# Patient Record
Sex: Female | Born: 1965 | Race: White | Hispanic: No | Smoking: Current every day smoker
Health system: Southern US, Community
[De-identification: ages and names within clinical notes are randomized; demographics above are authoritative.]

## PROBLEM LIST (undated history)

## (undated) DIAGNOSIS — F0280 Dementia in other diseases classified elsewhere without behavioral disturbance: Secondary | ICD-10-CM

## (undated) DIAGNOSIS — J302 Other seasonal allergic rhinitis: Secondary | ICD-10-CM

## (undated) DIAGNOSIS — I10 Essential (primary) hypertension: Secondary | ICD-10-CM

## (undated) DIAGNOSIS — N189 Chronic kidney disease, unspecified: Secondary | ICD-10-CM

## (undated) DIAGNOSIS — F419 Anxiety disorder, unspecified: Secondary | ICD-10-CM

## (undated) DIAGNOSIS — R0600 Dyspnea, unspecified: Secondary | ICD-10-CM

## (undated) DIAGNOSIS — E785 Hyperlipidemia, unspecified: Secondary | ICD-10-CM

## (undated) DIAGNOSIS — F319 Bipolar disorder, unspecified: Secondary | ICD-10-CM

## (undated) DIAGNOSIS — F329 Major depressive disorder, single episode, unspecified: Secondary | ICD-10-CM

## (undated) DIAGNOSIS — E119 Type 2 diabetes mellitus without complications: Secondary | ICD-10-CM

## (undated) DIAGNOSIS — C801 Malignant (primary) neoplasm, unspecified: Secondary | ICD-10-CM

## (undated) DIAGNOSIS — J45909 Unspecified asthma, uncomplicated: Secondary | ICD-10-CM

## (undated) DIAGNOSIS — F32A Depression, unspecified: Secondary | ICD-10-CM

## (undated) DIAGNOSIS — U071 COVID-19: Secondary | ICD-10-CM

## (undated) DIAGNOSIS — G912 (Idiopathic) normal pressure hydrocephalus: Secondary | ICD-10-CM

## (undated) HISTORY — PX: BREAST EXCISIONAL BIOPSY: SUR124

## (undated) HISTORY — PX: BREAST SURGERY: SHX581

---

## 1998-05-16 ENCOUNTER — Other Ambulatory Visit: Admission: RE | Admit: 1998-05-16 | Discharge: 1998-05-16 | Payer: Self-pay | Admitting: Obstetrics & Gynecology

## 1999-05-01 ENCOUNTER — Other Ambulatory Visit: Admission: RE | Admit: 1999-05-01 | Discharge: 1999-05-01 | Payer: Self-pay | Admitting: Obstetrics & Gynecology

## 2001-08-16 ENCOUNTER — Emergency Department (HOSPITAL_COMMUNITY): Admission: EM | Admit: 2001-08-16 | Discharge: 2001-08-16 | Payer: Self-pay | Admitting: *Deleted

## 2002-01-06 ENCOUNTER — Encounter: Payer: Self-pay | Admitting: Emergency Medicine

## 2002-01-06 ENCOUNTER — Emergency Department (HOSPITAL_COMMUNITY): Admission: EM | Admit: 2002-01-06 | Discharge: 2002-01-06 | Payer: Self-pay | Admitting: Emergency Medicine

## 2002-02-16 ENCOUNTER — Emergency Department (HOSPITAL_COMMUNITY): Admission: EM | Admit: 2002-02-16 | Discharge: 2002-02-16 | Payer: Self-pay | Admitting: Emergency Medicine

## 2002-02-16 ENCOUNTER — Encounter: Payer: Self-pay | Admitting: Emergency Medicine

## 2002-02-24 ENCOUNTER — Encounter: Payer: Self-pay | Admitting: Emergency Medicine

## 2002-02-24 ENCOUNTER — Emergency Department (HOSPITAL_COMMUNITY): Admission: EM | Admit: 2002-02-24 | Discharge: 2002-02-24 | Payer: Self-pay | Admitting: Emergency Medicine

## 2002-12-18 ENCOUNTER — Emergency Department (HOSPITAL_COMMUNITY): Admission: EM | Admit: 2002-12-18 | Discharge: 2002-12-18 | Payer: Self-pay | Admitting: Emergency Medicine

## 2003-07-12 ENCOUNTER — Emergency Department (HOSPITAL_COMMUNITY): Admission: EM | Admit: 2003-07-12 | Discharge: 2003-07-12 | Payer: Self-pay | Admitting: Emergency Medicine

## 2004-05-14 ENCOUNTER — Emergency Department (HOSPITAL_COMMUNITY): Admission: EM | Admit: 2004-05-14 | Discharge: 2004-05-14 | Payer: Self-pay | Admitting: Emergency Medicine

## 2004-08-27 ENCOUNTER — Emergency Department (HOSPITAL_COMMUNITY): Admission: EM | Admit: 2004-08-27 | Discharge: 2004-08-27 | Payer: Self-pay | Admitting: Emergency Medicine

## 2004-10-23 ENCOUNTER — Emergency Department (HOSPITAL_COMMUNITY): Admission: EM | Admit: 2004-10-23 | Discharge: 2004-10-23 | Payer: Self-pay | Admitting: Family Medicine

## 2004-10-31 ENCOUNTER — Other Ambulatory Visit: Admission: RE | Admit: 2004-10-31 | Discharge: 2004-10-31 | Payer: Self-pay | Admitting: Obstetrics & Gynecology

## 2004-11-06 ENCOUNTER — Encounter: Admission: RE | Admit: 2004-11-06 | Discharge: 2004-11-06 | Payer: Self-pay | Admitting: Obstetrics & Gynecology

## 2004-11-12 ENCOUNTER — Encounter: Admission: RE | Admit: 2004-11-12 | Discharge: 2004-11-12 | Payer: Self-pay | Admitting: Obstetrics & Gynecology

## 2005-08-12 ENCOUNTER — Other Ambulatory Visit: Admission: RE | Admit: 2005-08-12 | Discharge: 2005-08-12 | Payer: Self-pay | Admitting: Obstetrics & Gynecology

## 2005-08-13 ENCOUNTER — Encounter: Admission: RE | Admit: 2005-08-13 | Discharge: 2005-08-13 | Payer: Self-pay | Admitting: Obstetrics & Gynecology

## 2005-08-13 ENCOUNTER — Encounter: Admission: RE | Admit: 2005-08-13 | Discharge: 2005-08-13 | Payer: Self-pay | Admitting: Allergy and Immunology

## 2006-01-05 ENCOUNTER — Encounter: Admission: RE | Admit: 2006-01-05 | Discharge: 2006-01-05 | Payer: Self-pay | Admitting: Obstetrics & Gynecology

## 2006-01-14 ENCOUNTER — Emergency Department (HOSPITAL_COMMUNITY): Admission: EM | Admit: 2006-01-14 | Discharge: 2006-01-15 | Payer: Self-pay | Admitting: Emergency Medicine

## 2006-04-28 ENCOUNTER — Encounter: Admission: RE | Admit: 2006-04-28 | Discharge: 2006-04-28 | Payer: Self-pay | Admitting: Orthopedic Surgery

## 2006-05-20 ENCOUNTER — Encounter: Admission: RE | Admit: 2006-05-20 | Discharge: 2006-05-20 | Payer: Self-pay | Admitting: Orthopedic Surgery

## 2006-08-19 ENCOUNTER — Encounter: Admission: RE | Admit: 2006-08-19 | Discharge: 2006-08-19 | Payer: Self-pay | Admitting: *Deleted

## 2007-02-10 ENCOUNTER — Encounter: Admission: RE | Admit: 2007-02-10 | Discharge: 2007-02-10 | Payer: Self-pay | Admitting: Obstetrics & Gynecology

## 2007-07-27 ENCOUNTER — Emergency Department (HOSPITAL_COMMUNITY): Admission: EM | Admit: 2007-07-27 | Discharge: 2007-07-27 | Payer: Self-pay | Admitting: Emergency Medicine

## 2008-02-13 ENCOUNTER — Encounter: Admission: RE | Admit: 2008-02-13 | Discharge: 2008-02-13 | Payer: Self-pay | Admitting: Obstetrics & Gynecology

## 2008-04-18 ENCOUNTER — Encounter: Admission: RE | Admit: 2008-04-18 | Discharge: 2008-05-29 | Payer: Self-pay | Admitting: Endocrinology

## 2009-04-05 ENCOUNTER — Ambulatory Visit (HOSPITAL_COMMUNITY): Admission: RE | Admit: 2009-04-05 | Discharge: 2009-04-05 | Payer: Self-pay | Admitting: Obstetrics & Gynecology

## 2010-09-28 ENCOUNTER — Encounter: Payer: Self-pay | Admitting: Obstetrics & Gynecology

## 2010-12-18 ENCOUNTER — Other Ambulatory Visit: Payer: Self-pay | Admitting: *Deleted

## 2010-12-18 NOTE — Telephone Encounter (Signed)
Not a [facility] pt to return request to the [facility].

## 2012-03-09 ENCOUNTER — Other Ambulatory Visit: Payer: Self-pay | Admitting: *Deleted

## 2012-03-09 NOTE — Telephone Encounter (Signed)
Not an [facility] pt

## 2012-10-03 ENCOUNTER — Other Ambulatory Visit: Payer: Self-pay | Admitting: Obstetrics & Gynecology

## 2012-10-03 DIAGNOSIS — R928 Other abnormal and inconclusive findings on diagnostic imaging of breast: Secondary | ICD-10-CM

## 2012-10-13 ENCOUNTER — Ambulatory Visit
Admission: RE | Admit: 2012-10-13 | Discharge: 2012-10-13 | Disposition: A | Discharge status: Home / Self Care | Payer: BC Managed Care – PPO | Source: Ambulatory Visit | Attending: Obstetrics & Gynecology | Admitting: Obstetrics & Gynecology

## 2012-10-13 DIAGNOSIS — R928 Other abnormal and inconclusive findings on diagnostic imaging of breast: Secondary | ICD-10-CM

## 2013-03-29 ENCOUNTER — Emergency Department (HOSPITAL_COMMUNITY): Payer: BC Managed Care – PPO

## 2013-03-29 ENCOUNTER — Emergency Department (HOSPITAL_COMMUNITY)
Admission: EM | Admit: 2013-03-29 | Discharge: 2013-03-29 | Disposition: A | Discharge status: Home / Self Care | Payer: BC Managed Care – PPO | Attending: Emergency Medicine | Admitting: Emergency Medicine

## 2013-03-29 ENCOUNTER — Encounter (HOSPITAL_COMMUNITY): Payer: Self-pay | Admitting: Emergency Medicine

## 2013-03-29 DIAGNOSIS — F411 Generalized anxiety disorder: Secondary | ICD-10-CM | POA: Insufficient documentation

## 2013-03-29 DIAGNOSIS — F17200 Nicotine dependence, unspecified, uncomplicated: Secondary | ICD-10-CM | POA: Insufficient documentation

## 2013-03-29 DIAGNOSIS — R0602 Shortness of breath: Secondary | ICD-10-CM | POA: Insufficient documentation

## 2013-03-29 DIAGNOSIS — Z794 Long term (current) use of insulin: Secondary | ICD-10-CM | POA: Insufficient documentation

## 2013-03-29 DIAGNOSIS — E119 Type 2 diabetes mellitus without complications: Secondary | ICD-10-CM | POA: Insufficient documentation

## 2013-03-29 DIAGNOSIS — Z79899 Other long term (current) drug therapy: Secondary | ICD-10-CM | POA: Insufficient documentation

## 2013-03-29 DIAGNOSIS — R05 Cough: Secondary | ICD-10-CM | POA: Insufficient documentation

## 2013-03-29 DIAGNOSIS — R059 Cough, unspecified: Secondary | ICD-10-CM | POA: Insufficient documentation

## 2013-03-29 DIAGNOSIS — J4521 Mild intermittent asthma with (acute) exacerbation: Secondary | ICD-10-CM

## 2013-03-29 DIAGNOSIS — J45901 Unspecified asthma with (acute) exacerbation: Secondary | ICD-10-CM | POA: Insufficient documentation

## 2013-03-29 HISTORY — DX: Type 2 diabetes mellitus without complications: E11.9

## 2013-03-29 HISTORY — DX: Unspecified asthma, uncomplicated: J45.909

## 2013-03-29 HISTORY — DX: Other seasonal allergic rhinitis: J30.2

## 2013-03-29 MED ORDER — ALBUTEROL SULFATE HFA 108 (90 BASE) MCG/ACT IN AERS: 2.0000 | INHALATION_SPRAY | RESPIRATORY_TRACT | Status: DC | PRN

## 2013-03-29 MED ORDER — ALBUTEROL SULFATE (5 MG/ML) 0.5% IN NEBU
5.0000 mg | INHALATION_SOLUTION | Freq: Once | RESPIRATORY_TRACT | Status: AC
  Administered 2013-03-29: 5 mg via RESPIRATORY_TRACT
  Filled 2013-03-29: qty 1

## 2013-03-29 MED ORDER — ALBUTEROL SULFATE HFA 108 (90 BASE) MCG/ACT IN AERS
2.0000 | INHALATION_SPRAY | Freq: Once | RESPIRATORY_TRACT | Status: AC
  Administered 2013-03-29: 2 via RESPIRATORY_TRACT
  Filled 2013-03-29: qty 6.7

## 2013-03-29 NOTE — Discharge Instructions (Signed)
1. Take your inhaler as prescribed. 2. Follow up with Dr. Ronne Binning 3. SEEK IMMEDIATE MEDICAL CARE IF:  You are getting worse.  You have trouble breathing. If severe, call your local emergency services 911 in [place]  You develop chest pain or discomfort.  You are throwing up or not drinking fluids.  You are coughing up yellow, green, brown, or bloody sputum.  You have a fever or persistent symptoms for more than 2 3 days.  You have a fever and your symptoms suddenly get worse.  You have trouble swallowing.

## 2013-03-29 NOTE — ED Notes (Signed)
PT. REPORTS ASTHMA ATTACK ONSET 2 DAYS AGO AFTER RUNNING OUT OF ALBUTEROL MDI / OCCASIONAL PRODUCTIIVE COUGH.

## 2013-03-30 NOTE — ED Provider Notes (Signed)
CSN: [id number]     Arrival date & time 03/29/13  1934 History     First MD Initiated Contact with Patient 03/29/13 2113     Chief Complaint  Patient presents with  . Asthma   (Consider location/radiation/quality/duration/timing/severity/associated sxs/prior Treatment) HPI Comments: 47 y.o. Female with PMHx of asthma and DM2 presents today complaining about exacerbation of her asthma. This is a chronic problem for the pt.  Acute onset 2 days ago. Intermittent. Worse today after running out of her inhaler. Pt does have an appt with PCP on Friday for refills, but could not make it till then. Pt states this asthma attack as moderate. Was given albuterol tx prior to PE and states she is feeling much better.   Patient is a 47 y.o. female presenting with asthma.  Asthma Associated symptoms include coughing. Pertinent negatives include no arthralgias, change in bowel habit, chest pain, chills, diaphoresis, fever, headaches, myalgias, nausea, sore throat, urinary symptoms, visual change or vomiting. Exacerbated by: anxiety over running out of her inhaler. Treatments tried: inhaler but ran out. The treatment provided mild relief.    Past Medical History  Diagnosis Date  . Asthma   . Diabetes mellitus without complication   . Seasonal allergies    History reviewed. No pertinent past surgical history. No family history on file. History  Substance Use Topics  . Smoking status: Current Every Day Smoker  . Smokeless tobacco: Not on file  . Alcohol Use: No   OB History   Grav Para Term Preterm Abortions TAB SAB Ect Mult Living                 Review of Systems  Constitutional: Negative for fever, chills and diaphoresis.  HENT: Negative for sore throat.   Respiratory: Positive for cough, shortness of breath and wheezing.        Dry and wet  Cardiovascular: Negative for chest pain and palpitations.  Gastrointestinal: Negative for nausea, vomiting and change in bowel habit.   Musculoskeletal: Negative for myalgias and arthralgias.  Neurological: Negative for headaches.    Allergies  Erythromycin  Home Medications   Current Outpatient Rx  Name  Route  Sig  Dispense  Refill  . atorvastatin (LIPITOR) 20 MG tablet   Oral   Take 20 mg by mouth at bedtime.         . fexofenadine (ALLEGRA) 180 MG tablet   Oral   Take 180 mg by mouth daily.         . insulin glargine (LANTUS) 100 UNIT/ML injection   Subcutaneous   Inject 45 Units into the skin at bedtime.         . insulin lispro (HUMALOG) 100 UNIT/ML injection   Subcutaneous   Inject 4-35 Units into the skin 3 (three) times daily before meals. On a sliding scale         . montelukast (SINGULAIR) 10 MG tablet   Oral   Take 10 mg by mouth at bedtime.         . pantoprazole (PROTONIX) 40 MG tablet   Oral   Take 40 mg by mouth daily.         Marland Kitchen venlafaxine (EFFEXOR) 75 MG tablet   Oral   Take 75 mg by mouth 2 (two) times daily.         Marland Kitchen albuterol (PROVENTIL HFA;VENTOLIN HFA) 108 (90 BASE) MCG/ACT inhaler   Inhalation   Inhale 2 puffs into the lungs every 4 (four) hours as needed  for wheezing.   1 Inhaler   0    BP 133/67  Pulse 96  Temp(Src) 97.7 F (36.5 C) (Oral)  Resp 16  SpO2 98%  LMP 03/07/2013 Physical Exam  Nursing note and vitals reviewed. Constitutional: She is oriented to person, place, and time. She appears well-developed and well-nourished. No distress.  HENT:  Head: Normocephalic and atraumatic.  Eyes: Conjunctivae and EOM are normal.  Neck: Normal range of motion. Neck supple.  No meningeal signs  Cardiovascular: Normal rate, regular rhythm and normal heart sounds.  Exam reveals no gallop and no friction rub.   No murmur heard. Pulmonary/Chest: Effort normal and breath sounds normal. No respiratory distress. She has no wheezes. She has no rales. She exhibits no tenderness.  PE conducted after albuterol tx. Lungs CTA. Equal expansion.   Abdominal: Soft.  Bowel sounds are normal. She exhibits no distension. There is no tenderness. There is no rebound and no guarding.  Musculoskeletal: Normal range of motion. She exhibits no edema and no tenderness.  FROM to upper and lower extremities  Neurological: She is alert and oriented to person, place, and time. No cranial nerve deficit.  Speech is clear and goal oriented, follows commands Sensation normal to light touch and two point discrimination Moves extremities without ataxia, coordination intact Normal gait and balance Normal strength in upper and lower extremities bilaterally including dorsiflexion and plantar flexion, strong and equal grip strength   Skin: Skin is warm and dry. She is not diaphoretic. No erythema.  Psychiatric: She has a normal mood and affect.    ED Course   Procedures (including critical care time)  Labs Reviewed - No data to display Dg Chest 2 View  03/29/2013   *RADIOLOGY REPORT*  Clinical Data: Shortness of breath for 2 days.  History of asthma.  CHEST - 2 VIEW  Comparison: 01/24/2010  Findings: Normal heart size and pulmonary vascularity. Peribronchial thickening interstitial changes suggesting chronic bronchitis or reactive airways disease.  No focal consolidation or airspace disease in the lungs.  No blunting of costophrenic angles. No pneumothorax.  Mediastinal contours appear intact.  Old right rib fracture.  Mild degenerative changes in the spine.  Stable appearance since previous study.  IMPRESSION: Peribronchial changes and mild hyperinflation may represent asthma or chronic bronchitis.  No focal consolidation.  No significant change since prior study.   Original Report Authenticated By: Burman Nieves, M.D.   1. Asthma exacerbation, mild intermittent     MDM  Patient ambulated in ED with O2 saturations maintained >90, no current signs of respiratory distress. Lung exam improved after nebulizer treatment. Pt states they are breathing at baseline. Pt has been  instructed to continue using prescribed medications and to speak with PCP about today's exacerbation. Provided pt with script for inhaler. Discussed reasons to seek immediate care. Patient expresses understanding and agrees with plan.   Glade Nurse, PA-C 03/30/13 2240

## 2013-04-04 NOTE — ED Provider Notes (Signed)
Medical screening examination/treatment/procedure(s) were performed by non-physician practitioner and as supervising physician I was immediately available for consultation/collaboration.   Euel Castile III, MD 04/04/13 0922 

## 2013-05-04 ENCOUNTER — Ambulatory Visit: Payer: BC Managed Care – PPO | Admitting: *Deleted

## 2013-05-22 ENCOUNTER — Ambulatory Visit: Payer: BC Managed Care – PPO | Admitting: *Deleted

## 2013-06-19 ENCOUNTER — Ambulatory Visit: Payer: BC Managed Care – PPO | Admitting: *Deleted

## 2013-07-25 ENCOUNTER — Ambulatory Visit: Payer: BC Managed Care – PPO | Admitting: *Deleted

## 2014-03-01 ENCOUNTER — Encounter (HOSPITAL_COMMUNITY): Payer: Self-pay | Admitting: Emergency Medicine

## 2014-03-01 ENCOUNTER — Emergency Department (HOSPITAL_COMMUNITY)
Admission: EM | Admit: 2014-03-01 | Discharge: 2014-03-01 | Disposition: A | Discharge status: Home / Self Care | Payer: BC Managed Care – PPO | Attending: Emergency Medicine | Admitting: Emergency Medicine

## 2014-03-01 DIAGNOSIS — E119 Type 2 diabetes mellitus without complications: Secondary | ICD-10-CM | POA: Insufficient documentation

## 2014-03-01 DIAGNOSIS — Z79899 Other long term (current) drug therapy: Secondary | ICD-10-CM | POA: Insufficient documentation

## 2014-03-01 DIAGNOSIS — F17200 Nicotine dependence, unspecified, uncomplicated: Secondary | ICD-10-CM | POA: Insufficient documentation

## 2014-03-01 DIAGNOSIS — J45909 Unspecified asthma, uncomplicated: Secondary | ICD-10-CM | POA: Insufficient documentation

## 2014-03-01 DIAGNOSIS — J020 Streptococcal pharyngitis: Secondary | ICD-10-CM

## 2014-03-01 LAB — RAPID STREP SCREEN (MED CTR MEBANE ONLY): STREPTOCOCCUS, GROUP A SCREEN (DIRECT): POSITIVE — AB

## 2014-03-01 MED ORDER — HYDROCODONE-ACETAMINOPHEN 5-325 MG PO TABS
2.0000 | ORAL_TABLET | ORAL | Status: DC | PRN
Start: 2014-03-01 — End: 2014-07-21

## 2014-03-01 MED ORDER — PENICILLIN G BENZATHINE 1200000 UNIT/2ML IM SUSP
1.2000 10*6.[IU] | Freq: Once | INTRAMUSCULAR | Status: AC
  Administered 2014-03-01: 1.2 10*6.[IU] via INTRAMUSCULAR
  Filled 2014-03-01: qty 2

## 2014-03-01 MED ORDER — HYDROCODONE-ACETAMINOPHEN 5-325 MG PO TABS
2.0000 | ORAL_TABLET | Freq: Once | ORAL | Status: AC
  Administered 2014-03-01: 2 via ORAL
  Filled 2014-03-01: qty 2

## 2014-03-01 NOTE — Discharge Instructions (Signed)
You have been treated for strep throat here. Take Vicodin as needed for pain. Refer to attached documents for more information.

## 2014-03-01 NOTE — ED Notes (Signed)
Pt states that close friend of hers recently had strep throat 4 days ago. Pt states that her throat began hurting yesterday. Pt has been using tylenol, ibuprophen goodies powders, cough drops, and salt water gargling with minimal and brief relief.

## 2014-03-01 NOTE — ED Provider Notes (Signed)
CSN: [id number]     Arrival date & time 03/01/14  1438 History   First MD Initiated Contact with Patient 03/01/14 1520     Chief Complaint  Patient presents with  . Sore Throat     (Consider location/radiation/quality/duration/timing/severity/associated sxs/prior Treatment) HPI Comments: Patient is a 48 year old female who presents with a 4 day history of sore throat. Patient reports gradual onset and progressively worsening sharp, severe throat pain. The pain is constant and made worse with swallowing. The pain is localized to the patient's throat and equal on both sides. Nothing alleviates the pain. The patient has tried OTC medications without relief. Patient reports associated subjective fever and cervical adenopathy. Patient denies headache, visual changes, sinus congestion, difficulty breathing, chest pain, SOB, abdominal pain, NVD. Patient reports a close friend recently being diagnosed with strep throat.      Past Medical History  Diagnosis Date  . Asthma   . Diabetes mellitus without complication   . Seasonal allergies    History reviewed. No pertinent past surgical history. History reviewed. No pertinent family history. History  Substance Use Topics  . Smoking status: Current Every Day Smoker  . Smokeless tobacco: Not on file  . Alcohol Use: No   OB History   Grav Para Term Preterm Abortions TAB SAB Ect Mult Living                 Review of Systems  Constitutional: Positive for fever. Negative for chills and fatigue.  HENT: Positive for sore throat. Negative for trouble swallowing.   Eyes: Negative for visual disturbance.  Respiratory: Negative for shortness of breath.   Cardiovascular: Negative for chest pain and palpitations.  Gastrointestinal: Negative for nausea, vomiting, abdominal pain and diarrhea.  Genitourinary: Negative for dysuria and difficulty urinating.  Musculoskeletal: Negative for arthralgias and neck pain.  Skin: Negative for color change.   Neurological: Negative for dizziness and weakness.  Psychiatric/Behavioral: Negative for dysphoric mood.      Allergies  Erythromycin  Home Medications   Prior to Admission medications   Medication Sig Start Date End Date Taking? Authorizing Provider  albuterol (PROVENTIL HFA;VENTOLIN HFA) 108 (90 BASE) MCG/ACT inhaler Inhale 2 puffs into the lungs every 4 (four) hours as needed for wheezing. 03/29/13  Yes Glade NurseBarbara Beck, PA-C  atorvastatin (LIPITOR) 20 MG tablet Take 20 mg by mouth at bedtime.   Yes Historical Provider, MD  insulin glargine (LANTUS) 100 UNIT/ML injection Inject 50 Units into the skin at bedtime.   Yes Historical Provider, MD  insulin lispro (HUMALOG) 100 UNIT/ML injection Inject 2-40 Units into the skin 2 (two) times daily. On a sliding scale   Yes Historical Provider, MD  montelukast (SINGULAIR) 10 MG tablet Take 10 mg by mouth at bedtime.   Yes Historical Provider, MD  pantoprazole (PROTONIX) 40 MG tablet Take 40 mg by mouth at bedtime.    Yes Historical Provider, MD   BP 117/82  Pulse 103  Temp(Src) 97.9 F (36.6 C) (Oral)  SpO2 96% Physical Exam  Nursing note and vitals reviewed. Constitutional: She appears well-developed and well-nourished. No distress.  HENT:  Head: Normocephalic and atraumatic.  Mouth/Throat: Oropharyngeal exudate present.  Bilateral tonsillar edema and erythema with associated exudate.   Eyes: Conjunctivae and EOM are normal.  Neck: Normal range of motion.  Cardiovascular: Normal rate and regular rhythm.  Exam reveals no gallop and no friction rub.   No murmur heard. Pulmonary/Chest: Effort normal and breath sounds normal. She has no wheezes. She  has no rales. She exhibits no tenderness.  Musculoskeletal: Normal range of motion.  Lymphadenopathy:    She has cervical adenopathy.  Neurological: She is alert.  Speech is goal-oriented. Moves limbs without ataxia.   Skin: Skin is warm and dry.  Psychiatric: She has a normal mood and  affect. Her behavior is normal.    ED Course  Procedures (including critical care time) Labs Review Labs Reviewed  RAPID STREP SCREEN - Abnormal; Notable for the following:    Streptococcus, Group A Screen (Direct) POSITIVE (*)    All other components within normal limits    Imaging Review No results found.   EKG Interpretation None      MDM   Final diagnoses:  Strep throat    3:45 PM Rapid strep pending. Patient is mildly tachycardic likely due to infection. Patient will have vicodin for pain.   4:06 PM Patient has positive strep test and will be treated with Pen G. Patient will have vicodin for pain. No further evaluation needed at this time.     Emilia BeckKaitlyn Szekalski, PA-C 03/01/14 1606

## 2014-03-01 NOTE — ED Notes (Signed)
Refused WC. Escorted by RN to front entrance

## 2014-03-07 NOTE — ED Provider Notes (Signed)
Medical screening examination/treatment/procedure(s) were performed by non-physician practitioner and as supervising physician I was immediately available for consultation/collaboration.   EKG Interpretation None        Mark James, MD 03/07/14 1602 

## 2014-07-20 ENCOUNTER — Emergency Department (HOSPITAL_COMMUNITY)
Admission: EM | Admit: 2014-07-20 | Discharge: 2014-07-21 | Disposition: A | Discharge status: Home / Self Care | Payer: Self-pay | Attending: Emergency Medicine | Admitting: Emergency Medicine

## 2014-07-20 ENCOUNTER — Encounter (HOSPITAL_COMMUNITY): Payer: Self-pay | Admitting: *Deleted

## 2014-07-20 DIAGNOSIS — R079 Chest pain, unspecified: Secondary | ICD-10-CM

## 2014-07-20 DIAGNOSIS — E119 Type 2 diabetes mellitus without complications: Secondary | ICD-10-CM | POA: Insufficient documentation

## 2014-07-20 DIAGNOSIS — R0789 Other chest pain: Secondary | ICD-10-CM | POA: Insufficient documentation

## 2014-07-20 DIAGNOSIS — Z794 Long term (current) use of insulin: Secondary | ICD-10-CM | POA: Insufficient documentation

## 2014-07-20 DIAGNOSIS — Z79899 Other long term (current) drug therapy: Secondary | ICD-10-CM | POA: Insufficient documentation

## 2014-07-20 DIAGNOSIS — J40 Bronchitis, not specified as acute or chronic: Secondary | ICD-10-CM

## 2014-07-20 DIAGNOSIS — Z720 Tobacco use: Secondary | ICD-10-CM | POA: Insufficient documentation

## 2014-07-20 DIAGNOSIS — J45901 Unspecified asthma with (acute) exacerbation: Secondary | ICD-10-CM | POA: Insufficient documentation

## 2014-07-20 LAB — URINALYSIS, ROUTINE W REFLEX MICROSCOPIC
Bilirubin Urine: NEGATIVE
HGB URINE DIPSTICK: NEGATIVE
Ketones, ur: NEGATIVE mg/dL
LEUKOCYTES UA: NEGATIVE
Nitrite: NEGATIVE
Protein, ur: NEGATIVE mg/dL
SPECIFIC GRAVITY, URINE: 1.019 (ref 1.005–1.030)
Urobilinogen, UA: 0.2 mg/dL (ref 0.0–1.0)
pH: 6 (ref 5.0–8.0)

## 2014-07-20 LAB — COMPREHENSIVE METABOLIC PANEL
ALBUMIN: 3.4 g/dL — AB (ref 3.5–5.2)
ALK PHOS: 113 U/L (ref 39–117)
ALT: 9 U/L (ref 0–35)
AST: 9 U/L (ref 0–37)
Anion gap: 14 (ref 5–15)
BUN: 6 mg/dL (ref 6–23)
CO2: 25 mEq/L (ref 19–32)
Calcium: 9.4 mg/dL (ref 8.4–10.5)
Chloride: 97 mEq/L (ref 96–112)
Creatinine, Ser: 0.93 mg/dL (ref 0.50–1.10)
GFR calc Af Amer: 84 mL/min — ABNORMAL LOW (ref >90)
GFR calc non Af Amer: 73 mL/min — ABNORMAL LOW (ref >90)
Glucose, Bld: 291 mg/dL — ABNORMAL HIGH (ref 70–99)
POTASSIUM: 4.5 meq/L (ref 3.7–5.3)
Sodium: 136 mEq/L — ABNORMAL LOW (ref 137–147)
Total Bilirubin: <0.2 mg/dL — ABNORMAL LOW (ref 0.3–1.2)
Total Protein: 7.3 g/dL (ref 6.0–8.3)

## 2014-07-20 LAB — CBC WITH DIFFERENTIAL/PLATELET
BASOS ABS: 0 10*3/uL (ref 0.0–0.1)
BASOS PCT: 0 % (ref 0–1)
Eosinophils Absolute: 0.3 10*3/uL (ref 0.0–0.7)
Eosinophils Relative: 2 % (ref 0–5)
HCT: 40.2 % (ref 36.0–46.0)
HEMOGLOBIN: 12.8 g/dL (ref 12.0–15.0)
Lymphocytes Relative: 23 % (ref 12–46)
Lymphs Abs: 2.9 10*3/uL (ref 0.7–4.0)
MCH: 25 pg — ABNORMAL LOW (ref 26.0–34.0)
MCHC: 31.8 g/dL (ref 30.0–36.0)
MCV: 78.4 fL (ref 78.0–100.0)
Monocytes Absolute: 0.9 10*3/uL (ref 0.1–1.0)
Monocytes Relative: 7 % (ref 3–12)
NEUTROS ABS: 8.4 10*3/uL — AB (ref 1.7–7.7)
NEUTROS PCT: 68 % (ref 43–77)
Platelets: 408 10*3/uL — ABNORMAL HIGH (ref 150–400)
RBC: 5.13 MIL/uL — ABNORMAL HIGH (ref 3.87–5.11)
RDW: 14.9 % (ref 11.5–15.5)
WBC: 12.5 10*3/uL — ABNORMAL HIGH (ref 4.0–10.5)

## 2014-07-20 LAB — URINE MICROSCOPIC-ADD ON

## 2014-07-20 LAB — LIPASE, BLOOD: LIPASE: 14 U/L (ref 11–59)

## 2014-07-20 LAB — PREGNANCY, URINE: Preg Test, Ur: NEGATIVE

## 2014-07-20 MED ORDER — SODIUM CHLORIDE 0.9 % IV BOLUS (SEPSIS)
1000.0000 mL | Freq: Once | INTRAVENOUS | Status: AC
Start: 2014-07-20 — End: 2014-07-21
  Administered 2014-07-20: 1000 mL via INTRAVENOUS

## 2014-07-20 MED ORDER — MORPHINE SULFATE 4 MG/ML IJ SOLN
4.0000 mg | Freq: Once | INTRAMUSCULAR | Status: AC
  Administered 2014-07-20: 4 mg via INTRAVENOUS
  Filled 2014-07-20: qty 1

## 2014-07-20 MED ORDER — IPRATROPIUM-ALBUTEROL 0.5-2.5 (3) MG/3ML IN SOLN
3.0000 mL | Freq: Once | RESPIRATORY_TRACT | Status: AC
  Administered 2014-07-20: 3 mL via RESPIRATORY_TRACT
  Filled 2014-07-20: qty 3

## 2014-07-20 NOTE — ED Notes (Signed)
Pt c/o RUQ abdominal pain for the past couple of days. Pt denies any recent falls or injury to that side. Pt is a 1/2 PPD smoker. Pt reports pain increases with movement and deep breaths. Pt states she is using her inhaler and motrin for the pain. Pt denies ETOH use.

## 2014-07-20 NOTE — ED Provider Notes (Signed)
CSN: [id number]     Arrival date & time 07/20/14  2006 History   First MD Initiated Contact with Patient 07/20/14 2247     Chief Complaint  Patient presents with  . Abdominal Pain     (Consider location/radiation/quality/duration/timing/severity/associated sxs/prior Treatment) HPI  48 year old female presents with 3 days of right-sided chest pain. She feels like she suffered a muscle strain as a started the day after picking up her nephew. She states her nephew is large. She's not remember any specific pain when she picked him up. Has been having a cough with intermittent sputum for the past week. Patient is a smoker. Has had some shortness of breath. Denies any pain with eating. No abdominal pain. She feels like it is on her right rib. No pleuritic symptoms. Hurts worse with moving and coughing. Patient has tried Midol, Vicks VapoRub, and BenGay with no relief.  Past Medical History  Diagnosis Date  . Asthma   . Diabetes mellitus without complication   . Seasonal allergies    History reviewed. No pertinent past surgical history. History reviewed. No pertinent family history. History  Substance Use Topics  . Smoking status: Current Every Day Smoker -- 0.50 packs/day    Types: Cigarettes  . Smokeless tobacco: Not on file  . Alcohol Use: No   OB History    No data available     Review of Systems  Constitutional: Negative for fever.  Respiratory: Positive for cough and shortness of breath.   Cardiovascular: Positive for chest pain. Negative for leg swelling.  Gastrointestinal: Negative for nausea, vomiting and abdominal pain.  Musculoskeletal: Negative for back pain.  All other systems reviewed and are negative.     Allergies  Erythromycin  Home Medications   Prior to Admission medications   Medication Sig Start Date End Date Taking? Authorizing Provider  albuterol (PROVENTIL HFA;VENTOLIN HFA) 108 (90 BASE) MCG/ACT inhaler Inhale 2 puffs into the lungs every 4 (four)  hours as needed for wheezing. 03/29/13  Yes Glade NurseBarbara Beck, PA-C  esomeprazole (NEXIUM) 40 MG capsule Take 40 mg by mouth daily at 12 noon.   Yes Historical Provider, MD  insulin glargine (LANTUS) 100 UNIT/ML injection Inject 50 Units into the skin at bedtime.   Yes Historical Provider, MD  insulin lispro (HUMALOG) 100 UNIT/ML injection Inject 2-40 Units into the skin 2 (two) times daily. On a sliding scale   Yes Historical Provider, MD  lisinopril (PRINIVIL,ZESTRIL) 10 MG tablet Take 10 mg by mouth daily.   Yes Historical Provider, MD  montelukast (SINGULAIR) 10 MG tablet Take 10 mg by mouth at bedtime.   Yes Historical Provider, MD  rosuvastatin (CRESTOR) 10 MG tablet Take 10 mg by mouth at bedtime.   Yes Historical Provider, MD  venlafaxine (EFFEXOR) 75 MG tablet Take 75 mg by mouth 3 (three) times daily with meals.   Yes Historical Provider, MD  HYDROcodone-acetaminophen (NORCO/VICODIN) 5-325 MG per tablet Take 2 tablets by mouth every 4 (four) hours as needed for moderate pain or severe pain. Patient not taking: Reported on 07/20/2014 03/01/14   Emilia BeckKaitlyn Szekalski, PA-C   BP 179/82 mmHg  Pulse 105  Temp(Src) 98.2 F (36.8 C)  Resp 20  SpO2 97%  LMP 07/08/2014 (Approximate) Physical Exam  Constitutional: She is oriented to person, place, and time. She appears well-developed and well-nourished. No distress.  HENT:  Head: Normocephalic and atraumatic.  Right Ear: External ear normal.  Left Ear: External ear normal.  Nose: Nose normal.  Eyes: Right eye  exhibits no discharge. Left eye exhibits no discharge.  Cardiovascular: Normal rate, regular rhythm and normal heart sounds.   Pulmonary/Chest: Effort normal. She has wheezes. She exhibits tenderness (right sided chest wall tenderness under right breast).  Abdominal: Soft. She exhibits no distension. There is no tenderness.  Neurological: She is alert and oriented to person, place, and time.  Skin: Skin is warm and dry. She is not  diaphoretic.  Nursing note and vitals reviewed.   ED Course  Procedures (including critical care time) Labs Review Labs Reviewed  CBC WITH DIFFERENTIAL - Abnormal; Notable for the following:    WBC 12.5 (*)    RBC 5.13 (*)    MCH 25.0 (*)    Platelets 408 (*)    Neutro Abs 8.4 (*)    All other components within normal limits  COMPREHENSIVE METABOLIC PANEL - Abnormal; Notable for the following:    Sodium 136 (*)    Glucose, Bld 291 (*)    Albumin 3.4 (*)    Total Bilirubin <0.2 (*)    GFR calc non Af Amer 73 (*)    GFR calc Af Amer 84 (*)    All other components within normal limits  URINALYSIS, ROUTINE W REFLEX MICROSCOPIC - Abnormal; Notable for the following:    Glucose, UA >1000 (*)    All other components within normal limits  URINE MICROSCOPIC-ADD ON - Abnormal; Notable for the following:    Squamous Epithelial / LPF FEW (*)    All other components within normal limits  LIPASE, BLOOD  PREGNANCY, URINE    Imaging Review Dg Chest 2 View  07/21/2014   CLINICAL DATA:  Pain under right breast.  Shortness of breath.  EXAM: CHEST  2 VIEW  COMPARISON:  03/29/2013  FINDINGS: The heart size and mediastinal contours are within normal limits. Both lungs are clear. Old right posterior eighth rib fracture.  IMPRESSION: No active cardiopulmonary disease.   Electronically Signed   By: Elige KoHetal  Patel   On: 07/21/2014 00:45     EKG Interpretation None      MDM   Final diagnoses:  Chest pain  Acute chest wall pain  Bronchitis    Patient's pain appears most c/w chest wall pain/contusion. Could be strain from lifting her nephew earlier. Doubt gallbladder given location of pain. Given her cough/bronchitis I have low suspicion for PE. Will treat pain symptomatically and d/c with albuterol.    Audree CamelScott T Shynia Daleo, MD 07/21/14 [phone]

## 2014-07-21 ENCOUNTER — Emergency Department (HOSPITAL_COMMUNITY): Payer: BC Managed Care – PPO

## 2014-07-21 MED ORDER — ALBUTEROL SULFATE HFA 108 (90 BASE) MCG/ACT IN AERS
2.0000 | INHALATION_SPRAY | Freq: Once | RESPIRATORY_TRACT | Status: AC
  Administered 2014-07-21: 2 via RESPIRATORY_TRACT
  Filled 2014-07-21: qty 6.7

## 2014-07-21 MED ORDER — HYDROCODONE-ACETAMINOPHEN 5-325 MG PO TABS: 1.0000 | ORAL_TABLET | Freq: Four times a day (QID) | ORAL | Status: DC | PRN

## 2014-07-21 NOTE — Discharge Instructions (Signed)

## 2014-07-21 NOTE — ED Notes (Signed)
Pt to xray

## 2014-07-21 NOTE — ED Notes (Signed)
Patient transported to X-ray 

## 2014-08-19 ENCOUNTER — Emergency Department (HOSPITAL_COMMUNITY): Payer: BC Managed Care – PPO

## 2014-08-19 ENCOUNTER — Emergency Department (HOSPITAL_COMMUNITY)
Admission: EM | Admit: 2014-08-19 | Discharge: 2014-08-19 | Disposition: A | Discharge status: Home / Self Care | Payer: BC Managed Care – PPO | Attending: Emergency Medicine | Admitting: Emergency Medicine

## 2014-08-19 ENCOUNTER — Encounter (HOSPITAL_COMMUNITY): Payer: Self-pay | Admitting: *Deleted

## 2014-08-19 DIAGNOSIS — Z79899 Other long term (current) drug therapy: Secondary | ICD-10-CM | POA: Insufficient documentation

## 2014-08-19 DIAGNOSIS — J159 Unspecified bacterial pneumonia: Secondary | ICD-10-CM | POA: Insufficient documentation

## 2014-08-19 DIAGNOSIS — Z794 Long term (current) use of insulin: Secondary | ICD-10-CM | POA: Insufficient documentation

## 2014-08-19 DIAGNOSIS — Z720 Tobacco use: Secondary | ICD-10-CM | POA: Insufficient documentation

## 2014-08-19 DIAGNOSIS — J45901 Unspecified asthma with (acute) exacerbation: Secondary | ICD-10-CM | POA: Insufficient documentation

## 2014-08-19 DIAGNOSIS — E119 Type 2 diabetes mellitus without complications: Secondary | ICD-10-CM | POA: Insufficient documentation

## 2014-08-19 DIAGNOSIS — R079 Chest pain, unspecified: Secondary | ICD-10-CM

## 2014-08-19 DIAGNOSIS — J189 Pneumonia, unspecified organism: Secondary | ICD-10-CM

## 2014-08-19 LAB — CBC WITH DIFFERENTIAL/PLATELET
BASOS ABS: 0 10*3/uL (ref 0.0–0.1)
BASOS PCT: 0 % (ref 0–1)
EOS ABS: 0.2 10*3/uL (ref 0.0–0.7)
Eosinophils Relative: 1 % (ref 0–5)
HCT: 40.5 % (ref 36.0–46.0)
Hemoglobin: 13.1 g/dL (ref 12.0–15.0)
LYMPHS ABS: 3.2 10*3/uL (ref 0.7–4.0)
Lymphocytes Relative: 17 % (ref 12–46)
MCH: 25.8 pg — AB (ref 26.0–34.0)
MCHC: 32.3 g/dL (ref 30.0–36.0)
MCV: 79.7 fL (ref 78.0–100.0)
Monocytes Absolute: 1.6 10*3/uL — ABNORMAL HIGH (ref 0.1–1.0)
Monocytes Relative: 9 % (ref 3–12)
NEUTROS PCT: 73 % (ref 43–77)
Neutro Abs: 14.1 10*3/uL — ABNORMAL HIGH (ref 1.7–7.7)
Platelets: 387 10*3/uL (ref 150–400)
RBC: 5.08 MIL/uL (ref 3.87–5.11)
RDW: 15.5 % (ref 11.5–15.5)
WBC: 19.1 10*3/uL — ABNORMAL HIGH (ref 4.0–10.5)

## 2014-08-19 LAB — COMPREHENSIVE METABOLIC PANEL
ALBUMIN: 3.3 g/dL — AB (ref 3.5–5.2)
ALT: 12 U/L (ref 0–35)
AST: 14 U/L (ref 0–37)
Alkaline Phosphatase: 101 U/L (ref 39–117)
Anion gap: 13 (ref 5–15)
BUN: 10 mg/dL (ref 6–23)
CALCIUM: 9.3 mg/dL (ref 8.4–10.5)
CO2: 27 mEq/L (ref 19–32)
Chloride: 93 mEq/L — ABNORMAL LOW (ref 96–112)
Creatinine, Ser: 1.03 mg/dL (ref 0.50–1.10)
GFR calc Af Amer: 74 mL/min — ABNORMAL LOW (ref >90)
GFR calc non Af Amer: 64 mL/min — ABNORMAL LOW (ref >90)
GLUCOSE: 246 mg/dL — AB (ref 70–99)
Potassium: 4.8 mEq/L (ref 3.7–5.3)
SODIUM: 133 meq/L — AB (ref 137–147)
TOTAL PROTEIN: 7.1 g/dL (ref 6.0–8.3)
Total Bilirubin: <0.2 mg/dL — ABNORMAL LOW (ref 0.3–1.2)

## 2014-08-19 MED ORDER — KETOROLAC TROMETHAMINE 30 MG/ML IJ SOLN
15.0000 mg | Freq: Once | INTRAMUSCULAR | Status: AC
  Administered 2014-08-19: 15 mg via INTRAVENOUS
  Filled 2014-08-19: qty 1

## 2014-08-19 MED ORDER — HYDROCODONE-ACETAMINOPHEN 5-325 MG PO TABS: 2.0000 | ORAL_TABLET | ORAL | Status: DC | PRN

## 2014-08-19 MED ORDER — DEXTROSE 5 % IV SOLN
1.0000 g | Freq: Once | INTRAVENOUS | Status: AC
  Administered 2014-08-19: 1 g via INTRAVENOUS
  Filled 2014-08-19: qty 10

## 2014-08-19 MED ORDER — ALBUTEROL SULFATE (2.5 MG/3ML) 0.083% IN NEBU
5.0000 mg | INHALATION_SOLUTION | Freq: Once | RESPIRATORY_TRACT | Status: AC
  Administered 2014-08-19: 5 mg via RESPIRATORY_TRACT
  Filled 2014-08-19: qty 6

## 2014-08-19 MED ORDER — FENTANYL CITRATE 0.05 MG/ML IJ SOLN
50.0000 ug | Freq: Once | INTRAMUSCULAR | Status: AC
  Administered 2014-08-19: 50 ug via INTRAVENOUS
  Filled 2014-08-19: qty 2

## 2014-08-19 MED ORDER — DOXYCYCLINE HYCLATE 100 MG PO CAPS: 100.0000 mg | ORAL_CAPSULE | Freq: Two times a day (BID) | ORAL | Status: DC

## 2014-08-19 NOTE — ED Notes (Signed)
Pt reports having productive cough with green sputum which is causing right rib pain when she coughs and sob. Airway intact at triage, ekg done.

## 2014-08-27 NOTE — ED Provider Notes (Signed)
CSN: [id number]     Arrival date & time 08/19/14  1630 History   First MD Initiated Contact with Patient 08/19/14 1754     Chief Complaint  Patient presents with  . Shortness of Breath  . Cough      HPI Pt reports having productive cough with green sputum which is causing right rib pain when she coughs and sob.. Patient has history of seasonal allergies and diabetes.  No history of fever or chills. Past Medical History  Diagnosis Date  . Asthma   . Diabetes mellitus without complication   . Seasonal allergies    History reviewed. No pertinent past surgical history. History reviewed. No pertinent family history. History  Substance Use Topics  . Smoking status: Current Every Day Smoker -- 0.50 packs/day    Types: Cigarettes  . Smokeless tobacco: Not on file  . Alcohol Use: No   OB History    No data available     Review of Systems All other systems reviewed and are negative   Allergies  Erythromycin  Home Medications   Prior to Admission medications   Medication Sig Start Date End Date Taking? Authorizing Provider  albuterol (PROVENTIL HFA;VENTOLIN HFA) 108 (90 BASE) MCG/ACT inhaler Inhale 2 puffs into the lungs every 4 (four) hours as needed for wheezing. 03/29/13  Yes Glade NurseBarbara Beck, PA-C  esomeprazole (NEXIUM) 40 MG capsule Take 40 mg by mouth daily at 12 noon.   Yes Historical Provider, MD  insulin glargine (LANTUS) 100 UNIT/ML injection Inject 50 Units into the skin at bedtime.   Yes Historical Provider, MD  insulin lispro (HUMALOG) 100 UNIT/ML injection Inject 4-20 Units into the skin 3 (three) times daily with meals. On a sliding scale   Yes Historical Provider, MD  lisinopril (PRINIVIL,ZESTRIL) 10 MG tablet Take 10 mg by mouth daily.   Yes Historical Provider, MD  montelukast (SINGULAIR) 10 MG tablet Take 10 mg by mouth at bedtime.   Yes Historical Provider, MD  rosuvastatin (CRESTOR) 10 MG tablet Take 10 mg by mouth at bedtime.   Yes Historical Provider, MD   venlafaxine (EFFEXOR) 75 MG tablet Take 75 mg by mouth 3 (three) times daily with meals.   Yes Historical Provider, MD  doxycycline (VIBRAMYCIN) 100 MG capsule Take 1 capsule (100 mg total) by mouth 2 (two) times daily. 08/19/14   Nelia Shiobert L River Mckercher, MD  HYDROcodone-acetaminophen (NORCO/VICODIN) 5-325 MG per tablet Take 2 tablets by mouth every 4 (four) hours as needed. 08/19/14   Nelia Shiobert L Bryor Rami, MD   BP 105/54 mmHg  Pulse 95  Temp(Src) 98.6 F (37 C) (Oral)  Resp 21  Ht 5\' 3"  (1.6 m)  SpO2 95%  LMP 07/21/2014 Physical Exam  Constitutional: She is oriented to person, place, and time. She appears well-developed and well-nourished. She does not appear ill. No distress.  HENT:  Head: Normocephalic and atraumatic.  Eyes: Pupils are equal, round, and reactive to light.  Neck: Normal range of motion.  Cardiovascular: Normal rate and intact distal pulses.   Pulmonary/Chest: No respiratory distress. She has no wheezes.  Abdominal: Normal appearance. She exhibits no distension.  Musculoskeletal: Normal range of motion.  Neurological: She is alert and oriented to person, place, and time. No cranial nerve deficit.  Skin: Skin is warm and dry. No rash noted.  Psychiatric: She has a normal mood and affect. Her behavior is normal.  Nursing note and vitals reviewed.   ED Course  Procedures (including critical care time) Labs Review Labs Reviewed  CBC WITH DIFFERENTIAL - Abnormal; Notable for the following:    WBC 19.1 (*)    MCH 25.8 (*)    Neutro Abs 14.1 (*)    Monocytes Absolute 1.6 (*)    All other components within normal limits  COMPREHENSIVE METABOLIC PANEL - Abnormal; Notable for the following:    Sodium 133 (*)    Chloride 93 (*)    Glucose, Bld 246 (*)    Albumin 3.3 (*)    Total Bilirubin <0.2 (*)    GFR calc non Af Amer 64 (*)    GFR calc Af Amer 74 (*)    All other components within normal limits    Imaging Review Results for orders placed or performed during the  hospital encounter of 08/19/14  CBC with Differential  Result Value Ref Range   WBC 19.1 (H) 4.0 - 10.5 K/uL   RBC 5.08 3.87 - 5.11 MIL/uL   Hemoglobin 13.1 12.0 - 15.0 g/dL   HCT 45.440.5 09.836.0 - 11.946.0 %   MCV 79.7 78.0 - 100.0 fL   MCH 25.8 (L) 26.0 - 34.0 pg   MCHC 32.3 30.0 - 36.0 g/dL   RDW 14.715.5 82.911.5 - 56.215.5 %   Platelets 387 150 - 400 K/uL   Neutrophils Relative % 73 43 - 77 %   Neutro Abs 14.1 (H) 1.7 - 7.7 K/uL   Lymphocytes Relative 17 12 - 46 %   Lymphs Abs 3.2 0.7 - 4.0 K/uL   Monocytes Relative 9 3 - 12 %   Monocytes Absolute 1.6 (H) 0.1 - 1.0 K/uL   Eosinophils Relative 1 0 - 5 %   Eosinophils Absolute 0.2 0.0 - 0.7 K/uL   Basophils Relative 0 0 - 1 %   Basophils Absolute 0.0 0.0 - 0.1 K/uL  Comprehensive metabolic panel  Result Value Ref Range   Sodium 133 (L) 137 - 147 mEq/L   Potassium 4.8 3.7 - 5.3 mEq/L   Chloride 93 (L) 96 - 112 mEq/L   CO2 27 19 - 32 mEq/L   Glucose, Bld 246 (H) 70 - 99 mg/dL   BUN 10 6 - 23 mg/dL   Creatinine, Ser 1.301.03 0.50 - 1.10 mg/dL   Calcium 9.3 8.4 - 86.510.5 mg/dL   Total Protein 7.1 6.0 - 8.3 g/dL   Albumin 3.3 (L) 3.5 - 5.2 g/dL   AST 14 0 - 37 U/L   ALT 12 0 - 35 U/L   Alkaline Phosphatase 101 39 - 117 U/L   Total Bilirubin <0.2 (L) 0.3 - 1.2 mg/dL   GFR calc non Af Amer 64 (L) >90 mL/min   GFR calc Af Amer 74 (L) >90 mL/min   Anion gap 13 5 - 15   Dg Chest 2 View  08/19/2014   CLINICAL DATA:  Chest pain. Productive cough with right rib pain when coughing and shortness of breath.  EXAM: CHEST  2 VIEW  COMPARISON:  07/21/2014  FINDINGS: Cardiomediastinal silhouette is within normal limits. Mild anterior eventration of the right hemidiaphragm is unchanged. Again seen is mild peribronchial thickening chronic coarsening of the interstitial markings, with more focal interstitial densities present in the right lung base, minimally more prominent than on the prior study. No segmental airspace consolidation, overt pulmonary edema, pleural  effusion, or pneumothorax is identified. No acute osseous abnormality is identified.  IMPRESSION: Chronic interstitial coarsening with minimally increased densities in the right middle lobe, which may reflect acute infection superimposed on chronic bronchitis.   Electronically Signed  By: Sebastian Ache   On: 08/19/2014 18:28      EKG Interpretation   Date/Time:  Sunday August 19 2014 16:41:18 EST Ventricular Rate:  103 PR Interval:  140 QRS Duration: 80 QT Interval:  334 QTC Calculation: 437 R Axis:   104 Text Interpretation:  Sinus tachycardia Rightward axis Cannot rule out  Anteroseptal infarct , age undetermined Abnormal ECG ED PHYSICIAN  INTERPRETATION AVAILABLE IN [facility] Confirmed by TEST, Record  ([id number]) on 08/21/2014 7:16:50 AM     Patient started on doxycycline given Norco for pain.  Appears stable for discharge.  Told to return if shortness of breath or pain worsens. MDM   Final diagnoses:  CAP (community acquired pneumonia)        Nelia Shi, MD 08/27/14 [phone]

## 2014-10-12 ENCOUNTER — Ambulatory Visit: Payer: Self-pay | Admitting: Family Medicine

## 2014-10-23 ENCOUNTER — Emergency Department (HOSPITAL_COMMUNITY): Payer: Self-pay

## 2014-10-23 ENCOUNTER — Emergency Department (HOSPITAL_COMMUNITY)
Admission: EM | Admit: 2014-10-23 | Discharge: 2014-10-23 | Discharge status: Against Medical Advice | Payer: Self-pay | Attending: Emergency Medicine | Admitting: Emergency Medicine

## 2014-10-23 DIAGNOSIS — E119 Type 2 diabetes mellitus without complications: Secondary | ICD-10-CM | POA: Insufficient documentation

## 2014-10-23 DIAGNOSIS — Z720 Tobacco use: Secondary | ICD-10-CM | POA: Insufficient documentation

## 2014-10-23 DIAGNOSIS — Z794 Long term (current) use of insulin: Secondary | ICD-10-CM | POA: Insufficient documentation

## 2014-10-23 DIAGNOSIS — J45901 Unspecified asthma with (acute) exacerbation: Secondary | ICD-10-CM | POA: Insufficient documentation

## 2014-10-23 DIAGNOSIS — R0600 Dyspnea, unspecified: Secondary | ICD-10-CM

## 2014-10-23 DIAGNOSIS — Z79899 Other long term (current) drug therapy: Secondary | ICD-10-CM | POA: Insufficient documentation

## 2014-10-23 LAB — BASIC METABOLIC PANEL
Anion gap: 9 (ref 5–15)
BUN: 6 mg/dL (ref 6–23)
CO2: 26 mmol/L (ref 19–32)
Calcium: 8.6 mg/dL (ref 8.4–10.5)
Chloride: 100 mmol/L (ref 96–112)
Creatinine, Ser: 0.88 mg/dL (ref 0.50–1.10)
GFR calc Af Amer: 89 mL/min — ABNORMAL LOW (ref >90)
GFR, EST NON AFRICAN AMERICAN: 77 mL/min — AB (ref >90)
Glucose, Bld: 257 mg/dL — ABNORMAL HIGH (ref 70–99)
Potassium: 4.2 mmol/L (ref 3.5–5.1)
Sodium: 135 mmol/L (ref 135–145)

## 2014-10-23 LAB — CBC WITH DIFFERENTIAL/PLATELET
Basophils Absolute: 0 10*3/uL (ref 0.0–0.1)
Basophils Relative: 0 % (ref 0–1)
Eosinophils Absolute: 0 10*3/uL (ref 0.0–0.7)
Eosinophils Relative: 0 % (ref 0–5)
HCT: 39.6 % (ref 36.0–46.0)
HEMOGLOBIN: 12.6 g/dL (ref 12.0–15.0)
LYMPHS ABS: 1.6 10*3/uL (ref 0.7–4.0)
Lymphocytes Relative: 12 % (ref 12–46)
MCH: 25.4 pg — AB (ref 26.0–34.0)
MCHC: 31.8 g/dL (ref 30.0–36.0)
MCV: 79.7 fL (ref 78.0–100.0)
MONOS PCT: 6 % (ref 3–12)
Monocytes Absolute: 0.8 10*3/uL (ref 0.1–1.0)
NEUTROS PCT: 82 % — AB (ref 43–77)
Neutro Abs: 10.9 10*3/uL — ABNORMAL HIGH (ref 1.7–7.7)
Platelets: 409 10*3/uL — ABNORMAL HIGH (ref 150–400)
RBC: 4.97 MIL/uL (ref 3.87–5.11)
RDW: 15.8 % — ABNORMAL HIGH (ref 11.5–15.5)
WBC: 13.5 10*3/uL — ABNORMAL HIGH (ref 4.0–10.5)

## 2014-10-23 LAB — I-STAT TROPONIN, ED: Troponin i, poc: 0 ng/mL (ref 0.00–0.08)

## 2014-10-23 MED ORDER — ALBUTEROL SULFATE HFA 108 (90 BASE) MCG/ACT IN AERS: 2.0000 | INHALATION_SPRAY | RESPIRATORY_TRACT | Status: DC | PRN

## 2014-10-23 MED ORDER — IPRATROPIUM-ALBUTEROL 0.5-2.5 (3) MG/3ML IN SOLN
3.0000 mL | RESPIRATORY_TRACT | Status: DC
  Administered 2014-10-23: 3 mL via RESPIRATORY_TRACT
  Filled 2014-10-23: qty 3

## 2014-10-23 MED ORDER — MONTELUKAST SODIUM 10 MG PO TABS: 10.0000 mg | ORAL_TABLET | Freq: Every day | ORAL | Status: DC

## 2014-10-23 NOTE — ED Notes (Signed)
Pt walked out stating, "I'm going to see if my mom is out there. "

## 2014-10-23 NOTE — Discharge Instructions (Signed)

## 2014-10-23 NOTE — ED Provider Notes (Signed)
CSN: [id number]     Arrival date & time 10/23/14  1014 History   First MD Initiated Contact with Patient 10/23/14 1020     Chief Complaint  Patient presents with  . Shortness of Breath     (Consider location/radiation/quality/duration/timing/severity/associated sxs/prior Treatment) Patient is a 49 y.o. female presenting with shortness of breath.  Shortness of Breath Severity:  Moderate Onset quality:  Gradual Duration:  2 days Timing:  Constant Progression:  Worsening Chronicity:  Recurrent Context comment:  Out of home asthma medications Relieved by:  Nothing Worsened by:  Nothing tried Associated symptoms: chest pain (chest pressure) and cough   Associated symptoms: no abdominal pain, no fever and no sore throat     Past Medical History  Diagnosis Date  . Asthma   . Diabetes mellitus without complication   . Seasonal allergies    No past surgical history on file. No family history on file. History  Substance Use Topics  . Smoking status: Current Every Day Smoker -- 0.50 packs/day    Types: Cigarettes  . Smokeless tobacco: Not on file  . Alcohol Use: No   OB History    No data available     Review of Systems  Constitutional: Negative for fever.  HENT: Negative for sore throat.   Respiratory: Positive for cough and shortness of breath.   Cardiovascular: Positive for chest pain (chest pressure).  Gastrointestinal: Negative for abdominal pain.  All other systems reviewed and are negative.     Allergies  Erythromycin  Home Medications   Prior to Admission medications   Medication Sig Start Date End Date Taking? Authorizing Provider  esomeprazole (NEXIUM) 40 MG capsule Take 40 mg by mouth daily at 12 noon.   Yes Historical Provider, MD  insulin glargine (LANTUS) 100 UNIT/ML injection Inject 50 Units into the skin at bedtime.   Yes Historical Provider, MD  insulin lispro (HUMALOG) 100 UNIT/ML injection Inject 4-20 Units into the skin 3 (three) times daily  with meals. On a sliding scale   Yes Historical Provider, MD  lisinopril (PRINIVIL,ZESTRIL) 10 MG tablet Take 10 mg by mouth daily.   Yes Historical Provider, MD  rosuvastatin (CRESTOR) 10 MG tablet Take 10 mg by mouth at bedtime.   Yes Historical Provider, MD  venlafaxine (EFFEXOR) 75 MG tablet Take 75 mg by mouth 3 (three) times daily with meals.   Yes Historical Provider, MD  albuterol (PROVENTIL HFA;VENTOLIN HFA) 108 (90 BASE) MCG/ACT inhaler Inhale 2 puffs into the lungs every 4 (four) hours as needed for wheezing. 10/23/14   Mirian Mo, MD  doxycycline (VIBRAMYCIN) 100 MG capsule Take 1 capsule (100 mg total) by mouth 2 (two) times daily. Patient not taking: Reported on 10/23/2014 08/19/14   Nelia Shi, MD  HYDROcodone-acetaminophen (NORCO/VICODIN) 5-325 MG per tablet Take 2 tablets by mouth every 4 (four) hours as needed. Patient not taking: Reported on 10/23/2014 08/19/14   Nelia Shi, MD  montelukast (SINGULAIR) 10 MG tablet Take 1 tablet (10 mg total) by mouth at bedtime. 10/23/14   Mirian Mo, MD   BP 147/75 mmHg  Pulse 105  Temp(Src) 97.8 F (36.6 C) (Oral)  Resp 25  SpO2 88% Physical Exam  Constitutional: She is oriented to person, place, and time. She appears well-developed and well-nourished.  HENT:  Head: Normocephalic and atraumatic.  Right Ear: External ear normal.  Left Ear: External ear normal.  Eyes: Conjunctivae and EOM are normal. Pupils are equal, round, and reactive to light.  Neck:  Normal range of motion. Neck supple.  Cardiovascular: Normal rate, regular rhythm, normal heart sounds and intact distal pulses.   Pulmonary/Chest: Effort normal. She has wheezes (diffusely).  Abdominal: Soft. Bowel sounds are normal. There is no tenderness.  Musculoskeletal: Normal range of motion.  Neurological: She is alert and oriented to person, place, and time.  Skin: Skin is warm and dry.  Vitals reviewed.   ED Course  Procedures (including critical care  time) Labs Review Labs Reviewed  CBC WITH DIFFERENTIAL/PLATELET - Abnormal; Notable for the following:    WBC 13.5 (*)    MCH 25.4 (*)    RDW 15.8 (*)    Platelets 409 (*)    Neutrophils Relative % 82 (*)    Neutro Abs 10.9 (*)    All other components within normal limits  BASIC METABOLIC PANEL - Abnormal; Notable for the following:    Glucose, Bld 257 (*)    GFR calc non Af Amer 77 (*)    GFR calc Af Amer 89 (*)    All other components within normal limits  Rosezena SensorI-STAT TROPOININ, ED    Imaging Review Dg Chest 2 View  10/23/2014   CLINICAL DATA:  Shortness of breath with cough and wheezing.  EXAM: CHEST  2 VIEW  COMPARISON:  08/19/2014  FINDINGS: The heart size and pulmonary vascularity are normal. There is slight peribronchial thickening on the lateral view. No consolidative infiltrates or effusions. No acute osseous abnormality. Old fracture of the right seventh rib posterior laterally.  IMPRESSION: Bronchitic changes.   Electronically Signed   By: Francene BoyersJames  Maxwell M.D.   On: 10/23/2014 11:31     EKG Interpretation   Date/Time:  Tuesday October 23 2014 10:23:41 EST Ventricular Rate:  106 PR Interval:  151 QRS Duration: 94 QT Interval:  332 QTC Calculation: 441 R Axis:   47 Text Interpretation:  Sinus tachycardia Biatrial enlargement Anterolateral  infarct, age indeterminate Abnormal T, consider ischemia, lateral leads t  wave inversions new in inferolateral leads Confirmed by Mirian MoGentry, Matthew  [phone]([id number]) on 10/23/2014 10:26:23 AM      MDM   Final diagnoses:  Dyspnea    49 y.o. female with pertinent PMH of ashtma presents with recurrent shortness of breath in setting of being out of her home medications over the last week. Patient states symptoms worsen last night. No infectious symptoms. No GI symptoms. On arrival patient has vital signs physical exam as above. She was given some Medrol and a DuoNeb by EMS and had vast improvement of symptoms. On my examination the patient is  not In respiratory distress.  Ordered CXR and wu, but pt requested to leave.  Discussed risks, pt dc ama.  I have reviewed all laboratory and imaging studies if ordered as above  1. Dyspnea         Mirian MoMatthew Gentry, MD 10/24/14 [phone]

## 2014-10-23 NOTE — ED Notes (Signed)
Pt c/o shortness of breath and wheezing that started last night. EMS reports being called out last night by patient twice with patient refusing treatment the first time and only agreeing to a breathing treatment the second time and then being called out again this morning by patient agreeing to be transported to ED. Pt received an additional breathing treatment (5mg  of albuterol and 0.5mg  of atrovent) and 125mg  of solu-medrol IV.  Pt then received another breathing treatment (5mg  of albuterol) en route to ED.

## 2014-10-23 NOTE — ED Notes (Signed)
Littie DeedsGentry, MD at bedside talking with pt.

## 2014-10-23 NOTE — ED Notes (Signed)
Pt left AMA and left without discharge paperwork/prescriptions.

## 2014-10-23 NOTE — ED Notes (Signed)
Pt discontinued montior and states she wants her IV taken out and she is leaving. Littie DeedsGentry, MD at bedside.

## 2014-10-23 NOTE — ED Notes (Signed)
Bed: WA15 Expected date:  Expected time:  Means of arrival:  Comments: EMS-[facility] 

## 2014-11-05 ENCOUNTER — Emergency Department (HOSPITAL_COMMUNITY)
Admission: EM | Admit: 2014-11-05 | Discharge: 2014-11-05 | Disposition: A | Discharge status: Home / Self Care | Payer: Self-pay | Attending: Emergency Medicine | Admitting: Emergency Medicine

## 2014-11-05 ENCOUNTER — Encounter (HOSPITAL_COMMUNITY): Payer: Self-pay | Admitting: Emergency Medicine

## 2014-11-05 DIAGNOSIS — E119 Type 2 diabetes mellitus without complications: Secondary | ICD-10-CM | POA: Insufficient documentation

## 2014-11-05 DIAGNOSIS — Z720 Tobacco use: Secondary | ICD-10-CM | POA: Insufficient documentation

## 2014-11-05 DIAGNOSIS — Z79899 Other long term (current) drug therapy: Secondary | ICD-10-CM | POA: Insufficient documentation

## 2014-11-05 DIAGNOSIS — Z794 Long term (current) use of insulin: Secondary | ICD-10-CM | POA: Insufficient documentation

## 2014-11-05 DIAGNOSIS — J45901 Unspecified asthma with (acute) exacerbation: Secondary | ICD-10-CM | POA: Insufficient documentation

## 2014-11-05 DIAGNOSIS — Z792 Long term (current) use of antibiotics: Secondary | ICD-10-CM | POA: Insufficient documentation

## 2014-11-05 MED ORDER — IPRATROPIUM-ALBUTEROL 0.5-2.5 (3) MG/3ML IN SOLN
3.0000 mL | Freq: Once | RESPIRATORY_TRACT | Status: AC
  Administered 2014-11-05: 3 mL via RESPIRATORY_TRACT

## 2014-11-05 MED ORDER — PREDNISONE 20 MG PO TABS: 40.0000 mg | ORAL_TABLET | Freq: Every day | ORAL | Status: DC

## 2014-11-05 MED ORDER — IPRATROPIUM-ALBUTEROL 0.5-2.5 (3) MG/3ML IN SOLN
RESPIRATORY_TRACT | Status: AC
  Filled 2014-11-05: qty 3

## 2014-11-05 MED ORDER — PREDNISONE 20 MG PO TABS
60.0000 mg | ORAL_TABLET | Freq: Once | ORAL | Status: AC
  Administered 2014-11-05: 60 mg via ORAL
  Filled 2014-11-05: qty 3

## 2014-11-05 MED ORDER — ALBUTEROL SULFATE HFA 108 (90 BASE) MCG/ACT IN AERS
1.0000 | INHALATION_SPRAY | Freq: Four times a day (QID) | RESPIRATORY_TRACT | Status: DC | PRN
  Administered 2014-11-05: 2 via RESPIRATORY_TRACT
  Filled 2014-11-05: qty 6.7

## 2014-11-05 MED ORDER — IPRATROPIUM-ALBUTEROL 0.5-2.5 (3) MG/3ML IN SOLN
3.0000 mL | Freq: Once | RESPIRATORY_TRACT | Status: AC
  Administered 2014-11-05: 3 mL via RESPIRATORY_TRACT
  Filled 2014-11-05: qty 3

## 2014-11-05 NOTE — ED Notes (Signed)
Pt reports wheezing x 1 week. Out of inhaler x 3 days. Wheezes noted in all lung fields.

## 2014-11-05 NOTE — ED Provider Notes (Signed)
CSN: [id number]     Arrival date & time 11/05/14  1842 History   First MD Initiated Contact with Patient 11/05/14 2136     Chief Complaint  Patient presents with  . Asthma     (Consider location/radiation/quality/duration/timing/severity/associated sxs/prior Treatment) Patient is a 49 y.o. female presenting with shortness of breath. The history is provided by the patient. No language interpreter was used.  Shortness of Breath Severity:  Moderate Onset quality:  Gradual Duration:  2 days Timing:  Constant Progression:  Worsening Chronicity:  Recurrent Context: not activity, not animal exposure, not known allergens, not occupational exposure, not strong odors and not weather changes   Context comment:  Out of asthma medication Relieved by:  Nothing Worsened by:  Nothing tried Ineffective treatments:  None tried Associated symptoms: wheezing   Associated symptoms: no abdominal pain, no chest pain, no fever, no neck pain and no vomiting   Wheezing:    Severity:  Moderate   Onset quality:  Gradual   Duration:  2 days   Timing:  Constant   Progression:  Worsening   Chronicity:  Recurrent Risk factors: no recent alcohol use, no family hx of DVT, no hx of cancer, no hx of PE/DVT, no obesity, no oral contraceptive use, no prolonged immobilization, no recent surgery and no tobacco use     Past Medical History  Diagnosis Date  . Asthma   . Diabetes mellitus without complication   . Seasonal allergies    History reviewed. No pertinent past surgical history. No family history on file. History  Substance Use Topics  . Smoking status: Current Every Day Smoker -- 0.50 packs/day    Types: Cigarettes  . Smokeless tobacco: Not on file  . Alcohol Use: No   OB History    No data available     Review of Systems  Constitutional: Negative for fever, chills and fatigue.  HENT: Negative for trouble swallowing.   Eyes: Negative for visual disturbance.  Respiratory: Positive for  wheezing. Negative for shortness of breath.   Cardiovascular: Negative for chest pain and palpitations.  Gastrointestinal: Negative for nausea, vomiting, abdominal pain and diarrhea.  Genitourinary: Negative for dysuria and difficulty urinating.  Musculoskeletal: Negative for arthralgias and neck pain.  Skin: Negative for color change.  Neurological: Negative for dizziness and weakness.  Psychiatric/Behavioral: Negative for dysphoric mood.      Allergies  Erythromycin  Home Medications   Prior to Admission medications   Medication Sig Start Date End Date Taking? Authorizing Provider  albuterol (PROVENTIL HFA;VENTOLIN HFA) 108 (90 BASE) MCG/ACT inhaler Inhale 2 puffs into the lungs every 4 (four) hours as needed for wheezing. 10/23/14   Mirian Mo, MD  doxycycline (VIBRAMYCIN) 100 MG capsule Take 1 capsule (100 mg total) by mouth 2 (two) times daily. Patient not taking: Reported on 10/23/2014 08/19/14   Nelia Shi, MD  esomeprazole (NEXIUM) 40 MG capsule Take 40 mg by mouth daily at 12 noon.    Historical Provider, MD  HYDROcodone-acetaminophen (NORCO/VICODIN) 5-325 MG per tablet Take 2 tablets by mouth every 4 (four) hours as needed. Patient not taking: Reported on 10/23/2014 08/19/14   Nelia Shi, MD  insulin glargine (LANTUS) 100 UNIT/ML injection Inject 50 Units into the skin at bedtime.    Historical Provider, MD  insulin lispro (HUMALOG) 100 UNIT/ML injection Inject 4-20 Units into the skin 3 (three) times daily with meals. On a sliding scale    Historical Provider, MD  lisinopril (PRINIVIL,ZESTRIL) 10 MG tablet Take  10 mg by mouth daily.    Historical Provider, MD  montelukast (SINGULAIR) 10 MG tablet Take 1 tablet (10 mg total) by mouth at bedtime. 10/23/14   Mirian MoMatthew Gentry, MD  rosuvastatin (CRESTOR) 10 MG tablet Take 10 mg by mouth at bedtime.    Historical Provider, MD  venlafaxine (EFFEXOR) 75 MG tablet Take 75 mg by mouth 3 (three) times daily with meals.     Historical Provider, MD   BP 154/79 mmHg  Pulse 96  Temp(Src) 97.8 F (36.6 C)  Resp 23  Ht 5\' 6"  (1.676 m)  Wt 214 lb 2 oz (97.126 kg)  BMI 34.58 kg/m2  SpO2 98%  LMP 08/22/2014 (Approximate) Physical Exam  Constitutional: She is oriented to person, place, and time. She appears well-developed and well-nourished. No distress.  HENT:  Head: Normocephalic and atraumatic.  Eyes: Conjunctivae and EOM are normal.  Neck: Normal range of motion.  Cardiovascular: Normal rate and regular rhythm.  Exam reveals no gallop and no friction rub.   No murmur heard. Pulmonary/Chest: Effort normal and breath sounds normal. She has no wheezes. She has no rales. She exhibits no tenderness.  Abdominal: Soft. She exhibits no distension. There is no tenderness. There is no rebound.  Musculoskeletal: Normal range of motion.  No lower extremity swelling or calf tenderness to palpation.   Neurological: She is alert and oriented to person, place, and time. Coordination normal.  Speech is goal-oriented. Moves limbs without ataxia.   Skin: Skin is warm and dry.  Psychiatric: She has a normal mood and affect. Her behavior is normal.  Nursing note and vitals reviewed.   ED Course  Procedures (including critical care time) Labs Review Labs Reviewed - No data to display  Imaging Review No results found.   EKG Interpretation None      MDM   Final diagnoses:  Asthma exacerbation   10:27 PM Patient given 1 nebulizer here. Patient will have a duoneb treatment. Patient will have prednisone for asthma exacerbation. Vitals stable and patient afebrile.    Emilia BeckKaitlyn Shaquile Lutze, PA-C 11/05/14 2252  Gerhard Munchobert Lockwood, MD 11/06/14 [phone]

## 2014-11-05 NOTE — Discharge Instructions (Signed)
Take prednisone as directed until gone. Use inhaler as needed. Refer to attached documents for more information.

## 2015-03-20 ENCOUNTER — Emergency Department (HOSPITAL_COMMUNITY)
Admission: EM | Admit: 2015-03-20 | Discharge: 2015-03-20 | Disposition: A | Discharge status: Home / Self Care | Payer: Self-pay

## 2015-03-20 LAB — CBG MONITORING, ED: Glucose-Capillary: 124 mg/dL — ABNORMAL HIGH (ref 65–99)

## 2015-03-20 NOTE — ED Notes (Signed)
NO ANSWER X1

## 2015-03-20 NOTE — ED Notes (Signed)
Called pts name multiple times with no answer in lobby

## 2015-05-07 ENCOUNTER — Emergency Department (HOSPITAL_COMMUNITY)
Admission: EM | Admit: 2015-05-07 | Discharge: 2015-05-07 | Discharge status: Absent without leave | Payer: Self-pay | Source: Home / Self Care

## 2015-05-07 ENCOUNTER — Emergency Department (HOSPITAL_COMMUNITY)
Admission: EM | Admit: 2015-05-07 | Discharge: 2015-05-07 | Disposition: A | Discharge status: Home / Self Care | Payer: Self-pay | Source: Home / Self Care

## 2015-08-01 ENCOUNTER — Emergency Department (HOSPITAL_COMMUNITY): Payer: Self-pay

## 2015-08-01 ENCOUNTER — Encounter (HOSPITAL_COMMUNITY): Payer: Self-pay | Admitting: Cardiology

## 2015-08-01 ENCOUNTER — Emergency Department (HOSPITAL_COMMUNITY)
Admission: EM | Admit: 2015-08-01 | Discharge: 2015-08-01 | Disposition: A | Discharge status: Home / Self Care | Payer: Self-pay | Attending: Emergency Medicine | Admitting: Emergency Medicine

## 2015-08-01 DIAGNOSIS — Z79899 Other long term (current) drug therapy: Secondary | ICD-10-CM | POA: Insufficient documentation

## 2015-08-01 DIAGNOSIS — J45901 Unspecified asthma with (acute) exacerbation: Secondary | ICD-10-CM | POA: Insufficient documentation

## 2015-08-01 DIAGNOSIS — J209 Acute bronchitis, unspecified: Secondary | ICD-10-CM

## 2015-08-01 DIAGNOSIS — R110 Nausea: Secondary | ICD-10-CM | POA: Insufficient documentation

## 2015-08-01 DIAGNOSIS — Z794 Long term (current) use of insulin: Secondary | ICD-10-CM | POA: Insufficient documentation

## 2015-08-01 DIAGNOSIS — E119 Type 2 diabetes mellitus without complications: Secondary | ICD-10-CM | POA: Insufficient documentation

## 2015-08-01 DIAGNOSIS — F17210 Nicotine dependence, cigarettes, uncomplicated: Secondary | ICD-10-CM | POA: Insufficient documentation

## 2015-08-01 LAB — BASIC METABOLIC PANEL
Anion gap: 8 (ref 5–15)
BUN: 6 mg/dL (ref 6–20)
CO2: 29 mmol/L (ref 22–32)
CREATININE: 1.04 mg/dL — AB (ref 0.44–1.00)
Calcium: 9 mg/dL (ref 8.9–10.3)
Chloride: 101 mmol/L (ref 101–111)
GFR calc Af Amer: >60 mL/min (ref >60)
GLUCOSE: 55 mg/dL — AB (ref 65–99)
POTASSIUM: 3.9 mmol/L (ref 3.5–5.1)
SODIUM: 138 mmol/L (ref 135–145)

## 2015-08-01 LAB — CBC
HCT: 38.6 % (ref 36.0–46.0)
Hemoglobin: 12.1 g/dL (ref 12.0–15.0)
MCH: 24.7 pg — ABNORMAL LOW (ref 26.0–34.0)
MCHC: 31.3 g/dL (ref 30.0–36.0)
MCV: 78.9 fL (ref 78.0–100.0)
PLATELETS: 476 10*3/uL — AB (ref 150–400)
RBC: 4.89 MIL/uL (ref 3.87–5.11)
RDW: 15.5 % (ref 11.5–15.5)
WBC: 16.4 10*3/uL — AB (ref 4.0–10.5)

## 2015-08-01 LAB — I-STAT TROPONIN, ED: TROPONIN I, POC: 0 ng/mL (ref 0.00–0.08)

## 2015-08-01 MED ORDER — PREDNISONE 10 MG PO TABS: 20.0000 mg | ORAL_TABLET | Freq: Two times a day (BID) | ORAL | Status: DC

## 2015-08-01 MED ORDER — DOXYCYCLINE HYCLATE 100 MG PO CAPS: 100.0000 mg | ORAL_CAPSULE | Freq: Two times a day (BID) | ORAL | Status: DC

## 2015-08-01 MED ORDER — ALBUTEROL SULFATE HFA 108 (90 BASE) MCG/ACT IN AERS
2.0000 | INHALATION_SPRAY | RESPIRATORY_TRACT | Status: DC | PRN
  Administered 2015-08-01: 2 via RESPIRATORY_TRACT
  Filled 2015-08-01: qty 6.7

## 2015-08-01 NOTE — ED Notes (Signed)
Reports chest pain, sore throat and nausea for about 3 weeks. Reports she has tried OTC medications without relief. States she chest hurts all the time.

## 2015-08-01 NOTE — Discharge Instructions (Signed)
Zithromax and prednisone as prescribed.  Albuterol inhaler: 2 puffs every 4 hours as needed for wheezing or difficulty breathing.  Return to the emergency department if you develop chest pain, worsening breathing, or other new and concerning symptoms.   Acute Bronchitis Bronchitis is inflammation of the airways that extend from the windpipe into the lungs (bronchi). The inflammation often causes mucus to develop. This leads to a cough, which is the most common symptom of bronchitis.  In acute bronchitis, the condition usually develops suddenly and goes away over time, usually in a couple weeks. Smoking, allergies, and asthma can make bronchitis worse. Repeated episodes of bronchitis may cause further lung problems.  CAUSES Acute bronchitis is most often caused by the same virus that causes a cold. The virus can spread from person to person (contagious) through coughing, sneezing, and touching contaminated objects. SIGNS AND SYMPTOMS   Cough.   Fever.   Coughing up mucus.   Body aches.   Chest congestion.   Chills.   Shortness of breath.   Sore throat.  DIAGNOSIS  Acute bronchitis is usually diagnosed through a physical exam. Your health care provider will also ask you questions about your medical history. Tests, such as chest X-rays, are sometimes done to rule out other conditions.  TREATMENT  Acute bronchitis usually goes away in a couple weeks. Oftentimes, no medical treatment is necessary. Medicines are sometimes given for relief of fever or cough. Antibiotic medicines are usually not needed but may be prescribed in certain situations. In some cases, an inhaler may be recommended to help reduce shortness of breath and control the cough. A cool mist vaporizer may also be used to help thin bronchial secretions and make it easier to clear the chest.  HOME CARE INSTRUCTIONS  Get plenty of rest.   Drink enough fluids to keep your urine clear or pale yellow (unless you have  a medical condition that requires fluid restriction). Increasing fluids may help thin your respiratory secretions (sputum) and reduce chest congestion, and it will prevent dehydration.   Take medicines only as directed by your health care provider.  If you were prescribed an antibiotic medicine, finish it all even if you start to feel better.  Avoid smoking and secondhand smoke. Exposure to cigarette smoke or irritating chemicals will make bronchitis worse. If you are a smoker, consider using nicotine gum or skin patches to help control withdrawal symptoms. Quitting smoking will help your lungs heal faster.   Reduce the chances of another bout of acute bronchitis by washing your hands frequently, avoiding people with cold symptoms, and trying not to touch your hands to your mouth, nose, or eyes.   Keep all follow-up visits as directed by your health care provider.  SEEK MEDICAL CARE IF: Your symptoms do not improve after 1 week of treatment.  SEEK IMMEDIATE MEDICAL CARE IF:  You develop an increased fever or chills.   You have chest pain.   You have severe shortness of breath.  You have bloody sputum.   You develop dehydration.  You faint or repeatedly feel like you are going to pass out.  You develop repeated vomiting.  You develop a severe headache. MAKE SURE YOU:   Understand these instructions.  Will watch your condition.  Will get help right away if you are not doing well or get worse.   This information is not intended to replace advice given to you by your health care provider. Make sure you discuss any questions you have with  your health care provider.   Document Released: 10/01/2004 Document Revised: 09/14/2014 Document Reviewed: 02/14/2013 Elsevier Interactive Patient Education [employer].

## 2015-08-01 NOTE — ED Provider Notes (Signed)
CSN: [id number]     Arrival date & time 08/01/15  1340 History   First MD Initiated Contact with Patient 08/01/15 1614     Chief Complaint  Patient presents with  . Chest Pain  . Sore Throat  . Nausea     (Consider location/radiation/quality/duration/timing/severity/associated sxs/prior Treatment) HPI Comments: Patient is a 49 year old female with history of diabetes, high cholesterol, and hypertension. She is a pack a day smoker. She presents today with complaints of sore throat, chest congestion, cough, and not feeling well for the past several weeks. She has tried over-the-counter medications and Tamiflu from an old prescription which have not helped. She denies any ill contacts. She denies any fevers or chills.  The history is provided by the patient.    Past Medical History  Diagnosis Date  . Asthma   . Diabetes mellitus without complication (HCC)   . Seasonal allergies    History reviewed. No pertinent past surgical history. History reviewed. No pertinent family history. Social History  Substance Use Topics  . Smoking status: Current Every Day Smoker -- 0.50 packs/day    Types: Cigarettes  . Smokeless tobacco: None  . Alcohol Use: No   OB History    No data available     Review of Systems  All other systems reviewed and are negative.     Allergies  Erythromycin  Home Medications   Prior to Admission medications   Medication Sig Start Date End Date Taking? Authorizing Provider  albuterol (PROVENTIL HFA;VENTOLIN HFA) 108 (90 BASE) MCG/ACT inhaler Inhale 2 puffs into the lungs every 4 (four) hours as needed for wheezing. 10/23/14  Yes Mirian Mo, MD  esomeprazole (NEXIUM) 40 MG capsule Take 40 mg by mouth daily at 12 noon.   Yes Historical Provider, MD  ibuprofen (ADVIL,MOTRIN) 200 MG tablet Take 600 mg by mouth every 6 (six) hours as needed (pain).   Yes Historical Provider, MD  insulin glargine (LANTUS) 100 UNIT/ML injection Inject 50 Units into the skin  at bedtime.   Yes Historical Provider, MD  insulin lispro (HUMALOG) 100 UNIT/ML injection Inject 4-20 Units into the skin 3 (three) times daily with meals. On a sliding scale   Yes Historical Provider, MD  lisinopril (PRINIVIL,ZESTRIL) 10 MG tablet Take 10 mg by mouth daily.   Yes Historical Provider, MD  montelukast (SINGULAIR) 10 MG tablet Take 1 tablet (10 mg total) by mouth at bedtime. 10/23/14  Yes Mirian Mo, MD  oseltamivir (TAMIFLU) 75 MG capsule Take 75 mg by mouth 2 (two) times daily.   Yes Historical Provider, MD  Phenylephrine-Pheniramine-DM [facility] COLD & COUGH PO) Take 1 each by mouth 2 (two) times daily as needed (could symptoms).   Yes Historical Provider, MD  rosuvastatin (CRESTOR) 10 MG tablet Take 10 mg by mouth at bedtime.   Yes Historical Provider, MD  venlafaxine (EFFEXOR) 75 MG tablet Take 75 mg by mouth 3 (three) times daily with meals.   Yes Historical Provider, MD   BP 105/72 mmHg  Pulse 109  Temp(Src)   Resp 18  Ht  (1.651 m)  Wt 214 lb (97.07 kg)  BMI 35.61 kg/m2  SpO2 99%  LMP 07/17/2015 Physical Exam  Constitutional: She is oriented to person, place, and time. She appears well-developed and well-nourished. No distress.  HENT:  Head: Normocephalic.  Mouth/Throat: Oropharynx is clear and moist.  Neck: Normal range of motion. Neck supple.  Cardiovascular: Normal rate, [id number] and normal heart sounds.   No murmur heard. Pulmonary/Chest:  Effort normal. No respiratory distress. She has wheezes.  Abdominal: Soft. Bowel sounds are normal. She exhibits no distension. There is no tenderness.  Musculoskeletal: Normal range of motion. She exhibits no edema.  Neurological: She is alert and oriented to person, place, and time.  Skin: Skin is warm and dry. She is not diaphoretic.  Nursing note and vitals reviewed.   ED Course  Procedures (including critical care time) Labs Review Labs Reviewed  BASIC METABOLIC PANEL - Abnormal; Notable for the  following:    Glucose, Bld 55 (*)    Creatinine, Ser 1.04 (*)    All other components within normal limits  CBC - Abnormal; Notable for the following:    WBC 16.4 (*)    MCH 24.7 (*)    Platelets 476 (*)    All other components within normal limits  Rosezena SensorI-STAT TROPOININ, ED    Imaging Review Dg Chest 2 View  08/01/2015  CLINICAL DATA:  Shortness of breath.  Nausea. EXAM: CHEST  2 VIEW COMPARISON:  10/23/2014 and 07/21/2014 FINDINGS: The heart size and mediastinal contours are within normal limits. No infiltrates or effusions. Minimal scarring at the right lung base. Old right posterior lateral seventh rib fracture. IMPRESSION: No active cardiopulmonary disease. Electronically Signed   By: Francene BoyersJames  Maxwell M.D.   On: 08/01/2015 14:02   I have personally reviewed and evaluated these images and lab results as part of my medical decision-making.   EKG Interpretation   Date/Time:  Thursday August 01 2015 13:45:35 EST Ventricular Rate:  109 PR Interval:  138 QRS Duration: 78 QT Interval:  330 QTC Calculation: 444 R Axis:   29 Text Interpretation:  Sinus tachycardia Anterior infarct , age  undetermined Abnormal ECG Confirmed by Kaymon Denomme  MD, Yaret Hush ([id number]) on  08/01/2015 4:20:57 PM      MDM   Final diagnoses:  None    Patient presents with a several week history of URI-like symptoms. Her chest x-ray is clear and cardiac workup is negative. As she has had no relief with over-the-counter medications, I will prescribe Zithromax, prednisone, and refill her inhaler. She is to return as needed if symptoms worsen or change.    Geoffery Lyonsouglas Milani Lowenstein, MD 08/01/15 [phone]

## 2015-09-30 ENCOUNTER — Encounter (HOSPITAL_COMMUNITY): Payer: Self-pay

## 2015-09-30 ENCOUNTER — Emergency Department (HOSPITAL_COMMUNITY)
Admission: EM | Admit: 2015-09-30 | Discharge: 2015-09-30 | Disposition: A | Discharge status: Home / Self Care | Payer: Self-pay | Attending: Emergency Medicine | Admitting: Emergency Medicine

## 2015-09-30 DIAGNOSIS — Y9389 Activity, other specified: Secondary | ICD-10-CM | POA: Insufficient documentation

## 2015-09-30 DIAGNOSIS — Z79899 Other long term (current) drug therapy: Secondary | ICD-10-CM | POA: Insufficient documentation

## 2015-09-30 DIAGNOSIS — T7421XA Adult sexual abuse, confirmed, initial encounter: Secondary | ICD-10-CM | POA: Insufficient documentation

## 2015-09-30 DIAGNOSIS — E119 Type 2 diabetes mellitus without complications: Secondary | ICD-10-CM | POA: Insufficient documentation

## 2015-09-30 DIAGNOSIS — M25562 Pain in left knee: Secondary | ICD-10-CM

## 2015-09-30 DIAGNOSIS — J45909 Unspecified asthma, uncomplicated: Secondary | ICD-10-CM | POA: Insufficient documentation

## 2015-09-30 DIAGNOSIS — Z7952 Long term (current) use of systemic steroids: Secondary | ICD-10-CM | POA: Insufficient documentation

## 2015-09-30 DIAGNOSIS — F17210 Nicotine dependence, cigarettes, uncomplicated: Secondary | ICD-10-CM | POA: Insufficient documentation

## 2015-09-30 DIAGNOSIS — Z792 Long term (current) use of antibiotics: Secondary | ICD-10-CM | POA: Insufficient documentation

## 2015-09-30 DIAGNOSIS — M25561 Pain in right knee: Secondary | ICD-10-CM

## 2015-09-30 DIAGNOSIS — S8992XA Unspecified injury of left lower leg, initial encounter: Secondary | ICD-10-CM | POA: Insufficient documentation

## 2015-09-30 DIAGNOSIS — Z794 Long term (current) use of insulin: Secondary | ICD-10-CM | POA: Insufficient documentation

## 2015-09-30 DIAGNOSIS — Y999 Unspecified external cause status: Secondary | ICD-10-CM | POA: Insufficient documentation

## 2015-09-30 DIAGNOSIS — Y9289 Other specified places as the place of occurrence of the external cause: Secondary | ICD-10-CM | POA: Insufficient documentation

## 2015-09-30 DIAGNOSIS — S8991XA Unspecified injury of right lower leg, initial encounter: Secondary | ICD-10-CM | POA: Insufficient documentation

## 2015-09-30 MED ORDER — IBUPROFEN 400 MG PO TABS
800.0000 mg | ORAL_TABLET | Freq: Once | ORAL | Status: AC
  Administered 2015-09-30: 800 mg via ORAL
  Filled 2015-09-30: qty 2
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
## 2015-09-30 NOTE — ED Notes (Signed)
Patient states that her ride is here and that she will come back at another time. Encouraged patient to stay.

## 2015-09-30 NOTE — Discharge Instructions (Signed)
Knee Pain Knee pain is a very common symptom and can have many causes. Knee pain often goes away when you follow your health care provider's instructions for relieving pain and discomfort at home. However, knee pain can develop into a condition that needs treatment. Some conditions may include:  Arthritis caused by wear and tear (osteoarthritis).  Arthritis caused by swelling and irritation (rheumatoid arthritis or gout).  A cyst or growth in your knee.  An infection in your knee joint.  An injury that will not heal.  Damage, swelling, or irritation of the tissues that support your knee (torn ligaments or tendinitis). If your knee pain continues, additional tests may be ordered to diagnose your condition. Tests may include X-rays or other imaging studies of your knee. You may also need to have fluid removed from your knee. Treatment for ongoing knee pain depends on the cause, but treatment may include:  Medicines to relieve pain or swelling.  Steroid injections in your knee.  Physical therapy.  Surgery. HOME CARE INSTRUCTIONS  Take medicines only as directed by your health care provider.  Rest your knee and keep it raised (elevated) while you are resting.  Do not do things that cause or worsen pain.  Avoid high-impact activities or exercises, such as running, jumping rope, or doing jumping jacks.  Apply ice to the knee area:  Put ice in a plastic bag.  Place a towel between your skin and the bag.  Leave the ice on for 20 minutes, 2-3 times a day.  Ask your health care provider if you should wear an elastic knee support.  Keep a pillow under your knee when you sleep.  Lose weight if you are overweight. Extra weight can put pressure on your knee.  Do not use any tobacco products, including cigarettes, chewing tobacco, or electronic cigarettes. If you need help quitting, ask your health care provider. Smoking may slow the healing of any bone and joint problems that you may  have. SEEK MEDICAL CARE IF:  Your knee pain continues, changes, or gets worse.  You have a fever along with knee pain.  Your knee buckles or locks up.  Your knee becomes more swollen. SEEK IMMEDIATE MEDICAL CARE IF:   Your knee joint feels hot to the touch.  You have chest pain or trouble breathing.   This information is not intended to replace advice given to you by your health care provider. Make sure you discuss any questions you have with your health care provider.   Document Released: 06/21/2007 Document Revised: 09/14/2014 Document Reviewed: 04/09/2014 Elsevier Interactive Patient Education [employer]  Sexual Assault or Rape Sexual assault is any sexual activity that a person is forced, threatened, or coerced into participating in. It may or may not involve physical contact. You are being sexually abused if you are forced to have sexual contact of any kind. Sexual assault is called rape if penetration has occurred (vaginal, oral, or anal). Many times, sexual assaults are committed by a friend, relative, or associate. Sexual assault and rape are never the victim's fault.  Sexual assault can result in various health problems for the person who was assaulted. Some of these problems include:  Physical injuries in the genital area or other areas of the body.  Risk of unwanted pregnancy.  Risk of sexually transmitted infections (STIs).  Psychological problems such as anxiety, depression, or posttraumatic stress disorder. WHAT STEPS SHOULD BE TAKEN AFTER A SEXUAL ASSAULT? If you have been sexually assaulted, you should  take the following steps as soon as possible:  Go to a safe area as quickly as possible and call your local emergency services (911 in [place] Get away from the area where you have been attacked.   Do not wash, shower, comb your hair, or clean any part of your body.   Do not change your clothes.   Do not remove or touch anything in the area where you  were assaulted.   Go to an emergency room for a complete physical exam. Get the necessary tests to protect yourself from STIs or pregnancy. You may be treated for an STI even if no signs of one are present. Emergency contraceptive medicines are also available to help prevent pregnancy, if this is desired. You may need to be examined by a specially trained health care provider.  Have the health care provider collect evidence during the exam, even if you are not sure if you will file a report with the police.  Find out how to file the correct papers with the authorities. This is important for all assaults, even if they were committed by a family member or friend.  Find out where you can get additional help and support, such as a local rape crisis center.  Follow up with your health care provider as directed.  HOW CAN YOU REDUCE THE CHANCES OF SEXUAL ASSAULT? Take the following steps to help reduce your chances of being sexually assaulted:  Consider carrying mace or pepper spray for protection against an attacker.   Consider taking a self-defense course.  Do not try to fight off an attacker if he or she has a gun or knife.   Be aware of your surroundings, what is happening around you, and who might be there.   Be assertive, trust your instincts, and walk with confidence and direction.  Be careful not to drink too much alcohol or use other intoxicants. These can reduce your ability to fight off an assault.  Always lock your doors and windows. Be sure to have high-quality locks for your home.   Do not let people enter your house if you do not know them.   Get a home security system that has a siren if you are able.   Protect the keys to your house and car. Do not lend them out. Do not put your name and address on them. If you lose them, get your locks changed.   Always lock your car and have your key ready to open the door before approaching the car.   Park in a well-lit and  busy area.  Plan your driving routes so that you travel on well-lit and frequently used streets.  Keep your car serviced. Always have at least half a tank of gas in it.   Do not go into isolated areas alone. This includes open garages, empty buildings or offices, or [employer].   Do not walk or jog alone, especially when it is dark.   Never hitchhike.   If your car breaks down, call the police for help on your cell phone and stay inside the car with your doors locked and windows up.   If you are being followed, go to a busy area and call for help.   If you are stopped by a police officer, especially one in an unmarked police car, keep your door locked. Do not put your window down all the way. Ask the officer to show you identification first.   Be aware of "  date rape drugs" that can be placed in a drink when you are not looking. These drugs can make you unable to fight off an assault. FOR MORE INFORMATION  Office on [employer], [place] Department of Health and Human Services: [employer]  National Sexual Assault Hotline: 1-800-656-HOPE 307-140-0491)  National Domestic Violence Hotline: 1-800-799-SAFE 248-228-7092) or www.thehotline.org   This information is not intended to replace advice given to you by your health care provider. Make sure you discuss any questions you have with your health care provider.   Document Released: 08/21/2000 Document Revised: 04/26/2013 Document Reviewed: 03/29/2015 Elsevier Interactive Patient Education [employer]

## 2015-09-30 NOTE — ED Notes (Signed)
Patient is back, states she thought about leaving but knows that she needs to be checked out and has returned.

## 2015-09-30 NOTE — ED Notes (Signed)
Sane nurse notified will see pt.
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
## 2016-01-11 ENCOUNTER — Encounter (HOSPITAL_COMMUNITY): Payer: Self-pay | Admitting: *Deleted

## 2016-01-11 ENCOUNTER — Emergency Department (HOSPITAL_COMMUNITY)
Admission: EM | Admit: 2016-01-11 | Discharge: 2016-01-12 | Disposition: A | Discharge status: Home / Self Care | Payer: Self-pay | Attending: Emergency Medicine | Admitting: Emergency Medicine

## 2016-01-11 DIAGNOSIS — L0390 Cellulitis, unspecified: Secondary | ICD-10-CM

## 2016-01-11 DIAGNOSIS — J45901 Unspecified asthma with (acute) exacerbation: Secondary | ICD-10-CM | POA: Insufficient documentation

## 2016-01-11 DIAGNOSIS — L03311 Cellulitis of abdominal wall: Secondary | ICD-10-CM | POA: Insufficient documentation

## 2016-01-11 DIAGNOSIS — E119 Type 2 diabetes mellitus without complications: Secondary | ICD-10-CM | POA: Insufficient documentation

## 2016-01-11 DIAGNOSIS — L02211 Cutaneous abscess of abdominal wall: Secondary | ICD-10-CM | POA: Insufficient documentation

## 2016-01-11 DIAGNOSIS — F17210 Nicotine dependence, cigarettes, uncomplicated: Secondary | ICD-10-CM | POA: Insufficient documentation

## 2016-01-11 DIAGNOSIS — L0291 Cutaneous abscess, unspecified: Secondary | ICD-10-CM

## 2016-01-11 DIAGNOSIS — Z79899 Other long term (current) drug therapy: Secondary | ICD-10-CM | POA: Insufficient documentation

## 2016-01-11 DIAGNOSIS — Z794 Long term (current) use of insulin: Secondary | ICD-10-CM | POA: Insufficient documentation

## 2016-01-11 NOTE — ED Provider Notes (Signed)
CSN: [id number]     Arrival date & time 01/11/16  2300 History   First MD Initiated Contact with Patient 01/11/16 2349     Chief Complaint  Patient presents with  . abscess      (Consider location/radiation/quality/duration/timing/severity/associated sxs/prior Treatment) HPI  Pt has a PMH of diabetes, asthma and allergies. She comes to the ER complaining of abscess to her abdomen for 1 week that has been gradually worsening. She is a diabetic and reports her glucose results have been in the 300's, she has not had any N/V/D, fevers, chills or weakness. She has had some SOB and wheezing and has run out of her albuterol inhaler and requests a refill. She reports that this is her first abscess.  PCP: Carolyn CashingMCKENZIE, WAYLAND, MD Carolyn Norton is a 50 y.o.  female  ROS: The patient denies diaphoresis, fever, headache, weakness (general or focal), confusion, change of vision, dysphagia, aphagia, shortness of breath,  abdominal pains, nausea, vomiting, diarrhea, lower extremity swelling, neck pain, chest pain  Past Medical History  Diagnosis Date  . Asthma   . Diabetes mellitus without complication (HCC)   . Seasonal allergies    History reviewed. No pertinent past surgical history. No family history on file. Social History  Substance Use Topics  . Smoking status: Current Every Day Smoker -- 0.50 packs/day    Types: Cigarettes  . Smokeless tobacco: Never Used  . Alcohol Use: No   OB History    No data available     Review of Systems  Review of Systems All other systems negative except as documented in the HPI. All pertinent positives and negatives as reviewed in the HPI.   Allergies  Erythromycin  Home Medications   Prior to Admission medications   Medication Sig Start Date End Date Taking? Authorizing Provider  albuterol (PROVENTIL HFA;VENTOLIN HFA) 108 (90 BASE) MCG/ACT inhaler Inhale 2 puffs into the lungs every 4 (four) hours as needed for wheezing. 10/23/14  Yes Mirian MoMatthew  Gentry, MD  insulin glargine (LANTUS) 100 UNIT/ML injection Inject 50 Units into the skin at bedtime.   Yes Historical Provider, MD  insulin lispro (HUMALOG) 100 UNIT/ML injection Inject 4-20 Units into the skin 3 (three) times daily with meals. On a sliding scale   Yes Historical Provider, MD  LISINOPRIL PO Take 1 tablet by mouth daily.   Yes Historical Provider, MD  montelukast (SINGULAIR) 10 MG tablet Take 1 tablet (10 mg total) by mouth at bedtime. 10/23/14  Yes Mirian MoMatthew Gentry, MD  rosuvastatin (CRESTOR) 10 MG tablet Take 10 mg by mouth at bedtime.   Yes Historical Provider, MD  venlafaxine (EFFEXOR) 75 MG tablet Take 75 mg by mouth 3 (three) times daily with meals.   Yes Historical Provider, MD  acetaminophen (TYLENOL) 500 MG tablet Take 1 tablet (500 mg total) by mouth every 6 (six) hours as needed. 01/12/16   Kingsley Farace Neva SeatGreene, PA-C  clindamycin (CLEOCIN) 300 MG capsule Take 1 capsule (300 mg total) by mouth 3 (three) times daily. 01/12/16   Junior Kenedy Neva SeatGreene, PA-C  doxycycline (VIBRAMYCIN) 100 MG capsule Take 1 capsule (100 mg total) by mouth 2 (two) times daily. One po bid x 7 days Patient not taking: Reported on 01/12/2016 08/01/15   Geoffery Lyonsouglas Delo, MD  predniSONE (DELTASONE) 10 MG tablet Take 2 tablets (20 mg total) by mouth 2 (two) times daily. Patient not taking: Reported on 01/12/2016 08/01/15   Geoffery Lyonsouglas Delo, MD   BP 138/71 mmHg  Pulse 97  Temp(Src) 98.1 F (36.7  C) (Oral)  Resp 16  Ht 5\' 4"  (1.626 m)  Wt 93.94 kg  BMI 35.53 kg/m2  SpO2 98%  LMP 01/06/2016 Physical Exam  Constitutional: She appears well-developed and well-nourished. No distress.  HENT:  Head: Normocephalic and atraumatic.  Eyes: Pupils are equal, round, and reactive to light.  Neck: Normal range of motion. Neck supple.  Cardiovascular: Normal rate and regular rhythm.   Pulmonary/Chest: Effort normal. No accessory muscle usage. No respiratory distress. She has no decreased breath sounds. She has wheezes (diffuse expiratory  wheezing). She has no rhonchi. She has no rales.  Abdominal: Soft. Bowel sounds are normal. There is no tenderness. There is no rigidity, no rebound, no guarding and no CVA tenderness.    Neurological: She is alert.  Skin: Skin is warm and dry.  Nursing note and vitals reviewed.   ED Course  Procedures (including critical care time) Labs Review Labs Reviewed  CBC WITH DIFFERENTIAL/PLATELET - Abnormal; Notable for the following:    WBC 14.1 (*)    MCH 25.6 (*)    RDW 16.1 (*)    Neutro Abs 9.5 (*)    Monocytes Absolute 1.2 (*)    All other components within normal limits  BASIC METABOLIC PANEL - Abnormal; Notable for the following:    Sodium 133 (*)    Chloride 100 (*)    Glucose, Bld 357 (*)    All other components within normal limits  CBG MONITORING, ED - Abnormal; Notable for the following:    Glucose-Capillary 331 (*)    All other components within normal limits  CBG MONITORING, ED - Abnormal; Notable for the following:    Glucose-Capillary 264 (*)    All other components within normal limits    Imaging Review No results found. I have personally reviewed and evaluated these images and lab results as part of my medical decision-making.   EKG Interpretation None      MDM   Final diagnoses:  Abscess and cellulitis  Asthma exacerbation    Non acidotic. Fluids given and glucose has improved to 264.  INCISION AND DRAINAGE Performed by: Dorthula Matas Consent: Verbal consent obtained. Risks and benefits: risks, benefits and alternatives were discussed Type: abscess  Body area: abdominal wall  Anesthesia: local infiltration  Incision was made with a scalpel.  Local anesthetic: lidocaine 2% with epinephrine  Anesthetic total: 3 ml  Complexity: complex Blunt dissection to break up loculations  Drainage: purulent  Drainage amount: scant  Packing material: none  Patient tolerance: Patient tolerated the procedure well with no immediate  complications.   Medications  sodium chloride 0.9 % bolus 1,000 mL (1,000 mLs Intravenous New Bag/Given 01/12/16 0210)  albuterol (PROVENTIL HFA;VENTOLIN HFA) 108 (90 Base) MCG/ACT inhaler 2 puff (not administered)  sodium chloride 0.9 % bolus 1,000 mL (0 mLs Intravenous Stopped 01/12/16 0149)  lidocaine-EPINEPHrine (XYLOCAINE W/EPI) 2 %-1:200000 (PF) injection 20 mL (20 mLs Other Given 01/12/16 0049)  albuterol (PROVENTIL) (2.5 MG/3ML) 0.083% nebulizer solution 5 mg (5 mg Nebulization Given 01/12/16 0049)  ipratropium (ATROVENT) nebulizer solution 0.5 mg (0.5 mg Nebulization Given 01/12/16 0049)  oxyCODONE-acetaminophen (PERCOCET/ROXICET) 5-325 MG per tablet 1 tablet (1 tablet Oral Given 01/12/16 0206)  clindamycin (CLEOCIN) capsule 300 mg (300 mg Oral Given 01/12/16 0206)   Drainage was performed on 2 different sites, very minimal pus was expressed. There was significant improvement of the surrounding redness and improvement of patient pain. She's given a dose of clindamycin in the ER today. She is high risk  because she does not have a primary care doctor and she is diabetic. Therefore she has been advised to return in 24-48 hours for abscess wound recheck. She seems reliable and agrees to come back to have it looked at again and sooner if it worsens or she develops new symptoms;   Marlon Pel, PA-C 01/12/16 0300  Azalia Bilis, MD 01/12/16 [phone]

## 2016-01-11 NOTE — ED Notes (Signed)
Patient presents with abscess to her abd for about 1 week and has been trying all different remedies without success.  Also needs her inhaler filled

## 2016-01-12 LAB — CBC WITH DIFFERENTIAL/PLATELET
BASOS ABS: 0 10*3/uL (ref 0.0–0.1)
BASOS PCT: 0 %
EOS ABS: 0.4 10*3/uL (ref 0.0–0.7)
EOS PCT: 3 %
HEMATOCRIT: 39.9 % (ref 36.0–46.0)
Hemoglobin: 12.8 g/dL (ref 12.0–15.0)
Lymphocytes Relative: 21 %
Lymphs Abs: 2.9 10*3/uL (ref 0.7–4.0)
MCH: 25.6 pg — ABNORMAL LOW (ref 26.0–34.0)
MCHC: 32.1 g/dL (ref 30.0–36.0)
MCV: 79.8 fL (ref 78.0–100.0)
MONO ABS: 1.2 10*3/uL — AB (ref 0.1–1.0)
MONOS PCT: 9 %
Neutro Abs: 9.5 10*3/uL — ABNORMAL HIGH (ref 1.7–7.7)
Neutrophils Relative %: 67 %
PLATELETS: 394 10*3/uL (ref 150–400)
RBC: 5 MIL/uL (ref 3.87–5.11)
RDW: 16.1 % — AB (ref 11.5–15.5)
WBC: 14.1 10*3/uL — ABNORMAL HIGH (ref 4.0–10.5)

## 2016-01-12 LAB — BASIC METABOLIC PANEL
Anion gap: 8 (ref 5–15)
BUN: 8 mg/dL (ref 6–20)
CALCIUM: 8.9 mg/dL (ref 8.9–10.3)
CO2: 25 mmol/L (ref 22–32)
CREATININE: 0.98 mg/dL (ref 0.44–1.00)
Chloride: 100 mmol/L — ABNORMAL LOW (ref 101–111)
GFR calc non Af Amer: >60 mL/min (ref >60)
GLUCOSE: 357 mg/dL — AB (ref 65–99)
Potassium: 4.3 mmol/L (ref 3.5–5.1)
Sodium: 133 mmol/L — ABNORMAL LOW (ref 135–145)

## 2016-01-12 LAB — CBG MONITORING, ED
GLUCOSE-CAPILLARY: 331 mg/dL — AB (ref 65–99)
Glucose-Capillary: 264 mg/dL — ABNORMAL HIGH (ref 65–99)

## 2016-01-12 MED ORDER — OXYCODONE-ACETAMINOPHEN 5-325 MG PO TABS
1.0000 | ORAL_TABLET | Freq: Once | ORAL | Status: AC
  Administered 2016-01-12: 1 via ORAL
  Filled 2016-01-12: qty 1

## 2016-01-12 MED ORDER — SODIUM CHLORIDE 0.9 % IV BOLUS (SEPSIS)
1000.0000 mL | Freq: Once | INTRAVENOUS | Status: AC
  Administered 2016-01-12: 1000 mL via INTRAVENOUS

## 2016-01-12 MED ORDER — LIDOCAINE-EPINEPHRINE (PF) 2 %-1:200000 IJ SOLN
20.0000 mL | Freq: Once | INTRAMUSCULAR | Status: AC
  Administered 2016-01-12: 20 mL
  Filled 2016-01-12: qty 20

## 2016-01-12 MED ORDER — CLINDAMYCIN HCL 300 MG PO CAPS: 300.0000 mg | ORAL_CAPSULE | Freq: Three times a day (TID) | ORAL | Status: DC

## 2016-01-12 MED ORDER — ALBUTEROL SULFATE (2.5 MG/3ML) 0.083% IN NEBU
5.0000 mg | INHALATION_SOLUTION | Freq: Once | RESPIRATORY_TRACT | Status: AC
  Administered 2016-01-12: 5 mg via RESPIRATORY_TRACT
  Filled 2016-01-12: qty 6

## 2016-01-12 MED ORDER — ACETAMINOPHEN 500 MG PO TABS: 500.0000 mg | ORAL_TABLET | Freq: Four times a day (QID) | ORAL | Status: DC | PRN

## 2016-01-12 MED ORDER — CLINDAMYCIN HCL 150 MG PO CAPS
300.0000 mg | ORAL_CAPSULE | Freq: Once | ORAL | Status: AC
  Administered 2016-01-12: 300 mg via ORAL
  Filled 2016-01-12: qty 2

## 2016-01-12 MED ORDER — IPRATROPIUM BROMIDE 0.02 % IN SOLN
0.5000 mg | Freq: Once | RESPIRATORY_TRACT | Status: AC
  Administered 2016-01-12: 0.5 mg via RESPIRATORY_TRACT
  Filled 2016-01-12: qty 2.5

## 2016-01-12 MED ORDER — ALBUTEROL SULFATE HFA 108 (90 BASE) MCG/ACT IN AERS
2.0000 | INHALATION_SPRAY | RESPIRATORY_TRACT | Status: DC | PRN
  Administered 2016-01-12: 2 via RESPIRATORY_TRACT
  Filled 2016-01-12: qty 6.7

## 2016-01-12 NOTE — Discharge Instructions (Signed)
Incision and Drainage °Incision and drainage is a procedure in which a sac-like structure (cystic structure) is opened and drained. The area to be drained usually contains material such as pus, fluid, or blood.  °LET YOUR CAREGIVER KNOW ABOUT:  °· Allergies to medicine. °· Medicines taken, including vitamins, herbs, eyedrops, over-the-counter medicines, and creams. °· Use of steroids (by mouth or creams). °· Previous problems with anesthetics or numbing medicines. °· History of bleeding problems or blood clots. °· Previous surgery. °· Other health problems, including diabetes and kidney problems. °· Possibility of pregnancy, if this applies. °RISKS AND COMPLICATIONS °· Pain. °· Bleeding. °· Scarring. °· Infection. °BEFORE THE PROCEDURE  °You may need to have an ultrasound or other imaging tests to see how large or deep your cystic structure is. Blood tests may also be used to determine if you have an infection or how severe the infection is. You may need to have a tetanus shot. °PROCEDURE  °The affected area is cleaned with a cleaning fluid. The cyst area will then be numbed with a medicine (local anesthetic). A small incision will be made in the cystic structure. A syringe or catheter may be used to drain the contents of the cystic structure, or the contents may be squeezed out. The area will then be flushed with a cleansing solution. After cleansing the area, it is often gently packed with a gauze or another wound dressing. Once it is packed, it will be covered with gauze and tape or some other type of wound dressing.  °AFTER THE PROCEDURE  °· Often, you will be allowed to go home right after the procedure. °· You may be given antibiotic medicine to prevent or heal an infection. °· If the area was packed with gauze or some other wound dressing, you will likely need to come back in 1 to 2 days to get it removed. °· The area should heal in about 14 days. °  °This information is not intended to replace advice given  to you by your health care provider. Make sure you discuss any questions you have with your health care provider. °  °Document Released: 02/17/2001 Document Revised: 02/23/2012 Document Reviewed: 10/19/2011 °Elsevier Interactive Patient Education ©2016 [employer] ° °Abscess °An abscess is an infected area that contains a collection of pus and debris. It can occur in almost any part of the body. An abscess is also known as a furuncle or boil. °CAUSES  °An abscess occurs when tissue gets infected. This can occur from blockage of oil or sweat glands, infection of hair follicles, or a minor injury to the skin. As the body tries to fight the infection, pus collects in the area and creates pressure under the skin. This pressure causes pain. People with weakened immune systems have difficulty fighting infections and get certain abscesses more often.  °SYMPTOMS °Usually an abscess develops on the skin and becomes a painful mass that is red, warm, and tender. If the abscess forms under the skin, you may feel a moveable soft area under the skin. Some abscesses break open (rupture) on their own, but most will continue to get worse without care. The infection can spread deeper into the body and eventually into the bloodstream, causing you to feel ill.  °DIAGNOSIS  °Your caregiver will take your medical history and perform a physical exam. A sample of fluid may also be taken from the abscess to determine what is causing your infection. °TREATMENT  °Your caregiver may prescribe antibiotic medicines to fight the infection.   However, taking antibiotics alone usually does not cure an abscess. Your caregiver may need to make a small cut (incision) in the abscess to drain the pus. In some cases, gauze is packed into the abscess to reduce pain and to continue draining the area. °HOME CARE INSTRUCTIONS  °· Only take over-the-counter or prescription medicines for pain, discomfort, or fever as directed by your caregiver. °· If you were  prescribed antibiotics, take them as directed. Finish them even if you start to feel better. °· If gauze is used, follow your caregiver's directions for changing the gauze. °· To avoid spreading the infection: °· Keep your draining abscess covered with a bandage. °· Wash your hands well. °· Do not share personal care items, towels, or whirlpools with others. °· Avoid skin contact with others. °· Keep your skin and clothes clean around the abscess. °· Keep all follow-up appointments as directed by your caregiver. °SEEK MEDICAL CARE IF:  °· You have increased pain, swelling, redness, fluid drainage, or bleeding. °· You have muscle aches, chills, or a general ill feeling. °· You have a fever. °MAKE SURE YOU:  °· Understand these instructions. °· Will watch your condition. °· Will get help right away if you are not doing well or get worse. °  °This information is not intended to replace advice given to you by your health care provider. Make sure you discuss any questions you have with your health care provider. °  °Document Released: 06/03/2005 Document Revised: 02/23/2012 Document Reviewed: 11/06/2011 °Elsevier Interactive Patient Education ©2016 [employer] ° °Cellulitis °Cellulitis is an infection of the skin and the tissue beneath it. The infected area is usually red and tender. Cellulitis occurs most often in the arms and lower legs.  °CAUSES  °Cellulitis is caused by bacteria that enter the skin through cracks or cuts in the skin. The most common types of bacteria that cause cellulitis are staphylococci and streptococci. °SIGNS AND SYMPTOMS  °· Redness and warmth. °· Swelling. °· Tenderness or pain. °· Fever. °DIAGNOSIS  °Your health care provider can usually determine what is wrong based on a physical exam. Blood tests may also be done. °TREATMENT  °Treatment usually involves taking an antibiotic medicine. °HOME CARE INSTRUCTIONS  °· Take your antibiotic medicine as directed by your health care provider. Finish  the antibiotic even if you start to feel better. °· Keep the infected arm or leg elevated to reduce swelling. °· Apply a warm cloth to the affected area up to 4 times per day to relieve pain. °· Take medicines only as directed by your health care provider. °· Keep all follow-up visits as directed by your health care provider. °SEEK MEDICAL CARE IF:  °· You notice red streaks coming from the infected area. °· Your red area gets larger or turns dark in color. °· Your bone or joint underneath the infected area becomes painful after the skin has healed. °· Your infection returns in the same area or another area. °· You notice a swollen bump in the infected area. °· You develop new symptoms. °· You have a fever. °SEEK IMMEDIATE MEDICAL CARE IF:  °· You feel very sleepy. °· You develop vomiting or diarrhea. °· You have a general ill feeling (malaise) with muscle aches and pains. °  °This information is not intended to replace advice given to you by your health care provider. Make sure you discuss any questions you have with your health care provider. °  °Document Released: 06/03/2005 Document Revised: 05/15/2015 Document   Reviewed: 11/09/2011 °Elsevier Interactive Patient Education ©2016 [employer] ° °

## 2016-01-12 NOTE — ED Notes (Signed)
CHECKED CBG 264, RN JANEE INFORMED

## 2016-01-12 NOTE — ED Notes (Signed)
Patient states she need a inhaler.

## 2016-10-20 ENCOUNTER — Emergency Department (HOSPITAL_COMMUNITY)
Admission: EM | Admit: 2016-10-20 | Discharge: 2016-10-20 | Disposition: A | Discharge status: Home / Self Care | Payer: No Typology Code available for payment source | Attending: Emergency Medicine | Admitting: Emergency Medicine

## 2016-10-20 ENCOUNTER — Emergency Department (HOSPITAL_COMMUNITY): Payer: No Typology Code available for payment source

## 2016-10-20 ENCOUNTER — Encounter (HOSPITAL_COMMUNITY): Payer: Self-pay | Admitting: Emergency Medicine

## 2016-10-20 DIAGNOSIS — S8991XA Unspecified injury of right lower leg, initial encounter: Secondary | ICD-10-CM | POA: Insufficient documentation

## 2016-10-20 DIAGNOSIS — Z79899 Other long term (current) drug therapy: Secondary | ICD-10-CM | POA: Insufficient documentation

## 2016-10-20 DIAGNOSIS — E119 Type 2 diabetes mellitus without complications: Secondary | ICD-10-CM | POA: Insufficient documentation

## 2016-10-20 DIAGNOSIS — F17210 Nicotine dependence, cigarettes, uncomplicated: Secondary | ICD-10-CM | POA: Insufficient documentation

## 2016-10-20 DIAGNOSIS — Y939 Activity, unspecified: Secondary | ICD-10-CM | POA: Diagnosis not present

## 2016-10-20 DIAGNOSIS — Z5321 Procedure and treatment not carried out due to patient leaving prior to being seen by health care provider: Secondary | ICD-10-CM | POA: Insufficient documentation

## 2016-10-20 DIAGNOSIS — J45909 Unspecified asthma, uncomplicated: Secondary | ICD-10-CM | POA: Insufficient documentation

## 2016-10-20 DIAGNOSIS — Y92410 Unspecified street and highway as the place of occurrence of the external cause: Secondary | ICD-10-CM | POA: Diagnosis not present

## 2016-10-20 DIAGNOSIS — Y999 Unspecified external cause status: Secondary | ICD-10-CM | POA: Insufficient documentation

## 2016-10-20 DIAGNOSIS — R05 Cough: Secondary | ICD-10-CM | POA: Diagnosis not present

## 2016-10-20 DIAGNOSIS — Z794 Long term (current) use of insulin: Secondary | ICD-10-CM | POA: Insufficient documentation

## 2016-10-20 MED ORDER — ALBUTEROL SULFATE (2.5 MG/3ML) 0.083% IN NEBU
INHALATION_SOLUTION | RESPIRATORY_TRACT | Status: AC
  Filled 2016-10-20: qty 6

## 2016-10-20 MED ORDER — ALBUTEROL SULFATE (2.5 MG/3ML) 0.083% IN NEBU
5.0000 mg | INHALATION_SOLUTION | Freq: Once | RESPIRATORY_TRACT | Status: AC
  Administered 2016-10-20: 5 mg via RESPIRATORY_TRACT
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
## 2016-10-21 ENCOUNTER — Emergency Department (HOSPITAL_COMMUNITY)
Admission: EM | Admit: 2016-10-21 | Discharge: 2016-10-21 | Disposition: A | Discharge status: Home / Self Care | Payer: No Typology Code available for payment source | Attending: Emergency Medicine | Admitting: Emergency Medicine

## 2016-10-21 ENCOUNTER — Encounter (HOSPITAL_COMMUNITY): Payer: Self-pay | Admitting: Emergency Medicine

## 2016-10-21 ENCOUNTER — Emergency Department (HOSPITAL_COMMUNITY): Payer: No Typology Code available for payment source

## 2016-10-21 DIAGNOSIS — E119 Type 2 diabetes mellitus without complications: Secondary | ICD-10-CM | POA: Insufficient documentation

## 2016-10-21 DIAGNOSIS — Z794 Long term (current) use of insulin: Secondary | ICD-10-CM | POA: Insufficient documentation

## 2016-10-21 DIAGNOSIS — Y999 Unspecified external cause status: Secondary | ICD-10-CM | POA: Insufficient documentation

## 2016-10-21 DIAGNOSIS — M25561 Pain in right knee: Secondary | ICD-10-CM

## 2016-10-21 DIAGNOSIS — J45901 Unspecified asthma with (acute) exacerbation: Secondary | ICD-10-CM | POA: Diagnosis not present

## 2016-10-21 DIAGNOSIS — Y939 Activity, unspecified: Secondary | ICD-10-CM | POA: Diagnosis not present

## 2016-10-21 DIAGNOSIS — M545 Low back pain, unspecified: Secondary | ICD-10-CM

## 2016-10-21 DIAGNOSIS — R05 Cough: Secondary | ICD-10-CM | POA: Insufficient documentation

## 2016-10-21 DIAGNOSIS — Z79899 Other long term (current) drug therapy: Secondary | ICD-10-CM | POA: Insufficient documentation

## 2016-10-21 DIAGNOSIS — R109 Unspecified abdominal pain: Secondary | ICD-10-CM | POA: Diagnosis not present

## 2016-10-21 DIAGNOSIS — Y92410 Unspecified street and highway as the place of occurrence of the external cause: Secondary | ICD-10-CM | POA: Insufficient documentation

## 2016-10-21 DIAGNOSIS — F17210 Nicotine dependence, cigarettes, uncomplicated: Secondary | ICD-10-CM | POA: Diagnosis not present

## 2016-10-21 DIAGNOSIS — S3992XA Unspecified injury of lower back, initial encounter: Secondary | ICD-10-CM | POA: Diagnosis not present

## 2016-10-21 DIAGNOSIS — S8991XA Unspecified injury of right lower leg, initial encounter: Secondary | ICD-10-CM | POA: Diagnosis not present

## 2016-10-21 LAB — I-STAT CHEM 8, ED
BUN: 10 mg/dL (ref 6–20)
CALCIUM ION: 1.06 mmol/L — AB (ref 1.15–1.40)
CREATININE: 1 mg/dL (ref 0.44–1.00)
Chloride: 101 mmol/L (ref 101–111)
Glucose, Bld: 125 mg/dL — ABNORMAL HIGH (ref 65–99)
HEMATOCRIT: 36 % (ref 36.0–46.0)
HEMOGLOBIN: 12.2 g/dL (ref 12.0–15.0)
Potassium: 3.6 mmol/L (ref 3.5–5.1)
SODIUM: 140 mmol/L (ref 135–145)
TCO2: 28 mmol/L (ref 0–100)

## 2016-10-21 LAB — CBG MONITORING, ED
GLUCOSE-CAPILLARY: 37 mg/dL — AB (ref 65–99)
Glucose-Capillary: 91 mg/dL (ref 65–99)

## 2016-10-21 LAB — I-STAT BETA HCG BLOOD, ED (MC, WL, AP ONLY): I-stat hCG, quantitative: <5 m[IU]/mL (ref <5)

## 2016-10-21 MED ORDER — PREDNISONE 20 MG PO TABS
60.0000 mg | ORAL_TABLET | Freq: Once | ORAL | Status: AC
Start: 2016-10-21 — End: 2016-10-21
  Administered 2016-10-21: 60 mg via ORAL
  Filled 2016-10-21: qty 3

## 2016-10-21 MED ORDER — SODIUM CHLORIDE 0.9 % IV BOLUS (SEPSIS)
500.0000 mL | Freq: Once | INTRAVENOUS | Status: AC
  Administered 2016-10-21: 500 mL via INTRAVENOUS

## 2016-10-21 MED ORDER — ALBUTEROL SULFATE HFA 108 (90 BASE) MCG/ACT IN AERS
2.0000 | INHALATION_SPRAY | RESPIRATORY_TRACT | Status: DC | PRN
  Administered 2016-10-21: 2 via RESPIRATORY_TRACT
  Filled 2016-10-21: qty 6.7

## 2016-10-21 MED ORDER — METHOCARBAMOL 500 MG PO TABS
500.0000 mg | ORAL_TABLET | Freq: Once | ORAL | Status: AC
  Administered 2016-10-21: 500 mg via ORAL
  Filled 2016-10-21: qty 1

## 2016-10-21 MED ORDER — PREDNISONE 20 MG PO TABS: 40.0000 mg | ORAL_TABLET | Freq: Every day | ORAL | 0 refills | Status: DC

## 2016-10-21 MED ORDER — METHOCARBAMOL 500 MG PO TABS: 500.0000 mg | ORAL_TABLET | Freq: Two times a day (BID) | ORAL | 0 refills | Status: DC

## 2016-10-21 MED ORDER — ALBUTEROL SULFATE (2.5 MG/3ML) 0.083% IN NEBU: 2.5000 mg | INHALATION_SOLUTION | Freq: Four times a day (QID) | RESPIRATORY_TRACT | 12 refills | Status: DC | PRN

## 2016-10-21 MED ORDER — IOPAMIDOL (ISOVUE-300) INJECTION 61%
INTRAVENOUS | Status: AC
  Administered 2016-10-21: 100 mL
  Filled 2016-10-21: qty 100

## 2016-10-21 MED ORDER — ALBUTEROL SULFATE HFA 108 (90 BASE) MCG/ACT IN AERS: 2.0000 | INHALATION_SPRAY | RESPIRATORY_TRACT | 3 refills | Status: DC | PRN

## 2016-10-21 MED ORDER — ALBUTEROL SULFATE (2.5 MG/3ML) 0.083% IN NEBU
5.0000 mg | INHALATION_SOLUTION | Freq: Once | RESPIRATORY_TRACT | Status: AC
  Administered 2016-10-21: 5 mg via RESPIRATORY_TRACT
  Filled 2016-10-21: qty 6

## 2016-10-21 MED ORDER — AEROCHAMBER PLUS FLO-VU LARGE MISC
1.0000 | Freq: Once | Status: AC
  Administered 2016-10-21: 1
  Filled 2016-10-21: qty 1

## 2016-10-21 MED ORDER — IPRATROPIUM BROMIDE 0.02 % IN SOLN
0.5000 mg | Freq: Once | RESPIRATORY_TRACT | Status: AC
Start: 2016-10-21 — End: 2016-10-21
  Administered 2016-10-21: 0.5 mg via RESPIRATORY_TRACT
  Filled 2016-10-21: qty 2.5
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
## 2016-10-21 NOTE — ED Notes (Signed)
Patient transported to CT 
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
## 2016-10-21 NOTE — ED Notes (Signed)
While discharging pt she reports "sugar feels low". CBG 37. Pt A&Ox4. Warm and dry NAD. Pt given juice and peanut butter crackers. Tolerating well.  Will recheck CBG soon.  Pt states she used insulin last night and forgot to eat.

## 2016-10-21 NOTE — Discharge Instructions (Signed)
1. Medications: albuterol, prednisone, Robaxin for muscle spasms and back pain; usual home medications 2. Treatment: rest, drink plenty of fluids, begin OTC antihistamine (Zyrtec or Claritin), ibuprofen and Tylenol for back pain. Gentle stretching.  3. Follow Up: Please followup with your primary doctor in 2-3 days for discussion of your diagnoses and further evaluation after today's visit; if you do not have a primary care doctor use the resource guide provided to find one; Please return to the ER for difficulty breathing, high fevers or worsening symptoms.

## 2017-01-05 ENCOUNTER — Other Ambulatory Visit: Payer: Self-pay | Admitting: Obstetrics and Gynecology

## 2017-01-05 DIAGNOSIS — Z1231 Encounter for screening mammogram for malignant neoplasm of breast: Secondary | ICD-10-CM

## 2017-01-26 ENCOUNTER — Ambulatory Visit
Admission: RE | Admit: 2017-01-26 | Discharge: 2017-01-26 | Disposition: A | Discharge status: Home / Self Care | Payer: No Typology Code available for payment source | Source: Ambulatory Visit | Attending: Obstetrics and Gynecology | Admitting: Obstetrics and Gynecology

## 2017-01-26 ENCOUNTER — Encounter (HOSPITAL_COMMUNITY): Payer: Self-pay

## 2017-01-26 ENCOUNTER — Ambulatory Visit (HOSPITAL_COMMUNITY)
Admission: RE | Admit: 2017-01-26 | Discharge: 2017-01-26 | Disposition: A | Discharge status: Home / Self Care | Payer: Self-pay | Source: Ambulatory Visit | Attending: Obstetrics and Gynecology | Admitting: Obstetrics and Gynecology

## 2017-01-26 VITALS — BP 150/82 | Temp 97.8°F | Ht 65.0 in | Wt 210.8 lb

## 2017-01-26 DIAGNOSIS — Z1231 Encounter for screening mammogram for malignant neoplasm of breast: Secondary | ICD-10-CM

## 2017-01-26 DIAGNOSIS — Z01419 Encounter for gynecological examination (general) (routine) without abnormal findings: Secondary | ICD-10-CM

## 2017-01-26 HISTORY — DX: Essential (primary) hypertension: I10

## 2017-01-26 NOTE — Patient Instructions (Addendum)
Explained breast self awareness with Carolyn FavorMichelle Norton. Unable to complete patients Pap smear due to patient is currently on menstrual cycle. [facility] at 1530 for a Pap smear. [facility]. Appointment scheduled for Tuesday, Jan 26, 2017 at 1310. Let patient know the Breast Center will follow up with her within the next couple weeks with results of mammogram by letter or phone. Discussed smoking cessation with patient. Referred to the [facility] and gave resources to free smoking cessation classes at [facility]. Carolyn FavorMichelle Norton verbalized understanding.  Carolyn Norton, Carolyn Maserhristine Poll, RN 12:10 PM

## 2017-01-26 NOTE — Progress Notes (Signed)
No complaints today.   Pap Smear: Pap smear not completed today. Last Pap smear was 03/29/2009 and normal. Per patient has a history of an abnormal Pap smear 10 years ago that a colposcopy was completed for follow up. Patient's most recent Pap smear is the only Pap smear she has had since colposcopy. Patient is currently on her menstrual period therefore unable to complete Pap smear today. [facility] at 1530 for a Pap smear. Last Pap smear result is in EPIC.  Physical exam: Breasts Breasts symmetrical. No skin abnormalities bilateral breasts. No nipple retraction bilateral breasts. No nipple discharge bilateral breasts. No lymphadenopathy. No lumps palpated bilateral breasts. No complaints of pain or tenderness on exam. [facility] for a screening mamm[facility]. Appointment scheduled for Tuesday, Jan 26, 2017 at 1310.        Pelvic/Bimanual No Pap smear completed today since patient is currently on menstrual period.  Smoking History: Patient is a current smoker. Discussed smoking cessation with patient. Referred to the [facility] and gave resources to free smoking cessation classes at [facility].  Patient Navigation: Patient education provided. Access to services provided for [facility] through [facility].

## 2017-01-26 NOTE — Addendum Note (Signed)
Encounter addended by: Priscille HeidelbergBrannock, Graiden Henes P, RN on: 01/26/2017  2:42 PM<BR>    Actions taken: Sign clinical note

## 2017-01-26 NOTE — Addendum Note (Signed)
Encounter addended by: Priscille HeidelbergBrannock, Christine P, RN on: 01/26/2017  1:15 PM<BR>    Actions taken: Visit diagnoses modified

## 2017-02-02 ENCOUNTER — Encounter (HOSPITAL_COMMUNITY): Payer: Self-pay | Admitting: *Deleted

## 2017-02-23 ENCOUNTER — Ambulatory Visit (HOSPITAL_COMMUNITY)
Admission: RE | Admit: 2017-02-23 | Discharge: 2017-02-23 | Disposition: A | Discharge status: Home / Self Care | Payer: No Typology Code available for payment source | Source: Ambulatory Visit | Attending: Obstetrics and Gynecology | Admitting: Obstetrics and Gynecology

## 2017-02-23 ENCOUNTER — Encounter (HOSPITAL_COMMUNITY): Payer: Self-pay

## 2017-02-23 VITALS — BP 150/94 | Ht 65.0 in | Wt 210.8 lb

## 2017-02-23 DIAGNOSIS — Z01419 Encounter for gynecological examination (general) (routine) without abnormal findings: Secondary | ICD-10-CM

## 2017-02-23 NOTE — Patient Instructions (Signed)
Explained to Carolyn EnergyMichelle Norton that based on the result of today's Pap smear will determine when her next Pap smear will be due. Let patient know will follow up with her within the next couple weeks with results of Pap smear by letter or phone. Harl FavorMichelle Villeda verbalized understanding.  Brannock, Kathaleen Maserhristine Poll, RN 3:42 PM

## 2017-02-23 NOTE — Progress Notes (Signed)
No complaints today.   Pap Smear: Pap smear completed today. Last Pap smear was 03/29/2009 and normal. Per patient has a history of an abnormal Pap smear 10 years ago that a colposcopy was completed for follow up. Patient's most recent Pap smear is the only Pap smear she has had since colposcopy. Last Pap smear result is in EPIC.  Pelvic/Bimanual   Ext Genitalia No lesions, no swelling and no discharge observed on external genitalia.         Vagina Vagina pink and normal texture. No lesions or discharge observed in vagina.          Cervix Cervix is present. Cervix pink and of normal texture. No discharge observed.     Uterus Uterus is present and palpable. Uterus in normal position and normal size.        Adnexae Bilateral ovaries present and palpable. No tenderness on palpation.          Rectovaginal No rectal exam completed today since patient had no rectal complaints. No skin abnormalities observed on exam.    Smoking History: Patient is a current smoker. Discussed smoking cessation with patient. Referred to the [facility] Q[facility] and gave resources to free smoking cessation classes at [facility].  Patient Navigation: Patient education provided. [facility] services provided [facility].

## 2017-02-25 LAB — CYTOLOGY - PAP
Diagnosis: NEGATIVE
HPV: NOT DETECTED

## 2017-03-01 ENCOUNTER — Encounter (HOSPITAL_COMMUNITY): Payer: Self-pay | Admitting: *Deleted

## 2017-03-01 NOTE — Progress Notes (Signed)
Letter mailed to patient with negative pap smear results. Next pap smear due in one year.  

## 2017-08-09 ENCOUNTER — Ambulatory Visit: Payer: Self-pay | Attending: Internal Medicine | Admitting: Podiatry

## 2017-08-09 DIAGNOSIS — E118 Type 2 diabetes mellitus with unspecified complications: Secondary | ICD-10-CM

## 2017-08-09 DIAGNOSIS — L602 Onychogryphosis: Secondary | ICD-10-CM

## 2017-08-16 NOTE — Progress Notes (Signed)
Subjective:   Patient ID: Carolyn FavorMichelle Patricelli, female   DOB: 51 y.o.   MRN: [id number]   HPI Ms. Carolyn Norton presents the office today for diabetic foot evaluation.  She denies any history of ulceration.  She denies any claudication symptoms.  Today the office she does state that she feels nauseous.  She has not checked her blood sugar.  See note below.  She denies any numbness or tingling to her feet.  She has no other concerns today.  Review of Systems  All other systems reviewed and are negative.       Objective:  Physical Exam  General: AAO x3, NAD  Dermatological: Nails are , brittle, discolored with yellow to brown discoloration, elongated 10. No surrounding redness or drainage. Tenderness nails 1-5 bilaterally. No open lesions or pre-ulcerative lesions are identified today.  Vascular: Dorsalis Pedis artery and Posterior Tibial artery pedal pulses are 2/4 bilateral with immedate capillary fill time. There is no pain with calf compression, swelling, warmth, erythema.   Neruologic: Grossly intact via light touch bilateral.  Protective threshold with Semmes Wienstein monofilament intact to all pedal sites bilateral.   Musculoskeletal: No gross boney pedal deformities bilateral. No pain, crepitus, or limitation noted with foot and ankle range of motion bilateral. Muscular strength 5/5 in all groups tested bilateral.  Gait: Unassisted, Nonantalgic.       Assessment:   51 year old female presents for diabetic foot evaluation, elongated toenails     Plan:  -Treatment options discussed including all alternatives, risks, and complications -Etiology of symptoms were discussed -Nails were debrided x10 without any complications or bleeding. -We discussed daily foot inspection. -She was feeling nauseous.  Took her blood sugar which was 46.  She did have some applesauce in the office as well as an individual small piece of candy.  I was going to recheck her blood sugar make sure she felt  better however she left the office without any able to do this and she declined me checking her blood sugar again.  I encouraged her to make sure she takes her blood sugar before she takes her medication tonight to make sure it still not low before taking further insulin.  She has not eaten today.  Carolyn Norton

## 2017-12-12 ENCOUNTER — Encounter (HOSPITAL_COMMUNITY): Payer: Self-pay | Admitting: Emergency Medicine

## 2017-12-12 ENCOUNTER — Emergency Department (HOSPITAL_COMMUNITY): Payer: Self-pay

## 2017-12-12 ENCOUNTER — Other Ambulatory Visit: Payer: Self-pay

## 2017-12-12 ENCOUNTER — Emergency Department (HOSPITAL_COMMUNITY)
Admission: EM | Admit: 2017-12-12 | Discharge: 2017-12-12 | Disposition: A | Discharge status: Home / Self Care | Payer: Self-pay | Attending: Emergency Medicine | Admitting: Emergency Medicine

## 2017-12-12 DIAGNOSIS — Z79899 Other long term (current) drug therapy: Secondary | ICD-10-CM | POA: Insufficient documentation

## 2017-12-12 DIAGNOSIS — R05 Cough: Secondary | ICD-10-CM | POA: Insufficient documentation

## 2017-12-12 DIAGNOSIS — E119 Type 2 diabetes mellitus without complications: Secondary | ICD-10-CM | POA: Insufficient documentation

## 2017-12-12 DIAGNOSIS — R059 Cough, unspecified: Secondary | ICD-10-CM

## 2017-12-12 DIAGNOSIS — F17210 Nicotine dependence, cigarettes, uncomplicated: Secondary | ICD-10-CM | POA: Insufficient documentation

## 2017-12-12 DIAGNOSIS — I10 Essential (primary) hypertension: Secondary | ICD-10-CM | POA: Insufficient documentation

## 2017-12-12 DIAGNOSIS — Z794 Long term (current) use of insulin: Secondary | ICD-10-CM | POA: Insufficient documentation

## 2017-12-12 DIAGNOSIS — J45909 Unspecified asthma, uncomplicated: Secondary | ICD-10-CM | POA: Insufficient documentation

## 2017-12-12 LAB — I-STAT TROPONIN, ED: Troponin i, poc: 0 ng/mL (ref 0.00–0.08)

## 2017-12-12 LAB — I-STAT BETA HCG BLOOD, ED (MC, WL, AP ONLY): I-stat hCG, quantitative: <5 m[IU]/mL

## 2017-12-12 LAB — BASIC METABOLIC PANEL
ANION GAP: 11 (ref 5–15)
BUN: <5 mg/dL — ABNORMAL LOW (ref 6–20)
CALCIUM: 8.7 mg/dL — AB (ref 8.9–10.3)
CO2: 25 mmol/L (ref 22–32)
Chloride: 99 mmol/L — ABNORMAL LOW (ref 101–111)
Creatinine, Ser: 1.02 mg/dL — ABNORMAL HIGH (ref 0.44–1.00)
GFR calc Af Amer: >60 mL/min (ref >60)
GLUCOSE: 266 mg/dL — AB (ref 65–99)
POTASSIUM: 3.6 mmol/L (ref 3.5–5.1)
Sodium: 135 mmol/L (ref 135–145)

## 2017-12-12 LAB — CBC
HEMATOCRIT: 37.4 % (ref 36.0–46.0)
HEMOGLOBIN: 11.8 g/dL — AB (ref 12.0–15.0)
MCH: 24.4 pg — AB (ref 26.0–34.0)
MCHC: 31.6 g/dL (ref 30.0–36.0)
MCV: 77.4 fL — ABNORMAL LOW (ref 78.0–100.0)
Platelets: 411 10*3/uL — ABNORMAL HIGH (ref 150–400)
RBC: 4.83 MIL/uL (ref 3.87–5.11)
RDW: 16.9 % — AB (ref 11.5–15.5)
WBC: 9 10*3/uL (ref 4.0–10.5)

## 2017-12-12 MED ORDER — BENZONATATE 100 MG PO CAPS: 100.0000 mg | ORAL_CAPSULE | Freq: Three times a day (TID) | ORAL | 0 refills | Status: DC

## 2017-12-12 MED ORDER — PREDNISONE 20 MG PO TABS: 40.0000 mg | ORAL_TABLET | Freq: Every day | ORAL | 0 refills | Status: DC

## 2017-12-12 MED ORDER — PREDNISONE 20 MG PO TABS
40.0000 mg | ORAL_TABLET | Freq: Once | ORAL | Status: AC
  Administered 2017-12-12: 40 mg via ORAL
  Filled 2017-12-12: qty 2

## 2017-12-12 MED ORDER — ALBUTEROL SULFATE (2.5 MG/3ML) 0.083% IN NEBU
5.0000 mg | INHALATION_SOLUTION | Freq: Once | RESPIRATORY_TRACT | Status: AC
  Administered 2017-12-12: 5 mg via RESPIRATORY_TRACT
  Filled 2017-12-12: qty 6

## 2017-12-12 MED ORDER — IPRATROPIUM BROMIDE 0.02 % IN SOLN
0.5000 mg | Freq: Once | RESPIRATORY_TRACT | Status: AC
  Administered 2017-12-12: 0.5 mg via RESPIRATORY_TRACT
  Filled 2017-12-12: qty 2.5

## 2017-12-12 NOTE — Discharge Instructions (Signed)
Please read and follow all provided instructions.  Your diagnoses today include:  1. Cough    Tests performed today include:  Chest x-ray - does not show any pneumonia  Blood counts and electrolytes - elevated blood sugar  Blood test for heart muscle damage - normal  EKG  Vital signs. See below for your results today.   Medications prescribed:   Prednisone - steroid medicine   It is best to take this medication in the morning to prevent sleeping problems. If you are diabetic, monitor your blood sugar closely and stop taking Prednisone if blood sugar is over 300. Take with food to prevent stomach upset.    Tessalon Perles - cough suppressant medication  Take any prescribed medications only as directed.  Home care instructions:  Follow any educational materials contained in this packet.  Follow-up instructions: Please follow-up with your primary care provider in the next 3 days for further evaluation of your symptoms and a recheck if you are not feeling better.   Return instructions:   Please return to the Emergency Department if you experience worsening symptoms.  Please return with worsening wheezing, shortness of breath, or difficulty breathing.  Return with persistent fever above 101F.   Please return if you have any other emergent concerns.  Additional Information:  Your vital signs today were: BP (!) 130/57    Pulse 76    Temp 97.6 F (36.4 C) (Oral)    Resp 19    Ht 5\' 5"  (1.651 m)    Wt 95.3 kg (210 lb)    SpO2 95%    BMI 34.95 kg/m  If your blood pressure (BP) was elevated above 135/85 this visit, please have this repeated by your doctor within one month. --------------

## 2017-12-12 NOTE — ED Triage Notes (Signed)
Reports having a cough for a couple of days.  Now c./o chest pressure and reports that "it feels like my throat is closing in".  Using inhalers, tussin, and delsym with no relief from cough.

## 2017-12-12 NOTE — ED Provider Notes (Signed)
[facility] EMERGENCY DEPARTMENT Provider Note   CSN: [id number] Arrival date & time: 12/12/17  0340     History   Chief Complaint Chief Complaint  Patient presents with  . Chest Pain  . Cough    HPI Carolyn Norton is a 52 y.o. female.  Patient with history of asthma, seasonal allergies, diabetes on insulin --presents with 2 days of cough and nasal congestion, chest tightness which she attributes to asthma, occasional wheezing.  She denies any fevers, ear pain, sore throat but states she feels like her throat is dry and is constantly drinking water.  Chest pain described as a constant tightness, similar to asthma.  No nausea, vomiting, or diarrhea.  No urinary symptoms or skin rashes.  She has been using Testim at home as well as her albuterol inhaler.  This morning it felt like her throat was closing despite using her breathing medicine, prompting emergency department visit.  She denies any lower extremity swelling, diaphoresis. The onset of this condition was acute. Aggravating factors: none. Alleviating factors: none.       Past Medical History:  Diagnosis Date  . Asthma   . Diabetes mellitus without complication (HCC)   . Hypertension   . Seasonal allergies     There are no active problems to display for this patient.   Past Surgical History:  Procedure Laterality Date  . BREAST SURGERY     right breast     OB History    Gravida  0   Para  0   Term  0   Preterm  0   AB  0   Living  0     SAB  0   TAB  0   Ectopic  0   Multiple  0   Live Births  0            Home Medications    Prior to Admission medications   Medication Sig Start Date End Date Taking? Authorizing Provider  acetaminophen (TYLENOL) 500 MG tablet Take 1 tablet (500 mg total) by mouth every 6 (six) hours as needed. Patient not taking: Reported on 01/26/2017 01/12/16   Marlon Pel, PA-C  albuterol (PROVENTIL HFA;VENTOLIN HFA) 108 (90 Base) MCG/ACT  inhaler Inhale 2 puffs into the lungs every 4 (four) hours as needed for wheezing or shortness of breath. 10/21/16   Muthersbaugh, Dahlia Client, PA-C  albuterol (PROVENTIL) (2.5 MG/3ML) 0.083% nebulizer solution Take 3 mLs (2.5 mg total) by nebulization every 6 (six) hours as needed for wheezing or shortness of breath. 10/21/16   Muthersbaugh, Dahlia Client, PA-C  insulin glargine (LANTUS) 100 UNIT/ML injection Inject 50 Units into the skin at bedtime.    [provider]  insulin lispro (HUMALOG) 100 UNIT/ML injection Inject 4-20 Units into the skin 3 (three) times daily with meals. On a sliding scale    [provider]  LISINOPRIL PO Take 1 tablet by mouth daily.    [provider]  methocarbamol (ROBAXIN) 500 MG tablet Take 1 tablet (500 mg total) by mouth 2 (two) times daily. 10/21/16   Muthersbaugh, Dahlia Client, PA-C  montelukast (SINGULAIR) 10 MG tablet Take 1 tablet (10 mg total) by mouth at bedtime. 10/23/14   Mirian Mo, MD  PRAVASTATIN SODIUM PO Take 1 tablet by mouth daily.    [provider]  predniSONE (DELTASONE) 20 MG tablet Take 2 tablets (40 mg total) by mouth daily. Patient not taking: Reported on 01/26/2017 10/21/16   Muthersbaugh, Dahlia Client, PA-C  venlafaxine (  EFFEXOR) 75 MG tablet Take 75 mg by mouth 3 (three) times daily with meals.    [provider]    Family History Family History  Problem Relation Age of Onset  . Hypertension Mother   . Cancer Father        prostate  . Breast cancer Maternal Grandmother     Social History Social History   Tobacco Use  . Smoking status: Current Every Day Smoker    Packs/day: 0.50    Types: Cigarettes  . Smokeless tobacco: Never Used  Substance Use Topics  . Alcohol use: No  . Drug use: No     Allergies   Erythromycin   Review of Systems Review of Systems  Constitutional: Negative for chills, diaphoresis, fatigue and fever.  HENT: Positive for congestion and sore throat (dry). Negative for ear  pain, rhinorrhea and sinus pressure.   Eyes: Negative for redness.  Respiratory: Positive for cough, chest tightness and wheezing. Negative for shortness of breath.   Cardiovascular: Negative for chest pain, palpitations and leg swelling.  Gastrointestinal: Negative for abdominal pain, diarrhea, nausea and vomiting.  Genitourinary: Negative for dysuria.  Musculoskeletal: Negative for back pain, myalgias, neck pain and neck stiffness.  Skin: Negative for rash.  Neurological: Negative for syncope, light-headedness and headaches.  Hematological: Negative for adenopathy.  Psychiatric/Behavioral: The patient is not nervous/anxious.      Physical Exam Updated Vital Signs BP 134/72 (BP Location: Left Arm)   Pulse 82   Temp 97.6 F (36.4 C) (Oral)   Resp 20   Ht 5\' 5"  (1.651 m)   Wt 95.3 kg (210 lb)   SpO2 100%   BMI 34.95 kg/m   Physical Exam  Constitutional: She appears well-developed and well-nourished.  HENT:  Head: Normocephalic and atraumatic.  Right Ear: Tympanic membrane, external ear and ear canal normal.  Left Ear: Tympanic membrane, external ear and ear canal normal.  Nose: Mucosal edema present. No rhinorrhea.  Mouth/Throat: Uvula is midline. Mucous membranes are dry. Posterior oropharyngeal erythema present. No oropharyngeal exudate, posterior oropharyngeal edema or tonsillar abscesses.  Eyes: Conjunctivae are normal. Right eye exhibits no discharge. Left eye exhibits no discharge.  Neck: Normal range of motion. Neck supple.  Cardiovascular: Normal rate, regular rhythm, normal heart sounds and normal pulses.  Pulmonary/Chest: Effort normal and breath sounds normal. She has no wheezes. She has no rhonchi. She has no rales.  Abdominal: Soft. There is no tenderness.  Neurological: She is alert.  Skin: Skin is warm and dry.  Psychiatric: She has a normal mood and affect.  Nursing note and vitals reviewed.    ED Treatments / Results  Labs (all labs ordered are listed,  but only abnormal results are displayed) Labs Reviewed  BASIC METABOLIC PANEL - Abnormal; Notable for the following components:      Result Value   Chloride 99 (*)    Glucose, Bld 266 (*)    BUN <5 (*)    Creatinine, Ser 1.02 (*)    Calcium 8.7 (*)    All other components within normal limits  CBC - Abnormal; Notable for the following components:   Hemoglobin 11.8 (*)    MCV 77.4 (*)    MCH 24.4 (*)    RDW 16.9 (*)    Platelets 411 (*)    All other components within normal limits  I-STAT TROPONIN, ED  I-STAT BETA HCG BLOOD, ED ([facility], [facility], [facility] ONLY)    ED ECG REPORT   Date: 12/12/2017  Rate:  80  Rhythm: normal sinus rhythm  QRS Axis: normal  Intervals: normal  ST/T Wave abnormalities: nonspecific T wave changes  Conduction Disutrbances:none  Narrative Interpretation:   Old EKG Reviewed: yes, 07/2015 -- now slower, improvement in lateral t-wave changes today  I have personally reviewed the EKG tracing and agree with the computerized printout as noted.  Radiology Dg Chest 2 View  Result Date: 12/12/2017 CLINICAL DATA:  Cough for a couple of days. Now with chest pressure. EXAM: CHEST - 2 VIEW COMPARISON:  10/20/2016 FINDINGS: Normal heart size and pulmonary vascularity. No focal airspace disease or consolidation in the lungs. No blunting of costophrenic angles. No pneumothorax. Mediastinal contours appear intact. Old right rib fractures. No change since prior study. IMPRESSION: No active cardiopulmonary disease. Electronically Signed   By: Burman Nieves M.D.   On: 12/12/2017 05:07    Procedures Procedures (including critical care time)  Medications Ordered in ED Medications  predniSONE (DELTASONE) tablet 40 mg (40 mg Oral Given 12/12/17 0614)  albuterol (PROVENTIL) (2.5 MG/3ML) 0.083% nebulizer solution 5 mg (5 mg Nebulization Given 12/12/17 0613)  ipratropium (ATROVENT) nebulizer solution 0.5 mg (0.5 mg Nebulization Given 12/12/17 4098)     Initial Impression / Assessment and  Plan / ED Course  I have reviewed the triage vital signs and the nursing notes.  Pertinent labs & imaging results that were available during my care of the patient were reviewed by me and considered in my medical decision making (see chart for details).     Patient seen and examined.  EKG reviewed.  Lab work is reassuring.  Chest tightness has been constant over the past 2 days.  Low concern for ACS at this time.  Initial impression is bronchitis.  Patient may benefit from prednisone.  She does not check her sugars regularly but has the ability to do so at home and I discussed the importance of doing this to her on prednisone.  We will give a breathing treatment as she continues to have some chest tightness.  Vital signs reviewed and are as follows: BP 134/72 (BP Location: Left Arm)   Pulse 82   Temp 97.6 F (36.4 C) (Oral)   Resp 20   Ht 5\' 5"  (1.651 m)   Wt 95.3 kg (210 lb)   SpO2 100%   BMI 34.95 kg/m    7:27 AM patient reexamined.  She states "I am better".  Lungs remain clear.  She has no respiratory distress.  Normal vital signs.  Denies any chest pain at current time.  She is ready for discharged home.  Will discharge with prednisone and Tessalon.  Again reiterated need to closely monitor blood sugars.  Encourage PCP follow-up in the next 3 days for recheck.  Encouraged return to emergency department with worsening or persistent chest pain, worsening difficulty breathing or shortness of breath, fevers, new symptoms or other concerns.  Patient verbalizes understanding and agrees with plan.  Final Clinical Impressions(s) / ED Diagnoses   Final diagnoses:  Cough   Patient with cough, possible bronchitis/asthma exacerbation. No concern for PNA given normal lung exam/x-ray. Antibiotics not indicated. Feels better after breathing treatment here. Vitals reassuring. Tightness suggestive of asthma, not ACS. Low concern for PE given this presentation. Troponin neg, EKG non-ischemic.  Conservative therapy indicated.     ED Discharge Orders        Ordered    predniSONE (DELTASONE) 20 MG tablet  Daily     12/12/17 0724    benzonatate (TESSALON) 100  MG capsule  Every 8 hours     12/12/17 0724       Renne CriglerGeiple, Jette Lewan, PA-C 12/12/17 0730    Ward, Layla MawKristen N, DO 12/12/17 [phone]

## 2018-07-07 ENCOUNTER — Telehealth (HOSPITAL_COMMUNITY): Payer: Self-pay | Admitting: *Deleted

## 2018-07-07 NOTE — Telephone Encounter (Signed)
Telephoned patient, left a message to return a call to [facility]. 

## 2018-07-29 ENCOUNTER — Other Ambulatory Visit (HOSPITAL_COMMUNITY): Payer: Self-pay | Admitting: *Deleted

## 2018-07-29 DIAGNOSIS — Z1231 Encounter for screening mammogram for malignant neoplasm of breast: Secondary | ICD-10-CM

## 2018-10-21 ENCOUNTER — Telehealth: Payer: Self-pay | Admitting: *Deleted

## 2018-10-21 NOTE — Telephone Encounter (Signed)
Telephoned patient, left a message to return a call to [facility] with concerns about her [facility] appointment scheduled 10-27-2018 at 1pm.

## 2018-10-27 ENCOUNTER — Ambulatory Visit
Admission: RE | Admit: 2018-10-27 | Discharge: 2018-10-27 | Disposition: A | Discharge status: Home / Self Care | Payer: No Typology Code available for payment source | Source: Ambulatory Visit | Attending: Obstetrics and Gynecology | Admitting: Obstetrics and Gynecology

## 2018-10-27 ENCOUNTER — Encounter (HOSPITAL_COMMUNITY): Payer: Self-pay

## 2018-10-27 ENCOUNTER — Ambulatory Visit (HOSPITAL_COMMUNITY)
Admission: RE | Admit: 2018-10-27 | Discharge: 2018-10-27 | Disposition: A | Discharge status: Home / Self Care | Payer: Self-pay | Source: Ambulatory Visit | Attending: Obstetrics and Gynecology | Admitting: Obstetrics and Gynecology

## 2018-10-27 VITALS — BP 152/84 | Wt 191.0 lb

## 2018-10-27 DIAGNOSIS — Z1231 Encounter for screening mammogram for malignant neoplasm of breast: Secondary | ICD-10-CM

## 2018-10-27 DIAGNOSIS — Z01419 Encounter for gynecological examination (general) (routine) without abnormal findings: Secondary | ICD-10-CM

## 2018-10-27 HISTORY — DX: Anxiety disorder, unspecified: F41.9

## 2018-10-27 HISTORY — DX: Hyperlipidemia, unspecified: E78.5

## 2018-10-27 HISTORY — DX: Major depressive disorder, single episode, unspecified: F32.9

## 2018-10-27 HISTORY — DX: Depression, unspecified: F32.A

## 2018-10-27 NOTE — Progress Notes (Signed)
No complaints today.   Pap Smear: Pap smear completed today. Last Pap smear was 02/23/2017 at [facility] and normal with negative HPV. Per patient has a history of an abnormal Pap smear about 12 years ago that a colposcopy was completed for follow up. Patient has only had two normal Pap smears since colposcopy. Last two Pap smear results are in EPIC.  Physical exam: Breasts Breasts symmetrical. No skin abnormalities bilateral breasts. No nipple retraction bilateral breasts. No nipple discharge bilateral breasts. No lymphadenopathy. No lumps palpated bilateral breasts. No complaints of pain or tenderness on exam. Referred patient to the [facility] for a screening mammogram. Appointment scheduled for Thursday, October 27, 2018 at 1440.        Pelvic/Bimanual   Ext Genitalia No lesions, no swelling and no discharge observed on external genitalia.         Vagina Vagina pink and normal texture. No lesions or discharge observed in vagina.          Cervix Cervix is present. Cervix pink and of normal texture. No discharge observed.     Uterus Uterus is present and palpable. Uterus in normal position and normal size.        Adnexae Bilateral ovaries present and palpable. No tenderness on palpation.         Rectovaginal No rectal exam completed today since patient had no rectal complaints. No skin abnormalities observed on exam.    Smoking History: Patient is a current smoker. Discussed smoking cessation with patient. Referred to the [facility] Quitline and gave resources to free smoking cessation classes at [facility].  Patient Navigation: Patient education provided. Access to services provided for patient through [facility] program.   Colorectal Cancer Screening: Per patient has never had a colonoscopy completed. No complaints today. FIT Test given to patient to complete and return to [facility].  Breast and Cervical Cancer Risk Assessment: Patient has a family history of her paternal  grandmother having breast cancer. Patient has no known genetic mutations or history of radiation treatment to the chest before age 55. Per patient has a history of cervical dysplasia. Patient has no history of being immunocompromised or DES exposure in-utero.  Risk Assessment    Risk Scores      10/27/2018   Last edited by: Lynnell Dike, LPN   5-year risk: 1 %   Lifetime risk: 8.9 %

## 2018-10-27 NOTE — Patient Instructions (Signed)
Explained breast self awareness with Lucilla Lame. Let her know [facility] will cover Pap smears and HPV typing every 5 years unless has a history of abnormal Pap smears. Referred patient to the [facility] of [facility] for a screening mammogram. Appointment scheduled for Thursday, October 27, 2018 at 1440. Patient aware of appointment and will be there. Let patient know will follow up with her within the next couple weeks with results of Pap smear by letter or phone. Informed patient that the Breast Center will follow-up with her within the next couple of weeks with results of mammogram by letter or phone. Lucilla Lame verbalized understanding.  Ronald Vinsant, Kathaleen Maser, RN 2:22 PM

## 2018-10-28 ENCOUNTER — Encounter (HOSPITAL_COMMUNITY): Payer: Self-pay | Admitting: *Deleted

## 2018-10-31 ENCOUNTER — Other Ambulatory Visit: Payer: Self-pay

## 2018-11-01 LAB — CYTOLOGY - PAP
DIAGNOSIS: NEGATIVE
HPV: NOT DETECTED

## 2018-12-01 LAB — FECAL OCCULT BLOOD, IMMUNOCHEMICAL

## 2019-01-16 ENCOUNTER — Encounter (HOSPITAL_COMMUNITY): Payer: Self-pay | Admitting: *Deleted

## 2019-01-16 NOTE — Progress Notes (Signed)
Letter mailed to patient 11/01/2018 with negative pap smear results. HPV was negative. Next pap smear due in five years.  

## 2019-05-22 ENCOUNTER — Ambulatory Visit (HOSPITAL_COMMUNITY)
Admission: EM | Admit: 2019-05-22 | Discharge: 2019-05-22 | Disposition: A | Discharge status: Home / Self Care | Payer: Self-pay | Attending: Family Medicine | Admitting: Family Medicine

## 2019-05-22 ENCOUNTER — Other Ambulatory Visit: Payer: Self-pay

## 2019-05-22 ENCOUNTER — Encounter (HOSPITAL_COMMUNITY): Payer: Self-pay

## 2019-05-22 DIAGNOSIS — L02419 Cutaneous abscess of limb, unspecified: Secondary | ICD-10-CM

## 2019-05-22 DIAGNOSIS — L03119 Cellulitis of unspecified part of limb: Secondary | ICD-10-CM

## 2019-05-22 MED ORDER — CEFTRIAXONE SODIUM 1 G IJ SOLR
INTRAMUSCULAR | Status: AC
  Filled 2019-05-22: qty 10

## 2019-05-22 MED ORDER — CEFTRIAXONE SODIUM 1 G IJ SOLR
1.0000 g | Freq: Once | INTRAMUSCULAR | Status: AC
  Administered 2019-05-22: 1 g via INTRAMUSCULAR

## 2019-05-22 MED ORDER — KETOROLAC TROMETHAMINE 60 MG/2ML IM SOLN
60.0000 mg | Freq: Once | INTRAMUSCULAR | Status: AC
  Administered 2019-05-22: 15:00:00 60 mg via INTRAMUSCULAR

## 2019-05-22 MED ORDER — KETOROLAC TROMETHAMINE 60 MG/2ML IM SOLN
INTRAMUSCULAR | Status: AC
  Filled 2019-05-22: qty 2

## 2019-05-22 MED ORDER — DOXYCYCLINE HYCLATE 100 MG PO CAPS: 100.0000 mg | ORAL_CAPSULE | Freq: Two times a day (BID) | ORAL | 0 refills | Status: DC

## 2019-05-22 NOTE — ED Triage Notes (Signed)
Patient presents to Urgent Care with complaints of abscess on the top of her right leg since 5 days ago. Patient reports the area is not open or draining.

## 2019-05-22 NOTE — ED Provider Notes (Signed)
[place]    CSN: [id number] Arrival date & time: 05/22/19  1331      History   Chief Complaint Chief Complaint  Patient presents with  . Abscess    HPI Carolyn Norton is a 53 y.o. female.   Carolyn Norton presents with complaints of wound with redness to right proximal lateral thigh which she noted approximately 5 days ago. No obvious drainage. Has been applying epsom salt to the area. It is painful. Denies any previous similar. She is diabetic. No known MRSA history. History of anxiety, asthma, depression, DM, htn.     ROS per HPI, negative if not otherwise mentioned.      Past Medical History:  Diagnosis Date  . Anxiety   . Asthma   . Depression   . Diabetes mellitus without complication ([place])   . Hyperlipidemia   . Hypertension   . Seasonal allergies     There are no active problems to display for this patient.   Past Surgical History:  Procedure Laterality Date  . BREAST EXCISIONAL BIOPSY Right   . BREAST SURGERY     right breast    OB History    Gravida  0   Para  0   Term  0   Preterm  0   AB  0   Living  0     SAB  0   TAB  0   Ectopic  0   Multiple  0   Live Births  0            Home Medications    Prior to Admission medications   Medication Sig Start Date End Date Taking? Authorizing Provider  LISINOPRIL PO Take 1 tablet by mouth daily.   Yes [provider]  albuterol (PROVENTIL HFA;VENTOLIN HFA) 108 (90 Base) MCG/ACT inhaler Inhale 2 puffs into the lungs every 4 (four) hours as needed for wheezing or shortness of breath. 10/21/16   Muthersbaugh, Jarrett Soho, PA-C  albuterol (PROVENTIL) (2.5 MG/3ML) 0.083% nebulizer solution Take 3 mLs (2.5 mg total) by nebulization every 6 (six) hours as needed for wheezing or shortness of breath. 10/21/16   Muthersbaugh, Jarrett Soho, PA-C  benzonatate (TESSALON) 100 MG capsule Take 1 capsule (100 mg total) by mouth every 8 (eight) hours. Patient not taking:  Reported on 10/27/2018 12/12/17   Carlisle Cater, PA-C  doxycycline (VIBRAMYCIN) 100 MG capsule Take 1 capsule (100 mg total) by mouth 2 (two) times daily. 05/22/19   Augusto Gamble B, NP  insulin glargine (LANTUS) 100 UNIT/ML injection Inject 50 Units into the skin at bedtime.    [provider]  insulin lispro (HUMALOG) 100 UNIT/ML injection Inject 4-20 Units into the skin 3 (three) times daily with meals. On a sliding scale    [provider]  methocarbamol (ROBAXIN) 500 MG tablet Take 1 tablet (500 mg total) by mouth 2 (two) times daily. 10/21/16   Muthersbaugh, Jarrett Soho, PA-C  montelukast (SINGULAIR) 10 MG tablet Take 1 tablet (10 mg total) by mouth at bedtime. 10/23/14   Debby Freiberg, MD  PRAVASTATIN SODIUM PO Take 1 tablet by mouth daily.    [provider]  predniSONE (DELTASONE) 20 MG tablet Take 2 tablets (40 mg total) by mouth daily. Patient not taking: Reported on 10/27/2018 12/12/17   Carlisle Cater, PA-C  venlafaxine (EFFEXOR) 75 MG tablet Take 75 mg by mouth 3 (three) times daily with meals.    [provider]    Family History Family History  Problem Relation Age of Onset  . Hypertension Mother   . Cancer Mother   . Cancer Father        prostate  . Diabetes Father   . Breast cancer Paternal Grandmother     Social History Social History   Tobacco Use  . Smoking status: Current Every Day Smoker    Packs/day: 0.50    Types: Cigarettes  . Smokeless tobacco: Never Used  Substance Use Topics  . Alcohol use: No  . Drug use: No     Allergies   Erythromycin   Review of Systems Review of Systems   Physical Exam Triage Vital Signs ED Triage Vitals  Enc Vitals Group     BP 05/22/19 1408 (!) 142/87     Pulse Rate 05/22/19 1408 86     Resp 05/22/19 1408 16     Temp 05/22/19 1408 99.6 F (37.6 C)     Temp Source 05/22/19 1408 Temporal     SpO2 05/22/19 1408 100 %     Weight --      Height --      Head Circumference --      Peak  Flow --      Pain Score 05/22/19 1405 10     Pain Loc --      Pain Edu? --      Excl. in GC? --    No data found.  Updated Vital Signs BP (!) 142/87 (BP Location: Right Arm)   Pulse 86   Temp 99.6 F (37.6 C) (Temporal)   Resp 16   LMP 01/26/2017 (Approximate)   SpO2 100%    Physical Exam Constitutional:      General: She is not in acute distress.    Appearance: She is well-developed.  Cardiovascular:     Rate and Rhythm: Normal rate.  Pulmonary:     Effort: Pulmonary effort is normal.  Skin:    General: Skin is warm and dry.          Comments: Large cellulitic region of proximal anterior thigh with centralized circular wound which is open with clear fluid, no underlying fluctuance- it is firm to touch, tender; see photo   Neurological:     Mental Status: She is alert and oriented to person, place, and time.        UC Treatments / Results  Labs (all labs ordered are listed, but only abnormal results are displayed) Labs Reviewed - No data to display  EKG   Radiology No results found.  Procedures Procedures (including critical care time)  Medications Ordered in UC Medications  ketorolac (TORADOL) injection 60 mg (60 mg Intramuscular Given 05/22/19 1458)  cefTRIAXone (ROCEPHIN) injection 1 g (1 g Intramuscular Given 05/22/19 1513)  ketorolac (TORADOL) 60 MG/2ML injection (has no administration in time range)  cefTRIAXone (ROCEPHIN) 1 g injection (has no administration in time range)    Initial Impression / Assessment and Plan / UC Course  I have reviewed the triage vital signs and the nursing notes.  Pertinent labs & imaging results that were available during my care of the patient were reviewed by me and considered in my medical decision making (see chart for details).     Patient voices that she will not be able to pick up her prescriptions until tomorrow due to cost, rocephin given here in clinic. No indication of I&D at this time. Strict return  precautions discussed. Encouraged to make follow up appointment with PCP next week for recheck. Patient  verbalized understanding and agreeable to plan.   Final Clinical Impressions(s) / UC Diagnoses   Final diagnoses:  Cellulitis and abscess of leg, except foot     Discharge Instructions     Warm compresses 3-4 times a day to promote drainage.  Tylenol and/or ibuprofen as needed for pain or fevers.   Complete course of antibiotics.  Please follow up with your PCP for recheck next week.  If any worsening of symptoms- increased pain, fevers or worsening wound- please return to be seen.  Continue to monitor blood sugar as elevation in blood sugar can make healing difficult.    ED Prescriptions    Medication Sig Dispense Auth. Provider   doxycycline (VIBRAMYCIN) 100 MG capsule Take 1 capsule (100 mg total) by mouth 2 (two) times daily. 20 capsule Georgetta HaberBurky, Natalie B, NP     Controlled Substance Prescriptions [place] Controlled Substance Registry consulted? Not Applicable   Georgetta HaberBurky, Natalie B, NP 05/22/19 1531

## 2019-05-22 NOTE — Discharge Instructions (Signed)
Warm compresses 3-4 times a day to promote drainage.  Tylenol and/or ibuprofen as needed for pain or fevers.   Complete course of antibiotics.  Please follow up with your PCP for recheck next week.  If any worsening of symptoms- increased pain, fevers or worsening wound- please return to be seen.  Continue to monitor blood sugar as elevation in blood sugar can make healing difficult.

## 2019-07-26 ENCOUNTER — Encounter (HOSPITAL_BASED_OUTPATIENT_CLINIC_OR_DEPARTMENT_OTHER): Payer: No Typology Code available for payment source | Admitting: Physician Assistant

## 2019-07-26 ENCOUNTER — Other Ambulatory Visit: Payer: Self-pay

## 2019-07-26 DIAGNOSIS — I10 Essential (primary) hypertension: Secondary | ICD-10-CM | POA: Insufficient documentation

## 2019-07-26 DIAGNOSIS — L03115 Cellulitis of right lower limb: Secondary | ICD-10-CM | POA: Insufficient documentation

## 2019-07-26 DIAGNOSIS — X088XXA Exposure to other specified smoke, fire and flames, initial encounter: Secondary | ICD-10-CM | POA: Insufficient documentation

## 2019-07-26 DIAGNOSIS — T2121XA Burn of second degree of chest wall, initial encounter: Secondary | ICD-10-CM | POA: Insufficient documentation

## 2019-07-26 DIAGNOSIS — E10622 Type 1 diabetes mellitus with other skin ulcer: Secondary | ICD-10-CM | POA: Insufficient documentation

## 2019-07-26 DIAGNOSIS — L97812 Non-pressure chronic ulcer of other part of right lower leg with fat layer exposed: Secondary | ICD-10-CM | POA: Insufficient documentation

## 2019-08-02 ENCOUNTER — Encounter (HOSPITAL_BASED_OUTPATIENT_CLINIC_OR_DEPARTMENT_OTHER): Payer: No Typology Code available for payment source | Attending: Physician Assistant | Admitting: Physician Assistant

## 2019-08-02 ENCOUNTER — Other Ambulatory Visit: Payer: Self-pay
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
## 2019-08-09 ENCOUNTER — Encounter (HOSPITAL_BASED_OUTPATIENT_CLINIC_OR_DEPARTMENT_OTHER): Payer: No Typology Code available for payment source | Attending: Physician Assistant | Admitting: Physician Assistant

## 2019-08-09 ENCOUNTER — Other Ambulatory Visit: Payer: Self-pay

## 2019-08-09 DIAGNOSIS — F17200 Nicotine dependence, unspecified, uncomplicated: Secondary | ICD-10-CM | POA: Insufficient documentation

## 2019-08-09 DIAGNOSIS — L97112 Non-pressure chronic ulcer of right thigh with fat layer exposed: Secondary | ICD-10-CM | POA: Insufficient documentation

## 2019-08-09 DIAGNOSIS — E10622 Type 1 diabetes mellitus with other skin ulcer: Secondary | ICD-10-CM | POA: Insufficient documentation

## 2019-08-09 DIAGNOSIS — I10 Essential (primary) hypertension: Secondary | ICD-10-CM | POA: Insufficient documentation

## 2019-08-09 DIAGNOSIS — L98492 Non-pressure chronic ulcer of skin of other sites with fat layer exposed: Secondary | ICD-10-CM | POA: Insufficient documentation

## 2019-08-09 DIAGNOSIS — Z794 Long term (current) use of insulin: Secondary | ICD-10-CM | POA: Insufficient documentation
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
## 2019-08-15 NOTE — Progress Notes (Signed)
VICIE, CECH ([id number]) Visit Report for 07/26/2019 Abuse/Suicide Risk Screen Details Patient Name: Date of Service: Carolyn Norton, Carolyn Norton 07/26/2019 9:00 AM Medical Record [id number] Patient Account Number: [id number] Date of Birth/Sex: Treating RN: September 07, 1966 (53 y.o. [name] Primary Care Kymia Simi: Elbert Ewings Other Clinician: Referring Tarell Schollmeyer: Treating Camauri Craton/Extender:Stone III, Tressia Danas, ANTHONY Weeks in Treatment: 0 Abuse/Suicide Risk Screen Items Answer ABUSE RISK SCREEN: Has anyone close to you tried to hurt or harm you recentlyo No Do you feel uncomfortable with anyone in your familyo No Has anyone forced you do things that you didnt want to doo No Electronic Signature(s) Signed: 08/15/2019 2:50:47 PM By: Carlene Coria RN Entered By: Carlene Coria on 07/26/2019 10:02:14 -------------------------------------------------------------------------------- Activities of Daily Living Details Patient Name: Date of Service: Carolyn Norton, Carolyn Norton 07/26/2019 9:00 AM Medical Record [id number] Patient Account Number: [id number] Date of Birth/Sex: Treating RN: [birth year] (53 y.o. [name] Primary Care Tex Conroy: Elbert Ewings Other Clinician: Referring Anup Brigham: Treating Pelham Hennick/Extender:Stone III, Tressia Danas, ANTHONY Weeks in Treatment: 0 Activities of Daily Living Items Answer Activities of Daily Living (Please select one for each item) Drive Automobile Completely Able Take Medications Completely Able Use Telephone Completely Able Care for Appearance Completely Able Use Toilet Completely Able Bath / Shower Completely Able Dress Self Completely Able Feed Self Completely Able Walk Completely Able Get In / Out Bed Completely Able Housework Completely Able Prepare Meals Completely Able Handle Money Completely Able Shop for Self Completely Able Electronic Signature(s) Signed: 08/15/2019 2:50:47 PM By: Carlene Coria RN Entered By:  Carlene Coria on 07/26/2019 10:02:38 -------------------------------------------------------------------------------- Education Screening Details Patient Name: Date of Service: Carolyn Norton 07/26/2019 9:00 AM Medical Record [id number] Patient Account Number: [id number] Date of Birth/Sex: Treating RN: 19-Oct-1965 (53 y.o. [name] Primary Care Samayra Hebel: Elbert Ewings Other Clinician: Referring Herberta Pickron: Treating Baron Parmelee/Extender:Stone III, Tressia Danas, ANTHONY Weeks in Treatment: 0 Primary Learner Assessed: Patient Learning Preferences/Education Level/Primary Language Learning Preference: Explanation Highest Education Level: High School Preferred Language: English Cognitive Barrier Language Barrier: No Translator Needed: No Memory Deficit: No Emotional Barrier: No Cultural/Religious Beliefs Affecting Medical Care: No Physical Barrier Impaired Vision: No Impaired Hearing: No Decreased Hand dexterity: No Knowledge/Comprehension Knowledge Level: Medium Comprehension Level: High Ability to understand written High instructions: Ability to understand verbal High instructions: Motivation Anxiety Level: Calm Cooperation: Cooperative Education Importance: Acknowledges Need Interest in Health Problems: Asks Questions Perception: Coherent Willingness to Engage in Self- High Management Activities: Readiness to Engage in Self- High Management Activities: Electronic Signature(s) Signed: 08/15/2019 2:50:47 PM By: Carlene Coria RN Entered By: Carlene Coria on 07/26/2019 10:03:13 -------------------------------------------------------------------------------- Fall Risk Assessment Details Patient Name: Date of Service: Carolyn Norton 07/26/2019 9:00 AM Medical Record [id number] Patient Account Number: [id number] Date of Birth/Sex: Treating RN: [birth year] (53 y.o. [name] Primary Care Bereket Gernert: Elbert Ewings Other  Clinician: Referring Zyair Rhein: Treating Dorianne Perret/Extender:Stone III, Tressia Danas, ANTHONY Weeks in Treatment: 0 Fall Risk Assessment Items Have you had 2 or more falls in the last 12 monthso 0 No Have you had any fall that resulted in injury in the last 12 monthso 0 No FALLS RISK SCREEN History of falling - immediate or within 3 months 0 No Secondary diagnosis (Do you have 2 or more medical diagnoseso) 0 No Ambulatory aid None/bed rest/wheelchair/nurse 0 No Crutches/cane/walker 0 No Furniture 0 No Intravenous therapy Access/Saline/Heparin Lock 0 No Weak (short steps with or without shuffle, stooped but able to lift head 0 No while walking, may seek support from furniture) Impaired (short steps with  shuffle, may have difficulty arising from chair, 0 No head down, impaired balance) Mental Status Oriented to own ability 0 No Overestimates or forgets limitations 0 No Risk Level: Low Risk Score: 0 Electronic Signature(s) Signed: 08/15/2019 2:50:47 PM By: Yevonne Pax RN Entered By: Yevonne Pax on 07/26/2019 10:03:21 -------------------------------------------------------------------------------- Nutrition Risk Screening Details Patient Name: Date of Service: Carolyn Norton, Carolyn Norton 07/26/2019 9:00 AM Medical Record [id number] Patient Account Number: [id number] Date of Birth/Sex: Treating RN: March 31, 1966 (53 y.o. Freddy Finner Primary Care Prabhnoor Ellenberger: [name] Other Clinician: Referring Addylynn Balin: Treating Odean Mcelwain/Extender:Stone III, Chyrl Civatte, ANTHONY Weeks in Treatment: 0 Height (in): 66 Weight (lbs): 210 Body Mass Index (BMI): 33.9 Nutrition Risk Screening Items Score Screening NUTRITION RISK SCREEN: I have an illness or condition that made me change the kind and/or 0 No amount of food I eat I eat fewer than two meals per day 0 No I eat few fruits and vegetables, or milk products 0 No I have three or more drinks of beer, liquor or wine almost every day 0 No I  have tooth or mouth problems that make it hard for me to eat 0 No I don't always have enough money to buy the food I need 0 No I eat alone most of the time 0 No I take three or more different prescribed or over-the-counter drugs a day 1 Yes 0 No Without wanting to, I have lost or gained 10 pounds in the last six months I am not always physically able to shop, cook and/or feed myself 0 No Nutrition Protocols Good Risk Protocol 0 No interventions needed Moderate Risk Protocol High Risk Proctocol Risk Level: Good Risk Score: 1 Electronic Signature(s) Signed: 08/15/2019 2:50:47 PM By: Yevonne Pax RN Entered By: Yevonne Pax on 07/26/2019 10:03:34
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
## 2019-08-16 ENCOUNTER — Encounter (HOSPITAL_BASED_OUTPATIENT_CLINIC_OR_DEPARTMENT_OTHER): Payer: No Typology Code available for payment source | Admitting: Physician Assistant

## 2019-08-23 ENCOUNTER — Other Ambulatory Visit: Payer: Self-pay

## 2019-08-23 ENCOUNTER — Encounter (HOSPITAL_BASED_OUTPATIENT_CLINIC_OR_DEPARTMENT_OTHER): Payer: No Typology Code available for payment source | Admitting: Physician Assistant
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
## 2019-11-13 ENCOUNTER — Other Ambulatory Visit: Payer: Self-pay

## 2019-11-13 DIAGNOSIS — Z1231 Encounter for screening mammogram for malignant neoplasm of breast: Secondary | ICD-10-CM

## 2019-12-19 ENCOUNTER — Ambulatory Visit: Payer: Self-pay | Admitting: Medical

## 2019-12-19 ENCOUNTER — Other Ambulatory Visit: Payer: Self-pay

## 2019-12-19 ENCOUNTER — Ambulatory Visit
Admission: RE | Admit: 2019-12-19 | Discharge: 2019-12-19 | Disposition: A | Discharge status: Home / Self Care | Payer: No Typology Code available for payment source | Source: Ambulatory Visit | Attending: Obstetrics and Gynecology | Admitting: Obstetrics and Gynecology

## 2019-12-19 VITALS — BP 126/68 | Wt 202.0 lb

## 2019-12-19 DIAGNOSIS — Z1231 Encounter for screening mammogram for malignant neoplasm of breast: Secondary | ICD-10-CM

## 2019-12-19 NOTE — Patient Instructions (Signed)
Mammogram °A mammogram is an X-ray of the breasts that is done to check for changes that are not normal. This test can screen for and find any changes that may suggest breast cancer. Mammograms are regularly done on women. A man may have a mammogram if he has a lump or swelling in his breast. This test can also help to find other changes and variations in the breast. °Tell a doctor: °· About any allergies you have. °· If you have breast implants. °· If you have had previous breast disease, biopsy, or surgery. °· If you are breastfeeding. °· If you are younger than age 25. °· If you have a family history of breast cancer. °· Whether you are pregnant or may be pregnant. °What are the risks? °Generally, this is a safe procedure. However, problems may occur, including: °· Exposure to radiation. Radiation levels are very low with this test. °· The results being misinterpreted. °· The need for further tests. °· The inability of the mammogram to detect certain cancers. °What happens before the procedure? °· Have this test done about 1-2 weeks after your period. This is usually when your breasts are the least tender. °· If you are visiting a new doctor or clinic, send any past mammogram images to your new doctor's office. °· Wash your breasts and under your arms the day of the test. °· Do not use deodorants, perfumes, lotions, or powders on the day of the test. °· Take off any jewelry from your neck. °· Wear clothes that you can change into and out of easily. °What happens during the procedure? ° °· You will undress from the waist up. You will put on a gown. °· You will stand in front of the X-ray machine. °· Each breast will be placed between two plastic or glass plates. The plates will press down on your breast for a few seconds. Try to stay as relaxed as possible. This does not cause any harm to your breasts. Any discomfort you feel will be very brief. °· X-rays will be taken from different angles of each breast. °The  procedure may vary among doctors and hospitals. °What happens after the procedure? °· The mammogram will be read by a specialist (radiologist). °· You may need to do certain parts of the test again. This depends on the quality of the images. °· Ask when your test results will be ready. Make sure you get your test results. °· You may go back to your normal activities. °Summary °· A mammogram is a low energy X-ray of the breasts that is done to check for abnormal changes. A man may have this test if he has a lump or swelling in his breast. °· Before the procedure, tell your doctor about any breast problems that you have had in the past. °· Have this test done about 1-2 weeks after your period. °· For the test, each breast will be placed between two plastic or glass plates. The plates will press down on your breast for a few seconds. °· The mammogram will be read by a specialist (radiologist). Ask when your test results will be ready. Make sure you get your test results. °This information is not intended to replace advice given to you by your health care provider. Make sure you discuss any questions you have with your health care provider. °Document Revised: 04/14/2018 Document Reviewed: 04/14/2018 °Elsevier Patient Education © 2020 [employer] ° °

## 2019-12-19 NOTE — Progress Notes (Signed)
Ms. Carolyn Norton is a 54 y.o. female who presents to [facility] clinic today with no complaints.    Pap Smear: Pap not smear completed today. Last Pap smear was 10/27/2018 at [facility] clinic and was normal. Per patient has no history of an abnormal Pap smear. Last Pap smear result is available in Epic.   Physical exam: Breasts Breasts symmetrical. No skin abnormalities bilateral breasts. No nipple retraction bilateral breasts. No nipple discharge bilateral breasts. No lymphadenopathy. No lumps palpated bilateral breasts.       Pelvic/Bimanual Pap is not indicated today    Smoking History: Patient has is a current smoker at 1/2 packs per day Referred to quit line.    Patient Navigation: Patient education provided. Access to services provided for patient through [facility]CP program.    Colorectal Cancer Screening: Per patient has never had colonoscopy completed No complaints today.    Breast and Cervical Cancer Risk Assessment: Patient has family history of breast cancer (paternal grandmother), known genetic mutations, or radiation treatment to the chest before age 7. Patient does not have history of cervical dysplasia, immunocompromised, or DES exposure in-utero.  Risk Assessment    Risk Scores      12/19/2019 10/27/2018   Last edited by: Narda Rutherford, LPN Stoney Bang H, LPN   5-year risk: 1.1 % 1 %   Lifetime risk: 8.8 % 8.9 %          A: B[facility]CP exam without pap smear  P: Referred patient to the [facility] for a screening mammogram. Appointment scheduled 12/19/19 @ 12:50pm.  Marny Lowenstein, PA-C 12/19/2019 11:10 AM

## 2019-12-20 ENCOUNTER — Other Ambulatory Visit: Payer: Self-pay | Admitting: Obstetrics and Gynecology

## 2019-12-20 DIAGNOSIS — R928 Other abnormal and inconclusive findings on diagnostic imaging of breast: Secondary | ICD-10-CM

## 2019-12-26 ENCOUNTER — Ambulatory Visit: Payer: No Typology Code available for payment source

## 2019-12-26 ENCOUNTER — Ambulatory Visit
Admission: RE | Admit: 2019-12-26 | Discharge: 2019-12-26 | Disposition: A | Discharge status: Home / Self Care | Payer: No Typology Code available for payment source | Source: Ambulatory Visit | Attending: Obstetrics and Gynecology | Admitting: Obstetrics and Gynecology

## 2019-12-26 ENCOUNTER — Other Ambulatory Visit: Payer: Self-pay

## 2019-12-26 DIAGNOSIS — R928 Other abnormal and inconclusive findings on diagnostic imaging of breast: Secondary | ICD-10-CM

## 2020-07-22 ENCOUNTER — Ambulatory Visit: Payer: No Typology Code available for payment source | Admitting: Family Medicine

## 2020-07-24 ENCOUNTER — Ambulatory Visit: Payer: Self-pay | Attending: Nurse Practitioner | Admitting: Nurse Practitioner

## 2020-07-24 ENCOUNTER — Other Ambulatory Visit: Payer: Self-pay

## 2020-07-24 ENCOUNTER — Encounter: Payer: Self-pay | Admitting: Nurse Practitioner

## 2020-07-24 VITALS — Ht 64.0 in | Wt 200.0 lb

## 2020-07-24 DIAGNOSIS — N393 Stress incontinence (female) (male): Secondary | ICD-10-CM

## 2020-07-24 DIAGNOSIS — K219 Gastro-esophageal reflux disease without esophagitis: Secondary | ICD-10-CM

## 2020-07-24 DIAGNOSIS — I10 Essential (primary) hypertension: Secondary | ICD-10-CM

## 2020-07-24 DIAGNOSIS — E1065 Type 1 diabetes mellitus with hyperglycemia: Secondary | ICD-10-CM

## 2020-07-24 DIAGNOSIS — F32A Depression, unspecified: Secondary | ICD-10-CM

## 2020-07-24 DIAGNOSIS — J3089 Other allergic rhinitis: Secondary | ICD-10-CM

## 2020-07-24 DIAGNOSIS — Z7689 Persons encountering health services in other specified circumstances: Secondary | ICD-10-CM

## 2020-07-24 DIAGNOSIS — F419 Anxiety disorder, unspecified: Secondary | ICD-10-CM

## 2020-07-24 DIAGNOSIS — E785 Hyperlipidemia, unspecified: Secondary | ICD-10-CM

## 2020-07-24 NOTE — Progress Notes (Signed)
Virtual Visit via Telephone Note Due to national recommendations of social distancing due to [place] 19, telehealth visit is felt to be most appropriate for this patient at this time.  I discussed the limitations, risks, security and privacy concerns of performing an evaluation and management service by telephone and the availability of in person appointments. I also discussed with the patient that there may be a patient responsible charge related to this service. The patient expressed understanding and agreed to proceed.    I connected with Carolyn Norton on 07/24/20  at   8:50 AM EST  EDT by telephone and verified that I am speaking with the correct person using two identifiers.   Consent I discussed the limitations, risks, security and privacy concerns of performing an evaluation and management service by telephone and the availability of in person appointments. I also discussed with the patient that there may be a patient responsible charge related to this service. The patient expressed understanding and agreed to proceed.   Location of Patient: Private Residence   Location of Provider: [place] and [place] participating in Telemedicine visit: [name] FNP-BC [place]    History of Present Illness: Telemedicine visit for: Establish Care  DM1 Diagnosed as a pre teen. She checks her blood glucose levels 2-3 times per day. Average readings 74-180s. Very labile. She is currently taking 45 units of lantus nightly. She is not taking any other insulin. She is unable to recall her last A1c. She is taking atorvastatin 20 mg daily. Also on ACE.  Essential Hypertension Taking lisinopril 10 mg twice a day. States this is how it was prescribed before in the past and how she has always taken it. Denies chest pain, shortness of breath, palpitations, lightheadedness, dizziness, headaches or BLE edema.  BP Readings from Last 3  Encounters:  12/19/19 126/68  05/22/19 (!) 142/87  10/27/18 (!) 152/84    Anxiety and Depression Taking hydroxyzine 25 mg TID prn for anxiety.   Migraines She takes depakote 500 mg BID for migraines which are well controlled.   Stress Incontinence She takes ditropan 5 mg TID which is effective in controlling her symptoms.   GERD She was taking dexilant in the past. States she can't recall if she has ever taken omeprazole or pantoprazole.    Past Medical History:  Diagnosis Date   Anxiety    Asthma    Depression    Diabetes mellitus without complication ([place])    Hyperlipidemia    Hypertension    Seasonal allergies     Past Surgical History:  Procedure Laterality Date   BREAST EXCISIONAL BIOPSY Right    BREAST SURGERY     right breast    Family History  Problem Relation Age of Onset   Hypertension Mother    Cancer Mother    Kidney cancer Mother    Cancer Father        prostate   Breast cancer Paternal Grandmother    Leukemia Brother     Social History   Socioeconomic History   Marital status: Single    Spouse name: Not on file   Number of children: 0   Years of education: Not on file   Highest education level: High school graduate  Occupational History   Not on file  Tobacco Use   Smoking status: Current Every Day Smoker    Packs/day: 0.50    Types: Cigarettes   Smokeless tobacco: Never Used  Vaping Use   Vaping Use: Never used  Substance and Sexual Activity   Alcohol use: No   Drug use: No   Sexual activity: Not Currently    Birth control/protection: None  Other Topics Concern   Not on file  Social History Narrative   Not on file   Social Determinants of Health   Financial Resource Strain:    Difficulty of Paying Living Expenses: Not on file  Food Insecurity:    Worried About [place] in the Last Year: Not on file   Ran Out of Food in the Last Year: Not on file  Transportation Needs: No  Transportation Needs   Lack of Transportation (Medical): No   Lack of Transportation (Non-Medical): No  Physical Activity:    Days of Exercise per Week: Not on file   Minutes of Exercise per Session: Not on file  Stress:    Feeling of Stress : Not on file  Social Connections:    Frequency of Communication with Friends and Family: Not on file   Frequency of Social Gatherings with Friends and Family: Not on file   Attends Religious Services: Not on file   Active Member of Clubs or Organizations: Not on file   Attends Archivist Meetings: Not on file   Marital Status: Not on file     Observations/Objective: Awake, alert and oriented x 3   Review of Systems  Constitutional: Negative for fever, malaise/fatigue and weight loss.  HENT: Negative.  Negative for nosebleeds.   Eyes: Negative.  Negative for blurred vision, double vision and photophobia.  Respiratory: Negative.  Negative for cough and shortness of breath.   Cardiovascular: Negative.  Negative for chest pain, palpitations and leg swelling.  Gastrointestinal: Positive for heartburn. Negative for nausea and vomiting.  Genitourinary:       Stress Incontinence  Musculoskeletal: Negative.  Negative for myalgias.  Neurological: Positive for headaches. Negative for dizziness, focal weakness and seizures.  Endo/Heme/Allergies: Positive for environmental allergies.  Psychiatric/Behavioral: Positive for depression. Negative for suicidal ideas. The patient is nervous/anxious.     Assessment and Plan: Carolyn Norton was seen today for new patient (initial visit).  Diagnoses and all orders for this visit:  Encounter to establish care  Type 1 diabetes mellitus with hyperglycemia (HCC) -     glucose blood (TRUE METRIX BLOOD GLUCOSE TEST) test strip; Use as instructed. Check blood glucose level by fingerstick twice per day. -     TRUEplus Lancets 28G MISC; Use as instructed. Check blood glucose level by fingerstick twice  per day. -     Blood Glucose Monitoring Suppl (TRUE METRIX METER) w/Device KIT; Use as instructed. Check blood glucose level by fingerstick twice per day.  GERD without esophagitis -     pantoprazole (PROTONIX) 40 MG tablet; Take 1 tablet (40 mg total) by mouth daily.  Anxiety and depression -     hydrOXYzine (ATARAX/VISTARIL) 25 MG tablet; Take 1 tablet (25 mg total) by mouth every 8 (eight) hours as needed for anxiety. -     escitalopram (LEXAPRO) 20 MG tablet; Take 1 tablet (20 mg total) by mouth daily.  Stress incontinence -     oxybutynin (DITROPAN) 5 MG tablet; Take 1 tablet (5 mg total) by mouth 3 (three) times daily.  Dyslipidemia, goal LDL below 70 -     atorvastatin (LIPITOR) 20 MG tablet; Take 1 tablet (20 mg total) by mouth daily.  Essential hypertension -     lisinopril (ZESTRIL) 10  MG tablet; Take 1 tablet (10 mg total) by mouth in the morning and at bedtime. Continue all antihypertensives as prescribed.  Remember to bring in your blood pressure log with you for your follow up appointment.  DASH/Mediterranean Diets are healthier choices for HTN.    Environmental and seasonal allergies -     montelukast (SINGULAIR) 10 MG tablet; Take 1 tablet (10 mg total) by mouth at bedtime.      Follow Up Instructions Return in about 2 months (around 09/23/2020).     I discussed the assessment and treatment plan with the patient. The patient was provided an opportunity to ask questions and all were answered. The patient agreed with the plan and demonstrated an understanding of the instructions.   The patient was advised to call back or seek an in-person evaluation if the symptoms worsen or if the condition fails to improve as anticipated.  I provided 21 minutes of non-face-to-face time during this encounter including median intraservice time, reviewing previous notes, labs, imaging, medications and explaining diagnosis and management.  [name], FNP-BC

## 2020-07-25 ENCOUNTER — Encounter: Payer: Self-pay | Admitting: Nurse Practitioner

## 2020-07-25 ENCOUNTER — Other Ambulatory Visit: Payer: Self-pay | Admitting: Pharmacist

## 2020-07-25 MED ORDER — OXYBUTYNIN CHLORIDE 5 MG PO TABS: 5.0000 mg | ORAL_TABLET | Freq: Three times a day (TID) | ORAL | 2 refills | Status: AC

## 2020-07-25 MED ORDER — INSULIN GLARGINE 100 UNIT/ML SOLOSTAR PEN: 45.0000 [IU] | PEN_INJECTOR | Freq: Every day | SUBCUTANEOUS | 2 refills | Status: DC

## 2020-07-25 MED ORDER — TRUE METRIX BLOOD GLUCOSE TEST VI STRP: ORAL_STRIP | 12 refills | Status: DC

## 2020-07-25 MED ORDER — TRUE METRIX METER W/DEVICE KIT: PACK | 0 refills | Status: DC

## 2020-07-25 MED ORDER — ESCITALOPRAM OXALATE 20 MG PO TABS: 20.0000 mg | ORAL_TABLET | Freq: Every day | ORAL | 1 refills | Status: DC

## 2020-07-25 MED ORDER — INSULIN GLARGINE 100 UNIT/ML SOLOSTAR PEN: 20.0000 [IU] | PEN_INJECTOR | Freq: Every day | SUBCUTANEOUS | 11 refills | Status: DC

## 2020-07-25 MED ORDER — TRUEPLUS LANCETS 28G MISC: 3 refills | Status: DC

## 2020-07-25 MED ORDER — HYDROXYZINE HCL 25 MG PO TABS: 25.0000 mg | ORAL_TABLET | Freq: Three times a day (TID) | ORAL | 2 refills | Status: DC | PRN

## 2020-07-25 MED ORDER — MONTELUKAST SODIUM 10 MG PO TABS: 10.0000 mg | ORAL_TABLET | Freq: Every day | ORAL | 1 refills | Status: DC

## 2020-07-25 MED ORDER — PANTOPRAZOLE SODIUM 40 MG PO TBEC: 40.0000 mg | DELAYED_RELEASE_TABLET | Freq: Every day | ORAL | 3 refills | Status: DC

## 2020-07-25 MED ORDER — LISINOPRIL 10 MG PO TABS: 10.0000 mg | ORAL_TABLET | Freq: Two times a day (BID) | ORAL | 1 refills | Status: DC

## 2020-07-25 MED ORDER — ATORVASTATIN CALCIUM 20 MG PO TABS: 20.0000 mg | ORAL_TABLET | Freq: Every day | ORAL | 1 refills | Status: DC

## 2020-07-25 MED FILL — !LANTUS SOLOSTAR 100UNITS/M: 100 | 26 days supply | Qty: 12 | Fill #0

## 2020-07-30 ENCOUNTER — Other Ambulatory Visit: Payer: No Typology Code available for payment source

## 2020-08-05 ENCOUNTER — Other Ambulatory Visit: Payer: No Typology Code available for payment source

## 2020-09-06 ENCOUNTER — Emergency Department (HOSPITAL_COMMUNITY): Payer: HRSA Program

## 2020-09-06 ENCOUNTER — Inpatient Hospital Stay (HOSPITAL_COMMUNITY)
Admission: EM | Admit: 2020-09-06 | Discharge: 2020-09-09 | DRG: 177 | Disposition: A | Discharge status: Home / Self Care | Payer: HRSA Program | Attending: Internal Medicine | Admitting: Internal Medicine

## 2020-09-06 ENCOUNTER — Encounter (HOSPITAL_COMMUNITY): Payer: Self-pay | Admitting: Emergency Medicine

## 2020-09-06 DIAGNOSIS — K219 Gastro-esophageal reflux disease without esophagitis: Secondary | ICD-10-CM | POA: Diagnosis present

## 2020-09-06 DIAGNOSIS — E1165 Type 2 diabetes mellitus with hyperglycemia: Secondary | ICD-10-CM

## 2020-09-06 DIAGNOSIS — J9601 Acute respiratory failure with hypoxia: Secondary | ICD-10-CM | POA: Diagnosis present

## 2020-09-06 DIAGNOSIS — F319 Bipolar disorder, unspecified: Secondary | ICD-10-CM

## 2020-09-06 DIAGNOSIS — N179 Acute kidney failure, unspecified: Secondary | ICD-10-CM | POA: Diagnosis present

## 2020-09-06 DIAGNOSIS — I10 Essential (primary) hypertension: Secondary | ICD-10-CM | POA: Diagnosis present

## 2020-09-06 DIAGNOSIS — U071 COVID-19: Principal | ICD-10-CM

## 2020-09-06 DIAGNOSIS — IMO0002 Reserved for concepts with insufficient information to code with codable children: Secondary | ICD-10-CM

## 2020-09-06 DIAGNOSIS — R4182 Altered mental status, unspecified: Secondary | ICD-10-CM

## 2020-09-06 DIAGNOSIS — F17200 Nicotine dependence, unspecified, uncomplicated: Secondary | ICD-10-CM

## 2020-09-06 DIAGNOSIS — J1282 Pneumonia due to coronavirus disease 2019: Secondary | ICD-10-CM | POA: Diagnosis present

## 2020-09-06 DIAGNOSIS — E872 Acidosis: Secondary | ICD-10-CM

## 2020-09-06 DIAGNOSIS — G9341 Metabolic encephalopathy: Secondary | ICD-10-CM | POA: Diagnosis present

## 2020-09-06 DIAGNOSIS — F17210 Nicotine dependence, cigarettes, uncomplicated: Secondary | ICD-10-CM | POA: Diagnosis present

## 2020-09-06 DIAGNOSIS — N63 Unspecified lump in unspecified breast: Secondary | ICD-10-CM

## 2020-09-06 DIAGNOSIS — E1100 Type 2 diabetes mellitus with hyperosmolarity without nonketotic hyperglycemic-hyperosmolar coma (NKHHC): Secondary | ICD-10-CM

## 2020-09-06 DIAGNOSIS — Z794 Long term (current) use of insulin: Secondary | ICD-10-CM | POA: Diagnosis not present

## 2020-09-06 DIAGNOSIS — E1111 Type 2 diabetes mellitus with ketoacidosis with coma: Secondary | ICD-10-CM | POA: Diagnosis not present

## 2020-09-06 DIAGNOSIS — E1110 Type 2 diabetes mellitus with ketoacidosis without coma: Secondary | ICD-10-CM | POA: Diagnosis present

## 2020-09-06 DIAGNOSIS — E669 Obesity, unspecified: Secondary | ICD-10-CM | POA: Diagnosis present

## 2020-09-06 DIAGNOSIS — Z79899 Other long term (current) drug therapy: Secondary | ICD-10-CM

## 2020-09-06 DIAGNOSIS — R739 Hyperglycemia, unspecified: Secondary | ICD-10-CM

## 2020-09-06 DIAGNOSIS — E875 Hyperkalemia: Secondary | ICD-10-CM

## 2020-09-06 DIAGNOSIS — Z9111 Patient's noncompliance with dietary regimen: Secondary | ICD-10-CM

## 2020-09-06 DIAGNOSIS — E1140 Type 2 diabetes mellitus with diabetic neuropathy, unspecified: Secondary | ICD-10-CM | POA: Diagnosis present

## 2020-09-06 DIAGNOSIS — E785 Hyperlipidemia, unspecified: Secondary | ICD-10-CM | POA: Diagnosis present

## 2020-09-06 DIAGNOSIS — F32A Depression, unspecified: Secondary | ICD-10-CM | POA: Diagnosis present

## 2020-09-06 LAB — BASIC METABOLIC PANEL
Anion gap: 23 — ABNORMAL HIGH (ref 5–15)
Anion gap: 24 — ABNORMAL HIGH (ref 5–15)
Anion gap: 17 — ABNORMAL HIGH (ref 5–15)
Anion gap: 15 (ref 5–15)
BUN: 58 mg/dL — ABNORMAL HIGH (ref 6–20)
BUN: 57 mg/dL — ABNORMAL HIGH (ref 6–20)
BUN: 49 mg/dL — ABNORMAL HIGH (ref 6–20)
BUN: 47 mg/dL — ABNORMAL HIGH (ref 6–20)
CO2: 23 mmol/L (ref 22–32)
CO2: 23 mmol/L (ref 22–32)
CO2: 26 mmol/L (ref 22–32)
CO2: 28 mmol/L (ref 22–32)
Calcium: 9.8 mg/dL (ref 8.9–10.3)
Calcium: 9.9 mg/dL (ref 8.9–10.3)
Calcium: 9.6 mg/dL (ref 8.9–10.3)
Calcium: 9.3 mg/dL (ref 8.9–10.3)
Chloride: 89 mmol/L — ABNORMAL LOW (ref 98–111)
Chloride: 88 mmol/L — ABNORMAL LOW (ref 98–111)
Chloride: 107 mmol/L (ref 98–111)
Chloride: 109 mmol/L (ref 98–111)
Creatinine, Ser: 2.94 mg/dL — ABNORMAL HIGH (ref 0.44–1.00)
Creatinine, Ser: 2.98 mg/dL — ABNORMAL HIGH (ref 0.44–1.00)
Creatinine, Ser: 2.13 mg/dL — ABNORMAL HIGH (ref 0.44–1.00)
Creatinine, Ser: 2 mg/dL — ABNORMAL HIGH (ref 0.44–1.00)
GFR, Estimated: 18 mL/min — ABNORMAL LOW (ref >60)
GFR, Estimated: 18 mL/min — ABNORMAL LOW (ref >60)
GFR, Estimated: 27 mL/min — ABNORMAL LOW (ref >60)
GFR, Estimated: 29 mL/min — ABNORMAL LOW (ref >60)
Glucose, Bld: 1292 mg/dL (ref 70–99)
Glucose, Bld: 1191 mg/dL (ref 70–99)
Glucose, Bld: 530 mg/dL (ref 70–99)
Glucose, Bld: 409 mg/dL — ABNORMAL HIGH (ref 70–99)
Potassium: 5.9 mmol/L — ABNORMAL HIGH (ref 3.5–5.1)
Potassium: 6.3 mmol/L (ref 3.5–5.1)
Potassium: 4 mmol/L (ref 3.5–5.1)
Potassium: 4.4 mmol/L (ref 3.5–5.1)
Sodium: 135 mmol/L (ref 135–145)
Sodium: 135 mmol/L (ref 135–145)
Sodium: 150 mmol/L — ABNORMAL HIGH (ref 135–145)
Sodium: 152 mmol/L — ABNORMAL HIGH (ref 135–145)

## 2020-09-06 LAB — CBC
HCT: 51.5 % — ABNORMAL HIGH (ref 36.0–46.0)
Hemoglobin: 15.9 g/dL — ABNORMAL HIGH (ref 12.0–15.0)
MCH: 28.6 pg (ref 26.0–34.0)
MCHC: 30.9 g/dL (ref 30.0–36.0)
MCV: 92.6 fL (ref 80.0–100.0)
Platelets: 256 10*3/uL (ref 150–400)
RBC: 5.56 MIL/uL — ABNORMAL HIGH (ref 3.87–5.11)
RDW: 14.4 % (ref 11.5–15.5)
WBC: 8.5 10*3/uL (ref 4.0–10.5)
nRBC: 0 % (ref 0.0–0.2)

## 2020-09-06 LAB — CBG MONITORING, ED
Glucose-Capillary: >600 mg/dL (ref 70–99)
Glucose-Capillary: >600 mg/dL (ref 70–99)
Glucose-Capillary: >600 mg/dL (ref 70–99)
Glucose-Capillary: >600 mg/dL (ref 70–99)
Glucose-Capillary: >600 mg/dL (ref 70–99)
Glucose-Capillary: >600 mg/dL (ref 70–99)
Glucose-Capillary: 442 mg/dL — ABNORMAL HIGH (ref 70–99)
Glucose-Capillary: 564 mg/dL (ref 70–99)
Glucose-Capillary: 413 mg/dL — ABNORMAL HIGH (ref 70–99)
Glucose-Capillary: 322 mg/dL — ABNORMAL HIGH (ref 70–99)
Glucose-Capillary: 342 mg/dL — ABNORMAL HIGH (ref 70–99)
Glucose-Capillary: 277 mg/dL — ABNORMAL HIGH (ref 70–99)
Glucose-Capillary: 219 mg/dL — ABNORMAL HIGH (ref 70–99)

## 2020-09-06 LAB — BLOOD GAS, ARTERIAL
Acid-Base Excess: 4.8 mmol/L — ABNORMAL HIGH (ref 0.0–2.0)
Bicarbonate: 29.9 mmol/L — ABNORMAL HIGH (ref 20.0–28.0)
FIO2: 28
O2 Saturation: 97.1 %
Patient temperature: 98.6
pCO2 arterial: 48 mmHg (ref 32.0–48.0)
pH, Arterial: 7.411 (ref 7.350–7.450)
pO2, Arterial: 89.1 mmHg (ref 83.0–108.0)

## 2020-09-06 LAB — URINALYSIS, ROUTINE W REFLEX MICROSCOPIC
Bacteria, UA: NONE SEEN
Bilirubin Urine: NEGATIVE
Glucose, UA: >=500 mg/dL — AB
Ketones, ur: 20 mg/dL — AB
Leukocytes,Ua: NEGATIVE
Nitrite: NEGATIVE
Protein, ur: NEGATIVE mg/dL
Specific Gravity, Urine: 1.026 (ref 1.005–1.030)
pH: 5 (ref 5.0–8.0)

## 2020-09-06 LAB — LACTIC ACID, PLASMA
Lactic Acid, Venous: 7.8 mmol/L (ref 0.5–1.9)
Lactic Acid, Venous: 2.8 mmol/L (ref 0.5–1.9)

## 2020-09-06 LAB — D-DIMER, QUANTITATIVE: D-Dimer, Quant: <0.27 ug/mL-FEU (ref 0.00–0.50)

## 2020-09-06 LAB — LACTATE DEHYDROGENASE: LDH: 145 U/L (ref 98–192)

## 2020-09-06 LAB — RESP PANEL BY RT-PCR (FLU A&B, COVID) ARPGX2
Influenza A by PCR: NEGATIVE
Influenza B by PCR: NEGATIVE
SARS Coronavirus 2 by RT PCR: POSITIVE — AB

## 2020-09-06 LAB — OSMOLALITY: Osmolality: 397 mOsm/kg (ref 275–295)

## 2020-09-06 LAB — PROCALCITONIN: Procalcitonin: 0.48 ng/mL

## 2020-09-06 LAB — C-REACTIVE PROTEIN: CRP: 1.3 mg/dL — ABNORMAL HIGH (ref <1.0)

## 2020-09-06 LAB — I-STAT BETA HCG BLOOD, ED (MC, WL, AP ONLY): I-stat hCG, quantitative: 5.9 m[IU]/mL — ABNORMAL HIGH (ref <5)

## 2020-09-06 LAB — CK: Total CK: 69 U/L (ref 38–234)

## 2020-09-06 LAB — TROPONIN I (HIGH SENSITIVITY): Troponin I (High Sensitivity): 9 ng/L (ref <18)

## 2020-09-06 MED ORDER — SODIUM CHLORIDE 0.9 % IV SOLN: 200.0000 mg | Freq: Once | INTRAVENOUS | Status: DC

## 2020-09-06 MED ORDER — SODIUM CHLORIDE 0.9 % IV SOLN: 100.0000 mg | Freq: Every day | INTRAVENOUS | Status: DC

## 2020-09-06 MED ORDER — LACTATED RINGERS IV SOLN: INTRAVENOUS | Status: DC

## 2020-09-06 MED ORDER — DEXTROSE 50 % IV SOLN: 0.0000 mL | INTRAVENOUS | Status: DC | PRN

## 2020-09-06 MED ORDER — SODIUM CHLORIDE 0.9 % IV SOLN
200.0000 mg | Freq: Once | INTRAVENOUS | Status: AC
  Administered 2020-09-06: 200 mg via INTRAVENOUS
  Filled 2020-09-06: qty 200

## 2020-09-06 MED ORDER — LACTATED RINGERS IV BOLUS
2000.0000 mL | Freq: Once | INTRAVENOUS | Status: AC
  Administered 2020-09-06: 2000 mL via INTRAVENOUS

## 2020-09-06 MED ORDER — HEPARIN SODIUM (PORCINE) 5000 UNIT/ML IJ SOLN
5000.0000 [IU] | Freq: Three times a day (TID) | INTRAMUSCULAR | Status: DC
  Administered 2020-09-06 – 2020-09-09 (×9): 5000 [IU] via SUBCUTANEOUS
  Filled 2020-09-06 (×10): qty 1

## 2020-09-06 MED ORDER — INSULIN REGULAR(HUMAN) IN NACL 100-0.9 UT/100ML-% IV SOLN
INTRAVENOUS | Status: DC
  Administered 2020-09-06 (×2): 11.5 [IU]/h via INTRAVENOUS
  Administered 2020-09-07: 3.6 [IU]/h via INTRAVENOUS
  Filled 2020-09-06 (×3): qty 100

## 2020-09-06 MED ORDER — SODIUM CHLORIDE 0.9 % IV SOLN
100.0000 mg | Freq: Every day | INTRAVENOUS | Status: DC
  Administered 2020-09-07 – 2020-09-09 (×3): 100 mg via INTRAVENOUS
  Filled 2020-09-06 (×3): qty 20

## 2020-09-06 MED ORDER — LACTATED RINGERS IV BOLUS: 20.0000 mL/kg | Freq: Once | INTRAVENOUS | Status: DC

## 2020-09-06 MED ORDER — DEXTROSE IN LACTATED RINGERS 5 % IV SOLN: INTRAVENOUS | Status: DC

## 2020-09-06 MED ORDER — DEXAMETHASONE SODIUM PHOSPHATE 10 MG/ML IJ SOLN
6.0000 mg | Freq: Every day | INTRAMUSCULAR | Status: DC
  Administered 2020-09-06 – 2020-09-09 (×4): 6 mg via INTRAVENOUS
  Filled 2020-09-06 (×4): qty 1

## 2020-09-06 NOTE — ED Notes (Signed)
Date and time results received: 09/06/20 4:22 PM   Test: Covid Critical Value: positive  Name of Provider Notified: Stevphen Meuse  Orders Received? Or Actions Taken?: PA made aware

## 2020-09-06 NOTE — H&P (Addendum)
HPI  Edlin Ford JIR:[id number] DOB: May 28, 1966 DOA: 09/06/2020  PCP: [name], NP   Chief Complaint: DKA  HPI:  54 year old white female prior breast abscess diabetic foot ulcer treated in the past 07/26/2019 wound care Reactive airway disease depression on medications type 2 diabetes mellitus reflux and diabetic neuropathy Presented from home altered state Cannot obtain ROS--h/o taken from record, discussion with ED-Pa and talked to mother [phone] Mother relates hadn't eaten all week-had some milk shakes, no solids--sicker Monday and wasn't getting better--wasn't coherent At baseline fully functional--works at service station--has been living with parents for a long time She has Covid--some visitors came over [place] and they apparently had Covid Patients had both Covid vccines  Patient is Covid positive  Review of Systems:  Cannot obtain  Pertinent +'s: Pertinent -"s:  ED Course: Patient given 2 L fluids, placed on Glucomander drip, EKG performed,   Past Medical History:  Diagnosis Date  . Anxiety   . Asthma   . Depression   . Diabetes mellitus without complication ([place])   . Hyperlipidemia   . Hypertension   . Seasonal allergies    Past Surgical History:  Procedure Laterality Date  . BREAST EXCISIONAL BIOPSY Right   . BREAST SURGERY     right breast    reports that she has been smoking cigarettes. She has been smoking about 0.50 packs per day. She has never used smokeless tobacco. She reports that she does not drink alcohol and does not use drugs.  Mobility: Independent at baseline  Allergies  Allergen Reactions  . Erythromycin Other (See Comments)    Childhood reaction   Family History  Problem Relation Age of Onset  . Hypertension Mother   . Cancer Mother   . Kidney cancer Mother   . Cancer Father        prostate  . Breast cancer Paternal Grandmother   . Leukemia Brother    Prior to Admission medications   Medication Sig Start  Date End Date Taking? Authorizing Provider  albuterol (PROVENTIL HFA;VENTOLIN HFA) 108 (90 Base) MCG/ACT inhaler Inhale 2 puffs into the lungs every 4 (four) hours as needed for wheezing or shortness of breath. 10/21/16  Yes Muthersbaugh, Jarrett Soho, PA-C  albuterol (PROVENTIL) (2.5 MG/3ML) 0.083% nebulizer solution Take 3 mLs (2.5 mg total) by nebulization every 6 (six) hours as needed for wheezing or shortness of breath. 10/21/16  Yes Muthersbaugh, Jarrett Soho, PA-C  atorvastatin (LIPITOR) 20 MG tablet Take 1 tablet (20 mg total) by mouth daily. 07/25/20  Yes [name], NP  Dexlansoprazole (DEXILANT) 30 MG capsule Take 30 mg by mouth daily.   Yes [provider]  divalproex (DEPAKOTE) 500 MG DR tablet Take 500 mg by mouth 2 (two) times daily.   Yes [provider]  escitalopram (LEXAPRO) 20 MG tablet Take 1 tablet (20 mg total) by mouth daily. 07/25/20 10/23/20 Yes [name], NP  gabapentin (NEURONTIN) 300 MG capsule Take 300 mg by mouth 2 (two) times daily.   Yes [provider]  hydrOXYzine (ATARAX/VISTARIL) 25 MG tablet Take 1 tablet (25 mg total) by mouth every 8 (eight) hours as needed for anxiety. 07/25/20  Yes [name], NP  insulin glargine (LANTUS) 100 UNIT/ML Solostar Pen Inject 45 Units into the skin daily at 10 pm. 07/25/20 11/02/20 Yes Charlott Rakes, MD  lisinopril (ZESTRIL) 10 MG tablet Take 1 tablet (10 mg total) by mouth in the morning and at bedtime. 07/25/20 10/23/20 Yes [name],  NP  loratadine (CLARITIN) 10 MG tablet Take 10 mg by mouth daily.   Yes [provider]  montelukast (SINGULAIR) 10 MG tablet Take 1 tablet (10 mg total) by mouth at bedtime. 07/25/20 10/23/20 Yes [name], NP  TRUEplus Lancets 28G MISC Use as instructed. Check blood glucose level by fingerstick twice per day. 07/25/20  Yes [name], NP  Blood Glucose Monitoring Suppl (TRUE METRIX METER) w/Device KIT Use as instructed. Check blood glucose  level by fingerstick twice per day. 07/25/20   [name], NP  glucose blood (TRUE METRIX BLOOD GLUCOSE TEST) test strip Use as instructed. Check blood glucose level by fingerstick twice per day. 07/25/20   [name], NP  pantoprazole (PROTONIX) 40 MG tablet Take 1 tablet (40 mg total) by mouth daily. Patient not taking: Reported on 09/06/2020 07/25/20   [name], NP    Physical Exam:  Vitals:   09/06/20 1545 09/06/20 1615  BP: (!) 151/86 136/74  Pulse: 88 89  Resp: (!) 27 (!) 26  Temp:    SpO2: 98% 96%     Lethargic answers monosyllabic clearly pupils reactive flailing arms though and unable to really have a meaningful discussion  Estimated GCS about 10  Thick neck hirsute  S1-S2 mild tachycardia  Abdomen soft no rebound no guarding  No lower extremity edema  Integumentary intact to my exam  I have personally reviewed following labs and imaging studies  Labs:   Sodium 135 potassium 6.3 glucose 1292  BUNs/creatinine 5/1.02-->58/2.9  Lactic acid 7.8   Imaging studies:   CT head no acute findings  Medical tests:   EKG independently reviewed: Sinus rhythm  Test discussed with performing physician:  Yes  Decision to obtain old records:   Yes  Review and summation of old records:   Yes  Active Problems:   DKA (diabetic ketoacidosis) ([place])   COVID-19 virus infection-probably omicron without respiratory symptoms   Bipolar 1 disorder (HCC)   Breast mass status post excision   Smoker   Lactic acidosis on admission probably noninfectious   HONK Glucomander, n.p.o., blood gas requested by ED We will curbside pulmonology to be aware given her lethargy and GCS Received 2 L of fluids continuing LR 125 cc/h Admit to stepdown Keep on clear liquids if awakens until gap closes and more coherent Transition orders to be placed once gap is closed-nursing to contact attending physician for this order Hyperkalemia AKI on admission Holding  prior to admission lisinopril 10 Fluids as above Likely omicron Covid-main symptoms not pulmonary but diarrhea and GI Patient vaccinated-start remdesivir likely 3-day duration Would hold steroids at this juncture given DKA but may need to consider if CRP is elevated No labs performed in ED therefore obtain all labs now Lactic acidosis 2/2 combination DKA as well as diarrhea Expectantly will resolve Bipolar Holding hydroxyzine 25 every 8, Depakote 500 twice daily, gabapentin 300 twice daily Hopeful for improvement but if not we will get Depakote level/ammonia to rule out confusion from this state Prior smoker Obesity Prior breast mass status post excision   Severity of Illness: The appropriate patient status for this patient is INPATIENT. Inpatient status is judged to be reasonable and necessary in order to provide the required intensity of service to ensure the patient's safety. The patient's presenting symptoms, physical exam findings, and initial radiographic and laboratory data in the context of their chronic comorbidities is felt to place them at high risk for further clinical deterioration. Furthermore,  it is not anticipated that the patient will be medically stable for discharge from the hospital within 2 midnights of admission. The following factors support the patient status of inpatient.   " The patient's presenting symptoms include confusion metabolic encephalopathy. " The worrisome physical exam findings include markedly confused. " The initial radiographic and laboratory data are worrisome because of electrolyte abnormalities. " The chronic co-morbidities include diabetes.   * I certify that at the point of admission it is my clinical judgment that the patient will require inpatient hospital care spanning beyond 2 midnights from the point of admission due to high intensity of service, high risk for further deterioration and high frequency of surveillance required.*    DVT  prophylaxis:heparin Code Status: Full Family Communication: discussed in detail with mother on ophone [number above[ Consults called: CCM asked to keep an eye on patient if decompensates overnight-no formal consult placed  Time spent: 57 minutes  Verlon Au, MD Jerl Mina my NP partners at night for Care related issues] [facility] --[facility]ia [employer] [facility] , [email or web address]; password [facility]  09/06/2020, 5:10 PM

## 2020-09-06 NOTE — ED Notes (Addendum)
Date and time results received: 09/06/20 3:02 PM   Test: Glucose Critical Value: 1191  Test: Potassium Critical Value: 6.3  Name of Provider Notified: Stevphen Meuse PA  Orders Received? Or Actions Taken?: Actions Taken: PA made aware

## 2020-09-06 NOTE — ED Notes (Signed)
Endo tool recommends rate remain the same. 11.5units/ hr.

## 2020-09-06 NOTE — ED Provider Notes (Signed)
[facility] DEPT Provider Note   CSN: [id number] Arrival date & time: 09/06/20  6440     History Chief Complaint  Patient presents with  . Hyperglycemia    Carolyn Norton is a 54 y.o. female.  HPI Patient is a 54 year old female past medical history of asthma, depression, HLD, HTN, and diabetes type 2 on 40 units of the long-acting insulin daily.  Level 5 caveat due to altered mental status  Patient unable to provide history due to altered mental status.  Patient's mother [name] was able to be reached over the phone.  She states that the patient went to work on Sunday seemed to be more fatigued after work.  She states on Monday patient had some diarrhea and was peeing more frequently.  She also seem to have some nausea but no emesis.  Mother states the patient stays with her/lives with her.  She states that she did not keep close tabs on patient on Tuesday but is fairly certain that patient stopped taking her insulin on Monday has not taken any since.  Patient is prescribed 40 units of long-acting insulin per day.  Mother states that on Wednesday patient seem to be more confused and lethargic.  This is worsened until she was brought in by EMS with a CBG of 44.  She was oriented to self.  She has stopped eating or drinking anything.  She had 200 cc of normal saline given by EMS.      Past Medical History:  Diagnosis Date  . Anxiety   . Asthma   . Depression   . Diabetes mellitus without complication ([place])   . Hyperlipidemia   . Hypertension   . Seasonal allergies     There are no problems to display for this patient.   Past Surgical History:  Procedure Laterality Date  . BREAST EXCISIONAL BIOPSY Right   . BREAST SURGERY     right breast     OB History    Gravida  0   Para  0   Term  0   Preterm  0   AB  0   Living  0     SAB  0   IAB  0   Ectopic  0   Multiple  0   Live Births  0           Family History   Problem Relation Age of Onset  . Hypertension Mother   . Cancer Mother   . Kidney cancer Mother   . Cancer Father        prostate  . Breast cancer Paternal Grandmother   . Leukemia Brother     Social History   Tobacco Use  . Smoking status: Current Every Day Smoker    Packs/day: 0.50    Types: Cigarettes  . Smokeless tobacco: Never Used  Vaping Use  . Vaping Use: Never used  Substance Use Topics  . Alcohol use: No  . Drug use: No    Home Medications Prior to Admission medications   Medication Sig Start Date End Date Taking? Authorizing Provider  albuterol (PROVENTIL HFA;VENTOLIN HFA) 108 (90 Base) MCG/ACT inhaler Inhale 2 puffs into the lungs every 4 (four) hours as needed for wheezing or shortness of breath. 10/21/16  Yes Muthersbaugh, Jarrett Soho, PA-C  albuterol (PROVENTIL) (2.5 MG/3ML) 0.083% nebulizer solution Take 3 mLs (2.5 mg total) by nebulization every 6 (six) hours as needed for wheezing or shortness of breath. 10/21/16  Yes [place],  Hannah, PA-C  atorvastatin (LIPITOR) 20 MG tablet Take 1 tablet (20 mg total) by mouth daily. 07/25/20  Yes [name], NP  Dexlansoprazole (DEXILANT) 30 MG capsule Take 30 mg by mouth daily.   Yes [provider]  divalproex (DEPAKOTE) 500 MG DR tablet Take 500 mg by mouth 2 (two) times daily.   Yes [provider]  escitalopram (LEXAPRO) 20 MG tablet Take 1 tablet (20 mg total) by mouth daily. 07/25/20 10/23/20 Yes [name], NP  gabapentin (NEURONTIN) 300 MG capsule Take 300 mg by mouth 2 (two) times daily.   Yes [provider]  hydrOXYzine (ATARAX/VISTARIL) 25 MG tablet Take 1 tablet (25 mg total) by mouth every 8 (eight) hours as needed for anxiety. 07/25/20  Yes [name], NP  insulin glargine (LANTUS) 100 UNIT/ML Solostar Pen Inject 45 Units into the skin daily at 10 pm. 07/25/20 11/02/20 Yes Charlott Rakes, MD  lisinopril (ZESTRIL) 10 MG tablet Take 1 tablet (10 mg total) by mouth in  the morning and at bedtime. 07/25/20 10/23/20 Yes [name], NP  loratadine (CLARITIN) 10 MG tablet Take 10 mg by mouth daily.   Yes [provider]  montelukast (SINGULAIR) 10 MG tablet Take 1 tablet (10 mg total) by mouth at bedtime. 07/25/20 10/23/20 Yes [name], NP  TRUEplus Lancets 28G MISC Use as instructed. Check blood glucose level by fingerstick twice per day. 07/25/20  Yes [name], NP  Blood Glucose Monitoring Suppl (TRUE METRIX METER) w/Device KIT Use as instructed. Check blood glucose level by fingerstick twice per day. 07/25/20   [name], NP  glucose blood (TRUE METRIX BLOOD GLUCOSE TEST) test strip Use as instructed. Check blood glucose level by fingerstick twice per day. 07/25/20   [name], NP  pantoprazole (PROTONIX) 40 MG tablet Take 1 tablet (40 mg total) by mouth daily. Patient not taking: Reported on 09/06/2020 07/25/20   [name], NP    Allergies    Erythromycin  Review of Systems   Review of Systems  Unable to perform ROS: Mental status change    Physical Exam Updated Vital Signs BP 136/74   Pulse 89   Temp 98.9 F (37.2 C) (Rectal)   Resp (!) 26   Ht 5' 4"  (1.626 m)   Wt 90.7 kg   LMP 01/26/2017 (Approximate)   SpO2 96%   BMI 34.33 kg/m   Physical Exam Vitals and nursing note reviewed.  Constitutional:      General: She is not in acute distress.    Appearance: She is ill-appearing.     Comments: Lethargic altered 54 year old female appears older than stated age, appears acutely ill  HENT:     Head: Normocephalic and atraumatic.     Nose: Nose normal.     Mouth/Throat:     Mouth: Mucous membranes are dry.     Comments: Extremely dry oral mucosa Eyes:     General: No scleral icterus. Cardiovascular:     Rate and Rhythm: Normal rate and regular rhythm.     Pulses: Normal pulses.     Heart sounds: Normal heart sounds.  Pulmonary:     Effort: No respiratory distress.     Breath sounds: No  wheezing.     Comments: Coarse lung sounds throughout.  Mild tachypnea with a rate of twenty Abdominal:     Palpations: Abdomen is soft.     Tenderness: There is no abdominal tenderness. There is no  guarding or rebound.  Musculoskeletal:     Cervical back: Normal range of motion.     Right lower leg: No edema.     Left lower leg: No edema.  Skin:    General: Skin is warm and dry.     Capillary Refill: Capillary refill takes less than 2 seconds.     Comments: Skin with poor turgor  Neurological:     Mental Status: She is alert.     Comments: AO to self Is able to follow some basic commands.  Localizes this immediately the pain and will respond with "yes "to verbal stimulus.  Moving all 4 extremities with symmetric strength.  Psychiatric:        Mood and Affect: Mood normal.        Behavior: Behavior normal.     ED Results / Procedures / Treatments   Labs (all labs ordered are listed, but only abnormal results are displayed) Labs Reviewed  RESP PANEL BY RT-PCR (FLU A&B, COVID) ARPGX2 - Abnormal; Notable for the following components:      Result Value   SARS Coronavirus 2 by RT PCR POSITIVE (*)    All other components within normal limits  BASIC METABOLIC PANEL - Abnormal; Notable for the following components:   Potassium 5.9 (*)    Chloride 89 (*)    Glucose, Bld 1,292 (*)    BUN 58 (*)    Creatinine, Ser 2.94 (*)    GFR, Estimated 18 (*)    Anion gap 23 (*)    All other components within normal limits  CBC - Abnormal; Notable for the following components:   RBC 5.56 (*)    Hemoglobin 15.9 (*)    HCT 51.5 (*)    All other components within normal limits  URINALYSIS, ROUTINE W REFLEX MICROSCOPIC - Abnormal; Notable for the following components:   Color, Urine STRAW (*)    Glucose, UA >=500 (*)    Hgb urine dipstick SMALL (*)    Ketones, ur 20 (*)    All other components within normal limits  LACTIC ACID, PLASMA - Abnormal; Notable for the following components:    Lactic Acid, Venous 7.8 (*)    All other components within normal limits  BASIC METABOLIC PANEL - Abnormal; Notable for the following components:   Potassium 6.3 (*)    Chloride 88 (*)    Glucose, Bld 1,191 (*)    BUN 57 (*)    Creatinine, Ser 2.98 (*)    GFR, Estimated 18 (*)    Anion gap 24 (*)    All other components within normal limits  CBG MONITORING, ED - Abnormal; Notable for the following components:   Glucose-Capillary >600 (*)    All other components within normal limits  CBG MONITORING, ED - Abnormal; Notable for the following components:   Glucose-Capillary >600 (*)    All other components within normal limits  I-STAT BETA HCG BLOOD, ED ([facility], [facility], [facility] ONLY) - Abnormal; Notable for the following components:   I-stat hCG, quantitative 5.9 (*)    All other components within normal limits  CBG MONITORING, ED - Abnormal; Notable for the following components:   Glucose-Capillary >600 (*)    All other components within normal limits  CBG MONITORING, ED - Abnormal; Notable for the following components:   Glucose-Capillary >600 (*)    All other components within normal limits  CBG MONITORING, ED - Abnormal; Notable for the following components:   Glucose-Capillary >600 (*)  All other components within normal limits  CBG MONITORING, ED - Abnormal; Notable for the following components:   Glucose-Capillary >600 (*)    All other components within normal limits  CK  LACTIC ACID, PLASMA  BASIC METABOLIC PANEL  BASIC METABOLIC PANEL  BASIC METABOLIC PANEL  OSMOLALITY  HEMOGLOBIN M7J  BASIC METABOLIC PANEL  BLOOD GAS, ARTERIAL  TROPONIN I (HIGH SENSITIVITY)    EKG EKG Interpretation  Date/Time:  Friday September 06 2020 14:03:20 EST Ventricular Rate:  96 PR Interval:    QRS Duration: 96 QT Interval:  364 QTC Calculation: 460 R Axis:   23 Text Interpretation: Sinus rhythm Probable left atrial enlargement Anterolateral infarct, age indeterminate TWI new since previous  Confirmed by Wandra Arthurs ([id number]) on 09/06/2020 3:14:37 PM   Radiology CT Head Wo Contrast  Result Date: 09/06/2020 CLINICAL DATA:  Altered mental status. EXAM: CT HEAD WITHOUT CONTRAST TECHNIQUE: Contiguous axial images were obtained from the base of the skull through the vertex without intravenous contrast. COMPARISON:  Head CT dated 05/14/2004 FINDINGS: Brain: Generalized parenchymal volume loss with commensurate dilatation of the ventricles and sulci, moderate to severe for age. Mild chronic small vessel ischemic changes of the bilateral deep periventricular white matter regions. No mass, hemorrhage, edema or other evidence of acute parenchymal abnormality. No extra-axial hemorrhage. Vascular: Chronic calcified atherosclerotic changes of the large vessels at the skull base. No unexpected hyperdense vessel. Skull: Normal. Negative for fracture or focal lesion. Sinuses/Orbits: No acute finding. Other: None. IMPRESSION: 1. No acute findings. No intracranial mass, hemorrhage or edema. 2. Atrophy and chronic small vessel ischemic changes in the white matter. Electronically Signed   By: Franki [name] M.D.   On: 09/06/2020 14:24   DG Chest Portable 1 View  Result Date: 09/06/2020 CLINICAL DATA:  Shortness of breath. EXAM: PORTABLE CHEST 1 VIEW COMPARISON:  December 12, 2017. FINDINGS: The heart size and mediastinal contours are within normal limits. Both lungs are clear. No pneumothorax or pleural effusion is noted. The visualized skeletal structures are unremarkable. IMPRESSION: No active disease. Electronically Signed   By: Marijo Conception M.D.   On: 09/06/2020 12:43    Procedures .Critical Care Performed by: Tedd Sias, PA Authorized by: Tedd Sias, PA   Critical care provider statement:    Critical care time (minutes):  35   Critical care time was exclusive of:  Separately billable procedures and treating other patients and teaching time   Critical care was necessary to treat or  prevent imminent or life-threatening deterioration of the following conditions:  Endocrine crisis and metabolic crisis   Critical care was time spent personally by me on the following activities:  Discussions with consultants, evaluation of patient's response to treatment, examination of patient, review of old charts, re-evaluation of patient's condition, pulse oximetry, ordering and review of radiographic studies, ordering and review of laboratory studies and ordering and performing treatments and interventions   I assumed direction of critical care for this patient from another provider in my specialty: no     (including critical care time)  Medications Ordered in ED Medications  insulin regular, human (MYXREDLIN) 100 units/ 100 mL infusion (11.5 Units/hr Intravenous New Bag/Given 09/06/20 1311)  lactated ringers infusion ( Intravenous New Bag/Given 09/06/20 1535)  dextrose 5 % in lactated ringers infusion (0 mLs Intravenous Hold 09/06/20 1320)  dextrose 50 % solution 0-50 mL (has no administration in time range)  lactated ringers bolus 2,000 mL (0 mLs Intravenous Stopped 09/06/20 1532)  ED Course  I have reviewed the triage vital signs and the nursing notes.  Pertinent labs & imaging results that were available during my care of the patient were reviewed by me and considered in my medical decision making (see chart for details).  Patient is a 54 year old female with past medical history of DM 2.  Presented today with altered mental status and hypoglycemia found to be in HHS.  Precipitating factors likely COVID-19 which she is positive for.  She has exposure that was about by her mother who was able to discuss patient's symptoms and presentation with.  She has significantly/severely elevated blood sugar roughly 1300 initially.  Mildly elevated potassium recheck to be 6.3.  This is to be corrected with insulin.  She is severely dehydrated.  Clinical Course as of 09/06/20 1656  Fri Sep 06, 2020  1641 SARS Coronavirus 2 by RT PCR(!): POSITIVE [WF]  1641 Glucose(!!): 1,191 [WF]  1641 Potassium(!!): 6.3 Discussed with attending physician.  Patient is already on insulin.  No medication for calcium gluconate temporization at this time.  EKG relatively unremarkable. [WF]  1641 Lactic Acid, Venous(!!): 7.8 Lactic acidosis.  Likely secondary to severe dehydration. [WF]  1642 Urinalysis, Routine w reflex microscopic Urine, Clean Catch(!) No evidence of infection on UA [WF]  1642 CBC(!) Evidence of severe dehydration erythrocytosis [WF]  1642 CK within normal limits.  BMP notable for AKI likely prerenal from severe dehydration. [WF]  1655 Discussed with Dr. Alinda Dooms who will see patient and discuss with critical care admit to ICU vs hospitalist service.  [WF]    Clinical Course User Index [WF] Tedd Sias, [place]   MDM Rules/Calculators/A&P                          Patient has myriad of reasons to be admitted to the hospital today.  She will need admission for AKI, hyperkalemia, COVID-19 diagnosis, HHS.  I discussed this case with my attending physician who cosigned this note including patient's presenting symptoms, physical exam, and planned diagnostics and interventions. Attending physician stated agreement with plan or made changes to plan which were implemented.   Attending physician assessed patient at bedside.  Final Clinical Impression(s) / ED Diagnoses Final diagnoses:  Hyperosmolar hyperglycemic state (HHS) ([place])  Altered mental status, unspecified altered mental status type  COVID-19  Hyperglycemia  Hyperkalemia  AKI (acute kidney injury) [facility])    Rx / DC Orders ED Discharge Orders    None       Tedd Sias, [place] 09/06/20 1724    Drenda Freeze, MD 09/06/20 2322

## 2020-09-06 NOTE — ED Triage Notes (Signed)
Per EMS-coming from home, family states bed ridden for 5 days-CBG 484-oriented x2-poor PO intake-200 cc of NS given in route-covid exposure 5 days ago-coming of body aches-has not been taking insulin

## 2020-09-06 NOTE — ED Notes (Addendum)
Date and time results received: 09/06/20 2:34 PM   Test: Lactic  Critical Value: 7.8  Name of Provider Notified: Stevphen Meuse PA  Orders Received? Or Actions Taken?: PA made aware

## 2020-09-07 DIAGNOSIS — E872 Acidosis: Secondary | ICD-10-CM

## 2020-09-07 DIAGNOSIS — U071 COVID-19: Principal | ICD-10-CM

## 2020-09-07 DIAGNOSIS — E1111 Type 2 diabetes mellitus with ketoacidosis with coma: Secondary | ICD-10-CM

## 2020-09-07 DIAGNOSIS — F319 Bipolar disorder, unspecified: Secondary | ICD-10-CM

## 2020-09-07 LAB — CBC WITH DIFFERENTIAL/PLATELET
Abs Immature Granulocytes: 0.09 10*3/uL — ABNORMAL HIGH (ref 0.00–0.07)
Basophils Absolute: 0 10*3/uL (ref 0.0–0.1)
Basophils Relative: 0 %
Eosinophils Absolute: 0 10*3/uL (ref 0.0–0.5)
Eosinophils Relative: 0 %
HCT: 46.3 % — ABNORMAL HIGH (ref 36.0–46.0)
Hemoglobin: 15.2 g/dL — ABNORMAL HIGH (ref 12.0–15.0)
Immature Granulocytes: 1 %
Lymphocytes Relative: 14 %
Lymphs Abs: 1.9 10*3/uL (ref 0.7–4.0)
MCH: 28.1 pg (ref 26.0–34.0)
MCHC: 32.8 g/dL (ref 30.0–36.0)
MCV: 85.6 fL (ref 80.0–100.0)
Monocytes Absolute: 1 10*3/uL (ref 0.1–1.0)
Monocytes Relative: 7 %
Neutro Abs: 10.9 10*3/uL — ABNORMAL HIGH (ref 1.7–7.7)
Neutrophils Relative %: 78 %
Platelets: 201 10*3/uL (ref 150–400)
RBC: 5.41 MIL/uL — ABNORMAL HIGH (ref 3.87–5.11)
RDW: 14.2 % (ref 11.5–15.5)
WBC: 13.9 10*3/uL — ABNORMAL HIGH (ref 4.0–10.5)
nRBC: 0 % (ref 0.0–0.2)

## 2020-09-07 LAB — BASIC METABOLIC PANEL
Anion gap: 10 (ref 5–15)
BUN: 43 mg/dL — ABNORMAL HIGH (ref 6–20)
CO2: 31 mmol/L (ref 22–32)
Calcium: 9.5 mg/dL (ref 8.9–10.3)
Chloride: 112 mmol/L — ABNORMAL HIGH (ref 98–111)
Creatinine, Ser: 1.65 mg/dL — ABNORMAL HIGH (ref 0.44–1.00)
GFR, Estimated: 37 mL/min — ABNORMAL LOW (ref >60)
Glucose, Bld: 226 mg/dL — ABNORMAL HIGH (ref 70–99)
Potassium: 4.3 mmol/L (ref 3.5–5.1)
Sodium: 153 mmol/L — ABNORMAL HIGH (ref 135–145)

## 2020-09-07 LAB — RAPID URINE DRUG SCREEN, HOSP PERFORMED
Amphetamines: NOT DETECTED
Barbiturates: NOT DETECTED
Benzodiazepines: NOT DETECTED
Cocaine: NOT DETECTED
Opiates: NOT DETECTED
Tetrahydrocannabinol: NOT DETECTED

## 2020-09-07 LAB — CBG MONITORING, ED
Glucose-Capillary: 112 mg/dL — ABNORMAL HIGH (ref 70–99)
Glucose-Capillary: 144 mg/dL — ABNORMAL HIGH (ref 70–99)
Glucose-Capillary: 149 mg/dL — ABNORMAL HIGH (ref 70–99)
Glucose-Capillary: 168 mg/dL — ABNORMAL HIGH (ref 70–99)
Glucose-Capillary: 170 mg/dL — ABNORMAL HIGH (ref 70–99)
Glucose-Capillary: 170 mg/dL — ABNORMAL HIGH (ref 70–99)
Glucose-Capillary: 186 mg/dL — ABNORMAL HIGH (ref 70–99)
Glucose-Capillary: 195 mg/dL — ABNORMAL HIGH (ref 70–99)
Glucose-Capillary: 205 mg/dL — ABNORMAL HIGH (ref 70–99)
Glucose-Capillary: 205 mg/dL — ABNORMAL HIGH (ref 70–99)
Glucose-Capillary: 227 mg/dL — ABNORMAL HIGH (ref 70–99)
Glucose-Capillary: 244 mg/dL — ABNORMAL HIGH (ref 70–99)
Glucose-Capillary: 254 mg/dL — ABNORMAL HIGH (ref 70–99)
Glucose-Capillary: 265 mg/dL — ABNORMAL HIGH (ref 70–99)
Glucose-Capillary: 360 mg/dL — ABNORMAL HIGH (ref 70–99)

## 2020-09-07 LAB — GLUCOSE, CAPILLARY
Glucose-Capillary: 121 mg/dL — ABNORMAL HIGH (ref 70–99)
Glucose-Capillary: 156 mg/dL — ABNORMAL HIGH (ref 70–99)
Glucose-Capillary: 161 mg/dL — ABNORMAL HIGH (ref 70–99)
Glucose-Capillary: 188 mg/dL — ABNORMAL HIGH (ref 70–99)
Glucose-Capillary: 189 mg/dL — ABNORMAL HIGH (ref 70–99)

## 2020-09-07 LAB — COMPREHENSIVE METABOLIC PANEL
ALT: 14 U/L (ref 0–44)
AST: 21 U/L (ref 15–41)
Albumin: 3.6 g/dL (ref 3.5–5.0)
Alkaline Phosphatase: 67 U/L (ref 38–126)
Anion gap: 11 (ref 5–15)
BUN: 41 mg/dL — ABNORMAL HIGH (ref 6–20)
CO2: 33 mmol/L — ABNORMAL HIGH (ref 22–32)
Calcium: 9.5 mg/dL (ref 8.9–10.3)
Chloride: 111 mmol/L (ref 98–111)
Creatinine, Ser: 1.44 mg/dL — ABNORMAL HIGH (ref 0.44–1.00)
GFR, Estimated: 43 mL/min — ABNORMAL LOW (ref >60)
Glucose, Bld: 173 mg/dL — ABNORMAL HIGH (ref 70–99)
Potassium: 4 mmol/L (ref 3.5–5.1)
Sodium: 155 mmol/L — ABNORMAL HIGH (ref 135–145)
Total Bilirubin: 0.3 mg/dL (ref 0.3–1.2)
Total Protein: 7.2 g/dL (ref 6.5–8.1)

## 2020-09-07 LAB — D-DIMER, QUANTITATIVE: D-Dimer, Quant: <0.27 ug/mL-FEU (ref 0.00–0.50)

## 2020-09-07 LAB — C-REACTIVE PROTEIN: CRP: 1 mg/dL — ABNORMAL HIGH (ref <1.0)

## 2020-09-07 LAB — HEMOGLOBIN A1C
Hgb A1c MFr Bld: 11.8 % — ABNORMAL HIGH (ref 4.8–5.6)
Mean Plasma Glucose: 292 mg/dL

## 2020-09-07 LAB — MRSA PCR SCREENING: MRSA by PCR: NEGATIVE

## 2020-09-07 MED ORDER — THIAMINE HCL 100 MG/ML IJ SOLN
100.0000 mg | Freq: Every day | INTRAMUSCULAR | Status: DC
  Administered 2020-09-08: 100 mg via INTRAVENOUS
  Filled 2020-09-07: qty 2

## 2020-09-07 MED ORDER — LORAZEPAM 2 MG/ML IJ SOLN: 1.0000 mg | INTRAMUSCULAR | Status: DC | PRN

## 2020-09-07 MED ORDER — CHLORHEXIDINE GLUCONATE 0.12 % MT SOLN
15.0000 mL | Freq: Two times a day (BID) | OROMUCOSAL | Status: DC
  Administered 2020-09-07 – 2020-09-09 (×4): 15 mL via OROMUCOSAL
  Filled 2020-09-07 (×3): qty 15

## 2020-09-07 MED ORDER — INSULIN GLARGINE 100 UNIT/ML ~~LOC~~ SOLN
45.0000 [IU] | Freq: Every day | SUBCUTANEOUS | Status: DC
  Filled 2020-09-07: qty 0.45

## 2020-09-07 MED ORDER — INSULIN GLARGINE 100 UNIT/ML SOLOSTAR PEN: 45.0000 [IU] | PEN_INJECTOR | Freq: Every day | SUBCUTANEOUS | Status: DC

## 2020-09-07 MED ORDER — LORAZEPAM 1 MG PO TABS
1.0000 mg | ORAL_TABLET | ORAL | Status: DC | PRN
  Administered 2020-09-08: 1 mg via ORAL
  Filled 2020-09-07: qty 1

## 2020-09-07 MED ORDER — METOPROLOL TARTRATE 5 MG/5ML IV SOLN
5.0000 mg | INTRAVENOUS | Status: AC | PRN
Start: 2020-09-07 — End: 2020-09-08
  Administered 2020-09-07 – 2020-09-08 (×2): 5 mg via INTRAVENOUS
  Filled 2020-09-07: qty 5

## 2020-09-07 MED ORDER — FOLIC ACID 1 MG PO TABS
1.0000 mg | ORAL_TABLET | Freq: Every day | ORAL | Status: DC
  Administered 2020-09-08 – 2020-09-09 (×2): 1 mg via ORAL
  Filled 2020-09-07 (×2): qty 1

## 2020-09-07 MED ORDER — THIAMINE HCL 100 MG PO TABS
100.0000 mg | ORAL_TABLET | Freq: Every day | ORAL | Status: DC
  Administered 2020-09-09: 100 mg via ORAL
  Filled 2020-09-07 (×2): qty 1

## 2020-09-07 MED ORDER — ORAL CARE MOUTH RINSE
15.0000 mL | Freq: Two times a day (BID) | OROMUCOSAL | Status: DC
  Administered 2020-09-07 – 2020-09-08 (×2): 15 mL via OROMUCOSAL

## 2020-09-07 MED ORDER — ADULT MULTIVITAMIN W/MINERALS CH
1.0000 | ORAL_TABLET | Freq: Every day | ORAL | Status: DC
  Administered 2020-09-08 – 2020-09-09 (×2): 1 via ORAL
  Filled 2020-09-07 (×2): qty 1

## 2020-09-07 MED ORDER — ORAL CARE MOUTH RINSE
15.0000 mL | Freq: Two times a day (BID) | OROMUCOSAL | Status: DC
  Administered 2020-09-08 – 2020-09-09 (×2): 15 mL via OROMUCOSAL

## 2020-09-07 MED ORDER — CHLORHEXIDINE GLUCONATE CLOTH 2 % EX PADS
6.0000 | MEDICATED_PAD | Freq: Every day | CUTANEOUS | Status: DC
  Administered 2020-09-07 – 2020-09-08 (×2): 6 via TOPICAL

## 2020-09-07 NOTE — ED Notes (Signed)
Pt. CBG 254, RN, Lyda Jester made aware.

## 2020-09-07 NOTE — ED Notes (Signed)
650 ml urine emptied from folly catheter Repositioned patient

## 2020-09-07 NOTE — ED Notes (Signed)
Fall Bundle: Fall wristband Yellow socks Bed alarm on Fall risk sign on door

## 2020-09-07 NOTE — ED Notes (Signed)
Patient received lunch tray 

## 2020-09-07 NOTE — ED Notes (Signed)
Patient received breakfast tray 

## 2020-09-07 NOTE — Progress Notes (Signed)
Rapid Response Event Note   Rounded on pt d/t holding in ED w/ SD level of care. Pt covid (+), viewed from window, seemingly sleeping. O2 sat 96% on 2L [place]. RR RN will continue to monitor remotely.  Elliot Cousin, RN

## 2020-09-07 NOTE — Progress Notes (Signed)
PROGRESS NOTE    Carolyn Norton  SRP:[id number] DOB: 07/08/1966 DOA: 09/06/2020 PCP: Claiborne Rigg, NP   Brief Narrative:  55 year old white female prior breast abscess diabetic foot ulcer treated in the past 07/26/2019 wound care. Reactive airway disease depression on medications type 2 diabetes mellitus reflux and diabetic neuropathy. Presented from home altered state. Cannot obtain ROS--h/o taken from record, discussion with ED-Pa and talked to mother. Mother relates hadn't eaten all week-had some milk shakes, no solids--sicker Monday and wasn't getting better--wasn't coherent. 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 She has Covid--some visitors came over [place] and they apparently had Covid. Patients had both Covid vaccines  Assessment & Plan:   Active Problems:   DKA (diabetic ketoacidosis) (HCC)   COVID-19 virus infection-probably omicron without respiratory symptoms   Bipolar 1 disorder (HCC)   Breast mass status post excision   Smoker   Lactic acidosis on admission probably noninfectious   Acute metabolic encephalopathy, likely multifactorial - Initiate CIWA protocol, ensure UDS has been taken first to limit cross contamination - Lengthy discussion with mother over the phone, patient does have known history of drug use, cocaine and questionable other pills and smokes medications but denies any known history of IV drug abuse - Patient also known to drink alcohol at home -but unknown quantity.  Of note patient declined at length this morning that she had ever consumed alcohol or taking any drugs - Will hold patient's home medications given high possibility of polypharmacy - Certainly patient's hyperglycemia, COVID-19 pneumonia and hypoxia could be playing a role in her mental status as well  Hyperglycemia and anion gap secondary to uncontrolled DM in nonketotic state, POA - Gap closed - transition to PO and long  acting insulin if able to tolerate PO well - Concern for noncompliance with regimen/diet at home  Keep on clear liquids if awakens until gap closes and more coherent - Transition orders to be placed once gap is closed-nursing to contact attending physician for this order  Hyperkalemia AKI on admission - Holding prior to admission lisinopril 10 - Fluids as above  Acute hypoxic respiratory failure secondary to COVID 19, resolved - Resolved - on RA at rest - Patient vaccinated - continue remdesivir/steroids - No labs performed in ED therefore obtain all labs now  Lactic acidosis 2/2 combination DKA as well as diarrhea Expectantly will resolve  Bipolar, disorder Holding all home meds given AME hydroxyzine 25 every 8, Depakote 500 twice daily, gabapentin 300 twice daily Hopeful for improvement but if not we will get Depakote level/ammonia to rule out confusion from this state  Prior smoker Obesity Prior breast mass status post excision  DVT prophylaxis: Heparin Code Status: Full Family Communication: Spoke to mother at length  Status is: Inpt  Dispo: The patient is from: Home              Anticipated d/c is to: TBD              Anticipated d/c date is: 72+hr              Patient currently NOT medically stable for discharge  Consultants:   None  Procedures:   None  Antimicrobials:  Remdesivir   Subjective: Still altered - ROS limited - no acute issues/events overnight  Objective: Vitals:   09/07/20 0245 09/07/20 0345 09/07/20 0515 09/07/20 0700  BP: (!) 156/96 (!) 167/96 (!) 168/96 (!) 156/86  Pulse: 79 74 72  73  Resp: 17 20  20   Temp:      TempSrc:      SpO2: 95% 96% 96% 97%  Weight:      Height:        Intake/Output Summary (Last 24 hours) at 09/07/2020 0824 Last data filed at 09/07/2020 0130 Gross per 24 hour  Intake 2250 ml  Output 1000 ml  Net 1250 ml   Filed Weights   09/06/20 0935  Weight: 90.7 kg    Examination:  General exam: Appears calm  and comfortable  Respiratory system: Clear to auscultation. Respiratory effort normal. Cardiovascular system: S1 & S2 heard, RRR. No JVD, murmurs, rubs, gallops or clicks. No pedal edema. Gastrointestinal system: Abdomen is nondistended, soft and nontender. No organomegaly or masses felt. Normal bowel sounds heard. Central nervous system: Alert and oriented. No focal neurological deficits. Extremities: Symmetric 5 x 5 power. Skin: No rashes, lesions or ulcers Psychiatry: Judgement and insight appear normal. Mood & affect appropriate.     Data Reviewed: I have personally reviewed following labs and imaging studies  CBC: Recent Labs  Lab 09/06/20 1031 09/07/20 0616  WBC 8.5 13.9*  NEUTROABS  --  10.9*  HGB 15.9* 15.2*  HCT 51.5* 46.3*  MCV 92.6 85.6  PLT 256 201   Basic Metabolic Panel: Recent Labs  Lab 09/06/20 1229 09/06/20 1900 09/06/20 1948 09/07/20 0029 09/07/20 0616  NA 135 150* 152* 153* 155*  K 6.3* 4.0 4.4 4.3 4.0  CL 88* 107 109 112* 111  CO2 23 26 28 31  33*  GLUCOSE 1,191* 530* 409* 226* 173*  BUN 57* 49* 47* 43* 41*  CREATININE 2.98* 2.13* 2.00* 1.65* 1.44*  CALCIUM 9.9 9.6 9.3 9.5 9.5   GFR: Estimated Creatinine Clearance: 49.3 mL/min (A) (by C-G formula based on SCr of 1.44 mg/dL (H)). Liver Function Tests: Recent Labs  Lab 09/07/20 0616  AST 21  ALT 14  ALKPHOS 67  BILITOT 0.3  PROT 7.2  ALBUMIN 3.6   No results for input(s): LIPASE, AMYLASE in the last 168 hours. No results for input(s): AMMONIA in the last 168 hours. Coagulation Profile: No results for input(s): INR, PROTIME in the last 168 hours. Cardiac Enzymes: Recent Labs  Lab 09/06/20 1229  CKTOTAL 69   BNP (last 3 results) No results for input(s): PROBNP in the last 8760 hours. HbA1C: Recent Labs    09/06/20 1234  HGBA1C 11.8*   CBG: Recent Labs  Lab 09/07/20 0247 09/07/20 0352 09/07/20 0453 09/07/20 0607 09/07/20 0718  GLUCAP 168* 149* 254* 170* 170*   Lipid  Profile: No results for input(s): CHOL, HDL, LDLCALC, TRIG, CHOLHDL, LDLDIRECT in the last 72 hours. Thyroid Function Tests: No results for input(s): TSH, T4TOTAL, FREET4, T3FREE, THYROIDAB in the last 72 hours. Anemia Panel: No results for input(s): VITAMINB12, FOLATE, FERRITIN, TIBC, IRON, RETICCTPCT in the last 72 hours. Sepsis Labs: Recent Labs  Lab 09/06/20 1228 09/06/20 1428 09/06/20 1900  PROCALCITON  --   --  0.48  LATICACIDVEN 7.8* 2.8*  --     Recent Results (from the past 240 hour(s))  Resp Panel by RT-PCR (Flu A&B, Covid) Nasopharyngeal Swab     Status: Abnormal   Collection Time: 09/06/20 12:27 PM   Specimen: Nasopharyngeal Swab; Nasopharyngeal(NP) swabs in vial transport medium  Result Value Ref Range Status   SARS Coronavirus 2 by RT PCR POSITIVE (A) NEGATIVE Final    Comment: RESULT CALLED TO, READ BACK BY AND VERIFIED WITH: S.LEONARD AT 1620 ON  09/06/20 BY N.THOMPSON (NOTE) SARS-CoV-2 target nucleic acids are DETECTED.  The SARS-CoV-2 RNA is generally detectable in upper respiratory specimens during the acute phase of infection. Positive results are indicative of the presence of the identified virus, but do not rule out bacterial infection or co-infection with other pathogens not detected by the test. Clinical correlation with patient history and other diagnostic information is necessary to determine patient infection status. The expected result is Negative.  Fact Sheet for Patients: EntrepreneurPulse.com.au  Fact Sheet for Healthcare Providers: IncredibleEmployment.be  This test is not yet approved or cleared by the [place] FDA and  has been authorized for detection and/or diagnosis of SARS-CoV-2 by FDA under an Emergency Use Authorization (EUA).  This EUA will remain in effect (meaning this t est can be used) for the duration of  the COVID-19 declaration under Section 564(b)(1) of the Act, 21 U.S.C. section  360bbb-3(b)(1), unless the authorization is terminated or revoked sooner.     Influenza A by PCR NEGATIVE NEGATIVE Final   Influenza B by PCR NEGATIVE NEGATIVE Final    Comment: (NOTE) The Xpert Xpress SARS-CoV-2/FLU/RSV plus assay is intended as an aid in the diagnosis of influenza from Nasopharyngeal swab specimens and should not be used as a sole basis for treatment. Nasal washings and aspirates are unacceptable for Xpert Xpress SARS-CoV-2/FLU/RSV testing.  Fact Sheet for Patients: EntrepreneurPulse.com.au  Fact Sheet for Healthcare Providers: IncredibleEmployment.be  This test is not yet approved or cleared by the [place] FDA and has been authorized for detection and/or diagnosis of SARS-CoV-2 by FDA under an Emergency Use Authorization (EUA). This EUA will remain in effect (meaning this test can be used) for the duration of the COVID-19 declaration under Section 564(b)(1) of the Act, 21 U.S.C. section 360bbb-3(b)(1), unless the authorization is terminated or revoked.  Performed at [facility], [place] [address], [place], [place] [address]          Radiology Studies: CT Head Wo Contrast  Result Date: 09/06/2020 CLINICAL DATA:  Altered mental status. EXAM: CT HEAD WITHOUT CONTRAST TECHNIQUE: Contiguous axial images were obtained from the base of the skull through the vertex without intravenous contrast. COMPARISON:  Head CT dated 05/14/2004 FINDINGS: Brain: Generalized parenchymal volume loss with commensurate dilatation of the ventricles and sulci, moderate to severe for age. Mild chronic small vessel ischemic changes of the bilateral deep periventricular white matter regions. No mass, hemorrhage, edema or other evidence of acute parenchymal abnormality. No extra-axial hemorrhage. Vascular: Chronic calcified atherosclerotic changes of the large vessels at the skull base. No unexpected hyperdense vessel. Skull:  Normal. Negative for fracture or focal lesion. Sinuses/Orbits: No acute finding. Other: None. IMPRESSION: 1. No acute findings. No intracranial mass, hemorrhage or edema. 2. Atrophy and chronic small vessel ischemic changes in the white matter. Electronically Signed   By: Franki [name] M.D.   On: 09/06/2020 14:24   DG Chest Portable 1 View  Result Date: 09/06/2020 CLINICAL DATA:  Shortness of breath. EXAM: PORTABLE CHEST 1 VIEW COMPARISON:  December 12, 2017. FINDINGS: The heart size and mediastinal contours are within normal limits. Both lungs are clear. No pneumothorax or pleural effusion is noted. The visualized skeletal structures are unremarkable. IMPRESSION: No active disease. Electronically Signed   By: Marijo Conception M.D.   On: 09/06/2020 12:43        Scheduled Meds: . dexamethasone (DECADRON) injection  6 mg Intravenous Daily  . heparin  5,000 Units Subcutaneous Q8H   Continuous Infusions: .  dextrose 5% lactated ringers 125 mL/hr at 09/06/20 2316  . insulin 2.8 Units/hr (09/07/20 0722)  . lactated ringers Stopped (09/06/20 2315)  . remdesivir 100 mg in NS 100 mL       LOS: 1 day   Time spent:  Azucena Fallen, DO [facility]  If 7PM-7AM, please contact night-coverage [email or web address]  09/07/2020, 8:24 AM

## 2020-09-07 NOTE — ED Notes (Signed)
Pt. CBG 149, RN, Lyda Jester made aware.

## 2020-09-08 DIAGNOSIS — F319 Bipolar disorder, unspecified: Secondary | ICD-10-CM

## 2020-09-08 DIAGNOSIS — E872 Acidosis: Secondary | ICD-10-CM

## 2020-09-08 LAB — GLUCOSE, CAPILLARY
Glucose-Capillary: 161 mg/dL — ABNORMAL HIGH (ref 70–99)
Glucose-Capillary: 141 mg/dL — ABNORMAL HIGH (ref 70–99)
Glucose-Capillary: 124 mg/dL — ABNORMAL HIGH (ref 70–99)
Glucose-Capillary: 147 mg/dL — ABNORMAL HIGH (ref 70–99)
Glucose-Capillary: 154 mg/dL — ABNORMAL HIGH (ref 70–99)
Glucose-Capillary: 174 mg/dL — ABNORMAL HIGH (ref 70–99)
Glucose-Capillary: 164 mg/dL — ABNORMAL HIGH (ref 70–99)
Glucose-Capillary: 204 mg/dL — ABNORMAL HIGH (ref 70–99)
Glucose-Capillary: 254 mg/dL — ABNORMAL HIGH (ref 70–99)

## 2020-09-08 LAB — CBC WITH DIFFERENTIAL/PLATELET
Abs Immature Granulocytes: 0.29 10*3/uL — ABNORMAL HIGH (ref 0.00–0.07)
Basophils Absolute: 0 10*3/uL (ref 0.0–0.1)
Basophils Relative: 0 %
Eosinophils Absolute: 0 10*3/uL (ref 0.0–0.5)
Eosinophils Relative: 0 %
HCT: 47.5 % — ABNORMAL HIGH (ref 36.0–46.0)
Hemoglobin: 15 g/dL (ref 12.0–15.0)
Immature Granulocytes: 1 %
Lymphocytes Relative: 13 %
Lymphs Abs: 3 10*3/uL (ref 0.7–4.0)
MCH: 27.9 pg (ref 26.0–34.0)
MCHC: 31.6 g/dL (ref 30.0–36.0)
MCV: 88.5 fL (ref 80.0–100.0)
Monocytes Absolute: 1.9 10*3/uL — ABNORMAL HIGH (ref 0.1–1.0)
Monocytes Relative: 8 %
Neutro Abs: 17.8 10*3/uL — ABNORMAL HIGH (ref 1.7–7.7)
Neutrophils Relative %: 78 %
Platelets: 212 10*3/uL (ref 150–400)
RBC: 5.37 MIL/uL — ABNORMAL HIGH (ref 3.87–5.11)
RDW: 14.5 % (ref 11.5–15.5)
WBC: 23 10*3/uL — ABNORMAL HIGH (ref 4.0–10.5)
nRBC: 0 % (ref 0.0–0.2)

## 2020-09-08 LAB — COMPREHENSIVE METABOLIC PANEL
ALT: 23 U/L (ref 0–44)
AST: 46 U/L — ABNORMAL HIGH (ref 15–41)
Albumin: 3.1 g/dL — ABNORMAL LOW (ref 3.5–5.0)
Alkaline Phosphatase: 57 U/L (ref 38–126)
Anion gap: 11 (ref 5–15)
BUN: 32 mg/dL — ABNORMAL HIGH (ref 6–20)
CO2: 31 mmol/L (ref 22–32)
Calcium: 9 mg/dL (ref 8.9–10.3)
Chloride: 113 mmol/L — ABNORMAL HIGH (ref 98–111)
Creatinine, Ser: 1.14 mg/dL — ABNORMAL HIGH (ref 0.44–1.00)
GFR, Estimated: 58 mL/min — ABNORMAL LOW (ref >60)
Glucose, Bld: 149 mg/dL — ABNORMAL HIGH (ref 70–99)
Potassium: 3.6 mmol/L (ref 3.5–5.1)
Sodium: 155 mmol/L — ABNORMAL HIGH (ref 135–145)
Total Bilirubin: 0.5 mg/dL (ref 0.3–1.2)
Total Protein: 6.5 g/dL (ref 6.5–8.1)

## 2020-09-08 LAB — D-DIMER, QUANTITATIVE: D-Dimer, Quant: <0.27 ug/mL-FEU (ref 0.00–0.50)

## 2020-09-08 LAB — C-REACTIVE PROTEIN: CRP: 0.6 mg/dL (ref <1.0)

## 2020-09-08 MED ORDER — INSULIN GLARGINE 100 UNIT/ML ~~LOC~~ SOLN
22.0000 [IU] | Freq: Every day | SUBCUTANEOUS | Status: DC
  Filled 2020-09-08: qty 0.22

## 2020-09-08 MED ORDER — LISINOPRIL 10 MG PO TABS
10.0000 mg | ORAL_TABLET | Freq: Every day | ORAL | Status: DC
Start: 2020-09-08 — End: 2020-09-09
  Administered 2020-09-08 – 2020-09-09 (×2): 10 mg via ORAL
  Filled 2020-09-08 (×2): qty 1

## 2020-09-08 MED ORDER — HYDRALAZINE HCL 20 MG/ML IJ SOLN
5.0000 mg | Freq: Three times a day (TID) | INTRAMUSCULAR | Status: DC | PRN
  Administered 2020-09-08: 5 mg via INTRAVENOUS
  Filled 2020-09-08: qty 1

## 2020-09-08 MED ORDER — INSULIN GLARGINE 100 UNIT/ML ~~LOC~~ SOLN
22.0000 [IU] | Freq: Every day | SUBCUTANEOUS | Status: DC
  Administered 2020-09-08: 22 [IU] via SUBCUTANEOUS
  Filled 2020-09-08: qty 0.22

## 2020-09-08 MED ORDER — DIVALPROEX SODIUM 500 MG PO DR TAB
500.0000 mg | DELAYED_RELEASE_TABLET | Freq: Two times a day (BID) | ORAL | Status: DC
  Administered 2020-09-09: 500 mg via ORAL
  Filled 2020-09-08: qty 1

## 2020-09-08 MED ORDER — INSULIN ASPART 100 UNIT/ML ~~LOC~~ SOLN
0.0000 [IU] | Freq: Three times a day (TID) | SUBCUTANEOUS | Status: DC
  Administered 2020-09-08: 7 [IU] via SUBCUTANEOUS
  Administered 2020-09-08: 11 [IU] via SUBCUTANEOUS
  Administered 2020-09-09 (×2): 7 [IU] via SUBCUTANEOUS

## 2020-09-08 MED ORDER — INSULIN ASPART 100 UNIT/ML ~~LOC~~ SOLN
0.0000 [IU] | Freq: Every day | SUBCUTANEOUS | Status: DC
  Administered 2020-09-08: 3 [IU] via SUBCUTANEOUS

## 2020-09-08 MED ORDER — ESCITALOPRAM OXALATE 20 MG PO TABS
20.0000 mg | ORAL_TABLET | Freq: Every day | ORAL | Status: DC
  Administered 2020-09-09: 20 mg via ORAL
  Filled 2020-09-08: qty 1

## 2020-09-08 MED ORDER — INSULIN GLARGINE 100 UNIT/ML ~~LOC~~ SOLN
45.0000 [IU] | Freq: Every day | SUBCUTANEOUS | Status: DC
  Administered 2020-09-08: 45 [IU] via SUBCUTANEOUS
  Filled 2020-09-08: qty 0.45

## 2020-09-08 NOTE — Progress Notes (Signed)
PROGRESS NOTE    Carolyn Norton  VEL:[id number] DOB: May 03, 1966 DOA: 09/06/2020 PCP: [name], NP   Brief Narrative:  55 year old white female prior breast abscess diabetic foot ulcer treated in the past 07/26/2019 wound care. Reactive airway disease depression on medications type 2 diabetes mellitus reflux and diabetic neuropathy. Presented from home altered state. Cannot obtain ROS--h/o taken from record, discussion with ED-Pa and talked to mother. Mother relates hadn't eaten all week-had some milk shakes, no solids--sicker Monday and wasn't getting better--wasn't coherent. 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 She has Covid--some visitors came over [place] and they apparently had Covid. Patients had both Covid vaccines  Assessment & Plan:   Active Problems:   DKA (diabetic ketoacidosis) ([place])   COVID-19 virus infection-probably omicron without respiratory symptoms   Bipolar 1 disorder (HCC)   Breast mass status post excision   Smoker   Lactic acidosis on admission probably noninfectious   Acute metabolic encephalopathy, likely multifactorial -Mental status resolving, still unclear etiology concern for polypharmacy, possible withdrawal but at this point resolving, continue clinical support only. - UDS negative, patient did not require CIWA scoring very low over the past 24 hours. - Lengthy discussion with mother over the phone, patient does have known history of drug use, cocaine and questionable other pills and smokes medications, but denies any known history of IV drug abuse -Wait to resume home psych medications until tomorrow, if patient has worsening mental status will continue to hold his medications - Certainly patient's hyperglycemia, COVID-19 pneumonia and hypoxia could be playing a role in her mental status as well, but less likely given the above  Hyperglycemia and anion gap secondary to uncontrolled  DM in nonketotic state, POA, resolving - Gap closed -advance diet, resume home long-acting insulin, sliding scale and hypoglycemic protocol - Patient admits to marked noncompliance with regimen/diet at home  Hyperkalemia AKI on admission -Resume lisinopril, tolerating p.o., IV fluids discontinued  Acute hypoxic respiratory failure secondary to COVID 19, resolved - Resolved - on RA at rest - Patient vaccinated - continue remdesivir/steroids - No labs performed in ED therefore obtain all labs now  Lactic acidosis In setting of above dehydration, hypoxemia, hyperglycemia Downtrending appropriately, no longer following  Bipolar, disorder Initially holding all home meds given AME hydroxyzine 25 every 8, Depakote 500 twice daily, gabapentin 300 twice daily Restart patient's Depakote and Lexapro on 09/09/2020 if mental status continues to improve -continue to hold hydroxyzine and gabapentin given concern for polypharmacy above  History of drug abuse, alcohol abuse and tobacco abuse -UDS negative as above, continue to follow closely  DVT prophylaxis: Heparin Code Status: Full Family Communication: Spoke to mother at length  Status is: Inpt  Dispo: The patient is from: Home              Anticipated d/c is to: TBD              Anticipated d/c date is: 72+hr              Patient currently NOT medically stable for discharge  Consultants:   None  Procedures:   None  Antimicrobials:  Remdesivir   Subjective: No acute issues or events overnight, much more awake alert, only oriented to person and place but otherwise denies nausea, vomiting, diarrhea, constipation, headache or chills.  Objective: Vitals:   09/08/20 0500 09/08/20 0510 09/08/20 0551 09/08/20 0600  BP:  (!) 177/91 (!) 195/87 (!) 183/82  Pulse: 66 63  67  Resp:    17  Temp:      TempSrc:      SpO2: 99% 96%  98%  Weight:      Height:        Intake/Output Summary (Last 24 hours) at 09/08/2020 0733 Last data filed  at 09/08/2020 0400 Gross per 24 hour  Intake 2251.53 ml  Output 800 ml  Net 1451.53 ml   Filed Weights   09/06/20 0935 09/07/20 1800  Weight: 90.7 kg 87.9 kg    Examination:  General:  Pleasantly resting in bed, No acute distress. HEENT:  Normocephalic atraumatic.  Sclerae nonicteric, noninjected.  Extraocular movements intact bilaterally. Neck:  Without mass or deformity.  Trachea is midline. Lungs:  Clear to auscultate bilaterally without rhonchi, wheeze, or rales. Heart:  Regular rate and rhythm.  Without murmurs, rubs, or gallops. Abdomen:  Soft, nontender, nondistended.  Without guarding or rebound. Extremities: Without cyanosis, clubbing, edema, or obvious deformity. Vascular:  Dorsalis pedis and posterior tibial pulses palpable bilaterally. Skin:  Warm and dry, no erythema, no ulcerations.   Data Reviewed: I have personally reviewed following labs and imaging studies  CBC: Recent Labs  Lab 09/06/20 1031 09/07/20 0616 09/08/20 0236  WBC 8.5 13.9* 23.0*  NEUTROABS  --  10.9* 17.8*  HGB 15.9* 15.2* 15.0  HCT 51.5* 46.3* 47.5*  MCV 92.6 85.6 88.5  PLT 256 201 212   Basic Metabolic Panel: Recent Labs  Lab 09/06/20 1900 09/06/20 1948 09/07/20 0029 09/07/20 0616 09/08/20 0236  NA 150* 152* 153* 155* 155*  K 4.0 4.4 4.3 4.0 3.6  CL 107 109 112* 111 113*  CO2 26 28 31  33* 31  GLUCOSE 530* 409* 226* 173* 149*  BUN 49* 47* 43* 41* 32*  CREATININE 2.13* 2.00* 1.65* 1.44* 1.14*  CALCIUM 9.6 9.3 9.5 9.5 9.0   GFR: Estimated Creatinine Clearance: 62.5 mL/min (A) (by C-G formula based on SCr of 1.14 mg/dL (H)). Liver Function Tests: Recent Labs  Lab 09/07/20 0616 09/08/20 0236  AST 21 46*  ALT 14 23  ALKPHOS 67 57  BILITOT 0.3 0.5  PROT 7.2 6.5  ALBUMIN 3.6 3.1*   No results for input(s): LIPASE, AMYLASE in the last 168 hours. No results for input(s): AMMONIA in the last 168 hours. Coagulation Profile: No results for input(s): INR, PROTIME in the last 168  hours. Cardiac Enzymes: Recent Labs  Lab 09/06/20 1229  CKTOTAL 69   BNP (last 3 results) No results for input(s): PROBNP in the last 8760 hours. HbA1C: Recent Labs    09/06/20 1234  HGBA1C 11.8*   CBG: Recent Labs  Lab 09/08/20 0207 09/08/20 0413 09/08/20 0512 09/08/20 0559 09/08/20 0652  GLUCAP 141* 124* 147* 154* 174*   Lipid Profile: No results for input(s): CHOL, HDL, LDLCALC, TRIG, CHOLHDL, LDLDIRECT in the last 72 hours. Thyroid Function Tests: No results for input(s): TSH, T4TOTAL, FREET4, T3FREE, THYROIDAB in the last 72 hours. Anemia Panel: No results for input(s): VITAMINB12, FOLATE, FERRITIN, TIBC, IRON, RETICCTPCT in the last 72 hours. Sepsis Labs: Recent Labs  Lab 09/06/20 1228 09/06/20 1428 09/06/20 1900  PROCALCITON  --   --  0.48  LATICACIDVEN 7.8* 2.8*  --     Recent Results (from the past 240 hour(s))  Resp Panel by RT-PCR (Flu A&B, Covid) Nasopharyngeal Swab     Status: Abnormal   Collection Time: 09/06/20 12:27 PM   Specimen: Nasopharyngeal Swab; Nasopharyngeal(NP) swabs in vial transport medium  Result  Value Ref Range Status   SARS Coronavirus 2 by RT PCR POSITIVE (A) NEGATIVE Final    Comment: RESULT CALLED TO, READ BACK BY AND VERIFIED WITH: S.LEONARD AT 1620 ON 09/06/20 BY N.THOMPSON (NOTE) SARS-CoV-2 target nucleic acids are DETECTED.  The SARS-CoV-2 RNA is generally detectable in upper respiratory specimens during the acute phase of infection. Positive results are indicative of the presence of the identified virus, but do not rule out bacterial infection or co-infection with other pathogens not detected by the test. Clinical correlation with patient history and other diagnostic information is necessary to determine patient infection status. The expected result is Negative.  Fact Sheet for Patients: BloggerCourse.com  Fact Sheet for Healthcare Providers: SeriousBroker.it  This  test is not yet approved or cleared by the [place] FDA and  has been authorized for detection and/or diagnosis of SARS-CoV-2 by FDA under an Emergency Use Authorization (EUA).  This EUA will remain in effect (meaning this t est can be used) for the duration of  the COVID-19 declaration under Section 564(b)(1) of the Act, 21 U.S.C. section 360bbb-3(b)(1), unless the authorization is terminated or revoked sooner.     Influenza A by PCR NEGATIVE NEGATIVE Final   Influenza B by PCR NEGATIVE NEGATIVE Final    Comment: (NOTE) The Xpert Xpress SARS-CoV-2/FLU/RSV plus assay is intended as an aid in the diagnosis of influenza from Nasopharyngeal swab specimens and should not be used as a sole basis for treatment. Nasal washings and aspirates are unacceptable for Xpert Xpress SARS-CoV-2/FLU/RSV testing.  Fact Sheet for Patients: BloggerCourse.com  Fact Sheet for Healthcare Providers: SeriousBroker.it  This test is not yet approved or cleared by the [place] FDA and has been authorized for detection and/or diagnosis of SARS-CoV-2 by FDA under an Emergency Use Authorization (EUA). This EUA will remain in effect (meaning this test can be used) for the duration of the COVID-19 declaration under Section 564(b)(1) of the Act, 21 U.S.C. section 360bbb-3(b)(1), unless the authorization is terminated or revoked.  Performed at [facility], [address] [place], [place] [address]   MRSA PCR Screening     Status: None   Collection Time: 09/07/20  6:04 PM   Specimen: Nasal Mucosa; Nasopharyngeal  Result Value Ref Range Status   MRSA by PCR NEGATIVE NEGATIVE Final    Comment:        The GeneXpert MRSA Assay (FDA approved for NASAL specimens only), is one component of a comprehensive MRSA colonization surveillance program. It is not intended to diagnose MRSA infection nor to guide or monitor treatment for MRSA  infections. Performed at [facility], [address], [place], [place] [address]       Radiology Studies: CT Head Wo Contrast  Result Date: 09/06/2020 CLINICAL DATA:  Altered mental status. EXAM: CT HEAD WITHOUT CONTRAST TECHNIQUE: Contiguous axial images were obtained from the base of the skull through the vertex without intravenous contrast. COMPARISON:  Head CT dated 05/14/2004 FINDINGS: Brain: Generalized parenchymal volume loss with commensurate dilatation of the ventricles and sulci, moderate to severe for age. Mild chronic small vessel ischemic changes of the bilateral deep periventricular white matter regions. No mass, hemorrhage, edema or other evidence of acute parenchymal abnormality. No extra-axial hemorrhage. Vascular: Chronic calcified atherosclerotic changes of the large vessels at the skull base. No unexpected hyperdense vessel. Skull: Normal. Negative for fracture or focal lesion. Sinuses/Orbits: No acute finding. Other: None. IMPRESSION: 1. No acute findings. No intracranial mass, hemorrhage or edema. 2.  Atrophy and chronic small vessel ischemic changes in the white matter. Electronically Signed   By: Bary Richard M.D.   On: 09/06/2020 14:24   DG Chest Portable 1 View  Result Date: 09/06/2020 CLINICAL DATA:  Shortness of breath. EXAM: PORTABLE CHEST 1 VIEW COMPARISON:  December 12, 2017. FINDINGS: The heart size and mediastinal contours are within normal limits. Both lungs are clear. No pneumothorax or pleural effusion is noted. The visualized skeletal structures are unremarkable. IMPRESSION: No active disease. Electronically Signed   By: Lupita Raider M.D.   On: 09/06/2020 12:43    Scheduled Meds: . chlorhexidine  15 mL Mouth Rinse BID  . Chlorhexidine Gluconate Cloth  6 each Topical Daily  . dexamethasone (DECADRON) injection  6 mg Intravenous Daily  . folic acid  1 mg Oral Daily  . heparin  5,000 Units Subcutaneous Q8H  . insulin glargine  22 Units  Subcutaneous Daily  . mouth rinse  15 mL Mouth Rinse BID  . mouth rinse  15 mL Mouth Rinse q12n4p  . multivitamin with minerals  1 tablet Oral Daily  . thiamine  100 mg Oral Daily   Or  . thiamine  100 mg Intravenous Daily   Continuous Infusions: . remdesivir 100 mg in NS 100 mL Stopped (09/07/20 1108)     LOS: 2 days   Time spent:  Azucena Fallen, DO [facility]  If 7PM-7AM, please contact night-coverage [email or web address]  09/08/2020, 7:33 AM

## 2020-09-08 NOTE — Care Plan (Addendum)
Notified [place] Flemings NP on call for [facility] that pt remains hypertensive and potassium 3.6.  Additionally notified of endotool's prompt to transition pt from insulin gtt. Awaiting response from provider at this time for new orders.

## 2020-09-08 NOTE — Care Plan (Signed)
Pt's order for q4h BMP's has expired. Notified [place] Flemings, NP for [facility]. Orders received. RN also notified NP that endotool has alerted 3 times to transition patient to SSI. No new orders received for insulin management at this time.

## 2020-09-08 NOTE — Care Plan (Signed)
Pt remains hypertensive at 195/74. Attempted to give previously ordered metoprolol and NP X. [place] had d/c'd. NP contacted for additional PRN medication. No new orders received at this time.

## 2020-09-08 NOTE — Care Plan (Signed)
Pt hypertensive for the past 2  hours. Paged [place] Flemings, NP on call for [facility] at 2230. NP requested manual pressure. Manual pressure performed and was found to be accurate with automatic ICU monitoring equipment. [place] Flemings notified of result. PRN order for metoprolol received.

## 2020-09-09 ENCOUNTER — Other Ambulatory Visit (HOSPITAL_COMMUNITY): Payer: Self-pay | Admitting: Internal Medicine

## 2020-09-09 DIAGNOSIS — E1111 Type 2 diabetes mellitus with ketoacidosis with coma: Secondary | ICD-10-CM

## 2020-09-09 DIAGNOSIS — E1165 Type 2 diabetes mellitus with hyperglycemia: Principal | ICD-10-CM

## 2020-09-09 DIAGNOSIS — I10 Essential (primary) hypertension: Secondary | ICD-10-CM

## 2020-09-09 DIAGNOSIS — R739 Hyperglycemia, unspecified: Secondary | ICD-10-CM

## 2020-09-09 DIAGNOSIS — R4182 Altered mental status, unspecified: Secondary | ICD-10-CM

## 2020-09-09 DIAGNOSIS — E1100 Type 2 diabetes mellitus with hyperosmolarity without nonketotic hyperglycemic-hyperosmolar coma (NKHHC): Secondary | ICD-10-CM

## 2020-09-09 DIAGNOSIS — U071 COVID-19: Principal | ICD-10-CM

## 2020-09-09 DIAGNOSIS — N179 Acute kidney failure, unspecified: Secondary | ICD-10-CM

## 2020-09-09 DIAGNOSIS — E875 Hyperkalemia: Secondary | ICD-10-CM

## 2020-09-09 LAB — GLUCOSE, CAPILLARY
Glucose-Capillary: 189 mg/dL — ABNORMAL HIGH (ref 70–99)
Glucose-Capillary: 260 mg/dL — ABNORMAL HIGH (ref 70–99)
Glucose-Capillary: 220 mg/dL — ABNORMAL HIGH (ref 70–99)
Glucose-Capillary: 244 mg/dL — ABNORMAL HIGH (ref 70–99)

## 2020-09-09 MED ORDER — LISINOPRIL 20 MG PO TABS: 20.0000 mg | ORAL_TABLET | Freq: Every day | ORAL | 0 refills | Status: DC

## 2020-09-09 NOTE — Discharge Instructions (Signed)
10 Things You Can Do to Manage Your COVID-19 Symptoms at Home If you have possible or confirmed COVID-19: 1. Stay home from work and school. And stay away from other public places. If you must go out, avoid using any kind of public transportation, ridesharing, or taxis. 2. Monitor your symptoms carefully. If your symptoms get worse, call your healthcare provider immediately. 3. Get rest and stay hydrated. 4. If you have a medical appointment, call the healthcare provider ahead of time and tell them that you have or may have COVID-19. 5. For medical emergencies, call 911 and notify the dispatch personnel that you have or may have COVID-19. 6. Cover your cough and sneezes with a tissue or use the inside of your elbow. 7. Wash your hands often with soap and water for at least 20 seconds or clean your hands with an alcohol-based hand sanitizer that contains at least 60% alcohol. 8. As much as possible, stay in a specific room and away from other people in your home. Also, you should use a separate bathroom, if available. If you need to be around other people in or outside of the home, wear a mask. 9. Avoid sharing personal items with other people in your household, like dishes, towels, and bedding. 10. Clean all surfaces that are touched often, like counters, tabletops, and doorknobs. Use household cleaning sprays or wipes according to the label instructions. cdc.gov/coronavirus 03/08/2019 This information is not intended to replace advice given to you by your health care provider. Make sure you discuss any questions you have with your health care provider. Document Revised: 08/10/2019 Document Reviewed: 08/10/2019 Elsevier Patient Education  2020 Elsevier Inc.  

## 2020-09-09 NOTE — Discharge Summary (Signed)
Physician Discharge Summary  Carolyn Norton EHU:[id number] DOB: 07/11/1966 DOA: 09/06/2020  PCP: [name], NP  Admit date: 09/06/2020 Discharge date: 09/09/2020  Admitted From: Home Disposition: Home  Recommendations for Outpatient Follow-up:  1. Follow up with PCP in 1-2 weeks 2. Please obtain BMP/CBC in one week 3. Please follow up on the following pending results:  Home Health: None Equipment/Devices: None  Discharge Condition: Stable CODE STATUS: Full Diet recommendation: Diabetic diet as tolerated  Brief/Interim Summary: 55 year old white female prior breast abscess diabetic foot ulcer treated in the past 07/26/2019 wound care. Reactive airway disease depression on medications type 2 diabetes mellitus reflux and diabetic neuropathy. Presented from home altered state. Cannot obtain ROS--h/o taken from record, discussion with ED-Pa and talked to mother. Mother relates hadn't eaten all week-had some milk shakes, no solids--sicker Monday and wasn't getting better--wasn't coherent. 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 She has Covid--some visitors came over [place] and they apparently had Covid. Patients had both Covid vaccines.  Patient admitted as above with acute metabolic encephalopathy, acute hypoxia in the setting of COVID-19 pneumonia, concern for polypharmacy or substance abuse and withdrawal was also noted per discussion with family at intake given no history of psychiatric illness and illicit substance use and abuse.  UDS unremarkable, unclear if patient was initially having withdrawals or polypharmacy from home medications, over the past 48 hours with medication holiday and supportive care patient's mental status improved drastically.  Given patient's vaccination status she resolved quite quickly from her acute hypoxia in the setting of COVID-19 pneumonia as well.  Given rapid improvement no longer requiring  oxygen at rest or with exertion patient will be discontinued off any further medications.  Close follow-up with PCP in the next 1 to 2 weeks as discussed, will need to continue quarantining for at least an additional 2 weeks after discharge.  Patient otherwise stable and agreeable for discharge.  Discharge Diagnoses:  Active Problems:   DKA (diabetic ketoacidosis) ([place])   COVID-19 virus infection-probably omicron without respiratory symptoms   Bipolar 1 disorder ([facility])   Breast mass status post excision   Smoker   Lactic acidosis on admission probably noninfectious    Discharge Instructions  Discharge Instructions    Call MD for:  difficulty breathing, headache or visual disturbances   Complete by: As directed    Call MD for:  temperature >100.4   Complete by: As directed    Diet Carb Modified   Complete by: As directed    Increase activity slowly   Complete by: As directed    No wound care   Complete by: As directed      Allergies as of 09/09/2020      Reactions   Erythromycin Other (See Comments)   Childhood reaction      Medication List    STOP taking these medications   pantoprazole 40 MG tablet Commonly known as: PROTONIX     TAKE these medications   albuterol 108 (90 Base) MCG/ACT inhaler Commonly known as: VENTOLIN HFA Inhale 2 puffs into the lungs every 4 (four) hours as needed for wheezing or shortness of breath.   albuterol (2.5 MG/3ML) 0.083% nebulizer solution Commonly known as: PROVENTIL Take 3 mLs (2.5 mg total) by nebulization every 6 (six) hours as needed for wheezing or shortness of breath.   atorvastatin 20 MG tablet Commonly known as: LIPITOR Take 1 tablet (20 mg total) by mouth daily.   Dexilant  30 MG capsule Generic drug: Dexlansoprazole Take 30 mg by mouth daily.   divalproex 500 MG DR tablet Commonly known as: DEPAKOTE Take 500 mg by mouth 2 (two) times daily.   escitalopram 20 MG tablet Commonly known as: LEXAPRO Take 1 tablet (20  mg total) by mouth daily.   gabapentin 300 MG capsule Commonly known as: NEURONTIN Take 300 mg by mouth 2 (two) times daily.   hydrOXYzine 25 MG tablet Commonly known as: ATARAX/VISTARIL Take 1 tablet (25 mg total) by mouth every 8 (eight) hours as needed for anxiety.   insulin glargine 100 UNIT/ML Solostar Pen Commonly known as: LANTUS Inject 45 Units into the skin daily at 10 pm.   lisinopril 20 MG tablet Commonly known as: ZESTRIL Take 1 tablet (20 mg total) by mouth daily. What changed:   medication strength  how much to take  when to take this   loratadine 10 MG tablet Commonly known as: CLARITIN Take 10 mg by mouth daily.   montelukast 10 MG tablet Commonly known as: SINGULAIR Take 1 tablet (10 mg total) by mouth at bedtime.   True Metrix Blood Glucose Test test strip Generic drug: glucose blood Use as instructed. Check blood glucose level by fingerstick twice per day.   True Metrix Meter w/Device Kit Use as instructed. Check blood glucose level by fingerstick twice per day.   TRUEplus Lancets 28G Misc Use as instructed. Check blood glucose level by fingerstick twice per day.       Allergies  Allergen Reactions  . Erythromycin Other (See Comments)    Childhood reaction    Consultations:  None   Procedures/Studies: CT Head Wo Contrast  Result Date: 09/06/2020 CLINICAL DATA:  Altered mental status. EXAM: CT HEAD WITHOUT CONTRAST TECHNIQUE: Contiguous axial images were obtained from the base of the skull through the vertex without intravenous contrast. COMPARISON:  Head CT dated 05/14/2004 FINDINGS: Brain: Generalized parenchymal volume loss with commensurate dilatation of the ventricles and sulci, moderate to severe for age. Mild chronic small vessel ischemic changes of the bilateral deep periventricular white matter regions. No mass, hemorrhage, edema or other evidence of acute parenchymal abnormality. No extra-axial hemorrhage. Vascular: Chronic  calcified atherosclerotic changes of the large vessels at the skull base. No unexpected hyperdense vessel. Skull: Normal. Negative for fracture or focal lesion. Sinuses/Orbits: No acute finding. Other: None. IMPRESSION: 1. No acute findings. No intracranial mass, hemorrhage or edema. 2. Atrophy and chronic small vessel ischemic changes in the white matter. Electronically Signed   By: Franki [name] M.D.   On: 09/06/2020 14:24   DG Chest Portable 1 View  Result Date: 09/06/2020 CLINICAL DATA:  Shortness of breath. EXAM: PORTABLE CHEST 1 VIEW COMPARISON:  December 12, 2017. FINDINGS: The heart size and mediastinal contours are within normal limits. Both lungs are clear. No pneumothorax or pleural effusion is noted. The visualized skeletal structures are unremarkable. IMPRESSION: No active disease. Electronically Signed   By: Marijo Conception M.D.   On: 09/06/2020 12:43      Subjective: No acute issues or events overnight, denies nausea, vomiting, diarrhea, constipation, headache, fevers, chills   Discharge Exam: Vitals:   09/09/20 0600 09/09/20 0700  BP:    Pulse: (!) 59   Resp: 20   Temp:  98.1 F (36.7 C)  SpO2: 96%    Vitals:   09/09/20 0400 09/09/20 0500 09/09/20 0600 09/09/20 0700  BP: (!) 147/89     Pulse: 64 69 (!) 59   Resp: Marland Kitchen)  21 14 20    Temp: (!) 97.5 F (36.4 C)   98.1 F (36.7 C)  TempSrc: Oral     SpO2: 99% 92% 96%   Weight:      Height:        General: Pt is alert, awake, not in acute distress Cardiovascular: RRR, S1/S2 +, no rubs, no gallops Respiratory: CTA bilaterally, no wheezing, no rhonchi Abdominal: Soft, NT, ND, bowel sounds + Extremities: no edema, no cyanosis    The results of significant diagnostics from this hospitalization (including imaging, microbiology, ancillary and laboratory) are listed below for reference.     Microbiology: Recent Results (from the past 240 hour(s))  Resp Panel by RT-PCR (Flu A&B, Covid) Nasopharyngeal Swab     Status:  Abnormal   Collection Time: 09/06/20 12:27 PM   Specimen: Nasopharyngeal Swab; Nasopharyngeal(NP) swabs in vial transport medium  Result Value Ref Range Status   SARS Coronavirus 2 by RT PCR POSITIVE (A) NEGATIVE Final    Comment: RESULT CALLED TO, READ BACK BY AND VERIFIED WITH: S.LEONARD AT 1620 ON 09/06/20 BY N.THOMPSON (NOTE) SARS-CoV-2 target nucleic acids are DETECTED.  The SARS-CoV-2 RNA is generally detectable in upper respiratory specimens during the acute phase of infection. Positive results are indicative of the presence of the identified virus, but do not rule out bacterial infection or co-infection with other pathogens not detected by the test. Clinical correlation with patient history and other diagnostic information is necessary to determine patient infection status. The expected result is Negative.  Fact Sheet for Patients: EntrepreneurPulse.com.au  Fact Sheet for Healthcare Providers: IncredibleEmployment.be  This test is not yet approved or cleared by the [place] FDA and  has been authorized for detection and/or diagnosis of SARS-CoV-2 by FDA under an Emergency Use Authorization (EUA).  This EUA will remain in effect (meaning this t est can be used) for the duration of  the COVID-19 declaration under Section 564(b)(1) of the Act, 21 U.S.C. section 360bbb-3(b)(1), unless the authorization is terminated or revoked sooner.     Influenza A by PCR NEGATIVE NEGATIVE Final   Influenza B by PCR NEGATIVE NEGATIVE Final    Comment: (NOTE) The Xpert Xpress SARS-CoV-2/FLU/RSV plus assay is intended as an aid in the diagnosis of influenza from Nasopharyngeal swab specimens and should not be used as a sole basis for treatment. Nasal washings and aspirates are unacceptable for Xpert Xpress SARS-CoV-2/FLU/RSV testing.  Fact Sheet for Patients: EntrepreneurPulse.com.au  Fact Sheet for Healthcare  Providers: IncredibleEmployment.be  This test is not yet approved or cleared by the [place] FDA and has been authorized for detection and/or diagnosis of SARS-CoV-2 by FDA under an Emergency Use Authorization (EUA). This EUA will remain in effect (meaning this test can be used) for the duration of the COVID-19 declaration under Section 564(b)(1) of the Act, 21 U.S.C. section 360bbb-3(b)(1), unless the authorization is terminated or revoked.  Performed at [facility], [place] [address] [place], [place] [address]   MRSA PCR Screening     Status: None   Collection Time: 09/07/20  6:04 PM   Specimen: Nasal Mucosa; Nasopharyngeal  Result Value Ref Range Status   MRSA by PCR NEGATIVE NEGATIVE Final    Comment:        The GeneXpert MRSA Assay (FDA approved for NASAL specimens only), is one component of a comprehensive MRSA colonization surveillance program. It is not intended to diagnose MRSA infection nor to guide or monitor treatment for MRSA infections. Performed at [facility], [place] [address] [place], [place] [address]      Labs: BNP (last 3 results) No results for input(s): BNP in the last 8760 hours. Basic Metabolic Panel: Recent Labs  Lab 09/06/20 1900 09/06/20 1948 09/07/20 0029 09/07/20 0616 09/08/20 0236  NA 150* 152* 153* 155* 155*  K 4.0 4.4 4.3 4.0 3.6  CL 107 109 112* 111 113*  CO2 26 28 31  33* 31  GLUCOSE 530* 409* 226* 173* 149*  BUN 49* 47* 43* 41* 32*  CREATININE 2.13* 2.00* 1.65* 1.44* 1.14*  CALCIUM 9.6 9.3 9.5 9.5 9.0   Liver Function Tests: Recent Labs  Lab 09/07/20 0616 09/08/20 0236  AST 21 46*  ALT 14 23  ALKPHOS 67 57  BILITOT 0.3 0.5  PROT 7.2 6.5  ALBUMIN 3.6 3.1*   No results for input(s): LIPASE, AMYLASE in the last 168 hours. No results for input(s): AMMONIA in the last 168 hours. CBC: Recent Labs  Lab 09/06/20 1031 09/07/20 0616 09/08/20 0236  WBC 8.5 13.9*  23.0*  NEUTROABS  --  10.9* 17.8*  HGB 15.9* 15.2* 15.0  HCT 51.5* 46.3* 47.5*  MCV 92.6 85.6 88.5  PLT 256 201 212   Cardiac Enzymes: Recent Labs  Lab 09/06/20 1229  CKTOTAL 69   BNP: Invalid input(s): POCBNP CBG: Recent Labs  Lab 09/08/20 0819 09/08/20 1214 09/08/20 1635 09/08/20 2109 09/09/20 0824  GLUCAP 164* 204* 260* 254* 220*   D-Dimer Recent Labs    09/07/20 0616 09/08/20 0236  DDIMER <0.27 <0.27   Hgb A1c Recent Labs    09/06/20 1234  HGBA1C 11.8*   Lipid Profile No results for input(s): CHOL, HDL, LDLCALC, TRIG, CHOLHDL, LDLDIRECT in the last 72 hours. Thyroid function studies No results for input(s): TSH, T4TOTAL, T3FREE, THYROIDAB in the last 72 hours.  Invalid input(s): FREET3 Anemia work up No results for input(s): VITAMINB12, FOLATE, FERRITIN, TIBC, IRON, RETICCTPCT in the last 72 hours. Urinalysis    Component Value Date/Time   COLORURINE STRAW (A) 09/06/2020 1031   APPEARANCEUR CLEAR 09/06/2020 1031   LABSPEC 1.026 09/06/2020 1031   PHURINE 5.0 09/06/2020 1031   GLUCOSEU >=500 (A) 09/06/2020 1031   HGBUR SMALL (A) 09/06/2020 1031   BILIRUBINUR NEGATIVE 09/06/2020 1031   KETONESUR 20 (A) 09/06/2020 1031   PROTEINUR NEGATIVE 09/06/2020 1031   UROBILINOGEN 0.2 07/20/2014 2022   NITRITE NEGATIVE 09/06/2020 1031   LEUKOCYTESUR NEGATIVE 09/06/2020 1031   Sepsis Labs Invalid input(s): PROCALCITONIN,  WBC,  LACTICIDVEN Microbiology Recent Results (from the past 240 hour(s))  Resp Panel by RT-PCR (Flu A&B, Covid) Nasopharyngeal Swab     Status: Abnormal   Collection Time: 09/06/20 12:27 PM   Specimen: Nasopharyngeal Swab; Nasopharyngeal(NP) swabs in vial transport medium  Result Value Ref Range Status   SARS Coronavirus 2 by RT PCR POSITIVE (A) NEGATIVE Final    Comment: RESULT CALLED TO, READ BACK BY AND VERIFIED WITH: S.LEONARD AT 1620 ON 09/06/20 BY N.THOMPSON (NOTE) SARS-CoV-2 target nucleic acids are DETECTED.  The SARS-CoV-2  RNA is generally detectable in upper respiratory specimens during the acute phase of infection. Positive results are indicative of the presence of the identified virus, but do not rule out bacterial infection or co-infection with other pathogens not detected by the test. Clinical correlation with patient history and other diagnostic information is necessary to determine patient infection status. The expected result is Negative.  Fact Sheet for Patients: EntrepreneurPulse.com.au  Fact Sheet for Healthcare Providers: IncredibleEmployment.be  This test is not  yet approved or cleared by the [place] and  has been authorized for detection and/or diagnosis of SARS-CoV-2 by FDA under an Emergency Use Authorization (EUA).  This EUA will remain in effect (meaning this t est can be used) for the duration of  the COVID-19 declaration under Section 564(b)(1) of the Act, 21 U.S.C. section 360bbb-3(b)(1), unless the authorization is terminated or revoked sooner.     Influenza A by PCR NEGATIVE NEGATIVE Final   Influenza B by PCR NEGATIVE NEGATIVE Final    Comment: (NOTE) The Xpert Xpress SARS-CoV-2/FLU/RSV plus assay is intended as an aid in the diagnosis of influenza from Nasopharyngeal swab specimens and should not be used as a sole basis for treatment. Nasal washings and aspirates are unacceptable for Xpert Xpress SARS-CoV-2/FLU/RSV testing.  Fact Sheet for Patients: EntrepreneurPulse.com.au  Fact Sheet for Healthcare Providers: IncredibleEmployment.be  This test is not yet approved or cleared by the [place] FDA and has been authorized for detection and/or diagnosis of SARS-CoV-2 by FDA under an Emergency Use Authorization (EUA). This EUA will remain in effect (meaning this test can be used) for the duration of the COVID-19 declaration under Section 564(b)(1) of the Act, 21 U.S.C. section  360bbb-3(b)(1), unless the authorization is terminated or revoked.  Performed at [facility], [place] [address], [place], [place] [address]   MRSA PCR Screening     Status: None   Collection Time: 09/07/20  6:04 PM   Specimen: Nasal Mucosa; Nasopharyngeal  Result Value Ref Range Status   MRSA by PCR NEGATIVE NEGATIVE Final    Comment:        The GeneXpert MRSA Assay (FDA approved for NASAL specimens only), is one component of a comprehensive MRSA colonization surveillance program. It is not intended to diagnose MRSA infection nor to guide or monitor treatment for MRSA infections. Performed at [facility], [place] [address], [place], [place] [address]      Time coordinating discharge: Over 30 minutes  SIGNED:   Little Ishikawa, DO [facility] 09/09/2020, 12:03 PM Pager   If 7PM-7AM, please contact night-coverage [email or web address]

## 2020-09-09 NOTE — Progress Notes (Signed)
Pt is being noncompliant and impulsive while awaiting discharge. Patient has removed an IV and removed all monitoring devices. Discussed about patience while getting discharge paperwork complete.

## 2020-09-09 NOTE — Progress Notes (Signed)
Inpatient Diabetes Program Recommendations  AACE/ADA: New Consensus Statement on Inpatient Glycemic Control (2015)  Target Ranges:  Prepandial:   less than 140 mg/dL      Peak postprandial:   less than 180 mg/dL (1-2 hours)      Critically ill patients:  140 - 180 mg/dL   Results for Carolyn Norton, Carolyn Norton (MRN [id number]) as of 09/09/2020 09:00  Ref. Range 09/08/2020 01:07 09/08/2020 02:07 09/08/2020 04:13 09/08/2020 05:12 09/08/2020 05:59 09/08/2020 06:52 09/08/2020 08:19 09/08/2020 12:14 09/08/2020 21:09  Glucose-Capillary Latest Ref Range: 70 - 99 mg/dL 250 (H)  IV Insulin Drip 141 (H)  IV Insulin Drip 124 (H)  IV Insulin Drip 147 (H) 154 (H)  IV Insulin Drip  22 units LANTUS 174 (H)  IV Insulin Drip 164 (H)  IV Insulin Drip Stopped at 8:30am 204 (H)  7 units NOVOLOG  254 (H)  3 units NOVOLOG  45 units LANTUS   Results for Carolyn Norton, Carolyn Norton (MRN [id number]) as of 09/09/2020 09:00  Ref. Range 09/09/2020 08:24  Glucose-Capillary Latest Ref Range: 70 - 99 mg/dL 937 (H)    Home DM Meds: Lantus 45 units QHS  Current Orders: Lantus 45 units QHS      Novolog Resistant Correction Scale/ SSI (0-20 units) TID AC + HS    Decadron 6 mg Daily  Note patient transitioned to SQ Insulin early yesterday AM   MD- Please consider the following in-hospital insulin adjustments:  1. Increase Lantus to 50 units QHS (CBG was 220 this AM)  2. Start Novolog Meal Coverage: Novolog 4 units TID with meals  (Please add the following Hold Parameters: Hold if pt eats <50% of meal, Hold if pt NPO)     --Will follow patient during hospitalization--  Ambrose [place] RN, MSN, CDE Diabetes Coordinator Inpatient Glycemic Control Team Team Pager: [phone] (8a-5p)

## 2020-09-10 ENCOUNTER — Ambulatory Visit: Payer: Self-pay | Admitting: Nurse Practitioner

## 2020-09-10 ENCOUNTER — Other Ambulatory Visit: Payer: Self-pay

## 2020-09-10 ENCOUNTER — Telehealth: Payer: Self-pay

## 2020-09-10 NOTE — Telephone Encounter (Signed)
Transition Care Management Unsuccessful Follow-up Telephone Call  Date of discharge and from where:  09/09/2020, [facility]   Attempts:  1st Attempt  Reason for unsuccessful TCM follow-up call:  Left voice message, call placed to # [phone].  Patient has appointment scheduled with Ms Meredeth Ide, NP 10/07/2020.

## 2020-09-11 ENCOUNTER — Telehealth: Payer: Self-pay

## 2020-09-11 NOTE — Telephone Encounter (Signed)
Patient called back for Carolyn Norton to give her a call back at Ph# [phone]
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
## 2020-09-11 NOTE — Telephone Encounter (Signed)
Transition Care Management Unsuccessful Follow-up Telephone Call  Date of discharge and from where:  09/09/2020, [facility]   Attempts:  2nd Attempt  Reason for unsuccessful TCM follow-up call:  Left voice message, call placed to # [phone]  Patient has appointment with Bertram Denver, NP  10/07/2020.

## 2020-09-13 ENCOUNTER — Other Ambulatory Visit: Payer: Self-pay | Admitting: Nurse Practitioner

## 2020-09-13 DIAGNOSIS — E119 Type 2 diabetes mellitus without complications: Secondary | ICD-10-CM

## 2020-09-13 MED ORDER — LORATADINE 10 MG PO TABS: 10.0000 mg | ORAL_TABLET | Freq: Every day | ORAL | 1 refills | Status: DC

## 2020-09-13 MED ORDER — ALBUTEROL SULFATE (2.5 MG/3ML) 0.083% IN NEBU
2.5000 mg | INHALATION_SOLUTION | Freq: Four times a day (QID) | RESPIRATORY_TRACT | 12 refills | Status: DC | PRN
Start: 2020-09-13 — End: 2020-09-13

## 2020-09-13 MED FILL — ALBUTEROL 0.083% INHAL SOLN: (2.5 MG/3ML | 6 days supply | Qty: 75 | Fill #0

## 2020-09-18 ENCOUNTER — Ambulatory Visit: Payer: Self-pay | Admitting: Nurse Practitioner

## 2020-09-18 ENCOUNTER — Other Ambulatory Visit: Payer: Self-pay

## 2020-09-24 ENCOUNTER — Other Ambulatory Visit: Payer: Self-pay | Admitting: Urology

## 2020-09-24 MED FILL — ALBUTEROL 0.083% INHAL SOLN: (2.5 MG/3ML | 6 days supply | Qty: 75 | Fill #0

## 2020-09-24 MED FILL — !LANTUS SOLOSTAR 100UNITS/M: 100 | 26 days supply | Qty: 12 | Fill #0

## 2020-09-24 MED FILL — LISINOPRIL 20 MG TABLET: 20 | 30 days supply | Qty: 30 | Fill #0

## 2020-10-07 ENCOUNTER — Ambulatory Visit: Payer: No Typology Code available for payment source | Admitting: Nurse Practitioner

## 2020-10-22 ENCOUNTER — Emergency Department (HOSPITAL_COMMUNITY): Payer: Self-pay

## 2020-10-22 ENCOUNTER — Encounter (HOSPITAL_COMMUNITY): Payer: Self-pay | Admitting: Emergency Medicine

## 2020-10-22 ENCOUNTER — Inpatient Hospital Stay (HOSPITAL_COMMUNITY)
Admission: EM | Admit: 2020-10-22 | Discharge: 2020-10-27 | DRG: 917 | Disposition: A | Discharge status: Home Health | Payer: Self-pay | Attending: Student | Admitting: Student

## 2020-10-22 DIAGNOSIS — F319 Bipolar disorder, unspecified: Secondary | ICD-10-CM | POA: Diagnosis present

## 2020-10-22 DIAGNOSIS — I952 Hypotension due to drugs: Secondary | ICD-10-CM

## 2020-10-22 DIAGNOSIS — E1165 Type 2 diabetes mellitus with hyperglycemia: Secondary | ICD-10-CM

## 2020-10-22 DIAGNOSIS — I959 Hypotension, unspecified: Secondary | ICD-10-CM

## 2020-10-22 DIAGNOSIS — R410 Disorientation, unspecified: Secondary | ICD-10-CM

## 2020-10-22 DIAGNOSIS — N179 Acute kidney failure, unspecified: Secondary | ICD-10-CM

## 2020-10-22 DIAGNOSIS — Z9114 Patient's other noncompliance with medication regimen: Secondary | ICD-10-CM

## 2020-10-22 DIAGNOSIS — E11649 Type 2 diabetes mellitus with hypoglycemia without coma: Secondary | ICD-10-CM | POA: Diagnosis not present

## 2020-10-22 DIAGNOSIS — E669 Obesity, unspecified: Secondary | ICD-10-CM | POA: Diagnosis present

## 2020-10-22 DIAGNOSIS — G928 Other toxic encephalopathy: Secondary | ICD-10-CM | POA: Diagnosis present

## 2020-10-22 DIAGNOSIS — E1122 Type 2 diabetes mellitus with diabetic chronic kidney disease: Secondary | ICD-10-CM | POA: Diagnosis present

## 2020-10-22 DIAGNOSIS — G9341 Metabolic encephalopathy: Secondary | ICD-10-CM | POA: Diagnosis present

## 2020-10-22 DIAGNOSIS — I10 Essential (primary) hypertension: Secondary | ICD-10-CM

## 2020-10-22 DIAGNOSIS — E1169 Type 2 diabetes mellitus with other specified complication: Secondary | ICD-10-CM

## 2020-10-22 DIAGNOSIS — IMO0002 Reserved for concepts with insufficient information to code with codable children: Secondary | ICD-10-CM

## 2020-10-22 DIAGNOSIS — E785 Hyperlipidemia, unspecified: Secondary | ICD-10-CM | POA: Diagnosis present

## 2020-10-22 DIAGNOSIS — N1831 Chronic kidney disease, stage 3a: Secondary | ICD-10-CM | POA: Diagnosis present

## 2020-10-22 DIAGNOSIS — Z794 Long term (current) use of insulin: Secondary | ICD-10-CM

## 2020-10-22 DIAGNOSIS — I129 Hypertensive chronic kidney disease with stage 1 through stage 4 chronic kidney disease, or unspecified chronic kidney disease: Secondary | ICD-10-CM | POA: Diagnosis present

## 2020-10-22 DIAGNOSIS — E860 Dehydration: Secondary | ICD-10-CM | POA: Diagnosis present

## 2020-10-22 DIAGNOSIS — Z8616 Personal history of COVID-19: Secondary | ICD-10-CM

## 2020-10-22 DIAGNOSIS — U071 COVID-19: Secondary | ICD-10-CM | POA: Diagnosis present

## 2020-10-22 DIAGNOSIS — E119 Type 2 diabetes mellitus without complications: Secondary | ICD-10-CM

## 2020-10-22 DIAGNOSIS — R5381 Other malaise: Secondary | ICD-10-CM | POA: Diagnosis present

## 2020-10-22 DIAGNOSIS — D72829 Elevated white blood cell count, unspecified: Secondary | ICD-10-CM

## 2020-10-22 DIAGNOSIS — E875 Hyperkalemia: Secondary | ICD-10-CM | POA: Diagnosis present

## 2020-10-22 DIAGNOSIS — E872 Acidosis: Secondary | ICD-10-CM

## 2020-10-22 DIAGNOSIS — Z6830 Body mass index (BMI) 30.0-30.9, adult: Secondary | ICD-10-CM

## 2020-10-22 DIAGNOSIS — F17210 Nicotine dependence, cigarettes, uncomplicated: Secondary | ICD-10-CM | POA: Diagnosis present

## 2020-10-22 DIAGNOSIS — T405X1A Poisoning by cocaine, accidental (unintentional), initial encounter: Principal | ICD-10-CM | POA: Diagnosis present

## 2020-10-22 DIAGNOSIS — F1410 Cocaine abuse, uncomplicated: Secondary | ICD-10-CM | POA: Diagnosis present

## 2020-10-22 DIAGNOSIS — Z79899 Other long term (current) drug therapy: Secondary | ICD-10-CM

## 2020-10-22 DIAGNOSIS — F419 Anxiety disorder, unspecified: Secondary | ICD-10-CM

## 2020-10-22 DIAGNOSIS — N170 Acute kidney failure with tubular necrosis: Secondary | ICD-10-CM | POA: Diagnosis present

## 2020-10-22 LAB — COMPREHENSIVE METABOLIC PANEL
ALT: 12 U/L (ref 0–44)
AST: 16 U/L (ref 15–41)
Albumin: 4.2 g/dL (ref 3.5–5.0)
Alkaline Phosphatase: 69 U/L (ref 38–126)
Anion gap: 16 — ABNORMAL HIGH (ref 5–15)
BUN: 46 mg/dL — ABNORMAL HIGH (ref 6–20)
CO2: 27 mmol/L (ref 22–32)
Calcium: 10.3 mg/dL (ref 8.9–10.3)
Chloride: 96 mmol/L — ABNORMAL LOW (ref 98–111)
Creatinine, Ser: 3.42 mg/dL — ABNORMAL HIGH (ref 0.44–1.00)
GFR, Estimated: 15 mL/min — ABNORMAL LOW (ref >60)
Glucose, Bld: 123 mg/dL — ABNORMAL HIGH (ref 70–99)
Potassium: 4 mmol/L (ref 3.5–5.1)
Sodium: 139 mmol/L (ref 135–145)
Total Bilirubin: 0.6 mg/dL (ref 0.3–1.2)
Total Protein: 8 g/dL (ref 6.5–8.1)

## 2020-10-22 LAB — BLOOD GAS, VENOUS
Acid-Base Excess: 3.7 mmol/L — ABNORMAL HIGH (ref 0.0–2.0)
Bicarbonate: 28.6 mmol/L — ABNORMAL HIGH (ref 20.0–28.0)
O2 Saturation: 79.9 %
Patient temperature: 98.6
pCO2, Ven: 46.4 mmHg (ref 44.0–60.0)
pH, Ven: 7.407 (ref 7.250–7.430)
pO2, Ven: 45.7 mmHg — ABNORMAL HIGH (ref 32.0–45.0)

## 2020-10-22 LAB — CBC WITH DIFFERENTIAL/PLATELET
Abs Immature Granulocytes: 0 10*3/uL (ref 0.00–0.07)
Band Neutrophils: 0 %
Basophils Absolute: 0.2 10*3/uL — ABNORMAL HIGH (ref 0.0–0.1)
Basophils Relative: 1 %
Blasts: 0 %
Eosinophils Absolute: 0 10*3/uL (ref 0.0–0.5)
Eosinophils Relative: 0 %
HCT: 38.2 % (ref 36.0–46.0)
Hemoglobin: 13 g/dL (ref 12.0–15.0)
Lymphocytes Relative: 18 %
Lymphs Abs: 3.4 10*3/uL (ref 0.7–4.0)
MCH: 28.3 pg (ref 26.0–34.0)
MCHC: 34 g/dL (ref 30.0–36.0)
MCV: 83.2 fL (ref 80.0–100.0)
Metamyelocytes Relative: 0 %
Monocytes Absolute: 3 10*3/uL — ABNORMAL HIGH (ref 0.1–1.0)
Monocytes Relative: 16 %
Myelocytes: 0 %
Neutro Abs: 12.1 10*3/uL — ABNORMAL HIGH (ref 1.7–7.7)
Neutrophils Relative %: 65 %
Other: 0 %
Platelets: 229 10*3/uL (ref 150–400)
Promyelocytes Relative: 0 %
RBC: 4.59 MIL/uL (ref 3.87–5.11)
RDW: 13.5 % (ref 11.5–15.5)
WBC: 18.7 10*3/uL — ABNORMAL HIGH (ref 4.0–10.5)
nRBC: 0 % (ref 0.0–0.2)
nRBC: 0 /100 WBC

## 2020-10-22 LAB — CBG MONITORING, ED
Glucose-Capillary: 210 mg/dL — ABNORMAL HIGH (ref 70–99)
Glucose-Capillary: 93 mg/dL (ref 70–99)

## 2020-10-22 LAB — URINALYSIS, ROUTINE W REFLEX MICROSCOPIC
Bilirubin Urine: NEGATIVE
Glucose, UA: 150 mg/dL — AB
Ketones, ur: 5 mg/dL — AB
Leukocytes,Ua: NEGATIVE
Nitrite: NEGATIVE
Protein, ur: 30 mg/dL — AB
Specific Gravity, Urine: 1.025 (ref 1.005–1.030)
pH: 5 (ref 5.0–8.0)

## 2020-10-22 LAB — BLOOD GAS, ARTERIAL
Acid-Base Excess: 2 mmol/L (ref 0.0–2.0)
Bicarbonate: 28.7 mmol/L — ABNORMAL HIGH (ref 20.0–28.0)
Drawn by: 422461
O2 Content: 2 L/min
O2 Saturation: 93.1 %
Patient temperature: 97.4
pCO2 arterial: 56.8 mmHg — ABNORMAL HIGH (ref 32.0–48.0)
pH, Arterial: 7.324 — ABNORMAL LOW (ref 7.350–7.450)
pO2, Arterial: 74.5 mmHg — ABNORMAL LOW (ref 83.0–108.0)

## 2020-10-22 LAB — RESP PANEL BY RT-PCR (FLU A&B, COVID) ARPGX2
Influenza A by PCR: NEGATIVE
Influenza B by PCR: NEGATIVE
SARS Coronavirus 2 by RT PCR: NEGATIVE

## 2020-10-22 LAB — ETHANOL: Alcohol, Ethyl (B): <10 mg/dL (ref <10)

## 2020-10-22 LAB — ACETAMINOPHEN LEVEL: Acetaminophen (Tylenol), Serum: <10 ug/mL — ABNORMAL LOW (ref 10–30)

## 2020-10-22 LAB — LACTIC ACID, PLASMA
Lactic Acid, Venous: 2.6 mmol/L (ref 0.5–1.9)
Lactic Acid, Venous: 1.6 mmol/L (ref 0.5–1.9)

## 2020-10-22 LAB — RAPID URINE DRUG SCREEN, HOSP PERFORMED
Amphetamines: NOT DETECTED
Barbiturates: NOT DETECTED
Benzodiazepines: POSITIVE — AB
Cocaine: POSITIVE — AB
Opiates: NOT DETECTED
Tetrahydrocannabinol: NOT DETECTED

## 2020-10-22 LAB — HIV ANTIBODY (ROUTINE TESTING W REFLEX): HIV Screen 4th Generation wRfx: NONREACTIVE

## 2020-10-22 LAB — GLUCOSE, CAPILLARY
Glucose-Capillary: 31 mg/dL — CL (ref 70–99)
Glucose-Capillary: 123 mg/dL — ABNORMAL HIGH (ref 70–99)

## 2020-10-22 LAB — CK: Total CK: 303 U/L — ABNORMAL HIGH (ref 38–234)

## 2020-10-22 LAB — PROCALCITONIN: Procalcitonin: 4.95 ng/mL

## 2020-10-22 MED ORDER — SODIUM CHLORIDE 0.9 % IV BOLUS
1000.0000 mL | Freq: Once | INTRAVENOUS | Status: AC
  Administered 2020-10-22: 1000 mL via INTRAVENOUS

## 2020-10-22 MED ORDER — DEXTROSE 50 % IV SOLN
INTRAVENOUS | Status: AC
  Administered 2020-10-22: 50 mL
  Filled 2020-10-22: qty 100

## 2020-10-22 MED ORDER — VANCOMYCIN HCL IN DEXTROSE 1-5 GM/200ML-% IV SOLN: 1000.0000 mg | Freq: Once | INTRAVENOUS | Status: DC

## 2020-10-22 MED ORDER — LORAZEPAM 2 MG/ML IJ SOLN
2.0000 mg | Freq: Once | INTRAMUSCULAR | Status: AC
  Administered 2020-10-22: 2 mg via INTRAVENOUS
  Filled 2020-10-22: qty 1

## 2020-10-22 MED ORDER — DEXAMETHASONE SODIUM PHOSPHATE 10 MG/ML IJ SOLN
10.0000 mg | Freq: Once | INTRAMUSCULAR | Status: AC
  Administered 2020-10-22: 10 mg via INTRAVENOUS
  Filled 2020-10-22: qty 1

## 2020-10-22 MED ORDER — ENOXAPARIN SODIUM 40 MG/0.4ML ~~LOC~~ SOLN
40.0000 mg | SUBCUTANEOUS | Status: DC
  Administered 2020-10-22: 40 mg via SUBCUTANEOUS
  Filled 2020-10-22: qty 0.4

## 2020-10-22 MED ORDER — ENOXAPARIN SODIUM 30 MG/0.3ML ~~LOC~~ SOLN
30.0000 mg | SUBCUTANEOUS | Status: DC
  Administered 2020-10-23: 30 mg via SUBCUTANEOUS
  Filled 2020-10-22: qty 0.3

## 2020-10-22 MED ORDER — SODIUM CHLORIDE 0.9 % IV SOLN
2.0000 g | Freq: Once | INTRAVENOUS | Status: DC
  Filled 2020-10-22: qty 2000

## 2020-10-22 MED ORDER — INSULIN ASPART 100 UNIT/ML ~~LOC~~ SOLN: 0.0000 [IU] | Freq: Every day | SUBCUTANEOUS | Status: DC

## 2020-10-22 MED ORDER — LORAZEPAM 2 MG/ML IJ SOLN
1.0000 mg | Freq: Once | INTRAMUSCULAR | Status: AC
  Administered 2020-10-22: 1 mg via INTRAVENOUS
  Filled 2020-10-22: qty 1

## 2020-10-22 MED ORDER — LACTATED RINGERS IV SOLN: INTRAVENOUS | Status: DC

## 2020-10-22 MED ORDER — SODIUM CHLORIDE 0.9 % IV SOLN
2.0000 g | Freq: Once | INTRAVENOUS | Status: AC
  Administered 2020-10-22: 2 g via INTRAVENOUS
  Filled 2020-10-22: qty 20

## 2020-10-22 MED ORDER — LACTATED RINGERS IV BOLUS
1000.0000 mL | Freq: Once | INTRAVENOUS | Status: AC
  Administered 2020-10-22: 1000 mL via INTRAVENOUS

## 2020-10-22 MED ORDER — INSULIN ASPART 100 UNIT/ML ~~LOC~~ SOLN: 0.0000 [IU] | Freq: Three times a day (TID) | SUBCUTANEOUS | Status: DC

## 2020-10-22 NOTE — H&P (Addendum)
History and Physical    Carolyn Norton EVO:[id number] DOB: 14-Feb-1966 DOA: 10/22/2020  PCP: [name], NP  Patient coming from: Home  I have personally briefly reviewed patient's old medical records in [place]  Chief Complaint: Seizure like activity  HPI: Carolyn Norton is a 55 y.o. female with medical history significant for type 2 diabetes, bipolar type I, hypertension, recent Covid infection, and polysubstance abuse who presents due to concerns of seizure-like activity.  Patient lethargic and thrashing in bed during evaluation unable to provide history.  However she was able to tell me her name and that she was in [place].  History is obtained from ED physician and ED documentation.  Reportedly, patient went out last night and came home and was found to have seizure-like activity. Her mother is unclear whether she used cocaine but has history of use.  Family also unsure whether it could have been due to her sugar levels and whether she took insulin for the day.  Patient was given IM 5 mg of Versed by EMS.  In the ED she was slightly hypotensive at times down to 90s over 50s and was placed on 2 L via nasal cannula without documented hypoxia.  At bedside noted to have some brief apneic-like episodes which appears to be undiagnosed sleep apnea. Otherwise protecting her airway.   CBC shows leukocytosis of 18.7.  Initial lactate of 2.6 down to 1.6.  AKI with creatinine of 3.42 from a prior 1.14.  Anion gap of 16.  pH of 7.4 on VBG with bicarb of 28.  CK of 303.  Patient was started on IV antibiotics for presumed meningitis given her altered mental state prior to UDS result.  No CSF was unable to obtain due to patient's agitation  Review of Systems:  Unable to obtain given patient's lethargy  Past Medical History:  Diagnosis Date  . Anxiety   . Asthma   . Depression   . Diabetes mellitus without complication ([place])   . Hyperlipidemia   . Hypertension   .  Seasonal allergies     Past Surgical History:  Procedure Laterality Date  . BREAST EXCISIONAL BIOPSY Right   . BREAST SURGERY     right breast     reports that she has been smoking cigarettes. She has been smoking about 0.50 packs per day. She has never used smokeless tobacco. She reports current drug use. Drug: Cocaine. She reports that she does not drink alcohol. Social History  Allergies  Allergen Reactions  . Erythromycin Other (See Comments)    Childhood reaction    Family History  Problem Relation Age of Onset  . Hypertension Mother   . Cancer Mother   . Kidney cancer Mother   . Cancer Father        prostate  . Breast cancer Paternal Grandmother   . Leukemia Brother      Prior to Admission medications   Medication Sig Start Date End Date Taking? Authorizing Provider  albuterol (PROVENTIL HFA;VENTOLIN HFA) 108 (90 Base) MCG/ACT inhaler Inhale 2 puffs into the lungs every 4 (four) hours as needed for wheezing or shortness of breath. 10/21/16   Muthersbaugh, Jarrett Soho, PA-C  albuterol (PROVENTIL) (2.5 MG/3ML) 0.083% nebulizer solution Take 3 mLs (2.5 mg total) by nebulization every 6 (six) hours as needed for wheezing or shortness of breath. 09/13/20   [name], NP  atorvastatin (LIPITOR) 20 MG tablet Take 1 tablet (20 mg total) by mouth daily. 07/25/20  [name], NP  Blood Glucose Monitoring Suppl (TRUE METRIX METER) w/Device KIT Use as instructed. Check blood glucose level by fingerstick twice per day. 07/25/20   [name], NP  Dexlansoprazole (DEXILANT) 30 MG capsule Take 30 mg by mouth daily.    [provider]  divalproex (DEPAKOTE) 500 MG DR tablet Take 500 mg by mouth 2 (two) times daily.    [provider]  escitalopram (LEXAPRO) 20 MG tablet Take 1 tablet (20 mg total) by mouth daily. 07/25/20 10/23/20  [name], NP  gabapentin (NEURONTIN) 300 MG capsule Take 300 mg by mouth 2 (two) times daily.    [provider]   glucose blood (TRUE METRIX BLOOD GLUCOSE TEST) test strip Use as instructed. Check blood glucose level by fingerstick twice per day. 07/25/20   [name], NP  hydrOXYzine (ATARAX/VISTARIL) 25 MG tablet Take 1 tablet (25 mg total) by mouth every 8 (eight) hours as needed for anxiety. 07/25/20   [name], NP  insulin glargine (LANTUS) 100 UNIT/ML Solostar Pen Inject 45 Units into the skin daily at 10 pm. 07/25/20 11/02/20  Charlott Rakes, MD  lisinopril (ZESTRIL) 20 MG tablet Take 1 tablet (20 mg total) by mouth daily. 09/09/20 10/09/20  Little Ishikawa, MD  loratadine (CLARITIN) 10 MG tablet Take 1 tablet (10 mg total) by mouth daily. 09/13/20   [name], NP  montelukast (SINGULAIR) 10 MG tablet Take 1 tablet (10 mg total) by mouth at bedtime. 07/25/20 10/23/20  [name], NP  oxybutynin (DITROPAN) 5 MG tablet TAKE ONE TABLET BY MOUTH EVERY 6 TO 8 HOURS FOR URINARY FREQUENCY / URGENCY 09/25/20   McKenzie, Candee Furbish, MD  TRUEplus Lancets 28G MISC Use as instructed. Check blood glucose level by fingerstick twice per day. 07/25/20   [name], NP    Physical Exam: Vitals:   10/22/20 1719 10/22/20 1800 10/22/20 1845 10/22/20 1900  BP: (!) 114/55 117/71 116/77 96/77  Pulse: 86 75 83 84  Resp: (!) 21 19 20  (!) 24  Temp:      SpO2: 93% 94% 99% 99%    Constitutional: Lethargic ill-appearing female thrashing and restless in bed Vitals:   10/22/20 1719 10/22/20 1800 10/22/20 1845 10/22/20 1900  BP: (!) 114/55 117/71 116/77 96/77  Pulse: 86 75 83 84  Resp: (!) 21 19 20  (!) 24  Temp:      SpO2: 93% 94% 99% 99%   Eyes: PERRL, lids and conjunctivae normal ENMT: Dry mucous membrane.  Neck: normal, supple,  Respiratory: clear to auscultation bilaterally, no wheezing, no crackles.  Deep respiration with snoring. cardiovascular: Regular rate and rhythm, no murmurs / rubs / gallops. No extremity edema.  Abdomen: No grimacing with abdominal palpation.  Bowel sounds  positive.  Musculoskeletal: no clubbing / cyanosis. No joint deformity upper and lower extremities.Normal muscle tone.  Skin: no rashes, lesions, ulcers. No induration Neurologic: Patient lethargic and restless thrashing and moving all extremities in bed.  She was able to turn her head at the [facility] voice and able to tell me her name and answer correctly where she was at.  Otherwise would immediately fall back asleep. Psychiatric: Patient lethargic and obtunded.    Labs on Admission: I have personally reviewed following labs and imaging studies  CBC: Recent Labs  Lab 10/22/20 1522  WBC 18.7*  NEUTROABS 12.1*  HGB 13.0  HCT 38.2  MCV 83.2  PLT 962   Basic Metabolic Panel: Recent  Labs  Lab 10/22/20 1522  NA 139  K 4.0  CL 96*  CO2 27  GLUCOSE 123*  BUN 46*  CREATININE 3.42*  CALCIUM 10.3   GFR: CrCl cannot be calculated (Unknown ideal weight.). Liver Function Tests: Recent Labs  Lab 10/22/20 1522  AST 16  ALT 12  ALKPHOS 69  BILITOT 0.6  PROT 8.0  ALBUMIN 4.2   No results for input(s): LIPASE, AMYLASE in the last 168 hours. No results for input(s): AMMONIA in the last 168 hours. Coagulation Profile: No results for input(s): INR, PROTIME in the last 168 hours. Cardiac Enzymes: Recent Labs  Lab 10/22/20 1630  CKTOTAL 303*   BNP (last 3 results) No results for input(s): PROBNP in the last 8760 hours. HbA1C: No results for input(s): HGBA1C in the last 72 hours. CBG: Recent Labs  Lab 10/22/20 1338 10/22/20 1652  GLUCAP 210* 93   Lipid Profile: No results for input(s): CHOL, HDL, LDLCALC, TRIG, CHOLHDL, LDLDIRECT in the last 72 hours. Thyroid Function Tests: No results for input(s): TSH, T4TOTAL, FREET4, T3FREE, THYROIDAB in the last 72 hours. Anemia Panel: No results for input(s): VITAMINB12, FOLATE, FERRITIN, TIBC, IRON, RETICCTPCT in the last 72 hours. Urine analysis:    Component Value Date/Time   COLORURINE YELLOW 10/22/2020 1817    APPEARANCEUR HAZY (A) 10/22/2020 1817   LABSPEC 1.025 10/22/2020 1817   PHURINE 5.0 10/22/2020 1817   GLUCOSEU 150 (A) 10/22/2020 1817   HGBUR MODERATE (A) 10/22/2020 1817   BILIRUBINUR NEGATIVE 10/22/2020 1817   KETONESUR 5 (A) 10/22/2020 1817   PROTEINUR 30 (A) 10/22/2020 1817   UROBILINOGEN 0.2 07/20/2014 2022   NITRITE NEGATIVE 10/22/2020 1817   LEUKOCYTESUR NEGATIVE 10/22/2020 1817    Radiological Exams on Admission: CT Head Wo Contrast  Result Date: 10/22/2020 CLINICAL DATA:  Delirium.  Possible seizure. EXAM: CT HEAD WITHOUT CONTRAST TECHNIQUE: Contiguous axial images were obtained from the base of the skull through the vertex without intravenous contrast. COMPARISON:  09/06/2020 FINDINGS: Brain: There is no evidence of an acute infarct, intracranial hemorrhage, mass, midline shift, or extra-axial fluid collection. Lateral ventriculomegaly is unchanged and favored to reflect central predominant cerebral atrophy although normal pressure hydrocephalus is not excluded in the appropriate clinical setting. Hypodensities in the cerebral white matter bilaterally are unchanged and nonspecific but compatible with mild chronic small vessel ischemic disease. Vascular: Calcified atherosclerosis at the skull base. No hyperdense vessel. Skull: No fracture or suspicious osseous lesion. Sinuses/Orbits: Paranasal sinuses and mastoid air cells are clear. Unremarkable orbits. Other: None. IMPRESSION: 1. No evidence of acute intracranial abnormality. 2. Stable chronic findings as above. Electronically Signed   By: Logan Bores M.D.   On: 10/22/2020 17:40   DG Chest Portable 1 View  Result Date: 10/22/2020 CLINICAL DATA:  Altered mental status, possible seizure. EXAM: PORTABLE CHEST 1 VIEW COMPARISON:  09/06/2020 FINDINGS: The left hemidiaphragm is omitted. The remainder of the lungs appears clear. The patient is rotated to the right on today's radiograph, reducing diagnostic sensitivity and specificity. Upper  normal heart size. IMPRESSION: 1. No significant abnormality identified. Please note that the left lung base is omitted. Electronically Signed   By: Van Clines M.D.   On: 10/22/2020 15:06      Assessment/Plan  Acute metabolic encephalopathy secondary to polysubstance abuse UDS positive for benzo (received benzo in ED) and cocaine Patient brought in for seizure-like activity.  No witnessed seizure in the ED.  Placed on seizure protocol. Patient is very restless in bed and pulling  off lines.  Have 1:1 sitter at bedside. admit to stepdown on telemetry  Currently protecting own airway  Later obtained ABG with slight acidosis and minimal CO2 retention- I also reassessed the patient later at bedside on the floor.  Suspect she has chronic CO2 retention and undiagnosed OSA.  Leukocytosis Elevated to 18.7.  Patient initially started on broad-spectrum antibiotics for meningitis coverage by ED physician prior to the results of her UDS.  No CSF was obtained given patient agitation. Based on family's report, positive UDS results, no track marks found on exam and that patient is afebrile, I do not feel this is meningitis and will not continue antibiotics  Hypotension Hold antihypertensive Continuous IV 100 cc/h fluid  Acute anion gap metabolic acidosis Anion gap of 16 on admission likely secondary to drug use Continuous IV 100 cc/h fluid  AKI Creatinine of 3.42 from a prior of 1.14 Monitor with continuous fluids.  Avoid nephrotoxic agent.  Type 2 diabetes  Hemoglobin A1c of 11.8 in Dec  place on moderate SSI for now while she is NPO   Recent COVID infection  positive on 12/31 -okay to d/c airborne and contact precaution  DVT prophylaxis:.Lovenox Code Status: Full Family Communication: No family at bedside  disposition Plan: Home with at least 2 midnight stays  Consults called:  Admission status: inpatient   Level of care: Stepdown  Status is: Inpatient  Remains inpatient  appropriate because:Inpatient level of care appropriate due to severity of illness   Dispo: The patient is from: Home              Anticipated d/c is to: Home              Anticipated d/c date is: > 3 days              Patient currently is not medically stable to d/c.   Difficult to place patient No         Orene Desanctis DO [facility]   If 7PM-7AM, please contact night-coverage [email or web address]   10/22/2020, 7:40 PM

## 2020-10-22 NOTE — ED Notes (Signed)
Pt removed all VS equipment

## 2020-10-22 NOTE — ED Notes (Signed)
ED TO INPATIENT HANDOFF REPORT  Name/Age/Gender Carolyn Norton 55 y.o. female  Code Status    Code Status Orders  (From admission, onward)         Start     Ordered   10/22/20 1854  Full code  Continuous        10/22/20 1853        Code Status History    Date Active Date Inactive Code Status Order ID Comments User Context   09/06/2020 1710 09/09/2020 2034 Full Code [id number]  Rhetta Mura, MD ED   Advance Care Planning Activity      Home/SNF/Other Home  Chief Complaint Acute metabolic encephalopathy [G93.41]  Level of Care/Admitting Diagnosis ED Disposition    ED Disposition Condition Comment   Admit  Hospital Area: [facility] [[id number]]  Level of Care: Stepdown [14]  Admit to SDU based on following criteria: Respiratory Distress:  Frequent assessment and/or intervention to maintain adequate ventilation/respiration, pulmonary toilet, and respiratory treatment.  May admit patient to [facility] or [facility] if equivalent level of care is available:: No  Covid Evaluation: Asymptomatic Screening Protocol (No Symptoms)  Diagnosis: Acute metabolic encephalopathy [[id number]]  Admitting Physician: Anselm Jungling [[id number]]  Attending Physician: Anselm Jungling [[id number]]  Estimated length of stay: past midnight tomorrow  Certification:: I certify this patient will need inpatient services for at least 2 midnights       Medical History Past Medical History:  Diagnosis Date  . Anxiety   . Asthma   . Depression   . Diabetes mellitus without complication ([facility])   . Hyperlipidemia   . Hypertension   . Seasonal allergies     Allergies Allergies  Allergen Reactions  . Erythromycin Other (See Comments)    Childhood reaction    IV Location/Drains/Wounds Patient Lines/Drains/Airways Status    Active Line/Drains/Airways    Name Placement date Placement time Site Days   Peripheral IV 10/22/20 Anterior;Right Forearm 10/22/20  1515  Forearm  less  than 1   Peripheral IV 10/22/20 Right Antecubital 10/22/20  1821  Antecubital  less than 1   Wound / Incision (Open or Dehisced) 09/07/20 Skin tear Back Right;Lower dime size, superficial skin tear 09/07/20  1800  Back  45          Labs/Imaging Results for orders placed or performed during the hospital encounter of 10/22/20 (from the past 48 hour(s))  CBG monitoring, ED     Status: Abnormal   Collection Time: 10/22/20  1:38 PM  Result Value Ref Range   Glucose-Capillary 210 (H) 70 - 99 mg/dL    Comment: Glucose reference range applies only to samples taken after fasting for at least 8 hours.  Comprehensive metabolic panel     Status: Abnormal   Collection Time: 10/22/20  3:22 PM  Result Value Ref Range   Sodium 139 135 - 145 mmol/L   Potassium 4.0 3.5 - 5.1 mmol/L   Chloride 96 (L) 98 - 111 mmol/L   CO2 27 22 - 32 mmol/L   Glucose, Bld 123 (H) 70 - 99 mg/dL    Comment: Glucose reference range applies only to samples taken after fasting for at least 8 hours.   BUN 46 (H) 6 - 20 mg/dL   Creatinine, Ser 2.33 (H) 0.44 - 1.00 mg/dL   Calcium 00.7 8.9 - 62.2 mg/dL   Total Protein 8.0 6.5 - 8.1 g/dL   Albumin 4.2 3.5 - 5.0 g/dL   AST 16 15 -  41 U/L   ALT 12 0 - 44 U/L   Alkaline Phosphatase 69 38 - 126 U/L   Total Bilirubin 0.6 0.3 - 1.2 mg/dL   GFR, Estimated 15 (L) >60 mL/min    Comment: (NOTE) Calculated using the CKD-EPI Creatinine Equation (2021)    Anion gap 16 (H) 5 - 15    Comment: Performed at [facility], [address] [place], [place] [address]  Acetaminophen level     Status: Abnormal   Collection Time: 10/22/20  3:22 PM  Result Value Ref Range   Acetaminophen (Tylenol), Serum <10 (L) 10 - 30 ug/mL    Comment: (NOTE) Therapeutic concentrations vary significantly. A range of 10-30 ug/mL  may be an effective concentration for many patients. However, some  are best treated at concentrations outside of this range. Acetaminophen concentrations >150  ug/mL at 4 hours after ingestion  and >50 ug/mL at 12 hours after ingestion are often associated with  toxic reactions.  Performed at [facility], [address] [place], [place] [address]   Ethanol     Status: None   Collection Time: 10/22/20  3:22 PM  Result Value Ref Range   Alcohol, Ethyl (B) <10 <10 mg/dL    Comment: (NOTE) Lowest detectable limit for serum alcohol is 10 mg/dL.  For medical purposes only. Performed at [facility], [address] [place], [place] [address]   Lactic acid, plasma     Status: Abnormal   Collection Time: 10/22/20  3:22 PM  Result Value Ref Range   Lactic Acid, Venous 2.6 (HH) 0.5 - 1.9 mmol/L    Comment: CRITICAL VALUE NOTED.  VALUE IS CONSISTENT WITH PREVIOUSLY REPORTED AND CALLED VALUE. Performed at [facility], [address] [place], [place] [address]   CBC with Differential     Status: Abnormal   Collection Time: 10/22/20  3:22 PM  Result Value Ref Range   WBC 18.7 (H) 4.0 - 10.5 K/uL   RBC 4.59 3.87 - 5.11 MIL/uL   Hemoglobin 13.0 12.0 - 15.0 g/dL   HCT 29.538.2 62.136.0 - 30.846.0 %   MCV 83.2 80.0 - 100.0 fL   MCH 28.3 26.0 - 34.0 pg   MCHC 34.0 30.0 - 36.0 g/dL   RDW 65.713.5 84.611.5 - 96.215.5 %   Platelets 229 150 - 400 K/uL   nRBC 0.0 0.0 - 0.2 %   Neutrophils Relative % 65 %   Lymphocytes Relative 18 %   Monocytes Relative 16 %   Eosinophils Relative 0 %   Basophils Relative 1 %   Band Neutrophils 0 %   Metamyelocytes Relative 0 %   Myelocytes 0 %   Promyelocytes Relative 0 %   Blasts 0 %   nRBC 0 0 /100 WBC   Other 0 %   Neutro Abs 12.1 (H) 1.7 - 7.7 K/uL   Lymphs Abs 3.4 0.7 - 4.0 K/uL   Monocytes Absolute 3.0 (H) 0.1 - 1.0 K/uL   Eosinophils Absolute 0.0 0.0 - 0.5 K/uL   Basophils Absolute 0.2 (H) 0.0 - 0.1 K/uL   Abs Immature Granulocytes 0.00 0.00 - 0.07 K/uL    Comment: Performed at [facility], [address] [place], [place] [address]  Blood gas,  venous     Status: Abnormal   Collection Time: 10/22/20  3:22 PM  Result Value Ref Range   pH, Ven 7.407 7.250 - 7.430   pCO2, Ven 46.4 44.0 -  60.0 mmHg   pO2, Ven 45.7 (H) 32.0 - 45.0 mmHg   Bicarbonate 28.6 (H) 20.0 - 28.0 mmol/L   Acid-Base Excess 3.7 (H) 0.0 - 2.0 mmol/L   O2 Saturation 79.9 %   Patient temperature 98.6     Comment: Performed at [facility], [address], [place], [place] [address]  CK     Status: Abnormal   Collection Time: 10/22/20  4:30 PM  Result Value Ref Range   Total CK 303 (H) 38 - 234 U/L    Comment: Performed at [facility], [address], [place], [place] [address]  CBG monitoring, ED     Status: None   Collection Time: 10/22/20  4:52 PM  Result Value Ref Range   Glucose-Capillary 93 70 - 99 mg/dL    Comment: Glucose reference range applies only to samples taken after fasting for at least 8 hours.  Lactic acid, plasma     Status: None   Collection Time: 10/22/20  4:54 PM  Result Value Ref Range   Lactic Acid, Venous 1.6 0.5 - 1.9 mmol/L    Comment: Performed at [facility], [address], [place], [place] [address]  Urinalysis, Routine w reflex microscopic     Status: Abnormal   Collection Time: 10/22/20  6:17 PM  Result Value Ref Range   Color, Urine YELLOW YELLOW   APPearance HAZY (A) CLEAR   Specific Gravity, Urine 1.025 1.005 - 1.030   pH 5.0 5.0 - 8.0   Glucose, UA 150 (A) NEGATIVE mg/dL   Hgb urine dipstick MODERATE (A) NEGATIVE   Bilirubin Urine NEGATIVE NEGATIVE   Ketones, ur 5 (A) NEGATIVE mg/dL   Protein, ur 30 (A) NEGATIVE mg/dL   Nitrite NEGATIVE NEGATIVE   Leukocytes,Ua NEGATIVE NEGATIVE   RBC / HPF 6-10 0 - 5 RBC/hpf   WBC, UA 0-5 0 - 5 WBC/hpf   Bacteria, UA RARE (A) NONE SEEN   Squamous Epithelial / LPF 11-20 0 - 5   Mucus PRESENT    Hyaline Casts, UA PRESENT     Comment: Performed at [facility], [address] [place], [place] [address]    CT Head Wo Contrast  Result Date: 10/22/2020 CLINICAL DATA:  Delirium.  Possible seizure. EXAM: CT HEAD WITHOUT CONTRAST TECHNIQUE: Contiguous axial images were obtained from the base of the skull through the vertex without intravenous contrast. COMPARISON:  09/06/2020 FINDINGS: Brain: There is no evidence of an acute infarct, intracranial hemorrhage, mass, midline shift, or extra-axial fluid collection. Lateral ventriculomegaly is unchanged and favored to reflect central predominant cerebral atrophy although normal pressure hydrocephalus is not excluded in the appropriate clinical setting. Hypodensities in the cerebral white matter bilaterally are unchanged and nonspecific but compatible with mild chronic small vessel ischemic disease. Vascular: Calcified atherosclerosis at the skull base. No hyperdense vessel. Skull: No fracture or suspicious osseous lesion. Sinuses/Orbits: Paranasal sinuses and mastoid air cells are clear. Unremarkable orbits. Other: None. IMPRESSION: 1. No evidence of acute intracranial abnormality. 2. Stable chronic findings as above. Electronically Signed   By: Sebastian Ache M.D.   On: 10/22/2020 17:40   DG Chest Portable 1 View  Result Date: 10/22/2020 CLINICAL DATA:  Altered mental status, possible seizure. EXAM: PORTABLE CHEST 1 VIEW COMPARISON:  09/06/2020 FINDINGS: The left hemidiaphragm is omitted. The remainder of the lungs appears clear. The patient is rotated to the right on today's radiograph, reducing diagnostic sensitivity and specificity. Upper normal heart size. IMPRESSION:  1. No significant abnormality identified. Please note that the left lung base is omitted. Electronically Signed   By: Gaylyn Rong M.D.   On: 10/22/2020 15:06    Pending Labs Unresulted Labs (From admission, onward)          Start     Ordered   10/23/20 0500  Comprehensive metabolic panel  Tomorrow morning,   R        10/22/20 1853   10/23/20 0500  CBC  Tomorrow morning,   R         10/22/20 1853   10/23/20 0500  Procalcitonin  Daily,   R      10/22/20 1854   10/22/20 1855  Procalcitonin - Baseline  ONCE - STAT,   STAT        10/22/20 1854   10/22/20 1853  HIV Antibody (routine testing w rflx)  (HIV Antibody (Routine testing w reflex) panel)  Once,   STAT        10/22/20 1853   10/22/20 1705  Culture, blood (routine x 2)  BLOOD CULTURE X 2,   STAT      10/22/20 1704   10/22/20 1619  Urine culture  ONCE - STAT,   STAT        10/22/20 1618   10/22/20 1351  Urine rapid drug screen (hosp performed)  ONCE - STAT,   STAT        10/22/20 1351          Vitals/Pain Today's Vitals   10/22/20 1719 10/22/20 1800 10/22/20 1845 10/22/20 1900  BP: (!) 114/55 117/71 116/77 96/77  Pulse: 86 75 83 84  Resp: (!) 21 19 20  (!) 24  Temp:      SpO2: 93% 94% 99% 99%  PainSc:        Isolation Precautions No active isolations  Medications Medications  ampicillin (OMNIPEN) 2 g in sodium chloride 0.9 % 100 mL IVPB (has no administration in time range)  vancomycin (VANCOCIN) IVPB 1000 mg/200 mL premix (has no administration in time range)  enoxaparin (LOVENOX) injection 40 mg (has no administration in time range)  lactated ringers infusion (has no administration in time range)  sodium chloride 0.9 % bolus 1,000 mL (0 mLs Intravenous Stopped 10/22/20 1656)  LORazepam (ATIVAN) injection 2 mg (2 mg Intravenous Given 10/22/20 1520)  lactated ringers bolus 1,000 mL (1,000 mLs Intravenous New Bag/Given 10/22/20 1656)  LORazepam (ATIVAN) injection 1 mg (1 mg Intravenous Given 10/22/20 1637)  cefTRIAXone (ROCEPHIN) 2 g in sodium chloride 0.9 % 100 mL IVPB (2 g Intravenous New Bag/Given 10/22/20 1839)  dexamethasone (DECADRON) injection 10 mg (10 mg Intravenous Given 10/22/20 1822)    Mobility walks with person assist

## 2020-10-22 NOTE — ED Provider Notes (Signed)
Patient care assumed at 1600.  Pt here with AMS.  Labs and CT head pending.    At time of handoff patient lying in stretcher, somnolent but arouses to verbal stimuli. She does not follow commands. She moves all extremities. She has very dry mucous membranes, tachypnea.  CBC with leukocytosis. In setting of altered mental status and unclear source, broad-spectrum antibiotics were started for possible meningitis. CT head without acute abnormality.  Additional hx obtained from patient's mother after her initial ED presentation. She lives with the mother. Per the mother she was at her baseline state of health yesterday and did go out last night. Mother states that she has a history of crack and marijuana use. This morning she heard her in the bedroom and it sounded like she was possibly having a seizure. She heard this after 10 AM and it lasted for about two hours. When she went into the room she found the patient on the floor. She tried to assist her up but couldn't because she was a dead weight. She did have chronic pain around Christmas. She has been complaining of headaches recently. Mother also states that she has been staggering a lot lately. Mother is unsure how often she does drugs.  With additional history from mother feel that meningitis is less likely at this time. Patient will need admitted for further evaluation for altered mental status with acute kidney injury. Hospitalist consulted for admission for ongoing workup and treatment.     Tilden Fossa, MD 10/22/20 [phone]

## 2020-10-22 NOTE — ED Triage Notes (Signed)
Per EMS-family called 911 for possible "seizure" due to her DM-CBG 215-patient is compliant with her insulin-states patient has a history of crack use-patient is restless, can not lay still-5 mg of versed IM given in route-patient has not admitted to any substance use

## 2020-10-22 NOTE — ED Provider Notes (Signed)
[facility] DEPT Provider Note   CSN: [id number] Arrival date & time: 10/22/20  1317  LEVEL 5 CAVEAT - ALTERED MENTAL STATUS  History Chief Complaint  Patient presents with  . Addiction Problem    Carolyn Norton is a 55 y.o. female.  HPI 54 year old female presents in for possible "seizure".  History is quite limited as the patient seems confused.  Sometimes she will answer questions and other times not.  EMS reported that patient had some sort of issue with her glucose causing a possible seizure.  The patient tells me today that her glucose got low but then more talk to me further.  She is constantly moving around and will sit still.  She denies illicit drug use.  EMS reported a history of crack use.  They gave her 5 mg IM Versed.   Past Medical History:  Diagnosis Date  . Anxiety   . Asthma   . Depression   . Diabetes mellitus without complication ([place])   . Hyperlipidemia   . Hypertension   . Seasonal allergies     Patient Active Problem List   Diagnosis Date Noted  . Altered mental status   . Hyperosmolar hyperglycemic state (HHS) ([place])   . Hyperglycemia   . Hyperkalemia   . AKI (acute kidney injury) ([place])   . Essential hypertension   . DKA (diabetic ketoacidosis) ([place]) 09/06/2020  . COVID-19 09/06/2020  . Bipolar 1 disorder ([place]) 09/06/2020  . Breast mass status post excision 09/06/2020  . Smoker 09/06/2020  . Lactic acidosis on admission probably noninfectious 09/06/2020    Past Surgical History:  Procedure Laterality Date  . BREAST EXCISIONAL BIOPSY Right   . BREAST SURGERY     right breast     OB History    Gravida  0   Para  0   Term  0   Preterm  0   AB  0   Living  0     SAB  0   IAB  0   Ectopic  0   Multiple  0   Live Births  0           Family History  Problem Relation Age of Onset  . Hypertension Mother   . Cancer Mother   . Kidney cancer Mother   . Cancer Father        prostate   . Breast cancer Paternal Grandmother   . Leukemia Brother     Social History   Tobacco Use  . Smoking status: Current Every Day Smoker    Packs/day: 0.50    Types: Cigarettes  . Smokeless tobacco: Never Used  Vaping Use  . Vaping Use: Never used  Substance Use Topics  . Alcohol use: No  . Drug use: Yes    Types: Cocaine    Home Medications Prior to Admission medications   Medication Sig Start Date End Date Taking? Authorizing Provider  albuterol (PROVENTIL HFA;VENTOLIN HFA) 108 (90 Base) MCG/ACT inhaler Inhale 2 puffs into the lungs every 4 (four) hours as needed for wheezing or shortness of breath. 10/21/16   Muthersbaugh, Jarrett Soho, PA-C  albuterol (PROVENTIL) (2.5 MG/3ML) 0.083% nebulizer solution Take 3 mLs (2.5 mg total) by nebulization every 6 (six) hours as needed for wheezing or shortness of breath. 09/13/20   [name], NP  atorvastatin (LIPITOR) 20 MG tablet Take 1 tablet (20 mg total) by mouth daily. 07/25/20   [name], NP  Blood Glucose Monitoring Suppl (  TRUE METRIX METER) w/Device KIT Use as instructed. Check blood glucose level by fingerstick twice per day. 07/25/20   [name], NP  Dexlansoprazole (DEXILANT) 30 MG capsule Take 30 mg by mouth daily.    [provider]  divalproex (DEPAKOTE) 500 MG DR tablet Take 500 mg by mouth 2 (two) times daily.    [provider]  escitalopram (LEXAPRO) 20 MG tablet Take 1 tablet (20 mg total) by mouth daily. 07/25/20 10/23/20  [name], NP  gabapentin (NEURONTIN) 300 MG capsule Take 300 mg by mouth 2 (two) times daily.    [provider]  glucose blood (TRUE METRIX BLOOD GLUCOSE TEST) test strip Use as instructed. Check blood glucose level by fingerstick twice per day. 07/25/20   [name], NP  hydrOXYzine (ATARAX/VISTARIL) 25 MG tablet Take 1 tablet (25 mg total) by mouth every 8 (eight) hours as needed for anxiety. 07/25/20   [name], NP  insulin glargine  (LANTUS) 100 UNIT/ML Solostar Pen Inject 45 Units into the skin daily at 10 pm. 07/25/20 11/02/20  Charlott Rakes, MD  lisinopril (ZESTRIL) 20 MG tablet Take 1 tablet (20 mg total) by mouth daily. 09/09/20 10/09/20  Little Ishikawa, MD  loratadine (CLARITIN) 10 MG tablet Take 1 tablet (10 mg total) by mouth daily. 09/13/20   [name], NP  montelukast (SINGULAIR) 10 MG tablet Take 1 tablet (10 mg total) by mouth at bedtime. 07/25/20 10/23/20  [name], NP  oxybutynin (DITROPAN) 5 MG tablet TAKE ONE TABLET BY MOUTH EVERY 6 TO 8 HOURS FOR URINARY FREQUENCY / URGENCY 09/25/20   McKenzie, Candee Furbish, MD  TRUEplus Lancets 28G MISC Use as instructed. Check blood glucose level by fingerstick twice per day. 07/25/20   [name], NP    Allergies    Erythromycin  Review of Systems   Review of Systems  Unable to perform ROS: Mental status change    Physical Exam Updated Vital Signs BP (!) 152/115 (BP Location: Right Arm)   Pulse (!) 104   Temp 98.5 F (36.9 C)   Resp 20   LMP 01/26/2017 (Approximate)   SpO2 95%   Physical Exam Vitals and nursing note reviewed.  Constitutional:      Appearance: She is well-developed and well-nourished. She is not diaphoretic.  HENT:     Head: Normocephalic and atraumatic.     Right Ear: External ear normal.     Left Ear: External ear normal.     Nose: Nose normal.  Eyes:     General:        Right eye: No discharge.        Left eye: No discharge.     Extraocular Movements: Extraocular movements intact.  Cardiovascular:     Rate and Rhythm: Regular rhythm. Tachycardia present.     Heart sounds: Normal heart sounds.  Pulmonary:     Effort: Pulmonary effort is normal.     Breath sounds: Normal breath sounds.  Abdominal:     Palpations: Abdomen is soft.     Tenderness: There is no abdominal tenderness.  Musculoskeletal:     Cervical back: Neck supple. No rigidity.  Skin:    General: Skin is warm and dry.  Neurological:      Mental Status: She is alert.     Comments: Awake, alert, oriented to place and self. Disoriented to time. Does not follow commands well. Constantly moving in stretcher, and halfway hanging off at one  point. Moves all 4 extremities equally  Psychiatric:        Mood and Affect: Mood is not anxious.     ED Results / Procedures / Treatments   Labs (all labs ordered are listed, but only abnormal results are displayed) Labs Reviewed  CBG MONITORING, ED - Abnormal; Notable for the following components:      Result Value   Glucose-Capillary 210 (*)    All other components within normal limits  COMPREHENSIVE METABOLIC PANEL  ACETAMINOPHEN LEVEL  ETHANOL  LACTIC ACID, PLASMA  LACTIC ACID, PLASMA  CBC WITH DIFFERENTIAL/PLATELET  BLOOD GAS, VENOUS  URINALYSIS, ROUTINE W REFLEX MICROSCOPIC  RAPID URINE DRUG SCREEN, HOSP PERFORMED    EKG EKG Interpretation  Date/Time:  Tuesday October 22 2020 14:07:30 EST Ventricular Rate:  96 PR Interval:    QRS Duration: 94 QT Interval:  389 QTC Calculation: 492 R Axis:   44 Text Interpretation: Sinus rhythm Nonspecific repol abnormality, diffuse leads similar to Dec 2021 Confirmed by Sherwood Gambler [phone]) on 10/22/2020 4:03:27 PM   Radiology DG Chest Portable 1 View  Result Date: 10/22/2020 CLINICAL DATA:  Altered mental status, possible seizure. EXAM: PORTABLE CHEST 1 VIEW COMPARISON:  09/06/2020 FINDINGS: The left hemidiaphragm is omitted. The remainder of the lungs appears clear. The patient is rotated to the right on today's radiograph, reducing diagnostic sensitivity and specificity. Upper normal heart size. IMPRESSION: 1. No significant abnormality identified. Please note that the left lung base is omitted. Electronically Signed   By: Van Clines M.D.   On: 10/22/2020 15:06    Procedures Procedures   Medications Ordered in ED Medications  sodium chloride 0.9 % bolus 1,000 mL (has no administration in time range)    ED Course   I have reviewed the triage vital signs and the nursing notes.  Pertinent labs & imaging results that were available during my care of the patient were reviewed by me and considered in my medical decision making (see chart for details).    MDM Rules/Calculators/A&P                          Patient presents with altered mental status.  History is quite limited.  Patient is not following commands very well though is protecting her airway.  She is moving around so much that she will be given IV Ativan for medical sedation to help with work-up.  She will need head CT, labs, and probable admission.  Care to Dr. Ralene Bathe. Final Clinical Impression(s) / ED Diagnoses Final diagnoses:  None    Rx / DC Orders ED Discharge Orders    None       Sherwood Gambler, MD 10/22/20 1622

## 2020-10-23 DIAGNOSIS — E875 Hyperkalemia: Secondary | ICD-10-CM

## 2020-10-23 DIAGNOSIS — G929 Unspecified toxic encephalopathy: Secondary | ICD-10-CM

## 2020-10-23 DIAGNOSIS — F319 Bipolar disorder, unspecified: Secondary | ICD-10-CM

## 2020-10-23 LAB — BLOOD GAS, ARTERIAL
Acid-Base Excess: 3.1 mmol/L — ABNORMAL HIGH (ref 0.0–2.0)
Bicarbonate: 26.9 mmol/L (ref 20.0–28.0)
O2 Saturation: 98.1 %
Patient temperature: 98.6
pCO2 arterial: 39.9 mmHg (ref 32.0–48.0)
pH, Arterial: 7.444 (ref 7.350–7.450)
pO2, Arterial: 114 mmHg — ABNORMAL HIGH (ref 83.0–108.0)

## 2020-10-23 LAB — CBC
HCT: 43.4 % (ref 36.0–46.0)
Hemoglobin: 14 g/dL (ref 12.0–15.0)
MCH: 28.5 pg (ref 26.0–34.0)
MCHC: 32.3 g/dL (ref 30.0–36.0)
MCV: 88.4 fL (ref 80.0–100.0)
Platelets: 175 10*3/uL (ref 150–400)
RBC: 4.91 MIL/uL (ref 3.87–5.11)
RDW: 14.2 % (ref 11.5–15.5)
WBC: 10.9 10*3/uL — ABNORMAL HIGH (ref 4.0–10.5)
nRBC: 0 % (ref 0.0–0.2)

## 2020-10-23 LAB — GLUCOSE, CAPILLARY
Glucose-Capillary: 107 mg/dL — ABNORMAL HIGH (ref 70–99)
Glucose-Capillary: 117 mg/dL — ABNORMAL HIGH (ref 70–99)
Glucose-Capillary: 118 mg/dL — ABNORMAL HIGH (ref 70–99)
Glucose-Capillary: 120 mg/dL — ABNORMAL HIGH (ref 70–99)
Glucose-Capillary: 122 mg/dL — ABNORMAL HIGH (ref 70–99)
Glucose-Capillary: 125 mg/dL — ABNORMAL HIGH (ref 70–99)
Glucose-Capillary: 132 mg/dL — ABNORMAL HIGH (ref 70–99)
Glucose-Capillary: 28 mg/dL — CL (ref 70–99)
Glucose-Capillary: 93 mg/dL (ref 70–99)
Glucose-Capillary: 98 mg/dL (ref 70–99)

## 2020-10-23 LAB — COMPREHENSIVE METABOLIC PANEL
ALT: 13 U/L (ref 0–44)
AST: 25 U/L (ref 15–41)
Albumin: 3.6 g/dL (ref 3.5–5.0)
Alkaline Phosphatase: 57 U/L (ref 38–126)
Anion gap: 13 (ref 5–15)
BUN: 40 mg/dL — ABNORMAL HIGH (ref 6–20)
CO2: 27 mmol/L (ref 22–32)
Calcium: 9.3 mg/dL (ref 8.9–10.3)
Chloride: 99 mmol/L (ref 98–111)
Creatinine, Ser: 2.59 mg/dL — ABNORMAL HIGH (ref 0.44–1.00)
GFR, Estimated: 22 mL/min — ABNORMAL LOW (ref >60)
Glucose, Bld: 121 mg/dL — ABNORMAL HIGH (ref 70–99)
Potassium: 5.4 mmol/L — ABNORMAL HIGH (ref 3.5–5.1)
Sodium: 139 mmol/L (ref 135–145)
Total Bilirubin: 0.5 mg/dL (ref 0.3–1.2)
Total Protein: 7.7 g/dL (ref 6.5–8.1)

## 2020-10-23 LAB — PROCALCITONIN: Procalcitonin: 2.77 ng/mL

## 2020-10-23 MED ORDER — CHLORHEXIDINE GLUCONATE CLOTH 2 % EX PADS
6.0000 | MEDICATED_PAD | Freq: Every day | CUTANEOUS | Status: DC
  Administered 2020-10-24 – 2020-10-26 (×4): 6 via TOPICAL

## 2020-10-23 NOTE — Progress Notes (Signed)
Pt has not voided since brought to unit. Bladder scan = 150. Provider informed

## 2020-10-23 NOTE — Progress Notes (Signed)
PROGRESS NOTE  Carolyn Norton ZOX:[id number] DOB: May 08, 1966   PCP: Claiborne RiggFleming, Zelda W, NP  Patient is from: Home  DOA: 10/22/2020 LOS: 1  Chief complaints: Seizure-like activity  Brief Narrative / Interim history: 55 year old F with PMH of DM-2, BPD type I, HTN, recent COVID-19 infection and polysubstance use presenting with "seizure-like activity" and admitted for acute metabolic encephalopathy and AKI likely due to cocaine use.  UDS was positive for cocaine.  Subjective: Seen and examined earlier this morning.  Patient was restless in bed and pulling of lines.  Has had one-to-one safety sitter at bedside.  Became restless when she woke up.  She is oriented to self.  She knows she is in the hospital but could not tell me the name.  Not oriented to time or situation.  Objective: Vitals:   10/23/20 1000 10/23/20 1100 10/23/20 1200 10/23/20 1202  BP: (!) 148/77 129/78 (!) 143/76   Pulse: 66 81    Resp: 18 19 17    Temp:    (!) 97.3 F (36.3 C)  TempSrc:    Axillary  SpO2: 97% 91% 94%   Weight:      Height:        Intake/Output Summary (Last 24 hours) at 10/23/2020 1324 Last data filed at 10/23/2020 0800 Gross per 24 hour  Intake 994.44 ml  Output -  Net 994.44 ml   Filed Weights   10/22/20 2100  Weight: 85.5 kg    Examination:  GENERAL: No apparent distress.  Nontoxic. HEENT: MMM.  Vision and hearing grossly intact.  NECK: Supple.  No nuchal rigidity. RESP:  No IWOB.  Fair aeration bilaterally. CVS:  RRR. Heart sounds normal.  ABD/GI/GU: BS+. Abd soft, NTND.  MSK/EXT:  Moves extremities. No apparent deformity. No edema.  SKIN: no apparent skin lesion or wound NEURO: Sleepy but wakes to voice.  Oriented to self and "hospital".  PERRL.  No facial asymmetry.  Restless but no apparent focal neuro deficit. PSYCH: Restless in bed.  Procedures:  None  Microbiology summarized: COVID-19 and influenza PCR nonreactive.  Assessment & Plan: Acute toxic  encephalopathy likely due to cocaine abuse.  Some concern about seizure-like activity but no witnessed seizure since arrival.  No focal neuro symptoms but limited exam.  Doubt meningitis.  Overall, encephalopathy seems to be improving.  Oriented to self and "hospital". -Continue one-to-one Rec[employer] consultantsafety sitter and seizure precautions -Fall and aspiration precautions -SLP eval  Cocaine abuse -Will counsel once encephalopathy resolves. -TOC consult for resources and counseling  Hypotension: Likely dehydration.  Hypotension resolved. -Continue holding home antihypertensive meds -Continue IV fluids  AKI/azotemia on CKD-3A: Likely a mix of prerenal from dehydration and ATN from cocaine use Recent Labs    09/06/20 1031 09/06/20 1229 09/06/20 1900 09/06/20 1948 09/07/20 0029 09/07/20 0616 09/08/20 0236 10/22/20 1522 10/23/20 0313  BUN 58* 57* 49* 47* 43* 41* 32* 46* 40*  CREATININE 2.94* 2.98* 2.13* 2.00* 1.65* 1.44* 1.14* 3.42* 2.59*  -Continue IV fluid. -Hold home lisinopril  Anion gap metabolic acidosis: Likely due to azotemia and AKI.  Resolved.  Hyperkalemia: 5.4. -Continue monitoring -Continue IV fluid  Uncontrolled IDDM-2 Recent Labs  Lab 10/23/20 0213 10/23/20 0448 10/23/20 0615 10/23/20 0805 10/23/20 1200  GLUCAP 117* 132* 125* 98 93  -Monitor CBG every 4 hours. -Continue home statin.  Leukocytosis/bandemia: Likely demargination and hemoconcentration in the setting of cocaine use.  Unlikely meningitis. -No antibiotics.  Bipolar disorder type I -Continue home Depakote, Lexapro when able to take p.o. Body mass  index is 30.42 kg/m.         DVT prophylaxis:  enoxaparin (LOVENOX) injection 30 mg Start: 10/23/20 2200  Code Status: Full code Family Communication: Patient and/or RN. Available if any question.  Level of care: Stepdown Status is: Inpatient  Remains inpatient appropriate because:Persistent severe electrolyte disturbances, Altered mental status,  Unsafe d/c plan, IV treatments appropriate due to intensity of illness or inability to take PO and Inpatient level of care appropriate due to severity of illness   Dispo: The patient is from: Home              Anticipated d/c is to: Home              Anticipated d/c date is: 2 days              Patient currently is not medically stable to d/c.   Difficult to place patient No       Consultants:  None   Sch Meds:  Scheduled Meds: . Chlorhexidine Gluconate Cloth  6 each Topical Daily  . enoxaparin (LOVENOX) injection  30 mg Subcutaneous Q24H   Continuous Infusions: . lactated ringers 100 mL/hr at 10/23/20 0800   PRN Meds:.  Antimicrobials: Anti-infectives (From admission, onward)   Start     Dose/Rate Route Frequency Ordered Stop   10/22/20 1721  ampicillin (OMNIPEN) 2 g in sodium chloride 0.9 % 100 mL IVPB  Status:  Discontinued        2 g 300 mL/hr over 20 Minutes Intravenous  Once 10/22/20 1705 10/22/20 2106   10/22/20 1715  cefTRIAXone (ROCEPHIN) 2 g in sodium chloride 0.9 % 100 mL IVPB        2 g 200 mL/hr over 30 Minutes Intravenous  Once 10/22/20 1705 10/22/20 1909   10/22/20 1715  vancomycin (VANCOCIN) IVPB 1000 mg/200 mL premix  Status:  Discontinued        1,000 mg 200 mL/hr over 60 Minutes Intravenous  Once 10/22/20 1705 10/22/20 2106       I have personally reviewed the following labs and images: CBC: Recent Labs  Lab 10/22/20 1522 10/23/20 0313  WBC 18.7* 10.9*  NEUTROABS 12.1*  --   HGB 13.0 14.0  HCT 38.2 43.4  MCV 83.2 88.4  PLT 229 175   BMP &GFR Recent Labs  Lab 10/22/20 1522 10/23/20 0313  NA 139 139  K 4.0 5.4*  CL 96* 99  CO2 27 27  GLUCOSE 123* 121*  BUN 46* 40*  CREATININE 3.42* 2.59*  CALCIUM 10.3 9.3   Estimated Creatinine Clearance: 27.7 mL/min (A) (by C-G formula based on SCr of 2.59 mg/dL (H)). Liver & Pancreas: Recent Labs  Lab 10/22/20 1522 10/23/20 0313  AST 16 25  ALT 12 13  ALKPHOS 69 57  BILITOT 0.6 0.5   PROT 8.0 7.7  ALBUMIN 4.2 3.6   No results for input(s): LIPASE, AMYLASE in the last 168 hours. No results for input(s): AMMONIA in the last 168 hours. Diabetic: No results for input(s): HGBA1C in the last 72 hours. Recent Labs  Lab 10/23/20 0213 10/23/20 0448 10/23/20 0615 10/23/20 0805 10/23/20 1200  GLUCAP 117* 132* 125* 98 93   Cardiac Enzymes: Recent Labs  Lab 10/22/20 1630  CKTOTAL 303*   No results for input(s): PROBNP in the last 8760 hours. Coagulation Profile: No results for input(s): INR, PROTIME in the last 168 hours. Thyroid Function Tests: No results for input(s): TSH, T4TOTAL, FREET4, T3FREE, THYROIDAB in the last 72  hours. Lipid Profile: No results for input(s): CHOL, HDL, LDLCALC, TRIG, CHOLHDL, LDLDIRECT in the last 72 hours. Anemia Panel: No results for input(s): VITAMINB12, FOLATE, FERRITIN, TIBC, IRON, RETICCTPCT in the last 72 hours. Urine analysis:    Component Value Date/Time   COLORURINE YELLOW 10/22/2020 1817   APPEARANCEUR HAZY (A) 10/22/2020 1817   LABSPEC 1.025 10/22/2020 1817   PHURINE 5.0 10/22/2020 1817   GLUCOSEU 150 (A) 10/22/2020 1817   HGBUR MODERATE (A) 10/22/2020 1817   BILIRUBINUR NEGATIVE 10/22/2020 1817   KETONESUR 5 (A) 10/22/2020 1817   PROTEINUR 30 (A) 10/22/2020 1817   UROBILINOGEN 0.2 07/20/2014 2022   NITRITE NEGATIVE 10/22/2020 1817   LEUKOCYTESUR NEGATIVE 10/22/2020 1817   Sepsis Labs: Invalid input(s): PROCALCITONIN, LACTICIDVEN  Microbiology: Recent Results (from the past 240 hour(s))  Culture, blood (routine x 2)     Status: None (Preliminary result)   Collection Time: 10/22/20  6:17 PM   Specimen: BLOOD  Result Value Ref Range Status   Specimen Description   Final    BLOOD RIGHT ANTECUBITAL Performed at [facility], [address], [place], [place] [address]    Special Requests   Final    BOTTLES DRAWN AEROBIC AND ANAEROBIC Blood Culture adequate volume Performed at [facility], [address], [place], [place] [address]    Culture   Final    NO GROWTH < 12 HOURS Performed at [facility], [address], [place], [place] [address]    Report Status PENDING  Incomplete  Resp Panel by RT-PCR (Flu A&B, Covid) Nasopharyngeal Swab     Status: None   Collection Time: 10/22/20  8:00 PM   Specimen: Nasopharyngeal Swab; Nasopharyngeal(NP) swabs in vial transport medium  Result Value Ref Range Status   SARS Coronavirus 2 by RT PCR NEGATIVE NEGATIVE Final    Comment: (NOTE) SARS-CoV-2 target nucleic acids are NOT DETECTED.  The SARS-CoV-2 RNA is generally detectable in upper respiratory specimens during the acute phase of infection. The lowest concentration of SARS-CoV-2 viral copies this assay can detect is 138 copies/mL. A negative result does not preclude SARS-Cov-2 infection and should not be used as the sole basis for treatment or other patient management decisions. A negative result may occur with  improper specimen collection/handling, submission of specimen other than nasopharyngeal swab, presence of viral mutation(s) within the areas targeted by this assay, and inadequate number of viral copies(<138 copies/mL). A negative result must be combined with clinical observations, patient history, and epidemiological information. The expected result is Negative.  Fact Sheet for Patients:  BloggerCourse.com  Fact Sheet for Healthcare Providers:  SeriousBroker.it  This test is no t yet approved or cleared by the [place] FDA and  has been authorized for detection and/or diagnosis of SARS-CoV-2 by FDA under an Emergency Use Authorization (EUA). This EUA will remain  in effect (meaning this test can be used) for the duration of the COVID-19 declaration under Section 564(b)(1) of the Act, 21 U.S.C.section 360bbb-3(b)(1), unless the authorization is terminated  or revoked sooner.        Influenza A by PCR NEGATIVE NEGATIVE Final   Influenza B by PCR NEGATIVE NEGATIVE Final    Comment: (NOTE) The Xpert Xpress SARS-CoV-2/FLU/RSV plus assay is intended as an aid in the diagnosis of influenza from Nasopharyngeal swab specimens and should not be used as a sole basis for treatment. Nasal washings and aspirates are unacceptable for Xpert Xpress SARS-CoV-2/FLU/RSV testing.  Fact Sheet for Patients:  BloggerCourse.com  Fact Sheet for Healthcare Providers: SeriousBroker.it  This test is not yet approved or cleared by the [place] FDA and has been authorized for detection and/or diagnosis of SARS-CoV-2 by FDA under an Emergency Use Authorization (EUA). This EUA will remain in effect (meaning this test can be used) for the duration of the COVID-19 declaration under Section 564(b)(1) of the Act, 21 U.S.C. section 360bbb-3(b)(1), unless the authorization is terminated or revoked.  Performed at [facility], [address] [place], [place] [address]     Radiology Studies: CT Head Wo Contrast  Result Date: 10/22/2020 CLINICAL DATA:  Delirium.  Possible seizure. EXAM: CT HEAD WITHOUT CONTRAST TECHNIQUE: Contiguous axial images were obtained from the base of the skull through the vertex without intravenous contrast. COMPARISON:  09/06/2020 FINDINGS: Brain: There is no evidence of an acute infarct, intracranial hemorrhage, mass, midline shift, or extra-axial fluid collection. Lateral ventriculomegaly is unchanged and favored to reflect central predominant cerebral atrophy although normal pressure hydrocephalus is not excluded in the appropriate clinical setting. Hypodensities in the cerebral white matter bilaterally are unchanged and nonspecific but compatible with mild chronic small vessel ischemic disease. Vascular: Calcified atherosclerosis at the skull base. No hyperdense vessel. Skull: No fracture or  suspicious osseous lesion. Sinuses/Orbits: Paranasal sinuses and mastoid air cells are clear. Unremarkable orbits. Other: None. IMPRESSION: 1. No evidence of acute intracranial abnormality. 2. Stable chronic findings as above. Electronically Signed   By: Sebastian Ache M.D.   On: 10/22/2020 17:40   DG Chest Portable 1 View  Result Date: 10/22/2020 CLINICAL DATA:  Altered mental status, possible seizure. EXAM: PORTABLE CHEST 1 VIEW COMPARISON:  09/06/2020 FINDINGS: The left hemidiaphragm is omitted. The remainder of the lungs appears clear. The patient is rotated to the right on today's radiograph, reducing diagnostic sensitivity and specificity. Upper normal heart size. IMPRESSION: 1. No significant abnormality identified. Please note that the left lung base is omitted. Electronically Signed   By: Gaylyn Rong M.D.   On: 10/22/2020 15:06     Kathleene Bergemann T. Greycen Felter [facility]  If 7PM-7AM, please contact night-coverage [email or web address] 10/23/2020, 1:24 PM

## 2020-10-23 NOTE — Progress Notes (Signed)
Paged on call MD RE: pt minimally responsive, apneic episodes with desaturation.  MD ordered ABG.

## 2020-10-23 NOTE — TOC Initial Note (Signed)
Transition of Care [facility]) - Initial/Assessment Note    Patient Details  Name: Carolyn Norton MRN: [id number] Date of Birth: 1966/07/15  Transition of [facility]) CM/SW Contact:    Golda Acre, RN Phone Number: 10/23/2020, 8:41 AM  Clinical Narrative:                 55 y.o. female with medical history significant for type 2 diabetes, bipolar type I, hypertension, recent Covid infection, and polysubstance abuse who presents due to concerns of seizure-like activity.  Patient lethargic and thrashing in bed during evaluation unable to provide history.  However she was able to tell me her name and that she was in [place].  History is obtained from ED physician and ED documentation.  Reportedly, patient went out last night and came home and was found to have seizure-like activity. Her mother is unclear whether she used cocaine but has history of use.  Family also unsure whether it could have been due to her sugar levels and whether she took insulin for the day.  Patient was given IM 5 mg of Versed by EMS.  In the ED she was slightly hypotensive at times down to 90s over 50s and was placed on 2 L via nasal cannula without documented hypoxia.  At bedside noted to have some brief apneic-like episodes which appears to be undiagnosed sleep apnea. Otherwise protecting her airway.   CBC shows leukocytosis of 18.7.  Initial lactate of 2.6 down to 1.6.  AKI with creatinine of 3.42 from a prior 1.14.  Anion gap of 16.  pH of 7.4 on VBG with bicarb of 28.  CK of 303.  Patient was started on IV antibiotics for presumed meningitis given her altered mental state prior to UDS result.  No CSF was unable to obtain due to patient's agitation PLAN: unable to determine at this time, lives alone from home. Expected Discharge Plan: Home/Self Care Barriers to Discharge: Continued Medical Work up   Patient Goals and CMS Choice Patient states their goals for this hospitalization and ongoing recovery  are:: unable to state CMS Medicare.gov Compare Post Acute Care list provided to:: Patient    Expected Discharge Plan and Services Expected Discharge Plan: Home/Self Care   Discharge Planning Services: CM Consult   Living arrangements for the past 2 months: Single Family Home                                      Prior Living Arrangements/Services Living arrangements for the past 2 months: Single Family Home Lives with:: Self Patient language and need for interpreter reviewed:: Yes        Need for Family Participation in Patient Care: Yes (Comment) (mother) Care giver support system in place?: Yes (comment)   Criminal Activity/Legal Involvement Pertinent to Current Situation/Hospitalization: No - Comment as needed  Activities of Daily Living Home Assistive Devices/Equipment: Other (Comment) (pt unable to verbalize what medical equipment she uses) ADL Screening (condition at time of admission) Patient's cognitive ability adequate to safely complete daily activities?: No Is the patient deaf or have difficulty hearing?: No Does the patient have difficulty seeing, even when wearing glasses/contacts?: No Does the patient have difficulty concentrating, remembering, or making decisions?: Yes Patient able to express need for assistance with ADLs?: No Does the patient have difficulty dressing or bathing?: Yes Independently performs ADLs?: No Communication: Independent Dressing (OT): Needs assistance Is this a  change from baseline?: Change from baseline, expected to last >3 days Grooming: Needs assistance Is this a change from baseline?: Change from baseline, expected to last >3 days Feeding: Needs assistance Is this a change from baseline?: Change from baseline, expected to last >3 days Bathing: Needs assistance Is this a change from baseline?: Change from baseline, expected to last >3 days Toileting: Needs assistance Is this a change from baseline?: Change from baseline,  expected to last >3days In/Out Bed: Needs assistance Is this a change from baseline?: Change from baseline, expected to last >3 days Walks in Home: Needs assistance Is this a change from baseline?: Change from baseline, expected to last >3 days Does the patient have difficulty walking or climbing stairs?: Yes Weakness of Legs: Both Weakness of Arms/Hands: Both  Permission Sought/Granted                  Emotional Assessment Appearance:: Appears stated age Attitude/Demeanor/Rapport: Unable to Assess Affect (typically observed): Unable to Assess Orientation: : Fluctuating Orientation (Suspected and/or reported Sundowners) Alcohol / Substance Use: Illicit Drugs Psych Involvement: No (comment)  Admission diagnosis:  Delirium [R41.0] AKI (acute kidney injury) (HCC) [N17.9] Acute metabolic encephalopathy [G93.41] Patient Active Problem List   Diagnosis Date Noted  . Acute metabolic encephalopathy 10/22/2020  . Hypotension 10/22/2020  . Leukocytosis 10/22/2020  . Type 2 diabetes mellitus (HCC) 10/22/2020  . Altered mental status   . Hyperosmolar hyperglycemic state (HHS) (HCC)   . Hyperglycemia   . Hyperkalemia   . AKI (acute kidney injury) (HCC)   . Essential hypertension   . DKA (diabetic ketoacidosis) (HCC) 09/06/2020  . COVID-19 09/06/2020  . Bipolar 1 disorder (HCC) 09/06/2020  . Breast mass status post excision 09/06/2020  . Smoker 09/06/2020  . Metabolic acidosis due to ingestion of drugs or chemicals 09/06/2020   PCP:  Claiborne Rigg, NP Pharmacy:   [facility] [address] [place] [address] Phone: [phone] Fax: [phone]  [facility] - [place], [place] - [address] [place] [address] Phone: [phone] Fax: [phone]  [facility] [address], [place] - [address] [place] [address] Phone: [phone]  Fax: [phone]     Social Determinants of Health (SDOH) Interventions    Readmission Risk Interventions No flowsheet data found.

## 2020-10-23 NOTE — Evaluation (Signed)
SLP Cancellation Note  Patient Details Name: Carolyn Norton MRN: [id number] DOB: 1966/08/13   Cancelled treatment:       Reason Eval/Treat Not Completed: Other (comment) (pt lethargic today, will see in am next date)  Rolena Infante, MS [facility] SLP Acute Rehab Services Office [phone] Pager [phone]    Chales Abrahams 10/23/2020, 3:54 PM

## 2020-10-24 LAB — CK: Total CK: 179 U/L (ref 38–234)

## 2020-10-24 LAB — CBC
HCT: 40.7 % (ref 36.0–46.0)
Hemoglobin: 12.9 g/dL (ref 12.0–15.0)
MCH: 28.1 pg (ref 26.0–34.0)
MCHC: 31.7 g/dL (ref 30.0–36.0)
MCV: 88.7 fL (ref 80.0–100.0)
Platelets: 156 10*3/uL (ref 150–400)
RBC: 4.59 MIL/uL (ref 3.87–5.11)
RDW: 14.4 % (ref 11.5–15.5)
WBC: 10.5 10*3/uL (ref 4.0–10.5)
nRBC: 0 % (ref 0.0–0.2)

## 2020-10-24 LAB — RENAL FUNCTION PANEL
Albumin: 3 g/dL — ABNORMAL LOW (ref 3.5–5.0)
Anion gap: 12 (ref 5–15)
BUN: 32 mg/dL — ABNORMAL HIGH (ref 6–20)
CO2: 28 mmol/L (ref 22–32)
Calcium: 8.9 mg/dL (ref 8.9–10.3)
Chloride: 101 mmol/L (ref 98–111)
Creatinine, Ser: 1.8 mg/dL — ABNORMAL HIGH (ref 0.44–1.00)
GFR, Estimated: 33 mL/min — ABNORMAL LOW (ref >60)
Glucose, Bld: 139 mg/dL — ABNORMAL HIGH (ref 70–99)
Phosphorus: 2.5 mg/dL (ref 2.5–4.6)
Potassium: 4.7 mmol/L (ref 3.5–5.1)
Sodium: 141 mmol/L (ref 135–145)

## 2020-10-24 LAB — GLUCOSE, CAPILLARY
Glucose-Capillary: 124 mg/dL — ABNORMAL HIGH (ref 70–99)
Glucose-Capillary: 169 mg/dL — ABNORMAL HIGH (ref 70–99)
Glucose-Capillary: 288 mg/dL — ABNORMAL HIGH (ref 70–99)
Glucose-Capillary: 181 mg/dL — ABNORMAL HIGH (ref 70–99)
Glucose-Capillary: 126 mg/dL — ABNORMAL HIGH (ref 70–99)

## 2020-10-24 LAB — URINE CULTURE: Culture: <10000 — AB

## 2020-10-24 LAB — HEMOGLOBIN A1C
Hgb A1c MFr Bld: 11.6 % — ABNORMAL HIGH (ref 4.8–5.6)
Mean Plasma Glucose: 286 mg/dL

## 2020-10-24 LAB — MAGNESIUM: Magnesium: 1.7 mg/dL (ref 1.7–2.4)

## 2020-10-24 LAB — PROCALCITONIN: Procalcitonin: 1.01 ng/mL

## 2020-10-24 MED ORDER — MOMETASONE FURO-FORMOTEROL FUM 200-5 MCG/ACT IN AERO
2.0000 | INHALATION_SPRAY | Freq: Two times a day (BID) | RESPIRATORY_TRACT | Status: DC
  Administered 2020-10-26 – 2020-10-27 (×3): 2 via RESPIRATORY_TRACT
  Filled 2020-10-24 (×2): qty 8.8

## 2020-10-24 MED ORDER — OXYBUTYNIN CHLORIDE 5 MG PO TABS
5.0000 mg | ORAL_TABLET | Freq: Two times a day (BID) | ORAL | Status: DC
  Administered 2020-10-24 – 2020-10-27 (×6): 5 mg via ORAL
  Filled 2020-10-24 (×6): qty 1

## 2020-10-24 MED ORDER — OXYCODONE HCL 5 MG PO TABS
5.0000 mg | ORAL_TABLET | Freq: Once | ORAL | Status: AC
  Administered 2020-10-24: 5 mg via ORAL
  Filled 2020-10-24: qty 1

## 2020-10-24 MED ORDER — ORAL CARE MOUTH RINSE
15.0000 mL | Freq: Two times a day (BID) | OROMUCOSAL | Status: DC
  Administered 2020-10-24 – 2020-10-26 (×6): 15 mL via OROMUCOSAL

## 2020-10-24 MED ORDER — OXYBUTYNIN CHLORIDE 5 MG PO TABS: 5.0000 mg | ORAL_TABLET | ORAL | Status: DC

## 2020-10-24 MED ORDER — ESCITALOPRAM OXALATE 20 MG PO TABS
20.0000 mg | ORAL_TABLET | Freq: Every day | ORAL | Status: DC
  Administered 2020-10-24 – 2020-10-27 (×4): 20 mg via ORAL
  Filled 2020-10-24 (×3): qty 1

## 2020-10-24 MED ORDER — ENOXAPARIN SODIUM 40 MG/0.4ML ~~LOC~~ SOLN
40.0000 mg | SUBCUTANEOUS | Status: DC
  Administered 2020-10-24 – 2020-10-26 (×3): 40 mg via SUBCUTANEOUS
  Filled 2020-10-24 (×3): qty 0.4

## 2020-10-24 MED ORDER — GABAPENTIN 300 MG PO CAPS: 300.0000 mg | ORAL_CAPSULE | Freq: Two times a day (BID) | ORAL | Status: DC

## 2020-10-24 MED ORDER — INSULIN ASPART 100 UNIT/ML ~~LOC~~ SOLN
0.0000 [IU] | Freq: Every day | SUBCUTANEOUS | Status: DC
  Administered 2020-10-25 – 2020-10-26 (×2): 2 [IU] via SUBCUTANEOUS

## 2020-10-24 MED ORDER — DIVALPROEX SODIUM 250 MG PO DR TAB
500.0000 mg | DELAYED_RELEASE_TABLET | Freq: Two times a day (BID) | ORAL | Status: DC
  Administered 2020-10-24 – 2020-10-27 (×6): 500 mg via ORAL
  Filled 2020-10-24 (×5): qty 2
  Filled 2020-10-24: qty 1
  Filled 2020-10-24: qty 2

## 2020-10-24 MED ORDER — GABAPENTIN 100 MG PO CAPS
200.0000 mg | ORAL_CAPSULE | Freq: Two times a day (BID) | ORAL | Status: DC
  Administered 2020-10-24 – 2020-10-27 (×6): 200 mg via ORAL
  Filled 2020-10-24 (×7): qty 2

## 2020-10-24 MED ORDER — INSULIN ASPART 100 UNIT/ML ~~LOC~~ SOLN
3.0000 [IU] | Freq: Three times a day (TID) | SUBCUTANEOUS | Status: DC
  Administered 2020-10-24: 3 [IU] via SUBCUTANEOUS

## 2020-10-24 MED ORDER — LORAZEPAM 2 MG/ML IJ SOLN
0.5000 mg | Freq: Four times a day (QID) | INTRAMUSCULAR | Status: DC | PRN
  Administered 2020-10-24 – 2020-10-25 (×3): 0.5 mg via INTRAVENOUS
  Filled 2020-10-24 (×3): qty 1

## 2020-10-24 MED ORDER — OXYBUTYNIN CHLORIDE 5 MG PO TABS: 5.0000 mg | ORAL_TABLET | Freq: Four times a day (QID) | ORAL | Status: DC | PRN

## 2020-10-24 MED ORDER — INSULIN ASPART 100 UNIT/ML ~~LOC~~ SOLN
4.0000 [IU] | Freq: Three times a day (TID) | SUBCUTANEOUS | Status: DC
  Administered 2020-10-24 – 2020-10-27 (×8): 4 [IU] via SUBCUTANEOUS

## 2020-10-24 MED ORDER — INSULIN GLARGINE 100 UNIT/ML ~~LOC~~ SOLN
15.0000 [IU] | Freq: Two times a day (BID) | SUBCUTANEOUS | Status: DC
  Administered 2020-10-24 – 2020-10-27 (×6): 15 [IU] via SUBCUTANEOUS
  Filled 2020-10-24 (×9): qty 0.15

## 2020-10-24 MED ORDER — INSULIN ASPART 100 UNIT/ML ~~LOC~~ SOLN
0.0000 [IU] | Freq: Three times a day (TID) | SUBCUTANEOUS | Status: DC
  Administered 2020-10-24: 2 [IU] via SUBCUTANEOUS
  Administered 2020-10-24: 5 [IU] via SUBCUTANEOUS
  Administered 2020-10-25: 3 [IU] via SUBCUTANEOUS
  Administered 2020-10-25: 1 [IU] via SUBCUTANEOUS
  Administered 2020-10-26: 2 [IU] via SUBCUTANEOUS
  Administered 2020-10-26: 5 [IU] via SUBCUTANEOUS
  Administered 2020-10-26: 3 [IU] via SUBCUTANEOUS
  Administered 2020-10-27: 5 [IU] via SUBCUTANEOUS
  Administered 2020-10-27: 2 [IU] via SUBCUTANEOUS

## 2020-10-24 MED ORDER — MONTELUKAST SODIUM 10 MG PO TABS
10.0000 mg | ORAL_TABLET | Freq: Every day | ORAL | Status: DC
  Administered 2020-10-24 – 2020-10-26 (×3): 10 mg via ORAL
  Filled 2020-10-24 (×3): qty 1

## 2020-10-24 MED ORDER — MAGNESIUM SULFATE 2 GM/50ML IV SOLN
2.0000 g | Freq: Once | INTRAVENOUS | Status: AC
  Administered 2020-10-24: 2 g via INTRAVENOUS
  Filled 2020-10-24: qty 50

## 2020-10-24 MED ORDER — ALBUTEROL SULFATE HFA 108 (90 BASE) MCG/ACT IN AERS
2.0000 | INHALATION_SPRAY | RESPIRATORY_TRACT | Status: DC | PRN
  Administered 2020-10-26: 2 via RESPIRATORY_TRACT
  Filled 2020-10-24: qty 6.7

## 2020-10-24 NOTE — Progress Notes (Signed)
PROGRESS NOTE  Carolyn Norton WSF:[id number] DOB: 1966/03/23   PCP: Claiborne Rigg, NP  Patient is from: Home  DOA: 10/22/2020 LOS: 2  Chief complaints: Seizure-like activity  Brief Narrative / Interim history: 55 year old F with PMH of DM-2, BPD type I, HTN, recent COVID-19 infection and polysubstance use presenting with "seizure-like activity" and admitted for acute metabolic encephalopathy and AKI likely due to cocaine use.  UDS was positive for cocaine.  Encephalopathy thought to be due to cocaine abuse, and improving.  Subjective: Seen and examined earlier this morning.  No major events overnight of this morning.  Still restless but more awake and alert.  She is oriented to self, place and month.  No complaints other than RUE pain from a wrap to protect her PIV.  Denies headache, vision change, chest pain, dyspnea, GI or UTI symptoms.  Objective: Vitals:   10/24/20 0400 10/24/20 0530 10/24/20 0800 10/24/20 1149  BP: (!) 166/86 (!) 161/75 (!) 156/94   Pulse: 71 68 63   Resp: 16 18 18    Temp: 97.8 F (36.6 C)  98 F (36.7 C) 97.9 F (36.6 C)  TempSrc: Oral  Oral Oral  SpO2: 99% 99% 99%   Weight:      Height:        Intake/Output Summary (Last 24 hours) at 10/24/2020 1341 Last data filed at 10/24/2020 1300 Gross per 24 hour  Intake 2870.12 ml  Output 250 ml  Net 2620.12 ml   Filed Weights   10/22/20 2100  Weight: 85.5 kg    Examination:  GENERAL: No apparent distress.  Restless in bed. HEENT: MMM.  Vision and hearing grossly intact.  NECK: Supple.  No apparent JVD.  RESP: On RA.  No IWOB.  Fair aeration bilaterally. CVS:  RRR. Heart sounds normal.  ABD/GI/GU: BS+. Abd soft, NTND.  MSK/EXT:  Moves extremities. No apparent deformity. No edema.  SKIN: no apparent skin lesion or wound NEURO: Awake, alert and oriented to self, place and months.  Follows commands.  No apparent focal neuro deficit. PSYCH: Somewhat restless in bed.  Procedures:   None  Microbiology summarized: COVID-19 and influenza PCR nonreactive.  Assessment & Plan: Acute toxic encephalopathy likely due to cocaine abuse.  Some concern about seizure-like activity but no witnessed seizure since arrival.  No focal neuro symptoms.  Low suspicion for meningitis clinically.  Encephalopathy improving but restless.  Oriented to self, place and month.  Follows commands appropriately. -Continue one-to-one 2101 and seizure precautions -Fall and aspiration precautions -PT/OT eval -Cleared by SLP-start regular diet.  Cocaine abuse -Will counsel once able to comprehend well -TOC consult for resources and counseling  Hypotension: Likely dehydration.  Hypotension resolved. -Continue holding home antihypertensive meds -Continue IV fluids  AKI/azotemia on CKD-3A: Likely a mix of prerenal from dehydration and ATN from cocaine use Recent Labs    09/06/20 1031 09/06/20 1229 09/06/20 1900 09/06/20 1948 09/07/20 0029 09/07/20 0616 09/08/20 0236 10/22/20 1522 10/23/20 0313 10/24/20 0303  BUN 58* 57* 49* 47* 43* 41* 32* 46* 40* 32*  CREATININE 2.94* 2.98* 2.13* 2.00* 1.65* 1.44* 1.14* 3.42* 2.59* 1.80*  -Continue IV fluid. -Hold home lisinopril  Anion gap metabolic acidosis: Likely due to azotemia and AKI.  Resolved.  Hyperkalemia: Resolved.  Could be due to acidosis.  Uncontrolled IDDM-2: A1c 11.6%.  Likely noncompliant with meds. Recent Labs  Lab 10/23/20 1955 10/23/20 2322 10/24/20 0334 10/24/20 0743 10/24/20 1144  GLUCAP 120* 118* 124* 169* 288*  -Start SSI with mealtime  coverage -NovoLog AC 4 units -Lantus 15 units twice daily  Leukocytosis/bandemia: Likely demargination.  Resolved quickly.   Bipolar disorder type I -Continue home Depakote and Lexapro Body mass index is 30.42 kg/m.         DVT prophylaxis:  enoxaparin (LOVENOX) injection 40 mg Start: 10/24/20 2200  Code Status: Full code Family Communication: Patient and/or RN.  Available if any question.  Level of care: Med-Surg Status is: Inpatient  Remains inpatient appropriate because:Altered mental status, Unsafe d/c plan, IV treatments appropriate due to intensity of illness or inability to take PO and Inpatient level of care appropriate due to severity of illness   Dispo: The patient is from: Home              Anticipated d/c is to: Home              Anticipated d/c date is: 2 days              Patient currently is not medically stable to d/c.   Difficult to place patient No       Consultants:  None   Sch Meds:  Scheduled Meds: . Chlorhexidine Gluconate Cloth  6 each Topical Daily  . enoxaparin (LOVENOX) injection  40 mg Subcutaneous Q24H  . insulin aspart  0-5 Units Subcutaneous QHS  . insulin aspart  0-9 Units Subcutaneous TID WC  . insulin aspart  3 Units Subcutaneous TID WC   Continuous Infusions: . lactated ringers 100 mL/hr at 10/24/20 1300   PRN Meds:.  Antimicrobials: Anti-infectives (From admission, onward)   Start     Dose/Rate Route Frequency Ordered Stop   10/22/20 1721  ampicillin (OMNIPEN) 2 g in sodium chloride 0.9 % 100 mL IVPB  Status:  Discontinued        2 g 300 mL/hr over 20 Minutes Intravenous  Once 10/22/20 1705 10/22/20 2106   10/22/20 1715  cefTRIAXone (ROCEPHIN) 2 g in sodium chloride 0.9 % 100 mL IVPB        2 g 200 mL/hr over 30 Minutes Intravenous  Once 10/22/20 1705 10/22/20 1909   10/22/20 1715  vancomycin (VANCOCIN) IVPB 1000 mg/200 mL premix  Status:  Discontinued        1,000 mg 200 mL/hr over 60 Minutes Intravenous  Once 10/22/20 1705 10/22/20 2106       I have personally reviewed the following labs and images: CBC: Recent Labs  Lab 10/22/20 1522 10/23/20 0313 10/24/20 0303  WBC 18.7* 10.9* 10.5  NEUTROABS 12.1*  --   --   HGB 13.0 14.0 12.9  HCT 38.2 43.4 40.7  MCV 83.2 88.4 88.7  PLT 229 175 156   BMP &GFR Recent Labs  Lab 10/22/20 1522 10/23/20 0313 10/24/20 0303  NA 139 139  141  K 4.0 5.4* 4.7  CL 96* 99 101  CO2 27 27 28   GLUCOSE 123* 121* 139*  BUN 46* 40* 32*  CREATININE 3.42* 2.59* 1.80*  CALCIUM 10.3 9.3 8.9  MG  --   --  1.7  PHOS  --   --  2.5   Estimated Creatinine Clearance: 39.8 mL/min (A) (by C-G formula based on SCr of 1.8 mg/dL (H)). Liver & Pancreas: Recent Labs  Lab 10/22/20 1522 10/23/20 0313 10/24/20 0303  AST 16 25  --   ALT 12 13  --   ALKPHOS 69 57  --   BILITOT 0.6 0.5  --   PROT 8.0 7.7  --   ALBUMIN 4.2  3.6 3.0*   No results for input(s): LIPASE, AMYLASE in the last 168 hours. No results for input(s): AMMONIA in the last 168 hours. Diabetic: Recent Labs    10/22/20 2100  HGBA1C 11.6*   Recent Labs  Lab 10/23/20 1955 10/23/20 2322 10/24/20 0334 10/24/20 0743 10/24/20 1144  GLUCAP 120* 118* 124* 169* 288*   Cardiac Enzymes: Recent Labs  Lab 10/22/20 1630 10/24/20 0303  CKTOTAL 303* 179   No results for input(s): PROBNP in the last 8760 hours. Coagulation Profile: No results for input(s): INR, PROTIME in the last 168 hours. Thyroid Function Tests: No results for input(s): TSH, T4TOTAL, FREET4, T3FREE, THYROIDAB in the last 72 hours. Lipid Profile: No results for input(s): CHOL, HDL, LDLCALC, TRIG, CHOLHDL, LDLDIRECT in the last 72 hours. Anemia Panel: No results for input(s): VITAMINB12, FOLATE, FERRITIN, TIBC, IRON, RETICCTPCT in the last 72 hours. Urine analysis:    Component Value Date/Time   COLORURINE YELLOW 10/22/2020 1817   APPEARANCEUR HAZY (A) 10/22/2020 1817   LABSPEC 1.025 10/22/2020 1817   PHURINE 5.0 10/22/2020 1817   GLUCOSEU 150 (A) 10/22/2020 1817   HGBUR MODERATE (A) 10/22/2020 1817   BILIRUBINUR NEGATIVE 10/22/2020 1817   KETONESUR 5 (A) 10/22/2020 1817   PROTEINUR 30 (A) 10/22/2020 1817   UROBILINOGEN 0.2 07/20/2014 2022   NITRITE NEGATIVE 10/22/2020 1817   LEUKOCYTESUR NEGATIVE 10/22/2020 1817   Sepsis Labs: Invalid input(s): PROCALCITONIN,  LACTICIDVEN  Microbiology: Recent Results (from the past 240 hour(s))  Urine culture     Status: Abnormal   Collection Time: 10/22/20  6:17 PM   Specimen: Urine, Random  Result Value Ref Range Status   Specimen Description   Final    URINE, RANDOM Performed at [facility], [address] [place], [place] [address]    Special Requests   Final    NONE Performed at [facility], [address] [place], [place] [address]    Culture (A)  Final    <10,000 COLONIES/mL INSIGNIFICANT GROWTH Performed at [facility], [address] [place], [place] [address]    Report Status 10/24/2020 FINAL  Final  Culture, blood (routine x 2)     Status: None (Preliminary result)   Collection Time: 10/22/20  6:17 PM   Specimen: BLOOD  Result Value Ref Range Status   Specimen Description   Final    BLOOD RIGHT ANTECUBITAL Performed at [facility], [address] [place], [place] [address]    Special Requests   Final    BOTTLES DRAWN AEROBIC AND ANAEROBIC Blood Culture adequate volume Performed at [facility], [address] [place], [place] [address]    Culture   Final    NO GROWTH 2 DAYS Performed at [facility], [address], [place], [place] [address]    Report Status PENDING  Incomplete  Resp Panel by RT-PCR (Flu A&B, Covid) Nasopharyngeal Swab     Status: None   Collection Time: 10/22/20  8:00 PM   Specimen: Nasopharyngeal Swab; Nasopharyngeal(NP) swabs in vial transport medium  Result Value Ref Range Status   SARS Coronavirus 2 by RT PCR NEGATIVE NEGATIVE Final    Comment: (NOTE) SARS-CoV-2 target nucleic acids are NOT DETECTED.  The SARS-CoV-2 RNA is generally detectable in upper respiratory specimens during the acute phase of infection. The lowest concentration of SARS-CoV-2 viral copies this assay can detect is 138 copies/mL. A negative result does not preclude SARS-Cov-2 infection and  should  not be used as the sole basis for treatment or other patient management decisions. A negative result may occur with  improper specimen collection/handling, submission of specimen other than nasopharyngeal swab, presence of viral mutation(s) within the areas targeted by this assay, and inadequate number of viral copies(<138 copies/mL). A negative result must be combined with clinical observations, patient history, and epidemiological information. The expected result is Negative.  Fact Sheet for Patients:  BloggerCourse.com  Fact Sheet for Healthcare Providers:  SeriousBroker.it  This test is no t yet approved or cleared by the [place] FDA and  has been authorized for detection and/or diagnosis of SARS-CoV-2 by FDA under an Emergency Use Authorization (EUA). This EUA will remain  in effect (meaning this test can be used) for the duration of the COVID-19 declaration under Section 564(b)(1) of the Act, 21 U.S.C.section 360bbb-3(b)(1), unless the authorization is terminated  or revoked sooner.       Influenza A by PCR NEGATIVE NEGATIVE Final   Influenza B by PCR NEGATIVE NEGATIVE Final    Comment: (NOTE) The Xpert Xpress SARS-CoV-2/FLU/RSV plus assay is intended as an aid in the diagnosis of influenza from Nasopharyngeal swab specimens and should not be used as a sole basis for treatment. Nasal washings and aspirates are unacceptable for Xpert Xpress SARS-CoV-2/FLU/RSV testing.  Fact Sheet for Patients: BloggerCourse.com  Fact Sheet for Healthcare Providers: SeriousBroker.it  This test is not yet approved or cleared by the [place] FDA and has been authorized for detection and/or diagnosis of SARS-CoV-2 by FDA under an Emergency Use Authorization (EUA). This EUA will remain in effect (meaning this test can be used) for the duration of the COVID-19 declaration  under Section 564(b)(1) of the Act, 21 U.S.C. section 360bbb-3(b)(1), unless the authorization is terminated or revoked.  Performed at [facility], [address], [place], [place] [address]   Culture, blood (routine x 2)     Status: None (Preliminary result)   Collection Time: 10/23/20  8:27 AM   Specimen: BLOOD  Result Value Ref Range Status   Specimen Description   Final    BLOOD BLOOD LEFT FOREARM Performed at [facility], [address], [place], [place] [address]    Special Requests   Final    BOTTLES DRAWN AEROBIC AND ANAEROBIC Blood Culture adequate volume Performed at [facility], [address], [place], [place] [address]    Culture   Final    NO GROWTH < 24 HOURS Performed at [facility], [address] [place], [place] [address]    Report Status PENDING  Incomplete    Radiology Studies: No results found.   Jaila Schellhorn T. Jaymin Waln [facility]  If 7PM-7AM, please contact night-coverage [email or web address] 10/24/2020, 1:41 PM

## 2020-10-24 NOTE — Evaluation (Signed)
Clinical/Bedside Swallow Evaluation Patient Details  Name: Carolyn Norton MRN: [id number] Date of Birth: Jul 10, 1966  Today's Date: 10/24/2020 Time: SLP Start Time (ACUTE ONLY): 1008 SLP Stop Time (ACUTE ONLY): 1030 SLP Time Calculation (min) (ACUTE ONLY): 22 min  Past Medical History:  Past Medical History:  Diagnosis Date  . Anxiety   . Asthma   . Depression   . Diabetes mellitus without complication (HCC)   . Hyperlipidemia   . Hypertension   . Seasonal allergies    Past Surgical History:  Past Surgical History:  Procedure Laterality Date  . BREAST EXCISIONAL BIOPSY Right   . BREAST SURGERY     right breast   HPI:  Carolyn Norton is a 55 y.o. female with medical history significant for type 2 diabetes, bipolar type I, hypertension, recent Covid infection, and polysubstance abuse who presents due to concerns of seizure-like activity. 10/22/20 CXR indicated No significant abnormality identified. Please note that the left lung base is omitted; CT head on 10/22/20 negative for acute processes.  BSE generated as pt placed NPO d/t lethargy.  Attempted on 10/23/20, but unable to complete d/t decreased mentation/delirium.    Assessment / Plan / Recommendation Clinical Impression  Pt seen for BSE with a normal oropharyngeal swallow noted with all consistencies including thin via cup, puree and solids without apparent difficulty noted as swallow was timely, oral prep/transition appeared adequate and swallow transitioned into pharynx and cleared without s/s of aspiration noted throughout evaluation.  Pt's mentation has been waxing/waning during this hospitalization which could place her at a mild risk for aspiration d/t impulsivity, so ST will f/u x1 during acute stay for aspiration precautions/general swallowing education.  Recommend initiating a regular/thin liquid diet with general swallowing precautions in place.  Thank you for this consult. SLP Visit Diagnosis: Dysphagia,  unspecified (R13.10)    Aspiration Risk  Mild aspiration risk    Diet Recommendation   Regular/thin liquids  Medication Administration: Whole meds with liquid    Other  Recommendations Oral Care Recommendations: Oral care BID   Follow up Recommendations None      Frequency and Duration min 1 x/week  1 week       Prognosis Prognosis for Safe Diet Advancement: Good Barriers to Reach Goals: Behavior      Swallow Study   General Date of Onset: 10/22/20 HPI: Carolyn Norton is a 55 y.o. female with medical history significant for type 2 diabetes, bipolar type I, hypertension, recent Covid infection, and polysubstance abuse who presents due to concerns of seizure-like activity. Type of Study: Bedside Swallow Evaluation Previous Swallow Assessment: n/a Diet Prior to this Study: NPO Temperature Spikes Noted: No Respiratory Status: Room air History of Recent Intubation: No Behavior/Cognition: Alert;Cooperative;Requires cueing Oral Cavity Assessment: Within Functional Limits Oral Care Completed by SLP: Recent completion by staff Oral Cavity - Dentition: Adequate natural dentition Vision: Functional for self-feeding Self-Feeding Abilities: Needs assist Patient Positioning: Upright in bed Baseline Vocal Quality: Low vocal intensity Volitional Cough: Strong Volitional Swallow: Able to elicit    Oral/Motor/Sensory Function Overall Oral Motor/Sensory Function: Within functional limits   Ice Chips Ice chips: Within functional limits Presentation: Spoon   Thin Liquid Thin Liquid: Within functional limits Presentation: Cup    Nectar Thick Nectar Thick Liquid: Not tested   Honey Thick Honey Thick Liquid: Not tested   Puree Puree: Within functional limits Presentation: Spoon   Solid     Solid: Within functional limits Presentation: Self Fed  Tressie Stalker, M.S., CCC-SLP 10/24/2020,11:36 AM

## 2020-10-24 NOTE — Evaluation (Signed)
Physical Therapy Evaluation Patient Details Name: Carolyn Norton MRN: [id number] DOB: 01-04-1966 Today's Date: 10/24/2020   History of Present Illness  55 year old F with PMH of DM-2, BPD type I, HTN, recent COVID-19 infection and polysubstance use presenting with "seizure-like activity" and admitted for acute metabolic encephalopathy and AKI likely due to cocaine use.  UDS was positive for cocaine.  Encephalopathy thought to be due to cocaine abuse, and improving.  Clinical Impression  Patient presents with decreased mobility due to poor safety awareness, lethargy, decreased balance, decreased awareness and attention, and generalized weakness.  She is currently high risk for falls even today despite assist and RW.  Feel she may progress during acute stay with improved mentation and when alert.  PT to follow during acute stay.  If not progressing closer to d/c may need post acute inpatient rehab.     Follow Up Recommendations Supervision/Assistance - 24 hour;Home health PT    Equipment Recommendations  Rolling walker with 5" wheels    Recommendations for Other Services       Precautions / Restrictions Precautions Precautions: Fall Precaution Comments: seizure      Mobility  Bed Mobility Overal bed mobility: Needs Assistance Bed Mobility: Supine to Sit;Sit to Supine     Supine to sit: Mod assist;HOB elevated;Max assist Sit to supine: Mod assist   General bed mobility comments: Patient laying crooked in the bed and legs off edge, but when trying to sit up pulled legs in the bed so had to assist legs off bed and help to lift trunk, to supine assist for legs only    Transfers Overall transfer level: Needs assistance Equipment used: 1 person hand held assist;Rolling walker (2 wheeled) Transfers: Sit to/from [employer] Sit to Stand: Mod assist Stand pivot transfers: Mod assist       General transfer comment: lifting help to stand, heavy mod; to [facility]  assist for balance and safety  Ambulation/Gait Ambulation/Gait assistance: Mod assist;+2 safety/equipment Gait Distance (Feet): 40 Feet Assistive device: Rolling walker (2 wheeled) Gait Pattern/deviations: Step-to pattern;Wide base of support;Trunk flexed;Shuffle;Decreased stride length     General Gait Details: Initially with 1 HHA, but not enough support so obtained RW and pt needed A due to pushing walker too far out and taking small steps, sitter assisted to push IV pole.  Stairs            Wheelchair Mobility    Modified Rankin (Stroke Patients Only)       Balance Overall balance assessment: Needs assistance   Sitting balance-Leahy Scale: Fair     Standing balance support: Single extremity supported;Bilateral upper extremity supported Standing balance-Leahy Scale: Poor Standing balance comment: grabbing onto anything to have UE support                             Pertinent Vitals/Pain Pain Assessment: Faces Faces Pain Scale: Hurts little more Pain Location: R arm, IV site Pain Descriptors / Indicators: Guarding;Tender Pain Intervention(s): Monitored during session    Home Living Family/patient expects to be discharged to:: Private residence Living Arrangements: Parent Available Help at Discharge: Family Type of Home: Apartment Home Access: Level entry     Home Layout: One level   Additional Comments: unknown, pt poor historian    Prior Function                 Hand Dominance        Extremity/Trunk Assessment  Upper Extremity Assessment Upper Extremity Assessment: Generalized weakness    Lower Extremity Assessment Lower Extremity Assessment: Generalized weakness       Communication   Communication: No difficulties  Cognition Arousal/Alertness: Lethargic Behavior During Therapy: Impulsive Overall Cognitive Status: Impaired/Different from baseline Area of Impairment: Orientation;Following commands;Attention;Problem  solving                 Orientation Level: Disoriented to;Situation;Time Current Attention Level: Focused   Following Commands: Follows one step commands inconsistently;Follows one step commands with increased time     Problem Solving: Slow processing        General Comments General comments (skin integrity, edema, etc.): Requested to toilet once back in bed, obtained BSC and assisted pt up and to pivot, but was incontinent of urine.    Exercises     Assessment/Plan    PT Assessment Patient needs continued PT services  PT Problem List Decreased safety awareness;Decreased mobility;Decreased strength;Decreased coordination;Decreased cognition;Decreased activity tolerance;Decreased balance;Decreased knowledge of use of DME       PT Treatment Interventions DME instruction;Therapeutic activities;Gait training;Therapeutic exercise;Patient/family education;Balance training;Functional mobility training    PT Goals (Current goals can be found in the Care Plan section)  Acute Rehab PT Goals Patient Stated Goal: none stated PT Goal Formulation: Patient unable to participate in goal setting Time For Goal Achievement: 11/07/20 Potential to Achieve Goals: Good    Frequency Min 3X/week   Barriers to discharge        Co-evaluation               AM-PAC PT "6 Clicks" Mobility  Outcome Measure Help needed turning from your back to your side while in a flat bed without using bedrails?: A Lot Help needed moving from lying on your back to sitting on the side of a flat bed without using bedrails?: A Lot Help needed moving to and from a bed to a chair (including a wheelchair)?: A Lot Help needed standing up from a chair using your arms (e.g., wheelchair or bedside chair)?: A Lot Help needed to walk in hospital room?: A Lot Help needed climbing 3-5 steps with a railing? : Total 6 Click Score: 11    End of Session   Activity Tolerance: Patient limited by lethargy Patient  left: in bed;with call bell/phone within reach;with nursing/sitter in room   PT Visit Diagnosis: Other abnormalities of gait and mobility (R26.89);Other symptoms and signs involving the nervous system (R29.898)    Time: 3662-9476 PT Time Calculation (min) (ACUTE ONLY): 27 min   Charges:   PT Evaluation $PT Eval Moderate Complexity: 1 Mod PT Treatments $Gait Training: 8-22 mins        Sheran Lawless, PT Acute Rehabilitation Services Pager:[phone] Office:[phone] 10/24/2020   Carolyn Norton 10/24/2020, 4:28 PM

## 2020-10-25 LAB — GLUCOSE, CAPILLARY
Glucose-Capillary: 207 mg/dL — ABNORMAL HIGH (ref 70–99)
Glucose-Capillary: 135 mg/dL — ABNORMAL HIGH (ref 70–99)
Glucose-Capillary: 53 mg/dL — ABNORMAL LOW (ref 70–99)

## 2020-10-25 LAB — RENAL FUNCTION PANEL
Albumin: 3.2 g/dL — ABNORMAL LOW (ref 3.5–5.0)
Anion gap: 10 (ref 5–15)
BUN: 21 mg/dL — ABNORMAL HIGH (ref 6–20)
CO2: 28 mmol/L (ref 22–32)
Calcium: 8.8 mg/dL — ABNORMAL LOW (ref 8.9–10.3)
Chloride: 99 mmol/L (ref 98–111)
Creatinine, Ser: 1.37 mg/dL — ABNORMAL HIGH (ref 0.44–1.00)
GFR, Estimated: 46 mL/min — ABNORMAL LOW (ref >60)
Glucose, Bld: 223 mg/dL — ABNORMAL HIGH (ref 70–99)
Phosphorus: 2.3 mg/dL — ABNORMAL LOW (ref 2.5–4.6)
Potassium: 4.8 mmol/L (ref 3.5–5.1)
Sodium: 137 mmol/L (ref 135–145)

## 2020-10-25 LAB — MAGNESIUM: Magnesium: 1.8 mg/dL (ref 1.7–2.4)

## 2020-10-25 MED ORDER — HYDROXYZINE HCL 25 MG PO TABS: 25.0000 mg | ORAL_TABLET | Freq: Three times a day (TID) | ORAL | Status: DC | PRN

## 2020-10-25 MED ORDER — ATORVASTATIN CALCIUM 20 MG PO TABS
20.0000 mg | ORAL_TABLET | Freq: Every day | ORAL | Status: DC
  Administered 2020-10-25 – 2020-10-27 (×3): 20 mg via ORAL
  Filled 2020-10-25 (×3): qty 1

## 2020-10-25 NOTE — Progress Notes (Signed)
PROGRESS NOTE  Carolyn Norton MPN:[id number] DOB: October 05, 1965   PCP: Claiborne Rigg, NP  Patient is from: Home  DOA: 10/22/2020 LOS: 3  Chief complaints: Seizure-like activity  Brief Narrative / Interim history: 55 year old F with PMH of DM-2, BPD type I, HTN, recent COVID-19 infection and polysubstance use presenting with "seizure-like activity" and admitted for acute metabolic encephalopathy and AKI likely due to cocaine use.  UDS was positive for cocaine.  Encephalopathy thought to be due to cocaine abuse, and improving.  Therapy recommended HH with 24-hour 7 supervision or SNF.  Subjective: Seen and examined earlier this morning.  No major events overnight of this morning.  Reports pain all over.  She also reports yes to all review of system.  She is oriented to self and place but not time.  Somewhat restless.   Objective: Vitals:   10/24/20 1811 10/24/20 2157 10/25/20 0212 10/25/20 0604  BP: 140/82 (!) 167/78 139/85 (!) 146/61  Pulse: 77 68 (!) 55 (!) 56  Resp: 18 18 14 14   Temp: 98.1 F (36.7 C) 98.2 F (36.8 C) (!) 97.5 F (36.4 C) (!) 97.4 F (36.3 C)  TempSrc: Oral Oral Oral Oral  SpO2: 97% 94% 96% 100%  Weight:      Height:        Intake/Output Summary (Last 24 hours) at 10/25/2020 1326 Last data filed at 10/25/2020 0930 Gross per 24 hour  Intake 600 ml  Output 50 ml  Net 550 ml   Filed Weights   10/22/20 2100  Weight: 85.5 kg    Examination:  GENERAL: No apparent distress.  Nontoxic. HEENT: MMM.  Vision and hearing grossly intact.  NECK: Supple.  No apparent JVD.  RESP: On RA.  No IWOB.  Fair aeration bilaterally. CVS:  RRR. Heart sounds normal.  ABD/GI/GU: BS+. Abd soft, NTND.  MSK/EXT:  Moves extremities. No apparent deformity. No edema.  SKIN: no apparent skin lesion or wound NEURO: Awake and alert.  Oriented to self and place.  Somewhat restless.  No apparent focal neuro deficit. PSYCH: Somewhat restless but no distress or  agitation.  Procedures:  None  Microbiology summarized: COVID-19 and influenza PCR nonreactive.  Assessment & Plan: Acute toxic encephalopathy likely due to cocaine abuse.  Some concern about seizure-like activity but no witnessed seizure since arrival.  No focal neuro symptoms. Encephalopathy improving but restless.  Oriented to self and place.  Somewhat restless.  No great insight. -Continue fall and seizure precautions -Cleared by SLP-start regular diet. -Appreciate PT/OT  Cocaine abuse: Advised to stop using cocaine but had no recollection of our discussion yesterday. -TOC consult for resources and counseling  Hypotension: Likely dehydration.  Resolved. -Continue holding home antihypertensive meds -Discontinue IV fluids  AKI/azotemia on CKD-3A: Likely a mix of prerenal from dehydration and ATN from cocaine use Recent Labs    09/06/20 1229 09/06/20 1900 09/06/20 1948 09/07/20 0029 09/07/20 0616 09/08/20 0236 10/22/20 1522 10/23/20 0313 10/24/20 0303 10/25/20 0527  BUN 57* 49* 47* 43* 41* 32* 46* 40* 32* 21*  CREATININE 2.98* 2.13* 2.00* 1.65* 1.44* 1.14* 3.42* 2.59* 1.80* 1.37*  -Discontinue IV fluids -Continue holding home lisinopril  Anion gap metabolic acidosis: Likely due to azotemia and AKI.  Resolved.  Hyperkalemia: Resolved.  Could be due to acidosis.  Uncontrolled IDDM-2: A1c 11.6%.  Likely noncompliant with meds. Recent Labs  Lab 10/24/20 1144 10/24/20 1551 10/24/20 2152 10/25/20 0732 10/25/20 1219  GLUCAP 288* 181* 126* 207* 135*  -Continue SSI-sensitive -Continue NovoLog AC  4 units -Lantus 15 units twice daily  Leukocytosis/bandemia: Likely demargination.  Resolved.  Bipolar disorder type I -Continue home Depakote and Lexapro-but not sure the last time she took those medications Body mass index is 30.42 kg/m.         DVT prophylaxis:  enoxaparin (LOVENOX) injection 40 mg Start: 10/24/20 2200  Code Status: Full code Family  Communication: Updated patient's mother over the phone with patient's permission Level of care: Med-Surg Status is: Inpatient  Remains inpatient appropriate because:Altered mental status, Unsafe d/c plan, IV treatments appropriate due to intensity of illness or inability to take PO and Inpatient level of care appropriate due to severity of illness   Dispo: The patient is from: Home              Anticipated d/c is to: Home if family able to provide 24/7 support              Anticipated d/c date is: 1 day              Patient currently is not medically stable to d/c.   Difficult to place patient No       Consultants:  None   Sch Meds:  Scheduled Meds: . atorvastatin  20 mg Oral Daily  . Chlorhexidine Gluconate Cloth  6 each Topical Daily  . divalproex  500 mg Oral BID  . enoxaparin (LOVENOX) injection  40 mg Subcutaneous Q24H  . escitalopram  20 mg Oral Daily  . gabapentin  200 mg Oral BID  . insulin aspart  0-5 Units Subcutaneous QHS  . insulin aspart  0-9 Units Subcutaneous TID WC  . insulin aspart  4 Units Subcutaneous TID WC  . insulin glargine  15 Units Subcutaneous BID  . mouth rinse  15 mL Mouth Rinse BID  . mometasone-formoterol  2 puff Inhalation BID  . montelukast  10 mg Oral QHS  . oxybutynin  5 mg Oral BID   Continuous Infusions: . lactated ringers 100 mL/hr at 10/24/20 1509   PRN Meds:.  Antimicrobials: Anti-infectives (From admission, onward)   Start     Dose/Rate Route Frequency Ordered Stop   10/22/20 1721  ampicillin (OMNIPEN) 2 g in sodium chloride 0.9 % 100 mL IVPB  Status:  Discontinued        2 g 300 mL/hr over 20 Minutes Intravenous  Once 10/22/20 1705 10/22/20 2106   10/22/20 1715  cefTRIAXone (ROCEPHIN) 2 g in sodium chloride 0.9 % 100 mL IVPB        2 g 200 mL/hr over 30 Minutes Intravenous  Once 10/22/20 1705 10/22/20 1909   10/22/20 1715  vancomycin (VANCOCIN) IVPB 1000 mg/200 mL premix  Status:  Discontinued        1,000 mg 200 mL/hr  over 60 Minutes Intravenous  Once 10/22/20 1705 10/22/20 2106       I have personally reviewed the following labs and images: CBC: Recent Labs  Lab 10/22/20 1522 10/23/20 0313 10/24/20 0303  WBC 18.7* 10.9* 10.5  NEUTROABS 12.1*  --   --   HGB 13.0 14.0 12.9  HCT 38.2 43.4 40.7  MCV 83.2 88.4 88.7  PLT 229 175 156   BMP &GFR Recent Labs  Lab 10/22/20 1522 10/23/20 0313 10/24/20 0303 10/25/20 0527  NA 139 139 141 137  K 4.0 5.4* 4.7 4.8  CL 96* 99 101 99  CO2 27 27 28 28   GLUCOSE 123* 121* 139* 223*  BUN 46* 40* 32* 21*  CREATININE  3.42* 2.59* 1.80* 1.37*  CALCIUM 10.3 9.3 8.9 8.8*  MG  --   --  1.7 1.8  PHOS  --   --  2.5 2.3*   Estimated Creatinine Clearance: 52.3 mL/min (A) (by C-G formula based on SCr of 1.37 mg/dL (H)). Liver & Pancreas: Recent Labs  Lab 10/22/20 1522 10/23/20 0313 10/24/20 0303 10/25/20 0527  AST 16 25  --   --   ALT 12 13  --   --   ALKPHOS 69 57  --   --   BILITOT 0.6 0.5  --   --   PROT 8.0 7.7  --   --   ALBUMIN 4.2 3.6 3.0* 3.2*   No results for input(s): LIPASE, AMYLASE in the last 168 hours. No results for input(s): AMMONIA in the last 168 hours. Diabetic: Recent Labs    10/22/20 2100  HGBA1C 11.6*   Recent Labs  Lab 10/24/20 1144 10/24/20 1551 10/24/20 2152 10/25/20 0732 10/25/20 1219  GLUCAP 288* 181* 126* 207* 135*   Cardiac Enzymes: Recent Labs  Lab 10/22/20 1630 10/24/20 0303  CKTOTAL 303* 179   No results for input(s): PROBNP in the last 8760 hours. Coagulation Profile: No results for input(s): INR, PROTIME in the last 168 hours. Thyroid Function Tests: No results for input(s): TSH, T4TOTAL, FREET4, T3FREE, THYROIDAB in the last 72 hours. Lipid Profile: No results for input(s): CHOL, HDL, LDLCALC, TRIG, CHOLHDL, LDLDIRECT in the last 72 hours. Anemia Panel: No results for input(s): VITAMINB12, FOLATE, FERRITIN, TIBC, IRON, RETICCTPCT in the last 72 hours. Urine analysis:    Component Value  Date/Time   COLORURINE YELLOW 10/22/2020 1817   APPEARANCEUR HAZY (A) 10/22/2020 1817   LABSPEC 1.025 10/22/2020 1817   PHURINE 5.0 10/22/2020 1817   GLUCOSEU 150 (A) 10/22/2020 1817   HGBUR MODERATE (A) 10/22/2020 1817   BILIRUBINUR NEGATIVE 10/22/2020 1817   KETONESUR 5 (A) 10/22/2020 1817   PROTEINUR 30 (A) 10/22/2020 1817   UROBILINOGEN 0.2 07/20/2014 2022   NITRITE NEGATIVE 10/22/2020 1817   LEUKOCYTESUR NEGATIVE 10/22/2020 1817   Sepsis Labs: Invalid input(s): PROCALCITONIN, LACTICIDVEN  Microbiology: Recent Results (from the past 240 hour(s))  Urine culture     Status: Abnormal   Collection Time: 10/22/20  6:17 PM   Specimen: Urine, Random  Result Value Ref Range Status   Specimen Description   Final    URINE, RANDOM Performed at [facility], [address] [place], [place] [address]    Special Requests   Final    NONE Performed at [facility], [address], [place], [place] [address]    Culture (A)  Final    <10,000 COLONIES/mL INSIGNIFICANT GROWTH Performed at [facility], [address] [place], [place] [address]    Report Status 10/24/2020 FINAL  Final  Culture, blood (routine x 2)     Status: None (Preliminary result)   Collection Time: 10/22/20  6:17 PM   Specimen: BLOOD  Result Value Ref Range Status   Specimen Description   Final    BLOOD RIGHT ANTECUBITAL Performed at [facility], [address] [place], [place] [address]    Special Requests   Final    BOTTLES DRAWN AEROBIC AND ANAEROBIC Blood Culture adequate volume Performed at [facility], [address] [place], [place] [address]    Culture   Final    NO GROWTH 2 DAYS Performed at [facility], [address]  [place], [place] [address]    Report Status PENDING  Incomplete  Resp Panel by RT-PCR (Flu A&B, Covid) Nasopharyngeal Swab     Status: None   Collection Time: 10/22/20  8:00 PM   Specimen:  Nasopharyngeal Swab; Nasopharyngeal(NP) swabs in vial transport medium  Result Value Ref Range Status   SARS Coronavirus 2 by RT PCR NEGATIVE NEGATIVE Final    Comment: (NOTE) SARS-CoV-2 target nucleic acids are NOT DETECTED.  The SARS-CoV-2 RNA is generally detectable in upper respiratory specimens during the acute phase of infection. The lowest concentration of SARS-CoV-2 viral copies this assay can detect is 138 copies/mL. A negative result does not preclude SARS-Cov-2 infection and should not be used as the sole basis for treatment or other patient management decisions. A negative result may occur with  improper specimen collection/handling, submission of specimen other than nasopharyngeal swab, presence of viral mutation(s) within the areas targeted by this assay, and inadequate number of viral copies(<138 copies/mL). A negative result must be combined with clinical observations, patient history, and epidemiological information. The expected result is Negative.  Fact Sheet for Patients:  BloggerCourse.com  Fact Sheet for Healthcare Providers:  SeriousBroker.it  This test is no t yet approved or cleared by the [place] FDA and  has been authorized for detection and/or diagnosis of SARS-CoV-2 by FDA under an Emergency Use Authorization (EUA). This EUA will remain  in effect (meaning this test can be used) for the duration of the COVID-19 declaration under Section 564(b)(1) of the Act, 21 U.S.C.section 360bbb-3(b)(1), unless the authorization is terminated  or revoked sooner.       Influenza A by PCR NEGATIVE NEGATIVE Final   Influenza B by PCR NEGATIVE NEGATIVE Final    Comment: (NOTE) The Xpert Xpress SARS-CoV-2/FLU/RSV plus assay is intended as an aid in the diagnosis of influenza from Nasopharyngeal swab specimens and should not be used as a sole basis for treatment. Nasal washings and aspirates are unacceptable for  Xpert Xpress SARS-CoV-2/FLU/RSV testing.  Fact Sheet for Patients: BloggerCourse.com  Fact Sheet for Healthcare Providers: SeriousBroker.it  This test is not yet approved or cleared by the [place] FDA and has been authorized for detection and/or diagnosis of SARS-CoV-2 by FDA under an Emergency Use Authorization (EUA). This EUA will remain in effect (meaning this test can be used) for the duration of the COVID-19 declaration under Section 564(b)(1) of the Act, 21 U.S.C. section 360bbb-3(b)(1), unless the authorization is terminated or revoked.  Performed at [facility], [address] [place], [place] [address]   Culture, blood (routine x 2)     Status: None (Preliminary result)   Collection Time: 10/23/20  8:27 AM   Specimen: BLOOD  Result Value Ref Range Status   Specimen Description   Final    BLOOD BLOOD LEFT FOREARM Performed at [facility], [address] [place], [place] [address]    Special Requests   Final    BOTTLES DRAWN AEROBIC AND ANAEROBIC Blood Culture adequate volume Performed at [facility], [address], [place], [place] [address]    Culture   Final    NO GROWTH < 24 HOURS Performed at [facility], [address], [place], [place] [address]    Report Status PENDING  Incomplete    Radiology Studies: No results found.   Marithza Malachi T. Jaylanie Boschee [facility]  If 7PM-7AM, please contact night-coverage [email or web address] 10/25/2020, 1:26 PM

## 2020-10-25 NOTE — Progress Notes (Signed)
  Speech Language Pathology Treatment: Dysphagia  Patient Details Name: Carolyn Norton MRN: [id number] DOB: 27-Mar-1966 Today's Date: 10/25/2020 Time: 5573-2202 SLP Time Calculation (min) (ACUTE ONLY): 12 min  Assessment / Plan / Recommendation Clinical Impression  F/u after yesterday's initial swallow assessment. Remains confused but pleasant.  Assisted with positioning because pt unable to pull herself into seated position in bed. She fed herself crackers and water from a straw with mildly prolonged but functional mastication due to missing teeth; brisk swallow response; no s/s of aspiration nor a true dysphagia.  Primary barriers to PO intake will be inattention, confusion, and lack of interest.  Recommend continuing regular diet to allow for most options; continue thin liquids and meds with water. She will need assist with tray set-up, but no further SLP f/u is needed.  Our service will sign off.    HPI HPI: Carolyn Norton is a 55 y.o. female with medical history significant for type 2 diabetes, bipolar type I, hypertension, recent Covid infection, and polysubstance abuse who presents due to concerns of seizure-like activity.      SLP Plan  All goals met       Recommendations  Diet recommendations: Regular;Thin liquid Liquids provided via: Cup;Straw Medication Administration: Whole meds with liquid Supervision: Patient able to self feed Compensations: Minimize environmental distractions                Oral Care Recommendations: Oral care BID Follow up Recommendations: None SLP Visit Diagnosis: Dysphagia, unspecified (R13.10) Plan: All goals met       GO                L. Tivis Ringer, [place] CCC/SLP Acute Rehabilitation Services Office number [phone] Pager [phone]  Juan Quam Laurice 10/25/2020, 3:11 PM

## 2020-10-25 NOTE — Evaluation (Signed)
Occupational Therapy Evaluation Patient Details Name: Carolyn Norton MRN: [id number] DOB: [birth year] Today's Date: 10/25/2020    History of Present Illness 55 year old F with PMH of DM-2, BPD type I, HTN, recent COVID-19 infection and polysubstance use presenting with "seizure-like activity" and admitted for acute metabolic encephalopathy and AKI likely due to cocaine use.  UDS was positive for cocaine.  Encephalopathy thought to be due to cocaine abuse, and improving.   Clinical Impression   Carolyn Norton presents with generalized weakness, decreased activity tolerance, impaired balance and confusion resulting in needing assistance with ambulation and ADLs. Patient found supine in bed stating "I need to get out of here. I'm leaving." Patient's gown wet from incontinence. Patient min assist to transfer to side of bed and min assist to stand and min-mod assist to maintain balance to ambulate to bathroom with RW. Patient impulsive and restless and very unsafe. Performed bathing and toileting task seated on toilet. Patient's balance poor in sitting and standing and needing upper extremity support. Patient able to follow patient's commands but patient has poor insights into her deficits and continues to state "I'm going home." Patient sat in recliner briefly to eat a couple of bites of breakfast - then stated "I'm done." Returned to bed for safety - patient crawling into bed. Patient will benefit from skilled OT services while in hospital to improve deficits and learn compensatory strategies as needed in order to return to PLOF. If patient's cognition improves suspect she will be able to go home with [facility]. If cognition does not improve will need short term rehab or 24/7 assistance at home.     Follow Up Recommendations  Home health OT;SNF    Equipment Recommendations   (TBD)    Recommendations for Other Services       Precautions / Restrictions Precautions Precautions: Fall Precaution  Comments: seizure Restrictions Weight Bearing Restrictions: No      Mobility Bed Mobility Overal bed mobility: Needs Assistance Bed Mobility: Supine to Sit;Sit to Supine     Supine to sit: Min assist Sit to supine: Min guard   General bed mobility comments: Min assist to initiate transfer into sitting. On return to bed - patient unsafely crawled in to bed and rolled onto her side.    Transfers Overall transfer level: Needs assistance Equipment used: 1 person hand held assist;Rolling walker (2 wheeled) Transfers: Sit to/from [employer] Sit to Stand: Min assist Stand pivot transfers: Min assist;Mod assist       General transfer comment: Attempted to use walker but patient using it unaffectively. Patient min-mod assist to maintain balance.    Balance Overall balance assessment: Needs assistance Sitting-balance support: No upper extremity supported;Feet supported Sitting balance-Leahy Scale: Fair     Standing balance support: Single extremity supported;Bilateral upper extremity supported Standing balance-Leahy Scale: Poor Standing balance comment: grabbing onto anything to have UE support                           ADL either performed or assessed with clinical judgement   ADL Overall ADL's : Needs assistance/impaired Eating/Feeding: Set up   Grooming: Set up;Sitting   Upper Body Bathing: Set up;Sitting   Lower Body Bathing: Moderate assistance;Sit to/from stand;Set up   Upper Body Dressing : Set up;Sitting   Lower Body Dressing: Moderate assistance;Sit to/from stand   Toilet Transfer: Minimal assistance;Grab bars;Regular Toilet;RW   Toileting- Architect and Hygiene: Min guard;Sit to/from stand;Cueing for sequencing  Vision   Vision Assessment?: No apparent visual deficits     Perception     Praxis      Pertinent Vitals/Pain Pain Assessment: No/denies pain     Hand Dominance      Extremity/Trunk Assessment Upper Extremity Assessment Upper Extremity Assessment: Generalized weakness   Lower Extremity Assessment Lower Extremity Assessment: Generalized weakness   Cervical / Trunk Assessment Cervical / Trunk Assessment: Normal   Communication Communication Communication: No difficulties   Cognition Arousal/Alertness: Awake/alert Behavior During Therapy: Impulsive;Restless Overall Cognitive Status: Impaired/Different from baseline Area of Impairment: Orientation;Following commands;Attention;Problem solving                 Orientation Level: Disoriented to;Situation;Time Current Attention Level: Sustained   Following Commands: Follows one step commands with increased time     Problem Solving: Slow processing;Requires verbal cues     General Comments       Exercises     Shoulder Instructions      Home Living Family/patient expects to be discharged to:: Private residence Living Arrangements: Parent Available Help at Discharge: Family Type of Home: Apartment Home Access: Level entry     Home Layout: One level                   Additional Comments: unknown, pt poor historian      Prior Functioning/Environment Level of Independence: Independent        Comments: Reports she works at [employer].        OT Problem List: Decreased strength;Decreased activity tolerance;Impaired balance (sitting and/or standing);Decreased cognition;Decreased safety awareness;Decreased knowledge of use of DME or AE      OT Treatment/Interventions: Self-care/ADL training;Therapeutic exercise;DME and/or AE instruction;Patient/family education;Balance training;Therapeutic activities    OT Goals(Current goals can be found in the care plan section) Acute Rehab OT Goals OT Goal Formulation: Patient unable to participate in goal setting Time For Goal Achievement: 11/08/20 Potential to Achieve Goals: Fair  OT Frequency: Min 2X/week   Barriers to D/C:             Co-evaluation              AM-PAC OT "6 Clicks" Daily Activity     Outcome Measure Help from another person eating meals?: A Little Help from another person taking care of personal grooming?: A Little Help from another person toileting, which includes using toliet, bedpan, or urinal?: A Lot Help from another person bathing (including washing, rinsing, drying)?: A Lot Help from another person to put on and taking off regular upper body clothing?: A Little Help from another person to put on and taking off regular lower body clothing?: A Lot 6 Click Score: 15   End of Session Equipment Utilized During Treatment: Rolling walker Nurse Communication: Mobility status  Activity Tolerance: Patient tolerated treatment well Patient left: in bed;with call bell/phone within reach;with bed alarm set  OT Visit Diagnosis: Unsteadiness on feet (R26.81);Muscle weakness (generalized) (M62.81);Other symptoms and signs involving cognitive function                Time: 3810-1751 OT Time Calculation (min): 23 min Charges:  OT General Charges $OT Visit: 1 Visit OT Evaluation $OT Eval Low Complexity: 1 Low OT Treatments $Self Care/Home Management : 8-22 mins  Yaffa Seckman, OTR/L Acute Care Rehab Services  Office [phone] Pager: [phone]   Kelli Churn 10/25/2020, 1:21 PM

## 2020-10-26 ENCOUNTER — Other Ambulatory Visit: Payer: Self-pay

## 2020-10-26 DIAGNOSIS — F17200 Nicotine dependence, unspecified, uncomplicated: Secondary | ICD-10-CM

## 2020-10-26 LAB — TSH: TSH: 0.549 u[IU]/mL (ref 0.350–4.500)

## 2020-10-26 LAB — RENAL FUNCTION PANEL
Albumin: 2.8 g/dL — ABNORMAL LOW (ref 3.5–5.0)
Anion gap: 10 (ref 5–15)
BUN: 15 mg/dL (ref 6–20)
CO2: 28 mmol/L (ref 22–32)
Calcium: 8.7 mg/dL — ABNORMAL LOW (ref 8.9–10.3)
Chloride: 101 mmol/L (ref 98–111)
Creatinine, Ser: 1.18 mg/dL — ABNORMAL HIGH (ref 0.44–1.00)
GFR, Estimated: 55 mL/min — ABNORMAL LOW (ref >60)
Glucose, Bld: 191 mg/dL — ABNORMAL HIGH (ref 70–99)
Phosphorus: 3.6 mg/dL (ref 2.5–4.6)
Potassium: 4 mmol/L (ref 3.5–5.1)
Sodium: 139 mmol/L (ref 135–145)

## 2020-10-26 LAB — GLUCOSE, CAPILLARY
Glucose-Capillary: 129 mg/dL — ABNORMAL HIGH (ref 70–99)
Glucose-Capillary: 251 mg/dL — ABNORMAL HIGH (ref 70–99)
Glucose-Capillary: 184 mg/dL — ABNORMAL HIGH (ref 70–99)
Glucose-Capillary: 263 mg/dL — ABNORMAL HIGH (ref 70–99)
Glucose-Capillary: 208 mg/dL — ABNORMAL HIGH (ref 70–99)

## 2020-10-26 LAB — MAGNESIUM: Magnesium: 1.8 mg/dL (ref 1.7–2.4)

## 2020-10-26 LAB — VITAMIN B12: Vitamin B-12: 714 pg/mL (ref 180–914)

## 2020-10-26 LAB — VITAMIN D 25 HYDROXY (VIT D DEFICIENCY, FRACTURES): Vit D, 25-Hydroxy: 22.18 ng/mL — ABNORMAL LOW (ref 30–100)

## 2020-10-26 MED ORDER — THIAMINE HCL 100 MG/ML IJ SOLN
500.0000 mg | Freq: Three times a day (TID) | INTRAVENOUS | Status: DC
  Filled 2020-10-26: qty 5

## 2020-10-26 MED ORDER — NICOTINE 21 MG/24HR TD PT24
21.0000 mg | MEDICATED_PATCH | Freq: Every day | TRANSDERMAL | Status: DC
  Administered 2020-10-26 – 2020-10-27 (×2): 21 mg via TRANSDERMAL
  Filled 2020-10-26 (×2): qty 1

## 2020-10-26 MED ORDER — THIAMINE HCL 100 MG/ML IJ SOLN: 100.0000 mg | Freq: Every day | INTRAMUSCULAR | Status: DC

## 2020-10-26 MED ORDER — CLONAZEPAM 0.5 MG PO TABS
0.5000 mg | ORAL_TABLET | Freq: Two times a day (BID) | ORAL | Status: DC
  Administered 2020-10-26 – 2020-10-27 (×3): 0.5 mg via ORAL
  Filled 2020-10-26 (×3): qty 1

## 2020-10-26 MED ORDER — THIAMINE HCL 100 MG PO TABS
250.0000 mg | ORAL_TABLET | Freq: Every day | ORAL | Status: DC
Start: 2020-10-26 — End: 2020-10-27
  Administered 2020-10-26 – 2020-10-27 (×2): 250 mg via ORAL
  Filled 2020-10-26 (×2): qty 3

## 2020-10-26 MED ORDER — THIAMINE HCL 100 MG/ML IJ SOLN: 250.0000 mg | Freq: Every day | INTRAVENOUS | Status: DC

## 2020-10-26 NOTE — Progress Notes (Signed)
PROGRESS NOTE  Lucilla LameMichelle Lee Haros ZOX:[id number] DOB: 10-21-1965   PCP: Claiborne RiggFleming, Zelda W, NP  Patient is from: Home  DOA: 10/22/2020 LOS: 4  Chief complaints: Seizure-like activity  Brief Narrative / Interim history: 55 year old F with PMH of DM-2, BPD type I, HTN, recent COVID-19 infection and polysubstance use presenting with "seizure-like activity" and admitted for acute metabolic encephalopathy and AKI likely due to cocaine use.  UDS was positive for cocaine.  Encephalopathy thought to be due to cocaine abuse, and improving.  Therapy recommended HH with 24-hour 7 supervision or SNF.  Patient lives with her mother and father.  Family concerned about providing 24/7 supervision assistance but she is difficult for SNF placement due to cocaine use.  Subjective: Seen and examined earlier this morning.  Reportedly, she was restless, got out of the bed and urinated on the floor. She also had an episode of hypoglycemia to 53 yesterday evening.  She is a sleepy this morning but awakes to voice although she falls back to sleep.  She is oriented to self, place, month, year, president and to some extent situation but not to date.  She is asking when she could go home.  No complaints.  Objective: Vitals:   10/25/20 2111 10/26/20 0718 10/26/20 0729 10/26/20 1351  BP: 130/80 134/72  117/68  Pulse: (!) 59 (!) 52  75  Resp: 18 18  18   Temp: 97.9 F (36.6 C) 97.6 F (36.4 C)  (!) 97.4 F (36.3 C)  TempSrc: Oral Oral  Oral  SpO2: 94% 98% 99% 98%  Weight:      Height:        Intake/Output Summary (Last 24 hours) at 10/26/2020 1514 Last data filed at 10/26/2020 1430 Gross per 24 hour  Intake 360 ml  Output 1050 ml  Net -690 ml   Filed Weights   10/22/20 2100  Weight: 85.5 kg    Examination:  GENERAL: No apparent distress.  Nontoxic. HEENT: MMM.  Vision and hearing grossly intact.  NECK: Supple.  No apparent JVD.  RESP: On RA.  No IWOB.  Fair aeration bilaterally. CVS:  RRR. Heart  sounds normal.  ABD/GI/GU: BS+. Abd soft, NTND.  MSK/EXT:  Moves extremities. No apparent deformity. No edema.  SKIN: no apparent skin lesion or wound NEURO: Sleepy but wakes to voice.  Not quite alert.  Oriented x5 except date.  No apparent focal neuro deficit. PSYCH: Calm and sleepy.   Procedures:  None  Microbiology summarized: COVID-19 and influenza PCR nonreactive.  Assessment & Plan: Acute toxic encephalopathy likely due to cocaine abuse.  Some concern about seizure-like activity but no witnessed seizure since arrival.  No focal neuro symptoms. Encephalopathy improving but restless at times.  Oriented x5 except to date.  -Start p.o. thiamine -Continue fall and seizure precautions -Cleared by SLP-start regular diet. -Appreciate PT/OT  Cocaine abuse: Advised to stop using cocaine but had no recollection of our discussion yesterday. -TOC consulted for resources and counseling  Hypotension: Likely dehydration.  Resolved. -Continue holding home antihypertensive meds  AKI/azotemia on CKD-3A: Likely a mix of prerenal from dehydration and ATN from cocaine use.  Resolved. Recent Labs    09/06/20 1900 09/06/20 1948 09/07/20 0029 09/07/20 [id number] 09/08/20 0236 10/22/20 1522 10/23/20 0313 10/24/20 0303 10/25/20 0527 10/26/20 0602  BUN 49* 47* 43* 41* 32* 46* 40* 32* 21* 15  CREATININE 2.13* 2.00* 1.65* 1.44* 1.14* 3.42* 2.59* 1.80* 1.37* 1.18*  -Continue holding home lisinopril  Anion gap metabolic acidosis: Likely due to  azotemia and AKI.  Resolved.  Hyperkalemia: Resolved.  Could be due to acidosis.  Uncontrolled IDDM-2: A1c 11.6%.  Likely noncompliant with meds. Recent Labs  Lab 10/25/20 1815 10/25/20 1911 10/25/20 2201 10/26/20 0724 10/26/20 1146  GLUCAP 53* 129* 251* 184* 263*  -Continue SSI-sensitive -Continue NovoLog AC 4 units -Continue Lantus 15 units twice daily  Leukocytosis/bandemia: Likely demargination.  Resolved.  Debility/physical  deconditioning -Therapy recommended HH and DME with 24/7 supervision and assistance or SNF but family won't be able to provide this.  Difficult for SNF placement due to cocaine use.  Tobacco use disorder -Started nicotine patch.  Bipolar disorder type I -Continue home Depakote and Lexapro-but not sure the last time she took those medications Body mass index is 30.42 kg/m.         DVT prophylaxis:  enoxaparin (LOVENOX) injection 40 mg Start: 10/24/20 2200  Code Status: Full code Family Communication: Updated patient's mother over the phone with patient's permission Level of care: Med-Surg Status is: Inpatient  Remains inpatient appropriate because:Unsafe d/c plan and Inpatient level of care appropriate due to severity of illness   Dispo: The patient is from: Home              Anticipated d/c is to: Undetermined.               Anticipated d/c date is: 1 day              Patient currently is not medically stable to d/c.   Difficult to place patient Yes       Consultants:  None   Sch Meds:  Scheduled Meds: . atorvastatin  20 mg Oral Daily  . Chlorhexidine Gluconate Cloth  6 each Topical Daily  . clonazePAM  0.5 mg Oral BID  . divalproex  500 mg Oral BID  . enoxaparin (LOVENOX) injection  40 mg Subcutaneous Q24H  . escitalopram  20 mg Oral Daily  . gabapentin  200 mg Oral BID  . insulin aspart  0-5 Units Subcutaneous QHS  . insulin aspart  0-9 Units Subcutaneous TID WC  . insulin aspart  4 Units Subcutaneous TID WC  . insulin glargine  15 Units Subcutaneous BID  . mouth rinse  15 mL Mouth Rinse BID  . mometasone-formoterol  2 puff Inhalation BID  . montelukast  10 mg Oral QHS  . nicotine  21 mg Transdermal Daily  . oxybutynin  5 mg Oral BID  . thiamine  250 mg Oral Daily   Continuous Infusions:  PRN Meds:.  Antimicrobials: Anti-infectives (From admission, onward)   Start     Dose/Rate Route Frequency Ordered Stop   10/22/20 1721  ampicillin (OMNIPEN) 2 g  in sodium chloride 0.9 % 100 mL IVPB  Status:  Discontinued        2 g 300 mL/hr over 20 Minutes Intravenous  Once 10/22/20 1705 10/22/20 2106   10/22/20 1715  cefTRIAXone (ROCEPHIN) 2 g in sodium chloride 0.9 % 100 mL IVPB        2 g 200 mL/hr over 30 Minutes Intravenous  Once 10/22/20 1705 10/22/20 1909   10/22/20 1715  vancomycin (VANCOCIN) IVPB 1000 mg/200 mL premix  Status:  Discontinued        1,000 mg 200 mL/hr over 60 Minutes Intravenous  Once 10/22/20 1705 10/22/20 2106       I have personally reviewed the following labs and images: CBC: Recent Labs  Lab 10/22/20 1522 10/23/20 0313 10/24/20 0303  WBC 18.7* 10.9*  10.5  NEUTROABS 12.1*  --   --   HGB 13.0 14.0 12.9  HCT 38.2 43.4 40.7  MCV 83.2 88.4 88.7  PLT 229 175 156   BMP &GFR Recent Labs  Lab 10/22/20 1522 10/23/20 0313 10/24/20 0303 10/25/20 0527 10/26/20 0602  NA 139 139 141 137 139  K 4.0 5.4* 4.7 4.8 4.0  CL 96* 99 101 99 101  CO2 GLUCOSE 123* 121* 139* 223* 191*  BUN 46* 40* 32* 21* 15  CREATININE 3.42* 2.59* 1.80* 1.37* 1.18*  CALCIUM 10.3 9.3 8.9 8.8* 8.7*  MG  --   --  1.7 1.8 1.8  PHOS  --   --  2.5 2.3* 3.6   Estimated Creatinine Clearance: 60.8 mL/min (A) (by C-G formula based on SCr of 1.18 mg/dL (H)). Liver & Pancreas: Recent Labs  Lab 10/22/20 1522 10/23/20 0313 10/24/20 0303 10/25/20 0527 10/26/20 0602  AST 16 25  --   --   --   ALT 12 13  --   --   --   ALKPHOS 69 57  --   --   --   BILITOT 0.6 0.5  --   --   --   PROT 8.0 7.7  --   --   --   ALBUMIN 4.2 3.6 3.0* 3.2* 2.8*   No results for input(s): LIPASE, AMYLASE in the last 168 hours. No results for input(s): AMMONIA in the last 168 hours. Diabetic: No results for input(s): HGBA1C in the last 72 hours. Recent Labs  Lab 10/25/20 1815 10/25/20 1911 10/25/20 2201 10/26/20 0724 10/26/20 1146  GLUCAP 53* 129* 251* 184* 263*   Cardiac Enzymes: Recent Labs  Lab 10/22/20 1630 10/24/20 0303  CKTOTAL  303* 179   No results for input(s): PROBNP in the last 8760 hours. Coagulation Profile: No results for input(s): INR, PROTIME in the last 168 hours. Thyroid Function Tests: Recent Labs    10/26/20 0602  TSH 0.549   Lipid Profile: No results for input(s): CHOL, HDL, LDLCALC, TRIG, CHOLHDL, LDLDIRECT in the last 72 hours. Anemia Panel: Recent Labs    10/26/20 0602  VITAMINB12 714   Urine analysis:    Component Value Date/Time   COLORURINE YELLOW 10/22/2020 1817   APPEARANCEUR HAZY (A) 10/22/2020 1817   LABSPEC 1.025 10/22/2020 1817   PHURINE 5.0 10/22/2020 1817   GLUCOSEU 150 (A) 10/22/2020 1817   HGBUR MODERATE (A) 10/22/2020 1817   BILIRUBINUR NEGATIVE 10/22/2020 1817   KETONESUR 5 (A) 10/22/2020 1817   PROTEINUR 30 (A) 10/22/2020 1817   UROBILINOGEN 0.2 07/20/2014 2022   NITRITE NEGATIVE 10/22/2020 1817   LEUKOCYTESUR NEGATIVE 10/22/2020 1817   Sepsis Labs: Invalid input(s): PROCALCITONIN, LACTICIDVEN  Microbiology: Recent Results (from the past 240 hour(s))  Urine culture     Status: Abnormal   Collection Time: 10/22/20  6:17 PM   Specimen: Urine, Random  Result Value Ref Range Status   Specimen Description   Final    URINE, RANDOM Performed at [facility], [address], [place], [place] [address]    Special Requests   Final    NONE Performed at [facility], [address] [place], [place] [address]    Culture (A)  Final    <10,000 COLONIES/mL INSIGNIFICANT GROWTH Performed at [facility], [address] [place], [place] [address]    Report Status 10/24/2020 FINAL  Final  Culture, blood (routine x 2)  Status: None (Preliminary result)   Collection Time: 10/22/20  6:17 PM   Specimen: BLOOD  Result Value Ref Range Status   Specimen Description   Final    BLOOD RIGHT ANTECUBITAL Performed at [facility], [address] [place], [place] [address]    Special Requests   Final     BOTTLES DRAWN AEROBIC AND ANAEROBIC Blood Culture adequate volume Performed at [facility], [address], [place], [place] [address]    Culture   Final    NO GROWTH 4 DAYS Performed at [facility], [address], [place], [place] [address]    Report Status PENDING  Incomplete  Resp Panel by RT-PCR (Flu A&B, Covid) Nasopharyngeal Swab     Status: None   Collection Time: 10/22/20  8:00 PM   Specimen: Nasopharyngeal Swab; Nasopharyngeal(NP) swabs in vial transport medium  Result Value Ref Range Status   SARS Coronavirus 2 by RT PCR NEGATIVE NEGATIVE Final    Comment: (NOTE) SARS-CoV-2 target nucleic acids are NOT DETECTED.  The SARS-CoV-2 RNA is generally detectable in upper respiratory specimens during the acute phase of infection. The lowest concentration of SARS-CoV-2 viral copies this assay can detect is 138 copies/mL. A negative result does not preclude SARS-Cov-2 infection and should not be used as the sole basis for treatment or other patient management decisions. A negative result may occur with  improper specimen collection/handling, submission of specimen other than nasopharyngeal swab, presence of viral mutation(s) within the areas targeted by this assay, and inadequate number of viral copies(<138 copies/mL). A negative result must be combined with clinical observations, patient history, and epidemiological information. The expected result is Negative.  Fact Sheet for Patients:  BloggerCourse.com  Fact Sheet for Healthcare Providers:  SeriousBroker.it  This test is no t yet approved or cleared by the [place] FDA and  has been authorized for detection and/or diagnosis of SARS-CoV-2 by FDA under an Emergency Use Authorization (EUA). This EUA will remain  in effect (meaning this test can be used) for the duration of the COVID-19 declaration under Section 564(b)(1) of the Act,  21 U.S.C.section 360bbb-3(b)(1), unless the authorization is terminated  or revoked sooner.       Influenza A by PCR NEGATIVE NEGATIVE Final   Influenza B by PCR NEGATIVE NEGATIVE Final    Comment: (NOTE) The Xpert Xpress SARS-CoV-2/FLU/RSV plus assay is intended as an aid in the diagnosis of influenza from Nasopharyngeal swab specimens and should not be used as a sole basis for treatment. Nasal washings and aspirates are unacceptable for Xpert Xpress SARS-CoV-2/FLU/RSV testing.  Fact Sheet for Patients: BloggerCourse.com  Fact Sheet for Healthcare Providers: SeriousBroker.it  This test is not yet approved or cleared by the [place] FDA and has been authorized for detection and/or diagnosis of SARS-CoV-2 by FDA under an Emergency Use Authorization (EUA). This EUA will remain in effect (meaning this test can be used) for the duration of the COVID-19 declaration under Section 564(b)(1) of the Act, 21 U.S.C. section 360bbb-3(b)(1), unless the authorization is terminated or revoked.  Performed at [facility] [address], [place], [place] [address]   Culture, blood (routine x 2)     Status: None (Preliminary result)   Collection Time: 10/23/20  8:27 AM   Specimen: BLOOD  Result Value Ref Range Status   Specimen Description   Final    BLOOD BLOOD LEFT FOREARM Performed at [facility], [address] [place],  [place] [address]    Special Requests   Final    BOTTLES DRAWN AEROBIC AND ANAEROBIC Blood Culture adequate volume Performed at [facility], [address] [place], [place] [address]    Culture   Final    NO GROWTH 3 DAYS Performed at [facility], [address], [place], [place] [address]    Report Status PENDING  Incomplete    Radiology Studies: No results found.   Evone Arseneau T. Anastacia Reinecke [facility]  If 7PM-7AM, please contact  night-coverage [email or web address] 10/26/2020, 3:14 PM

## 2020-10-27 DIAGNOSIS — R5381 Other malaise: Secondary | ICD-10-CM

## 2020-10-27 DIAGNOSIS — F1490 Cocaine use, unspecified, uncomplicated: Secondary | ICD-10-CM

## 2020-10-27 LAB — RENAL FUNCTION PANEL
Albumin: 2.9 g/dL — ABNORMAL LOW (ref 3.5–5.0)
Anion gap: 8 (ref 5–15)
BUN: 15 mg/dL (ref 6–20)
CO2: 29 mmol/L (ref 22–32)
Calcium: 8.6 mg/dL — ABNORMAL LOW (ref 8.9–10.3)
Chloride: 100 mmol/L (ref 98–111)
Creatinine, Ser: 1.23 mg/dL — ABNORMAL HIGH (ref 0.44–1.00)
GFR, Estimated: 53 mL/min — ABNORMAL LOW (ref >60)
Glucose, Bld: 174 mg/dL — ABNORMAL HIGH (ref 70–99)
Phosphorus: 3.5 mg/dL (ref 2.5–4.6)
Potassium: 3.7 mmol/L (ref 3.5–5.1)
Sodium: 137 mmol/L (ref 135–145)

## 2020-10-27 LAB — CBC
HCT: 38.7 % (ref 36.0–46.0)
Hemoglobin: 12.5 g/dL (ref 12.0–15.0)
MCH: 27.8 pg (ref 26.0–34.0)
MCHC: 32.3 g/dL (ref 30.0–36.0)
MCV: 86 fL (ref 80.0–100.0)
Platelets: 162 10*3/uL (ref 150–400)
RBC: 4.5 MIL/uL (ref 3.87–5.11)
RDW: 14.1 % (ref 11.5–15.5)
WBC: 5 10*3/uL (ref 4.0–10.5)
nRBC: 0 % (ref 0.0–0.2)

## 2020-10-27 LAB — GLUCOSE, CAPILLARY
Glucose-Capillary: 199 mg/dL — ABNORMAL HIGH (ref 70–99)
Glucose-Capillary: 163 mg/dL — ABNORMAL HIGH (ref 70–99)
Glucose-Capillary: 269 mg/dL — ABNORMAL HIGH (ref 70–99)

## 2020-10-27 LAB — CULTURE, BLOOD (ROUTINE X 2)
Culture: NO GROWTH
Special Requests: ADEQUATE

## 2020-10-27 LAB — MAGNESIUM: Magnesium: 1.9 mg/dL (ref 1.7–2.4)

## 2020-10-27 MED ORDER — THIAMINE HCL 100 MG PO TABS: 100.0000 mg | ORAL_TABLET | Freq: Every day | ORAL | 1 refills | Status: DC

## 2020-10-27 MED ORDER — DIVALPROEX SODIUM 500 MG PO DR TAB: 500.0000 mg | DELAYED_RELEASE_TABLET | Freq: Two times a day (BID) | ORAL | 1 refills | Status: DC

## 2020-10-27 MED ORDER — ESCITALOPRAM OXALATE 20 MG PO TABS: 20.0000 mg | ORAL_TABLET | Freq: Every day | ORAL | 1 refills | Status: DC

## 2020-10-27 MED ORDER — LISINOPRIL 20 MG PO TABS: 20.0000 mg | ORAL_TABLET | Freq: Every day | ORAL | 0 refills | Status: DC

## 2020-10-27 NOTE — Discharge Instructions (Signed)
Acute Kidney Injury, Adult  Acute kidney injury is a sudden worsening of kidney function. The kidneys are organs that have several jobs. They filter the blood to remove waste products and extra fluid. They also maintain a healthy balance of minerals and hormones in the body, which helps control blood pressure and keep bones strong. With this condition, your kidneys do not do their jobs as well as they should. This condition ranges from mild to severe. Over time, it may develop into long-lasting (chronic) kidney disease. Early detection and treatment may prevent acute kidney injury from developing into a chronic condition. What are the causes? Common causes of this condition include:  A problem with blood flow to the kidneys. This may be caused by: ? Low blood pressure (hypotension) or shock. ? Blood loss. ? Heart and blood vessel (cardiovascular) disease. ? Severe burns. ? Liver disease.  Direct damage to the kidneys. This may be caused by: ? Certain medicines. ? A kidney infection. ? Poisoning. ? Being around or in contact with toxic substances. ? A surgical wound. ? A hard, direct hit to the kidney area.  A sudden blockage of urine flow. This may be caused by: ? Cancer. ? Kidney stones. ? An enlarged prostate in males. What increases the risk? You are more likely to develop this condition if you:  Are older than age 30.  Are female.  Are hospitalized, especially if you are in critical condition.  Have certain conditions, such as: ? Chronic kidney disease. ? Diabetes. ? Coronary artery disease and heart failure. ? Pulmonary disease. ? Chronic liver disease. What are the signs or symptoms? Symptoms of this condition may not be obvious until the condition becomes severe. Symptoms of this condition can include:  Tiredness (lethargy) or difficulty staying awake.  Nausea or vomiting.  Swelling (edema) of the face, legs, ankles, or feet.  Problems with urination, such  as: ? Pain in the abdomen, or pain along the side of your stomach (flank). ? Producing little or no urine. ? Passing urine with a weak flow.  Muscle twitches and cramps, especially in the legs.  Confusion or trouble concentrating.  Loss of appetite.  Fever. How is this diagnosed? Your health care provider can diagnose this condition based on your symptoms, medical history, and a physical exam.  You may also have other tests, such as:  Blood tests.  Urine tests.  Imaging tests.  A test in which a sample of tissue is removed from the kidneys to be examined under a microscope (kidney biopsy). How is this treated? Treatment for this condition depends on the cause and how severe the condition is. In mild cases, treatment may not be needed. The kidneys may heal on their own. In more severe cases, treatment will involve:  Treating the cause of the kidney injury. This may involve changing any medicines you are taking or adjusting your dosage.  Fluids. You may need specialized IV fluids to balance your body's needs.  Having a catheter placed to drain urine and prevent blockages.  Preventing problems from occurring. This may mean avoiding certain medicines or procedures that can cause further injury to the kidneys. In some cases, treatment may also require:  A procedure to remove toxic wastes from the body (dialysis or continuous renal replacement therapy, CRRT).  Surgery. This may be done to repair a torn kidney or to remove the blockage from the urinary system. Follow these instructions at home: Medicines  Take over-the-counter and prescription medicines only as  told by your health care provider.  Do not take any new medicines without your health care provider's approval. Many medicines can worsen your kidney damage.  Do not take any vitamin and mineral supplements without your health care provider's approval. Many nutritional supplements can worsen your kidney  damage. Lifestyle  If your health care provider prescribed changes to your diet, follow them. You may need to decrease the amount of protein you eat.  Achieve and maintain a healthy weight. If you need help with this, ask your health care provider.  Start or continue an exercise plan. Try to exercise at least 30 minutes a day, 5 days a week.  Do not use any products that contain nicotine or tobacco, such as cigarettes, e-cigarettes, and chewing tobacco. If you need help quitting, ask your health care provider.   General instructions  Keep track of your blood pressure. Report changes in your blood pressure as told by your health care provider.  Stay up to date with your vaccines. Ask your health care provider which vaccines you need.  Keep all follow-up visits as told by your health care provider. This is important.   Where to find more information  American Association of Kidney Patients: BombTimer.gl  National Kidney Foundation: www.kidney.[place]: https://mathis.com/  [facility]: ? www.lifeoptions.org ? www.kidneyschool.org Contact a health care provider if:  Your symptoms get worse.  You develop new symptoms. Get help right away if:  You develop symptoms of worsening kidney disease, which include: ? Headaches. ? Abnormally dark or light skin. ? Easy bruising. ? Frequent hiccups. ? Chest pain. ? Shortness of breath. ? End of menstruation in women. ? Seizures. ? Confusion or altered mental status. ? Abdominal or back pain. ? Itchiness.  You have a fever.  Your body is producing less urine.  You have pain or bleeding when you urinate. Summary  Acute kidney injury is a sudden worsening of kidney function.  Acute kidney injury can be caused by problems with blood flow to the kidneys, direct damage to the kidneys, and sudden blockage of urine flow.  Symptoms of this condition may not be obvious until it becomes severe.  Symptoms may include edema, lethargy, confusion, nausea or vomiting, and problems passing urine.  This condition can be diagnosed with blood tests, urine tests, and imaging tests. Sometimes a kidney biopsy is done to diagnose this condition.  Treatment for this condition often involves treating the underlying cause. It is treated with fluids, medicines, diet changes, dialysis, or surgery. This information is not intended to replace advice given to you by your health care provider. Make sure you discuss any questions you have with your health care provider. Document Revised: 07/04/2019 Document Reviewed: 07/04/2019 Elsevier Patient Education  2021 [place] [employer] Taking your medicines correctly is an important part of managing or preventing medical problems. Make sure you know what disease or condition your medicine is treating, and how and when to take it. If you do not take your medicine correctly, it may not work well and may cause unpleasant side effects, including serious health problems. What should I do when I am taking medicines?  Read all the labels and inserts that come with your medicines. Review the information often.  Talk with your pharmacist if you get a refill and notice a change in the size, color, or shape of your medicines.  Know the potential side effects for each medicine that you take.  Try to get all your medicines from the same pharmacy. The pharmacist will have all your information and will understand how your medicines will affect each other (interact).  Tell your health care provider about all your medicines, including over-the-counter medicines, vitamins, and herbal or dietary supplements. He or she will make sure that nothing will interact with any of your prescribed medicines.   How can I take my medicines safely?  Take medicines only as told by your health care provider. ? Do not take more of your medicine than instructed. ? Do  not take anyone else's medicines. ? Do not share your medicines with others. ? Do not stop taking your medicines unless your health care provider tells you to do so. ? You may need to avoid alcohol or certain foods or liquids when taking certain medicines. Follow your health care provider's instructions.  Do not split, mash, or chew your medicines unless your health care provider tells you to do so. Tell your health care provider if you have trouble swallowing your medicines.  For liquid medicine, use the dosing container that was provided. How should I organize my medicines? Know your medicines  Know what each of your medicines looks like. This includes size, color, and shape. Tell your health care provider if you are having trouble recognizing all the medicines that you are taking.  If you cannot tell your medicines apart because they look similar, keep them in original bottles.  If you cannot read the labels on the bottles, tell your pharmacist to put your medicines in containers with large print.  Review your medicines and your schedule with family members, a friend, or a caregiver. Use a pill organizer  Use a tool to organize your medicine schedule. Tools include a weekly pillbox, a written chart, a notebook, or a calendar.  Your tool should help you remember the following things about each medicine: ? The name of the medicine. ? The amount (dose) to take. ? The schedule. This is the day and time the medicine should be taken. ? The appearance. This includes color, shape, size, and stamp. ? How to take your medicines. This includes instructions to take them with food, without food, with fluids, or with other medicines.  Create reminders for taking your medicines. Use sticky notes, or alarms on your watch, mobile device, or phone calendar.  You may choose to use a more advanced management system. These systems have storage, alarms, and visual and audio prompts.  Some medicines can  be taken on an "as-needed" basis. These include medicines for nausea or pain. If you take an as-needed medicine, write down the name and dose, as well as the date and time that you took it.   How should I plan for travel?  Take your pillbox, medicines, and organization system with you when traveling.  Have your medicines refilled before you travel. This will ensure that you do not run out of your medicines while you are away from home.  Always carry an updated list of your medicines with you. If there is an emergency, a first responder can quickly see what medicines you are taking.  Do not pack your medicines in checked luggage in case your luggage is lost or delayed.  If any of your medicines is considered a controlled substance, make sure you bring a letter from your health care provider with you. How should I store and discard my medicines? For safe storage:  Store medicines in a cool, dry area away from  light, or as directed by your health care provider. Do not store medicines in the bathroom. Heat and humidity will affect them.  Do not store your medicines with other chemicals, or with medicines for pets or other household members.  Keep medicines away from children and pets. Do not leave them on counters or bedside tables. Store them in high cabinets or on high shelves. For safe disposal:  Check expiration dates regularly. Do not take expired medicines. Discard medicines that are older than the expiration date.  Learn a safe way to dispose of your medicines. You may: ? Use a local government, hospital, or pharmacy medicine-take-back program. ? Mix the medicines with inedible substances, put them in a sealed bag or empty container, and throw them in the trash. What should I remember?  Tell your health care provider if you: ? Experience side effects. ? Have new symptoms. ? Have other concerns about taking your medicines.  Review your medicines regularly with your health care  provider. Other medicines, diet, medical conditions, weight changes, and daily habits can all affect how medicines work. Ask if you need to continue taking each medicine, and discuss how well each one is working.  Refill your medicines early to avoid running out of them.  In case of an accidental overdose, call your local Poison Control Center at [phone] or visit your local emergency department immediately. This is important. Summary  Taking your medicines correctly is an important part of managing or preventing medical problems.  You need to make sure that you understand what you are taking a medicine for, as well as how and when you need to take it.  Know your medicines and use a pill organizer to help you take your medicines correctly.  In case of an accidental overdose, call your local Poison Control Center at [phone])420-3038 or visit your local emergency department immediately. This is important. This information is not intended to replace advice given to you by your health care provider. Make sure you discuss any questions you have with your health care provider. Document Revised: 08/19/2017 Document Reviewed: 08/19/2017 Elsevier Patient Education  2021 Elsevier Inc.   Contrast-Induced Nephropathy Contrast-induced nephropathy (CIN) is a rare type of kidney damage caused by the dye that may be used in certain imaging tests. This dye is called a contrast medium. For these tests, the dye may be injected into a person's body to make tissue or blood vessels show up more clearly. A contrast medium may be used in imaging tests such as:  CT scans.  MRIs.  Angiograms. With CIN, the contrast medium may damage kidney cells directly. It may also cause a reaction in the kidneys that decreases blood supply and oxygen to the kidneys. Lack of blood and oxygen causes temporary kidney damage that usually gets better in about 2 weeks. Very rarely, severe damage may require the kidneys to be  filtered by a machine (dialysis). What are the causes? The exact cause of CIN is not known. In some cases, there may be more than one cause. What increases the risk? The following factors may make you more likely to develop this condition:  Having kidney disease before you are exposed to contrast media. This is the main risk factor.  Having other conditions, such as: ? Dehydration. ? Diabetes. ? High blood pressure. ? Low blood pressure. ? Heart disease. ? Anemia. ? A type of blood cancer that affects the kidneys (multiple myeloma).  Older age.  Needing an emergency exam that requires  the use of contrast media.  Receiving a high dose of contrast media or receiving contrast media within 72 hours of the previous time you got it.  Having contrast media injected into a blood vessel that carries blood away from the heart (artery).  Being on a medicine that affects kidney function. These include NSAIDs, some antibiotics, and some cancer drugs. What are the signs or symptoms? Symptoms of this condition often develop 2-3 days after getting contrast media. Symptoms may include:  Feeling tired (fatigue).  Appetite loss.  Swelling of the feet or ankles.  Puffy, swollen eyes.  Dry, itchy skin. In some cases, there are no symptoms. How is this diagnosed? This condition may be diagnosed based on:  Your symptoms and medical history. Your health care provider may suspect CIN if you recently had an imaging test or procedure that included the use of contrast media.  A physical exam.  Blood and urine tests to check for kidney damage. How is this treated? There is no specific treatment for CIN. If you develop CIN, you may be given fluids through an IV that is inserted into one of your veins. This will help keep your kidneys active. Electrolytes may be added to the fluid if you have an electrolyte imbalance. In most cases, CIN will get better in about 2 weeks. In rare cases, you may need  kidney dialysis if kidney failure occurs and urine flow decreases. Even if you need dialysis, the condition usually improves over time. Follow these instructions at home:  Take over-the-counter and prescription medicines only as told by your health care provider. Over-the-counter NSAIDs, such as aspirin or ibuprofen, can increase your risk of CIN.  Return to your normal activities as told by your health care provider. Ask your health care provider what activities are safe for you.  Drink enough fluid to keep your urine pale yellow.  Always let a health care provider know that you have a history of CIN before having any test or procedure that may include contrast material.  Keep all follow-up visits as told by your health care provider. This is important.   How is this prevented? Take the following steps to help reduce your risk for developing CIN:  Tell your health care provider about any risk factors you have for CIN.  Ask if a contrast medium is necessary for imaging tests or procedures you are scheduled to have. Ask if there is an alternative to the test.  Make sure you drink plenty of fluids prior to any imaging tests or procedures that require contrast medium.  If possible, do not undergo multiple imaging tests or procedures that require contrast medium unless enough time has passed between tests.   Contact a health care provider if:  You have any signs or symptoms of CIN.  You are urinating less than usual. Summary  Contrast-induced nephropathy (CIN) is a rare type of kidney damage caused by contrast medium.  The exact cause of CIN is not known. In some cases, there may be more than one cause.  Sometimes CIN does not cause any symptoms. If you do have symptoms, they may start 2-3 days after getting contrast medium.  To confirm the diagnosis, you may have blood tests and urine tests to check for kidney damage.  There is no specific treatment for CIN. In most cases, this  condition will usually get better in about 2 weeks. You may be given IV fluids with electrolytes. In rare cases, dialysis is needed. This information  is not intended to replace advice given to you by your health care provider. Make sure you discuss any questions you have with your health care provider. Document Revised: 12/06/2017 Document Reviewed: 12/06/2017 Elsevier Patient Education  2021 [place].   Aspirin and Your Heart Aspirin is a medicine that prevents the platelets in your blood from sticking together. Platelets are the cells that your blood uses for clotting. Aspirin can be used to help reduce the risk of blood clots, heart attacks, and other heart-related problems. What are the risks? Daily use of aspirin can cause side effects. Some of these include:  Bleeding. Bleeding can be minor or serious. An example of minor bleeding is bleeding from a cut, and the bleeding does not stop. An example of more serious bleeding is stomach bleeding or, rarely, bleeding into the brain. Your risk of bleeding increases if you are also taking NSAIDs, such as ibuprofen.  Increased bruising.  Upset stomach.  An allergic reaction. People who have growths inside the nose (nasal polyps) have an increased risk of developing an aspirin allergy. How to use aspirin to care for your heart  Take aspirin only as told by your health care provider. Make sure that you understand how much to take and what form to take. The two forms of aspirin are: ? Non-enteric-coated.This type of aspirin does not have a coating and is absorbed quickly. This type of aspirin also comes in a chewable form. ? Enteric-coated. This type of aspirin has a coating that releases the medicine very slowly. Enteric-coated aspirin might cause less stomach upset than non-enteric-coated aspirin. This type of aspirin should not be chewed or crushed.  Work with your health care provider to find out whether it is safe and beneficial for you  to take aspirin daily. Taking aspirin daily may be helpful if: ? You have had a heart attack or chest pain, or you are at risk for a heart attack. ? You have a condition in which certain heart vessels are blocked (coronary artery disease), and you have had a procedure to treat it. Examples are:  Open-heart surgery, such as coronary artery bypass surgery (CABG).  Coronary angioplasty,which is done to widen a blood vessel of your heart.  Having a small mesh tube, or stent, placed in your coronary artery. ? You have had certain types of stroke or a mini-stroke known as a transient ischemic attack (TIA). ? You have a narrowing of the arteries that supply the limbs (peripheral artery disease, or PAD). ? You have long-term (chronic) heart rhythm problems, such as atrial fibrillation, and your health care provider thinks aspirin may help. ? You have valve disease or have had surgery on a valve. ? You are considered at increased risk of developing coronary artery disease or PAD.   Follow these instructions at home Medicines  Take over-the-counter and prescription medicines only as told by your health care provider.  If you are taking blood thinners: ? Talk with your health care provider before you take any medicines that contain aspirin or NSAIDs, such as ibuprofen. These medicines increase your risk for dangerous bleeding. ? Take your medicine exactly as told, at the same time every day. ? Avoid activities that could cause injury or bruising, and follow instructions about how to prevent falls. ? Wear a medical alert bracelet or carry a card that lists what medicines you take. General instructions  Do not drink alcohol if: ? Your health care provider tells you not to drink. ? You  are pregnant, may be pregnant, or are planning to become pregnant.  If you drink alcohol: ? Limit how much you use to:  0-1 drink a day for women.  0-2 drinks a day for men. ? Be aware of how much alcohol is in  your drink. In the [place] one drink equals one 12 oz bottle of beer (355 mL), one 5 oz glass of wine (148 mL), or one 1 oz glass of hard liquor (44 mL).  Keep all follow-up visits as told by your health care provider. This is important. Where to find more information  The American Heart Association: www.heart.org Contact a health care provider if you have:  Unusual bleeding or bruising.  Stomach pain or nausea.  Ringing in your ears.  An allergic reaction that causes hives, itchy skin, or swelling of the lips, tongue, or face. Get help right away if:  You notice that your bowel movements are bloody, or dark red or black in color.  You vomit or cough up blood.  You have blood in your urine.  You cough, breathe loudly (wheeze), or feel short of breath.  You have chest pain, especially if the pain spreads to your arms, back, neck, or jaw.  You have a headache with confusion. You have any symptoms of a stroke. "BE FAST" is an easy way to remember the main warning signs of a stroke:  B - Balance. Signs are dizziness, sudden trouble walking, or loss of balance.  E - Eyes. Signs are trouble seeing or a sudden change in vision.  F - Face. Signs are sudden weakness or numbness of the face, or the face or eyelid drooping on one side.  A - Arms. Signs are weakness or numbness in an arm. This happens suddenly and usually on one side of the body.  S - Speech. Signs are sudden trouble speaking, slurred speech, or trouble understanding what people say.  T - Time. Time to call emergency services. Write down what time symptoms started. You have other signs of a stroke, such as:  A sudden, severe headache with no known cause.  Nausea or vomiting.  Seizure. These symptoms may represent a serious problem that is an emergency. Do not wait to see if the symptoms will go away. Get medical help right away. Call your local emergency services (911 in the [place] Do not drive yourself to the  hospital. Summary  Aspirin use can help reduce the risk of blood clots, heart attacks, and other heart-related problems.  Daily use of aspirin can cause side effects.  Take aspirin only as told by your health care provider. Make sure that you understand how much to take and what form to take.  Your health care provider will help you determine whether it is safe and beneficial for you to take aspirin daily. This information is not intended to replace advice given to you by your health care provider. Make sure you discuss any questions you have with your health care provider. Document Revised: 05/29/2019 Document Reviewed: 05/29/2019 Elsevier Patient Education  2021 [place].   Confusion Confusion is the inability to think with your usual speed or clarity. Confusion can be caused by many things. People who are confused often describe their thinking as cloudy or unclear. Confusion can also include feeling disoriented. This means you are unaware of where you are or who you are. You may also not know the date or time. When confused, you may have trouble remembering, paying attention, or making  decisions. Some people also act aggressively when they are confused. In some cases, confusion may come on quickly. In other cases, it may develop slowly over time. Confusion may be caused by medical conditions such as:  Infections, such as a urinary tract infection (UTI).  Low levels of oxygen, which can develop from conditions such as long-term lung disorders.  Decrease in brain function due to dementia and other conditions that affect the brain, such as seizures, strokes, brain tumors, or head injuries.  Mental health conditions, like panic attacks, anxiety, depression, and hallucinations. Confusion may also be caused by physical factors such as:  Loss of fluid (dehydration) or an imbalance of salts and minerals in the body (electrolytes).  Lack of certain nutrients like niacin, thiamine, or  other B vitamins.  Fever or hypothermia, which is a sudden drop in body temperature.  Low or high blood sugar.  Low or high blood pressure. Other causes include:  Lack of sleep or changes in routine or surroundings, such as when traveling or staying in a hospital.  Using too much alcohol, drugs, or medicine.  Side effects of medicines, or taking medicines that affect other medicines (drug interactions). Follow these instructions at home: Pay attention to your symptoms. Tell your health care provider about any changes or if you develop new symptoms. Follow these instructions to control or treat symptoms. Ask a family member or friend for help if needed. Medicines  Take over-the-counter and prescription medicines only as told by your health care provider.  Ask your health care provider about changing or stopping any medicines that may be causing your confusion.  Avoid pain medicines or sleep medicines until you have fully recovered.  Use a pillbox or an alarm to help you take the right medicines at the right time.   Lifestyle  Eat a balanced diet that includes fruits and vegetables.  Get enough sleep. For most adults, this is 7-9 hours each night.  Do not drink alcohol.  Do not become isolated. Spend time with other people and make plans for your days.  Do not drive until your health care provider says that it is safe to do so.  Do not use any products that contain nicotine or tobacco, such as cigarettes, e-cigarettes, and chewing tobacco. If you need help quitting, ask your health care provider.  Stop other activities that may increase your chances of getting hurt. These may include some work duties, sports activities, swimming, or bike riding. Ask your health care provider what activities are safe for you.   Tips for caregivers  Find out if the person is confused. Ask the person to state his or her name, age, and the date. If the person is unsure or answers incorrectly, he  or she may be confused and need assistance.  Always introduce yourself, no matter how well the person knows you. Remind the person of his or her location.  Place a calendar and clock near the person who is confused. Keep a regular schedule. Make sure the person has plenty of light during the day and sleep at night.  Talk about current events and plans for the day.  Keep the environment calm, quiet, and peaceful.  Help the person do the things that he or she is unable to do. These include: ? Taking medicines. ? Keeping medical appointments. ? Helping with household duties, including meal preparation. ? Running errands.  Get help if you need it. There are several support groups for caregivers. If the person you  are helping needs more support, consider day care, extended-care programs, or a skilled nursing facility. The person's health care provider may be able to help evaluate these options. General instructions  Monitor yourself for any conditions you may have. These can include: ? Checking your blood glucose levels if you have diabetes. ? Maintaining a healthy weight. ? Monitoring your blood pressure if you have hypertension. ? Monitoring your body temperature if you have a fever.  Keep all follow-up visits. This is important. Contact a health care provider if:  You have new symptoms or your symptoms get worse. Get help right away if you:  Feel that you are not able to care for yourself.  Develop severe headaches, repeated vomiting, seizures, blackouts, or slurred speech.  Have increasing confusion, weakness, numbness, restlessness, or personality changes.  Develop a loss of balance, have marked dizziness, feel uncoordinated, or fall.  Develop severe anxiety, or you have delusions or hallucinations. These symptoms may represent a serious problem that is an emergency. Do not wait to see if the symptoms will go away. Get medical help right away. Call your local emergency  services (911 in the [place] Do not drive yourself to the hospital. Summary  Confusion is the inability to think with your usual speed or clarity. People who are confused often describe their thinking as cloudy or unclear.  Confusion can also include having trouble remembering, paying attention, or making decisions.  Confusion may come on quickly or develop slowly over time, depending on the cause. There are many different causes of confusion.  Ask for help from family members or friends if you are unable to take care of yourself. This information is not intended to replace advice given to you by your health care provider. Make sure you discuss any questions you have with your health care provider. Document Revised: 12/19/2019 Document Reviewed: 12/19/2019 Elsevier Patient Education  [place].   Correction Insulin Correction insulin, also called a supplemental dose, is a small amount of insulin that can be used to lower your blood sugar (glucose) if it is too high. This dose brings your glucose level back to the target range. You will be instructed to check your glucose at certain times of the day and to use correction insulin as needed to lower your blood glucose. Correction insulin is primarily used as part of diabetes management. It may also be prescribed for people who do not have diabetes. What is a correction scale? A correction scale, also called a sliding scale, is prescribed by your health care provider to help you determine when you need correction insulin. Your correction scale is based on your individual treatment goals, and it has two parts:  Ranges of blood glucose levels.  How much correction insulin to give yourself if your blood sugar falls within a certain range. If your blood glucose is in your desired range, you will not need correction insulin. What type of insulin do I need? You may be prescribed rapid-acting or short-acting insulin as correction insulin. Talk  with your health care provider or pharmacist about which type of correction insulin to take and when to take it. Rapid-acting insulin This insulin:  Starts working in the body (onset) in as little as 15 minutes.  Is at its highest strength (peak) in 1.5-2 hours.  Lasts (duration) for 4-5 hours. Short-acting insulin This insulin:  Starts working in the body (onset) in about 30 minutes.  Is at its highest strength (peak) in 2-3 hours.  Lasts (duration)  for 3-6 hours. How do I manage my blood glucose with correction insulin? Giving a correction dose  Check your blood glucose as directed by your health care provider.  Use your correction scale to find the range that your blood glucose is in.  Identify the units of insulin that match your blood glucose range.  Give yourself the dose of correction insulin that your health care provider has prescribed in your correction scale. Always make sure you are using the right type of insulin.   Keeping a blood glucose log  Write down your blood glucose test results and the amount of insulin that you give yourself. Do this every time you check blood glucose or take insulin. Bring this log with you to your medical visits. This information will help your health care provider manage your medicines.  Note anything that may affect your blood glucose, such as: ? Changes in normal exercise or activity. ? Changes in your normal schedule, such as changes in your sleep routine, going on vacation, changing your diet, or holidays. ? New over-the-counter or prescription medicines. ? Illness, stress, or anxiety. ? Changes in the time that you took your medicine or insulin. ? Changes in your meals, such as skipping a meal, having a late meal, or dining out. ? Eating things that may affect blood glucose, such as snacks, meal portions that are larger than normal, drinks that contain sugar, or eating less than usual.   Why do I need correction insulin if I do  not have diabetes? If you do not have diabetes, your health care provider may prescribe insulin because:  Keeping your blood glucose in the target range is important for your overall health.  You are taking medicines that cause your blood glucose to be higher than normal. Contact a health care provider if:  You have high blood glucose that you are not able to correct with correction insulin.  You develop a low blood glucose that you are not able to treat yourself.  Your blood glucose is often too low. Get help right away if:  You become unresponsive. If this happens, someone else should call emergency services (911 in the [place] right away.  Your blood glucose is lower than 54 mg/dL (3.0 mmol/L).  You become confused or you have trouble thinking clearly.  You have difficulty breathing. Summary  Correction insulin is primarily used in diabetes management. It can also be prescribed for people who do not have diabetes.  Correction insulin is a small amount of insulin that can be used to lower your blood glucose if it is too high. It brings your glucose level to the target range.  You will be instructed to check your blood glucose at certain times of the day and to use correction insulin as needed. Always keep a log of your blood glucose values and the amount of insulin you take.  Talk with your health care provider or pharmacist about the type of correction insulin to take and when to take it. This information is not intended to replace advice given to you by your health care provider. Make sure you discuss any questions you have with your health care provider. Document Revised: 06/26/2019 Document Reviewed: 06/26/2019 Elsevier Patient Education  [place].

## 2020-10-27 NOTE — TOC Progression Note (Signed)
Transition of Care [facility]) - Progression Note    Patient Details  Name: Carolyn Norton MRN: [id number] Date of Birth: Mar 25, 1966  [facility] CM/SW Contact  Armanda Heritage, RN Phone Number: 10/27/2020, 1:47 PM  Clinical Narrative:    Frances Furbish to provide home health PT/OT/RN.   Expected Discharge Plan: Home w Home Health Services Barriers to Discharge: No Barriers Identified  Expected Discharge Plan and Services Expected Discharge Plan: Home w Home Health Services   Discharge Planning Services: CM Consult   Living arrangements for the past 2 months: Single Family Home Expected Discharge Date: 10/27/20                         [facility] Arranged: PT,OT,RN HH Agency: [facility] Date [facility] Agency Contacted: 10/27/20 Time HH Agency Contacted: [phone] Representative spoke with at [facility] Agency: Kandee Keen   Social Determinants of Health (SDOH) Interventions    Readmission Risk Interventions No flowsheet data found.

## 2020-10-27 NOTE — Discharge Summary (Signed)
Physician Discharge Summary  Carolyn Norton JSH:[id number] DOB: 1966-04-02 DOA: 10/22/2020  PCP: Carolyn Pounds, NP  Admit date: 10/22/2020 Discharge date: 10/27/2020  Admitted From: Home Disposition: Home  Recommendations for Outpatient Follow-up:  1. Follow ups as below. 2. Please obtain CBC/BMP/Mag at follow up 3. Please follow up on the following pending results: Vitamin B1 level  Home Health: PT/OT/RN Equipment/Devices: Rolling walker and 3 in 1 commode  Discharge Condition: Stable CODE STATUS: Full code   Follow-up Information    Carolyn Pounds, NP. Schedule an appointment as soon as possible for a visit in 1 week(s).   Specialty: Nurse Practitioner Contact information: [place] [address] [phone]               Hospital Course: 55 year old F with PMH of DM-2, BPD type I, HTN, recent COVID-19 infection and polysubstance use presenting with "seizure-like activity" and admitted for acute metabolic encephalopathy and AKI likely due to cocaine use.  UDS was positive for cocaine.  Encephalopathy thought to be due to cocaine abuse, and resolved.  AKI resolved as well.  Therapy recommended home health with 24/7 supervision.  Home health PT/OT/RN and appropriate DME is ordered.  She was counseled on compliance with the medication and cessation of substance use.  Resources provided.  Discharge plan discussed with patient's mother who will take patient home.   Discharge Diagnoses:  Acute toxic encephalopathy likely due to cocaine abuse.  Some concern about seizure-like activity but no witnessed seizure since arrival.  No focal neuro symptoms. Encephalopathy resolved. -Counseled on substance use cessation.  Cocaine abuse: UDS positive for cocaine. -Advised to stop using cocaine.  Resources provided.  Hypotension: Likely dehydration.  Resolved. -Continue holding home antihypertensive meds  AKI/azotemia on CKD-3A: Likely a mix of  prerenal from dehydration and ATN from cocaine use.    AKI resolved. Recent Labs    09/06/20 1948 09/07/20 0029 09/07/20 7209 09/08/20 0236 10/22/20 1522 10/23/20 0313 10/24/20 0303 10/25/20 0527 10/26/20 0602 10/27/20 0609  BUN 47* 43* 41* 32* 46* 40* 32* 21* 15 15  CREATININE 2.00* 1.65* 1.44* 1.14* 3.42* 2.59* 1.80* 1.37* 1.18* 1.23*  -Advised to hold lisinopril for 4 days. -Recheck renal function at follow-up  Anion gap metabolic acidosis: Likely due to azotemia and AKI.  Resolved.  Hyperkalemia: Resolved.  Could be due to acidosis.  Uncontrolled IDDM-2: A1c 11.6%.  Likely noncompliant with meds. Recent Labs  Lab 10/26/20 1146 10/26/20 1628 10/26/20 2135 10/27/20 0722 10/27/20 1125  GLUCAP 263* 208* 199* 163* 269*  -Advised to use 30 units of Lantus tonight, and resume 45 units tomorrow  Leukocytosis/bandemia: Likely demargination.  Resolved.  Debility/physical deconditioning: Improved. -Home health PT/OT/RN, walker and commode ordered.  Tobacco use disorder -Encourage tobacco cessation.  Resources provided.  Bipolar disorder type I -Continue home medication -Could benefit from outpatient psych follow-up.  Obesity Body mass index is 30.42 kg/m.  -Encourage lifestyle change.          Discharge Exam: Vitals:   10/27/20 0905 10/27/20 0906  BP:    Pulse:    Resp:    Temp:    SpO2: 98% 98%    GENERAL: No apparent distress.  Nontoxic. HEENT: MMM.  Vision and hearing grossly intact.  NECK: Supple.  No apparent JVD.  RESP: On RA.  No IWOB.  Fair aeration bilaterally. CVS:  RRR. Heart sounds normal.  ABD/GI/GU: Bowel sounds present. Soft. Non tender.  MSK/EXT:  Moves extremities. No apparent deformity.  No edema.  SKIN: no apparent skin lesion or wound NEURO: Awake, alert and oriented x4.  Fair insight.  No apparent focal neuro deficit. PSYCH: Calm. Normal affect.  Discharge Instructions  Discharge Instructions    Diet - low sodium heart  healthy   Complete by: As directed    Diet Carb Modified   Complete by: As directed    Discharge instructions   Complete by: As directed    It has been a pleasure taking care of you!  You were hospitalized due to altered mental status/confusion and acute kidney injury likely from cocaine use.  Your mental status and kidney function improved to the point we think it is safe to let you go home and follow-up with your doctors.  We strongly recommend you stop using cocaine, drinking alcohol and smoking cigarettes.  We also recommend you take your medications as prescribed.  Please review your new medication list and the directions before you take them.   You may use nicotine patch to help you quit smoking.  Nicotine patch is available over-the-counter.  You may also discuss other options to help you quit smoking with your primary care doctor. You can also talk to professional counselors at 1-800-QUIT-NOW 225-291-9719) for free smoking cessation counseling.  Please follow-up with your primary care doctor in 1 to 2 weeks.     Take care,   Increase activity slowly   Complete by: As directed      Allergies as of 10/27/2020      Reactions   Erythromycin Other (See Comments)   Childhood reaction      Medication List    TAKE these medications   Advair HFA 230-21 MCG/ACT inhaler Generic drug: fluticasone-salmeterol Inhale 2 puffs into the lungs 2 (two) times daily. puff   albuterol 108 (90 Base) MCG/ACT inhaler Commonly known as: VENTOLIN HFA Inhale 2 puffs into the lungs every 4 (four) hours as needed for wheezing or shortness of breath.   albuterol (2.5 MG/3ML) 0.083% nebulizer solution Commonly known as: PROVENTIL Take 3 mLs (2.5 mg total) by nebulization every 6 (six) hours as needed for wheezing or shortness of breath.   atorvastatin 20 MG tablet Commonly known as: LIPITOR Take 1 tablet (20 mg total) by mouth daily.   Dexilant 30 MG capsule Generic drug:  Dexlansoprazole Take 30 mg by mouth 2 (two) times daily.   divalproex 500 MG DR tablet Commonly known as: DEPAKOTE Take 1 tablet (500 mg total) by mouth 2 (two) times daily.   escitalopram 20 MG tablet Commonly known as: LEXAPRO Take 1 tablet (20 mg total) by mouth daily.   gabapentin 300 MG capsule Commonly known as: NEURONTIN Take 300 mg by mouth 2 (two) times daily.   hydrOXYzine 25 MG tablet Commonly known as: ATARAX/VISTARIL Take 1 tablet (25 mg total) by mouth every 8 (eight) hours as needed for anxiety.   insulin glargine 100 UNIT/ML Solostar Pen Commonly known as: LANTUS Inject 45 Units into the skin daily at 10 pm.   lisinopril 20 MG tablet Commonly known as: ZESTRIL Take 1 tablet (20 mg total) by mouth daily. Start taking on: October 31, 2020 What changed:   These instructions start on October 31, 2020. If you are unsure what to do until then, ask your doctor or other care provider.  Another medication with the same name was removed. Continue taking this medication, and follow the directions you see here.   loratadine 10 MG tablet Commonly known as: CLARITIN Take 1 tablet (  10 mg total) by mouth daily.   montelukast 10 MG tablet Commonly known as: SINGULAIR Take 1 tablet (10 mg total) by mouth at bedtime.   oxybutynin 5 MG tablet Commonly known as: DITROPAN TAKE ONE TABLET BY MOUTH EVERY 6 TO 8 HOURS FOR URINARY FREQUENCY / URGENCY What changed: See the new instructions.   Simethicone 180 MG Caps Take 180 mg by mouth 3 (three) times daily.   thiamine 100 MG tablet Take 1 tablet (100 mg total) by mouth daily.   True Metrix Blood Glucose Test test strip Generic drug: glucose blood Use as instructed. Check blood glucose level by fingerstick twice per day.   True Metrix Meter w/Device Kit Use as instructed. Check blood glucose level by fingerstick twice per day.   TRUEplus Lancets 28G Misc Use as instructed. Check blood glucose level by fingerstick  twice per day.            Durable Medical Equipment  (From admission, onward)         Start     Ordered   10/25/20 1100  For home use only DME Bedside commode  Once       Question:  Patient needs a bedside commode to treat with the following condition  Answer:  Unsteady gait   10/25/20 1059   10/25/20 1059  For home use only DME Walker rolling  Once       Question Answer Comment  Walker: With 5 Inch Wheels   Patient needs a walker to treat with the following condition Unsteady gait      10/25/20 1058          Consultations:  None  Procedures/Studies:   CT Head Wo Contrast  Result Date: 10/22/2020 CLINICAL DATA:  Delirium.  Possible seizure. EXAM: CT HEAD WITHOUT CONTRAST TECHNIQUE: Contiguous axial images were obtained from the base of the skull through the vertex without intravenous contrast. COMPARISON:  09/06/2020 FINDINGS: Brain: There is no evidence of an acute infarct, intracranial hemorrhage, mass, midline shift, or extra-axial fluid collection. Lateral ventriculomegaly is unchanged and favored to reflect central predominant cerebral atrophy although normal pressure hydrocephalus is not excluded in the appropriate clinical setting. Hypodensities in the cerebral white matter bilaterally are unchanged and nonspecific but compatible with mild chronic small vessel ischemic disease. Vascular: Calcified atherosclerosis at the skull base. No hyperdense vessel. Skull: No fracture or suspicious osseous lesion. Sinuses/Orbits: Paranasal sinuses and mastoid air cells are clear. Unremarkable orbits. Other: None. IMPRESSION: 1. No evidence of acute intracranial abnormality. 2. Stable chronic findings as above. Electronically Signed   By: Allen  Grady M.D.   On: 10/22/2020 17:40   DG Chest Portable 1 View  Result Date: 10/22/2020 CLINICAL DATA:  Altered mental status, possible seizure. EXAM: PORTABLE CHEST 1 VIEW COMPARISON:  09/06/2020 FINDINGS: The left hemidiaphragm is omitted.  The remainder of the lungs appears clear. The patient is rotated to the right on today's radiograph, reducing diagnostic sensitivity and specificity. Upper normal heart size. IMPRESSION: 1. No significant abnormality identified. Please note that the left lung base is omitted. Electronically Signed   By: Walter  Liebkemann M.D.   On: 10/22/2020 15:06       The results of significant diagnostics from this hospitalization (including imaging, microbiology, ancillary and laboratory) are listed below for reference.     Microbiology: Recent Results (from the past 240 hour(s))  Urine culture     Status: Abnormal   Collection Time: 10/22/20  6:17 PM   Specimen: Urine, Random    Result Value Ref Range Status   Specimen Description   Final    URINE, RANDOM Performed at [facility], [address] [place], [place] [address]    Special Requests   Final    NONE Performed at [facility], [address] [place], [place] [address]    Culture (A)  Final    <10,000 COLONIES/mL INSIGNIFICANT GROWTH Performed at [facility], [address] [place], [place] [address]    Report Status 10/24/2020 FINAL  Final  Culture, blood (routine x 2)     Status: None   Collection Time: 10/22/20  6:17 PM   Specimen: BLOOD  Result Value Ref Range Status   Specimen Description   Final    BLOOD RIGHT ANTECUBITAL Performed at [facility], [address] [place], [place] [address]    Special Requests   Final    BOTTLES DRAWN AEROBIC AND ANAEROBIC Blood Culture adequate volume [facility], [address] [place], [place] [address]    Culture   Final    NO GROWTH 5 DAYS Performed at [facility], [address] [place], [place] [address]    Report Status 10/27/2020 FINAL  Final  Resp Panel by RT-PCR (Flu A&B, Covid) Nasopharyngeal Swab     Status: None   Collection Time: 10/22/20  8:00 PM   Specimen:  Nasopharyngeal Swab; Nasopharyngeal(NP) swabs in vial transport medium  Result Value Ref Range Status   SARS Coronavirus 2 by RT PCR NEGATIVE NEGATIVE Final    Comment: (NOTE) SARS-CoV-2 target nucleic acids are NOT DETECTED.  The SARS-CoV-2 RNA is generally detectable in upper respiratory specimens during the acute phase of infection. The lowest concentration of SARS-CoV-2 viral copies this assay can detect is 138 copies/mL. A negative result does not preclude SARS-Cov-2 infection and should not be used as the sole basis for treatment or other patient management decisions. A negative result may occur with  improper specimen collection/handling, submission of specimen other than nasopharyngeal swab, presence of viral mutation(s) within the areas targeted by this assay, and inadequate number of viral copies(<138 copies/mL). A negative result must be combined with clinical observations, patient history, and epidemiological information. The expected result is Negative.  Fact Sheet for Patients:  https://www.fda.gov/media/152166/download  Fact Sheet for Healthcare Providers:  https://www.fda.gov/media/152162/download  This test is no t yet approved or cleared by the [place] FDA and  has been authorized for detection and/or diagnosis of SARS-CoV-2 by FDA under an Emergency Use Authorization (EUA). This EUA will remain  in effect (meaning this test can be used) for the duration of the COVID-19 declaration under Section 564(b)(1) of the Act, 21 U.S.C.section 360bbb-3(b)(1), unless the authorization is terminated  or revoked sooner.       Influenza A by PCR NEGATIVE NEGATIVE Final   Influenza B by PCR NEGATIVE NEGATIVE Final    Comment: (NOTE) The Xpert Xpress SARS-CoV-2/FLU/RSV plus assay is intended as an aid in the diagnosis of influenza from Nasopharyngeal swab specimens and should not be used as a sole basis for treatment. Nasal washings and aspirates are unacceptable for  Xpert Xpress SARS-CoV-2/FLU/RSV testing.  Fact Sheet for Patients: https://www.fda.gov/media/152166/download  Fact Sheet for Healthcare Providers: https://www.fda.gov/media/152162/download  This test is not yet approved or cleared by the [place] FDA and has been authorized for detection and/or diagnosis of SARS-CoV-2 by FDA under an Emergency Use Authorization (EUA). This EUA will remain in effect (meaning this   test can be used) for the duration of the COVID-19 declaration under Section 564(b)(1) of the Act, 21 U.S.C. section 360bbb-3(b)(1), unless the authorization is terminated or revoked.  Performed at [facility], [address] [place], [place] [address]   Culture, blood (routine x 2)     Status: None (Preliminary result)   Collection Time: 10/23/20  8:27 AM   Specimen: BLOOD  Result Value Ref Range Status   Specimen Description   Final    BLOOD BLOOD LEFT FOREARM Performed at [facility], [address] [place], [place] [address]    Special Requests   Final    BOTTLES DRAWN AEROBIC AND ANAEROBIC Blood Culture adequate volume Performed at [facility], [address] [place], [place] [address]    Culture   Final    NO GROWTH 4 DAYS Performed at [facility], [address] , [place] [address]    Report Status PENDING  Incomplete     Labs:  CBC: Recent Labs  Lab 10/22/20 1522 10/23/20 0313 10/24/20 0303 10/27/20 0609  WBC 18.7* 10.9* 10.5 5.0  NEUTROABS 12.1*  --   --   --   HGB 13.0 14.0 12.9 12.5  HCT 38.2 43.4 40.7 38.7  MCV 83.2 88.4 88.7 86.0  PLT 229 175 156 162   BMP &GFR Recent Labs  Lab 10/23/20 0313 10/24/20 0303 10/25/20 0527 10/26/20 0602 10/27/20 0609  NA 139 141 137 139 137  K 5.4* 4.7 4.8 4.0 3.7  CL 99 101 99 101 100  CO2 27 28 28 28 29  GLUCOSE 121* 139* 223* 191* 174*  BUN 40* 32* 21* 15 15  CREATININE 2.59* 1.80* 1.37* 1.18* 1.23*  CALCIUM 9.3  8.9 8.8* 8.7* 8.6*  MG  --  1.7 1.8 1.8 1.9  PHOS  --  2.5 2.3* 3.6 3.5   Estimated Creatinine Clearance: 58.3 mL/min (A) (by C-G formula based on SCr of 1.23 mg/dL (H)). Liver & Pancreas: Recent Labs  Lab 10/22/20 1522 10/23/20 0313 10/24/20 0303 10/25/20 0527 10/26/20 0602 10/27/20 0609  AST 16 25  --   --   --   --   ALT 12 13  --   --   --   --   ALKPHOS 69 57  --   --   --   --   BILITOT 0.6 0.5  --   --   --   --   PROT 8.0 7.7  --   --   --   --   ALBUMIN 4.2 3.6 3.0* 3.2* 2.8* 2.9*   No results for input(s): LIPASE, AMYLASE in the last 168 hours. No results for input(s): AMMONIA in the last 168 hours. Diabetic: No results for input(s): HGBA1C in the last 72 hours. Recent Labs  Lab 10/26/20 1146 10/26/20 1628 10/26/20 2135 10/27/20 0722 10/27/20 1125  GLUCAP 263* 208* 199* 163* 269*   Cardiac Enzymes: Recent Labs  Lab 10/22/20 1630 10/24/20 0303  CKTOTAL 303* 179   No results for input(s): PROBNP in the last 8760 hours. Coagulation Profile: No results for input(s): INR, PROTIME in the last 168 hours. Thyroid Function Tests: Recent Labs    10/26/20 0602  TSH 0.549   Lipid Profile: No results for input(s): CHOL, HDL, LDLCALC, TRIG, CHOLHDL, LDLDIRECT in the last 72 hours. Anemia Panel: Recent Labs    10/26/20 0602  VITAMINB12 714   Urine analysis:    Component Value Date/Time     COLORURINE YELLOW 10/22/2020 1817   APPEARANCEUR HAZY (A) 10/22/2020 1817   LABSPEC 1.025 10/22/2020 1817   PHURINE 5.0 10/22/2020 1817   GLUCOSEU 150 (A) 10/22/2020 1817   HGBUR MODERATE (A) 10/22/2020 1817   BILIRUBINUR NEGATIVE 10/22/2020 1817   KETONESUR 5 (A) 10/22/2020 1817   PROTEINUR 30 (A) 10/22/2020 1817   UROBILINOGEN 0.2 07/20/2014 2022   NITRITE NEGATIVE 10/22/2020 1817   LEUKOCYTESUR NEGATIVE 10/22/2020 1817   Sepsis Labs: Invalid input(s): PROCALCITONIN, LACTICIDVEN   Time coordinating discharge: 45 minutes  SIGNED:  Taye T Gonfa, MD  [facility] 10/27/2020, 5:43 PM  If 7PM-7AM, please contact night-coverage [email or web address] 

## 2020-10-27 NOTE — Progress Notes (Signed)
Patient was restless this AM, walking in the hall without a mask and stating she was going to leave. She was instructed to go back to her room and that the MD was going to call her mother and advised her the patient was going to be discharged. Discharge instructions were explained to the patient and she states understanding them. I spoke to the patient's mother and she stated her daughter takes her own medications. Prescriptions to be picked up at her pharmacy.Patient discharged via wheelchair to parents.

## 2020-10-28 ENCOUNTER — Telehealth: Payer: Self-pay

## 2020-10-28 LAB — CULTURE, BLOOD (ROUTINE X 2)
Culture: NO GROWTH
Special Requests: ADEQUATE

## 2020-10-28 NOTE — Telephone Encounter (Signed)
Transition Care Management Follow-up Telephone Call  Date of discharge and from where: 10/27/2020, [facility].   How have you been since you were released from the hospital? The patient said she follows up with Dr Christin Fudge at [facility] [place]. When asked what type of provider she sees at [facility] she said she did not know if it was behavioral health or primary care. She stated that she did not want to follow up at [facility] and appointment with Ms Meredeth Ide, NP cancelled for 11/06/2020 and Ms Meredeth Ide, NP was removed as her PCP.    Informed the patient that she can call [facility] to schedule an appointment again if she changes her mind.

## 2020-11-04 LAB — VITAMIN B1: Vitamin B1 (Thiamine): 117 nmol/L (ref 66.5–200.0)

## 2020-11-06 ENCOUNTER — Telehealth: Payer: Self-pay | Admitting: Nurse Practitioner

## 2020-11-21 ENCOUNTER — Other Ambulatory Visit: Payer: Self-pay

## 2020-11-21 ENCOUNTER — Emergency Department (HOSPITAL_COMMUNITY)
Admission: EM | Admit: 2020-11-21 | Discharge: 2020-11-22 | Disposition: A | Discharge status: Home / Self Care | Payer: Self-pay | Attending: Emergency Medicine | Admitting: Emergency Medicine

## 2020-11-21 DIAGNOSIS — E1165 Type 2 diabetes mellitus with hyperglycemia: Secondary | ICD-10-CM | POA: Insufficient documentation

## 2020-11-21 DIAGNOSIS — R739 Hyperglycemia, unspecified: Secondary | ICD-10-CM

## 2020-11-21 DIAGNOSIS — E785 Hyperlipidemia, unspecified: Secondary | ICD-10-CM | POA: Insufficient documentation

## 2020-11-21 DIAGNOSIS — Z7951 Long term (current) use of inhaled steroids: Secondary | ICD-10-CM | POA: Insufficient documentation

## 2020-11-21 DIAGNOSIS — Z79899 Other long term (current) drug therapy: Secondary | ICD-10-CM | POA: Insufficient documentation

## 2020-11-21 DIAGNOSIS — F17210 Nicotine dependence, cigarettes, uncomplicated: Secondary | ICD-10-CM | POA: Insufficient documentation

## 2020-11-21 DIAGNOSIS — Z794 Long term (current) use of insulin: Secondary | ICD-10-CM | POA: Insufficient documentation

## 2020-11-21 DIAGNOSIS — Z8616 Personal history of COVID-19: Secondary | ICD-10-CM | POA: Insufficient documentation

## 2020-11-21 DIAGNOSIS — E1100 Type 2 diabetes mellitus with hyperosmolarity without nonketotic hyperglycemic-hyperosmolar coma (NKHHC): Secondary | ICD-10-CM | POA: Insufficient documentation

## 2020-11-21 DIAGNOSIS — E1169 Type 2 diabetes mellitus with other specified complication: Secondary | ICD-10-CM | POA: Insufficient documentation

## 2020-11-21 DIAGNOSIS — J45909 Unspecified asthma, uncomplicated: Secondary | ICD-10-CM | POA: Insufficient documentation

## 2020-11-21 DIAGNOSIS — I10 Essential (primary) hypertension: Secondary | ICD-10-CM | POA: Insufficient documentation

## 2020-11-21 LAB — CBG MONITORING, ED: Glucose-Capillary: 517 mg/dL (ref 70–99)

## 2020-11-21 MED ORDER — ONDANSETRON HCL 4 MG PO TABS
4.0000 mg | ORAL_TABLET | Freq: Once | ORAL | Status: AC
  Administered 2020-11-22: 4 mg via ORAL
  Filled 2020-11-21: qty 1

## 2020-11-21 NOTE — ED Triage Notes (Signed)
Brought in by [facility] from home, elevated blood sugar - with EMS BGL 563mg /dl. Received 500cc saline. Pt also reports headache and nausea, increased urination. [id number] at this time.

## 2020-11-22 LAB — CBC
HCT: 41.3 % (ref 36.0–46.0)
Hemoglobin: 14.5 g/dL (ref 12.0–15.0)
MCH: 28.7 pg (ref 26.0–34.0)
MCHC: 35.1 g/dL (ref 30.0–36.0)
MCV: 81.6 fL (ref 80.0–100.0)
Platelets: 277 10*3/uL (ref 150–400)
RBC: 5.06 MIL/uL (ref 3.87–5.11)
RDW: 14.6 % (ref 11.5–15.5)
WBC: 9.2 10*3/uL (ref 4.0–10.5)
nRBC: 0 % (ref 0.0–0.2)

## 2020-11-22 LAB — BASIC METABOLIC PANEL
Anion gap: 12 (ref 5–15)
BUN: 16 mg/dL (ref 6–20)
CO2: 24 mmol/L (ref 22–32)
Calcium: 9.5 mg/dL (ref 8.9–10.3)
Chloride: 90 mmol/L — ABNORMAL LOW (ref 98–111)
Creatinine, Ser: 2.18 mg/dL — ABNORMAL HIGH (ref 0.44–1.00)
GFR, Estimated: 26 mL/min — ABNORMAL LOW (ref >60)
Glucose, Bld: 509 mg/dL (ref 70–99)
Potassium: 3.9 mmol/L (ref 3.5–5.1)
Sodium: 126 mmol/L — ABNORMAL LOW (ref 135–145)

## 2020-11-22 LAB — I-STAT BETA HCG BLOOD, ED (MC, WL, AP ONLY): I-stat hCG, quantitative: <5 m[IU]/mL (ref <5)

## 2020-11-22 LAB — CBG MONITORING, ED
Glucose-Capillary: 481 mg/dL — ABNORMAL HIGH (ref 70–99)
Glucose-Capillary: 414 mg/dL — ABNORMAL HIGH (ref 70–99)
Glucose-Capillary: 217 mg/dL — ABNORMAL HIGH (ref 70–99)

## 2020-11-22 MED ORDER — INSULIN ASPART 100 UNIT/ML ~~LOC~~ SOLN
10.0000 [IU] | Freq: Once | SUBCUTANEOUS | Status: AC
  Administered 2020-11-22: 10 [IU] via INTRAVENOUS

## 2020-11-22 MED ORDER — LACTATED RINGERS IV BOLUS
2000.0000 mL | Freq: Once | INTRAVENOUS | Status: AC
  Administered 2020-11-22: 2000 mL via INTRAVENOUS

## 2020-11-22 MED ORDER — LACTATED RINGERS IV BOLUS
1000.0000 mL | Freq: Once | INTRAVENOUS | Status: AC
  Administered 2020-11-22: 1000 mL via INTRAVENOUS

## 2020-11-23 NOTE — ED Provider Notes (Signed)
[facility] EMERGENCY DEPARTMENT Provider Note   CSN: [id number] Arrival date & time: 11/21/20  2332     History Chief Complaint  Patient presents with  . Hyperglycemia    Carolyn Norton is a 55 y.o. female.   Hyperglycemia Blood sugar level PTA:  >500 Severity:  Moderate Onset quality:  Gradual Timing:  Constant Progression:  Worsening Chronicity:  New Diabetes status:  Controlled with insulin Context: change in medication   Relieved by:  None tried Ineffective treatments:  None tried Associated symptoms: no abdominal pain, no blurred vision and no fever        Past Medical History:  Diagnosis Date  . Anxiety   . Asthma   . Depression   . Diabetes mellitus without complication ([place])   . Hyperlipidemia   . Hypertension   . Seasonal allergies     Patient Active Problem List   Diagnosis Date Noted  . Acute metabolic encephalopathy 46/56/8127  . Hypotension 10/22/2020  . Leukocytosis 10/22/2020  . Type 2 diabetes mellitus ([place]) 10/22/2020  . Altered mental status   . Hyperosmolar hyperglycemic state (HHS) ([place])   . Hyperglycemia   . Hyperkalemia   . AKI (acute kidney injury) ([place])   . Essential hypertension   . DKA (diabetic ketoacidosis) ([place]) 09/06/2020  . COVID-19 09/06/2020  . Bipolar 1 disorder ([place]) 09/06/2020  . Breast mass status post excision 09/06/2020  . Smoker 09/06/2020  . Metabolic acidosis due to ingestion of drugs or chemicals 09/06/2020    Past Surgical History:  Procedure Laterality Date  . BREAST EXCISIONAL BIOPSY Right   . BREAST SURGERY     right breast     OB History    Gravida  0   Para  0   Term  0   Preterm  0   AB  0   Living  0     SAB  0   IAB  0   Ectopic  0   Multiple  0   Live Births  0           Family History  Problem Relation Age of Onset  . Hypertension Mother   . Cancer Mother   . Kidney cancer Mother   . Cancer Father        prostate  . Breast cancer  Paternal Grandmother   . Leukemia Brother     Social History   Tobacco Use  . Smoking status: Current Every Day Smoker    Packs/day: 0.50    Types: Cigarettes  . Smokeless tobacco: Never Used  Vaping Use  . Vaping Use: Never used  Substance Use Topics  . Alcohol use: No  . Drug use: Yes    Types: Cocaine    Home Medications Prior to Admission medications   Medication Sig Start Date End Date Taking? Authorizing Provider  ADVAIR HFA 230-21 MCG/ACT inhaler Inhale 2 puffs into the lungs 2 (two) times daily. puff 09/11/20  Yes [provider]  albuterol (PROVENTIL HFA;VENTOLIN HFA) 108 (90 Base) MCG/ACT inhaler Inhale 2 puffs into the lungs every 4 (four) hours as needed for wheezing or shortness of breath. 10/21/16  Yes Muthersbaugh, Jarrett Soho, PA-C  albuterol (PROVENTIL) (2.5 MG/3ML) 0.083% nebulizer solution Take 3 mLs (2.5 mg total) by nebulization every 6 (six) hours as needed for wheezing or shortness of breath. 09/13/20  Yes [name], NP  atorvastatin (LIPITOR) 20 MG tablet Take 1 tablet (20 mg total) by mouth daily. 07/25/20  Yes [name], NP  Blood Glucose Monitoring Suppl (TRUE METRIX METER) w/Device KIT Use as instructed. Check blood glucose level by fingerstick twice per day. 07/25/20  Yes [name], NP  Dexlansoprazole (DEXILANT) 30 MG capsule Take 30 mg by mouth 2 (two) times daily.   Yes [provider]  escitalopram (LEXAPRO) 20 MG tablet Take 1 tablet (20 mg total) by mouth daily. 10/27/20 04/25/21 Yes Mercy Riding, MD  gabapentin (NEURONTIN) 300 MG capsule Take 300 mg by mouth 2 (two) times daily.   Yes [provider]  glucose blood (TRUE METRIX BLOOD GLUCOSE TEST) test strip Use as instructed. Check blood glucose level by fingerstick twice per day. 07/25/20  Yes [name], NP  hydrOXYzine (ATARAX/VISTARIL) 25 MG tablet Take 1 tablet (25 mg total) by mouth every 8 (eight) hours as needed for anxiety. 07/25/20  Yes [name], NP  insulin glargine (LANTUS) 100 UNIT/ML Solostar Pen Inject 45 Units into the skin daily at 10 pm. Patient taking differently: Inject 50 Units into the skin daily at 10 pm. 07/25/20 11/02/20 Yes Charlott Rakes, MD  lisinopril (ZESTRIL) 20 MG tablet Take 1 tablet (20 mg total) by mouth daily. 10/31/20 11/30/20 Yes Mercy Riding, MD  montelukast (SINGULAIR) 10 MG tablet Take 1 tablet (10 mg total) by mouth at bedtime. 07/25/20 10/23/20 Yes [name], NP  oxybutynin (DITROPAN) 5 MG tablet TAKE ONE TABLET BY MOUTH EVERY 6 TO 8 HOURS FOR URINARY FREQUENCY / URGENCY Patient taking differently: Take 5 mg by mouth See admin instructions. 37m every 12 hours And 513mevery 6 hours as needed for urinary frequency/urgency 09/25/20  Yes McKenzie, PaCandee FurbishMD  TRUEplus Lancets 28G MISC Use as instructed. Check blood glucose level by fingerstick twice per day. 07/25/20  Yes FlGildardo PoundsNP  loratadine (CLARITIN) 10 MG tablet Take 1 tablet (10 mg total) by mouth daily. Patient not taking: No sig reported 09/13/20   FlGildardo PoundsNP    Allergies    Erythromycin  Review of Systems   Review of Systems  Constitutional: Negative for fever.  Eyes: Negative for blurred vision.  Gastrointestinal: Negative for abdominal pain.  All other systems reviewed and are negative.   Physical Exam Updated Vital Signs BP 124/64   Pulse 66   Temp 98.3 F (36.8 C) (Oral)   Resp 10   Ht 5' 6"  (1.676 m)   Wt 85.5 kg   LMP 01/26/2017 (Approximate)   SpO2 99%   BMI 30.42 kg/m   Physical Exam Vitals and nursing note reviewed.  Constitutional:      Appearance: She is well-developed.  HENT:     Head: Normocephalic and atraumatic.     Nose: No congestion or rhinorrhea.     Mouth/Throat:     Mouth: Mucous membranes are moist.     Pharynx: Oropharynx is clear.  Eyes:     Pupils: Pupils are equal, round, and reactive to light.  Cardiovascular:     Rate and Rhythm: Normal rate and regular  rhythm.  Pulmonary:     Effort: No respiratory distress.     Breath sounds: No stridor.  Abdominal:     General: Abdomen is flat. There is no distension.  Musculoskeletal:        General: No swelling or tenderness. Normal range of motion.     Cervical back: Normal range of motion.  Skin:    General: Skin is warm and dry.  Coloration: Skin is not jaundiced or pale.  Neurological:     General: No focal deficit present.     Mental Status: She is alert.     ED Results / Procedures / Treatments   Labs (all labs ordered are listed, but only abnormal results are displayed) Labs Reviewed  BASIC METABOLIC PANEL - Abnormal; Notable for the following components:      Result Value   Sodium 126 (*)    Chloride 90 (*)    Glucose, Bld 509 (*)    Creatinine, Ser 2.18 (*)    GFR, Estimated 26 (*)    All other components within normal limits  CBG MONITORING, ED - Abnormal; Notable for the following components:   Glucose-Capillary 517 (*)    All other components within normal limits  CBG MONITORING, ED - Abnormal; Notable for the following components:   Glucose-Capillary 481 (*)    All other components within normal limits  CBG MONITORING, ED - Abnormal; Notable for the following components:   Glucose-Capillary 414 (*)    All other components within normal limits  CBG MONITORING, ED - Abnormal; Notable for the following components:   Glucose-Capillary 217 (*)    All other components within normal limits  CBC  I-STAT BETA HCG BLOOD, ED ([facility], [facility], [facility] ONLY)    EKG None  Radiology No results found.  Procedures Procedures   Medications Ordered in ED Medications  ondansetron (ZOFRAN) tablet 4 mg (4 mg Oral Given 11/22/20 0005)  lactated ringers bolus 2,000 mL (0 mLs Intravenous Stopped 11/22/20 0320)  insulin aspart (novoLOG) injection 10 Units (10 Units Intravenous Given 11/22/20 0347)  lactated ringers bolus 1,000 mL (0 mLs Intravenous Stopped 11/22/20 0647)    ED Course  I have  reviewed the triage vital signs and the nursing notes.  Pertinent labs & imaging results that were available during my care of the patient were reviewed by me and considered in my medical decision making (see chart for details).    MDM Rules/Calculators/A&P                          Patient with hyperglycemia.  Has been going on for few days but today was over 500 so she presents here for further evaluation.  After multiple liters of fluid and insulin her blood sugar significantly improved.  Blood pressure was initially low but low likely related dehydration because of polyuria and that improved as well.  Patient was tolerating p.o.  Patient was not in DKA.  She appeared well.  Stable for discharge with PCP follow-up for further medication management.  Final Clinical Impression(s) / ED Diagnoses Final diagnoses:  Hyperglycemia    Rx / DC Orders ED Discharge Orders    None       Mesner, Corene Cornea, MD 11/23/20 [phone]

## 2020-12-13 ENCOUNTER — Other Ambulatory Visit: Payer: Self-pay

## 2021-01-11 ENCOUNTER — Observation Stay (HOSPITAL_COMMUNITY): Payer: Medicaid Other

## 2021-01-11 ENCOUNTER — Inpatient Hospital Stay (HOSPITAL_COMMUNITY)
Admission: EM | Admit: 2021-01-11 | Discharge: 2021-04-30 | DRG: 070 | Disposition: A | Discharge status: Skilled Nursing Facility | Payer: Medicaid Other | Attending: Internal Medicine | Admitting: Internal Medicine

## 2021-01-11 ENCOUNTER — Encounter (HOSPITAL_COMMUNITY): Payer: Self-pay

## 2021-01-11 ENCOUNTER — Other Ambulatory Visit: Payer: Self-pay

## 2021-01-11 DIAGNOSIS — E875 Hyperkalemia: Secondary | ICD-10-CM | POA: Diagnosis present

## 2021-01-11 DIAGNOSIS — K5900 Constipation, unspecified: Secondary | ICD-10-CM | POA: Diagnosis present

## 2021-01-11 DIAGNOSIS — R4587 Impulsiveness: Secondary | ICD-10-CM | POA: Diagnosis not present

## 2021-01-11 DIAGNOSIS — E782 Mixed hyperlipidemia: Secondary | ICD-10-CM

## 2021-01-11 DIAGNOSIS — R1312 Dysphagia, oropharyngeal phase: Secondary | ICD-10-CM | POA: Diagnosis present

## 2021-01-11 DIAGNOSIS — R414 Neurologic neglect syndrome: Secondary | ICD-10-CM | POA: Diagnosis present

## 2021-01-11 DIAGNOSIS — E785 Hyperlipidemia, unspecified: Secondary | ICD-10-CM | POA: Diagnosis present

## 2021-01-11 DIAGNOSIS — I10 Essential (primary) hypertension: Secondary | ICD-10-CM | POA: Diagnosis present

## 2021-01-11 DIAGNOSIS — E876 Hypokalemia: Secondary | ICD-10-CM | POA: Diagnosis present

## 2021-01-11 DIAGNOSIS — Z9114 Patient's other noncompliance with medication regimen: Secondary | ICD-10-CM

## 2021-01-11 DIAGNOSIS — N390 Urinary tract infection, site not specified: Secondary | ICD-10-CM | POA: Diagnosis not present

## 2021-01-11 DIAGNOSIS — F1423 Cocaine dependence with withdrawal: Secondary | ICD-10-CM | POA: Diagnosis not present

## 2021-01-11 DIAGNOSIS — R296 Repeated falls: Secondary | ICD-10-CM | POA: Diagnosis present

## 2021-01-11 DIAGNOSIS — Z4659 Encounter for fitting and adjustment of other gastrointestinal appliance and device: Secondary | ICD-10-CM

## 2021-01-11 DIAGNOSIS — K0889 Other specified disorders of teeth and supporting structures: Secondary | ICD-10-CM | POA: Diagnosis present

## 2021-01-11 DIAGNOSIS — R32 Unspecified urinary incontinence: Secondary | ICD-10-CM | POA: Diagnosis present

## 2021-01-11 DIAGNOSIS — Z781 Physical restraint status: Secondary | ICD-10-CM

## 2021-01-11 DIAGNOSIS — I129 Hypertensive chronic kidney disease with stage 1 through stage 4 chronic kidney disease, or unspecified chronic kidney disease: Secondary | ICD-10-CM | POA: Diagnosis present

## 2021-01-11 DIAGNOSIS — E860 Dehydration: Secondary | ICD-10-CM | POA: Diagnosis present

## 2021-01-11 DIAGNOSIS — G3189 Other specified degenerative diseases of nervous system: Secondary | ICD-10-CM | POA: Diagnosis present

## 2021-01-11 DIAGNOSIS — Z803 Family history of malignant neoplasm of breast: Secondary | ICD-10-CM

## 2021-01-11 DIAGNOSIS — Z6824 Body mass index (BMI) 24.0-24.9, adult: Secondary | ICD-10-CM

## 2021-01-11 DIAGNOSIS — G7281 Critical illness myopathy: Secondary | ICD-10-CM | POA: Diagnosis present

## 2021-01-11 DIAGNOSIS — K219 Gastro-esophageal reflux disease without esophagitis: Secondary | ICD-10-CM | POA: Diagnosis present

## 2021-01-11 DIAGNOSIS — Z79899 Other long term (current) drug therapy: Secondary | ICD-10-CM

## 2021-01-11 DIAGNOSIS — F09 Unspecified mental disorder due to known physiological condition: Secondary | ICD-10-CM | POA: Diagnosis present

## 2021-01-11 DIAGNOSIS — U099 Post covid-19 condition, unspecified: Secondary | ICD-10-CM | POA: Diagnosis present

## 2021-01-11 DIAGNOSIS — R0602 Shortness of breath: Secondary | ICD-10-CM

## 2021-01-11 DIAGNOSIS — G319 Degenerative disease of nervous system, unspecified: Secondary | ICD-10-CM | POA: Diagnosis present

## 2021-01-11 DIAGNOSIS — Z66 Do not resuscitate: Secondary | ICD-10-CM | POA: Diagnosis present

## 2021-01-11 DIAGNOSIS — Z0189 Encounter for other specified special examinations: Secondary | ICD-10-CM

## 2021-01-11 DIAGNOSIS — R531 Weakness: Secondary | ICD-10-CM

## 2021-01-11 DIAGNOSIS — G912 (Idiopathic) normal pressure hydrocephalus: Secondary | ICD-10-CM | POA: Diagnosis present

## 2021-01-11 DIAGNOSIS — Z9111 Patient's noncompliance with dietary regimen: Secondary | ICD-10-CM

## 2021-01-11 DIAGNOSIS — R0902 Hypoxemia: Secondary | ICD-10-CM

## 2021-01-11 DIAGNOSIS — E1122 Type 2 diabetes mellitus with diabetic chronic kidney disease: Secondary | ICD-10-CM | POA: Diagnosis present

## 2021-01-11 DIAGNOSIS — F3176 Bipolar disorder, in full remission, most recent episode depressed: Secondary | ICD-10-CM | POA: Diagnosis present

## 2021-01-11 DIAGNOSIS — G928 Other toxic encephalopathy: Secondary | ICD-10-CM | POA: Diagnosis present

## 2021-01-11 DIAGNOSIS — J441 Chronic obstructive pulmonary disease with (acute) exacerbation: Secondary | ICD-10-CM | POA: Diagnosis present

## 2021-01-11 DIAGNOSIS — T380X5A Adverse effect of glucocorticoids and synthetic analogues, initial encounter: Secondary | ICD-10-CM | POA: Diagnosis not present

## 2021-01-11 DIAGNOSIS — D696 Thrombocytopenia, unspecified: Secondary | ICD-10-CM | POA: Diagnosis present

## 2021-01-11 DIAGNOSIS — F1010 Alcohol abuse, uncomplicated: Secondary | ICD-10-CM | POA: Diagnosis present

## 2021-01-11 DIAGNOSIS — G9341 Metabolic encephalopathy: Principal | ICD-10-CM | POA: Diagnosis present

## 2021-01-11 DIAGNOSIS — Z794 Long term (current) use of insulin: Secondary | ICD-10-CM

## 2021-01-11 DIAGNOSIS — F419 Anxiety disorder, unspecified: Secondary | ICD-10-CM | POA: Diagnosis present

## 2021-01-11 DIAGNOSIS — N179 Acute kidney failure, unspecified: Secondary | ICD-10-CM | POA: Diagnosis present

## 2021-01-11 DIAGNOSIS — F0390 Unspecified dementia without behavioral disturbance: Secondary | ICD-10-CM | POA: Diagnosis present

## 2021-01-11 DIAGNOSIS — E669 Obesity, unspecified: Secondary | ICD-10-CM | POA: Diagnosis present

## 2021-01-11 DIAGNOSIS — J302 Other seasonal allergic rhinitis: Secondary | ICD-10-CM | POA: Diagnosis present

## 2021-01-11 DIAGNOSIS — R633 Feeding difficulties, unspecified: Secondary | ICD-10-CM | POA: Diagnosis present

## 2021-01-11 DIAGNOSIS — E11649 Type 2 diabetes mellitus with hypoglycemia without coma: Secondary | ICD-10-CM | POA: Diagnosis not present

## 2021-01-11 DIAGNOSIS — J9601 Acute respiratory failure with hypoxia: Secondary | ICD-10-CM | POA: Diagnosis not present

## 2021-01-11 DIAGNOSIS — Z8249 Family history of ischemic heart disease and other diseases of the circulatory system: Secondary | ICD-10-CM

## 2021-01-11 DIAGNOSIS — Z978 Presence of other specified devices: Secondary | ICD-10-CM

## 2021-01-11 DIAGNOSIS — B9620 Unspecified Escherichia coli [E. coli] as the cause of diseases classified elsewhere: Secondary | ICD-10-CM | POA: Diagnosis present

## 2021-01-11 DIAGNOSIS — N1832 Chronic kidney disease, stage 3b: Secondary | ICD-10-CM | POA: Diagnosis present

## 2021-01-11 DIAGNOSIS — R627 Adult failure to thrive: Secondary | ICD-10-CM | POA: Diagnosis present

## 2021-01-11 DIAGNOSIS — F319 Bipolar disorder, unspecified: Secondary | ICD-10-CM | POA: Diagnosis present

## 2021-01-11 DIAGNOSIS — B961 Klebsiella pneumoniae [K. pneumoniae] as the cause of diseases classified elsewhere: Secondary | ICD-10-CM | POA: Diagnosis not present

## 2021-01-11 DIAGNOSIS — Z20822 Contact with and (suspected) exposure to covid-19: Secondary | ICD-10-CM | POA: Diagnosis present

## 2021-01-11 DIAGNOSIS — R4189 Other symptoms and signs involving cognitive functions and awareness: Secondary | ICD-10-CM | POA: Diagnosis present

## 2021-01-11 DIAGNOSIS — D72829 Elevated white blood cell count, unspecified: Secondary | ICD-10-CM

## 2021-01-11 DIAGNOSIS — R159 Full incontinence of feces: Secondary | ICD-10-CM | POA: Diagnosis present

## 2021-01-11 DIAGNOSIS — F17210 Nicotine dependence, cigarettes, uncomplicated: Secondary | ICD-10-CM | POA: Diagnosis present

## 2021-01-11 DIAGNOSIS — E1165 Type 2 diabetes mellitus with hyperglycemia: Secondary | ICD-10-CM

## 2021-01-11 DIAGNOSIS — R0789 Other chest pain: Secondary | ICD-10-CM | POA: Diagnosis not present

## 2021-01-11 DIAGNOSIS — R2990 Unspecified symptoms and signs involving the nervous system: Secondary | ICD-10-CM

## 2021-01-11 DIAGNOSIS — R400 Somnolence: Secondary | ICD-10-CM

## 2021-01-11 DIAGNOSIS — J690 Pneumonitis due to inhalation of food and vomit: Secondary | ICD-10-CM | POA: Diagnosis not present

## 2021-01-11 DIAGNOSIS — Z7401 Bed confinement status: Secondary | ICD-10-CM

## 2021-01-11 DIAGNOSIS — Z5329 Procedure and treatment not carried out because of patient's decision for other reasons: Secondary | ICD-10-CM | POA: Diagnosis not present

## 2021-01-11 DIAGNOSIS — Z7151 Drug abuse counseling and surveillance of drug abuser: Secondary | ICD-10-CM

## 2021-01-11 DIAGNOSIS — J3089 Other allergic rhinitis: Secondary | ICD-10-CM

## 2021-01-11 DIAGNOSIS — Z881 Allergy status to other antibiotic agents status: Secondary | ICD-10-CM

## 2021-01-11 DIAGNOSIS — R739 Hyperglycemia, unspecified: Secondary | ICD-10-CM

## 2021-01-11 DIAGNOSIS — R451 Restlessness and agitation: Secondary | ICD-10-CM | POA: Diagnosis not present

## 2021-01-11 DIAGNOSIS — E43 Unspecified severe protein-calorie malnutrition: Secondary | ICD-10-CM | POA: Insufficient documentation

## 2021-01-11 HISTORY — DX: COVID-19: U07.1

## 2021-01-11 LAB — COMPREHENSIVE METABOLIC PANEL
ALT: 8 U/L (ref 0–44)
AST: 11 U/L — ABNORMAL LOW (ref 15–41)
Albumin: 3 g/dL — ABNORMAL LOW (ref 3.5–5.0)
Alkaline Phosphatase: 85 U/L (ref 38–126)
Anion gap: 12 (ref 5–15)
BUN: 21 mg/dL — ABNORMAL HIGH (ref 6–20)
CO2: 27 mmol/L (ref 22–32)
Calcium: 8.7 mg/dL — ABNORMAL LOW (ref 8.9–10.3)
Chloride: 88 mmol/L — ABNORMAL LOW (ref 98–111)
Creatinine, Ser: 1.59 mg/dL — ABNORMAL HIGH (ref 0.44–1.00)
GFR, Estimated: 39 mL/min — ABNORMAL LOW (ref >60)
Glucose, Bld: 729 mg/dL (ref 70–99)
Potassium: 4.7 mmol/L (ref 3.5–5.1)
Sodium: 127 mmol/L — ABNORMAL LOW (ref 135–145)
Total Bilirubin: 0.9 mg/dL (ref 0.3–1.2)
Total Protein: 6.6 g/dL (ref 6.5–8.1)

## 2021-01-11 LAB — I-STAT VENOUS BLOOD GAS, ED
Acid-Base Excess: 3 mmol/L — ABNORMAL HIGH (ref 0.0–2.0)
Bicarbonate: 30.5 mmol/L — ABNORMAL HIGH (ref 20.0–28.0)
Calcium, Ion: 1.13 mmol/L — ABNORMAL LOW (ref 1.15–1.40)
HCT: 43 % (ref 36.0–46.0)
Hemoglobin: 14.6 g/dL (ref 12.0–15.0)
O2 Saturation: 54 %
Potassium: 4.7 mmol/L (ref 3.5–5.1)
Sodium: 128 mmol/L — ABNORMAL LOW (ref 135–145)
TCO2: 32 mmol/L (ref 22–32)
pCO2, Ven: 55.5 mmHg (ref 44.0–60.0)
pH, Ven: 7.349 (ref 7.250–7.430)
pO2, Ven: 30 mmHg — CL (ref 32.0–45.0)

## 2021-01-11 LAB — CBG MONITORING, ED
Glucose-Capillary: >600 mg/dL (ref 70–99)
Glucose-Capillary: 552 mg/dL (ref 70–99)
Glucose-Capillary: 443 mg/dL — ABNORMAL HIGH (ref 70–99)
Glucose-Capillary: 441 mg/dL — ABNORMAL HIGH (ref 70–99)
Glucose-Capillary: 377 mg/dL — ABNORMAL HIGH (ref 70–99)
Glucose-Capillary: 239 mg/dL — ABNORMAL HIGH (ref 70–99)

## 2021-01-11 LAB — CBC WITH DIFFERENTIAL/PLATELET
Abs Immature Granulocytes: 0.05 10*3/uL (ref 0.00–0.07)
Basophils Absolute: 0 10*3/uL (ref 0.0–0.1)
Basophils Relative: 0 %
Eosinophils Absolute: 0 10*3/uL (ref 0.0–0.5)
Eosinophils Relative: 0 %
HCT: 41.6 % (ref 36.0–46.0)
Hemoglobin: 13.5 g/dL (ref 12.0–15.0)
Immature Granulocytes: 0 %
Lymphocytes Relative: 12 %
Lymphs Abs: 1.6 10*3/uL (ref 0.7–4.0)
MCH: 28.5 pg (ref 26.0–34.0)
MCHC: 32.5 g/dL (ref 30.0–36.0)
MCV: 87.9 fL (ref 80.0–100.0)
Monocytes Absolute: 1 10*3/uL (ref 0.1–1.0)
Monocytes Relative: 7 %
Neutro Abs: 11.3 10*3/uL — ABNORMAL HIGH (ref 1.7–7.7)
Neutrophils Relative %: 81 %
Platelets: 175 10*3/uL (ref 150–400)
RBC: 4.73 MIL/uL (ref 3.87–5.11)
RDW: 13.5 % (ref 11.5–15.5)
WBC: 14.1 10*3/uL — ABNORMAL HIGH (ref 4.0–10.5)
nRBC: 0 % (ref 0.0–0.2)

## 2021-01-11 LAB — RAPID URINE DRUG SCREEN, HOSP PERFORMED
Amphetamines: NOT DETECTED
Barbiturates: NOT DETECTED
Benzodiazepines: NOT DETECTED
Cocaine: NOT DETECTED
Opiates: NOT DETECTED
Tetrahydrocannabinol: NOT DETECTED

## 2021-01-11 LAB — URINALYSIS, ROUTINE W REFLEX MICROSCOPIC
Bilirubin Urine: NEGATIVE
Glucose, UA: >=500 mg/dL — AB
Hgb urine dipstick: NEGATIVE
Ketones, ur: 20 mg/dL — AB
Leukocytes,Ua: NEGATIVE
Nitrite: NEGATIVE
Protein, ur: NEGATIVE mg/dL
Specific Gravity, Urine: 1.022 (ref 1.005–1.030)
pH: 5 (ref 5.0–8.0)

## 2021-01-11 LAB — RESP PANEL BY RT-PCR (FLU A&B, COVID) ARPGX2
Influenza A by PCR: NEGATIVE
Influenza B by PCR: NEGATIVE
SARS Coronavirus 2 by RT PCR: NEGATIVE

## 2021-01-11 LAB — CK: Total CK: 74 U/L (ref 38–234)

## 2021-01-11 MED ORDER — SODIUM CHLORIDE 0.9 % IV BOLUS
3000.0000 mL | Freq: Once | INTRAVENOUS | Status: AC
  Administered 2021-01-11: 3000 mL via INTRAVENOUS

## 2021-01-11 MED ORDER — DEXTROSE IN LACTATED RINGERS 5 % IV SOLN: INTRAVENOUS | Status: DC

## 2021-01-11 MED ORDER — LACTATED RINGERS IV SOLN: INTRAVENOUS | Status: DC

## 2021-01-11 MED ORDER — INSULIN REGULAR(HUMAN) IN NACL 100-0.9 UT/100ML-% IV SOLN
INTRAVENOUS | Status: DC
  Administered 2021-01-11: 13 [IU]/h via INTRAVENOUS
  Filled 2021-01-11: qty 100

## 2021-01-11 MED ORDER — DEXTROSE 50 % IV SOLN
0.0000 mL | INTRAVENOUS | Status: DC | PRN
  Administered 2021-01-20 – 2021-04-28 (×6): 50 mL via INTRAVENOUS
  Filled 2021-01-11 (×5): qty 50

## 2021-01-11 MED ORDER — INSULIN ASPART 100 UNIT/ML IJ SOLN
0.0000 [IU] | Freq: Three times a day (TID) | INTRAMUSCULAR | Status: DC
  Administered 2021-01-12: 2 [IU] via SUBCUTANEOUS
  Administered 2021-01-12: 3 [IU] via SUBCUTANEOUS
  Administered 2021-01-12: 2 [IU] via SUBCUTANEOUS

## 2021-01-11 MED ORDER — INSULIN ASPART 100 UNIT/ML IJ SOLN
0.0000 [IU] | Freq: Every day | INTRAMUSCULAR | Status: DC
Start: 2021-01-11 — End: 2021-01-12
  Administered 2021-01-12: 2 [IU] via SUBCUTANEOUS

## 2021-01-11 NOTE — ED Notes (Signed)
Provided pt w/ water and crackers  

## 2021-01-11 NOTE — ED Notes (Signed)
PA attempted to ambulate pt and was unsuccessful. Pt unable to ambulate.

## 2021-01-11 NOTE — ED Provider Notes (Signed)
[facility] EMERGENCY DEPARTMENT Provider Note   CSN: [id number] Arrival date & time: 01/11/21  1132     History Chief Complaint  Patient presents with  . Generalized Weakness  . Multiple Falls  . Medication Noncompliance  . Altered Mental Status  . Hyperglycemia    Carolyn Norton is a 55 y.o. female.  HPI Patient presents for evaluation of confusion, not taking medicine and altered mental status.  She is unable to give any history.  She apparently lives at home with her parents.  Level 5 caveat-altered mental status    Past Medical History:  Diagnosis Date  . Anxiety   . Asthma   . COVID    History of  . Depression   . Diabetes mellitus without complication ([place])   . Hyperlipidemia   . Hypertension   . Seasonal allergies     Patient Active Problem List   Diagnosis Date Noted  . Acute metabolic encephalopathy 26/33/3545  . Hypotension 10/22/2020  . Leukocytosis 10/22/2020  . Type 2 diabetes mellitus ([place]) 10/22/2020  . Altered mental status   . Hyperosmolar hyperglycemic state (HHS) ([place])   . Hyperglycemia   . Hyperkalemia   . AKI (acute kidney injury) ([place])   . Essential hypertension   . DKA (diabetic ketoacidosis) ([place]) 09/06/2020  . COVID-19 09/06/2020  . Bipolar 1 disorder ([place]) 09/06/2020  . Breast mass status post excision 09/06/2020  . Smoker 09/06/2020  . Metabolic acidosis due to ingestion of drugs or chemicals 09/06/2020    Past Surgical History:  Procedure Laterality Date  . BREAST EXCISIONAL BIOPSY Right   . BREAST SURGERY     right breast     OB History    Gravida  0   Para  0   Term  0   Preterm  0   AB  0   Living  0     SAB  0   IAB  0   Ectopic  0   Multiple  0   Live Births  0           Family History  Problem Relation Age of Onset  . Hypertension Mother   . Cancer Mother   . Kidney cancer Mother   . Cancer Father        prostate  . Breast cancer Paternal Grandmother   .  Leukemia Brother     Social History   Tobacco Use  . Smoking status: Current Every Day Smoker    Packs/day: 0.50    Types: Cigarettes  . Smokeless tobacco: Never Used  Vaping Use  . Vaping Use: Never used  Substance Use Topics  . Alcohol use: No  . Drug use: Yes    Types: Cocaine    Home Medications Prior to Admission medications   Medication Sig Start Date End Date Taking? Authorizing Provider  ADVAIR HFA 230-21 MCG/ACT inhaler Inhale 2 puffs into the lungs 2 (two) times daily. puff 09/11/20   [provider]  albuterol (PROVENTIL HFA;VENTOLIN HFA) 108 (90 Base) MCG/ACT inhaler Inhale 2 puffs into the lungs every 4 (four) hours as needed for wheezing or shortness of breath. 10/21/16   Muthersbaugh, Jarrett Soho, PA-C  albuterol (PROVENTIL) (2.5 MG/3ML) 0.083% nebulizer solution TAKE 3 MLS (2.5 MG TOTAL) BY NEBULIZATION EVERY 6 (SIX) HOURS AS NEEDED FOR WHEEZING OR SHORTNESS OF BREATH. 09/13/20 09/13/21  [name], NP  atorvastatin (LIPITOR) 20 MG tablet Take 1 tablet (20 mg total) by mouth daily. 07/25/20  [name], NP  Blood Glucose Monitoring Suppl (TRUE METRIX METER) w/Device KIT Use as instructed. Check blood glucose level by fingerstick twice per day. 07/25/20   [name], NP  Dexlansoprazole (DEXILANT) 30 MG capsule Take 30 mg by mouth 2 (two) times daily.    [provider]  escitalopram (LEXAPRO) 20 MG tablet Take 1 tablet (20 mg total) by mouth daily. 10/27/20 04/25/21  Mercy Riding, MD  gabapentin (NEURONTIN) 300 MG capsule Take 300 mg by mouth 2 (two) times daily.    [provider]  glucose blood (TRUE METRIX BLOOD GLUCOSE TEST) test strip Use as instructed. Check blood glucose level by fingerstick twice per day. 07/25/20   [name], NP  hydrOXYzine (ATARAX/VISTARIL) 25 MG tablet Take 1 tablet (25 mg total) by mouth every 8 (eight) hours as needed for anxiety. 07/25/20   [name], NP  insulin glargine (LANTUS) 100 UNIT/ML  Solostar Pen INJECT 45 UNITS INTO THE SKIN DAILY AT 10 PM. Patient taking differently: Inject 50 Units into the skin daily at 10 pm. 07/25/20 07/25/21  Charlott Rakes, MD  lisinopril (ZESTRIL) 20 MG tablet Take 1 tablet (20 mg total) by mouth daily. 10/31/20 11/30/20  Mercy Riding, MD  loratadine (CLARITIN) 10 MG tablet TAKE 1 TABLET (10 MG TOTAL) BY MOUTH DAILY. Patient not taking: No sig reported 09/13/20 09/13/21  [name], NP  montelukast (SINGULAIR) 10 MG tablet Take 1 tablet (10 mg total) by mouth at bedtime. 07/25/20 10/23/20  [name], NP  oxybutynin (DITROPAN) 5 MG tablet TAKE ONE TABLET BY MOUTH EVERY 6 TO 8 HOURS FOR URINARY FREQUENCY / URGENCY Patient taking differently: Take 5 mg by mouth See admin instructions. 90m every 12 hours And 521mevery 6 hours as needed for urinary frequency/urgency 09/25/20   McKenzie, PaCandee FurbishMD  TRUEplus Lancets 28G MISC Use as instructed. Check blood glucose level by fingerstick twice per day. 07/25/20   FlGildardo PoundsNP    Allergies    Erythromycin  Review of Systems   Review of Systems  Unable to perform ROS: Mental status change    Physical Exam Updated Vital Signs BP 109/71   Pulse 67   Temp 98.2 F (36.8 C) (Oral)   Resp 17   Ht 5' 6"  (1.676 m)   Wt 99.8 kg   LMP 01/26/2017 (Approximate)   SpO2 92%   BMI 35.51 kg/m   Physical Exam Vitals and nursing note reviewed.  Constitutional:      General: She is in acute distress.     Appearance: She is well-developed. She is ill-appearing. She is not toxic-appearing or diaphoretic.  HENT:     Head: Normocephalic and atraumatic.     Right Ear: External ear normal.     Left Ear: External ear normal.     Mouth/Throat:     Mouth: Mucous membranes are dry.     Pharynx: No oropharyngeal exudate or posterior oropharyngeal erythema.  Eyes:     Conjunctiva/sclera: Conjunctivae normal.     Pupils: Pupils are equal, round, and reactive to light.  Neck:     Trachea:  Phonation normal.  Cardiovascular:     Rate and Rhythm: Normal rate and regular rhythm.     Heart sounds: Normal heart sounds.  Pulmonary:     Effort: Pulmonary effort is normal.     Breath sounds: Normal breath sounds.  Abdominal:     Palpations: Abdomen is soft.  Tenderness: There is no abdominal tenderness.  Musculoskeletal:        General: Normal range of motion.     Cervical back: Normal range of motion and neck supple.  Skin:    General: Skin is warm and dry.  Neurological:     Mental Status: She is alert and oriented to person, place, and time.     Cranial Nerves: No cranial nerve deficit.     Sensory: No sensory deficit.     Motor: No abnormal muscle tone.     Coordination: Coordination normal.  Psychiatric:        Mood and Affect: Mood normal.        Behavior: Behavior normal.     ED Results / Procedures / Treatments   Labs (all labs ordered are listed, but only abnormal results are displayed) Labs Reviewed  COMPREHENSIVE METABOLIC PANEL - Abnormal; Notable for the following components:      Result Value   Sodium 127 (*)    Chloride 88 (*)    Glucose, Bld 729 (*)    BUN 21 (*)    Creatinine, Ser 1.59 (*)    Calcium 8.7 (*)    Albumin 3.0 (*)    AST 11 (*)    GFR, Estimated 39 (*)    All other components within normal limits  CBC WITH DIFFERENTIAL/PLATELET - Abnormal; Notable for the following components:   WBC 14.1 (*)    Neutro Abs 11.3 (*)    All other components within normal limits  URINALYSIS, ROUTINE W REFLEX MICROSCOPIC - Abnormal; Notable for the following components:   Color, Urine STRAW (*)    Glucose, UA >=500 (*)    Ketones, ur 20 (*)    Bacteria, UA RARE (*)    All other components within normal limits  CBG MONITORING, ED - Abnormal; Notable for the following components:   Glucose-Capillary >600 (*)    All other components within normal limits  I-STAT VENOUS BLOOD GAS, ED - Abnormal; Notable for the following components:   pO2, Ven  30.0 (*)    Bicarbonate 30.5 (*)    Acid-Base Excess 3.0 (*)    Sodium 128 (*)    Calcium, Ion 1.13 (*)    All other components within normal limits  CBG MONITORING, ED - Abnormal; Notable for the following components:   Glucose-Capillary 552 (*)    All other components within normal limits  CBG MONITORING, ED - Abnormal; Notable for the following components:   Glucose-Capillary 443 (*)    All other components within normal limits  CBG MONITORING, ED - Abnormal; Notable for the following components:   Glucose-Capillary 441 (*)    All other components within normal limits  CBG MONITORING, ED - Abnormal; Notable for the following components:   Glucose-Capillary 377 (*)    All other components within normal limits  CBG MONITORING, ED - Abnormal; Notable for the following components:   Glucose-Capillary 239 (*)    All other components within normal limits  RESP PANEL BY RT-PCR (FLU A&B, COVID) ARPGX2  BLOOD GAS, VENOUS  RAPID URINE DRUG SCREEN, HOSP PERFORMED    EKG None  Radiology No results found.  Procedures .Critical Care Performed by: Daleen Bo, MD Authorized by: Daleen Bo, MD   Critical care provider statement:    Critical care time (minutes):  55   Critical care start time:  01/11/2021 11:44 AM   Critical care end time:  01/11/2021 9:00 PM   Critical care time was  exclusive of:  Separately billable procedures and treating other patients   Critical care was time spent personally by me on the following activities:  Blood draw for specimens, development of treatment plan with patient or surrogate, discussions with consultants, evaluation of patient's response to treatment, examination of patient, obtaining history from patient or surrogate, ordering and performing treatments and interventions, ordering and review of laboratory studies, pulse oximetry, re-evaluation of patient's condition, review of old charts and ordering and review of radiographic studies      Medications Ordered in ED Medications  lactated ringers infusion ( Intravenous Stopped 01/11/21 1850)  dextrose 5 % in lactated ringers infusion ( Intravenous Stopped 01/11/21 1952)  dextrose 50 % solution 0-50 mL (has no administration in time range)  insulin regular, human (MYXREDLIN) 100 units/ 100 mL infusion ( Intravenous Stopped 01/11/21 1952)  sodium chloride 0.9 % bolus 3,000 mL (0 mLs Intravenous Stopped 01/11/21 1726)    ED Course  I have reviewed the triage vital signs and the nursing notes.  Pertinent labs & imaging results that were available during my care of the patient were reviewed by me and considered in my medical decision making (see chart for details).  Clinical Course as of 01/11/21 2043  Sat Jan 11, 2021  1900 CBG improved with IV insulin. [EW]  1952 She is resting comfortably and is alert and cooperative.  Vital signs are normal. [EW]  2008 To ready patient for discharge, I talked to the patient's father with whom she lives.  He states that he helps her with her medications.  He is concerned that she is not taking her medications.  She sometimes puts all of her medicines in a bag and reaches and takes some pills, and not others.  He has put pills in a pill organizer and she sometimes does not take them for days and then will take them all at once.  He thinks that she might be taking too little or too many medications.  He states that she has used cocaine in the past but is not using it now.  He is concerned that she has been confused for 4 months since she had COVID.  He states that yesterday she fell 4 times, last time, she slept on the floor all night.  This morning EMS came and picked her up and put her in her bed.  Later they called to bring her here for evaluation. [EW]  2013 Urine rapid drug screen (hosp performed) [EW]    Clinical Course User Index [EW] Daleen Bo, MD   MDM Rules/Calculators/A&P                           Patient Vitals for the past 24 hrs:   BP Temp Temp src Pulse Resp SpO2 Height Weight  01/11/21 1945 -- -- -- 67 17 92 % -- --  01/11/21 1930 109/71 -- -- 76 16 94 % -- --  01/11/21 1915 (!) 103/55 -- -- 70 15 93 % -- --  01/11/21 1900 119/62 -- -- 72 16 94 % -- --  01/11/21 1845 108/62 -- -- 75 17 93 % -- --  01/11/21 1830 105/62 -- -- 73 18 94 % -- --  01/11/21 1745 109/77 -- -- 78 15 96 % -- --  01/11/21 1730 127/69 -- -- 74 11 97 % -- --  01/11/21 1700 126/61 -- -- 78 16 97 % -- --  01/11/21 1645 120/65 -- --  80 17 95 % -- --  01/11/21 1630 118/69 -- -- 80 14 99 % -- --  01/11/21 1615 124/76 -- -- 86 18 95 % -- --  01/11/21 1610 129/69 -- -- 87 18 97 % -- --  01/11/21 1515 134/76 -- -- 77 15 96 % -- --  01/11/21 1445 (!) 156/86 -- -- 73 13 98 % -- --  01/11/21 1430 (!) 142/78 -- -- 74 15 94 % -- --  01/11/21 1415 137/81 -- -- 72 13 94 % -- --  01/11/21 1400 (!) 142/75 -- -- 72 14 96 % -- --  01/11/21 1345 138/76 -- -- 73 13 94 % -- --  01/11/21 1330 (!) 143/77 -- -- 75 10 94 % -- --  01/11/21 1315 (!) 147/95 -- -- 74 16 100 % -- --  01/11/21 1300 (!) 146/97 -- -- 78 14 95 % -- --  01/11/21 1245 137/72 -- -- 76 16 95 % -- --  01/11/21 1230 136/83 -- -- 76 14 93 % -- --  01/11/21 1215 (!) 146/77 -- -- 74 14 92 % -- --  01/11/21 1200 126/67 -- -- 79 (!) 26 92 % -- --  01/11/21 1152 -- -- -- -- -- -- 5' 6"  (1.676 m) 99.8 kg  01/11/21 1151 131/66 98.2 F (36.8 C) Oral 77 13 96 % -- --  01/11/21 1144 -- -- -- -- -- 99 % -- --    8:43 PM Reevaluation with update and discussion. After initial assessment and treatment, an updated evaluation reveals she is alert, still mildly confused, CBG improved. Daleen Bo   Medical Decision Making:  This patient is presenting for evaluation of confusion, medication, and hyperglycemia, which does require a range of treatment options, and is a complaint that involves a high risk of morbidity and mortality. The differential diagnoses include hyperglycemia, DKA, metabolic disorder, CNS  disorder. I decided to review old records, and in summary middle-aged female, living with parents, presenting for hyperglycemia without taking usual medications.  I received additional historical information from father by telephone.  Clinical Laboratory Tests Ordered, included CBC, Metabolic panel, Urinalysis and ABG, viral panel. Review indicates normal except sodium low, glucose high, chloride low, BUN high, creatinine high, calcium low, albumin low, white count high.  Critical Interventions-clinical evaluation, laboratory testing, IV fluids, IV insulin, observation and reevaluation  After These Interventions, the Patient was reevaluated and was found with hyperglycemia which improved after treatment with IV fluids and IV insulin.  Patient remained somewhat confused and unable to give complete history as given by father with whom she lives.  She is apparently noncompliant with medications and presumably diet.  She has been known to use cocaine.  She has had confusion, worsening over the last 4 months since she had a COVID infection according to her father.  He has had frequent falls in the last several days without obvious signs of serious injury.  Patient is having difficulty controlling her care and treatments at home.  CRITICAL CARE- yes Performed by: Daleen Bo  Nursing Notes Reviewed/ Care Coordinated Applicable Imaging Reviewed Interpretation of Laboratory Data incorporated into ED treatment  Disposition as per PA following completion of treatment and consideration for discharge versus admission.   Final Clinical Impression(s) / ED Diagnoses Final diagnoses:  Hyperglycemia  Noncompliance with medication regimen  Frequent falls    Rx / DC Orders ED Discharge Orders    None       Daleen Bo, MD 01/14/21 1246

## 2021-01-11 NOTE — ED Provider Notes (Addendum)
Care assumed from Dr. Effie ShyWentz at shift change pending UDS and ambulation trial.  See his note for full H&P. Briefly this is a 55 yo female with pmh of polysubstance abuse, type 2 diabetes, bipolar 1 disorder, hypertension, covid presenting with confusion and reportedly not taking her home medications. No family present during ED stay although father called at time of discharge with additional information about multiple falls yesterday and concern for noncompliance with home medications.   Work up by previous provider shows hyperglycemia without dka. Glucose corrected  Endo tool. Patient received insulin and 3 L NS. UA without infection.  Plan is to eat and have her ambulate. If she is ambulate can be discharged home. If not will need admission. She has admission for delirium in the past.   Physical Exam  BP 109/71   Pulse 67   Temp 98.2 F (36.8 C) (Oral)   Resp 17   Ht 5\' 6"  (1.676 m)   Wt 99.8 kg   LMP 01/26/2017 (Approximate)   SpO2 92%   BMI 35.51 kg/m   Physical Exam  PE: Constitutional: Disheveled, no apparent distress HENT: normocephalic, atraumatic. No palpable skull deformities Cardiovascular: normal rate and rhythm, distal pulses intact Pulmonary/Chest: effort normal; breath sounds clear and equal bilaterally; no wheezes or rales Abdominal: soft and nontender Musculoskeletal: full ROM, no edema. Compartments are soft in all extremities. Neurological: alert to self only. Skin: warm and dry, no rash, no diaphoresis Psychiatric: flat affect.   ED Course/Procedures   Clinical Course as of 01/11/21 2350  Sat Jan 11, 2021  1900 CBG improved with IV insulin. [EW]  1952 She is resting comfortably and is alert and cooperative.  Vital signs are normal. [EW]  2008 To ready patient for discharge, I talked to the patient's father with whom she lives.  He states that he helps her with her medications.  He is concerned that she is not taking her medications.  She sometimes puts all of  her medicines in a bag and reaches and takes some pills, and not others.  He has put pills in a pill organizer and she sometimes does not take them for days and then will take them all at once.  He thinks that she might be taking too little or too many medications.  He states that she has used cocaine in the past but is not using it now.  He is concerned that she has been confused for 4 months since she had COVID.  He states that yesterday she fell 4 times, last time, she slept on the floor all night.  This morning EMS came and picked her up and put her in her bed.  Later they called to bring her here for evaluation. [EW]  2013 Urine rapid drug screen (hosp performed) [EW]    Clinical Course User Index [EW] Mancel BaleWentz, Ediberto Sens, MD    Results for orders placed or performed during the hospital encounter of 01/11/21  Resp Panel by RT-PCR (Flu A&B, Covid) Nasopharyngeal Swab   Specimen: Nasopharyngeal Swab; Nasopharyngeal(NP) swabs in vial transport medium  Result Value Ref Range   SARS Coronavirus 2 by RT PCR NEGATIVE NEGATIVE   Influenza A by PCR NEGATIVE NEGATIVE   Influenza B by PCR NEGATIVE NEGATIVE  Comprehensive metabolic panel  Result Value Ref Range   Sodium 127 (L) 135 - 145 mmol/L   Potassium 4.7 3.5 - 5.1 mmol/L   Chloride 88 (L) 98 - 111 mmol/L   CO2 27 22 - 32 mmol/L  Glucose, Bld 729 (HH) 70 - 99 mg/dL   BUN 21 (H) 6 - 20 mg/dL   Creatinine, Ser 7.67 (H) 0.44 - 1.00 mg/dL   Calcium 8.7 (L) 8.9 - 10.3 mg/dL   Total Protein 6.6 6.5 - 8.1 g/dL   Albumin 3.0 (L) 3.5 - 5.0 g/dL   AST 11 (L) 15 - 41 U/L   ALT 8 0 - 44 U/L   Alkaline Phosphatase 85 38 - 126 U/L   Total Bilirubin 0.9 0.3 - 1.2 mg/dL   GFR, Estimated 39 (L) >60 mL/min   Anion gap 12 5 - 15  CBC with Differential  Result Value Ref Range   WBC 14.1 (H) 4.0 - 10.5 K/uL   RBC 4.73 3.87 - 5.11 MIL/uL   Hemoglobin 13.5 12.0 - 15.0 g/dL   HCT 20.9 47.0 - 96.2 %   MCV 87.9 80.0 - 100.0 fL   MCH 28.5 26.0 - 34.0 pg    MCHC 32.5 30.0 - 36.0 g/dL   RDW 83.6 62.9 - 47.6 %   Platelets 175 150 - 400 K/uL   nRBC 0.0 0.0 - 0.2 %   Neutrophils Relative % 81 %   Neutro Abs 11.3 (H) 1.7 - 7.7 K/uL   Lymphocytes Relative 12 %   Lymphs Abs 1.6 0.7 - 4.0 K/uL   Monocytes Relative 7 %   Monocytes Absolute 1.0 0.1 - 1.0 K/uL   Eosinophils Relative 0 %   Eosinophils Absolute 0.0 0.0 - 0.5 K/uL   Basophils Relative 0 %   Basophils Absolute 0.0 0.0 - 0.1 K/uL   Immature Granulocytes 0 %   Abs Immature Granulocytes 0.05 0.00 - 0.07 K/uL  Urinalysis, Routine w reflex microscopic Urine, Clean Catch  Result Value Ref Range   Color, Urine STRAW (A) YELLOW   APPearance CLEAR CLEAR   Specific Gravity, Urine 1.022 1.005 - 1.030   pH 5.0 5.0 - 8.0   Glucose, UA >=500 (A) NEGATIVE mg/dL   Hgb urine dipstick NEGATIVE NEGATIVE   Bilirubin Urine NEGATIVE NEGATIVE   Ketones, ur 20 (A) NEGATIVE mg/dL   Protein, ur NEGATIVE NEGATIVE mg/dL   Nitrite NEGATIVE NEGATIVE   Leukocytes,Ua NEGATIVE NEGATIVE   RBC / HPF 0-5 0 - 5 RBC/hpf   WBC, UA 0-5 0 - 5 WBC/hpf   Bacteria, UA RARE (A) NONE SEEN   Squamous Epithelial / LPF 0-5 0 - 5   Mucus PRESENT   Urine rapid drug screen (hosp performed)  Result Value Ref Range   Opiates NONE DETECTED NONE DETECTED   Cocaine NONE DETECTED NONE DETECTED   Benzodiazepines NONE DETECTED NONE DETECTED   Amphetamines NONE DETECTED NONE DETECTED   Tetrahydrocannabinol NONE DETECTED NONE DETECTED   Barbiturates NONE DETECTED NONE DETECTED  CK  Result Value Ref Range   Total CK 74 38 - 234 U/L  CBG monitoring, ED  Result Value Ref Range   Glucose-Capillary >600 (HH) 70 - 99 mg/dL  I-Stat venous blood gas, ED  Result Value Ref Range   pH, Ven 7.349 7.250 - 7.430   pCO2, Ven 55.5 44.0 - 60.0 mmHg   pO2, Ven 30.0 (LL) 32.0 - 45.0 mmHg   Bicarbonate 30.5 (H) 20.0 - 28.0 mmol/L   TCO2 32 22 - 32 mmol/L   O2 Saturation 54.0 %   Acid-Base Excess 3.0 (H) 0.0 - 2.0 mmol/L   Sodium 128 (L) 135  - 145 mmol/L   Potassium 4.7 3.5 - 5.1 mmol/L   Calcium, Ion  1.13 (L) 1.15 - 1.40 mmol/L   HCT 43.0 36.0 - 46.0 %   Hemoglobin 14.6 12.0 - 15.0 g/dL   Sample type VENOUS    Comment NOTIFIED PHYSICIAN   CBG monitoring, ED  Result Value Ref Range   Glucose-Capillary 552 (HH) 70 - 99 mg/dL   Comment 1 Notify RN   CBG monitoring, ED  Result Value Ref Range   Glucose-Capillary 443 (H) 70 - 99 mg/dL  CBG monitoring, ED  Result Value Ref Range   Glucose-Capillary 441 (H) 70 - 99 mg/dL  CBG monitoring, ED  Result Value Ref Range   Glucose-Capillary 377 (H) 70 - 99 mg/dL  CBG monitoring, ED  Result Value Ref Range   Glucose-Capillary 239 (H) 70 - 99 mg/dL   CT Head Wo Contrast  Result Date: 01/11/2021 CLINICAL DATA:  Mental status change.  Cause unknown. EXAM: CT HEAD WITHOUT CONTRAST TECHNIQUE: Contiguous axial images were obtained from the base of the skull through the vertex without intravenous contrast. COMPARISON:  CT head 10/22/2020 FINDINGS: Brain: Similar-appearing prominence of the lateral ventricles may be related to central predominant atrophy, although a component of normal pressure/communicating hydrocephalus cannot be excluded. No evidence of large-territorial acute infarction. No parenchymal hemorrhage. No mass lesion. No extra-axial collection. Patchy and confluent areas of decreased attenuation are noted throughout the deep and periventricular white matter of the cerebral hemispheres bilaterally, compatible with chronic microvascular ischemic disease. Hypodensity along the right pulse cerebral unchanged. No mass effect or midline shift. No hydrocephalus. Basilar cisterns are patent. Vascular: No hyperdense vessel. Atherosclerotic calcifications are present within the cavernous internal carotid arteries. Skull: No acute fracture or focal lesion. Sinuses/Orbits: Paranasal sinuses and mastoid air cells are clear. The orbits are unremarkable. Other: None. IMPRESSION: 1. No acute  intracranial abnormality. 2. Similar-appearing prominence of the lateral ventricles may be related to central predominant atrophy, although a component of normal pressure/communicating hydrocephalus cannot be excluded. Electronically Signed   By: Tish Frederickson M.D.   On: 01/11/2021 23:46   DG CHEST PORT 1 VIEW  Result Date: 01/11/2021 CLINICAL DATA:  Weakness EXAM: PORTABLE CHEST 1 VIEW COMPARISON:  10/22/2020 FINDINGS: Cardiac shadow is enlarged but accentuated by the portable technique. Aortic calcifications are noted. The lungs are clear. No bony abnormality is noted. IMPRESSION: No acute abnormality noted. Electronically Signed   By: Alcide Clever M.D.   On: 01/11/2021 23:42     MDM  Patient received in sign out. Please see previous provider note to include MDM up to this point.   UDS unremarkable. Attempted to ambulate patient and she is unable to walk. She required maximum assistance to even stand up. Given she has had multiple falls and cannot ambulate patient is unsafe for discharge. Patient thinks the year is 2001. Confusion is not new per father. CK added to work up since report of her sleeping on the floor overnight yesterday, although she is currently receiving fluids which would be treatment for rhado if CK is elevated. Head CT obtained and is negative for acute findings. Chest xray also negative. I viewed images and agree with radiologist impression. Unassigned admission. Spoke with Dr. Margo Aye with hospitalist service who agrees to assume care of patient and bring into the hospital for further evaluation and management.      Portions of this note were generated with Scientist, clinical (histocompatibility and immunogenetics). Dictation errors may occur despite best attempts at proofreading.     Shanon Ace, PA-C 01/11/21 2351    Mancel Bale, MD  01/12/21 1907    Mancel Bale, MD 01/14/21 1246

## 2021-01-11 NOTE — ED Notes (Signed)
Multiple attempts to start 2nd line. IV team order being placed

## 2021-01-11 NOTE — ED Notes (Signed)
Called lab to have CK added onto previous collection

## 2021-01-11 NOTE — ED Triage Notes (Signed)
Pt arrived to ED via EMS from home w/ multiple complaints: Generalized weakness x 3 weeks w/ multiple falls, hyperglycemia, AMS, medication noncompliance x 3 days. CBG w/ EMS was 502. EMS placed 20g R AC and gave NS. EMS reports pt is A&Ox1 (self). EMS reports pt fell this morning falling onto her knees and then falling prone per family. No noted injuries per EMS and no tenderness per EMS. VSS w/ EMS.

## 2021-01-11 NOTE — ED Notes (Signed)
DC insulin, fluids and endotool per Effie Shy MD

## 2021-01-11 NOTE — H&P (Addendum)
History and Physical  Carolyn Norton [facility]:[id number] DOB: 1966-05-06 DOA: 01/11/2021  Referring physician: Asencion Noble  PCP: Elbert Ewings, [place]  Outpatient Specialists: Psychiatry Patient coming from: Home  Chief Complaint: Generalized weakness and confusion.  HPI: Carolyn Norton is a 55 y.o. female with medical history significant for DM2, bipolar disorder type 1, previous covid 19 infection, polysubstance abuse including cocaine, who presented from home where she lives with her parents due to generalized weakness, multiple falls and confusion.  Has not been compliant with her home medications.  Hx is obtained from EDP and from review of medical records.  The patient is alert and oriented x2.  She is not able to provide a reliable history due to confusion.  Called her mother x3 no answer.  Left 3 voicemail messages.  Work up in the ED revealed uncontrolled type 2 diabetes with no DKA and generalized weakness.  CBGs improved.  Due to generalized weakness and unsuccessful attempt to ambulate EDP requested admission.    ED Course:  Afebrile, BP 118/65, HR 73, RR 20, O2 sat 96% RA.  Na+ 127, glucose 729, Cr 1.59, GFR 29, serum bicarb 27, anion gap 12, WBC 14.1, neutrophil count 11.3.  Review of Systems: Review of systems as noted in the HPI. All other systems reviewed and are negative.   Past Medical History:  Diagnosis Date  . Anxiety   . Asthma   . COVID    History of  . Depression   . Diabetes mellitus without complication ([place])   . Hyperlipidemia   . Hypertension   . Seasonal allergies    Past Surgical History:  Procedure Laterality Date  . BREAST EXCISIONAL BIOPSY Right   . BREAST SURGERY     right breast    Social History:  reports that she has been smoking cigarettes. She has been smoking about 0.50 packs per day. She has never used smokeless tobacco. She reports current drug use. Drug: Cocaine. She reports that she does not drink alcohol.   Allergies   Allergen Reactions  . Erythromycin Other (See Comments)    Childhood reaction    Family History  Problem Relation Age of Onset  . Hypertension Mother   . Cancer Mother   . Kidney cancer Mother   . Cancer Father        prostate  . Breast cancer Paternal Grandmother   . Leukemia Brother       Prior to Admission medications   Medication Sig Start Date End Date Taking? Authorizing Provider  ADVAIR HFA 230-21 MCG/ACT inhaler Inhale 2 puffs into the lungs 2 (two) times daily. puff 09/11/20   [provider]  albuterol (PROVENTIL HFA;VENTOLIN HFA) 108 (90 Base) MCG/ACT inhaler Inhale 2 puffs into the lungs every 4 (four) hours as needed for wheezing or shortness of breath. 10/21/16   Muthersbaugh, Jarrett Soho, PA-C  albuterol (PROVENTIL) (2.5 MG/3ML) 0.083% nebulizer solution TAKE 3 MLS (2.5 MG TOTAL) BY NEBULIZATION EVERY 6 (SIX) HOURS AS NEEDED FOR WHEEZING OR SHORTNESS OF BREATH. 09/13/20 09/13/21  [name], NP  atorvastatin (LIPITOR) 20 MG tablet Take 1 tablet (20 mg total) by mouth daily. 07/25/20   [name], NP  Blood Glucose Monitoring Suppl (TRUE METRIX METER) w/Device KIT Use as instructed. Check blood glucose level by fingerstick twice per day. 07/25/20   [name], NP  Dexlansoprazole (DEXILANT) 30 MG capsule Take 30 mg by mouth 2 (two) times daily.    [provider]  escitalopram (LEXAPRO) 20  MG tablet Take 1 tablet (20 mg total) by mouth daily. 10/27/20 04/25/21  Mercy Riding, MD  gabapentin (NEURONTIN) 300 MG capsule Take 300 mg by mouth 2 (two) times daily.    [provider]  glucose blood (TRUE METRIX BLOOD GLUCOSE TEST) test strip Use as instructed. Check blood glucose level by fingerstick twice per day. 07/25/20   [name], NP  hydrOXYzine (ATARAX/VISTARIL) 25 MG tablet Take 1 tablet (25 mg total) by mouth every 8 (eight) hours as needed for anxiety. 07/25/20   [name], NP  insulin glargine (LANTUS) 100 UNIT/ML Solostar  Pen INJECT 45 UNITS INTO THE SKIN DAILY AT 10 PM. Patient taking differently: Inject 50 Units into the skin daily at 10 pm. 07/25/20 07/25/21  Charlott Rakes, MD  lisinopril (ZESTRIL) 20 MG tablet Take 1 tablet (20 mg total) by mouth daily. 10/31/20 11/30/20  Mercy Riding, MD  loratadine (CLARITIN) 10 MG tablet TAKE 1 TABLET (10 MG TOTAL) BY MOUTH DAILY. Patient not taking: No sig reported 09/13/20 09/13/21  [name], NP  montelukast (SINGULAIR) 10 MG tablet Take 1 tablet (10 mg total) by mouth at bedtime. 07/25/20 10/23/20  [name], NP  oxybutynin (DITROPAN) 5 MG tablet TAKE ONE TABLET BY MOUTH EVERY 6 TO 8 HOURS FOR URINARY FREQUENCY / URGENCY Patient taking differently: Take 5 mg by mouth See admin instructions. 56m every 12 hours And 52mevery 6 hours as needed for urinary frequency/urgency 09/25/20   McKenzie, PaCandee FurbishMD  TRUEplus Lancets 28G MISC Use as instructed. Check blood glucose level by fingerstick twice per day. 07/25/20   FlGildardo PoundsNP    Physical Exam: BP 118/65   Pulse 70   Temp 98.2 F (36.8 C) (Oral)   Resp 15   Ht 5' 6" (1.676 m)   Wt 99.8 kg   LMP 01/26/2017 (Approximate)   SpO2 98%   BMI 35.51 kg/m   . General: [age].o. year-old female well developed well nourished in no acute distress.  Alert and oriented x2. . Cardiovascular: Regular rate and rhythm with no rubs or gallops.  No thyromegaly or JVD noted.  No lower extremity edema. 2/4 pulses in all 4 extremities. . Marland Kitchenespiratory: Clear to auscultation with no wheezes or rales. Good inspiratory effort. . Abdomen: Soft nontender nondistended with normal bowel sounds x4 quadrants. . Muskuloskeletal: No cyanosis, clubbing or edema noted bilaterally . Neuro: CN II-XII intact, equal strength, sensation, reflexes . Skin: No ulcerative lesions noted or rashes . Psychiatry: Judgement and insight appear altered. Mood is appropriate for condition and setting          Labs on Admission:  Basic  Metabolic Panel: Recent Labs  Lab 01/11/21 1227 01/11/21 1236  NA 127* 128*  K 4.7 4.7  CL 88*  --   CO2 27  --   GLUCOSE 729*  --   BUN 21*  --   CREATININE 1.59*  --   CALCIUM 8.7*  --    Liver Function Tests: Recent Labs  Lab 01/11/21 1227  AST 11*  ALT 8  ALKPHOS 85  BILITOT 0.9  PROT 6.6  ALBUMIN 3.0*   No results for input(s): LIPASE, AMYLASE in the last 168 hours. No results for input(s): AMMONIA in the last 168 hours. CBC: Recent Labs  Lab 01/11/21 1227 01/11/21 1236  WBC 14.1*  --   NEUTROABS 11.3*  --   HGB 13.5 14.6  HCT 41.6 43.0  MCV 87.9  --  PLT 175  --    Cardiac Enzymes: No results for input(s): CKTOTAL, CKMB, CKMBINDEX, TROPONINI in the last 168 hours.  BNP (last 3 results) No results for input(s): BNP in the last 8760 hours.  ProBNP (last 3 results) No results for input(s): PROBNP in the last 8760 hours.  CBG: Recent Labs  Lab 01/11/21 1529 01/11/21 1612 01/11/21 1704 01/11/21 1737 01/11/21 1847  GLUCAP 552* 443* 441* 377* 239*    Radiological Exams on Admission: No results found.  EKG: I independently viewed the EKG done and my findings are as followed: Sinus rhythm rate of 62, nonspecific ST-T changes, QTC 424.  Assessment/Plan Present on Admission: **None**  Active Problems:   Generalized weakness  Generalized weakness, unclear etiology Last TSH normal 0.549 on 10/26/20 UA, rare bacteria. Obtain magnesium level PT OT to assess Fall precautions  Acute metabolic encephalopathy unclear etiology Unclear what her baseline is She lives at home with her parents Called her mother x3, no answer. CT head pending Fall precautions/aspiration precautions/delirium precautions. Reorient as needed  Type 2 diabetes with hyperglycemia Serum glucose 729 on presentation Improved with Endo tool History of noncompliance with her medications Last hemoglobin A1c 11.6 on 10/22/2020 Obtain hemoglobin A1c Start Lantus 10 units  nightly and insulin sliding scale  COPD, not on oxygen supplementation at baseline Resume home regimen O2 saturation 96% on room air.  Pseudohyponatremia Corrected sodium for hyperglycemia 137  Leukocytosis WBC 14 K Rule out active infective process Afebrile UA with rare bacteria Obtain chest x-ray Repeat CBC  CKD 3B She appears to be at her baseline creatinine 1.5 with GFR of 39. Continue gentle IV fluid hydration Monitor urine output Avoid nephrotoxic agents Repeat renal panel in the morning  Bipolar disorder type I Noncompliant with her medications Resume home regimen  Hyperlipidemia Resume home Lipitor  GERD Resume home PPI  History of polysubstance abuse UDS negative on 01/11/2021.     DVT prophylaxis: Subcu Lovenox daily  Code Status: Full code  Family Communication: None at bedside.  Called her mother x3 no answer.  Disposition Plan: Admit to MedSurg unit observation status.  Consults called: None.  Admission status: Observation status.   Status is: Observation    Dispo:  Patient From: Home  Planned Disposition: Home, possibly 01/13/2021 after evaluation by PT OT.  Medically stable for discharge: No      Kayleen Memos MD [facility] Pager [phone]  If 7PM-7AM, please contact night-coverage [email or web address] Password [facility]  01/11/2021, 11:33 PM

## 2021-01-12 DIAGNOSIS — D696 Thrombocytopenia, unspecified: Secondary | ICD-10-CM | POA: Diagnosis present

## 2021-01-12 DIAGNOSIS — F1423 Cocaine dependence with withdrawal: Secondary | ICD-10-CM | POA: Diagnosis not present

## 2021-01-12 DIAGNOSIS — J690 Pneumonitis due to inhalation of food and vomit: Secondary | ICD-10-CM | POA: Diagnosis not present

## 2021-01-12 DIAGNOSIS — E1122 Type 2 diabetes mellitus with diabetic chronic kidney disease: Secondary | ICD-10-CM | POA: Diagnosis present

## 2021-01-12 DIAGNOSIS — I129 Hypertensive chronic kidney disease with stage 1 through stage 4 chronic kidney disease, or unspecified chronic kidney disease: Secondary | ICD-10-CM | POA: Diagnosis present

## 2021-01-12 DIAGNOSIS — F0390 Unspecified dementia without behavioral disturbance: Secondary | ICD-10-CM | POA: Diagnosis present

## 2021-01-12 DIAGNOSIS — U099 Post covid-19 condition, unspecified: Secondary | ICD-10-CM | POA: Diagnosis present

## 2021-01-12 DIAGNOSIS — N390 Urinary tract infection, site not specified: Secondary | ICD-10-CM | POA: Diagnosis not present

## 2021-01-12 DIAGNOSIS — E43 Unspecified severe protein-calorie malnutrition: Secondary | ICD-10-CM | POA: Diagnosis present

## 2021-01-12 DIAGNOSIS — N1832 Chronic kidney disease, stage 3b: Secondary | ICD-10-CM | POA: Diagnosis present

## 2021-01-12 DIAGNOSIS — E11649 Type 2 diabetes mellitus with hypoglycemia without coma: Secondary | ICD-10-CM | POA: Diagnosis not present

## 2021-01-12 DIAGNOSIS — E1165 Type 2 diabetes mellitus with hyperglycemia: Secondary | ICD-10-CM | POA: Diagnosis present

## 2021-01-12 DIAGNOSIS — J441 Chronic obstructive pulmonary disease with (acute) exacerbation: Secondary | ICD-10-CM | POA: Diagnosis present

## 2021-01-12 DIAGNOSIS — E669 Obesity, unspecified: Secondary | ICD-10-CM | POA: Diagnosis present

## 2021-01-12 DIAGNOSIS — G3189 Other specified degenerative diseases of nervous system: Secondary | ICD-10-CM | POA: Diagnosis present

## 2021-01-12 DIAGNOSIS — G9341 Metabolic encephalopathy: Secondary | ICD-10-CM | POA: Diagnosis present

## 2021-01-12 DIAGNOSIS — R739 Hyperglycemia, unspecified: Secondary | ICD-10-CM

## 2021-01-12 DIAGNOSIS — G7281 Critical illness myopathy: Secondary | ICD-10-CM | POA: Diagnosis present

## 2021-01-12 DIAGNOSIS — N179 Acute kidney failure, unspecified: Secondary | ICD-10-CM | POA: Diagnosis present

## 2021-01-12 DIAGNOSIS — D72829 Elevated white blood cell count, unspecified: Secondary | ICD-10-CM | POA: Diagnosis present

## 2021-01-12 DIAGNOSIS — G319 Degenerative disease of nervous system, unspecified: Secondary | ICD-10-CM | POA: Diagnosis present

## 2021-01-12 DIAGNOSIS — J9601 Acute respiratory failure with hypoxia: Secondary | ICD-10-CM | POA: Diagnosis not present

## 2021-01-12 DIAGNOSIS — Z66 Do not resuscitate: Secondary | ICD-10-CM | POA: Diagnosis present

## 2021-01-12 DIAGNOSIS — Z20822 Contact with and (suspected) exposure to covid-19: Secondary | ICD-10-CM | POA: Diagnosis present

## 2021-01-12 DIAGNOSIS — F1010 Alcohol abuse, uncomplicated: Secondary | ICD-10-CM | POA: Diagnosis present

## 2021-01-12 DIAGNOSIS — F3176 Bipolar disorder, in full remission, most recent episode depressed: Secondary | ICD-10-CM | POA: Diagnosis present

## 2021-01-12 LAB — BASIC METABOLIC PANEL
Anion gap: 12 (ref 5–15)
BUN: 20 mg/dL (ref 6–20)
CO2: 22 mmol/L (ref 22–32)
Calcium: 8.1 mg/dL — ABNORMAL LOW (ref 8.9–10.3)
Chloride: 102 mmol/L (ref 98–111)
Creatinine, Ser: 1.21 mg/dL — ABNORMAL HIGH (ref 0.44–1.00)
GFR, Estimated: 54 mL/min — ABNORMAL LOW (ref >60)
Glucose, Bld: 249 mg/dL — ABNORMAL HIGH (ref 70–99)
Potassium: 5.3 mmol/L — ABNORMAL HIGH (ref 3.5–5.1)
Sodium: 136 mmol/L (ref 135–145)

## 2021-01-12 LAB — CK: Total CK: 85 U/L (ref 38–234)

## 2021-01-12 LAB — CBC
HCT: 39.1 % (ref 36.0–46.0)
Hemoglobin: 12.6 g/dL (ref 12.0–15.0)
MCH: 28.6 pg (ref 26.0–34.0)
MCHC: 32.2 g/dL (ref 30.0–36.0)
MCV: 88.9 fL (ref 80.0–100.0)
Platelets: 137 10*3/uL — ABNORMAL LOW (ref 150–400)
RBC: 4.4 MIL/uL (ref 3.87–5.11)
RDW: 13.9 % (ref 11.5–15.5)
WBC: 9.2 10*3/uL (ref 4.0–10.5)
nRBC: 0 % (ref 0.0–0.2)

## 2021-01-12 LAB — FERRITIN: Ferritin: 108 ng/mL (ref 11–307)

## 2021-01-12 LAB — GLUCOSE, CAPILLARY
Glucose-Capillary: 243 mg/dL — ABNORMAL HIGH (ref 70–99)
Glucose-Capillary: 189 mg/dL — ABNORMAL HIGH (ref 70–99)
Glucose-Capillary: 215 mg/dL — ABNORMAL HIGH (ref 70–99)
Glucose-Capillary: 191 mg/dL — ABNORMAL HIGH (ref 70–99)
Glucose-Capillary: 76 mg/dL (ref 70–99)
Glucose-Capillary: 121 mg/dL — ABNORMAL HIGH (ref 70–99)

## 2021-01-12 LAB — RETICULOCYTES
Immature Retic Fract: 9.3 % (ref 2.3–15.9)
RBC.: 4.14 MIL/uL (ref 3.87–5.11)
Retic Count, Absolute: 57.1 10*3/uL (ref 19.0–186.0)
Retic Ct Pct: 1.4 % (ref 0.4–3.1)

## 2021-01-12 LAB — MAGNESIUM: Magnesium: 1.9 mg/dL (ref 1.7–2.4)

## 2021-01-12 LAB — FOLATE: Folate: 13.7 ng/mL (ref >5.9)

## 2021-01-12 LAB — IRON AND TIBC
Iron: 57 ug/dL (ref 28–170)
Saturation Ratios: 26 % (ref 10.4–31.8)
TIBC: 220 ug/dL — ABNORMAL LOW (ref 250–450)
UIBC: 163 ug/dL

## 2021-01-12 LAB — PHOSPHORUS: Phosphorus: 3.1 mg/dL (ref 2.5–4.6)

## 2021-01-12 LAB — HEMOGLOBIN A1C
Hgb A1c MFr Bld: 11 % — ABNORMAL HIGH (ref 4.8–5.6)
Mean Plasma Glucose: 269 mg/dL

## 2021-01-12 LAB — VITAMIN B12: Vitamin B-12: 597 pg/mL (ref 180–914)

## 2021-01-12 LAB — PROCALCITONIN: Procalcitonin: 0.15 ng/mL

## 2021-01-12 MED ORDER — SODIUM POLYSTYRENE SULFONATE 15 GM/60ML PO SUSP
30.0000 g | Freq: Once | ORAL | Status: AC
  Administered 2021-01-12: 30 g via ORAL
  Filled 2021-01-12: qty 120

## 2021-01-12 MED ORDER — ACETAMINOPHEN 325 MG PO TABS
650.0000 mg | ORAL_TABLET | Freq: Four times a day (QID) | ORAL | Status: DC | PRN
  Administered 2021-01-31 – 2021-04-26 (×13): 650 mg via ORAL
  Filled 2021-01-12 (×16): qty 2

## 2021-01-12 MED ORDER — ATORVASTATIN CALCIUM 10 MG PO TABS
20.0000 mg | ORAL_TABLET | Freq: Every day | ORAL | Status: DC
  Administered 2021-01-12 – 2021-03-04 (×52): 20 mg via ORAL
  Filled 2021-01-12 (×56): qty 2

## 2021-01-12 MED ORDER — GABAPENTIN 300 MG PO CAPS
300.0000 mg | ORAL_CAPSULE | Freq: Two times a day (BID) | ORAL | Status: DC
  Administered 2021-01-12 (×2): 300 mg via ORAL
  Filled 2021-01-12 (×2): qty 1

## 2021-01-12 MED ORDER — INSULIN GLARGINE 100 UNIT/ML SOLOSTAR PEN: 25.0000 [IU] | PEN_INJECTOR | Freq: Two times a day (BID) | SUBCUTANEOUS | Status: DC

## 2021-01-12 MED ORDER — MONTELUKAST SODIUM 10 MG PO TABS
10.0000 mg | ORAL_TABLET | Freq: Every day | ORAL | Status: DC
  Administered 2021-01-12 – 2021-03-04 (×53): 10 mg via ORAL
  Filled 2021-01-12 (×54): qty 1

## 2021-01-12 MED ORDER — MELATONIN 3 MG PO TABS
3.0000 mg | ORAL_TABLET | Freq: Every evening | ORAL | Status: DC | PRN
  Administered 2021-01-14 – 2021-02-27 (×19): 3 mg via ORAL
  Filled 2021-01-12 (×22): qty 1

## 2021-01-12 MED ORDER — ENOXAPARIN SODIUM 40 MG/0.4ML IJ SOSY
40.0000 mg | PREFILLED_SYRINGE | Freq: Every day | INTRAMUSCULAR | Status: DC
  Administered 2021-01-12 – 2021-02-13 (×30): 40 mg via SUBCUTANEOUS
  Filled 2021-01-12 (×32): qty 0.4

## 2021-01-12 MED ORDER — ONDANSETRON HCL 4 MG/2ML IJ SOLN
4.0000 mg | Freq: Four times a day (QID) | INTRAMUSCULAR | Status: DC | PRN
  Administered 2021-02-03: 4 mg via INTRAVENOUS
  Filled 2021-01-12 (×2): qty 2

## 2021-01-12 MED ORDER — INSULIN GLARGINE 100 UNIT/ML ~~LOC~~ SOLN
20.0000 [IU] | Freq: Two times a day (BID) | SUBCUTANEOUS | Status: DC
  Administered 2021-01-12: 20 [IU] via SUBCUTANEOUS
  Filled 2021-01-12 (×4): qty 0.2

## 2021-01-12 MED ORDER — ASPIRIN 81 MG PO CHEW
81.0000 mg | CHEWABLE_TABLET | Freq: Every day | ORAL | Status: DC
  Administered 2021-01-12 – 2021-02-10 (×30): 81 mg via ORAL
  Filled 2021-01-12 (×33): qty 1

## 2021-01-12 MED ORDER — ALBUTEROL SULFATE (2.5 MG/3ML) 0.083% IN NEBU
3.0000 mL | INHALATION_SOLUTION | Freq: Four times a day (QID) | RESPIRATORY_TRACT | Status: DC | PRN
  Administered 2021-02-24 – 2021-02-27 (×2): 3 mL via RESPIRATORY_TRACT
  Filled 2021-01-12 (×2): qty 3

## 2021-01-12 MED ORDER — SODIUM ZIRCONIUM CYCLOSILICATE 10 G PO PACK
10.0000 g | PACK | Freq: Two times a day (BID) | ORAL | Status: AC
  Administered 2021-01-12 (×2): 10 g via ORAL
  Filled 2021-01-12 (×2): qty 1

## 2021-01-12 MED ORDER — INSULIN GLARGINE 100 UNIT/ML ~~LOC~~ SOLN
10.0000 [IU] | Freq: Every day | SUBCUTANEOUS | Status: DC
  Administered 2021-01-12: 10 [IU] via SUBCUTANEOUS
  Filled 2021-01-12 (×4): qty 0.1

## 2021-01-12 MED ORDER — PANTOPRAZOLE SODIUM 40 MG PO TBEC
40.0000 mg | DELAYED_RELEASE_TABLET | Freq: Every day | ORAL | Status: DC
  Administered 2021-01-12 – 2021-03-04 (×52): 40 mg via ORAL
  Filled 2021-01-12 (×55): qty 1

## 2021-01-12 MED ORDER — ESCITALOPRAM OXALATE 20 MG PO TABS
20.0000 mg | ORAL_TABLET | Freq: Every day | ORAL | Status: DC
  Administered 2021-01-12 – 2021-03-04 (×52): 20 mg via ORAL
  Filled 2021-01-12 (×54): qty 1

## 2021-01-12 NOTE — Progress Notes (Signed)
PROGRESS NOTE                                                                                                                                                                                                             Patient Demographics:    Carolyn Norton, is a 55 y.o. female, DOB - September 08, 1965, NOB:[id number]  Outpatient Primary MD for the patient is [name], FNP    LOS - 0  Admit date - 01/11/2021    Chief Complaint  Patient presents with  . Generalized Weakness  . Multiple Falls  . Medication Noncompliance  . Altered Mental Status  . Hyperglycemia       Brief Narrative (HPI from H&P)  -  Carolyn Norton is a 55 y.o. female with medical history significant for DM2, bipolar disorder type 1, previous covid 19 infection, polysubstance abuse including cocaine, who presented from home where she lives with her parents due to generalized weakness, multiple falls and confusion, I called her mother and was informed that patient has been in very poor health with inability to walk without falling over for at least a month, she did her last dose of crack cocaine about a month ago and ever since then she has been not doing well, poor balance multiple falls, not taking her medications, sugars running high.  In the ER she was diagnosed with severe dehydration, elevated sugars, metabolic encephalopathy and she was admitted to the hospital.   Subjective:    Carolyn Norton today in bed, she is confused, moving all 4 extremities, she denies any headache chest or abdominal pain   Assessment  & Plan :     1. Acute metabolic encephalopathy due to combination of severe dehydration, poorly controlled type 2 diabetes mellitus with hyperglycemia - she will be treated supportively with Lantus and sliding scale, hydrate with IV fluids, CT head is nonacute, she does not have any focal deficits however she has been having multiple  falls with poor balance for the last month.  Once her mentation is better will do further neurological work-up if required, PT OT.  Monitor closely.  She certainly could have a stroke and this will be placed on low-dose aspirin, continue home statin.  2.  History of polysubstance abuse including crack cocaine abuse.  Counseled to quit.  3.  History of bipolar disorder.  Currently mentation is quite often due to metabolic encephalopathy holding all possible sedating medications, and if needed will consult psych to readjust her medications  4.  COPD.  Supportive care no acute issues.  5.  AKI & Hyperkalemia on CKD 3A.  Baseline creatinine around 1.3.  AKI due to dehydration hydrate, improving will monitor.  Hyperkalemia treated.  6. GERD - PPI  7.  Dyslipidemia.  On home dose statin blood   8. DM type II with hyperglycemia.  A1c of 11.6 suggesting poor glycemic control outpatient due to hyperglycemia.  For now Lantus twice daily along with sliding scale and monitor   Lab Results  Component Value Date   HGBA1C 11.0 (H) 01/12/2021   CBG (last 3)  Recent Labs    01/12/21 0122 01/12/21 0754 01/12/21 1206  GLUCAP 243* 189* 215*        Condition -  Guarded  Family Communication  :  Mother [name] [phone] - 01/12/21  Code Status : DNR  Consults  :    PUD Prophylaxis : PPI   Procedures  :     CT Head - Non Acute      Disposition Plan  :     Dispo:  Patient From: Home  Planned Disposition: Home  Medically stable for discharge: No     DVT Prophylaxis  :    enoxaparin (LOVENOX) injection 40 mg Start: 01/12/21 1000     Lab Results  Component Value Date   PLT 137 (L) 01/12/2021    Diet :  Diet Order            Diet heart healthy/carb modified Room service appropriate? Yes; Fluid consistency: Thin  Diet effective now                  Inpatient Medications  Scheduled Meds: . atorvastatin  20 mg Oral Daily  . enoxaparin (LOVENOX) injection  40 mg  Subcutaneous Daily  . escitalopram  20 mg Oral Daily  . insulin aspart  0-9 Units Subcutaneous TID WC  . insulin glargine  20 Units Subcutaneous BID  . montelukast  10 mg Oral QHS  . pantoprazole  40 mg Oral Daily  . sodium zirconium cyclosilicate  10 g Oral BID   Continuous Infusions: . lactated ringers 75 mL/hr at 01/12/21 0127   PRN Meds:.acetaminophen, albuterol, dextrose, melatonin, ondansetron (ZOFRAN) IV  Antibiotics  :    Anti-infectives (From admission, onward)   None       Time Spent in minutes  30   Carolyn Norton M.D on 01/12/2021 at 1:44 PM  To page go to [email or web address]   [facility] -  Office  [phone]    See all Orders from today for further details    Objective:   Vitals:   01/11/21 2200 01/12/21 0012 01/12/21 0520 01/12/21 1329  BP: 118/65 135/73 131/74 (!) 143/93  Pulse: 70 67 64 71  Resp: 15 18 16 16   Temp:  97.7 F (36.5 C) 97.6 F (36.4 C) 98 F (36.7 C)  TempSrc:   Oral Oral  SpO2: 98% 96% 95% 97%  Weight:      Height:        Wt Readings from Last 3 Encounters:  01/11/21 99.8 kg  11/21/20 85.5 kg  10/22/20 85.5 kg     Intake/Output Summary (Last 24 hours) at 01/12/2021 1344 Last data filed at 01/12/2021 0400 Gross per 24 hour  Intake 3508.8 ml  Output --  Net 3508.8 ml  Physical Exam  Awake - mildly confused, No new F.N deficits  [place].AT,[name] Supple Neck,No JVD, No cervical lymphadenopathy appriciated.  Symmetrical Chest wall movement, Good air movement bilaterally, CTAB RRR,No Gallops,Rubs or new Murmurs, No Parasternal Heave +ve B.Sounds, Abd Soft, No tenderness, No organomegaly appriciated, No rebound - guarding or rigidity. No Cyanosis, Clubbing or edema, No new Rash or bruise      Data Review:    CBC Recent Labs  Lab 01/11/21 1227 01/11/21 1236 01/12/21 0232  WBC 14.1*  --  9.2  HGB 13.5 14.6 12.6  HCT 41.6 43.0 39.1  PLT 175  --  137*  MCV 87.9  --  88.9  MCH 28.5  --  28.6  MCHC 32.5  --   32.2  RDW 13.5  --  13.9  LYMPHSABS 1.6  --   --   MONOABS 1.0  --   --   EOSABS 0.0  --   --   BASOSABS 0.0  --   --     Recent Labs  Lab 01/11/21 1227 01/11/21 1236 01/12/21 0232  NA 127* 128* 136  K 4.7 4.7 5.3*  CL 88*  --  102  CO2 27  --  22  GLUCOSE 729*  --  249*  BUN 21*  --  20  CREATININE 1.59*  --  1.21*  CALCIUM 8.7*  --  8.1*  AST 11*  --   --   ALT 8  --   --   ALKPHOS 85  --   --   BILITOT 0.9  --   --   ALBUMIN 3.0*  --   --   MG  --   --  1.9  HGBA1C  --   --  11.0*    ------------------------------------------------------------------------------------------------------------------ No results for input(s): CHOL, HDL, LDLCALC, TRIG, CHOLHDL, LDLDIRECT in the last 72 hours.  Lab Results  Component Value Date   HGBA1C 11.0 (H) 01/12/2021   ------------------------------------------------------------------------------------------------------------------ No results for input(s): TSH, T4TOTAL, T3FREE, THYROIDAB in the last 72 hours.  Invalid input(s): FREET3  Cardiac Enzymes No results for input(s): CKMB, TROPONINI, MYOGLOBIN in the last 168 hours.  Invalid input(s): CK ------------------------------------------------------------------------------------------------------------------ No results found for: BNP  Micro Results Recent Results (from the past 240 hour(s))  Resp Panel by RT-PCR (Flu A&B, Covid) Nasopharyngeal Swab     Status: None   Collection Time: 01/11/21 12:35 PM   Specimen: Nasopharyngeal Swab; Nasopharyngeal(NP) swabs in vial transport medium  Result Value Ref Range Status   SARS Coronavirus 2 by RT PCR NEGATIVE NEGATIVE Final    Comment: (NOTE) SARS-CoV-2 target nucleic acids are NOT DETECTED.  The SARS-CoV-2 RNA is generally detectable in upper respiratory specimens during the acute phase of infection. The lowest concentration of SARS-CoV-2 viral copies this assay can detect is 138 copies/mL. A negative result does not  preclude SARS-Cov-2 infection and should not be used as the sole basis for treatment or other patient management decisions. A negative result may occur with  improper specimen collection/handling, submission of specimen other than nasopharyngeal swab, presence of viral mutation(s) within the areas targeted by this assay, and inadequate number of viral copies(<138 copies/mL). A negative result must be combined with clinical observations, patient history, and epidemiological information. The expected result is Negative.  Fact Sheet for Patients:  BloggerCourse.com  Fact Sheet for Healthcare Providers:  SeriousBroker.it  This test is no t yet approved or cleared by the [place] FDA and  has been authorized for detection and/or diagnosis  of SARS-CoV-2 by FDA under an Emergency Use Authorization (EUA). This EUA will remain  in effect (meaning this test can be used) for the duration of the COVID-19 declaration under Section 564(b)(1) of the Act, 21 U.S.C.section 360bbb-3(b)(1), unless the authorization is terminated  or revoked sooner.       Influenza A by PCR NEGATIVE NEGATIVE Final   Influenza B by PCR NEGATIVE NEGATIVE Final    Comment: (NOTE) The Xpert Xpress SARS-CoV-2/FLU/RSV plus assay is intended as an aid in the diagnosis of influenza from Nasopharyngeal swab specimens and should not be used as a sole basis for treatment. Nasal washings and aspirates are unacceptable for Xpert Xpress SARS-CoV-2/FLU/RSV testing.  Fact Sheet for Patients: BloggerCourse.comhttps://www.fda.gov/media/152166/download  Fact Sheet for Healthcare Providers: SeriousBroker.ithttps://www.fda.gov/media/152162/download  This test is not yet approved or cleared by the [place] FDA and has been authorized for detection and/or diagnosis of SARS-CoV-2 by FDA under an Emergency Use Authorization (EUA). This EUA will remain in effect (meaning this test can be used) for the  duration of the COVID-19 declaration under Section 564(b)(1) of the Act, 21 U.S.C. section 360bbb-3(b)(1), unless the authorization is terminated or revoked.  Performed at [facility], [address] [place], [place] [address]     Radiology Reports CT Head Wo Contrast  Result Date: 01/11/2021 CLINICAL DATA:  Mental status change.  Cause unknown. EXAM: CT HEAD WITHOUT CONTRAST TECHNIQUE: Contiguous axial images were obtained from the base of the skull through the vertex without intravenous contrast. COMPARISON:  CT head 10/22/2020 FINDINGS: Brain: Similar-appearing prominence of the lateral ventricles may be related to central predominant atrophy, although a component of normal pressure/communicating hydrocephalus cannot be excluded. No evidence of large-territorial acute infarction. No parenchymal hemorrhage. No mass lesion. No extra-axial collection. Patchy and confluent areas of decreased attenuation are noted throughout the deep and periventricular white matter of the cerebral hemispheres bilaterally, compatible with chronic microvascular ischemic disease. Hypodensity along the right pulse cerebral unchanged. No mass effect or midline shift. No hydrocephalus. Basilar cisterns are patent. Vascular: No hyperdense vessel. Atherosclerotic calcifications are present within the cavernous internal carotid arteries. Skull: No acute fracture or focal lesion. Sinuses/Orbits: Paranasal sinuses and mastoid air cells are clear. The orbits are unremarkable. Other: None. IMPRESSION: 1. No acute intracranial abnormality. 2. Similar-appearing prominence of the lateral ventricles may be related to central predominant atrophy, although a component of normal pressure/communicating hydrocephalus cannot be excluded. Electronically Signed   By: Tish FredericksonMorgane  Naveau M.D.   On: 01/11/2021 23:46   DG CHEST PORT 1 VIEW  Result Date: 01/11/2021 CLINICAL DATA:  Weakness EXAM: PORTABLE CHEST 1 VIEW COMPARISON:  10/22/2020  FINDINGS: Cardiac shadow is enlarged but accentuated by the portable technique. Aortic calcifications are noted. The lungs are clear. No bony abnormality is noted. IMPRESSION: No acute abnormality noted. Electronically Signed   By: Alcide CleverMark  Lukens M.D.   On: 01/11/2021 23:42

## 2021-01-12 NOTE — Plan of Care (Signed)
  Problem: Education: Goal: Knowledge of General Education information will improve Description Including pain rating scale, medication(s)/side effects and non-pharmacologic comfort measures Outcome: Progressing   

## 2021-01-12 NOTE — Evaluation (Signed)
Physical Therapy Evaluation Patient Details Name: Carolyn Norton MRN: [id number] DOB: [birth year] Today's Date: 01/12/2021   History of Present Illness  Pt presents from home with multiple falls, confusion, and weakness. In ED blood glucose was 729 and pt was unable to ambulate.  PMH: DM2, COPD, CKD3, GERD, bipolar disorder type 1, previous covid 19 infection, polysubstance abuse including cocaine.  Clinical Impression  Pt admitted with above diagnosis. Pt presents with weakness and poor tolerance for activity. Min A needed to come to EOB. Pt stood with mod  A for fwd wt shift. She had difficulty stepping L foot and needed facilitation of this from therapist. Could not ambulate away from bed but did side step alongside bed with mod A and RW. At current level, recommend SNF for rehab but family may want her to come home, in which case would recommend HHPT.  Pt currently with functional limitations due to the deficits listed below (see PT Problem List). Pt will benefit from skilled PT to increase their independence and safety with mobility to allow discharge to the venue listed below.   BP sitting 115/81 HR 75 bpm   Standing 130/106  HR 86 bpm     Follow Up Recommendations SNF;Supervision/Assistance - 24 hour    Equipment Recommendations  None recommended by PT    Recommendations for Other Services OT consult     Precautions / Restrictions Precautions Precautions: Fall Restrictions Weight Bearing Restrictions: No      Mobility  Bed Mobility Overal bed mobility: Needs Assistance Bed Mobility: Rolling;Sidelying to Sit;Sit to Supine Rolling: Min assist Sidelying to sit: Min assist   Sit to supine: Min assist   General bed mobility comments: pt attempted to sit straight up in bed with use of rail and was unable. vc's for rolling to L, min A to complete task. Min HHA for elevation of trunk into sitting. With return to supine, pt able to initiate LE's back into bed but needed min A  to get them fully onto mattress    Transfers Overall transfer level: Needs assistance Equipment used: Rolling walker (2 wheeled) Transfers: Sit to/from Stand Sit to Stand: Mod assist         General transfer comment: mod A for power up and fwd wt shift away from bed  Ambulation/Gait Ambulation/Gait assistance: Mod assist Gait Distance (Feet): 3 Feet Assistive device: Rolling walker (2 wheeled) Gait Pattern/deviations: Step-to pattern Gait velocity: decreased Gait velocity interpretation: <1.31 ft/sec, indicative of household ambulator General Gait Details: pt sidestepped 3' along EOB to L, unable to lift LLE, needed facilitation from therapist. Able to slide RLE. Improved minimally with practice  Stairs            Wheelchair Mobility    Modified Rankin (Stroke Patients Only)       Balance Overall balance assessment: Needs assistance;History of Falls Sitting-balance support: No upper extremity supported;Feet supported Sitting balance-Leahy Scale: Fair Sitting balance - Comments: supervision but no physical assist Postural control: Posterior lean Standing balance support: Bilateral upper extremity supported;During functional activity Standing balance-Leahy Scale: Poor Standing balance comment: needed UE support as well as min A due to posterior lean                             Pertinent Vitals/Pain Pain Assessment: No/denies pain    Home Living Family/patient expects to be discharged to:: Private residence Living Arrangements: Parent Available Help at Discharge: Family;Available 24 hours/day Type of  Home: House Home Access: Stairs to enter Entrance Stairs-Rails: Right Entrance Stairs-Number of Steps: 4 Home Layout: One level Home Equipment: Emergency planning/management officer - 2 wheels;Bedside commode Additional Comments: unknown, pt poor historian    Prior Function Level of Independence: Independent with assistive device(s)         Comments: mom has  been helping with dressing, pt has been walking with RW. Decline since covid in december and february. Was working at [employer] prior to 2/22 infection     Hand Dominance   Dominant Hand: Right    Extremity/Trunk Assessment   Upper Extremity Assessment Upper Extremity Assessment: Generalized weakness    Lower Extremity Assessment Lower Extremity Assessment: Generalized weakness;LLE deficits/detail;RLE deficits/detail RLE Deficits / Details: hip flex 3/5, knee ext 3/5 RLE Sensation: WNL RLE Coordination: decreased gross motor LLE Deficits / Details: hip flex 3-/5, knee ext 3-/5 LLE Sensation: WNL LLE Coordination: decreased gross motor    Cervical / Trunk Assessment Cervical / Trunk Assessment: Kyphotic  Communication   Communication: No difficulties  Cognition Arousal/Alertness: Awake/alert Behavior During Therapy: Flat affect Overall Cognitive Status: No family/caregiver present to determine baseline cognitive functioning                                 General Comments: pt has difficulty answering open ended questions, does better with yes/no. STM deficits noted. Can explain home set up with some prompting.      General Comments General comments (skin integrity, edema, etc.): BP sitting 115/81 HR 75 bpm, standing 130/106 HR 86 bpm. No dizziness with transition    Exercises     Assessment/Plan    PT Assessment Patient needs continued PT services  PT Problem List Decreased strength;Decreased activity tolerance;Decreased balance;Decreased mobility;Decreased coordination;Decreased cognition       PT Treatment Interventions DME instruction;Gait training;Stair training;Functional mobility training;Therapeutic activities;Therapeutic exercise;Balance training;Neuromuscular re-education;Cognitive remediation;Patient/family education    PT Goals (Current goals can be found in the Care Plan section)  Acute Rehab PT Goals Patient Stated Goal: return home PT  Goal Formulation: With patient Time For Goal Achievement: 01/26/21 Potential to Achieve Goals: Good    Frequency Min 3X/week   Barriers to discharge        Co-evaluation               AM-PAC PT "6 Clicks" Mobility  Outcome Measure Help needed turning from your back to your side while in a flat bed without using bedrails?: None Help needed moving from lying on your back to sitting on the side of a flat bed without using bedrails?: A Little Help needed moving to and from a bed to a chair (including a wheelchair)?: A Lot Help needed standing up from a chair using your arms (e.g., wheelchair or bedside chair)?: A Lot Help needed to walk in hospital room?: Total Help needed climbing 3-5 steps with a railing? : Total 6 Click Score: 13    End of Session Equipment Utilized During Treatment: Gait belt Activity Tolerance: Patient limited by fatigue Patient left: in bed;with call bell/phone within reach;with bed alarm set Nurse Communication: Mobility status PT Visit Diagnosis: Muscle weakness (generalized) (M62.81);Repeated falls (R29.6);Difficulty in walking, not elsewhere classified (R26.2);Unsteadiness on feet (R26.81)    Time: 4742-5956 PT Time Calculation (min) (ACUTE ONLY): 34 min   Charges:   PT Evaluation $PT Eval Moderate Complexity: 1 Mod PT Treatments $Gait Training: 8-22 mins  Lyanne Co, PT  Acute Rehab Services  Pager [phone] Office [phone]   Lawana Chambers Cypress Hinkson 01/12/2021, 12:01 PM

## 2021-01-12 NOTE — Evaluation (Addendum)
Clinical/Bedside Swallow Evaluation Patient Details  Name: Carolyn Norton MRN: [id number] Date of Birth: [birth year]  Today's Date: 01/12/2021 Time: SLP Start Time (ACUTE ONLY): 1616 SLP Stop Time (ACUTE ONLY): 1633 SLP Time Calculation (min) (ACUTE ONLY): 17.57 min  Past Medical History:  Past Medical History:  Diagnosis Date  . Anxiety   . Asthma   . COVID    History of  . Depression   . Diabetes mellitus without complication (HCC)   . Hyperlipidemia   . Hypertension   . Seasonal allergies    Past Surgical History:  Past Surgical History:  Procedure Laterality Date  . BREAST EXCISIONAL BIOPSY Right   . BREAST SURGERY     right breast   HPI:  Pt is a 55 y.o. female with medical history significant for DM2, GERD, COPD, bipolar disorder type 1, previous COVID-19 infection, polysubstance abuse including cocaine. pt presented from home due to generalized weakness, multiple falls and confusion. CXR 5/7 was negative for active disease. CT head 5/7 negative for acute changes. Dx acute metabolic encephalopathy. BSE on 10/24/20 with f/u on 10/15/20 and recommendation for regular texture solids and thin liquids.   Assessment / Plan / Recommendation Clinical Impression  Pt was seen for bedside swallow evaluation and she denied a history of dysphagia. Oral mechanism exam was [facility] and dentition was adequate with some absent molars. Pt stated that she consumes some soft solids but is able to eat regular textures as well. Pt's nurse, Mo, denied any symptoms of dysphagia with p.o. intake. Pt tolerated all solids and liquids without signs or symptoms of oropharyngeal dysphagia. Mastication was functionally increased due to dentition, but no significant oral residue was noted. A regular texture diet with thin liquids is recommended at this time and further skilled SLP services are not clinically indicated for swallowing. SLP Visit Diagnosis: Dysphagia, unspecified (R13.10)    Aspiration Risk  No  limitations    Diet Recommendation Regular;Thin liquid   Liquid Administration via: Cup;Straw Medication Administration: Whole meds with liquid Supervision: Staff to assist with self feeding Compensations: Minimize environmental distractions Postural Changes: Seated upright at 90 degrees    Other  Recommendations Oral Care Recommendations: Oral care BID   Follow up Recommendations None      Frequency and Duration            Prognosis Prognosis for Safe Diet Advancement: Good Barriers to Reach Goals: Cognitive deficits      Swallow Study   General Date of Onset: 01/11/21 HPI: Pt is a 55 y.o. female with medical history significant for DM2, GERD, COPD, bipolar disorder type 1, previous COVID-19 infection, polysubstance abuse including cocaine. pt presented from home due to generalized weakness, multiple falls and confusion. CXR 5/7 was negative for active disease. CT head 5/7 negative for acute changes. Dx acute metabolic encephalopathy. BSE on 10/24/20 with f/u on 10/15/20 and recommendation for regular texture solids and thin liquids. Type of Study: Bedside Swallow Evaluation Previous Swallow Assessment: See HPI Diet Prior to this Study: Regular;Thin liquids Temperature Spikes Noted: No Respiratory Status: Nasal cannula History of Recent Intubation: No Behavior/Cognition: Alert;Cooperative Oral Cavity Assessment: Within Functional Limits Oral Care Completed by SLP: No Oral Cavity - Dentition: Adequate natural dentition Vision: Functional for self-feeding Self-Feeding Abilities: Able to feed self Patient Positioning: Upright in bed;Postural control adequate for testing Baseline Vocal Quality: Normal Volitional Cough: Strong Volitional Swallow: Able to elicit    Oral/Motor/Sensory Function Overall Oral Motor/Sensory Function: Within functional limits   Ice Chips  Ice chips: Not tested   Thin Liquid Thin Liquid: Within functional limits Presentation: Straw    Nectar Thick  Nectar Thick Liquid: Not tested   Honey Thick Honey Thick Liquid: Not tested   Puree Puree: Within functional limits Presentation: Spoon   Solid     Solid: Within functional limits Presentation: Self Fed     Carolyn Norton I. Vear Clock, MS, CCC-SLP Acute Rehabilitation Services Office number [phone] Pager [phone]   Carolyn Norton 01/12/2021,4:33 PM

## 2021-01-13 LAB — COMPREHENSIVE METABOLIC PANEL
ALT: 9 U/L (ref 0–44)
AST: 14 U/L — ABNORMAL LOW (ref 15–41)
Albumin: 2.2 g/dL — ABNORMAL LOW (ref 3.5–5.0)
Alkaline Phosphatase: 35 U/L — ABNORMAL LOW (ref 38–126)
Anion gap: 6 (ref 5–15)
BUN: 12 mg/dL (ref 6–20)
CO2: 32 mmol/L (ref 22–32)
Calcium: 8.2 mg/dL — ABNORMAL LOW (ref 8.9–10.3)
Chloride: 98 mmol/L (ref 98–111)
Creatinine, Ser: 1.06 mg/dL — ABNORMAL HIGH (ref 0.44–1.00)
GFR, Estimated: >60 mL/min (ref >60)
Glucose, Bld: 162 mg/dL — ABNORMAL HIGH (ref 70–99)
Potassium: 2.7 mmol/L — CL (ref 3.5–5.1)
Sodium: 136 mmol/L (ref 135–145)
Total Bilirubin: 0.2 mg/dL — ABNORMAL LOW (ref 0.3–1.2)
Total Protein: 4.8 g/dL — ABNORMAL LOW (ref 6.5–8.1)

## 2021-01-13 LAB — GLUCOSE, CAPILLARY
Glucose-Capillary: 86 mg/dL (ref 70–99)
Glucose-Capillary: 218 mg/dL — ABNORMAL HIGH (ref 70–99)
Glucose-Capillary: 250 mg/dL — ABNORMAL HIGH (ref 70–99)
Glucose-Capillary: 271 mg/dL — ABNORMAL HIGH (ref 70–99)
Glucose-Capillary: 194 mg/dL — ABNORMAL HIGH (ref 70–99)
Glucose-Capillary: 171 mg/dL — ABNORMAL HIGH (ref 70–99)

## 2021-01-13 LAB — CBC WITH DIFFERENTIAL/PLATELET
Abs Immature Granulocytes: 0.02 10*3/uL (ref 0.00–0.07)
Basophils Absolute: 0 10*3/uL (ref 0.0–0.1)
Basophils Relative: 1 %
Eosinophils Absolute: 0.1 10*3/uL (ref 0.0–0.5)
Eosinophils Relative: 2 %
HCT: 36.2 % (ref 36.0–46.0)
Hemoglobin: 12.2 g/dL (ref 12.0–15.0)
Immature Granulocytes: 0 %
Lymphocytes Relative: 43 %
Lymphs Abs: 2.5 10*3/uL (ref 0.7–4.0)
MCH: 29 pg (ref 26.0–34.0)
MCHC: 33.7 g/dL (ref 30.0–36.0)
MCV: 86 fL (ref 80.0–100.0)
Monocytes Absolute: 0.7 10*3/uL (ref 0.1–1.0)
Monocytes Relative: 11 %
Neutro Abs: 2.5 10*3/uL (ref 1.7–7.7)
Neutrophils Relative %: 43 %
Platelets: 129 10*3/uL — ABNORMAL LOW (ref 150–400)
RBC: 4.21 MIL/uL (ref 3.87–5.11)
RDW: 13.9 % (ref 11.5–15.5)
WBC: 5.8 10*3/uL (ref 4.0–10.5)
nRBC: 0 % (ref 0.0–0.2)

## 2021-01-13 LAB — MAGNESIUM: Magnesium: 1.5 mg/dL — ABNORMAL LOW (ref 1.7–2.4)

## 2021-01-13 LAB — BRAIN NATRIURETIC PEPTIDE: B Natriuretic Peptide: 219.6 pg/mL — ABNORMAL HIGH (ref 0.0–100.0)

## 2021-01-13 LAB — PROCALCITONIN: Procalcitonin: 0.11 ng/mL

## 2021-01-13 MED ORDER — INSULIN ASPART 100 UNIT/ML IJ SOLN: 0.0000 [IU] | Freq: Every day | INTRAMUSCULAR | Status: DC

## 2021-01-13 MED ORDER — POTASSIUM CHLORIDE CRYS ER 20 MEQ PO TBCR
40.0000 meq | EXTENDED_RELEASE_TABLET | Freq: Once | ORAL | Status: AC
  Administered 2021-01-13: 40 meq via ORAL
  Filled 2021-01-13: qty 2

## 2021-01-13 MED ORDER — POTASSIUM CHLORIDE CRYS ER 20 MEQ PO TBCR
20.0000 meq | EXTENDED_RELEASE_TABLET | Freq: Once | ORAL | Status: AC
  Administered 2021-01-13: 20 meq via ORAL
  Filled 2021-01-13: qty 1

## 2021-01-13 MED ORDER — INSULIN ASPART 100 UNIT/ML IJ SOLN
0.0000 [IU] | Freq: Three times a day (TID) | INTRAMUSCULAR | Status: DC
  Administered 2021-01-13: 8 [IU] via SUBCUTANEOUS
  Administered 2021-01-13: 3 [IU] via SUBCUTANEOUS
  Administered 2021-01-14 (×2): 5 [IU] via SUBCUTANEOUS
  Administered 2021-01-15: 3 [IU] via SUBCUTANEOUS

## 2021-01-13 MED ORDER — GABAPENTIN 300 MG PO CAPS
300.0000 mg | ORAL_CAPSULE | Freq: Two times a day (BID) | ORAL | Status: DC
  Administered 2021-01-13 – 2021-03-04 (×102): 300 mg via ORAL
  Filled 2021-01-13 (×104): qty 1

## 2021-01-13 MED ORDER — LACTATED RINGERS IV SOLN: INTRAVENOUS | Status: AC

## 2021-01-13 MED ORDER — POTASSIUM CHLORIDE 10 MEQ/100ML IV SOLN
10.0000 meq | INTRAVENOUS | Status: AC
  Administered 2021-01-13 (×3): 10 meq via INTRAVENOUS
  Filled 2021-01-13 (×3): qty 100

## 2021-01-13 MED ORDER — INSULIN GLARGINE 100 UNIT/ML ~~LOC~~ SOLN
25.0000 [IU] | Freq: Two times a day (BID) | SUBCUTANEOUS | Status: DC
  Administered 2021-01-13 (×2): 25 [IU] via SUBCUTANEOUS
  Filled 2021-01-13 (×5): qty 0.25

## 2021-01-13 MED ORDER — MAGNESIUM SULFATE 2 GM/50ML IV SOLN
2.0000 g | Freq: Once | INTRAVENOUS | Status: AC
  Administered 2021-01-13: 2 g via INTRAVENOUS
  Filled 2021-01-13: qty 50

## 2021-01-13 MED ORDER — MAGNESIUM SULFATE IN D5W 1-5 GM/100ML-% IV SOLN
1.0000 g | Freq: Once | INTRAVENOUS | Status: AC
  Administered 2021-01-13: 1 g via INTRAVENOUS
  Filled 2021-01-13: qty 100

## 2021-01-13 NOTE — Progress Notes (Signed)
Date and time results received: 01/13/21 0600  Test: Potassium  Critical Value: 2.7  Name of Provider Notified: Shalhoub

## 2021-01-13 NOTE — Plan of Care (Signed)
  Problem: Clinical Measurements: Goal: Respiratory complications will improve Outcome: Progressing   Problem: Clinical Measurements: Goal: Cardiovascular complication will be avoided Outcome: Progressing   Problem: Coping: Goal: Level of anxiety will decrease Outcome: Progressing   Problem: Pain Managment: Goal: General experience of comfort will improve Outcome: Progressing   

## 2021-01-13 NOTE — Progress Notes (Signed)
PROGRESS NOTE                                                                                                                                                                                                             Patient Demographics:    Carolyn Norton, is a 55 y.o. female, DOB - 11-07-1965, KXF:[id number]  Outpatient Primary MD for the patient is [name], FNP    LOS - 1  Admit date - 01/11/2021    Chief Complaint  Patient presents with  . Generalized Weakness  . Multiple Falls  . Medication Noncompliance  . Altered Mental Status  . Hyperglycemia       Brief Narrative (HPI from H&P)  -  Carolyn Norton is a 55 y.o. female with medical history significant for DM2, bipolar disorder type 1, previous covid 19 infection, polysubstance abuse including cocaine, who presented from home where she lives with her parents due to generalized weakness, multiple falls and confusion, I called her mother and was informed that patient has been in very poor health with inability to walk without falling over for at least a month, she did her last dose of crack cocaine about a month ago and ever since then she has been not doing well, poor balance multiple falls, not taking her medications, sugars running high.  In the ER she was diagnosed with severe dehydration, elevated sugars, metabolic encephalopathy and she was admitted to the hospital.   Subjective:   Patient in bed, appears comfortable, denies any headache, no fever, no chest pain or pressure, no shortness of breath , no abdominal pain. No new focal weakness.   Assessment  & Plan :     1. Acute metabolic encephalopathy due to combination of severe dehydration, poorly controlled type 2 diabetes mellitus with hyperglycemia - she will be treated supportively with Lantus and sliding scale, hydrate with IV fluids, CT head is nonacute, she does not have any focal deficits,  much improved after supportive care continue to monitor, advance activity, PT OT to evaluate.  2.  History of polysubstance abuse including crack cocaine abuse.  Counseled to quit.  3.  History of bipolar disorder.  Resume home dose Neurontin will monitor.  No acute issues currently  4.  COPD.  Supportive care no acute issues.  5.  AKI on CKD 3A.  Baseline creatinine around 1.3.  AKI resolved after hydration with IV fluid.  6. GERD - PPI  7.  Hypokalemia and hypomagnesemia.  Replaced.  Will monitor.  8.  Dyslipidemia.  On home dose statin.  9. DM type II with hyperglycemia.  A1c of 11.6 suggesting poor glycemic control outpatient due to hyperglycemia.  For now Lantus twice daily along with sliding scale, dose adjusted on 01/13/2021 we will closely monitor.  We will also involve diabetic educator.   Lab Results  Component Value Date   HGBA1C 11.0 (H) 01/12/2021   CBG (last 3)  Recent Labs    01/13/21 0151 01/13/21 0613 01/13/21 0744  GLUCAP 86 218* 250*        Condition -  Guarded  Family Communication  :  Mother Carolyn BeechamCynthia [phone] - 01/12/21  Code Status : DNR  Consults  :    PUD Prophylaxis : PPI   Procedures  :     CT Head - Non Acute      Disposition Plan  :     Dispo:  Patient From: Home  Planned Disposition: Home  Medically stable for discharge: No     DVT Prophylaxis  :    enoxaparin (LOVENOX) injection 40 mg Start: 01/12/21 1000     Lab Results  Component Value Date   PLT 129 (L) 01/13/2021    Diet :  Diet Order            Diet heart healthy/carb modified Room service appropriate? Yes; Fluid consistency: Thin  Diet effective now                  Inpatient Medications  Scheduled Meds: . aspirin  81 mg Oral Daily  . atorvastatin  20 mg Oral Daily  . enoxaparin (LOVENOX) injection  40 mg Subcutaneous Daily  . escitalopram  20 mg Oral Daily  . insulin aspart  0-15 Units Subcutaneous TID WC  . insulin aspart  0-5 Units Subcutaneous  QHS  . insulin glargine  25 Units Subcutaneous BID  . montelukast  10 mg Oral QHS  . pantoprazole  40 mg Oral Daily  . potassium chloride  20 mEq Oral Once   Continuous Infusions: . lactated ringers 50 mL/hr at 01/13/21 0706  . magnesium sulfate bolus IVPB    . magnesium sulfate bolus IVPB    . potassium chloride 10 mEq (01/13/21 0708)   PRN Meds:.acetaminophen, albuterol, dextrose, melatonin, ondansetron (ZOFRAN) IV  Antibiotics  :    Anti-infectives (From admission, onward)   None       Time Spent in minutes  30   Susa RaringPrashant Delando Satter M.D on 01/13/2021 at 8:30 AM  To page go to [email or web address]   [facility] -  Office  [phone]    See all Orders from today for further details    Objective:   Vitals:   01/12/21 0012 01/12/21 0520 01/12/21 1329 01/12/21 1925  BP: 135/73 131/74 (!) 143/93 131/67  Pulse: 67 64 71 62  Resp: 18 16 16 18   Temp: 97.7 F (36.5 C) 97.6 F (36.4 C) 98 F (36.7 C) 97.7 F (36.5 C)  TempSrc:  Oral Oral Oral  SpO2: 96% 95% 97% 97%  Weight:      Height:        Wt Readings from Last 3 Encounters:  01/11/21 99.8 kg  11/21/20 85.5 kg  10/22/20 85.5 kg     Intake/Output Summary (Last 24 hours) at 01/13/2021 0830 Last data filed at 01/13/2021 [phone]  Gross per 24 hour  Intake 240 ml  Output 1350 ml  Net -1110 ml     Physical Exam  Awake Alert, No new F.N deficits, Normal affect [name],[name] Supple Neck,No JVD, No cervical lymphadenopathy appriciated.  Symmetrical Chest wall movement, Good air movement bilaterally, CTAB RRR,No Gallops, Rubs or new Murmurs, No Parasternal Heave +ve B.Sounds, Abd Soft, No tenderness, No organomegaly appriciated, No rebound - guarding or rigidity. No Cyanosis, Clubbing or edema, No new Rash or bruise   Data Review:    CBC Recent Labs  Lab 01/11/21 1227 01/11/21 1236 01/12/21 0232 01/13/21 0349  WBC 14.1*  --  9.2 5.8  HGB 13.5 14.6 12.6 12.2  HCT 41.6 43.0 39.1 36.2  PLT 175  --  137*  129*  MCV 87.9  --  88.9 86.0  MCH 28.5  --  28.6 29.0  MCHC 32.5  --  32.2 33.7  RDW 13.5  --  13.9 13.9  LYMPHSABS 1.6  --   --  2.5  MONOABS 1.0  --   --  0.7  EOSABS 0.0  --   --  0.1  BASOSABS 0.0  --   --  0.0    Recent Labs  Lab 01/11/21 1227 01/11/21 1236 01/12/21 0232 01/12/21 1403 01/13/21 0349  NA 127* 128* 136  --  136  K 4.7 4.7 5.3*  --  2.7*  CL 88*  --  102  --  98  CO2 27  --  22  --  32  GLUCOSE 729*  --  249*  --  162*  BUN 21*  --  20  --  12  CREATININE 1.59*  --  1.21*  --  1.06*  CALCIUM 8.7*  --  8.1*  --  8.2*  AST 11*  --   --   --  14*  ALT 8  --   --   --  9  ALKPHOS 85  --   --   --  35*  BILITOT 0.9  --   --   --  0.2*  ALBUMIN 3.0*  --   --   --  2.2*  MG  --   --  1.9  --  1.5*  PROCALCITON  --   --   --  0.15  --   HGBA1C  --   --  11.0*  --   --   BNP  --   --   --   --  219.6*    ------------------------------------------------------------------------------------------------------------------ No results for input(s): CHOL, HDL, LDLCALC, TRIG, CHOLHDL, LDLDIRECT in the last 72 hours.  Lab Results  Component Value Date   HGBA1C 11.0 (H) 01/12/2021   ------------------------------------------------------------------------------------------------------------------ No results for input(s): TSH, T4TOTAL, T3FREE, THYROIDAB in the last 72 hours.  Invalid input(s): FREET3  Cardiac Enzymes No results for input(s): CKMB, TROPONINI, MYOGLOBIN in the last 168 hours.  Invalid input(s): CK ------------------------------------------------------------------------------------------------------------------    Component Value Date/Time   BNP 219.6 (H) 01/13/2021 0349    Micro Results Recent Results (from the past 240 hour(s))  Resp Panel by RT-PCR (Flu A&B, Covid) Nasopharyngeal Swab     Status: None   Collection Time: 01/11/21 12:35 PM   Specimen: Nasopharyngeal Swab; Nasopharyngeal(NP) swabs in vial transport medium  Result Value Ref  Range Status   SARS Coronavirus 2 by RT PCR NEGATIVE NEGATIVE Final    Comment: (NOTE) SARS-CoV-2 target nucleic acids are NOT DETECTED.  The SARS-CoV-2 RNA is generally detectable in upper respiratory specimens  during the acute phase of infection. The lowest concentration of SARS-CoV-2 viral copies this assay can detect is 138 copies/mL. A negative result does not preclude SARS-Cov-2 infection and should not be used as the sole basis for treatment or other patient management decisions. A negative result may occur with  improper specimen collection/handling, submission of specimen other than nasopharyngeal swab, presence of viral mutation(s) within the areas targeted by this assay, and inadequate number of viral copies(<138 copies/mL). A negative result must be combined with clinical observations, patient history, and epidemiological information. The expected result is Negative.  Fact Sheet for Patients:  BloggerCourse.com  Fact Sheet for Healthcare Providers:  SeriousBroker.it  This test is no t yet approved or cleared by the [place] FDA and  has been authorized for detection and/or diagnosis of SARS-CoV-2 by FDA under an Emergency Use Authorization (EUA). This EUA will remain  in effect (meaning this test can be used) for the duration of the COVID-19 declaration under Section 564(b)(1) of the Act, 21 U.S.C.section 360bbb-3(b)(1), unless the authorization is terminated  or revoked sooner.       Influenza A by PCR NEGATIVE NEGATIVE Final   Influenza B by PCR NEGATIVE NEGATIVE Final    Comment: (NOTE) The Xpert Xpress SARS-CoV-2/FLU/RSV plus assay is intended as an aid in the diagnosis of influenza from Nasopharyngeal swab specimens and should not be used as a sole basis for treatment. Nasal washings and aspirates are unacceptable for Xpert Xpress SARS-CoV-2/FLU/RSV testing.  Fact Sheet for  Patients: BloggerCourse.com  Fact Sheet for Healthcare Providers: SeriousBroker.it  This test is not yet approved or cleared by the [place] FDA and has been authorized for detection and/or diagnosis of SARS-CoV-2 by FDA under an Emergency Use Authorization (EUA). This EUA will remain in effect (meaning this test can be used) for the duration of the COVID-19 declaration under Section 564(b)(1) of the Act, 21 U.S.C. section 360bbb-3(b)(1), unless the authorization is terminated or revoked.  Performed at [facility], [address], [place], [place] [address]     Radiology Reports CT Head Wo Contrast  Result Date: 01/11/2021 CLINICAL DATA:  Mental status change.  Cause unknown. EXAM: CT HEAD WITHOUT CONTRAST TECHNIQUE: Contiguous axial images were obtained from the base of the skull through the vertex without intravenous contrast. COMPARISON:  CT head 10/22/2020 FINDINGS: Brain: Similar-appearing prominence of the lateral ventricles may be related to central predominant atrophy, although a component of normal pressure/communicating hydrocephalus cannot be excluded. No evidence of large-territorial acute infarction. No parenchymal hemorrhage. No mass lesion. No extra-axial collection. Patchy and confluent areas of decreased attenuation are noted throughout the deep and periventricular white matter of the cerebral hemispheres bilaterally, compatible with chronic microvascular ischemic disease. Hypodensity along the right pulse cerebral unchanged. No mass effect or midline shift. No hydrocephalus. Basilar cisterns are patent. Vascular: No hyperdense vessel. Atherosclerotic calcifications are present within the cavernous internal carotid arteries. Skull: No acute fracture or focal lesion. Sinuses/Orbits: Paranasal sinuses and mastoid air cells are clear. The orbits are unremarkable. Other: None. IMPRESSION: 1. No acute intracranial  abnormality. 2. Similar-appearing prominence of the lateral ventricles may be related to central predominant atrophy, although a component of normal pressure/communicating hydrocephalus cannot be excluded. Electronically Signed   By: Tish Frederickson M.D.   On: 01/11/2021 23:46   DG CHEST PORT 1 VIEW  Result Date: 01/11/2021 CLINICAL DATA:  Weakness EXAM: PORTABLE CHEST 1 VIEW COMPARISON:  10/22/2020 FINDINGS: Cardiac shadow is enlarged but accentuated by the  portable technique. Aortic calcifications are noted. The lungs are clear. No bony abnormality is noted. IMPRESSION: No acute abnormality noted. Electronically Signed   By: Alcide Clever M.D.   On: 01/11/2021 23:42

## 2021-01-13 NOTE — Evaluation (Signed)
Occupational Therapy Evaluation Patient Details Name: Carolyn Norton MRN: [id number] DOB: [birth year] Today's Date: 01/13/2021    History of Present Illness Pt presents from home with multiple falls, confusion, and weakness. In ED blood glucose was 729 and pt was unable to ambulate.  Patient admitted with severe dehydration and metabolic encephalopathy. PMH: DM2, COPD, CKD3, GERD, bipolar disorder type 1, previous covid 19 infection, polysubstance abuse including cocaine.   Clinical Impression   PTA patient was living with her mother and father in a private residence and was grossly I with ADLs/IADLs without AD prior to 10/2020. Per chart review, patient's mother reports recent decline in function with patient requiring assist with ADLs/IADLs and use of RW.  Patient currently functioning below baseline demonstrating observed ADLs including LB dressing and simulated ADL transfers with Min A and use of RW. Patient also limited by deficits listed below including generalized weakness and decreased cognition and would benefit from continued acute OT services in prep for safe d/c to next level of care with recommendation for SNF rehab. OT will continue to follow acutely.       Follow Up Recommendations  SNF;Supervision/Assistance - 24 hour    Equipment Recommendations  Other (comment) (Defer to next level of care.)    Recommendations for Other Services       Precautions / Restrictions Precautions Precautions: Fall Restrictions Weight Bearing Restrictions: No      Mobility Bed Mobility Overal bed mobility: Needs Assistance Bed Mobility: Supine to Sit   Sidelying to sit: Min assist       General bed mobility comments: Patient able to advance BLE from bed surface to EOB. Min A to elevate trunk + use of bed rail.    Transfers Overall transfer level: Needs assistance Equipment used: Rolling walker (2 wheeled) Transfers: Sit to/from Stand Sit to Stand: Mod assist          General transfer comment: Mod A with cues for hand placement and sequencing.    Balance Overall balance assessment: Needs assistance;History of Falls Sitting-balance support: No upper extremity supported;Feet supported Sitting balance-Leahy Scale: Fair Sitting balance - Comments: Supervision A for safety. Postural control: Posterior lean Standing balance support: Bilateral upper extremity supported;During functional activity Standing balance-Leahy Scale: Poor Standing balance comment: Requires external assist to maintain static standing balance.                           ADL either performed or assessed with clinical judgement   ADL Overall ADL's : Needs assistance/impaired Eating/Feeding: Set up                   Lower Body Dressing: Moderate assistance   Toilet Transfer: Moderate assistance;RW Toilet Transfer Details (indicate cue type and reason): Simulated with stand-pivot to recliner with use of RW. Mod A with cues for sequencing and hand placement.           General ADL Comments: Patient limited by decreased cognition, generalized weakness, and need for Mod A to Max A grossly for ADLs with use of RW.     Vision         Perception     Praxis      Pertinent Vitals/Pain Pain Assessment: No/denies pain     Hand Dominance Right   Extremity/Trunk Assessment Upper Extremity Assessment Upper Extremity Assessment: Generalized weakness       Cervical / Trunk Assessment Cervical / Trunk Assessment: Kyphotic   Communication Communication Communication:  No difficulties   Cognition Arousal/Alertness: Awake/alert Behavior During Therapy: Flat affect Overall Cognitive Status: No family/caregiver present to determine baseline cognitive functioning                                 General Comments: Patient with difficulty answering open ended questions. Can answer yes/no questions. Difficulty confirming home set-up this date 2/2  lethargy and encephalopathy.   General Comments  VSS on RA.    Exercises     Shoulder Instructions      Home Living Family/patient expects to be discharged to:: Private residence Living Arrangements: Parent Available Help at Discharge: Family;Available 24 hours/day Type of Home: House Home Access: Stairs to enter [employer] of Steps: 4 Entrance Stairs-Rails: Right Home Layout: One level     Bathroom Shower/Tub: Chief Strategy Officer: Standard     Home Equipment: Emergency planning/management officer - 2 wheels;Bedside commode   Additional Comments: unknown, pt poor historian      Prior Functioning/Environment Level of Independence: Independent with assistive device(s)        Comments: Patient independent and working prior to 10/2020. Per PT evaluation, patient with recent decline in function requiring assist from mother with ADLs. Patient has also been using RW.        OT Problem List: Decreased strength;Decreased activity tolerance;Impaired balance (sitting and/or standing);Decreased cognition;Decreased safety awareness;Decreased knowledge of use of DME or AE      OT Treatment/Interventions: Self-care/ADL training;Therapeutic exercise;Energy conservation;DME and/or AE instruction;Therapeutic activities;Patient/family education;Balance training    OT Goals(Current goals can be found in the care plan section) Acute Rehab OT Goals Patient Stated Goal: No goals stated. OT Goal Formulation: Patient unable to participate in goal setting Time For Goal Achievement: 01/27/21 Potential to Achieve Goals: Good ADL Goals Pt Will Perform Grooming: with supervision;standing Pt Will Perform Upper Body Dressing: sitting;with set-up Pt Will Perform Lower Body Dressing: with supervision;sit to/from stand Pt Will Transfer to Toilet: with supervision;ambulating Pt Will Perform Toileting - Clothing Manipulation and hygiene: with supervision;sit to/from stand Pt/caregiver will  Perform Home Exercise Program: Increased strength;Both right and left upper extremity;With theraband;With written HEP provided Additional ADL Goal #1: Patient will recall 3 fall risk reducing strategies in prep for safe d/c to next level of care.  OT Frequency: Min 2X/week   Barriers to D/C: Inaccessible home environment;Decreased caregiver support  Mother unable to provide current level of assist.       Co-evaluation              AM-PAC OT "6 Clicks" Daily Activity     Outcome Measure Help from another person eating meals?: A Little Help from another person taking care of personal grooming?: A Little Help from another person toileting, which includes using toliet, bedpan, or urinal?: A Lot Help from another person bathing (including washing, rinsing, drying)?: A Lot Help from another person to put on and taking off regular upper body clothing?: A Little Help from another person to put on and taking off regular lower body clothing?: A Lot 6 Click Score: 15   End of Session Equipment Utilized During Treatment: Gait belt;Rolling walker Nurse Communication: Mobility status;Other (comment) (RN notified of patient position. No green boxes on unit. Nurse secretary notified.)  Activity Tolerance: Patient limited by lethargy Patient left: in chair;with call bell/phone within reach  OT Visit Diagnosis: Unsteadiness on feet (R26.81);Other abnormalities of gait and mobility (R26.89);Repeated falls (R29.6);Muscle weakness (generalized) (  M62.81)                Time: 6712-4580 OT Time Calculation (min): 19 min Charges:  OT General Charges $OT Visit: 1 Visit OT Evaluation $OT Eval Moderate Complexity: 1 Mod  Ramzi Brathwaite H. OTR/L Supplemental OT, Department of rehab services [phone]  Kla Bily R H. 01/13/2021, 1:35 PM

## 2021-01-13 NOTE — TOC CAGE-AID Note (Signed)
Transition of Care [facility]) - CAGE-AID Screening   Patient Details  Name: Carolyn Norton MRN: [id number] Date of Birth: September 26, 1965  [facility]) CM/SW Contact:    Beckie Busing, RN Phone Number: 01/13/2021, 9:35 AM   Clinical Narrative: TOC at bedside to discuss drug abuse. Patient states that she never really felt like she was using too much. All questions answered and resources provided. TOC will continue to follow.   CAGE-AID Screening:    Have You Ever Felt You Ought to Cut Down on Your Drinking or Drug Use?: No Have People Annoyed You By Critizing Your Drinking Or Drug Use?: Yes Have You Felt Bad Or Guilty About Your Drinking Or Drug Use?: No Have You Ever Had a Drink or Used Drugs First Thing In The Morning to Steady Your Nerves or to Get Rid of a Hangover?: No CAGE-AID Score: 1  Substance Abuse Education Offered: Yes  Substance abuse interventions: Transport planner

## 2021-01-13 NOTE — Progress Notes (Signed)
Inpatient Diabetes Program Recommendations  AACE/ADA: New Consensus Statement on Inpatient Glycemic Control (2015)  Target Ranges:  Prepandial:   less than 140 mg/dL      Peak postprandial:   less than 180 mg/dL (1-2 hours)      Critically ill patients:  140 - 180 mg/dL   Lab Results  Component Value Date   GLUCAP 250 (H) 01/13/2021   HGBA1C 11.0 (H) 01/12/2021    Review of Glycemic Control  Diabetes history: DM2 Outpatient Diabetes medications: Lantus 45 units QHS Current orders for Inpatient glycemic control: Lantus 25 units BID, Novolog 0-15 units TID with meals and 0-5 HS  HgbA1C - 11%. Hypoglycemia this am.  Inpatient Diabetes Program Recommendations:     Decrease Lantus to 15 units BID  Spoke with pt on 5/9 and spoke with Mother on 5/10 about pt's diabetes control at home. Mother said pt was not taking her Lantus because she had a lot of lows and just stopped taking it. Mother does not give insulin to pt. Said she and husband cannot take care of pt and want her go to SNF and get stronger. Mom thinks pt's been unable to take care of herself since having Covid back in December. [place] Spaniel pt is incontinent and throws herself on the floor all the time.   For discharge, pt will need both basal and bolus insulin with CBG checks at least 4x/day.   Will continue following while inpatient.  Thank you. Ailene Ards, RD, LDN, CDE Inpatient Diabetes Coordinator [phone]

## 2021-01-14 LAB — CBC WITH DIFFERENTIAL/PLATELET
Abs Immature Granulocytes: 0.02 10*3/uL (ref 0.00–0.07)
Basophils Absolute: 0 10*3/uL (ref 0.0–0.1)
Basophils Relative: 1 %
Eosinophils Absolute: 0.1 10*3/uL (ref 0.0–0.5)
Eosinophils Relative: 3 %
HCT: 36.2 % (ref 36.0–46.0)
Hemoglobin: 11.9 g/dL — ABNORMAL LOW (ref 12.0–15.0)
Immature Granulocytes: 0 %
Lymphocytes Relative: 43 %
Lymphs Abs: 2.3 10*3/uL (ref 0.7–4.0)
MCH: 28.5 pg (ref 26.0–34.0)
MCHC: 32.9 g/dL (ref 30.0–36.0)
MCV: 86.8 fL (ref 80.0–100.0)
Monocytes Absolute: 0.8 10*3/uL (ref 0.1–1.0)
Monocytes Relative: 16 %
Neutro Abs: 1.9 10*3/uL (ref 1.7–7.7)
Neutrophils Relative %: 37 %
Platelets: UNDETERMINED 10*3/uL (ref 150–400)
RBC: 4.17 MIL/uL (ref 3.87–5.11)
RDW: 14.2 % (ref 11.5–15.5)
WBC: 5.2 10*3/uL (ref 4.0–10.5)
nRBC: 0 % (ref 0.0–0.2)

## 2021-01-14 LAB — COMPREHENSIVE METABOLIC PANEL
ALT: 10 U/L (ref 0–44)
AST: 13 U/L — ABNORMAL LOW (ref 15–41)
Albumin: 2.3 g/dL — ABNORMAL LOW (ref 3.5–5.0)
Alkaline Phosphatase: 42 U/L (ref 38–126)
Anion gap: 4 — ABNORMAL LOW (ref 5–15)
BUN: 8 mg/dL (ref 6–20)
CO2: 32 mmol/L (ref 22–32)
Calcium: 8.2 mg/dL — ABNORMAL LOW (ref 8.9–10.3)
Chloride: 102 mmol/L (ref 98–111)
Creatinine, Ser: 1.02 mg/dL — ABNORMAL HIGH (ref 0.44–1.00)
GFR, Estimated: >60 mL/min (ref >60)
Glucose, Bld: 137 mg/dL — ABNORMAL HIGH (ref 70–99)
Potassium: 3.4 mmol/L — ABNORMAL LOW (ref 3.5–5.1)
Sodium: 138 mmol/L (ref 135–145)
Total Bilirubin: 0.2 mg/dL — ABNORMAL LOW (ref 0.3–1.2)
Total Protein: 5.1 g/dL — ABNORMAL LOW (ref 6.5–8.1)

## 2021-01-14 LAB — BRAIN NATRIURETIC PEPTIDE: B Natriuretic Peptide: 177.8 pg/mL — ABNORMAL HIGH (ref 0.0–100.0)

## 2021-01-14 LAB — GLUCOSE, CAPILLARY
Glucose-Capillary: 41 mg/dL — CL (ref 70–99)
Glucose-Capillary: 86 mg/dL (ref 70–99)
Glucose-Capillary: 238 mg/dL — ABNORMAL HIGH (ref 70–99)
Glucose-Capillary: 245 mg/dL — ABNORMAL HIGH (ref 70–99)
Glucose-Capillary: 128 mg/dL — ABNORMAL HIGH (ref 70–99)

## 2021-01-14 LAB — MAGNESIUM: Magnesium: 1.9 mg/dL (ref 1.7–2.4)

## 2021-01-14 LAB — PROCALCITONIN: Procalcitonin: <0.1 ng/mL

## 2021-01-14 MED ORDER — INSULIN GLARGINE 100 UNIT/ML ~~LOC~~ SOLN
25.0000 [IU] | Freq: Every day | SUBCUTANEOUS | Status: DC
  Administered 2021-01-14: 25 [IU] via SUBCUTANEOUS
  Filled 2021-01-14 (×2): qty 0.25

## 2021-01-14 MED ORDER — POTASSIUM CHLORIDE CRYS ER 20 MEQ PO TBCR
40.0000 meq | EXTENDED_RELEASE_TABLET | Freq: Once | ORAL | Status: AC
  Administered 2021-01-14: 40 meq via ORAL
  Filled 2021-01-14: qty 2

## 2021-01-14 NOTE — TOC Initial Note (Addendum)
Transition of Care [facility]) - Initial/Assessment Note    Patient Details  Name: Carolyn Norton MRN: [id number] Date of Birth: 1966-08-27  Transition of Care [facility]) CM/SW Contact:    Beckie Busing, RN Phone Number: [phone]  01/14/2021, 10:03 AM  Clinical Narrative:                 [facility] consulted for patient with SNF referral. Patient is from home with parents. Patient states that she is going home with parents at discharge. Patient has no DME at home. CAGE aid completed and resources given to patient. Patient gives CM verbal consent to discuss her care with her Aunt [name].   0930 CM received call from patients Aunt [name]. Dewayne Hatch states that the patient needs to be placed in a facility because she can not walk or go to the bathroom on her own. Per Aunt patients parents can not take care of her and will not be coming to the hospital to pick her up. Aunt states that it is impossible for the parents to care for patient. CM has explained to aunt that there is a process and the initial step will be to make sure financial counselors have seen the patient and will assist patient completing medicaid application if patient is deemed eligible to apply. CM has also explained that there is a process for SNF placement for uninsured patients and that patient would need to meet that criteria. Aunt continues to ramble on about how the patient has been in the hospital several times and each time she was discharged with no help. Aunt could not answer if the patient has applied for disability or medicaid prior to hospitalization but states that patient has been totally disable for at least a year now. CM has ended conversation with the aunt and explained that TOC will follow protocol for attempting to place patient but there is no guarantee that this will happen. Patient has been updated.   1000 Message has been sent to Fabio Neighbors for financial counselor to see patient for medicaid screening Per Enrique Sack a  financial counselor will be assigned to follow up with the patient.   Expected Discharge Plan: (P) Home/Self Care Barriers to Discharge: Continued Medical Work up   Patient Goals and CMS Choice Patient states their goals for this hospitalization and ongoing recovery are:: (P) Patient wants to get better to go home      Expected Discharge Plan and Services Expected Discharge Plan: (P) Home/Self Care   Discharge Planning Services: (P) CM Consult   Living arrangements for the past 2 months: (P) Single Family Home                                      Prior Living Arrangements/Services Living arrangements for the past 2 months: (P) Single Family Home Lives with:: (P) Parents Patient language and need for interpreter reviewed:: (P) Yes Do you feel safe going back to the place where you live?: (P) Yes      Need for Family Participation in Patient Care: (P) Yes (Comment) Care giver support system in place?: (P) Yes (comment)   Criminal Activity/Legal Involvement Pertinent to Current Situation/Hospitalization: (P) No - Comment as needed  Activities of Daily Living      Permission Sought/Granted Permission sought to share information with : (P) Family Supports Permission granted to share information with : (P) Yes, Verbal Permission Granted  Share Information with NAME: (P) [name]     Permission granted to share info w Relationship: (P) aunt     Emotional Assessment Appearance:: (P) Appears stated age            Admission diagnosis:  Leukocytosis [D72.829] Hyperglycemia [R73.9] Noncompliance with medication regimen [Z91.14] Generalized weakness [R53.1] Frequent falls [R29.6] Patient Active Problem List   Diagnosis Date Noted  . Generalized weakness 01/11/2021  . Acute metabolic encephalopathy 10/22/2020  . Hypotension 10/22/2020  . Leukocytosis 10/22/2020  . Type 2 diabetes mellitus (HCC) 10/22/2020  . Altered mental status   . Hyperosmolar  hyperglycemic state (HHS) (HCC)   . Hyperglycemia   . Hyperkalemia   . AKI (acute kidney injury) (HCC)   . Essential hypertension   . DKA (diabetic ketoacidosis) (HCC) 09/06/2020  . COVID-19 09/06/2020  . Bipolar 1 disorder (HCC) 09/06/2020  . Breast mass status post excision 09/06/2020  . Smoker 09/06/2020  . Metabolic acidosis due to ingestion of drugs or chemicals 09/06/2020   PCP:  [name], FNP Pharmacy:   [facility] [address] [place] [address] Phone: [phone] Fax: [phone]  [facility] [address] [place] - [address] [place] [address] Phone: [phone] Fax: [phone]     Social Determinants of Health (SDOH) Interventions    Readmission Risk Interventions Readmission Risk Prevention Plan 01/14/2021  Transportation Screening Complete  PCP or Specialist Appt within 3-5 Days (No Data)  Social Work Consult for Recovery Care Planning/Counseling Complete  Palliative Care Screening Not Applicable  Medication Review (RN Care Manager) Referral to Pharmacy  Some recent data might be hidden

## 2021-01-14 NOTE — Progress Notes (Signed)
PROGRESS NOTE                                                                                                                                                                                                             Patient Demographics:    Carolyn Norton, is a 55 y.o. female, DOB - 1966-06-30, ZOX:[id number]  Outpatient Primary MD for the patient is Dartha LodgeSteele, Anthony, FNP    LOS - 2  Admit date - 01/11/2021    Chief Complaint  Patient presents with  . Generalized Weakness  . Multiple Falls  . Medication Noncompliance  . Altered Mental Status  . Hyperglycemia       Brief Narrative (HPI from H&P)  -  Carolyn Norton is a 55 y.o. female with medical history significant for DM2, bipolar disorder type 1, previous covid 19 infection, polysubstance abuse including cocaine, who presented from home where she lives with her parents due to generalized weakness, multiple falls and confusion, I called her mother and was informed that patient has been in very poor health with inability to walk without falling over for at least a month, she did her last dose of crack cocaine about a month ago and ever since then she has been not doing well, poor balance multiple falls, not taking her medications, sugars running high.  In the ER she was diagnosed with severe dehydration, elevated sugars, metabolic encephalopathy and she was admitted to the hospital.   Subjective:   Patient in bed, appears comfortable, denies any headache, no fever, no chest pain or pressure, no shortness of breath , no abdominal pain. No focal weakness.   Assessment  & Plan :     1. Acute metabolic encephalopathy due to combination of severe dehydration, poorly controlled type 2 diabetes mellitus with hyperglycemia - she will be treated supportively with Lantus and sliding scale, hydrate with IV fluids, CT head is nonacute, she does not have any focal deficits, much  improved after supportive care continue to monitor, being seen by PT, chronically deconditioned and may require SNF placement, medically now stable will look for a bed.  2.  History of polysubstance abuse including crack cocaine abuse.  Counseled to quit.  3.  History of bipolar disorder.  Resume home dose Neurontin will monitor.  No acute issues currently  4.  COPD.  Supportive  care no acute issues.  5.  AKI on CKD 3A.  Baseline creatinine around 1.3.  AKI resolved after hydration with IV fluid.  6. GERD - PPI  7.  Hypokalemia and hypomagnesemia.  Replaced again we will continue to monitor.  8.  Dyslipidemia.  On home dose statin.  9. DM type II with hyperglycemia.  A1c of 11.6 suggesting poor glycemic control outpatient due to hyperglycemia.  I think compliance with medication is the main issue, on Lantus and sliding scale dose adjusted on 01/14/2021 .  We will also involve diabetic educator.   Lab Results  Component Value Date   HGBA1C 11.0 (H) 01/12/2021   CBG (last 3)  Recent Labs    01/13/21 2046 01/14/21 0810 01/14/21 0901  GLUCAP 171* 41* 86        Condition -  Guarded  Family Communication  :  Mother [name] [phone] - 01/12/21  Code Status : DNR  Consults  :    PUD Prophylaxis : PPI   Procedures  :     CT Head - Non Acute      Disposition Plan  :     Dispo:  Patient From: Home  Planned Disposition: Home  Medically stable for discharge: No     DVT Prophylaxis  :    enoxaparin (LOVENOX) injection 40 mg Start: 01/12/21 1000     Lab Results  Component Value Date   PLT PLATELET CLUMPS NOTED ON SMEAR, UNABLE TO ESTIMATE 01/14/2021    Diet :  Diet Order            Diet heart healthy/carb modified Room service appropriate? Yes; Fluid consistency: Thin  Diet effective now                  Inpatient Medications  Scheduled Meds: . aspirin  81 mg Oral Daily  . atorvastatin  20 mg Oral Daily  . enoxaparin (LOVENOX) injection  40 mg  Subcutaneous Daily  . escitalopram  20 mg Oral Daily  . gabapentin  300 mg Oral BID  . insulin aspart  0-15 Units Subcutaneous TID WC  . insulin aspart  0-5 Units Subcutaneous QHS  . insulin glargine  25 Units Subcutaneous Daily  . montelukast  10 mg Oral QHS  . pantoprazole  40 mg Oral Daily   Continuous Infusions:  PRN Meds:.acetaminophen, albuterol, dextrose, melatonin, ondansetron (ZOFRAN) IV  Antibiotics  :    Anti-infectives (From admission, onward)   None       Time Spent in minutes  30   Susa Raring M.D on 01/14/2021 at 9:49 AM  To page go to [email or web address]   [facility] -  Office  [phone]    See all Orders from today for further details    Objective:   Vitals:   01/12/21 1329 01/12/21 1925 01/13/21 1201 01/13/21 2047  BP: (!) 143/93 131/67 (!) 152/87 133/87  Pulse: 71 62 69 (!) 56  Resp: 16 18 15 18   Temp: 98 F (36.7 C) 97.7 F (36.5 C) 98.6 F (37 C) 98.2 F (36.8 C)  TempSrc: Oral Oral    SpO2: 97% 97% 97% 97%  Weight:      Height:        Wt Readings from Last 3 Encounters:  01/11/21 99.8 kg  11/21/20 85.5 kg  10/22/20 85.5 kg     Intake/Output Summary (Last 24 hours) at 01/14/2021 0949 Last data filed at 01/13/2021 1723 Gross per 24 hour  Intake 76.81 ml  Output --  Net 76.81 ml     Physical Exam  Awake Alert, No new F.N deficits, Normal affect Y[place]ell.AT,[name] Supple Neck,No JVD, No cervical lymphadenopathy appriciated.  Symmetrical Chest wall movement, Good air movement bilaterally, CTAB RRR,No Gallops, Rubs or new Murmurs, No Parasternal Heave +ve B.Sounds, Abd Soft, No tenderness, No organomegaly appriciated, No rebound - guarding or rigidity. No Cyanosis, Clubbing or edema, No new Rash or bruise     Data Review:    CBC Recent Labs  Lab 01/11/21 1227 01/11/21 1236 01/12/21 0232 01/13/21 0349 01/14/21 0238  WBC 14.1*  --  9.2 5.8 5.2  HGB 13.5 14.6 12.6 12.2 11.9*  HCT 41.6 43.0 39.1 36.2 36.2  PLT 175   --  137* 129* PLATELET CLUMPS NOTED ON SMEAR, UNABLE TO ESTIMATE  MCV 87.9  --  88.9 86.0 86.8  MCH 28.5  --  28.6 29.0 28.5  MCHC 32.5  --  32.2 33.7 32.9  RDW 13.5  --  13.9 13.9 14.2  LYMPHSABS 1.6  --   --  2.5 2.3  MONOABS 1.0  --   --  0.7 0.8  EOSABS 0.0  --   --  0.1 0.1  BASOSABS 0.0  --   --  0.0 0.0    Recent Labs  Lab 01/11/21 1227 01/11/21 1236 01/12/21 0232 01/12/21 1403 01/13/21 0349 01/14/21 0238  NA 127* 128* 136  --  136 138  K 4.7 4.7 5.3*  --  2.7* 3.4*  CL 88*  --  102  --  98 102  CO2 27  --  22  --  32 32  GLUCOSE 729*  --  249*  --  162* 137*  BUN 21*  --  20  --  12 8  CREATININE 1.59*  --  1.21*  --  1.06* 1.02*  CALCIUM 8.7*  --  8.1*  --  8.2* 8.2*  AST 11*  --   --   --  14* 13*  ALT 8  --   --   --  9 10  ALKPHOS 85  --   --   --  35* 42  BILITOT 0.9  --   --   --  0.2* 0.2*  ALBUMIN 3.0*  --   --   --  2.2* 2.3*  MG  --   --  1.9  --  1.5* 1.9  PROCALCITON  --   --   --  0.15 0.11 <0.10  HGBA1C  --   --  11.0*  --   --   --   BNP  --   --   --   --  219.6* 177.8*    ------------------------------------------------------------------------------------------------------------------ No results for input(s): CHOL, HDL, LDLCALC, TRIG, CHOLHDL, LDLDIRECT in the last 72 hours.  Lab Results  Component Value Date   HGBA1C 11.0 (H) 01/12/2021   ------------------------------------------------------------------------------------------------------------------ No results for input(s): TSH, T4TOTAL, T3FREE, THYROIDAB in the last 72 hours.  Invalid input(s): FREET3  Cardiac Enzymes No results for input(s): CKMB, TROPONINI, MYOGLOBIN in the last 168 hours.  Invalid input(s): CK ------------------------------------------------------------------------------------------------------------------    Component Value Date/Time   BNP 177.8 (H) 01/14/2021 0238    Micro Results Recent Results (from the past 240 hour(s))  Resp Panel by RT-PCR (Flu A&B,  Covid) Nasopharyngeal Swab     Status: None   Collection Time: 01/11/21 12:35 PM   Specimen: Nasopharyngeal Swab; Nasopharyngeal(NP) swabs in vial transport medium  Result Value Ref Range Status  SARS Coronavirus 2 by RT PCR NEGATIVE NEGATIVE Final    Comment: (NOTE) SARS-CoV-2 target nucleic acids are NOT DETECTED.  The SARS-CoV-2 RNA is generally detectable in upper respiratory specimens during the acute phase of infection. The lowest concentration of SARS-CoV-2 viral copies this assay can detect is 138 copies/mL. A negative result does not preclude SARS-Cov-2 infection and should not be used as the sole basis for treatment or other patient management decisions. A negative result may occur with  improper specimen collection/handling, submission of specimen other than nasopharyngeal swab, presence of viral mutation(s) within the areas targeted by this assay, and inadequate number of viral copies(<138 copies/mL). A negative result must be combined with clinical observations, patient history, and epidemiological information. The expected result is Negative.  Fact Sheet for Patients:  BloggerCourse.com  Fact Sheet for Healthcare Providers:  SeriousBroker.it  This test is no t yet approved or cleared by the [place] FDA and  has been authorized for detection and/or diagnosis of SARS-CoV-2 by FDA under an Emergency Use Authorization (EUA). This EUA will remain  in effect (meaning this test can be used) for the duration of the COVID-19 declaration under Section 564(b)(1) of the Act, 21 U.S.C.section 360bbb-3(b)(1), unless the authorization is terminated  or revoked sooner.       Influenza A by PCR NEGATIVE NEGATIVE Final   Influenza B by PCR NEGATIVE NEGATIVE Final    Comment: (NOTE) The Xpert Xpress SARS-CoV-2/FLU/RSV plus assay is intended as an aid in the diagnosis of influenza from Nasopharyngeal swab specimens and should  not be used as a sole basis for treatment. Nasal washings and aspirates are unacceptable for Xpert Xpress SARS-CoV-2/FLU/RSV testing.  Fact Sheet for Patients: BloggerCourse.com  Fact Sheet for Healthcare Providers: SeriousBroker.it  This test is not yet approved or cleared by the [place] FDA and has been authorized for detection and/or diagnosis of SARS-CoV-2 by FDA under an Emergency Use Authorization (EUA). This EUA will remain in effect (meaning this test can be used) for the duration of the COVID-19 declaration under Section 564(b)(1) of the Act, 21 U.S.C. section 360bbb-3(b)(1), unless the authorization is terminated or revoked.  Performed at [facility], [address], [place], [place] [address]     Radiology Reports CT Head Wo Contrast  Result Date: 01/11/2021 CLINICAL DATA:  Mental status change.  Cause unknown. EXAM: CT HEAD WITHOUT CONTRAST TECHNIQUE: Contiguous axial images were obtained from the base of the skull through the vertex without intravenous contrast. COMPARISON:  CT head 10/22/2020 FINDINGS: Brain: Similar-appearing prominence of the lateral ventricles may be related to central predominant atrophy, although a component of normal pressure/communicating hydrocephalus cannot be excluded. No evidence of large-territorial acute infarction. No parenchymal hemorrhage. No mass lesion. No extra-axial collection. Patchy and confluent areas of decreased attenuation are noted throughout the deep and periventricular white matter of the cerebral hemispheres bilaterally, compatible with chronic microvascular ischemic disease. Hypodensity along the right pulse cerebral unchanged. No mass effect or midline shift. No hydrocephalus. Basilar cisterns are patent. Vascular: No hyperdense vessel. Atherosclerotic calcifications are present within the cavernous internal carotid arteries. Skull: No acute fracture or focal lesion.  Sinuses/Orbits: Paranasal sinuses and mastoid air cells are clear. The orbits are unremarkable. Other: None. IMPRESSION: 1. No acute intracranial abnormality. 2. Similar-appearing prominence of the lateral ventricles may be related to central predominant atrophy, although a component of normal pressure/communicating hydrocephalus cannot be excluded. Electronically Signed   By: Tish Frederickson M.D.   On: 01/11/2021 23:46  DG CHEST PORT 1 VIEW  Result Date: 01/11/2021 CLINICAL DATA:  Weakness EXAM: PORTABLE CHEST 1 VIEW COMPARISON:  10/22/2020 FINDINGS: Cardiac shadow is enlarged but accentuated by the portable technique. Aortic calcifications are noted. The lungs are clear. No bony abnormality is noted. IMPRESSION: No acute abnormality noted. Electronically Signed   By: Alcide Clever M.D.   On: 01/11/2021 23:42

## 2021-01-14 NOTE — Progress Notes (Signed)
Physical Therapy Treatment Patient Details Name: Carolyn Norton MRN: [id number] DOB: April 28, 1966 Today's Date: 01/14/2021    History of Present Illness Pt presents from home with multiple falls, confusion, and weakness. In ED blood glucose was 729 and pt was unable to ambulate.  Patient admitted with severe dehydration and metabolic encephalopathy. PMH: DM2, COPD, CKD3, GERD, bipolar disorder type 1, previous covid 19 infection, polysubstance abuse including cocaine.    PT Comments    Pt admitted with above diagnosis. Pt with large BM on arrival.  Assisted NT with cleaning pt and then stood pt at [facility] hwoever pt could not progress to ambulatioon as she needed mod assist to stand with RW with heavy posterior lean.  Unable to take steps safely. Agree with SNF for therapy and updated pts frequency to 2 x week as pt is for SNF.   Pt currently with functional limitations due to balance and endurance deficits. Pt will benefit from skilled PT to increase their independence and safety with mobility to allow discharge to the venue listed below.     Follow Up Recommendations  SNF;Supervision/Assistance - 24 hour     Equipment Recommendations  Rolling walker with 5" wheels;3in1 (PT)    Recommendations for Other Services OT consult     Precautions / Restrictions Precautions Precautions: Fall Restrictions Weight Bearing Restrictions: No    Mobility  Bed Mobility Overal bed mobility: Needs Assistance Bed Mobility: Supine to Sit Rolling: Min guard Sidelying to sit: Min guard       General bed mobility comments: On arrival, noted pt with large BM.  Called NT who came in and assist PT to clean pt.  Pt able to come to eOB without assist with cues only.    Transfers Overall transfer level: Needs assistance Equipment used: Rolling walker (2 wheeled) Transfers: Sit to/from [employer] Sit to Stand: Min assist Stand pivot transfers: Mod assist;+2 safety/equipment        General transfer comment: Pt needed minassist to power up.  Needed cues for hand placement and sequencing. Once up, continued making sure pt was clean from BM. Needed mod assist to maintain standing and for pivot to recliner as pt with heavy posterior lean.  Needed assist to move RW and cues to stay with RW.  Ambulation/Gait             General Gait Details: unable due to heavy posterior lean when standing   Stairs             Wheelchair Mobility    Modified Rankin (Stroke Patients Only)       Balance Overall balance assessment: Needs assistance;History of Falls Sitting-balance support: No upper extremity supported;Feet supported Sitting balance-Leahy Scale: Fair Sitting balance - Comments: Supervision A for safety. Postural control: Posterior lean Standing balance support: Bilateral upper extremity supported;During functional activity Standing balance-Leahy Scale: Poor Standing balance comment: Requires external assist to maintain static standing balance up to mod assist due to posterior lean                            Cognition Arousal/Alertness: Awake/alert Behavior During Therapy: Flat affect Overall Cognitive Status: No family/caregiver present to determine baseline cognitive functioning                                 General Comments: Patient with difficulty answering open ended questions. Can answer yes/no  questions. Was oriented to place, time and situation.      Exercises General Exercises - Lower Extremity Ankle Circles/Pumps: AROM;Both;10 reps;Seated Long Arc Quad: AROM;Both;10 reps;Seated    General Comments General comments (skin integrity, edema, etc.): VSS on RA      Pertinent Vitals/Pain Pain Assessment: No/denies pain    Home Living                      Prior Function            PT Goals (current goals can now be found in the care plan section) Acute Rehab PT Goals Patient Stated Goal: No goals  stated. Progress towards PT goals: Progressing toward goals    Frequency    Min 2X/week      PT Plan Current plan remains appropriate;Frequency needs to be updated    Co-evaluation              AM-PAC PT "6 Clicks" Mobility   Outcome Measure  Help needed turning from your back to your side while in a flat bed without using bedrails?: None Help needed moving from lying on your back to sitting on the side of a flat bed without using bedrails?: A Little Help needed moving to and from a bed to a chair (including a wheelchair)?: A Lot Help needed standing up from a chair using your arms (e.g., wheelchair or bedside chair)?: A Lot Help needed to walk in hospital room?: Total Help needed climbing 3-5 steps with a railing? : Total 6 Click Score: 13    End of Session Equipment Utilized During Treatment: Gait belt Activity Tolerance: Patient limited by fatigue Patient left: with call bell/phone within reach;in chair;with chair alarm set Nurse Communication: Mobility status PT Visit Diagnosis: Muscle weakness (generalized) (M62.81);Repeated falls (R29.6);Difficulty in walking, not elsewhere classified (R26.2);Unsteadiness on feet (R26.81)     Time: 1020-1041 PT Time Calculation (min) (ACUTE ONLY): 21 min  Charges:  $Therapeutic Activity: 8-22 mins                     Chiron Campione M,PT Acute Rehab Services [phone] (pager)   Bevelyn Buckles 01/14/2021, 12:23 PM

## 2021-01-15 LAB — CBC WITH DIFFERENTIAL/PLATELET
Abs Immature Granulocytes: 0.01 10*3/uL (ref 0.00–0.07)
Basophils Absolute: 0 10*3/uL (ref 0.0–0.1)
Basophils Relative: 0 %
Eosinophils Absolute: 0.1 10*3/uL (ref 0.0–0.5)
Eosinophils Relative: 2 %
HCT: 40.3 % (ref 36.0–46.0)
Hemoglobin: 13.2 g/dL (ref 12.0–15.0)
Immature Granulocytes: 0 %
Lymphocytes Relative: 46 %
Lymphs Abs: 2.8 10*3/uL (ref 0.7–4.0)
MCH: 28.6 pg (ref 26.0–34.0)
MCHC: 32.8 g/dL (ref 30.0–36.0)
MCV: 87.4 fL (ref 80.0–100.0)
Monocytes Absolute: 1 10*3/uL (ref 0.1–1.0)
Monocytes Relative: 16 %
Neutro Abs: 2.2 10*3/uL (ref 1.7–7.7)
Neutrophils Relative %: 36 %
Platelets: 113 10*3/uL — ABNORMAL LOW (ref 150–400)
RBC: 4.61 MIL/uL (ref 3.87–5.11)
RDW: 14.4 % (ref 11.5–15.5)
WBC: 6.1 10*3/uL (ref 4.0–10.5)
nRBC: 0 % (ref 0.0–0.2)

## 2021-01-15 LAB — GLUCOSE, CAPILLARY
Glucose-Capillary: 23 mg/dL — CL (ref 70–99)
Glucose-Capillary: 51 mg/dL — ABNORMAL LOW (ref 70–99)
Glucose-Capillary: 96 mg/dL (ref 70–99)
Glucose-Capillary: 196 mg/dL — ABNORMAL HIGH (ref 70–99)
Glucose-Capillary: 291 mg/dL — ABNORMAL HIGH (ref 70–99)
Glucose-Capillary: 156 mg/dL — ABNORMAL HIGH (ref 70–99)

## 2021-01-15 LAB — COMPREHENSIVE METABOLIC PANEL
ALT: 10 U/L (ref 0–44)
AST: 14 U/L — ABNORMAL LOW (ref 15–41)
Albumin: 2.6 g/dL — ABNORMAL LOW (ref 3.5–5.0)
Alkaline Phosphatase: 44 U/L (ref 38–126)
Anion gap: 6 (ref 5–15)
BUN: 7 mg/dL (ref 6–20)
CO2: 32 mmol/L (ref 22–32)
Calcium: 9.1 mg/dL (ref 8.9–10.3)
Chloride: 104 mmol/L (ref 98–111)
Creatinine, Ser: 1.06 mg/dL — ABNORMAL HIGH (ref 0.44–1.00)
GFR, Estimated: >60 mL/min (ref >60)
Glucose, Bld: 32 mg/dL — CL (ref 70–99)
Potassium: 4 mmol/L (ref 3.5–5.1)
Sodium: 142 mmol/L (ref 135–145)
Total Bilirubin: 0.4 mg/dL (ref 0.3–1.2)
Total Protein: 5.8 g/dL — ABNORMAL LOW (ref 6.5–8.1)

## 2021-01-15 LAB — BRAIN NATRIURETIC PEPTIDE: B Natriuretic Peptide: 115.9 pg/mL — ABNORMAL HIGH (ref 0.0–100.0)

## 2021-01-15 LAB — MAGNESIUM: Magnesium: 1.8 mg/dL (ref 1.7–2.4)

## 2021-01-15 MED ORDER — INSULIN GLARGINE 100 UNIT/ML ~~LOC~~ SOLN
20.0000 [IU] | Freq: Every day | SUBCUTANEOUS | Status: DC
  Administered 2021-01-15 – 2021-01-17 (×3): 20 [IU] via SUBCUTANEOUS
  Filled 2021-01-15 (×4): qty 0.2

## 2021-01-15 MED ORDER — DIVALPROEX SODIUM 500 MG PO DR TAB
500.0000 mg | DELAYED_RELEASE_TABLET | Freq: Two times a day (BID) | ORAL | Status: DC
  Administered 2021-01-15 – 2021-03-04 (×98): 500 mg via ORAL
  Filled 2021-01-15 (×100): qty 1

## 2021-01-15 MED ORDER — INSULIN ASPART 100 UNIT/ML IJ SOLN
0.0000 [IU] | Freq: Three times a day (TID) | INTRAMUSCULAR | Status: DC
  Administered 2021-01-15 (×2): 5 [IU] via SUBCUTANEOUS
  Administered 2021-01-16 (×2): 1 [IU] via SUBCUTANEOUS
  Administered 2021-01-16: 3 [IU] via SUBCUTANEOUS
  Administered 2021-01-17: 2 [IU] via SUBCUTANEOUS

## 2021-01-15 NOTE — Progress Notes (Signed)
PROGRESS NOTE                                                                                                                                                                                                             Patient Demographics:    Carolyn Norton, is a 55 y.o. female, DOB - [birth year], MWN:[id number]  Outpatient Primary MD for the patient is [name], FNP    LOS - 3  Admit date - 01/11/2021    Chief Complaint  Patient presents with  . Generalized Weakness  . Multiple Falls  . Medication Noncompliance  . Altered Mental Status  . Hyperglycemia       Brief Narrative (HPI from H&P)  -  Carolyn Norton is a 55 y.o. female with medical history significant for DM2, bipolar disorder type 1, previous covid 19 infection, polysubstance abuse including cocaine, who presented from home where she lives with her parents due to generalized weakness, multiple falls and confusion, I called her mother and was informed that patient has been in very poor health with inability to walk without falling over for at least a month, she did her last dose of crack cocaine about a month ago and ever since then she has been not doing well, poor balance multiple falls, not taking her medications, sugars running high.  In the ER she was diagnosed with severe dehydration, elevated sugars, metabolic encephalopathy and she was admitted to the hospital.   Subjective:   Patient in bed, appears comfortable, denies any headache, no fever, no chest pain or pressure, no shortness of breath , no abdominal pain. No new focal weakness.   Assessment  & Plan :     1. Acute metabolic encephalopathy due to combination of severe dehydration, poorly controlled type 2 diabetes mellitus with hyperglycemia - she will be treated supportively with Lantus and sliding scale, hydrate with IV fluids, CT head is nonacute, she does not have any focal deficits,  much improved after supportive care continue to monitor, being seen by PT, chronically deconditioned and may require SNF placement vs HHPT, medically much improved, likely DC 01/16/21.  2.  History of polysubstance abuse including crack cocaine abuse.  Counseled to quit.  3.  History of bipolar disorder.  Resume home dose Neurontin will monitor.  No acute issues currently  4.  COPD.  Supportive care no acute issues.  5.  AKI on CKD 3A.  Baseline creatinine around 1.3.  AKI resolved after hydration with IV fluid.  6. GERD - PPI  7.  Hypokalemia and hypomagnesemia.  Replaced again we will continue to monitor.  8.  Dyslipidemia.  On home dose statin.  9. DM type II with hyperglycemia.  A1c of 11.6 suggesting poor glycemic control outpatient due to hyperglycemia.  I think compliance with medications is the main issue, on Lantus and sliding scale dose adjusted on 01/15/2021 .  We will also involve diabetic educator.   Lab Results  Component Value Date   HGBA1C 11.0 (H) 01/12/2021   CBG (last 3)  Recent Labs    01/15/21 0432 01/15/21 0501 01/15/21 0736  GLUCAP 51* 96 196*        Condition -  Guarded  Family Communication  :  Mother [name] [phone] - 01/12/21  Code Status : DNR  Consults  :    PUD Prophylaxis : PPI   Procedures  :     CT Head - Non Acute      Disposition Plan  :     Dispo:  Patient From: Home  Planned Disposition: Home  Medically stable for discharge: No     DVT Prophylaxis  :    enoxaparin (LOVENOX) injection 40 mg Start: 01/12/21 1000     Lab Results  Component Value Date   PLT 113 (L) 01/15/2021    Diet :  Diet Order            Diet heart healthy/carb modified Room service appropriate? Yes; Fluid consistency: Thin  Diet effective now                  Inpatient Medications  Scheduled Meds: . aspirin  81 mg Oral Daily  . atorvastatin  20 mg Oral Daily  . enoxaparin (LOVENOX) injection  40 mg Subcutaneous Daily  .  escitalopram  20 mg Oral Daily  . gabapentin  300 mg Oral BID  . insulin aspart  0-9 Units Subcutaneous TID WC  . insulin glargine  20 Units Subcutaneous Daily  . montelukast  10 mg Oral QHS  . pantoprazole  40 mg Oral Daily   Continuous Infusions:  PRN Meds:.acetaminophen, albuterol, dextrose, melatonin, ondansetron (ZOFRAN) IV  Antibiotics  :    Anti-infectives (From admission, onward)   None       Time Spent in minutes  30   Susa Raring M.D on 01/15/2021 at 10:40 AM  To page go to [email or web address]   [facility] -  Office  [phone]    See all Orders from today for further details    Objective:   Vitals:   01/13/21 2047 01/14/21 1227 01/14/21 2021 01/15/21 0509  BP: 133/87 (!) 127/116 127/63 113/75  Pulse: (!) 56 67 64 68  Resp: 18 18 20 18   Temp: 98.2 F (36.8 C) 98 F (36.7 C) 97.7 F (36.5 C) 97.9 F (36.6 C)  TempSrc:   Oral Oral  SpO2: 97% 96% 96% 95%  Weight:      Height:        Wt Readings from Last 3 Encounters:  01/11/21 99.8 kg  11/21/20 85.5 kg  10/22/20 85.5 kg     Intake/Output Summary (Last 24 hours) at 01/15/2021 1040 Last data filed at 01/15/2021 0321 Gross per 24 hour  Intake 240 ml  Output 1800 ml  Net -1560 ml     Physical Exam  Awake  Alert, No new F.N deficits, Normal affect .AT,[name] Supple Neck,No JVD, No cervical lymphadenopathy appriciated.  Symmetrical Chest wall movement, Good air movement bilaterally, CTAB RRR,No Gallops, Rubs or new Murmurs, No Parasternal Heave +ve B.Sounds, Abd Soft, No tenderness, No organomegaly appriciated, No rebound - guarding or rigidity. No Cyanosis, Clubbing or edema, No new Rash or bruise    Data Review:    CBC Recent Labs  Lab 01/11/21 1227 01/11/21 1236 01/12/21 0232 01/13/21 0349 01/14/21 0238 01/15/21 0230  WBC 14.1*  --  9.2 5.8 5.2 6.1  HGB 13.5 14.6 12.6 12.2 11.9* 13.2  HCT 41.6 43.0 39.1 36.2 36.2 40.3  PLT 175  --  137* 129* PLATELET CLUMPS NOTED ON  SMEAR, UNABLE TO ESTIMATE 113*  MCV 87.9  --  88.9 86.0 86.8 87.4  MCH 28.5  --  28.6 29.0 28.5 28.6  MCHC 32.5  --  32.2 33.7 32.9 32.8  RDW 13.5  --  13.9 13.9 14.2 14.4  LYMPHSABS 1.6  --   --  2.5 2.3 2.8  MONOABS 1.0  --   --  0.7 0.8 1.0  EOSABS 0.0  --   --  0.1 0.1 0.1  BASOSABS 0.0  --   --  0.0 0.0 0.0    Recent Labs  Lab 01/11/21 1227 01/11/21 1236 01/12/21 0232 01/12/21 1403 01/13/21 0349 01/14/21 0238 01/15/21 0230  NA 127* 128* 136  --  136 138 142  K 4.7 4.7 5.3*  --  2.7* 3.4* 4.0  CL 88*  --  102  --  98 102 104  CO2 27  --  22  --  32 32 32  GLUCOSE 729*  --  249*  --  162* 137* 32*  BUN 21*  --  20  --  CREATININE 1.59*  --  1.21*  --  1.06* 1.02* 1.06*  CALCIUM 8.7*  --  8.1*  --  8.2* 8.2* 9.1  AST 11*  --   --   --  14* 13* 14*  ALT 8  --   --   --  ALKPHOS 85  --   --   --  35* 42 44  BILITOT 0.9  --   --   --  0.2* 0.2* 0.4  ALBUMIN 3.0*  --   --   --  2.2* 2.3* 2.6*  MG  --   --  1.9  --  1.5* 1.9 1.8  PROCALCITON  --   --   --  0.15 0.11 <0.10  --   HGBA1C  --   --  11.0*  --   --   --   --   BNP  --   --   --   --  219.6* 177.8* 115.9*    ------------------------------------------------------------------------------------------------------------------ No results for input(s): CHOL, HDL, LDLCALC, TRIG, CHOLHDL, LDLDIRECT in the last 72 hours.  Lab Results  Component Value Date   HGBA1C 11.0 (H) 01/12/2021   ------------------------------------------------------------------------------------------------------------------ No results for input(s): TSH, T4TOTAL, T3FREE, THYROIDAB in the last 72 hours.  Invalid input(s): FREET3  Cardiac Enzymes No results for input(s): CKMB, TROPONINI, MYOGLOBIN in the last 168 hours.  Invalid input(s): CK ------------------------------------------------------------------------------------------------------------------    Component Value Date/Time   BNP 115.9 (H) 01/15/2021 0230     Micro Results Recent Results (from the past 240 hour(s))  Resp Panel by RT-PCR (Flu A&B, Covid) Nasopharyngeal Swab     Status: None   Collection Time:  01/11/21 12:35 PM   Specimen: Nasopharyngeal Swab; Nasopharyngeal(NP) swabs in vial transport medium  Result Value Ref Range Status   SARS Coronavirus 2 by RT PCR NEGATIVE NEGATIVE Final    Comment: (NOTE) SARS-CoV-2 target nucleic acids are NOT DETECTED.  The SARS-CoV-2 RNA is generally detectable in upper respiratory specimens during the acute phase of infection. The lowest concentration of SARS-CoV-2 viral copies this assay can detect is 138 copies/mL. A negative result does not preclude SARS-Cov-2 infection and should not be used as the sole basis for treatment or other patient management decisions. A negative result may occur with  improper specimen collection/handling, submission of specimen other than nasopharyngeal swab, presence of viral mutation(s) within the areas targeted by this assay, and inadequate number of viral copies(<138 copies/mL). A negative result must be combined with clinical observations, patient history, and epidemiological information. The expected result is Negative.  Fact Sheet for Patients:  BloggerCourse.com  Fact Sheet for Healthcare Providers:  SeriousBroker.it  This test is no t yet approved or cleared by the [place] FDA and  has been authorized for detection and/or diagnosis of SARS-CoV-2 by FDA under an Emergency Use Authorization (EUA). This EUA will remain  in effect (meaning this test can be used) for the duration of the COVID-19 declaration under Section 564(b)(1) of the Act, 21 U.S.C.section 360bbb-3(b)(1), unless the authorization is terminated  or revoked sooner.       Influenza A by PCR NEGATIVE NEGATIVE Final   Influenza B by PCR NEGATIVE NEGATIVE Final    Comment: (NOTE) The Xpert Xpress SARS-CoV-2/FLU/RSV plus assay is  intended as an aid in the diagnosis of influenza from Nasopharyngeal swab specimens and should not be used as a sole basis for treatment. Nasal washings and aspirates are unacceptable for Xpert Xpress SARS-CoV-2/FLU/RSV testing.  Fact Sheet for Patients: BloggerCourse.com  Fact Sheet for Healthcare Providers: SeriousBroker.it  This test is not yet approved or cleared by the [place] FDA and has been authorized for detection and/or diagnosis of SARS-CoV-2 by FDA under an Emergency Use Authorization (EUA). This EUA will remain in effect (meaning this test can be used) for the duration of the COVID-19 declaration under Section 564(b)(1) of the Act, 21 U.S.C. section 360bbb-3(b)(1), unless the authorization is terminated or revoked.  Performed at [facility], [address] [place], [place] [address]     Radiology Reports CT Head Wo Contrast  Result Date: 01/11/2021 CLINICAL DATA:  Mental status change.  Cause unknown. EXAM: CT HEAD WITHOUT CONTRAST TECHNIQUE: Contiguous axial images were obtained from the base of the skull through the vertex without intravenous contrast. COMPARISON:  CT head 10/22/2020 FINDINGS: Brain: Similar-appearing prominence of the lateral ventricles may be related to central predominant atrophy, although a component of normal pressure/communicating hydrocephalus cannot be excluded. No evidence of large-territorial acute infarction. No parenchymal hemorrhage. No mass lesion. No extra-axial collection. Patchy and confluent areas of decreased attenuation are noted throughout the deep and periventricular white matter of the cerebral hemispheres bilaterally, compatible with chronic microvascular ischemic disease. Hypodensity along the right pulse cerebral unchanged. No mass effect or midline shift. No hydrocephalus. Basilar cisterns are patent. Vascular: No hyperdense vessel. Atherosclerotic calcifications are  present within the cavernous internal carotid arteries. Skull: No acute fracture or focal lesion. Sinuses/Orbits: Paranasal sinuses and mastoid air cells are clear. The orbits are unremarkable. Other: None. IMPRESSION: 1. No acute intracranial abnormality. 2. Similar-appearing prominence of the lateral ventricles may be related to central predominant atrophy, although a  component of normal pressure/communicating hydrocephalus cannot be excluded. Electronically Signed   By: Tish FredericksonMorgane  Naveau M.D.   On: 01/11/2021 23:46   DG CHEST PORT 1 VIEW  Result Date: 01/11/2021 CLINICAL DATA:  Weakness EXAM: PORTABLE CHEST 1 VIEW COMPARISON:  10/22/2020 FINDINGS: Cardiac shadow is enlarged but accentuated by the portable technique. Aortic calcifications are noted. The lungs are clear. No bony abnormality is noted. IMPRESSION: No acute abnormality noted. Electronically Signed   By: Alcide CleverMark  Lukens M.D.   On: 01/11/2021 23:42

## 2021-01-16 LAB — CBC WITH DIFFERENTIAL/PLATELET
Abs Immature Granulocytes: 0.02 10*3/uL (ref 0.00–0.07)
Basophils Absolute: 0.1 10*3/uL (ref 0.0–0.1)
Basophils Relative: 1 %
Eosinophils Absolute: 0.2 10*3/uL (ref 0.0–0.5)
Eosinophils Relative: 2 %
HCT: 37.3 % (ref 36.0–46.0)
Hemoglobin: 12.2 g/dL (ref 12.0–15.0)
Immature Granulocytes: 0 %
Lymphocytes Relative: 42 %
Lymphs Abs: 3.8 10*3/uL (ref 0.7–4.0)
MCH: 28.4 pg (ref 26.0–34.0)
MCHC: 32.7 g/dL (ref 30.0–36.0)
MCV: 86.9 fL (ref 80.0–100.0)
Monocytes Absolute: 1.1 10*3/uL — ABNORMAL HIGH (ref 0.1–1.0)
Monocytes Relative: 12 %
Neutro Abs: 3.9 10*3/uL (ref 1.7–7.7)
Neutrophils Relative %: 43 %
Platelets: 114 10*3/uL — ABNORMAL LOW (ref 150–400)
RBC: 4.29 MIL/uL (ref 3.87–5.11)
RDW: 14.7 % (ref 11.5–15.5)
WBC: 9.1 10*3/uL (ref 4.0–10.5)
nRBC: 0 % (ref 0.0–0.2)

## 2021-01-16 LAB — COMPREHENSIVE METABOLIC PANEL
ALT: 11 U/L (ref 0–44)
AST: 20 U/L (ref 15–41)
Albumin: 2.5 g/dL — ABNORMAL LOW (ref 3.5–5.0)
Alkaline Phosphatase: 40 U/L (ref 38–126)
Anion gap: 7 (ref 5–15)
BUN: 12 mg/dL (ref 6–20)
CO2: 29 mmol/L (ref 22–32)
Calcium: 9.1 mg/dL (ref 8.9–10.3)
Chloride: 100 mmol/L (ref 98–111)
Creatinine, Ser: 1.19 mg/dL — ABNORMAL HIGH (ref 0.44–1.00)
GFR, Estimated: 55 mL/min — ABNORMAL LOW (ref >60)
Glucose, Bld: 135 mg/dL — ABNORMAL HIGH (ref 70–99)
Potassium: 4.6 mmol/L (ref 3.5–5.1)
Sodium: 136 mmol/L (ref 135–145)
Total Bilirubin: 0.4 mg/dL (ref 0.3–1.2)
Total Protein: 5.7 g/dL — ABNORMAL LOW (ref 6.5–8.1)

## 2021-01-16 LAB — GLUCOSE, CAPILLARY
Glucose-Capillary: 125 mg/dL — ABNORMAL HIGH (ref 70–99)
Glucose-Capillary: 133 mg/dL — ABNORMAL HIGH (ref 70–99)
Glucose-Capillary: 201 mg/dL — ABNORMAL HIGH (ref 70–99)
Glucose-Capillary: 145 mg/dL — ABNORMAL HIGH (ref 70–99)
Glucose-Capillary: 204 mg/dL — ABNORMAL HIGH (ref 70–99)

## 2021-01-16 LAB — BRAIN NATRIURETIC PEPTIDE: B Natriuretic Peptide: 40.9 pg/mL (ref 0.0–100.0)

## 2021-01-16 LAB — MAGNESIUM: Magnesium: 1.7 mg/dL (ref 1.7–2.4)

## 2021-01-16 MED ORDER — INSULIN GLARGINE 100 UNIT/ML SOLOSTAR PEN: 20.0000 [IU] | PEN_INJECTOR | Freq: Every day | SUBCUTANEOUS | 2 refills | Status: DC

## 2021-01-16 MED ORDER — INSULIN ASPART 100 UNIT/ML FLEXPEN: PEN_INJECTOR | SUBCUTANEOUS | 0 refills | Status: DC

## 2021-01-16 NOTE — Discharge Summary (Addendum)
Carolyn Norton IHK:[id number] DOB: 1965-12-28 DOA: 01/11/2021  PCP: Elbert Ewings, FNP  Admit date: 01/11/2021  Discharge date: 01/16/2021  Admitted From: Home     Disposition:  Home - refused SNF -   Of note patient has capacity to decide for herself, she is awake alert oriented, she told me that her PCP is Dr. Elbert Ewings, she understands that she has to be more compliant with her insulin and glycemic regimen, she further says that she buys her crack cocaine from her next-door neighbor named Marden Noble and that she should quit doing crack cocaine, she also understands that she has the option of going to an SNF but she says she does not think it is necessary that if she gets worse at home she will look into going to SNF in the future.  Explained to her Aunt in detail, patient has the capacity to make her own decision, despite our recommendations of SNF she chooses to go home.  Aunt also had questions about Medicaid qualification she was requested to discuss these with social work.  Informed social worker that she is medically cleared for discharge disposition will be up to case management and social work, per SW DC to [employer].   I called mother Carolyn Norton [phone] and she clearly informs [place] that she will not let the patient enter her house (mom's house), patient she does not have a place to go, she is willing to be placed if needed and will stay back until a safe disposition is found.  Will cancel discharge as we do not have a safe disposition at this time.  Informed SW as well.    Recommendations for Outpatient Follow-up:   Follow up with PCP in 1-2 weeks  PCP Please obtain BMP/CBC, 2 view CXR in 1week,  (see Discharge instructions)   PCP Please follow up on the following pending results: monitor CBGs  closely   Home Health: PT,RN   Equipment/Devices: as below  Consultations: DM educator Discharge Condition: Stable    CODE STATUS: Full    Diet Recommendation: Heart Healthy Low Carb  Diet Order            Diet heart healthy/carb modified Room service appropriate? Yes; Fluid consistency: Thin  Diet effective now                  Chief Complaint  Patient presents with  . Generalized Weakness  . Multiple Falls  . Medication Noncompliance  . Altered Mental Status  . Hyperglycemia     Brief history of present illness from the day of admission and additional interim summary    Carolyn Norton a 55 y.o.femalewith medical history significant forDM2, bipolar disorder type 1, previous covid 19 infection, polysubstance abuse including cocaine, who presented from home where she lives with her parents due to generalized weakness, multiple falls and confusion, I called her mother and was informed that patient has been in very poor health with inability to walk without falling over for at least a month, she  did her last dose of crack cocaine about a month ago and ever since then she has been not doing well, poor balance multiple falls, not taking her medications, sugars running high.  In the ER she was diagnosed with severe dehydration, elevated sugars, metabolic encephalopathy and she was admitted to the hospital.                                                                 Hospital Course    1. Acute metabolic encephalopathy due to combination of severe dehydration, poorly controlled type 2 diabetes mellitus with hyperglycemia - she will be treated supportively with Lantus and sliding scale, hydrated with IV fluids, CT head was nonacute, she was seen by PT OT, although she qualified for SNF she has refused placement.  Plans for home DC with HHPT and Equipment as she qualifies.  2.  History of polysubstance abuse including crack cocaine abuse.  Counseled to quit.  3.   History of bipolar disorder.  Resume home dose Neurontin will monitor.  No acute issues currently  4.  COPD.  Supportive care no acute issues.  5.  AKI on CKD 3A.  Baseline creatinine around 1.3.  AKI resolved after hydration with IV fluid.  6. GERD - PPI  7.  Hypokalemia and hypomagnesemia.  Replaced & stable.  8.  Dyslipidemia.  On home dose statin.  9. DM type II with hyperglycemia.  A1c of 11.6 suggesting poor glycemic control outpatient due to hyperglycemia.  I think compliance with medications is the main issue, was controlled with less than half her home dose Lantus along with low-dose sliding scale, diabetic education was provided, she was counseled on compliance.  Will be discharged on below mentioned Lantus and sliding scale with instructions to do Accu-Cheks q. [facility] S and show the results to PCP in a week.   Discharge diagnosis     Active Problems:   Generalized weakness    Discharge instructions    Discharge Instructions    Discharge instructions   Complete by: As directed    Follow with Primary MD Elbert Ewings, FNP in 7 days   Get CBC, CMP, 2 view Chest X ray -  checked next visit within 1 week by Primary MD   Activity: As tolerated with Full fall precautions use walker/cane & assistance as needed  Disposition Home    Diet: Heart Healthy Low Carb  Accuchecks 4 times/day, Once in AM empty stomach and then before each meal. Log in all results and show them to your Prim.MD in 3 days. If any glucose reading is under 80 or above 300 call your Prim MD immidiately. Follow Low glucose instructions for glucose under 80 as instructed.  Special Instructions: If you have smoked or chewed Tobacco  in the last 2 yrs please stop smoking, stop any regular Alcohol  and or any Recreational drug use.  On your next visit with your primary care physician please Get Medicines reviewed and adjusted.  Please request your Prim.MD to go over all Hospital Tests and  Procedure/Radiological results at the follow up, please get all Hospital records sent to your Prim MD by signing hospital release before you go home.  If you experience worsening of your admission symptoms, develop shortness of breath, life  threatening emergency, suicidal or homicidal thoughts you must seek medical attention immediately by calling 911 or calling your MD immediately  if symptoms less severe.  You Must read complete instructions/literature along with all the possible adverse reactions/side effects for all the Medicines you take and that have been prescribed to you. Take any new Medicines after you have completely understood and accpet all the possible adverse reactions/side effects.   Increase activity slowly   Complete by: As directed       Discharge Medications   Allergies as of 01/16/2021      Reactions   Erythromycin Other (See Comments)   Childhood reaction      Medication List    STOP taking these medications   lisinopril 20 MG tablet Commonly known as: ZESTRIL     TAKE these medications   Advair HFA 230-21 MCG/ACT inhaler Generic drug: fluticasone-salmeterol Inhale 2 puffs into the lungs 2 (two) times daily. puff   albuterol 108 (90 Base) MCG/ACT inhaler Commonly known as: VENTOLIN HFA Inhale 2 puffs into the lungs every 4 (four) hours as needed for wheezing or shortness of breath.   albuterol (2.5 MG/3ML) 0.083% nebulizer solution Commonly known as: PROVENTIL TAKE 3 MLS (2.5 MG TOTAL) BY NEBULIZATION EVERY 6 (SIX) HOURS AS NEEDED FOR WHEEZING OR SHORTNESS OF BREATH.   atorvastatin 20 MG tablet Commonly known as: LIPITOR Take 1 tablet (20 mg total) by mouth daily.   Depakote 500 MG DR tablet Generic drug: divalproex Take 500 mg by mouth 2 (two) times daily.   Detrol LA 4 MG 24 hr capsule Generic drug: tolterodine Take 4 mg by mouth daily.   Dexilant 30 MG capsule Generic drug: Dexlansoprazole Take 30 mg by mouth 2 (two) times daily.   Dulera  200-5 MCG/ACT Aero Generic drug: mometasone-formoterol Inhale 2 puffs into the lungs 2 (two) times daily.   escitalopram 20 MG tablet Commonly known as: LEXAPRO Take 1 tablet (20 mg total) by mouth daily.   gabapentin 300 MG capsule Commonly known as: NEURONTIN Take 300 mg by mouth 2 (two) times daily.   insulin aspart 100 UNIT/ML FlexPen Commonly known as: NOVOLOG Before each meal 3 times a day, 140-199 - 2 units, 200-250 - 4 units, 251-299 - 6 units,  300-349 - 8 units,  350 or above 10 units. Insulin PEN if approved, provide syringes and needles if needed.   insulin glargine 100 UNIT/ML Solostar Pen Commonly known as: LANTUS Inject 20 Units into the skin daily. What changed:   how much to take  how to take this  when to take this   loratadine 10 MG tablet Commonly known as: CLARITIN TAKE 1 TABLET (10 MG TOTAL) BY MOUTH DAILY. What changed: how much to take   montelukast 10 MG tablet Commonly known as: SINGULAIR Take 1 tablet (10 mg total) by mouth at bedtime.   oxybutynin 10 MG 24 hr tablet Commonly known as: DITROPAN-XL Take 10 mg by mouth every 12 (twelve) hours.   True Metrix Blood Glucose Test test strip Generic drug: glucose blood Use as instructed. Check blood glucose level by fingerstick twice per day.   True Metrix Meter w/Device Kit Use as instructed. Check blood glucose level by fingerstick twice per day.   TRUEplus Lancets 28G Misc Use as instructed. Check blood glucose level by fingerstick twice per day.            Durable Medical Equipment  (From admission, onward)         Start  Ordered   01/15/21 1049  For home use only DME 3 n 1  Once        01/15/21 1048   01/15/21 1048  For home use only DME Walker rolling  Once       Comments: 5 wheel  Question Answer Comment  Walker: With [place] Wheels   Patient needs a walker to treat with the following condition Weakness      01/15/21 1047           Follow-up Information     Elbert Ewings, FNP. Schedule an appointment as soon as possible for a visit in 1 week(s).   Specialty: Nurse Practitioner Contact information: [address] [place] [address] [phone]               Major procedures and Radiology Reports - PLEASE review detailed and final reports thoroughly  -       CT Head Wo Contrast  Result Date: 01/11/2021 CLINICAL DATA:  Mental status change.  Cause unknown. EXAM: CT HEAD WITHOUT CONTRAST TECHNIQUE: Contiguous axial images were obtained from the base of the skull through the vertex without intravenous contrast. COMPARISON:  CT head 10/22/2020 FINDINGS: Brain: Similar-appearing prominence of the lateral ventricles may be related to central predominant atrophy, although a component of normal pressure/communicating hydrocephalus cannot be excluded. No evidence of large-territorial acute infarction. No parenchymal hemorrhage. No mass lesion. No extra-axial collection. Patchy and confluent areas of decreased attenuation are noted throughout the deep and periventricular white matter of the cerebral hemispheres bilaterally, compatible with chronic microvascular ischemic disease. Hypodensity along the right pulse cerebral unchanged. No mass effect or midline shift. No hydrocephalus. Basilar cisterns are patent. Vascular: No hyperdense vessel. Atherosclerotic calcifications are present within the cavernous internal carotid arteries. Skull: No acute fracture or focal lesion. Sinuses/Orbits: Paranasal sinuses and mastoid air cells are clear. The orbits are unremarkable. Other: None. IMPRESSION: 1. No acute intracranial abnormality. 2. Similar-appearing prominence of the lateral ventricles may be related to central predominant atrophy, although a component of normal pressure/communicating hydrocephalus cannot be excluded. Electronically Signed   By: Iven Finn M.D.   On: 01/11/2021 23:46   DG CHEST PORT 1 VIEW  Result Date:  01/11/2021 CLINICAL DATA:  Weakness EXAM: PORTABLE CHEST 1 VIEW COMPARISON:  10/22/2020 FINDINGS: Cardiac shadow is enlarged but accentuated by the portable technique. Aortic calcifications are noted. The lungs are clear. No bony abnormality is noted. IMPRESSION: No acute abnormality noted. Electronically Signed   By: Inez Catalina M.D.   On: 01/11/2021 23:42    Micro Results     Recent Results (from the past 240 hour(s))  Resp Panel by RT-PCR (Flu A&B, Covid) Nasopharyngeal Swab     Status: None   Collection Time: 01/11/21 12:35 PM   Specimen: Nasopharyngeal Swab; Nasopharyngeal(NP) swabs in vial transport medium  Result Value Ref Range Status   SARS Coronavirus 2 by RT PCR NEGATIVE NEGATIVE Final    Comment: (NOTE) SARS-CoV-2 target nucleic acids are NOT DETECTED.  The SARS-CoV-2 RNA is generally detectable in upper respiratory specimens during the acute phase of infection. The lowest concentration of SARS-CoV-2 viral copies this assay can detect is 138 copies/mL. A negative result does not preclude SARS-Cov-2 infection and should not be used as the sole basis for treatment or other patient management decisions. A negative result may occur with  improper specimen collection/handling, submission of specimen other than nasopharyngeal swab, presence of viral mutation(s) within the areas targeted by  this assay, and inadequate number of viral copies(<138 copies/mL). A negative result must be combined with clinical observations, patient history, and epidemiological information. The expected result is Negative.  Fact Sheet for Patients:  EntrepreneurPulse.com.au  Fact Sheet for Healthcare Providers:  IncredibleEmployment.be  This test is no t yet approved or cleared by the [place] FDA and  has been authorized for detection and/or diagnosis of SARS-CoV-2 by FDA under an Emergency Use Authorization (EUA). This EUA will remain  in effect (meaning  this test can be used) for the duration of the COVID-19 declaration under Section 564(b)(1) of the Act, 21 U.S.C.section 360bbb-3(b)(1), unless the authorization is terminated  or revoked sooner.       Influenza A by PCR NEGATIVE NEGATIVE Final   Influenza B by PCR NEGATIVE NEGATIVE Final    Comment: (NOTE) The Xpert Xpress SARS-CoV-2/FLU/RSV plus assay is intended as an aid in the diagnosis of influenza from Nasopharyngeal swab specimens and should not be used as a sole basis for treatment. Nasal washings and aspirates are unacceptable for Xpert Xpress SARS-CoV-2/FLU/RSV testing.  Fact Sheet for Patients: EntrepreneurPulse.com.au  Fact Sheet for Healthcare Providers: IncredibleEmployment.be  This test is not yet approved or cleared by the [place] FDA and has been authorized for detection and/or diagnosis of SARS-CoV-2 by FDA under an Emergency Use Authorization (EUA). This EUA will remain in effect (meaning this test can be used) for the duration of the COVID-19 declaration under Section 564(b)(1) of the Act, 21 U.S.C. section 360bbb-3(b)(1), unless the authorization is terminated or revoked.  Performed at [facility], [place] [address], [place], [place] [address]     Today   Subjective    Carolyn Norton today has no headache,no chest abdominal pain,no new weakness tingling or numbness, feels much better wants to go home today.     Objective   Blood pressure 104/78, pulse 78, temperature 97.7 F (36.5 C), temperature source Oral, resp. rate 17, height _0  (1.676 m), weight 99.8 kg, last menstrual period 01/26/2017, SpO2 95 %.   Intake/Output Summary (Last 24 hours) at 01/16/2021 0913 Last data filed at 01/16/2021 0000 Gross per 24 hour  Intake 640 ml  Output 1400 ml  Net -760 ml    Exam  Awake Alert, No new F.N deficits, Normal affect [place].AT,[name] Supple Neck,No JVD, No cervical lymphadenopathy appriciated.   Symmetrical Chest wall movement, Good air movement bilaterally, CTAB RRR,No Gallops,Rubs or new Murmurs, No Parasternal Heave +ve B.Sounds, Abd Soft, Non tender, No organomegaly appriciated, No rebound -guarding or rigidity. No Cyanosis, Clubbing or edema, No new Rash or bruise   Data Review   CBC w Diff:  Lab Results  Component Value Date   WBC 9.1 01/16/2021   HGB 12.2 01/16/2021   HCT 37.3 01/16/2021   PLT 114 (L) 01/16/2021   LYMPHOPCT 42 01/16/2021   BANDSPCT 0 10/22/2020   MONOPCT 12 01/16/2021   EOSPCT 2 01/16/2021   BASOPCT 1 01/16/2021    CMP:  Lab Results  Component Value Date   NA 136 01/16/2021   K 4.6 01/16/2021   CL 100 01/16/2021   CO2 29 01/16/2021   BUN 12 01/16/2021   CREATININE 1.19 (H) 01/16/2021   PROT 5.7 (L) 01/16/2021   ALBUMIN 2.5 (L) 01/16/2021   BILITOT 0.4 01/16/2021   ALKPHOS 40 01/16/2021   AST 20 01/16/2021   ALT 11 01/16/2021  . Lab Results  Component Value Date   HGBA1C 11.0 (H) 01/12/2021  Total Time in preparing paper work, data evaluation and todays exam - 73 minutes  Lala Lund M.D on 01/16/2021 at 9:13 AM  [facility]

## 2021-01-16 NOTE — Progress Notes (Signed)
Occupational Therapy Treatment Patient Details Name: Carolyn Norton MRN: [id number] DOB: 12/29/1965 Today's Date: 01/16/2021    History of present illness Pt presents from home with multiple falls, confusion, and weakness. In ED blood glucose was 729 and pt was unable to ambulate.  Patient admitted with severe dehydration and metabolic encephalopathy. PMH: DM2, COPD, CKD3, GERD, bipolar disorder type 1, previous covid 19 infection, polysubstance abuse including cocaine.   OT comments  Pt is making steady gains toward OT goals. Demonstrated ability to ambulate with RW and min guard assist. Performed toileting, standing grooming with min guard assist. Changed clothing with set up in sitting. Pt continues to demonstrate impaired awareness of deficits/safety. Updated d/c recommendation to [facility] with 24 hour supervision. Will continue to follow.   Follow Up Recommendations  Supervision/Assistance - 24 hour;Home health OT    Equipment Recommendations  3 in 1 bedside commode (Rolling walker)    Recommendations for Other Services      Precautions / Restrictions Precautions Precautions: Fall Restrictions Weight Bearing Restrictions: No       Mobility Bed Mobility               General bed mobility comments: pt in chair    Transfers Overall transfer level: Needs assistance Equipment used: Rolling walker (2 wheeled) Transfers: Sit to/from Stand Sit to Stand: Min guard         General transfer comment: cues for hand placement, no physical assist    Balance Overall balance assessment: Needs assistance;History of Falls   Sitting balance-Leahy Scale: Good Sitting balance - Comments: no LOB donning socks   Standing balance support: Bilateral upper extremity supported;During functional activity Standing balance-Leahy Scale: Poor Standing balance comment: reliant on RW for ambulation, able to stand without support or LOB at sink                           ADL  either performed or assessed with clinical judgement   ADL Overall ADL's : Needs assistance/impaired Eating/Feeding: Independent;Sitting   Grooming: Brushing hair;Oral care;Standing;Min guard           Upper Body Dressing : Set up;Sitting   Lower Body Dressing: Set up;Sitting/lateral leans Lower Body Dressing Details (indicate cue type and reason): changed socks Toilet Transfer: Min guard;Ambulation;RW   Toileting- Architect and Hygiene: Min guard;Sit to/from stand       Functional mobility during ADLs: Health and safety inspector     Praxis      Cognition Arousal/Alertness: Awake/alert Behavior During Therapy: WFL for tasks assessed/performed Overall Cognitive Status: Impaired/Different from baseline Area of Impairment: Safety/judgement;Memory                     Memory: Decreased short-term memory   Safety/Judgement: Decreased awareness of deficits;Decreased awareness of safety     General Comments: pt oriented, recall her hair has been washed since hospitalization        Exercises     Shoulder Instructions       General Comments      Pertinent Vitals/ Pain       Pain Assessment: No/denies pain  Home Living  Prior Functioning/Environment              Frequency  Min 2X/week        Progress Toward Goals  OT Goals(current goals can now be found in the care plan section)  Progress towards OT goals: Progressing toward goals  Acute Rehab OT Goals Patient Stated Goal: to return home OT Goal Formulation: With patient Time For Goal Achievement: 01/27/21 Potential to Achieve Goals: Good  Plan Discharge plan needs to be updated    Co-evaluation                 AM-PAC OT "6 Clicks" Daily Activity     Outcome Measure   Help from another person eating meals?: None Help from another person taking care of personal grooming?: A  Little Help from another person toileting, which includes using toliet, bedpan, or urinal?: A Little Help from another person bathing (including washing, rinsing, drying)?: A Little Help from another person to put on and taking off regular upper body clothing?: None Help from another person to put on and taking off regular lower body clothing?: A Little 6 Click Score: 20    End of Session Equipment Utilized During Treatment: Gait belt;Rolling walker  OT Visit Diagnosis: Unsteadiness on feet (R26.81);Other abnormalities of gait and mobility (R26.89);Muscle weakness (generalized) (M62.81)   Activity Tolerance Patient tolerated treatment well   Patient Left in chair;with call bell/phone within reach   Nurse Communication  (aware pt needs chair alarm)        Time: 4742-5956 OT Time Calculation (min): 17 min  Charges: OT General Charges $OT Visit: 1 Visit OT Treatments $Self Care/Home Management : 8-22 mins  Martie Round, OTR/L Acute Rehabilitation Services Pager: [phone] Office: [phone]   Evern Bio 01/16/2021, 10:52 AM

## 2021-01-16 NOTE — Progress Notes (Signed)
Physical Therapy Treatment Patient Details Name: Carolyn Norton MRN: [id number] DOB: May 16, 1966 Today's Date: 01/16/2021    History of Present Illness Pt presents from home with multiple falls, confusion, and weakness. In ED blood glucose was 729 and pt was unable to ambulate.  Patient admitted with severe dehydration and metabolic encephalopathy. PMH: DM2, COPD, CKD3, GERD, bipolar disorder type 1, previous covid 19 infection, polysubstance abuse including cocaine.    PT Comments    Pt is medically ready to discharge however mother and aunt report they will not accept pt home because they can not care for her given her frequent falling. Pt seems okay with the news and is agreeable to working on balance and ambulation so that she can go home with more independence. Pt is limited in safe mobility by her decreased balance and decrease cognition especially with command follow and short term memory for correction of RW management. Pt is able to progress from min A to supervision with sit to stands. Pt able to ambulate with min A but maximal multimodal cuing for safety. D/c plans remain appropriate at this time. PT acknowledges pt is difficult to place. PT will continue to follow acutely.    Follow Up Recommendations  SNF;Supervision/Assistance - 24 hour     Equipment Recommendations  Rolling walker with 5" wheels;3in1 (PT)    Recommendations for Other Services OT consult     Precautions / Restrictions Precautions Precautions: Fall Restrictions Weight Bearing Restrictions: No    Mobility  Bed Mobility  Pt is up in chair  Transfers Overall transfer level: Needs assistance Equipment used: Rolling walker (2 wheeled) Transfers: Sit to/from Stand Sit to Stand: Min guard;Min assist;Supervision        General transfer comment: cues for hand placement, ultimately had to remove RW from in front of her for her not to reach up to it. after which she was able to stand with min  guard  Ambulation/Gait Ambulation/Gait assistance: Min assist Gait Distance (Feet): 25 Feet Assistive device: Rolling walker (2 wheeled) Gait Pattern/deviations: Step-to pattern;Decreased step length - right;Decreased step length - left;Trunk flexed;Shuffle Gait velocity: decreased   General Gait Details: min A and maximal multimodal cuing for proximity to RW and upright posture, at end of ambulation therapist stood right behind pt       Balance Overall balance assessment: Needs assistance;History of Falls Sitting-balance support: No upper extremity supported;Feet supported Sitting balance-Leahy Scale: Fair Sitting balance - Comments: Supervision A for safety. Postural control: Posterior lean Standing balance support: Bilateral upper extremity supported;During functional activity Standing balance-Leahy Scale: Poor Standing balance comment: Requires external assist to maintain static standing balance up to mod assist due to posterior lean                            Cognition Arousal/Alertness: Awake/alert Behavior During Therapy: WFL for tasks assessed/performed Overall Cognitive Status: Impaired/Different from baseline Area of Impairment: Safety/judgement;Memory                     Memory: Decreased short-term memory   Safety/Judgement: Decreased awareness of deficits;Decreased awareness of safety            Exercises Other Exercises Other Exercises: 5x sit<>stand    General Comments General comments (skin integrity, edema, etc.): VSS ob RA      Pertinent Vitals/Pain Pain Assessment: No/denies pain           PT Goals (current goals can now  be found in the care plan section) Acute Rehab PT Goals Patient Stated Goal: No goals stated. PT Goal Formulation: With patient Time For Goal Achievement: 01/26/21 Potential to Achieve Goals: Good Progress towards PT goals: Progressing toward goals    Frequency    Min 2X/week      PT Plan  Current plan remains appropriate;Frequency needs to be updated       AM-PAC PT "6 Clicks" Mobility   Outcome Measure  Help needed turning from your back to your side while in a flat bed without using bedrails?: None Help needed moving from lying on your back to sitting on the side of a flat bed without using bedrails?: A Little Help needed moving to and from a bed to a chair (including a wheelchair)?: A Lot Help needed standing up from a chair using your arms (e.g., wheelchair or bedside chair)?: A Lot Help needed to walk in hospital room?: Total Help needed climbing 3-5 steps with a railing? : Total 6 Click Score: 13    End of Session Equipment Utilized During Treatment: Gait belt Activity Tolerance: Patient limited by fatigue Patient left: with call bell/phone within reach;in chair;with chair alarm set Nurse Communication: Mobility status PT Visit Diagnosis: Muscle weakness (generalized) (M62.81);Repeated falls (R29.6);Difficulty in walking, not elsewhere classified (R26.2);Unsteadiness on feet (R26.81)     Time: 2706-2376 PT Time Calculation (min) (ACUTE ONLY): 21 min  Charges:  $Gait Training: 8-22 mins                     Bilaal Leib B. Beverely Risen PT, DPT Acute Rehabilitation Services Pager [phone] Office [phone]    [name] Fleet 01/16/2021, 4:00 PM

## 2021-01-16 NOTE — TOC Progression Note (Addendum)
Transition of Care [facility]) - Progression Note    Patient Details  Name: Carolyn Norton MRN: [id number] Date of Birth: [birth year]  Transition of Care [facility]) CM/SW Contact  Beckie Busing, RN Phone Number: [phone] 01/16/2021, 10:21 AM  Clinical Narrative:    CM received call from patients aunt [name]. Aunt is inquiring about patients medicaid/disability and where patient will go upon discharge. CM has reviewed financial counselors notes and it has been determined that patient has been screened and is not eligible for medicaid. The patient was also screened on last admission in Feb and was determined to not be eligible for medicaid. The aunt states that no one will be coming to pick the patient up because they absolutely can not take care of the patient at home. CM has explained that this patient can not be placed at a facility with no source of payment. The patient is not eligible to attempt to obtain a LOG from hospital because the patient is not eligible for medicaid. The aunt states that she wants to speak with the physician about the patient. Message has been sent to MD.  1120 CM informed that family has questions about medicaid. CM called Aunt [name]) to answer questions. Currently there are no questions but aunt has been informed that the patient has been medically discharged. Aunt has been advised that from a hospital standpoint the patient does not meet  Criteria for medicaid and that the family should follow up with medicaid and disability application process outside of the hospital and with the PCP. CM informed aunt that MD has requested transport. Per aunt the family will not allow patient to enter home if she is transported. Per MD patient does have capacity.     1130 CM spoke with patient via phone. Patient verbalized understanding of being discharged and transport home. Patient has been made aware that her aunt [name] is stating that her parents will not be able to  take her in if she is transported home. Patient states that she is not doing crack anymore and that she is able to walk around her hospital room. Patient states that she does wish to be transported home.   1145 CM spoke with mother [name] via phone. Mother has been made aware that patient is medically discharged and that patient wishes to return home and transport will be set up. Mother states that she is trying to get in touch with patients primary physician for medical records. CM has advised mother to follow up with disability/ medicaid application outside of hospital. Mother verbalized understanding that daughter would return home and states that she would just have to call again if the patient falls because she cant get her up.  1200 CM called bedside nurse to request time for transport set up. Bedside nurse states that she will need to speak with patients mother per MD request. Nurse to call CM after to determine if transport is to be set up.    Expected Discharge Plan: (P) Home/Self Care Barriers to Discharge: Continued Medical Work up  Expected Discharge Plan and Services Expected Discharge Plan: (P) Home/Self Care   Discharge Planning Services: (P) CM Consult   Living arrangements for the past 2 months: (P) Single Family Home Expected Discharge Date: 01/16/21  Social Determinants of Health (SDOH) Interventions    Readmission Risk Interventions Readmission Risk Prevention Plan 01/14/2021  Transportation Screening Complete  PCP or Specialist Appt within 3-5 Days (No Data)  Social Work Consult for Recovery Care Planning/Counseling Complete  Palliative Care Screening Not Applicable  Medication Review Oceanographer) Referral to Pharmacy  Some recent data might be hidden

## 2021-01-16 NOTE — Progress Notes (Signed)
01/16/2021 Rn spoke to patient mom via speaker phone while patient was in room. The patient's mom report both she and her husband is unable to care for the patient at home. Dr Thedore Mins  was made aware via chat. [facility] RN.

## 2021-01-16 NOTE — Discharge Instructions (Signed)
Follow with Primary MD [name], FNP in 7 days   Get CBC, CMP, 2 view Chest X ray -  checked next visit within 1 week by Primary MD   Activity: As tolerated with Full fall precautions use walker/cane & assistance as needed  Disposition Home    Diet: Heart Healthy Low Carb  Accuchecks 4 times/day, Once in AM empty stomach and then before each meal. Log in all results and show them to your Prim.MD in 3 days. If any glucose reading is under 80 or above 300 call your Prim MD immidiately. Follow Low glucose instructions for glucose under 80 as instructed.  Special Instructions: If you have smoked or chewed Tobacco  in the last 2 yrs please stop smoking, stop any regular Alcohol  and or any Recreational drug use.  On your next visit with your primary care physician please Get Medicines reviewed and adjusted.  Please request your Prim.MD to go over all Hospital Tests and Procedure/Radiological results at the follow up, please get all Hospital records sent to your Prim MD by signing hospital release before you go home.  If you experience worsening of your admission symptoms, develop shortness of breath, life threatening emergency, suicidal or homicidal thoughts you must seek medical attention immediately by calling 911 or calling your MD immediately  if symptoms less severe.  You Must read complete instructions/literature along with all the possible adverse reactions/side effects for all the Medicines you take and that have been prescribed to you. Take any new Medicines after you have completely understood and accpet all the possible adverse reactions/side effects.

## 2021-01-17 DIAGNOSIS — Z9114 Patient's other noncompliance with medication regimen: Secondary | ICD-10-CM

## 2021-01-17 LAB — GLUCOSE, CAPILLARY
Glucose-Capillary: 173 mg/dL — ABNORMAL HIGH (ref 70–99)
Glucose-Capillary: 106 mg/dL — ABNORMAL HIGH (ref 70–99)
Glucose-Capillary: 142 mg/dL — ABNORMAL HIGH (ref 70–99)
Glucose-Capillary: 141 mg/dL — ABNORMAL HIGH (ref 70–99)

## 2021-01-17 MED ORDER — INSULIN ASPART 100 UNIT/ML IJ SOLN
0.0000 [IU] | Freq: Three times a day (TID) | INTRAMUSCULAR | Status: DC
  Administered 2021-01-17 – 2021-01-18 (×2): 1 [IU] via SUBCUTANEOUS
  Administered 2021-01-18: 7 [IU] via SUBCUTANEOUS
  Administered 2021-01-19: 1 [IU] via SUBCUTANEOUS
  Administered 2021-01-19: 3 [IU] via SUBCUTANEOUS
  Administered 2021-01-19: 2 [IU] via SUBCUTANEOUS
  Administered 2021-01-20: 3 [IU] via SUBCUTANEOUS
  Administered 2021-01-20: 9 [IU] via SUBCUTANEOUS
  Administered 2021-01-21 (×2): 3 [IU] via SUBCUTANEOUS
  Administered 2021-01-22 – 2021-01-23 (×3): 5 [IU] via SUBCUTANEOUS
  Administered 2021-01-24 (×3): 3 [IU] via SUBCUTANEOUS
  Administered 2021-01-25: 1 [IU] via SUBCUTANEOUS
  Administered 2021-01-25: 5 [IU] via SUBCUTANEOUS
  Administered 2021-01-25: 9 [IU] via SUBCUTANEOUS
  Administered 2021-01-26 (×2): 3 [IU] via SUBCUTANEOUS
  Administered 2021-01-27: 7 [IU] via SUBCUTANEOUS
  Administered 2021-01-27: 3 [IU] via SUBCUTANEOUS
  Administered 2021-01-27: 5 [IU] via SUBCUTANEOUS
  Administered 2021-01-28: 1 [IU] via SUBCUTANEOUS
  Administered 2021-01-28: 5 [IU] via SUBCUTANEOUS
  Administered 2021-01-28: 3 [IU] via SUBCUTANEOUS
  Administered 2021-01-29: 7 [IU] via SUBCUTANEOUS
  Administered 2021-01-29: 9 [IU] via SUBCUTANEOUS
  Administered 2021-01-29: 3 [IU] via SUBCUTANEOUS
  Administered 2021-01-30: 9 [IU] via SUBCUTANEOUS
  Administered 2021-01-30: 3 [IU] via SUBCUTANEOUS
  Administered 2021-01-31: 9 [IU] via SUBCUTANEOUS
  Administered 2021-01-31: 5 [IU] via SUBCUTANEOUS

## 2021-01-17 MED ORDER — INSULIN ASPART 100 UNIT/ML IJ SOLN
2.0000 [IU] | Freq: Three times a day (TID) | INTRAMUSCULAR | Status: DC
  Administered 2021-01-17 (×2): 2 [IU] via SUBCUTANEOUS

## 2021-01-17 MED ORDER — INSULIN ASPART 100 UNIT/ML IJ SOLN: 0.0000 [IU] | Freq: Every day | INTRAMUSCULAR | Status: DC

## 2021-01-17 NOTE — Progress Notes (Addendum)
PROGRESS NOTE                                                                                                                                                                                                             Patient Demographics:    Carolyn Norton, is a 55 y.o. female, DOB - 09-16-1965, YSA:[id number]  Outpatient Primary MD for the patient is [name], FNP    LOS - 5  Admit date - 01/11/2021    Chief Complaint  Patient presents with  . Generalized Weakness  . Multiple Falls  . Medication Noncompliance  . Altered Mental Status  . Hyperglycemia       Brief Narrative (HPI from H&P)  -  Carolyn Norton is a 55 y.o. female with medical history significant for DM2, bipolar disorder type 1, previous covid 19 infection, polysubstance abuse including cocaine, who presented from home where she lives with her parents due to generalized weakness, multiple falls and confusion, I called her mother and was informed that patient has been in very poor health with inability to walk without falling over for at least a month, she did her last dose of crack cocaine about a month ago and ever since then she has been not doing well, poor balance multiple falls, not taking her medications, sugars running high.  In the ER she was diagnosed with severe dehydration, elevated sugars, metabolic encephalopathy and she was admitted to the hospital.   Subjective:   Patient in bed, appears comfortable, denies any headache, no fever, no chest pain or pressure, no shortness of breath , no abdominal pain. No new focal weakness.    Assessment  & Plan :     1. Acute metabolic encephalopathy due to combination of severe dehydration, poorly controlled type 2 diabetes mellitus with hyperglycemia - she will be treated supportively with Lantus and sliding scale, hydrate with IV fluids, CT head is nonacute, she does not have any focal  deficits, much improved after supportive care continue to monitor.  Note patient was discharged on 01/16/2021 however she does not have a safe disposition as her mother refused to take her back home.  Patient is alert awake oriented and has the capacity to decide for herself, she is agreeable to placement at a rehab.  Social work requested to provide safe disposition.   2.  History of polysubstance abuse including crack cocaine abuse.  Counseled to quit.  3.  History of bipolar disorder.  Resumed home dose Escitalopram, Neurontin & Depakote will monitor.  No acute issues currently.  SNF requested psych input which we will obtain however initially appears to be stable from the standpoint.  Clearly has intact capacity to make her decisions.  4.  COPD.  Supportive care no acute issues.  5.  AKI on CKD 3A.  Baseline creatinine around 1.3.  AKI resolved after hydration with IV fluid.  6. GERD - PPI  7.  Hypokalemia and hypomagnesemia.  Replaced again we will continue to monitor.  8.  Dyslipidemia.  On home dose statin.  9. DM type II with hyperglycemia.  A1c of 11.6 suggesting poor glycemic control outpatient due to hyperglycemia, glucose control much improved less than half of home dose insulin sliding scale, questionable compliance, consult, she will also educate him further diabetic educator in the hospital .   Lab Results  Component Value Date   HGBA1C 11.0 (H) 01/12/2021   CBG (last 3)  Recent Labs    01/16/21 1557 01/16/21 1913 01/17/21 0803  GLUCAP 145* 204* 173*        Condition -  Fair  Family Communication  :  Mother [name] [phone] - 01/12/21, 01/16/21, Aunt 01/16/21  Code Status : Full  Consults  : Psych  PUD Prophylaxis : PPI   Procedures  :     CT Head - Non Acute      Disposition Plan  :     Dispo:  Patient From: Home  Planned Disposition: Home  Medically stable for discharge: No     DVT Prophylaxis  :    enoxaparin (LOVENOX) injection 40 mg  Start: 01/12/21 1000     Lab Results  Component Value Date   PLT 114 (L) 01/16/2021    Diet :  Diet Order            Diet heart healthy/carb modified Room service appropriate? Yes; Fluid consistency: Thin  Diet effective now                  Inpatient Medications  Scheduled Meds: . aspirin  81 mg Oral Daily  . atorvastatin  20 mg Oral Daily  . divalproex  500 mg Oral BID  . enoxaparin (LOVENOX) injection  40 mg Subcutaneous Daily  . escitalopram  20 mg Oral Daily  . gabapentin  300 mg Oral BID  . insulin aspart  0-5 Units Subcutaneous QHS  . insulin aspart  0-9 Units Subcutaneous TID WC  . insulin aspart  2 Units Subcutaneous TID WC  . insulin glargine  20 Units Subcutaneous Daily  . montelukast  10 mg Oral QHS  . pantoprazole  40 mg Oral Daily   Continuous Infusions:  PRN Meds:.acetaminophen, albuterol, dextrose, melatonin, ondansetron (ZOFRAN) IV  Antibiotics  :    Anti-infectives (From admission, onward)   None       Time Spent in minutes  30   Susa Raring M.D on 01/17/2021 at 9:57 AM  To page go to [email or web address]   [facility] -  Office  [phone]    See all Orders from today for further details    Objective:   Vitals:   01/14/21 1227 01/14/21 2021 01/15/21 0509 01/15/21 1956  BP: (!) 127/116 127/63 113/75 104/78  Pulse: 67 64 68 78  Resp: Temp: 98 F (36.7 C) 97.7 F (36.5 C)  97.9 F (36.6 C) 97.7 F (36.5 C)  TempSrc:  Oral Oral Oral  SpO2: 96% 96% 95% 95%  Weight:      Height:        Wt Readings from Last 3 Encounters:  01/11/21 99.8 kg  11/21/20 85.5 kg  10/22/20 85.5 kg     Intake/Output Summary (Last 24 hours) at 01/17/2021 0957 Last data filed at 01/16/2021 1111 Gross per 24 hour  Intake 240 ml  Output 750 ml  Net -510 ml     Physical Exam  Awake Alert, No new F.N deficits, Normal affect [place].AT,[name] Supple Neck,No JVD, No cervical lymphadenopathy appriciated.  Symmetrical Chest wall  movement, Good air movement bilaterally, CTAB RRR,No Gallops, Rubs or new Murmurs, No Parasternal Heave +ve B.Sounds, Abd Soft, No tenderness, No organomegaly appriciated, No rebound - guarding or rigidity. No Cyanosis, Clubbing or edema, No new Rash or bruise    Data Review:    CBC Recent Labs  Lab 01/11/21 1227 01/11/21 1236 01/12/21 0232 01/13/21 0349 01/14/21 0238 01/15/21 0230 01/16/21 0201  WBC 14.1*  --  9.2 5.8 5.2 6.1 9.1  HGB 13.5   < > 12.6 12.2 11.9* 13.2 12.2  HCT 41.6   < > 39.1 36.2 36.2 40.3 37.3  PLT 175  --  137* 129* PLATELET CLUMPS NOTED ON SMEAR, UNABLE TO ESTIMATE 113* 114*  MCV 87.9  --  88.9 86.0 86.8 87.4 86.9  MCH 28.5  --  28.6 29.0 28.5 28.6 28.4  MCHC 32.5  --  32.2 33.7 32.9 32.8 32.7  RDW 13.5  --  13.9 13.9 14.2 14.4 14.7  LYMPHSABS 1.6  --   --  2.5 2.3 2.8 3.8  MONOABS 1.0  --   --  0.7 0.8 1.0 1.1*  EOSABS 0.0  --   --  0.1 0.1 0.1 0.2  BASOSABS 0.0  --   --  0.0 0.0 0.0 0.1   < > = values in this interval not displayed.    Recent Labs  Lab 01/11/21 1227 01/11/21 1236 01/12/21 0232 01/12/21 1403 01/13/21 0349 01/14/21 0238 01/15/21 0230 01/16/21 0201  NA 127*   < > 136  --  136 138 142 136  K 4.7   < > 5.3*  --  2.7* 3.4* 4.0 4.6  CL 88*  --  102  --  98 102 104 100  CO2 27  --  22  --  32 32 32 29  GLUCOSE 729*  --  249*  --  162* 137* 32* 135*  BUN 21*  --  20  --  12 8 7 12   CREATININE 1.59*  --  1.21*  --  1.06* 1.02* 1.06* 1.19*  CALCIUM 8.7*  --  8.1*  --  8.2* 8.2* 9.1 9.1  AST 11*  --   --   --  14* 13* 14* 20  ALT 8  --   --   --  9 10 10 11   ALKPHOS 85  --   --   --  35* 42 44 40  BILITOT 0.9  --   --   --  0.2* 0.2* 0.4 0.4  ALBUMIN 3.0*  --   --   --  2.2* 2.3* 2.6* 2.5*  MG  --   --  1.9  --  1.5* 1.9 1.8 1.7  PROCALCITON  --   --   --  0.15 0.11 <0.10  --   --   HGBA1C  --   --  11.0*  --   --   --   --   --   BNP  --   --   --   --  219.6* 177.8* 115.9* 40.9   < > = values in this interval not displayed.     ------------------------------------------------------------------------------------------------------------------ No results for input(s): CHOL, HDL, LDLCALC, TRIG, CHOLHDL, LDLDIRECT in the last 72 hours.  Lab Results  Component Value Date   HGBA1C 11.0 (H) 01/12/2021   ------------------------------------------------------------------------------------------------------------------ No results for input(s): TSH, T4TOTAL, T3FREE, THYROIDAB in the last 72 hours.  Invalid input(s): FREET3  Cardiac Enzymes No results for input(s): CKMB, TROPONINI, MYOGLOBIN in the last 168 hours.  Invalid input(s): CK ------------------------------------------------------------------------------------------------------------------    Component Value Date/Time   BNP 40.9 01/16/2021 0201    Micro Results Recent Results (from the past 240 hour(s))  Resp Panel by RT-PCR (Flu A&B, Covid) Nasopharyngeal Swab     Status: None   Collection Time: 01/11/21 12:35 PM   Specimen: Nasopharyngeal Swab; Nasopharyngeal(NP) swabs in vial transport medium  Result Value Ref Range Status   SARS Coronavirus 2 by RT PCR NEGATIVE NEGATIVE Final    Comment: (NOTE) SARS-CoV-2 target nucleic acids are NOT DETECTED.  The SARS-CoV-2 RNA is generally detectable in upper respiratory specimens during the acute phase of infection. The lowest concentration of SARS-CoV-2 viral copies this assay can detect is 138 copies/mL. A negative result does not preclude SARS-Cov-2 infection and should not be used as the sole basis for treatment or other patient management decisions. A negative result may occur with  improper specimen collection/handling, submission of specimen other than nasopharyngeal swab, presence of viral mutation(s) within the areas targeted by this assay, and inadequate number of viral copies(<138 copies/mL). A negative result must be combined with clinical observations, patient history, and  epidemiological information. The expected result is Negative.  Fact Sheet for Patients:  BloggerCourse.comhttps://www.fda.gov/media/152166/download  Fact Sheet for Healthcare Providers:  SeriousBroker.ithttps://www.fda.gov/media/152162/download  This test is no t yet approved or cleared by the [place] FDA and  has been authorized for detection and/or diagnosis of SARS-CoV-2 by FDA under an Emergency Use Authorization (EUA). This EUA will remain  in effect (meaning this test can be used) for the duration of the COVID-19 declaration under Section 564(b)(1) of the Act, 21 U.S.C.section 360bbb-3(b)(1), unless the authorization is terminated  or revoked sooner.       Influenza A by PCR NEGATIVE NEGATIVE Final   Influenza B by PCR NEGATIVE NEGATIVE Final    Comment: (NOTE) The Xpert Xpress SARS-CoV-2/FLU/RSV plus assay is intended as an aid in the diagnosis of influenza from Nasopharyngeal swab specimens and should not be used as a sole basis for treatment. Nasal washings and aspirates are unacceptable for Xpert Xpress SARS-CoV-2/FLU/RSV testing.  Fact Sheet for Patients: BloggerCourse.comhttps://www.fda.gov/media/152166/download  Fact Sheet for Healthcare Providers: SeriousBroker.ithttps://www.fda.gov/media/152162/download  This test is not yet approved or cleared by the [place] FDA and has been authorized for detection and/or diagnosis of SARS-CoV-2 by FDA under an Emergency Use Authorization (EUA). This EUA will remain in effect (meaning this test can be used) for the duration of the COVID-19 declaration under Section 564(b)(1) of the Act, 21 U.S.C. section 360bbb-3(b)(1), unless the authorization is terminated or revoked.  Performed at [facility], [address] [place], [place] [address]     Radiology Reports CT Head Wo Contrast  Result Date: 01/11/2021 CLINICAL DATA:  Mental status change.  Cause unknown. EXAM: CT HEAD WITHOUT CONTRAST TECHNIQUE: Contiguous axial images were obtained  from the base of the skull  through the vertex without intravenous contrast. COMPARISON:  CT head 10/22/2020 FINDINGS: Brain: Similar-appearing prominence of the lateral ventricles may be related to central predominant atrophy, although a component of normal pressure/communicating hydrocephalus cannot be excluded. No evidence of large-territorial acute infarction. No parenchymal hemorrhage. No mass lesion. No extra-axial collection. Patchy and confluent areas of decreased attenuation are noted throughout the deep and periventricular white matter of the cerebral hemispheres bilaterally, compatible with chronic microvascular ischemic disease. Hypodensity along the right pulse cerebral unchanged. No mass effect or midline shift. No hydrocephalus. Basilar cisterns are patent. Vascular: No hyperdense vessel. Atherosclerotic calcifications are present within the cavernous internal carotid arteries. Skull: No acute fracture or focal lesion. Sinuses/Orbits: Paranasal sinuses and mastoid air cells are clear. The orbits are unremarkable. Other: None. IMPRESSION: 1. No acute intracranial abnormality. 2. Similar-appearing prominence of the lateral ventricles may be related to central predominant atrophy, although a component of normal pressure/communicating hydrocephalus cannot be excluded. Electronically Signed   By: Tish Frederickson M.D.   On: 01/11/2021 23:46   DG CHEST PORT 1 VIEW  Result Date: 01/11/2021 CLINICAL DATA:  Weakness EXAM: PORTABLE CHEST 1 VIEW COMPARISON:  10/22/2020 FINDINGS: Cardiac shadow is enlarged but accentuated by the portable technique. Aortic calcifications are noted. The lungs are clear. No bony abnormality is noted. IMPRESSION: No acute abnormality noted. Electronically Signed   By: Alcide Clever M.D.   On: 01/11/2021 23:42

## 2021-01-17 NOTE — Progress Notes (Signed)
Occupational Therapy Treatment Patient Details Name: Carolyn Norton MRN: [id number] DOB: 1966/05/30 Today's Date: 01/17/2021    History of present illness Pt presents from home on 01/13/21 with multiple falls, confusion, and weakness. In ED blood glucose was 729 and pt was unable to ambulate.  Patient admitted with severe dehydration and metabolic encephalopathy. PMH: DM2, COPD, CKD3, GERD, bipolar disorder type 1, previous covid 19 infection, polysubstance abuse including cocaine.   OT comments  Assisted pt to bathroom and sink for grooming with min guard assist and RW. Continues to demonstrate decreased awareness of fall risk and deficits and requires multimodal cues for walker use. Pt is aware that her family is unable to care for her. Pt grateful to be able to use bathroom and not purewick.   Follow Up Recommendations  Supervision/Assistance - 24 hour;Home health OT    Equipment Recommendations  3 in 1 bedside commode (RW)    Recommendations for Other Services      Precautions / Restrictions Precautions Precautions: Fall       Mobility Bed Mobility Overal bed mobility: Needs Assistance Bed Mobility: Supine to Sit       Sit to supine: Supervision   General bed mobility comments: supervision for safety    Transfers Overall transfer level: Needs assistance Equipment used: Rolling walker (2 wheeled) Transfers: Sit to/from Stand Sit to Stand: Min guard         General transfer comment: multimodal cues for hand placement, stood from bed and 3 in 1    Balance Overall balance assessment: Needs assistance;History of Falls   Sitting balance-Leahy Scale: Good       Standing balance-Leahy Scale: Poor Standing balance comment: no LOB statically at sink and with pericare, poor dynamic standing balance                           ADL either performed or assessed with clinical judgement   ADL Overall ADL's : Needs assistance/impaired Eating/Feeding: Set  up;Bed level Eating/Feeding Details (indicate cue type and reason): assisted to open containers Grooming: Wash/dry hands;Standing;Min guard Grooming Details (indicate cue type and reason): cues to step into walker, closer to sink                 Toilet Transfer: Min guard;Ambulation;RW   Toileting- Architect and Hygiene: Min guard;Sit to/from stand       Functional mobility during ADLs: Min guard;Rolling walker (in room/bathroom)       Vision       Perception     Praxis      Cognition Arousal/Alertness: Awake/alert Behavior During Therapy: WFL for tasks assessed/performed Overall Cognitive Status: Impaired/Different from baseline Area of Impairment: Safety/judgement;Memory                     Memory: Decreased short-term memory   Safety/Judgement: Decreased awareness of safety;Decreased awareness of deficits              Exercises     Shoulder Instructions       General Comments      Pertinent Vitals/ Pain       Pain Assessment: No/denies pain  Home Living                                          Prior Functioning/Environment  Frequency  Min 2X/week        Progress Toward Goals  OT Goals(current goals can now be found in the care plan section)  Progress towards OT goals: Progressing toward goals  Acute Rehab OT Goals Patient Stated Goal: pt wants to go back home OT Goal Formulation: With patient Time For Goal Achievement: 01/27/21 Potential to Achieve Goals: Good  Plan Discharge plan remains appropriate    Co-evaluation                 AM-PAC OT "6 Clicks" Daily Activity     Outcome Measure   Help from another person eating meals?: A Little Help from another person taking care of personal grooming?: A Little Help from another person toileting, which includes using toliet, bedpan, or urinal?: A Little Help from another person bathing (including washing, rinsing,  drying)?: A Little Help from another person to put on and taking off regular upper body clothing?: None Help from another person to put on and taking off regular lower body clothing?: A Little 6 Click Score: 19    End of Session Equipment Utilized During Treatment: Rolling walker;Gait belt  OT Visit Diagnosis: Unsteadiness on feet (R26.81);Other abnormalities of gait and mobility (R26.89);Muscle weakness (generalized) (M62.81)   Activity Tolerance Patient tolerated treatment well   Patient Left in bed;with call bell/phone within reach;with bed alarm set   Nurse Communication          Time: 8921-1941 OT Time Calculation (min): 18 min  Charges: OT General Charges $OT Visit: 1 Visit OT Treatments $Self Care/Home Management : 8-22 mins   Evern Bio 01/17/2021, 2:28 PM  Martie Round, OTR/L Acute Rehabilitation Services Pager: [phone] Office: [phone]

## 2021-01-17 NOTE — [place] FL2 (Signed)
[place] MEDICAID FL2 LEVEL OF CARE SCREENING TOOL     IDENTIFICATION  Patient Name: Carolyn Norton Birthdate: 01-Apr-1966 Sex: female Admission Date (Current Location): 01/11/2021  [facility] and [place] Number:  Producer, television/film/video and Address:  [facility], [address] [place], [place] [address]      Provider Number: [id number]  Attending Physician Name and Address:  Leroy Sea, MD  Relative Name and Phone Number:  Carolyn Norton [phone]    Current Level of Care: Hospital Recommended Level of Care: Skilled Nursing Facility Prior Approval Number:    Date Approved/Denied:   PASRR Number:    Discharge Plan: SNF    Current Diagnoses: Patient Active Problem List   Diagnosis Date Noted  . Generalized weakness 01/11/2021  . Acute metabolic encephalopathy 10/22/2020  . Hypotension 10/22/2020  . Leukocytosis 10/22/2020  . Type 2 diabetes mellitus (HCC) 10/22/2020  . Altered mental status   . Hyperosmolar hyperglycemic state (HHS) (HCC)   . Hyperglycemia   . Hyperkalemia   . AKI (acute kidney injury) (HCC)   . Essential hypertension   . DKA (diabetic ketoacidosis) (HCC) 09/06/2020  . COVID-19 09/06/2020  . Bipolar 1 disorder (HCC) 09/06/2020  . Breast mass status post excision 09/06/2020  . Smoker 09/06/2020  . Metabolic acidosis due to ingestion of drugs or chemicals 09/06/2020    Orientation RESPIRATION BLADDER Height & Weight     Self,Time,Situation,Place  Normal Continent Weight: 99.8 kg Height:  5\' 6"  (167.6 cm)  BEHAVIORAL SYMPTOMS/MOOD NEUROLOGICAL BOWEL NUTRITION STATUS      Continent Diet (Heart Healthy Carb modified)  AMBULATORY STATUS COMMUNICATION OF NEEDS Skin   Limited Assist Verbally Normal                       Personal Care Assistance Level of Assistance  Bathing,Feeding,Dressing Bathing Assistance: Limited assistance Feeding assistance: Independent Dressing Assistance: Limited assistance      Functional Limitations Info  Sight,Hearing,Speech Sight Info: Adequate Hearing Info: Adequate Speech Info: Adequate    SPECIAL CARE FACTORS FREQUENCY  PT (By licensed PT),OT (By licensed OT)     PT Frequency: 5X OT Frequency: 5X            Contractures Contractures Info: Not present    Additional Factors Info  Code Status,Allergies,Psychotropic,Insulin Sliding Scale,Isolation Precautions,Suctioning Needs Code Status Info: DNR Allergies Info: Erythromycin Psychotropic Info: see discharge summary for psychotropic info Insulin Sliding Scale Info: see discharge summary for sliding scale information Isolation Precautions Info: none Suctioning Needs: n/a   Current Medications (01/17/2021):  This is the current hospital active medication list Current Facility-Administered Medications  Medication Dose Route Frequency Provider Last Rate Last Admin  . acetaminophen (TYLENOL) tablet 650 mg  650 mg Oral Q6H PRN 01/19/2021 N, DO      . albuterol (PROVENTIL) (2.5 MG/3ML) 0.083% nebulizer solution 3 mL  3 mL Nebulization Q6H PRN Dow Adolph N, DO      . aspirin chewable tablet 81 mg  81 mg Oral Daily Dow Adolph, MD   81 mg at 01/17/21 0841  . atorvastatin (LIPITOR) tablet 20 mg  20 mg Oral Daily 01/19/21 N, DO   20 mg at 01/17/21 0841  . dextrose 50 % solution 0-50 mL  0-50 mL Intravenous PRN 08-19-1974, MD      . divalproex (DEPAKOTE) DR tablet 500 mg  500 mg Oral BID Mancel Bale, MD   500 mg at  01/17/21 0841  . enoxaparin (LOVENOX) injection 40 mg  40 mg Subcutaneous Daily Dow Adolph N, DO   40 mg at 01/17/21 0841  . escitalopram (LEXAPRO) tablet 20 mg  20 mg Oral Daily Dow Adolph N, DO   20 mg at 01/17/21 0841  . gabapentin (NEURONTIN) capsule 300 mg  300 mg Oral BID Leroy Sea, MD   300 mg at 01/17/21 0841  . insulin aspart (novoLOG) injection 0-5 Units  0-5 Units Subcutaneous QHS Singh, Prashant K, MD      . insulin aspart (novoLOG) injection 0-9  Units  0-9 Units Subcutaneous TID WC Singh, Prashant K, MD      . insulin aspart (novoLOG) injection 2 Units  2 Units Subcutaneous TID WC Susa Raring K, MD      . insulin glargine (LANTUS) injection 20 Units  20 Units Subcutaneous Daily Leroy Sea, MD   20 Units at 01/17/21 0841  . melatonin tablet 3 mg  3 mg Oral QHS PRN Dow Adolph N, DO   3 mg at 01/15/21 2218  . montelukast (SINGULAIR) tablet 10 mg  10 mg Oral QHS Hall, Carole N, DO   10 mg at 01/16/21 2312  . ondansetron (ZOFRAN) injection 4 mg  4 mg Intravenous Q6H PRN Dow Adolph N, DO      . pantoprazole (PROTONIX) EC tablet 40 mg  40 mg Oral Daily Dow Adolph N, DO   40 mg at 01/17/21 4081     Discharge Medications: Please see discharge summary for a list of discharge medications.  Relevant Imaging Results:  Relevant Lab Results:   Additional Information SS# [id number]      patient has no vaccination record      patient has no payor source, is not eligible for medicaid per hospital screening and is not eligible for LOG.  Beckie Busing, RN

## 2021-01-17 NOTE — TOC Progression Note (Addendum)
Transition of Care [facility]) - Progression Note    Patient Details  Name: Carolyn Norton MRN: [id number] Date of Birth: 02/14/1966  Transition of [facility]) CM/SW Contact  Beckie Busing, RN Phone Number: [phone]  01/17/2021, 9:34 AM  Clinical Narrative:    TOC continues to follow patient that discharge has been cancelled due to families refusal to accept patinet back into the home where she has been living. Per MD Thedore Mins ( on 01/16/21) patient has capacity. CM at bedside with patient and patient is alert and oriented times 4. Patient verbalizes understanding of the situation. CM has explained to patient that staying at the hospital after medical clearance in not an option. Patient has been encouraged to speak with her parents to help determine a discharge plan. Patient is agreeable to rehab but CM has explained to patient that inpatient rehab is really not an option due to no payor source and being screened as ineligible for medicaid. FL2 has been completed and faxed out in an effort to seek placement but patient has been made aware that she will not received bed offer and should not depend on this as a discharge plan.TOC will continue to follow     1145 TOC supervisor Carolyn Band LCSW has sent message that she will follow up with [employer] to explore financial options. Also, recommendations have been suggested for psych input regarding bipolar diagnosis and capacity as extra support to potentially access funding for placement. TOC will continue to follow.     Expected Discharge Plan: (P) Home/Self Care Barriers to Discharge: Continued Medical Work up  Expected Discharge Plan and Services Expected Discharge Plan: (P) Home/Self Care   Discharge Planning Services: (P) CM Consult   Living arrangements for the past 2 months: (P) Single Family Home Expected Discharge Date: 01/16/21                                     Social Determinants of Health (SDOH) Interventions     Readmission Risk Interventions Readmission Risk Prevention Plan 01/14/2021  Transportation Screening Complete  PCP or Specialist Appt within 3-5 Days (No Data)  Social Work Consult for Recovery Care Planning/Counseling Complete  Palliative Care Screening Not Applicable  Medication Review Oceanographer) Referral to Pharmacy  Some recent data might be hidden

## 2021-01-18 DIAGNOSIS — F3176 Bipolar disorder, in full remission, most recent episode depressed: Secondary | ICD-10-CM

## 2021-01-18 LAB — GLUCOSE, CAPILLARY
Glucose-Capillary: 46 mg/dL — ABNORMAL LOW (ref 70–99)
Glucose-Capillary: 65 mg/dL — ABNORMAL LOW (ref 70–99)
Glucose-Capillary: 120 mg/dL — ABNORMAL HIGH (ref 70–99)
Glucose-Capillary: 319 mg/dL — ABNORMAL HIGH (ref 70–99)
Glucose-Capillary: 138 mg/dL — ABNORMAL HIGH (ref 70–99)
Glucose-Capillary: 138 mg/dL — ABNORMAL HIGH (ref 70–99)

## 2021-01-18 MED ORDER — INSULIN GLARGINE 100 UNIT/ML ~~LOC~~ SOLN
15.0000 [IU] | Freq: Every day | SUBCUTANEOUS | Status: DC
  Administered 2021-01-18 – 2021-01-19 (×2): 15 [IU] via SUBCUTANEOUS
  Filled 2021-01-18 (×2): qty 0.15

## 2021-01-18 NOTE — Progress Notes (Signed)
Mother and father came to visit patient. Stated she was working at [employer], and was completely competent before she had covid. She has been a drug user (crack) for years, but was able to keep a job and drive.   States she started shuffling her feet, falling, and became incontinent of bowel and bladder after covid.

## 2021-01-18 NOTE — Progress Notes (Signed)
Notified by tech of low blood sugar. Implemented hypoglycemia protocol and gave 8 oz of juice.  Will recheck FSBS in 15 mins

## 2021-01-18 NOTE — Progress Notes (Signed)
PROGRESS NOTE                                                                                                                                                                                                             Patient Demographics:    Carolyn Norton, is a 55 y.o. female, DOB - Jul 31, 1966, UEK:[id number]  Outpatient Primary MD for the patient is Carolyn Lodge, FNP    LOS - 6  Admit date - 01/11/2021    Chief Complaint  Patient presents with  . Generalized Weakness  . Multiple Falls  . Medication Noncompliance  . Altered Mental Status  . Hyperglycemia       Brief Narrative (HPI from H&P)  -  Carolyn Norton is a 55 y.o. female with medical history significant for DM2, bipolar disorder type 1, previous covid 19 infection, polysubstance abuse including cocaine, who presented from home where she lives with her parents due to generalized weakness, multiple falls and confusion, I called her mother and was informed that patient has been in very poor health with inability to walk without falling over for at least a month, she did her last dose of crack cocaine about a month ago and ever since then she has been not doing well, poor balance multiple falls, not taking her medications, sugars running high.  In the ER she was diagnosed with severe dehydration, elevated sugars, metabolic encephalopathy and she was admitted to the hospital.   Subjective:   Patient in bed, appears comfortable, denies any headache, no fever, no chest pain or pressure, no shortness of breath , no abdominal pain. No new focal weakness.   Assessment  & Plan :     1. Acute metabolic encephalopathy due to combination of severe dehydration, poorly controlled type 2 diabetes mellitus with hyperglycemia - she will be treated supportively with Lantus and sliding scale, hydrate with IV fluids, CT head is nonacute, she does not have any focal deficits,  much improved after supportive care continue to monitor.  Note patient was discharged on 01/16/2021 however she does not have a safe disposition as her mother refused to take her back home.  Patient is alert awake oriented and has the capacity to decide for herself, she is agreeable to placement at a rehab.  Social work requested to provide safe disposition.   2.  History of polysubstance abuse including crack cocaine abuse.  Counseled to quit.  3.  History of bipolar disorder.  Resumed home dose Escitalopram, Neurontin & Depakote will monitor.  No acute issues currently.  SNF requested psych input which we will obtain however initially appears to be stable from the standpoint.  Clearly has intact capacity to make her decisions.  4.  COPD.  Supportive care no acute issues.  5.  AKI on CKD 3A.  Baseline creatinine around 1.3.  AKI resolved after hydration with IV fluid.  6. GERD - PPI  7.  Hypokalemia and hypomagnesemia.  Replaced again we will continue to monitor.  8.  Dyslipidemia.  On home dose statin.  9. DM type II with hyperglycemia.  A1c of 11.6 suggesting poor glycemic control outpatient due to hyperglycemia, glucose control much improved less than half of home dose insulin sliding scale, questionable compliance, consulted DM educator, doses adjusted further 01/18/21.   Lab Results  Component Value Date   HGBA1C 11.0 (H) 01/12/2021   CBG (last 3)  Recent Labs    01/17/21 2101 01/18/21 0757 01/18/21 0822  GLUCAP 141* 46* 65*        Condition -  Fair  Family Communication  :  Mother Aram BeechamCynthia [phone] - 01/12/21, 01/16/21, Aunt 01/16/21  Code Status : Full  Consults  : Psych  PUD Prophylaxis : PPI   Procedures  :     CT Head - Non Acute      Disposition Plan  :     Dispo:  Patient From: Home  Planned Disposition: Home  Medically stable for discharge: No     DVT Prophylaxis  :    enoxaparin (LOVENOX) injection 40 mg Start: 01/12/21 1000     Lab Results   Component Value Date   PLT 114 (L) 01/16/2021    Diet :  Diet Order            Diet heart healthy/carb modified Room service appropriate? Yes; Fluid consistency: Thin  Diet effective now                  Inpatient Medications  Scheduled Meds: . aspirin  81 mg Oral Daily  . atorvastatin  20 mg Oral Daily  . divalproex  500 mg Oral BID  . enoxaparin (LOVENOX) injection  40 mg Subcutaneous Daily  . escitalopram  20 mg Oral Daily  . gabapentin  300 mg Oral BID  . insulin aspart  0-5 Units Subcutaneous QHS  . insulin aspart  0-9 Units Subcutaneous TID WC  . insulin glargine  15 Units Subcutaneous Daily  . montelukast  10 mg Oral QHS  . pantoprazole  40 mg Oral Daily   Continuous Infusions:  PRN Meds:.acetaminophen, albuterol, dextrose, melatonin, ondansetron (ZOFRAN) IV  Antibiotics  :    Anti-infectives (From admission, onward)   None       Time Spent in minutes  30   Susa RaringPrashant Joshaua Epple M.D on 01/18/2021 at 8:38 AM  To page go to [email or web address]   [facility] -  Office  [phone]    See all Orders from today for further details    Objective:   Vitals:   01/15/21 1956 01/17/21 1251 01/17/21 2124 01/18/21 0626  BP: 104/78 123/80 120/71 121/65  Pulse: 78 68 67 66  Resp: 17 18 18 16   Temp: 97.7 F (36.5 C) 98.2 F (36.8 C) 97.7 F (36.5 C) 97.7 F (36.5 C)  TempSrc: Oral Oral Oral Oral  SpO2: 95%  97% 97% 95%  Weight:      Height:        Wt Readings from Last 3 Encounters:  01/11/21 99.8 kg  11/21/20 85.5 kg  10/22/20 85.5 kg     Intake/Output Summary (Last 24 hours) at 01/18/2021 0838 Last data filed at 01/18/2021 0600 Gross per 24 hour  Intake --  Output 1650 ml  Net -1650 ml     Physical Exam  Awake Alert, No new F.N deficits, Normal affect [place].AT,[name] Supple Neck,No JVD, No cervical lymphadenopathy appriciated.  Symmetrical Chest wall movement, Good air movement bilaterally, CTAB RRR,No Gallops, Rubs or new Murmurs, No  Parasternal Heave +ve B.Sounds, Abd Soft, No tenderness, No organomegaly appriciated, No rebound - guarding or rigidity. No Cyanosis, Clubbing or edema, No new Rash or bruise     Data Review:    CBC Recent Labs  Lab 01/11/21 1227 01/11/21 1236 01/12/21 0232 01/13/21 0349 01/14/21 0238 01/15/21 0230 01/16/21 0201  WBC 14.1*  --  9.2 5.8 5.2 6.1 9.1  HGB 13.5   < > 12.6 12.2 11.9* 13.2 12.2  HCT 41.6   < > 39.1 36.2 36.2 40.3 37.3  PLT 175  --  137* 129* PLATELET CLUMPS NOTED ON SMEAR, UNABLE TO ESTIMATE 113* 114*  MCV 87.9  --  88.9 86.0 86.8 87.4 86.9  MCH 28.5  --  28.6 29.0 28.5 28.6 28.4  MCHC 32.5  --  32.2 33.7 32.9 32.8 32.7  RDW 13.5  --  13.9 13.9 14.2 14.4 14.7  LYMPHSABS 1.6  --   --  2.5 2.3 2.8 3.8  MONOABS 1.0  --   --  0.7 0.8 1.0 1.1*  EOSABS 0.0  --   --  0.1 0.1 0.1 0.2  BASOSABS 0.0  --   --  0.0 0.0 0.0 0.1   < > = values in this interval not displayed.    Recent Labs  Lab 01/11/21 1227 01/11/21 1236 01/12/21 0232 01/12/21 1403 01/13/21 0349 01/14/21 0238 01/15/21 0230 01/16/21 0201  NA 127*   < > 136  --  136 138 142 136  K 4.7   < > 5.3*  --  2.7* 3.4* 4.0 4.6  CL 88*  --  102  --  98 102 104 100  CO2 27  --  22  --  32 32 32 29  GLUCOSE 729*  --  249*  --  162* 137* 32* 135*  BUN 21*  --  20  --  CREATININE 1.59*  --  1.21*  --  1.06* 1.02* 1.06* 1.19*  CALCIUM 8.7*  --  8.1*  --  8.2* 8.2* 9.1 9.1  AST 11*  --   --   --  14* 13* 14* 20  ALT 8  --   --   --  ALKPHOS 85  --   --   --  35* 42 44 40  BILITOT 0.9  --   --   --  0.2* 0.2* 0.4 0.4  ALBUMIN 3.0*  --   --   --  2.2* 2.3* 2.6* 2.5*  MG  --   --  1.9  --  1.5* 1.9 1.8 1.7  PROCALCITON  --   --   --  0.15 0.11 <0.10  --   --   HGBA1C  --   --  11.0*  --   --   --   --   --  BNP  --   --   --   --  219.6* 177.8* 115.9* 40.9   < > = values in this interval not displayed.     ------------------------------------------------------------------------------------------------------------------ No results for input(s): CHOL, HDL, LDLCALC, TRIG, CHOLHDL, LDLDIRECT in the last 72 hours.  Lab Results  Component Value Date   HGBA1C 11.0 (H) 01/12/2021   ------------------------------------------------------------------------------------------------------------------ No results for input(s): TSH, T4TOTAL, T3FREE, THYROIDAB in the last 72 hours.  Invalid input(s): FREET3  Cardiac Enzymes No results for input(s): CKMB, TROPONINI, MYOGLOBIN in the last 168 hours.  Invalid input(s): CK ------------------------------------------------------------------------------------------------------------------    Component Value Date/Time   BNP 40.9 01/16/2021 0201    Micro Results Recent Results (from the past 240 hour(s))  Resp Panel by RT-PCR (Flu A&B, Covid) Nasopharyngeal Swab     Status: None   Collection Time: 01/11/21 12:35 PM   Specimen: Nasopharyngeal Swab; Nasopharyngeal(NP) swabs in vial transport medium  Result Value Ref Range Status   SARS Coronavirus 2 by RT PCR NEGATIVE NEGATIVE Final    Comment: (NOTE) SARS-CoV-2 target nucleic acids are NOT DETECTED.  The SARS-CoV-2 RNA is generally detectable in upper respiratory specimens during the acute phase of infection. The lowest concentration of SARS-CoV-2 viral copies this assay can detect is 138 copies/mL. A negative result does not preclude SARS-Cov-2 infection and should not be used as the sole basis for treatment or other patient management decisions. A negative result may occur with  improper specimen collection/handling, submission of specimen other than nasopharyngeal swab, presence of viral mutation(s) within the areas targeted by this assay, and inadequate number of viral copies(<138 copies/mL). A negative result must be combined with clinical observations, patient history, and  epidemiological information. The expected result is Negative.  Fact Sheet for Patients:  BloggerCourse.com  Fact Sheet for Healthcare Providers:  SeriousBroker.it  This test is no t yet approved or cleared by the [place] FDA and  has been authorized for detection and/or diagnosis of SARS-CoV-2 by FDA under an Emergency Use Authorization (EUA). This EUA will remain  in effect (meaning this test can be used) for the duration of the COVID-19 declaration under Section 564(b)(1) of the Act, 21 U.S.C.section 360bbb-3(b)(1), unless the authorization is terminated  or revoked sooner.       Influenza A by PCR NEGATIVE NEGATIVE Final   Influenza B by PCR NEGATIVE NEGATIVE Final    Comment: (NOTE) The Xpert Xpress SARS-CoV-2/FLU/RSV plus assay is intended as an aid in the diagnosis of influenza from Nasopharyngeal swab specimens and should not be used as a sole basis for treatment. Nasal washings and aspirates are unacceptable for Xpert Xpress SARS-CoV-2/FLU/RSV testing.  Fact Sheet for Patients: BloggerCourse.com  Fact Sheet for Healthcare Providers: SeriousBroker.it  This test is not yet approved or cleared by the [place] FDA and has been authorized for detection and/or diagnosis of SARS-CoV-2 by FDA under an Emergency Use Authorization (EUA). This EUA will remain in effect (meaning this test can be used) for the duration of the COVID-19 declaration under Section 564(b)(1) of the Act, 21 U.S.C. section 360bbb-3(b)(1), unless the authorization is terminated or revoked.  Performed at [facility], [address], [place], [place] [address]     Radiology Reports CT Head Wo Contrast  Result Date: 01/11/2021 CLINICAL DATA:  Mental status change.  Cause unknown. EXAM: CT HEAD WITHOUT CONTRAST TECHNIQUE: Contiguous axial images were obtained from the base of the skull  through the vertex without intravenous contrast. COMPARISON:  CT head  10/22/2020 FINDINGS: Brain: Similar-appearing prominence of the lateral ventricles may be related to central predominant atrophy, although a component of normal pressure/communicating hydrocephalus cannot be excluded. No evidence of large-territorial acute infarction. No parenchymal hemorrhage. No mass lesion. No extra-axial collection. Patchy and confluent areas of decreased attenuation are noted throughout the deep and periventricular white matter of the cerebral hemispheres bilaterally, compatible with chronic microvascular ischemic disease. Hypodensity along the right pulse cerebral unchanged. No mass effect or midline shift. No hydrocephalus. Basilar cisterns are patent. Vascular: No hyperdense vessel. Atherosclerotic calcifications are present within the cavernous internal carotid arteries. Skull: No acute fracture or focal lesion. Sinuses/Orbits: Paranasal sinuses and mastoid air cells are clear. The orbits are unremarkable. Other: None. IMPRESSION: 1. No acute intracranial abnormality. 2. Similar-appearing prominence of the lateral ventricles may be related to central predominant atrophy, although a component of normal pressure/communicating hydrocephalus cannot be excluded. Electronically Signed   By: Tish Frederickson M.D.   On: 01/11/2021 23:46   DG CHEST PORT 1 VIEW  Result Date: 01/11/2021 CLINICAL DATA:  Weakness EXAM: PORTABLE CHEST 1 VIEW COMPARISON:  10/22/2020 FINDINGS: Cardiac shadow is enlarged but accentuated by the portable technique. Aortic calcifications are noted. The lungs are clear. No bony abnormality is noted. IMPRESSION: No acute abnormality noted. Electronically Signed   By: Alcide Clever M.D.   On: 01/11/2021 23:42

## 2021-01-18 NOTE — Consult Note (Addendum)
[facility] Face-to-Face Psychiatry Consult   Reason for Consult: ''Bipolar disorder, appears stable, psych consult requested by SNF before accepting her.''  Referring Physician:  Susa Raring, MD Patient Identification: Carolyn Norton MRN:  [id number] Principal Diagnosis: Bipolar 1 disorder, depressed, full remission (HCC) Diagnosis:  Principal Problem:   Bipolar 1 disorder, depressed, full remission (HCC) Active Problems:   Bipolar 1 disorder (HCC)   Generalized weakness   Total Time spent with patient: 45 minutes  Subjective:   Carolyn Norton is a 55 y.o. female patient admitted with generalized weakness, multiple falls and confusion.  HPI:  55 y.o.female who reports  medical history significant forDM2, Bipolar disorder type 1, previous covid 19 infection and polysubstance abuse including cocaine. Patient was admitted due to genralized weakness, multiple falls and confusion. She reports that she was living at home with parents and has been having difficulty walking without falling for over a month. Patient reports that she abstained from using illicit substance for a long time but did a dose of crack cocaine in February hoping that it will help with her poor health. However, it made her health worse and she says she is determined never to use drug again. She is alert, awake, oriented to place, time , person and situation. Patient reports being stable mentally on her current medications. She denies depression, psychosis, delusions, mood swings, irritability and self harming thoughts. She is aware of her health needs and willing to accept any treatment recommendations that helps. Patient scored 29/30 on mini-mental status examination.  Past Psychiatric History: Bipolar disorder-stable  Risk to Self:  denies Risk to Others:  denies Prior Inpatient Therapy:  none Prior Outpatient Therapy:  a psychiatrist in [place]  Past Medical History:  Past Medical History:  Diagnosis Date   . Anxiety   . Asthma   . COVID    History of  . Depression   . Diabetes mellitus without complication (HCC)   . Hyperlipidemia   . Hypertension   . Seasonal allergies     Past Surgical History:  Procedure Laterality Date  . BREAST EXCISIONAL BIOPSY Right   . BREAST SURGERY     right breast   Family History:  Family History  Problem Relation Age of Onset  . Hypertension Mother   . Cancer Mother   . Kidney cancer Mother   . Cancer Father        prostate  . Breast cancer Paternal Grandmother   . Leukemia Brother    Family Psychiatric  History:  Social History:  Social History   Substance and Sexual Activity  Alcohol Use No     Social History   Substance and Sexual Activity  Drug Use Yes  . Types: Cocaine    Social History   Socioeconomic History  . Marital status: Single    Spouse name: Not on file  . Number of children: 0  . Years of education: Not on file  . Highest education level: High school graduate  Occupational History  . Not on file  Tobacco Use  . Smoking status: Current Every Day Smoker    Packs/day: 0.50    Types: Cigarettes  . Smokeless tobacco: Never Used  Vaping Use  . Vaping Use: Never used  Substance and Sexual Activity  . Alcohol use: No  . Drug use: Yes    Types: Cocaine  . Sexual activity: Not Currently    Birth control/protection: None  Other Topics Concern  . Not on file  Social History Narrative  .  Not on file   Social Determinants of Health   Financial Resource Strain: Not on file  Food Insecurity: Not on file  Transportation Needs: Not on file  Physical Activity: Not on file  Stress: Not on file  Social Connections: Not on file   Additional Social History:    Allergies:   Allergies  Allergen Reactions  . Erythromycin Other (See Comments)    Childhood reaction    Labs:  Results for orders placed or performed during the hospital encounter of 01/11/21 (from the past 48 hour(s))  Glucose, capillary      Status: Abnormal   Collection Time: 01/16/21  3:57 PM  Result Value Ref Range   Glucose-Capillary 145 (H) 70 - 99 mg/dL    Comment: Glucose reference range applies only to samples taken after fasting for at least 8 hours.  Glucose, capillary     Status: Abnormal   Collection Time: 01/16/21  7:13 PM  Result Value Ref Range   Glucose-Capillary 204 (H) 70 - 99 mg/dL    Comment: Glucose reference range applies only to samples taken after fasting for at least 8 hours.  Glucose, capillary     Status: Abnormal   Collection Time: 01/17/21  8:03 AM  Result Value Ref Range   Glucose-Capillary 173 (H) 70 - 99 mg/dL    Comment: Glucose reference range applies only to samples taken after fasting for at least 8 hours.  Glucose, capillary     Status: Abnormal   Collection Time: 01/17/21 12:06 PM  Result Value Ref Range   Glucose-Capillary 106 (H) 70 - 99 mg/dL    Comment: Glucose reference range applies only to samples taken after fasting for at least 8 hours.  Glucose, capillary     Status: Abnormal   Collection Time: 01/17/21  5:21 PM  Result Value Ref Range   Glucose-Capillary 142 (H) 70 - 99 mg/dL    Comment: Glucose reference range applies only to samples taken after fasting for at least 8 hours.  Glucose, capillary     Status: Abnormal   Collection Time: 01/17/21  9:01 PM  Result Value Ref Range   Glucose-Capillary 141 (H) 70 - 99 mg/dL    Comment: Glucose reference range applies only to samples taken after fasting for at least 8 hours.  Glucose, capillary     Status: Abnormal   Collection Time: 01/18/21  7:57 AM  Result Value Ref Range   Glucose-Capillary 46 (L) 70 - 99 mg/dL    Comment: Glucose reference range applies only to samples taken after fasting for at least 8 hours.  Glucose, capillary     Status: Abnormal   Collection Time: 01/18/21  8:22 AM  Result Value Ref Range   Glucose-Capillary 65 (L) 70 - 99 mg/dL    Comment: Glucose reference range applies only to samples taken  after fasting for at least 8 hours.  Glucose, capillary     Status: Abnormal   Collection Time: 01/18/21  8:40 AM  Result Value Ref Range   Glucose-Capillary 120 (H) 70 - 99 mg/dL    Comment: Glucose reference range applies only to samples taken after fasting for at least 8 hours.  Glucose, capillary     Status: Abnormal   Collection Time: 01/18/21 11:45 AM  Result Value Ref Range   Glucose-Capillary 319 (H) 70 - 99 mg/dL    Comment: Glucose reference range applies only to samples taken after fasting for at least 8 hours.    Current Facility-Administered Medications  Medication Dose Route Frequency Provider Last Rate Last Admin  . acetaminophen (TYLENOL) tablet 650 mg  650 mg Oral Q6H PRN Dow Adolph N, DO      . albuterol (PROVENTIL) (2.5 MG/3ML) 0.083% nebulizer solution 3 mL  3 mL Nebulization Q6H PRN Dow Adolph N, DO      . aspirin chewable tablet 81 mg  81 mg Oral Daily Leroy Sea, MD   81 mg at 01/18/21 0841  . atorvastatin (LIPITOR) tablet 20 mg  20 mg Oral Daily Dow Adolph N, DO   20 mg at 01/18/21 0841  . dextrose 50 % solution 0-50 mL  0-50 mL Intravenous PRN Mancel Bale, MD      . divalproex (DEPAKOTE) DR tablet 500 mg  500 mg Oral BID Leroy Sea, MD   500 mg at 01/18/21 0842  . enoxaparin (LOVENOX) injection 40 mg  40 mg Subcutaneous Daily Dow Adolph N, DO   40 mg at 01/18/21 0841  . escitalopram (LEXAPRO) tablet 20 mg  20 mg Oral Daily Dow Adolph N, DO   20 mg at 01/18/21 4098  . gabapentin (NEURONTIN) capsule 300 mg  300 mg Oral BID Leroy Sea, MD   300 mg at 01/18/21 0841  . insulin aspart (novoLOG) injection 0-5 Units  0-5 Units Subcutaneous QHS Susa Raring K, MD      . insulin aspart (novoLOG) injection 0-9 Units  0-9 Units Subcutaneous TID WC Leroy Sea, MD   7 Units at 01/18/21 1147  . insulin glargine (LANTUS) injection 15 Units  15 Units Subcutaneous Daily Leroy Sea, MD   15 Units at 01/18/21 0843  . melatonin tablet  3 mg  3 mg Oral QHS PRN Dow Adolph N, DO   3 mg at 01/15/21 2218  . montelukast (SINGULAIR) tablet 10 mg  10 mg Oral QHS Dow Adolph N, DO   10 mg at 01/17/21 2133  . ondansetron (ZOFRAN) injection 4 mg  4 mg Intravenous Q6H PRN Hall, Carole N, DO      . pantoprazole (PROTONIX) EC tablet 40 mg  40 mg Oral Daily Dow Adolph N, DO   40 mg at 01/18/21 1191    Musculoskeletal: Strength & Muscle Tone: not tested Gait & Station: not tested Patient leans: N/A  Psychiatric Specialty Exam:  Presentation  General Appearance: Appropriate for Environment; Well Groomed  Eye Contact:Good  Speech:Clear and Coherent; Normal Rate  Speech Volume:Normal  Handedness:Right   Mood and Affect  Mood:Euthymic  Affect:Appropriate; Congruent   Thought Process  Thought Processes:Coherent; Linear  Descriptions of Associations:No data recorded Orientation:Full (Time, Place and Person)  Thought Content:Logical  History of Schizophrenia/Schizoaffective disorder:No data recorded Duration of Psychotic Symptoms:No data recorded Hallucinations:Hallucinations: None  Ideas of Reference:None  Suicidal Thoughts:Suicidal Thoughts: No  Homicidal Thoughts:Homicidal Thoughts: No   Sensorium  Memory:Immediate Good; Recent Good; Remote Good  Judgment:Intact  Insight:Fair   Executive Functions  Concentration:Good  Attention Span:Good  Recall:Good  Fund of Knowledge:Good  Language:Good   Psychomotor Activity  Psychomotor Activity:Psychomotor Activity: Normal   Assets  Assets:Communication Skills; Desire for Improvement   Sleep  Sleep:Sleep: Good   Physical Exam: Physical Exam Psychiatric:        Attention and Perception: Attention and perception normal.        Mood and Affect: Mood and affect normal.        Speech: Speech normal.        Behavior: Behavior normal. Behavior is cooperative.  Thought Content: Thought content normal.        Cognition and Memory:  Cognition and memory normal.        Judgment: Judgment normal.    Review of Systems  Psychiatric/Behavioral: Negative.    Blood pressure 121/65, pulse 66, temperature 97.7 F (36.5 C), temperature source Oral, resp. rate 16, height 5\' 6"  (1.676 m), weight 99.8 kg, last menstrual period 01/26/2017, SpO2 95 %. Body mass index is 35.51 kg/m.  Treatment Plan Summary: 55 year old female with history of Bipolar disorder who is stable on her current medications. Today, she is alert, awake, oriented to place, time, person and situation. She is aware of her health needs and willing to accept any treatment recommendations that helps. Also, patient scored 29/30 on mini-mental status examination. Based on my evaluation today, patient is mentally stable on her medications and has capacity to make informed medical decision.  Recommendations: Continues Lexapro 20 mg daily, Depakote 500 mg bid and Gabapentin 300 mg twice daily for mood.   Disposition: No evidence of imminent risk to self or others at present.   Supportive therapy provided about ongoing stressors. Psychiatric service signing out. Re-consult as needed  Thedore MinsMojeed Kately Graffam, MD 01/18/2021 12:17 PM

## 2021-01-19 LAB — GLUCOSE, CAPILLARY
Glucose-Capillary: 111 mg/dL — ABNORMAL HIGH (ref 70–99)
Glucose-Capillary: 124 mg/dL — ABNORMAL HIGH (ref 70–99)
Glucose-Capillary: 162 mg/dL — ABNORMAL HIGH (ref 70–99)
Glucose-Capillary: 248 mg/dL — ABNORMAL HIGH (ref 70–99)
Glucose-Capillary: 131 mg/dL — ABNORMAL HIGH (ref 70–99)

## 2021-01-19 NOTE — Progress Notes (Signed)
PROGRESS NOTE                                                                                                                                                                                                             Patient Demographics:    Carolyn Norton, is a 55 y.o. female, DOB - 23-Oct-1965, WCB:[id number]  Outpatient Primary MD for the patient is [name], FNP    LOS - 7  Admit date - 01/11/2021    Chief Complaint  Patient presents with  . Generalized Weakness  . Multiple Falls  . Medication Noncompliance  . Altered Mental Status  . Hyperglycemia       Brief Narrative (HPI from H&P)  -  Carolyn Norton is a 55 y.o. female with medical history significant for DM2, bipolar disorder type 1, previous covid 19 infection, polysubstance abuse including cocaine, who presented from home where she lives with her parents due to generalized weakness, multiple falls and confusion, I called her mother and was informed that patient has been in very poor health with inability to walk without falling over for at least a month, she did her last dose of crack cocaine about a month ago and ever since then she has been not doing well, poor balance multiple falls, not taking her medications, sugars running high.  In the ER she was diagnosed with severe dehydration, elevated sugars, metabolic encephalopathy and she was admitted to the hospital.   Subjective:   Patient in bed, appears comfortable, denies any headache, no fever, no chest pain or pressure, no shortness of breath , no abdominal pain. No new focal weakness.    Assessment  & Plan :     1. Acute metabolic encephalopathy due to combination of severe dehydration, poorly controlled type 2 diabetes mellitus with hyperglycemia - she will be treated supportively with Lantus and sliding scale, hydrate with IV fluids, CT head is nonacute, she does not have any focal  deficits, much improved after supportive care continue to monitor.  Note patient was discharged on 01/16/2021 however she does not have a safe disposition as her mother refused to take her back home.  Patient is alert awake oriented and has the capacity to decide for herself, she is agreeable to placement at a rehab.  Social work requested to provide safe disposition.   2.  History of polysubstance abuse including crack cocaine abuse.  Counseled to quit.  3.  History of bipolar disorder.  Resumed home dose Escitalopram, Neurontin & Depakote will monitor.  No acute issues currently.  SNF requested psych input which we will obtain however initially appears to be stable from the standpoint.  Clearly has intact capacity to make her decisions. Cleared by Psych.  4.  COPD.  Supportive care no acute issues.  5.  AKI on CKD 3A.  Baseline creatinine around 1.3.  AKI resolved after hydration with IV fluid.  6. GERD - PPI  7.  Hypokalemia and hypomagnesemia.  Replaced again we will continue to monitor.  8.  Dyslipidemia.  On home dose statin.  9. DM type II with hyperglycemia.  A1c of 11.6 suggesting poor glycemic control outpatient due to hyperglycemia, glucose control much improved less than half of home dose insulin sliding scale, questionable compliance, consulted DM educator, doses adjusted further 01/18/21.   Lab Results  Component Value Date   HGBA1C 11.0 (H) 01/12/2021   CBG (last 3)  Recent Labs    01/18/21 2130 01/19/21 0203 01/19/21 0803  GLUCAP 138* 111* 124*        Condition -  Fair  Family Communication  :  Mother Aram BeechamCynthia [phone] - 01/12/21, 01/16/21, Aunt 01/16/21  Code Status : Full  Consults  : Psych  PUD Prophylaxis : PPI   Procedures  :     CT Head - Non Acute      Disposition Plan  :     Dispo:  Patient From: Home  Planned Disposition: Home  Medically stable for discharge: No     DVT Prophylaxis  :    enoxaparin (LOVENOX) injection 40 mg Start:  01/12/21 1000     Lab Results  Component Value Date   PLT 114 (L) 01/16/2021    Diet :  Diet Order            Diet heart healthy/carb modified Room service appropriate? Yes; Fluid consistency: Thin  Diet effective now                  Inpatient Medications  Scheduled Meds: . aspirin  81 mg Oral Daily  . atorvastatin  20 mg Oral Daily  . divalproex  500 mg Oral BID  . enoxaparin (LOVENOX) injection  40 mg Subcutaneous Daily  . escitalopram  20 mg Oral Daily  . gabapentin  300 mg Oral BID  . insulin aspart  0-5 Units Subcutaneous QHS  . insulin aspart  0-9 Units Subcutaneous TID WC  . insulin glargine  15 Units Subcutaneous Daily  . montelukast  10 mg Oral QHS  . pantoprazole  40 mg Oral Daily   Continuous Infusions:  PRN Meds:.acetaminophen, albuterol, dextrose, melatonin, ondansetron (ZOFRAN) IV  Antibiotics  :    Anti-infectives (From admission, onward)   None       Time Spent in minutes  30   Susa RaringPrashant Kaelin Holford M.D on 01/19/2021 at 9:51 AM  To page go to [email or web address]   [facility] -  Office  [phone]    See all Orders from today for further details    Objective:   Vitals:   01/17/21 2124 01/18/21 0626 01/18/21 2156 01/19/21 0602  BP: 120/71 121/65 107/74 127/79  Pulse: 67 66 73 61  Resp: 18 16 18 14   Temp: 97.7 F (36.5 C) 97.7 F (36.5 C) 98.2 F (36.8 C) 97.6 F (36.4 C)  TempSrc: Oral Oral Oral Oral  SpO2: 97% 95% 99% 98%  Weight:      Height:        Wt Readings from Last 3 Encounters:  01/11/21 99.8 kg  11/21/20 85.5 kg  10/22/20 85.5 kg    No intake or output data in the 24 hours ending 01/19/21 0951   Physical Exam  Awake Alert, No new F.N deficits, Normal affect [place].AT,[name] Supple Neck,No JVD, No cervical lymphadenopathy appriciated.  Symmetrical Chest wall movement, Good air movement bilaterally, CTAB RRR,No Gallops, Rubs or new Murmurs, No Parasternal Heave +ve B.Sounds, Abd Soft, No tenderness, No  organomegaly appriciated, No rebound - guarding or rigidity. No Cyanosis, Clubbing or edema, No new Rash or bruise    Data Review:    CBC Recent Labs  Lab 01/13/21 0349 01/14/21 0238 01/15/21 0230 01/16/21 0201  WBC 5.8 5.2 6.1 9.1  HGB 12.2 11.9* 13.2 12.2  HCT 36.2 36.2 40.3 37.3  PLT 129* PLATELET CLUMPS NOTED ON SMEAR, UNABLE TO ESTIMATE 113* 114*  MCV 86.0 86.8 87.4 86.9  MCH 29.0 28.5 28.6 28.4  MCHC 33.7 32.9 32.8 32.7  RDW 13.9 14.2 14.4 14.7  LYMPHSABS 2.5 2.3 2.8 3.8  MONOABS 0.7 0.8 1.0 1.1*  EOSABS 0.1 0.1 0.1 0.2  BASOSABS 0.0 0.0 0.0 0.1    Recent Labs  Lab 01/12/21 1403 01/13/21 0349 01/14/21 0238 01/15/21 0230 01/16/21 0201  NA  --  136 138 142 136  K  --  2.7* 3.4* 4.0 4.6  CL  --  98 102 104 100  CO2  --  32 32 32 29  GLUCOSE  --  162* 137* 32* 135*  BUN  --  12 8 7 12   CREATININE  --  1.06* 1.02* 1.06* 1.19*  CALCIUM  --  8.2* 8.2* 9.1 9.1  AST  --  14* 13* 14* 20  ALT  --  9 10 10 11   ALKPHOS  --  35* 42 44 40  BILITOT  --  0.2* 0.2* 0.4 0.4  ALBUMIN  --  2.2* 2.3* 2.6* 2.5*  MG  --  1.5* 1.9 1.8 1.7  PROCALCITON 0.15 0.11 <0.10  --   --   BNP  --  219.6* 177.8* 115.9* 40.9    ------------------------------------------------------------------------------------------------------------------ No results for input(s): CHOL, HDL, LDLCALC, TRIG, CHOLHDL, LDLDIRECT in the last 72 hours.  Lab Results  Component Value Date   HGBA1C 11.0 (H) 01/12/2021   ------------------------------------------------------------------------------------------------------------------ No results for input(s): TSH, T4TOTAL, T3FREE, THYROIDAB in the last 72 hours.  Invalid input(s): FREET3  Cardiac Enzymes No results for input(s): CKMB, TROPONINI, MYOGLOBIN in the last 168 hours.  Invalid input(s): CK ------------------------------------------------------------------------------------------------------------------    Component Value Date/Time   BNP 40.9  01/16/2021 0201    Micro Results Recent Results (from the past 240 hour(s))  Resp Panel by RT-PCR (Flu A&B, Covid) Nasopharyngeal Swab     Status: None   Collection Time: 01/11/21 12:35 PM   Specimen: Nasopharyngeal Swab; Nasopharyngeal(NP) swabs in vial transport medium  Result Value Ref Range Status   SARS Coronavirus 2 by RT PCR NEGATIVE NEGATIVE Final    Comment: (NOTE) SARS-CoV-2 target nucleic acids are NOT DETECTED.  The SARS-CoV-2 RNA is generally detectable in upper respiratory specimens during the acute phase of infection. The lowest concentration of SARS-CoV-2 viral copies this assay can detect is 138 copies/mL. A negative result does not preclude SARS-Cov-2 infection and should not be used as the sole basis for treatment or other patient management decisions. A negative result  may occur with  improper specimen collection/handling, submission of specimen other than nasopharyngeal swab, presence of viral mutation(s) within the areas targeted by this assay, and inadequate number of viral copies(<138 copies/mL). A negative result must be combined with clinical observations, patient history, and epidemiological information. The expected result is Negative.  Fact Sheet for Patients:  BloggerCourse.com  Fact Sheet for Healthcare Providers:  SeriousBroker.it  This test is no t yet approved or cleared by the [place] FDA and  has been authorized for detection and/or diagnosis of SARS-CoV-2 by FDA under an Emergency Use Authorization (EUA). This EUA will remain  in effect (meaning this test can be used) for the duration of the COVID-19 declaration under Section 564(b)(1) of the Act, 21 U.S.C.section 360bbb-3(b)(1), unless the authorization is terminated  or revoked sooner.       Influenza A by PCR NEGATIVE NEGATIVE Final   Influenza B by PCR NEGATIVE NEGATIVE Final    Comment: (NOTE) The Xpert Xpress  SARS-CoV-2/FLU/RSV plus assay is intended as an aid in the diagnosis of influenza from Nasopharyngeal swab specimens and should not be used as a sole basis for treatment. Nasal washings and aspirates are unacceptable for Xpert Xpress SARS-CoV-2/FLU/RSV testing.  Fact Sheet for Patients: BloggerCourse.com  Fact Sheet for Healthcare Providers: SeriousBroker.it  This test is not yet approved or cleared by the [place] FDA and has been authorized for detection and/or diagnosis of SARS-CoV-2 by FDA under an Emergency Use Authorization (EUA). This EUA will remain in effect (meaning this test can be used) for the duration of the COVID-19 declaration under Section 564(b)(1) of the Act, 21 U.S.C. section 360bbb-3(b)(1), unless the authorization is terminated or revoked.  Performed at [facility], [address] [place], [place] [address]     Radiology Reports  CT Head Wo Contrast  Result Date: 01/11/2021 CLINICAL DATA:  Mental status change.  Cause unknown. EXAM: CT HEAD WITHOUT CONTRAST TECHNIQUE: Contiguous axial images were obtained from the base of the skull through the vertex without intravenous contrast. COMPARISON:  CT head 10/22/2020 FINDINGS: Brain: Similar-appearing prominence of the lateral ventricles may be related to central predominant atrophy, although a component of normal pressure/communicating hydrocephalus cannot be excluded. No evidence of large-territorial acute infarction. No parenchymal hemorrhage. No mass lesion. No extra-axial collection. Patchy and confluent areas of decreased attenuation are noted throughout the deep and periventricular white matter of the cerebral hemispheres bilaterally, compatible with chronic microvascular ischemic disease. Hypodensity along the right pulse cerebral unchanged. No mass effect or midline shift. No hydrocephalus. Basilar cisterns are patent. Vascular: No hyperdense vessel.  Atherosclerotic calcifications are present within the cavernous internal carotid arteries. Skull: No acute fracture or focal lesion. Sinuses/Orbits: Paranasal sinuses and mastoid air cells are clear. The orbits are unremarkable. Other: None. IMPRESSION: 1. No acute intracranial abnormality. 2. Similar-appearing prominence of the lateral ventricles may be related to central predominant atrophy, although a component of normal pressure/communicating hydrocephalus cannot be excluded. Electronically Signed   By: Tish Frederickson M.D.   On: 01/11/2021 23:46   DG CHEST PORT 1 VIEW  Result Date: 01/11/2021 CLINICAL DATA:  Weakness EXAM: PORTABLE CHEST 1 VIEW COMPARISON:  10/22/2020 FINDINGS: Cardiac shadow is enlarged but accentuated by the portable technique. Aortic calcifications are noted. The lungs are clear. No bony abnormality is noted. IMPRESSION: No acute abnormality noted. Electronically Signed   By: Alcide Clever M.D.   On: 01/11/2021 23:42

## 2021-01-20 LAB — GLUCOSE, CAPILLARY
Glucose-Capillary: 47 mg/dL — ABNORMAL LOW (ref 70–99)
Glucose-Capillary: 299 mg/dL — ABNORMAL HIGH (ref 70–99)
Glucose-Capillary: 388 mg/dL — ABNORMAL HIGH (ref 70–99)
Glucose-Capillary: 223 mg/dL — ABNORMAL HIGH (ref 70–99)
Glucose-Capillary: 161 mg/dL — ABNORMAL HIGH (ref 70–99)
Glucose-Capillary: 31 mg/dL — CL (ref 70–99)

## 2021-01-20 MED ORDER — INSULIN GLARGINE 100 UNIT/ML ~~LOC~~ SOLN
14.0000 [IU] | Freq: Every day | SUBCUTANEOUS | Status: DC
  Filled 2021-01-20: qty 0.14

## 2021-01-20 MED ORDER — INSULIN ASPART 100 UNIT/ML IJ SOLN: 3.0000 [IU] | Freq: Three times a day (TID) | INTRAMUSCULAR | Status: DC

## 2021-01-20 NOTE — Progress Notes (Signed)
Pt blood sugar 31. Pt given iv dextrose. Recheck cbg 161. Will continue to monitor pt.

## 2021-01-20 NOTE — TOC Progression Note (Signed)
Transition of Care [facility]) - Progression Note    Patient Details  Name: Carolyn Norton MRN: [id number] Date of Birth: 01-02-1966  Transition of Care [facility]) CM/SW Contact  Beckie Busing, RN Phone Number: [phone]  01/20/2021, 2:35 PM  Clinical Narrative:    DME rolling walker and 3 in 1 ordered through charity with [employer]. DME to be delivered to the patients room. Currently no bed offers. TOC will continue to follow.   Expected Discharge Plan: (P) Home/Self Care Barriers to Discharge: Continued Medical Work up  Expected Discharge Plan and Services Expected Discharge Plan: (P) Home/Self Care   Discharge Planning Services: (P) CM Consult   Living arrangements for the past 2 months: (P) Single Family Home Expected Discharge Date: 01/16/21               DME Arranged: 3-N-1,Walker rolling DME Agency: [employer] Date DME Agency Contacted: 01/20/21 Time DME Agency Contacted: [phone] Representative spoke with at DME Agency: [name]             Social Determinants of Health (SDOH) Interventions    Readmission Risk Interventions Readmission Risk Prevention Plan 01/14/2021  Transportation Screening Complete  PCP or Specialist Appt within 3-5 Days (No Data)  Social Work Consult for Recovery Care Planning/Counseling Complete  Palliative Care Screening Not Applicable  Medication Review [employer]) Referral to Pharmacy  Some recent data might be hidden

## 2021-01-20 NOTE — Progress Notes (Signed)
PROGRESS NOTE                                                                                                                                                                                                             Patient Demographics:    Carolyn Norton, is a 55 y.o. female, DOB - 1965-09-14, [id number]:[id number]  Outpatient Primary MD for the patient is [name], FNP    LOS - 8  Admit date - 01/11/2021    Chief Complaint  Patient presents with  . Generalized Weakness  . Multiple Falls  . Medication Noncompliance  . Altered Mental Status  . Hyperglycemia       Brief Narrative (HPI from H&P)  -  Carolyn Norton is a 55 y.o. female with medical history significant for DM2, bipolar disorder type 1, previous covid 19 infection, polysubstance abuse including cocaine, who presented from home where she lives with her parents due to generalized weakness, multiple falls and confusion, I called her mother and was informed that patient has been in very poor health with inability to walk without falling over for at least a month, she did her last dose of crack cocaine about a month ago and ever since then she has been not doing well, poor balance multiple falls, not taking her medications, sugars running high.  In the ER she was diagnosed with severe dehydration, elevated sugars, metabolic encephalopathy and she was admitted to the hospital.   Subjective:   Patient in bed, appears comfortable, denies any headache, no fever, no chest pain or pressure, no shortness of breath , no abdominal pain. No new focal weakness.   Assessment  & Plan :     1. Acute metabolic encephalopathy due to combination of severe dehydration, poorly controlled type 2 diabetes mellitus with hyperglycemia - she will be treated supportively with Lantus and sliding scale, hydrate with IV fluids, CT head is nonacute, she does not have any focal deficits,  much improved after supportive care continue to monitor.  Note patient was discharged on 01/16/2021 however she does not have a safe disposition as her mother refused to take her back home.  Patient is alert awake oriented and has the capacity to decide for herself, she is agreeable to placement at a rehab.  Social work requested to provide safe disposition.   2.  History of polysubstance abuse including crack cocaine abuse.  Counseled to quit.  3.  History of bipolar disorder.  Resumed home dose Escitalopram, Neurontin & Depakote will monitor.  No acute issues currently.  SNF requested psych input which we will obtain however initially appears to be stable from the standpoint.  Clearly has intact capacity to make her decisions. Cleared by Psych.  4.  COPD.  Supportive care no acute issues.  5.  AKI on CKD 3A.  Baseline creatinine around 1.3.  AKI resolved after hydration with IV fluid.  6. GERD - PPI  7.  Hypokalemia and hypomagnesemia.  Replaced again we will continue to monitor.  8.  Dyslipidemia.  On home dose statin.  9. DM type II with hyperglycemia.  A1c of 11.6 suggesting poor glycemic control outpatient due to hyperglycemia, glucose control much improved less than half of home dose insulin sliding scale, questionable compliance, consulted DM educator, doses adjusted further 01/18/21.   Lab Results  Component Value Date   HGBA1C 11.0 (H) 01/12/2021   CBG (last 3)  Recent Labs    01/19/21 1631 01/19/21 2105 01/20/21 0751  GLUCAP 248* 131* 47*        Condition -  Fair  Family Communication  :  Mother [name] [phone] - 01/12/21, 01/16/21, Aunt 01/16/21  Code Status : Full  Consults  : Psych  PUD Prophylaxis : PPI   Procedures  :     CT Head - Non Acute      Disposition Plan  :     Dispo:  Patient From: Home  Planned Disposition: Home  Medically stable for discharge: No     DVT Prophylaxis  :    enoxaparin (LOVENOX) injection 40 mg Start: 01/12/21  1000     Lab Results  Component Value Date   PLT 114 (L) 01/16/2021    Diet :  Diet Order            Diet heart healthy/carb modified Room service appropriate? Yes; Fluid consistency: Thin  Diet effective now                  Inpatient Medications  Scheduled Meds: . aspirin  81 mg Oral Daily  . atorvastatin  20 mg Oral Daily  . divalproex  500 mg Oral BID  . enoxaparin (LOVENOX) injection  40 mg Subcutaneous Daily  . escitalopram  20 mg Oral Daily  . gabapentin  300 mg Oral BID  . insulin aspart  0-5 Units Subcutaneous QHS  . insulin aspart  0-9 Units Subcutaneous TID WC  . insulin glargine  15 Units Subcutaneous Daily  . montelukast  10 mg Oral QHS  . pantoprazole  40 mg Oral Daily   Continuous Infusions:  PRN Meds:.acetaminophen, albuterol, dextrose, melatonin, ondansetron (ZOFRAN) IV  Antibiotics  :    Anti-infectives (From admission, onward)   None       Time Spent in minutes  30   Susa Raring M.D on 01/20/2021 at 9:45 AM  To page go to [email or web address]   [facility] -  Office  [phone]    See all Orders from today for further details    Objective:   Vitals:   01/19/21 0602 01/19/21 1437 01/19/21 2200 01/20/21 0654  BP: 127/79 130/89 136/76 135/71  Pulse: 61 78 65 68  Resp: 14 16 16 16   Temp: 97.6 F (36.4 C) 97.6 F (36.4 C) 98 F (36.7 C) 97.6 F (36.4 C)  TempSrc: Oral  Oral Oral  SpO2: 98% 100% 99% 92%  Weight:      Height:        Wt Readings from Last 3 Encounters:  01/11/21 99.8 kg  11/21/20 85.5 kg  10/22/20 85.5 kg     Intake/Output Summary (Last 24 hours) at 01/20/2021 0945 Last data filed at 01/20/2021 0600 Gross per 24 hour  Intake --  Output 1200 ml  Net -1200 ml     Physical Exam  Awake Alert, No new F.N deficits, Normal affect [place].AT,[name] Supple Neck,No JVD, No cervical lymphadenopathy appriciated.  Symmetrical Chest wall movement, Good air movement bilaterally, CTAB RRR,No Gallops, Rubs or new  Murmurs, No Parasternal Heave +ve B.Sounds, Abd Soft, No tenderness, No organomegaly appriciated, No rebound - guarding or rigidity. No Cyanosis, Clubbing or edema, No new Rash or bruise    Data Review:    CBC Recent Labs  Lab 01/14/21 0238 01/15/21 0230 01/16/21 0201  WBC 5.2 6.1 9.1  HGB 11.9* 13.2 12.2  HCT 36.2 40.3 37.3  PLT PLATELET CLUMPS NOTED ON SMEAR, UNABLE TO ESTIMATE 113* 114*  MCV 86.8 87.4 86.9  MCH 28.5 28.6 28.4  MCHC 32.9 32.8 32.7  RDW 14.2 14.4 14.7  LYMPHSABS 2.3 2.8 3.8  MONOABS 0.8 1.0 1.1*  EOSABS 0.1 0.1 0.2  BASOSABS 0.0 0.0 0.1    Recent Labs  Lab 01/14/21 0238 01/15/21 0230 01/16/21 0201  NA 138 142 136  K 3.4* 4.0 4.6  CL 102 104 100  CO2 32 32 29  GLUCOSE 137* 32* 135*  BUN 8 7 12   CREATININE 1.02* 1.06* 1.19*  CALCIUM 8.2* 9.1 9.1  AST 13* 14* 20  ALT 10 10 11   ALKPHOS 42 44 40  BILITOT 0.2* 0.4 0.4  ALBUMIN 2.3* 2.6* 2.5*  MG 1.9 1.8 1.7  PROCALCITON <0.10  --   --   BNP 177.8* 115.9* 40.9    ------------------------------------------------------------------------------------------------------------------ No results for input(s): CHOL, HDL, LDLCALC, TRIG, CHOLHDL, LDLDIRECT in the last 72 hours.  Lab Results  Component Value Date   HGBA1C 11.0 (H) 01/12/2021   ------------------------------------------------------------------------------------------------------------------ No results for input(s): TSH, T4TOTAL, T3FREE, THYROIDAB in the last 72 hours.  Invalid input(s): FREET3  Cardiac Enzymes No results for input(s): CKMB, TROPONINI, MYOGLOBIN in the last 168 hours.  Invalid input(s): CK ------------------------------------------------------------------------------------------------------------------    Component Value Date/Time   BNP 40.9 01/16/2021 0201    Micro Results Recent Results (from the past 240 hour(s))  Resp Panel by RT-PCR (Flu A&B, Covid) Nasopharyngeal Swab     Status: None   Collection Time:  01/11/21 12:35 PM   Specimen: Nasopharyngeal Swab; Nasopharyngeal(NP) swabs in vial transport medium  Result Value Ref Range Status   SARS Coronavirus 2 by RT PCR NEGATIVE NEGATIVE Final    Comment: (NOTE) SARS-CoV-2 target nucleic acids are NOT DETECTED.  The SARS-CoV-2 RNA is generally detectable in upper respiratory specimens during the acute phase of infection. The lowest concentration of SARS-CoV-2 viral copies this assay can detect is 138 copies/mL. A negative result does not preclude SARS-Cov-2 infection and should not be used as the sole basis for treatment or other patient management decisions. A negative result may occur with  improper specimen collection/handling, submission of specimen other than nasopharyngeal swab, presence of viral mutation(s) within the areas targeted by this assay, and inadequate number of viral copies(<138 copies/mL). A negative result must be combined with clinical observations, patient history, and epidemiological information. The expected result is Negative.  Fact Sheet for Patients:  BloggerCourse.comhttps://www.fda.gov/media/152166/download  Fact  Sheet for Healthcare Providers:  SeriousBroker.it  This test is no t yet approved or cleared by the [place] FDA and  has been authorized for detection and/or diagnosis of SARS-CoV-2 by FDA under an Emergency Use Authorization (EUA). This EUA will remain  in effect (meaning this test can be used) for the duration of the COVID-19 declaration under Section 564(b)(1) of the Act, 21 U.S.C.section 360bbb-3(b)(1), unless the authorization is terminated  or revoked sooner.       Influenza A by PCR NEGATIVE NEGATIVE Final   Influenza B by PCR NEGATIVE NEGATIVE Final    Comment: (NOTE) The Xpert Xpress SARS-CoV-2/FLU/RSV plus assay is intended as an aid in the diagnosis of influenza from Nasopharyngeal swab specimens and should not be used as a sole basis for treatment. Nasal washings  and aspirates are unacceptable for Xpert Xpress SARS-CoV-2/FLU/RSV testing.  Fact Sheet for Patients: BloggerCourse.com  Fact Sheet for Healthcare Providers: SeriousBroker.it  This test is not yet approved or cleared by the [place] FDA and has been authorized for detection and/or diagnosis of SARS-CoV-2 by FDA under an Emergency Use Authorization (EUA). This EUA will remain in effect (meaning this test can be used) for the duration of the COVID-19 declaration under Section 564(b)(1) of the Act, 21 U.S.C. section 360bbb-3(b)(1), unless the authorization is terminated or revoked.  Performed at [facility], [address] [place], [place] [address]     Radiology Reports  CT Head Wo Contrast  Result Date: 01/11/2021 CLINICAL DATA:  Mental status change.  Cause unknown. EXAM: CT HEAD WITHOUT CONTRAST TECHNIQUE: Contiguous axial images were obtained from the base of the skull through the vertex without intravenous contrast. COMPARISON:  CT head 10/22/2020 FINDINGS: Brain: Similar-appearing prominence of the lateral ventricles may be related to central predominant atrophy, although a component of normal pressure/communicating hydrocephalus cannot be excluded. No evidence of large-territorial acute infarction. No parenchymal hemorrhage. No mass lesion. No extra-axial collection. Patchy and confluent areas of decreased attenuation are noted throughout the deep and periventricular white matter of the cerebral hemispheres bilaterally, compatible with chronic microvascular ischemic disease. Hypodensity along the right pulse cerebral unchanged. No mass effect or midline shift. No hydrocephalus. Basilar cisterns are patent. Vascular: No hyperdense vessel. Atherosclerotic calcifications are present within the cavernous internal carotid arteries. Skull: No acute fracture or focal lesion. Sinuses/Orbits: Paranasal sinuses and mastoid air cells  are clear. The orbits are unremarkable. Other: None. IMPRESSION: 1. No acute intracranial abnormality. 2. Similar-appearing prominence of the lateral ventricles may be related to central predominant atrophy, although a component of normal pressure/communicating hydrocephalus cannot be excluded. Electronically Signed   By: Tish Frederickson M.D.   On: 01/11/2021 23:46   DG CHEST PORT 1 VIEW  Result Date: 01/11/2021 CLINICAL DATA:  Weakness EXAM: PORTABLE CHEST 1 VIEW COMPARISON:  10/22/2020 FINDINGS: Cardiac shadow is enlarged but accentuated by the portable technique. Aortic calcifications are noted. The lungs are clear. No bony abnormality is noted. IMPRESSION: No acute abnormality noted. Electronically Signed   By: Alcide Clever M.D.   On: 01/11/2021 23:42

## 2021-01-20 NOTE — Progress Notes (Signed)
Occupational Therapy Treatment Patient Details Name: Carolyn Norton MRN: [id number] DOB: September 19, 1965 Today's Date: 01/20/2021    History of present illness Pt presents from home on 01/13/21 with multiple falls, confusion, and weakness. In ED blood glucose was 729 and pt was unable to ambulate.  Patient admitted with severe dehydration and metabolic encephalopathy. PMH: DM2, COPD, CKD3, GERD, bipolar disorder type 1, previous covid 19 infection, polysubstance abuse including cocaine.   OT comments  Pt making steady progress towards OT goals this session. Pt continues to present with impaired safety awareness,  and impaired cognition ( STM deficits). Pt completed full wash up at sink needing most assist for safety awareness as pt noted to need assist sequencing through LB ADLS and to problem solve through ADLS. Pt noted to have some STM deficits asking repeated questions during session. Plan to increase pts therapy frequency in an effort to get pt back home to a level pts parents would be comfortable with them ( plan to have phone with pts mother this afternoon) pt agreeable to increasing therapy frequency, will follow acutely per POC.    Follow Up Recommendations  Supervision/Assistance - 24 hour;Home health OT    Equipment Recommendations  3 in 1 bedside commode;Other (comment) (RW)    Recommendations for Other Services      Precautions / Restrictions Precautions Precautions: Fall Restrictions Weight Bearing Restrictions: No       Mobility Bed Mobility Overal bed mobility: Needs Assistance Bed Mobility: Supine to Sit     Supine to sit: Supervision;HOB elevated     General bed mobility comments: supervision for safety    Transfers Overall transfer level: Needs assistance Equipment used: Rolling walker (2 wheeled) Transfers: Sit to/from Stand Sit to Stand: Min guard         General transfer comment: min guard for multiple stands during session, cues for hand  placement each trial    Balance Overall balance assessment: Needs assistance;History of Falls Sitting-balance support: No upper extremity supported;Feet supported Sitting balance-Leahy Scale: Poor Sitting balance - Comments: LOB when reaching out of BOS for LB ADLs   Standing balance support: No upper extremity supported;During functional activity Standing balance-Leahy Scale: Fair Standing balance comment: pt able to stand at sink to complete ADLs with no UE support with close supervision                           ADL either performed or assessed with clinical judgement   ADL Overall ADL's : Needs assistance/impaired     Grooming: Wash/dry face;Oral care;Standing;Supervision/safety;Set up Grooming Details (indicate cue type and reason): cues to sequence to next task as pt perseverates on one task Upper Body Bathing: Sitting;Moderate assistance   Lower Body Bathing: Sitting/lateral leans;Min guard Lower Body Bathing Details (indicate cue type and reason): washing only top of legs from sitting in chair Upper Body Dressing : Minimal assistance;Sitting   Lower Body Dressing: Moderate assistance;Sitting/lateral leans Lower Body Dressing Details (indicate cue type and reason): to don LLE into underwear, cues needed to problem solve safe strategy to don LLE into underwear as pt nearly falling out of chair when reaching out of BOS to don underwear, cues for figure four technique.  pt with no awareness to only having one sock on needing cues to don sock on L foot, cues needed to sequence task as pt attempting to don sock starting at pts heel Toilet Transfer: Min guard;Ambulation;RW Toilet Transfer Details (indicate cue type and  reason): simulated via functional mobility from EOB>sink>recliner         Functional mobility during ADLs: Min guard;Rolling walker General ADL Comments: pt continues to present with impaired safety awareness,  and impaired cognition ( STM deficits). pt  able to complete full wash up at sink.     Vision       Perception     Praxis      Cognition Arousal/Alertness: Awake/alert Behavior During Therapy: WFL for tasks assessed/performed Overall Cognitive Status: Impaired/Different from baseline Area of Impairment: Attention;Memory;Following commands;Safety/judgement;Awareness;Problem solving                   Current Attention Level: Selective Memory: Decreased short-term memory (asking what time it is numerous times) Following Commands: Follows one step commands with increased time;Follows multi-step commands inconsistently Safety/Judgement: Decreased awareness of safety;Decreased awareness of deficits Awareness: Intellectual Problem Solving: Slow processing;Difficulty sequencing;Requires verbal cues;Decreased initiation General Comments: pt needed cues to sequence donning sock on LLE, no awareness to only wearing one sock when attempting to sit<>stand, pt asking to call her grandpa but then later states that he died.        Exercises     Shoulder Instructions       General Comments no skin integrity issues noted    Pertinent Vitals/ Pain       Pain Assessment: No/denies pain  Home Living                                          Prior Functioning/Environment              Frequency  Min 2X/week        Progress Toward Goals  OT Goals(current goals can now be found in the care plan section)  Progress towards OT goals: Progressing toward goals  Acute Rehab OT Goals Patient Stated Goal: to get a job and her niece and nephew OT Goal Formulation: With patient Time For Goal Achievement: 01/27/21 Potential to Achieve Goals: Good  Plan Discharge plan remains appropriate;Frequency remains appropriate    Co-evaluation                 AM-PAC OT "6 Clicks" Daily Activity     Outcome Measure   Help from another person eating meals?: None Help from another person taking care of  personal grooming?: A Little Help from another person toileting, which includes using toliet, bedpan, or urinal?: A Little Help from another person bathing (including washing, rinsing, drying)?: A Little Help from another person to put on and taking off regular upper body clothing?: A Little Help from another person to put on and taking off regular lower body clothing?: A Little 6 Click Score: 19    End of Session Equipment Utilized During Treatment: Rolling walker  OT Visit Diagnosis: Unsteadiness on feet (R26.81);Other abnormalities of gait and mobility (R26.89);Muscle weakness (generalized) (M62.81)   Activity Tolerance Patient tolerated treatment well   Patient Left in chair;with call bell/phone within reach;with chair alarm set   Nurse Communication Mobility status        Time: 6568-1275 OT Time Calculation (min): 64 min  Charges: OT General Charges $OT Visit: 1 Visit OT Treatments $Self Care/Home Management : 53-67 mins  Lenor Derrick., COTA/L Acute Rehabilitation Services [phone]    Barron Schmid 01/20/2021, 10:19 AM

## 2021-01-20 NOTE — Progress Notes (Signed)
Physical Therapy Treatment Patient Details Name: Carolyn Norton MRN: [id number] DOB: 07-16-1966 Today's Date: 01/20/2021    History of Present Illness Pt presents from home on 01/13/21 with multiple falls, confusion, and weakness. In ED blood glucose was 729 and pt was unable to ambulate.  Patient admitted with severe dehydration and metabolic encephalopathy. PMH: DM2, COPD, CKD3, GERD, bipolar disorder type 1, previous covid 19 infection, polysubstance abuse including cocaine.    PT Comments    Pt making slow, steady progress. Continues to have significant balance and cognitive deficits. The busier the environment the poorer the quality of her mobility. At this time she continues to require assist for mobility.    Follow Up Recommendations  SNF;Supervision/Assistance - 24 hour     Equipment Recommendations  Rolling walker with 5" wheels;3in1 (PT)    Recommendations for Other Services       Precautions / Restrictions Precautions Precautions: Fall    Mobility  Bed Mobility               General bed mobility comments: Pt up in recliner    Transfers Overall transfer level: Needs assistance Equipment used: Rolling walker (2 wheeled) Transfers: Sit to/from Stand Sit to Stand: Min guard;Min assist         General transfer comment: Stood multiple times from recliner and one time from toilet. Verbal cues for hand placement each time. On some trials min assist due to posterior bias.  Ambulation/Gait Ambulation/Gait assistance: Min assist Gait Distance (Feet): 125 Feet Assistive device: Rolling walker (2 wheeled) Gait Pattern/deviations: Step-to pattern;Step-through pattern;Decreased step length - right;Decreased step length - left;Shuffle;Trunk flexed Gait velocity: decr Gait velocity interpretation: <1.31 ft/sec, indicative of household ambulator General Gait Details: Assist for balance and support and maximum multimodal cuing to stay closer to the walker, stand  more erect and incr step length. Pt very distracted in hallway and needed almost constant cues to attend to task   Stairs             Wheelchair Mobility    Modified Rankin (Stroke Patients Only)       Balance Overall balance assessment: Needs assistance;History of Falls Sitting-balance support: No upper extremity supported;Feet supported Sitting balance-Leahy Scale: Poor Sitting balance - Comments: Pt leaning posterioly and to the left sitting edge of chair   Standing balance support: No upper extremity supported;During functional activity;Single extremity supported Standing balance-Leahy Scale: Poor Standing balance comment: attempted to stand without UE support and pt only able to perform 1-2 seconds before needing UE support or min assist for balance. Verbal cues and visual cues using mirror to correct lt lean                            Cognition Arousal/Alertness: Awake/alert Behavior During Therapy: WFL for tasks assessed/performed Overall Cognitive Status: Impaired/Different from baseline Area of Impairment: Attention;Memory;Following commands;Safety/judgement;Awareness;Problem solving                   Current Attention Level: Sustained Memory: Decreased short-term memory Following Commands: Follows one step commands with increased time;Follows multi-step commands inconsistently Safety/Judgement: Decreased awareness of safety;Decreased awareness of deficits Awareness: Intellectual Problem Solving: Slow processing;Difficulty sequencing;Requires verbal cues;Decreased initiation General Comments: In hallway pt needed almost constant cuing to stay on task      Exercises Other Exercises Other Exercises: performed muliple attempts at unsupported standing with and without the use of the mirror    General Comments  Pertinent Vitals/Pain Pain Assessment: No/denies pain    Home Living                      Prior Function             PT Goals (current goals can now be found in the care plan section) Acute Rehab PT Goals Patient Stated Goal: to get a job and see her niece and nephew Progress towards PT goals: Progressing toward goals    Frequency    Min 5X/week      PT Plan Current plan remains appropriate;Frequency needs to be updated    Co-evaluation              AM-PAC PT "6 Clicks" Mobility   Outcome Measure  Help needed turning from your back to your side while in a flat bed without using bedrails?: None Help needed moving from lying on your back to sitting on the side of a flat bed without using bedrails?: A Little Help needed moving to and from a bed to a chair (including a wheelchair)?: A Little Help needed standing up from a chair using your arms (e.g., wheelchair or bedside chair)?: A Little Help needed to walk in hospital room?: A Little Help needed climbing 3-5 steps with a railing? : Total 6 Click Score: 17    End of Session Equipment Utilized During Treatment: Gait belt Activity Tolerance: Patient tolerated treatment well Patient left: in chair;with call bell/phone within reach;with chair alarm set   PT Visit Diagnosis: Muscle weakness (generalized) (M62.81);Repeated falls (R29.6);Difficulty in walking, not elsewhere classified (R26.2);Unsteadiness on feet (R26.81)     Time: 5188-4166 PT Time Calculation (min) (ACUTE ONLY): 25 min  Charges:  $Gait Training: 8-22 mins $Therapeutic Activity: 8-22 mins                     [facility] PT Acute Rehabilitation Services Pager [phone] Office [phone]    Angelina Ok [facility] 01/20/2021, 3:01 PM

## 2021-01-21 LAB — CBC
HCT: 39.8 % (ref 36.0–46.0)
Hemoglobin: 13.3 g/dL (ref 12.0–15.0)
MCH: 29.1 pg (ref 26.0–34.0)
MCHC: 33.4 g/dL (ref 30.0–36.0)
MCV: 87.1 fL (ref 80.0–100.0)
Platelets: 185 10*3/uL (ref 150–400)
RBC: 4.57 MIL/uL (ref 3.87–5.11)
RDW: 14.6 % (ref 11.5–15.5)
WBC: 5.9 10*3/uL (ref 4.0–10.5)
nRBC: 0 % (ref 0.0–0.2)

## 2021-01-21 LAB — BASIC METABOLIC PANEL
Anion gap: 9 (ref 5–15)
BUN: 22 mg/dL — ABNORMAL HIGH (ref 6–20)
CO2: 30 mmol/L (ref 22–32)
Calcium: 9.3 mg/dL (ref 8.9–10.3)
Chloride: 96 mmol/L — ABNORMAL LOW (ref 98–111)
Creatinine, Ser: 1.12 mg/dL — ABNORMAL HIGH (ref 0.44–1.00)
GFR, Estimated: 59 mL/min — ABNORMAL LOW (ref >60)
Glucose, Bld: 96 mg/dL (ref 70–99)
Potassium: 4 mmol/L (ref 3.5–5.1)
Sodium: 135 mmol/L (ref 135–145)

## 2021-01-21 LAB — GLUCOSE, CAPILLARY
Glucose-Capillary: 107 mg/dL — ABNORMAL HIGH (ref 70–99)
Glucose-Capillary: 125 mg/dL — ABNORMAL HIGH (ref 70–99)
Glucose-Capillary: 219 mg/dL — ABNORMAL HIGH (ref 70–99)
Glucose-Capillary: 240 mg/dL — ABNORMAL HIGH (ref 70–99)
Glucose-Capillary: 221 mg/dL — ABNORMAL HIGH (ref 70–99)
Glucose-Capillary: 141 mg/dL — ABNORMAL HIGH (ref 70–99)

## 2021-01-21 LAB — MAGNESIUM: Magnesium: 1.9 mg/dL (ref 1.7–2.4)

## 2021-01-21 MED ORDER — INSULIN ASPART 100 UNIT/ML IJ SOLN
3.0000 [IU] | Freq: Two times a day (BID) | INTRAMUSCULAR | Status: DC
  Administered 2021-01-21 – 2021-01-22 (×2): 3 [IU] via SUBCUTANEOUS

## 2021-01-21 MED ORDER — INSULIN GLARGINE 100 UNIT/ML ~~LOC~~ SOLN
12.0000 [IU] | Freq: Every day | SUBCUTANEOUS | Status: DC
  Administered 2021-01-21 – 2021-01-22 (×2): 12 [IU] via SUBCUTANEOUS
  Filled 2021-01-21 (×2): qty 0.12

## 2021-01-21 NOTE — Progress Notes (Signed)
PROGRESS NOTE                                                                                                                                                                                                             Patient Demographics:    Carolyn Norton, is a 55 y.o. female, DOB - Nov 11, 1965, ZOX:[id number]  Outpatient Primary MD for the patient is Dartha LodgeSteele, Anthony, FNP    LOS - 9  Admit date - 01/11/2021    Chief Complaint  Patient presents with  . Generalized Weakness  . Multiple Falls  . Medication Noncompliance  . Altered Mental Status  . Hyperglycemia       Brief Narrative (HPI from H&P)  -  Carolyn Norton is a 55 y.o. female with medical history significant for DM2, bipolar disorder type 1, previous covid 19 infection, polysubstance abuse including cocaine, who presented from home where she lives with her parents due to generalized weakness, multiple falls and confusion, I called her mother and was informed that patient has been in very poor health with inability to walk without falling over for at least a month, she did her last dose of crack cocaine about a month ago and ever since then she has been not doing well, poor balance multiple falls, not taking her medications, sugars running high.  In the ER she was diagnosed with severe dehydration, elevated sugars, metabolic encephalopathy and she was admitted to the hospital.   Subjective:   Patient in bed, appears comfortable, denies any headache, no fever, no chest pain or pressure, no shortness of breath , no abdominal pain. No new focal weakness.   Assessment  & Plan :     1. Acute metabolic encephalopathy due to combination of severe dehydration, poorly controlled type 2 diabetes mellitus with hyperglycemia - she will be treated supportively with Lantus and sliding scale, hydrate with IV fluids, CT head is nonacute, she does not have any focal deficits,  much improved after supportive care continue to monitor.  Note patient was discharged on 01/16/2021 however she does not have a safe disposition as her mother refused to take her back home.  Patient is alert awake oriented and has the capacity to decide for herself, she is agreeable to placement at a rehab.  Social work requested to provide safe disposition.   2.  History of polysubstance abuse including crack cocaine abuse.  Counseled to quit.  3.  History of bipolar disorder.  Resumed home dose Escitalopram, Neurontin & Depakote will monitor.  No acute issues currently.  SNF requested psych input which we will obtain however initially appears to be stable from the standpoint.  Clearly has intact capacity to make her decisions. Cleared by Psych.  4.  COPD.  Supportive care no acute issues.  5.  AKI on CKD 3A.  Baseline creatinine around 1.3.  AKI resolved after hydration with IV fluid.  6. GERD - PPI  7.  Hypokalemia and hypomagnesemia.  Replaced again we will continue to monitor.  8.  Dyslipidemia.  On home dose statin.  9. DM type II with hyperglycemia.  A1c of 11.6 suggesting poor glycemic control outpatient due to hyperglycemia, she is on less than half home dose insulin, still having early morning hypoglycemic events, nighttime snack ordered, no insulin after 5 PM.  Lantus dropped further, will monitor.   Lab Results  Component Value Date   HGBA1C 11.0 (H) 01/12/2021   CBG (last 3)  Recent Labs    01/20/21 2049 01/21/21 0616 01/21/21 0738  GLUCAP 161* 107* 125*        Condition -  Fair  Family Communication  :  Mother [name] [phone] - 01/12/21, 01/16/21, Aunt 01/16/21  Code Status : Full  Consults  : Psych  PUD Prophylaxis : PPI   Procedures  :     CT Head - Non Acute      Disposition Plan  :     Dispo:  Patient From: Home  Planned Disposition: Home  Medically stable for discharge: No     DVT Prophylaxis  :    enoxaparin (LOVENOX) injection 40 mg  Start: 01/12/21 1000     Lab Results  Component Value Date   PLT 185 01/21/2021    Diet :  Diet Order            Diet Carb Modified Fluid consistency: Thin; Room service appropriate? Yes  Diet effective now                  Inpatient Medications  Scheduled Meds: . aspirin  81 mg Oral Daily  . atorvastatin  20 mg Oral Daily  . divalproex  500 mg Oral BID  . enoxaparin (LOVENOX) injection  40 mg Subcutaneous Daily  . escitalopram  20 mg Oral Daily  . gabapentin  300 mg Oral BID  . insulin aspart  0-9 Units Subcutaneous TID WC  . insulin aspart  3 Units Subcutaneous BID WC  . insulin glargine  12 Units Subcutaneous Daily  . montelukast  10 mg Oral QHS  . pantoprazole  40 mg Oral Daily   Continuous Infusions:  PRN Meds:.acetaminophen, albuterol, dextrose, melatonin, ondansetron (ZOFRAN) IV  Antibiotics  :    Anti-infectives (From admission, onward)   None       Time Spent in minutes  30   Susa Raring M.D on 01/21/2021 at 8:30 AM  To page go to [email or web address]   [facility] -  Office  [phone]    See all Orders from today for further details    Objective:   Vitals:   01/19/21 2200 01/20/21 0654 01/20/21 1410 01/20/21 2032  BP: 136/76 135/71 93/65 116/61  Pulse: 65 68  72  Resp: 16 16 18 18   Temp: 98 F (36.7 C) 97.6 F (36.4 C) 97.8 F (36.6 C) 97.6 F (36.4 C)  TempSrc: Oral Oral Oral Oral  SpO2: 99% 92% 98% 93%  Weight:      Height:        Wt Readings from Last 3 Encounters:  01/11/21 99.8 kg  11/21/20 85.5 kg  10/22/20 85.5 kg     Intake/Output Summary (Last 24 hours) at 01/21/2021 0830 Last data filed at 01/21/2021 0745 Gross per 24 hour  Intake --  Output 1000 ml  Net -1000 ml     Physical Exam  Awake Alert, No new F.N deficits, Normal affect [place].AT,[name] Supple Neck,No JVD, No cervical lymphadenopathy appriciated.  Symmetrical Chest wall movement, Good air movement bilaterally, CTAB RRR,No Gallops, Rubs or new  Murmurs, No Parasternal Heave +ve B.Sounds, Abd Soft, No tenderness, No organomegaly appriciated, No rebound - guarding or rigidity. No Cyanosis, Clubbing or edema, No new Rash or bruise     Data Review:    CBC Recent Labs  Lab 01/15/21 0230 01/16/21 0201 01/21/21 0335  WBC 6.1 9.1 5.9  HGB 13.2 12.2 13.3  HCT 40.3 37.3 39.8  PLT 113* 114* 185  MCV 87.4 86.9 87.1  MCH 28.6 28.4 29.1  MCHC 32.8 32.7 33.4  RDW 14.4 14.7 14.6  LYMPHSABS 2.8 3.8  --   MONOABS 1.0 1.1*  --   EOSABS 0.1 0.2  --   BASOSABS 0.0 0.1  --     Recent Labs  Lab 01/15/21 0230 01/16/21 0201 01/21/21 0335  NA 142 136 135  K 4.0 4.6 4.0  CL 104 100 96*  CO2 32 29 30  GLUCOSE 32* 135* 96  BUN 7 12 22*  CREATININE 1.06* 1.19* 1.12*  CALCIUM 9.1 9.1 9.3  AST 14* 20  --   ALT 10 11  --   ALKPHOS 44 40  --   BILITOT 0.4 0.4  --   ALBUMIN 2.6* 2.5*  --   MG 1.8 1.7 1.9  BNP 115.9* 40.9  --     ------------------------------------------------------------------------------------------------------------------ No results for input(s): CHOL, HDL, LDLCALC, TRIG, CHOLHDL, LDLDIRECT in the last 72 hours.  Lab Results  Component Value Date   HGBA1C 11.0 (H) 01/12/2021   ------------------------------------------------------------------------------------------------------------------ No results for input(s): TSH, T4TOTAL, T3FREE, THYROIDAB in the last 72 hours.  Invalid input(s): FREET3  Cardiac Enzymes No results for input(s): CKMB, TROPONINI, MYOGLOBIN in the last 168 hours.  Invalid input(s): CK ------------------------------------------------------------------------------------------------------------------    Component Value Date/Time   BNP 40.9 01/16/2021 0201    Micro Results Recent Results (from the past 240 hour(s))  Resp Panel by RT-PCR (Flu A&B, Covid) Nasopharyngeal Swab     Status: None   Collection Time: 01/11/21 12:35 PM   Specimen: Nasopharyngeal Swab; Nasopharyngeal(NP)  swabs in vial transport medium  Result Value Ref Range Status   SARS Coronavirus 2 by RT PCR NEGATIVE NEGATIVE Final    Comment: (NOTE) SARS-CoV-2 target nucleic acids are NOT DETECTED.  The SARS-CoV-2 RNA is generally detectable in upper respiratory specimens during the acute phase of infection. The lowest concentration of SARS-CoV-2 viral copies this assay can detect is 138 copies/mL. A negative result does not preclude SARS-Cov-2 infection and should not be used as the sole basis for treatment or other patient management decisions. A negative result may occur with  improper specimen collection/handling, submission of specimen other than nasopharyngeal swab, presence of viral mutation(s) within the areas targeted by this assay, and inadequate number of viral copies(<138 copies/mL). A negative result must be combined with clinical observations, patient history, and epidemiological information. The expected result  is Negative.  Fact Sheet for Patients:  BloggerCourse.com  Fact Sheet for Healthcare Providers:  SeriousBroker.it  This test is no t yet approved or cleared by the [place] FDA and  has been authorized for detection and/or diagnosis of SARS-CoV-2 by FDA under an Emergency Use Authorization (EUA). This EUA will remain  in effect (meaning this test can be used) for the duration of the COVID-19 declaration under Section 564(b)(1) of the Act, 21 U.S.C.section 360bbb-3(b)(1), unless the authorization is terminated  or revoked sooner.       Influenza A by PCR NEGATIVE NEGATIVE Final   Influenza B by PCR NEGATIVE NEGATIVE Final    Comment: (NOTE) The Xpert Xpress SARS-CoV-2/FLU/RSV plus assay is intended as an aid in the diagnosis of influenza from Nasopharyngeal swab specimens and should not be used as a sole basis for treatment. Nasal washings and aspirates are unacceptable for Xpert Xpress  SARS-CoV-2/FLU/RSV testing.  Fact Sheet for Patients: BloggerCourse.com  Fact Sheet for Healthcare Providers: SeriousBroker.it  This test is not yet approved or cleared by the [place] FDA and has been authorized for detection and/or diagnosis of SARS-CoV-2 by FDA under an Emergency Use Authorization (EUA). This EUA will remain in effect (meaning this test can be used) for the duration of the COVID-19 declaration under Section 564(b)(1) of the Act, 21 U.S.C. section 360bbb-3(b)(1), unless the authorization is terminated or revoked.  Performed at [facility], [address] [place], [place] [address]     Radiology Reports  CT Head Wo Contrast  Result Date: 01/11/2021 CLINICAL DATA:  Mental status change.  Cause unknown. EXAM: CT HEAD WITHOUT CONTRAST TECHNIQUE: Contiguous axial images were obtained from the base of the skull through the vertex without intravenous contrast. COMPARISON:  CT head 10/22/2020 FINDINGS: Brain: Similar-appearing prominence of the lateral ventricles may be related to central predominant atrophy, although a component of normal pressure/communicating hydrocephalus cannot be excluded. No evidence of large-territorial acute infarction. No parenchymal hemorrhage. No mass lesion. No extra-axial collection. Patchy and confluent areas of decreased attenuation are noted throughout the deep and periventricular white matter of the cerebral hemispheres bilaterally, compatible with chronic microvascular ischemic disease. Hypodensity along the right pulse cerebral unchanged. No mass effect or midline shift. No hydrocephalus. Basilar cisterns are patent. Vascular: No hyperdense vessel. Atherosclerotic calcifications are present within the cavernous internal carotid arteries. Skull: No acute fracture or focal lesion. Sinuses/Orbits: Paranasal sinuses and mastoid air cells are clear. The orbits are unremarkable. Other:  None. IMPRESSION: 1. No acute intracranial abnormality. 2. Similar-appearing prominence of the lateral ventricles may be related to central predominant atrophy, although a component of normal pressure/communicating hydrocephalus cannot be excluded. Electronically Signed   By: Tish Frederickson M.D.   On: 01/11/2021 23:46   DG CHEST PORT 1 VIEW  Result Date: 01/11/2021 CLINICAL DATA:  Weakness EXAM: PORTABLE CHEST 1 VIEW COMPARISON:  10/22/2020 FINDINGS: Cardiac shadow is enlarged but accentuated by the portable technique. Aortic calcifications are noted. The lungs are clear. No bony abnormality is noted. IMPRESSION: No acute abnormality noted. Electronically Signed   By: Alcide Clever M.D.   On: 01/11/2021 23:42

## 2021-01-21 NOTE — Progress Notes (Signed)
Occupational Therapy Treatment Patient Details Name: Carolyn Norton MRN: [id number] DOB: Sep 03, 1966 Today's Date: 01/21/2021    History of present illness Pt presents from home on 01/13/21 with multiple falls, confusion, and weakness. In ED blood glucose was 729 and pt was unable to ambulate.  Patient admitted with severe dehydration and metabolic encephalopathy. PMH: DM2, COPD, CKD3, GERD, bipolar disorder type 1, previous covid 19 infection, polysubstance abuse including cocaine.   OT comments  Pt making steady progress towards OT goals this session. Session focus on safety awareness during full shower. Pt requires most assist for shower transfer to walk in shower with ~ 4 inch threshold. Pt attempted to step directly on threshold instead of stepping over it needing MOD A +1 for safety and balance, step by step cues needed to sequence safety awareness during transfer and entire shower. Pt with no recall of our session yesterday. Will continue effort to get pt back home at a level her parents are agreeable to handle.    Follow Up Recommendations  Supervision/Assistance - 24 hour;Home health OT    Equipment Recommendations  3 in 1 bedside commode;Other (comment) (RW)    Recommendations for Other Services      Precautions / Restrictions Precautions Precautions: Fall Restrictions Weight Bearing Restrictions: No       Mobility Bed Mobility Overal bed mobility: Needs Assistance Bed Mobility: Supine to Sit     Supine to sit: Supervision;HOB elevated     General bed mobility comments: supervision for safety    Transfers Overall transfer level: Needs assistance Equipment used: Rolling walker (2 wheeled) Transfers: Sit to/from Stand Sit to Stand: Min guard;Min assist         General transfer comment: pt required multiple attempts to power into standing from EOB needing cues for hand placement ( strong posterior lean0, MIN guard to rise from shower seat with cues for hand  placement    Balance Overall balance assessment: Needs assistance;History of Falls Sitting-balance support: No upper extremity supported;Feet supported Sitting balance-Leahy Scale: Fair Sitting balance - Comments: sitting EOB with supervision   Standing balance support: Bilateral upper extremity supported Standing balance-Leahy Scale: Poor Standing balance comment: reliant on BUE support                           ADL either performed or assessed with clinical judgement   ADL Overall ADL's : Needs assistance/impaired     Grooming: Wash/dry face;Supervision/safety;Sitting Grooming Details (indicate cue type and reason): sitting in shower Upper Body Bathing: Supervision/ safety;Sitting Upper Body Bathing Details (indicate cue type and reason): sitting in showe Lower Body Bathing: Supervison/ safety;Sitting/lateral leans Lower Body Bathing Details (indicate cue type and reason): sitting in shower Upper Body Dressing : Minimal assistance;Sitting   Lower Body Dressing: Moderate assistance;Sitting/lateral leans;Cueing for safety;Cueing for sequencing Lower Body Dressing Details (indicate cue type and reason): pt requried cues to sequence how to don socks as pt trying to don socks over heels, cues for safety awareness throughout Toilet Transfer: Minimal assistance;RW;Ambulation Toilet Transfer Details (indicate cue type and reason): cues for RW mgmt and keeping BLEs in RW     Tub/ Shower Transfer: Moderate assistance;Walk-in shower;Ambulation;Cueing for safety;Cueing for sequencing Tub/Shower Transfer Details (indicate cue type and reason): cues for safety awareness and support when stepping over lip of shower, pt trying to step directly on lip of shower vs stepping over threshold Functional mobility during ADLs: Minimal assistance;Rolling walker;Cueing for safety;Cueing for sequencing General ADL  Comments: pt continues to present with impaired safety awareness,  and impaired  cognition ( STM deficits). pt able to complete full shower this session     Vision       Perception     Praxis      Cognition Arousal/Alertness: Awake/alert Behavior During Therapy: WFL for tasks assessed/performed;Flat affect Overall Cognitive Status: Impaired/Different from baseline Area of Impairment: Attention;Memory;Following commands;Safety/judgement;Awareness;Problem solving                   Current Attention Level: Sustained Memory: Decreased short-term memory;Decreased recall of precautions (repeated cues for safet, unable to recall session from yesterday) Following Commands: Follows one step commands with increased time;Follows multi-step commands inconsistently;Follows one step commands inconsistently Safety/Judgement: Decreased awareness of safety;Decreased awareness of deficits Awareness: Intellectual Problem Solving: Slow processing;Difficulty sequencing;Requires verbal cues;Decreased initiation General Comments: repeated cues for safety awareness and to problem solve through potential unsafe scenarios such as getting out of shower with towels all over the bottom of the shower and drying feet prior to exiting shower        Exercises     Shoulder Instructions       General Comments no skin integrity issues noted during ADLs    Pertinent Vitals/ Pain       Pain Assessment: No/denies pain  Home Living                                          Prior Functioning/Environment              Frequency  Min 2X/week        Progress Toward Goals  OT Goals(current goals can now be found in the care plan section)  Progress towards OT goals: Progressing toward goals  Acute Rehab OT Goals Patient Stated Goal: to go home with assist from her parents OT Goal Formulation: With patient Time For Goal Achievement: 01/27/21 Potential to Achieve Goals: Good  Plan Discharge plan remains appropriate;Frequency remains appropriate     Co-evaluation                 AM-PAC OT "6 Clicks" Daily Activity     Outcome Measure   Help from another person eating meals?: None Help from another person taking care of personal grooming?: A Little Help from another person toileting, which includes using toliet, bedpan, or urinal?: A Little Help from another person bathing (including washing, rinsing, drying)?: A Little Help from another person to put on and taking off regular upper body clothing?: A Little Help from another person to put on and taking off regular lower body clothing?: A Little 6 Click Score: 19    End of Session Equipment Utilized During Treatment: Gait belt;Rolling walker  OT Visit Diagnosis: Unsteadiness on feet (R26.81);Other abnormalities of gait and mobility (R26.89);Muscle weakness (generalized) (M62.81)   Activity Tolerance Patient tolerated treatment well   Patient Left in chair;with call bell/phone within reach;with chair alarm set;with nursing/sitter in room   Nurse Communication Mobility status        Time: 1308-6578 OT Time Calculation (min): 46 min  Charges: OT General Charges $OT Visit: 1 Visit OT Treatments $Self Care/Home Management : 38-52 mins  Lenor Derrick., COTA/L Acute Rehabilitation Services [phone]    Barron Schmid 01/21/2021, 12:06 PM

## 2021-01-21 NOTE — Progress Notes (Signed)
Physical Therapy Treatment Patient Details Name: Carolyn Norton MRN: [id number] DOB: 04-17-1966 Today's Date: 01/21/2021    History of Present Illness Pt presents from home on 01/13/21 with multiple falls, confusion, and weakness. In ED blood glucose was 729 and pt was unable to ambulate.  Patient admitted with severe dehydration and metabolic encephalopathy. PMH: DM2, COPD, CKD3, GERD, bipolar disorder type 1, previous covid 19 infection, polysubstance abuse including cocaine.    PT Comments    Pt sitting up in chair on entry agreeable to therapy, no memory of working with OT this morning. Attempted walking with RW however pt with so much posterior lean and decreased knowledge of RW use that unsafe to attempt. Able to face to face transfer from recliner to EoB to work on seated balance and anterior lean to assist in standing balance. Pt then able to ambulate from bed to chair and back to recliner with HHA and face to face with increased tactile cuing to improve balance. Pt with increased mental fatigue so ended session with return to recliner. Pt will need constant supervision for all mobility. Pt will continue to work on balance and strength for increased safety with mobility.     Follow Up Recommendations  SNF;Supervision/Assistance - 24 hour     Equipment Recommendations  Rolling walker with 5" wheels;3in1 (PT)       Precautions / Restrictions Precautions Precautions: Fall Restrictions Weight Bearing Restrictions: No    Mobility  Bed Mobility Overal bed mobility: Needs Assistance Bed Mobility: Supine to Sit     Supine to sit: Supervision;HOB elevated     General bed mobility comments: Pt up in recliner    Transfers Overall transfer level: Needs assistance Equipment used: Rolling walker (2 wheeled);1 person hand held assist Transfers: Sit to/from Stand Sit to Stand: Min guard;Min assist Stand pivot transfers: Min assist       General transfer comment: stood  multiple times from recliner to RW, but unable to maintain upright due to increased posterior lean, improved with face to face hand hold assist transfer, able to pivot from recliner to bed surface with min A  Ambulation/Gait Ambulation/Gait assistance: Min assist Gait Distance (Feet): 24 Feet Assistive device: Rolling walker (2 wheeled);1 person hand held assist Gait Pattern/deviations: Step-to pattern;Step-through pattern;Decreased step length - right;Decreased step length - left;Shuffle;Trunk flexed Gait velocity: decr Gait velocity interpretation: <1.31 ft/sec, indicative of household ambulator General Gait Details: pt unable to use RW today due to increased posterior lean that could not safely be corrected, much better ambulation with face to face hand hold assist. Decreased ability for command follow for sequencing, providing increased tactile cuing to progress       Balance Overall balance assessment: Needs assistance;History of Falls Sitting-balance support: No upper extremity supported;Feet supported Sitting balance-Leahy Scale: Poor Sitting balance - Comments: Pt leaning posterioly and to the left sitting edge of chair   Standing balance support: No upper extremity supported;During functional activity;Single extremity supported Standing balance-Leahy Scale: Poor Standing balance comment: unable to maintain unsupported and unassisted balance                            Cognition Arousal/Alertness: Awake/alert Behavior During Therapy: WFL for tasks assessed/performed Overall Cognitive Status: Impaired/Different from baseline Area of Impairment: Attention;Memory;Following commands;Safety/judgement;Awareness;Problem solving                   Current Attention Level: Sustained Memory: Decreased short-term memory Following Commands: Follows one  step commands with increased time;Follows multi-step commands inconsistently Safety/Judgement: Decreased awareness of  safety;Decreased awareness of deficits Awareness: Intellectual Problem Solving: Slow processing;Difficulty sequencing;Requires verbal cues;Decreased initiation General Comments: pt with very poor command follow and carry over from one task to another. internally and externally distracted today      Exercises Other Exercises Other Exercises: worked on seated balance for 15 minutes with reaching outside of BoS and activities requiring increased flexed forward posture    General Comments General comments (skin integrity, edema, etc.): VSS on RW      Pertinent Vitals/Pain Pain Assessment: No/denies pain           PT Goals (current goals can now be found in the care plan section) Acute Rehab PT Goals Patient Stated Goal: to get a job and see her niece and nephew PT Goal Formulation: With patient Time For Goal Achievement: 01/26/21 Potential to Achieve Goals: Fair Progress towards PT goals: Progressing toward goals    Frequency    Min 5X/week      PT Plan Current plan remains appropriate;Frequency needs to be updated       AM-PAC PT "6 Clicks" Mobility   Outcome Measure  Help needed turning from your back to your side while in a flat bed without using bedrails?: None Help needed moving from lying on your back to sitting on the side of a flat bed without using bedrails?: A Little Help needed moving to and from a bed to a chair (including a wheelchair)?: A Little Help needed standing up from a chair using your arms (e.g., wheelchair or bedside chair)?: A Little Help needed to walk in hospital room?: A Little Help needed climbing 3-5 steps with a railing? : Total 6 Click Score: 17    End of Session Equipment Utilized During Treatment: Gait belt Activity Tolerance: Patient tolerated treatment well Patient left: in chair;with call bell/phone within reach;with chair alarm set Nurse Communication: Mobility status PT Visit Diagnosis: Muscle weakness (generalized)  (M62.81);Repeated falls (R29.6);Difficulty in walking, not elsewhere classified (R26.2);Unsteadiness on feet (R26.81)     Time: 6967-8938 PT Time Calculation (min) (ACUTE ONLY): 34 min  Charges:  $Gait Training: 8-22 mins $Therapeutic Activity: 8-22 mins                     Emeri Estill B. Beverely Risen PT, DPT Acute Rehabilitation Services Pager [phone] Office [phone]    [name] Fleet 01/21/2021, 2:29 PM

## 2021-01-22 LAB — GLUCOSE, CAPILLARY
Glucose-Capillary: 25 mg/dL — CL (ref 70–99)
Glucose-Capillary: 69 mg/dL — ABNORMAL LOW (ref 70–99)
Glucose-Capillary: 74 mg/dL (ref 70–99)
Glucose-Capillary: 204 mg/dL — ABNORMAL HIGH (ref 70–99)
Glucose-Capillary: 295 mg/dL — ABNORMAL HIGH (ref 70–99)
Glucose-Capillary: 295 mg/dL — ABNORMAL HIGH (ref 70–99)
Glucose-Capillary: 89 mg/dL (ref 70–99)
Glucose-Capillary: 108 mg/dL — ABNORMAL HIGH (ref 70–99)

## 2021-01-22 MED ORDER — INSULIN GLARGINE 100 UNIT/ML ~~LOC~~ SOLN
6.0000 [IU] | Freq: Every day | SUBCUTANEOUS | Status: DC
  Administered 2021-01-23: 6 [IU] via SUBCUTANEOUS
  Filled 2021-01-22: qty 0.06

## 2021-01-22 MED ORDER — INSULIN ASPART 100 UNIT/ML IJ SOLN
5.0000 [IU] | Freq: Three times a day (TID) | INTRAMUSCULAR | Status: DC
  Administered 2021-01-22: 5 [IU] via SUBCUTANEOUS

## 2021-01-22 NOTE — Progress Notes (Signed)
Pt blood sugar 25 this am. Pt given iv dextrose. Recheck CBG 74. Pt given juice and crackers until breakfast arrives. Will continue to monitor pt.

## 2021-01-22 NOTE — TOC Progression Note (Addendum)
Transition of Care [facility]) - Progression Note    Patient Details  Name: Carolyn Norton MRN: [id number] Date of Birth: 1966/03/13  [facility] CM/SW Contact  Beckie Busing, RN Phone Number: [phone]  01/22/2021, 9:01 AM  Clinical Narrative:    Currently no bed offers patient continues to have no payor source and does not meet criteria per medicaid screening for medicaid application. DME has been set up through [employer] and has been delivered to the room. TOC continues to follow but does not have immediate housing or rehab assistance for this patient.    Expected Discharge Plan: (P) Home/Self Care Barriers to Discharge: Continued Medical Work up  Expected Discharge Plan and Services Expected Discharge Plan: (P) Home/Self Care   Discharge Planning Services: (P) CM Consult   Living arrangements for the past 2 months: (P) Single Family Home Expected Discharge Date: 01/16/21               DME Arranged: 3-N-1,Walker rolling DME Agency: [employer] Date DME Agency Contacted: 01/20/21 Time DME Agency Contacted: [id number] Representative spoke with at DME Agency: [name]             Social Determinants of Health (SDOH) Interventions    Readmission Risk Interventions Readmission Risk Prevention Plan 01/14/2021  Transportation Screening Complete  PCP or Specialist Appt within 3-5 Days (No Data)  Social Work Consult for Recovery Care Planning/Counseling Complete  Palliative Care Screening Not Applicable  Medication Review Oceanographer) Referral to Pharmacy  Some recent data might be hidden

## 2021-01-22 NOTE — Progress Notes (Addendum)
Inpatient Diabetes Program Recommendations  AACE/ADA: New Consensus Statement on Inpatient Glycemic Control (2015)  Target Ranges:  Prepandial:   less than 140 mg/dL      Peak postprandial:   less than 180 mg/dL (1-2 hours)      Critically ill patients:  140 - 180 mg/dL   Lab Results  Component Value Date   GLUCAP 204 (H) 01/22/2021   HGBA1C 11.0 (H) 01/12/2021    Review of Glycemic Control Results for Carolyn Norton, Carolyn Norton (MRN [id number]) as of 01/22/2021 11:08  Ref. Range 01/20/2021 16:07 01/20/2021 20:40 01/20/2021 20:49 01/21/2021 06:16 01/21/2021 07:38 01/21/2021 12:07 01/21/2021 15:28 01/21/2021 17:09 01/21/2021 20:32 01/22/2021 06:12 01/22/2021 06:22 01/22/2021 06:25 01/22/2021 07:19  Glucose-Capillary Latest Ref Range: 70 - 99 mg/dL 291 (H) 31 (LL) 916 (H) 107 (H) 125 (H) 219 (H) 240 (H) 221 (H) 141 (H) 25 (LL) 69 (L) 74 204 (H)   Diabetes history: DM 2 Outpatient Diabetes medications:  Current orders for Inpatient glycemic control:  Lantus 12 units qhs Novolog 0-9 units tid Novolog 3 units tid meal coverage  Inpatient Diabetes Program Recommendations:    Severe Hypoglycemia slight adjustment in basal insulin.  -  Reduce Lantus to 5 units   Thanks,  Christena Deem RN, MSN, BC-ADM Inpatient Diabetes Coordinator Team Pager [phone] (8a-5p)

## 2021-01-22 NOTE — Progress Notes (Signed)
Physical Therapy Treatment Patient Details Name: Carolyn Norton MRN: [id number] DOB: [birth year] Today's Date: 01/22/2021    History of Present Illness Pt presents from home on 01/13/21 with multiple falls, confusion, and weakness. In ED blood glucose was 729 and pt was unable to ambulate.  Patient admitted with severe dehydration and metabolic encephalopathy. PMH: DM2, COPD, CKD3, GERD, bipolar disorder type 1, previous covid 19 infection, polysubstance abuse including cocaine.    PT Comments    Pt with decreasing ability to maintain upright posture in seated or standing due to increased posterior lean. Pt requires increased cuing for forward lean for power up into standing. Able to come to standing with modA but requires maxA for maintaining upright due to increased posterior lean, once able to bring CoG over BoS pt with decreased LE weakness and unable to lift feet from floor for marching in place returned to seated. Worked on seated balance at edge of recliner reaching forward and outside BoS with some success. On additional attempts requires min A for power up but again unable to maintain due to increase posterior lean, then becomes incontinent of urine and reports having no awareness. Assisted pt with pericare and gown change. D/c plans remain appropriate. PT will continue to follow acutely.    Follow Up Recommendations  SNF;Supervision/Assistance - 24 hour     Equipment Recommendations  Rolling walker with 5" wheels;3in1 (PT)       Precautions / Restrictions Precautions Precautions: Fall Restrictions Weight Bearing Restrictions: No    Mobility  Bed Mobility               General bed mobility comments: Pt up in recliner, although slumped down to point she requires assist to scoot back in chair so footrest could be elevated.    Transfers Overall transfer level: Needs assistance Equipment used: Rolling walker (2 wheeled);1 person hand held assist Transfers: Sit  to/from Stand Sit to Stand: Mod assist;Min assist         General transfer comment: modA for initital stand due to increased posterior lean, sat back down with decreased control able to stand 2x more with increased cuing and assist for rotation of hips forward and chest to upright, urinated without warning while standing  Ambulation/Gait             General Gait Details: unable to take safe steps away from bed         Balance Overall balance assessment: Needs assistance;History of Falls Sitting-balance support: No upper extremity supported;Feet supported Sitting balance-Leahy Scale: Poor Sitting balance - Comments: Pt leaning posterioly and to the left sitting edge of chair   Standing balance support: No upper extremity supported;During functional activity;Single extremity supported Standing balance-Leahy Scale: Poor Standing balance comment: unable to maintain unsupported and unassisted balance                            Cognition Arousal/Alertness: Awake/alert Behavior During Therapy: WFL for tasks assessed/performed Overall Cognitive Status: Impaired/Different from baseline Area of Impairment: Attention;Memory;Following commands;Safety/judgement;Awareness;Problem solving                   Current Attention Level: Sustained Memory: Decreased short-term memory Following Commands: Follows one step commands with increased time;Follows multi-step commands inconsistently Safety/Judgement: Decreased awareness of safety;Decreased awareness of deficits Awareness: Intellectual Problem Solving: Slow processing;Difficulty sequencing;Requires verbal cues;Decreased initiation General Comments: pt with very poor command follow and carry over from one task to another. internally  and externally distracted today      Exercises Other Exercises Other Exercises: worked on seated balance for 10 minutes with reaching outside of BoS and activities requiring increased  flexed forward posture    General Comments General comments (skin integrity, edema, etc.): pt unable to remember session with OT this morning      Pertinent Vitals/Pain Pain Assessment: No/denies pain           PT Goals (current goals can now be found in the care plan section) Acute Rehab PT Goals Patient Stated Goal: to get a job and see her niece and nephew PT Goal Formulation: With patient Time For Goal Achievement: 01/26/21 Potential to Achieve Goals: Fair Progress towards PT goals: Progressing toward goals    Frequency    Min 5X/week      PT Plan Current plan remains appropriate;Frequency needs to be updated       AM-PAC PT "6 Clicks" Mobility   Outcome Measure  Help needed turning from your back to your side while in a flat bed without using bedrails?: None Help needed moving from lying on your back to sitting on the side of a flat bed without using bedrails?: A Little Help needed moving to and from a bed to a chair (including a wheelchair)?: A Little Help needed standing up from a chair using your arms (e.g., wheelchair or bedside chair)?: A Little Help needed to walk in hospital room?: A Little Help needed climbing 3-5 steps with a railing? : Total 6 Click Score: 17    End of Session Equipment Utilized During Treatment: Gait belt Activity Tolerance: Patient tolerated treatment well Patient left: in chair;with call bell/phone within reach;with chair alarm set Nurse Communication: Mobility status PT Visit Diagnosis: Muscle weakness (generalized) (M62.81);Repeated falls (R29.6);Difficulty in walking, not elsewhere classified (R26.2);Unsteadiness on feet (R26.81)     Time: 8527-7824 PT Time Calculation (min) (ACUTE ONLY): 33 min  Charges:  $Therapeutic Exercise: 8-22 mins $Therapeutic Activity: 8-22 mins                     Taryn Shellhammer B. Beverely Risen PT, DPT Acute Rehabilitation Services Pager [phone] Office [phone]    Carolyn Alas  Norton 01/22/2021, 5:07 PM

## 2021-01-22 NOTE — Progress Notes (Signed)
Occupational Therapy Treatment Patient Details Name: Carolyn Norton MRN: [id number] DOB: 04/28/1966 Today's Date: 01/22/2021    History of present illness Pt presents from home on 01/13/21 with multiple falls, confusion, and weakness. In ED blood glucose was 729 and pt was unable to ambulate.  Patient admitted with severe dehydration and metabolic encephalopathy. PMH: DM2, COPD, CKD3, GERD, bipolar disorder type 1, previous covid 19 infection, polysubstance abuse including cocaine.   OT comments  Noted pts sitting balance and bed mobility significantly worse than prior sessions therefore, focus of todays session on sitting balance EOB. Pt presents with LOB posteriorly, to right and L side with no righting reaction noted. Pt needed up to MOD A at times to maintain upright posture EOB and min guard assist when pts BUEs positioned on pts knees. Worked on problem solving how to return pts trunk to upright positon if pt were to loose her balance posteriorly when sitting EOB, pt needed MAX step by step cues to sequence how to return to upright position and at least MIN A for physical assistance. No carryover of education noted at end of session as pt unable to state one activity pt completed during session. In an effort to compensate for pts STM deficits started memory book for pt and wrote down content of session. Will continue to work towards Dc home pending increased therapy frequency program can get pt to an appropriate level where her parents are agreeable to take her home.   Follow Up Recommendations  Supervision/Assistance - 24 hour;Home health OT    Equipment Recommendations  3 in 1 bedside commode;Other (comment) (RW)     Recommendations for Other Services      Precautions / Restrictions Precautions Precautions: Fall Restrictions Weight Bearing Restrictions: No       Mobility Bed Mobility Overal bed mobility: Needs Assistance Bed Mobility: Supine to Sit     Supine to sit: Min  assist;HOB elevated     General bed mobility comments: MIN to elevate trunk into sitting, pt needed step by step cues to problem solve bed mobility such as using both UE to reach for rail to assist with scooting hips forward and elevating trunk into sitting    Transfers Overall transfer level: Needs assistance Equipment used: Rolling walker (2 wheeled);1 person hand held assist Transfers: Sit to/from [employer] Sit to Stand: Min assist Stand pivot transfers: Mod assist       General transfer comment: pt unable to stand with RW without at least MIN A, insistent on pulling on RW despite max mulitmodal cues. once pt in standing with RW pt unable to sequence stepping to chair with RW, pt needed MOD A to pivot to recliner from EOB with face to face assist    Balance Overall balance assessment: Needs assistance;History of Falls Sitting-balance support: No upper extremity supported;Feet supported;Bilateral upper extremity supported Sitting balance-Leahy Scale: Poor Sitting balance - Comments: majority of session spent working on sitting balance EOB, pt needed up to MOD A to maintain static sitting balance but did progress to min guard at times with BUEs positioned on pt/ OTAs knees. worked on dynamic reaching with BUEs and returning to midline with pt needing MIN A to maintain upright posture and return to midline, pt with no awareness to postural imbalance even when visual cues provided on "line up your shoulders with mine" Postural control: Posterior lean;Right lateral lean;Left lateral lean Standing balance support: Bilateral upper extremity supported Standing balance-Leahy Scale: Poor Standing balance comment: unable to  maintain unsupported and unassisted balance                           ADL either performed or assessed with clinical judgement   ADL Overall ADL's : Needs assistance/impaired                 Upper Body Dressing : Maximal  assistance;Sitting Upper Body Dressing Details (indicate cue type and reason): to don new gown     Toilet Transfer: Moderate assistance;Stand-pivot Toilet Transfer Details (indicate cue type and reason): simulated via functional mobility, stand pivot from EOB >recliner with face to face assist         Functional mobility during ADLs: Moderate assistance General ADL Comments: session focus on dynamic/ static sitting balance EOB, additionally started low tech compensatory method for facilitating improved STM deficits of having pt write down what we did during session to increase carryover however pt with no carryover of what we did during session therefore OTA has to write down content of session     Vision Baseline Vision/History: Wears glasses Wears Glasses: At all times;Reading only (get inconsistent answers of whether or not pt wears glasses all the time or just for reading) Additional Comments: noted to lean to pts L side when attempting to read? will continue to further assess vision   Perception     Praxis      Cognition Arousal/Alertness: Awake/alert Behavior During Therapy: WFL for tasks assessed/performed Overall Cognitive Status: Impaired/Different from baseline Area of Impairment: Orientation;Attention;Memory;Following commands;Safety/judgement;Awareness;Problem solving                 Orientation Level: Time (when asked what month it is pt stated "8") Current Attention Level: Sustained Memory: Decreased recall of precautions;Decreased short-term memory (unable to recall what we did at end of session but did recall that we took a shower yesterday, unable to recall OTAs name) Following Commands: Follows one step commands inconsistently;Follows multi-step commands inconsistently;Follows one step commands with increased time Safety/Judgement: Decreased awareness of safety;Decreased awareness of deficits Awareness: Intellectual Problem Solving: Slow  processing;Difficulty sequencing;Requires verbal cues;Decreased initiation General Comments: very distracted this session, poor awareness to deficits. started memory book with pt to have pt work on improved recall of information however pt unable to state what we did at beginning of session by end of session, no awareness to month, day, date but did recall correct year. pt states she has never had bladder incontinence at home however her mother states differently yesterday on the phone.        Exercises     Shoulder Instructions       General Comments issued pt memory book to work on recalling therapy session and important imformation from the day.    Pertinent Vitals/ Pain       Pain Assessment: Faces Faces Pain Scale: Hurts little more Pain Location: L hand from getting new IV Pain Descriptors / Indicators: Sore Pain Intervention(s): Monitored during session  Home Living                                          Prior Functioning/Environment              Frequency  Min 2X/week        Progress Toward Goals  OT Goals(current goals can now be found in the care plan section)  Progress towards OT goals: Progressing toward goals  Acute Rehab OT Goals Patient Stated Goal: none stated today Time For Goal Achievement: 01/27/21 Potential to Achieve Goals: Good  Plan Discharge plan remains appropriate;Frequency remains appropriate    Co-evaluation                 AM-PAC OT "6 Clicks" Daily Activity     Outcome Measure   Help from another person eating meals?: None Help from another person taking care of personal grooming?: A Little Help from another person toileting, which includes using toliet, bedpan, or urinal?: A Little Help from another person bathing (including washing, rinsing, drying)?: A Little Help from another person to put on and taking off regular upper body clothing?: A Little Help from another person to put on and taking off  regular lower body clothing?: A Little 6 Click Score: 19    End of Session Equipment Utilized During Treatment: Gait belt;Rolling walker  OT Visit Diagnosis: Unsteadiness on feet (R26.81);Other abnormalities of gait and mobility (R26.89);Muscle weakness (generalized) (M62.81)   Activity Tolerance Patient tolerated treatment well   Patient Left in chair;with call bell/phone within reach;with chair alarm set   Nurse Communication Mobility status;Other (comment) (needs new purewick)        Time: 8657-8469 OT Time Calculation (min): 59 min  Charges: OT General Charges $OT Visit: 1 Visit OT Treatments $Therapeutic Activity: 53-67 mins  Lenor Derrick., COTA/L Acute Rehabilitation Services [phone]    Barron Schmid 01/22/2021, 10:29 AM

## 2021-01-22 NOTE — Progress Notes (Signed)
PROGRESS NOTE                                                                                                                                                                                                             Patient Demographics:    Carolyn Norton, is a 55 y.o. female, DOB - July 12, 1966, [id number]  Outpatient Primary MD for the patient is [name], FNP    LOS - 10  Admit date - 01/11/2021    Chief Complaint  Patient presents with  . Generalized Weakness  . Multiple Falls  . Medication Noncompliance  . Altered Mental Status  . Hyperglycemia       Brief Narrative (HPI from H&P)  -  Carolyn Norton is a 55 y.o. female with medical history significant for DM2, bipolar disorder type 1, previous covid 19 infection, polysubstance abuse including cocaine, who presented from home where she lives with her parents due to generalized weakness, multiple falls and confusion, I called her mother and was informed that patient has been in very poor health with inability to walk without falling over for at least a month, she did her last dose of crack cocaine about a month ago and ever since then she has been not doing well, poor balance multiple falls, not taking her medications, sugars running high.  In the ER she was diagnosed with severe dehydration, elevated sugars, metabolic encephalopathy and she was admitted to the hospital.   Subjective:  No overnight events-- likes to drink coffee   Assessment  & Plan :     Acute metabolic encephalopathy due to combination of severe dehydration, poorly controlled type 2 diabetes mellitus with hyperglycemia - -CT head is nonacute, she does not have any focal deficits, much improved after supportive care continue to monitor. -Per Dr. Thedore Mins:  patient was discharged on 01/16/2021 however she does not have a safe disposition as her mother/aunt refused to take her back home.   Patient is alert awake oriented and has the capacity to decide for herself, she is agreeable to placement at a rehab.  Social work requested to provide safe disposition.   History of polysubstance abuse including crack cocaine abuse -most recent UDS negative  History of bipolar disorder.  Resumed home dose Escitalopram, Neurontin & Depakote will monitor.  No acute issues currently.   - has  intact capacity to make her decisions. Cleared by Psych.    COPD.   -Supportive care no acute issues.  AKI on CKD 3A. -  Baseline creatinine around 1.3.  AKI resolved after hydration with IV fluid.  GERD  - PPI   Hypokalemia and hypomagnesemia.   -repleted  Dyslipidemia.   -On home dose statin.   DM type II with hyperglycemia.  A1c of 11.6 suggesting poor glycemic control outpatient due to hyperglycemia, she is on less than half home dose insulin, still having early morning hypoglycemic events, nighttime snack orderedLantus dropped further, will monitor.         Family Communication  : none  Code Status : Full  Consults  : Psych       Disposition Plan  :     Dispo:  Patient From: Home  Planned Disposition: unclear  Medically stable for discharge: No     DVT Prophylaxis  :    enoxaparin (LOVENOX) injection 40 mg Start: 01/12/21 1000     Lab Results  Component Value Date   PLT 185 01/21/2021    Diet :  Diet Order            Diet Carb Modified Fluid consistency: Thin; Room service appropriate? Yes  Diet effective now                  Inpatient Medications  Scheduled Meds: . aspirin  81 mg Oral Daily  . atorvastatin  20 mg Oral Daily  . divalproex  500 mg Oral BID  . enoxaparin (LOVENOX) injection  40 mg Subcutaneous Daily  . escitalopram  20 mg Oral Daily  . gabapentin  300 mg Oral BID  . insulin aspart  0-9 Units Subcutaneous TID WC  . insulin aspart  3 Units Subcutaneous BID WC  . [START ON 01/23/2021] insulin glargine  6 Units Subcutaneous Daily  .  montelukast  10 mg Oral QHS  . pantoprazole  40 mg Oral Daily   Continuous Infusions:  PRN Meds:.acetaminophen, albuterol, dextrose, melatonin, ondansetron (ZOFRAN) IV  Antibiotics  :    Anti-infectives (From admission, onward)   None       Time Spent in minutes  30   [place] U Jaion Lagrange DO on 01/22/2021 at 1:16 PM  To page go to [email or web address]           Objective:   Vitals:   01/21/21 1225 01/21/21 1952 01/22/21 0506 01/22/21 1229  BP: 132/64 110/71 111/62 120/82  Pulse:  74 65 77  Resp: 18 20 18 16   Temp: 97.8 F (36.6 C) 97.7 F (36.5 C) 97.7 F (36.5 C) 98.2 F (36.8 C)  TempSrc: Oral Oral Oral   SpO2: 92% 97% 97% 94%  Weight:      Height:        Wt Readings from Last 3 Encounters:  01/11/21 99.8 kg  11/21/20 85.5 kg  10/22/20 85.5 kg     Intake/Output Summary (Last 24 hours) at 01/22/2021 1316 Last data filed at 01/22/2021 0313 Gross per 24 hour  Intake --  Output 600 ml  Net -600 ml     Physical Exam  In chair, NAD Pleasant and cooperative No increased work of breathing     Data Review:    CBC Recent Labs  Lab 01/16/21 0201 01/21/21 0335  WBC 9.1 5.9  HGB 12.2 13.3  HCT 37.3 39.8  PLT 114* 185  MCV 86.9 87.1  MCH 28.4 29.1  MCHC 32.7 33.4  RDW  14.7 14.6  LYMPHSABS 3.8  --   MONOABS 1.1*  --   EOSABS 0.2  --   BASOSABS 0.1  --     Recent Labs  Lab 01/16/21 0201 01/21/21 0335  NA 136 135  K 4.6 4.0  CL 100 96*  CO2 29 30  GLUCOSE 135* 96  BUN 12 22*  CREATININE 1.19* 1.12*  CALCIUM 9.1 9.3  AST 20  --   ALT 11  --   ALKPHOS 40  --   BILITOT 0.4  --   ALBUMIN 2.5*  --   MG 1.7 1.9  BNP 40.9  --     ------------------------------------------------------------------------------------------------------------------ No results for input(s): CHOL, HDL, LDLCALC, TRIG, CHOLHDL, LDLDIRECT in the last 72 hours.  Lab Results  Component Value Date   HGBA1C 11.0 (H) 01/12/2021    ------------------------------------------------------------------------------------------------------------------ No results for input(s): TSH, T4TOTAL, T3FREE, THYROIDAB in the last 72 hours.  Invalid input(s): FREET3  Cardiac Enzymes No results for input(s): CKMB, TROPONINI, MYOGLOBIN in the last 168 hours.  Invalid input(s): CK ------------------------------------------------------------------------------------------------------------------    Component Value Date/Time   BNP 40.9 01/16/2021 0201    Micro Results No results found for this or any previous visit (from the past 240 hour(s)).  Radiology Reports  CT Head Wo Contrast  Result Date: 01/11/2021 CLINICAL DATA:  Mental status change.  Cause unknown. EXAM: CT HEAD WITHOUT CONTRAST TECHNIQUE: Contiguous axial images were obtained from the base of the skull through the vertex without intravenous contrast. COMPARISON:  CT head 10/22/2020 FINDINGS: Brain: Similar-appearing prominence of the lateral ventricles may be related to central predominant atrophy, although a component of normal pressure/communicating hydrocephalus cannot be excluded. No evidence of large-territorial acute infarction. No parenchymal hemorrhage. No mass lesion. No extra-axial collection. Patchy and confluent areas of decreased attenuation are noted throughout the deep and periventricular white matter of the cerebral hemispheres bilaterally, compatible with chronic microvascular ischemic disease. Hypodensity along the right pulse cerebral unchanged. No mass effect or midline shift. No hydrocephalus. Basilar cisterns are patent. Vascular: No hyperdense vessel. Atherosclerotic calcifications are present within the cavernous internal carotid arteries. Skull: No acute fracture or focal lesion. Sinuses/Orbits: Paranasal sinuses and mastoid air cells are clear. The orbits are unremarkable. Other: None. IMPRESSION: 1. No acute intracranial abnormality. 2. Similar-appearing  prominence of the lateral ventricles may be related to central predominant atrophy, although a component of normal pressure/communicating hydrocephalus cannot be excluded. Electronically Signed   By: Tish Frederickson M.D.   On: 01/11/2021 23:46   DG CHEST PORT 1 VIEW  Result Date: 01/11/2021 CLINICAL DATA:  Weakness EXAM: PORTABLE CHEST 1 VIEW COMPARISON:  10/22/2020 FINDINGS: Cardiac shadow is enlarged but accentuated by the portable technique. Aortic calcifications are noted. The lungs are clear. No bony abnormality is noted. IMPRESSION: No acute abnormality noted. Electronically Signed   By: Alcide Clever M.D.   On: 01/11/2021 23:42

## 2021-01-23 LAB — GLUCOSE, CAPILLARY
Glucose-Capillary: 98 mg/dL (ref 70–99)
Glucose-Capillary: 468 mg/dL — ABNORMAL HIGH (ref 70–99)
Glucose-Capillary: 274 mg/dL — ABNORMAL HIGH (ref 70–99)
Glucose-Capillary: 300 mg/dL — ABNORMAL HIGH (ref 70–99)
Glucose-Capillary: 162 mg/dL — ABNORMAL HIGH (ref 70–99)
Glucose-Capillary: 137 mg/dL — ABNORMAL HIGH (ref 70–99)
Glucose-Capillary: 23 mg/dL — CL (ref 70–99)

## 2021-01-23 LAB — GLUCOSE, RANDOM: Glucose, Bld: 534 mg/dL (ref 70–99)

## 2021-01-23 MED ORDER — INSULIN ASPART 100 UNIT/ML IJ SOLN
3.0000 [IU] | Freq: Three times a day (TID) | INTRAMUSCULAR | Status: DC
  Administered 2021-01-23 – 2021-01-24 (×2): 3 [IU] via SUBCUTANEOUS

## 2021-01-23 MED ORDER — INSULIN ASPART 100 UNIT/ML IJ SOLN
12.0000 [IU] | Freq: Once | INTRAMUSCULAR | Status: AC
  Administered 2021-01-23: 12 [IU] via SUBCUTANEOUS

## 2021-01-23 MED ORDER — INSULIN GLARGINE 100 UNIT/ML ~~LOC~~ SOLN
4.0000 [IU] | Freq: Every day | SUBCUTANEOUS | Status: DC
  Filled 2021-01-23: qty 0.04

## 2021-01-23 NOTE — TOC Progression Note (Signed)
Transition of Care [facility]) - Progression Note    Patient Details  Name: Carolyn Norton MRN: [id number] Date of Birth: [birth year]  Transition of Care [facility]) CM/SW [place], RN Phone Number: 01/23/2021, 12:46 PM  Clinical Narrative:    Case management met with the patient at the bedside in regards to transitions of care.  The patient, at the present, was sitting in the recliner and needed assistance to reposition self and pillow in the chair.  The patient states that she currently lives with her mother and father, who are both elderly, in the home.  The patient understands that discharge plans include discharging to home with follow up with [facility] once patient is strong enough to mobilize in the home with assistance from present family members.  DME is present in the room including RW and 3:1 at the bedside for home.  CM and MSW with DTP Team will continue to follow for discharge needs for home with [facility] follow up.   Expected Discharge Plan: Home/Self Care Barriers to Discharge: Family Issues,Continued Medical Work up  Expected Discharge Plan and Services Expected Discharge Plan: Home/Self Care In-house Referral: Clinical Social Work Discharge Planning Services: CM Consult   Living arrangements for the past 2 months: Single Family Home Expected Discharge Date: 01/16/21               DME Arranged: 3-N-1,Walker rolling DME Agency: [employer] Date DME Agency Contacted: 01/20/21 Time DME Agency Contacted: [id number] Representative spoke with at DME Agency: Adela Lank             Social Determinants of Health ([place]) Interventions    Readmission Risk Interventions Readmission Risk Prevention Plan 01/14/2021  Transportation Screening Complete  PCP or Specialist Appt within 3-5 Days (No Data)  Social Work Consult for [place] Planning/Counseling [place] Not Applicable  Medication Review [employer])  Referral to Pharmacy  Some recent data might be hidden

## 2021-01-23 NOTE — Progress Notes (Signed)
Physical Therapy Treatment Patient Details Name: Carolyn Norton MRN: [id number] DOB: October 11, 1965 Today's Date: 01/23/2021    History of Present Illness Pt presents from home on 01/13/21 with multiple falls, confusion, and weakness. In ED blood glucose was 729 and pt was unable to ambulate.  Patient admitted with severe dehydration and metabolic encephalopathy. PMH: DM2, COPD, CKD3, GERD, bipolar disorder type 1, previous covid 19 infection, polysubstance abuse including cocaine.    PT Comments    At direction of OT worked with pt in front of mirror to increase forward lean with activities in front of her. Pt supine in bed with LE hanging out on entry requiring min A for bringing trunk to upright. Assisted pt with face to face HHA "dancing" to chair. Pt able to take larger steps with cuing for dancing than walking. Once in chair wheeled to in front of mirror where pt able to stand with min A and participate in reaching for numbers written on mirror. Pt doing well but when fatigued "flops" posteriorly onto recliner. While sitting at sink pt notices toothbrush and requests to brush her teeth. Agree however only if pt stands to do it. With functional activity able to stand at sink in tripod position and brush teeth. This is far more anterior leaning activity than pt has done in past. Making slight progress towards goals, however continues to need SNF level rehab. PT will continue to follow acutely.    Follow Up Recommendations  SNF;Supervision/Assistance - 24 hour     Equipment Recommendations  Rolling walker with 5" wheels;3in1 (PT)       Precautions / Restrictions Precautions Precautions: Fall    Mobility  Bed Mobility Overal bed mobility: Needs Assistance Bed Mobility: Supine to Sit     Supine to sit: Min assist;HOB elevated     General bed mobility comments: pt laying in bed with LE hanging out, requiring min A for bringing trunk to upright with HoB elevated     Transfers Overall transfer level: Needs assistance Equipment used: Rolling walker (2 wheeled);1 person hand held assist Transfers: Sit to/from Stand Sit to Stand: Min assist Stand pivot transfers: Mod assist       General transfer comment: min A for sit to stand from bed and modA for "dancing" to chair with maximal multimodal cuing for large steps, minA for pushing up from recliner in front of mirror x2        Balance Overall balance assessment: Needs assistance;History of Falls Sitting-balance support: No upper extremity supported;Feet supported Sitting balance-Leahy Scale: Poor Sitting balance - Comments: leaning to L Postural control: Left lateral lean Standing balance support: No upper extremity supported;During functional activity;Single extremity supported   Standing balance comment: unable to maintain unsupported and unassisted balance, posterior lean in standing initially                            Cognition Arousal/Alertness: Awake/alert Behavior During Therapy: WFL for tasks assessed/performed Overall Cognitive Status: Impaired/Different from baseline Area of Impairment: Orientation;Attention;Memory;Following commands;Safety/judgement;Awareness;Problem solving                 Orientation Level: Disoriented to;Time (when asked what day it is pt stated "22" even after using calendar to problem solve) Current Attention Level: Sustained Memory: Decreased short-term memory Following Commands: Follows one step commands with increased time;Follows multi-step commands inconsistently Safety/Judgement: Decreased awareness of safety;Decreased awareness of deficits Awareness: Intellectual Problem Solving: Slow processing;Difficulty sequencing;Requires verbal cues;Decreased initiation;Requires tactile  cues (tactile cues needed for completeding HEP correctly) General Comments: unable to recall activities completed even after just doing them, for example after  working on standing from chair OTA asked pt to write down what we just worked on, even when options provided such as "did we lay down, lay on the floor, or stand" pt unable to recall. pt did initiate looking into planner to problem solve the date but just states "22" when asked what day it is. used mirror during sit<>stand and balance training in an effort to facilitate improved body awareness with some improvements noted. pt very distracted by noises, items on floor and when pt doesn't know the answer to OTA question pt noted to defer with facial gestures such as "kissy face" or laughing sporadically, feel pt is nearing a level of poor rehab potentially most d/t poor cognition and decreased ability to carryover education.      Exercises Other Exercises Other Exercises: standing at sink reaching forward to numbers written on mirror to increase forward lean Other Exercises: standing while brushing teeth    General Comments General comments (skin integrity, edema, etc.): continues to not have memory of previous session with PT or OT      Pertinent Vitals/Pain Faces Pain Scale: No hurt           PT Goals (current goals can now be found in the care plan section) Acute Rehab PT Goals Patient Stated Goal: to go home PT Goal Formulation: With patient Time For Goal Achievement: 01/26/21 Potential to Achieve Goals: Fair Progress towards PT goals: Progressing toward goals    Frequency    Min 5X/week      PT Plan Current plan remains appropriate;Frequency needs to be updated    Co-evaluation              AM-PAC PT "6 Clicks" Mobility   Outcome Measure  Help needed turning from your back to your side while in a flat bed without using bedrails?: None Help needed moving from lying on your back to sitting on the side of a flat bed without using bedrails?: A Lot Help needed moving to and from a bed to a chair (including a wheelchair)?: A Lot Help needed standing up from a chair  using your arms (e.g., wheelchair or bedside chair)?: A Lot Help needed to walk in hospital room?: Total Help needed climbing 3-5 steps with a railing? : Total 6 Click Score: 12    End of Session Equipment Utilized During Treatment: Gait belt Activity Tolerance: Patient tolerated treatment well Patient left: with call bell/phone within reach;in bed;with bed alarm set Nurse Communication: Mobility status PT Visit Diagnosis: Muscle weakness (generalized) (M62.81);Repeated falls (R29.6);Difficulty in walking, not elsewhere classified (R26.2);Unsteadiness on feet (R26.81)     Time: 1540-1620 PT Time Calculation (min) (ACUTE ONLY): 40 min  Charges:  $Therapeutic Activity: 8-22 mins $Neuromuscular Re-education: 8-22 mins $Self Care/Home Management: 8-22                     Shiana Rappleye B. Beverely Risen PT, DPT Acute Rehabilitation Services Pager [phone] Office [phone]    [name] Fleet 01/23/2021, 4:30 PM

## 2021-01-23 NOTE — Progress Notes (Signed)
Occupational Therapy Treatment Patient Details Name: Carolyn Norton MRN: [id number] DOB: 1966-06-03 Today's Date: 01/23/2021    History of present illness Pt presents from home on 01/13/21 with multiple falls, confusion, and weakness. In ED blood glucose was 729 and pt was unable to ambulate.  Patient admitted with severe dehydration and metabolic encephalopathy. PMH: DM2, COPD, CKD3, GERD, bipolar disorder type 1, previous covid 19 infection, polysubstance abuse including cocaine.   OT comments  Pt making steady progress towards OT goals this session. Session focus on BUE therex and standing balance in prep for higher level functional mobility. Pt continues to present with poor awareness and STM deficits unable to recall date or even recall activities after just completing them ( see cog section). Utilized mirror this session in an effort to increase overall body awareness during mobility, pt needing MIN A - min guard assist to stand from chair x3 with max multimodal cues for hand placement and positioning. Pt able to take steps forward<>backward using mirror to determine appropriate stride length as pt present with short guarded gait. Pt additionally completed seated UB therex as indicated below with no reports of increased pain but needed max multimodal cues for set- up of exercises and overall body mechanics. Continuing to works towards DC home ( as pt with no other DC options at this time) but beginning to fear pt is nearing a poor rehab potential mostly d/t poor cognition and decreased ability to carryover education   Follow Up Recommendations  Supervision/Assistance - 24 hour;Home health OT    Equipment Recommendations  3 in 1 bedside commode;Other (comment) (RW)    Recommendations for Other Services      Precautions / Restrictions Precautions Precautions: Fall Restrictions Weight Bearing Restrictions: No       Mobility Bed Mobility               General bed mobility  comments: pt up in chair upon arrival and left up in recliner at end of session    Transfers Overall transfer level: Needs assistance Equipment used: Rolling walker (2 wheeled);1 person hand held assist Transfers: Sit to/from Stand Sit to Stand: Min assist;Min guard         General transfer comment: used mirror for visual feedback to increase body awareness, pt requried max mulitmodal cues for hand placement but was able to stand from chair x2 with min guard assist, MIN A initially as pt initially with strong posterior lean in standing. pt even able to take a few steps forward<>backward using mirror to allow pt to visual appropriate length of step needed    Balance Overall balance assessment: Needs assistance;History of Falls Sitting-balance support: No upper extremity supported;Feet supported Sitting balance-Leahy Scale: Poor Sitting balance - Comments: leaning to L from chair even when back fully supported Postural control: Left lateral lean Standing balance support: No upper extremity supported;During functional activity;Single extremity supported   Standing balance comment: unable to maintain unsupported and unassisted balance, posterior lean in standing initially                           ADL either performed or assessed with clinical judgement   ADL Overall ADL's : Needs assistance/impaired                                     Functional mobility during ADLs: Minimal assistance;Rolling walker General ADL  Comments: session focus on BUE HEP with level 1 theraband and standing balance from chair     Vision       Perception     Praxis      Cognition Arousal/Alertness: Awake/alert Behavior During Therapy: WFL for tasks assessed/performed Overall Cognitive Status: Impaired/Different from baseline Area of Impairment: Orientation;Attention;Memory;Following commands;Safety/judgement;Awareness;Problem solving                 Orientation  Level: Disoriented to;Time (when asked what day it is pt stated "22" even after using calendar to problem solve) Current Attention Level: Sustained Memory: Decreased short-term memory Following Commands: Follows one step commands with increased time;Follows multi-step commands inconsistently Safety/Judgement: Decreased awareness of safety;Decreased awareness of deficits Awareness: Intellectual Problem Solving: Slow processing;Difficulty sequencing;Requires verbal cues;Decreased initiation;Requires tactile cues (tactile cues needed for completeding HEP correctly) General Comments: unable to recall activities completed even after just doing them, for example after working on standing from chair OTA asked pt to write down what we just worked on, even when options provided such as "did we lay down, lay on the floor, or stand" pt unable to recall. pt did initiate looking into planner to problem solve the date but just states "22" when asked what day it is. used mirror during sit<>stand and balance training in an effort to facilitate improved body awareness with some improvements noted. pt very distracted by noises, items on floor and when pt doesn't know the answer to OTA question pt noted to defer with facial gestures such as "kissy face" or laughing sporadically, feel pt is nearing a level of poor rehab potential mostly d/t poor cognition and decreased ability to carryover education.        Exercises General Exercises - Upper Extremity Shoulder Flexion: Strengthening;Both;10 reps;Seated;Theraband Theraband Level (Shoulder Flexion): Level 1 (Yellow) Shoulder Extension: Strengthening;Both;10 reps;Seated Shoulder Horizontal ABduction: Strengthening;10 reps;Seated;Theraband Theraband Level (Shoulder Horizontal Abduction): Level 1 (Yellow) Shoulder Horizontal ADduction: Strengthening;10 reps;Seated;Theraband Theraband Level (Shoulder Horizontal Adduction): Level 1 (Yellow) Elbow Flexion:  Strengthening;Both;10 reps;Seated;Theraband Theraband Level (Elbow Flexion): Level 1 (Yellow) Elbow Extension: Strengthening;Both;10 reps;Seated;Theraband Theraband Level (Elbow Extension): Level 1 (Yellow) Other Exercises Other Exercises: bilateral punches with BUEs seated with level 1 theraband x10 reps   Shoulder Instructions       General Comments  continuing to use memory book to assist pt with recalling day to day information but pt requires MAX A to recall activities worked on during session- pt did initiate looking to it today to recall what day it is.     Pertinent Vitals/ Pain       Pain Assessment: Faces Faces Pain Scale: No hurt  Home Living                                          Prior Functioning/Environment              Frequency  Min 2X/week        Progress Toward Goals  OT Goals(current goals can now be found in the care plan section)  Progress towards OT goals: Progressing toward goals  Acute Rehab OT Goals Patient Stated Goal: to go home OT Goal Formulation: With patient Time For Goal Achievement: 01/27/21 Potential to Achieve Goals: Good  Plan Discharge plan remains appropriate;Frequency remains appropriate    Co-evaluation                 AM-PAC  OT "6 Clicks" Daily Activity     Outcome Measure   Help from another person eating meals?: None Help from another person taking care of personal grooming?: A Little Help from another person toileting, which includes using toliet, bedpan, or urinal?: A Little Help from another person bathing (including washing, rinsing, drying)?: A Little Help from another person to put on and taking off regular upper body clothing?: A Little Help from another person to put on and taking off regular lower body clothing?: A Little 6 Click Score: 19    End of Session Equipment Utilized During Treatment: Gait belt;Rolling walker  OT Visit Diagnosis: Unsteadiness on feet (R26.81);Other  abnormalities of gait and mobility (R26.89);Muscle weakness (generalized) (M62.81)   Activity Tolerance Patient tolerated treatment well   Patient Left in chair;with call bell/phone within reach;with chair alarm set   Nurse Communication Mobility status;Other (comment) (purewick came out but needs to be working on b/b program for home)        Time: [phone] OT Time Calculation (min): 42 min  Charges: OT General Charges $OT Visit: 1 Visit OT Treatments $Therapeutic Activity: 23-37 mins $Therapeutic Exercise: 8-22 mins  Lenor Derrick., COTA/L Acute Rehabilitation Services [phone]    Barron Schmid 01/23/2021, 9:04 AM

## 2021-01-23 NOTE — Progress Notes (Signed)
[facility] PROGRESS NOTE  Carolyn Norton HMC:[id number] DOB: July 03, 1966 DOA: 01/11/2021 PCP: [name], FNP  Status: Remains inpatient appropriate because:Unsafe d/c plan  Dispo:  Patient From: Home  Planned Disposition: Home-patient agreeable to transition immediately from hospital to drug rehabilitation facility if can obtain a bed once she is physically medically stable for discharge  Medically stable for discharge: No  Barriers to DC: Continues to require significant assistance with mobility.             Difficult to place: Yes  Level of care: Med-Surg  Code Status: Full Family Communication:  DVT prophylaxis: Lovenox COVID vaccination status: Unknown   HPI: 55 year old female past medical history of diabetes mellitus type 2, bipolar disorder type I, previous COVID infection, polysubstance abuse that includes cocaine.  She presented from home where she lives with her parents.  Her presenting symptoms included generalized weakness, multiple falls and confusion.  Family confirmed that she had been having the symptoms for at least a month with last use of crack cocaine 1 month prior to presentation.  Ever since that time she had not been doing well.  She would also be having issues with hyperglycemia.  In the ER she was diagnosed with severe dehydration, hyperglycemia and metabolic encephalopathy.  Since admission patient has been evaluated by the psychiatric team who has deemed her alert and oriented and having capacity to make decisions.  Patient is agreeable to placement at a rehab.  Currently she has severe physical deconditioning which was POA and PT is recommending SNF.  Because of her history of polysubstance abuse and lack of funding SNF is not an option and current plan is to provide aggressive physical rehabilitation in hopes patient will be physically ready to discharge to a drug rehabilitation center.  Of note patient's family has refused to accept her back  into the home.  Subjective: Alert and pleasant.  Reiterates she is willing to go to drug rehab and also states that she feels her family will be more likely to take her back into the private home environment if she attends rehab first.  No complaints otherwise.  Objective: Vitals:   01/22/21 1917 01/23/21 0506  BP: 119/67 120/80  Pulse: 67 68  Resp: 18 18  Temp: 98.4 F (36.9 C) 98.4 F (36.9 C)  SpO2: 92% 94%   No intake or output data in the 24 hours ending 01/23/21 0828 Filed Weights   01/11/21 1152  Weight: 99.8 kg    Exam:  Constitutional: NAD, calm, comfortable Respiratory: clear to auscultation bilaterally, no wheezing, no crackles. Normal respiratory effort. No accessory muscle use.  Cardiovascular: Regular rate and rhythm, no murmurs / rubs / gallops. No extremity edema. 2+ pedal pulses. Abdomen: no tenderness, no masses palpated. Bowel sounds positive. LBM/15 Neurologic: CN 2-12 grossly intact. Sensation intact, DTR normal. Strength 5/5 x all 4 extremities.  Continues to have significant ambulatory dysfunction and gait disturbance secondary to severe physical deconditioning Psychiatric: Normal judgment and insight. Alert and oriented x 3. Normal mood.    Assessment/Plan: Acute problems: Acute metabolic encephalopathy (multifactorial): Severe dehydration in context of poorly controlled diabetes mellitus with hyperglycemia Likely cocaine withdrawal and poor nutrition POA contributed Acute encephalopathy now resolved  Diabetes mellitus 2 with hyperglycemia on insulin Hemoglobin A1c 11.0 CBGs well controlled here Continues with lower than expected CBG readings so we will decrease Lantus to 4 units and meal coverage to 3 units  Dyslipidemia Continue statin  History of bipolar 1 disorder/polysubstance abuse Continue  escitalopram, Neurontin and Depakote LFTs in hospitalization Cleared by psych regarding orientation and capacity Patient is willing to be evaluated  and treated at drug rehabilitation facility after discharge    Other problems: Acute kidney injury on CKD 3a Acute kidney injury resolved after IV fluids Baseline creatinine 1.3  COPD Continue Singulair, nebs as needed, consider adding LABA  GERD Continue PPI  Hypokalemia/hypomagnesemia Resolved  Data Reviewed: Basic Metabolic Panel: Recent Labs  Lab 01/21/21 0335  NA 135  K 4.0  CL 96*  CO2 30  GLUCOSE 96  BUN 22*  CREATININE 1.12*  CALCIUM 9.3  MG 1.9   Liver Function Tests: No results for input(s): AST, ALT, ALKPHOS, BILITOT, PROT, ALBUMIN in the last 168 hours. No results for input(s): LIPASE, AMYLASE in the last 168 hours. No results for input(s): AMMONIA in the last 168 hours. CBC: Recent Labs  Lab 01/21/21 0335  WBC 5.9  HGB 13.3  HCT 39.8  MCV 87.1  PLT 185   Cardiac Enzymes: No results for input(s): CKTOTAL, CKMB, CKMBINDEX, TROPONINI in the last 168 hours. BNP (last 3 results) Recent Labs    01/14/21 0238 01/15/21 0230 01/16/21 0201  BNP 177.8* 115.9* 40.9    ProBNP (last 3 results) No results for input(s): PROBNP in the last 8760 hours.  CBG: Recent Labs  Lab 01/22/21 1217 01/22/21 1551 01/22/21 1955 01/22/21 2158 01/23/21 0731  GLUCAP 295* 295* 89 108* 98    No results found for this or any previous visit (from the past 240 hour(s)).   Studies: No results found.  Scheduled Meds: . aspirin  81 mg Oral Daily  . atorvastatin  20 mg Oral Daily  . divalproex  500 mg Oral BID  . enoxaparin (LOVENOX) injection  40 mg Subcutaneous Daily  . escitalopram  20 mg Oral Daily  . gabapentin  300 mg Oral BID  . insulin aspart  0-9 Units Subcutaneous TID WC  . insulin aspart  5 Units Subcutaneous TID WC  . insulin glargine  6 Units Subcutaneous Daily  . montelukast  10 mg Oral QHS  . pantoprazole  40 mg Oral Daily   Continuous Infusions:  Principal Problem:   Bipolar 1 disorder, depressed, full remission (HCC) Active Problems:    Bipolar 1 disorder (HCC)   Generalized weakness   Consultants:  Psychiatry  Procedures:  None  Antibiotics: Anti-infectives (From admission, onward)   None        Time spent: 35 minutes    [name] ANP  [facility] 7 am - 330 pm/M-F for direct patient care and secure chat Please refer to Amion for contact info 11  days

## 2021-01-24 LAB — GLUCOSE, CAPILLARY
Glucose-Capillary: 201 mg/dL — ABNORMAL HIGH (ref 70–99)
Glucose-Capillary: 238 mg/dL — ABNORMAL HIGH (ref 70–99)
Glucose-Capillary: 204 mg/dL — ABNORMAL HIGH (ref 70–99)
Glucose-Capillary: 218 mg/dL — ABNORMAL HIGH (ref 70–99)
Glucose-Capillary: 94 mg/dL (ref 70–99)

## 2021-01-24 LAB — CBC
HCT: 37.7 % (ref 36.0–46.0)
Hemoglobin: 12.5 g/dL (ref 12.0–15.0)
MCH: 29 pg (ref 26.0–34.0)
MCHC: 33.2 g/dL (ref 30.0–36.0)
MCV: 87.5 fL (ref 80.0–100.0)
Platelets: 199 10*3/uL (ref 150–400)
RBC: 4.31 MIL/uL (ref 3.87–5.11)
RDW: 14.7 % (ref 11.5–15.5)
WBC: 6.4 10*3/uL (ref 4.0–10.5)
nRBC: 0 % (ref 0.0–0.2)

## 2021-01-24 LAB — AMMONIA: Ammonia: 28 umol/L (ref 9–35)

## 2021-01-24 LAB — BASIC METABOLIC PANEL
Anion gap: 6 (ref 5–15)
BUN: 22 mg/dL — ABNORMAL HIGH (ref 6–20)
CO2: 30 mmol/L (ref 22–32)
Calcium: 8.9 mg/dL (ref 8.9–10.3)
Chloride: 97 mmol/L — ABNORMAL LOW (ref 98–111)
Creatinine, Ser: 1.26 mg/dL — ABNORMAL HIGH (ref 0.44–1.00)
GFR, Estimated: 51 mL/min — ABNORMAL LOW (ref >60)
Glucose, Bld: 203 mg/dL — ABNORMAL HIGH (ref 70–99)
Potassium: 4 mmol/L (ref 3.5–5.1)
Sodium: 133 mmol/L — ABNORMAL LOW (ref 135–145)

## 2021-01-24 MED ORDER — METFORMIN HCL 500 MG PO TABS
500.0000 mg | ORAL_TABLET | Freq: Two times a day (BID) | ORAL | Status: DC
  Administered 2021-01-24 – 2021-01-29 (×12): 500 mg via ORAL
  Filled 2021-01-24 (×13): qty 1

## 2021-01-24 NOTE — Progress Notes (Signed)
Occupational Therapy Treatment Patient Details Name: Carolyn Norton MRN: [id number] DOB: 17-May-1966 Today's Date: 01/24/2021    History of present illness Pt presents from home on 01/13/21 with multiple falls, confusion, and weakness. In ED blood glucose was 729 and pt was unable to ambulate.  Patient admitted with severe dehydration and metabolic encephalopathy. PMH: DM2, COPD, CKD3, GERD, bipolar disorder type 1, previous covid 19 infection, polysubstance abuse including cocaine.   OT comments  OT treatment session with focus on assessment of progress toward goals, ADLs, ADL transfers, and cognition. Patient making slow progress with continued deficits in cognition, strength, balance, and safety awareness although improving. Patient would benefit from continued acute OT services to maximize safety and independence with self-care tasks in prep for safe d/c to next level of care.    Follow Up Recommendations  Supervision/Assistance - 24 hour;Home health OT    Equipment Recommendations  3 in 1 bedside commode;Other (comment)    Recommendations for Other Services      Precautions / Restrictions Precautions Precautions: Fall Restrictions Weight Bearing Restrictions: No       Mobility Bed Mobility Overal bed mobility: Needs Assistance Bed Mobility: Rolling;Sidelying to Sit Rolling: Min assist Sidelying to sit: Min assist Supine to sit: Min assist;HOB elevated     General bed mobility comments: Cues for hand placement and use of bed rail. Patient easily distracted requiring reminders. Able to advance BLE toward EOB with increased time. Min A to bring trunk upright.    Transfers Overall transfer level: Needs assistance Equipment used: Rolling walker (2 wheeled) Transfers: Sit to/from [employer] Sit to Stand: Mod assist Stand pivot transfers: Mod assist       General transfer comment: heavy post. lean, max cues for bringing trunk forward    Balance  Overall balance assessment: Needs assistance;History of Falls Sitting-balance support: No upper extremity supported;Feet supported Sitting balance-Leahy Scale: Poor   Postural control: Posterior lean Standing balance support: During functional activity;Bilateral upper extremity supported Standing balance-Leahy Scale: Poor Standing balance comment: Reliant on BUE on RW and external assist. Difficulty with anterior weight shift requiring max multimodal cues.                           ADL either performed or assessed with clinical judgement   ADL Overall ADL's : Needs assistance/impaired Eating/Feeding: Set up;Sitting Eating/Feeding Details (indicate cue type and reason): intermittent cues for sequencing Grooming: Wash/dry face;Supervision/safety;Sitting       Lower Body Bathing: Maximal assistance;Sit to/from stand Lower Body Bathing Details (indicate cue type and reason): heavy post. lean in standing with legs braced on chair, max cues for postional changes to reduce lean but pt. unable. was able to wash periarea in standing with step by step instructions Upper Body Dressing : Maximal assistance;Sitting Upper Body Dressing Details (indicate cue type and reason): to don new gown Lower Body Dressing: Minimal assistance;Sitting/lateral leans Lower Body Dressing Details (indicate cue type and reason): Patient able to doff/don footwear seated EOB with more than increased time. Cues for safety. Toilet Transfer: Moderate assistance;Stand-pivot Toilet Transfer Details (indicate cue type and reason): Simulated with transfer to recliner with use of RW and cues for foot placement, wider BOS, and proximity to chair. Patient requires more than increased time.         Functional mobility during ADLs: Minimal assistance;Rolling walker General ADL Comments: Patient making slow progress toward goals. Continues to be limited by decreased cognition including decreased anticipatory  awareness,  decreased balance and generalized weakness requiring Min to Max A grossly for completion of ADLs with cueing.     Vision       Perception     Praxis      Cognition Arousal/Alertness: Awake/alert Behavior During Therapy: WFL for tasks assessed/performed Overall Cognitive Status: Impaired/Different from baseline Area of Impairment: Orientation;Attention;Memory;Following commands;Safety/judgement;Awareness;Problem solving                 Orientation Level: Disoriented to;Time Current Attention Level: Sustained Memory: Decreased short-term memory Following Commands: Follows one step commands with increased time;Follows multi-step commands inconsistently Safety/Judgement: Decreased awareness of safety;Decreased awareness of deficits Awareness: Intellectual Problem Solving: Slow processing;Difficulty sequencing;Requires verbal cues;Decreased initiation;Requires tactile cues General Comments: Unable to recall month even with cues. Requires increased time to process verbal information. Tactile and verbal cues required to sequence familiar tasks.        Exercises     Shoulder Instructions       General Comments      Pertinent Vitals/ Pain       Pain Assessment: Faces Faces Pain Scale: Hurts a little bit Pain Location: chest/upper abdomen Pain Descriptors / Indicators: Sore Pain Intervention(s): Monitored during session  Home Living                                          Prior Functioning/Environment              Frequency  Min 2X/week        Progress Toward Goals  OT Goals(current goals can now be found in the care plan section)  Progress towards OT goals: Progressing toward goals (slowly)  Acute Rehab OT Goals Patient Stated Goal: to go home OT Goal Formulation: With patient Time For Goal Achievement: 02/07/21 Potential to Achieve Goals: Good ADL Goals Pt Will Perform Grooming: with supervision;standing Pt Will Perform Upper  Body Dressing: sitting;with set-up Pt Will Perform Lower Body Dressing: with supervision;sit to/from stand Pt Will Transfer to Toilet: with supervision;ambulating Pt Will Perform Toileting - Clothing Manipulation and hygiene: with supervision;sit to/from stand Pt/caregiver will Perform Home Exercise Program: Increased strength;Both right and left upper extremity;With theraband;With written HEP provided Additional ADL Goal #1: Patient will recall 3 fall risk reducing strategies in prep for safe d/c to next level of care.  Plan Discharge plan remains appropriate;Frequency remains appropriate    Co-evaluation                 AM-PAC OT "6 Clicks" Daily Activity     Outcome Measure   Help from another person eating meals?: None Help from another person taking care of personal grooming?: A Little Help from another person toileting, which includes using toliet, bedpan, or urinal?: A Little Help from another person bathing (including washing, rinsing, drying)?: A Little Help from another person to put on and taking off regular upper body clothing?: A Little Help from another person to put on and taking off regular lower body clothing?: A Little 6 Click Score: 19    End of Session Equipment Utilized During Treatment: Gait belt;Rolling walker  OT Visit Diagnosis: Unsteadiness on feet (R26.81);Other abnormalities of gait and mobility (R26.89);Muscle weakness (generalized) (M62.81)   Activity Tolerance Patient tolerated treatment well   Patient Left in chair;with call bell/phone within reach;Other (comment) (OTA in room)   Nurse Communication Other (comment) (rn states ok to work with  pt. today)        Time: 7614-7092 OT Time Calculation (min): 13 min  Charges: OT General Charges $OT Visit: 1 Visit OT Treatments $Self Care/Home Management : 8-22 mins  Salinda Snedeker H. OTR/L Supplemental OT, Department of rehab services [phone]   Kenyetta Wimbish R H. 01/24/2021, 10:58 AM

## 2021-01-24 NOTE — Progress Notes (Signed)
Occupational Therapy Treatment Patient Details Name: Sawyer Kahan MRN: [id number] DOB: [birth year] Today's Date: 01/24/2021    History of present illness Pt presents from home on 01/13/21 with multiple falls, confusion, and weakness. In ED blood glucose was 729 and pt was unable to ambulate.  Patient admitted with severe dehydration and metabolic encephalopathy. PMH: DM2, COPD, CKD3, GERD, bipolar disorder type 1, previous covid 19 infection, polysubstance abuse including cocaine.   OT comments  Pt. Seen for skilled OT treatment session following goal update from OTR.  Focus of todays session was multiple adls.  Pt. Completed grooming set up seated.  ub dressing min a.  Lb dressing min a.  Lb bathing max a in standing following incontinence of urine.  Pt. Pleasant and eager to participate.  Continues to require cues for sequencing and completion of tasks.  Continue with adls and HEP next session with focus on engaging core to reduce posterior lean when in standing.    Follow Up Recommendations  Supervision/Assistance - 24 hour;Home health OT    Equipment Recommendations  3 in 1 bedside commode;Other (comment)    Recommendations for Other Services      Precautions / Restrictions Precautions Precautions: Fall Restrictions Weight Bearing Restrictions: No       Mobility Bed Mobility Overal bed mobility: Needs Assistance Bed Mobility: Rolling;Sidelying to Sit Rolling: Min assist Sidelying to sit: Min assist            Transfers Overall transfer level: Needs assistance Equipment used: Rolling walker (2 wheeled) Transfers: Sit to/from [employer] Sit to Stand: Mod assist Stand pivot transfers: Mod assist       General transfer comment: heavy post. lean, max cues for bringing trunk forward    Balance                                           ADL either performed or assessed with clinical judgement   ADL Overall ADL's : Needs  assistance/impaired Eating/Feeding: Set up;Sitting Eating/Feeding Details (indicate cue type and reason): intermittent cues for sequencing Grooming: Wash/dry face;Supervision/safety;Sitting       Lower Body Bathing: Maximal assistance;Sit to/from stand Lower Body Bathing Details (indicate cue type and reason): heavy post. lean in standing with legs braced on chair, max cues for postional changes to reduce lean but pt. unable. was able to wash periarea in standing with step by step instructions Upper Body Dressing : Maximal assistance;Sitting Upper Body Dressing Details (indicate cue type and reason): to don new gown Lower Body Dressing: Minimal assistance;Sitting/lateral leans Lower Body Dressing Details (indicate cue type and reason): pt requried cues to sequence how to adjust, cues for safety awareness throughout-able to cross each leg over knee but bends forward. cues to cross legs as safer option for lb dressing             Functional mobility during ADLs: Minimal assistance;Rolling walker General ADL Comments: focus on adls seated and standing. pt. stated she did not have to use the b.room, while eating b.fast i noticed uring coming onto the floor. pt. was still eating without recognition to what was occuring. then she said "am i peeing". i answered yes.  i explained she had told us she did not need to use the b.room she replied "oh i did" and chuckled laughter and continued eating.     Vision  Perception     Praxis      Cognition Arousal/Alertness: Awake/alert Behavior During Therapy: WFL for tasks assessed/performed Overall Cognitive Status: Impaired/Different from baseline Area of Impairment: Orientation;Attention;Memory;Following commands;Safety/judgement;Awareness;Problem solving                 Orientation Level: Disoriented to;Time Current Attention Level: Sustained Memory: Decreased short-term memory Following Commands: Follows one step commands with  increased time;Follows multi-step commands inconsistently Safety/Judgement: Decreased awareness of safety;Decreased awareness of deficits Awareness: Intellectual Problem Solving: Slow processing;Difficulty sequencing;Requires verbal cues;Decreased initiation;Requires tactile cues General Comments: unable to recall month with cues provided of holidays that fall in may.  showing visible signs she was full but would not verbalize that she was finished or did not want anymore food until suggested.  began to urinate without knowing until therapist asst. mentioned it to her.  unable to sequence brushing teeth without step by step cues for how to  and also to move to next step of task        Exercises     Shoulder Instructions       General Comments      Pertinent Vitals/ Pain       Pain Assessment: Faces Faces Pain Scale: Hurts a little bit Pain Location: chest area Pain Descriptors / Indicators: Sore  Home Living                                          Prior Functioning/Environment              Frequency  Min 2X/week        Progress Toward Goals  OT Goals(current goals can now be found in the care plan section)  Progress towards OT goals: Progressing toward goals     Plan Discharge plan remains appropriate;Frequency remains appropriate    Co-evaluation                 AM-PAC OT "6 Clicks" Daily Activity     Outcome Measure   Help from another person eating meals?: None Help from another person taking care of personal grooming?: A Little Help from another person toileting, which includes using toliet, bedpan, or urinal?: A Little Help from another person bathing (including washing, rinsing, drying)?: A Little Help from another person to put on and taking off regular upper body clothing?: A Little Help from another person to put on and taking off regular lower body clothing?: A Little 6 Click Score: 19    End of Session Equipment  Utilized During Treatment: Gait belt;Rolling walker  OT Visit Diagnosis: Unsteadiness on feet (R26.81);Other abnormalities of gait and mobility (R26.89);Muscle weakness (generalized) (M62.81)   Activity Tolerance Patient tolerated treatment well   Patient Left in chair;with call bell/phone within reach   Nurse Communication Other (comment) (rn states ok to work with pt. today)        Time: 4166-0630 OT Time Calculation (min): 31 min  Charges: OT General Charges $OT Visit: 1 Visit OT Treatments $Self Care/Home Management : 23-37 mins  Boneta Lucks, COTA/L Acute Rehabilitation [phone]    01/24/2021, 8:53 AM

## 2021-01-24 NOTE — Progress Notes (Signed)
[facility] PROGRESS NOTE  Carolyn Norton XLK:[id number] DOB: [birth year] DOA: 01/11/2021 PCP: [name], FNP  Status: Remains inpatient appropriate because:Unsafe d/c plan  Dispo:  Patient From: Home  Planned Disposition: Home-patient agreeable to transition immediately from hospital to drug rehabilitation facility if can obtain a bed once she is physically medically stable for discharge  Medically stable for discharge: No  Barriers to DC: Continues to require significant assistance with mobility.             Difficult to place: Yes  Level of care: Med-Surg  Code Status: Full Family Communication:  DVT prophylaxis: Lovenox COVID vaccination status: Unknown   HPI: 55 year old female past medical history of diabetes mellitus type 2, bipolar disorder type I, previous COVID infection, polysubstance abuse that includes cocaine.  She presented from home where she lives with her parents.  Her presenting symptoms included generalized weakness, multiple falls and confusion.  Family confirmed that she had been having the symptoms for at least a month with last use of crack cocaine 1 month prior to presentation.  Ever since that time she had not been doing well.  She would also be having issues with hyperglycemia.  In the ER she was diagnosed with severe dehydration, hyperglycemia and metabolic encephalopathy.  Since admission patient has been evaluated by the psychiatric team who has deemed her alert and oriented and having capacity to make decisions.  Patient is agreeable to placement at a rehab.  Currently she has severe physical deconditioning which was POA and PT is recommending SNF.  Because of her history of polysubstance abuse and lack of funding SNF is not an option and current plan is to provide aggressive physical rehabilitation in hopes patient will be physically ready to discharge to a drug rehabilitation center.  Of note patient's family has refused to accept her back  into the home.  Subjective: Alert.  No complaints.  For we will be transitioning her from insulin to oral hypoglycemic agents.  Objective: Vitals:   01/23/21 2047 01/24/21 0445  BP: 121/66 128/65  Pulse: 65 78  Resp: 19 20  Temp: 98.5 F (36.9 C) 98.6 F (37 C)  SpO2: 91% 93%    Intake/Output Summary (Last 24 hours) at 01/24/2021 0750 Last data filed at 01/24/2021 0500 Gross per 24 hour  Intake --  Output 1100 ml  Net -1100 ml   Filed Weights   01/11/21 1152  Weight: 99.8 kg    Exam:  Constitutional: Awake, sitting up in chair, calm Respiratory: Lungs are clear, room air Cardiovascular: Heart sounds, no peripheral edema, regular pulse Abdomen: LBM 5/15, nontender nondistended with normal active bowel sounds.  Eating well Neurologic: CN 2-12 grossly intact. Sensation intact, DTR normal. Strength 5/5 x all 4 extremities.  Continues to have significant ambulatory dysfunction and gait disturbance secondary to severe physical deconditioning Psychiatric: Alert, pleasant, oriented x3   Assessment/Plan: Acute problems: Acute metabolic encephalopathy (multifactorial): Severe dehydration in context of poorly controlled diabetes mellitus with hyperglycemia Likely cocaine withdrawal and poor nutrition POA contributed Acute encephalopathy now resolved  Diabetes mellitus 2 with hyperglycemia on insulin Hemoglobin A1c 11.0 Episode of profound hypoglycemia in the past 24 hours-likely secondary to overcorrection from short acting insulin Patient was not on insulin prior to admission and given her psych history it is probably safer to use oral hypoglycemics therefore Lantus and meal coverage have been discontinued in favor of metformin Continue SSI  Dyslipidemia Continue statin  History of bipolar 1 disorder/polysubstance abuse Continue escitalopram, Neurontin  and Depakote LFTs normal Cleared by psych regarding orientation and capacity Patient is willing to be evaluated and  treated at drug rehabilitation facility after discharge    Other problems: Acute kidney injury on CKD 3a Acute kidney injury resolved after IV fluids Baseline creatinine 1.3  COPD Continue Singulair, nebs as needed, consider adding LABA  GERD Continue PPI  Hypokalemia/hypomagnesemia Resolved  Data Reviewed: Basic Metabolic Panel: Recent Labs  Lab 01/21/21 0335 01/23/21 1302  NA 135  --   K 4.0  --   CL 96*  --   CO2 30  --   GLUCOSE 96 534*  BUN 22*  --   CREATININE 1.12*  --   CALCIUM 9.3  --   MG 1.9  --    Liver Function Tests: No results for input(s): AST, ALT, ALKPHOS, BILITOT, PROT, ALBUMIN in the last 168 hours. No results for input(s): LIPASE, AMYLASE in the last 168 hours. No results for input(s): AMMONIA in the last 168 hours. CBC: Recent Labs  Lab 01/21/21 0335  WBC 5.9  HGB 13.3  HCT 39.8  MCV 87.1  PLT 185   Cardiac Enzymes: No results for input(s): CKTOTAL, CKMB, CKMBINDEX, TROPONINI in the last 168 hours. BNP (last 3 results) Recent Labs    01/14/21 0238 01/15/21 0230 01/16/21 0201  BNP 177.8* 115.9* 40.9    ProBNP (last 3 results) No results for input(s): PROBNP in the last 8760 hours.  CBG: Recent Labs  Lab 01/23/21 1626 01/23/21 1850 01/23/21 2151 01/23/21 2225 01/24/21 0720  GLUCAP 274* 162* 23* 137* 201*    No results found for this or any previous visit (from the past 240 hour(s)).   Studies: No results found.  Scheduled Meds: . aspirin  81 mg Oral Daily  . atorvastatin  20 mg Oral Daily  . divalproex  500 mg Oral BID  . enoxaparin (LOVENOX) injection  40 mg Subcutaneous Daily  . escitalopram  20 mg Oral Daily  . gabapentin  300 mg Oral BID  . insulin aspart  0-9 Units Subcutaneous TID WC  . metFORMIN  500 mg Oral BID WC  . montelukast  10 mg Oral QHS  . pantoprazole  40 mg Oral Daily   Continuous Infusions:  Principal Problem:   Bipolar 1 disorder, depressed, full remission (HCC) Active Problems:    Bipolar 1 disorder (HCC)   Generalized weakness   Consultants:  Psychiatry  Procedures:  None  Antibiotics: Anti-infectives (From admission, onward)   None       Time spent: 35 minutes    [name] ANP  [facility] 7 am - 330 pm/M-F for direct patient care and secure chat Please refer to Amion for contact info 12  days

## 2021-01-24 NOTE — Progress Notes (Signed)
Came back to see patient at nurse's request.  Patient awake, pleasant and cooperative.  She did say she was sleepy when asked.  Did not eat lunch.  Also said she "loved" everyone here.  Will get ammonia and basic labs for now including TSH. Vitals stable. Marlin Canary DO

## 2021-01-24 NOTE — Progress Notes (Signed)
Physical Therapy Treatment Patient Details Name: Carolyn Norton MRN: [id number] DOB: 1966/02/22 Today's Date: 01/24/2021    History of Present Illness Pt presents from home on 01/13/21 with multiple falls, confusion, and weakness. In ED blood glucose was 729 and pt was unable to ambulate.  Patient admitted with severe dehydration and metabolic encephalopathy. PMH: DM2, COPD, CKD3, GERD, bipolar disorder type 1, previous covid 19 infection, polysubstance abuse including cocaine.    PT Comments    Pt supine in bed agreeable to working with therapy. Pt can not recall therapist name, or the fact that she worked with OT this morning. Pt able to get to the EoB with min A bed continues to require maximal cuing for anterior lean. Focus of session use of Stedy to encourage forward lean with standing. Pt requires modA to min guard for power up to [facility] of which she did 5-7 times. While in [place] worked on weightshifting, reaching, lifting LE. Throughout session requires constant cuing for awareness to positioning of body. Pt able to correct but not maintain CoG over BoS. Pt has received 5 days of intensive PT and OT with little evidence of progression and no evidence of carryover between or even in session. PT continues to recommend SNF level rehab at discharge and will return her therapy frequency to prior level.     Follow Up Recommendations  SNF     Equipment Recommendations  Rolling walker with 5" wheels;3in1 (PT)       Precautions / Restrictions Precautions Precautions: Fall    Mobility  Bed Mobility Overal bed mobility: Needs Assistance       Supine to sit: Min assist;HOB elevated Sit to supine: Min assist   General bed mobility comments: pt with no problem getting LE off bed however then slides torso around to square with EoB and requires min A, 2 attempts and maximal cuing to bring trunk to upright    Transfers Overall transfer level: Needs assistance   Transfers: Sit to/from  Stand;Stand Pivot Transfers Sit to Stand: Mod assist;Min assist;Min guard         General transfer comment: utilized Stedy to encourage forward lean, stood at least 7 times and pt able to progress from modA to min guard but requires increased cuing every time        Balance Overall balance assessment: Needs assistance;History of Falls Sitting-balance support: No upper extremity supported;Feet supported Sitting balance-Leahy Scale: Poor   Postural control: Posterior lean Standing balance support: During functional activity;Bilateral upper extremity supported Standing balance-Leahy Scale: Poor Standing balance comment: Reliant on BUE on RW and external assist. Difficulty with anterior weight shift requiring max multimodal cues.                            Cognition Arousal/Alertness: Awake/alert Behavior During Therapy: WFL for tasks assessed/performed Overall Cognitive Status: Impaired/Different from baseline Area of Impairment: Orientation;Attention;Memory;Following commands;Safety/judgement;Awareness;Problem solving                 Orientation Level: Disoriented to;Time Current Attention Level: Sustained Memory: Decreased short-term memory Following Commands: Follows one step commands with increased time;Follows multi-step commands inconsistently Safety/Judgement: Decreased awareness of safety;Decreased awareness of deficits Awareness: Intellectual Problem Solving: Slow processing;Difficulty sequencing;Requires verbal cues;Decreased initiation;Requires tactile cues General Comments: Unable to recall month even with cues. Requires increased time to process verbal information. Tactile and verbal cues required to sequence familiar tasks.      Exercises Other Exercises Other Exercises: utilized  standing in [place] to encourage anterior lean, worked on weight shifts, stepping, reaching forward and overhead worked, required seated rest breaks due to fatigue     General Comments General comments (skin integrity, edema, etc.): VSS on RA, pt not as bubbly and reports feeling a little down      Pertinent Vitals/Pain Pain Assessment: Faces Faces Pain Scale: Hurts a little bit Pain Location: generalized Pain Descriptors / Indicators: Sore Pain Intervention(s): Limited activity within patient's tolerance;Monitored during session;Repositioned           PT Goals (current goals can now be found in the care plan section) Acute Rehab PT Goals Patient Stated Goal: to go home PT Goal Formulation: With patient Time For Goal Achievement: 01/26/21 Potential to Achieve Goals: Fair Progress towards PT goals: Not progressing toward goals - comment (limited by decreased carryover)    Frequency    Min 2X/week      PT Plan Frequency needs to be updated       AM-PAC PT "6 Clicks" Mobility   Outcome Measure  Help needed turning from your back to your side while in a flat bed without using bedrails?: None Help needed moving from lying on your back to sitting on the side of a flat bed without using bedrails?: A Little Help needed moving to and from a bed to a chair (including a wheelchair)?: A Lot Help needed standing up from a chair using your arms (e.g., wheelchair or bedside chair)?: A Lot Help needed to walk in hospital room?: Total Help needed climbing 3-5 steps with a railing? : Total 6 Click Score: 13    End of Session Equipment Utilized During Treatment: Gait belt Activity Tolerance: Patient tolerated treatment well Patient left: in bed;with call bell/phone within reach;with bed alarm set Nurse Communication: Mobility status PT Visit Diagnosis: Muscle weakness (generalized) (M62.81);Repeated falls (R29.6);Difficulty in walking, not elsewhere classified (R26.2);Unsteadiness on feet (R26.81)     Time: 7017-7939 PT Time Calculation (min) (ACUTE ONLY): 41 min  Charges:  $Therapeutic Exercise: 38-52 mins                     Kasia Trego B.  Beverely Risen PT, DPT Acute Rehabilitation Services Pager [phone] Office [phone]    [name] Fleet 01/24/2021, 4:31 PM

## 2021-01-25 LAB — TSH: TSH: 1.75 u[IU]/mL (ref 0.350–4.500)

## 2021-01-25 LAB — GLUCOSE, CAPILLARY
Glucose-Capillary: 136 mg/dL — ABNORMAL HIGH (ref 70–99)
Glucose-Capillary: 363 mg/dL — ABNORMAL HIGH (ref 70–99)
Glucose-Capillary: 282 mg/dL — ABNORMAL HIGH (ref 70–99)
Glucose-Capillary: 61 mg/dL — ABNORMAL LOW (ref 70–99)
Glucose-Capillary: 75 mg/dL (ref 70–99)

## 2021-01-25 NOTE — Plan of Care (Signed)
Diagnostic tests will improve- Medication adjusted to manage DM

## 2021-01-25 NOTE — Progress Notes (Signed)
Hypoglycemic Event  CBG: 61  Treatment: 1 cup of orange juice   Symptoms: Asymptomatic   Follow-up CBG: Time: 2200   CBG Result:75   Possible Reasons for Event: None   Comments/MD notified: Provided bed time snacks.  No    Milon Score, RN

## 2021-01-25 NOTE — Progress Notes (Signed)
Patient on the LLS team.  Here as she needs a safe DC plan.  Blood sugars stable.  Labs from yesterday showed a mild increase in Cr.  Encouraged PO intake. Marlin Canary DO

## 2021-01-25 NOTE — Plan of Care (Signed)
  Problem: Health Behavior/Discharge Planning: Goal: Ability to manage health-related needs will improve Outcome: Progressing   Problem: Elimination: Goal: Will not experience complications related to bowel motility Outcome: Progressing Goal: Will not experience complications related to urinary retention Outcome: Progressing   Problem: Pain Managment: Goal: General experience of comfort will improve Outcome: Progressing   Problem: Safety: Goal: Ability to remain free from injury will improve Outcome: Progressing   Problem: Skin Integrity: Goal: Risk for impaired skin integrity will decrease Outcome: Progressing   

## 2021-01-26 LAB — GLUCOSE, CAPILLARY
Glucose-Capillary: 235 mg/dL — ABNORMAL HIGH (ref 70–99)
Glucose-Capillary: 205 mg/dL — ABNORMAL HIGH (ref 70–99)
Glucose-Capillary: 69 mg/dL — ABNORMAL LOW (ref 70–99)
Glucose-Capillary: 69 mg/dL — ABNORMAL LOW (ref 70–99)
Glucose-Capillary: 175 mg/dL — ABNORMAL HIGH (ref 70–99)
Glucose-Capillary: 298 mg/dL — ABNORMAL HIGH (ref 70–99)

## 2021-01-26 MED ORDER — DEXTROSE 50 % IV SOLN
INTRAVENOUS | Status: AC
  Filled 2021-01-26: qty 50

## 2021-01-26 MED ORDER — SODIUM CHLORIDE 0.9 % IV SOLN: INTRAVENOUS | Status: AC

## 2021-01-26 NOTE — Plan of Care (Signed)
  Problem: Elimination: Goal: Will not experience complications related to urinary retention Outcome: Progressing   Problem: Pain Managment: Goal: General experience of comfort will improve Outcome: Progressing   Problem: Safety: Goal: Ability to remain free from injury will improve Outcome: Progressing   

## 2021-01-26 NOTE — Progress Notes (Signed)
Patient on the LLS team.  Still here as she needs a safe DC plan.  1 episode of low blood sugars.  Seems to have poor PO intake.  + BM documented.  Labs showed a mild increase in Cr.  Will do gentle IVF overnight.  Recheck labs in AM [employer] DO

## 2021-01-27 LAB — BASIC METABOLIC PANEL
Anion gap: 7 (ref 5–15)
BUN: 16 mg/dL (ref 6–20)
CO2: 29 mmol/L (ref 22–32)
Calcium: 8.9 mg/dL (ref 8.9–10.3)
Chloride: 98 mmol/L (ref 98–111)
Creatinine, Ser: 1.07 mg/dL — ABNORMAL HIGH (ref 0.44–1.00)
GFR, Estimated: >60 mL/min (ref >60)
Glucose, Bld: 330 mg/dL — ABNORMAL HIGH (ref 70–99)
Potassium: 4.2 mmol/L (ref 3.5–5.1)
Sodium: 134 mmol/L — ABNORMAL LOW (ref 135–145)

## 2021-01-27 LAB — GLUCOSE, CAPILLARY
Glucose-Capillary: 297 mg/dL — ABNORMAL HIGH (ref 70–99)
Glucose-Capillary: 334 mg/dL — ABNORMAL HIGH (ref 70–99)
Glucose-Capillary: 211 mg/dL — ABNORMAL HIGH (ref 70–99)
Glucose-Capillary: 164 mg/dL — ABNORMAL HIGH (ref 70–99)

## 2021-01-27 LAB — VITAMIN B12: Vitamin B-12: 707 pg/mL (ref 180–914)

## 2021-01-27 NOTE — Progress Notes (Signed)
Set up pt.'s breakfast and encouraged pt. To eat breakfast, came back around 30 minutes later to give medications and pt. Had not eaten nor drank anything. I asked pt. If she was hungry, pt. Said yes. Pt. Verbalized that at home, mom and dad feed her. Pt. Was fed breakfast. MD notified.

## 2021-01-27 NOTE — Progress Notes (Signed)
Physical Therapy Treatment Patient Details Name: Carolyn Norton MRN: [id number] DOB: [birth year] Today's Date: 01/27/2021    History of Present Illness Pt presents from home on 01/13/21 with multiple falls, confusion, and weakness. In ED blood glucose was 729 and pt was unable to ambulate.  Patient admitted with severe dehydration and metabolic encephalopathy. PMH: DM2, COPD, CKD3, GERD, bipolar disorder type 1, previous covid 19 infection, polysubstance abuse including cocaine.    PT Comments    Pt continues to require assist for all mobility. Even though pt doesn't require heavy assist once she is on her feet for gait, he gait is of very poor quality and functionality (took her 10 minutes to amb 12').    Follow Up Recommendations  SNF     Equipment Recommendations  Rolling walker with 5" wheels;3in1 (PT)    Recommendations for Other Services       Precautions / Restrictions Precautions Precautions: Fall    Mobility  Bed Mobility Overal bed mobility: Needs Assistance       Supine to sit: Min assist;HOB elevated     General bed mobility comments: Assist to elevate trunk into sitting. Pt with no initiation to move trunk upright. Verbal/tactile cues for all steps of mobility.    Transfers Overall transfer level: Needs assistance Equipment used: Rolling walker (2 wheeled) Transfers: Sit to/from [employer] Sit to Stand: Mod assist;Min assist         General transfer comment: Assist to bring hips up and to correct posterior lean. First attempt from bed required mod assist. Second attempt with min assist. Had pt place hands on walker to encourage forward weight shift.  Ambulation/Gait Ambulation/Gait assistance: Min assist Gait Distance (Feet): 12 Feet Assistive device: Rolling walker (2 wheeled) Gait Pattern/deviations: Step-to pattern;Step-through pattern;Shuffle;Trunk flexed Gait velocity: extremely slow - At least 10 minutes to amb to the  door Gait velocity interpretation: <1.31 ft/sec, indicative of household ambulator General Gait Details: Assist for balance and support. Verbal/tactile/visual cues to incr step length and stay closer to walker without any improvement.   Stairs             Wheelchair Mobility    Modified Rankin (Stroke Patients Only)       Balance Overall balance assessment: Needs assistance;History of Falls Sitting-balance support: Feet supported;Bilateral upper extremity supported Sitting balance-Leahy Scale: Poor Sitting balance - Comments: UE support and min guard Postural control: Posterior lean Standing balance support: During functional activity;Bilateral upper extremity supported Standing balance-Leahy Scale: Poor Standing balance comment: walker and mod assist initially due to posterior bias and min assist once wt shifted anteriorly                            Cognition Arousal/Alertness: Awake/alert Behavior During Therapy: WFL for tasks assessed/performed Overall Cognitive Status: Impaired/Different from baseline Area of Impairment: Orientation;Attention;Memory;Following commands;Safety/judgement;Awareness;Problem solving                 Orientation Level: Disoriented to;Time Current Attention Level: Sustained Memory: Decreased short-term memory Following Commands: Follows one step commands with increased time;Follows multi-step commands inconsistently Safety/Judgement: Decreased awareness of safety;Decreased awareness of deficits Awareness: Intellectual Problem Solving: Slow processing;Difficulty sequencing;Requires verbal cues;Decreased initiation;Requires tactile cues        Exercises      General Comments        Pertinent Vitals/Pain Pain Assessment: No/denies pain    Home Living  Prior Function            PT Goals (current goals can now be found in the care plan section) Acute Rehab PT Goals Patient Stated  Goal: to go home PT Goal Formulation: Patient unable to participate in goal setting Time For Goal Achievement: 02/10/21 Potential to Achieve Goals: Fair Progress towards PT goals: Goals downgraded-see care plan    Frequency    Min 2X/week      PT Plan Current plan remains appropriate    Co-evaluation              AM-PAC PT "6 Clicks" Mobility   Outcome Measure  Help needed turning from your back to your side while in a flat bed without using bedrails?: None Help needed moving from lying on your back to sitting on the side of a flat bed without using bedrails?: A Little Help needed moving to and from a bed to a chair (including a wheelchair)?: A Lot Help needed standing up from a chair using your arms (e.g., wheelchair or bedside chair)?: A Lot Help needed to walk in hospital room?: A Little Help needed climbing 3-5 steps with a railing? : Total 6 Click Score: 15    End of Session Equipment Utilized During Treatment: Gait belt Activity Tolerance: Patient tolerated treatment well Patient left: with call bell/phone within reach;in chair;with chair alarm set Nurse Communication: Mobility status;Need for lift equipment (Stedy likely more efficient) PT Visit Diagnosis: Muscle weakness (generalized) (M62.81);Repeated falls (R29.6);Difficulty in walking, not elsewhere classified (R26.2);Unsteadiness on feet (R26.81)     Time: 0175-1025 PT Time Calculation (min) (ACUTE ONLY): 34 min  Charges:  $Gait Training: 23-37 mins                     [facility] PT Acute Rehabilitation Services Pager [phone] Office [phone]    Angelina Ok [facility] 01/27/2021, 5:05 PM

## 2021-01-27 NOTE — Plan of Care (Signed)
  Problem: Safety: Goal: Ability to remain free from injury will improve Outcome: Progressing   Problem: Skin Integrity: Goal: Risk for impaired skin integrity will decrease Outcome: Progressing   

## 2021-01-27 NOTE — Progress Notes (Addendum)
CSW spoke with patient's mother [name] who states the patient can return to the home once she is able to ambulate and toilet independently. [name] states the only DME the patient has at home is a cane and that the house has walker accessibility.  Edwin Dada, MSW, LCSW Transitions of Care  Clinical Social Worker II [phone]

## 2021-01-27 NOTE — Plan of Care (Signed)

## 2021-01-27 NOTE — Progress Notes (Signed)
[facility] PROGRESS NOTE  Carolyn Norton EHM:[id number] DOB: [birth year] DOA: 01/11/2021 PCP: Carolyn Norton  Status: Remains inpatient appropriate because:Unsafe d/c plan  Dispo:  Patient From:  Home  Planned Disposition:  Home-family has agreed to take patient back when she is medically stable and able to mobilize independently with minimal assistance.  Medically stable for discharge:    Barriers to DC: Continues to require significant assistance with mobility.             Difficult to place: Yes  Level of care: Med-Surg  Code Status: Full Family Communication:  DVT prophylaxis: Lovenox COVID vaccination status: Unknown   HPI: 55 year old female past medical history of diabetes mellitus type 2, bipolar disorder type I, previous COVID infection, polysubstance abuse that includes cocaine.  She presented from home where she lives with her parents.  Her presenting symptoms included generalized weakness, multiple falls and confusion.  Family confirmed that she had been having the symptoms for at least a month with last use of crack cocaine 1 month prior to presentation.  Ever since that time she had not been doing well.  She would also be having issues with hyperglycemia.  In the ER she was diagnosed with severe dehydration, hyperglycemia and metabolic encephalopathy.  Since admission patient has been evaluated by the psychiatric team who has deemed her alert and oriented and having capacity to make decisions.  Patient is agreeable to placement at a rehab.  Currently she has severe physical deconditioning which was POA and PT is recommending SNF.  Because of her history of polysubstance abuse and lack of funding SNF is not an option and current plan is to provide aggressive physical rehabilitation in hopes patient will be physically ready to discharge to a drug rehabilitation center.  Of note patient's family has refused to accept her back into the home.  Subjective: Alert,  attempting to eat breakfast.  Having difficulty eating breakfast independently.  Discussed with patient need to clarify neurocognitive state regarding functional abilities before discharge.  Objective: Vitals:   01/26/21 1215 01/26/21 2152  BP: 116/68 117/68  Pulse: (!) 58 68  Resp: 17 18  Temp:  98 F (36.7 C)  SpO2: 98% 94%    Intake/Output Summary (Last 24 hours) at 01/27/2021 0800 Last data filed at 01/26/2021 2211 Gross per 24 hour  Intake 625 ml  Output 1400 ml  Net -775 ml   Filed Weights   01/11/21 1152  Weight: 99.8 kg    Exam:  Constitutional: Alert, calm in no acute distress Respiratory: Lung sounds are clear, stable on room air. Cardiovascular: Normal heart sounds, regular pulse, no peripheral edema. Abdomen: LBM 5/22, soft nontender nondistended.  Poor oral intake.  Combination of lack of appetite and difficulty feeding self Neurologic: CN 2-12 grossly intact. Sensation intact, DTR normal. Strength 4/5 x all 4 extremities.  Continues to have significant ambulatory dysfunction and gait disturbance secondary to severe physical deconditioning Psychiatric: Alert and oriented x3 but clearly has short-term memory deficits outlined and recent nursing notes and therapy notes.   Assessment/Plan: Acute problems: Acute metabolic encephalopathy (multifactorial): Severe dehydration in context of poorly controlled diabetes mellitus with hyperglycemia Likely cocaine withdrawal and poor nutrition POA contributed Acute encephalopathy now resolved  Diabetes mellitus 2 with hyperglycemia on insulin Hemoglobin A1c 11.0 Episode of profound hypoglycemia in the past 24 hours-likely secondary to overcorrection from short acting insulin Patient was not on insulin prior to admission and given her psych history it is probably safer to  use oral hypoglycemics therefore Lantus and meal coverage have been discontinued in favor of metformin Continue SSI  Dyslipidemia Continue  statin  History of bipolar 1 disorder/polysubstance abuse/cognitive impairment Continue escitalopram, Neurontin and Depakote LFTs normal Cleared by psych regarding orientation and capacity Patient clearly has short-term memory deficits that appear to be affecting her functional abilities therefore have asked SLP to perform SLUMS evaluation and OT to perform a short Blessed test [name] can go home with family when she is stable from a mobility standpoint.  Unfortunately her progress with PT has been slow when she has been discharged from the aggressive PT protocol and will receive standard PT for the duration of her therapy    Other problems: Acute kidney injury on CKD 3a Acute kidney injury resolved after IV fluids Baseline creatinine 1.3  COPD Continue Singulair, nebs as needed, consider adding LABA  GERD Continue PPI  Hypokalemia/hypomagnesemia Resolved  Data Reviewed: Basic Metabolic Panel: Recent Labs  Lab 01/21/21 0335 01/23/21 1302 01/24/21 1701 01/27/21 0351  NA 135  --  133* 134*  K 4.0  --  4.0 4.2  CL 96*  --  97* 98  CO2 30  --  30 29  GLUCOSE 96 534* 203* 330*  BUN 22*  --  22* 16  CREATININE 1.12*  --  1.26* 1.07*  CALCIUM 9.3  --  8.9 8.9  MG 1.9  --   --   --    Liver Function Tests: No results for input(s): AST, ALT, ALKPHOS, BILITOT, PROT, ALBUMIN in the last 168 hours. No results for input(s): LIPASE, AMYLASE in the last 168 hours. Recent Labs  Lab 01/24/21 1701  AMMONIA 28   CBC: Recent Labs  Lab 01/21/21 0335 01/24/21 1701  WBC 5.9 6.4  HGB 13.3 12.5  HCT 39.8 37.7  MCV 87.1 87.5  PLT 185 199   Cardiac Enzymes: No results for input(s): CKTOTAL, CKMB, CKMBINDEX, TROPONINI in the last 168 hours. BNP (last 3 results) Recent Labs    01/14/21 0238 01/15/21 0230 01/16/21 0201  BNP 177.8* 115.9* 40.9    ProBNP (last 3 results) No results for input(s): PROBNP in the last 8760 hours.  CBG: Recent Labs  Lab 01/26/21 1646  01/26/21 1714 01/26/21 1747 01/26/21 2206 01/27/21 0751  GLUCAP 69* 69* 175* 298* 297*    No results found for this or any previous visit (from the past 240 hour(s)).   Studies: No results found.  Scheduled Meds: . aspirin  81 mg Oral Daily  . atorvastatin  20 mg Oral Daily  . divalproex  500 mg Oral BID  . enoxaparin (LOVENOX) injection  40 mg Subcutaneous Daily  . escitalopram  20 mg Oral Daily  . gabapentin  300 mg Oral BID  . insulin aspart  0-9 Units Subcutaneous TID WC  . metFORMIN  500 mg Oral BID WC  . montelukast  10 mg Oral QHS  . pantoprazole  40 mg Oral Daily   Continuous Infusions:  Principal Problem:   Bipolar 1 disorder, depressed, full remission (HCC) Active Problems:   Bipolar 1 disorder (HCC)   Generalized weakness   Consultants:  Psychiatry  Procedures:  None  Antibiotics: Anti-infectives (From admission, onward)   None       Time spent: 35 minutes    [name] ANP  [facility] 7 am - 330 pm/M-F for direct patient care and secure chat Please refer to Amion for contact info 15  days

## 2021-01-28 LAB — GLUCOSE, CAPILLARY
Glucose-Capillary: 263 mg/dL — ABNORMAL HIGH (ref 70–99)
Glucose-Capillary: 218 mg/dL — ABNORMAL HIGH (ref 70–99)
Glucose-Capillary: 131 mg/dL — ABNORMAL HIGH (ref 70–99)
Glucose-Capillary: 228 mg/dL — ABNORMAL HIGH (ref 70–99)

## 2021-01-28 NOTE — Plan of Care (Signed)

## 2021-01-28 NOTE — Plan of Care (Signed)
  Problem: Elimination: Goal: Will not experience complications related to bowel motility Outcome: Progressing   Problem: Elimination: Goal: Will not experience complications related to urinary retention Outcome: Progressing   Problem: Pain Managment: Goal: General experience of comfort will improve Outcome: Progressing   

## 2021-01-28 NOTE — Progress Notes (Signed)
Occupational Therapy Treatment Patient Details Name: Carolyn Norton MRN: [id number] DOB: 13-Jul-1966 Today's Date: 01/28/2021    History of present illness Pt presents from home on 01/13/21 with multiple falls, confusion, and weakness. In ED blood glucose was 729 and pt was unable to ambulate.  Patient admitted with severe dehydration and metabolic encephalopathy. PMH: DM2, COPD, CKD3, GERD, bipolar disorder type 1, previous covid 19 infection, polysubstance abuse including cocaine.   OT comments  On entry to room, pt was reclined in chair with hips almost on leg rest. Pt unaware of almost falling out of chair. Attempted to reposition in the chair by bringing pt into seated position, however pt with significant posterior lean, requiring total A to attempt to correct posture to attempt transfer. Pt with no awareness of needing to reposition. Chair reclined and total A+2 needed to scoot pt up and safely reposition in chair. Pt assessed with the Short Blessed Test (SBT) of cognition. Normal range is 0-4; 5 - 9 Questionable Impairment; 10-28 - Impaired cognition. Pt received a score of 28. Discussed plan of care with COTA, who had a conversation with the Mom who indicated that Carolyn Norton would need to be completely independent with toileting in order to return home. Pt currently unaware of being incontinent and has significant cognitive impairment impacting her ability to carry over learned tasks from sessions. At this time recommend SNF for rehab however will continue to work toward goals of becoming more independent with self-care.   Follow Up Recommendations  SNF;Supervision/Assistance - 24 hour    Equipment Recommendations  3 in 1 bedside commode    Recommendations for Other Services      Precautions / Restrictions Precautions Precautions: Fall Restrictions Weight Bearing Restrictions: No       Mobility Transfer - pt sliding forward in chair. Unable to reposition in chair due to safety  concerns of pt sliding sliding out of chair. Required Total A +2 to reposition in chair as pt was not following commands to help reposition       General transfer comment: pt unable to stand without assist, MIN A for rise from EOB with cues for hand placement    Balance Overall balance assessment: Needs assistance;History of Falls Sitting-balance support: Feet supported;Bilateral upper extremity supported Sitting balance-Leahy Scale: Zero                            ADL either performed or assessed with clinical judgement   ADL     Vision       Perception     Praxis      Cognition Arousal/Alertness: Awake/alert Behavior During Therapy: Flat affect Overall Cognitive Status: Impaired/Different from baseline Area of Impairment: Orientation;Attention;Memory;Following commands;Safety/judgement;Awareness;Problem solving                 Orientation Level: Disoriented to;Situation;Place;Time Current Attention Level: Sustained Memory: Decreased short-term memory Following Commands: Follows one step commands with increased time Safety/Judgement: Decreased awareness of safety;Decreased awareness of deficits Awareness: Intellectual Problem Solving: Slow processing;Decreased initiation;Difficulty sequencing;Requires verbal cues;Requires tactile cues General Comments: scored a 28/28 on the Short Blessed Test        Exercises     Shoulder Instructions       General Comments Required +2 total A to reposition in chair due to hips being at footrest adn unable to scoot back into chair due to significatn posterior lean    Pertinent Vitals/ Pain  Pain Assessment: Faces Faces Pain Scale: No hurt Pain Location: teeth Pain Descriptors / Indicators: Sore Pain Intervention(s): Monitored during session  Home Living                                          Prior Functioning/Environment              Frequency  Min 2X/week         Progress Toward Goals  OT Goals(current goals can now be found in the care plan section)  Progress towards OT goals: Progressing toward goals  Acute Rehab OT Goals Patient Stated Goal: none stated OT Goal Formulation: Patient unable to participate in goal setting Time For Goal Achievement: 02/07/21 Potential to Achieve Goals: Fair ADL Goals Pt Will Perform Grooming: with supervision;standing Pt Will Perform Upper Body Dressing: sitting;with set-up Pt Will Perform Lower Body Dressing: with supervision;sit to/from stand Pt Will Transfer to Toilet: with supervision;ambulating Pt Will Perform Toileting - Clothing Manipulation and hygiene: with supervision;sit to/from stand Pt/caregiver will Perform Home Exercise Program: Increased strength;Both right and left upper extremity;With theraband;With written HEP provided Additional ADL Goal #1: Patient will recall 3 fall risk reducing strategies in prep for safe d/c to next level of care.  Plan Frequency remains appropriate;Discharge plan needs to be updated    Co-evaluation                 AM-PAC OT "6 Clicks" Daily Activity     Outcome Measure   Help from another person eating meals?: None Help from another person taking care of personal grooming?: A Little Help from another person toileting, which includes using toliet, bedpan, or urinal?: A Lot Help from another person bathing (including washing, rinsing, drying)?: A Lot Help from another person to put on and taking off regular upper body clothing?: A Little Help from another person to put on and taking off regular lower body clothing?: A Little 6 Click Score: 17 - score from earlier OT session with COTA; not assessed this session    End of Session Equipment Utilized During Treatment: Gait belt;Rolling walker  OT Visit Diagnosis: Unsteadiness on feet (R26.81);Other abnormalities of gait and mobility (R26.89);Muscle weakness (generalized) (M62.81);Other symptoms and signs  involving cognitive function;Other symptoms and signs involving the nervous system (R29.898)   Activity Tolerance Patient tolerated treatment well   Patient Left in chair;with call bell/phone within reach;with chair alarm set   Nurse Communication Mobility status;Other (comment) (cognition)        Time: 2426-8341 OT Time Calculation (min): 20 min  Charges: OT General Charges $OT Visit: 1 Visit OT Treatments $Self Care/Home Management : 23-37 mins $Therapeutic Activity: 8-22 mins  Luisa Dago, OT/L   Acute OT Clinical Specialist Acute Rehabilitation Services Pager [phone] Office [phone]    [facility] 01/28/2021, 4:42 PM

## 2021-01-28 NOTE — Progress Notes (Signed)
Patient will not eat on her on. Patient held the fork in her hand but she would just hold it in her hand and doesn't really eat. I go in and encourage her several times to eat but she doesn't eat anything from her tray. She also leans over to the right side and doesn't even attempt to center herself back. I went in to sit her straight and she stays sitting up for a few minutes then gradually starts leaning back to the right or left side again. Mom called very concerned stating that her daughter isn't answering the phone and this isn't like her and the times she does talk to her she seems as though she is confused. Mom also stated that at home, patient would feed herself and do everything for herself and here patient does not do those things anymore. Mom states she is worried that her daughter is declining. I addressed all these concerns with MD J. Benjamine Mola, whom told me to address this with NP A. Rennis Harding. She was informed as well.

## 2021-01-28 NOTE — Progress Notes (Signed)
Occupational Therapy Treatment Patient Details Name: Carolyn Norton MRN: [id number] DOB: 21-Dec-1965 Today's Date: 01/28/2021    History of present illness Pt presents from home on 01/13/21 with multiple falls, confusion, and weakness. In ED blood glucose was 729 and pt was unable to ambulate.  Patient admitted with severe dehydration and metabolic encephalopathy. PMH: DM2, COPD, CKD3, GERD, bipolar disorder type 1, previous covid 19 infection, polysubstance abuse including cocaine.   OT comments  Pt making gradual progress towards OT goals this session. Pt received finishing breakfast in bed with pt agreeable to OT intervention. Pt AxOx1 but pt was able  to recall correct year at end of session after reorienting pt at beginning of session. Pt continue to present with impaired awareness as pt with near LOB x2 with no awareness. Pt requires MIN A for functional mobility with RW needing step by step cues for appropriate stride length and BOS. Pt initially standing at sink to wash face but deferred standing ADLs to sitting d/t near LOB. Pt would continue to benefit from skilled occupational therapy while admitted and after d/c to address the below listed limitations in order to improve overall functional mobility and facilitate independence with BADL participation. Noted pt likely not candidate for SNF, but feel pt would needed MAX continued OT to maximize pts functional independence in order to get pt a level that her family is comfortable with. DC plan remains appropriate, will follow acutely per POC.     Follow Up Recommendations  Supervision/Assistance - 24 hour;Home health OT    Equipment Recommendations  3 in 1 bedside commode;Other (comment) (RW)    Recommendations for Other Services      Precautions / Restrictions Precautions Precautions: Fall Restrictions Weight Bearing Restrictions: No       Mobility Bed Mobility Overal bed mobility: Needs Assistance Bed Mobility: Supine to  Sit     Supine to sit: Mod assist     General bed mobility comments: pt required step by step cues to sequence steps of bed mobility, most assist needed to elevate trunk into sitting    Transfers Overall transfer level: Needs assistance Equipment used: Rolling walker (2 wheeled) Transfers: Sit to/from Stand Sit to Stand: Min assist         General transfer comment: pt unable to stand without assist, MIN A for rise from EOB with cues for hand placement    Balance Overall balance assessment: Needs assistance;History of Falls Sitting-balance support: Feet supported;Bilateral upper extremity supported Sitting balance-Leahy Scale: Poor Sitting balance - Comments: UE support and min guard from EOB   Standing balance support: During functional activity;Bilateral upper extremity supported Standing balance-Leahy Scale: Poor Standing balance comment: heavy lean on sink during ADLs                           ADL either performed or assessed with clinical judgement   ADL Overall ADL's : Needs assistance/impaired     Grooming: Wash/dry face;Standing;Sitting Grooming Details (indicate cue type and reason): initially standign at sink but needed to sit d/t near LOB         Upper Body Dressing : Minimal assistance;Sitting Upper Body Dressing Details (indicate cue type and reason): to don posterior gown     Toilet Transfer: Minimal assistance;RW;Ambulation Toilet Transfer Details (indicate cue type and reason): simulated via functional mobility, MIN A for balance and safety and step by step multimodal cues to sequence functional steps  Functional mobility during ADLs: Minimal assistance;Rolling walker;Cueing for safety;Cueing for sequencing General ADL Comments: Continues to be limited by decreased cognition including decreased anticipatory awareness, decreased balance and generalized weakness     Vision       Perception     Praxis      Cognition  Arousal/Alertness: Awake/alert Behavior During Therapy: WFL for tasks assessed/performed Overall Cognitive Status: No family/caregiver present to determine baseline cognitive functioning Area of Impairment: Orientation;Attention;Following commands;Safety/judgement;Awareness;Problem solving                 Orientation Level: Disoriented to;Time (unable to state date, year, or day of week) Current Attention Level: Sustained   Following Commands: Follows multi-step commands inconsistently;Follows one step commands inconsistently;Follows one step commands with increased time Safety/Judgement: Decreased awareness of safety;Decreased awareness of deficits Awareness: Intellectual Problem Solving: Slow processing;Difficulty sequencing;Requires verbal cues;Decreased initiation;Requires tactile cues General Comments: pt was able to recall correct year at end of session after education at beginning of session, biggest deficit continues to be lack of awareness as pt with x2 near LOB with no awareness        Exercises     Shoulder Instructions       General Comments      Pertinent Vitals/ Pain       Pain Assessment: Faces Faces Pain Scale: Hurts a little bit Pain Location: teeth Pain Descriptors / Indicators: Sore Pain Intervention(s): Monitored during session  Home Living                                          Prior Functioning/Environment              Frequency  Min 2X/week        Progress Toward Goals  OT Goals(current goals can now be found in the care plan section)  Progress towards OT goals: Progressing toward goals  Acute Rehab OT Goals Patient Stated Goal: to get to chair OT Goal Formulation: With patient Time For Goal Achievement: 02/07/21 Potential to Achieve Goals: Good  Plan Discharge plan remains appropriate;Frequency remains appropriate    Co-evaluation                 AM-PAC OT "6 Clicks" Daily Activity     Outcome  Measure   Help from another person eating meals?: None Help from another person taking care of personal grooming?: A Little Help from another person toileting, which includes using toliet, bedpan, or urinal?: A Lot Help from another person bathing (including washing, rinsing, drying)?: A Lot Help from another person to put on and taking off regular upper body clothing?: A Little Help from another person to put on and taking off regular lower body clothing?: A Little 6 Click Score: 17    End of Session Equipment Utilized During Treatment: Gait belt;Rolling walker  OT Visit Diagnosis: Unsteadiness on feet (R26.81);Other abnormalities of gait and mobility (R26.89);Muscle weakness (generalized) (M62.81)   Activity Tolerance Patient tolerated treatment well   Patient Left in chair;with call bell/phone within reach;with chair alarm set   Nurse Communication Mobility status        Time: 4259-5638 OT Time Calculation (min): 24 min  Charges: OT General Charges $OT Visit: 1 Visit OT Treatments $Self Care/Home Management : 23-37 mins  Lenor Derrick., COTA/L Acute Rehabilitation Services [phone]    Barron Schmid 01/28/2021, 1:06 PM

## 2021-01-28 NOTE — TOC Progression Note (Signed)
Transition of Care [facility]) - Progression Note    Patient Details  Name: Carolyn Norton MRN: [id number] Date of Birth: 16-Nov-1965  Transition of Care [facility]) CM/SW Contact  Janae Bridgeman, RN Phone Number: 01/28/2021, 12:53 PM  Clinical Narrative:    Case management and MSW will continue to follow for transitions of care needed and discharge planning.  The patient will be discharging home with family once she is able to ambulate with less assistance at this time.  Edwin Dada, MSW spoke with the patient's mother yesterday and the patient's mother was agreeable to take her home, once patient is able to safely mobilize to the bathroom and from bed to chair safely.  The patient has history of polysubstance abuse and would most likely not receive bed offers for SNF placement.  PT/OT will continue to work with the patient for strengthening exercises for home.    Expected Discharge Plan: Home/Self Care Barriers to Discharge: Family Issues,Continued Medical Work up  Expected Discharge Plan and Services Expected Discharge Plan: Home/Self Care In-house Referral: Clinical Social Work Discharge Planning Services: CM Consult   Living arrangements for the past 2 months: Single Family Home Expected Discharge Date: 01/16/21               DME Arranged: 3-N-1,Walker rolling DME Agency: [employer] Date DME Agency Contacted: 01/20/21 Time DME Agency Contacted: 1435 Representative spoke with at DME Agency: [name]             Social Determinants of Health (SDOH) Interventions    Readmission Risk Interventions Readmission Risk Prevention Plan 01/14/2021  Transportation Screening Complete  PCP or Specialist Appt within 3-5 Days (No Data)  Social Work Consult for Recovery Care Planning/Counseling Complete  Palliative Care Screening Not Applicable  Medication Review [employer]) Referral to Pharmacy  Some recent data might be hidden

## 2021-01-28 NOTE — Progress Notes (Signed)
[facility] PROGRESS NOTE  Datha Kissinger BTD:[id number] DOB: [birth year] DOA: 01/11/2021 PCP: [name], FNP  Status: Remains inpatient appropriate because:Unsafe d/c plan  Dispo:  Patient From:  Home  Planned Disposition:  Home-family has agreed to take patient back when she is medically stable and able to mobilize independently with minimal assistance.  Medically stable for discharge:    Barriers to DC: Continues to require significant assistance with mobility.             Difficult to place: Yes  Level of care: Med-Surg  Code Status: Full Family Communication:  DVT prophylaxis: Lovenox COVID vaccination status: Unknown   HPI: 55 year old female past medical history of diabetes mellitus type 2, bipolar disorder type I, previous COVID infection, polysubstance abuse that includes cocaine.  She presented from home where she lives with her parents.  Her presenting symptoms included generalized weakness, multiple falls and confusion.  Family confirmed that she had been having the symptoms for at least a month with last use of crack cocaine 1 month prior to presentation.  Ever since that time she had not been doing well.  She would also be having issues with hyperglycemia.  In the ER she was diagnosed with severe dehydration, hyperglycemia and metabolic encephalopathy.  Since admission patient has been evaluated by the psychiatric team who has deemed her alert and oriented and having capacity to make decisions.  Patient is agreeable to placement at a rehab.  Currently she has severe physical deconditioning which was POA and PT is recommending SNF.  Because of her history of polysubstance abuse and lack of funding SNF is not an option and current plan is to provide aggressive physical rehabilitation in hopes patient will be physically ready to discharge to a drug rehabilitation center.  Of note patient's family has refused to accept her back into the home.  Subjective: Awake.   Sitting up in chair.  Pleasant.  States she was able to feed herself today.  Admits to having significant difficulty with gait and balance  Objective: Vitals:   01/28/21 0509 01/28/21 0700  BP: 127/84 (!) 135/94  Pulse: 61 (!) 109  Resp: 18   Temp: 98.2 F (36.8 C) 97.6 F (36.4 C)  SpO2: 98% 96%    Intake/Output Summary (Last 24 hours) at 01/28/2021 0743 Last data filed at 01/27/2021 1062 Gross per 24 hour  Intake 240 ml  Output --  Net 240 ml   Filed Weights   01/11/21 1152  Weight: 99.8 kg    Exam:  Constitutional: Alert, calm, comfortable, no acute distress Respiratory:'s are clear to auscultation, she remained stable on room air Cardiovascular: Normal heart sounds, regular pulse, no peripheral edema. Abdomen: LBM 5/23, soft nontender nondistended with normoactive bowel-eating 75% of meals Neurologic: CN 2-12 grossly intact. Sensation intact, DTR normal. Strength 4/5 x all 4 extremities.  Continues to have significant ambulatory dysfunction and gait disturbance secondary to severe physical deconditioning Psychiatric: Alert and able to answer orientation questions appropriately but continues to demonstrate issues regarding short-term memory, remains pleasant.   Assessment/Plan: Acute problems: Acute metabolic encephalopathy (multifactorial): Severe dehydration in context of poorly controlled diabetes mellitus with hyperglycemia Likely cocaine withdrawal and poor nutrition POA contributed Acute encephalopathy now resolved  Diabetes mellitus 2 with hyperglycemia on insulin Hemoglobin A1c 11.0 Initiated metformin this admission-CBGs have been between 164 and 334-can likely increase dose in a few more days-and this slowly will decrease the chance of GI side effects Continue SSI  Dyslipidemia Continue statin  History of bipolar 1 disorder/polysubstance abuse/cognitive impairment Continue escitalopram, Neurontin and Depakote-LFTs normal Cleared by psych regarding  orientation and capacity Functional cognitive evaluation pending per SLP and Occupational Therapy   Other problems: Acute kidney injury on CKD 3a Acute kidney injury resolved after IV fluids Baseline creatinine 1.3  COPD Continue Singulair, nebs as needed, consider adding LABA  GERD Continue PPI  Hypokalemia/hypomagnesemia Resolved  Data Reviewed: Basic Metabolic Panel: Recent Labs  Lab 01/23/21 1302 01/24/21 1701 01/27/21 0351  NA  --  133* 134*  K  --  4.0 4.2  CL  --  97* 98  CO2  --  30 29  GLUCOSE 534* 203* 330*  BUN  --  22* 16  CREATININE  --  1.26* 1.07*  CALCIUM  --  8.9 8.9   Liver Function Tests: No results for input(s): AST, ALT, ALKPHOS, BILITOT, PROT, ALBUMIN in the last 168 hours. No results for input(s): LIPASE, AMYLASE in the last 168 hours. Recent Labs  Lab 01/24/21 1701  AMMONIA 28   CBC: Recent Labs  Lab 01/24/21 1701  WBC 6.4  HGB 12.5  HCT 37.7  MCV 87.5  PLT 199   Cardiac Enzymes: No results for input(s): CKTOTAL, CKMB, CKMBINDEX, TROPONINI in the last 168 hours. BNP (last 3 results) Recent Labs    01/14/21 0238 01/15/21 0230 01/16/21 0201  BNP 177.8* 115.9* 40.9    ProBNP (last 3 results) No results for input(s): PROBNP in the last 8760 hours.  CBG: Recent Labs  Lab 01/26/21 2206 01/27/21 0751 01/27/21 1204 01/27/21 1551 01/27/21 1947  GLUCAP 298* 297* 334* 211* 164*    No results found for this or any previous visit (from the past 240 hour(s)).   Studies: No results found.  Scheduled Meds: . aspirin  81 mg Oral Daily  . atorvastatin  20 mg Oral Daily  . divalproex  500 mg Oral BID  . enoxaparin (LOVENOX) injection  40 mg Subcutaneous Daily  . escitalopram  20 mg Oral Daily  . gabapentin  300 mg Oral BID  . insulin aspart  0-9 Units Subcutaneous TID WC  . metFORMIN  500 mg Oral BID WC  . montelukast  10 mg Oral QHS  . pantoprazole  40 mg Oral Daily   Continuous Infusions:  Principal Problem:    Bipolar 1 disorder, depressed, full remission (HCC) Active Problems:   Bipolar 1 disorder (HCC)   Generalized weakness   Consultants:  Psychiatry  Procedures:  None  Antibiotics: Anti-infectives (From admission, onward)   None       Time spent: 35 minutes    [name] ANP  [facility] 7 am - 330 pm/M-F for direct patient care and secure chat Please refer to Amion for contact info 16  days

## 2021-01-29 ENCOUNTER — Inpatient Hospital Stay (HOSPITAL_COMMUNITY): Payer: Medicaid Other

## 2021-01-29 LAB — GLUCOSE, CAPILLARY
Glucose-Capillary: 359 mg/dL — ABNORMAL HIGH (ref 70–99)
Glucose-Capillary: 311 mg/dL — ABNORMAL HIGH (ref 70–99)
Glucose-Capillary: 227 mg/dL — ABNORMAL HIGH (ref 70–99)
Glucose-Capillary: 241 mg/dL — ABNORMAL HIGH (ref 70–99)

## 2021-01-29 NOTE — Plan of Care (Signed)

## 2021-01-29 NOTE — Evaluation (Signed)
Speech Language Pathology Evaluation Patient Details Name: Carolyn Norton MRN: [id number] DOB: [birth year] Today's Date: 01/29/2021 Time:  -     Problem List:  Patient Active Problem List   Diagnosis Date Noted  . Bipolar 1 disorder, depressed, full remission (HCC) 01/18/2021  . Generalized weakness 01/11/2021  . Acute metabolic encephalopathy 10/22/2020  . Hypotension 10/22/2020  . Leukocytosis 10/22/2020  . Type 2 diabetes mellitus (HCC) 10/22/2020  . Altered mental status   . Hyperosmolar hyperglycemic state (HHS) (HCC)   . Hyperglycemia   . Hyperkalemia   . AKI (acute kidney injury) (HCC)   . Essential hypertension   . DKA (diabetic ketoacidosis) (HCC) 09/06/2020  . COVID-19 09/06/2020  . Bipolar 1 disorder (HCC) 09/06/2020  . Breast mass status post excision 09/06/2020  . Smoker 09/06/2020  . Metabolic acidosis due to ingestion of drugs or chemicals 09/06/2020   Past Medical History:  Past Medical History:  Diagnosis Date  . Anxiety   . Asthma   . COVID    History of  . Depression   . Diabetes mellitus without complication (HCC)   . Hyperlipidemia   . Hypertension   . Seasonal allergies    Past Surgical History:  Past Surgical History:  Procedure Laterality Date  . BREAST EXCISIONAL BIOPSY Right   . BREAST SURGERY     right breast   HPI:  Pt is a 55 y.o. female with medical history significant for DM2, GERD, COPD, bipolar disorder type 1, previous COVID-19 infection, polysubstance abuse including cocaine. pt presented from home due to generalized weakness, multiple falls and confusion. CXR 5/7 was negative for active disease. CT head 5/7 and 5/25 were negative for acute changes. Dx severe dehydration, hyperglycemia, and acute metabolic encephalopathy. BSE on 10/24/20 with f/u on 10/15/20 and recommendation for regular texture solids and thin liquids. BSE 5/8 WNL and further skilled SLP services were not clinically indicated for swallowing. SLP consulted with  request for completion of the SLUMS due to impairements in short term memory and concern for permanent cognitive impairement. Pt's RN has reported changes in pt's attention and possible changes in strength; CT ordered, but not yet completed at the time of the evaluation.   Assessment / Plan / Recommendation Clinical Impression  Pt participated in speech/language/cognition evaluation. Pt reported that, at baseline, she lives with her parents who manage her medications and finances. Pt reported chronic deficits in memory. These reports were corroborated by her mother who was contacted via phone, but her mother reported that the pt's cognition has progressively worsened during the admission. The [facility] Mental Status Examination was completed to evaluate the pt's cognitive-linguistic skills. She achieved a score of 2/30 which is below the normal limits of 27 or more out of 30. Formal and informal assessment revealed impairments in memory, attention, awareness, memory, orientation, problem solving, and executive function. Pt required additional processing and multiple repetitions to respond to questions and complete tasks. However, these strategies were only occasionally effective. Pt's speech and language skills were functional, but negatively impacted by her significant impairments in cognition. Pt's CT head today was negative for acute changes. It is recommended that pt receive SLP services at the next venue of care if her symptoms do not return to baseline after treatment of acute illness. Further acute skilled SLP services are not clinically indicated at this time. Pt, and her RN were educated regarding results and recommendations; both parties verbalized understanding as well as agreement with plan of care.  SLP Assessment  SLP Recommendation/Assessment: All further Speech Lanaguage Pathology  needs can be addressed in the next venue of care SLP Visit Diagnosis: Cognitive communication  deficit (R41.841)    Follow Up Recommendations       Frequency and Duration           SLP Evaluation Cognition  Overall Cognitive Status: Impaired/Different from baseline Arousal/Alertness: Awake/alert Attention: Focused;Sustained Focused Attention: Impaired Focused Attention Impairment: Verbal complex Sustained Attention: Impaired Sustained Attention Impairment: Verbal complex Memory: Impaired Memory Impairment: Retrieval deficit;Storage deficit;Decreased recall of new information (Immediate: 2/5; delayed: 0/5) Awareness: Impaired Awareness Impairment: Emergent impairment Problem Solving: Impaired Problem Solving Impairment: Verbal complex Executive Function: Sequencing;Organizing;Reasoning Reasoning: Impaired Reasoning Impairment: Verbal complex;Verbal basic Sequencing: Impaired Organizing: Impaired       Comprehension  Auditory Comprehension Yes/No Questions: Impaired Basic Immediate Environment Questions:  (4/5) Complex Questions:  (3/5) Commands: Impaired One Step Basic Commands:  (4/4) Two Step Basic Commands:  (1/4) Conversation: Simple Interfering Components: Processing speed;Working [employer] fast streamer;Attention    Expression Expression Primary Mode of Expression: Verbal Verbal Expression Initiation: Impaired Level of Generative/Spontaneous Verbalization: Phrase Pragmatics: Impairment Impairments: Eye contact Interfering Components: Attention   Oral / Motor  Oral Motor/Sensory Function Overall Oral Motor/Sensory Function: Within functional limits Motor Speech Overall Motor Speech: Appears within functional limits for tasks assessed Respiration: Within functional limits Phonation: Low vocal intensity Resonance: Within functional limits Articulation: Within functional limitis Motor Planning: Witnin functional limits   Sincerity Cedar I. Vear Clock, MS, CCC-SLP Acute Rehabilitation Services Office number [phone] Pager [phone]                    Scheryl Marten 01/29/2021, 4:23 PM

## 2021-01-29 NOTE — TOC Progression Note (Signed)
Transition of Care [facility]) - Progression Note    Patient Details  Name: Carolyn Norton MRN: [id number] Date of Birth: 10/18/1965  [facility]) CM/SW Contact  Janae Bridgeman, RN Phone Number: 01/29/2021, 11:34 AM  Clinical Narrative:    Case management spoke with financial counseling regarding follow up and reconsideration for disability / Medicaid since patient's evaluation by OT yesterday - displaying significant cognitive impairment during Short Blessed Test.  Attending physician, Dr. Jarvis Newcomer ordered CT head schedule for today.  CM and MSW will continue to follow for discharging planning / transitions of care needs.  Expected Discharge Plan: Home/Self Care Barriers to Discharge: Family Issues,Continued Medical Work up  Expected Discharge Plan and Services Expected Discharge Plan: Home/Self Care In-house Referral: Clinical Social Work Discharge Planning Services: CM Consult   Living arrangements for the past 2 months: Single Family Home Expected Discharge Date: 01/16/21               DME Arranged: 3-N-1,Walker rolling DME Agency: [employer] Date DME Agency Contacted: 01/20/21 Time DME Agency Contacted: [id number] Representative spoke with at DME Agency: [name]             Social Determinants of Health (SDOH) Interventions    Readmission Risk Interventions Readmission Risk Prevention Plan 01/14/2021  Transportation Screening Complete  PCP or Specialist Appt within 3-5 Days (No Data)  Social Work Consult for Recovery Care Planning/Counseling Complete  Palliative Care Screening Not Applicable  Medication Review [employer]) Referral to Pharmacy  Some recent data might be hidden

## 2021-01-29 NOTE — Progress Notes (Signed)
During morning assessment, I noticed pt. had a little more difficulty swallowing one of her pills that she normally takes. Yesterday and the day before patient swallowed pills normal and today I noticed she had more difficulty swallowing them. Patient just kept them on her tongue and had to cued to swallow several times to get them down. Also, a grip neuro assessment was performed and initially the right hand grip was weak until patient was cued 3 times to grip (as strong as you can)  then the grips were equally strong. I notified MD and NP yesterday that pt. was slumping/leaning more towards the right side way more than usual. Also, during meals patient holds the utensils in her hand but doesn't reach them to her mouth unless she is cued several times. MD R. Grunz notified of assessments and concerns. See new orders.

## 2021-01-29 NOTE — Progress Notes (Signed)
[facility] PROGRESS NOTE  Carolyn Norton POE:[id number] DOB: 1965-09-30 DOA: 01/11/2021 PCP: [name], FNP  Status: Remains inpatient appropriate because:Unsafe d/c plan  Dispo:  Patient From:  Home  Planned Disposition: To be determinedHome-family has agreed to take patient back when she is medically stable and able to mobilize independently with minimal assistance.  Medically stable for discharge:    Barriers to DC: Continues to require significant assistance with mobility.             Difficult to place: Yes  Level of care: Med-Surg  Code Status: Full Family Communication:  DVT prophylaxis: Lovenox COVID vaccination status: Unknown   HPI: 54 year old female past medical history of diabetes mellitus type 2, bipolar disorder type I, previous COVID infection, polysubstance abuse that includes cocaine.  She presented from home where she lives with her parents.  Her presenting symptoms included generalized weakness, multiple falls and confusion.  Family confirmed that she had been having the symptoms for at least a month with last use of crack cocaine 1 month prior to presentation.  Ever since that time she had not been doing well.  She would also be having issues with hyperglycemia.  In the ER she was diagnosed with severe dehydration, hyperglycemia and metabolic encephalopathy.  Since admission patient has been evaluated by the psychiatric team who has deemed her alert and oriented and having capacity to make decisions.  Patient is agreeable to placement at a rehab.  Currently she has severe physical deconditioning which was POA and PT is recommending SNF.  Because of her history of polysubstance abuse and lack of funding SNF is not an option and current plan is to provide aggressive physical rehabilitation in hopes patient will be physically ready to discharge to a drug rehabilitation center.  Of note patient's family has refused to accept her back into the  home.  Subjective: Pt has no complaints, but has been slumping and requiring increased encouragement/cuing for ADLs of late. Denies numbness or focal weakness, though she's aware she's very weak throughout. No vision changes, hearing changes, palpitations or dyspnea.  Objective: Vitals:   01/29/21 0510 01/29/21 1336  BP: 116/82 110/62  Pulse: 89 90  Resp: 18 18  Temp: 98.1 F (36.7 C) 97.9 F (36.6 C)  SpO2: 96% 99%    Intake/Output Summary (Last 24 hours) at 01/29/2021 1914 Last data filed at 01/29/2021 1321 Gross per 24 hour  Intake 960 ml  Output 550 ml  Net 410 ml   Filed Weights   01/11/21 1152  Weight: 99.8 kg    Exam: Gen: 55 y.o. female in no distress Pulm: Nonlabored breathing room air. Clear. CV: Regular rate and rhythm. No murmur, rub, or gallop. No JVD, no dependent edema. GI: Abdomen soft, non-tender, non-distended, with normoactive bowel sounds.  Ext: Warm, no deformities Skin: No rashes, lesions or ulcers on visualized skin. Neuro: Alert with impaired memory/recall, intermittent cooperative with exam. Does demonstrate 4/5 strength in all extremities but requires max cuing. CNs intact Psych: Judgement and insight appear impaired.   Assessment/Plan: Acute problems: Acute metabolic encephalopathy (multifactorial): Severe dehydration in context of poorly controlled diabetes mellitus with hyperglycemia Likely cocaine withdrawal and poor nutrition POA contributed Acute encephalopathy now resolved  Diabetes mellitus 2 with hyperglycemia on insulin Hemoglobin A1c 11.0 Initiated metformin this admission-CBGs have been between 164 and 334-can likely increase dose in a few more days-and this slowly will decrease the chance of GI side effects Continue SSI  Dyslipidemia Continue statin  History  of bipolar 1 disorder/polysubstance abuse/cognitive impairment Continue escitalopram, Neurontin and Depakote-LFTs normal Cleared by psych regarding orientation and  capacity Functional cognitive evaluation pending per SLP and Occupational Therapy  Reported dysphagia:  - SLP evaluation - CT head ordered to r/o stroke/hemorrhage as exam was unreliable due to patient's inconsistent cooperation. Personally reviewed showing stable ventricular prominence without acute infarct or hemorrhage.   Other problems: Acute kidney injury on CKD 3a Acute kidney injury resolved after IV fluids Baseline creatinine 1.3  COPD Continue Singulair, nebs as needed, consider adding LABA  GERD Continue PPI  Hypokalemia/hypomagnesemia Resolved  Data Reviewed: Basic Metabolic Panel: Recent Labs  Lab 01/23/21 1302 01/24/21 1701 01/27/21 0351  NA  --  133* 134*  K  --  4.0 4.2  CL  --  97* 98  CO2  --  30 29  GLUCOSE 534* 203* 330*  BUN  --  22* 16  CREATININE  --  1.26* 1.07*  CALCIUM  --  8.9 8.9   Liver Function Tests: No results for input(s): AST, ALT, ALKPHOS, BILITOT, PROT, ALBUMIN in the last 168 hours. No results for input(s): LIPASE, AMYLASE in the last 168 hours. Recent Labs  Lab 01/24/21 1701  AMMONIA 28   CBC: Recent Labs  Lab 01/24/21 1701  WBC 6.4  HGB 12.5  HCT 37.7  MCV 87.5  PLT 199   Cardiac Enzymes: No results for input(s): CKTOTAL, CKMB, CKMBINDEX, TROPONINI in the last 168 hours. BNP (last 3 results) Recent Labs    01/14/21 0238 01/15/21 0230 01/16/21 0201  BNP 177.8* 115.9* 40.9    ProBNP (last 3 results) No results for input(s): PROBNP in the last 8760 hours.  CBG: Recent Labs  Lab 01/28/21 1623 01/28/21 2102 01/29/21 0748 01/29/21 1221 01/29/21 1558  GLUCAP 131* 228* 359* 311* 227*    No results found for this or any previous visit (from the past 240 hour(s)).   Studies: CT HEAD WO CONTRAST  Result Date: 01/29/2021 CLINICAL DATA:  Mental status change, unknown cause. EXAM: CT HEAD WITHOUT CONTRAST TECHNIQUE: Contiguous axial images were obtained from the base of the skull through the vertex without  intravenous contrast. COMPARISON:  Prior head CT examinations 01/11/2021 and earlier. FINDINGS: Brain: Stable cerebral and cerebellar atrophy. Lateral and third ventriculomegaly, which may be secondary to central predominant atrophy or may reflect communicating/normal pressure hydrocephalus in the appropriate clinical setting. Findings are unchanged as compared to the prior head CT of 01/11/2021. Unchanged ill-defined hypodensity within the cerebral white matter, nonspecific but compatible with chronic small vessel ischemic disease. There is no acute intracranial hemorrhage. No demarcated cortical infarct. No extra-axial fluid collection. No evidence of intracranial mass. No midline shift. Vascular: No hyperdense vessel.  Atherosclerotic calcifications. Skull: Normal. Negative for fracture or focal lesion. Sinuses/Orbits: Visualized orbits show no acute finding. No significant paranasal sinus disease. IMPRESSION: No evidence of acute intracranial hemorrhage or acute infarction. Unchanged prominence of the lateral and third ventricles as compared to the head CT of 01/11/2021. This may be secondary to central predominant atrophy, or communicating/normal pressure hydrocephalus in the appropriate clinical setting. Stable generalized parenchymal atrophy and cerebral white matter chronic small vessel ischemic disease. Electronically Signed   By: Jackey Loge DO   On: 01/29/2021 13:12    Scheduled Meds: . aspirin  81 mg Oral Daily  . atorvastatin  20 mg Oral Daily  . divalproex  500 mg Oral BID  . enoxaparin (LOVENOX) injection  40 mg Subcutaneous Daily  . escitalopram  20 mg Oral Daily  . gabapentin  300 mg Oral BID  . insulin aspart  0-9 Units Subcutaneous TID WC  . metFORMIN  500 mg Oral BID WC  . montelukast  10 mg Oral QHS  . pantoprazole  40 mg Oral Daily   Continuous Infusions:  Principal Problem:   Bipolar 1 disorder, depressed, full remission (HCC) Active Problems:   Bipolar 1 disorder (HCC)    Generalized weakness   Consultants:  Psychiatry  Procedures:  None  Antibiotics: Anti-infectives (From admission, onward)   None       Time spent: 35 minutes    Tyrone Nine, MD  [facility] Please refer to [facility] for contact info 17  days

## 2021-01-30 LAB — GLUCOSE, CAPILLARY
Glucose-Capillary: 472 mg/dL — ABNORMAL HIGH (ref 70–99)
Glucose-Capillary: 412 mg/dL — ABNORMAL HIGH (ref 70–99)
Glucose-Capillary: 241 mg/dL — ABNORMAL HIGH (ref 70–99)
Glucose-Capillary: 218 mg/dL — ABNORMAL HIGH (ref 70–99)

## 2021-01-30 MED ORDER — GLUCERNA SHAKE PO LIQD
237.0000 mL | Freq: Three times a day (TID) | ORAL | Status: DC
  Administered 2021-01-30 – 2021-03-04 (×108): 237 mL via ORAL
  Filled 2021-01-30 (×14): qty 237

## 2021-01-30 MED ORDER — INSULIN ASPART 100 UNIT/ML IJ SOLN
12.0000 [IU] | Freq: Once | INTRAMUSCULAR | Status: AC
  Administered 2021-01-30: 12 [IU] via SUBCUTANEOUS

## 2021-01-30 MED ORDER — METFORMIN HCL 850 MG PO TABS
850.0000 mg | ORAL_TABLET | Freq: Two times a day (BID) | ORAL | Status: DC
  Administered 2021-01-30 – 2021-02-03 (×8): 850 mg via ORAL
  Filled 2021-01-30 (×9): qty 1

## 2021-01-30 NOTE — Progress Notes (Addendum)
Physical Therapy Treatment Patient Details Name: Carolyn Norton MRN: [id number] DOB: 09/03/1966 Today's Date: 01/30/2021    History of Present Illness Pt presents from home on 01/13/21 with multiple falls, confusion, and weakness. In ED blood glucose was 729 and pt was unable to ambulate.  Patient admitted with severe dehydration and metabolic encephalopathy. PMH: DM2, COPD, CKD3, GERD, bipolar disorder type 1, previous covid 19 infection, polysubstance abuse including cocaine.    PT Comments    Pt seen to assist with obtaining orthostatic vitals and exertional SpO2. Pt was negative for orthostasis and does not require supplemental O2. Continues to have poor mobility.  SpO2 100% on RA at rest and 98% on RA with exertion.  Orthostatic BPs  Sitting 116/70 HR - 84  Standing 137/100 (? Accuracy) HR - 86  Standing after 3 min 133/80 HR - 105     Follow Up Recommendations  SNF     Equipment Recommendations  Rolling walker with 5" wheels;3in1 (PT)    Recommendations for Other Services       Precautions / Restrictions Precautions Precautions: Fall    Mobility  Bed Mobility Overal bed mobility: Needs Assistance           General bed mobility comments: Pt up in chair    Transfers Overall transfer level: Needs assistance Equipment used: Rolling walker (2 wheeled) Transfers: Sit to/from [employer] Sit to Stand: Max assist         General transfer comment: Assist to bring hips up and to correct posterior lean. Had pt place hands on walker to encourage forward weight shift.  Ambulation/Gait       General Gait Details: Unable to amb due to posteior lean and inability to initiate steps   Stairs             Wheelchair Mobility    Modified Rankin (Stroke Patients Only)       Balance Overall balance assessment: Needs assistance;History of Falls Sitting-balance support: Feet supported;Bilateral upper extremity supported Sitting  balance-Leahy Scale: Poor Sitting balance - Comments: UE support and min assist. Initial max assist to bring trunk forward from back of chair Postural control: Posterior lean Standing balance support: During functional activity;Bilateral upper extremity supported Standing balance-Leahy Scale: Poor Standing balance comment: walker and mod assist due to posterior bias                            Cognition Arousal/Alertness: Awake/alert Behavior During Therapy: WFL for tasks assessed/performed Overall Cognitive Status: Impaired/Different from baseline Area of Impairment: Orientation;Attention;Memory;Following commands;Safety/judgement;Awareness;Problem solving                 Orientation Level: Disoriented to;Time;Situation;Place Current Attention Level: Sustained Memory: Decreased short-term memory Following Commands: Follows one step commands with increased time;Follows multi-step commands inconsistently Safety/Judgement: Decreased awareness of safety;Decreased awareness of deficits Awareness: Intellectual Problem Solving: Slow processing;Difficulty sequencing;Requires verbal cues;Decreased initiation;Requires tactile cues        Exercises      General Comments        Pertinent Vitals/Pain Pain Assessment: No/denies pain    Home Living                      Prior Function            PT Goals (current goals can now be found in the care plan section) Acute Rehab PT Goals Patient Stated Goal: to go home Progress towards PT  goals: Not progressing toward goals - comment    Frequency    Min 2X/week      PT Plan Current plan remains appropriate    Co-evaluation              AM-PAC PT "6 Clicks" Mobility   Outcome Measure  Help needed turning from your back to your side while in a flat bed without using bedrails?: A Lot Help needed moving from lying on your back to sitting on the side of a flat bed without using bedrails?: A Lot Help  needed moving to and from a bed to a chair (including a wheelchair)?: A Lot Help needed standing up from a chair using your arms (e.g., wheelchair or bedside chair)?: A Lot Help needed to walk in hospital room?: A Lot Help needed climbing 3-5 steps with a railing? : Total 6 Click Score: 11    End of Session Equipment Utilized During Treatment: Gait belt Activity Tolerance: Patient tolerated treatment well Patient left: with call bell/phone within reach;in chair;with chair alarm set Nurse Communication: Mobility status;Need for lift equipment (Stedy likely more efficient.) PT Visit Diagnosis: Muscle weakness (generalized) (M62.81);Repeated falls (R29.6);Difficulty in walking, not elsewhere classified (R26.2);Unsteadiness on feet (R26.81)     Time: 7341-9379 PT Time Calculation (min) (ACUTE ONLY): 18 min  Charges:  $Gait Training: 8-22 mins $Therapeutic Activity: 8-22 mins                     [facility] PT Acute Rehabilitation Services Pager [phone] Office [phone]    Angelina Ok [facility] 01/30/2021, 3:35 PM

## 2021-01-30 NOTE — Plan of Care (Signed)
  Problem: Education: Goal: Knowledge of General Education information will improve Description: Including pain rating scale, medication(s)/side effects and non-pharmacologic comfort measures Outcome: Progressing   Problem: Health Behavior/Discharge Planning: Goal: Ability to manage health-related needs will improve Outcome: Progressing   Problem: Clinical Measurements: Goal: Ability to maintain clinical measurements within normal limits will improve Outcome: Progressing   Problem: Nutrition: Goal: Adequate nutrition will be maintained Outcome: Progressing   Problem: Elimination: Goal: Will not experience complications related to bowel motility Outcome: Progressing   

## 2021-01-30 NOTE — Progress Notes (Signed)
[facility] PROGRESS NOTE  Natallia Stellmach BJS:[id number] DOB: November 05, 1965 DOA: 01/11/2021 PCP: [name], FNP  Status: Remains inpatient appropriate because:Unsafe d/c plan  Dispo:  Patient From:  Home  Planned Disposition: To be determinedHome-family has agreed to take patient back when she is medically stable and able to mobilize independently with minimal assistance.  Medically stable for discharge:    Barriers to DC: Continues to require significant assistance with mobility.             Difficult to place: Yes  Level of care: Med-Surg  Code Status: Full Family Communication:  DVT prophylaxis: Lovenox COVID vaccination status: Unknown   HPI: 55 year old female past medical history of diabetes mellitus type 2, bipolar disorder type I, previous COVID infection, polysubstance abuse that includes cocaine.  She presented from home where she lives with her parents.  Her presenting symptoms included generalized weakness, multiple falls and confusion.  Family confirmed that she had been having the symptoms for at least a month with last use of crack cocaine 1 month prior to presentation.  Ever since that time she had not been doing well.  She would also be having issues with hyperglycemia.  In the ER she was diagnosed with severe dehydration, hyperglycemia and metabolic encephalopathy.  Since admission patient has been evaluated by the psychiatric team who has deemed her alert and oriented and having capacity to make decisions.  Patient is agreeable to placement at a rehab.  Currently she has severe physical deconditioning which was POA and PT is recommending SNF.  Because of her history of polysubstance abuse and lack of funding SNF is not an option and current plan is to provide aggressive physical rehabilitation in hopes patient will be physically ready to discharge to a drug rehabilitation center.  Of note patient's family has refused to accept her back into the  home.  Subjective: Alert.  Sitting up in bed.  States she is not eating because she just does not have an appetite.  When discussing with her she thought she might do better with sweet shakes so we will begin Glucerna shakes.  Objective: Vitals:   01/29/21 0510 01/29/21 1336  BP: 116/82 110/62  Pulse: 89 90  Resp: 18 18  Temp: 98.1 F (36.7 C) 97.9 F (36.6 C)  SpO2: 96% 99%    Intake/Output Summary (Last 24 hours) at 01/30/2021 6073 Last data filed at 01/29/2021 1321 Gross per 24 hour  Intake 960 ml  Output 500 ml  Net 460 ml   Filed Weights   01/11/21 1152  Weight: 99.8 kg    Exam:  Constitutional: Alert, pleasant, no acute distress Respiratory: Normal heart sounds, regular pulse, no peripheral edema. Abdomen: LBM 5/23, soft nontender nondistended.  Normoactive bowel sounds.  Eating anywhere from 25 to 75% of meals.  Has been encouraged regularly to eat.  Reports has no appetite. Neurologic: CN 2-12 grossly intact. Sensation intact, DTR normal. Strength 4/5 x all 4 extremities.  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  Financial counseling made aware we have reapplied for Medicaid.  We will also need to apply for disability as well.  Cerebellar atrophy/ambulatory dysfunction Continue PT/OT here Continue rolling walker for safety Patient impulsive due to short-term memory deficits  Anorexia Likely related to underlying neurocognitive issues Check urinalysis and culture to rule out possible UTI as etiology  Diabetes mellitus 2 with hyperglycemia on insulin Hemoglobin A1c 11.0 Initiated metformin this admission-dose increased on 5/24 May need to add second agent Patient's parents are in their 29s and patient unable to manage meds independently.  Would like to avoid long-acting insulin in the home setting Continue SSI  Dyslipidemia Continue statin  History of bipolar 1 disorder/polysubstance abuse/cognitive impairment Continue escitalopram, Neurontin and Depakote-LFTs normal Cleared by psych regarding orientation and capacity Functional cognitive evaluation pending per SLP and Occupational Therapy   Other problems: Acute kidney injury on CKD 3a Acute kidney injury resolved after IV fluids Baseline creatinine 1.3  COPD Continue Singulair, nebs as needed, consider adding LABA  GERD Continue PPI  Hypokalemia/hypomagnesemia Resolved  Data Reviewed: Basic Metabolic Panel: Recent Labs  Lab 01/23/21 1302 01/24/21 1701 01/27/21 0351  NA  --  133* 134*  K  --  4.0 4.2  CL  --  97* 98  CO2  --  30 29  GLUCOSE 534* 203* 330*  BUN  --  22* 16  CREATININE  --  1.26* 1.07*  CALCIUM  --  8.9 8.9   Liver Function Tests: No results for input(s): AST, ALT, ALKPHOS, BILITOT, PROT, ALBUMIN in the last 168 hours. No results for input(s): LIPASE, AMYLASE in the last 168 hours. Recent Labs  Lab 01/24/21 1701  AMMONIA 28   CBC: Recent Labs  Lab 01/24/21 1701  WBC 6.4  HGB 12.5  HCT 37.7  MCV 87.5  PLT 199   Cardiac Enzymes: No results for input(s): CKTOTAL, CKMB,  CKMBINDEX, TROPONINI in the last 168 hours. BNP (last 3 results) Recent Labs    01/14/21 0238 01/15/21 0230 01/16/21 0201  BNP 177.8* 115.9* 40.9    ProBNP (last 3 results) No results for input(s): PROBNP in the last 8760 hours.  CBG: Recent Labs  Lab 01/29/21 0748 01/29/21 1221 01/29/21 1558 01/29/21 2001 01/30/21 0818  GLUCAP 359* 311* 227* 241* 472*    No results found for this or any previous visit (from the past 240 hour(s)).   Studies: CT HEAD WO CONTRAST  Result Date: 01/29/2021 CLINICAL DATA:  Mental status change, unknown cause. EXAM: CT HEAD WITHOUT CONTRAST TECHNIQUE: Contiguous axial images were obtained from the base of the skull through the vertex without intravenous contrast. COMPARISON:  Prior head CT examinations 01/11/2021 and earlier. FINDINGS: Brain: Stable cerebral and cerebellar atrophy. Lateral and third ventriculomegaly, which may be secondary to central predominant atrophy or may reflect communicating/normal pressure hydrocephalus in the appropriate clinical setting. Findings are unchanged as compared to the prior head CT of 01/11/2021. Unchanged ill-defined hypodensity within the cerebral white matter, nonspecific but compatible with chronic small vessel ischemic disease. There is no acute intracranial hemorrhage. No demarcated cortical infarct. No extra-axial fluid collection. No evidence of intracranial mass. No midline shift. Vascular: No hyperdense vessel.  Atherosclerotic calcifications. Skull: Normal. Negative for fracture or focal lesion. Sinuses/Orbits: Visualized orbits show no acute finding. No significant paranasal sinus disease. IMPRESSION: No evidence of acute intracranial  hemorrhage or acute infarction. Unchanged prominence of the lateral and third ventricles as compared to the head CT of 01/11/2021. This may be secondary to central predominant atrophy, or communicating/normal pressure hydrocephalus in the appropriate clinical setting. Stable  generalized parenchymal atrophy and cerebral white matter chronic small vessel ischemic disease. Electronically Signed   By: Jackey Loge DO   On: 01/29/2021 13:12    Scheduled Meds: . aspirin  81 mg Oral Daily  . atorvastatin  20 mg Oral Daily  . divalproex  500 mg Oral BID  . enoxaparin (LOVENOX) injection  40 mg Subcutaneous Daily  . escitalopram  20 mg Oral Daily  . gabapentin  300 mg Oral BID  . insulin aspart  0-9 Units Subcutaneous TID WC  . metFORMIN  500 mg Oral BID WC  . montelukast  10 mg Oral QHS  . pantoprazole  40 mg Oral Daily   Continuous Infusions:  Principal Problem:   Bipolar 1 disorder, depressed, full remission (HCC) Active Problems:   Bipolar 1 disorder (HCC)   Generalized weakness   Consultants:  Psychiatry  Procedures:  None  Antibiotics: Anti-infectives (From admission, onward)   None       Time spent: 35 minutes    [name] ANP  [facility] 7 am - 330 pm/M-F for direct patient care and secure chat Please refer to Amion for contact info 18  days

## 2021-01-30 NOTE — Progress Notes (Signed)
Physical Therapy Treatment Patient Details Name: Carolyn Norton MRN: [id number] DOB: [birth year] Today's Date: 01/30/2021    History of Present Illness Pt presents from home on 01/13/21 with multiple falls, confusion, and weakness. In ED blood glucose was 729 and pt was unable to ambulate.  Patient admitted with severe dehydration and metabolic encephalopathy. PMH: DM2, COPD, CKD3, GERD, bipolar disorder type 1, previous covid 19 infection, polysubstance abuse including cocaine.    PT Comments    Pt continues with significant impairments in mobility, balance, and cognition. Continue to recommend SNF.    Follow Up Recommendations  SNF     Equipment Recommendations  Rolling walker with 5" wheels;3in1 (PT)    Recommendations for Other Services       Precautions / Restrictions Precautions Precautions: Fall    Mobility  Bed Mobility Overal bed mobility: Needs Assistance Bed Mobility: Supine to Sit     Supine to sit: HOB elevated;Max assist     General bed mobility comments: Assist to bring legs off of bed, elevate trunk into sitting and bring hips to EOB. Pt with no initiation to move trunk upright. Verbal/tactile cues for all steps of mobility.    Transfers Overall transfer level: Needs assistance Equipment used: Rolling walker (2 wheeled) Transfers: Sit to/from [employer] Sit to Stand: Mod assist         General transfer comment: Assist to bring hips up and to correct posterior lean. Had pt place hands on walker to encourage forward weight shift.  Ambulation/Gait Ambulation/Gait assistance: Min assist;Mod assist Gait Distance (Feet): 12 Feet Assistive device: Rolling walker (2 wheeled) Gait Pattern/deviations: Step-to pattern;Step-through pattern;Shuffle;Trunk flexed Gait velocity: extremely slow - At least 10 minutes to amb to the door Gait velocity interpretation: <1.31 ft/sec, indicative of household ambulator General Gait Details: Assist  for balance and support. Constant verbal/tactile/visual cues to incr step length and stay closer to walker. Counting steps 1-2-1-2 and assisting weight shift with some improvement.   Stairs             Wheelchair Mobility    Modified Rankin (Stroke Patients Only)       Balance Overall balance assessment: Needs assistance;History of Falls Sitting-balance support: Feet supported;Bilateral upper extremity supported Sitting balance-Leahy Scale: Poor Sitting balance - Comments: UE support and min guard to min assist Postural control: Posterior lean Standing balance support: During functional activity;Bilateral upper extremity supported Standing balance-Leahy Scale: Poor Standing balance comment: walker and mod assist initially due to posterior bias and min assist once wt shifted anteriorly                            Cognition Arousal/Alertness: Awake/alert Behavior During Therapy: WFL for tasks assessed/performed Overall Cognitive Status: Impaired/Different from baseline Area of Impairment: Orientation;Attention;Memory;Following commands;Safety/judgement;Awareness;Problem solving                 Orientation Level: Disoriented to;Time;Situation;Place Current Attention Level: Sustained Memory: Decreased short-term memory Following Commands: Follows one step commands with increased time;Follows multi-step commands inconsistently Safety/Judgement: Decreased awareness of safety;Decreased awareness of deficits Awareness: Intellectual Problem Solving: Slow processing;Difficulty sequencing;Requires verbal cues;Decreased initiation;Requires tactile cues        Exercises      General Comments        Pertinent Vitals/Pain Pain Assessment: No/denies pain    Home Living                      Prior Function  PT Goals (current goals can now be found in the care plan section) Acute Rehab PT Goals Patient Stated Goal: to go home Progress  towards PT goals: Not progressing toward goals - comment    Frequency    Min 2X/week      PT Plan Current plan remains appropriate    Co-evaluation              AM-PAC PT "6 Clicks" Mobility   Outcome Measure  Help needed turning from your back to your side while in a flat bed without using bedrails?: A Lot Help needed moving from lying on your back to sitting on the side of a flat bed without using bedrails?: A Lot Help needed moving to and from a bed to a chair (including a wheelchair)?: A Lot Help needed standing up from a chair using your arms (e.g., wheelchair or bedside chair)?: A Lot Help needed to walk in hospital room?: A Lot Help needed climbing 3-5 steps with a railing? : Total 6 Click Score: 11    End of Session Equipment Utilized During Treatment: Gait belt Activity Tolerance: Patient tolerated treatment well Patient left: with call bell/phone within reach;in chair;with chair alarm set Nurse Communication: Mobility status;Need for lift equipment (Stedy likely more efficient. Purewick out) PT Visit Diagnosis: Muscle weakness (generalized) (M62.81);Repeated falls (R29.6);Difficulty in walking, not elsewhere classified (R26.2);Unsteadiness on feet (R26.81)     Time: 5329-9242 PT Time Calculation (min) (ACUTE ONLY): 24 min  Charges:  $Gait Training: 8-22 mins $Therapeutic Activity: 8-22 mins                     [facility] PT Acute Rehabilitation Services Pager [phone] Office [phone]    Angelina Ok [facility] 01/30/2021, 3:30 PM

## 2021-01-31 ENCOUNTER — Inpatient Hospital Stay (HOSPITAL_COMMUNITY): Payer: Medicaid Other

## 2021-01-31 DIAGNOSIS — Z7189 Other specified counseling: Secondary | ICD-10-CM

## 2021-01-31 DIAGNOSIS — Z66 Do not resuscitate: Secondary | ICD-10-CM

## 2021-01-31 DIAGNOSIS — Z515 Encounter for palliative care: Secondary | ICD-10-CM

## 2021-01-31 LAB — URINALYSIS, ROUTINE W REFLEX MICROSCOPIC
Bacteria, UA: NONE SEEN
Bilirubin Urine: NEGATIVE
Glucose, UA: >=500 mg/dL — AB
Hgb urine dipstick: NEGATIVE
Ketones, ur: 5 mg/dL — AB
Leukocytes,Ua: NEGATIVE
Nitrite: NEGATIVE
Protein, ur: NEGATIVE mg/dL
Specific Gravity, Urine: 1.022 (ref 1.005–1.030)
pH: 5 (ref 5.0–8.0)

## 2021-01-31 LAB — GLUCOSE, CAPILLARY
Glucose-Capillary: 469 mg/dL — ABNORMAL HIGH (ref 70–99)
Glucose-Capillary: 260 mg/dL — ABNORMAL HIGH (ref 70–99)
Glucose-Capillary: 161 mg/dL — ABNORMAL HIGH (ref 70–99)
Glucose-Capillary: 63 mg/dL — ABNORMAL LOW (ref 70–99)
Glucose-Capillary: 112 mg/dL — ABNORMAL HIGH (ref 70–99)

## 2021-01-31 MED ORDER — INSULIN DETEMIR 100 UNIT/ML ~~LOC~~ SOLN
10.0000 [IU] | Freq: Every day | SUBCUTANEOUS | Status: DC
  Administered 2021-01-31: 10 [IU] via SUBCUTANEOUS
  Filled 2021-01-31 (×2): qty 0.1

## 2021-01-31 MED ORDER — INSULIN ASPART 100 UNIT/ML IJ SOLN: 0.0000 [IU] | Freq: Every day | INTRAMUSCULAR | Status: DC

## 2021-01-31 MED ORDER — SODIUM CHLORIDE 0.9 % IV SOLN: INTRAVENOUS | Status: DC

## 2021-01-31 MED ORDER — INSULIN ASPART 100 UNIT/ML IJ SOLN
0.0000 [IU] | Freq: Three times a day (TID) | INTRAMUSCULAR | Status: DC
  Administered 2021-01-31: 3 [IU] via SUBCUTANEOUS
  Administered 2021-02-01: 2 [IU] via SUBCUTANEOUS
  Administered 2021-02-01: 5 [IU] via SUBCUTANEOUS
  Administered 2021-02-01: 3 [IU] via SUBCUTANEOUS
  Administered 2021-02-02: 11 [IU] via SUBCUTANEOUS
  Administered 2021-02-02: 5 [IU] via SUBCUTANEOUS
  Administered 2021-02-03: 15 [IU] via SUBCUTANEOUS

## 2021-01-31 NOTE — Progress Notes (Signed)
[facility] PROGRESS NOTE  Carolyn Norton QHU:[id number] DOB: Jul 30, 1966 DOA: 01/11/2021 PCP: [name], FNP  Status: Remains inpatient appropriate because:Unsafe d/c plan  Dispo:  Patient From:  Home  Planned Disposition: To be determinedHome-family has agreed to take patient back when she is medically stable and able to mobilize independently with minimal assistance.  Medically stable for discharge:    Barriers to DC: Continues to require significant assistance with mobility.             Difficult to place: Yes  Level of care: Med-Surg  Code Status: Full Family Communication:  DVT prophylaxis: Lovenox COVID vaccination status: Unknown   HPI: 55 year old female past medical history of diabetes mellitus type 2, bipolar disorder type I, previous COVID infection, polysubstance abuse that includes cocaine.  She presented from home where she lives with her parents.  Her presenting symptoms included generalized weakness, multiple falls and confusion.  Family confirmed that she had been having the symptoms for at least a month with last use of crack cocaine 1 month prior to presentation.  Ever since that time she had not been doing well.  She would also be having issues with hyperglycemia.  In the ER she was diagnosed with severe dehydration, hyperglycemia and metabolic encephalopathy.  Since admission patient has been evaluated by the psychiatric team who has deemed her alert and oriented and having capacity to make decisions.  Patient is agreeable to placement at a rehab.  Currently she has severe physical deconditioning which was POA and PT is recommending SNF.  Because of her history of polysubstance abuse and lack of funding SNF is not an option and current plan is to provide aggressive physical rehabilitation in hopes patient will be physically ready to discharge to a drug rehabilitation center.  Of note patient's family has refused to accept her back into the  home.  Subjective: Awake.  Flat affect.  Still not eating.  Once again questioned her whether she was hungry or not and she stated no.  She stated she had also not drank any of her Glucerna's as well  Objective: Vitals:   01/30/21 2136 01/31/21 0701  BP: 139/71 125/65  Pulse: 82 73  Resp: 18 18  Temp: 98.5 F (36.9 C) 98.9 F (37.2 C)  SpO2: 93% 97%    Intake/Output Summary (Last 24 hours) at 01/31/2021 0818 Last data filed at 01/31/2021 4656 Gross per 24 hour  Intake --  Output 330 ml  Net -330 ml   Filed Weights   01/11/21 1152  Weight: 99.8 kg    Exam:  Constitutional: Awake,, flat affect, appears to be comfortable Respiratory: Sounds are clear, stable on room air Abdomen: LBM 5/23, soft nontender nondistended with normoactive bowel sounds Neurologic: CN 2-12 grossly intact. Sensation intact, DTR normal. Strength 4/5 x all 4 extremities.  Continues to have significant ambulatory dysfunction and gait disturbance secondary to severe physical deconditioning Psychiatric: Awake.  Oriented times name only.  Somewhat to place.  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  Financial counseling made aware we have  reapplied for Medicaid.  We will also need to apply for disability as well.  Cerebellar atrophy/ambulatory dysfunction Continue PT/OT here Continue rolling walker for safety Patient impulsive due to short-term memory deficits  Anorexia Likely related to underlying neurocognitive issues Check urinalysis and culture to rule out possible UTI as etiology Check KUB to rule out significant constipation contributing to poor oral intake Has not taken in fluids consistently since 5/25 therefore will initiate normal saline at 75 cc/h and repeat labs in a.m.  Diabetes mellitus 2 with hyperglycemia on insulin Hemoglobin A1c 11.0 Initiated metformin this admission-dose increased on 5/26 May need to add second agent Patient's parents are in their 38s and patient unable to manage meds independently.  Would like to avoid long-acting insulin in the home setting Continue SSI  Dyslipidemia Continue statin  History of bipolar 1 disorder/polysubstance abuse/cognitive impairment Continue escitalopram, Neurontin and Depakote-LFTs normal Cleared by psych regarding orientation and capacity Functional cognitive evaluation pending per SLP and Occupational Therapy   Other problems: Acute kidney injury on CKD 3a Acute kidney injury resolved after IV fluids Baseline creatinine 1.3  COPD Continue Singulair, nebs as needed, consider adding LABA  GERD Continue PPI  Hypokalemia/hypomagnesemia Resolved  Data Reviewed: Basic Metabolic Panel: Recent Labs  Lab 01/24/21 1701 01/27/21 0351  NA 133* 134*  K 4.0 4.2  CL 97* 98  CO2 30 29  GLUCOSE 203* 330*  BUN 22* 16  CREATININE 1.26* 1.07*  CALCIUM 8.9 8.9   Liver Function Tests: No results for input(s): AST, ALT, ALKPHOS, BILITOT, PROT, ALBUMIN in the last 168 hours. No results for input(s): LIPASE, AMYLASE in the last 168 hours. Recent Labs  Lab 01/24/21 1701  AMMONIA 28   CBC: Recent Labs  Lab 01/24/21 1701  WBC 6.4  HGB 12.5  HCT  37.7  MCV 87.5  PLT 199   Cardiac Enzymes: No results for input(s): CKTOTAL, CKMB, CKMBINDEX, TROPONINI in the last 168 hours. BNP (last 3 results) Recent Labs    01/14/21 0238 01/15/21 0230 01/16/21 0201  BNP 177.8* 115.9* 40.9    ProBNP (last 3 results) No results for input(s): PROBNP in the last 8760 hours.  CBG: Recent Labs  Lab 01/30/21 0818 01/30/21 1154 01/30/21 1557 01/30/21 2135 01/31/21 0734  GLUCAP 472* 412* 241* 218* 469*    No results found for this or any previous visit (from the past 240 hour(s)).   Studies: CT HEAD WO CONTRAST  Result Date: 01/29/2021 CLINICAL DATA:  Mental status change, unknown cause. EXAM: CT HEAD WITHOUT CONTRAST TECHNIQUE: Contiguous axial images were obtained from the base of the skull through the vertex without intravenous contrast. COMPARISON:  Prior head CT examinations 01/11/2021 and earlier. FINDINGS: Brain: Stable cerebral and cerebellar atrophy. Lateral and third ventriculomegaly, which may be secondary to central predominant atrophy or may reflect communicating/normal pressure hydrocephalus in the appropriate clinical setting. Findings are unchanged as compared to the prior head CT of 01/11/2021. Unchanged ill-defined hypodensity within the cerebral white matter, nonspecific but compatible with chronic small vessel ischemic disease. There is no acute intracranial hemorrhage. No demarcated cortical infarct. No extra-axial fluid collection. No evidence of intracranial mass. No midline shift. Vascular: No hyperdense vessel.  Atherosclerotic calcifications. Skull: Normal. Negative for fracture or focal lesion. Sinuses/Orbits: Visualized orbits show no acute finding. No significant paranasal sinus disease. IMPRESSION: No evidence of acute intracranial hemorrhage  or acute infarction. Unchanged prominence of the lateral and third ventricles as compared to the head CT of 01/11/2021. This may be secondary to central predominant atrophy, or  communicating/normal pressure hydrocephalus in the appropriate clinical setting. Stable generalized parenchymal atrophy and cerebral white matter chronic small vessel ischemic disease. Electronically Signed   By: Jackey Loge DO   On: 01/29/2021 13:12    Scheduled Meds: . aspirin  81 mg Oral Daily  . atorvastatin  20 mg Oral Daily  . divalproex  500 mg Oral BID  . enoxaparin (LOVENOX) injection  40 mg Subcutaneous Daily  . escitalopram  20 mg Oral Daily  . feeding supplement (GLUCERNA SHAKE)  237 mL Oral TID AC & HS  . gabapentin  300 mg Oral BID  . insulin aspart  0-9 Units Subcutaneous TID WC  . metFORMIN  850 mg Oral BID WC  . montelukast  10 mg Oral QHS  . pantoprazole  40 mg Oral Daily   Continuous Infusions:  Principal Problem:   Bipolar 1 disorder, depressed, full remission (HCC) Active Problems:   Bipolar 1 disorder (HCC)   Generalized weakness   Consultants:  Psychiatry  Procedures:  None  Antibiotics: Anti-infectives (From admission, onward)   None       Time spent: 35 minutes    [name] ANP  [facility] 7 am - 330 pm/M-F for direct patient care and secure chat Please refer to Amion for contact info 19  days

## 2021-01-31 NOTE — Progress Notes (Signed)
Occupational Therapy Treatment Patient Details Name: Carolyn Norton MRN: [id number] DOB: [birth year] Today's Date: 01/31/2021    History of present illness Pt presents from home on 01/13/21 with multiple falls, confusion, and weakness. In ED blood glucose was 729 and pt was unable to ambulate.  Patient admitted with severe dehydration and metabolic encephalopathy. PMH: DM2, COPD, CKD3, GERD, bipolar disorder type 1, previous covid 19 infection, polysubstance abuse including cocaine.   OT comments  Pt making gradual progress towards OT goals this session. Pt asleep in bed upon arrival but arouses easily and is agreeable to OT intervention. Pt continues to present with decreased ability to follow one step command even with max multimodal cues, and impaired awareness. Pt required total A for bed mobility this session and MAX A for static sitting balance EOB with BUE supported d/t heavy posterior lean and no righting reaction noted. Unsafe to transfer OOB this session. Pt would continue to benefit from skilled occupational therapy while admitted and after d/c to address the below listed limitations in order to improve overall functional mobility and facilitate independence with BADL participation. DC plan remains appropriate, will follow acutely per POC.     Follow Up Recommendations  SNF;Supervision/Assistance - 24 hour    Equipment Recommendations  3 in 1 bedside commode    Recommendations for Other Services      Precautions / Restrictions Precautions Precautions: Fall Restrictions Weight Bearing Restrictions: No       Mobility Bed Mobility Overal bed mobility: Needs Assistance Bed Mobility: Supine to Sit     Supine to sit: Total assist Sit to supine: Total assist   General bed mobility comments: pt making no efforts to assist with bed mobility despite MAX step by step multimodal cues    Transfers                 General transfer comment: unsafe to transfer this  session as pt needing MAX A for static sitting balance with heavy posterior lean    Balance Overall balance assessment: Needs assistance;History of Falls Sitting-balance support: Feet supported;Bilateral upper extremity supported Sitting balance-Leahy Scale: Poor Sitting balance - Comments: unable to static sit without MAX A Postural control: Posterior lean     Standing balance comment: NT                           ADL either performed or assessed with clinical judgement   ADL Overall ADL's : Needs assistance/impaired     Grooming: Wash/dry face;Bed level;Moderate assistance Grooming Details (indicate cue type and reason): MOD A for cleanliness and max multimodal cues to initiate task, pt also noted to go through motions of washing face but not actually making contact with her face                   Toilet Transfer Details (indicate cue type and reason): unsafe to transfer this session         Functional mobility during ADLs: Total assistance;Maximal assistance (bed mobility and sitting balance only) General ADL Comments: pt awareness and command following appears to be worse today with pt following ~ 25 % of commands needing max mulitomdal cues for ADLs and mobility tasks     Vision       Perception     Praxis      Cognition Arousal/Alertness: Awake/alert Behavior During Therapy: WFL for tasks assessed/performed Overall Cognitive Status: Impaired/Different from baseline Area of Impairment: Orientation;Attention;Memory;Following commands;Safety/judgement;Awareness;Problem solving  Orientation Level: Disoriented to;Place;Time;Situation (did state she was in "therapy" at end of session) Current Attention Level: Sustained Memory: Decreased short-term memory Following Commands: Follows one step commands inconsistently;Follows one step commands with increased time Safety/Judgement: Decreased awareness of safety;Decreased awareness of  deficits Awareness: Intellectual Problem Solving: Slow processing;Difficulty sequencing;Requires verbal cues;Decreased initiation;Requires tactile cues General Comments: pt unable to state whether she was falling backwards or forwards while sitting EOB, no awareness to deficits and following one step commands inconsistently        Exercises     Shoulder Instructions       General Comments      Pertinent Vitals/ Pain       Pain Assessment: Faces Faces Pain Scale: Hurts a little bit Pain Location: RUE when reaching over head for bed rail Pain Descriptors / Indicators: Discomfort;Grimacing Pain Intervention(s): Monitored during session;Repositioned  Home Living                                          Prior Functioning/Environment              Frequency  Min 2X/week        Progress Toward Goals  OT Goals(current goals can now be found in the care plan section)  Progress towards OT goals: Not progressing toward goals - comment (decreased ability to follow commands, impaired awareness)  Acute Rehab OT Goals Patient Stated Goal: none stated Time For Goal Achievement: 02/07/21 Potential to Achieve Goals: Fair  Plan Frequency remains appropriate;Discharge plan remains appropriate    Co-evaluation                 AM-PAC OT "6 Clicks" Daily Activity     Outcome Measure   Help from another person eating meals?: None Help from another person taking care of personal grooming?: A Lot Help from another person toileting, which includes using toliet, bedpan, or urinal?: Total Help from another person bathing (including washing, rinsing, drying)?: Total Help from another person to put on and taking off regular upper body clothing?: A Lot Help from another person to put on and taking off regular lower body clothing?: Total 6 Click Score: 11    End of Session    OT Visit Diagnosis: Unsteadiness on feet (R26.81);Other abnormalities of gait and  mobility (R26.89);Muscle weakness (generalized) (M62.81);Other symptoms and signs involving cognitive function;Other symptoms and signs involving the nervous system (R29.898)   Activity Tolerance Other (comment) (limited by decreased command following and impaired awareness)   Patient Left in bed;with call bell/phone within reach;with bed alarm set   Nurse Communication Mobility status;Other (comment) (unsafe to transfer OOB)        Time: 6063-0160 OT Time Calculation (min): 18 min  Charges: OT General Charges $OT Visit: 1 Visit OT Treatments $Self Care/Home Management : 8-22 mins  Lenor Derrick., COTA/L Acute Rehabilitation Services [phone]   Barron Schmid 01/31/2021, 10:49 AM

## 2021-01-31 NOTE — Consult Note (Signed)
Palliative Medicine Inpatient Consult Note  Reason for consult:  Goals of Care "Patient with bipolar and substance abuse.  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  She is not eating. Current plan if unable to get placed would be to go home with elderly parents."  HPI:  Per intake H&P --> 55 year old female past medical history of diabetes mellitus type 2, bipolar disorder type I, previous COVID infection, polysubstance abuse that includes cocaine.  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  She is not eating or drinking sufficiently.  Palliative care has been asked to get involved to further discuss goals of care in the setting of progressive failure to thrive.  Clinical Assessment/Goals of Care:  *Please note that this is a verbal dictation therefore any spelling or grammatical errors are due to the "[employer]" system interpretation.  I have reviewed medical records including EPIC notes, labs and imaging, received report from bedside RN, assessed the patient who is lying in bed with delayed responses.    I met with Carolyn Norton and called her mother, Carolyn Norton via telephone to further discuss diagnosis prognosis, [place], EOL wishes, disposition and options.   I introduced Palliative Medicine as specialized medical care for people living with serious illness. It focuses on providing relief from the symptoms and stress of a serious illness. The goal is to improve quality of life for both the patient and the family.  Carolyn Norton is able to share with me that she is from [place], [place].  She is lived here throughout her whole life.  She was married at 1 point in time though her husband is deceased.  She has never had any children.  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  She shares with me that nothing in her life brings her joy  anymore.  She expresses that she is of the [employer].  Prior to hospitalization per confirmation with patient's mother Carolyn Norton had not been ambulating for a number of weeks and her parents had been bathing dressing and getting her to and from the bathroom.  They have been preparing all of her meals.  Carolyn Norton had as of recently been fully dependent for all basic activities of daily living on her parents.  She did have a front wheel walker for mobility assistance though anytime she got up she would fall to the ground.  Per discussion with her parents she never passed out she was just so "weak".  Reviewed patient's past medical history in detail inclusive of her history of type I bipolar disorder, COPD, CKD, and COVID-19 infection.  Reviewed patient's ongoing failure to thrive of unclear ideology.  A detailed discussion was had today regarding advanced directives there are none on file the patient relies on her mother, Carolyn Norton for decisions.  Concepts specific to code status, artifical feeding and hydration, continued IV antibiotics and rehospitalization was had.    Reviewed that outcomes would likely be quite poor in the setting of a cardiopulmonary resuscitation.  Received confirmation of DO NOT RESUSCITATE DO NOT INTUBATE CODE STATUS.  Further discussed patient's poor nutritional state and whether or not she would wish for a gastric tube if it ever came down to that.  The patient herself shares that she would not.  We reviewed that her time likely on earth will be short if she continues to not eat or drink.  It is unclear if she  completely comprehends this though I was able to speak with her mother further about the topic - she is understanding of the magnitude of the situation at hand.  Goals for this time are for patients status to improve though she may continue to decline in which case further conversations regarding goals of care will need to ensue.  Discussed the importance of  continued conversation with family and their  medical providers regarding overall plan of care and treatment options, ensuring decisions are within the context of the patients values and GOCs.  Decision Maker: Carolyn Norton (mother) [phone]  SUMMARY OF RECOMMENDATIONS   DNAR/DNI, No G-Tube  Appreciate medical team providing patient's mother comprehensive medical update  Insights from neurology would be appreciated given patient's decline from a neurological perspective  Goals: Improve present condition. Per patients mother long term placement - she realizes that Carolyn Norton may continue to decline and if this occurs additional [place] conversations will be needed  Ongoing incremental palliative care support  Code Status/Advance Care Planning: DNAR/DNI   Palliative Prophylaxis:   Oral care, mobility  Additional Recommendations (Limitations, Scope, Preferences):  Continue current scope of care which is to treat what is treatable  Psycho-social/Spiritual:   Desire for further Chaplaincy support: Yes - [employer]  Additional Recommendations: Education on chronic drug abuse   Prognosis: Guarded in the setting of ongoing FTT  Discharge Planning: Discharge plan is uncertain.   Vitals:   01/31/21 0701 01/31/21 1600  BP: 125/65 108/67  Pulse: 73 68  Resp: 18 16  Temp: 98.9 F (37.2 C) 98.7 F (37.1 C)  SpO2: 97% 96%    Intake/Output Summary (Last 24 hours) at 01/31/2021 1621 Last data filed at 01/31/2021 1600 Gross per 24 hour  Intake 448.31 ml  Output 630 ml  Net -181.69 ml   Last Weight  Most recent update: 01/11/2021 11:52 AM   Weight  99.8 kg (220 lb)           Gen:  Caucasian F in NAD HEENT: moist mucous membranes CV: Regular rate and rhythm  PULM: clear to auscultation bilaterally  ABD: soft/nontender  EXT: No edema  Neuro: Lethargic, delayed responses  PPS: 30%   This conversation/these recommendations were discussed with patient primary care team, Dr.  Bonner Puna  Time In: 1530 Time Out: 1640 Total Time: 70 Greater than 50%  of this time was spent counseling and coordinating care related to the above assessment and plan.  [place] Team Team Cell Phone: [phone] Please utilize secure chat with additional questions, if there is no response within 30 minutes please call the above phone number  Palliative Medicine Team providers are available by phone from 7am to 7pm daily and can be reached through the team cell phone.  Should this patient require assistance outside of these hours, please call the patient's attending physician.

## 2021-02-01 LAB — CBC WITH DIFFERENTIAL/PLATELET
Abs Immature Granulocytes: 0.02 10*3/uL (ref 0.00–0.07)
Basophils Absolute: 0.1 10*3/uL (ref 0.0–0.1)
Basophils Relative: 1 %
Eosinophils Absolute: 0.2 10*3/uL (ref 0.0–0.5)
Eosinophils Relative: 2 %
HCT: 36.3 % (ref 36.0–46.0)
Hemoglobin: 12.1 g/dL (ref 12.0–15.0)
Immature Granulocytes: 0 %
Lymphocytes Relative: 52 %
Lymphs Abs: 4.2 10*3/uL — ABNORMAL HIGH (ref 0.7–4.0)
MCH: 29.2 pg (ref 26.0–34.0)
MCHC: 33.3 g/dL (ref 30.0–36.0)
MCV: 87.5 fL (ref 80.0–100.0)
Monocytes Absolute: 1 10*3/uL (ref 0.1–1.0)
Monocytes Relative: 13 %
Neutro Abs: 2.6 10*3/uL (ref 1.7–7.7)
Neutrophils Relative %: 32 %
Platelets: UNDETERMINED 10*3/uL (ref 150–400)
RBC: 4.15 MIL/uL (ref 3.87–5.11)
RDW: 14.7 % (ref 11.5–15.5)
WBC: 8 10*3/uL (ref 4.0–10.5)
nRBC: 0 % (ref 0.0–0.2)

## 2021-02-01 LAB — COMPREHENSIVE METABOLIC PANEL
ALT: 24 U/L (ref 0–44)
AST: 39 U/L (ref 15–41)
Albumin: 2.4 g/dL — ABNORMAL LOW (ref 3.5–5.0)
Alkaline Phosphatase: 48 U/L (ref 38–126)
Anion gap: 8 (ref 5–15)
BUN: 31 mg/dL — ABNORMAL HIGH (ref 6–20)
CO2: 29 mmol/L (ref 22–32)
Calcium: 8.6 mg/dL — ABNORMAL LOW (ref 8.9–10.3)
Chloride: 96 mmol/L — ABNORMAL LOW (ref 98–111)
Creatinine, Ser: 1.37 mg/dL — ABNORMAL HIGH (ref 0.44–1.00)
GFR, Estimated: 46 mL/min — ABNORMAL LOW (ref >60)
Glucose, Bld: 138 mg/dL — ABNORMAL HIGH (ref 70–99)
Potassium: 4.1 mmol/L (ref 3.5–5.1)
Sodium: 133 mmol/L — ABNORMAL LOW (ref 135–145)
Total Bilirubin: 0.8 mg/dL (ref 0.3–1.2)
Total Protein: 5.5 g/dL — ABNORMAL LOW (ref 6.5–8.1)

## 2021-02-01 LAB — GLUCOSE, CAPILLARY
Glucose-Capillary: 145 mg/dL — ABNORMAL HIGH (ref 70–99)
Glucose-Capillary: 178 mg/dL — ABNORMAL HIGH (ref 70–99)
Glucose-Capillary: 243 mg/dL — ABNORMAL HIGH (ref 70–99)
Glucose-Capillary: 145 mg/dL — ABNORMAL HIGH (ref 70–99)
Glucose-Capillary: 166 mg/dL — ABNORMAL HIGH (ref 70–99)

## 2021-02-01 LAB — SYPHILIS: RPR W/REFLEX TO RPR TITER AND TREPONEMAL ANTIBODIES, TRADITIONAL SCREENING AND DIAGNOSIS ALGORITHM: RPR Ser Ql: NONREACTIVE

## 2021-02-01 MED ORDER — DEXTROSE IN LACTATED RINGERS 5 % IV SOLN: INTRAVENOUS | Status: DC

## 2021-02-01 MED ORDER — SENNOSIDES-DOCUSATE SODIUM 8.6-50 MG PO TABS
2.0000 | ORAL_TABLET | Freq: Every day | ORAL | Status: DC
  Administered 2021-02-01 – 2021-03-03 (×29): 2 via ORAL
  Filled 2021-02-01 (×30): qty 2

## 2021-02-01 MED ORDER — DEXTROSE-NACL 5-0.45 % IV SOLN: INTRAVENOUS | Status: AC

## 2021-02-01 MED ORDER — BISACODYL 10 MG RE SUPP
10.0000 mg | Freq: Every day | RECTAL | Status: DC | PRN
  Administered 2021-03-20: 10 mg via RECTAL
  Filled 2021-02-01 (×2): qty 1

## 2021-02-01 NOTE — Progress Notes (Signed)
Patient BS dropped to 63 this shift. Patient was given Glucerna to improve BS, Pt was is not able to eat or drink much, she c/o of a toothache to her upper right side. PRN meds administered, on call provider was notified. New orders for IVF noted. See mar for details.

## 2021-02-01 NOTE — Plan of Care (Signed)
  Problem: Education: Goal: Knowledge of General Education information will improve Description: Including pain rating scale, medication(s)/side effects and non-pharmacologic comfort measures Outcome: Progressing   Problem: Health Behavior/Discharge Planning: Goal: Ability to manage health-related needs will improve Outcome: Progressing   Problem: Clinical Measurements: Goal: Ability to maintain clinical measurements within normal limits will improve Outcome: Progressing   Problem: Activity: Goal: Risk for activity intolerance will decrease Outcome: Progressing   Problem: Nutrition: Goal: Adequate nutrition will be maintained Outcome: Progressing   Problem: Coping: Goal: Level of anxiety will decrease Outcome: Progressing   Problem: Elimination: Goal: Will not experience complications related to bowel motility Outcome: Progressing   Problem: Pain Managment: Goal: General experience of comfort will improve Outcome: Progressing   Problem: Skin Integrity: Goal: Risk for impaired skin integrity will decrease Outcome: Progressing   

## 2021-02-01 NOTE — Plan of Care (Signed)
  Problem: Education: Goal: Knowledge of General Education information will improve Description: Including pain rating scale, medication(s)/side effects and non-pharmacologic comfort measures Outcome: Progressing   Problem: Health Behavior/Discharge Planning: Goal: Ability to manage health-related needs will improve Outcome: Progressing   Problem: Clinical Measurements: Goal: Ability to maintain clinical measurements within normal limits will improve Outcome: Progressing Goal: Will remain free from infection Outcome: Progressing   Problem: Activity: Goal: Risk for activity intolerance will decrease Outcome: Progressing   Problem: Nutrition: Goal: Adequate nutrition will be maintained Outcome: Progressing   Problem: Coping: Goal: Level of anxiety will decrease Outcome: Progressing   Problem: Elimination: Goal: Will not experience complications related to bowel motility Outcome: Progressing   Problem: Pain Managment: Goal: General experience of comfort will improve Outcome: Progressing   Problem: Safety: Goal: Ability to remain free from injury will improve Outcome: Progressing   

## 2021-02-01 NOTE — Progress Notes (Signed)
[facility] PROGRESS NOTE  Carolyn Norton XTK:[id number] DOB: [birth year] DOA: 01/11/2021 PCP: [name], FNP  Status: Remains inpatient appropriate because:Unsafe d/c plan  Dispo:  Patient From:  Home  Planned Disposition: To be determinedHome-family has agreed to take patient back when she is medically stable and able to mobilize independently with minimal assistance.  Medically stable for discharge:    Barriers to DC: Continues to require significant assistance with mobility.             Difficult to place: Yes  Level of care: Med-Surg  Code Status: Full Family Communication:  DVT prophylaxis: Lovenox COVID vaccination status: Unknown   HPI: Carolyn Norton is a 55 y.o. female with a history of poorly-controlled IDT2DM w/admission for HHS in Dec 2021, bipolar 1 disorder on multidrug regimen, polysubstance abuse with admission Feb 2022 for encephalopathy due to cocaine use, covid-19 infection who presented to the ED from home with her parents on 01/11/2021 with complaints of weakness, falls, worsening confusion seemingly having worsened since use of crack cocaine a month prior. In the ED she appeared severely dehydrated with AKI, hyperglycemia, and encephalopathic.   Her continued deconditioning has caused her to require higher level of care, though the patient initially declined this and was said to have capacity to make her own medical decisions by psychiatry. Her parents refused to take her back, and during hospitalization workup has been negative for reversible causes of encephalopathy which seems to have worsened. Further workup is underway. Palliative care is also consulted.  Subjective: Has limited grasp on current situation but denies any pain or shortness of breath or numbness or focal weakness.  Objective: Vitals:   02/01/21 0705 02/01/21 1249  BP:  116/66  Pulse:  69  Resp:  20  Temp:  98.4 F (36.9 C)  SpO2: 94% 100%    Intake/Output Summary  (Last 24 hours) at 02/01/2021 1443 Last data filed at 02/01/2021 1304 Gross per 24 hour  Intake 1585.97 ml  Output 850 ml  Net 735.97 ml   Filed Weights   01/11/21 1152  Weight: 99.8 kg    Exam: Gen: No distress Pulm: Clear and nonlabored on room air CV: RRR without murmur, no JVD. GI: +BS, soft, NT, ND Ext: Warm, no deformities. Skin: No new wounds/rashes on visualize skin Neuro: Alert, interactive. Doesn't recall me from a day ago. Unable to say where we are, thinks she's at drug rehab, says she's been clean since last Thursday, can't tell me the year, month or day. Moves all extremities without focal weakness. CNs intact. Psych: Impaired judgment, calm.  Assessment/Plan: Acute metabolic encephalopathy (multifactorial): Likely cocaine withdrawal, likely also now a component of acute delirium. Gait abnormalities are odd, so CT head repeated still revealing on acute etiology.  - Check MR brain - Has not yet had EEG though no seizurelike activity has been noted during 3 week hospitalization.  - Added RPR for completeness of work up, it's NR. Note HIV was NR, ammonia wnl, B12 wnl. Thiamine was normal (117)  at previous hospitalization in February.  Poorly-controlled T2DM with hyperglycemia at admission, also now with intermittent hypoglycemia: HbA1c 11%.  - Continue SSI, HS correction. Will stop 10u lantus with some hypoglycemia for now.  - Continue (newly started) metformin.    History of bipolar 1 disorder, polysubstance abuse: - Continue escitalopram, neurontin and depakote - Previously cleared by psych regarding orientation and capacity  Acute kidney injury on CKD 3a Acute kidney injury resolved after IV fluids,  though has resumed with poor per oral intake. Will restart IV fluids.   COPD: No exacerbation - Continue singulair, nebs as needed.  GERD - Continue PPI  Dyslipidemia - Continue statin  Hypokalemia/hypomagnesemia - Resolved  Thrombocytopenia:  - Currently  resolved.  Data Reviewed: Basic Metabolic Panel: Recent Labs  Lab 01/27/21 0351 02/01/21 0059  NA 134* 133*  K 4.2 4.1  CL 98 96*  CO2 29 29  GLUCOSE 330* 138*  BUN 16 31*  CREATININE 1.07* 1.37*  CALCIUM 8.9 8.6*   Liver Function Tests: Recent Labs  Lab 02/01/21 0059  AST 39  ALT 24  ALKPHOS 48  BILITOT 0.8  PROT 5.5*  ALBUMIN 2.4*   No results for input(s): LIPASE, AMYLASE in the last 168 hours. No results for input(s): AMMONIA in the last 168 hours. CBC: Recent Labs  Lab 02/01/21 0059  WBC 8.0  NEUTROABS 2.6  HGB 12.1  HCT 36.3  MCV 87.5  PLT PLATELET CLUMPS NOTED ON SMEAR, UNABLE TO ESTIMATE   Cardiac Enzymes: No results for input(s): CKTOTAL, CKMB, CKMBINDEX, TROPONINI in the last 168 hours. BNP (last 3 results) Recent Labs    01/14/21 0238 01/15/21 0230 01/16/21 0201  BNP 177.8* 115.9* 40.9    ProBNP (last 3 results) No results for input(s): PROBNP in the last 8760 hours.  CBG: Recent Labs  Lab 01/31/21 2106 01/31/21 2308 02/01/21 0432 02/01/21 0745 02/01/21 1148  GLUCAP 63* 112* 145* 178* 243*    No results found for this or any previous visit (from the past 240 hour(s)).   Studies: DG Abd 1 View  Result Date: 01/31/2021 CLINICAL DATA:  Constipation EXAM: ABDOMEN - 1 VIEW COMPARISON:  None. FINDINGS: Nonobstructive bowel gas pattern. Moderate stool burden in the colon. No acute osseous abnormality. IMPRESSION: No evidence of bowel obstruction. Moderate stool burden in the colon. Electronically Signed   By: Caprice Renshaw   On: 01/31/2021 13:36    Scheduled Meds: . aspirin  81 mg Oral Daily  . atorvastatin  20 mg Oral Daily  . divalproex  500 mg Oral BID  . enoxaparin (LOVENOX) injection  40 mg Subcutaneous Daily  . escitalopram  20 mg Oral Daily  . feeding supplement (GLUCERNA SHAKE)  237 mL Oral TID AC & HS  . gabapentin  300 mg Oral BID  . insulin aspart  0-15 Units Subcutaneous TID WC  . insulin aspart  0-5 Units Subcutaneous  QHS  . metFORMIN  850 mg Oral BID WC  . montelukast  10 mg Oral QHS  . pantoprazole  40 mg Oral Daily   Continuous Infusions: . sodium chloride 75 mL/hr at 01/31/21 1311    Principal Problem:   Bipolar 1 disorder, depressed, full remission (HCC) Active Problems:   Bipolar 1 disorder (HCC)   Generalized weakness   Consultants:  Psychiatry  Procedures:  None  Antibiotics: Anti-infectives (From admission, onward)   None       Time spent: 35 minutes  Tyrone Nine, MD  [facility] Please refer to Amion for contact info 20  days

## 2021-02-02 ENCOUNTER — Inpatient Hospital Stay (HOSPITAL_COMMUNITY): Payer: Medicaid Other

## 2021-02-02 LAB — COMPREHENSIVE METABOLIC PANEL
ALT: 25 U/L (ref 0–44)
AST: 36 U/L (ref 15–41)
Albumin: 2.4 g/dL — ABNORMAL LOW (ref 3.5–5.0)
Alkaline Phosphatase: 43 U/L (ref 38–126)
Anion gap: 8 (ref 5–15)
BUN: 17 mg/dL (ref 6–20)
CO2: 29 mmol/L (ref 22–32)
Calcium: 8.6 mg/dL — ABNORMAL LOW (ref 8.9–10.3)
Chloride: 97 mmol/L — ABNORMAL LOW (ref 98–111)
Creatinine, Ser: 0.99 mg/dL (ref 0.44–1.00)
GFR, Estimated: >60 mL/min (ref >60)
Glucose, Bld: 294 mg/dL — ABNORMAL HIGH (ref 70–99)
Potassium: 3.9 mmol/L (ref 3.5–5.1)
Sodium: 134 mmol/L — ABNORMAL LOW (ref 135–145)
Total Bilirubin: 0.4 mg/dL (ref 0.3–1.2)
Total Protein: 5.6 g/dL — ABNORMAL LOW (ref 6.5–8.1)

## 2021-02-02 LAB — CBC WITH DIFFERENTIAL/PLATELET
Abs Immature Granulocytes: 0.02 10*3/uL (ref 0.00–0.07)
Basophils Absolute: 0 10*3/uL (ref 0.0–0.1)
Basophils Relative: 1 %
Eosinophils Absolute: 0.1 10*3/uL (ref 0.0–0.5)
Eosinophils Relative: 1 %
HCT: 38.4 % (ref 36.0–46.0)
Hemoglobin: 13 g/dL (ref 12.0–15.0)
Immature Granulocytes: 0 %
Lymphocytes Relative: 41 %
Lymphs Abs: 2.1 10*3/uL (ref 0.7–4.0)
MCH: 29.3 pg (ref 26.0–34.0)
MCHC: 33.9 g/dL (ref 30.0–36.0)
MCV: 86.5 fL (ref 80.0–100.0)
Monocytes Absolute: 0.5 10*3/uL (ref 0.1–1.0)
Monocytes Relative: 10 %
Neutro Abs: 2.4 10*3/uL (ref 1.7–7.7)
Neutrophils Relative %: 47 %
Platelets: 104 10*3/uL — ABNORMAL LOW (ref 150–400)
RBC: 4.44 MIL/uL (ref 3.87–5.11)
RDW: 14.5 % (ref 11.5–15.5)
WBC: 5.1 10*3/uL (ref 4.0–10.5)
nRBC: 0 % (ref 0.0–0.2)

## 2021-02-02 LAB — GLUCOSE, CAPILLARY
Glucose-Capillary: 396 mg/dL — ABNORMAL HIGH (ref 70–99)
Glucose-Capillary: 314 mg/dL — ABNORMAL HIGH (ref 70–99)
Glucose-Capillary: 223 mg/dL — ABNORMAL HIGH (ref 70–99)
Glucose-Capillary: 103 mg/dL — ABNORMAL HIGH (ref 70–99)
Glucose-Capillary: 200 mg/dL — ABNORMAL HIGH (ref 70–99)

## 2021-02-02 MED ORDER — GADOBUTROL 1 MMOL/ML IV SOLN
10.0000 mL | Freq: Once | INTRAVENOUS | Status: AC | PRN
  Administered 2021-02-02: 10 mL via INTRAVENOUS

## 2021-02-02 MED ORDER — THIAMINE HCL 100 MG PO TABS
100.0000 mg | ORAL_TABLET | Freq: Every day | ORAL | Status: DC
  Administered 2021-02-02 – 2021-03-04 (×31): 100 mg via ORAL
  Filled 2021-02-02 (×33): qty 1

## 2021-02-02 NOTE — Progress Notes (Signed)
[facility] PROGRESS NOTE  Carolyn Norton JKK:[id number] DOB: 1966/06/05 DOA: 01/11/2021 PCP: [name], FNP  Status: Remains inpatient appropriate because:Unsafe d/c plan  Dispo:  Patient From:  Home  Planned Disposition: To be determinedHome-family has agreed to take patient back when she is medically stable and able to mobilize independently with minimal assistance.  Medically stable for discharge:    Barriers to DC: Continues to require significant assistance with mobility.             Difficult to place: Yes  Level of care: Med-Surg  Code Status: Full Family Communication: None at bedside. Will call mother once neurology evaluations complete for update. DVT prophylaxis: Lovenox COVID vaccination status: Unknown   HPI: Carolyn Norton is a 55 y.o. female with a history of poorly-controlled IDT2DM w/admission for HHS in Dec 2021, bipolar 1 disorder on multidrug regimen, polysubstance abuse with admission Feb 2022 for encephalopathy due to cocaine use, covid-19 infection who presented to the ED from home with her parents on 01/11/2021 with complaints of weakness, falls, worsening confusion seemingly having worsened since use of crack cocaine a month prior. In the ED she appeared severely dehydrated with AKI, hyperglycemia, and encephalopathic.   Her continued deconditioning has caused her to require higher level of care, though the patient initially declined this and was said to have capacity to make her own medical decisions by psychiatry. Her parents refused to take her back, and during hospitalization workup has been negative for reversible causes of encephalopathy which seems to have worsened. Further workup is underway with MRI and neurology consultation. Palliative care is also consulted.  Subjective: No major changes per pt. Remains not entirely oriented but interactive. States she's not hungry at all. Denies dizziness, HA, vision or hearing changes, N/V.    Objective: Vitals:   02/01/21 1249 02/01/21 2105  BP: 116/66 (!) 114/50  Pulse: 69 76  Resp: 20 18  Temp: 98.4 F (36.9 C) 97.9 F (36.6 C)  SpO2: 100%     Intake/Output Summary (Last 24 hours) at 02/02/2021 0951 Last data filed at 02/02/2021 0600 Gross per 24 hour  Intake 1023.66 ml  Output 1250 ml  Net -226.34 ml   Filed Weights   01/11/21 1152  Weight: 99.8 kg    Exam: Gen: No distress Pulm: Clear, nonlabored CV: RRR without M/R/G. GI: +BS, soft, NT Ext: Warm Skin: No new wounds/rashes on visualize skin. Dry throughout. Neuro: Sleeping but rouses and interacts at times without having eyes open, poor eye contact overall. Movers all extremities and follows one step commands. Not oriented. Psych: Impaired judgment and insight.  Assessment/Plan: Acute metabolic encephalopathy (multifactorial): Likely cocaine withdrawal, likely also now a component of acute delirium. Gait abnormalities are odd, so CT head repeated still revealing on acute etiology.  - Check MR brain, pending.  - Has not yet had EEG though no seizurelike activity has been noted during 3 week hospitalization.  - HIV and RPR are NR, ammonia wnl, B12 wnl. Thiamine was normal (117) at previous hospitalization in February, was discharged on 90 day supply. After discussion with neurology, who is formally consulted, will check thiamine (as she hasn't been getting any supplementation during this hospitalization) and empirically supplement. - Appreciate assistance from neurology.  Poorly-controlled T2DM with hyperglycemia at admission, also now with intermittent hypoglycemia: HbA1c 11%.  - Continue SSI, HS correction. Stopped lantus with intermittent hypoglycemia. - Continue (newly started) metformin.    History of bipolar 1 disorder, polysubstance abuse: - Continue escitalopram,  neurontin and depakote - Previously cleared by psych regarding orientation and capacity, though suspect her exam has changed from that  time.   Acute kidney injury on CKD 3a: Acute kidney injury resolved after IV fluids, though has resumed with poor per oral intake. - IVF restarted, will recheck metabolic panel this AM.   COPD: No exacerbation - Continue singulair, nebs as needed.  GERD - Continue PPI  Dyslipidemia - Continue statin  Hypokalemia/hypomagnesemia - Resolved  Thrombocytopenia:  - Currently resolved.  Data Reviewed: Basic Metabolic Panel: Recent Labs  Lab 01/27/21 0351 02/01/21 0059  NA 134* 133*  K 4.2 4.1  CL 98 96*  CO2 29 29  GLUCOSE 330* 138*  BUN 16 31*  CREATININE 1.07* 1.37*  CALCIUM 8.9 8.6*   Liver Function Tests: Recent Labs  Lab 02/01/21 0059  AST 39  ALT 24  ALKPHOS 48  BILITOT 0.8  PROT 5.5*  ALBUMIN 2.4*   No results for input(s): LIPASE, AMYLASE in the last 168 hours. No results for input(s): AMMONIA in the last 168 hours. CBC: Recent Labs  Lab 02/01/21 0059  WBC 8.0  NEUTROABS 2.6  HGB 12.1  HCT 36.3  MCV 87.5  PLT PLATELET CLUMPS NOTED ON SMEAR, UNABLE TO ESTIMATE   Cardiac Enzymes: No results for input(s): CKTOTAL, CKMB, CKMBINDEX, TROPONINI in the last 168 hours. BNP (last 3 results) Recent Labs    01/14/21 0238 01/15/21 0230 01/16/21 0201  BNP 177.8* 115.9* 40.9    ProBNP (last 3 results) No results for input(s): PROBNP in the last 8760 hours.  CBG: Recent Labs  Lab 02/01/21 1148 02/01/21 1545 02/01/21 2050 02/02/21 0453 02/02/21 0750  GLUCAP 243* 145* 166* 396* 314*    No results found for this or any previous visit (from the past 240 hour(s)).   Studies: DG Abd 1 View  Result Date: 01/31/2021 CLINICAL DATA:  Constipation EXAM: ABDOMEN - 1 VIEW COMPARISON:  None. FINDINGS: Nonobstructive bowel gas pattern. Moderate stool burden in the colon. No acute osseous abnormality. IMPRESSION: No evidence of bowel obstruction. Moderate stool burden in the colon. Electronically Signed   By: Caprice Renshaw   On: 01/31/2021 13:36    Scheduled  Meds: . aspirin  81 mg Oral Daily  . atorvastatin  20 mg Oral Daily  . divalproex  500 mg Oral BID  . enoxaparin (LOVENOX) injection  40 mg Subcutaneous Daily  . escitalopram  20 mg Oral Daily  . feeding supplement (GLUCERNA SHAKE)  237 mL Oral TID AC & HS  . gabapentin  300 mg Oral BID  . insulin aspart  0-15 Units Subcutaneous TID WC  . insulin aspart  0-5 Units Subcutaneous QHS  . metFORMIN  850 mg Oral BID WC  . montelukast  10 mg Oral QHS  . pantoprazole  40 mg Oral Daily  . senna-docusate  2 tablet Oral Daily   Continuous Infusions: . sodium chloride 75 mL/hr at 02/01/21 2055  . dextrose 5% lactated ringers 75 mL/hr at 02/01/21 1612    Principal Problem:   Bipolar 1 disorder, depressed, full remission (HCC) Active Problems:   Bipolar 1 disorder (HCC)   Generalized weakness   Consultants:  Psychiatry  Procedures:  None  Antibiotics: Anti-infectives (From admission, onward)   None       Time spent: 35 minutes  Tyrone Nine, MD  [facility] Please refer to [facility] for contact info 21  days

## 2021-02-02 NOTE — Progress Notes (Signed)
   Palliative Medicine Inpatient Follow Up Note  Reason for consult:  Goals of Care "Patient with bipolar and substance abuse.  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  She is not eating. Current plan if unable to get placed would be to go home with elderly parents."  HPI:  Per intake H&P --> 55 year old female past medical history of diabetes mellitus type 2, bipolar disorder type I, previous COVID infection, polysubstance abuse that includes cocaine.  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  She is not eating or drinking sufficiently. Palliative care has been asked to get involved to further discuss goals of care in the setting of progressive failure to thrive.  Today's Discussion (02/02/2021):  *Please note that this is a verbal dictation therefore any spelling or grammatical errors are due to the "[place]" system interpretation.   I met at bedside this afternoon with Carolyn Norton and her aunt Carolyn Norton.  Carolyn Norton is far more bright eyed today and able to openly converse with slow responses as compared to two days prior.    Neurology has been consulted in the setting of Carolyn Norton's recurrent falls and ongoing incontinence. 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  Repeat MRI has been pursued.  Patient's aunt, Carolyn Norton shares with me that the goals are for medical optimization of Carolyn Norton.  We reviewed that Carolyn Norton is going to need long-term placement as she is no longer able to be cared for by her parents due to their elderly and frail state.    Carolyn Norton expresses frustration about patient not getting disability and/or Medicaid.  We reviewed that this will likely be the only way she will be able to safely transition into a long-term custodial care situation.  Going conversations to take place throughout the oncoming days.  Questions and concerns  addressed   Objective Assessment: Vital Signs Vitals:   02/02/21 0710 02/02/21 1218  BP:  115/78  Pulse:  63  Resp:  18  Temp:  98.7 F (37.1 C)  SpO2: 100% 100%    Intake/Output Summary (Last 24 hours) at 02/02/2021 1701 Last data filed at 02/02/2021 1220 Gross per 24 hour  Intake 1023.66 ml  Output 1100 ml  Net -76.34 ml   Last Weight  Most recent update: 01/11/2021 11:52 AM   Weight  99.8 kg (220 lb)           Gen:  Caucasian F in NAD HEENT: moist mucous membranes CV: Regular rate and rhythm  PULM: clear to auscultation bilaterally  ABD: soft/nontender  EXT: No edema  Neuro: Awake, alert  SUMMARY OF RECOMMENDATIONS DNAR/DNI, No G-Tube  Goals: Improve present condition. Per patients mother long term placement - she realizes that Carolyn Norton may continue to decline and if this occurs additional [place] conversations will be needed  Ongoing incremental palliative care support  Time Spent: 25 Greater than 50% of the time was spent in counseling and coordination of care ______________________________________________________________________________________ [facility] Team Team Cell Phone: [phone] Please utilize secure chat with additional questions, if there is no response within 30 minutes please call the above phone number  Palliative Medicine Team providers are available by phone from 7am to 7pm daily and can be reached through the team cell phone.  Should this patient require assistance outside of these hours, please call the patient's attending physician.

## 2021-02-02 NOTE — Consult Note (Addendum)
Neurology Consultation  Reason for Consult: AMS Referring Physician: Aubery Lapping, MD.   CC: Patient unable to relate.   History is obtained from: chart  HPI: Carolyn Norton is a 55 y.o. female with a PMHx of Bipolar I disorder, anxiety, depression, HTN, HLD, COPD, CKD 3b, uncontrolled DM II, and Cocaine abuse. Patient was admitted 21 days ago with AMS, generalized weakness, gait abnormality with falls at home, and non adherence to medication regimen. Family confirmed that her last use of cocaine was 1 month prior to admission and that she had a great decline since. UDS negative on admission but was + for Benzodiazepines and Cocaine in Feb 2022. Workup in ED showed severe dehydration with uncontrolled DM II. She was placed on LLS team.   Since admission, patient was evaluated by psychiatry who deemed her capable of making decisions. Psychiatry recommended continue Neurontin, Depakote and Lexapro. Patient agreed to go to SNF, but with her substance abuse and lack of funds, she is not a candidate. Her parents have refused to let patient come back to their house.   So far, her workup has not revealed any reversible causes of encephalopathy and per hospitalist, it seems to be worsening. Her metabolic encephalopathy has previously been labeled acute delirium and likely Cocaine withdrawal.   Neurology consulted due to continued and possibly worsening of her mental status with question of any findings of reversible reason for AMS.    ROS: No HA, blurred vision, diplopia, pain, n/v, SOB, dysuria, weakness of an individual limb, numbness or tingling, sensation changes, dysarthria, or aphasia.   Past Medical History:  Diagnosis Date  . Anxiety   . Asthma   . COVID    History of  . Depression   . Diabetes mellitus without complication (HCC)   . Hyperlipidemia   . Hypertension   . Seasonal allergies     Family History  Problem Relation Age of Onset  . Hypertension Mother   . Cancer Mother    . Kidney cancer Mother   . Cancer Father        prostate  . Breast cancer Paternal Grandmother   . Leukemia Brother     Social History:   reports that she has been smoking cigarettes. She has been smoking about 0.50 packs per day. She has never used smokeless tobacco. She reports current drug use. Drug: Cocaine. She reports that she does not drink alcohol.  Medications  Current Facility-Administered Medications:  .  0.9 %  sodium chloride infusion, , Intravenous, Continuous, Russella Dar, NP, Last Rate: 75 mL/hr at 02/01/21 2055, New Bag at 02/01/21 2055 .  acetaminophen (TYLENOL) tablet 650 mg, 650 mg, Oral, Q6H PRN, Darlin Drop, DO, 650 mg at 01/31/21 2323 .  albuterol (PROVENTIL) (2.5 MG/3ML) 0.083% nebulizer solution 3 mL, 3 mL, Nebulization, Q6H PRN, Hall, Carole N, DO .  aspirin chewable tablet 81 mg, 81 mg, Oral, Daily, Leroy Sea, MD, 81 mg at 02/01/21 0921 .  atorvastatin (LIPITOR) tablet 20 mg, 20 mg, Oral, Daily, Hall, Carole N, DO, 20 mg at 02/01/21 5102 .  bisacodyl (DULCOLAX) suppository 10 mg, 10 mg, Rectal, Daily PRN, Hazeline Junker B, MD .  dextrose 5 % in lactated ringers infusion, , Intravenous, Continuous, Tyrone Nine, MD, Last Rate: 75 mL/hr at 02/01/21 1612, New Bag at 02/01/21 1612 .  dextrose 50 % solution 0-50 mL, 0-50 mL, Intravenous, PRN, Mancel Bale, MD, 50 mL at 01/26/21 1722 .  divalproex (DEPAKOTE) DR tablet  500 mg, 500 mg, Oral, BID, Leroy Sea, MD, 500 mg at 02/02/21 0842 .  enoxaparin (LOVENOX) injection 40 mg, 40 mg, Subcutaneous, Daily, Hall, Carole N, DO, 40 mg at 02/02/21 0840 .  escitalopram (LEXAPRO) tablet 20 mg, 20 mg, Oral, Daily, Hall, Carole N, DO, 20 mg at 02/02/21 0840 .  feeding supplement (GLUCERNA SHAKE) (GLUCERNA SHAKE) liquid 237 mL, 237 mL, Oral, TID AC & HS, [name] L, NP, 237 mL at 02/02/21 0840 .  gabapentin (NEURONTIN) capsule 300 mg, 300 mg, Oral, BID, Leroy Sea, MD, 300 mg at 02/02/21 0841 .   insulin aspart (novoLOG) injection 0-15 Units, 0-15 Units, Subcutaneous, TID WC, Tyrone Nine, MD, 11 Units at 02/02/21 [phone] .  insulin aspart (novoLOG) injection 0-5 Units, 0-5 Units, Subcutaneous, QHS, Jarvis Newcomer, Ryan B, MD .  melatonin tablet 3 mg, 3 mg, Oral, QHS PRN, Darlin Drop, DO, 3 mg at 01/30/21 2034 .  metFORMIN (GLUCOPHAGE) tablet 850 mg, 850 mg, Oral, BID WC, Russella Dar, NP, 850 mg at 02/02/21 0841 .  montelukast (SINGULAIR) tablet 10 mg, 10 mg, Oral, QHS, Hall, Carole N, DO, 10 mg at 02/01/21 2056 .  ondansetron (ZOFRAN) injection 4 mg, 4 mg, Intravenous, Q6H PRN, Hall, Carole N, DO .  pantoprazole (PROTONIX) EC tablet 40 mg, 40 mg, Oral, Daily, Hall, Carole N, DO, 40 mg at 02/02/21 0841 .  senna-docusate (Senokot-S) tablet 2 tablet, 2 tablet, Oral, Daily, Tyrone Nine, MD, 2 tablet at 02/02/21 0841   Exam: Current vital signs: BP (!) 114/50 (BP Location: Left Arm)   Pulse 76   Temp 97.9 F (36.6 C) (Oral)   Resp 18   Ht 5\' 6"  (1.676 m)   Wt 99.8 kg   LMP 01/26/2017 (Approximate)   SpO2 100%   BMI 35.51 kg/m  Vital signs in last 24 hours: Temp:  [97.9 F (36.6 C)-98.4 F (36.9 C)] 97.9 F (36.6 C) (05/28 2105) Pulse Rate:  [69-76] 76 (05/28 2105) Resp:  [18-20] 18 (05/28 2105) BP: (114-116)/(50-66) 114/50 (05/28 2105) SpO2:  [100 %] 100 % (05/28 1249)  PE: GENERAL: Fairly well appearing female who is awake and alert. NAD.  HEENT: - Normocephalic and atraumatic LUNGS - Normal respiratory effort.  CV - RRR. ABDOMEN - Soft, non tender. Ext: warm, well perfused. Psych: affect a little bizarre, but she is calm, cooperative. When NP comes in the room, she is all smiles and was animated in saying hello, then withdrawn and distracted.   NEURO:  Mental Status: Oriented to name and age. Knows she is in the hospital when NP gives her choices of where she is. She does not know why she is here. Disoriented to month, day, date, or year. Follows some some simple  commands but is easily distracted.  Speech/Language: speech is without dysarthria or aphasia. Naming, repetition, fluency, and comprehension intact.  Unable to spell WORLD backwards. She can say all of her ABCs.   Cranial Nerves:  II: PERRL 30mm/brisk. visual fields full.  III, IV, VI: EOMI. Lid elevation symmetric and full.  V: sensation is intact and symmetrical to face.  VII: Smile is symmetrical. Able to puff cheeks and raise eyebrows.  VIII:hearing intact to voice IX, X: palate elevation is symmetric. Phonation normal.  XI: normal sternocleidomastoid and trapezius muscle strength. XII: tongue is symmetrical without fasciculations.   Motor: She moves her extremities purposefully. She does not comprehend strength commands. However, she can hold both arms and legs up.  Tone is normal. Bulk is normal.  Sensation- Intact to light touch bilaterally in all four extremities. Extinction absent to light touch to DSS.  Coordination: No pronator drift. She is able to do FNF with one hand, but then uses same hand when asked to do other hand. She also grabs other hand and holds it when asked to perform. DTRs: RUE:  biceps  1+    brachioradialis   1+             RLE:  patella  0                LUE:  biceps  1+   brachioradialis   1+            LLE: patella  0    Gait- deferred  Labs I have reviewed labs in epic and the results pertinent to this consultation are: Ammonia WNL, B12 707, HIV non reactive, RPR negative, creat 1.37 (was 2.8), WBCC 8K, TSH 1.750, UA negative.   CBC    Component Value Date/Time   WBC 8.0 02/01/2021 0059   RBC 4.15 02/01/2021 0059   HGB 12.1 02/01/2021 0059   HCT 36.3 02/01/2021 0059   PLT PLATELET CLUMPS NOTED ON SMEAR, UNABLE TO ESTIMATE 02/01/2021 0059   MCV 87.5 02/01/2021 0059   MCH 29.2 02/01/2021 0059   MCHC 33.3 02/01/2021 0059   RDW 14.7 02/01/2021 0059   LYMPHSABS 4.2 (H) 02/01/2021 0059   MONOABS 1.0 02/01/2021 0059   EOSABS 0.2 02/01/2021 0059    BASOSABS 0.1 02/01/2021 0059    CMP     Component Value Date/Time   NA 133 (L) 02/01/2021 0059   K 4.1 02/01/2021 0059   CL 96 (L) 02/01/2021 0059   CO2 29 02/01/2021 0059   GLUCOSE 138 (H) 02/01/2021 0059   BUN 31 (H) 02/01/2021 0059   CREATININE 1.37 (H) 02/01/2021 0059   CALCIUM 8.6 (L) 02/01/2021 0059   PROT 5.5 (L) 02/01/2021 0059   ALBUMIN 2.4 (L) 02/01/2021 0059   AST 39 02/01/2021 0059   ALT 24 02/01/2021 0059   ALKPHOS 48 02/01/2021 0059   BILITOT 0.8 02/01/2021 0059   GFRNONAA 46 (L) 02/01/2021 0059   GFRAA >60 12/12/2017 0408    Imaging  CT head 01/11/21 1. No acute intracranial abnormality. 2. Similar-appearing prominence of the lateral ventricles may be related to central predominant atrophy, although a component of normal pressure/communicating hydrocephalus cannot be excluded.  02/01/21 No evidence of acute intracranial hemorrhage or acute infarction. Unchanged prominence of the lateral and third ventricles as compared to the head CT of 01/11/2021. This may be secondary to central redominant atrophy, or communicating/normal pressure hydrocephalus in the appropriate clinical setting. Stable generalized parenchymal atrophy and cerebral white matter chronic small vessel ischemic disease.  Assessment: 55 yo female who was admitted 21 days ago for multiple issues and has had worsening AMS as days progress. There has been no reversible cause identified thus far.  She does have enlarged ventricles on CT and she does have symptoms concerning for possible NPH.  The degree of atrophy seen on CT, however, is very unusual for someone in their 8250s, but with her history of substance abuse this could be the etiology of her atrophy.  Impression: 1. Encephalopathy, multifactorial.  2. Substance abuse with Cocaine.   Recommendations/Plan:  -B1 level and then start Thiamine 100mg  po qd.  -MRI brain with and without contrast.  -Continue to correct metabolic derangements as  you are doing.  -  Continue psych f/up as NP feels there is likely a long history of psychiatric issues.  -Fall precautions.  -Agree with SW/case management involvement for disposition.  -Poly substance abuse counseling.   NP Addendum:  Patient's mother called NP back. Patient's overall status has declined since COVID infection in 12/21. Mother states that patient had gait disturbance as shuffling her feet in 1/22 with further decline since then evidenced by her needing a cane after COVID to just sitting around now. She has fallen several times at home over 6 months. She has had stool and urinary incontinence over 1.5 months. Mother states the incontinence is not due to a delay to the bathroom, but rather the patient just "goes" without warning. Patient's confusion began after COVID infection and patient has needed help with ADLs since then. However, mother thinks her confusion has gotten worse since being here. Patient has not been able to work since COVID in 12/21. 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 Mother states patient uses Cocaine, THC, and ETOH. Last Cocaine per mother, 1 month prior to hospitalization, but she is really not sure.   Given above information, NPH is a possible cause of her presentation. Check MRI brain.   Pt seen by Jimmye Norman, NP/Neuro and later by MD. Note/plan to be edited by MD as needed.  Pager: [phone]   I have seen the patient and reviewed the above note.  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  She had a normal B1 level in February, but given that she can become deficient fairly quickly, and there was report of  weight loss, I think that supplementation would be prudent.  Psychiatric comorbidities may be playing some role, but her degree of encephalopathy is out of proportion to solely psychiatric issues.  I certainly think that the first step would be an MRI, if there is significant transependymal flow, and further consideration  would need to be made towards large-volume LP with timed gait.  Ritta Slot, MD [facility] [phone]  If 7pm- 7am, please page neurology on call as listed in AMION.

## 2021-02-03 ENCOUNTER — Inpatient Hospital Stay (HOSPITAL_COMMUNITY): Payer: Medicaid Other

## 2021-02-03 DIAGNOSIS — R4182 Altered mental status, unspecified: Secondary | ICD-10-CM

## 2021-02-03 LAB — GLUCOSE, CAPILLARY
Glucose-Capillary: 575 mg/dL (ref 70–99)
Glucose-Capillary: >600 mg/dL (ref 70–99)
Glucose-Capillary: >600 mg/dL (ref 70–99)
Glucose-Capillary: 547 mg/dL (ref 70–99)
Glucose-Capillary: 484 mg/dL — ABNORMAL HIGH (ref 70–99)
Glucose-Capillary: 359 mg/dL — ABNORMAL HIGH (ref 70–99)
Glucose-Capillary: 355 mg/dL — ABNORMAL HIGH (ref 70–99)
Glucose-Capillary: 156 mg/dL — ABNORMAL HIGH (ref 70–99)
Glucose-Capillary: 112 mg/dL — ABNORMAL HIGH (ref 70–99)

## 2021-02-03 MED ORDER — INSULIN ASPART 100 UNIT/ML IJ SOLN
10.0000 [IU] | Freq: Once | INTRAMUSCULAR | Status: AC
  Administered 2021-02-03: 10 [IU] via SUBCUTANEOUS

## 2021-02-03 MED ORDER — SODIUM CHLORIDE 0.9 % IV SOLN: INTRAVENOUS | Status: DC

## 2021-02-03 MED ORDER — VITAMIN D 25 MCG (1000 UNIT) PO TABS
1000.0000 [IU] | ORAL_TABLET | Freq: Every day | ORAL | Status: DC
  Administered 2021-02-03 – 2021-03-04 (×30): 1000 [IU] via ORAL
  Filled 2021-02-03 (×31): qty 1

## 2021-02-03 MED ORDER — INSULIN ASPART 100 UNIT/ML IJ SOLN
0.0000 [IU] | Freq: Three times a day (TID) | INTRAMUSCULAR | Status: DC
  Administered 2021-02-03: 11 [IU] via SUBCUTANEOUS
  Administered 2021-02-03: 10 [IU] via SUBCUTANEOUS
  Administered 2021-02-04: 8 [IU] via SUBCUTANEOUS
  Administered 2021-02-04: 5 [IU] via SUBCUTANEOUS
  Administered 2021-02-04 – 2021-02-05 (×2): 8 [IU] via SUBCUTANEOUS
  Administered 2021-02-05: 3 [IU] via SUBCUTANEOUS
  Administered 2021-02-06: 5 [IU] via SUBCUTANEOUS
  Administered 2021-02-06 – 2021-02-07 (×2): 3 [IU] via SUBCUTANEOUS
  Administered 2021-02-07 – 2021-02-08 (×2): 5 [IU] via SUBCUTANEOUS
  Administered 2021-02-08 (×2): 3 [IU] via SUBCUTANEOUS
  Administered 2021-02-09: 5 [IU] via SUBCUTANEOUS
  Administered 2021-02-09: 8 [IU] via SUBCUTANEOUS
  Administered 2021-02-10: 11 [IU] via SUBCUTANEOUS

## 2021-02-03 MED ORDER — MAGNESIUM HYDROXIDE 400 MG/5ML PO SUSP
30.0000 mL | Freq: Once | ORAL | Status: AC
  Administered 2021-02-03: 30 mL via ORAL
  Filled 2021-02-03: qty 30

## 2021-02-03 MED ORDER — METFORMIN HCL 500 MG PO TABS
1000.0000 mg | ORAL_TABLET | Freq: Two times a day (BID) | ORAL | Status: DC
  Administered 2021-02-03 – 2021-03-03 (×54): 1000 mg via ORAL
  Filled 2021-02-03 (×55): qty 2

## 2021-02-03 MED ORDER — INSULIN ASPART PROT & ASPART (70-30 MIX) 100 UNIT/ML ~~LOC~~ SUSP
8.0000 [IU] | Freq: Two times a day (BID) | SUBCUTANEOUS | Status: DC
  Administered 2021-02-03 – 2021-02-04 (×2): 8 [IU] via SUBCUTANEOUS
  Filled 2021-02-03: qty 10

## 2021-02-03 NOTE — Procedures (Signed)
Patient Name: Carolyn Norton  MRN: [id number]  Epilepsy Attending: Charlsie Quest  Referring Physician/Provider: DR Brooke Dare Date: 02/03/2021 Duration: 31.58 mins  Patient history: 55 yo female who was admitted 21 days ago for multiple issues and has had worsening AMS as days progress. EEG to evaluate for seizure   Level of alertness: Awake  AEDs during EEG study: VPA, GBP  Technical aspects: This EEG study was done with scalp electrodes positioned according to the 10-20 International system of electrode placement. Electrical activity was acquired at a sampling rate of 500Hz  and reviewed with a high frequency filter of 70Hz  and a low frequency filter of 1Hz . EEG data were recorded continuously and digitally stored.   Description: No posterior dominant rhythm was seen. EEG showed continuous generalized 3 to 7 Hz theta-delta slowing.  Hyperventilation and photic stimulation were not performed.     Of note, study was technically difficult due to significant movement and sweat artifact.   ABNORMALITY - Continuous slow, generalized  IMPRESSION: This technically difficult study is suggestive of moderate diffuse encephalopathy, nonspecific etiology. No seizures or epileptiform discharges were seen throughout the recording.  Carolyn Norton 

## 2021-02-03 NOTE — Progress Notes (Signed)
EEG complete - results pending 

## 2021-02-03 NOTE — Progress Notes (Addendum)
[facility] PROGRESS NOTE  Olyvia Gopal ENI:[id number] DOB: 09/08/1965 DOA: 01/11/2021 PCP: [name], FNP  Status: Remains inpatient appropriate because:Unsafe d/c plan  Dispo:  Patient From:  Home  Planned Disposition: To be determinedHome-family has agreed to take patient back when she is medically stable and able to mobilize independently with minimal assistance.  Medically stable for discharge:  NO  Barriers to DC: Continues to require significant assistance with mobility.             Difficult to place: Yes  Level of care: Med-Surg  Code Status: Full Family Communication: 5/30 father-regarding possible NPH diagnosis DVT prophylaxis: Lovenox COVID vaccination status: Unknown   HPI: 55 year old female past medical history of diabetes mellitus type 2, bipolar disorder type I, previous COVID infection, polysubstance abuse that includes cocaine.  She presented from home where she lives with her parents.  Her presenting symptoms included generalized weakness, multiple falls and confusion.  Family confirmed that she had been having the symptoms for at least a month with last use of crack cocaine 1 month prior to presentation.  Ever since that time she had not been doing well.  She would also be having issues with hyperglycemia.  In the ER she was diagnosed with severe dehydration, hyperglycemia and metabolic encephalopathy.  Since admission patient has been evaluated by the psychiatric team who has deemed her alert and oriented and having capacity to make decisions.  Patient is agreeable to placement at a rehab.  Currently she has severe physical deconditioning which was POA and PT is recommending SNF.  Because of her history of polysubstance abuse and lack of funding SNF is not an option.  Family is agreeable to taking her back in home when she is able to ambulate well enough to get back and forth for meals into the bathroom.  Subjective: Alert.  Sitting up in bed.   EEG in progress.  No specific complaints verbalized.  Remains with poor appetite and not eating.  Objective: Vitals:   02/02/21 2009 02/03/21 0402  BP: 106/76 (!) 134/54  Pulse: 69 75  Resp: 20 20  Temp: 98.3 F (36.8 C) 97.9 F (36.6 C)  SpO2: 97% 95%    Intake/Output Summary (Last 24 hours) at 02/03/2021 0749 Last data filed at 02/03/2021 0600 Gross per 24 hour  Intake 825 ml  Output 300 ml  Net 525 ml   Filed Weights   01/11/21 1152  Weight: 99.8 kg    Exam:  Constitutional: Alert, calm, no acute distress Respiratory: Lungs are clear, stable on room air Cardiac: Normal heart sounds, no tachycardia, no peripheral Abdomen: LBM 5/23, normoactive bowel sounds.  Abdomen soft nontender nondistended.  Continues with poor appetite and anorexic symptoms Neurologic: CN 2-12 grossly intact. Sensation intact, Strength 4/5 x all 4 extremities.  Continues to have significant ambulatory dysfunction and gait disturbance  Psychiatric: Awake, oriented times place only.  Pleasant affect.  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 Iver Nestle are working with PT to arrange this study Suspect neurological status also contributing to  patient's poor appetite Patient also with documented issues of bowel and bladder incontinence prior to admission  Dental pain Reported to staff nurse several  days ago she was having a tooth ache on the right upper jaw Recent MRI without mention of dental caries or abscesses Persistent hyperglycemia so will check orthopantogram  Anorexia/constipation Likely related to underlying neurocognitive issues Check urinalysis and culture to rule out possible UTI as etiology Check KUB with moderate colonic stool burden We will give 1 dose of milk of magnesia and monitor for result NS initiated 5/27-given persistent poor intake over the WE changed to D5LR but unfortunately developed hyperglycemia so fluids changed back to NS  Diabetes mellitus 2 with hyperglycemia on insulin Hemoglobin A1c 11.0 CBGS elevated-will dc dextrose containing IVFs 5/30-increase metformin to 1000 mg BID 5/30- add 70/30 8 units BID WC Patient's parents are in their 70s and patient unable to manage meds independently.  Would like to avoid long-acting insulin in the home setting Continue SSI  Dyslipidemia Continue statin  History of bipolar 1 disorder/polysubstance abuse/cognitive impairment Continue escitalopram, Neurontin and Depakote-LFTs normal Cleared by psych regarding orientation and capacity Functional cognitive evaluation pending per SLP and Occupational Therapy   Other problems: Acute kidney injury on CKD 3a Acute kidney injury resolved after IV fluids Baseline creatinine 1.3  COPD Continue Singulair, nebs as needed, consider adding LABA  GERD Continue PPI  Hypokalemia/hypomagnesemia Resolved  Data Reviewed: Basic Metabolic Panel: Recent Labs  Lab 02/01/21 0059 02/02/21 1016  NA 133* 134*  K 4.1 3.9  CL 96* 97*  CO2 29 29  GLUCOSE 138* 294*  BUN 31* 17  CREATININE 1.37* 0.99  CALCIUM 8.6* 8.6*   Liver Function Tests: Recent Labs  Lab 02/01/21 0059 02/02/21 1016  AST 39 36  ALT 24 25  ALKPHOS 48 43  BILITOT 0.8 0.4  PROT 5.5* 5.6*  ALBUMIN 2.4* 2.4*   No results for input(s): LIPASE, AMYLASE in the last 168 hours. No results for  input(s): AMMONIA in the last 168 hours. CBC: Recent Labs  Lab 02/01/21 0059 02/02/21 1016  WBC 8.0 5.1  NEUTROABS 2.6 2.4  HGB 12.1 13.0  HCT 36.3 38.4  MCV 87.5 86.5  PLT PLATELET CLUMPS NOTED ON SMEAR, UNABLE TO ESTIMATE 104*   Cardiac Enzymes: No results for input(s): CKTOTAL, CKMB, CKMBINDEX, TROPONINI in the last 168 hours. BNP (last 3 results) Recent Labs    01/14/21 0238 01/15/21 0230 01/16/21 0201  BNP 177.8* 115.9* 40.9    ProBNP (last 3 results) No results for input(s): PROBNP in the last 8760 hours.  CBG: Recent Labs  Lab 02/02/21 0750 02/02/21 1217 02/02/21 1604 02/02/21 2009 02/03/21 0725  GLUCAP 314* 223* 103* 200* 575*    No results found for this or any previous visit (from the past 240 hour(s)).   Studies: MR BRAIN W WO CONTRAST  Result Date: 02/02/2021 CLINICAL DATA:  Mental status change, unknown cause. Patient was admitted with altered mental status, generalized weakness, gait abnormality and falls. EXAM: MRI HEAD WITHOUT AND WITH CONTRAST TECHNIQUE: Multiplanar, multiecho pulse sequences of the brain and surrounding structures were obtained without and with intravenous contrast. CONTRAST:  33mL GADAVIST GADOBUTROL 1 MMOL/ML IV SOLN COMPARISON:  CT head Jan 29, 2021. FINDINGS: Brain: No acute infarction, hemorrhage, extra-axial collection or mass lesion. Similar ventriculomegaly, which is out of proportion to the degree of sulcal volume loss. There is narrowing of the callosal angle (for example series 9, image 18) and crowding of sulci at the  vertex, which can be seen with normal pressure hydrocephalus Periventricular T2/FLAIR hyperintensity could represent transependymal flow of CSF versus chronic microvascular ischemic disease. Additional scattered subcortical T2/FLAIR hyperintensities likely represent mild chronic microvascular ischemic disease. Motion limited postcontrast imaging without obvious abnormal enhancement. Vascular: Small left intradural  vertebral artery flow void, potentially related to non-dominant/congenitally small vertebral artery given large right intradural vertebral artery flow void. Stenosis is not excluded. Otherwise, major arterial flow voids are maintained at the skull base. Skull and upper cervical spine: Normal marrow signal. Sinuses/Orbits: Negative. Other: Small bilateral mastoid effusions. IMPRESSION: 1. Similar ventriculomegaly with findings (detailed above) that are suggestive of normal pressure hydrocephalus, particularly given the patient's reported confusion, weakness and gait abnormality with falls. Central prominent cerebral atrophy with ex vacuo ventricular dilation is a differential consideration. 2. Periventricular T2/FLAIR hyperintensity may represent transependymal flow of CSF given conclusion #1 versus chronic microvascular ischemic disease. 3. Otherwise, no acute abnormality on this motion limited exam. 4. Small bilateral mastoid effusions. Electronically Signed   By: Feliberto Harts MD   On: 02/02/2021 12:34    Scheduled Meds: . aspirin  81 mg Oral Daily  . atorvastatin  20 mg Oral Daily  . cholecalciferol  1,000 Units Oral Daily  . divalproex  500 mg Oral BID  . enoxaparin (LOVENOX) injection  40 mg Subcutaneous Daily  . escitalopram  20 mg Oral Daily  . feeding supplement (GLUCERNA SHAKE)  237 mL Oral TID AC & HS  . gabapentin  300 mg Oral BID  . insulin aspart  0-15 Units Subcutaneous TID WC  . insulin aspart  0-5 Units Subcutaneous QHS  . metFORMIN  850 mg Oral BID WC  . montelukast  10 mg Oral QHS  . pantoprazole  40 mg Oral Daily  . senna-docusate  2 tablet Oral Daily  . thiamine  100 mg Oral Daily   Continuous Infusions: . dextrose 5% lactated ringers 75 mL/hr at 02/03/21 0601    Principal Problem:   Bipolar 1 disorder, depressed, full remission (HCC) Active Problems:   Bipolar 1 disorder (HCC)   Generalized  weakness   Consultants:  Psychiatry  Procedures:  None  Antibiotics: Anti-infectives (From admission, onward)   None       Time spent: 35 minutes    [name] ANP  [facility] 7 am - 330 pm/M-F for direct patient care and secure chat Please refer to Amion for contact info 22  days

## 2021-02-03 NOTE — Progress Notes (Signed)
Neurology Progress Note  Patient ID: Carolyn Norton is a 55 y.o. with PMHx of Bipolar I disorder, anxiety, depression, HTN, HLD, COPD, CKD 3b, uncontrolled DM II, and polysubstance abuse (including cocaine abuse), who initially presented in DKA/HHS in the setting of medication non-compliance but has continued to decline  Initially consulted for: AMS  Subjective: Blood glucose elevated throughout the day with multiple readings > 600 No acute overnight events  Exam: Vitals:   02/02/21 2009 02/03/21 0402  BP: 106/76 (!) 134/54  Pulse: 69 75  Resp: 20 20  Temp: 98.3 F (36.8 C) 97.9 F (36.6 C)  SpO2: 97% 95%   Gen: Sitting up in bed, in no acute distress Resp: non-labored breathing, no respiratory distress on room air Abd: soft, non-tender  Neuro: Mental Status: Awake, not oriented. States that the year is 62, then when given choices she states the year is 2025 with some perseveration on the year. She is unable to provide a clear or coherent history of present illness. Poor attention noted. Able to follow simple one-step commands but does not follow complex or multi-step commands. CN: PERRL, will follow visual stimuli in all fields with coaching, no ptosis noted, smile is symmetric, hearing is intact to voice, palate elevates symmetrically, phonation intact.  Motor: Generalized weakness is present with drift of bilateral upper extremities but examination is attention limited. There is subtle asterixis movements of BUE. BLE weakness with some attempt at antigravity movement but lower extremities are unable to overcome gravity. When assisted to stand, she does not attempt to bear weight on bilateral lower extremities. Significant retropulsion even when sitting up in the bed.  Sensory: Grimaces with light application of noxious stimuli bilaterally.  Gait: Deferred  Pertinent Labs: CBC    Component Value Date/Time   WBC 5.1 02/02/2021 1016   RBC 4.44 02/02/2021 1016   HGB  13.0 02/02/2021 1016   HCT 38.4 02/02/2021 1016   PLT 104 (L) 02/02/2021 1016   MCV 86.5 02/02/2021 1016   MCH 29.3 02/02/2021 1016   MCHC 33.9 02/02/2021 1016   RDW 14.5 02/02/2021 1016   LYMPHSABS 2.1 02/02/2021 1016   MONOABS 0.5 02/02/2021 1016   EOSABS 0.1 02/02/2021 1016   BASOSABS 0.0 02/02/2021 1016   CMP     Component Value Date/Time   NA 134 (L) 02/02/2021 1016   K 3.9 02/02/2021 1016   CL 97 (L) 02/02/2021 1016   CO2 29 02/02/2021 1016   GLUCOSE 294 (H) 02/02/2021 1016   BUN 17 02/02/2021 1016   CREATININE 0.99 02/02/2021 1016   CALCIUM 8.6 (L) 02/02/2021 1016   PROT 5.6 (L) 02/02/2021 1016   ALBUMIN 2.4 (L) 02/02/2021 1016   AST 36 02/02/2021 1016   ALT 25 02/02/2021 1016   ALKPHOS 43 02/02/2021 1016   BILITOT 0.4 02/02/2021 1016   GFRNONAA >60 02/02/2021 1016   GFRAA >60 12/12/2017 0408   Lab Results  Component Value Date   TSH 1.750 01/25/2021   Lab Results  Component Value Date   VITAMINB12 707 01/27/2021   Ammonia: WNL HIV: Non Reactive RPR: Negative  Urinalysis    Component Value Date/Time   COLORURINE YELLOW 01/31/2021 1320   APPEARANCEUR CLEAR 01/31/2021 1320   LABSPEC 1.022 01/31/2021 1320   PHURINE 5.0 01/31/2021 1320   GLUCOSEU >=500 (A) 01/31/2021 1320   HGBUR NEGATIVE 01/31/2021 1320   BILIRUBINUR NEGATIVE 01/31/2021 1320   KETONESUR 5 (A) 01/31/2021 1320   PROTEINUR NEGATIVE 01/31/2021 1320   UROBILINOGEN 0.2  07/20/2014 2022   NITRITE NEGATIVE 01/31/2021 1320   LEUKOCYTESUR NEGATIVE 01/31/2021 1320   Drugs of Abuse     Component Value Date/Time   LABOPIA NONE DETECTED 01/11/2021 1733   COCAINSCRNUR NONE DETECTED 01/11/2021 1733   LABBENZ NONE DETECTED 01/11/2021 1733   AMPHETMU NONE DETECTED 01/11/2021 1733   THCU NONE DETECTED 01/11/2021 1733   LABBARB NONE DETECTED 01/11/2021 1733    Imaging Reviewed:  CT Head without contrast 01/11/21: personally reviewed by attending physician 1. No acute intracranial abnormality. 2.  Similar-appearing prominence of the lateral ventricles may be related to central predominant atrophy, although a component of normal pressure/communicating hydrocephalus cannot be excluded.  CT Head without contrast 01/29/2021: personally reviewed by attending physician No evidence of acute intracranial hemorrhage or acute infarction. Unchanged prominence of the lateral and third ventricles as compared to the head CT of 01/11/2021. This may be secondary to central predominant atrophy, or communicating/normal pressure hydrocephalus in the appropriate clinical setting. Stable generalized parenchymal atrophy and cerebral white matter chronic small vessel ischemic disease.  MRI Brain 02/02/2021 personally reviewed by attending physician: 1. Similar ventriculomegaly with findings (detailed above) that are suggestive of normal pressure hydrocephalus, particularly given the patient's reported confusion, weakness and gait abnormality with falls. Central prominent cerebral atrophy with ex vacuo ventricular dilation is a differential consideration. 2. Periventricular T2/FLAIR hyperintensity may represent transependymal flow of CSF given conclusion #1 versus chronic microvascular ischemic disease. 3. Otherwise, no acute abnormality on this motion limited exam. 4. Small bilateral mastoid effusions.  Routine EEG: "This technically difficult study is suggestive of moderate diffuse encephalopathy, nonspecific etiology. No seizures or epileptiform discharges were seen throughout the recording."  Assessment: 55 yo female who was admitted 21 days ago for multiple issues and has had worsening AMS as days progress. There has been no reversible cause identified thus far.  She does have enlarged ventricles on CT and she does have symptoms concerning for possible NPH.  The degree of atrophy seen on CT, however, is very unusual for someone in their 88s, but with her history of substance abuse this could be the etiology of  her atrophy.  Impression:  1. Encephalopathy, multifactorial.  2. Substance abuse with Cocaine.  3. 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  Recommendations: - Routine EEG to rule out non-convulsive status epilepticus or subclinical seizures playing a role - Continue management of metabolic derangements as you are (blood glucose remains > 600) - Follow-up thiamine level (typically takes 2-4 days to result) and continue empiric supplementation 100 mg daily  - Once blood sugar is better controlled, will plan for large volume LP (30-40 cc removal, with pre-and post- MMSE and gait testing- coordinated with PT/OT (stept to turn 180 degrees measured 2x and averaged, time to walk 10 m, turn 180 degrees and return to starting point, and steps needed to turn 360 degrees measured 3x and averaged). Post-testing will need to be carefully coordinated with PT as it will need to be completed within 30 minutes of drainage to avoid false positives.  Order will be placed to allow ambulation immediately post LP. - CSF studies to include amyloid B1-42, total tau and p-tau (all three are typically low in NPH; while beta1-42 is low and total tau and p-tau are elevated in AD)   Lanae Boast, AGACNP-BC [facility] [phone]  Attending Neurologist's note:  I personally saw this patient, gathering history, performing a neurologic examination, reviewing relevant labs, personally reviewing relevant imaging including MRI brain and formulated the assessment and plan, adding  the note above for completeness and clarity to accurately reflect my thoughts

## 2021-02-03 NOTE — Plan of Care (Signed)
Called by nursing to evaluate patient's left leg shaking.  Nursing reported patient looked quite pale and had an episode of emesis with followed by shaking of the left leg for about 30 seconds that was concerning for seizure.  Activity had resolved by the time I arrived with then recurred briefly with some mild shaking that immediately stopped when I touched the patient's leg.  Nursing did report that it was nonsuppressible on their initial evaluation.  Given the brevity of the event and association with nausea, as well as negative EEG this morning I have a low index of concern that this event represented a seizure.  Nursing also reported there is no gaze deviation or head turn there there was some possible arm involvement.  Patient's examination remained nonfocal for me immediately after the event (though it remains quite limited).  Appreciate primary team's attention to work-up of nausea/emesis which may be related to her extremely elevated blood glucose earlier today (notably was normalizing in the 300s at the time of this event).  Should she have further events, will consider long-term monitoring for spell capture but at this time with a single event and suppressed ability at the time of my evaluation I have a low index of concern for seizure.  Brooke Dare MD-PhD [facility] [phone] Available 7 AM to 7 PM, outside these hours please contact Neurologist on call listed on AMION

## 2021-02-04 LAB — GLUCOSE, CAPILLARY
Glucose-Capillary: 99 mg/dL (ref 70–99)
Glucose-Capillary: 273 mg/dL — ABNORMAL HIGH (ref 70–99)
Glucose-Capillary: 282 mg/dL — ABNORMAL HIGH (ref 70–99)
Glucose-Capillary: 202 mg/dL — ABNORMAL HIGH (ref 70–99)
Glucose-Capillary: 123 mg/dL — ABNORMAL HIGH (ref 70–99)

## 2021-02-04 MED ORDER — INSULIN GLARGINE 100 UNIT/ML ~~LOC~~ SOLN
6.0000 [IU] | Freq: Every day | SUBCUTANEOUS | Status: DC
  Administered 2021-02-04 – 2021-02-06 (×3): 6 [IU] via SUBCUTANEOUS
  Filled 2021-02-04 (×3): qty 0.06

## 2021-02-04 MED ORDER — INSULIN ASPART 100 UNIT/ML IJ SOLN
3.0000 [IU] | Freq: Three times a day (TID) | INTRAMUSCULAR | Status: DC
  Administered 2021-02-04 – 2021-02-06 (×4): 3 [IU] via SUBCUTANEOUS

## 2021-02-04 NOTE — Progress Notes (Signed)
[facility] PROGRESS NOTE  Dally Oshel LGX:[id number] DOB: Sep 22, 1965 DOA: 01/11/2021 PCP: [name], FNP  Status: Remains inpatient appropriate because:Unsafe d/c plan  Dispo:  Patient From:  Home  Planned Disposition: To be determinedHome-family has agreed to take patient back when she is medically stable and able to mobilize independently with minimal assistance.  Medically stable for discharge:    Barriers to DC: Continues to require significant assistance with mobility.             Difficult to place: Yes  Level of care: Med-Surg  Code Status: Full Family Communication: None at bedside. Will call mother once neurology evaluations complete for update. DVT prophylaxis: Lovenox COVID vaccination status: Unknown   HPI: Liseth Wann is a 55 y.o. female with a history of poorly-controlled IDT2DM w/admission for HHS in Dec 2021, bipolar 1 disorder on multidrug regimen, polysubstance abuse with admission Feb 2022 for encephalopathy due to cocaine use, covid-19 infection who presented to the ED from home with her parents on 01/11/2021 with complaints of weakness, falls, worsening confusion seemingly having worsened since use of crack cocaine a month prior. In the ED she appeared severely dehydrated with AKI, hyperglycemia, and encephalopathic.   Her continued deconditioning has caused her to require higher level of care, though the patient initially declined this and was said to have capacity to make her own medical decisions by psychiatry. Her parents refused to take her back, and during hospitalization workup has been negative for reversible causes of encephalopathy which seems to have worsened. Palliative care is consulted.   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 LP is planned with pre- and post- MMSE and gait testing.   Subjective: Pt's mentation fluctuating. She knows herself, but not able to tell me where we are or anything about the last 24 hours.  States "it hurts" when I ask if she's in pain. Then would only answer "here" without any gesture to indicate what/where she means. I moved her right arm closer to her body and she confirmed that was the problem.   Objective: Vitals:   02/04/21 0355 02/04/21 1236  BP: 107/69 117/69  Pulse: 78   Resp: 20 19  Temp: 98 F (36.7 C) 98.5 F (36.9 C)  SpO2: 98% 100%    Intake/Output Summary (Last 24 hours) at 02/04/2021 1534 Last data filed at 02/04/2021 0734 Gross per 24 hour  Intake 1956.47 ml  Output 300 ml  Net 1656.47 ml   Filed Weights   01/11/21 1152  Weight: 99.8 kg    Exam: Gen: 56 y.o. female in no distress Pulm: Nonlabored breathing room air. Clear. CV: Regular rate and rhythm. No murmur, rub, or gallop. No JVD, no pitting dependent edema. GI: Abdomen soft, non-tender, non-distended, with normoactive bowel sounds.  Ext: Warm, no deformities Skin: No new rashes, lesions or ulcers on visualized skin. Neuro: Alert and oriented only to person. Not cooperative with exam. Psych: Judgement and insight appear impaired.   Assessment/Plan: Acute metabolic encephalopathy (multifactorial): Likely cocaine withdrawal, likely also now a component of acute delirium. Gait abnormalities are odd, so CT head repeated still revealing on acute etiology.  - EEG on 5/30 showed no evidence of nonconvulsive status epilepticus or subclinical seizure.  - Based on MRI findings, neurology recommends large volume (30-40cc) LP with pre and post MMSE and gait testing as instructed in their note. Fluid studies should include assays as outlined in neurology note as well.  - HIV and RPR are NR, ammonia wnl,  B12 wnl. Thiamine was normal (117) at previous hospitalization in February, being empirically supplemented here.    Poorly-controlled T2DM with hyperglycemia at admission, also now with intermittent hypoglycemia: HbA1c 11%.  - Continue insulins. Will condense 70/30 into lantus 6 units daily and give 3u  TIDWC + moderate SSI and HS correction.  - Continue metformin. I do not anticipate the patient's blood sugars attaining adequate control without insulin.  History of bipolar 1 disorder, polysubstance abuse: - Continue escitalopram, neurontin and depakote - Previously cleared by psych regarding orientation and capacity, though suspect her exam has changed from that time.   Acute kidney injury on CKD 3a: Acute kidney injury resolved after IV fluids, though has resumed with poor per oral intake. - IVF restarted 02/08/23, will recheck metabolic panel in AM   COPD: No exacerbation - Continue singulair, nebs as needed.  GERD - Continue PPI  Dyslipidemia - Continue statin  Hypokalemia/hypomagnesemia - Resolved  Thrombocytopenia:  - Currently resolved.  Data Reviewed: Basic Metabolic Panel: Recent Labs  Lab 02/01/21 0059 02/02/21 1016  NA 133* 134*  K 4.1 3.9  CL 96* 97*  CO2 29 29  GLUCOSE 138* 294*  BUN 31* 17  CREATININE 1.37* 0.99  CALCIUM 8.6* 8.6*   Liver Function Tests: Recent Labs  Lab 02/01/21 0059 02/02/21 1016  AST 39 36  ALT 24 25  ALKPHOS 48 43  BILITOT 0.8 0.4  PROT 5.5* 5.6*  ALBUMIN 2.4* 2.4*   No results for input(s): LIPASE, AMYLASE in the last 168 hours. No results for input(s): AMMONIA in the last 168 hours. CBC: Recent Labs  Lab 02/01/21 0059 02/02/21 1016  WBC 8.0 5.1  NEUTROABS 2.6 2.4  HGB 12.1 13.0  HCT 36.3 38.4  MCV 87.5 86.5  PLT PLATELET CLUMPS NOTED ON SMEAR, UNABLE TO ESTIMATE 104*   Cardiac Enzymes: No results for input(s): CKTOTAL, CKMB, CKMBINDEX, TROPONINI in the last 168 hours. BNP (last 3 results) Recent Labs    01/14/21 0238 01/15/21 0230 01/16/21 0201  BNP 177.8* 115.9* 40.9    ProBNP (last 3 results) No results for input(s): PROBNP in the last 8760 hours.  CBG: Recent Labs  Lab 02-07-2021 1932 02/07/21 2216 02/04/21 0354 02/04/21 0736 02/04/21 1216  GLUCAP 156* 112* 99 273* 282*    No results found  for this or any previous visit (from the past 240 hour(s)).   Studies: EEG adult  Result Date: 02/07/21 Charlsie Quest, MD     02-07-2021  9:34 AM Patient Name: Tanicka Bisaillon MRN: [id number] Epilepsy Attending: Charlsie Quest Referring Physician/Provider: DR Brooke Dare Date: Feb 07, 2021 Duration: 31.58 mins Patient history: 55 yo female who was admitted 21 days ago for multiple issues and has had worsening AMS as days progress. EEG to evaluate for seizure Level of alertness: Awake AEDs during EEG study: VPA, GBP Technical aspects: This EEG study was done with scalp electrodes positioned according to the 10-20 International system of electrode placement. Electrical activity was acquired at a sampling rate of 500Hz  and reviewed with a high frequency filter of 70Hz  and a low frequency filter of 1Hz . EEG data were recorded continuously and digitally stored. Description: No posterior dominant rhythm was seen. EEG showed continuous generalized 3 to 7 Hz theta-delta slowing.  Hyperventilation and photic stimulation were not performed.   Of note, study was technically difficult due to significant movement and sweat artifact. ABNORMALITY - Continuous slow, generalized IMPRESSION: This technically difficult study is suggestive of moderate diffuse encephalopathy, nonspecific etiology.  No seizures or epileptiform discharges were seen throughout the recording. Priyanka Annabelle Harman    Scheduled Meds: . aspirin  81 mg Oral Daily  . atorvastatin  20 mg Oral Daily  . cholecalciferol  1,000 Units Oral Daily  . divalproex  500 mg Oral BID  . enoxaparin (LOVENOX) injection  40 mg Subcutaneous Daily  . escitalopram  20 mg Oral Daily  . feeding supplement (GLUCERNA SHAKE)  237 mL Oral TID AC & HS  . gabapentin  300 mg Oral BID  . insulin aspart  0-15 Units Subcutaneous TID WC  . insulin aspart  0-5 Units Subcutaneous QHS  . insulin aspart protamine- aspart  8 Units Subcutaneous BID WC  . metFORMIN  1,000 mg  Oral BID WC  . montelukast  10 mg Oral QHS  . pantoprazole  40 mg Oral Daily  . senna-docusate  2 tablet Oral Daily  . thiamine  100 mg Oral Daily   Continuous Infusions: . sodium chloride 100 mL/hr at 02/04/21 0806    Principal Problem:   Bipolar 1 disorder, depressed, full remission (HCC) Active Problems:   Bipolar 1 disorder (HCC)   Generalized weakness   Consultants:  Psychiatry  Procedures:  None  Antibiotics: Anti-infectives (From admission, onward)   None       Time spent: 35 minutes  Tyrone Nine, MD  [facility] Please refer to [facility] for contact info 23  days

## 2021-02-04 NOTE — Progress Notes (Addendum)
Palliative Medicine Inpatient Follow Up Note  Reason for consult:  Goals of Care "Patient with bipolar and substance abuse.  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  She is not eating. Current plan if unable to get placed would be to go home with elderly parents."  HPI:  Per intake H&P --> 55 year old female past medical history of diabetes mellitus type 2, bipolar disorder type I, previous COVID infection, polysubstance abuse that includes cocaine.  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  She is not eating or drinking sufficiently. Palliative care has been asked to get involved to further discuss goals of care in the setting of progressive failure to thrive.  Today's Discussion (02/04/2021):  *Please note that this is a verbal dictation therefore any spelling or grammatical errors are due to the "[place]" system interpretation.   I met at bedside this this morning with Carolyn Norton.  There was no family at bedside.  Carolyn Norton was alert and oriented to person.  She remains to fluctuate with her knowledge of where she is and why she is here.  I attempted to get Carolyn Norton up this morning to get a better idea of her gait and mobility.  She was able to appropriately sit at the side of the bed though when asked to get up she was unable to coordinate putting her feet on the ground and getting to a standing position.  She remained at the side of the bed for about 10 minutes.  We were unable to advance beyond this.  Per chart review the plan at this time will be to optimize blood glucose levels and plan for a large-volume lumbar puncture once optimized.  From a social perspective again patient will require long-term placement due to patient's parents inability to provide appropriate care for her.  Ongoing care and support will be provided by the palliative care team in the oncoming  days.  Objective Assessment: Vital Signs Vitals:   02/03/21 1936 02/04/21 0355  BP: (!) 99/43 107/69  Pulse: 79 78  Resp: 16 20  Temp: 98.1 F (36.7 C) 98 F (36.7 C)  SpO2: 96% 98%    Intake/Output Summary (Last 24 hours) at 02/04/2021 0856 Last data filed at 02/04/2021 0630 Gross per 24 hour  Intake 2351.67 ml  Output 600 ml  Net 1751.67 ml   Last Weight  Most recent update: 01/11/2021 11:52 AM   Weight  99.8 kg (220 lb)           Gen:  Caucasian F in NAD HEENT: moist mucous membranes CV: Regular rate and rhythm  PULM: clear to auscultation bilaterally  ABD: soft/nontender  EXT: No edema  Neuro: Awake, alert  SUMMARY OF RECOMMENDATIONS DNAR/DNI, No G-Tube  Will place gold DNR on the chart  Goals: Improve present condition. Per patients mother long term placement - she realizes that Carolyn Norton may continue to decline and if this occurs additional [place] conversations will be needed  Ongoing incremental palliative care support  Time Spent: 35 Greater than 50% of the time was spent in counseling and coordination of care ______________________________________________________________________________________ [place] Team Team Cell Phone: [phone] Please utilize secure chat with additional questions, if there is no response within 30 minutes please call the above phone number  Palliative Medicine Team providers are available by phone from 7am to 7pm daily and can be reached through the team cell phone.  Should this patient require assistance outside of these hours,  please call the patient's attending physician.

## 2021-02-05 ENCOUNTER — Inpatient Hospital Stay (HOSPITAL_COMMUNITY): Payer: Medicaid Other

## 2021-02-05 LAB — GLUCOSE, CAPILLARY
Glucose-Capillary: 289 mg/dL — ABNORMAL HIGH (ref 70–99)
Glucose-Capillary: 233 mg/dL — ABNORMAL HIGH (ref 70–99)
Glucose-Capillary: 160 mg/dL — ABNORMAL HIGH (ref 70–99)
Glucose-Capillary: 51 mg/dL — ABNORMAL LOW (ref 70–99)

## 2021-02-05 LAB — GLUCOSE, CSF: Glucose, CSF: 132 mg/dL — ABNORMAL HIGH (ref 40–70)

## 2021-02-05 LAB — PROTEIN, CSF: Total  Protein, CSF: 44 mg/dL (ref 15–45)

## 2021-02-05 MED ORDER — FAMOTIDINE 20 MG PO TABS: 20.0000 mg | ORAL_TABLET | Freq: Two times a day (BID) | ORAL | Status: DC | PRN

## 2021-02-05 MED ORDER — LORAZEPAM 2 MG/ML IJ SOLN
1.0000 mg | INTRAMUSCULAR | Status: AC | PRN
  Administered 2021-02-06: 1 mg via INTRAVENOUS
  Filled 2021-02-05 (×2): qty 1

## 2021-02-05 MED ORDER — LIDOCAINE HCL (PF) 1 % IJ SOLN
5.0000 mL | Freq: Once | INTRAMUSCULAR | Status: AC
  Administered 2021-02-05: 10 mL via INTRADERMAL

## 2021-02-05 MED ORDER — LIDOCAINE HCL (PF) 1 % IJ SOLN
10.0000 mL | Freq: Once | INTRAMUSCULAR | Status: AC
  Administered 2021-02-05: 10 mL via INTRADERMAL

## 2021-02-05 NOTE — Plan of Care (Signed)
  Problem: Nutrition: Goal: Adequate nutrition will be maintained Outcome: Progressing   Problem: Elimination: Goal: Will not experience complications related to bowel motility Outcome: Progressing   Problem: Pain Managment: Goal: General experience of comfort will improve Outcome: Progressing   

## 2021-02-05 NOTE — Progress Notes (Signed)
Patient refused for labs to be drawn, she became verbally aggressive, lab tech will attempt again later.

## 2021-02-05 NOTE — TOC Progression Note (Signed)
Transition of Care [facility]) - Progression Note    Patient Details  Name: Riham Polyakov MRN: [id number] Date of Birth: 04-21-1966  Transition of [facility]) CM/SW [place], RN Phone Number: 02/05/2021, 1:09 PM  Clinical Narrative:    CM met with the patient at the bedside for transitions of care.  The patient was oriented to self only and has developed declining orientation, cognition and mobility since last visit.  The patient is not safe to discharge to care of her mother and father at home and may need SNF placement.  Message sent to [place], [place] at [place] to explore options for SNF admission under coverage through [place] while Medicaid / Disability are being determined.  CM and MSW will continue to follow for SNF placement.   Expected Discharge Plan: Skilled Nursing Facility Barriers to Discharge: Family Issues,Continued Medical Work up  Expected Discharge Plan and Services Expected Discharge Plan: [place] In-house Referral: Clinical Social Work Discharge Planning Services: CM Consult Post Acute Care Choice: [place] arrangements for the past 2 months: Single Family Home Expected Discharge Date: 01/16/21               DME Arranged: 3-N-1,[place] rolling DME Agency: [employer] Date DME Agency Contacted: 01/20/21 Time DME Agency Contacted: [id number] Representative spoke with at DME Agency: [place] Determinants of Health ([place]) Interventions    Readmission Risk Interventions Readmission Risk Prevention Plan 01/14/2021  Transportation Screening Complete  PCP or Specialist Appt within 3-5 Days (No Data)  Social Work Consult for [place] Planning/Counseling [place] Not Applicable  Medication Review Press photographer) Referral to Pharmacy  Some recent data might be hidden

## 2021-02-05 NOTE — Progress Notes (Signed)
Post LP progress note.  Postponement of post LP assessment by PT.  Call from radiology stated LP x2 produced only 9 cc's of CSF.  Per D.r Elisha Headland, we will postpone the post LP assessment and possibly re-schedule for Thursday or Friday.    Please see Pre LP assessment for Mass Effect of NPH on function which will be transferred into this chart by 6/2 and to be available for comparison when the follow up LP is completed.  Charge:  Eval moderate and 1 Therapeutic Activity  02/05/2021  Carolyn Norton., PT Acute Rehabilitation Services [phone]  (pager) [phone]  (office)

## 2021-02-05 NOTE — Progress Notes (Signed)
[facility] PROGRESS NOTE  Carolyn Norton [id number]:[id number] DOB: 1965/12/03 DOA: 01/11/2021 PCP: [name], FNP  Status: Remains inpatient appropriate because:Unsafe d/c plan  Dispo:  Patient From:  Home  Planned Disposition: To be determinedHome-family has agreed to take patient back when she is medically stable and able to mobilize independently with minimal assistance.  Medically stable for discharge:  NO  Barriers to DC: Continues to require significant assistance with mobility.             Difficult to place: Yes  Level of care: Med-Surg  Code Status: Full Family Communication: 5/30 father-regarding possible NPH diagnosis DVT prophylaxis: Lovenox COVID vaccination status: Unknown   HPI: 55 year old female past medical history of diabetes mellitus type 2, bipolar disorder type I, previous COVID infection, polysubstance abuse that includes cocaine.  She presented from home where she lives with her parents.  Her presenting symptoms included generalized weakness, multiple falls and confusion.  Family confirmed that she had been having the symptoms for at least a month with last use of crack cocaine 1 month prior to presentation.  Ever since that time she had not been doing well.  She would also be having issues with hyperglycemia.  In the ER she was diagnosed with severe dehydration, hyperglycemia and metabolic encephalopathy.  Since admission patient has been evaluated by the psychiatric team who has deemed her alert and oriented and having capacity to make decisions.  Patient is agreeable to placement at a rehab.  Currently she has severe physical deconditioning which was POA and PT is recommending SNF.  Because of her history of polysubstance abuse and lack of funding SNF is not an option.  Family is agreeable to taking her back in home when she is able to ambulate well enough to get back and forth for meals into the bathroom.  Subjective: Awake, alert, pleasant.  No  specific complaints.  Objective: Vitals:   02/04/21 2119 02/05/21 0605  BP: 119/60 130/67  Pulse: 77 67  Resp: 16 14  Temp: 98.2 F (36.8 C) 97.8 F (36.6 C)  SpO2: 98% 97%    Intake/Output Summary (Last 24 hours) at 02/05/2021 0806 Last data filed at 02/05/2021 0541 Gross per 24 hour  Intake 2788.78 ml  Output 550 ml  Net 2238.78 ml   Filed Weights   01/11/21 1152  Weight: 99.8 kg    Exam:  Constitutional: Alert and in no acute distress.  Appears to be comfortable Respiratory: Anterior lung sounds are clear.  Stable on room air. Cardiac: Heart sounds, regular pulse, no JVD.  No peripheral edema. Abdomen: LBM 5/30, soft nontender nondistended Neurologic: CN 2-12 grossly intact. Sensation intact, Strength 4/5 x all 4 extremities.  Continues to have significant ambulatory dysfunction and gait disturbance  Psychiatric: Alert and oriented times name only.  Not oriented to place despite being given multiple options to choose from.  Even when I stated are you in the hospital she stated no.  When asked what the year is she stated 2099.  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-anticipate will undergo LP w CSF studies and timed gait and MMSE testing  Patient was to undergo this procedure on 5/31 but due to her anatomy neuro unable to proceed and has asked IR to coordinate with PT hopefully today on 6/1  Dental pain Reported to staff nurse several days ago she was  having a tooth ache on the right upper jaw Recent MRI without mention of dental caries or abscesses  Anorexia/constipation Likely related to underlying neurocognitive issues Check urinalysis and culture to rule out possible UTI as etiology Check KUB with moderate colonic stool burden We will give 1 dose of milk of magnesia and monitor for result NS initiated 5/27-given persistent poor intake over the WE changed to D5LR but unfortunately developed hyperglycemia so fluids changed back to NS  Diabetes mellitus 2 with hyperglycemia on insulin Hemoglobin A1c 11.0 CBGS elevated-will dc dextrose containing IVFs 5/30-increase metformin to 1000 mg BID 26-Feb-2023- add 70/30 8 units BID WC Patient's parents are in their 70s and patient unable to manage meds independently.  Would like to avoid long-acting insulin in the home setting Continue SSI  Dyslipidemia Continue statin  History of bipolar 1 disorder/polysubstance abuse/cognitive impairment Continue escitalopram, Neurontin and Depakote-LFTs normal Cleared by psych regarding orientation and capacity Functional cognitive evaluation pending per SLP and Occupational Therapy   Other problems: Acute kidney injury on CKD 3a Acute kidney injury resolved after IV fluids Baseline creatinine 1.3  COPD Continue Singulair, nebs as needed, consider adding LABA  GERD Continue PPI  Hypokalemia/hypomagnesemia Resolved  Data Reviewed: Basic Metabolic Panel: Recent Labs  Lab 02/01/21 0059 02/02/21 1016  NA 133* 134*  K 4.1 3.9  CL 96* 97*  CO2 29 29  GLUCOSE 138* 294*  BUN 31* 17  CREATININE 1.37* 0.99  CALCIUM 8.6* 8.6*   Liver Function Tests: Recent Labs  Lab 02/01/21 0059 02/02/21 1016  AST 39 36  ALT 24 25  ALKPHOS 48 43  BILITOT 0.8 0.4  PROT 5.5* 5.6*  ALBUMIN 2.4* 2.4*   No results for input(s): LIPASE, AMYLASE in the last 168 hours. No results for input(s): AMMONIA in the last 168 hours. CBC: Recent Labs  Lab  02/01/21 0059 02/02/21 1016  WBC 8.0 5.1  NEUTROABS 2.6 2.4  HGB 12.1 13.0  HCT 36.3 38.4  MCV 87.5 86.5  PLT PLATELET CLUMPS NOTED ON SMEAR, UNABLE TO ESTIMATE 104*   Cardiac Enzymes: No results for input(s): CKTOTAL, CKMB, CKMBINDEX, TROPONINI in the last 168 hours. BNP (last 3 results) Recent Labs    01/14/21 0238 01/15/21 0230 01/16/21 0201  BNP 177.8* 115.9* 40.9    ProBNP (last 3 results) No results for input(s): PROBNP in the last 8760 hours.  CBG: Recent Labs  Lab 02/04/21 0354 02/04/21 0736 02/04/21 1216 02/04/21 1612 02/04/21 2121  GLUCAP 99 273* 282* 202* 123*    No results found for this or any previous visit (from the past 240 hour(s)).   Studies: EEG adult  Result Date: 2021-02-25 Charlsie Quest, MD     February 25, 2021  9:34 AM Patient Name: Carolyn Norton MRN: [id number] Epilepsy Attending: Charlsie Quest Referring Physician/Provider: DR Brooke Dare Date: 02/25/2021 Duration: 31.58 mins Patient history: 55 yo female who was admitted 21 days ago for multiple issues and has had worsening AMS as days progress. EEG to evaluate for seizure Level of alertness: Awake AEDs during EEG study: VPA, GBP Technical aspects: This EEG study was done with scalp electrodes positioned according to the 10-20 International system of electrode placement. Electrical activity was acquired at a sampling rate of 500Hz  and  reviewed with a high frequency filter of 70Hz  and a low frequency filter of 1Hz . EEG data were recorded continuously and digitally stored. Description: No posterior dominant rhythm was seen. EEG showed continuous generalized 3 to 7 Hz theta-delta slowing.  Hyperventilation and photic stimulation were not performed.   Of note, study was technically difficult due to significant movement and sweat artifact. ABNORMALITY - Continuous slow, generalized IMPRESSION: This technically difficult study is suggestive of moderate diffuse encephalopathy, nonspecific etiology. No  seizures or epileptiform discharges were seen throughout the recording. Priyanka    Scheduled Meds: . aspirin  81 mg Oral Daily  . atorvastatin  20 mg Oral Daily  . cholecalciferol  1,000 Units Oral Daily  . divalproex  500 mg Oral BID  . enoxaparin (LOVENOX) injection  40 mg Subcutaneous Daily  . escitalopram  20 mg Oral Daily  . feeding supplement (GLUCERNA SHAKE)  237 mL Oral TID AC & HS  . gabapentin  300 mg Oral BID  . insulin aspart  0-15 Units Subcutaneous TID WC  . insulin aspart  0-5 Units Subcutaneous QHS  . insulin aspart  3 Units Subcutaneous TID WC  . insulin glargine  6 Units Subcutaneous Daily  . metFORMIN  1,000 mg Oral BID WC  . montelukast  10 mg Oral QHS  . pantoprazole  40 mg Oral Daily  . senna-docusate  2 tablet Oral Daily  . thiamine  100 mg Oral Daily   Continuous Infusions: . sodium chloride 100 mL/hr at 02/04/21 2016    Principal Problem:   Bipolar 1 disorder, depressed, full remission (HCC) Active Problems:   Bipolar 1 disorder (HCC)   Generalized weakness   Consultants:  Psychiatry  Procedures:  None  Antibiotics: Anti-infectives (From admission, onward)   None       Time spent: 35 minutes    02/06/21 ANP  [facility] 7 am - 330 pm/M-F for direct patient care and secure chat Please refer to [facility] for contact info 24  days

## 2021-02-05 NOTE — Procedures (Signed)
L3-4, L2-3 were accessed with a 20 gauge needle without difficulty.  Blood tinged at both levels that started to clear but with very limited csf flow at both levels. Head of bed elevation and valsalva made little impact.  Clear tidalling of csf with valsalva and respiration was noted. See dedicated report for further detail.  The patient tolerated the procedure well without signs of complication.  Referring physician was notified.

## 2021-02-05 NOTE — Progress Notes (Signed)
PROGRESS NOTE  Pre LP assessment completed, but limited.   Will return for post LP assessment 30 min -1 hour post completion of the LP.  My beeper [phone]  [place] [facility] PT

## 2021-02-05 NOTE — Progress Notes (Signed)
Occupational Therapy Treatment Patient Details Name: Carolyn Norton MRN: [id number] DOB: September 19, 1965 Today's Date: 02/05/2021    History of present illness Pt presents from home on 01/13/21 with multiple falls, confusion, and weakness. In ED blood glucose was 729 and pt was unable to ambulate.  Patient admitted with severe dehydration and metabolic encephalopathy. PMH: DM2, COPD, CKD3, GERD, bipolar disorder type 1, previous covid 19 infection, polysubstance abuse including cocaine.   OT comments  Patient met lying supine in bed. Noted further decreased cognition with patient requiring max multimodal cues to follow simple 1-step verbal commands and sequence familiar ADL tasks. Patient also unable to name 3 ADL items (toothbrush, toothpaste, and sock). Session with focus on sitting balance as patient demonstrates need for up to Mod-Max A to maintain static sitting balance at times with slow posterior LOB. Patient to progress to recliner with Max A and RW initially progressing to Total assist for safe descent to recliner. Patient seemingly unaware. Patient would benefit from continued acute OT services in prep for safe d/c to next level of care. Goals downgrade to reflect slow progress toward goals.    Follow Up Recommendations  SNF;Supervision/Assistance - 24 hour    Equipment Recommendations  3 in 1 bedside commode    Recommendations for Other Services      Precautions / Restrictions Precautions Precautions: Fall Restrictions Weight Bearing Restrictions: No       Mobility Bed Mobility Overal bed mobility: Needs Assistance Bed Mobility: Supine to Sit Rolling: Min assist Sidelying to sit: Max assist       General bed mobility comments: Requires max multimodal cues for sidelying to sit. Requires more than increased time even with verbal/tactile cues. Patient easily distracted.    Transfers Overall transfer level: Needs assistance Equipment used: Rolling walker (2  wheeled) Transfers: Sit to/from [facility] Sit to Stand: Mod assist Stand pivot transfers: Max assist;Total assist       General transfer comment: Mod A for sit to stand from EOB with max multimodal cues. Patient requires Max A initially progressing to Total A for stand-pivot to recliner this date.    Balance Overall balance assessment: Needs assistance;History of Falls Sitting-balance support: Feet supported;Bilateral upper extremity supported Sitting balance-Leahy Scale: Poor Sitting balance - Comments: Requires intermittent Mod to Max A to maintain static sitting balance. Min A at best this date. Postural control: Posterior lean Standing balance support: During functional activity;Bilateral upper extremity supported Standing balance-Leahy Scale: Poor Standing balance comment: Reliant on BUE on RW and external assist.                           ADL either performed or assessed with clinical judgement   ADL       Grooming: Wash/dry face;Bed level;Moderate assistance Grooming Details (indicate cue type and reason): Requires Max multimodal cues for sequencing, initiation and termination.                 Toilet Transfer: Maximal assistance;Cueing for sequencing;Cueing for safety;Stand-pivot Toilet Transfer Details (indicate cue type and reason): Simulated with stand-pivot transfer to recliner with use of RW and Max multimodal cues. Strong posterior lean with difficulty weight-shifting. Total A to sit safely in recliner.           General ADL Comments: Patient seems to have functional decline compared to previous sessions. Requiring Max multimodal cues to sequence familiar ADL tasks including grooming in sitting. Patient with decreased static sitting balance at  EOB requiring Max multimodal cues to remain upright. Extensive time spent working on sitting balance at EOB in prep for functional transfers.     Vision       Perception     Praxis       Cognition Arousal/Alertness: Awake/alert Behavior During Therapy: WFL for tasks assessed/performed Overall Cognitive Status: Impaired/Different from baseline Area of Impairment: Orientation;Attention;Memory;Following commands;Safety/judgement;Awareness;Problem solving                 Orientation Level: Disoriented to;Place;Time;Situation (Able to state city and state.) Current Attention Level: Sustained Memory: Decreased short-term memory Following Commands: Follows one step commands inconsistently;Follows one step commands with increased time Safety/Judgement: Decreased awareness of safety;Decreased awareness of deficits Awareness: Intellectual Problem Solving: Slow processing;Difficulty sequencing;Requires verbal cues;Decreased initiation;Requires tactile cues General Comments: Patient with deficits in memory, processing verbal information, attention to task and command following requiring Max multimodal cues to complete familiar ADL tasks including combing hair and washing face. Patient unable to name familiar ADL items even after visual and verbal cues. Noted immediate recall with increased time and max cues but able to recall 0/3 names of ADL items after re-education.        Exercises Exercises: Other exercises Other Exercises Other Exercises: Sitting balance with reaching outside of BOS in prep for functional transfers.   Shoulder Instructions       General Comments Patient appears to have had a functional decline since last treatment session requiring up to Total A for functional transfers and decreased ability to attend to familiar tasks or follow simple 1-step verbal commands.    Pertinent Vitals/ Pain       Pain Assessment: No/denies pain Pain Intervention(s): Monitored during session  Home Living                                          Prior Functioning/Environment              Frequency  Min 2X/week        Progress Toward  Goals  OT Goals(current goals can now be found in the care plan section)  Progress towards OT goals: Not progressing toward goals - comment (Please refer to general comments section.)  Acute Rehab OT Goals Patient Stated Goal: To go home. OT Goal Formulation: Patient unable to participate in goal setting Time For Goal Achievement: 02/21/21 Potential to Achieve Goals: Fair ADL Goals Pt Will Perform Grooming: sitting;with min assist Pt Will Perform Upper Body Dressing: with min assist;sitting Pt Will Perform Lower Body Dressing: with mod assist;sitting/lateral leans;sit to/from stand Pt Will Transfer to Toilet: with mod assist;bedside commode Pt Will Perform Toileting - Clothing Manipulation and hygiene: with mod assist;sitting/lateral leans;sit to/from stand Pt/caregiver will Perform Home Exercise Program: Increased strength;Both right and left upper extremity;With written HEP provided;With minimal assist Additional ADL Goal #1: Patient will follow 1-step verbal commands with 75% accuracy.  Plan Frequency remains appropriate;Discharge plan remains appropriate;Other (comment) (Goals downgraded to reflect slow progress toward goals.)    Co-evaluation                 AM-PAC OT "6 Clicks" Daily Activity     Outcome Measure   Help from another person eating meals?: None Help from another person taking care of personal grooming?: A Lot Help from another person toileting, which includes using toliet, bedpan, or urinal?: Total Help from another person bathing (including  washing, rinsing, drying)?: Total Help from another person to put on and taking off regular upper body clothing?: A Lot Help from another person to put on and taking off regular lower body clothing?: Total 6 Click Score: 11    End of Session Equipment Utilized During Treatment: Gait belt;Rolling walker  OT Visit Diagnosis: Unsteadiness on feet (R26.81);Other abnormalities of gait and mobility (R26.89);Muscle  weakness (generalized) (M62.81);Other symptoms and signs involving cognitive function;Other symptoms and signs involving the nervous system (R29.898)   Activity Tolerance Other (comment) (Session again limited by decreased command following and increased need for external assist.)   Patient Left in chair;with call bell/phone within reach;with chair alarm set   Nurse Communication Mobility status (Stedy +2)        Time: 7005-2591 OT Time Calculation (min): 34 min  Charges: OT General Charges $OT Visit: 1 Visit OT Treatments $Self Care/Home Management : 8-22 mins $Therapeutic Activity: 8-22 mins  [place] Cost H. OTR/L Supplemental OT, Department of rehab services [phone]   Chloeann Alfred R H. 02/05/2021, 9:16 AM

## 2021-02-05 NOTE — Progress Notes (Signed)
NEUROLOGY PROGRESS NOTE  Subjective: Refused labs overnight, was verbally aggressive  Exam: Current vital signs: BP 130/67 (BP Location: Left Arm)   Pulse 67   Temp 97.8 F (36.6 C)   Resp 14   Ht 5\' 6"  (1.676 m)   Wt 99.8 kg   LMP 01/26/2017 (Approximate)   SpO2 97%   BMI 35.51 kg/m  Vital signs in last 24 hours: Temp:  [97.8 F (36.6 C)-98.5 F (36.9 C)] 97.8 F (36.6 C) (06/01 0605) Pulse Rate:  [67-77] 67 (06/01 0605) Resp:  [14-19] 14 (06/01 0605) BP: (117-130)/(60-69) 130/67 (06/01 0605) SpO2:  [97 %-100 %] 97 % (06/01 05-21-1988)   Physical Exam  Constitutional: Appears well-developed and well-nourished.  Psych: Affect appropriate to situation, smiling and cooperative Eyes: No scleral injection HENT: No OP obstruction Head: Normocephalic.  Cardiovascular: Normal rate and regular rhythm.  Respiratory: Effort normal, non-labored breathing GI: Soft.  No distension. There is no tenderness.  Skin: Warm dry and intact visible skin Musculoskeletal: Lumbar spinous processes difficult to appreciate.  Appears to have mild scoliosis as well.   Neuro:  Mental Status: Alert, oriented to self but reports it is 2020 and reports the month as "May.JuneMarland KitchenJune", oriented to hospital by choices, and then states she does not know which hospital she is in.  When she is asked how old she has she tries to repeat the date telling me it is February 25, 2021.  Slow and mildly dysarthric speech, repeats "today is a funny day "instead of "today is a sunny day."  Able to name simple objects Cranial Nerves: II:  Visual fields grossly normal, pupils equal round and reactive to light for 2 mm III,IV, VI: ptosis not present, extra-ocular motions intact bilaterally  V,VII: smile symmetric, at times mild right nasolabial fold flattening but symmetric activation, facial light touch sensation normal bilaterally VIII: hearing normal bilaterally Motor: No pronator drift bilaterally.  She does have some mild  asterixis Tone and bulk:normal tone throughout; no atrophy noted Sensory: Pinprick and light touch intact throughout, bilaterally Cerebellar: Slow finger-to-nose, mild dysmetria at end range bilaterally symmetric     Medications:  Current Facility-Administered Medications:  .  0.9 %  sodium chloride infusion, , Intravenous, Continuous, February 27, 2021, NP, Last Rate: 100 mL/hr at 02/04/21 2016, New Bag at 02/04/21 2016 .  acetaminophen (TYLENOL) tablet 650 mg, 650 mg, Oral, Q6H PRN, 2017, DO, 650 mg at 02/03/21 1945 .  albuterol (PROVENTIL) (2.5 MG/3ML) 0.083% nebulizer solution 3 mL, 3 mL, Nebulization, Q6H PRN, Hall, Carole N, DO .  aspirin chewable tablet 81 mg, 81 mg, Oral, Daily, 02/05/21, MD, 81 mg at 02/04/21 0803 .  atorvastatin (LIPITOR) tablet 20 mg, 20 mg, Oral, Daily, 02/06/21 N, DO, 20 mg at 02/04/21 02/06/21 .  bisacodyl (DULCOLAX) suppository 10 mg, 10 mg, Rectal, Daily PRN, 3474, MD .  cholecalciferol (VITAMIN D3) tablet 1,000 Units, 1,000 Units, Oral, Daily, Tyrone Nine, NP, 1,000 Units at 02/04/21 0804 .  dextrose 50 % solution 0-50 mL, 0-50 mL, Intravenous, PRN, 08-19-1973, MD, 50 mL at 01/26/21 1722 .  divalproex (DEPAKOTE) DR tablet 500 mg, 500 mg, Oral, BID, 01/28/21, MD, 500 mg at 02/04/21 2119 .  enoxaparin (LOVENOX) injection 40 mg, 40 mg, Subcutaneous, Daily, 2120 N, DO, 40 mg at 02/04/21 0806 .  escitalopram (LEXAPRO) tablet 20 mg, 20 mg, Oral, Daily, Hall, Carole N, DO, 20 mg at 02/04/21 0807 .  feeding  supplement (GLUCERNA SHAKE) (GLUCERNA SHAKE) liquid 237 mL, 237 mL, Oral, TID AC & HS, Russella Dar, NP, 237 mL at 02/04/21 2120 .  gabapentin (NEURONTIN) capsule 300 mg, 300 mg, Oral, BID, Leroy Sea, MD, 300 mg at 02/04/21 2119 .  insulin aspart (novoLOG) injection 0-15 Units, 0-15 Units, Subcutaneous, TID WC, Tyrone Nine, MD, 5 Units at 02/04/21 1745 .  insulin aspart (novoLOG) injection 0-5  Units, 0-5 Units, Subcutaneous, QHS, Grunz, Ryan B, MD .  insulin aspart (novoLOG) injection 3 Units, 3 Units, Subcutaneous, TID WC, Tyrone Nine, MD, 3 Units at 02/04/21 1746 .  insulin glargine (LANTUS) injection 6 Units, 6 Units, Subcutaneous, Daily, Tyrone Nine, MD, 6 Units at 02/04/21 1746 .  LORazepam (ATIVAN) injection 1 mg, 1 mg, Intravenous, PRN, Jerald Kief, MD .  melatonin tablet 3 mg, 3 mg, Oral, QHS PRN, Darlin Drop, DO, 3 mg at 02/02/21 2051 .  metFORMIN (GLUCOPHAGE) tablet 1,000 mg, 1,000 mg, Oral, BID WC, Russella Dar, NP, 1,000 mg at 02/04/21 1746 .  montelukast (SINGULAIR) tablet 10 mg, 10 mg, Oral, QHS, Hall, Carole N, DO, 10 mg at 02/04/21 2119 .  ondansetron (ZOFRAN) injection 4 mg, 4 mg, Intravenous, Q6H PRN, Dow Adolph N, DO, 4 mg at 02/03/21 1946 .  pantoprazole (PROTONIX) EC tablet 40 mg, 40 mg, Oral, Daily, Dow Adolph N, DO, 40 mg at 02/04/21 0804 .  senna-docusate (Senokot-S) tablet 2 tablet, 2 tablet, Oral, Daily, Tyrone Nine, MD, 2 tablet at 02/04/21 0804 .  thiamine tablet 100 mg, 100 mg, Oral, Daily, Kirby-Graham, Beather Arbour, NP, 100 mg at 02/04/21 0804   Pertinent Labs and Imaging  CT Head without contrast 01/11/21: personally reviewed by attending physician 1. No acute intracranial abnormality. 2. Similar-appearing prominence of the lateral ventricles may be related to central predominant atrophy, although a component of normal pressure/communicating hydrocephalus cannot be excluded.  CT Head without contrast 01/29/2021: personally reviewed by attending physician No evidence of acute intracranial hemorrhage or acute infarction. Unchanged prominence of the lateral and third ventricles as compared to the head CT of 01/11/2021. This may be secondary to central predominant atrophy, or communicating/normal pressure hydrocephalus in the appropriate clinical setting. Stable generalized parenchymal atrophy and cerebral white matter chronic small vessel  ischemic disease.  MRI Brain 02/02/2021 personally reviewed by attending physician: 1. Similar ventriculomegaly with findings (detailed above) that are suggestive of normal pressure hydrocephalus, particularly given the patient's reported confusion, weakness and gait abnormality with falls. Central prominent cerebral atrophy with ex vacuo ventricular dilation is a differential consideration. 2. Periventricular T2/FLAIR hyperintensity may represent transependymal flow of CSF given conclusion #1 versus chronic microvascular ischemic disease. 3. Otherwise, no acute abnormality on this motion limited exam. 4. Small bilateral mastoid effusions.  Routine EEG: "Thistechnically difficultstudy is suggestive of moderate diffuse encephalopathy, nonspecific etiology.No seizures or epileptiform discharges were seen throughout the recording."  Assessment: 55 yo female who was admitted 21 days ago for multiple issues and has had worsening AMS as days progress. There has been no reversible cause identified thus far.She does have enlarged ventricles on CT and she does have symptoms concerning for possible NPH.  Given blood sugar has been well controlled for the past 24+ hours, appropriate to attempt large-volume drainage trial today  Impression:  1. Encephalopathy, multifactorial.  2. Substance abuse with Cocaine. 3. 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 Post-testing will need to be carefully coordinated with PT as it will need to be completed within 30 minutes of drainage to avoid false positives.  Order will be placed to allow ambulation immediately post LP. - CSF studies to include amyloid B1-42,  total tau and p-tau (all three are typically low in NPH; while beta1-42 is low and total tau and p-tau are elevated in AD), ordered  Brooke Dare MD-PhD [facility] [phone] 02/05/2021, 8:23 AM Available 7 AM to 7 PM, outside these hours please contact Neurologist on call listed on AMION

## 2021-02-06 ENCOUNTER — Inpatient Hospital Stay (HOSPITAL_COMMUNITY): Payer: Medicaid Other

## 2021-02-06 LAB — CSF CELL COUNT WITH DIFFERENTIAL
Eosinophils, CSF: 3 % — ABNORMAL HIGH (ref 0–1)
Lymphs, CSF: 40 % (ref 40–80)
Monocyte-Macrophage-Spinal Fluid: 2 % — ABNORMAL LOW (ref 15–45)
RBC Count, CSF: 612 /mm3 — ABNORMAL HIGH
RBC Count, CSF: 1785 /mm3 — ABNORMAL HIGH
RBC Count, CSF: 7100 /mm3 — ABNORMAL HIGH
Segmented Neutrophils-CSF: 55 % — ABNORMAL HIGH (ref 0–6)
Tube #: 1
Tube #: 1
Tube #: 4
WBC, CSF: 17 /mm3 (ref 0–5)
WBC, CSF: 2 /mm3 (ref 0–5)
WBC, CSF: 4 /mm3 (ref 0–5)

## 2021-02-06 LAB — GLUCOSE, CAPILLARY
Glucose-Capillary: 153 mg/dL — ABNORMAL HIGH (ref 70–99)
Glucose-Capillary: 238 mg/dL — ABNORMAL HIGH (ref 70–99)
Glucose-Capillary: 183 mg/dL — ABNORMAL HIGH (ref 70–99)
Glucose-Capillary: 113 mg/dL — ABNORMAL HIGH (ref 70–99)
Glucose-Capillary: 164 mg/dL — ABNORMAL HIGH (ref 70–99)

## 2021-02-06 LAB — PROTEIN, CSF: Total  Protein, CSF: 32 mg/dL (ref 15–45)

## 2021-02-06 LAB — PATHOLOGIST SMEAR REVIEW

## 2021-02-06 LAB — GLUCOSE, CSF: Glucose, CSF: 111 mg/dL — ABNORMAL HIGH (ref 40–70)

## 2021-02-06 MED ORDER — LIDOCAINE HCL (PF) 1 % IJ SOLN
5.0000 mL | Freq: Once | INTRAMUSCULAR | Status: AC
  Administered 2021-02-06: 5 mL via INTRADERMAL

## 2021-02-06 MED ORDER — INSULIN ASPART PROT & ASPART (70-30 MIX) 100 UNIT/ML ~~LOC~~ SUSP
6.0000 [IU] | Freq: Every day | SUBCUTANEOUS | Status: DC
  Administered 2021-02-07 – 2021-02-10 (×4): 6 [IU] via SUBCUTANEOUS
  Filled 2021-02-06 (×2): qty 10

## 2021-02-06 MED ORDER — INSULIN NPH (HUMAN) (ISOPHANE) 100 UNIT/ML ~~LOC~~ SUSP
3.0000 [IU] | Freq: Every day | SUBCUTANEOUS | Status: DC
  Administered 2021-02-06 – 2021-02-09 (×4): 3 [IU] via SUBCUTANEOUS

## 2021-02-06 MED ORDER — INSULIN NPH (HUMAN) (ISOPHANE) 100 UNIT/ML ~~LOC~~ SUSP
3.0000 [IU] | SUBCUTANEOUS | Status: DC
  Filled 2021-02-06: qty 10

## 2021-02-06 NOTE — Progress Notes (Addendum)
[facility] PROGRESS NOTE  Carolyn Norton ION:[id number] DOB: 08-05-1966 DOA: 01/11/2021 PCP: [name], FNP  Status: Remains inpatient appropriate because:Unsafe d/c plan  Dispo:  Patient From:  Home  Planned Disposition: To be determinedHome-family has agreed to take patient back when she is medically stable and able to mobilize independently with minimal assistance.  Medically stable for discharge:  NO  Barriers to DC: Continues to require significant assistance with mobility.             Difficult to place: Yes  Level of care: Med-Surg  Code Status: DNR Family Communication: 5/30 father-regarding possible NPH and dc planning diagnosis DVT prophylaxis: Lovenox COVID vaccination status: Unknown   HPI: 55 year old female past medical history of diabetes mellitus type 2, bipolar disorder type I, previous COVID infection, polysubstance abuse that includes cocaine.  She presented from home where she lives with her parents.  Her presenting symptoms included generalized weakness, multiple falls and confusion.  Family confirmed that she had been having the symptoms for at least a month with last use of crack cocaine 1 month prior to presentation.  Ever since that time she had not been doing well.  She would also be having issues with hyperglycemia.  In the ER she was diagnosed with severe dehydration, hyperglycemia and metabolic encephalopathy.  Since admission patient has been evaluated by the psychiatric team who has deemed her alert and oriented and having capacity to make decisions.  Patient is agreeable to placement at a rehab.  Currently she has severe physical deconditioning which was POA and PT is recommending SNF.  Because of her history of polysubstance abuse and lack of funding SNF is not an option.  Family is agreeable to taking her back in home when she is able to ambulate well enough to get back and forth for meals into the bathroom.  Subjective: Alert and  pleasant. HAS NO RECOLLECTION OF UNDERGOING LP YESTERDAY!!!  Objective: Vitals:   02/05/21 2131 02/06/21 0613  BP: 129/85 (!) 153/84  Pulse: 71 64  Resp: 16 14  Temp: 98.2 F (36.8 C) 98 F (36.7 C)  SpO2: 99% 97%    Intake/Output Summary (Last 24 hours) at 02/06/2021 0746 Last data filed at 02/06/2021 2440 Gross per 24 hour  Intake 720 ml  Output 900 ml  Net -180 ml   Filed Weights   01/11/21 1152  Weight: 99.8 kg    Exam:  Constitutional: Alert, pleasant, appears comfortable Respiratory: Lungs are clear, RA, O2 sats 97% Cardiac: S1S2, pulse regular, no JVD or peripheral edema Abdomen: LBM 6/1, soft, NT,ND, poor oral itake due to anorexia Neurologic: CN 2-12 grossly intact. Sensation intact, Strength 4/5 x all 4 extremities.  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 Neuro has requested CSF be recollected due to concerns over validity when contaminated by peripheral blood She DID NOT remember going down for  LP on 6/1!!! **I suspect she has an irreversible neurological condition and will not be able to safely return home with her elderly parents and she o/w will require 24/7 care and  supervision**  Anorexia/constipation Likely related to underlying neurocognitive issues  KUB with moderate colonic stool burden-given MOM and had BM 6/1 Was given 1 dose of milk of magnesia and monitor for result NS initiated 5/27-given persistent poor intake -6/2 will decrease rate to 50/hr  Diabetes mellitus 2 with hyperglycemia on insulin Hemoglobin A1c 11.0 Continue metformin to 1000 mg BID 6/1 Change Lantus to 70/30  insulin 6 units but change to am dosing. Did have episode of late pm hypoglycemia of 51 but am sugar elevated at 238. Will add low dose afternoon NPH 6/1 DC meal coverage since intake remains poor Continue SSI  Dyslipidemia Continue statin  History of bipolar 1 disorder/polysubstance abuse/cognitive impairment Continue escitalopram, Neurontin and Depakote-LFTs normal Cleared by psych regarding orientation and capacity Functional cognitive evaluation per SLP and Occupational Therapy with significant abnormalities   Other problems: Acute kidney injury on CKD 3a Acute kidney injury resolved after IV fluids Baseline creatinine 1.3  COPD Continue Singulair, nebs as needed, consider adding LABA  GERD Continue PPI  Hypokalemia/hypomagnesemia Resolved  Data Reviewed: Basic Metabolic Panel: Recent Labs  Lab 02/01/21 0059 02/02/21 1016  NA 133* 134*  K 4.1 3.9  CL 96* 97*  CO2 29 29  GLUCOSE 138* 294*  BUN 31* 17  CREATININE 1.37* 0.99  CALCIUM 8.6* 8.6*   Liver Function Tests: Recent Labs  Lab 02/01/21 0059 02/02/21 1016  AST 39 36  ALT 24 25  ALKPHOS 48 43  BILITOT 0.8 0.4  PROT 5.5* 5.6*  ALBUMIN 2.4* 2.4*   No results for input(s): LIPASE, AMYLASE in the last 168 hours. No results for input(s): AMMONIA in the last 168 hours. CBC: Recent Labs  Lab 02/01/21 0059 02/02/21 1016  WBC 8.0 5.1  NEUTROABS 2.6 2.4  HGB 12.1 13.0  HCT 36.3 38.4  MCV 87.5 86.5  PLT PLATELET CLUMPS NOTED ON SMEAR, UNABLE TO ESTIMATE 104*   Cardiac  Enzymes: No results for input(s): CKTOTAL, CKMB, CKMBINDEX, TROPONINI in the last 168 hours. BNP (last 3 results) Recent Labs    01/14/21 0238 01/15/21 0230 01/16/21 0201  BNP 177.8* 115.9* 40.9    ProBNP (last 3 results) No results for input(s): PROBNP in the last 8760 hours.  CBG: Recent Labs  Lab 02/05/21 1235 02/05/21 1706 02/05/21 2133 02/06/21 0424 02/06/21 0743  GLUCAP 233* 160* 51* 153* 238*    Recent Results (from the past 240 hour(s))  CSF culture w Gram Stain     Status: None (Preliminary result)   Collection Time: 02/05/21  2:25 PM   Specimen: PATH Cytology CSF; Cerebrospinal Fluid  Result Value Ref Range Status   Specimen Description CSF  Final   Special Requests NONE  Final   Gram Stain   Final    NO WBC SEEN NO ORGANISMS SEEN Performed at [facility], [address] [place], [place] [address]    Culture PENDING  Incomplete   Report Status PENDING  Incomplete     Studies: DG FL GUIDED LUMBAR PUNCTURE  Result Date: 02/05/2021 Jule Economy, MD     02/05/2021  2:41 PM L3-4, L2-3 were accessed with a 20 gauge needle without difficulty.  Blood tinged at both levels that started to clear but with very limited csf flow at both levels. Head of bed elevation  and valsalva made little impact.  Clear tidalling of csf with valsalva and respiration was noted. See dedicated report for further detail.  The patient tolerated the procedure well without signs of complication.  Referring physician was notified.   Scheduled Meds: . aspirin  81 mg Oral Daily  . atorvastatin  20 mg Oral Daily  . cholecalciferol  1,000 Units Oral Daily  . divalproex  500 mg Oral BID  . enoxaparin (LOVENOX) injection  40 mg Subcutaneous Daily  . escitalopram  20 mg Oral Daily  . feeding supplement (GLUCERNA SHAKE)  237 mL Oral TID AC & HS  . gabapentin  300 mg Oral BID  . insulin aspart  0-15 Units Subcutaneous TID WC  . insulin aspart  0-5 Units Subcutaneous QHS  . insulin  aspart  3 Units Subcutaneous TID WC  . insulin glargine  6 Units Subcutaneous Daily  . metFORMIN  1,000 mg Oral BID WC  . montelukast  10 mg Oral QHS  . pantoprazole  40 mg Oral Daily  . senna-docusate  2 tablet Oral Daily  . thiamine  100 mg Oral Daily   Continuous Infusions: . sodium chloride 100 mL/hr at 02/05/21 1519    Principal Problem:   Bipolar 1 disorder, depressed, full remission (HCC) Active Problems:   Bipolar 1 disorder (HCC)   Generalized weakness   Consultants:  Psychiatry  Procedures:  None  Antibiotics: Anti-infectives (From admission, onward)   None       Time spent: 35 minutes    [name] ANP  [facility] 7 am - 330 pm/M-F for direct patient care and secure chat Please refer to Amion for contact info 25  days

## 2021-02-06 NOTE — Plan of Care (Signed)
Patient remains free from falls. Safety precautions maintained. 

## 2021-02-06 NOTE — Care Plan (Signed)
Unfortunately due to miscommunication today, large volume tap was not completed as planned. Will reach out to interventional radiologist on call tomorrow to reattempt  Brooke Dare MD-PhD [facility] [phone]

## 2021-02-06 NOTE — Progress Notes (Signed)
NEUROLOGY PROGRESS NOTE  Subjective/major events: LP contaminated likely by venous plexus despite two attempts by IR on 6/1, 5 cc obtained 9 cc fluid obtained 6/2 without post-testing completed Today 26 cc fluid obtained, PT testing completed, not yet scanned into chart  Exam: Current vital signs: BP (!) 153/84 (BP Location: Left Arm)   Pulse 64   Temp 98 F (36.7 C)   Resp 14   Ht 5\' 6"  (1.676 m)   Wt 99.8 kg   LMP 01/26/2017 (Approximate)   SpO2 97%   BMI 35.51 kg/m  Vital signs in last 24 hours: Temp:  [98 F (36.7 C)-98.3 F (36.8 C)] 98 F (36.7 C) (06/02 08-28-1981) Pulse Rate:  [64-71] 64 (06/02 0613) Resp:  [14-16] 14 (06/02 0613) BP: (121-153)/(73-85) 153/84 (06/02 0613) SpO2:  [97 %-99 %] 97 % (06/02 08-28-1981)   Physical Exam  Constitutional: Appears well-developed and well-nourished.  Psych: Affect inappropriate sexual advances to nurse and physician ("I want to smack your ass" to MD, tries to kiss nurse" Eyes: No scleral injection HENT: No OP obstruction Head: Normocephalic.  Cardiovascular: Normal rate and regular rhythm.  Respiratory: Effort normal, non-labored breathing GI: Soft.  No distension. There is no tenderness.  GU: Copious soft loose stool cleaned by nursing Skin: Warm dry and intact visible skin Musculoskeletal: Lumbar spinous processes difficult to appreciate.  Appears to have mild scoliosis as well.   Neuro:  Mental Status: Alert, oriented to self but reports it is 2020 and reports the month as "May" Does not recall what procedure she had today, but knows her stool is about to be cleaned. Slow and mildly dysarthric speech, repeats "today is a funny day "instead of "today is a sunny day."  Unable to copy complex finger positions  Cranial Nerves: II:  Visual fields grossly normal, III,IV, VI: tracks examiner in all directions V,VII: smile symmetric, at times mild right nasolabial fold flattening but symmetric activation, facial light touch sensation  normal bilaterally VIII: hearing normal bilaterally Motor: No pronator drift bilaterally.  Asterixis is less apparent today. Continues to have significant retropulsion but not as severe as prior exam; she is able to stand briefly Cerebellar: Slow finger-to-nose, reduced dysmetria at end range bilaterally symmetric  Gait:  Magnetic gait, shuffling, does not pick up / position feet or legs well. Apraxia of gait but moves more easily to lower herself into a chair.   Medications:  Current Facility-Administered Medications:  .  0.9 %  sodium chloride infusion, , Intravenous, Continuous, June, NP, Last Rate: 100 mL/hr at 02/05/21 1519, New Bag at 02/05/21 1519 .  acetaminophen (TYLENOL) tablet 650 mg, 650 mg, Oral, Q6H PRN, 04/07/21, DO, 650 mg at 02/05/21 2238 .  albuterol (PROVENTIL) (2.5 MG/3ML) 0.083% nebulizer solution 3 mL, 3 mL, Nebulization, Q6H PRN, Hall, Carole N, DO .  aspirin chewable tablet 81 mg, 81 mg, Oral, Daily, 2239, MD, 81 mg at 02/06/21 0827 .  atorvastatin (LIPITOR) tablet 20 mg, 20 mg, Oral, Daily, Hall, Carole N, DO, 20 mg at 02/06/21 0825 .  bisacodyl (DULCOLAX) suppository 10 mg, 10 mg, Rectal, Daily PRN, 04/08/21, MD .  cholecalciferol (VITAMIN D3) tablet 1,000 Units, 1,000 Units, Oral, Daily, Tyrone Nine, NP, 1,000 Units at 02/06/21 0826 .  dextrose 50 % solution 0-50 mL, 0-50 mL, Intravenous, PRN, 08[birth year]1974, MD, 50 mL at 01/26/21 1722 .  divalproex (DEPAKOTE) DR tablet 500 mg, 500 mg, Oral, BID, 01/28/21, Thedore Mins, MD,  500 mg at 02/06/21 0824 .  enoxaparin (LOVENOX) injection 40 mg, 40 mg, Subcutaneous, Daily, Dow Adolph N, DO, 40 mg at 02/06/21 0827 .  escitalopram (LEXAPRO) tablet 20 mg, 20 mg, Oral, Daily, Hall, Carole N, DO, 20 mg at 02/06/21 4268 .  famotidine (PEPCID) tablet 20 mg, 20 mg, Oral, BID PRN, Russella Dar, NP .  feeding supplement (GLUCERNA SHAKE) (GLUCERNA SHAKE) liquid 237 mL, 237 mL, Oral, TID AC &  HS, Russella Dar, NP, 237 mL at 02/05/21 2239 .  gabapentin (NEURONTIN) capsule 300 mg, 300 mg, Oral, BID, Leroy Sea, MD, 300 mg at 02/06/21 0828 .  insulin aspart (novoLOG) injection 0-15 Units, 0-15 Units, Subcutaneous, TID WC, Tyrone Nine, MD, 5 Units at 02/06/21 0840 .  insulin aspart (novoLOG) injection 0-5 Units, 0-5 Units, Subcutaneous, QHS, Grunz, Ryan B, MD .  insulin aspart (novoLOG) injection 3 Units, 3 Units, Subcutaneous, TID WC, Tyrone Nine, MD, 3 Units at 02/06/21 0840 .  insulin glargine (LANTUS) injection 6 Units, 6 Units, Subcutaneous, Daily, Tyrone Nine, MD, 6 Units at 02/06/21 [phone] .  LORazepam (ATIVAN) injection 1 mg, 1 mg, Intravenous, PRN, Jerald Kief, MD .  melatonin tablet 3 mg, 3 mg, Oral, QHS PRN, Darlin Drop, DO, 3 mg at 02/05/21 2238 .  metFORMIN (GLUCOPHAGE) tablet 1,000 mg, 1,000 mg, Oral, BID WC, Russella Dar, NP, 1,000 mg at 02/06/21 0826 .  montelukast (SINGULAIR) tablet 10 mg, 10 mg, Oral, QHS, Hall, Carole N, DO, 10 mg at 02/05/21 2239 .  ondansetron (ZOFRAN) injection 4 mg, 4 mg, Intravenous, Q6H PRN, Dow Adolph N, DO, 4 mg at 02/03/21 1946 .  pantoprazole (PROTONIX) EC tablet 40 mg, 40 mg, Oral, Daily, Dow Adolph N, DO, 40 mg at 02/06/21 0826 .  senna-docusate (Senokot-S) tablet 2 tablet, 2 tablet, Oral, Daily, Tyrone Nine, MD, 2 tablet at 02/06/21 [phone] .  thiamine tablet 100 mg, 100 mg, Oral, Daily, Kirby-Graham, Beather Arbour, NP, 100 mg at 02/06/21 9798   Pertinent Labs and Imaging  CT Head without contrast 01/11/21: personally reviewed by attending physician 1. No acute intracranial abnormality. 2. Similar-appearing prominence of the lateral ventricles may be related to central predominant atrophy, although a component of normal pressure/communicating hydrocephalus cannot be excluded.  CT Head without contrast 01/29/2021: personally reviewed by attending physician No evidence of acute intracranial hemorrhage or acute  infarction. Unchanged prominence of the lateral and third ventricles as compared to the head CT of 01/11/2021. This may be secondary to central predominant atrophy, or communicating/normal pressure hydrocephalus in the appropriate clinical setting. Stable generalized parenchymal atrophy and cerebral white matter chronic small vessel ischemic disease.  MRI Brain 02/02/2021 personally reviewed by attending physician: 1. Similar ventriculomegaly with findings (detailed above) that are suggestive of normal pressure hydrocephalus, particularly given the patient's reported confusion, weakness and gait abnormality with falls. Central prominent cerebral atrophy with ex vacuo ventricular dilation is a differential consideration. 2. Periventricular T2/FLAIR hyperintensity may represent transependymal flow of CSF given conclusion #1 versus chronic microvascular ischemic disease. 3. Otherwise, no acute abnormality on this motion limited exam. 4. Small bilateral mastoid effusions.  Routine EEG: "Thistechnically difficultstudy is suggestive of moderate diffuse encephalopathy, nonspecific etiology.No seizures or epileptiform discharges were seen throughout the recording."  Assessment: 55 yo female who was admitted 21 days ago for multiple issues and has had worsening AMS as days progress. There has been no reversible cause identified thus far.She does have enlarged ventricles on CT  and she does have symptoms concerning for possible NPH.  Now s/p adequate large volume LP trial with results pending. No significant change on my exam (many hours later due to code strokes and other emergent consults)  Impression:  1. Encephalopathy, multifactorial.  2. Substance abuse with Cocaine. 3. 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  Recommendations: -Will follow-up post-LP PT testing results as available and discuss with family  Brooke Dare MD-PhD [facility] [phone] 02/06/2021, 9:18 AM Available 7 AM to 7 PM,  outside these hours please contact Neurologist on call listed on AMION  Greater than 35 minutes were spent in care of this patient today, >50% at bedside, re-examining gait myself in detail, coordinating LP with IR team and PT team.

## 2021-02-06 NOTE — Procedures (Signed)
Lumbar Puncture Procedure Note  Munachimso Rigdon  [id number]  1966/06/09  Date:02/06/21  Time:11:00 AM   Provider Performing:Ernst Cumpston Allena Katz   Procedure: Fluoroscopic Guided LP  Indication(s) Rule out meningitis  Consent Risks of the procedure as well as the alternatives and risks of each were explained to the patient and/or caregiver.  Consent for the procedure was obtained and is signed in the bedside chart  Anesthesia Topical only with 1% lidocaine    Time Out Verified patient identification, verified procedure, site/side was marked, verified correct patient position, special equipment/implants available, medications/allergies/relevant history reviewed, required imaging and test results available.   Sterile Technique Maximal sterile technique including sterile barrier drape, hand hygiene, sterile gown, sterile gloves, mask, hair covering.    Procedure Description Refer to dictation in PACS. Fluoroscopy used to identify appropriate trajectory. Lidocaine used to anesthetize skin and subcutaneous tissue overlying this area.  A 20g spinal needle was then used to access the subarachnoid space. 13 mL CSF obtained. This was initially serosanguinous with gradual clearing.   Complications/Tolerance None; patient tolerated the procedure well.   EBL Minimal   Specimen(s) CSF

## 2021-02-06 NOTE — Progress Notes (Signed)
Inpatient Diabetes Program Recommendations  AACE/ADA: New Consensus Statement on Inpatient Glycemic Control (2015)  Target Ranges:  Prepandial:   less than 140 mg/dL      Peak postprandial:   less than 180 mg/dL (1-2 hours)      Critically ill patients:  140 - 180 mg/dL   Lab Results  Component Value Date   GLUCAP 183 (H) 02/06/2021   HGBA1C 11.0 (H) 01/12/2021    Review of Glycemic Control  Diabetes history: DM2 Outpatient Diabetes medications: Lantus 45 units QHS Current orders for Inpatient glycemic control: 70/30 6 units with Breakfast, NPH 3 units with supper, Novolog 0-15 units TID with meals and 0-5 HS, metformin 1000 mg BID  Had low of 51 mg/dL yesterday at HS. May be from getting Novolog meal coverage without eating.  Inpatient Diabetes Program Recommendations:     Decrease Novolog to 0-9 units TID with meals and 0-5 HS.  New orders noted.  Follow closely.  Thank you. Ailene Ards, RD, LDN, CDE Inpatient Diabetes Coordinator [phone]

## 2021-02-07 ENCOUNTER — Inpatient Hospital Stay (HOSPITAL_COMMUNITY): Payer: Medicaid Other

## 2021-02-07 DIAGNOSIS — G9389 Other specified disorders of brain: Secondary | ICD-10-CM

## 2021-02-07 LAB — CSF CELL COUNT WITH DIFFERENTIAL
RBC Count, CSF: 1735 /mm3 — ABNORMAL HIGH
RBC Count, CSF: 2555 /mm3 — ABNORMAL HIGH
Tube #: 1
Tube #: 4
WBC, CSF: 2 /mm3 (ref 0–5)
WBC, CSF: 3 /mm3 (ref 0–5)

## 2021-02-07 LAB — MISC LABCORP TEST (SEND OUT): Labcorp test code: 177

## 2021-02-07 LAB — GLUCOSE, CAPILLARY
Glucose-Capillary: 198 mg/dL — ABNORMAL HIGH (ref 70–99)
Glucose-Capillary: 212 mg/dL — ABNORMAL HIGH (ref 70–99)
Glucose-Capillary: 120 mg/dL — ABNORMAL HIGH (ref 70–99)
Glucose-Capillary: 98 mg/dL (ref 70–99)

## 2021-02-07 MED ORDER — LIDOCAINE HCL (PF) 1 % IJ SOLN
5.0000 mL | Freq: Once | INTRAMUSCULAR | Status: AC
  Administered 2021-02-07: 5 mL via INTRADERMAL

## 2021-02-07 MED ORDER — LORAZEPAM 2 MG/ML IJ SOLN: 1.0000 mg | Freq: Once | INTRAMUSCULAR | Status: DC | PRN

## 2021-02-07 NOTE — Progress Notes (Signed)
[facility] PROGRESS NOTE  Lucilla LameMichelle Lee Advincula ZOX:[id number] DOB: [birth year] DOA: 01/11/2021 PCP: Dartha LodgeSteele, Anthony, FNP  Status: Remains inpatient appropriate because:Unsafe d/c plan  Dispo:  Patient From:  Home  Planned Disposition: To be determinedHome-family has agreed to take patient back when she is medically stable and able to mobilize independently with minimal assistance.  Medically stable for discharge:  NO  Barriers to DC: Continues to require significant assistance with mobility.             Difficult to place: Yes  Level of care: Med-Surg  Code Status: DNR Family Communication: 5/30 father-regarding possible NPH and dc planning diagnosis-6/3 updated by TOC re dc plans DVT prophylaxis: Lovenox COVID vaccination status: Unknown   HPI: 55 year old female past medical history of diabetes mellitus type 2, bipolar disorder type I, previous COVID infection, polysubstance abuse that includes cocaine.  She presented from home where she lives with her parents.  Her presenting symptoms included generalized weakness, multiple falls and confusion.  Family confirmed that she had been having the symptoms for at least a month with last use of crack cocaine 1 month prior to presentation.  Ever since that time she had not been doing well.  She would also be having issues with hyperglycemia.  In the ER she was diagnosed with severe dehydration, hyperglycemia and metabolic encephalopathy.  Since admission patient has been evaluated by the psychiatric team who has deemed her alert and oriented and having capacity to make decisions.  Patient is agreeable to placement at a rehab.  Currently she has severe physical deconditioning which was POA and PT is recommending SNF.  Because of her history of polysubstance abuse and lack of funding SNF is not an option.  Family is agreeable to taking her back in home when she is able to ambulate well enough to get back and forth for meals into the  bathroom.  Subjective: Alert and very pleasant today.  She recalls having her LP procedure done yesterday on 6/2.  Remains pleasantly disoriented.  Objective: Vitals:   02/06/21 1935 02/07/21 0548  BP: (!) 122/57 128/65  Pulse: 76 78  Resp: 18 19  Temp: 98.2 F (36.8 C) 98.5 F (36.9 C)  SpO2: 98% 98%    Intake/Output Summary (Last 24 hours) at 02/07/2021 1202 Last data filed at 02/07/2021 0400 Gross per 24 hour  Intake --  Output 1950 ml  Net -1950 ml   Filed Weights   01/11/21 1152  Weight: 99.8 kg    Exam:  Constitutional: Alert, calm, comfortable Respiratory: Bilateral anterior lung sounds are clear to auscultation, no increased work of breathing, room air with O2 saturation 98% Cardiac: Normal heart sounds, normotensive, no peripheral edema.  Regular pulse Abdomen: LBM 6/1, soft nontender nondistended with normoactive bowel sounds.  Ate about 35% of breakfast tray Neurologic: CN 2-12 grossly intact. Sensation intact, Strength 4/5 x all 4 extremities.  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 She DID NOT remember going down for LP on 6/1!!! **I suspect she has an irreversible neurological  condition and will not be able to safely return home with her elderly parents and she o/w will require 24/7 care and supervision**  Anorexia/constipation Likely related to underlying neurocognitive issues  KUB with moderate colonic stool burden-given MOM and had BM 6/1 NS initiated 5/27-given persistent poor intake -6/2 will decrease rate to 50/hr  Diabetes mellitus 2 with hyperglycemia on insulin Hemoglobin A1c 11.0 Continue metformin to 1000 mg BID Continue daily 70/30  insulin 6 units afternoon NPH  Meal coverage discontinued since intake remains poor Continue SSI  Dyslipidemia Continue statin  History of bipolar 1 disorder/polysubstance abuse/cognitive impairment Continue escitalopram, Neurontin and Depakote-LFTs normal Cleared by psych regarding orientation and capacity Functional cognitive evaluation per SLP and Occupational Therapy with significant abnormalities   Other problems: Acute kidney injury on CKD 3a Acute kidney injury resolved after IV fluids Baseline creatinine 1.3  COPD Continue Singulair, nebs as needed, consider adding LABA  GERD Continue PPI  Hypokalemia/hypomagnesemia Resolved  Data Reviewed: Basic Metabolic Panel: Recent Labs  Lab 02/01/21 0059 02/02/21 1016  NA 133* 134*  K 4.1 3.9  CL 96* 97*  CO2 29 29  GLUCOSE 138* 294*  BUN 31* 17  CREATININE 1.37* 0.99  CALCIUM 8.6* 8.6*   Liver Function Tests: Recent Labs  Lab 02/01/21 0059 02/02/21 1016  AST 39 36  ALT 24 25  ALKPHOS 48 43  BILITOT 0.8 0.4  PROT 5.5* 5.6*  ALBUMIN 2.4* 2.4*   No results for input(s): LIPASE, AMYLASE in the last 168 hours. No results for input(s): AMMONIA in the last 168 hours. CBC: Recent Labs  Lab 02/01/21 0059 02/02/21 1016  WBC 8.0 5.1  NEUTROABS 2.6 2.4  HGB 12.1 13.0  HCT 36.3 38.4  MCV 87.5 86.5  PLT PLATELET CLUMPS NOTED ON SMEAR, UNABLE TO ESTIMATE 104*   Cardiac Enzymes: No results for input(s): CKTOTAL, CKMB, CKMBINDEX, TROPONINI  in the last 168 hours. BNP (last 3 results) Recent Labs    01/14/21 0238 01/15/21 0230 01/16/21 0201  BNP 177.8* 115.9* 40.9    ProBNP (last 3 results) No results for input(s): PROBNP in the last 8760 hours.  CBG: Recent Labs  Lab 02/06/21 1216 02/06/21 1638 02/06/21 2119 02/07/21 0731 02/07/21 1153  GLUCAP 183* 113* 164* 198* 212*    Recent Results (from the past 240 hour(s))  CSF culture w Gram Stain     Status: None (Preliminary result)   Collection Time: 02/05/21  2:25 PM   Specimen: PATH Cytology CSF; Cerebrospinal Fluid  Result Value Ref Range Status   Specimen Description CSF  Final   Special Requests NONE  Final   Gram Stain NO WBC SEEN NO ORGANISMS SEEN   Final   Culture   Final    NO GROWTH 2 DAYS Performed at [facility], [address], [place], [place] [address]    Report Status PENDING  Incomplete  CSF culture w Gram Stain     Status: None (Preliminary result)   Collection Time: 02/06/21 11:08 AM   Specimen: PATH Cytology CSF; Cerebrospinal Fluid  Result Value Ref Range Status   Specimen Description CSF  Final   Special Requests NONE  Final   Gram Stain   Final    WBC PRESENT,BOTH PMN AND MONONUCLEAR NO ORGANISMS SEEN CYTOSPIN SMEAR    Culture  Final    NO GROWTH < 24 HOURS Performed at [facility], [address] [place], [place] [address]    Report Status PENDING  Incomplete     Studies: DG FL GUIDED LUMBAR PUNCTURE  Result Date: 02/06/2021 CLINICAL DATA:  Altered mental status EXAM: DIAGNOSTIC LUMBAR PUNCTURE UNDER FLUOROSCOPIC GUIDANCE COMPARISON:  None FLUOROSCOPY TIME:  Fluoroscopy Time:  48 seconds Radiation Exposure Index (if provided by the fluoroscopic device): 33.6 PROCEDURE: Informed consent was obtained from the patient prior to the procedure, including potential complications of headache, allergy, and pain. With the patient prone, the lower back was prepped with Betadine. 1% Lidocaine was used for local anesthesia.  Lumbar puncture was performed at the L3-L4 level using a 20 gauge needle with return of serosanguineous CSF that gradually cleared. 13 ml of CSF were obtained for laboratory studies. The patient tolerated the procedure well and there were no apparent complications. IMPRESSION: Technically successful fluoroscopic guided lumbar puncture. Electronically Signed   By: Guadlupe Spanish M.D.   On: 02/06/2021 13:20   DG FL GUIDED LUMBAR PUNCTURE  Result Date: 02/05/2021 CLINICAL DATA:  Suspected normal pressure hydrocephalus. EXAM: DIAGNOSTIC LUMBAR PUNCTURE UNDER FLUOROSCOPIC GUIDANCE COMPARISON:  Brain MRI and head CT from May of 2022 with CT of the chest abdomen pelvis from 2018. FLUOROSCOPY TIME:  Fluoroscopy Time:  1 minutes 6 seconds Radiation Exposure Index (if provided by the fluoroscopic device): 12.5 Number of Acquired Spot Images: 0 PROCEDURE: Informed consent was obtained from the patient prior to the procedure, including potential complications of headache, allergy, and pain. With the patient prone, the lower back was prepped with Betadine. 1% Lidocaine was used for local anesthesia. Initial attempt at lumbar puncture was performed at the L3-4 level. CSF mildly blood tinged began to flow, initially clearing. Small amount of blood gathered in the above of the needle upon turning the needle to increased flow. Head of the bed was elevated. Clot was cleared from the hub. Clear CSF flow around this area and showed pulsation as expected in variation with respiration and talking. Opening pressure approximately 12.5 cm of water. Approximately 5 cc was removed initially with some loss of fluid in manipulating tubing and attempting to position the patient for best return. Second attempt was performed at the L2-3 level with similar results, 4 cc removed. IMPRESSION: Technically successful lumbar puncture with low opening pressures that did not yield significant quantity of CSF. CSF was blood tinged but did appear to  clear around small amount of blood in the hub but would not flow in a significant quantity despite indications that the needle was not obstructed. Total of 9 cc was sent to the laboratory for requested testing. Electronically Signed   By: Donzetta Kohut M.D.   On: 02/05/2021 16:01    Scheduled Meds: . aspirin  81 mg Oral Daily  . atorvastatin  20 mg Oral Daily  . cholecalciferol  1,000 Units Oral Daily  . divalproex  500 mg Oral BID  . enoxaparin (LOVENOX) injection  40 mg Subcutaneous Daily  . escitalopram  20 mg Oral Daily  . feeding supplement (GLUCERNA SHAKE)  237 mL Oral TID AC & HS  . gabapentin  300 mg Oral BID  . insulin aspart  0-15 Units Subcutaneous TID WC  . insulin aspart  0-5 Units Subcutaneous QHS  . insulin aspart protamine- aspart  6 Units Subcutaneous Q breakfast  . insulin NPH Human  3 Units Subcutaneous Q supper  . metFORMIN  1,000  mg Oral BID WC  . montelukast  10 mg Oral QHS  . pantoprazole  40 mg Oral Daily  . senna-docusate  2 tablet Oral Daily  . thiamine  100 mg Oral Daily   Continuous Infusions: . sodium chloride 50 mL/hr at 02/06/21 1613    Principal Problem:   Bipolar 1 disorder, depressed, full remission (HCC) Active Problems:   Bipolar 1 disorder (HCC)   Generalized weakness   Consultants:  Psychiatry  Procedures:  None  Antibiotics: Anti-infectives (From admission, onward)   None       Time spent: 35 minutes    [name] ANP  [facility] 7 am - 330 pm/M-F for direct patient care and secure chat Please refer to Amion for contact info 26  days

## 2021-02-07 NOTE — Progress Notes (Signed)
[facility] PROGRESS NOTE  Carolyn Norton ZOX:[id number] DOB: [birth year] DOA: 01/11/2021 PCP: [name], FNP  Status: Remains inpatient appropriate because:Unsafe d/c plan  Dispo:  Patient From:  Home  Planned Disposition: To be determined  Medically stable for discharge:  NO  Barriers to DC: Continues to require significant assistance with mobility.             Difficult to place: Yes  Level of care: Med-Surg  Code Status: DNR Family Communication: 5/30 father-regarding possible NPH and dc planning  DVT prophylaxis: Lovenox COVID vaccination status: Unknown   HPI: 55 year old female past medical history of diabetes mellitus type 2, bipolar disorder type I, previous COVID infection, polysubstance abuse that includes cocaine.  She presented from home where she lives with her parents.  Her presenting symptoms included generalized weakness, multiple falls and confusion.  Family confirmed that she had been having the symptoms for at least a month with last use of crack cocaine 1 month prior to presentation.  Ever since that time she had not been doing well.  She would also be having issues with hyperglycemia.  In the ER she was diagnosed with severe dehydration, hyperglycemia and metabolic encephalopathy.  Since admission patient has been evaluated by the psychiatric team who has deemed her alert and oriented and having capacity to make decisions.  Patient is agreeable to placement at a rehab.  Currently she has severe physical deconditioning which was POA and PT is recommending SNF.  Because of her history of polysubstance abuse and lack of funding SNF is not an option.  Family is agreeable to taking her back in home when she is able to ambulate well enough to get back and forth for meals into the bathroom.  Subjective:   Objective: Vitals:   02/06/21 1935 02/07/21 0548  BP: (!) 122/57 128/65  Pulse: 76 78  Resp: 18 19  Temp: 98.2 F (36.8 C) 98.5 F (36.9 C)   SpO2: 98% 98%    Intake/Output Summary (Last 24 hours) at 02/07/2021 0825 Last data filed at 02/07/2021 0400 Gross per 24 hour  Intake --  Output 1950 ml  Net -1950 ml   Filed Weights   01/11/21 1152  Weight: 99.8 kg    Exam:  Constitutional: A lert and calm Respiratory: Lungs CTA, RA Cardiac: S1S2, pulse regular, no LE edema Abdomen: LBM 6/1, soft ,NR, ND porr intake Neurologic: CN 2-12 grossly intact. Sensation intact, Strength 4/5 x all 4 extremities.  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 Significantly abnormal screening tests (short Blessed test and SLUMS)  Appreciate assistance of neurology and palliative team EEG neg for seizures MRI suggestive of NPH w/ transependymal flow-large volume LP in progress Repeat large volume LP on 6/3 c/w NPH given some improvement in mobility and mentation post procedure   Anorexia/constipation Likely related to underlying neurocognitive issues  KUB with moderate colonic stool burden-given MOM and had BM 6/1 Was given 1 dose of milk of magnesia and monitor for result NS initiated 5/27-given persistent poor intake -6/2 will decrease rate to 50/hr  Diabetes mellitus 2 with hyperglycemia on insulin Hemoglobin A1c 11.0 Continue metformin to 1000 mg BID 6/1  Change Lantus to 70/30  insulin 6 units but change to am dosing. Did have episode of late pm hypoglycemia of 51 but am sugar elevated at 238. Will add low dose afternoon NPH 6/1 DC meal coverage since intake remains poor Continue  SSI  Dyslipidemia Continue statin  History of bipolar 1 disorder/polysubstance abuse/cognitive impairment Continue escitalopram, Neurontin and Depakote-LFTs normal Cleared by psych regarding orientation and capacity Functional cognitive evaluation per SLP and Occupational Therapy with significant abnormalities   Other problems: Acute kidney injury on CKD 3a Acute kidney injury resolved after IV fluids Baseline creatinine 1.3  COPD Continue Singulair, nebs as needed, consider adding LABA  GERD Continue PPI  Hypokalemia/hypomagnesemia Resolved  Data Reviewed: Basic Metabolic Panel: Recent Labs  Lab 02/01/21 0059 02/02/21 1016  NA 133* 134*  K 4.1 3.9  CL 96* 97*  CO2 29 29  GLUCOSE 138* 294*  BUN 31* 17  CREATININE 1.37* 0.99  CALCIUM 8.6* 8.6*   Liver Function Tests: Recent Labs  Lab 02/01/21 0059 02/02/21 1016  AST 39 36  ALT 24 25  ALKPHOS 48 43  BILITOT 0.8 0.4  PROT 5.5* 5.6*  ALBUMIN 2.4* 2.4*   No results for input(s): LIPASE, AMYLASE in the last 168 hours. No results for input(s): AMMONIA in the last 168 hours. CBC: Recent Labs  Lab 02/01/21 0059 02/02/21 1016  WBC 8.0 5.1  NEUTROABS 2.6 2.4  HGB 12.1 13.0  HCT 36.3 38.4  MCV 87.5 86.5  PLT PLATELET CLUMPS NOTED ON SMEAR, UNABLE TO ESTIMATE 104*   Cardiac Enzymes: No results for input(s): CKTOTAL, CKMB, CKMBINDEX, TROPONINI in the last 168 hours. BNP (last 3 results) Recent Labs    01/14/21 0238 01/15/21 0230 01/16/21 0201  BNP 177.8* 115.9* 40.9    ProBNP (last 3 results) No results for input(s): PROBNP in the last 8760 hours.  CBG: Recent Labs  Lab 02/06/21 0743 02/06/21 1216 02/06/21 1638 02/06/21 2119 02/07/21 0731  GLUCAP 238* 183* 113* 164* 198*    Recent Results (from the past 240 hour(s))  CSF culture w Gram Stain     Status: None (Preliminary result)   Collection Time: 02/05/21  2:25 PM   Specimen: PATH Cytology CSF; Cerebrospinal Fluid  Result Value Ref  Range Status   Specimen Description CSF  Final   Special Requests NONE  Final   Gram Stain NO WBC SEEN NO ORGANISMS SEEN   Final   Culture   Final    NO GROWTH < 12 HOURS Performed at [facility], [address], [place], [place] [address]    Report Status PENDING  Incomplete  CSF culture w Gram Stain     Status: None (Preliminary result)   Collection Time: 02/06/21 11:08 AM   Specimen: PATH Cytology CSF; Cerebrospinal Fluid  Result Value Ref Range Status   Specimen Description CSF  Final   Special Requests NONE  Final   Gram Stain   Final    WBC PRESENT,BOTH PMN AND MONONUCLEAR NO ORGANISMS SEEN CYTOSPIN SMEAR Performed at [facility], [address], [place], [place] [address]    Culture PENDING  Incomplete   Report Status PENDING  Incomplete     Studies: DG FL GUIDED LUMBAR PUNCTURE  Result Date: 02/06/2021 Guadlupe Spanish, MD     02/06/2021 11:02 AM Lumbar Puncture Procedure Note Carolyn Norton [id number] 01/26/1966 Date:02/06/21 Time:11:00 AM Provider Performing:Praneil Allena Katz Procedure: Fluoroscopic Guided LP Indication(s) Rule out meningitis Consent Risks of the procedure as well  as the alternatives and risks of each were explained to the patient and/or caregiver.  Consent for the procedure was obtained and is signed in the bedside chart Anesthesia Topical only with 1% lidocaine Time Out Verified patient identification, verified procedure, site/side was marked, verified correct patient position, special equipment/implants available, medications/allergies/relevant history reviewed, required imaging and test results available. Sterile Technique Maximal sterile technique including sterile barrier drape, hand hygiene, sterile gown, sterile gloves, mask, hair covering. Procedure Description Refer to dictation in PACS. Fluoroscopy used to identify appropriate trajectory. Lidocaine used to anesthetize skin and subcutaneous tissue overlying this area.  A 20g spinal needle  was then used to access the subarachnoid space. 13 mL CSF obtained. This was initially serosanguinous with gradual clearing. Complications/Tolerance None; patient tolerated the procedure well. EBL Minimal Specimen(s) CSF   DG FL GUIDED LUMBAR PUNCTURE  Result Date: 02/05/2021 Jule Economy, MD     02/05/2021  2:41 PM L3-4, L2-3 were accessed with a 20 gauge needle without difficulty.  Blood tinged at both levels that started to clear but with very limited csf flow at both levels. Head of bed elevation and valsalva made little impact.  Clear tidalling of csf with valsalva and respiration was noted. See dedicated report for further detail.  The patient tolerated the procedure well without signs of complication.  Referring physician was notified.   Scheduled Meds: . aspirin  81 mg Oral Daily  . atorvastatin  20 mg Oral Daily  . cholecalciferol  1,000 Units Oral Daily  . divalproex  500 mg Oral BID  . enoxaparin (LOVENOX) injection  40 mg Subcutaneous Daily  . escitalopram  20 mg Oral Daily  . feeding supplement (GLUCERNA SHAKE)  237 mL Oral TID AC & HS  . gabapentin  300 mg Oral BID  . insulin aspart  0-15 Units Subcutaneous TID WC  . insulin aspart  0-5 Units Subcutaneous QHS  . insulin aspart protamine- aspart  6 Units Subcutaneous Q breakfast  . insulin NPH Human  3 Units Subcutaneous Q supper  . metFORMIN  1,000 mg Oral BID WC  . montelukast  10 mg Oral QHS  . pantoprazole  40 mg Oral Daily  . senna-docusate  2 tablet Oral Daily  . thiamine  100 mg Oral Daily   Continuous Infusions: . sodium chloride 50 mL/hr at 02/06/21 1613    Principal Problem:   Bipolar 1 disorder, depressed, full remission (HCC) Active Problems:   Bipolar 1 disorder (HCC)   Generalized weakness   Consultants:  Psychiatry  Procedures:  None  Antibiotics: Anti-infectives (From admission, onward)   None       Time spent: 35 minutes    [name] ANP  [facility] 7 am - 330 pm/M-F  for direct patient care and secure chat Please refer to Amion for contact info 26  days

## 2021-02-07 NOTE — Progress Notes (Signed)
   Palliative Medicine Inpatient Follow Up Note  Reason for consult:  Goals of Care "Patient with bipolar and substance abuse.  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  She is not eating. Current plan if unable to get placed would be to go home with elderly parents."  HPI:  Per intake H&P --> 55 year old female past medical history of diabetes mellitus type 2, bipolar disorder type I, previous COVID infection, polysubstance abuse that includes cocaine.  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  She is not eating or drinking sufficiently. Palliative care has been asked to get involved to further discuss goals of care in the setting of progressive failure to thrive.  Today's Discussion (02/07/2021):  *Please note that this is a verbal dictation therefore any spelling or grammatical errors are due to the "[facility]" system interpretation.   Chart reviewed.  I met with Carolyn Norton at bedside this morning. She was alert and oriented to self. She remains unclear of where she is or the situation she is in. I provided reorientation.   I spoke to Carolyn Norton's mother, Carolyn Norton via telephone this morning. Provided information from a Palliative perspective regarding patients lack of progress. Reviewed the plan for an LP today with IR. Discussed that ideally after today's procedure improvements will be observed but if they are not we will need to continue to discuss potential next steps in terms of goals of care.   Carolyn Norton requested information on patients disability and medicaid status. II shared that I will alert TOC regarding this matter to follow up with her.  Ongoing support.  Objective Assessment: Vital Signs Vitals:   02/06/21 1935 02/07/21 0548  BP: (!) 122/57 128/65  Pulse: 76 78  Resp: 18 19  Temp: 98.2 F (36.8 C) 98.5 F (36.9 C)  SpO2: 98% 98%    Intake/Output Summary (Last 24  hours) at 02/07/2021 1031 Last data filed at 02/07/2021 0400 Gross per 24 hour  Intake --  Output 1950 ml  Net -1950 ml   Last Weight  Most recent update: 01/11/2021 11:52 AM   Weight  99.8 kg (220 lb)           Gen:  Caucasian F in NAD HEENT: moist mucous membranes CV: Regular rate and rhythm  PULM: clear to auscultation bilaterally  ABD: soft/nontender  EXT: No edema  Neuro: Awake, alert  SUMMARY OF RECOMMENDATIONS DNAR/DNI, No G-Tube  Gold DNR on the chart  Appreciate IR - Plan for LP to see if NPH symptoms improve  Goals: Improve present condition. Per patients mother long term placement - she realizes that Carolyn Norton may continue to decline and if this occurs additional [facility] conversations will be needed  Ongoing incremental palliative care support  Time Spent: 25 Greater than 50% of the time was spent in counseling and coordination of care ______________________________________________________________________________________ [facility] Team Team Cell Phone: [phone] Please utilize secure chat with additional questions, if there is no response within 30 minutes please call the above phone number  Palliative Medicine Team providers are available by phone from 7am to 7pm daily and can be reached through the team cell phone.  Should this patient require assistance outside of these hours, please call the patient's attending physician.

## 2021-02-07 NOTE — Progress Notes (Signed)
Post LP Progress Note  Post LP assessment performed by PT beginning at 12:00  Post LP assessment for Mass Effect of NPH on function will be scanned into chart.   Georga Hacking [facility] PT Acute Rehabilitation Services Pager [phone] Office [phone]

## 2021-02-08 LAB — GLUCOSE, CAPILLARY
Glucose-Capillary: 203 mg/dL — ABNORMAL HIGH (ref 70–99)
Glucose-Capillary: 184 mg/dL — ABNORMAL HIGH (ref 70–99)
Glucose-Capillary: 163 mg/dL — ABNORMAL HIGH (ref 70–99)
Glucose-Capillary: 152 mg/dL — ABNORMAL HIGH (ref 70–99)

## 2021-02-08 NOTE — Progress Notes (Addendum)
NEUROLOGY PROGRESS NOTE  Subjective/major events: 6/1 LP contaminated likely by venous plexus despite two attempts by IR on 6/1, 5 cc obtained 6/2 LP w/ 9 cc fluid obtained due to slow flow 6/3 LP w/ 26 cc fluid obtained, PT testing completed, key details below  Exam: Current vital signs: BP 119/84 (BP Location: Left Arm)   Pulse 75   Temp 98.3 F (36.8 C) (Oral)   Resp 18   Ht 5\' 6"  (1.676 m)   Wt 99.8 kg   LMP 01/26/2017 (Approximate)   SpO2 99%   BMI 35.51 kg/m  Vital signs in last 24 hours: Temp:  [97.9 F (36.6 C)-98.3 F (36.8 C)] 98.3 F (36.8 C) (06/03 2033) Pulse Rate:  [66-77] 75 (06/03 2111) Resp:  [16-18] 18 (06/03 2111) BP: (119-129)/(63-115) 119/84 (06/03 2111) SpO2:  [99 %-100 %] 99 % (06/03 2111)   Physical Exam  Constitutional: Appears well-developed and well-nourished.  Psych: Calm and cooperative, not inappropriate today Eyes: No scleral injection Head: Normocephalic.  Cardiovascular: Normal rate and regular rhythm.  Respiratory: Effort normal, non-labored breathing GI: Soft.  No distension. There is no tenderness.  Skin: Warm dry and intact visible skin  Neuro:  Mental Status: Alert, oriented to self but reports it is 2002 and reports the month as "May" Does not recall what procedure she had yesterday.  When corrected on the date she can repeat that it is June 2022.  On later recall she does correctly state the year is 2022 but reports again that it the month is "5" fluent though paucity of speech Cranial Nerves: II:  Visual fields grossly normal, III,IV, VI: tracks examiner in all directions V,VII: smile symmetric, at times mild right nasolabial fold flattening but symmetric activation, facial light touch sensation normal bilaterally VIII: hearing normal bilaterally Motor: No pronator drift bilaterally.  Asterixis is absent today, mild tremor.  Cerebellar: Slow finger-to-nose, but able to complete this command Gait on 6/3 Magnetic gait,  shuffles slightly without actually moving her feet at all, does not pick up / position feet or legs well. Apraxia of gait but moves more easily to lower herself into a chair.   Medications:  Current Facility-Administered Medications:  .  0.9 %  sodium chloride infusion, , Intravenous, Continuous, 8/3, NP, Last Rate: 50 mL/hr at 02/06/21 1613, New Bag at 02/06/21 1613 .  acetaminophen (TYLENOL) tablet 650 mg, 650 mg, Oral, Q6H PRN, 04/08/21, DO, 650 mg at 02/05/21 2238 .  albuterol (PROVENTIL) (2.5 MG/3ML) 0.083% nebulizer solution 3 mL, 3 mL, Nebulization, Q6H PRN, Hall, Carole N, DO .  aspirin chewable tablet 81 mg, 81 mg, Oral, Daily, 2239, MD, 81 mg at 02/08/21 0901 .  atorvastatin (LIPITOR) tablet 20 mg, 20 mg, Oral, Daily, Hall, Carole N, DO, 20 mg at 02/08/21 0900 .  bisacodyl (DULCOLAX) suppository 10 mg, 10 mg, Rectal, Daily PRN, 04/10/21, MD .  cholecalciferol (VITAMIN D3) tablet 1,000 Units, 1,000 Units, Oral, Daily, Tyrone Nine, NP, 1,000 Units at 02/08/21 0901 .  dextrose 50 % solution 0-50 mL, 0-50 mL, Intravenous, PRN, 08-19-1973, MD, 50 mL at 01/26/21 1722 .  divalproex (DEPAKOTE) DR tablet 500 mg, 500 mg, Oral, BID, 01/28/21, MD, 500 mg at 02/08/21 0900 .  enoxaparin (LOVENOX) injection 40 mg, 40 mg, Subcutaneous, Daily, 04/10/21 N, DO, 40 mg at 02/08/21 0902 .  escitalopram (LEXAPRO) tablet 20 mg, 20 mg, Oral, Daily, Hall, Carole N, DO, 20 mg  at 02/08/21 0901 .  famotidine (PEPCID) tablet 20 mg, 20 mg, Oral, BID PRN, [name] L, NP .  feeding supplement (GLUCERNA SHAKE) (GLUCERNA SHAKE) liquid 237 mL, 237 mL, Oral, TID AC & HS, Russella Dar, NP, 237 mL at 02/08/21 0912 .  gabapentin (NEURONTIN) capsule 300 mg, 300 mg, Oral, BID, Susa Raring K, MD, 300 mg at 02/08/21 0900 .  insulin aspart (novoLOG) injection 0-15 Units, 0-15 Units, Subcutaneous, TID WC, Tyrone Nine, MD, 5 Units at 02/08/21 [id number] .  insulin  aspart (novoLOG) injection 0-5 Units, 0-5 Units, Subcutaneous, QHS, Grunz, Ryan B, MD .  insulin aspart protamine- aspart (NOVOLOG MIX 70/30) injection 6 Units, 6 Units, Subcutaneous, Q breakfast, Russella Dar, NP, 6 Units at 02/08/21 0913 .  insulin NPH Human (NOVOLIN N) injection 3 Units, 3 Units, Subcutaneous, Q supper, Jerald Kief, MD, 3 Units at 02/07/21 1638 .  LORazepam (ATIVAN) injection 1 mg, 1 mg, Intravenous, Once PRN, Nashika Coker L, MD .  melatonin tablet 3 mg, 3 mg, Oral, QHS PRN, Dow Adolph N, DO, 3 mg at 02/06/21 2151 .  metFORMIN (GLUCOPHAGE) tablet 1,000 mg, 1,000 mg, Oral, BID WC, [name] L, NP, 1,000 mg at 02/08/21 0900 .  montelukast (SINGULAIR) tablet 10 mg, 10 mg, Oral, QHS, Hall, Carole N, DO, 10 mg at 02/07/21 2239 .  ondansetron (ZOFRAN) injection 4 mg, 4 mg, Intravenous, Q6H PRN, Dow Adolph N, DO, 4 mg at 02/03/21 1946 .  pantoprazole (PROTONIX) EC tablet 40 mg, 40 mg, Oral, Daily, Dow Adolph N, DO, 40 mg at 02/08/21 0901 .  senna-docusate (Senokot-S) tablet 2 tablet, 2 tablet, Oral, Daily, Tyrone Nine, MD, 2 tablet at 02/08/21 0900 .  thiamine tablet 100 mg, 100 mg, Oral, Daily, Kirby-Graham, Beather Arbour, NP, 100 mg at 02/08/21 0901   Pertinent Data:   Ammonia WNL, B12 - 707, HIV non reactive, RPR negative, creat 1.37 (was 2.8), WBCC 8K, TSH 1.750, UA negative.   CT Head without contrast 01/11/21: personally reviewed by attending physician 1. No acute intracranial abnormality. 2. Similar-appearing prominence of the lateral ventricles may be related to central predominant atrophy, although a component of normal pressure/communicating hydrocephalus cannot be excluded.  CT Head without contrast 01/29/2021: personally reviewed by attending physician No evidence of acute intracranial hemorrhage or acute infarction. Unchanged prominence of the lateral and third ventricles as compared to the head CT of 01/11/2021. This may be secondary to central  predominant atrophy, or communicating/normal pressure hydrocephalus in the appropriate clinical setting. Stable generalized parenchymal atrophy and cerebral white matter chronic small vessel ischemic disease.  MRI Brain 02/02/2021 personally reviewed by attending physician: 1. Similar ventriculomegaly with findings (detailed above) that are suggestive of normal pressure hydrocephalus, particularly given the patient's reported confusion, weakness and gait abnormality with falls. Central prominent cerebral atrophy with ex vacuo ventricular dilation is a differential consideration. 2. Periventricular T2/FLAIR hyperintensity may represent transependymal flow of CSF given conclusion #1 versus chronic microvascular ischemic disease. 3. Otherwise, no acute abnormality on this motion limited exam. 4. Small bilateral mastoid effusions.  Routine EEG: "No posterior dominant rhythm was seen. EEG showed continuous generalized 3 to 7 Hz theta-delta slowing.  Hyperventilation and photic stimulation were not performed. Of note, study was technically difficult due to significant movement and sweat artifact... Thistechnically difficultstudy is suggestive of moderate diffuse encephalopathy, nonspecific etiology.No seizures or epileptiform discharges were seen throughout the recording."  CSF: Opening pressure 12.5 After initial traumatic tap RBCs remained elevated, but  cleared on each collection 6/1, 612 RBC, 17 RBCs 6/2, RBCs 7100 -> 1785, WBC 4 -> 2 6/3, RBCs 2555 -> 17356, WBC 3 -> 2 Gram stain negative to date for each lumbar puncture Supernatant in each case was colorless ADMARK [id number], CSF pending CSF oligoclonal bands pending  Pre/post NPH testing Limited pre and post testing were able to be completed given patient's significant impairments.  However she did have notable improvements in mobility for example bed mobility improved from max assist with 2 people to minimum assistance.  Sit to stand  mobility improved from max assistance to moderate assistance with the use of a rolling walker, and transfer mobility improved from stand/pivot with max assistance to transferring from bed to chair with walker and moderate assistance.  Additionally her Mini-Mental status evaluation improved from 8-12, specifically she gained 1 additional point in orientation, 1 additional point for short-term memory for three objects, 2 points for being able to follow three-step command (taking paper folding it and putting it on the floor), one-point for being able to read and obey the following (close her eyes), and while she remained unable to write a sentence her written output improved from "X Y X" "I miss you you [illegible]", and she did make an attempt to copy the interlocking pentagons (represented as interlocking squares) which she did not even attempt on the first examination.  Full report to be scanned  Assessment: 55 yo female who was admitted 21 days ago for multiple issues and has had worsening AMS as days progress. There has been no reversible cause identified thus far.She does have enlarged ventricles on CT and she does have symptoms concerning for possible NPH.  Now s/p adequate large volume LP trial with results pending. No significant change on my exam (many hours later due to code strokes and other emergent consults)  Impression:  1. Encephalopathy, multifactorial.  2. Substance abuse with Cocaine. 3. 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

## 2021-02-08 NOTE — Progress Notes (Signed)
PROGRESS NOTE    Carolyn Norton  IWP:[id number] DOB: 1966/05/27 DOA: 01/11/2021 PCP: [name], FNP    Brief Narrative:  55 year old female past medical history of diabetes mellitus type 2, bipolar disorder type I, previous COVID infection, polysubstance abuse that includes cocaine.  She presented from home where she lives with her parents.  Her presenting symptoms included generalized weakness, multiple falls and confusion.  Family confirmed that she had been having the symptoms for at least a month with last use of crack cocaine 1 month prior to presentation.  Ever since that time she had not been doing well.  She would also be having issues with hyperglycemia.  In the ER she was diagnosed with severe dehydration, hyperglycemia and metabolic encephalopathy.  Since admission patient has been evaluated by the psychiatric team who has deemed her alert and oriented and having capacity to make decisions.  Patient is agreeable to placement at a rehab.  Currently she has severe physical deconditioning which was POA and PT is recommending SNF.  Because of her history of polysubstance abuse and lack of funding SNF is not an option.  Family is agreeable to taking her back in home when she is able to ambulate well enough to get back and forth for meals into the bathroom.  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 She DID NOT remember going down for LP on 6/1!!! **I suspect she has an irreversible neurological condition and will not be able to safely return home with her elderly parents and she o/w will require 24/7 care and supervision** Discussed case with Neurology for consideration for lumbar drain vs shunt with neurosurgery, will follow on recs  Anorexia/constipation Likely related to underlying neurocognitive issues  KUB with moderate colonic stool burden-given MOM and had BM 6/1 NS initiated 5/27-given persistent poor intake -6/2 will decrease rate to 50/hr  Diabetes mellitus 2 with hyperglycemia on insulin Hemoglobin A1c 11.0 Continue metformin to 1000 mg BID Continue daily 70/30  insulin 6 units afternoon NPH  Meal coverage discontinued since intake remains poor Continue SSI  Dyslipidemia Continue statin  History of bipolar 1 disorder/polysubstance abuse/cognitive impairment Continue escitalopram, Neurontin and Depakote-LFTs normal Cleared by psych regarding orientation and capacity Functional cognitive evaluation per SLP and Occupational Therapy with significant abnormalities   Other problems: Acute kidney injury on CKD 3a Acute kidney injury resolved after IV fluids Baseline creatinine 1.3  COPD Continue Singulair, nebs as needed, consider adding LABA  GERD Continue PPI  Hypokalemia/hypomagnesemia Resolved   DVT prophylaxis: Lovenox subq Code Status: DNR Family Communication: Pt in room, family not at bedside  Status is: Inpatient  Remains inpatient appropriate because:Inpatient level of care appropriate due to severity of illness   Dispo:  Patient From:    Planned Disposition: To be determined  Medically stable for discharge:  Consultants:   Neurology  Psychiatry  Procedures:   none  Antimicrobials: Anti-infectives (From admission, onward)   None       Subjective: Without complaints this AM  Objective: Vitals:   02/07/21 1342 02/07/21 2033 02/07/21 2111 02/08/21 1338  BP: 122/63 (!) 129/115 119/84 115/70  Pulse: 66 77 75 74  Resp: 16 18 18 18   Temp: 97.9 F (36.6 C) 98.3 F (36.8 C)  98.7 F (37.1 C)  TempSrc:  Oral  Oral  SpO2: 100% 100% 99% 95%  Weight:      Height:        Intake/Output Summary (Last 24 hours) at 02/08/2021 1739 Last data filed at 02/08/2021 0846 Gross per 24 hour  Intake --  Output 1600 ml  Net -1600 ml   Filed Weights   01/11/21 1152  Weight: 99.8 kg    Examination: General exam: Awake, laying in bed, in nad Respiratory system: Normal respiratory effort, no wheezing  Data Reviewed: I have personally reviewed following labs and imaging studies  CBC: Recent Labs  Lab 02/02/21 1016  WBC 5.1  NEUTROABS 2.4  HGB 13.0  HCT 38.4  MCV 86.5  PLT 104*   Basic Metabolic Panel: Recent Labs  Lab 02/02/21 1016  NA 134*  K 3.9  CL 97*  CO2 29  GLUCOSE 294*  BUN 17  CREATININE 0.99  CALCIUM 8.6*   GFR: Estimated Creatinine Clearance: 78.3 mL/min (by C-G formula based on SCr of 0.99 mg/dL). Liver Function Tests: Recent Labs  Lab 02/02/21 1016  AST 36  ALT 25  ALKPHOS 43  BILITOT 0.4  PROT 5.6*  ALBUMIN 2.4*   No results for input(s): LIPASE, AMYLASE in the last 168 hours. No results for input(s): AMMONIA in the last 168 hours. Coagulation Profile: No results for input(s): INR, PROTIME in the last 168 hours. Cardiac Enzymes: No results for input(s): CKTOTAL, CKMB, CKMBINDEX, TROPONINI in the last 168 hours. BNP (last 3 results) No results for input(s): PROBNP in the last 8760 hours. HbA1C: No results for input(s): HGBA1C in the last 72 hours. CBG: Recent Labs  Lab 02/07/21 1610 02/07/21 2143 02/08/21 0801 02/08/21 1159  02/08/21 1704  GLUCAP 120* 98 203* 184* 163*   Lipid Profile: No results for input(s): CHOL, HDL, LDLCALC, TRIG, CHOLHDL, LDLDIRECT in the last 72 hours. Thyroid Function Tests: No results for input(s): TSH, T4TOTAL, FREET4, T3FREE, THYROIDAB in the last 72 hours. Anemia Panel: No results for input(s): VITAMINB12, FOLATE, FERRITIN, TIBC, IRON, RETICCTPCT in the last 72 hours. Sepsis Labs: No results for input(s): PROCALCITON, LATICACIDVEN in the last 168 hours.  Recent Results (from the past 240 hour(s))  CSF culture w Gram Stain     Status: None (Preliminary result)   Collection Time: 02/05/21  2:25 PM   Specimen: PATH Cytology CSF; Cerebrospinal Fluid  Result Value Ref Range Status   Specimen Description CSF  Final   Special Requests NONE  Final   Gram Stain NO WBC SEEN NO ORGANISMS SEEN   Final   Culture   Final    NO GROWTH 3 DAYS Performed at [facility], [address], [place], [place]    Report Status PENDING  Incomplete  CSF culture w Gram Stain     Status: None (Preliminary result)   Collection Time: 02/06/21 11:08 AM   Specimen: PATH Cytology CSF; Cerebrospinal Fluid  Result Value Ref Range Status   Specimen Description CSF  Final   Special Requests NONE  Final   Gram Stain   Final    WBC PRESENT,BOTH PMN AND MONONUCLEAR NO ORGANISMS SEEN CYTOSPIN SMEAR    Culture   Final    NO GROWTH 2 DAYS Performed at [facility], [address] [place], [place] [address]    Report Status PENDING  Incomplete     Radiology Studies: DG FL GUIDED LUMBAR PUNCTURE  Result Date: 02/07/2021 CLINICAL DATA:  55 year old female with suspected NPH, undergoing PT testing but difficulty obtaining large volume tap the prior 2 days. EXAM: DIAGNOSTIC LUMBAR PUNCTURE UNDER FLUOROSCOPIC GUIDANCE COMPARISON:  Fluoroscopic lumbar puncture 02/06/2021, 02/05/2021. Brain MRI 01/13/2021 and earlier. FLUOROSCOPY TIME:  Fluoroscopy Time:  0 minutes 18 seconds Radiation  Exposure Index (if provided by the fluoroscopic device): Not applicable Number of Acquired Spot Images: 0 PROCEDURE: Informed consent was obtained from the patient's family prior to the procedure. A "time-out" was performed. With the patient prone, the lower back was prepped with Betadine. 1% Lidocaine was used for local anesthesia. Lumbar puncture was performed at the L2-L3 level using right side sub laminar technique and a 5 in x 20 gauge needle with return of initially slightly cloudy but ultimately clear CSF. No opening pressure was measured but subjectively the CSF pressure was low, and collection of 26 ml of CSF took nearly 1 hour. The needle was withdrawn, hemostasis noted, and the puncture site appropriately cleaned and dressed. The 4 tubes of CSF were sent to the lab for analysis. The patient tolerated the procedure well and there were no apparent complications. Appropriate post procedural orders were placed on the chart. The patient was returned to the inpatient floor in stable condition for continued treatment. IMPRESSION: Fluoroscopic guided lumbar puncture at L2-L3 with 26 mL of CSF collected. Electronically Signed   By: Odessa Fleming M.D.   On: 02/07/2021 11:56    Scheduled Meds: . aspirin  81 mg Oral Daily  . atorvastatin  20 mg Oral Daily  . cholecalciferol  1,000 Units Oral Daily  . divalproex  500 mg Oral BID  . enoxaparin (LOVENOX) injection  40 mg Subcutaneous Daily  . escitalopram  20 mg Oral Daily  . feeding supplement (GLUCERNA SHAKE)  237 mL Oral TID AC & HS  . gabapentin  300 mg Oral BID  . insulin aspart  0-15 Units Subcutaneous TID WC  . insulin aspart  0-5 Units Subcutaneous QHS  . insulin aspart protamine- aspart  6 Units Subcutaneous Q breakfast  . insulin NPH Human  3 Units Subcutaneous Q supper  . metFORMIN  1,000 mg Oral BID WC  . montelukast  10 mg Oral QHS  . pantoprazole  40 mg Oral Daily  . senna-docusate  2 tablet Oral Daily  . thiamine  100 mg Oral Daily    Continuous Infusions: . sodium chloride 50 mL/hr at 02/06/21 1613     LOS: 27 days   Rickey Barbara, MD [facility] Pager On Amion  If 7PM-7AM, please contact night-coverage 02/08/2021, 5:39 PM

## 2021-02-09 LAB — CSF CULTURE W GRAM STAIN
Culture: NO GROWTH
Culture: NO GROWTH
Gram Stain: NONE SEEN

## 2021-02-09 LAB — GLUCOSE, CAPILLARY
Glucose-Capillary: 296 mg/dL — ABNORMAL HIGH (ref 70–99)
Glucose-Capillary: 219 mg/dL — ABNORMAL HIGH (ref 70–99)
Glucose-Capillary: 86 mg/dL (ref 70–99)
Glucose-Capillary: 178 mg/dL — ABNORMAL HIGH (ref 70–99)

## 2021-02-09 NOTE — Progress Notes (Signed)
NEUROLOGY PROGRESS NOTE  Subjective/major events: 6/1 LP contaminated likely by venous plexus despite two attempts by IR on 6/1, 5 cc obtained 6/2 LP w/ 9 cc fluid obtained due to slow flow 6/3 LP w/ 26 cc fluid obtained, PT testing completed, key details below  -Nurse at bedside today Suszanne Conners reports she had the patient Friday as well as today and confirms patient was markedly improved post large-volume LP drainage trial in terms of mobility and cognition -Patient remains pleasantly confused and has no acute complaints  Exam: Current vital signs: BP 116/72 (BP Location: Left Arm)   Pulse 75   Temp 97.8 F (36.6 C) (Axillary)   Resp 16   Ht 5\' 6"  (1.676 m)   Wt 99.8 kg   LMP 01/26/2017 (Approximate)   SpO2 98%   BMI 35.51 kg/m  Vital signs in last 24 hours: Temp:  [97.8 F (36.6 C)-98.7 F (37.1 C)] 97.8 F (36.6 C) (06/05 0300) Pulse Rate:  [74-75] 75 (06/05 0300) Resp:  [16-18] 16 (06/05 0300) BP: (115-116)/(70-72) 116/72 (06/05 0300) SpO2:  [95 %-98 %] 98 % (06/05 0300)   Physical Exam  Constitutional: Appears well-developed and well-nourished.  Psych: Calm and cooperative, not inappropriate today Eyes: No scleral injection Head: Normocephalic.  Cardiovascular: Perfusing extremities well Respiratory: Effort normal, non-labored breathing GI: Soft.  No distension. There is no tenderness.  Skin: Warm dry and intact visible skin  Neuro:  Mental Status: Alert, oriented to self and hospital but cannot name the hospital, cannot state the year or month.  She is able to follow simple commands and spell world but cannot spell it backwards.  She is able to read the label and state that she is a drinking a Glucerna shake but cannot identify the flavor. Cranial Nerves: II:  Visual fields grossly normal, III,IV, VI: tracks examiner in all directions V,VII: smile symmetric, at times mild right nasolabial fold flattening but symmetric activation, facial light touch  sensation normal bilaterally VIII: hearing normal bilaterally Motor: No pronator drift bilaterally.  Continues to have mild tremor Cerebellar: Intact finger-to-nose, intact heel-to-shin although she performs it laterally and not right on top of her leg Gait on 6/3, not tested today Magnetic gait, shuffles slightly without actually moving her feet at all, does not pick up / position feet or legs well. Apraxia of gait but moves more easily to lower herself into a chair.   Medications:  Current Facility-Administered Medications:  .  0.9 %  sodium chloride infusion, , Intravenous, Continuous, 8/3, NP, Last Rate: 50 mL/hr at 02/06/21 1613, New Bag at 02/06/21 1613 .  acetaminophen (TYLENOL) tablet 650 mg, 650 mg, Oral, Q6H PRN, 04/08/21, DO, 650 mg at 02/05/21 2238 .  albuterol (PROVENTIL) (2.5 MG/3ML) 0.083% nebulizer solution 3 mL, 3 mL, Nebulization, Q6H PRN, Hall, Carole N, DO .  aspirin chewable tablet 81 mg, 81 mg, Oral, Daily, 2239, MD, 81 mg at 02/08/21 0901 .  atorvastatin (LIPITOR) tablet 20 mg, 20 mg, Oral, Daily, Hall, Carole N, DO, 20 mg at 02/08/21 0900 .  bisacodyl (DULCOLAX) suppository 10 mg, 10 mg, Rectal, Daily PRN, 04/10/21, MD .  cholecalciferol (VITAMIN D3) tablet 1,000 Units, 1,000 Units, Oral, Daily, Tyrone Nine, NP, 1,000 Units at 02/08/21 0901 .  dextrose 50 % solution 0-50 mL, 0-50 mL, Intravenous, PRN, 08-19-1973, MD, 50 mL at 01/26/21 1722 .  divalproex (DEPAKOTE) DR tablet 500 mg, 500 mg, Oral, BID, 01/28/21, Prashant K,  MD, 500 mg at 02/08/21 2139 .  enoxaparin (LOVENOX) injection 40 mg, 40 mg, Subcutaneous, Daily, Dow Adolph N, DO, 40 mg at 02/08/21 0902 .  escitalopram (LEXAPRO) tablet 20 mg, 20 mg, Oral, Daily, Hall, Carole N, DO, 20 mg at 02/08/21 0901 .  famotidine (PEPCID) tablet 20 mg, 20 mg, Oral, BID PRN, Russella Dar, NP .  feeding supplement (GLUCERNA SHAKE) (GLUCERNA SHAKE) liquid 237 mL, 237 mL, Oral, TID  AC & HS, Russella Dar, NP, 237 mL at 02/08/21 2140 .  gabapentin (NEURONTIN) capsule 300 mg, 300 mg, Oral, BID, Leroy Sea, MD, 300 mg at 02/08/21 2140 .  insulin aspart (novoLOG) injection 0-15 Units, 0-15 Units, Subcutaneous, TID WC, Tyrone Nine, MD, 3 Units at 02/08/21 1738 .  insulin aspart (novoLOG) injection 0-5 Units, 0-5 Units, Subcutaneous, QHS, Grunz, Ryan B, MD .  insulin aspart protamine- aspart (NOVOLOG MIX 70/30) injection 6 Units, 6 Units, Subcutaneous, Q breakfast, Russella Dar, NP, 6 Units at 02/08/21 0913 .  insulin NPH Human (NOVOLIN N) injection 3 Units, 3 Units, Subcutaneous, Q supper, Jerald Kief, MD, 3 Units at 02/08/21 1740 .  LORazepam (ATIVAN) injection 1 mg, 1 mg, Intravenous, Once PRN, Jazsmin Couse L, MD .  melatonin tablet 3 mg, 3 mg, Oral, QHS PRN, Dow Adolph N, DO, 3 mg at 02/06/21 2151 .  metFORMIN (GLUCOPHAGE) tablet 1,000 mg, 1,000 mg, Oral, BID WC, Russella Dar, NP, 1,000 mg at 02/08/21 1737 .  montelukast (SINGULAIR) tablet 10 mg, 10 mg, Oral, QHS, Hall, Carole N, DO, 10 mg at 02/08/21 2139 .  ondansetron (ZOFRAN) injection 4 mg, 4 mg, Intravenous, Q6H PRN, Dow Adolph N, DO, 4 mg at 02/03/21 1946 .  pantoprazole (PROTONIX) EC tablet 40 mg, 40 mg, Oral, Daily, Dow Adolph N, DO, 40 mg at 02/08/21 0901 .  senna-docusate (Senokot-S) tablet 2 tablet, 2 tablet, Oral, Daily, Tyrone Nine, MD, 2 tablet at 02/08/21 0900 .  thiamine tablet 100 mg, 100 mg, Oral, Daily, Kirby-Graham, Beather Arbour, NP, 100 mg at 02/08/21 0901   Pertinent Data:   Ammonia WNL, B12 - 707, HIV non reactive, RPR negative, creat 1.37 (was 2.8), WBCC 8K, TSH 1.750, UA negative.   CT Head without contrast 01/11/21: personally reviewed by attending physician 1. No acute intracranial abnormality. 2. Similar-appearing prominence of the lateral ventricles may be related to central predominant atrophy, although a component of normal pressure/communicating hydrocephalus  cannot be excluded.  CT Head without contrast 01/29/2021: personally reviewed by attending physician No evidence of acute intracranial hemorrhage or acute infarction. Unchanged prominence of the lateral and third ventricles as compared to the head CT of 01/11/2021. This may be secondary to central predominant atrophy, or communicating/normal pressure hydrocephalus in the appropriate clinical setting. Stable generalized parenchymal atrophy and cerebral white matter chronic small vessel ischemic disease.  MRI Brain 02/02/2021 personally reviewed by attending physician: 1. Similar ventriculomegaly with findings (detailed above) that are suggestive of normal pressure hydrocephalus, particularly given the patient's reported confusion, weakness and gait abnormality with falls. Central prominent cerebral atrophy with ex vacuo ventricular dilation is a differential consideration. 2. Periventricular T2/FLAIR hyperintensity may represent transependymal flow of CSF given conclusion #1 versus chronic microvascular ischemic disease. 3. Otherwise, no acute abnormality on this motion limited exam. 4. Small bilateral mastoid effusions.  Routine EEG: "No posterior dominant rhythm was seen. EEG showed continuous generalized 3 to 7 Hz theta-delta slowing.  Hyperventilation and photic stimulation were not performed. Of note,  study was technically difficult due to significant movement and sweat artifact... Thistechnically difficultstudy is suggestive of moderate diffuse encephalopathy, nonspecific etiology.No seizures or epileptiform discharges were seen throughout the recording."  CSF: Opening pressure 12.5 After initial traumatic tap RBCs remained elevated, but cleared on each collection 6/1, 612 RBC, 17 RBCs 6/2, RBCs 7100 -> 1785, WBC 4 -> 2 6/3, RBCs 2555 -> 17356, WBC 3 -> 2 Gram stain negative to date for each lumbar puncture Supernatant in each case was colorless ADMARK [id number]/[id number], CSF pending CSF  oligoclonal bands pending  Pre/post NPH testing Limited pre and post testing were able to be completed given patient's significant impairments.  However she did have notable improvements in mobility for example bed mobility improved from max assist with 2 people to minimum assistance.  Sit to stand mobility improved from max assistance to moderate assistance with the use of a rolling walker, and transfer mobility improved from stand/pivot with max assistance to transferring from bed to chair with walker and moderate assistance.  Additionally her Mini-Mental status evaluation improved from 8-12, specifically she gained 1 additional point in orientation, 1 additional point for short-term memory for three objects, 2 points for being able to follow three-step command (taking paper folding it and putting it on the floor), one-point for being able to read and obey the following (close her eyes), and while she remained unable to write a sentence her written output improved from "X Y X" "I miss you you [illegible]", and she did make an attempt to copy the interlocking pentagons (represented as interlocking squares) which she did not even attempt on the first examination.  Full report to be scanned  Assessment: 55 yo female who was admitted 21 days ago for multiple issues and has had worsening AMS as days progress. She does have enlarged ventricles on CT and she does have symptoms concerning for possible NPH.  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 Discussed with neurosurgery yesterday, awaiting their evaluation.  In brief, given significant improvement on PT evaluation, specifically marked improvement in Mini-Mental status examination as documented above as well as some improvement in mobility,  I do believe this patient would benefit from neurosurgical evaluation for potential shunting.  While this is typically done on an outpatient basis, the patient has significant barriers given her rapid decline even during this hospitalization and insurance issues that have prevented placement etc.  Currently her functional status is so poor that she cannot be discharged home.  Therefore, I would favor inpatient evaluation for further work-up such as lumbar drainage trial and potentially shunting.  I am concerned that if we wait for standard outpatient follow-up she will continue to have progressive decline that may not be reversible.  Impression:  1. Encephalopathy, multifactorial.  2. Substance abuse with Cocaine. 3. 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

## 2021-02-09 NOTE — Consult Note (Signed)
Reason for Consult: Possible NPH Referring Physician: [facility]  Carolyn Norton is an 55 y.o. female.  HPI: 55 year old female with multiple medical problems presented a month ago with altered mental status and balance difficulty and falls.  Work-up included CT and MRI scan which shows ventriculomegaly with transependymal absorption consistent with either normal pressure hydrocephalus or ventriculomegaly ex vacuo.  Patient had 3 lumbar punctures the only 1 of significant volume was most recent.  Patient denies any change or benefit to her headaches following the lumbar puncture she also denies any baseline with walking or incontinence.  However due to the patient's mental status and confusion not sure how reliable this is.  Did could not find the physical therapy notes on the post lumbar puncture assessment.  CSF studies appear to be clear no evidence of infection Past Medical History:  Diagnosis Date  . Anxiety   . Asthma   . COVID    History of  . Depression   . Diabetes mellitus without complication (HCC)   . Hyperlipidemia   . Hypertension   . Seasonal allergies     Past Surgical History:  Procedure Laterality Date  . BREAST EXCISIONAL BIOPSY Right   . BREAST SURGERY     right breast    Family History  Problem Relation Age of Onset  . Hypertension Mother   . Cancer Mother   . Kidney cancer Mother   . Cancer Father        prostate  . Breast cancer Paternal Grandmother   . Leukemia Brother     Social History:  reports that she has been smoking cigarettes. She has been smoking about 0.50 packs per day. She has never used smokeless tobacco. She reports current drug use. Drug: Cocaine. She reports that she does not drink alcohol.  Allergies:  Allergies  Allergen Reactions  . Erythromycin Other (See Comments)    Childhood reaction    Medications: I have reviewed the patient's current medications.  Results for orders placed or performed during  the hospital encounter of 01/11/21 (from the past 48 hour(s))  CSF cell count with differential     Status: Abnormal   Collection Time: 02/07/21 11:41 AM  Result Value Ref Range   Tube # 1    Color, CSF PINK (A) COLORLESS   Appearance, CSF HAZY (A) CLEAR   Supernatant COLORLESS    RBC Count, CSF 2,555 (H) 0 /cu mm   WBC, CSF 3 0 - 5 /cu mm   Other Cells, CSF TOO FEW TO COUNT, SMEAR AVAILABLE FOR REVIEW     Comment: FEW LYMPHS AND MONOS/MACROPHAGES, RARE NEUTROPHILS AND ESOINOPHILS Performed at [facility], [address] [place], [place] [address]   CSF cell count with differential     Status: Abnormal   Collection Time: 02/07/21 11:41 AM  Result Value Ref Range   Tube # 4    Color, CSF PINK (A) COLORLESS   Appearance, CSF HAZY (A) CLEAR   Supernatant COLORLESS    RBC Count, CSF 1,735 (H) 0 /cu mm   WBC, CSF 2 0 - 5 /cu mm   Other Cells, CSF TOO FEW TO COUNT, SMEAR AVAILABLE FOR REVIEW     Comment: FEW LYMPHS AND MONOS/MACROPHAGES, RARE NEUTROPHILS AND ESOINOPHILS Performed at [facility], [address] [place], [place] [address]   Glucose, capillary     Status: Abnormal   Collection Time: 02/07/21 11:53 AM  Result Value Ref Range  Glucose-Capillary 212 (H) 70 - 99 mg/dL    Comment: Glucose reference range applies only to samples taken after fasting for at least 8 hours.  Glucose, capillary     Status: Abnormal   Collection Time: 02/07/21  4:10 PM  Result Value Ref Range   Glucose-Capillary 120 (H) 70 - 99 mg/dL    Comment: Glucose reference range applies only to samples taken after fasting for at least 8 hours.  Glucose, capillary     Status: None   Collection Time: 02/07/21  9:43 PM  Result Value Ref Range   Glucose-Capillary 98 70 - 99 mg/dL    Comment: Glucose reference range applies only to samples taken after fasting for at least 8 hours.  Glucose, capillary     Status: Abnormal   Collection Time: 02/08/21  8:01 AM  Result Value Ref Range    Glucose-Capillary 203 (H) 70 - 99 mg/dL    Comment: Glucose reference range applies only to samples taken after fasting for at least 8 hours.  Glucose, capillary     Status: Abnormal   Collection Time: 02/08/21 11:59 AM  Result Value Ref Range   Glucose-Capillary 184 (H) 70 - 99 mg/dL    Comment: Glucose reference range applies only to samples taken after fasting for at least 8 hours.  Glucose, capillary     Status: Abnormal   Collection Time: 02/08/21  5:04 PM  Result Value Ref Range   Glucose-Capillary 163 (H) 70 - 99 mg/dL    Comment: Glucose reference range applies only to samples taken after fasting for at least 8 hours.  Glucose, capillary     Status: Abnormal   Collection Time: 02/08/21  7:31 PM  Result Value Ref Range   Glucose-Capillary 152 (H) 70 - 99 mg/dL    Comment: Glucose reference range applies only to samples taken after fasting for at least 8 hours.  Glucose, capillary     Status: Abnormal   Collection Time: 02/09/21  8:01 AM  Result Value Ref Range   Glucose-Capillary 296 (H) 70 - 99 mg/dL    Comment: Glucose reference range applies only to samples taken after fasting for at least 8 hours.    DG FL GUIDED LUMBAR PUNCTURE  Result Date: 02/07/2021 CLINICAL DATA:  55 year old female with suspected NPH, undergoing PT testing but difficulty obtaining large volume tap the prior 2 days. EXAM: DIAGNOSTIC LUMBAR PUNCTURE UNDER FLUOROSCOPIC GUIDANCE COMPARISON:  Fluoroscopic lumbar puncture 02/06/2021, 02/05/2021. Brain MRI 01/13/2021 and earlier. FLUOROSCOPY TIME:  Fluoroscopy Time:  0 minutes 18 seconds Radiation Exposure Index (if provided by the fluoroscopic device): Not applicable Number of Acquired Spot Images: 0 PROCEDURE: Informed consent was obtained from the patient's family prior to the procedure. A "time-out" was performed. With the patient prone, the lower back was prepped with Betadine. 1% Lidocaine was used for local anesthesia. Lumbar puncture was performed at the  L2-L3 level using right side sub laminar technique and a 5 in x 20 gauge needle with return of initially slightly cloudy but ultimately clear CSF. No opening pressure was measured but subjectively the CSF pressure was low, and collection of 26 ml of CSF took nearly 1 hour. The needle was withdrawn, hemostasis noted, and the puncture site appropriately cleaned and dressed. The 4 tubes of CSF were sent to the lab for analysis. The patient tolerated the procedure well and there were no apparent complications. Appropriate post procedural orders were placed on the chart. The patient was returned to the inpatient floor  in stable condition for continued treatment. IMPRESSION: Fluoroscopic guided lumbar puncture at L2-L3 with 26 mL of CSF collected. Electronically Signed   By: Odessa Fleming M.D.   On: 02/07/2021 11:56    Review of Systems  Neurological: Positive for headaches.   Blood pressure 116/72, pulse 75, temperature 97.8 F (36.6 C), temperature source Axillary, resp. rate 16, height 5\' 6"  (1.676 m), weight 99.8 kg, last menstrual period 01/26/2017, SpO2 98 %. Physical Exam Neurological:     Comments: Patient is awake and alert oriented x3 pupils are equal extraocular movements are intact.  Strength is 5 out of 5 upper and lower extremities.  Patient does have some mild to moderate dysmetria right greater than left.  Clearly patient has slow mentation difficulty process even simple tasks     Assessment/Plan: 55 year old female with longstanding history of multiple medical problems including bipolar disorder and cocaine abuse.  Imaging questionable whether is normal pressure hydrocephalus versus ventriculomegaly ex vacuo.  I do feel that it would be beneficial to do another high-volume tap may be with radiology and see if we can get 25 to 30 cc off with physical therapy assessment before and after.  If we can document a reliable and repetitive benefit to high-volume tap, then shunting may be appropriate.  I  will be heading out of town this week but will discuss with one of my partners who will follow-up on results of lumbar puncture.    40 02/09/2021, 9:32 AM

## 2021-02-09 NOTE — Progress Notes (Signed)
PROGRESS NOTE    Carolyn Norton  JKD:[id number] DOB: 12/25/1965 DOA: 01/11/2021 PCP: [name], FNP    Brief Narrative:  55 year old female past medical history of diabetes mellitus type 2, bipolar disorder type I, previous COVID infection, polysubstance abuse that includes cocaine.  She presented from home where she lives with her parents.  Her presenting symptoms included generalized weakness, multiple falls and confusion.  Family confirmed that she had been having the symptoms for at least a month with last use of crack cocaine 1 month prior to presentation.  Ever since that time she had not been doing well.  She would also be having issues with hyperglycemia.  In the ER she was diagnosed with severe dehydration, hyperglycemia and metabolic encephalopathy.  Since admission patient has been evaluated by the psychiatric team who has deemed her alert and oriented and having capacity to make decisions.  Patient is agreeable to placement at a rehab.  Currently she has severe physical deconditioning which was POA and PT is recommending SNF.  Because of her history of polysubstance abuse and lack of funding SNF is not an option.  Family is agreeable to taking her back in home when she is able to ambulate well enough to get back and forth for meals into the bathroom.  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 She DID NOT remember going down for LP on 6/1!!! **I suspect she has an irreversible neurological condition and will not be able to safely return home with her elderly parents and she o/w will require 24/7 care and supervision** Discussed with Neurology regarding possibility of shunting. Pt seen by neurosurgery. Consideration for another large volume LP and if there is clinical improvement, then shunt placement may be beneficial   Anorexia/constipation Likely related to underlying neurocognitive issues  KUB with moderate colonic stool burden-given MOM and had BM 6/1 NS initiated 5/27-given persistent poor intake -6/2 will decrease rate to 50/hr  Diabetes mellitus 2 with hyperglycemia on insulin Hemoglobin A1c 11.0 Continue metformin to 1000 mg BID Continue daily 70/30  insulin 6 units afternoon NPH  Meal coverage discontinued since intake remains poor Continue SSI  Dyslipidemia Continue statin  History of bipolar 1 disorder/polysubstance abuse/cognitive impairment Continue escitalopram, Neurontin and Depakote-LFTs normal Cleared by psych regarding orientation and capacity Functional cognitive evaluation per SLP and Occupational Therapy with significant abnormalities   Other problems: Acute kidney injury on CKD 3a Acute kidney injury resolved after IV fluids Baseline creatinine 1.3  COPD Continue Singulair, nebs as needed, consider adding LABA  GERD Continue PPI  Hypokalemia/hypomagnesemia Resolved   DVT prophylaxis: Lovenox subq Code Status: DNR Family Communication: Pt in room, family not at bedside  Status is: Inpatient  Remains inpatient appropriate because:Inpatient level of care appropriate due to severity of illness   Dispo:  Patient From:  Planned Disposition: To be determined  Medically stable for discharge:           Consultants:   Neurology  Psychiatry  Neurosurgery  Procedures:   none  Antimicrobials: Anti-infectives (From admission, onward)   None      Subjective: Pleasant and without complaints  Objective: Vitals:   02/07/21 2033 02/07/21 2111 02/08/21 1338 02/09/21 0300  BP: (!) 129/115 119/84 115/70 116/72  Pulse: 77 75 74 75  Resp: 18 18 18 16   Temp: 98.3 F (36.8 C)  98.7 F (37.1 C) 97.8 F (36.6 C)  TempSrc: Oral  Oral Axillary  SpO2: 100% 99% 95% 98%  Weight:      Height:        Intake/Output Summary (Last 24 hours) at 02/09/2021 1700 Last data filed at 02/09/2021 0800 Gross per 24 hour  Intake 250 ml  Output 850 ml  Net -600 ml   Filed Weights   01/11/21 1152  Weight: 99.8 kg    Examination: General exam: Conversant, in no acute distress Respiratory system: normal chest rise, clear, no audible wheezing  Data Reviewed: I have personally reviewed following labs and imaging studies  CBC: No results for input(s): WBC, NEUTROABS, HGB, HCT, MCV, PLT in the last 168 hours. Basic Metabolic Panel: No results for input(s): NA, K, CL, CO2, GLUCOSE, BUN, CREATININE, CALCIUM, MG, PHOS in the last 168 hours. GFR: Estimated Creatinine Clearance: 78.3 mL/min (by C-G formula based on SCr of 0.99 mg/dL). Liver Function Tests: No results for input(s): AST, ALT, ALKPHOS, BILITOT, PROT, ALBUMIN in the last 168 hours. No results for input(s): LIPASE, AMYLASE in the last 168 hours. No results for input(s): AMMONIA in the last 168 hours. Coagulation Profile: No results for input(s): INR, PROTIME in the last 168 hours. Cardiac Enzymes: No results for input(s): CKTOTAL, CKMB, CKMBINDEX, TROPONINI in the last 168 hours. BNP (last 3 results) No results for input(s): PROBNP in the last 8760 hours. HbA1C: No results for input(s): HGBA1C in the last 72 hours. CBG: Recent Labs  Lab  02/08/21 1704 02/08/21 1931 02/09/21 0801 02/09/21 1205 02/09/21 1601  GLUCAP 163* 152* 296* 219* 86   Lipid Profile: No results for input(s): CHOL, HDL, LDLCALC, TRIG, CHOLHDL, LDLDIRECT in the last 72 hours. Thyroid Function Tests: No results for input(s): TSH, T4TOTAL, FREET4, T3FREE, THYROIDAB in the last 72 hours. Anemia Panel: No results for input(s): VITAMINB12, FOLATE, FERRITIN, TIBC, IRON, RETICCTPCT in the last 72 hours. Sepsis Labs: No results for input(s): PROCALCITON, LATICACIDVEN in the last 168 hours.  Recent Results (from the past 240 hour(s))  CSF culture w Gram Stain     Status: None   Collection Time: 02/05/21  2:25 PM   Specimen: PATH Cytology CSF; Cerebrospinal Fluid  Result Value Ref Range Status   Specimen Description CSF  Final   Special Requests NONE  Final   Gram Stain NO WBC SEEN NO ORGANISMS SEEN   Final   Culture   Final    NO GROWTH 3 DAYS Performed at [facility], [address], [place], [place]    Report Status 02/09/2021 FINAL  Final  CSF culture w Gram Stain     Status: None (Preliminary result)   Collection Time: 02/06/21 11:08 AM   Specimen: PATH Cytology CSF; Cerebrospinal Fluid  Result Value Ref Range Status   Specimen Description CSF  Final   Special Requests NONE  Final   Gram Stain   Final    WBC PRESENT,BOTH  PMN AND MONONUCLEAR NO ORGANISMS SEEN CYTOSPIN SMEAR    Culture   Final    NO GROWTH 3 DAYS Performed at [facility], [address], [place], [place] [address]    Report Status PENDING  Incomplete     Radiology Studies: No results found.  Scheduled Meds: . aspirin  81 mg Oral Daily  . atorvastatin  20 mg Oral Daily  . cholecalciferol  1,000 Units Oral Daily  . divalproex  500 mg Oral BID  . enoxaparin (LOVENOX) injection  40 mg Subcutaneous Daily  . escitalopram  20 mg Oral Daily  . feeding supplement (GLUCERNA SHAKE)  237 mL Oral TID AC & HS  . gabapentin  300 mg Oral BID  . insulin  aspart  0-15 Units Subcutaneous TID WC  . insulin aspart  0-5 Units Subcutaneous QHS  . insulin aspart protamine- aspart  6 Units Subcutaneous Q breakfast  . insulin NPH Human  3 Units Subcutaneous Q supper  . metFORMIN  1,000 mg Oral BID WC  . montelukast  10 mg Oral QHS  . pantoprazole  40 mg Oral Daily  . senna-docusate  2 tablet Oral Daily  . thiamine  100 mg Oral Daily   Continuous Infusions: . sodium chloride 50 mL/hr at 02/06/21 1613     LOS: 28 days   Rickey Barbara, MD [facility] Pager On Amion  If 7PM-7AM, please contact night-coverage 02/09/2021, 5:00 PM

## 2021-02-10 DIAGNOSIS — G912 (Idiopathic) normal pressure hydrocephalus: Secondary | ICD-10-CM

## 2021-02-10 LAB — GLUCOSE, CAPILLARY
Glucose-Capillary: 318 mg/dL — ABNORMAL HIGH (ref 70–99)
Glucose-Capillary: 245 mg/dL — ABNORMAL HIGH (ref 70–99)
Glucose-Capillary: 92 mg/dL (ref 70–99)
Glucose-Capillary: 71 mg/dL (ref 70–99)

## 2021-02-10 LAB — OLIGOCLONAL BANDS, CSF + SERM

## 2021-02-10 MED ORDER — INSULIN ASPART PROT & ASPART (70-30 MIX) 100 UNIT/ML ~~LOC~~ SUSP
6.0000 [IU] | Freq: Every day | SUBCUTANEOUS | Status: DC
  Administered 2021-02-10 – 2021-02-11 (×2): 6 [IU] via SUBCUTANEOUS

## 2021-02-10 MED ORDER — INSULIN ASPART 100 UNIT/ML IJ SOLN
0.0000 [IU] | Freq: Three times a day (TID) | INTRAMUSCULAR | Status: DC
  Administered 2021-02-10: 3 [IU] via SUBCUTANEOUS
  Administered 2021-02-11: 5 [IU] via SUBCUTANEOUS
  Administered 2021-02-11: 9 [IU] via SUBCUTANEOUS

## 2021-02-10 MED ORDER — INSULIN ASPART 100 UNIT/ML IJ SOLN: 0.0000 [IU] | Freq: Every day | INTRAMUSCULAR | Status: DC

## 2021-02-10 MED ORDER — INSULIN ASPART PROT & ASPART (70-30 MIX) 100 UNIT/ML ~~LOC~~ SUSP
8.0000 [IU] | Freq: Every day | SUBCUTANEOUS | Status: DC
  Administered 2021-02-11 – 2021-02-12 (×2): 8 [IU] via SUBCUTANEOUS

## 2021-02-10 MED ORDER — INSULIN ASPART PROT & ASPART (70-30 MIX) 100 UNIT/ML ~~LOC~~ SUSP: 3.0000 [IU] | Freq: Every day | SUBCUTANEOUS | Status: DC

## 2021-02-10 NOTE — Progress Notes (Signed)
Inpatient Diabetes Program Recommendations  AACE/ADA: New Consensus Statement on Inpatient Glycemic Control (2015)  Target Ranges:  Prepandial:   less than 140 mg/dL      Peak postprandial:   less than 180 mg/dL (1-2 hours)      Critically ill patients:  140 - 180 mg/dL   Lab Results  Component Value Date   GLUCAP 318 (H) 02/10/2021   HGBA1C 11.0 (H) 01/12/2021    Review of Glycemic Control Results for Carolyn Norton, Carolyn Norton (MRN [id number]) as of 02/10/2021 10:27  Ref. Range 02/09/2021 08:01 02/09/2021 12:05 02/09/2021 16:01 02/09/2021 21:52 02/10/2021 08:14  Glucose-Capillary Latest Ref Range: 70 - 99 mg/dL 009 (H) 233 (H) 86 007 (H) 318 (H)   Diabetes history: DM 2 Outpatient Diabetes medications: Lantus 45 units q HS Current orders for Inpatient glycemic control:  Novolog 70/30- 6 units in the AM and Novolog 70/30 3 units with supper Novolog moderate tid with meals and HS Inpatient Diabetes Program Recommendations:   Please reduce Novolog correction to sensitive tid with meals.  Increase Novolog 70/30 to 8 units int he AM and increase PM dose of 70/30 to 6 units.     Thanks,  Beryl Meager, RN, BC-ADM Inpatient Diabetes Coordinator Pager [phone] (8a-5p)

## 2021-02-10 NOTE — Progress Notes (Signed)
Brief Neurology Note  Patient evaluated by NSU yesterday. They were unable to locate PT post-LP assessment and thus recommended repeat large volume LP. PT post-assessment now scanned into chart under media. Patient did have significant improvement although transient after large volume tap that I feel warrants shunt or at least lumbar drain trial. Moreover she has very difficult anatomy for repeat LPs, and underwent 3 IR procedures to achieve a single success. I will rediscuss with NSU tomorrow.   Bing Neighbors, MD [facility] [phone] If 7pm- 7am, please page neurology on call as listed in AMION.

## 2021-02-10 NOTE — TOC Progression Note (Signed)
Transition of Care [facility]) - Progression Note    Patient Details  Name: Carolyn Norton MRN: [id number] Date of Birth: [birth year]  Transition of Care [facility]) CM/SW Contact  Janae Bridgeman, RN Phone Number: 02/10/2021, 10:39 AM  Clinical Narrative:    Case management spoke with [name], NP and the patient is being followed by neurology and neurosurgery MD at this time and is not medically stable for discharge.  Financial counseling is working with the patient's mother for possible consideration for Medicaid / disability.  The patient will need SNF placement and currently has no bed offers at this time.  CM and MSW will continue to follow for transitions of care needs.   Expected Discharge Plan: Skilled Nursing Facility Barriers to Discharge: Family Issues,Continued Medical Work up  Expected Discharge Plan and Services Expected Discharge Plan: Skilled Nursing Facility In-house Referral: Clinical Social Work Discharge Planning Services: CM Consult Post Acute Care Choice: Skilled Nursing Facility Living arrangements for the past 2 months: Single Family Home Expected Discharge Date: 01/16/21               DME Arranged: 3-N-1,Walker rolling DME Agency: [employer] Date DME Agency Contacted: 01/20/21 Time DME Agency Contacted: 1435 Representative spoke with at DME Agency: [name]             Social Determinants of Health (SDOH) Interventions    Readmission Risk Interventions Readmission Risk Prevention Plan 01/14/2021  Transportation Screening Complete  PCP or Specialist Appt within 3-5 Days (No Data)  Social Work Consult for Recovery Care Planning/Counseling Complete  Palliative Care Screening Not Applicable  Medication Review Ocean[facility]) Referral to Pharmacy  Some recent data might be hidden

## 2021-02-10 NOTE — Progress Notes (Signed)
   Palliative Medicine Inpatient Follow Up Note  Reason for consult:  Goals of Care "Patient with bipolar and substance abuse.  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  She is not eating. Current plan if unable to get placed would be to go home with elderly parents."  HPI:  Per intake H&P --> 55 year old female past medical history of diabetes mellitus type 2, bipolar disorder type I, previous COVID infection, polysubstance abuse that includes cocaine.  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  She is not eating or drinking sufficiently. Palliative care has been asked to get involved to further discuss goals of care in the setting of progressive failure to thrive.  Today's Discussion (02/10/2021):  *Please note that this is a verbal dictation therefore any spelling or grammatical errors are due to the "[employer]" system interpretation.   Chart reviewed.  I met with Jayma at bedside this morning, she was alert and oriented to person and place which was an improvement from prior.   Reviewed the plan with her in regards to receiving another LP and the possibility of a shunt moving forward.   PT/OT Evaluations pending at this time.  Ongoing support.  Objective Assessment: Vital Signs Vitals:   02/09/21 2150 02/10/21 0328  BP: 129/67 (!) 151/85  Pulse: 76 74  Resp: 20 18  Temp: 98 F (36.7 C) 97.8 F (36.6 C)  SpO2: 94% 96%    Intake/Output Summary (Last 24 hours) at 02/10/2021 1054 Last data filed at 02/09/2021 1700 Gross per 24 hour  Intake 500 ml  Output 250 ml  Net 250 ml   Last Weight  Most recent update: 01/11/2021 11:52 AM   Weight  99.8 kg (220 lb)           Gen:  Caucasian F in NAD HEENT: moist mucous membranes CV: Regular rate and rhythm  PULM: clear to auscultation bilaterally  ABD: soft/nontender  EXT: No edema  Neuro: Awake, alert  SUMMARY OF  RECOMMENDATIONS DNAR/DNI, No G-Tube  Gold DNR on the chart  Plan for another LP to see if LT shunt may be needed  Goals: Improve present condition. Per patients mother long term placement - she realizes that Yaneisy may continue to decline and if this occurs additional [place] conversations will be needed  Ongoing incremental palliative care support  Time Spent: 15 Greater than 50% of the time was spent in counseling and coordination of care ______________________________________________________________________________________ [place] Team Team Cell Phone: [phone] Please utilize secure chat with additional questions, if there is no response within 30 minutes please call the above phone number  Palliative Medicine Team providers are available by phone from 7am to 7pm daily and can be reached through the team cell phone.  Should this patient require assistance outside of these hours, please call the patient's attending physician.

## 2021-02-10 NOTE — Progress Notes (Addendum)
[facility] PROGRESS NOTE  Carolyn Norton FAO:[id number] DOB: 1966/05/06 DOA: 01/11/2021 PCP: [name], FNP  Status: Remains inpatient appropriate because:Unsafe d/c plan  Dispo:  Patient From:  Home  Planned Disposition: To be determinedHome-family has agreed to take patient back when she is medically stable and able to mobilize independently with minimal assistance.  Medically stable for discharge:  NO  Barriers to DC: Continues to require significant assistance with mobility.             Difficult to place: Yes  Level of care: Med-Surg  Code Status: DNR Family Communication: 5/30 father-regarding possible NPH and dc planning diagnosis-6/3 updated by TOC re dc plans DVT prophylaxis: Lovenox COVID vaccination status: Unknown   HPI: 55 year old female past medical history of diabetes mellitus type 2, bipolar disorder type I, previous COVID infection, polysubstance abuse that includes cocaine.  She presented from home where she lives with her parents.  Her presenting symptoms included generalized weakness, multiple falls and confusion.  Family confirmed that she had been having the symptoms for at least a month with last use of crack cocaine 1 month prior to presentation.  Ever since that time she had not been doing well.  She would also be having issues with hyperglycemia.  In the ER she was diagnosed with severe dehydration, hyperglycemia and metabolic encephalopathy.  Since admission patient has been evaluated by the psychiatric team who has deemed her alert and oriented and having capacity to make decisions.  Patient is agreeable to placement at a rehab.  Currently she has severe physical deconditioning which was POA and PT is recommending SNF.  Because of her history of polysubstance abuse and lack of funding SNF is not an option.  Family is agreeable to taking her back in home when she is able to ambulate well enough to get back and forth for meals into the  bathroom.  Subjective: Alert and pleasant.  Oriented to name and place.  Partially to year noting she initially said 2002 but then when asked if it was 2022 she stated yes.  Eating much better than previous post large-volume LP.  Objective: Vitals:   02/09/21 2150 02/10/21 0328  BP: 129/67 (!) 151/85  Pulse: 76 74  Resp: 20 18  Temp: 98 F (36.7 C) 97.8 F (36.6 C)  SpO2: 94% 96%    Intake/Output Summary (Last 24 hours) at 02/10/2021 0753 Last data filed at 02/09/2021 1700 Gross per 24 hour  Intake 750 ml  Output 1100 ml  Net -350 ml   Filed Weights   01/11/21 1152  Weight: 99.8 kg    Exam:  Constitutional: Alert, calm, no acute distress and appears to be comfortable Respiratory: Bilateral lung sounds are clear to auscultation and she remained stable on room air. Cardiac: Heart sounds are normal, she remains normotensive, no peripheral edema. Abdomen: LBM 6/4, eating better post large-volume LP last week, bowel sounds are present Neurologic: CN 2-12 grossly intact. Sensation intact, Strength 4/5 x all 4 extremities.   Psychiatric: Alert and oriented times name and place.  Partially to year.  Pleasant affect.    Assessment/Plan: Acute problems: Acute metabolic encephalopathy (multifactorial): Severe dehydration in context of poorly controlled diabetes mellitus with hyperglycemia Initially felt to be secondary to cocaine withdrawal and poor nutrition POA contributed-of note family later confirmed patient with significant change in ability to manage care and ambulate after COVID infection. Acute encephalopathy resolved but persistent cognitive and gait issues.  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 On 6/3 after large-volume LP patient did gain some improvement in mobility and mentation. Neurosurgery recommended an additional large-volume LP -if continues to show gains in cognition and mobility consideration will be given to placement of VP shunt.  Anorexia/constipation Iimprovemed in intake and appetite seen after large-volume LP Decrease IV fluids to KVO  Diabetes mellitus 2 with hyperglycemia on insulin Hemoglobin A1c 11.0 Continue metformin to 1000 mg BID Increase daily 70/30 insulin 8 units and 6 units with supper  Meal coverage discontinued since intake remains poor Continue SSI  Dyslipidemia Continue statin  History of bipolar 1 disorder/polysubstance abuse/cognitive impairment Continue escitalopram, Neurontin and Depakote Cleared by psych regarding orientation and capacity Functional cognitive evaluation per SLP and Occupational Therapy with significant abnormalities Will need to have repeat cognitive evaluations after VP shunt placed and sure no residual abnormalities   Other problems: Acute kidney injury on CKD 3a Acute kidney injury resolved after IV fluids Baseline creatinine 1.3  COPD Continue Singulair, nebs as needed, consider adding LABA  GERD Continue PPI  Hypokalemia/hypomagnesemia Resolved  Data Reviewed: Basic Metabolic Panel: No results for input(s): NA, K, CL, CO2, GLUCOSE, BUN, CREATININE, CALCIUM, MG, PHOS in the last 168 hours. Liver Function Tests: No results for input(s): AST, ALT, ALKPHOS, BILITOT, PROT, ALBUMIN in the last 168 hours. No results for input(s): LIPASE, AMYLASE in the last 168 hours. No results for input(s): AMMONIA in the last 168 hours. CBC: No results for input(s): WBC, NEUTROABS, HGB, HCT, MCV, PLT in the last 168 hours. Cardiac Enzymes: No results for input(s): CKTOTAL, CKMB, CKMBINDEX, TROPONINI in the last 168 hours. BNP (last 3 results) Recent Labs    01/14/21 0238 01/15/21 0230  01/16/21 0201  BNP 177.8* 115.9* 40.9    ProBNP (last 3 results) No results for input(s): PROBNP in the last 8760 hours.  CBG: Recent Labs  Lab 02/08/21 1931 02/09/21 0801 02/09/21 1205 02/09/21 1601 02/09/21 2152  GLUCAP 152* 296* 219* 86 178*    Recent Results (from the past 240 hour(s))  CSF culture w Gram Stain     Status: None   Collection Time: 02/05/21  2:25 PM   Specimen: PATH Cytology CSF; Cerebrospinal Fluid  Result Value Ref Range Status   Specimen Description CSF  Final   Special Requests NONE  Final   Gram Stain NO WBC SEEN NO ORGANISMS SEEN   Final   Culture   Final    NO GROWTH 3 DAYS Performed at [facility], [address] [place], [place] [address]    Report Status 02/09/2021 FINAL  Final  CSF culture w Gram Stain     Status: None   Collection Time: 02/06/21 11:08 AM   Specimen: PATH Cytology CSF; Cerebrospinal Fluid  Result Value Ref Range Status   Specimen Description CSF  Final   Special Requests NONE  Final   Gram Stain   Final    WBC PRESENT,BOTH PMN AND MONONUCLEAR NO ORGANISMS SEEN CYTOSPIN SMEAR    Culture   Final    NO GROWTH 3 DAYS Performed at [facility], [address] [place], [place] [address]    Report Status 02/09/2021 FINAL  Final     Studies: No results found.  Scheduled Meds: . aspirin  81 mg  Oral Daily  . atorvastatin  20 mg Oral Daily  . cholecalciferol  1,000 Units Oral Daily  . divalproex  500 mg Oral BID  . enoxaparin (LOVENOX) injection  40 mg Subcutaneous Daily  . escitalopram  20 mg Oral Daily  . feeding supplement (GLUCERNA SHAKE)  237 mL Oral TID AC & HS  . gabapentin  300 mg Oral BID  . insulin aspart  0-15 Units Subcutaneous TID WC  . insulin aspart  0-5 Units Subcutaneous QHS  . insulin aspart protamine- aspart  6 Units Subcutaneous Q breakfast  . insulin NPH Human  3 Units Subcutaneous Q supper  . metFORMIN  1,000 mg Oral BID WC  . montelukast  10 mg Oral QHS  . pantoprazole  40  mg Oral Daily  . senna-docusate  2 tablet Oral Daily  . thiamine  100 mg Oral Daily   Continuous Infusions: . sodium chloride 50 mL/hr at 02/06/21 1613    Principal Problem:   Bipolar 1 disorder, depressed, full remission (HCC) Active Problems:   Bipolar 1 disorder (HCC)   Generalized weakness   Consultants:  Psychiatry  Procedures:  None  Antibiotics: Anti-infectives (From admission, onward)   None       Time spent: 35 minutes    [name] ANP  [facility] 7 am - 330 pm/M-F for direct patient care and secure chat Please refer to Amion for contact info 29  days

## 2021-02-11 ENCOUNTER — Other Ambulatory Visit: Payer: Self-pay

## 2021-02-11 DIAGNOSIS — G912 (Idiopathic) normal pressure hydrocephalus: Secondary | ICD-10-CM | POA: Diagnosis present

## 2021-02-11 DIAGNOSIS — R4189 Other symptoms and signs involving cognitive functions and awareness: Secondary | ICD-10-CM | POA: Diagnosis present

## 2021-02-11 DIAGNOSIS — E1165 Type 2 diabetes mellitus with hyperglycemia: Secondary | ICD-10-CM

## 2021-02-11 DIAGNOSIS — Z794 Long term (current) use of insulin: Secondary | ICD-10-CM

## 2021-02-11 DIAGNOSIS — E785 Hyperlipidemia, unspecified: Secondary | ICD-10-CM | POA: Diagnosis present

## 2021-02-11 LAB — GLUCOSE, CAPILLARY
Glucose-Capillary: 353 mg/dL — ABNORMAL HIGH (ref 70–99)
Glucose-Capillary: 280 mg/dL — ABNORMAL HIGH (ref 70–99)
Glucose-Capillary: 105 mg/dL — ABNORMAL HIGH (ref 70–99)
Glucose-Capillary: 160 mg/dL — ABNORMAL HIGH (ref 70–99)

## 2021-02-11 LAB — VITAMIN B1: Vitamin B1 (Thiamine): 151.6 nmol/L (ref 66.5–200.0)

## 2021-02-11 NOTE — Progress Notes (Signed)
Neurology Progress Note  S: Patient thinks her head is clearer. She is up in chair eating breakfast. Per RN (who has had her x 4 days), states that patient was noticeably better with alertness and cognition after last large volume LP drain of CSF.  Her symptoms wax and wane.  Patient continues to have bowel and bladder incontinence. She continues not to walk due to lack of understanding or propelling her feet.   O: Current vital signs: BP 123/74 (BP Location: Left Arm)   Pulse 80   Temp 97.8 F (36.6 C)   Resp 16   Ht 5\' 6"  (1.676 m)   Wt 99.8 kg   LMP 01/26/2017 (Approximate)   SpO2 93%   BMI 35.51 kg/m  Vital signs in last 24 hours: Temp:  [97.7 F (36.5 C)-98.1 F (36.7 C)] 97.8 F (36.6 C) (06/07 0416) Pulse Rate:  [73-83] 80 (06/07 0416) Resp:  [16-20] 16 (06/07 0416) BP: (120-126)/(70-74) 123/74 (06/07 0416) SpO2:  [91 %-97 %] 93 % (06/07 0416)  GENERAL: Awake, alert in NAD. HEENT: Normocephalic and atraumatic. LUNGS: Normal respiratory effort.  Ext: warm  NEURO:  Mental Status: Alert and oriented to self, place, but not to time. Speech/Language: speech is without aphasia or dysarthria.  Bradyphrenic and easily distractible, but this is slightly improved over NPs last exam on 02/02/21.   Cranial Nerves:  PERRL. Sensation is intact to light touch. Smile is symmetrical. Hearing intact to voice. Moves all extremities on command, is eating breakfast without noted tremors in upper extremities.   Medications  Current Facility-Administered Medications:  .  0.9 %  sodium chloride infusion, , Intravenous, Continuous, 02/04/21, NP, Last Rate: 10 mL/hr at 02/10/21 1230, Rate Change at 02/10/21 1230 .  acetaminophen (TYLENOL) tablet 650 mg, 650 mg, Oral, Q6H PRN, 04/12/21, DO, 650 mg at 02/10/21 2322 .  albuterol (PROVENTIL) (2.5 MG/3ML) 0.083% nebulizer solution 3 mL, 3 mL, Nebulization, Q6H PRN, Hall, Carole N, DO .  atorvastatin (LIPITOR) tablet 20 mg, 20 mg,  Oral, Daily, Hall, Carole N, DO, 20 mg at 02/11/21 0900 .  bisacodyl (DULCOLAX) suppository 10 mg, 10 mg, Rectal, Daily PRN, 04/13/21, MD .  cholecalciferol (VITAMIN D3) tablet 1,000 Units, 1,000 Units, Oral, Daily, Tyrone Nine, NP, 1,000 Units at 02/11/21 0901 .  dextrose 50 % solution 0-50 mL, 0-50 mL, Intravenous, PRN, 08-19-1973, MD, 50 mL at 01/26/21 1722 .  divalproex (DEPAKOTE) DR tablet 500 mg, 500 mg, Oral, BID, 01/28/21, MD, 500 mg at 02/11/21 0902 .  enoxaparin (LOVENOX) injection 40 mg, 40 mg, Subcutaneous, Daily, 04/13/21 N, DO, 40 mg at 02/10/21 04/12/21 .  escitalopram (LEXAPRO) tablet 20 mg, 20 mg, Oral, Daily, Hall, Carole N, DO, 20 mg at 02/11/21 0905 .  famotidine (PEPCID) tablet 20 mg, 20 mg, Oral, BID PRN, 04/13/21 L, NP .  feeding supplement (GLUCERNA SHAKE) (GLUCERNA SHAKE) liquid 237 mL, 237 mL, Oral, TID AC & HS, [name], NP, 237 mL at 02/11/21 0907 .  gabapentin (NEURONTIN) capsule 300 mg, 300 mg, Oral, BID, 04/13/21, MD, 300 mg at 02/11/21 0901 .  insulin aspart (novoLOG) injection 0-5 Units, 0-5 Units, Subcutaneous, QHS, 04/13/21 L, NP .  insulin aspart (novoLOG) injection 0-9 Units, 0-9 Units, Subcutaneous, TID WC, [name], NP, 9 Units at 02/11/21 0906 .  insulin aspart protamine- aspart (NOVOLOG MIX 70/30) injection 6 Units, 6 Units, Subcutaneous, Q supper, 04/13/21,  NP, 6 Units at 02/10/21 1656 .  insulin aspart protamine- aspart (NOVOLOG MIX 70/30) injection 8 Units, 8 Units, Subcutaneous, Q breakfast, Russella Dar, NP, 8 Units at 02/11/21 0908 .  LORazepam (ATIVAN) injection 1 mg, 1 mg, Intravenous, Once PRN, Bhagat, Srishti L, MD .  melatonin tablet 3 mg, 3 mg, Oral, QHS PRN, Dow Adolph N, DO, 3 mg at 02/10/21 2323 .  metFORMIN (GLUCOPHAGE) tablet 1,000 mg, 1,000 mg, Oral, BID WC, [name] L, NP, 1,000 mg at 02/11/21 0900 .  montelukast (SINGULAIR) tablet 10 mg, 10 mg, Oral, QHS, Hall,  Carole N, DO, 10 mg at 02/10/21 2300 .  ondansetron (ZOFRAN) injection 4 mg, 4 mg, Intravenous, Q6H PRN, Dow Adolph N, DO, 4 mg at 02/03/21 1946 .  pantoprazole (PROTONIX) EC tablet 40 mg, 40 mg, Oral, Daily, Dow Adolph N, DO, 40 mg at 02/11/21 0901 .  senna-docusate (Senokot-S) tablet 2 tablet, 2 tablet, Oral, Daily, Tyrone Nine, MD, 2 tablet at 02/11/21 0901 .  thiamine tablet 100 mg, 100 mg, Oral, Daily, Kirby-Graham, Beather Arbour, NP, 100 mg at 02/11/21 0901   Assessment and Plan: 1. ? 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 NSU is on board and Dr. Selina Cooley to discuss plan with NSU re: placement of shunt vs. lumbar drain trial.  2. Medical issues per hospitalist.   Pt seen by Jimmye Norman, MSN, APN-BC/Nurse Practitioner/Neuro and later by MD. Note and plan to be edited as needed by MD.  Pager: [phone]

## 2021-02-11 NOTE — TOC Progression Note (Signed)
Transition of Care [facility]) - Progression Note    Patient Details  Name: Carolyn Norton MRN: [id number] Date of Birth: April 04, 1966  Transition [facility]) CM/SW [place], RN Phone Number: 02/11/2021, 11:28 AM  Clinical Narrative:    Case management met with the patient at the bedside in regards to transitions of care.  The patient is being followed by Neurology and Neurosurgery at this time for evaluation for possible placement of shunt versus lumbar drain trial.  The patient was able to recall that her aunt came to visit her yesterday but was not able to state correct time, place or situational awareness.  The patient states that she remembers being admitted to the hospital due to "memory problems".  The patient's family is not agreeable to patient returning to live with them until she is able to mobilize safely to bathroom and assist with her care.  The patient has pending neurological procedures at this time and PT continues to work with patient.   CM and MSW will continue to follow the patient for transitions of care needs.  The patient will need 24-hour supervision and care either at home with family or SNF placement.  Financial counseling is following the patient at this time to determine if patient is able to quality for disability.   Expected Discharge Plan: Skilled Nursing Facility Barriers to Discharge: Family Issues,Continued Medical Work up  Expected Discharge Plan and Services Expected Discharge Plan: [place] In-house Referral: Clinical Social Work Discharge Planning Services: CM Consult Post Acute Care Choice: [place] arrangements for the past 2 months: Single Family Home Expected Discharge Date: 01/16/21               DME Arranged: 3-N-1,Walker rolling DME Agency: [employer] Date DME Agency Contacted: 01/20/21 Time DME Agency Contacted: [id number] Representative spoke with at DME Agency: [place] Determinants of Health ([place]) Interventions    Readmission Risk Interventions Readmission Risk Prevention Plan 01/14/2021  Transportation Screening Complete  PCP or Specialist Appt within 3-5 Days (No Data)  Social Work Consult for [place] Planning/Counseling [place] Not Applicable  Medication Review Press photographer) Referral to Pharmacy  Some recent data might be hidden

## 2021-02-11 NOTE — Progress Notes (Signed)
[facility] PROGRESS NOTE  Carolyn Norton MKL:[id number] DOB: Feb 28, 1966 DOA: 01/11/2021 PCP: [name], FNP  Status: Remains inpatient appropriate because:Unsafe d/c plan  Dispo:  Patient From:  Home  Planned Disposition: To be determinedHome-family has agreed to take patient back when she is medically stable and able to mobilize independently with minimal assistance.  Medically stable for discharge:  NO  Barriers to DC: Continues to require significant assistance with mobility.             Difficult to place: Yes  Level of care: Med-Surg  Code Status: DNR Family Communication: 5/30 father-regarding possible NPH and dc planning diagnosis-6/3 updated by TOC re dc plans DVT prophylaxis: Lovenox COVID vaccination status: Unknown   HPI: 55 year old female past medical history of diabetes mellitus type 2, bipolar disorder type I, previous COVID infection, polysubstance abuse that includes cocaine.  She presented from home where she lives with her parents.  Her presenting symptoms included generalized weakness, multiple falls and confusion.  Family confirmed that she had been having the symptoms for at least a month with last use of crack cocaine 1 month prior to presentation.  Ever since that time she had not been doing well.  She would also be having issues with hyperglycemia.  In the ER she was diagnosed with severe dehydration, hyperglycemia and metabolic encephalopathy.  Since admission patient has been evaluated by the psychiatric team who has deemed her alert and oriented and having capacity to make decisions.  Patient is agreeable to placement at a rehab.  Currently she has severe physical deconditioning which was POA and PT is recommending SNF.  Because of her history of polysubstance abuse and lack of funding SNF is not an option.  Family is agreeable to taking her back in home when she is able to ambulate well enough to get back and forth for meals into the  bathroom.  Subjective: Alert and pleasant sitting up in bed.  Stated just finished about half of her breakfast.  No specific complaints otherwise  Objective: Vitals:   02/10/21 2128 02/11/21 0416  BP: 126/74 123/74  Pulse: 73 80  Resp: 20 16  Temp: 97.7 F (36.5 C) 97.8 F (36.6 C)  SpO2: 91% 93%   No intake or output data in the 24 hours ending 02/11/21 1117 Filed Weights   01/11/21 1152  Weight: 99.8 kg    Exam:  Constitutional: Alert, calm and appears comfortable Respiratory: Posterior lung sounds remain clear, room air.  Sats averaging between 98 and 99% Cardiac: S1-S2, no JVD, no peripheral edema.  Regular pulse Abdomen: LBM 6/6, soft nontender nondistended with normoactive bowel sounds.  Stated ate about 50% of breakfast tray today and documentation in chart reveals patient eating between 30 to 40% of meals typically Neurologic: CN 2-12 grossly intact. Sensation intact, Strength 4/5 x all 4 extremities.   Psychiatric: Alert and oriented to name only.  When asked why she had to come to the hospital she stated "because I had memory issues".    Assessment/Plan: Acute problems: Acute metabolic encephalopathy (multifactorial): Severe dehydration in context of poorly controlled diabetes mellitus with hyperglycemia Initially felt to be secondary to cocaine withdrawal and poor nutrition POA contributed-of note family later confirmed patient with significant change in ability to manage care and ambulate after COVID infection. Acute encephalopathy resolved but persistent cognitive and gait issues.  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 On 6/3 after large-volume LP patient did  gain some improvement in mobility and mentation. Neurosurgery recommended an additional large-volume LP -Per neurology documentation neurosurgery team was unable to find the full large-volume LP study that included PT and MMSE notes.  Neurology concerned that given patient's difficult anatomy that it may not be prudent to pursue another large-volume LP and instead pursue placement of VP shunt.  They will contact neurosurgery and discussed their concerns  Anorexia/constipation Iimprovemed in intake and appetite seen after large-volume LP  Diabetes mellitus 2 with hyperglycemia on insulin Hemoglobin A1c 11.0 Improved glycemic control -continue metformin to 1000 mg BID Continue 70/30 insulin 8 units with breakfast and 6 units with supper  Meal coverage discontinued since intake remains poor/variable Continue SSI  Dyslipidemia Continue statin  History of bipolar 1 disorder/polysubstance abuse/cognitive impairment Continue escitalopram, Neurontin and Depakote Cleared by psych regarding orientation and capacity Functional cognitive evaluation per SLP and Occupational Therapy with significant abnormalities Will need to have repeat cognitive evaluations after VP shunt placed to ensure no residual abnormalities   Other problems: Acute kidney injury on CKD 3a Acute kidney injury resolved after IV fluids Baseline creatinine 1.3  COPD Continue Singulair, nebs as needed, consider adding LABA  GERD Continue PPI  Hypokalemia/hypomagnesemia Resolved  Data Reviewed: Basic Metabolic Panel: No results for input(s): NA, K, CL, CO2, GLUCOSE, BUN, CREATININE, CALCIUM, MG, PHOS in the last 168 hours. Liver Function Tests: No results for input(s): AST, ALT, ALKPHOS, BILITOT, PROT, ALBUMIN in the last 168 hours. No results for input(s): LIPASE, AMYLASE in the last 168 hours. No results for input(s): AMMONIA in the last 168 hours. CBC: No results for input(s): WBC, NEUTROABS, HGB, HCT, MCV, PLT in  the last 168 hours. Cardiac Enzymes: No results for input(s): CKTOTAL, CKMB, CKMBINDEX, TROPONINI in the last 168 hours. BNP (last 3 results) Recent Labs    01/14/21 0238 01/15/21 0230 01/16/21 0201  BNP 177.8* 115.9* 40.9    ProBNP (last 3 results) No results for input(s): PROBNP in the last 8760 hours.  CBG: Recent Labs  Lab 02/10/21 1213 02/10/21 1628 02/10/21 2131 02/11/21 0803 02/11/21 1109  GLUCAP 245* 92 71 353* 280*    Recent Results (from the past 240 hour(s))  CSF culture w Gram Stain     Status: None   Collection Time: 02/05/21  2:25 PM   Specimen: PATH Cytology CSF; Cerebrospinal Fluid  Result Value Ref Range Status   Specimen Description CSF  Final   Special Requests NONE  Final   Gram Stain NO WBC SEEN NO ORGANISMS SEEN   Final   Culture   Final    NO GROWTH 3 DAYS Performed at [facility], [address], [place], [place] [address]    Report Status 02/09/2021 FINAL  Final  CSF culture w Gram Stain     Status: None   Collection Time: 02/06/21 11:08 AM   Specimen: PATH Cytology CSF; Cerebrospinal Fluid  Result Value Ref Range Status   Specimen Description CSF  Final   Special Requests NONE  Final   Gram Stain   Final    WBC PRESENT,BOTH PMN AND MONONUCLEAR NO ORGANISMS SEEN CYTOSPIN SMEAR    Culture   Final    NO GROWTH 3 DAYS Performed at [facility], [address] [place], [place] [address]    Report Status  02/09/2021 FINAL  Final     Studies: No results found.  Scheduled Meds: . atorvastatin  20 mg Oral Daily  . cholecalciferol  1,000 Units Oral Daily  . divalproex  500 mg Oral BID  . enoxaparin (LOVENOX) injection  40 mg Subcutaneous Daily  . escitalopram  20 mg Oral Daily  . feeding supplement (GLUCERNA SHAKE)  237 mL Oral TID AC & HS  . gabapentin  300 mg Oral BID  . insulin aspart  0-5 Units Subcutaneous QHS  . insulin aspart  0-9 Units Subcutaneous TID WC  . insulin aspart protamine- aspart  6 Units  Subcutaneous Q supper  . insulin aspart protamine- aspart  8 Units Subcutaneous Q breakfast  . metFORMIN  1,000 mg Oral BID WC  . montelukast  10 mg Oral QHS  . pantoprazole  40 mg Oral Daily  . senna-docusate  2 tablet Oral Daily  . thiamine  100 mg Oral Daily   Continuous Infusions: . sodium chloride 10 mL/hr at 02/10/21 1230    Principal Problem:   Bipolar 1 disorder, depressed, full remission (HCC) Active Problems:   Bipolar 1 disorder (HCC)   Generalized weakness   Consultants:  Psychiatry  Procedures:  None  Antibiotics: Anti-infectives (From admission, onward)   None       Time spent: 35 minutes    [name] ANP  [facility] 7 am - 330 pm/M-F for direct patient care and secure chat Please refer to Amion for contact info 30  days

## 2021-02-11 NOTE — Progress Notes (Addendum)
Physical Therapy Treatment Patient Details Name: Carolyn Norton MRN: [id number] DOB: 08/25/1966 Today's Date: 02/11/2021    History of Present Illness Pt presents from home on 01/13/21 with multiple falls, confusion, and weakness. In ED blood glucose was 729 and pt was unable to ambulate.  Patient admitted with severe dehydration and metabolic encephalopathy. PMH: DM2, COPD, CKD3, GERD, bipolar disorder type 1, previous covid 19 infection, polysubstance abuse including cocaine.    PT Comments    Pt admitted with above diagnosis. Pt was unable to advance feet for ambulation needing mod to max assist just to stand with heavy posterior lean. Performed exercises in chair. Met 0/5 goals. Goals revised.  Pt currently with functional limitations due to balance and endurance deficits. Pt will benefit from skilled PT to increase their independence and safety with mobility to allow discharge to the venue listed below.     Follow Up Recommendations  SNF     Equipment Recommendations  Rolling walker with 5" wheels;3in1 (PT)    Recommendations for Other Services OT consult     Precautions / Restrictions Precautions Precautions: Fall Restrictions Weight Bearing Restrictions: No    Mobility  Bed Mobility Overal bed mobility: Needs Assistance Bed Mobility: Supine to Sit Rolling: Min assist Sidelying to sit: Mod assist       General bed mobility comments: Pt in chair on arrival    Transfers Overall transfer level: Needs assistance Equipment used: Rolling walker (2 wheeled) Transfers: Sit to/from Stand Sit to Stand: Max assist;Mod assist Stand pivot transfers: Total assist       General transfer comment: Pt required mod to max A for sit to stand to RW with 1-step verbal commands for hand placement as well as assist for upright posture with pt leaning heavily posteriorly at times. Pt leaning so hard could not walk.  Tried to get pt to perform marching and pt refused and sat  abruptly.  After 3 stand attempts with mod to max assist support, decided to just do exercise with pt.  Ambulation/Gait                 Stairs             Wheelchair Mobility    Modified Rankin (Stroke Patients Only)       Balance Overall balance assessment: Needs assistance;History of Falls Sitting-balance support: Feet supported;Bilateral upper extremity supported Sitting balance-Leahy Scale: Poor Sitting balance - Comments: Static sitting at EOB with Mod A at most and Min A at best. Able to maintain static sitting balance with close supervision A with bilateral hands on rail of Stedy. Postural control: Posterior lean Standing balance support: During functional activity;Bilateral upper extremity supported Standing balance-Leahy Scale: Poor Standing balance comment: Reliant on BUE on RW and external assist.                            Cognition Arousal/Alertness: Awake/alert Behavior During Therapy: WFL for tasks assessed/performed Overall Cognitive Status: Impaired/Different from baseline Area of Impairment: Orientation;Attention;Memory;Following commands;Safety/judgement;Awareness;Problem solving                 Orientation Level: Disoriented to;Place;Time;Situation (Able to state city and state.) Current Attention Level: Sustained Memory: Decreased short-term memory Following Commands: Follows one step commands inconsistently;Follows one step commands with increased time Safety/Judgement: Decreased awareness of safety;Decreased awareness of deficits Awareness: Intellectual Problem Solving: Slow processing;Difficulty sequencing;Requires verbal cues;Decreased initiation;Requires tactile cues General Comments: Continued decreased attention to tasks  requiring constant cuing to perform.      Exercises General Exercises - Lower Extremity Ankle Circles/Pumps: AROM;Both;10 reps;Seated Long Arc Quad: AROM;Both;10 reps;Seated Hip Flexion/Marching:  AROM;Both;10 reps;Seated Other Exercises Other Exercises: Sitting balance with reaching outside of BOS in prep for functional transfers.    General Comments General comments (skin integrity, edema, etc.): Patient functioning better this date compared to last OT visit.      Pertinent Vitals/Pain Pain Assessment: No/denies pain    Home Living                      Prior Function            PT Goals (current goals can now be found in the care plan section) Acute Rehab PT Goals Patient Stated Goal: To go home. PT Goal Formulation: Patient unable to participate in goal setting Time For Goal Achievement: 02/25/21 Potential to Achieve Goals: Fair Progress towards PT goals: Not progressing toward goals - comment (limited today due to pt requiring mod to max assist)    Frequency    Min 2X/week      PT Plan Current plan remains appropriate    Co-evaluation              AM-PAC PT "6 Clicks" Mobility   Outcome Measure  Help needed turning from your back to your side while in a flat bed without using bedrails?: A Lot Help needed moving from lying on your back to sitting on the side of a flat bed without using bedrails?: A Lot Help needed moving to and from a bed to a chair (including a wheelchair)?: A Lot Help needed standing up from a chair using your arms (e.g., wheelchair or bedside chair)?: A Lot Help needed to walk in hospital room?: A Lot Help needed climbing 3-5 steps with a railing? : Total 6 Click Score: 11    End of Session Equipment Utilized During Treatment: Gait belt Activity Tolerance: Patient limited by fatigue Patient left: with call bell/phone within reach;in chair;with chair alarm set Nurse Communication: Mobility status;Need for lift equipment (Stedy likely more efficient.) PT Visit Diagnosis: Muscle weakness (generalized) (M62.81);Repeated falls (R29.6);Difficulty in walking, not elsewhere classified (R26.2);Unsteadiness on feet (R26.81)      Time: 6301-6010 PT Time Calculation (min) (ACUTE ONLY): 13 min  Charges:  $Therapeutic Exercise: 8-22 mins                     Sheilyn Boehlke M,PT Acute Rehab Services [phone] (pager)   Alvira Philips 02/11/2021, 11:56 AM

## 2021-02-11 NOTE — Plan of Care (Signed)
Patient remains free from falls. Safety precautions maintained. 

## 2021-02-11 NOTE — Progress Notes (Signed)
Palliative Medicine Inpatient Follow Up Note  Reason for consult:  Goals of Care "Patient with bipolar and substance abuse.  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  She is not eating. Current plan if unable to get placed would be to go home with elderly parents."  HPI:  Per intake H&P --> 55 year old female past medical history of diabetes mellitus type 2, bipolar disorder type I, previous COVID infection, polysubstance abuse that includes cocaine.  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  She is not eating or drinking sufficiently. Palliative care has been asked to get involved to further discuss goals of care in the setting of progressive failure to thrive.  Today's Discussion (02/11/2021):  *Please note that this is a verbal dictation therefore any spelling or grammatical errors are due to the "[place]" system interpretation.   Chart reviewed.  I met with Carolyn Norton at bedside this morning, she remained alert and oriented.  I discussed goals of care with her given her improved neurological state.  We were able to review and complete a MOST form together with wishes as below:  Cardiopulmonary Resuscitation: Do Not Attempt Resuscitation (DNR/No CPR)  Medical Interventions: Limited Additional Interventions: Use medical treatment, IV fluids and cardiac monitoring as indicated, DO NOT USE intubation or mechanical ventilation. May consider use of less invasive airway support such as BiPAP or CPAP. Also provide comfort measures. Transfer to the hospital if indicated. Avoid intensive care.   Antibiotics: Determine use of limitation of antibiotics when infection occurs  IV Fluids: IV fluids for a defined trial period  Feeding Tube: No feeding tube   Review of neurology notes it appears that patient would likely benefit from a lumbar drain or shunt.  This will be discussed by the  neurologist with the neurosurgery team.  All Palliative goals for [facility] at this point are clear  Objective Assessment: Vital Signs Vitals:   02/10/21 2128 02/11/21 0416  BP: 126/74 123/74  Pulse: 73 80  Resp: 20 16  Temp: 97.7 F (36.5 C) 97.8 F (36.6 C)  SpO2: 91% 93%   No intake or output data in the 24 hours ending 02/11/21 0817 Last Weight  Most recent update: 01/11/2021 11:52 AM   Weight  99.8 kg (220 lb)           Gen:  Caucasian F in NAD HEENT: moist mucous membranes CV: Regular rate and rhythm  PULM: clear to auscultation bilaterally  ABD: soft/nontender  EXT: No edema  Neuro: Awake, alert  SUMMARY OF RECOMMENDATIONS DNAR/DNI, No G-Tube  Gold DNR on the chart/ MOST on chart   Goals: Improve present condition. Per patients mother long term placement - she realizes that [place] may continue to decline and if this occurs additional [place] conversations will be needed  Palliative goals at this juncture are clear per conversations with Erin Hearing, NP we will sign off. If additional PMT are needed please call [phone] at any time  Time Spent: 35 Greater than 50% of the time was spent in counseling and coordination of care ______________________________________________________________________________________ [place] Team Team Cell Phone: [phone] Please utilize secure chat with additional questions, if there is no response within 30 minutes please call the above phone number  Palliative Medicine Team providers are available by phone from 7am to 7pm daily and can be reached through the team cell phone.  Should this patient require assistance outside of these hours, please call the patient's attending  physician.

## 2021-02-11 NOTE — Progress Notes (Signed)
Occupational Therapy Treatment Patient Details Name: Carolyn Norton MRN: [id number] DOB: 09-02-1966 Today's Date: 02/11/2021    History of present illness Pt presents from home on 01/13/21 with multiple falls, confusion, and weakness. In ED blood glucose was 729 and pt was unable to ambulate.  Patient admitted with severe dehydration and metabolic encephalopathy. PMH: DM2, COPD, CKD3, GERD, bipolar disorder type 1, previous covid 19 infection, polysubstance abuse including cocaine.   OT comments  Patient with slow progress toward goals. Able to correctly identify 3/3 ADL items and demo correct usage. Patient with better attention to task although noted continued severe deficits in all aspects of cognition. Patient progressed from bed to recliner via Stedy in prep for meal. OT will continue to follow acutely.    Follow Up Recommendations  SNF;Supervision/Assistance - 24 hour    Equipment Recommendations  3 in 1 bedside commode    Recommendations for Other Services      Precautions / Restrictions Precautions Precautions: Fall Restrictions Weight Bearing Restrictions: No       Mobility Bed Mobility Overal bed mobility: Needs Assistance Bed Mobility: Supine to Sit Rolling: Min assist Sidelying to sit: Mod assist       General bed mobility comments: Min A for rolling to L and Mod A at BLE and trunk to come to sitting position at EOB. HOB >30 degrees.    Transfers Overall transfer level: Needs assistance   Transfers: Sit to/from Stand;Stand Pivot Transfers Sit to Stand: Min assist;Max assist Stand pivot transfers: Total assist       General transfer comment: Min A for sit to stand in [place] with 1-step verbal commands for hand placement. Total A for transfer to recliner with use of Stedy.    Balance Overall balance assessment: Needs assistance;History of Falls Sitting-balance support: Feet supported;Bilateral upper extremity supported Sitting balance-Leahy Scale:  Poor Sitting balance - Comments: Static sitting at EOB with Mod A at most and Min A at best. Able to maintain static sitting balance with close supervision A with bilateral hands on rail of Stedy. Postural control: Posterior lean Standing balance support: During functional activity;Bilateral upper extremity supported Standing balance-Leahy Scale: Poor                             ADL either performed or assessed with clinical judgement   ADL Overall ADL's : Needs assistance/impaired                         Toilet Transfer: Total assistance Toilet Transfer Details (indicate cue type and reason): Simulated with transfer to recliner with use of Stedy. Patient able to stand with Min A in [place] with 1-step verbal commands for anterior shift and hand placement.           General ADL Comments: Progressing toward goals this date.     Vision       Perception     Praxis      Cognition Arousal/Alertness: Awake/alert Behavior During Therapy: WFL for tasks assessed/performed Overall Cognitive Status: Impaired/Different from baseline Area of Impairment: Orientation;Attention;Memory;Following commands;Safety/judgement;Awareness;Problem solving                 Orientation Level: Disoriented to;Place;Time;Situation (Able to state city and state.) Current Attention Level: Sustained Memory: Decreased short-term memory Following Commands: Follows one step commands inconsistently;Follows one step commands with increased time Safety/Judgement: Decreased awareness of safety;Decreased awareness of deficits Awareness: Intellectual Problem Solving:  Slow processing;Difficulty sequencing;Requires verbal cues;Decreased initiation;Requires tactile cues General Comments: Patient with better attention to taks this date. Able to name 2 animals upon request to name 5. Named 3 additional animals with cueing. Patient able to correctly identify 3/3 ADL items and demo correct usage.  Continued decreased attention to task requiring cues for initiate self-feeding.        Exercises Other Exercises Other Exercises: Sitting balance with reaching outside of BOS in prep for functional transfers.   Shoulder Instructions       General Comments Patient functioning better this date compared to last OT visit.    Pertinent Vitals/ Pain       Pain Assessment: No/denies pain  Home Living                                          Prior Functioning/Environment              Frequency  Min 2X/week        Progress Toward Goals  OT Goals(current goals can now be found in the care plan section)  Progress towards OT goals: Progressing toward goals  Acute Rehab OT Goals Patient Stated Goal: To go home. OT Goal Formulation: Patient unable to participate in goal setting Time For Goal Achievement: 02/21/21 Potential to Achieve Goals: Fair ADL Goals Pt Will Perform Grooming: with supervision;standing Pt Will Perform Upper Body Dressing: sitting;with set-up Pt Will Perform Lower Body Dressing: with supervision;sit to/from stand Pt Will Transfer to Toilet: with supervision;ambulating Pt Will Perform Toileting - Clothing Manipulation and hygiene: with supervision;sit to/from stand Pt/caregiver will Perform Home Exercise Program: Increased strength;Both right and left upper extremity;With theraband;With written HEP provided Additional ADL Goal #1: Patient will recall 3 fall risk reducing strategies in prep for safe d/c to next level of care.  Plan Frequency remains appropriate;Discharge plan remains appropriate    Co-evaluation                 AM-PAC OT "6 Clicks" Daily Activity     Outcome Measure   Help from another person eating meals?: None Help from another person taking care of personal grooming?: A Lot Help from another person toileting, which includes using toliet, bedpan, or urinal?: Total Help from another person bathing (including  washing, rinsing, drying)?: Total Help from another person to put on and taking off regular upper body clothing?: A Lot Help from another person to put on and taking off regular lower body clothing?: Total 6 Click Score: 11    End of Session Equipment Utilized During Treatment: Gait belt;Rolling walker  OT Visit Diagnosis: Unsteadiness on feet (R26.81);Other abnormalities of gait and mobility (R26.89);Muscle weakness (generalized) (M62.81);Other symptoms and signs involving cognitive function;Other symptoms and signs involving the nervous system (R29.898)   Activity Tolerance Other (comment) (Session again limited by decreased command following and increased need for external assist.)   Patient Left in chair;with call bell/phone within reach;with chair alarm set   Nurse Communication Mobility status (Stedy +2)        Time: 3500-9381 OT Time Calculation (min): 19 min  Charges: OT General Charges $OT Visit: 1 Visit OT Treatments $Self Care/Home Management : 8-22 mins  Kenson Groh H. OTR/L Supplemental OT, Department of rehab services [phone]   Maxen Rowland R H. 02/11/2021, 8:52 AM

## 2021-02-11 NOTE — Progress Notes (Signed)
   Providing Compassionate, Quality Care - Together  NEUROSURGERY PROGRESS NOTE   S: No issues overnight.   O: EXAM:  BP 123/74 (BP Location: Left Arm)   Pulse 80   Temp 97.8 F (36.6 C)   Resp 16   Ht 5\' 6"  (1.676 m)   Wt 99.8 kg   LMP 01/26/2017 (Approximate)   SpO2 93%   BMI 35.51 kg/m   Awake, alert, oriented to person and place, not time Speech slow, easily distracted and pleasant. Tangential thoughts at times Face symmetric PERRL CNs grossly intact  5/5 BUE/BLE   ASSESSMENT:  55 y.o. female with   1.  Ventriculomegaly -Possible NPH versus ex vacuo  PLAN: -Patient has reported outcome of improvement per PT evaluation/neurology evaluation after high-volume tap. -Per the patient, she feels as though she did not get any significant improvement -The MRI and CT were reviewed, there is some transependymal flow and rounding of the third ventricle concerning for ventriculomegaly possibly from ex vacuo etiology or NPH.  The patient unfortunately is a poor historian. -Options include lumbar drain trial, repeat lumbar puncture, VP shunt placement -I will discuss the options with the patient's family -Aspirin has been discontinued for possible lumbar drain versus shunt placement -Possible candidate for lumbar drain, however given difficulty with LP, this may be challenging as well.  CSF diversion for NPH at this point is not urgent.  Input from the family will be helpful for delineation of her symptomatology/timeline and improvement from a high-volume tap.   Thank you for allowing me to participate in this patient's care.  Please do not hesitate to call with questions or concerns.   41, DO Neurosurgeon [facility] Cell: [phone]

## 2021-02-12 LAB — GLUCOSE, CAPILLARY
Glucose-Capillary: 537 mg/dL (ref 70–99)
Glucose-Capillary: 426 mg/dL — ABNORMAL HIGH (ref 70–99)
Glucose-Capillary: 320 mg/dL — ABNORMAL HIGH (ref 70–99)
Glucose-Capillary: 87 mg/dL (ref 70–99)
Glucose-Capillary: 147 mg/dL — ABNORMAL HIGH (ref 70–99)

## 2021-02-12 LAB — BASIC METABOLIC PANEL
Anion gap: 8 (ref 5–15)
BUN: 16 mg/dL (ref 6–20)
CO2: 29 mmol/L (ref 22–32)
Calcium: 8.8 mg/dL — ABNORMAL LOW (ref 8.9–10.3)
Chloride: 96 mmol/L — ABNORMAL LOW (ref 98–111)
Creatinine, Ser: 1.02 mg/dL — ABNORMAL HIGH (ref 0.44–1.00)
GFR, Estimated: >60 mL/min (ref >60)
Glucose, Bld: 607 mg/dL (ref 70–99)
Potassium: 5.2 mmol/L — ABNORMAL HIGH (ref 3.5–5.1)
Sodium: 133 mmol/L — ABNORMAL LOW (ref 135–145)

## 2021-02-12 MED ORDER — INSULIN ASPART 100 UNIT/ML IJ SOLN
12.0000 [IU] | Freq: Once | INTRAMUSCULAR | Status: AC
  Administered 2021-02-12: 12 [IU] via SUBCUTANEOUS

## 2021-02-12 MED ORDER — SODIUM CHLORIDE 0.9 % IV SOLN: INTRAVENOUS | Status: AC

## 2021-02-12 MED ORDER — INSULIN ASPART 100 UNIT/ML IJ SOLN
0.0000 [IU] | INTRAMUSCULAR | Status: DC
  Administered 2021-02-12: 7 [IU] via SUBCUTANEOUS
  Administered 2021-02-12: 1 [IU] via SUBCUTANEOUS
  Administered 2021-02-13: 5 [IU] via SUBCUTANEOUS
  Administered 2021-02-13: 9 [IU] via SUBCUTANEOUS
  Administered 2021-02-14: 2 [IU] via SUBCUTANEOUS
  Administered 2021-02-14: 7 [IU] via SUBCUTANEOUS
  Administered 2021-02-14: 3 [IU] via SUBCUTANEOUS
  Administered 2021-02-15 (×2): 2 [IU] via SUBCUTANEOUS
  Administered 2021-02-15: 5 [IU] via SUBCUTANEOUS
  Administered 2021-02-15: 3 [IU] via SUBCUTANEOUS
  Administered 2021-02-16 (×3): 1 [IU] via SUBCUTANEOUS
  Administered 2021-02-16: 2 [IU] via SUBCUTANEOUS
  Administered 2021-02-16: 3 [IU] via SUBCUTANEOUS
  Administered 2021-02-17: 2 [IU] via SUBCUTANEOUS
  Administered 2021-02-17: 3 [IU] via SUBCUTANEOUS
  Administered 2021-02-17: 2 [IU] via SUBCUTANEOUS
  Administered 2021-02-17 (×2): 3 [IU] via SUBCUTANEOUS
  Administered 2021-02-17: 2 [IU] via SUBCUTANEOUS
  Administered 2021-02-18: 1 [IU] via SUBCUTANEOUS
  Administered 2021-02-18: 2 [IU] via SUBCUTANEOUS
  Administered 2021-02-18 (×2): 3 [IU] via SUBCUTANEOUS
  Administered 2021-02-19: 5 [IU] via SUBCUTANEOUS
  Administered 2021-02-19: 3 [IU] via SUBCUTANEOUS
  Administered 2021-02-19 – 2021-02-20 (×3): 2 [IU] via SUBCUTANEOUS
  Administered 2021-02-20 (×3): 3 [IU] via SUBCUTANEOUS
  Administered 2021-02-20 – 2021-02-21 (×3): 2 [IU] via SUBCUTANEOUS
  Administered 2021-02-21: 1 [IU] via SUBCUTANEOUS
  Administered 2021-02-21 – 2021-02-22 (×3): 2 [IU] via SUBCUTANEOUS
  Administered 2021-02-22: 3 [IU] via SUBCUTANEOUS
  Administered 2021-02-23: 2 [IU] via SUBCUTANEOUS
  Administered 2021-02-23 (×2): 1 [IU] via SUBCUTANEOUS
  Administered 2021-02-23: 2 [IU] via SUBCUTANEOUS
  Administered 2021-02-23: 1 [IU] via SUBCUTANEOUS
  Administered 2021-02-24 (×3): 3 [IU] via SUBCUTANEOUS
  Administered 2021-02-24: 2 [IU] via SUBCUTANEOUS
  Administered 2021-02-25: 7 [IU] via SUBCUTANEOUS
  Administered 2021-02-25 (×2): 2 [IU] via SUBCUTANEOUS
  Administered 2021-02-25 (×2): 3 [IU] via SUBCUTANEOUS
  Administered 2021-02-25: 2 [IU] via SUBCUTANEOUS
  Administered 2021-02-26: 5 [IU] via SUBCUTANEOUS
  Administered 2021-02-26: 2 [IU] via SUBCUTANEOUS

## 2021-02-12 NOTE — Progress Notes (Addendum)
Inpatient Diabetes Program Recommendations  AACE/ADA: New Consensus Statement on Inpatient Glycemic Control (2015)  Target Ranges:  Prepandial:   less than 140 mg/dL      Peak postprandial:   less than 180 mg/dL (1-2 hours)      Critically ill patients:  140 - 180 mg/dL   Lab Results  Component Value Date   GLUCAP 426 (H) 02/12/2021   HGBA1C 11.0 (H) 01/12/2021    Review of Glycemic Control Results for HEENA, [place] (MRN [id number]) as of 02/12/2021 10:33  Ref. Range 02/10/2021 08:14 02/10/2021 12:13 02/10/2021 16:28 02/10/2021 21:31 02/11/2021 08:03 02/11/2021 11:09 02/11/2021 16:01 02/11/2021 20:38 02/12/2021 07:39 02/12/2021 07:41 02/12/2021 10:08  Glucose-Capillary Latest Ref Range: 70 - 99 mg/dL 481 (H) 856 (H) 92 71 314 (H) 280 (H) 105 (H) 160 (H) >600 (HH) 537 (HH) 426 (H)   Diabetes history:  DM2 Outpatient Diabetes medications:  Lantus 45 units QHS Current orders for Inpatient glycemic control:  70/30 8 units QAM, 6 units QPM Novolog 0-9 units TID and 0-5 units QHS Metformin 1000 mg BID  Inpatient Diabetes Program Recommendations:     Attempted to speak with patient regarding eating/drinking supplements throughout the night but she was not able to answer appropriately.  Might consider, Novolog 0-9 units Q4H.  Spoke with [name], NP and she is going to increase her PM 70/30 dose.   Will continue to follow while inpatient.  Thank you, Dulce Sellar, RN, BSN Diabetes Coordinator Inpatient Diabetes Program [phone] (team pager from 8a-5p)

## 2021-02-12 NOTE — Progress Notes (Addendum)
[facility] PROGRESS NOTE  Lakisa Lotz HTX:[id number] DOB: 10-13-1965 DOA: 01/11/2021 PCP: [name], FNP  Status: Remains inpatient appropriate because:Unsafe d/c plan  Dispo:  Patient From:  Home  Planned Disposition: To be determinedHome-family has agreed to take patient back when she is medically stable and able to mobilize independently with minimal assistance.  Medically stable for discharge:  NO  Barriers to DC: Continues to require significant assistance with mobility.             Difficult to place: Yes  Level of care: Med-Surg  Code Status: DNR Family Communication: 5/30 father-regarding possible NPH and dc planning diagnosis-6/3 updated by TOC re dc plans DVT prophylaxis: Lovenox COVID vaccination status: Unknown   HPI: 55 year old female past medical history of diabetes mellitus type 2, bipolar disorder type I, previous COVID infection, polysubstance abuse that includes cocaine.  She presented from home where she lives with her parents.  Her presenting symptoms included generalized weakness, multiple falls and confusion.  Family confirmed that she had been having the symptoms for at least a month with last use of crack cocaine 1 month prior to presentation.  Ever since that time she had not been doing well.  She would also be having issues with hyperglycemia.  In the ER she was diagnosed with severe dehydration, hyperglycemia and metabolic encephalopathy.  Since admission patient has been evaluated by the psychiatric team who has deemed her alert and oriented and having capacity to make decisions.  Patient is agreeable to placement at a rehab.  Currently she has severe physical deconditioning which was POA and PT is recommending SNF.  Because of her history of polysubstance abuse and lack of funding SNF is not an option.  Family is agreeable to taking her back in home when she is able to ambulate well enough to get back and forth for meals into the  bathroom.  Subjective: Slightly more lethargic today.  Inconsistent answering of orientation questions but was oriented to name and place today.  Of note patient CBG near 600 today and this could be influencing her mentation as well as her appetite.  Objective: Vitals:   02/11/21 2039 02/12/21 0359  BP: 107/70 (!) 152/73  Pulse: 77 87  Resp: 20 20  Temp: 98.2 F (36.8 C) 98.1 F (36.7 C)  SpO2: 100%    No intake or output data in the 24 hours ending 02/12/21 0753 Filed Weights   01/11/21 1152  Weight: 99.8 kg    Exam:  Constitutional: Sleepy but awakens easily.  Pleasant and appears to be in no acute distress Respiratory: Lungs are clear, she is stable on room air with O2 saturation 96 to 100% Cardiac: Normal heart sounds, no peripheral edema, regular pulse Abdomen: LBM 6/6, soft nontender nondistended normoactive bowel sounds.  Variable oral intake Neurologic: CN 2-12 grossly intact. Sensation intact, Strength 4/5 x all 4 extremities.   Psychiatric: Alert and oriented to name, place, president but not to year    Assessment/Plan: Acute problems: Acute metabolic encephalopathy (multifactorial): Severe dehydration in context of poorly controlled diabetes mellitus with hyperglycemia Initially felt to be secondary to cocaine withdrawal and poor nutrition POA contributed-of note family later confirmed patient with significant change in ability to manage care and ambulate after COVID infection. Acute encephalopathy resolved but persistent cognitive and gait issues.  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 On 6/3 after large-volume LP patient did gain some improvement in mobility and  mentation. Neurosurgery considering VP shunt vs additional one-time drainage with either LP or lumbar drain trial  Anorexia/constipation Iimprovemed in intake and appetite seen after large-volume LP  Diabetes mellitus 2 with hyperglycemia on insulin Hemoglobin A1c 11.0 Continue metformin to 1000 mg BID Continue 70/30 insulin 8 units with breakfast and increase to 8 units with supper  CBGs over the past 24 hours have been variable between an afternoon low of 105, early a.m. hyperglycemia yesterday of 353 and again significant hyperglycemia this morning of 537.  We will give 12 units of NovoLog sliding scale coverage -see above regarding changes to 70/30 insulin -Anion gap normal -CBG down to 400 after additional insulin.  We will give normal saline 150 cc an hour x6 hours then back to keep open Meal coverage discontinued since intake remains poor/variable Continue SSI  Dyslipidemia Continue statin  History of bipolar 1 disorder/polysubstance abuse/cognitive impairment Continue escitalopram, Neurontin and Depakote Cleared by psych regarding orientation and capacity Functional cognitive evaluation per SLP and Occupational Therapy with significant abnormalities Will need to have repeat cognitive evaluations after VP shunt placed to ensure no residual abnormalities   Other problems: Acute kidney injury on CKD 3a Acute kidney injury resolved after IV fluids Baseline creatinine 1.3  COPD Continue Singulair, nebs as needed, consider adding LABA  GERD Continue PPI  Hypokalemia/hypomagnesemia Resolved  Data Reviewed: Basic Metabolic Panel: No results for input(s): NA, K, CL, CO2, GLUCOSE, BUN, CREATININE, CALCIUM, MG, PHOS in the last 168 hours. Liver Function Tests: No results for input(s): AST, ALT, ALKPHOS, BILITOT, PROT, ALBUMIN in the last 168 hours. No results for input(s): LIPASE, AMYLASE in the last 168 hours. No results for input(s): AMMONIA in the last 168  hours. CBC: No results for input(s): WBC, NEUTROABS, HGB, HCT, MCV, PLT in the last 168 hours. Cardiac Enzymes: No results for input(s): CKTOTAL, CKMB, CKMBINDEX, TROPONINI in the last 168 hours. BNP (last 3 results) Recent Labs    01/14/21 0238 01/15/21 0230 01/16/21 0201  BNP 177.8* 115.9* 40.9    ProBNP (last 3 results) No results for input(s): PROBNP in the last 8760 hours.  CBG: Recent Labs  Lab 02/11/21 1109 02/11/21 1601 02/11/21 2038 02/12/21 0739 02/12/21 0741  GLUCAP 280* 105* 160* >600* 537*    Recent Results (from the past 240 hour(s))  CSF culture w Gram Stain     Status: None   Collection Time: 02/05/21  2:25 PM   Specimen: PATH Cytology CSF; Cerebrospinal Fluid  Result Value Ref Range Status   Specimen Description CSF  Final   Special Requests NONE  Final   Gram Stain NO WBC SEEN NO ORGANISMS SEEN   Final   Culture   Final    NO GROWTH 3 DAYS Performed at [facility], [address] [place], [place] [address]    Report Status 02/09/2021 FINAL  Final  CSF culture w Gram Stain     Status: None   Collection Time: 02/06/21 11:08 AM   Specimen: PATH Cytology CSF; Cerebrospinal Fluid  Result Value Ref Range Status   Specimen Description CSF  Final   Special Requests NONE  Final   Gram Stain   Final    WBC PRESENT,BOTH PMN AND MONONUCLEAR NO ORGANISMS SEEN CYTOSPIN SMEAR    Culture   Final    NO  GROWTH 3 DAYS Performed at [facility], [address], [place], [place] [address]    Report Status 02/09/2021 FINAL  Final     Studies: No results found.  Scheduled Meds: . atorvastatin  20 mg Oral Daily  . cholecalciferol  1,000 Units Oral Daily  . divalproex  500 mg Oral BID  . enoxaparin (LOVENOX) injection  40 mg Subcutaneous Daily  . escitalopram  20 mg Oral Daily  . feeding supplement (GLUCERNA SHAKE)  237 mL Oral TID AC & HS  . gabapentin  300 mg Oral BID  . insulin aspart  0-5 Units Subcutaneous QHS  . insulin aspart  0-9  Units Subcutaneous TID WC  . insulin aspart protamine- aspart  6 Units Subcutaneous Q supper  . insulin aspart protamine- aspart  8 Units Subcutaneous Q breakfast  . metFORMIN  1,000 mg Oral BID WC  . montelukast  10 mg Oral QHS  . pantoprazole  40 mg Oral Daily  . senna-docusate  2 tablet Oral Daily  . thiamine  100 mg Oral Daily   Continuous Infusions: . sodium chloride 10 mL/hr at 02/10/21 1230    Principal Problem:   Bipolar 1 disorder, depressed, full remission (HCC) Active Problems:   Bipolar 1 disorder (HCC)   Hypertension   Generalized weakness   Controlled type 2 diabetes mellitus with hyperglycemia, with long-term current use of insulin (HCC)   Hyperlipidemia   NPH (normal pressure hydrocephalus) (HCC)   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   Consultants:  Psychiatry  Procedures:  None  Antibiotics: Anti-infectives (From admission, onward)   None       Time spent: 35 minutes    [name] ANP  [facility] 7 am - 330 pm/M-F for direct patient care and secure chat Please refer to Amion for contact info 31  days

## 2021-02-12 NOTE — Plan of Care (Signed)
Patient remains pleasantly confused. Safety precautions maintained. 

## 2021-02-13 ENCOUNTER — Inpatient Hospital Stay (HOSPITAL_COMMUNITY): Payer: Medicaid Other

## 2021-02-13 LAB — GLUCOSE, CAPILLARY
Glucose-Capillary: 443 mg/dL — ABNORMAL HIGH (ref 70–99)
Glucose-Capillary: 515 mg/dL (ref 70–99)
Glucose-Capillary: 345 mg/dL — ABNORMAL HIGH (ref 70–99)
Glucose-Capillary: 281 mg/dL — ABNORMAL HIGH (ref 70–99)
Glucose-Capillary: 75 mg/dL (ref 70–99)
Glucose-Capillary: 119 mg/dL — ABNORMAL HIGH (ref 70–99)

## 2021-02-13 LAB — URINALYSIS, ROUTINE W REFLEX MICROSCOPIC
Bilirubin Urine: NEGATIVE
Glucose, UA: >=500 mg/dL — AB
Hgb urine dipstick: NEGATIVE
Ketones, ur: 5 mg/dL — AB
Nitrite: POSITIVE — AB
Protein, ur: NEGATIVE mg/dL
Specific Gravity, Urine: 1.017 (ref 1.005–1.030)
WBC, UA: >50 WBC/hpf — ABNORMAL HIGH (ref 0–5)
pH: 5 (ref 5.0–8.0)

## 2021-02-13 MED ORDER — SODIUM CHLORIDE 0.9 % IV SOLN
1.0000 g | INTRAVENOUS | Status: DC
  Administered 2021-02-13 – 2021-02-15 (×3): 1 g via INTRAVENOUS
  Filled 2021-02-13 (×4): qty 10

## 2021-02-13 MED ORDER — SODIUM CHLORIDE 0.9 % IV SOLN: INTRAVENOUS | Status: DC

## 2021-02-13 MED ORDER — INSULIN ASPART 100 UNIT/ML IJ SOLN
15.0000 [IU] | Freq: Once | INTRAMUSCULAR | Status: AC
  Administered 2021-02-13: 15 [IU] via SUBCUTANEOUS

## 2021-02-13 MED ORDER — INSULIN ASPART PROT & ASPART (70-30 MIX) 100 UNIT/ML ~~LOC~~ SUSP
10.0000 [IU] | Freq: Every day | SUBCUTANEOUS | Status: DC
  Administered 2021-02-13: 10 [IU] via SUBCUTANEOUS

## 2021-02-13 MED ORDER — INSULIN ASPART PROT & ASPART (70-30 MIX) 100 UNIT/ML ~~LOC~~ SUSP: 8.0000 [IU] | Freq: Every day | SUBCUTANEOUS | Status: DC

## 2021-02-13 NOTE — Progress Notes (Signed)
Inpatient Diabetes Program Recommendations  AACE/ADA: New Consensus Statement on Inpatient Glycemic Control (2015)  Target Ranges:  Prepandial:   less than 140 mg/dL      Peak postprandial:   less than 180 mg/dL (1-2 hours)      Critically ill patients:  140 - 180 mg/dL   Lab Results  Component Value Date   GLUCAP 281 (H) 02/13/2021   HGBA1C 11.0 (H) 01/12/2021    Review of Glycemic Control Results for Carolyn Norton, Carolyn Norton (MRN [id number]) as of 02/13/2021 13:15  Ref. Range 02/13/2021 04:39 02/13/2021 07:24 02/13/2021 10:07 02/13/2021 11:11  Glucose-Capillary Latest Ref Range: 70 - 99 mg/dL 989 (H) 211 (HH) 941 (H) 281 (H)   Diabetes history:  DM2 Outpatient Diabetes medications:  Lantus 45 units QHS Current orders for Inpatient glycemic control:  70/30 10 units QAM, 8 units QPM Novolog 0-9 units Q4H Metformin 1000 mg BID Novolog 15 units x1    Inpatient Diabetes Program Recommendations:    Noted one time dose of Novolog this AM due to hyperglycemia of 515 mg/dL. Of note, patient missed correction at 0000 and consumed nutritional supplement. Based on trends yesterday, patient at increased risk of hypoglycemia this afternoon. Would recommend continuing with current orders at this time.  Will follow based on changes.  Thanks, Lujean Rave, MSN, RNC-OB Diabetes Coordinator [phone] (8a-5p)

## 2021-02-13 NOTE — Progress Notes (Signed)
UA returned - appears c/w UTI- positive for nitrate, large leukocytes, WBC >50 and positive for bacteria. Will initiate IV Rocephin, follow up on urine cx and blood cultures- adjust antibiotics if necessary based on cx results

## 2021-02-13 NOTE — Progress Notes (Addendum)
[facility] PROGRESS NOTE  Carolyn Norton NAT:[id number] DOB: June 12, 1966 DOA: 01/11/2021 PCP: [name], FNP  Status: Remains inpatient appropriate because:Unsafe d/c plan  Dispo:  Patient From:  Home  Planned Disposition:  Home-family has agreed to take patient back when she is medically stable and able to mobilize independently with minimal assistance.  Medically stable for discharge:  NO  Barriers to DC: Continues to require significant assistance with mobility.             Difficult to place: Yes  Level of care: Med-Surg  Code Status: DNR Family Communication: 5/30 father-regarding possible NPH and dc planning diagnosis-6/3 updated by TOC re dc plans DVT prophylaxis: Lovenox COVID vaccination status: Unknown   HPI: 55 year old female past medical history of diabetes mellitus type 2, bipolar disorder type I, previous COVID infection, polysubstance abuse that includes cocaine.  She presented from home where she lives with her parents.  Her presenting symptoms included generalized weakness, multiple falls and confusion.  Family confirmed that she had been having the symptoms for at least a month with last use of crack cocaine 1 month prior to presentation.  Ever since that time she had not been doing well.  She would also be having issues with hyperglycemia.  In the ER she was diagnosed with severe dehydration, hyperglycemia and metabolic encephalopathy.  Since admission patient has been evaluated by the psychiatric team who has deemed her alert and oriented and having capacity to make decisions.  Patient is agreeable to placement at a rehab.  Currently she has severe physical deconditioning which was POA and PT is recommending SNF.  Because of her history of polysubstance abuse and lack of funding SNF is not an option.  Family is agreeable to taking her back in home when she is able to ambulate well enough to get back and forth for meals into the  bathroom.  Subjective: Awake and much more confused today but remains pleasant.  She denies coughing or choking with eating.  She denies dysuria symptoms although states that she really cannot remember.  Objective: Vitals:   02/12/21 2032 02/13/21 0446  BP: 127/76 (!) 143/70  Pulse: 78 76  Resp: 16 19  Temp: 98.2 F (36.8 C) (!) 97.5 F (36.4 C)  SpO2: 93% 99%    Intake/Output Summary (Last 24 hours) at 02/13/2021 0747 Last data filed at 02/13/2021 0442 Gross per 24 hour  Intake 697.94 ml  Output 800 ml  Net -102.06 ml   Filed Weights   01/11/21 1152  Weight: 99.8 kg    Exam:  Constitutional: Calm and in no acute distress and appears to be comfortable Respiratory: Anterior lung sounds are clear to auscultation, no increased work of breathing.  Room air durations variable between 93 and 100% Cardiac: 2, no peripheral edema, regular pulse. Abdomen: LBM 6/6, soft nontender to palpation.  Normoactive bowel sounds.  Poor oral intake Neurologic: CN 2-12 grossly intact. Sensation intact, Strength 4/5 x all 4 extremities.   Psychiatric: Alert and oriented to name and place only not to year, season or time of day    Assessment/Plan: Acute problems: Acute metabolic encephalopathy (multifactorial): Severe dehydration in context of poorly controlled diabetes mellitus with hyperglycemia Initially felt to be secondary to cocaine withdrawal and poor nutrition POA contributed-of note family later confirmed patient with significant change in ability to manage care and ambulate after COVID infection. Acute encephalopathy resolved but persistent cognitive and gait issues.  Symptoms related to NPH (see below) with notable improvements  after large-volume LP last week  Ventriculomegaly/? NPH Significantly abnormal cognitive screening tests (short Blessed test and SLUMS)  Appreciate assistance of neurology and neurosurgery. On 6/3 after large-volume LP patient did gain some improvement in  mobility and mentation. Neurosurgery plans LP drain trial 6/10  Anorexia/constipation Poor oral intake/poor appetite has reemerged over the past several days and now she is requiring IV fluids to maintain adequate hydration  Diabetes mellitus 2 with hyperglycemia on insulin Hemoglobin A1c 11.0 Continue metformin to 1000 mg BID 6/10 increase 70/30 insulin 10 units with breakfast and increase to 8 units with supper -apparently pt had cinsumed a protein beverage just prior to AM CBG CBG greater than 500 this a.m. so was given 15 units of NovoLog x1 Poor oral intake so we will resume IV fluids at 75 cc/h Meal coverage discontinued since intake remains poor/variable Continue SSI Due to unexplained hyperglycemia we will rule out occult infection by checking blood cultures, urinalysis and culture and chest x-ray  Dyslipidemia Continue statin  History of bipolar 1 disorder/polysubstance abuse/cognitive impairment Continue escitalopram, Neurontin and Depakote Cleared by psych regarding orientation and capacity Functional cognitive evaluation per SLP and Occupational Therapy with significant abnormalities Will need to have repeat cognitive evaluations after VP shunt placed to ensure no residual abnormalities   Other problems: Acute kidney injury on CKD 3a Acute kidney injury resolved after IV fluids Baseline creatinine 1.3  COPD Continue Singulair, nebs as needed, consider adding LABA  GERD Continue PPI  Hypokalemia/hypomagnesemia Resolved  Data Reviewed: Basic Metabolic Panel: Recent Labs  Lab 02/12/21 0811  NA 133*  K 5.2*  CL 96*  CO2 29  GLUCOSE 607*  BUN 16  CREATININE 1.02*  CALCIUM 8.8*   Liver Function Tests: No results for input(s): AST, ALT, ALKPHOS, BILITOT, PROT, ALBUMIN in the last 168 hours. No results for input(s): LIPASE, AMYLASE in the last 168 hours. No results for input(s): AMMONIA in the last 168 hours. CBC: No results for input(s): WBC, NEUTROABS,  HGB, HCT, MCV, PLT in the last 168 hours. Cardiac Enzymes: No results for input(s): CKTOTAL, CKMB, CKMBINDEX, TROPONINI in the last 168 hours. BNP (last 3 results) Recent Labs    01/14/21 0238 01/15/21 0230 01/16/21 0201  BNP 177.8* 115.9* 40.9    ProBNP (last 3 results) No results for input(s): PROBNP in the last 8760 hours.  CBG: Recent Labs  Lab 02/12/21 1132 02/12/21 1615 02/12/21 2035 02/13/21 0439 02/13/21 0724  GLUCAP 320* 87 147* 443* 515*    Recent Results (from the past 240 hour(s))  CSF culture w Gram Stain     Status: None   Collection Time: 02/05/21  2:25 PM   Specimen: PATH Cytology CSF; Cerebrospinal Fluid  Result Value Ref Range Status   Specimen Description CSF  Final   Special Requests NONE  Final   Gram Stain NO WBC SEEN NO ORGANISMS SEEN   Final   Culture   Final    NO GROWTH 3 DAYS Performed at [facility], [address], [place], [place] [address]    Report Status 02/09/2021 FINAL  Final  CSF culture w Gram Stain     Status: None   Collection Time: 02/06/21 11:08 AM   Specimen: PATH Cytology CSF; Cerebrospinal Fluid  Result Value Ref Range Status   Specimen Description CSF  Final   Special Requests NONE  Final   Gram Stain   Final    WBC PRESENT,BOTH PMN AND MONONUCLEAR NO ORGANISMS SEEN CYTOSPIN SMEAR  Culture   Final    NO GROWTH 3 DAYS Performed at [facility], [address] [place], [place] [address]    Report Status 02/09/2021 FINAL  Final     Studies: No results found.  Scheduled Meds:  atorvastatin  20 mg Oral Daily   cholecalciferol  1,000 Units Oral Daily   divalproex  500 mg Oral BID   enoxaparin (LOVENOX) injection  40 mg Subcutaneous Daily   escitalopram  20 mg Oral Daily   feeding supplement (GLUCERNA SHAKE)  237 mL Oral TID AC & HS   gabapentin  300 mg Oral BID   insulin aspart  0-9 Units Subcutaneous Q4H   insulin aspart  15 Units Subcutaneous Once   [START ON 02/14/2021] insulin aspart  protamine- aspart  10 Units Subcutaneous Q breakfast   insulin aspart protamine- aspart  8 Units Subcutaneous Q supper   metFORMIN  1,000 mg Oral BID WC   montelukast  10 mg Oral QHS   pantoprazole  40 mg Oral Daily   senna-docusate  2 tablet Oral Daily   thiamine  100 mg Oral Daily   Continuous Infusions:  sodium chloride      Principal Problem:   Bipolar 1 disorder, depressed, full remission (HCC) Active Problems:   Bipolar 1 disorder (HCC)   Hypertension   Generalized weakness   Controlled type 2 diabetes mellitus with hyperglycemia, with long-term current use of insulin (HCC)   Hyperlipidemia   NPH (normal pressure hydrocephalus) (HCC)   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

## 2021-02-13 NOTE — Plan of Care (Signed)
Patient remains pleasantly confused. Safety precautions maintained.

## 2021-02-13 NOTE — Progress Notes (Signed)
   Providing Compassionate, Quality Care - Together  NEUROSURGERY PROGRESS NOTE   S: No issues overnight.  O: EXAM:  BP (!) 103/91   Pulse 76   Temp 98.4 F (36.9 C) (Oral)   Resp 19   Ht 5\' 6"  (1.676 m)   Wt 99.8 kg   LMP 01/26/2017 (Approximate)   SpO2 97%   BMI 35.51 kg/m   Awake, alert, oriented x2 PERRL Speech fluent, easily distracted  CNs grossly intact  5/5 BUE/BLE   ASSESSMENT:  55 y.o. female with   Ventriculomegaly  ? Etiology/NPH  PLAN: - LD trial tomorrow -NPO midnight -dc lovenox -continue to hold aspirin -Discussed all risks, benefits and expected outcomes with the patient's mother.  She agrees to proceed with a lumbar drain trial.  She states that since December when she recovered from COVID she has had continued memory changes, difficulty walking and challenges with her mobility as well as urinary incontinence.  I explained to her the possibility that this could be normal pressure hydrocephalus and that if she improved with the lumbar drain trial then placement of a ventriculoperitoneal shunt may be warranted.      Thank you for allowing me to participate in this patient's care.  Please do not hesitate to call with questions or concerns.   January, DO Neurosurgeon [facility] Cell: [phone]

## 2021-02-13 NOTE — Progress Notes (Signed)
Physical Therapy Treatment Patient Details Name: Carolyn Norton MRN: [id number] DOB: 03/31/1966 Today's Date: 02/13/2021    History of Present Illness Pt presents from home on 01/13/21 with multiple falls, confusion, and weakness. In ED blood glucose was 729 and pt was unable to ambulate.  Patient admitted with severe dehydration and metabolic encephalopathy. PMH: DM2, COPD, CKD3, GERD, bipolar disorder type 1, previous covid 19 infection, polysubstance abuse including cocaine.    PT Comments    Patient awake and alert, however continues with very slow processing to follow commands. Unable to progress to standing due to strong posterior lean in sitting. Noted plans for lumber drain 6/10 and will reassess her functional needs after this. Noted also family wants pt to return home if able.      Follow Up Recommendations  Home health PT (family wants to take pt home; TBA after lumbar drain; SNF not an option)     Equipment Recommendations  Rolling walker with 5" wheels;3in1 (PT);Hospital bed;Wheelchair (measurements PT);Other (comment) (hoyer lift; *reassess after lumbar drain)    Recommendations for Other Services       Precautions / Restrictions Precautions Precautions: Fall    Mobility  Bed Mobility Overal bed mobility: Needs Assistance Bed Mobility: Rolling;Sidelying to Sit;Sit to Sidelying Rolling: Mod assist Sidelying to sit: Max assist     Sit to sidelying: Mod assist General bed mobility comments: very happy and slowly responds to commands    Transfers                 General transfer comment: pt with strong posterior lean in sitting and only able to work on sitting balance, not transfers  Ambulation/Gait                 Stairs             Wheelchair Mobility    Modified Rankin (Stroke Patients Only)       Balance Overall balance assessment: Needs assistance;History of Falls Sitting-balance support: Feet supported;Bilateral upper  extremity supported Sitting balance-Leahy Scale: Zero Sitting balance - Comments: up to max assist to regain midline/upright as pt with strong posterior lean; worked on trunk rotation with improvement to minguard assist for ~10 seconds and then posterior lean returned; continued to work on forward reaching/bending at hips with pt resisting but denies fear of falling Postural control: Posterior lean                                  Cognition Arousal/Alertness: Awake/alert Behavior During Therapy: Flat affect Overall Cognitive Status: Impaired/Different from baseline Area of Impairment: Orientation;Attention;Memory;Following commands;Safety/judgement;Awareness;Problem solving                 Orientation Level: Disoriented to;Place;Time;Situation Current Attention Level: Sustained;Focused Memory: Decreased short-term memory Following Commands: Follows one step commands inconsistently;Follows one step commands with increased time Safety/Judgement: Decreased awareness of safety;Decreased awareness of deficits Awareness: Intellectual Problem Solving: Slow processing;Difficulty sequencing;Requires verbal cues;Decreased initiation;Requires tactile cues General Comments: Continued decreased attention to tasks  requiring constant cuing to perform.      Exercises      General Comments        Pertinent Vitals/Pain Pain Assessment: Faces Faces Pain Scale: No hurt    Home Living                      Prior Function  PT Goals (current goals can now be found in the care plan section) Acute Rehab PT Goals Patient Stated Goal: To go home. Time For Goal Achievement: 02/25/21 Potential to Achieve Goals: Fair Progress towards PT goals: Not progressing toward goals - comment    Frequency    Min 3X/week      PT Plan Discharge plan needs to be updated;Frequency needs to be updated    Co-evaluation              AM-PAC PT "6 Clicks"  Mobility   Outcome Measure  Help needed turning from your back to your side while in a flat bed without using bedrails?: Total Help needed moving from lying on your back to sitting on the side of a flat bed without using bedrails?: Total Help needed moving to and from a bed to a chair (including a wheelchair)?: Total Help needed standing up from a chair using your arms (e.g., wheelchair or bedside chair)?: Total Help needed to walk in hospital room?: Total Help needed climbing 3-5 steps with a railing? : Total 6 Click Score: 6    End of Session   Activity Tolerance: Treatment limited secondary to medical complications (Comment) (decr cognition) Patient left: with call bell/phone within reach;in chair;with chair alarm set   PT Visit Diagnosis: Muscle weakness (generalized) (M62.81);Repeated falls (R29.6);Difficulty in walking, not elsewhere classified (R26.2);Unsteadiness on feet (R26.81)     Time: 1416-1440 PT Time Calculation (min) (ACUTE ONLY): 24 min  Charges:  $Therapeutic Activity: 23-37 mins                      Carolyn Norton, PT Pager [phone]    Zena Amos 02/13/2021, 3:02 PM

## 2021-02-14 ENCOUNTER — Encounter (HOSPITAL_COMMUNITY)
Admission: EM | Disposition: A | Discharge status: Skilled Nursing Facility | Payer: Self-pay | Source: Home / Self Care | Attending: Internal Medicine

## 2021-02-14 LAB — GLUCOSE, CAPILLARY
Glucose-Capillary: 182 mg/dL — ABNORMAL HIGH (ref 70–99)
Glucose-Capillary: 183 mg/dL — ABNORMAL HIGH (ref 70–99)
Glucose-Capillary: 203 mg/dL — ABNORMAL HIGH (ref 70–99)
Glucose-Capillary: 307 mg/dL — ABNORMAL HIGH (ref 70–99)
Glucose-Capillary: 64 mg/dL — ABNORMAL LOW (ref 70–99)
Glucose-Capillary: 81 mg/dL (ref 70–99)
Glucose-Capillary: 81 mg/dL (ref 70–99)
Glucose-Capillary: 99 mg/dL (ref 70–99)

## 2021-02-14 LAB — APTT: aPTT: 27 seconds (ref 24–36)

## 2021-02-14 LAB — BASIC METABOLIC PANEL
Anion gap: 9 (ref 5–15)
BUN: 13 mg/dL (ref 6–20)
CO2: 27 mmol/L (ref 22–32)
Calcium: 9.1 mg/dL (ref 8.9–10.3)
Chloride: 103 mmol/L (ref 98–111)
Creatinine, Ser: 0.98 mg/dL (ref 0.44–1.00)
GFR, Estimated: >60 mL/min (ref >60)
Glucose, Bld: 137 mg/dL — ABNORMAL HIGH (ref 70–99)
Potassium: 4.5 mmol/L (ref 3.5–5.1)
Sodium: 139 mmol/L (ref 135–145)

## 2021-02-14 LAB — CBC
HCT: 34.8 % — ABNORMAL LOW (ref 36.0–46.0)
Hemoglobin: 11.3 g/dL — ABNORMAL LOW (ref 12.0–15.0)
MCH: 29.4 pg (ref 26.0–34.0)
MCHC: 32.5 g/dL (ref 30.0–36.0)
MCV: 90.6 fL (ref 80.0–100.0)
Platelets: 159 10*3/uL (ref 150–400)
RBC: 3.84 MIL/uL — ABNORMAL LOW (ref 3.87–5.11)
RDW: 15.7 % — ABNORMAL HIGH (ref 11.5–15.5)
WBC: 7.7 10*3/uL (ref 4.0–10.5)
nRBC: 0 % (ref 0.0–0.2)

## 2021-02-14 LAB — PROTIME-INR
INR: 1 (ref 0.8–1.2)
Prothrombin Time: 13.1 seconds (ref 11.4–15.2)

## 2021-02-14 SURGERY — PLACEMENT OF LUMBAR DRAIN
Anesthesia: Monitor Anesthesia Care

## 2021-02-14 MED ORDER — INSULIN ASPART PROT & ASPART (70-30 MIX) 100 UNIT/ML ~~LOC~~ SUSP
6.0000 [IU] | Freq: Every day | SUBCUTANEOUS | Status: DC
  Administered 2021-02-14 – 2021-02-19 (×6): 6 [IU] via SUBCUTANEOUS

## 2021-02-14 MED ORDER — VANCOMYCIN HCL 1000 MG/200ML IV SOLN
1000.0000 mg | INTRAVENOUS | Status: DC
  Filled 2021-02-14: qty 200

## 2021-02-14 MED ORDER — INSULIN ASPART PROT & ASPART (70-30 MIX) 100 UNIT/ML ~~LOC~~ SUSP
4.0000 [IU] | Freq: Every day | SUBCUTANEOUS | Status: DC
  Administered 2021-02-14 – 2021-02-18 (×5): 4 [IU] via SUBCUTANEOUS

## 2021-02-14 NOTE — Progress Notes (Signed)
Inpatient Diabetes Program Recommendations  AACE/ADA: New Consensus Statement on Inpatient Glycemic Control (2015)  Target Ranges:  Prepandial:   less than 140 mg/dL      Peak postprandial:   less than 180 mg/dL (1-2 hours)      Critically ill patients:  140 - 180 mg/dL   Lab Results  Component Value Date   GLUCAP 307 (H) 02/14/2021   HGBA1C 11.0 (H) 01/12/2021    Diabetes history:  DM2 Outpatient Diabetes medications:  Lantus 45 units QHS Current orders for Inpatient glycemic control:  70/30 6 units QAM, 4 units QPM Novolog 0-9 units Q4H Metformin 1000 mg BID Novolog 15 units x1    Inpatient Diabetes Program Recommendations:    If oral intake continues to remain poor would consider switching to Levemir 8 units QHS.   Thanks, Lujean Rave, MSN, RNC-OB Diabetes Coordinator [phone] (8a-5p)

## 2021-02-14 NOTE — Progress Notes (Addendum)
[facility] PROGRESS NOTE  Carolyn Norton KXF:[id number] DOB: 02-19-1966 DOA: 01/11/2021 PCP: [name], FNP  Status: Remains inpatient appropriate because:Unsafe d/c plan  Dispo:  Patient From:  Home  Planned Disposition:  Home-family has agreed to take patient back when she is medically stable and able to mobilize independently with minimal assistance.  Medically stable for discharge:  NO  Barriers to DC: Continues to require significant assistance with mobility.             Difficult to place: Yes  Level of care: Med-Surg  Code Status: DNR Family Communication: 6/10 Mother [name] updated DVT prophylaxis: Lovenox COVID vaccination status: Unknown   HPI: 55 year old female past medical history of diabetes mellitus type 2, bipolar disorder type I, previous COVID infection, polysubstance abuse that includes cocaine.  She presented from home where she lives with her parents.  Her presenting symptoms included generalized weakness, multiple falls and confusion.  Family confirmed that she had been having the symptoms for at least a month with last use of crack cocaine 1 month prior to presentation.  Ever since that time she had not been doing well.  She would also be having issues with hyperglycemia.  In the ER she was diagnosed with severe dehydration, hyperglycemia and metabolic encephalopathy.  Since admission patient has been evaluated by the psychiatric team who has deemed her alert and oriented and having capacity to make decisions.  Patient is agreeable to placement at a rehab.  Currently she has severe physical deconditioning which was POA and PT is recommending SNF.  Because of her history of polysubstance abuse and lack of funding SNF is not an option.  Family is agreeable to taking her back in home when she is able to ambulate well enough to get back and forth for meals into the bathroom.  Subjective: Patient alert and pleasant and remains confused.  Denies UTI  symptoms.  Objective: Vitals:   02/13/21 2020 02/14/21 0500  BP: 101/78 120/66  Pulse: 76 73  Resp: 16   Temp: 97.9 F (36.6 C) 97.6 F (36.4 C)  SpO2: 97% 96%    Intake/Output Summary (Last 24 hours) at 02/14/2021 6967 Last data filed at 02/13/2021 1600 Gross per 24 hour  Intake 738.39 ml  Output --  Net 738.39 ml   Filed Weights   01/11/21 1152  Weight: 99.8 kg    Exam:  Constitutional:, Calm, no acute distress Respiratory: Lungs are clear to auscultation and she remained stable on room air without any increased work of breathing, room air sats 95 to 100% Cardiac: Normal heart sounds, no peripheral edema, regular pulse Abdomen: LBM 6/6, soft nontender nondistended.  NPO but has had poor oral intake over the past several days Neurologic: CN 2-12 grossly intact. Sensation intact, Strength 4/5 x all 4 extremities.   Psychiatric: Alert and oriented to name place and president only she was unable to tell me the year correctly after 2 tries, unable to tell me the month and unable to tell me the season    Assessment/Plan: Acute problems: Acute metabolic encephalopathy (multifactorial): Severe dehydration in context of poorly controlled diabetes mellitus with hyperglycemia Initially felt to be secondary to cocaine withdrawal and poor nutrition POA  Acute encephalopathy resolved but persistent cognitive and gait issues that appear to be consistent with possible NPH seen on imaging (see below)  -Notable improvements after large-volume LP last week but has had progressive worsening in symptoms since that time  Ventriculomegaly/? NPH Significantly abnormal cognitive screening tests (  short Blessed test and SLUMS)  Appreciate assistance of neurology and neurosurgery. On 6/3 after large-volume LP patient did gain some improvement in mobility and mentation. Neurosurgery had planned LP drain trial 6/10 but has placed on hold given acute UTI.  Consideration given to performing as an  outpatient but patient will remain in the hospital at least for several weeks while we attempt to procure an SNF bed-hopeful after treating UTI for 3 days can proceed next week with LP drain trial. **Neurosurgical team/neurosurgeon has documented that NPH is not emergent and does not need a LD trial before placement to SNF.  She can be followed up in the outpatient setting after discharge if a bed is available before can arrange for an LD trial**  Anorexia/constipation Poor oral intake/poor appetite has reemerged over the past several days and now she is requiring IV fluids to maintain adequate hydration  Abnormal urinalysis Patient having significant bouts of hyperglycemia not explained by dietary intake especially given poor oral intake Urinalysis markedly abnormal and concerning for UTI 6/9 empiric IV Rocephin initiated Urine and blood cultures are pending  Diabetes mellitus 2 with hyperglycemia on insulin Hemoglobin A1c 11.0 CBGs significantly improved after initiation of Rocephin for UTI therefore suspect infection was driving force behind hyperglycemia Will decrease a.m. 70/30 to 6 units in p.m. 70/30 to 4 units ABGs since yesterday evening have ranged between 75 and 182 Continue metformin to 1000 mg BID Poor oral intake so we will resume IV fluids at 75 cc/h Meal coverage discontinued since intake remains poor/variable Continue SSI  Dyslipidemia Continue statin  History of bipolar 1 disorder/polysubstance abuse/cognitive impairment Continue escitalopram, Neurontin and Depakote Cleared by psych regarding orientation and capacity Functional cognitive evaluation per SLP and Occupational Therapy with significant abnormalities Will need to have repeat cognitive evaluations after VP shunt placed to ensure no residual abnormalities   Other problems: Acute kidney injury on CKD 3a Acute kidney injury resolved after IV fluids Baseline creatinine 1.3  COPD Continue Singulair, nebs as  needed, consider adding LABA  GERD Continue PPI  Hypokalemia/hypomagnesemia Resolved  Data Reviewed: Basic Metabolic Panel: Recent Labs  Lab 02/12/21 0811  NA 133*  K 5.2*  CL 96*  CO2 29  GLUCOSE 607*  BUN 16  CREATININE 1.02*  CALCIUM 8.8*   Liver Function Tests: No results for input(s): AST, ALT, ALKPHOS, BILITOT, PROT, ALBUMIN in the last 168 hours. No results for input(s): LIPASE, AMYLASE in the last 168 hours. No results for input(s): AMMONIA in the last 168 hours. CBC: No results for input(s): WBC, NEUTROABS, HGB, HCT, MCV, PLT in the last 168 hours. Cardiac Enzymes: No results for input(s): CKTOTAL, CKMB, CKMBINDEX, TROPONINI in the last 168 hours. BNP (last 3 results) Recent Labs    01/14/21 0238 01/15/21 0230 01/16/21 0201  BNP 177.8* 115.9* 40.9    ProBNP (last 3 results) No results for input(s): PROBNP in the last 8760 hours.  CBG: Recent Labs  Lab 02/13/21 1111 02/13/21 1548 02/13/21 2210 02/14/21 0223 02/14/21 0602  GLUCAP 281* 75 119* 182* 81    Recent Results (from the past 240 hour(s))  CSF culture w Gram Stain     Status: None   Collection Time: 02/05/21  2:25 PM   Specimen: PATH Cytology CSF; Cerebrospinal Fluid  Result Value Ref Range Status   Specimen Description CSF  Final   Special Requests NONE  Final   Gram Stain NO WBC SEEN NO ORGANISMS SEEN   Final   Culture  Final    NO GROWTH 3 DAYS Performed at [facility], [address], [place], [place] [address]    Report Status 02/09/2021 FINAL  Final  CSF culture w Gram Stain     Status: None   Collection Time: 02/06/21 11:08 AM   Specimen: PATH Cytology CSF; Cerebrospinal Fluid  Result Value Ref Range Status   Specimen Description CSF  Final   Special Requests NONE  Final   Gram Stain   Final    WBC PRESENT,BOTH PMN AND MONONUCLEAR NO ORGANISMS SEEN CYTOSPIN SMEAR    Culture   Final    NO GROWTH 3 DAYS Performed at [facility], [address],  [place], [place] [address]    Report Status 02/09/2021 FINAL  Final     Studies: DG Chest 2 View  Result Date: 02/13/2021 CLINICAL DATA:  Shortness of breath EXAM: CHEST - 2 VIEW COMPARISON:  Jan 11, 2021 FINDINGS: The lungs are clear. The heart size and pulmonary vascularity are normal. No adenopathy. There is degenerative change in the thoracic spine. IMPRESSION: Lungs clear.  Cardiac silhouette normal. Electronically Signed   By: Bretta Bang III M.D.   On: 02/13/2021 11:34    Scheduled Meds:  atorvastatin  20 mg Oral Daily   cholecalciferol  1,000 Units Oral Daily   divalproex  500 mg Oral BID   escitalopram  20 mg Oral Daily   feeding supplement (GLUCERNA SHAKE)  237 mL Oral TID AC & HS   gabapentin  300 mg Oral BID   insulin aspart  0-9 Units Subcutaneous Q4H   insulin aspart protamine- aspart  10 Units Subcutaneous Q breakfast   insulin aspart protamine- aspart  8 Units Subcutaneous Q supper   metFORMIN  1,000 mg Oral BID WC   montelukast  10 mg Oral QHS   pantoprazole  40 mg Oral Daily   senna-docusate  2 tablet Oral Daily   thiamine  100 mg Oral Daily   Continuous Infusions:  sodium chloride 75 mL/hr at 02/13/21 0810   cefTRIAXone (ROCEPHIN)  IV 1 g (02/13/21 2314)    Principal Problem:   Bipolar 1 disorder, depressed, full remission (HCC) Active Problems:   Bipolar 1 disorder (HCC)   Hypertension   Generalized weakness   Controlled type 2 diabetes mellitus with hyperglycemia, with long-term current use of insulin (HCC)   Hyperlipidemia   NPH (normal pressure hydrocephalus) (HCC)   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   Consultants: Psychiatry  Procedures: None  Antibiotics: Anti-infectives (From admission, onward)    Start     Dose/Rate Route Frequency Ordered Stop   02/13/21 2115  cefTRIAXone (ROCEPHIN) 1 g in sodium chloride 0.9 % 100 mL IVPB        1 g 200 mL/hr over 30 Minutes Intravenous Every 24 hours 02/13/21 2016          Time spent: 25  minutes    [name] ANP  [facility] 7 am - 330 pm/M-F for direct patient care and secure chat Please refer to Amion for contact info 33  days

## 2021-02-14 NOTE — Progress Notes (Signed)
   Providing Compassionate, Quality Care - Together  NEUROSURGERY PROGRESS NOTE   S: UTI diagnosed yesterday, no new complaints  O: EXAM:  BP 120/66 (BP Location: Left Leg)   Pulse 73   Temp 97.6 F (36.4 C) (Oral)   Resp 16   Ht 5\' 6"  (1.676 m)   Wt 99.8 kg   LMP 01/26/2017 (Approximate)   SpO2 95%   BMI 35.51 kg/m   Awake, alert, oriented x2 PERRL Speech fluent CNs grossly intact  5/5 BUE/BLE   ASSESSMENT:  55 y.o. female with   ? NPH/Ventriculomegaly  PLAN: - cancel LD drain since new UTI -Recommend follow-up as an outpatient for further work-up -I have due to the patient and the primary care team, will call the mother -Follow-up in approximately 1 month    Thank you for allowing me to participate in this patient's care.  Please do not hesitate to call with questions or concerns.   40, DO Neurosurgeon [facility] Cell: [phone]

## 2021-02-14 NOTE — Progress Notes (Deleted)
UA returned - appears c/w UTI- positive for nitrate, large leukocytes, WBC >50 and positive for bacteria. Will initiate IV Rocephin, follow up on urine cx and blood cultures- adjust antibiotics if necessary based on cx results

## 2021-02-14 NOTE — TOC Progression Note (Addendum)
Transition of Care [facility]) - Progression Note    Patient Details  Name: Carolyn Norton MRN: [id number] Date of Birth: 07-08-1966  [facility] CM/SW Contact  Janae Bridgeman, RN Phone Number: 02/14/2021, 12:26 PM  Clinical Narrative:    Case management continues to follow the patient for transitions of care needs.  The patient is being considered for Lumbar drain trial on outpatient basis.  I spoke with Rennis Harding, NP and patient is medically clear to be able to transfer to an accepting skilled nursing facility.  At this time, there are currently no bed offers.  I called and left a message with [name], CM for [facility] SN[facility] facilities and asked that she review the patient's clinicals for possible bed offer at one of her SNF facilities.  The patient will need to be approved for LOG and placement.    CM and MSW will continue to follow the patient for discharge needs.  02/14/2021 - CM spoke with Loyce Dys, CM at business office at [facility] and the facility is considering offering a bed to the patient.  Arian, CM is going to call and speak with the patient's mother on the phone and discuss possible placement for the patient.  I compiled clinicals for the patient and will send clinicals to obtain PASSR for the patient.   Expected Discharge Plan: Skilled Nursing Facility Barriers to Discharge: Family Issues, Continued Medical Work up  Expected Discharge Plan and Services Expected Discharge Plan: Skilled Nursing Facility In-house Referral: Clinical Social Work Discharge Planning Services: CM Consult Post Acute Care Choice: Skilled Nursing Facility Living arrangements for the past 2 months: Single Family Home Expected Discharge Date: 01/16/21               DME Arranged: [name] rolling DME Agency: [employer] Date DME Agency Contacted: 01/20/21 Time DME Agency Contacted: 1435 Representative spoke with at DME Agency: [name]             Social  Determinants of Health (SDOH) Interventions    Readmission Risk Interventions Readmission Risk Prevention Plan 01/14/2021  Transportation Screening Complete  PCP or Specialist Appt within 3-5 Days (No Data)  Social Work Consult for Recovery Care Planning/Counseling Complete  Palliative Care Screening Not Applicable  Medication Review Oceanographer) Referral to Pharmacy  Some recent data might be hidden

## 2021-02-14 NOTE — Progress Notes (Signed)
Occupational Therapy Treatment Patient Details Name: Carolyn Norton MRN: [id number] DOB: 09-01-1966 Today's Date: 02/14/2021    History of present illness Pt presents from home on 01/13/21 with multiple falls, confusion, and weakness. In ED blood glucose was 729 and pt was unable to ambulate.  Patient admitted with severe dehydration and metabolic encephalopathy. PMH: DM2, COPD, CKD3, GERD, bipolar disorder type 1, previous covid 19 infection, polysubstance abuse including cocaine.   OT comments  Patient continues to make slow progress toward goals. Cognitively patient continues to be limited by decreased attention to task, decreased command following, decreased awareness, and memory deficits impacting functional performance. Patient with significant waxing/waning requiring up to +2 assist for bed mobility and functional transfers with mobility equipment. Patient also limited by continued heavy posterior lean seated EOB. Time spent working on anterior lean and reaching outside of BOS to promote trunk flexion in prep for sit to stand transfers. Patient seated in recliner with call bell within reach, chair alarm activated, and all needs met at conclusion of session.    Follow Up Recommendations  SNF;Supervision/Assistance - 24 hour    Equipment Recommendations  3 in 1 bedside commode    Recommendations for Other Services      Precautions / Restrictions Precautions Precautions: Fall Restrictions Weight Bearing Restrictions: No       Mobility Bed Mobility Overal bed mobility: Needs Assistance Bed Mobility: Supine to Sit Rolling: Min assist;Mod assist Sidelying to sit: Mod assist;Max assist       General bed mobility comments: Patient waxes and wanes. Could be Min to Mod A for rolling and Mod to Max A for sidelying to sit at EOB. Continues to require max multimodal cues for sequencing and attention to task.    Transfers Overall transfer level: Needs assistance   Transfers:  Sit to/from Stand;Stand Pivot Transfers Sit to Stand: Min assist;Max assist Stand pivot transfers: Total assist       General transfer comment: Patient can require Min to Mod A to stand with use of stedy. Continues to require max multimodal cues for hand placement, sequencing and attention to task.    Balance Overall balance assessment: Needs assistance;History of Falls Sitting-balance support: Feet supported;Bilateral upper extremity supported Sitting balance-Leahy Scale: Poor Sitting balance - Comments: Static sitting at EOB with Mod A at most and Min A at best. Able to maintain static sitting balance with close supervision A with bilateral hands on rail of Stedy. Requires max multimodal cues for sequencing/hand placement. Waxes and wanes with sitting balance. Continued posterior lean. Postural control: Posterior lean Standing balance support: During functional activity;Bilateral upper extremity supported Standing balance-Leahy Scale: Poor                             ADL either performed or assessed with clinical judgement   ADL Overall ADL's : Needs assistance/impaired                         Toilet Transfer: Total assistance Toilet Transfer Details (indicate cue type and reason): Simulated with transfer to recliner with use of Stedy. Patient able to stand with Min A in [place] with 1-step verbal commands for anterior shift and hand placement. Toileting- Clothing Manipulation and Hygiene: Total assistance Toileting - Clothing Manipulation Details (indicate cue type and reason): Total A for hygiene/clothing management at bed level after BM.       General ADL Comments: Patient continues to wax  and wane.     Vision       Perception     Praxis      Cognition Arousal/Alertness: Awake/alert Behavior During Therapy: WFL for tasks assessed/performed Overall Cognitive Status: Impaired/Different from baseline Area of Impairment:  Orientation;Attention;Memory;Following commands;Safety/judgement;Awareness;Problem solving                 Orientation Level: Disoriented to;Place;Time;Situation (Able to state city and state.) Current Attention Level: Sustained Memory: Decreased short-term memory Following Commands: Follows one step commands inconsistently;Follows one step commands with increased time Safety/Judgement: Decreased awareness of safety;Decreased awareness of deficits Awareness: Intellectual Problem Solving: Slow processing;Difficulty sequencing;Requires verbal cues;Decreased initiation;Requires tactile cues General Comments: Patient with better attention to tasks this date. Able to name 2 animals upon request to name 5. Named 3 additional animals with cueing. Patient able to correctly identify 3/3 ADL items and demo correct usage. Continued decreased attention to task.        Exercises Other Exercises Other Exercises: Sitting balance with reaching outside of BOS in prep for functional transfers. Focus on anterior lean.   Shoulder Instructions       General Comments      Pertinent Vitals/ Pain       Pain Assessment: No/denies pain  Home Living                                          Prior Functioning/Environment              Frequency  Min 2X/week        Progress Toward Goals  OT Goals(current goals can now be found in the care plan section)  Progress towards OT goals: Progressing toward goals (slowly. Waxes and wanes.)  Acute Rehab OT Goals Patient Stated Goal: To go home. OT Goal Formulation: Patient unable to participate in goal setting Time For Goal Achievement: 02/21/21 Potential to Achieve Goals: Fair ADL Goals Pt Will Perform Grooming: with supervision;standing Pt Will Perform Upper Body Dressing: sitting;with set-up Pt Will Perform Lower Body Dressing: with supervision;sit to/from stand Pt Will Transfer to Toilet: with supervision;ambulating Pt  Will Perform Toileting - Clothing Manipulation and hygiene: with supervision;sit to/from stand Pt/caregiver will Perform Home Exercise Program: Increased strength;Both right and left upper extremity;With theraband;With written HEP provided Additional ADL Goal #1: Patient will recall 3 fall risk reducing strategies in prep for safe d/c to next level of care.  Plan Frequency remains appropriate;Discharge plan remains appropriate    Co-evaluation                 AM-PAC OT "6 Clicks" Daily Activity     Outcome Measure   Help from another person eating meals?: None Help from another person taking care of personal grooming?: A Lot Help from another person toileting, which includes using toliet, bedpan, or urinal?: Total Help from another person bathing (including washing, rinsing, drying)?: Total Help from another person to put on and taking off regular upper body clothing?: A Lot Help from another person to put on and taking off regular lower body clothing?: Total 6 Click Score: 11    End of Session Equipment Utilized During Treatment: Gait belt;Rolling walker  OT Visit Diagnosis: Unsteadiness on feet (R26.81);Other abnormalities of gait and mobility (R26.89);Muscle weakness (generalized) (M62.81);Other symptoms and signs involving cognitive function;Other symptoms and signs involving the nervous system (R29.898)   Activity Tolerance Patient tolerated treatment well  Patient Left in chair;with call bell/phone within reach;with chair alarm set   Nurse Communication Mobility status (Stedy +2)        Time: 7078-6754 OT Time Calculation (min): 26 min  Charges: OT General Charges $OT Visit: 1 Visit OT Treatments $Self Care/Home Management : 23-37 mins  Lorina Duffner H. OTR/L Supplemental OT, Department of rehab services [phone]  Wood Novacek R H. 02/14/2021, 2:56 PM

## 2021-02-14 NOTE — Plan of Care (Signed)

## 2021-02-14 NOTE — Progress Notes (Signed)
2000 Blood sugar 64, pt refused dinner, Glucerna with sugar given at this time, will recheck in 30 minutes after consumption.   2030 Blood sugar recheck is 81, will cont to monitor.

## 2021-02-14 NOTE — Progress Notes (Addendum)
RE:  Carolyn Norton DOB:  1966/02/03 DATE:  02/14/2021  To Whom It May Concern,  Please be advised that the above-named patient will require a short-term nursing home stay - anticipated 30 days or less for rehabilitation and strengthening.  The plan is for return home.

## 2021-02-15 LAB — GLUCOSE, CAPILLARY
Glucose-Capillary: 112 mg/dL — ABNORMAL HIGH (ref 70–99)
Glucose-Capillary: 130 mg/dL — ABNORMAL HIGH (ref 70–99)
Glucose-Capillary: 180 mg/dL — ABNORMAL HIGH (ref 70–99)
Glucose-Capillary: 192 mg/dL — ABNORMAL HIGH (ref 70–99)
Glucose-Capillary: 287 mg/dL — ABNORMAL HIGH (ref 70–99)
Glucose-Capillary: 92 mg/dL (ref 70–99)

## 2021-02-15 NOTE — Plan of Care (Signed)

## 2021-02-15 NOTE — Progress Notes (Signed)
PROGRESS NOTE    Carolyn Norton  HCW:[id number] DOB: Apr 28, 1966 DOA: 01/11/2021 PCP: [name], FNP   Brief Narrative:  55 year old with history of DM2, bipolar 1, COVID, polysubstance abuse admitted for generalized weakness, multiple falls and confusion.  Initially diagnosed with dehydration, hyperglycemia and metabolic encephalopathy.  She was seen by psychiatry and deemed alert awake oriented and ability to make decision.  PT recommended SNF.Work-up showed NPH, neurosurgery recommended outpatient LP drain but if prolonged stay in the hospital they may consider inpatient.   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  Acute metabolic encephalopathy, multifactorial - Secondary to drug use, dehydration and poor nutrition.  This is resolved.  Also has underlying diagnosis of NPH.  NPH, ventriculomegaly - LP done 6/3-large-volume removed with some improvement in mentation and mobility.  Seen by neurosurgery and neurology.  Recommending LP drain placement.  Urinary tract infection - On IV Rocephin (6/12, last day).  Follow cultures  Diabetes mellitus type 2 - Continue current regimen.  Will adjust as needed  Hyperlipidemia - Statin  Bipolar disorder 1 - Continue Neurontin, escitalopram and Depakote.  Seen by psychiatry.  COPD - Bronchodilators  GERD - PPI  DVT prophylaxis: SCD's Start: 02/14/21 1761 Code Status: Full code Family Communication:    Status is: Inpatient  Remains inpatient appropriate because:Unsafe d/c plan  Dispo:  Patient From: Home  Planned Disposition: SNF  Medically stable for discharge: Difficult  placement       Subjective: No complaints.   RN in the room with me.   Review of Systems Otherwise negative except as per HPI, including: General: Denies fever, chills, night sweats or unintended weight loss. Resp: Denies cough, wheezing, shortness of breath. Cardiac: Denies chest pain, palpitations, orthopnea, paroxysmal nocturnal dyspnea. GI: Denies abdominal pain, nausea, vomiting, diarrhea or constipation GU: Denies dysuria, frequency, hesitancy or incontinence MS: Denies muscle aches, joint pain or swelling Neuro: Denies headache, neurologic deficits (focal weakness, numbness, tingling), abnormal gait Psych: Denies anxiety, depression, SI/HI/AVH Skin: Denies new rashes or lesions ID: Denies sick contacts, exotic exposures, travel  Examination:  General exam: Appears calm and comfortable  Respiratory system: Clear to auscultation. Respiratory effort normal. Cardiovascular system: S1 & S2 heard, RRR. No JVD, murmurs, rubs, gallops or clicks. No pedal edema. Gastrointestinal system: Abdomen is nondistended, soft and nontender. No organomegaly or masses felt. Normal bowel sounds heard. Central nervous system: Alert and oriented x 2. No focal neurological deficits. Extremities: Symmetric 5 x 5 power. Skin: No rashes, lesions or ulcers Psychiatry: Judgement and insight appear normal. Mood & affect appropriate.     Objective: Vitals:   02/14/21 0754 02/14/21 1211 02/14/21 1933 02/15/21 0537  BP:  (!) 117/57 115/71 (!) 154/80  Pulse:  82 84 74  Resp:  17 18 16   Temp:  98.2 F (36.8 C) 98.2 F (36.8 C) 97.7 F (36.5 C)  TempSrc:   Oral Oral  SpO2: 95% 96% 94% 99%  Weight:      Height:        Intake/Output Summary (Last 24 hours) at 02/15/2021 0806 Last data filed at 02/15/2021 0700 Gross per 24 hour  Intake 1324.41 ml  Output --  Net 1324.41 ml   Filed Weights   01/11/21 1152  Weight: 99.8 kg     Data Reviewed:   CBC: Recent Labs  Lab 02/14/21 0908   WBC 7.7  HGB 11.3*  HCT 34.8*  MCV 90.6  PLT 159   Basic Metabolic Panel: Recent Labs  Lab 02/12/21 0811 02/14/21 0908  NA 133* 139  K 5.2* 4.5  CL 96* 103  CO2 29 27  GLUCOSE 607* 137*  BUN 16 13  CREATININE 1.02* 0.98  CALCIUM 8.8* 9.1   GFR: Estimated Creatinine Clearance: 79.1 mL/min (by C-G formula based on SCr of 0.98 mg/dL). Liver Function Tests: No results for input(s): AST, ALT, ALKPHOS, BILITOT, PROT, ALBUMIN in the last 168 hours. No results for input(s): LIPASE, AMYLASE in the last 168 hours. No results for input(s): AMMONIA in the last 168 hours. Coagulation Profile: Recent Labs  Lab 02/14/21 0908 02/14/21 1244  INR SPECIMEN CLOTTED 1.0   Cardiac Enzymes: No results for input(s): CKTOTAL, CKMB, CKMBINDEX, TROPONINI in the last 168 hours. BNP (last 3 results) No results for input(s): PROBNP in the last 8760 hours. HbA1C: No results for input(s): HGBA1C in the last 72 hours. CBG: Recent Labs  Lab 02/14/21 1954 02/14/21 2030 02/14/21 2340 02/15/21 0359 02/15/21 0753  GLUCAP 64* 81 203* 287* 192*   Lipid Profile: No results for input(s): CHOL, HDL, LDLCALC, TRIG, CHOLHDL, LDLDIRECT in the last 72 hours. Thyroid Function Tests: No results for input(s): TSH, T4TOTAL, FREET4, T3FREE, THYROIDAB in the last 72 hours. Anemia Panel: No results for input(s): VITAMINB12, FOLATE, FERRITIN, TIBC, IRON, RETICCTPCT in the last 72 hours. Sepsis Labs: No results for input(s): PROCALCITON, LATICACIDVEN in the last 168 hours.  Recent Results (from the past 240 hour(s))  CSF culture w Gram Stain     Status: None   Collection Time: 02/05/21  2:25 PM   Specimen: PATH Cytology CSF; Cerebrospinal Fluid  Result Value Ref Range Status   Specimen Description CSF  Final   Special Requests NONE  Final   Gram Stain NO WBC SEEN NO ORGANISMS SEEN   Final   Culture   Final    NO GROWTH 3 DAYS Performed at [facility], [address], [place], [place]  [address]    Report Status 02/09/2021 FINAL  Final  CSF culture w Gram Stain     Status: None   Collection Time: 02/06/21 11:08 AM   Specimen: PATH Cytology CSF; Cerebrospinal Fluid  Result Value Ref Range Status   Specimen Description CSF  Final   Special Requests NONE  Final   Gram Stain   Final    WBC PRESENT,BOTH PMN AND MONONUCLEAR NO ORGANISMS SEEN CYTOSPIN SMEAR    Culture   Final    NO GROWTH 3 DAYS Performed at [facility], [address], [place], [place] [address]    Report Status 02/09/2021 FINAL  Final  Culture, blood (routine x 2)     Status: None (Preliminary result)   Collection Time: 02/13/21  8:55 AM   Specimen: BLOOD  Result Value Ref Range Status   Specimen Description BLOOD RIGHT ANTECUBITAL  Final   Special Requests   Final    BOTTLES DRAWN AEROBIC AND ANAEROBIC Blood Culture results may not be optimal due to an excessive volume of blood received in culture bottles   Culture   Final    NO GROWTH < 24 HOURS Performed at [facility], [address], [place], [place]  [address]    Report Status PENDING  Incomplete  Culture, blood (routine x 2)     Status: None (Preliminary result)   Collection Time: 02/13/21  9:03 AM   Specimen: BLOOD  Result Value Ref Range Status   Specimen Description BLOOD RIGHT ANTECUBITAL  Final   Special Requests   Final    BOTTLES DRAWN AEROBIC AND ANAEROBIC Blood Culture adequate volume   Culture   Final    NO GROWTH < 24 HOURS Performed at [facility], [address] [place], [place] [address]    Report Status PENDING  Incomplete         Radiology Studies: DG Chest 2 View  Result Date: 02/13/2021 CLINICAL DATA:  Shortness of breath EXAM: CHEST - 2 VIEW COMPARISON:  Jan 11, 2021 FINDINGS: The lungs are clear. The heart size and pulmonary vascularity are normal. No adenopathy. There is degenerative change in the thoracic spine. IMPRESSION: Lungs clear.  Cardiac silhouette normal. Electronically Signed   By:  Bretta Bang III M.D.   On: 02/13/2021 11:34        Scheduled Meds:  atorvastatin  20 mg Oral Daily   cholecalciferol  1,000 Units Oral Daily   divalproex  500 mg Oral BID   escitalopram  20 mg Oral Daily   feeding supplement (GLUCERNA SHAKE)  237 mL Oral TID AC & HS   gabapentin  300 mg Oral BID   insulin aspart  0-9 Units Subcutaneous Q4H   insulin aspart protamine- aspart  4 Units Subcutaneous Q supper   insulin aspart protamine- aspart  6 Units Subcutaneous Q breakfast   metFORMIN  1,000 mg Oral BID WC   montelukast  10 mg Oral QHS   pantoprazole  40 mg Oral Daily   senna-docusate  2 tablet Oral Daily   thiamine  100 mg Oral Daily   Continuous Infusions:  sodium chloride 75 mL/hr at 02/15/21 0700   cefTRIAXone (ROCEPHIN)  IV Stopped (02/14/21 2306)   vancomycin       LOS: 34 days   Time spent= 35 mins    Annette Bertelson Joline Maxcy, MD [facility]  If 7PM-7AM, please contact night-coverage  02/15/2021, 8:06 AM

## 2021-02-15 NOTE — Plan of Care (Signed)
Neurology Plan of Care  Agree with NSU plan to workup possible NPH with lumbar drain trial. This may be done in the inpatient or outpatient setting at their discretion (NPH is not an acute issue and treatment is not emergent). Neurology will not continue to actively follow. Defer to NSU for further w/u. Please re-engage neurology if new neurologic concerns arise.   Bing Neighbors, MD [facility] [phone]  If 7pm- 7am, please page neurology on call as listed in [facility]ION.

## 2021-02-16 LAB — GLUCOSE, CAPILLARY
Glucose-Capillary: 209 mg/dL — ABNORMAL HIGH (ref 70–99)
Glucose-Capillary: 157 mg/dL — ABNORMAL HIGH (ref 70–99)
Glucose-Capillary: 127 mg/dL — ABNORMAL HIGH (ref 70–99)
Glucose-Capillary: 85 mg/dL (ref 70–99)
Glucose-Capillary: 124 mg/dL — ABNORMAL HIGH (ref 70–99)
Glucose-Capillary: 249 mg/dL — ABNORMAL HIGH (ref 70–99)

## 2021-02-16 NOTE — Progress Notes (Signed)
PROGRESS NOTE    Carolyn Norton  PYP:[id number] DOB: 08-28-1966 DOA: 01/11/2021 PCP: [name], FNP   Brief Narrative:  55 year old with history of DM2, bipolar 1, COVID, polysubstance abuse admitted for generalized weakness, multiple falls and confusion.  Initially diagnosed with dehydration, hyperglycemia and metabolic encephalopathy.  She was seen by psychiatry and deemed alert awake oriented and ability to make decision.  PT recommended SNF.Work-up showed NPH, neurosurgery recommended outpatient LP drain but if prolonged stay in the hospital they may consider inpatient.   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  Acute metabolic encephalopathy, multifactorial,  Improved. - Secondary to drug use, dehydration and poor nutrition.  This is resolved.  Also has underlying diagnosis of NPH.  NPH, ventriculomegaly - LP done 6/3-large-volume removed with some improvement in mentation and mobility.  Seen by neurosurgery and neurology. LP drain recommended- inpatient vs outpatient depending her dc disposition.   Urinary tract infection - On IV Rocephin (6/12, last day).  Follow cultures  Diabetes mellitus type 2 - Continue current regimen.  Will adjust as needed  Hyperlipidemia - Statin  Bipolar disorder 1 - Continue Neurontin, escitalopram and Depakote.  Seen by psychiatry.  COPD - Bronchodilators  GERD - PPI  DVT prophylaxis: SCD's Start: 02/14/21 2458 Code Status: Full code Family Communication:    Status is: Inpatient  Remains inpatient appropriate because:Unsafe d/c plan  Dispo:  Patient From: Home  Planned Disposition:  SNF  Medically stable for discharge: Difficult placement       Subjective: Feeling okay no complaints.  No acute events overnight.  Review of Systems Otherwise negative except as per HPI, including: General: Denies fever, chills, night sweats or unintended weight loss. Resp: Denies cough, wheezing, shortness of breath. Cardiac: Denies chest pain, palpitations, orthopnea, paroxysmal nocturnal dyspnea. GI: Denies abdominal pain, nausea, vomiting, diarrhea or constipation GU: Denies dysuria, frequency, hesitancy or incontinence MS: Denies muscle aches, joint pain or swelling Neuro: Denies headache, neurologic deficits (focal weakness, numbness, tingling), abnormal gait Psych: Denies anxiety, depression, SI/HI/AVH Skin: Denies new rashes or lesions ID: Denies sick contacts, exotic exposures, travel  Examination:  General exam: Appears calm and comfortable  Respiratory system: Clear to auscultation. Respiratory effort normal. Cardiovascular system: S1 & S2 heard, RRR. No JVD, murmurs, rubs, gallops or clicks. No pedal edema. Gastrointestinal system: Abdomen is nondistended, soft and nontender. No organomegaly or masses felt. Normal bowel sounds heard. Central nervous system: Alert and oriented x 2. No focal neurological deficits. Extremities: Symmetric 5 x 5 power. Skin: No rashes, lesions or ulcers Psychiatry: Judgement and insight appear normal. Mood & affect appropriate.     Objective: Vitals:   02/15/21 0537 02/15/21 1555 02/15/21 2056 02/16/21 0422  BP: (!) 154/80 121/70 (!) 121/92 (!) 156/79  Pulse: 74 75 79 69  Resp: 16 16 16 20   Temp: 97.7 F (36.5 C) 97.8 F (36.6 C) 98 F (36.7 C) 98.2 F (36.8 C)  TempSrc: Oral     SpO2: 99% 94% 96% 96%  Weight:      Height:        Intake/Output Summary (Last 24 hours) at 02/16/2021 0843 Last data filed at 02/15/2021 1700 Gross  per 24 hour  Intake 1098.95 ml  Output --  Net 1098.95 ml   Filed Weights   01/11/21 1152   Weight: 99.8 kg     Data Reviewed:   CBC: Recent Labs  Lab 02/14/21 0908  WBC 7.7  HGB 11.3*  HCT 34.8*  MCV 90.6  PLT 159   Basic Metabolic Panel: Recent Labs  Lab 02/12/21 0811 02/14/21 0908  NA 133* 139  K 5.2* 4.5  CL 96* 103  CO2 29 27  GLUCOSE 607* 137*  BUN 16 13  CREATININE 1.02* 0.98  CALCIUM 8.8* 9.1   GFR: Estimated Creatinine Clearance: 79.1 mL/min (by C-G formula based on SCr of 0.98 mg/dL). Liver Function Tests: No results for input(s): AST, ALT, ALKPHOS, BILITOT, PROT, ALBUMIN in the last 168 hours. No results for input(s): LIPASE, AMYLASE in the last 168 hours. No results for input(s): AMMONIA in the last 168 hours. Coagulation Profile: Recent Labs  Lab 02/14/21 0908 02/14/21 1244  INR SPECIMEN CLOTTED 1.0   Cardiac Enzymes: No results for input(s): CKTOTAL, CKMB, CKMBINDEX, TROPONINI in the last 168 hours. BNP (last 3 results) No results for input(s): PROBNP in the last 8760 hours. HbA1C: No results for input(s): HGBA1C in the last 72 hours. CBG: Recent Labs  Lab 02/15/21 1555 02/15/21 2053 02/15/21 2331 02/16/21 0420 02/16/21 0755  GLUCAP 180* 92 130* 209* 157*   Lipid Profile: No results for input(s): CHOL, HDL, LDLCALC, TRIG, CHOLHDL, LDLDIRECT in the last 72 hours. Thyroid Function Tests: No results for input(s): TSH, T4TOTAL, FREET4, T3FREE, THYROIDAB in the last 72 hours. Anemia Panel: No results for input(s): VITAMINB12, FOLATE, FERRITIN, TIBC, IRON, RETICCTPCT in the last 72 hours. Sepsis Labs: No results for input(s): PROCALCITON, LATICACIDVEN in the last 168 hours.  Recent Results (from the past 240 hour(s))  CSF culture w Gram Stain     Status: None   Collection Time: 02/06/21 11:08 AM   Specimen: PATH Cytology CSF; Cerebrospinal Fluid  Result Value Ref Range Status   Specimen Description CSF  Final   Special Requests NONE  Final   Gram Stain   Final    WBC PRESENT,BOTH PMN AND MONONUCLEAR NO ORGANISMS  SEEN CYTOSPIN SMEAR    Culture   Final    NO GROWTH 3 DAYS Performed at [facility], [address] [place], [place] [address]    Report Status 02/09/2021 FINAL  Final  Culture, blood (routine x 2)     Status: None (Preliminary result)   Collection Time: 02/13/21  8:55 AM   Specimen: BLOOD  Result Value Ref Range Status   Specimen Description BLOOD RIGHT ANTECUBITAL  Final   Special Requests   Final    BOTTLES DRAWN AEROBIC AND ANAEROBIC Blood Culture results may not be optimal due to an excessive volume of blood received in culture bottles   Culture   Final    NO GROWTH 3 DAYS Performed at [facility], [address], [place], [place] [address]    Report Status PENDING  Incomplete  Culture, blood (routine x 2)     Status: None (Preliminary result)   Collection Time: 02/13/21  9:03 AM   Specimen: BLOOD  Result Value Ref Range Status   Specimen Description BLOOD RIGHT ANTECUBITAL  Final   Special Requests   Final    BOTTLES DRAWN AEROBIC AND ANAEROBIC Blood Culture adequate volume   Culture   Final    NO GROWTH 3 DAYS Performed at [facility], [address] [place], [place] [address]    Report Status PENDING  Incomplete         Radiology Studies: No results found.      Scheduled Meds:  atorvastatin  20 mg Oral Daily   cholecalciferol  1,000 Units Oral Daily   divalproex  500 mg Oral BID   escitalopram  20 mg Oral Daily   feeding supplement (GLUCERNA SHAKE)  237 mL Oral TID AC & HS   gabapentin  300 mg Oral BID   insulin aspart  0-9 Units Subcutaneous Q4H   insulin aspart protamine- aspart  4 Units Subcutaneous Q supper   insulin aspart protamine- aspart  6 Units Subcutaneous Q breakfast   metFORMIN  1,000 mg Oral BID WC   montelukast  10 mg Oral QHS   pantoprazole  40 mg Oral Daily   senna-docusate  2 tablet Oral Daily   thiamine  100 mg Oral Daily   Continuous Infusions:  sodium chloride 75 mL/hr at 02/15/21 1923   cefTRIAXone  (ROCEPHIN)  IV 1 g (02/15/21 2214)   vancomycin       LOS: 35 days   Time spent= 35 mins    Mikaeel Petrow Joline Maxcy, MD [facility]  If 7PM-7AM, please contact night-coverage  02/16/2021, 8:43 AM

## 2021-02-17 LAB — GLUCOSE, CAPILLARY
Glucose-Capillary: >600 mg/dL (ref 70–99)
Glucose-Capillary: 237 mg/dL — ABNORMAL HIGH (ref 70–99)
Glucose-Capillary: 164 mg/dL — ABNORMAL HIGH (ref 70–99)
Glucose-Capillary: 157 mg/dL — ABNORMAL HIGH (ref 70–99)
Glucose-Capillary: 164 mg/dL — ABNORMAL HIGH (ref 70–99)
Glucose-Capillary: 81 mg/dL (ref 70–99)
Glucose-Capillary: 248 mg/dL — ABNORMAL HIGH (ref 70–99)

## 2021-02-17 NOTE — Progress Notes (Addendum)
[facility] PROGRESS NOTE  Carolyn Norton TZG:[id number] DOB: Jan 30, 1966 DOA: 01/11/2021 PCP: [name], FNP  Status: Remains inpatient appropriate because:Unsafe d/c plan-due to patient's underlying poor mental status short-term memory deficits she will require 24/7 care after discharge  Dispo:  Patient From:  Home  Planned Disposition: SNF-facility in [place] is reviewing patient's chart  Medically stable for discharge:  NO  Barriers to DC: Continues to require significant assistance with mobility.  Continues to have significant short-term memory deficits impeding patient's ability to safely return home with elderly parents             Difficult to place: Yes  Level of care: Med-Surg  Code Status: DNR Family Communication: 6/10 Mother [name] updated DVT prophylaxis: Lovenox COVID vaccination status: Unknown   HPI: 55 year old female past medical history of diabetes mellitus type 2, bipolar disorder type I, previous COVID infection, polysubstance abuse that includes cocaine.  She presented from home where she lives with her parents.  Her presenting symptoms included generalized weakness, multiple falls and confusion.  Family confirmed that she had been having the symptoms for at least a month with last use of crack cocaine 1 month prior to presentation.  Ever since that time she had not been doing well.  She would also be having issues with hyperglycemia.  In the ER she was diagnosed with severe dehydration, hyperglycemia and metabolic encephalopathy.  Since admission patient has been evaluated by the psychiatric team who has deemed her alert and oriented and having capacity to make decisions.  Patient is agreeable to placement at a rehab.  Currently she has severe physical deconditioning which was POA and PT is recommending SNF.  Because of her history of polysubstance abuse and lack of funding SNF is not an option.  Family is agreeable to taking her back in home when  she is able to ambulate well enough to get back and forth for meals into the bathroom.  Subjective: Patient awakened from sleep.  Had not eaten anything off of her trainer.  She had only taken a few sips of her protein beverage.  Ported that she was not hungry.  Objective: Vitals:   02/16/21 2016 02/17/21 0413  BP: (!) 152/59 (!) 150/95  Pulse: 72 75  Resp: 18 18  Temp: 97.9 F (36.6 C) 97.9 F (36.6 C)  SpO2: 96% 98%    Intake/Output Summary (Last 24 hours) at 02/17/2021 0755 Last data filed at 02/16/2021 1809 Gross per 24 hour  Intake --  Output 1000 ml  Net -1000 ml   Filed Weights   01/11/21 1152  Weight: 99.8 kg    Exam:  Constitutional: Awakened, was able to stay awake during conversation, no acute distress Respiratory: Lungs are clear, no increased work of breathing, room air, pulse oximetry 98%. Cardiac: Normal heart sounds, no peripheral edema, regular pulse, normotensive Abdomen: LBM 6/10, abdomen soft nontender nondistended.  Continues with poor oral intake therefore IV fluids remain in place at 75 cc/h Neurologic: CN 2-12 grossly intact. Sensation intact, Strength 4/5 x all 4 extremities.   Psychiatric: Awakens easily.  Today she is oriented to self, year, generalized time of day and president.  She was not oriented to season or month.    Assessment/Plan: Acute problems: Acute metabolic encephalopathy (multifactorial): Severe dehydration in context of poorly controlled diabetes mellitus with hyperglycemia Initially felt to be secondary to cocaine withdrawal and poor nutrition POA  Acute encephalopathy resolved but persistent cognitive and gait issues that appear to be consistent  with possible NPH seen on imaging (see below)  -Notable improvements after large-volume LP last week but has had progressive worsening in symptoms since that time  Ventriculomegaly/? NPH Significantly abnormal cognitive screening tests (short Blessed test and SLUMS)  Appreciate  assistance of neurology and neurosurgery. On 6/3 after large-volume LP patient did gain some improvement in mobility and mentation. Neurosurgery had planned LP drain trial 6/10 but has placed on hold given acute UTI.  Current plan is to complete this work-up in the outpatient setting since it is likely patient will get a bed offer and discharged to an SNF in the [facility] area Patient will need to follow-up with neurosurgery after discharge. **Neurosurgical team/neurosurgeon has documented that NPH is not emergent and does not need a LD trial before placement to SNF.  She can be followed up in the outpatient setting after discharge if a bed is available before can arrange for an LD trial**  Anorexia/constipation Poor oral intake/poor appetite has reemerged over the past several days and now she is requiring IV fluids to maintain adequate hydration  Abnormal urinalysis/UTI Patient having significant bouts of hyperglycemia not explained by dietary intake especially given poor oral intake Urinalysis markedly abnormal and concerning for UTI Has completed 3 days of IV Rocephin with significant improvement in hyperglycemia Blood cultures have been negative Although it appeared a urine culture has been collected and sent to the lab this was not the case therefore unable to appropriately identify offending organism  Diabetes mellitus 2 with hyperglycemia on insulin Hemoglobin A1c 11.0 Significant hyperglycemia resolved after treatment of underlying UTI Continue 70/30 to 6 units in p.m. 70/30 to 4 units with supper Continue IV fluids at 75 cc/h Meal coverage discontinued since intake remains poor/variable Continue SSI  Dyslipidemia Continue statin  History of bipolar 1 disorder/polysubstance abuse/cognitive impairment Continue escitalopram, Neurontin and Depakote Cleared by psych regarding orientation and capacity Functional cognitive evaluation per SLP and Occupational Therapy with significant  abnormalities Will need to have repeat cognitive evaluations after VP shunt placed to ensure no residual abnormalities  Physical deconditioning/bilateral heel cord shortening -He recommends bilateral PRAFO boots on every 4 hours and off every 4 hour -Continue regular PT sessions -SNF at discharge   Other problems: Acute kidney injury on CKD 3a Acute kidney injury resolved after IV fluids Baseline creatinine 1.3  COPD Continue Singulair, nebs as needed, consider adding LABA  GERD Continue PPI  Hypokalemia/hypomagnesemia Resolved  Data Reviewed: Basic Metabolic Panel: Recent Labs  Lab 02/12/21 0811 02/14/21 0908  NA 133* 139  K 5.2* 4.5  CL 96* 103  CO2 29 27  GLUCOSE 607* 137*  BUN 16 13  CREATININE 1.02* 0.98  CALCIUM 8.8* 9.1   Liver Function Tests: No results for input(s): AST, ALT, ALKPHOS, BILITOT, PROT, ALBUMIN in the last 168 hours. No results for input(s): LIPASE, AMYLASE in the last 168 hours. No results for input(s): AMMONIA in the last 168 hours. CBC: Recent Labs  Lab 02/14/21 0908  WBC 7.7  HGB 11.3*  HCT 34.8*  MCV 90.6  PLT 159   Cardiac Enzymes: No results for input(s): CKTOTAL, CKMB, CKMBINDEX, TROPONINI in the last 168 hours. BNP (last 3 results) Recent Labs    01/14/21 0238 01/15/21 0230 01/16/21 0201  BNP 177.8* 115.9* 40.9    ProBNP (last 3 results) No results for input(s): PROBNP in the last 8760 hours.  CBG: Recent Labs  Lab 02/16/21 1138 02/16/21 1628 02/16/21 2014 02/16/21 2325 02/17/21 0409  GLUCAP 127* 85 124*  249* 237*    Recent Results (from the past 240 hour(s))  Culture, blood (routine x 2)     Status: None (Preliminary result)   Collection Time: 02/13/21  8:55 AM   Specimen: BLOOD  Result Value Ref Range Status   Specimen Description BLOOD RIGHT ANTECUBITAL  Final   Special Requests   Final    BOTTLES DRAWN AEROBIC AND ANAEROBIC Blood Culture results may not be optimal due to an excessive volume of blood  received in culture bottles   Culture   Final    NO GROWTH 4 DAYS Performed at [facility], [address] [place], [place] [address]    Report Status PENDING  Incomplete  Culture, blood (routine x 2)     Status: None (Preliminary result)   Collection Time: 02/13/21  9:03 AM   Specimen: BLOOD  Result Value Ref Range Status   Specimen Description BLOOD RIGHT ANTECUBITAL  Final   Special Requests   Final    BOTTLES DRAWN AEROBIC AND ANAEROBIC Blood Culture adequate volume   Culture   Final    NO GROWTH 4 DAYS Performed at [facility], [address], [place], [place] [address]    Report Status PENDING  Incomplete     Studies: No results found.  Scheduled Meds:  atorvastatin  20 mg Oral Daily   cholecalciferol  1,000 Units Oral Daily   divalproex  500 mg Oral BID   escitalopram  20 mg Oral Daily   feeding supplement (GLUCERNA SHAKE)  237 mL Oral TID AC & HS   gabapentin  300 mg Oral BID   insulin aspart  0-9 Units Subcutaneous Q4H   insulin aspart protamine- aspart  4 Units Subcutaneous Q supper   insulin aspart protamine- aspart  6 Units Subcutaneous Q breakfast   metFORMIN  1,000 mg Oral BID WC   montelukast  10 mg Oral QHS   pantoprazole  40 mg Oral Daily   senna-docusate  2 tablet Oral Daily   thiamine  100 mg Oral Daily   Continuous Infusions:  sodium chloride 75 mL/hr at 02/15/21 1923   vancomycin      Principal Problem:   Bipolar 1 disorder, depressed, full remission (HCC) Active Problems:   Bipolar 1 disorder (HCC)   Hypertension   Generalized weakness   Controlled type 2 diabetes mellitus with hyperglycemia, with long-term current use of insulin (HCC)   Hyperlipidemia   NPH (normal pressure hydrocephalus) (HCC)   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   Consultants: Psychiatry  Procedures: None  Antibiotics: Anti-infectives (From admission, onward)    Start     Dose/Rate Route Frequency Ordered Stop   02/14/21 0828  vancomycin  (VANCOREADY) IVPB 1000 mg/200 mL        1,000 mg 200 mL/hr over 60 Minutes Intravenous 60 min pre-op 02/14/21 0828     02/13/21 2115  cefTRIAXone (ROCEPHIN) 1 g in sodium chloride 0.9 % 100 mL IVPB  Status:  Discontinued        1 g 200 mL/hr over 30 Minutes Intravenous Every 24 hours 02/13/21 2016 02/16/21 0844        Time spent: 25 minutes    [name] ANP  [facility] 7 am - 330 pm/M-F for direct patient care and secure chat Please refer to Amion for contact info 36  days

## 2021-02-17 NOTE — Progress Notes (Signed)
Orthopedic Tech Progress Note Patient Details:  Carolyn Norton Apr 09, 1966 [id number]  Ortho Devices Type of Ortho Device: Prafo boot/shoe Ortho Device/Splint Location: BLE Ortho Device/Splint Interventions: Ordered, Application   Post Interventions Patient Tolerated: Well Instructions Provided: Care of device  Donald Pore 02/17/2021, 2:14 PM

## 2021-02-17 NOTE — TOC Progression Note (Signed)
Transition of Care [facility]) - Progression Note    Patient Details  Name: Tienna Bienkowski MRN: [id number] Date of Birth: 07-31-1966  Transition of [facility]) CM/SW Contact  Janae Bridgeman, RN Phone Number: 02/17/2021, 10:47 AM  Clinical Narrative:    Case management called and left a message with admissions at [facility] in [place], [place].  Loyce Dys, Network engineer is out of the office for the day.  Arian, CM was considering a bed offer for the patient for admission, if patient was able to admit to the facility under an LOG offered by [facility].    CM will continue to follow the patient for SNF placement and needed PASSR level 2 to complete FL2 documentation.  Expected Discharge Plan: Skilled Nursing Facility Barriers to Discharge: Family Issues, Continued Medical Work up  Expected Discharge Plan and Services Expected Discharge Plan: Skilled Nursing Facility In-house Referral: Clinical Social Work Discharge Planning Services: CM Consult Post Acute Care Choice: Skilled Nursing Facility Living arrangements for the past 2 months: Single Family Home Expected Discharge Date: 01/16/21               DME Arranged: [name] rolling DME Agency: [employer] Date DME Agency Contacted: 01/20/21 Time DME Agency Contacted: 1435 Representative spoke with at DME Agency: [name]             Social Determinants of Health (SDOH) Interventions    Readmission Risk Interventions Readmission Risk Prevention Plan 01/14/2021  Transportation Screening Complete  PCP or Specialist Appt within 3-5 Days (No Data)  Social Work Consult for Recovery Care Planning/Counseling Complete  Palliative Care Screening Not Applicable  Medication Review Oceanographer) Referral to Pharmacy  Some recent data might be hidden

## 2021-02-17 NOTE — Plan of Care (Signed)

## 2021-02-17 NOTE — Progress Notes (Addendum)
Physical Therapy Treatment Patient Details Name: Carolyn Norton MRN: [id number] DOB: 10/30/1965 Today's Date: 02/17/2021    History of Present Illness Pt is a 55 y.o. female admitted 01/11/21 with multiple falls, AMS, weakness. Workup for severe dehydration, DKA, metabolic encephalopathy. 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 Neurosurgery following for possible lumbar drain trial vs repeat LP vs VP shunt placement. Placement of lumbar drain on 6/10 cancelled due to UTI. PMH includes DM2, COPD, CKD3, GERD, bipolar disorder, COVID-19, polysubstance abuse including cocaine.    PT Comments    Patient much more alert and conversant than previous sessions. Following commands more briskly (still with delay) and able to stand with use of stedy and 2 person assist due to strong posterior lean. Noted bil heel cord tightness when knees extended and performed PROM and stretching. Pt could benefit from bil PRAFOs for heel cord stretching. Will notify MD.     Follow Up Recommendations  SNF (family wants to take pt home but cannot at current functional level)     Equipment Recommendations  Rolling walker with 5" wheels;3in1 (PT);Hospital bed;Wheelchair (measurements PT);Other (comment) (hoyer lift; *reassess after lumbar drain)    Recommendations for Other Services OT consult     Precautions / Restrictions Precautions Precautions: Fall Restrictions Weight Bearing Restrictions: No    Mobility  Bed Mobility Overal bed mobility: Needs Assistance Bed Mobility: Supine to Sit Rolling: Mod assist         General bed mobility comments: Patient waxes and wanes.  Continues to require max multimodal cues for sequencing and attention to task.    Transfers Overall transfer level: Needs assistance   Transfers: Sit to/from Stand Sit to Stand: Min assist;Max assist         General transfer comment: Patient can require Min to Max A to stand with use of stedy (incr  assist as she fatigues with visible shaking of legs). Continues to require max multimodal cues for hand placement, sequencing and attention to task.  Ambulation/Gait             General Gait Details: Unable to amb due to posteior lean and inability to initiate steps   Stairs             Wheelchair Mobility    Modified Rankin (Stroke Patients Only)       Balance Overall balance assessment: Needs assistance;History of Falls Sitting-balance support: Feet supported;Bilateral upper extremity supported Sitting balance-Leahy Scale: Poor Sitting balance - Comments: Static sitting at EOB with Mod A at most and Min A at best. Able to maintain static sitting balance with close supervision A with bilateral hands on rail of Stedy. Requires max multimodal cues for sequencing/hand placement. Waxes and wanes with sitting balance. Continued posterior lean. Postural control: Posterior lean Standing balance support: During functional activity;Bilateral upper extremity supported Standing balance-Leahy Scale: Poor Standing balance comment: standing in stedy working on anterior wt-xhift and reaching forward with each UE; continues with posterior lean                            Cognition Arousal/Alertness: Awake/alert Behavior During Therapy: WFL for tasks assessed/performed Overall Cognitive Status: No family/caregiver present to determine baseline cognitive functioning Area of Impairment: Orientation;Attention;Memory;Following commands;Safety/judgement;Awareness;Problem solving                 Orientation Level: Disoriented to;Place;Time;Situation Current Attention Level: Sustained Memory: Decreased short-term memory Following Commands: Follows one step commands inconsistently;Follows  one step commands with increased time Safety/Judgement: Decreased awareness of safety;Decreased awareness of deficits Awareness: Intellectual Problem Solving: Slow processing;Difficulty  sequencing;Requires verbal cues;Decreased initiation;Requires tactile cues General Comments: Patient with better attention to tasks this date. attempts to follow commands to correct her standing posture, however is unsuccessful (at least partly due to tight heel cords)      Exercises General Exercises - Lower Extremity Ankle Circles/Pumps: AROM;Both;10 reps;Seated Long Arc Quad: AROM;Both;10 reps;Seated Hip Flexion/Marching: AROM;Both;10 reps;Seated Other Exercises Other Exercises: 30 sec stretch and mobs to each ankle x 2 to increase dorsiflexion; will request PRAFOs from MD    General Comments        Pertinent Vitals/Pain Pain Assessment: No/denies pain Faces Pain Scale: No hurt    Home Living                      Prior Function            PT Goals (current goals can now be found in the care plan section) Acute Rehab PT Goals Patient Stated Goal: To go home. PT Goal Formulation: Patient unable to participate in goal setting Time For Goal Achievement: 02/25/21 Potential to Achieve Goals: Fair Progress towards PT goals: Progressing toward goals    Frequency    Min 2X/week      PT Plan Discharge plan needs to be updated;Frequency needs to be updated    Co-evaluation              AM-PAC PT "6 Clicks" Mobility   Outcome Measure  Help needed turning from your back to your side while in a flat bed without using bedrails?: Total Help needed moving from lying on your back to sitting on the side of a flat bed without using bedrails?: Total Help needed moving to and from a bed to a chair (including a wheelchair)?: Total Help needed standing up from a chair using your arms (e.g., wheelchair or bedside chair)?: Total Help needed to walk in hospital room?: Total Help needed climbing 3-5 steps with a railing? : Total 6 Click Score: 6    End of Session Equipment Utilized During Treatment: Gait belt Activity Tolerance: Treatment limited secondary to medical  complications (Comment) (decr cognition) Patient left: with call bell/phone within reach;in chair;with chair alarm set Nurse Communication: Mobility status;Need for lift equipment (likely use maximove for chair to bed) PT Visit Diagnosis: Muscle weakness (generalized) (M62.81);Repeated falls (R29.6);Difficulty in walking, not elsewhere classified (R26.2);Unsteadiness on feet (R26.81)     Time: 1610-9604 PT Time Calculation (min) (ACUTE ONLY): 25 min  Charges:  $Therapeutic Exercise: 8-22 mins $Therapeutic Activity: 8-22 mins                      Jerolyn Center, PT Pager [phone]    Zena Amos 02/17/2021, 11:44 AM

## 2021-02-18 LAB — GLUCOSE, CAPILLARY
Glucose-Capillary: 211 mg/dL — ABNORMAL HIGH (ref 70–99)
Glucose-Capillary: 123 mg/dL — ABNORMAL HIGH (ref 70–99)
Glucose-Capillary: 171 mg/dL — ABNORMAL HIGH (ref 70–99)
Glucose-Capillary: 108 mg/dL — ABNORMAL HIGH (ref 70–99)
Glucose-Capillary: 210 mg/dL — ABNORMAL HIGH (ref 70–99)
Glucose-Capillary: 154 mg/dL — ABNORMAL HIGH (ref 70–99)

## 2021-02-18 LAB — CULTURE, BLOOD (ROUTINE X 2)
Culture: NO GROWTH
Culture: NO GROWTH
Special Requests: ADEQUATE

## 2021-02-18 NOTE — Plan of Care (Signed)

## 2021-02-18 NOTE — Progress Notes (Addendum)
[facility] PROGRESS NOTE  Antonina Deziel ONG:[id number] DOB: 1965-12-31 DOA: 01/11/2021 PCP: [name], FNP  Status: Remains inpatient appropriate because:Unsafe d/c plan-due to patient's underlying poor mental status short-term memory deficits she will require 24/7 care after discharge  Dispo:  Patient From:  Home  Planned Disposition: SNF-facility in [place] is reviewing patient's chart  Medically stable for discharge:  NO  Barriers to DC: Continues to require significant assistance with mobility.  Continues to have significant short-term memory deficits impeding patient's ability to safely return home with elderly parents             Difficult to place: Yes  Level of care: Med-Surg  Code Status: DNR Family Communication: 6/10 Mother [name] updated DVT prophylaxis: Lovenox COVID vaccination status: Unknown   HPI: 55 year old female past medical history of diabetes mellitus type 2, bipolar disorder type I, previous COVID infection, polysubstance abuse that includes cocaine.  She presented from home where she lives with her parents.  Her presenting symptoms included generalized weakness, multiple falls and confusion.  Family confirmed that she had been having the symptoms for at least a month with last use of crack cocaine 1 month prior to presentation.  Ever since that time she had not been doing well.  She would also be having issues with hyperglycemia.  In the ER she was diagnosed with severe dehydration, hyperglycemia and metabolic encephalopathy.  Since admission patient has been evaluated by the psychiatric team who has deemed her alert and oriented and having capacity to make decisions.  Patient is agreeable to placement at a rehab.  Currently she has severe physical deconditioning which was POA and PT is recommending SNF.  Because of her history of polysubstance abuse and lack of funding SNF is not an option.  Family is agreeable to taking her back in home when  she is able to ambulate well enough to get back and forth for meals into the bathroom.  Subjective: Awake and sitting up in bed.  Still not eating much.  Patient had pills in her mouth.  Nurse instructed her to take the pills.  She became distracted by my entry into the room the nurse had repeat to her to swallow the pills.  Objective: Vitals:   02/17/21 2009 02/18/21 0505  BP: 134/76 (!) 163/85  Pulse: 72 82  Resp: 18 18  Temp: 98.2 F (36.8 C) 97.8 F (36.6 C)  SpO2: 99% 99%    Intake/Output Summary (Last 24 hours) at 02/18/2021 0723 Last data filed at 02/18/2021 0318 Gross per 24 hour  Intake 1302.34 ml  Output 1200 ml  Net 102.34 ml   Filed Weights   01/11/21 1152  Weight: 99.8 kg    Exam:  Constitutional: Alert and calm without any acute distress Respiratory: Lung sounds are clear, she remained stable on room air, no coughing or choking while eating Cardiac: S1-S2, no JVD, no tachycardia, no peripheral edema Abdomen: LBM 6/10, soft and nontender nondistended with normoactive bowel sounds.  Poor oral intake. Musculoskeletal: Patient noted with bilateral heel cord tightness with knees extended during recent PT evaluation Neurologic: CN 2-12 grossly intact. Sensation intact, Strength 4/5 x all 4 extremities.   Psychiatric: Alert and oriented times name place and year today season or month.  She was able to perform simple addition.  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 NPH Significantly abnormal cognitive screening tests (short Blessed test and SLUMS)  Appreciate assistance of neurology and neurosurgery. On 6/3 after large-volume LP patient did gain some improvement in mobility and mentation. Patient will need to follow-up with neurosurgery after discharge. **Neurologicaleam/neurosurgeon has documented that NPH is not emergent and does not need a LD trial before placement to SNF.  She can be followed up in the outpatient setting after discharge if a bed is available before can arrange for an LD trial**  Anorexia/constipation Poor oral intake/poor appetite has reemerged over the past several days and now she is requiring IV fluids to maintain adequate hydration  Abnormal urinalysis/UTI Patient had significant hyperglycemia with resultant UA consistent with UTI Unfortunately urine culture was never obtained Patient was initiated on IV Rocephin with a duration of 3 days with resolution of severe hyperglycemia  Diabetes mellitus 2 with hyperglycemia on insulin Hemoglobin A1c 11.0 Significant hyperglycemia resolved after treatment of underlying UTI Continue 70/30 to 6 units in p.m. 70/30 to 4 units with supper Continue IV fluids at 75 cc/h Continue SSI  Dyslipidemia Continue statin  History of bipolar 1 disorder/polysubstance abuse/cognitive impairment Continue escitalopram, Neurontin and Depakote Cleared by psych regarding orientation and capacity Functional cognitive evaluation per SLP and Occupational Therapy with significant abnormalities Will need to have repeat cognitive evaluations after VP shunt placed to ensure no residual abnormalities  Physical deconditioning/bilateral heel cord shortening -He recommends bilateral PRAFO boots on every 4 hours and off every 4 hour -Continue regular PT sessions -SNF at discharge   Other problems: Acute kidney injury on CKD  3a Acute kidney injury resolved after IV fluids Baseline creatinine 1.3  COPD Continue Singulair, nebs as needed, consider adding LABA  GERD Continue PPI  Hypokalemia/hypomagnesemia Resolved  Data Reviewed: Basic Metabolic Panel: Recent Labs  Lab 02/12/21 0811 02/14/21 0908  NA 133* 139  K 5.2* 4.5  CL 96* 103  CO2 29 27  GLUCOSE 607* 137*  BUN 16 13  CREATININE 1.02* 0.98  CALCIUM 8.8* 9.1   Liver Function Tests: No results for input(s): AST, ALT, ALKPHOS, BILITOT, PROT, ALBUMIN in the last 168 hours. No results for input(s): LIPASE, AMYLASE in the last 168 hours. No results for input(s): AMMONIA in the last 168 hours. CBC: Recent Labs  Lab 02/14/21 0908  WBC 7.7  HGB 11.3*  HCT 34.8*  MCV 90.6  PLT 159   Cardiac Enzymes: No results for input(s): CKTOTAL, CKMB, CKMBINDEX, TROPONINI in the last 168 hours. BNP (last 3 results) Recent Labs    01/14/21 0238 01/15/21 0230 01/16/21 0201  BNP 177.8* 115.9* 40.9    ProBNP (last 3 results) No results for input(s): PROBNP in the last 8760 hours.  CBG: Recent Labs  Lab 02/17/21 1238 02/17/21 1539 02/17/21 2011 02/17/21 2307 02/18/21 0321  GLUCAP 157* 164* 81 248* 211*    Recent Results (from the past 240 hour(s))  Culture, blood (routine x 2)     Status: None (Preliminary result)   Collection Time: 02/13/21  8:55 AM   Specimen: BLOOD  Result Value Ref Range Status   Specimen Description BLOOD RIGHT ANTECUBITAL  Final   Special  Requests   Final    BOTTLES DRAWN AEROBIC AND ANAEROBIC Blood Culture results may not be optimal due to an excessive volume of blood received in culture bottles   Culture   Final    NO GROWTH 4 DAYS Performed at [facility], [address], [place], [place] [address]    Report Status PENDING  Incomplete  Culture, blood (routine x 2)     Status: None (Preliminary result)   Collection Time: 02/13/21  9:03 AM   Specimen: BLOOD  Result Value Ref Range Status    Specimen Description BLOOD RIGHT ANTECUBITAL  Final   Special Requests   Final    BOTTLES DRAWN AEROBIC AND ANAEROBIC Blood Culture adequate volume   Culture   Final    NO GROWTH 4 DAYS Performed at [facility], [address], [place], [place] [address]    Report Status PENDING  Incomplete     Studies: No results found.  Scheduled Meds:  atorvastatin  20 mg Oral Daily   cholecalciferol  1,000 Units Oral Daily   divalproex  500 mg Oral BID   escitalopram  20 mg Oral Daily   feeding supplement (GLUCERNA SHAKE)  237 mL Oral TID AC & HS   gabapentin  300 mg Oral BID   insulin aspart  0-9 Units Subcutaneous Q4H   insulin aspart protamine- aspart  4 Units Subcutaneous Q supper   insulin aspart protamine- aspart  6 Units Subcutaneous Q breakfast   metFORMIN  1,000 mg Oral BID WC   montelukast  10 mg Oral QHS   pantoprazole  40 mg Oral Daily   senna-docusate  2 tablet Oral Daily   thiamine  100 mg Oral Daily   Continuous Infusions:  sodium chloride 75 mL/hr at 02/15/21 1923   vancomycin      Principal Problem:   Bipolar 1 disorder, depressed, full remission (HCC) Active Problems:   Bipolar 1 disorder (HCC)   Hypertension   Generalized weakness   Controlled type 2 diabetes mellitus with hyperglycemia, with long-term current use of insulin (HCC)   Hyperlipidemia   NPH (normal pressure hydrocephalus) (HCC)   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   Consultants: Psychiatry  Procedures: None  Antibiotics: Anti-infectives (From admission, onward)    Start     Dose/Rate Route Frequency Ordered Stop   02/14/21 0828  vancomycin (VANCOREADY) IVPB 1000 mg/200 mL        1,000 mg 200 mL/hr over 60 Minutes Intravenous 60 min pre-op 02/14/21 0828     02/13/21 2115  cefTRIAXone (ROCEPHIN) 1 g in sodium chloride 0.9 % 100 mL IVPB  Status:  Discontinued        1 g 200 mL/hr over 30 Minutes Intravenous Every 24 hours 02/13/21 2016 02/16/21 0844        Time spent: 25  minutes    [name] ANP  [facility] 7 am - 330 pm/M-F for direct patient care and secure chat Please refer to Amion for contact info 37  days

## 2021-02-18 NOTE — TOC Progression Note (Signed)
Transition of Care [facility]) - Progression Note    Patient Details  Name: Carolyn Norton MRN: [id number] Date of Birth: 1965/12/17  Transition of [facility]) CM/SW Contact  Carolyn Bridgeman, RN Phone Number: 02/18/2021, 10:53 AM  Clinical Narrative:    Case management called and left a message for Carolyn Norton, CM in business department at [facility] in [place], to determine if the facility was able to offer the patient a bed. Carolyn Norton, CM is out of the office for the day and will return to work tomorrow, 02/19/2021.  CM plans to call and speak with the [facility] facility tomorrow.  I called and spoke with the patient's father on the phone and he states that he is currently the Health Care Power of Attorney for the patient.  I updated the patient's father, Carolyn Norton on the phone and let him know that [facility] in [place], [place] was considering placement for the patient pending patient's Medicaid / disability determination.  The patient's father was concerned that the patient have LP test completed here at [facility] or have appropriate follow up with [facility] neurosurgery.  The patient's father Carolyn Norton states that he has been in communication with his lawyer and DSS worker Carolyn Norton on the phone regarding patient's determination for Medicaid / disability.  I called and left a message with Carolyn Norton, DSS caseworker on the phone - [phone] since  I was unable to reach her to communicate concerning this matter.  CM and MSW will continue to follow for [facility] placement and Medicaid / disability determination through financial counseling / DSS.   Expected Discharge Plan: Skilled Nursing Facility Barriers to Discharge: Family Issues, Continued Medical Work up  Expected Discharge Plan and Services Expected Discharge Plan: Skilled Nursing Facility In-house Referral: Clinical Social Work Discharge Planning Services: CM Consult Post Acute Care Choice: Skilled Nursing  Facility Living arrangements for the past 2 months: Single Family Home Expected Discharge Date: 01/16/21               DME Arranged: [name] rolling DME Agency: [employer] Date DME Agency Contacted: 01/20/21 Time DME Agency Contacted: 1435 Representative spoke with at DME Agency: [name]             Social Determinants of Health (SDOH) Interventions    Readmission Risk Interventions Readmission Risk Prevention Plan 01/14/2021  Transportation Screening Complete  PCP or Specialist Appt within 3-5 Days (No Data)  Social Work Consult for Recovery Care Planning/Counseling Complete  Palliative Care Screening Not Applicable  Medication Review [facility]) Referral to Pharmacy  Some recent data might be hidden

## 2021-02-18 NOTE — Progress Notes (Signed)
Occupational Therapy Treatment Patient Details Name: Carolyn Norton MRN: [id number] DOB: 09/22/1965 Today's Date: 02/18/2021    History of present illness Pt is a 55 y.o. female admitted 01/11/21 with multiple falls, AMS, weakness. Workup for severe dehydration, DKA, metabolic encephalopathy. 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 Neurosurgery following for possible lumbar drain trial vs repeat LP vs VP shunt placement. Placement of lumbar drain on 6/10 cancelled due to UTI. PMH includes DM2, COPD, CKD3, GERD, bipolar disorder, COVID-19, polysubstance abuse including cocaine.   OT comments  Pt having good session today with OT. Pt required less physical assist and was able to state correct location this session. Pt continues to present with impaired cogniton, decreased activity tolerance and impaired balance. Pt currently requires min guard assist to stand to stedy and supervision to completed seated grooming tasks at sink with MOD cues to sequence through steps. Pt would continue to benefit from skilled occupational therapy while admitted and after d/c to address the below listed limitations in order to improve overall functional mobility and facilitate independence with BADL participation. DC plan remains appropriate, will follow acutely per POC.    Follow Up Recommendations  SNF;Supervision/Assistance - 24 hour    Equipment Recommendations  3 in 1 bedside commode    Recommendations for Other Services      Precautions / Restrictions Precautions Precautions: Fall Restrictions Weight Bearing Restrictions: No       Mobility Bed Mobility Overal bed mobility: Needs Assistance Bed Mobility: Supine to Sit;Sit to Supine     Supine to sit: Min assist;+2 for physical assistance Sit to supine: Mod assist;+2 for physical assistance   General bed mobility comments: pt able to transition to EOB with MIN A +2 with pt able to maneuver BLEs to EOB but needed most  assist to elevate trunk into sitting, MOD A +2 to return to supine with pt able to lower trunk to supine but needed assist to elevate BLEs back to bed    Transfers Overall transfer level: Needs assistance Equipment used: Ambulation equipment used Transfers: Sit to/from Stand Sit to Stand: Min guard;From elevated surface Stand pivot transfers: Total assist       General transfer comment: min guard to rise to stedy from EOB and from seat of stedy, pt requries MAX multimodal cues at times to probelm solve how to stand from seat of stedy, ? some proprioception issues.  total A to pivot in stedy    Balance Overall balance assessment: Needs assistance;History of Falls Sitting-balance support: Feet supported;Bilateral upper extremity supported Sitting balance-Leahy Scale: Poor Sitting balance - Comments: pt able to sit EOB with min guard to MIN A with BUE support   Standing balance support: During functional activity;Bilateral upper extremity supported;Single extremity supported Standing balance-Leahy Scale: Poor Standing balance comment: pt was able to reach dynamically in stedy with one UE to retrieve wash cloths with no LOB and min guard assist                           ADL either performed or assessed with clinical judgement   ADL Overall ADL's : Needs assistance/impaired     Grooming: Wash/dry face;Brushing hair;Sitting Grooming Details (indicate cue type and reason): sitting on seat of stedy, cues to sequence washing face as pt perseverates on one step at a time having difficulty transitioning to next step             Lower Body Dressing:  Maximal assistance;Sit to/from stand Lower Body Dressing Details (indicate cue type and reason): MAX A to pull brief up to waist line in standing Toilet Transfer: Total assistance;Min guard Toilet Transfer Details (indicate cue type and reason): via stedy to pivot, min guard to stand to stedy         Functional mobility during  ADLs: Total assistance;Min guard (minguard to stand to stedy; total A to pivot in stedy) General ADL Comments: Patient continues to wax and wane but able to sit at sink for ADLs and requried less assist for functional mobility this session     Vision       Perception     Praxis      Cognition Arousal/Alertness: Awake/alert Behavior During Therapy: WFL for tasks assessed/performed Overall Cognitive Status: Impaired/Different from baseline Area of Impairment: Orientation;Attention;Memory;Following commands;Safety/judgement;Awareness;Problem solving                 Orientation Level: Disoriented to;Time (pt reports its 2002, unable to problem solve correct month even when max cues provided, unable to recalll correct orienatation questions at end of session) Current Attention Level: Sustained Memory: Decreased short-term memory Following Commands: Follows one step commands inconsistently;Follows one step commands with increased time Safety/Judgement: Decreased awareness of safety;Decreased awareness of deficits Awareness: Intellectual Problem Solving: Slow processing;Difficulty sequencing;Requires verbal cues;Decreased initiation;Requires tactile cues General Comments: pt was able to state she was at "[employer] today" cues needed to sequence ADL tasks such as washing face, slow to process and slow to initiate all mobility/ ADL tasks        Exercises     Shoulder Instructions       General Comments Patient functioning better this date compared to last OT visit.    Pertinent Vitals/ Pain       Pain Assessment: No/denies pain  Home Living                                          Prior Functioning/Environment              Frequency  Min 2X/week        Progress Toward Goals  OT Goals(current goals can now be found in the care plan section)  Progress towards OT goals: Progressing toward goals (slowly. Waxes and wanes.)  Acute Rehab OT  Goals Patient Stated Goal: none stated Time For Goal Achievement: 02/28/21 (OTR udpated goals 6/10) Potential to Achieve Goals: Fair  Plan Discharge plan remains appropriate;Frequency remains appropriate    Co-evaluation                 AM-PAC OT "6 Clicks" Daily Activity     Outcome Measure   Help from another person eating meals?: A Little Help from another person taking care of personal grooming?: A Lot Help from another person toileting, which includes using toliet, bedpan, or urinal?: A Lot Help from another person bathing (including washing, rinsing, drying)?: A Lot Help from another person to put on and taking off regular upper body clothing?: A Lot Help from another person to put on and taking off regular lower body clothing?: A Lot 6 Click Score: 13    End of Session Equipment Utilized During Treatment: Gait belt;Other (comment) (stedy; bilateral PRAFOs)  OT Visit Diagnosis: Unsteadiness on feet (R26.81);Other abnormalities of gait and mobility (R26.89);Muscle weakness (generalized) (M62.81);Other symptoms and signs involving cognitive function;Other symptoms and signs involving  the nervous system (R29.898)   Activity Tolerance Patient tolerated treatment well   Patient Left in bed;with call bell/phone within reach;with bed alarm set;Other (comment) (PRAFOs donned)   Nurse Communication Mobility status        Time: 5027-7412 OT Time Calculation (min): 20 min  Charges: OT General Charges $OT Visit: 1 Visit OT Treatments $Self Care/Home Management : 8-22 mins  Lenor Derrick., COTA/L Acute Rehabilitation Services [phone]    Barron Schmid 02/18/2021, 3:44 PM

## 2021-02-19 LAB — GLUCOSE, CAPILLARY
Glucose-Capillary: 71 mg/dL (ref 70–99)
Glucose-Capillary: 216 mg/dL — ABNORMAL HIGH (ref 70–99)
Glucose-Capillary: 188 mg/dL — ABNORMAL HIGH (ref 70–99)
Glucose-Capillary: 42 mg/dL — CL (ref 70–99)
Glucose-Capillary: 132 mg/dL — ABNORMAL HIGH (ref 70–99)
Glucose-Capillary: 260 mg/dL — ABNORMAL HIGH (ref 70–99)
Glucose-Capillary: 214 mg/dL — ABNORMAL HIGH (ref 70–99)

## 2021-02-19 MED ORDER — DEXTROSE-NACL 5-0.9 % IV SOLN: INTRAVENOUS | Status: AC

## 2021-02-19 NOTE — Progress Notes (Signed)
[facility] PROGRESS NOTE  Carolyn Norton ZDG:[id number] DOB: [birth year] DOA: 01/11/2021 PCP: [name], FNP     Status: Remains inpatient appropriate because:Unsafe d/c plan-due to patient's underlying poor mental status short-term memory deficits she will require 24/7 care after discharge  Dispo:  Patient From:  Home  Planned Disposition: SNF-facility in [place] is reviewing patient's chart  Medically stable for discharge:  NO  Barriers to DC: Continues to require significant assistance with mobility.  Continues to have significant short-term memory deficits impeding patient's ability to safely return home with elderly parents             Difficult to place: Yes  Level of care: Med-Surg  Code Status: DNR Family Communication: 6/10 Mother [name] updated DVT prophylaxis: Lovenox COVID vaccination status: Unknown   HPI: 55 year old female past medical history of diabetes mellitus type 2, bipolar disorder type I, previous COVID infection, polysubstance abuse that includes cocaine.  She presented from home where she lives with her parents.  Her presenting symptoms included generalized weakness, multiple falls and confusion.  Family confirmed that she had been having the symptoms for at least a month with last use of crack cocaine 1 month prior to presentation.  Ever since that time she had not been doing well.  She would also be having issues with hyperglycemia.  In the ER she was diagnosed with severe dehydration, hyperglycemia and metabolic encephalopathy.  Since admission patient has been evaluated by the psychiatric team who has deemed her alert and oriented and having capacity to make decisions.  Patient is agreeable to placement at a rehab.  Currently she has severe physical deconditioning which was POA and PT is recommending SNF.  Because of her history of polysubstance abuse and lack of funding SNF is not an option.  Family is agreeable to taking her back in home  when she is able to ambulate well enough to get back and forth for meals into the bathroom.  Subjective: Alert.  Still having some difficulty with orientation questions but overall pleasant.  Improved mobility according to most recent PT note.  Nursing to follow patient in regards to patient awareness of need to toilet.  Objective: Vitals:   02/18/21 1942 02/19/21 0514  BP: 132/69 133/64  Pulse: 79 81  Resp: 20 20  Temp: 98.1 F (36.7 C) 97.9 F (36.6 C)  SpO2: 96% 98%    Intake/Output Summary (Last 24 hours) at 02/19/2021 0740 Last data filed at 02/19/2021 0353 Gross per 24 hour  Intake --  Output 1200 ml  Net -1200 ml   Filed Weights   01/11/21 1152  Weight: 99.8 kg    Exam:  Constitutional: Alert, calm, pleasant and in no acute distress Respiratory: Lungs remain clear.  She is on room air with normal pulse oximetry Cardiac: Heart sounds are normal, pulses regular and nontachycardic.  She is normotensive.  Current IV fluids at 75 cc/h Abdomen: LBM 6/10, continues to report low appetite and eats only about 30% of meals.  Also marginal oral intake of fluids documented. Musculoskeletal: Patient noted with bilateral heel cord tightness with knees extended during recent PT evaluation Neurologic: CN 2-12 grossly intact. Sensation intact, Strength 4/5 x all 4 extremities.   Psychiatric: Alert and oriented to name and place.  Not to year or month/season.  Able to perform simple addition.    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 NPH Significantly abnormal cognitive screening tests (short Blessed test and SLUMS)  Appreciate assistance of neurology and neurosurgery. On 6/3 after large-volume LP patient did gain some improvement in mobility and mentation.  Per neurology and neurosurgery her imaging is not a "slam dunk" to definitely confirm the diagnosis of symptomatic NPH therefore it is suspected her symptoms are possibly explained by other undetermined causes. Patient will need to follow-up with neurosurgery after discharge. **Neurologicaleam/neurosurgeon has documented that NPH is not emergent and does not need a LD trial before placement to SNF.  She can be followed up in the outpatient setting after discharge if a bed is available before can arrange for an LD trial**  Anorexia/constipation Needs with poor oral intake including of fluids. Currently adequately hydrated with IV fluids but this could be knocking out her drive to drink fluids so therefore will decrease to keep open  Abnormal urinalysis/UTI Patient had significant hyperglycemia with resultant UA consistent with UTI Unfortunately urine culture was never obtained Patient was initiated on IV Rocephin with a duration of 3 days with resolution of severe hyperglycemia  Diabetes mellitus 2 with hyperglycemia on insulin Hemoglobin A1c 11.0 Significant hyperglycemia resolved after treatment of underlying UTI Continue 70/30 to 6 units in p.m. 70/30 to 4 units with supper Continue IV fluids at 75 cc/h Continue SSI  Dyslipidemia Continue statin  History of bipolar 1 disorder/polysubstance abuse/cognitive impairment Continue escitalopram, Neurontin and Depakote Cleared by psych regarding orientation and capacity Functional cognitive evaluation per SLP and Occupational Therapy with significant abnormalities  Physical deconditioning/bilateral heel cord shortening -He recommends bilateral PRAFO boots on every 4 hours and off every 4  hour -Continue regular PT sessions -SNF at discharge   Other problems: Acute kidney injury on CKD 3a Acute kidney injury resolved after IV fluids Baseline creatinine 1.3  COPD Continue Singulair, nebs as needed, consider adding LABA  GERD Continue PPI  Hypokalemia/hypomagnesemia Resolved  Data Reviewed: Basic Metabolic Panel: Recent Labs  Lab 02/12/21 0811 02/14/21 0908  NA 133* 139  K 5.2* 4.5  CL 96* 103  CO2 29 27  GLUCOSE 607* 137*  BUN 16 13  CREATININE 1.02* 0.98  CALCIUM 8.8* 9.1   Liver Function Tests: No results for input(s): AST, ALT, ALKPHOS, BILITOT, PROT, ALBUMIN in the last 168 hours. No results for input(s): LIPASE, AMYLASE in the last 168 hours. No results for input(s): AMMONIA in the last 168 hours. CBC: Recent Labs  Lab 02/14/21 0908  WBC 7.7  HGB 11.3*  HCT 34.8*  MCV 90.6  PLT 159   Cardiac Enzymes: No results for input(s): CKTOTAL, CKMB, CKMBINDEX, TROPONINI in the last 168 hours. BNP (last 3 results) Recent Labs    01/14/21 0238 01/15/21 0230 01/16/21 0201  BNP 177.8* 115.9* 40.9    ProBNP (last 3 results) No results for input(s): PROBNP in the last 8760 hours.  CBG: Recent Labs  Lab 02/18/21 1203 02/18/21 1600 02/18/21 1945 02/18/21 2304 02/19/21 0523  GLUCAP 171* 108* 210* 154* 71    Recent Results (from the past 240 hour(s))  Culture, blood (routine x 2)     Status: None   Collection Time: 02/13/21  8:55 AM   Specimen: BLOOD  Result Value Ref  Range Status   Specimen Description BLOOD RIGHT ANTECUBITAL  Final   Special Requests   Final    BOTTLES DRAWN AEROBIC AND ANAEROBIC Blood Culture results may not be optimal due to an excessive volume of blood received in culture bottles   Culture   Final    NO GROWTH 5 DAYS Performed at [facility], [address] [place], [place] [address]    Report Status 02/18/2021 FINAL  Final  Culture, blood (routine x 2)     Status: None   Collection Time: 02/13/21   9:03 AM   Specimen: BLOOD  Result Value Ref Range Status   Specimen Description BLOOD RIGHT ANTECUBITAL  Final   Special Requests   Final    BOTTLES DRAWN AEROBIC AND ANAEROBIC Blood Culture adequate volume   Culture   Final    NO GROWTH 5 DAYS Performed at [facility], [address] [place], [place] [address]    Report Status 02/18/2021 FINAL  Final     Studies: No results found.  Scheduled Meds:  atorvastatin  20 mg Oral Daily   cholecalciferol  1,000 Units Oral Daily   divalproex  500 mg Oral BID   escitalopram  20 mg Oral Daily   feeding supplement (GLUCERNA SHAKE)  237 mL Oral TID AC & HS   gabapentin  300 mg Oral BID   insulin aspart  0-9 Units Subcutaneous Q4H   insulin aspart protamine- aspart  4 Units Subcutaneous Q supper   insulin aspart protamine- aspart  6 Units Subcutaneous Q breakfast   metFORMIN  1,000 mg Oral BID WC   montelukast  10 mg Oral QHS   pantoprazole  40 mg Oral Daily   senna-docusate  2 tablet Oral Daily   thiamine  100 mg Oral Daily   Continuous Infusions:  sodium chloride 75 mL/hr at 02/19/21 0702   vancomycin      Principal Problem:   Bipolar 1 disorder, depressed, full remission (HCC) Active Problems:   Bipolar 1 disorder (HCC)   Hypertension   Generalized weakness   Controlled type 2 diabetes mellitus with hyperglycemia, with long-term current use of insulin (HCC)   Hyperlipidemia   NPH (normal pressure hydrocephalus) (HCC)   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   Consultants: Psychiatry  Procedures: None  Antibiotics: Anti-infectives (From admission, onward)    Start     Dose/Rate Route Frequency Ordered Stop   02/14/21 0828  vancomycin (VANCOREADY) IVPB 1000 mg/200 mL        1,000 mg 200 mL/hr over 60 Minutes Intravenous 60 min pre-op 02/14/21 0828     02/13/21 2115  cefTRIAXone (ROCEPHIN) 1 g in sodium chloride 0.9 % 100 mL IVPB  Status:  Discontinued        1 g 200 mL/hr over 30 Minutes Intravenous Every 24 hours  02/13/21 2016 02/16/21 0844        Time spent: 25 minutes    [name] ANP  [facility] 7 am - 330 pm/M-F for direct patient care and secure chat Please refer to Amion for contact info 38  days

## 2021-02-19 NOTE — NC FL2 (Signed)
[place] MEDICAID FL2 LEVEL OF CARE SCREENING TOOL     IDENTIFICATION  Patient Name: Carolyn Norton Birthdate: 07-May-1966 Sex: female Admission Date (Current Location): 01/11/2021  [facility] and [place] Number:  Producer, television/film/video and Address:  [facility], [address], [place], [place] [address]      Provider Number: [id number]  Attending Physician Name and Address:  Jerald Kief, MD  Relative Name and Phone Number:  Arah Aro [phone]    Current Level of Care: Hospital Recommended Level of Care: Skilled Nursing Facility Prior Approval Number:    Date Approved/Denied: 02/19/21 PASRR Number: [id number]  Discharge Plan: SNF    Current Diagnoses: Patient Active Problem List   Diagnosis Date Noted   Controlled type 2 diabetes mellitus with hyperglycemia, with long-term current use of insulin (HCC) 02/11/2021   Hyperlipidemia    NPH (normal pressure hydrocephalus) (HCC)    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    Bipolar 1 disorder, depressed, full remission (HCC) 01/18/2021   Generalized weakness 01/11/2021   Acute metabolic encephalopathy 10/22/2020   Hypotension 10/22/2020   Leukocytosis 10/22/2020   Type 2 diabetes mellitus (HCC) 10/22/2020   Altered mental status    Hyperosmolar hyperglycemic state (HHS) (HCC)    Hyperglycemia    Hyperkalemia    AKI (acute kidney injury) (HCC)    Hypertension    DKA (diabetic ketoacidosis) (HCC) 09/06/2020   COVID-19 09/06/2020   Bipolar 1 disorder (HCC) 09/06/2020   Breast mass status post excision 09/06/2020   Smoker 09/06/2020   Metabolic acidosis due to ingestion of drugs or chemicals 09/06/2020    Orientation RESPIRATION BLADDER Height & Weight     Self, Place, Situation  Normal Incontinent Weight: 99.8 kg Height:  5\' 6"  (167.6 cm)  BEHAVIORAL SYMPTOMS/MOOD NEUROLOGICAL BOWEL NUTRITION STATUS     (Normal Pressure Hydrocephalus) Incontinent Diet (See Discharge Summary)   AMBULATORY STATUS COMMUNICATION OF NEEDS Skin   Extensive Assist Verbally Normal                       Personal Care Assistance Level of Assistance  Bathing, Feeding, Dressing Bathing Assistance: Limited assistance Feeding assistance: Limited assistance Dressing Assistance: Limited assistance     Functional Limitations Info  Sight, Hearing, Speech Sight Info: Adequate Hearing Info: Adequate Speech Info: Adequate    SPECIAL CARE FACTORS FREQUENCY  PT (By licensed PT), OT (By licensed OT)     PT Frequency: 2-3 times per week OT Frequency: 2-3 times per week            Contractures Contractures Info: Not present    Additional Factors Info  Code Status, Allergies, Psychotropic Code Status Info: DNR Allergies Info: Erythromycin Psychotropic Info: Depakote, Lexapro, Neurontin, Ativan Insulin Sliding Scale Info: See discharge summary Isolation Precautions Info: None Suctioning Needs: n/a   Current Medications (02/19/2021):  This is the current hospital active medication list Current Facility-Administered Medications  Medication Dose Route Frequency Provider Last Rate Last Admin   0.9 %  sodium chloride infusion   Intravenous Continuous 02/21/2021, NP 75 mL/hr at 02/19/21 0702 New Bag at 02/19/21 0702   acetaminophen (TYLENOL) tablet 650 mg  650 mg Oral Q6H PRN 02/21/21 N, DO   650 mg at 02/13/21 2300   albuterol (PROVENTIL) (2.5 MG/3ML) 0.083% nebulizer solution 3 mL  3 mL Nebulization Q6H PRN 04/15/21 N, DO       atorvastatin (LIPITOR) tablet 20 mg  20 mg Oral Daily Dow Adolph N, DO   20 mg at 02/18/21 3903   bisacodyl (DULCOLAX) suppository 10 mg  10 mg Rectal Daily PRN Tyrone Nine, MD       cholecalciferol (VITAMIN D3) tablet 1,000 Units  1,000 Units Oral Daily Russella Dar, NP   1,000 Units at 02/18/21 0905   dextrose 50 % solution 0-50 mL  0-50 mL Intravenous PRN Mancel Bale, MD   50 mL at 01/26/21 1722   divalproex (DEPAKOTE) DR tablet  500 mg  500 mg Oral BID Leroy Sea, MD   500 mg at 02/18/21 2059   escitalopram (LEXAPRO) tablet 20 mg  20 mg Oral Daily Dow Adolph N, DO   20 mg at 02/18/21 0905   famotidine (PEPCID) tablet 20 mg  20 mg Oral BID PRN Russella Dar, NP       feeding supplement (GLUCERNA SHAKE) (GLUCERNA SHAKE) liquid 237 mL  237 mL Oral TID AC & HS Russella Dar, NP   237 mL at 02/18/21 1745   gabapentin (NEURONTIN) capsule 300 mg  300 mg Oral BID Leroy Sea, MD   300 mg at 02/18/21 2058   insulin aspart (novoLOG) injection 0-9 Units  0-9 Units Subcutaneous Q4H Russella Dar, NP   2 Units at 02/19/21 0018   insulin aspart protamine- aspart (NOVOLOG MIX 70/30) injection 4 Units  4 Units Subcutaneous Q supper Russella Dar, NP   4 Units at 02/18/21 1738   insulin aspart protamine- aspart (NOVOLOG MIX 70/30) injection 6 Units  6 Units Subcutaneous Q breakfast Russella Dar, NP   6 Units at 02/18/21 0853   LORazepam (ATIVAN) injection 1 mg  1 mg Intravenous Once PRN Bhagat, Srishti L, MD       melatonin tablet 3 mg  3 mg Oral QHS PRN Dow Adolph N, DO   3 mg at 02/17/21 2132   metFORMIN (GLUCOPHAGE) tablet 1,000 mg  1,000 mg Oral BID WC Russella Dar, NP   1,000 mg at 02/18/21 1736   montelukast (SINGULAIR) tablet 10 mg  10 mg Oral QHS Hall, Enid Derry N, DO   10 mg at 02/18/21 2059   ondansetron (ZOFRAN) injection 4 mg  4 mg Intravenous Q6H PRN Dow Adolph N, DO   4 mg at 02/03/21 1946   pantoprazole (PROTONIX) EC tablet 40 mg  40 mg Oral Daily Darlin Drop, DO   40 mg at 02/18/21 0092   senna-docusate (Senokot-S) tablet 2 tablet  2 tablet Oral Daily Tyrone Nine, MD   2 tablet at 02/18/21 3300   thiamine tablet 100 mg  100 mg Oral Daily Leda Gauze, NP   100 mg at 02/18/21 0905   vancomycin (VANCOREADY) IVPB 1000 mg/200 mL  1,000 mg Intravenous 60 min Pre-Op Dawley, Alan Mulder, DO         Discharge Medications: Please see discharge summary for a list of discharge  medications.  Relevant Imaging Results:  Relevant Lab Results:   Additional Information SS [id number], patient will need LOG for placement while Medicaid and disability are pending  Janae Bridgeman, RN

## 2021-02-19 NOTE — TOC Progression Note (Addendum)
Transition of Care [facility]) - Progression Note    Patient Details  Name: Carolyn Norton MRN: [id number] Date of Birth: 05/14/1966  Transition of Care [facility]) CM/SW [place], RN Phone Number: 02/19/2021, 7:49 AM  Clinical Narrative:    CM met at the bedside to complete level 2 PASRR - [id number].  I spoke with the patient's father yesterday and he is aware that the DTP Team is looking for [facility] options outside of the [place] area while the Medicaid and disability are currently pending.  The patient will need an outpatient Neuroconsult arranged possibly at [facility] system to follow up for patient's NPH  - normal pressure hydrocephalus - Erin Hearing, NP will arrange if the patient is able to get bed offer at [facility] in [place].  I plan to follow up with Casper Harrison, CM at facility today. Pete Pelt, MSW supervisor will have to arrange ambulance transport through [facility] [place] transport and LOG for [facility] placement.  CM and MSW will continue to follow for needed SNF placement.  02/19/2021 1047 - I called and left another voice message with Purnell Shoemaker, DSS caseworker [phone]) regarding Medicaid and disability determination follow up.  02/19/2021 1353 - I asked Gayla Medicus, CM with [facility] to see if either [facility] or [facility] in [place] may have available beds for the patient.  The patient will need Neuro follow up regarding NPH.   Expected Discharge Plan: Skilled Nursing Facility Barriers to Discharge: Family Issues, Continued Medical Work up  Expected Discharge Plan and Services Expected Discharge Plan: [place] In-house Referral: Clinical Social Work Discharge Planning Services: CM Consult Post Acute Care Choice: [place] arrangements for the past 2 months: Single Family Home Expected Discharge Date: 01/16/21               DME Arranged: Berta Minor rolling DME Agency: [employer] Date  DME Agency Contacted: 01/20/21 Time DME Agency Contacted: [phone] Representative spoke with at DME Agency: Adela Lank             Social Determinants of Health ([place]) Interventions    Readmission Risk Interventions Readmission Risk Prevention Plan 01/14/2021  Transportation Screening Complete  PCP or Specialist Appt within 3-5 Days (No Data)  Social Work Consult for [place] Planning/Counseling [place] Not Applicable  Medication Review Press photographer) Referral to Pharmacy  Some recent data might be hidden

## 2021-02-20 LAB — GLUCOSE, CAPILLARY
Glucose-Capillary: 115 mg/dL — ABNORMAL HIGH (ref 70–99)
Glucose-Capillary: 118 mg/dL — ABNORMAL HIGH (ref 70–99)
Glucose-Capillary: 203 mg/dL — ABNORMAL HIGH (ref 70–99)
Glucose-Capillary: 227 mg/dL — ABNORMAL HIGH (ref 70–99)
Glucose-Capillary: 240 mg/dL — ABNORMAL HIGH (ref 70–99)
Glucose-Capillary: 245 mg/dL — ABNORMAL HIGH (ref 70–99)
Glucose-Capillary: 32 mg/dL — CL (ref 70–99)

## 2021-02-20 MED ORDER — INSULIN ASPART PROT & ASPART (70-30 MIX) 100 UNIT/ML ~~LOC~~ SUSP
2.0000 [IU] | Freq: Every day | SUBCUTANEOUS | Status: DC
  Administered 2021-02-20 – 2021-02-28 (×7): 2 [IU] via SUBCUTANEOUS

## 2021-02-20 MED ORDER — INSULIN ASPART PROT & ASPART (70-30 MIX) 100 UNIT/ML ~~LOC~~ SUSP
6.0000 [IU] | Freq: Every day | SUBCUTANEOUS | Status: DC
  Administered 2021-02-20 – 2021-03-01 (×10): 6 [IU] via SUBCUTANEOUS

## 2021-02-20 NOTE — Progress Notes (Signed)
[facility] PROGRESS NOTE  Carolyn Norton BJS:[id number] DOB: 1966/03/16 DOA: 01/11/2021 PCP: [name], FNP     Status: Remains inpatient appropriate because:Unsafe d/c plan-due to patient's underlying poor mental status short-term memory deficits she will require 24/7 care after discharge  Dispo:  Patient From:  Home  Planned Disposition: SNF-facility in [place] is reviewing patient's chart  Medically stable for discharge:  NO  Barriers to DC: Continues to require significant assistance with mobility.  Continues to have significant short-term memory deficits impeding patient's ability to safely return home with elderly parents             Difficult to place: Yes  Level of care: Med-Surg  Code Status: DNR Family Communication: 6/10 Mother [name] updated DVT prophylaxis: Lovenox COVID vaccination status: Unknown   HPI: 55 year old female past medical history of diabetes mellitus type 2, bipolar disorder type I, previous COVID infection, polysubstance abuse that includes cocaine.  She presented from home where she lives with her parents.  Her presenting symptoms included generalized weakness, multiple falls and confusion.  Family confirmed that she had been having the symptoms for at least a month with last use of crack cocaine 1 month prior to presentation.  Ever since that time she had not been doing well.  She would also be having issues with hyperglycemia.  In the ER she was diagnosed with severe dehydration, hyperglycemia and metabolic encephalopathy.  Since admission patient has been evaluated by the psychiatric team who has deemed her alert and oriented and having capacity to make decisions.  Patient is agreeable to placement at a rehab.  Currently she has severe physical deconditioning which was POA and PT is recommending SNF.  Because of her history of polysubstance abuse and lack of funding SNF is not an option.  Family is agreeable to taking her back in home  when she is able to ambulate well enough to get back and forth for meals into the bathroom.  Subjective: Remains pleasantly confused.  In the process of being Sharyn Dross reports feeling cold.  Continues to report lack of appetite.  Objective: Vitals:   02/19/21 1934 02/20/21 0509  BP: 132/68 (!) 146/89  Pulse: 76 83  Resp: 20 20  Temp: 98.2 F (36.8 C) 97.8 F (36.6 C)  SpO2: 98% 96%    Intake/Output Summary (Last 24 hours) at 02/20/2021 0731 Last data filed at 02/20/2021 0700 Gross per 24 hour  Intake 2002.91 ml  Output 900 ml  Net 1102.91 ml   Filed Weights   01/11/21 1152  Weight: 99.8 kg    Exam:  Constitutional: Calm and pleasant, no acute distress Respiratory: Lungs remain clear.  Stable on room air Cardiac: S1-S2, pulse remains regular nontachycardic, no peripheral edema. Abdomen: LBM 6/15, there with normoactive bowel sounds.  Poor oral intake Musculoskeletal: Patient noted with bilateral heel cord tightness with knees extended during recent PT evaluation Neurologic: CN 2-12 grossly intact. Sensation intact, Strength 4/5 x all 4 extremities.   Psychiatric: Alert and oriented to name and place.  Not to year or season.  Lack insight and capacity.    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 NPH Significantly abnormal cognitive screening tests (  short Blessed test and SLUMS)  Appreciate assistance of neurology and neurosurgery. On 6/3 after large-volume LP patient did gain some improvement in mobility and mentation.  Per neurology and neurosurgery her imaging is not a  "slam dunk" to definitely confirm the diagnosis of symptomatic NPH therefore it is suspected her symptoms are possibly explained by other undetermined causes. Patient will need to follow-up with neurosurgery after discharge. **Neurologicaleam/neurosurgeon has documented that NPH is not emergent and does not need a LD trial before placement to SNF.  She can be followed up in the outpatient setting after discharge if a bed is available before can arrange for an LD trial**  Anorexia/constipation Persistent issue Patient encouraged to at least drink protein beverages if she feels solid diet is not unpalatable  Abnormal urinalysis/UTI Patient had significant hyperglycemia with resultant UA consistent with UTI Urine culture ordered but was never obtained Days of Rocephin  Diabetes mellitus 2 with hyperglycemia on insulin Hemoglobin A1c 11.0 Given recurrent hypoglycemia secondary to poor intake Will decrease 70/30 to 2 units BID and if still having hypoglycemia and poor intake will change to SSI only  Dyslipidemia Continue statin  History of bipolar 1 disorder/polysubstance abuse/cognitive impairment Continue escitalopram, Neurontin and Depakote Cleared by psych regarding orientation and capacity Functional cognitive evaluation per SLP and Occupational Therapy with significant abnormalities  Physical deconditioning/bilateral heel cord shortening -He recommends bilateral PRAFO boots on every 4 hours and off every 4 hours -SNF at discharge   Other problems: Acute kidney injury on CKD 3a Acute kidney injury resolved after IV fluids Baseline creatinine 1.3  COPD Continue Singulair, nebs as needed, consider adding LABA  GERD Continue PPI  Hypokalemia/hypomagnesemia Resolved  Data Reviewed: Basic Metabolic Panel: Recent Labs  Lab 02/14/21 0908  NA 139  K 4.5  CL 103  CO2 27  GLUCOSE 137*  BUN 13  CREATININE 0.98  CALCIUM 9.1   Liver Function Tests: No results for  input(s): AST, ALT, ALKPHOS, BILITOT, PROT, ALBUMIN in the last 168 hours. No results for input(s): LIPASE, AMYLASE in the last 168 hours. No results for input(s): AMMONIA in the last 168 hours. CBC: Recent Labs  Lab 02/14/21 0908  WBC 7.7  HGB 11.3*  HCT 34.8*  MCV 90.6  PLT 159   Cardiac Enzymes: No results for input(s): CKTOTAL, CKMB, CKMBINDEX, TROPONINI in the last 168 hours. BNP (last 3 results) Recent Labs    01/14/21 0238 01/15/21 0230 01/16/21 0201  BNP 177.8* 115.9* 40.9    ProBNP (last 3 results) No results for input(s): PROBNP in the last 8760 hours.  CBG: Recent Labs  Lab 02/19/21 1621 02/19/21 1644 02/19/21 1937 02/19/21 2310 02/20/21 0319  GLUCAP 42* 132* 260* 214* 245*    Recent Results (from the past 240 hour(s))  Culture, blood (routine x 2)     Status: None   Collection Time: 02/13/21  8:55 AM   Specimen: BLOOD  Result Value Ref Range Status   Specimen Description BLOOD RIGHT ANTECUBITAL  Final   Special Requests   Final    BOTTLES DRAWN AEROBIC AND ANAEROBIC Blood Culture results may not be optimal due to an excessive volume of blood received in culture bottles   Culture   Final    NO GROWTH 5 DAYS Performed at [facility], [address], [place], [place] [address]    Report Status 02/18/2021 FINAL  Final  Culture, blood (routine x 2)     Status: None   Collection Time: 02/13/21  9:03 AM   Specimen: BLOOD  Result Value Ref Range Status   Specimen Description BLOOD RIGHT ANTECUBITAL  Final   Special Requests   Final    BOTTLES DRAWN AEROBIC AND ANAEROBIC Blood Culture adequate volume   Culture   Final    NO GROWTH 5 DAYS Performed at [facility], [address] [place], [place] [address]    Report Status 02/18/2021 FINAL  Final     Studies: No results found.  Scheduled Meds:  atorvastatin  20 mg Oral Daily   cholecalciferol  1,000 Units Oral Daily   divalproex  500 mg Oral BID   escitalopram  20 mg Oral Daily    feeding supplement (GLUCERNA SHAKE)  237 mL Oral TID AC & HS   gabapentin  300 mg Oral BID   insulin aspart  0-9 Units Subcutaneous Q4H   insulin aspart protamine- aspart  4 Units Subcutaneous Q supper   insulin aspart protamine- aspart  6 Units Subcutaneous Q breakfast   metFORMIN  1,000 mg Oral BID WC   montelukast  10 mg Oral QHS   pantoprazole  40 mg Oral Daily   senna-docusate  2 tablet Oral Daily   thiamine  100 mg Oral Daily   Continuous Infusions:  dextrose 5 % and 0.9% NaCl 50 mL/hr at 02/19/21 1638   vancomycin      Principal Problem:   Bipolar 1 disorder, depressed, full remission (HCC) Active Problems:   Bipolar 1 disorder (HCC)   Hypertension   Generalized weakness   Controlled type 2 diabetes mellitus with hyperglycemia, with long-term current use of insulin (HCC)   Hyperlipidemia   NPH (normal pressure hydrocephalus) (HCC)   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   Consultants: Psychiatry  Procedures: None  Antibiotics: Anti-infectives (From admission, onward)    Start     Dose/Rate Route Frequency Ordered Stop   02/14/21 0828  vancomycin (VANCOREADY) IVPB 1000 mg/200 mL        1,000 mg 200 mL/hr over 60 Minutes Intravenous 60 min pre-op 02/14/21 0828     02/13/21 2115  cefTRIAXone (ROCEPHIN) 1 g in sodium chloride 0.9 % 100 mL IVPB  Status:  Discontinued        1 g 200 mL/hr over 30 Minutes Intravenous Every 24 hours 02/13/21 2016 02/16/21 0844        Time spent: 25 minutes    [name] ANP  [facility] 7 am - 330 pm/M-F for direct patient care and secure chat Please refer to Amion for contact info 39  days

## 2021-02-20 NOTE — Progress Notes (Signed)
Occupational Therapy Treatment Patient Details Name: Carolyn Norton MRN: [id number] DOB: 1966/01/17 Today's Date: 02/20/2021    History of present illness Pt is a 55 y.o. female admitted 01/11/21 with multiple falls, AMS, weakness. Workup for severe dehydration, DKA, metabolic encephalopathy. 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 Neurosurgery following for possible lumbar drain trial vs repeat LP vs VP shunt placement. Placement of lumbar drain on 6/10 cancelled due to UTI. PMH includes DM2, COPD, CKD3, GERD, bipolar disorder, COVID-19, polysubstance abuse including cocaine.   OT comments  Pt received leaning out of chair with no awareness. Assisted pt back to bed with use of stedy, pt required MIN A - min guard to stand to stedy with MAX cues for body awareness. Pt continues to present with cognitive impairments, generalized deconditioning and impaired safety awareness. Pt would continue to benefit from skilled occupational therapy while admitted and after d/c to address the below listed limitations in order to improve overall functional mobility and facilitate independence with BADL participation. DC plan remains appropriate, will follow acutely per POC.    Follow Up Recommendations  SNF;Supervision/Assistance - 24 hour    Equipment Recommendations  3 in 1 bedside commode    Recommendations for Other Services      Precautions / Restrictions Precautions Precautions: Fall Restrictions Weight Bearing Restrictions: No       Mobility Bed Mobility Overal bed mobility: Needs Assistance Bed Mobility: Sit to Supine Rolling: Min assist (with rail) Sidelying to sit: Mod assist;HOB elevated   Sit to supine: Mod assist   General bed mobility comments: MODA  needed to assist pt with elevating BLEs back to bed, pt able to lower trunk back to supine when cue provided of "place your head on pillow"    Transfers Overall transfer level: Needs  assistance Equipment used: Ambulation equipment used Transfers: Sit to/from Stand;Stand Pivot Transfers Sit to Stand: Min guard;Min assist Stand pivot transfers: Total assist       General transfer comment: pt able to stand to stedy with recliner with MIN A and stand from seat of stedy with min gaurd assist, MAX cues needed to sequence steps    Balance Overall balance assessment: Needs assistance;History of Falls Sitting-balance support: Feet supported;Bilateral upper extremity supported Sitting balance-Leahy Scale: Poor Sitting balance - Comments: pt able to sit EOB with min guard to MIN A with BUE support Postural control: Posterior lean Standing balance support: During functional activity;Bilateral upper extremity supported Standing balance-Leahy Scale: Poor Standing balance comment: BUE support needed to static stand in stedy                           ADL either performed or assessed with clinical judgement   ADL Overall ADL's : Needs assistance/impaired                         Toilet Transfer: Total assistance;Min guard Toilet Transfer Details (indicate cue type and reason): via stedy to pivot, min guard to stand to stedy         Functional mobility during ADLs: Total assistance;Min guard (minguard to stand to stedy; total A to pivot in stedy) General ADL Comments: Patient continues to wax and wane but able to stand to stedy with min guard assist     Vision       Perception     Praxis      Cognition Arousal/Alertness: Awake/alert Behavior During Therapy:  WFL for tasks assessed/performed Overall Cognitive Status: Impaired/Different from baseline Area of Impairment: Attention;Memory;Following commands;Safety/judgement;Awareness;Problem solving                 Orientation Level: Disoriented to;Time;Situation Current Attention Level: Sustained Memory: Decreased short-term memory (repeated cues) Following Commands: Follows one step  commands inconsistently;Follows one step commands with increased time Safety/Judgement: Decreased awareness of safety;Decreased awareness of deficits Awareness: Intellectual Problem Solving: Slow processing;Difficulty sequencing;Requires verbal cues;Decreased initiation;Requires tactile cues          Exercises    Shoulder Instructions       General Comments      Pertinent Vitals/ Pain       Pain Assessment: No/denies pain Faces Pain Scale: No hurt  Home Living                                          Prior Functioning/Environment              Frequency  Min 2X/week        Progress Toward Goals  OT Goals(current goals can now be found in the care plan section)  Progress towards OT goals: Progressing toward goals (slowly, goal progression fluctuates)  Acute Rehab OT Goals Patient Stated Goal: none stated Time For Goal Achievement: 02/28/21 Potential to Achieve Goals: Fair  Plan Discharge plan remains appropriate;Frequency remains appropriate    Co-evaluation                 AM-PAC OT "6 Clicks" Daily Activity     Outcome Measure   Help from another person eating meals?: A Little Help from another person taking care of personal grooming?: A Little Help from another person toileting, which includes using toliet, bedpan, or urinal?: A Lot Help from another person bathing (including washing, rinsing, drying)?: A Lot Help from another person to put on and taking off regular upper body clothing?: A Lot Help from another person to put on and taking off regular lower body clothing?: A Lot 6 Click Score: 14    End of Session Equipment Utilized During Treatment: Gait belt;Other (comment) (stedy)  OT Visit Diagnosis: Unsteadiness on feet (R26.81);Other abnormalities of gait and mobility (R26.89);Muscle weakness (generalized) (M62.81);Other symptoms and signs involving cognitive function;Other symptoms and signs involving the nervous system  (R29.898)   Activity Tolerance Patient tolerated treatment well   Patient Left in bed;with call bell/phone within reach;with bed alarm set;with nursing/sitter in room   Nurse Communication Mobility status;Other (comment) (pt back in bed)        Time: 1448-1856 OT Time Calculation (min): 12 min  Charges: OT General Charges $OT Visit: 1 Visit OT Treatments $Self Care/Home Management : 8-22 mins  {Marton Malizia Lorenso Courier., COTA/L Acute Rehabilitation Services [phone]    Barron Schmid 02/20/2021, 4:30 PM

## 2021-02-20 NOTE — Progress Notes (Signed)
Physical Therapy Treatment Patient Details Name: Carolyn Norton MRN: [id number] DOB: [birth year] Today's Date: 02/20/2021    History of Present Illness Pt is a 55 y.o. female admitted 01/11/21 with multiple falls, AMS, weakness. Workup for severe dehydration, DKA, metabolic encephalopathy. 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 Neurosurgery following for possible lumbar drain trial vs repeat LP vs VP shunt placement. Placement of lumbar drain on 6/10 cancelled due to UTI. PMH includes DM2, COPD, CKD3, GERD, bipolar disorder, COVID-19, polysubstance abuse including cocaine.    PT Comments    Patient remains pleasantly confused with slow, delayed processing. PRAFOs donned for standing in stedy with hopes of reducing posterior lean--perhaps slightly better. NT in during session to assist with cleaning pt as she had been incontinent prior to PT's arrival.     Follow Up Recommendations  SNF (family wants to take pt home but cannot at current functional level)     Equipment Recommendations  Rolling walker with 5" wheels;3in1 (PT);Hospital bed;Wheelchair (measurements PT);Other (comment) (hoyer lift; *reassess after lumbar drain)    Recommendations for Other Services       Precautions / Restrictions Precautions Precautions: Fall    Mobility  Bed Mobility Overal bed mobility: Needs Assistance Bed Mobility: Supine to Sit Rolling: Min assist (with rail) Sidelying to sit: Mod assist;HOB elevated       General bed mobility comments: wearing PRAFOs with walking soles, pt required assist to move legs over EOB; vc for reaching across with RUE to rail and then pt able to raise torso wtih assist    Transfers Overall transfer level: Needs assistance Equipment used: Ambulation equipment used Transfers: Sit to/from Stand Sit to Stand: Min assist         General transfer comment: from EOB (elevated due to stedy) min to minguard assist (x 3 reps); from seat of  stedy and from recliner min assist (more tactile cues and very little boosting/lifting required); however pt does continue with posterior lean despite PRAFOs (continues wiht tight heel cords)  Ambulation/Gait                 Stairs             Wheelchair Mobility    Modified Rankin (Stroke Patients Only)       Balance Overall balance assessment: Needs assistance;History of Falls Sitting-balance support: Feet supported;Bilateral upper extremity supported Sitting balance-Leahy Scale: Poor Sitting balance - Comments: pt able to sit EOB with min guard to MIN A with BUE support Postural control: Posterior lean Standing balance support: During functional activity;Bilateral upper extremity supported;Single extremity supported Standing balance-Leahy Scale: Poor                              Cognition Arousal/Alertness: Awake/alert Behavior During Therapy: WFL for tasks assessed/performed Overall Cognitive Status: Impaired/Different from baseline Area of Impairment: Orientation;Attention;Memory;Following commands;Safety/judgement;Awareness;Problem solving                 Orientation Level: Disoriented to;Time;Situation Current Attention Level: Sustained Memory: Decreased short-term memory Following Commands: Follows one step commands inconsistently;Follows one step commands with increased time Safety/Judgement: Decreased awareness of safety;Decreased awareness of deficits Awareness: Intellectual Problem Solving: Slow processing;Difficulty sequencing;Requires verbal cues;Decreased initiation;Requires tactile cues        Exercises General Exercises - Lower Extremity Long Arc Quad: AROM;Both;10 reps;Seated Other Exercises Other Exercises: 30 sec stretch and mobs to each ankle x 2 to increase dorsiflexion; PRAFOs donned  General Comments        Pertinent Vitals/Pain Pain Assessment: No/denies pain Faces Pain Scale: No hurt    Home Living                       Prior Function            PT Goals (current goals can now be found in the care plan section) Acute Rehab PT Goals Patient Stated Goal: none stated Time For Goal Achievement: 02/25/21 Potential to Achieve Goals: Fair Progress towards PT goals: Progressing toward goals    Frequency    Min 2X/week      PT Plan Current plan remains appropriate    Co-evaluation              AM-PAC PT "6 Clicks" Mobility   Outcome Measure  Help needed turning from your back to your side while in a flat bed without using bedrails?: A Little Help needed moving from lying on your back to sitting on the side of a flat bed without using bedrails?: A Lot Help needed moving to and from a bed to a chair (including a wheelchair)?: Total Help needed standing up from a chair using your arms (e.g., wheelchair or bedside chair)?: Total Help needed to walk in hospital room?: Total Help needed climbing 3-5 steps with a railing? : Total 6 Click Score: 9    End of Session   Activity Tolerance: Treatment limited secondary to medical complications (Comment) (decr cognition) Patient left: with call bell/phone within reach;in chair;with chair alarm set Nurse Communication: Mobility status;Need for lift equipment (stedy for back to bed) PT Visit Diagnosis: Muscle weakness (generalized) (M62.81);Repeated falls (R29.6);Difficulty in walking, not elsewhere classified (R26.2);Unsteadiness on feet (R26.81)     Time: 9811-9147 PT Time Calculation (min) (ACUTE ONLY): 30 min  Charges:  $Gait Training: 8-22 mins $Therapeutic Exercise: 8-22 mins                      Jerolyn Center, PT Pager [phone]    Zena Amos 02/20/2021, 1:54 PM

## 2021-02-20 NOTE — TOC Progression Note (Signed)
Transition of Care [facility]) - Progression Note    Patient Details  Name: Carolyn Norton MRN: [id number] Date of Birth: [birth year]  Transition of Care [facility] CM/SW Contact  Janae Bridgeman, RN Phone Number: 02/20/2021, 11:03 AM  Clinical Narrative:    Case management called and spoke with Loree Fee, Financial Counselor and she has been in contact with Medicaid office and family to obtain appropriate documents to assist with determination for Medicaid and disability at this time.  Dutch Quint states that the Medicaid application was submitted on 01/30/2021 and updated documents were sent on 02/10/2021 to the [facility] office as well.  The patient's DSS case worker is Mertha Baars at [phone] and financial counseling has been in touch with her to assist with processing of the application - pending Medicaid ID # [id number] Q.  I spoke with Gearldine Shown, CM at [facility] in [place] and she is willing to review the patient's clinicals and therapy notes at this time for possible bed offer - clinicals and therapy notes were sent in the hub to the facility.  CM will continue to follow the patient for transitions of care needs.   Expected Discharge Plan: Skilled Nursing Facility Barriers to Discharge: Family Issues, Continued Medical Work up  Expected Discharge Plan and Services Expected Discharge Plan: Skilled Nursing Facility In-house Referral: Clinical Social Work Discharge Planning Services: CM Consult Post Acute Care Choice: Skilled Nursing Facility Living arrangements for the past 2 months: Single Family Home Expected Discharge Date: 01/16/21               DME Arranged: [name] rolling DME Agency: [employer] Date DME Agency Contacted: 01/20/21 Time DME Agency Contacted: 1435 Representative spoke with at DME Agency: [name]             Social Determinants of Health (SDOH) Interventions    Readmission Risk Interventions Readmission Risk Prevention Plan  01/14/2021  Transportation Screening Complete  PCP or Specialist Appt within 3-5 Days (No Data)  Social Work Consult for Recovery Care Planning/Counseling Complete  Palliative Care Screening Not Applicable  Medication Review Oceanographer) Referral to Pharmacy  Some recent data might be hidden

## 2021-02-21 LAB — GLUCOSE, CAPILLARY
Glucose-Capillary: 155 mg/dL — ABNORMAL HIGH (ref 70–99)
Glucose-Capillary: 151 mg/dL — ABNORMAL HIGH (ref 70–99)
Glucose-Capillary: 193 mg/dL — ABNORMAL HIGH (ref 70–99)
Glucose-Capillary: 97 mg/dL (ref 70–99)
Glucose-Capillary: 139 mg/dL — ABNORMAL HIGH (ref 70–99)

## 2021-02-21 NOTE — TOC Progression Note (Signed)
Transition of Care [facility]) - Progression Note    Patient Details  Name: Balinda Heacock MRN: [id number] Date of Birth: 07/04/1966  Transition of Care [facility]) CM/SW Contact  Janae Bridgeman, RN Phone Number: 02/21/2021, 2:09 PM  Clinical Narrative:    Case management reached out to Selena Batten, CM with [facility] facilities to see if they are able to offer SNF placement for this patient.  Updated clinicals were placed in the hub for the facility to review at this time - including updated PT notes.  CM will continue to follow the patient for [facility] placement.   Expected Discharge Plan: Skilled Nursing Facility Barriers to Discharge: Family Issues, Continued Medical Work up  Expected Discharge Plan and Services Expected Discharge Plan: Skilled Nursing Facility In-house Referral: Clinical Social Work Discharge Planning Services: CM Consult Post Acute Care Choice: Skilled Nursing Facility Living arrangements for the past 2 months: Single Family Home Expected Discharge Date: 01/16/21               DME Arranged: [name] rolling DME Agency: [employer] Date DME Agency Contacted: 01/20/21 Time DME Agency Contacted: [phone] Representative spoke with at DME Agency: [name]             Social Determinants of Health (SDOH) Interventions    Readmission Risk Interventions Readmission Risk Prevention Plan 01/14/2021  Transportation Screening Complete  PCP or Specialist Appt within 3-5 Days (No Data)  Social Work Consult for Recovery Care Planning/Counseling Complete  Palliative Care Screening Not Applicable  Medication Review [facility]) Referral to Pharmacy  Some recent data might be hidden

## 2021-02-21 NOTE — Progress Notes (Signed)
[facility] PROGRESS NOTE  Carolyn Norton LXB:[id number] DOB: 07-25-1966 DOA: 01/11/2021 PCP: [name], FNP     Status: Remains inpatient appropriate because:Unsafe d/c plan-due to patient's underlying poor mental status short-term memory deficits she will require 24/7 care after discharge  Dispo:  Patient From:  Home  Planned Disposition: SNF-facility in [place] is reviewing patient's chart  Medically stable for discharge:  NO  Barriers to DC: Continues to require significant assistance with mobility.  Continues to have significant short-term memory deficits impeding patient's ability to safely return home with elderly parents; please note that the patient's father continues to work outside of the home therefore the burden of care would follow-up on the patient's elderly mother             Difficult to place: Yes  Level of care: Med-Surg  Code Status: DNR Family Communication: 4/10 Mother [name] updated DVT prophylaxis: Lovenox COVID vaccination status: Unknown   HPI: 55 year old female past medical history of diabetes mellitus type 2, bipolar disorder type I, previous COVID infection, polysubstance abuse that includes cocaine.  She presented from home where she lives with her parents.  Her presenting symptoms included generalized weakness, multiple falls and confusion.  Family confirmed that she had been having the symptoms for at least a month with last use of crack cocaine 1 month prior to presentation.  Ever since that time she had not been doing well.  She would also be having issues with hyperglycemia.  In the ER she was diagnosed with severe dehydration, hyperglycemia and metabolic encephalopathy.  Since admission patient has been evaluated by the psychiatric team who has deemed her alert and oriented and having capacity to make decisions.  Patient is agreeable to placement at a rehab.  Currently she has severe physical deconditioning which was POA and PT is  recommending SNF.  Because of her history of polysubstance abuse and lack of funding SNF is not an option.  Family is agreeable to taking her back in home when she is able to ambulate well enough to get back and forth for meals into the bathroom.  Subjective: Awakened from sleep.  Note bed saturated with urine.  Patient unaware that she had been incontinent of urine.  Pleasant but confused.  Objective: Vitals:   02/20/21 2100 02/21/21 0326  BP: 128/81 (!) 148/71  Pulse: 83 81  Resp: 17 18  Temp: 97.8 F (36.6 C) 97.9 F (36.6 C)  SpO2: 100% 95%    Intake/Output Summary (Last 24 hours) at 02/21/2021 0747 Last data filed at 02/20/2021 2100 Gross per 24 hour  Intake --  Output 300 ml  Net -300 ml   Filed Weights   01/11/21 1152  Weight: 99.8 kg    Exam:  Constitutional: Calm and in no acute distress Respiratory: Lungs remain clear, room air, normal pulse oximetry Cardiac: S1 S2, regular pulse, normotensive, no peripheral edema, Abdomen: LBM 6/16, appetite documented is fair.  Patient continues to report poor appetite.  Abdominal exam unremarkable with normoactive bowel sounds. Musculoskeletal: Patient noted with bilateral heel cord tightness with knees extended during recent PT evaluation Neurologic: CN 2-12 grossly intact. Sensation intact, Strength 4/5 x all 4 extremities.   Psychiatric: Awakened.  Oriented times name and year only.  To place (stated she was at home) and not oriented to situation think she was unable to recall what happened to her to cause her to be admitted to the hospital.  Unaware she had been incontinent of urine.  She self-reports  she is unable to tell when she needs to urinate.    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 NPH Significantly abnormal cognitive screening tests (short Blessed test and SLUMS)  Neurology and neurosurgery have signed off. S/p large volume LP Per neurology and neurosurgery imaging is not a "slam dunk" to definitely confirm the diagnosis of NPH  Patient will need to follow-up with neurosurgery after discharge. **Neurologicaleam/neurosurgeon has documented that NPH is not emergent and does not need a LD trial before placement to SNF.  She can be followed up in the outpatient setting after discharge if a bed is available before can arrange for an LD trial**  Anorexia/constipation Persistent issue Patient encouraged to at least drink protein beverages if she feels solid diet is not unpalatable  Abnormal urinalysis/UTI Patient had significant hyperglycemia with resultant UA consistent with UTI Urine culture ordered but was never obtained Completed Rocephin x 3 days  Diabetes mellitus 2 with hyperglycemia on insulin Hemoglobin A1c 11.0 No further hypoglycemia after 70/30 insulin decreased to 2 units twice daily Variable intake persists Continue SSI  Dyslipidemia Continue statin  History of bipolar 1 disorder/polysubstance abuse/cognitive impairment Continue escitalopram, Neurontin and Depakote Functional cognitive evaluation with significant abnormalities  Physical deconditioning/bilateral heel cord shortening Continue bilateral PRAFO boots on every 4 hours and off every 4 hours SNF at discharge   Other problems: Acute kidney injury on CKD 3a Acute kidney injury resolved after IV  fluids Baseline creatinine 1.3  COPD Continue Singulair, nebs as needed, consider adding LABA  GERD Continue PPI  Hypokalemia/hypomagnesemia Resolved  Data Reviewed: Basic Metabolic Panel: Recent Labs  Lab 02/14/21 0908  NA 139  K 4.5  CL 103  CO2 27  GLUCOSE 137*  BUN 13  CREATININE 0.98  CALCIUM 9.1   Liver Function Tests: No results for input(s): AST, ALT, ALKPHOS, BILITOT, PROT, ALBUMIN in the last 168 hours. No results for input(s): LIPASE, AMYLASE in the last 168 hours. No results for input(s): AMMONIA in the last 168 hours. CBC: Recent Labs  Lab 02/14/21 0908  WBC 7.7  HGB 11.3*  HCT 34.8*  MCV 90.6  PLT 159   Cardiac Enzymes: No results for input(s): CKTOTAL, CKMB, CKMBINDEX, TROPONINI in the last 168 hours. BNP (last 3 results) Recent Labs    01/14/21 0238 01/15/21 0230 01/16/21 0201  BNP 177.8* 115.9* 40.9    ProBNP (last 3 results) No results for input(s): PROBNP in the last 8760 hours.  CBG: Recent Labs  Lab 02/20/21 1148 02/20/21 1614 02/20/21 2053 02/20/21 2344 02/21/21 0324  GLUCAP 203* 227* 240* 115* 155*    Recent Results (from the past 240 hour(s))  Culture, blood (routine x 2)     Status: None   Collection Time: 02/13/21  8:55 AM   Specimen: BLOOD  Result Value Ref Range Status   Specimen Description BLOOD RIGHT ANTECUBITAL  Final   Special Requests   Final    BOTTLES DRAWN AEROBIC AND ANAEROBIC Blood Culture results may not be optimal due to an excessive  volume of blood received in culture bottles   Culture   Final    NO GROWTH 5 DAYS Performed at [facility], [address], [place], [place] [address]    Report Status 02/18/2021 FINAL  Final  Culture, blood (routine x 2)     Status: None   Collection Time: 02/13/21  9:03 AM   Specimen: BLOOD  Result Value Ref Range Status   Specimen Description BLOOD RIGHT ANTECUBITAL  Final   Special Requests   Final    BOTTLES DRAWN AEROBIC AND ANAEROBIC Blood Culture  adequate volume   Culture   Final    NO GROWTH 5 DAYS Performed at [facility], [address], [place], [place] [address]    Report Status 02/18/2021 FINAL  Final     Studies: No results found.  Scheduled Meds:  atorvastatin  20 mg Oral Daily   cholecalciferol  1,000 Units Oral Daily   divalproex  500 mg Oral BID   escitalopram  20 mg Oral Daily   feeding supplement (GLUCERNA SHAKE)  237 mL Oral TID AC & HS   gabapentin  300 mg Oral BID   insulin aspart  0-9 Units Subcutaneous Q4H   insulin aspart protamine- aspart  2 Units Subcutaneous Q supper   insulin aspart protamine- aspart  6 Units Subcutaneous Q breakfast   metFORMIN  1,000 mg Oral BID WC   montelukast  10 mg Oral QHS   pantoprazole  40 mg Oral Daily   senna-docusate  2 tablet Oral Daily   thiamine  100 mg Oral Daily   Continuous Infusions:  dextrose 5 % and 0.9% NaCl 50 mL/hr at 02/21/21 0407   vancomycin      Principal Problem:   Bipolar 1 disorder, depressed, full remission (HCC) Active Problems:   Bipolar 1 disorder (HCC)   Hypertension   Generalized weakness   Controlled type 2 diabetes mellitus with hyperglycemia, with long-term current use of insulin (HCC)   Hyperlipidemia   NPH (normal pressure hydrocephalus) (HCC)   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   Consultants: Psychiatry  Procedures: None  Antibiotics: Anti-infectives (From admission, onward)    Start     Dose/Rate Route Frequency Ordered Stop   02/14/21 0828  vancomycin (VANCOREADY) IVPB 1000 mg/200 mL        1,000 mg 200 mL/hr over 60 Minutes Intravenous 60 min pre-op 02/14/21 0828     02/13/21 2115  cefTRIAXone (ROCEPHIN) 1 g in sodium chloride 0.9 % 100 mL IVPB  Status:  Discontinued        1 g 200 mL/hr over 30 Minutes Intravenous Every 24 hours 02/13/21 2016 02/16/21 0844        Time spent: 25 minutes    [name] ANP  [facility] 7 am - 330 pm/M-F for direct patient care and secure chat Please refer  to Amion for contact info 40  days

## 2021-02-22 LAB — GLUCOSE, CAPILLARY
Glucose-Capillary: 88 mg/dL (ref 70–99)
Glucose-Capillary: 196 mg/dL — ABNORMAL HIGH (ref 70–99)
Glucose-Capillary: 219 mg/dL — ABNORMAL HIGH (ref 70–99)
Glucose-Capillary: 105 mg/dL — ABNORMAL HIGH (ref 70–99)
Glucose-Capillary: 169 mg/dL — ABNORMAL HIGH (ref 70–99)
Glucose-Capillary: 155 mg/dL — ABNORMAL HIGH (ref 70–99)

## 2021-02-22 NOTE — Progress Notes (Signed)
Patient said she had a hard time swallowing. I made nurse aware she told me to try giving her water that she might have a pill stuck. Patient said she was still having a hard time swallowing.

## 2021-02-22 NOTE — Progress Notes (Signed)
PROGRESS NOTE    Carolyn Norton  HAL:[id number] DOB: August 25, 1966 DOA: 01/11/2021 PCP: [name], FNP    Brief Narrative:  55 year old female past medical history of diabetes mellitus type 2, bipolar disorder type I, previous COVID infection, polysubstance abuse that includes cocaine.  She presented from home where she lives with her parents.  Her presenting symptoms included generalized weakness, multiple falls and confusion.  Family confirmed that she had been having the symptoms for at least a month with last use of crack cocaine 1 month prior to presentation.  Ever since that time she had not been doing well.  She would also be having issues with hyperglycemia.  In the ER she was diagnosed with severe dehydration, hyperglycemia and metabolic encephalopathy.   Since admission patient has been evaluated by the psychiatric team who has deemed her alert and oriented and having capacity to make decisions.  Patient is agreeable to placement at a rehab.  Currently she has severe physical deconditioning which was POA and PT is recommending SNF.  Because of her history of polysubstance abuse and lack of funding SNF is not an option.  Family is agreeable to taking her back in home when she is able to ambulate well enough to get back and forth for meals into the bathroom.    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 NPH Significantly abnormal cognitive screening tests (short Blessed test and SLUMS)  Neurology and neurosurgery have signed off. S/p large volume LP Per neurology and neurosurgery imaging is not a "slam dunk" to definitely confirm the diagnosis of NPH Patient will need to follow-up with neurosurgery after discharge. **Neurologicaleam/neurosurgeon has documented that NPH is not emergent and does not need a LD trial before placement to SNF.  She can be followed up in the outpatient setting after discharge if a bed is available before can arrange for an LD trial**   Anorexia/constipation Persistent issue Patient encouraged to at least drink protein beverages if she feels solid diet is not unpalatable   Abnormal urinalysis/UTI Patient had significant hyperglycemia with resultant UA consistent with UTI Urine culture ordered but was never obtained Completed Rocephin x 3 days   Diabetes mellitus 2 with hyperglycemia on insulin Hemoglobin A1c 11.0 No further hypoglycemia after 70/30 insulin decreased to 2 units twice daily Variable intake persists Continue SSI -Glucose trends are stable this AM  Dyslipidemia Continue statin   History of bipolar 1 disorder/polysubstance abuse/cognitive impairment Continue escitalopram, Neurontin and Depakote Functional cognitive evaluation with significant abnormalities   Physical deconditioning/bilateral heel cord shortening Continue bilateral PRAFO boots on every 4 hours and off every 4 hours SNF at  discharge     Other  problems: Acute kidney injury on CKD 3a Acute kidney injury resolved after IV fluids Baseline creatinine 1.3   COPD Continue Singulair, nebs as needed, consider adding LABA   GERD Continue PPI   Hypokalemia/hypomagnesemia Resolved    DVT prophylaxis: SCD's Code Status: DNR Family Communication: Pt in room, family not at bedside  Status is: Inpatient  Remains inpatient appropriate because:Inpatient level of care appropriate due to severity of illness  Dispo:  Patient From:    Planned Disposition: Skilled Nursing Facility  Medically stable for discharge:           Consultants:  Psychiatry Neurology Neurosurgery  Procedures:    Antimicrobials: Anti-infectives (From admission, onward)    Start     Dose/Rate Route Frequency Ordered Stop   02/14/21 0828  vancomycin (VANCOREADY) IVPB 1000 mg/200 mL        1,000 mg 200 mL/hr over 60 Minutes Intravenous 60 min pre-op 02/14/21 0828     02/13/21 2115  cefTRIAXone (ROCEPHIN) 1 g in sodium chloride 0.9 % 100 mL IVPB  Status:  Discontinued        1 g 200 mL/hr over 30 Minutes Intravenous Every 24 hours 02/13/21 2016 02/16/21 0844       Subjective: Without complaints, pleasant  Objective: Vitals:   02/21/21 1443 02/21/21 2123 02/22/21 0521 02/22/21 1351  BP: 123/69 127/69 139/70 120/67  Pulse: 72 74 71 83  Resp: 16 15 12 17   Temp: 98.4 F (36.9 C) 98.3 F (36.8 C) 97.7 F (36.5 C) 98.2 F (36.8 C)  TempSrc: Oral Axillary Oral   SpO2: 95% 91% 96% 98%  Weight:      Height:        Intake/Output Summary (Last 24 hours) at 02/22/2021 1729 Last data filed at 02/22/2021 1500 Gross per 24 hour  Intake 2919.46 ml  Output --  Net 2919.46 ml   Filed Weights   01/11/21 1152  Weight: 99.8 kg    Examination: General exam: Awake, laying in bed, in nad Respiratory system: Normal respiratory effort, no wheezing  Data Reviewed: I have personally reviewed following labs and imaging studies  CBC: No results for  input(s): WBC, NEUTROABS, HGB, HCT, MCV, PLT in the last 168 hours. Basic Metabolic Panel: No results for input(s): NA, K, CL, CO2, GLUCOSE, BUN, CREATININE, CALCIUM, MG, PHOS in the last 168 hours. GFR: Estimated Creatinine Clearance: 79.1 mL/min (by C-G formula based on SCr of 0.98 mg/dL). Liver Function Tests: No results for input(s): AST, ALT, ALKPHOS, BILITOT, PROT, ALBUMIN in the last 168 hours. No results for input(s): LIPASE, AMYLASE in the last 168 hours. No results for input(s): AMMONIA in the last 168 hours. Coagulation Profile: No results for input(s): INR, PROTIME in the last 168 hours. Cardiac Enzymes: No results for input(s): CKTOTAL, CKMB, CKMBINDEX, TROPONINI in the last 168 hours. BNP (last 3 results) No results for input(s): PROBNP in the last 8760 hours. HbA1C: No results for input(s): HGBA1C in the last 72 hours. CBG: Recent Labs  Lab 02/21/21 1957 02/22/21 0327 02/22/21 0755 02/22/21 1209 02/22/21 1700  GLUCAP 139* 88 196* 219* 105*   Lipid Profile: No results for input(s): CHOL, HDL, LDLCALC, TRIG, CHOLHDL, LDLDIRECT in the last 72 hours. Thyroid Function Tests: No results for input(s): TSH, T4TOTAL, FREET4, T3FREE, THYROIDAB in the last 72 hours. Anemia Panel: No results for input(s): VITAMINB12, FOLATE, FERRITIN, TIBC, IRON, RETICCTPCT in the last 72 hours. Sepsis Labs: No results for input(s): PROCALCITON, LATICACIDVEN  in the last 168 hours.  Recent Results (from the past 240 hour(s))  Culture, blood (routine x 2)     Status: None   Collection Time: 02/13/21  8:55 AM   Specimen: BLOOD  Result Value Ref Range Status   Specimen Description BLOOD RIGHT ANTECUBITAL  Final   Special Requests   Final    BOTTLES DRAWN AEROBIC AND ANAEROBIC Blood Culture results may not be optimal due to an excessive volume of blood received in culture bottles   Culture   Final    NO GROWTH 5 DAYS Performed at [facility], [address] [place], [place]  [address]    Report Status 02/18/2021 FINAL  Final  Culture, blood (routine x 2)     Status: None   Collection Time: 02/13/21  9:03 AM   Specimen: BLOOD  Result Value Ref Range Status   Specimen Description BLOOD RIGHT ANTECUBITAL  Final   Special Requests   Final    BOTTLES DRAWN AEROBIC AND ANAEROBIC Blood Culture adequate volume   Culture   Final    NO GROWTH 5 DAYS Performed at [facility], [address], [place], [place] [address]    Report Status 02/18/2021 FINAL  Final     Radiology Studies: No results found.  Scheduled Meds:  atorvastatin  20 mg Oral Daily   cholecalciferol  1,000 Units Oral Daily   divalproex  500 mg Oral BID   escitalopram  20 mg Oral Daily   feeding supplement (GLUCERNA SHAKE)  237 mL Oral TID AC & HS   gabapentin  300 mg Oral BID   insulin aspart  0-9 Units Subcutaneous Q4H   insulin aspart protamine- aspart  2 Units Subcutaneous Q supper   insulin aspart protamine- aspart  6 Units Subcutaneous Q breakfast   metFORMIN  1,000 mg Oral BID WC   montelukast  10 mg Oral QHS   pantoprazole  40 mg Oral Daily   senna-docusate  2 tablet Oral Daily   thiamine  100 mg Oral Daily   Continuous Infusions:  vancomycin       LOS: 41 days   Rickey Barbara, MD [facility] Pager On Amion  If 7PM-7AM, please contact night-coverage 02/22/2021, 5:29 PM

## 2021-02-23 LAB — GLUCOSE, CAPILLARY
Glucose-Capillary: 180 mg/dL — ABNORMAL HIGH (ref 70–99)
Glucose-Capillary: 131 mg/dL — ABNORMAL HIGH (ref 70–99)
Glucose-Capillary: 126 mg/dL — ABNORMAL HIGH (ref 70–99)
Glucose-Capillary: 105 mg/dL — ABNORMAL HIGH (ref 70–99)
Glucose-Capillary: 147 mg/dL — ABNORMAL HIGH (ref 70–99)

## 2021-02-23 NOTE — Plan of Care (Signed)
  Problem: Clinical Measurements: Goal: Ability to maintain clinical measurements within normal limits will improve Outcome: Progressing Goal: Will remain free from infection Outcome: Progressing Goal: Diagnostic test results will improve Outcome: Progressing   Problem: Safety: Goal: Ability to remain free from injury will improve Outcome: Progressing   Problem: Skin Integrity: Goal: Risk for impaired skin integrity will decrease Outcome: Progressing   

## 2021-02-23 NOTE — Progress Notes (Signed)
PROGRESS NOTE    Carolyn Norton  NOM:[id number] DOB: 23-Feb-1966 DOA: 01/11/2021 PCP: [name], FNP    Brief Narrative:  55 year old female past medical history of diabetes mellitus type 2, bipolar disorder type I, previous COVID infection, polysubstance abuse that includes cocaine.  She presented from home where she lives with her parents.  Her presenting symptoms included generalized weakness, multiple falls and confusion.  Family confirmed that she had been having the symptoms for at least a month with last use of crack cocaine 1 month prior to presentation.  Ever since that time she had not been doing well.  She would also be having issues with hyperglycemia.  In the ER she was diagnosed with severe dehydration, hyperglycemia and metabolic encephalopathy.   Since admission patient has been evaluated by the psychiatric team who has deemed her alert and oriented and having capacity to make decisions.  Patient is agreeable to placement at a rehab.  Currently she has severe physical deconditioning which was POA and PT is recommending SNF.  Because of her history of polysubstance abuse and lack of funding SNF is not an option.  Family is agreeable to taking her back in home when she is able to ambulate well enough to get back and forth for meals into the bathroom.    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 NPH Significantly abnormal cognitive screening tests (short Blessed test and SLUMS)  Neurology and neurosurgery have signed off. S/p large volume LP Per neurology and neurosurgery imaging is not a "slam dunk" to definitely confirm the diagnosis of NPH Patient will need to follow-up with neurosurgery after discharge. **Neurologicaleam/neurosurgeon has documented that NPH is not emergent and does not need a LD trial before placement to SNF.  She can be followed up in the outpatient setting after discharge if a bed is available before can arrange for an LD trial**   Anorexia/constipation Persistent issue Patient encouraged to at least drink protein beverages if she feels solid diet is not unpalatable   Abnormal urinalysis/UTI Patient had significant hyperglycemia with resultant UA consistent with UTI Urine culture ordered but was never obtained Completed Rocephin x 3 days   Diabetes mellitus 2 with hyperglycemia on insulin Hemoglobin A1c 11.0 No further hypoglycemia after 70/30 insulin decreased to 2 units twice daily Variable intake persists Continue SSI -Reviewed glucose trends. Remains stable and well controlled today  Dyslipidemia Continue statin   History of bipolar 1 disorder/polysubstance abuse/cognitive impairment Continue escitalopram, Neurontin and Depakote Functional cognitive evaluation with significant abnormalities   Physical deconditioning/bilateral heel cord shortening Continue bilateral PRAFO boots on every 4 hours and off every 4  hours SNF at  discharge     Other problems: Acute kidney injury on CKD 3a Acute kidney injury resolved after IV fluids Baseline creatinine 1.3   COPD Continue Singulair, nebs as needed, consider adding LABA   GERD Continue PPI   Hypokalemia/hypomagnesemia Resolved    DVT prophylaxis: SCD's Code Status: DNR Family Communication: Pt in room, family not at bedside  Status is: Inpatient  Remains inpatient appropriate because:Inpatient level of care appropriate due to severity of illness  Dispo:  Patient From:    Planned Disposition: Skilled Nursing Facility  Medically stable for discharge:           Consultants:  Psychiatry Neurology Neurosurgery  Procedures:    Antimicrobials: Anti-infectives (From admission, onward)    Start     Dose/Rate Route Frequency Ordered Stop   02/14/21 0828  vancomycin (VANCOREADY) IVPB 1000 mg/200 mL        1,000 mg 200 mL/hr over 60 Minutes Intravenous 60 min pre-op 02/14/21 0828     02/13/21 2115  cefTRIAXone (ROCEPHIN) 1 g in sodium chloride 0.9 % 100 mL IVPB  Status:  Discontinued        1 g 200 mL/hr over 30 Minutes Intravenous Every 24 hours 02/13/21 2016 02/16/21 0844       Subjective: Without issues  Objective: Vitals:   02/22/21 0521 02/22/21 1351 02/22/21 2125 02/23/21 0551  BP: 139/70 120/67 129/74 (!) 144/66  Pulse: 71 83 82 87  Resp: 12 17 15    Temp: 97.7 F (36.5 C) 98.2 F (36.8 C) 98.2 F (36.8 C) (!) 97.5 F (36.4 C)  TempSrc: Oral  Oral   SpO2: 96% 98% 96% 99%  Weight:      Height:        Intake/Output Summary (Last 24 hours) at 02/23/2021 1551 Last data filed at 02/23/2021 0553 Gross per 24 hour  Intake --  Output 400 ml  Net -400 ml    Filed Weights   01/11/21 1152  Weight: 99.8 kg    Examination: General exam: Laying in bed, in no acute distress Respiratory system: normal chest rise, clear, no audible wheezing  Data Reviewed: I have personally reviewed following labs and imaging  studies  CBC: No results for input(s): WBC, NEUTROABS, HGB, HCT, MCV, PLT in the last 168 hours. Basic Metabolic Panel: No results for input(s): NA, K, CL, CO2, GLUCOSE, BUN, CREATININE, CALCIUM, MG, PHOS in the last 168 hours. GFR: Estimated Creatinine Clearance: 79.1 mL/min (by C-G formula based on SCr of 0.98 mg/dL). Liver Function Tests: No results for input(s): AST, ALT, ALKPHOS, BILITOT, PROT, ALBUMIN in the last 168 hours. No results for input(s): LIPASE, AMYLASE in the last 168 hours. No results for input(s): AMMONIA in the last 168 hours. Coagulation Profile: No results for input(s): INR, PROTIME in the last 168 hours. Cardiac Enzymes: No results for input(s): CKTOTAL, CKMB, CKMBINDEX, TROPONINI in the last 168 hours. BNP (last 3 results) No results for input(s): PROBNP in the last 8760 hours. HbA1C: No results for input(s): HGBA1C in the last 72 hours. CBG: Recent Labs  Lab 02/22/21 1928 02/22/21 2324 02/23/21 0401 02/23/21 0732 02/23/21 1131  GLUCAP 169* 155* 180* 131* 126*    Lipid Profile: No results for input(s): CHOL, HDL, LDLCALC, TRIG, CHOLHDL, LDLDIRECT in the last 72 hours. Thyroid Function Tests: No results for input(s): TSH, T4TOTAL, FREET4, T3FREE, THYROIDAB in the last 72 hours. Anemia Panel: No results for input(s): VITAMINB12, FOLATE, FERRITIN, TIBC, IRON, RETICCTPCT in the last 72  hours. Sepsis Labs: No results for input(s): PROCALCITON, LATICACIDVEN in the last 168 hours.  No results found for this or any previous visit (from the past 240 hour(s)).    Radiology Studies: No results found.  Scheduled Meds:  atorvastatin  20 mg Oral Daily   cholecalciferol  1,000 Units Oral Daily   divalproex  500 mg Oral BID   escitalopram  20 mg Oral Daily   feeding supplement (GLUCERNA SHAKE)  237 mL Oral TID AC & HS   gabapentin  300 mg Oral BID   insulin aspart  0-9 Units Subcutaneous Q4H   insulin aspart protamine- aspart  2 Units Subcutaneous Q  supper   insulin aspart protamine- aspart  6 Units Subcutaneous Q breakfast   metFORMIN  1,000 mg Oral BID WC   montelukast  10 mg Oral QHS   pantoprazole  40 mg Oral Daily   senna-docusate  2 tablet Oral Daily   thiamine  100 mg Oral Daily   Continuous Infusions:  vancomycin       LOS: 42 days   Rickey Barbara, MD [facility] Pager On Amion  If 7PM-7AM, please contact night-coverage 02/23/2021, 3:51 PM

## 2021-02-24 LAB — COMPREHENSIVE METABOLIC PANEL
ALT: 14 U/L (ref 0–44)
AST: 15 U/L (ref 15–41)
Albumin: 2.5 g/dL — ABNORMAL LOW (ref 3.5–5.0)
Alkaline Phosphatase: 46 U/L (ref 38–126)
Anion gap: 9 (ref 5–15)
BUN: 8 mg/dL (ref 6–20)
CO2: 32 mmol/L (ref 22–32)
Calcium: 9.3 mg/dL (ref 8.9–10.3)
Chloride: 97 mmol/L — ABNORMAL LOW (ref 98–111)
Creatinine, Ser: 0.95 mg/dL (ref 0.44–1.00)
GFR, Estimated: >60 mL/min (ref >60)
Glucose, Bld: 249 mg/dL — ABNORMAL HIGH (ref 70–99)
Potassium: 3.8 mmol/L (ref 3.5–5.1)
Sodium: 138 mmol/L (ref 135–145)
Total Bilirubin: 0.3 mg/dL (ref 0.3–1.2)
Total Protein: 5.2 g/dL — ABNORMAL LOW (ref 6.5–8.1)

## 2021-02-24 LAB — CBC
HCT: 34 % — ABNORMAL LOW (ref 36.0–46.0)
Hemoglobin: 11.2 g/dL — ABNORMAL LOW (ref 12.0–15.0)
MCH: 29.4 pg (ref 26.0–34.0)
MCHC: 32.9 g/dL (ref 30.0–36.0)
MCV: 89.2 fL (ref 80.0–100.0)
Platelets: 195 10*3/uL (ref 150–400)
RBC: 3.81 MIL/uL — ABNORMAL LOW (ref 3.87–5.11)
RDW: 15.5 % (ref 11.5–15.5)
WBC: 8 10*3/uL (ref 4.0–10.5)
nRBC: 0 % (ref 0.0–0.2)

## 2021-02-24 LAB — GLUCOSE, CAPILLARY
Glucose-Capillary: 110 mg/dL — ABNORMAL HIGH (ref 70–99)
Glucose-Capillary: 127 mg/dL — ABNORMAL HIGH (ref 70–99)
Glucose-Capillary: 141 mg/dL — ABNORMAL HIGH (ref 70–99)
Glucose-Capillary: 167 mg/dL — ABNORMAL HIGH (ref 70–99)
Glucose-Capillary: 211 mg/dL — ABNORMAL HIGH (ref 70–99)
Glucose-Capillary: 218 mg/dL — ABNORMAL HIGH (ref 70–99)
Glucose-Capillary: 223 mg/dL — ABNORMAL HIGH (ref 70–99)
Glucose-Capillary: 52 mg/dL — ABNORMAL LOW (ref 70–99)

## 2021-02-24 NOTE — Progress Notes (Signed)
Physical Therapy Treatment Patient Details Name: Carolyn Norton MRN: [id number] DOB: 18-Mar-1966 Today's Date: 02/24/2021    History of Present Illness Pt is a 55 y.o. female admitted 01/11/21 with multiple falls, AMS, weakness. Workup for severe dehydration, DKA, metabolic encephalopathy. 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 Neurosurgery following for possible lumbar drain trial vs repeat LP vs VP shunt placement. Placement of lumbar drain on 6/10 cancelled due to UTI. PMH includes DM2, COPD, CKD3, GERD, bipolar disorder, COVID-19, polysubstance abuse including cocaine.    PT Comments    Patient easily awakened on arrival. Overall, slower cognitively this date (more limited ability to follow commands) and physically more stiff/rigid throughout. Session focused on PROM stretching after unable to assist pt to sit EOB with 1 person assist. Rolling practiced after stretching with no improvement in amount of assist required.    Follow Up Recommendations  SNF (family wants to take pt home but cannot at current functional level)     Equipment Recommendations  Rolling walker with 5" wheels;3in1 (PT);Hospital bed;Wheelchair (measurements PT);Other (comment) (hoyer lift; *reassess after lumbar drain)    Recommendations for Other Services       Precautions / Restrictions Precautions Precautions: Fall    Mobility  Bed Mobility Overal bed mobility: Needs Assistance Bed Mobility: Rolling;Sidelying to Sit Rolling: Mod assist (with rail) Sidelying to sit: HOB elevated;Total assist       General bed mobility comments: rolling rt and left x2 for practice with no carryover demonstrated; unable to achieve sitting EOB with 1 person assist (was able 6/16) ?if pt has not attempted EOB since that time/day    Transfers                    Ambulation/Gait                 Stairs             Wheelchair Mobility    Modified Rankin (Stroke  Patients Only)       Balance Overall balance assessment: Needs assistance;History of Falls Sitting-balance support: Feet supported;Bilateral upper extremity supported Sitting balance-Leahy Scale: Zero Sitting balance - Comments: unable to sit EOB with 1 person assist Postural control: Posterior lean                                  Cognition Arousal/Alertness: Awake/alert Behavior During Therapy: WFL for tasks assessed/performed Overall Cognitive Status: Impaired/Different from baseline Area of Impairment: Attention;Memory;Following commands;Safety/judgement;Awareness;Problem solving                 Orientation Level:  (NT) Current Attention Level: Sustained;Focused Memory: Decreased short-term memory (repeated cues) Following Commands: Follows one step commands inconsistently;Follows one step commands with increased time Safety/Judgement: Decreased awareness of safety;Decreased awareness of deficits Awareness: Intellectual Problem Solving: Slow processing;Difficulty sequencing;Requires verbal cues;Decreased initiation;Requires tactile cues General Comments: poor following commands with basic bed mobility this date; required hand-over-hand guidance for rolling and unsuccessful attempt to sit EOB      Exercises Other Exercises Other Exercises: after initial attempt to roll and side to sit, noted pt with significant overall stiffness. Returned to supine and in hooklying performed lower body rotation x 30 seconds x 3 reps to each side (followed by rolling rt and lt with no significant improvement noted after stretching). Patient with delayed processing and bradykinesia which prevents her from using any momentum    General Comments  Pertinent Vitals/Pain Pain Assessment: Faces Faces Pain Scale: No hurt    Home Living                      Prior Function            PT Goals (current goals can now be found in the care plan section) Acute  Rehab PT Goals Patient Stated Goal: none stated PT Goal Formulation: Patient unable to participate in goal setting Time For Goal Achievement: 03/10/21 Potential to Achieve Goals: Fair Progress towards PT goals: Not progressing toward goals - comment;Goals downgraded-see care plan (decr cognition; only 1 person assist)    Frequency    Min 2X/week      PT Plan Current plan remains appropriate    Co-evaluation              AM-PAC PT "6 Clicks" Mobility   Outcome Measure  Help needed turning from your back to your side while in a flat bed without using bedrails?: A Lot Help needed moving from lying on your back to sitting on the side of a flat bed without using bedrails?: Total Help needed moving to and from a bed to a chair (including a wheelchair)?: Total Help needed standing up from a chair using your arms (e.g., wheelchair or bedside chair)?: Total Help needed to walk in hospital room?: Total Help needed climbing 3-5 steps with a railing? : Total 6 Click Score: 7    End of Session Equipment Utilized During Treatment: Gait belt Activity Tolerance: Treatment limited secondary to medical complications (Comment) (decr cognition) Patient left: in bed;with bed alarm set;with call bell/phone within reach Nurse Communication: Mobility status;Need for lift equipment PT Visit Diagnosis: Muscle weakness (generalized) (M62.81);Repeated falls (R29.6);Difficulty in walking, not elsewhere classified (R26.2);Unsteadiness on feet (R26.81)     Time: 9323-5573 PT Time Calculation (min) (ACUTE ONLY): 21 min  Charges:  $Therapeutic Activity: 8-22 mins                      Jerolyn Center, PT Pager [phone]    Zena Amos 02/24/2021, 4:04 PM

## 2021-02-24 NOTE — Progress Notes (Signed)
[facility] PROGRESS NOTE  Carolyn Norton RSW:[id number] DOB: 1966/08/18 DOA: 01/11/2021 PCP: [name], FNP     Status: Remains inpatient appropriate because:Unsafe d/c plan-due to patient's underlying poor mental status short-term memory deficits she will require 24/7 care after discharge  Dispo:  Patient From:  Home  Planned Disposition: SNF-facility in [place] is reviewing patient's chart  Medically stable for discharge:  NO  Barriers to DC: Continues to require significant assistance with mobility.  Continues to have significant short-term memory deficits impeding patient's ability to safely return home with elderly parents; please note that the patient's father continues to work outside of the home therefore the burden of care would follow-up on the patient's elderly mother             Difficult to place: Yes  Level of care: Med-Surg  Code Status: DNR Family Communication: 72/10 Mother [name] updated DVT prophylaxis: Lovenox COVID vaccination status: Unknown   HPI: 55 year old female past medical history of diabetes mellitus type 2, bipolar disorder type I, previous COVID infection, polysubstance abuse that includes cocaine.  She presented from home where she lives with her parents.  Her presenting symptoms included generalized weakness, multiple falls and confusion.  Family confirmed that she had been having the symptoms for at least a month with last use of crack cocaine 1 month prior to presentation.  Ever since that time she had not been doing well.  She would also be having issues with hyperglycemia.  In the ER she was diagnosed with severe dehydration, hyperglycemia and metabolic encephalopathy.  Since admission patient has been evaluated by the psychiatric team who has deemed her alert and oriented and having capacity to make decisions.  Patient is agreeable to placement at a rehab.  Currently she has severe physical deconditioning which was POA and PT is  recommending SNF.  Because of her history of polysubstance abuse and lack of funding SNF is not an option.  Family is agreeable to taking her back in home when she is able to ambulate well enough to get back and forth for meals into the bathroom.  Subjective: Sleeping soundly and did not awaken during exam  Objective: Vitals:   02/23/21 1655 02/23/21 2103  BP: 133/76 130/72  Pulse: 85 82  Resp: 20 19  Temp: 98.5 F (36.9 C) 98 F (36.7 C)  SpO2: 95% 93%    Intake/Output Summary (Last 24 hours) at 02/24/2021 0749 Last data filed at 02/23/2021 1849 Gross per 24 hour  Intake --  Output 1 ml  Net -1 ml   Filed Weights   01/11/21 1152  Weight: 99.8 kg    Exam:  Constitutional: Appears comfortable, sleeping soundly Respiratory: Anterior lung sounds clear, room air Cardiac: Normal heart sounds, no peripheral edema.  Regular nontachycardic pulse Abdomen: LBM 6/19, soft nontender nondistended.  Continues with poor oral intake Musculoskeletal: Patient noted with bilateral heel cord tightness with knees extended during recent PT evaluation Neurologic: Sleeping soundly but at baseline CN 2-12 grossly intact. Sensation intact, Strength 4/5 x all 4 extremities.   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 NPH Significantly abnormal cognitive  screening tests (short Blessed test and SLUMS)  Neurology and neurosurgery have signed off. S/p large volume LP Per neurology and neurosurgery imaging is not a "slam dunk" to definitely confirm the diagnosis of NPH  Patient will need to follow-up with neurosurgery after discharge. **Neurologicaleam/neurosurgeon has documented that NPH is not emergent and does not need a LD trial before placement to SNF.  She can be followed up in the outpatient setting after discharge if a bed is available before can arrange for an LD trial**  Anorexia/constipation Ongoing issue Protein beverages ordered  Abnormal urinalysis/UTI UA consistent with UTI Urine culture ordered but was never obtained Completed Rocephin x 3 days  Diabetes mellitus 2 with hyperglycemia on insulin Hemoglobin A1c 11.0 No further hypoglycemia after 70/30 insulin decreased to 2 units twice daily and CBGs adequately controlled with peak reading in the past 24 hours 223 Variable intake persists Continue SSI  Dyslipidemia Continue statin  History of bipolar 1 disorder/polysubstance abuse/cognitive impairment Continue escitalopram, Neurontin and Depakote Functional cognitive evaluation with significant abnormalities  Physical deconditioning/bilateral heel cord shortening Continue bilateral PRAFO boots on every 4 hours and off every 4 hours SNF at discharge   Other problems: Acute kidney injury on CKD 3a Acute kidney injury resolved after IV fluids Baseline creatinine 1.3  COPD Continue Singulair, nebs as needed, consider adding LABA  GERD Continue PPI  Hypokalemia/hypomagnesemia Resolved  Data Reviewed: Basic Metabolic Panel: Recent Labs  Lab 02/24/21 0419  NA 138  K 3.8  CL 97*  CO2 32  GLUCOSE 249*  BUN 8  CREATININE 0.95  CALCIUM 9.3   Liver Function Tests: Recent Labs  Lab 02/24/21 0419  AST 15  ALT 14  ALKPHOS 46  BILITOT 0.3  PROT 5.2*  ALBUMIN 2.5*   No results for input(s): LIPASE,  AMYLASE in the last 168 hours. No results for input(s): AMMONIA in the last 168 hours. CBC: Recent Labs  Lab 02/24/21 0419  WBC 8.0  HGB 11.2*  HCT 34.0*  MCV 89.2  PLT 195   Cardiac Enzymes: No results for input(s): CKTOTAL, CKMB, CKMBINDEX, TROPONINI in the last 168 hours. BNP (last 3 results) Recent Labs    01/14/21 0238 01/15/21 0230 01/16/21 0201  BNP 177.8* 115.9* 40.9    ProBNP (last 3 results) No results for input(s): PROBNP in the last 8760 hours.  CBG: Recent Labs  Lab 02/23/21 1131 02/23/21 1657 02/23/21 1958 02/24/21 0015 02/24/21 0332  GLUCAP 126* 105* 147* 127* 223*    No results found for this or any previous visit (from the past 240 hour(s)).    Studies: No results found.  Scheduled Meds:  atorvastatin  20 mg Oral Daily   cholecalciferol  1,000 Units Oral Daily   divalproex  500 mg Oral BID   escitalopram  20 mg Oral Daily   feeding supplement (GLUCERNA SHAKE)  237 mL Oral TID AC & HS   gabapentin  300 mg Oral BID   insulin aspart  0-9 Units Subcutaneous Q4H   insulin aspart protamine- aspart  2 Units Subcutaneous Q supper   insulin aspart protamine- aspart  6 Units Subcutaneous Q breakfast   metFORMIN  1,000 mg Oral BID WC   montelukast  10 mg Oral QHS   pantoprazole  40 mg Oral Daily   senna-docusate  2 tablet Oral Daily   thiamine  100 mg  Oral Daily   Continuous Infusions:  vancomycin      Principal Problem:   Bipolar 1 disorder, depressed, full remission (HCC) Active Problems:   Bipolar 1 disorder (HCC)   Hypertension   Generalized weakness   Controlled type 2 diabetes mellitus with hyperglycemia, with long-term current use of insulin (HCC)   Hyperlipidemia   NPH (normal pressure hydrocephalus) (HCC)   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   Consultants: Psychiatry  Procedures: None  Antibiotics: Anti-infectives (From admission, onward)    Start     Dose/Rate Route Frequency Ordered Stop   02/14/21 0828  vancomycin  (VANCOREADY) IVPB 1000 mg/200 mL        1,000 mg 200 mL/hr over 60 Minutes Intravenous 60 min pre-op 02/14/21 0828     02/13/21 2115  cefTRIAXone (ROCEPHIN) 1 g in sodium chloride 0.9 % 100 mL IVPB  Status:  Discontinued        1 g 200 mL/hr over 30 Minutes Intravenous Every 24 hours 02/13/21 2016 02/16/21 0844        Time spent: 25 minutes    [name] ANP  [facility] 7 am - 330 pm/M-F for direct patient care and secure chat Please refer to Amion for contact info 43  days

## 2021-02-24 NOTE — Plan of Care (Signed)
  Problem: Health Behavior/Discharge Planning: Goal: Ability to manage health-related needs will improve Outcome: Progressing   Problem: Activity: Goal: Risk for activity intolerance will decrease Outcome: Progressing   Problem: Elimination: Goal: Will not experience complications related to bowel motility Outcome: Progressing   Problem: Safety: Goal: Ability to remain free from injury will improve Outcome: Progressing   

## 2021-02-25 LAB — GLUCOSE, CAPILLARY
Glucose-Capillary: 175 mg/dL — ABNORMAL HIGH (ref 70–99)
Glucose-Capillary: 233 mg/dL — ABNORMAL HIGH (ref 70–99)
Glucose-Capillary: 243 mg/dL — ABNORMAL HIGH (ref 70–99)
Glucose-Capillary: 178 mg/dL — ABNORMAL HIGH (ref 70–99)
Glucose-Capillary: 203 mg/dL — ABNORMAL HIGH (ref 70–99)
Glucose-Capillary: 324 mg/dL — ABNORMAL HIGH (ref 70–99)
Glucose-Capillary: 261 mg/dL — ABNORMAL HIGH (ref 70–99)

## 2021-02-25 NOTE — TOC Progression Note (Signed)
Transition of Care [facility]) - Progression Note    Patient Details  Name: Carolyn Norton MRN: [id number] Date of Birth: June 24, 1966  Transition of Care [facility]) CM/SW Contact  Janae Bridgeman, RN Phone Number: 02/25/2021, 12:41 PM  Clinical Narrative:    Case management called and spoke with [name], CM with [facility] in [place], [place], [place] with [facility] and [place], CM with [facility] and asked if the facilities would consider the patient for admission.  Updated clinicals were sent in the hub at this time.  CM will continue to follow the patient for [facility] admission.   Expected Discharge Plan: Skilled Nursing Facility Barriers to Discharge: Family Issues, Continued Medical Work up  Expected Discharge Plan and Services Expected Discharge Plan: Skilled Nursing Facility In-house Referral: Clinical Social Work Discharge Planning Services: CM Consult Post Acute Care Choice: Skilled Nursing Facility Living arrangements for the past 2 months: Single Family Home Expected Discharge Date: 01/16/21               DME Arranged: [name] rolling DME Agency: [employer] Date DME Agency Contacted: 01/20/21 Time DME Agency Contacted: 1435 Representative spoke with at DME Agency: [name]             Social Determinants of Health (SDOH) Interventions    Readmission Risk Interventions Readmission Risk Prevention Plan 01/14/2021  Transportation Screening Complete  PCP or Specialist Appt within 3-5 Days (No Data)  Social Work Consult for Recovery Care Planning/Counseling Complete  Palliative Care Screening Not Applicable  Medication Review [employer]) Referral to Pharmacy  Some recent data might be hidden

## 2021-02-25 NOTE — Progress Notes (Signed)
Occupational Therapy Treatment Patient Details Name: Carolyn Norton MRN: [id number] DOB: [birth year] Today's Date: 02/25/2021    History of present illness Pt is a 55 y.o. female admitted 01/11/21 with multiple falls, AMS, weakness. Workup for severe dehydration, DKA, metabolic encephalopathy. 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 Neurosurgery following for possible lumbar drain trial vs repeat LP vs VP shunt placement. Placement of lumbar drain on 6/10 cancelled due to UTI. PMH includes DM2, COPD, CKD3, GERD, bipolar disorder, COVID-19, polysubstance abuse including cocaine.   OT comments  Pt received supine in bed agreeable to OT intervention. Pt presents today with impaired sitting balance, therefore session focus on static sitting balance. Pt currently requires MAXA +2 for bed mobility and minguard - MAX A for sitting balance. Pt with no righting reaction noted during sitting balance. Awareness continues to be an issue as pt also with no awareness to BM. RN reports pt difficulty with self feeding, worked on hand to mouth pattern with pt needing > 20 seconds to bring hand to mouth but is able to complete task with increased time and effort. Will continue to work on below deficits, DC plan remains appropriate, will follow acutely per POC.   Follow Up Recommendations  SNF;Supervision/Assistance - 24 hour    Equipment Recommendations  3 in 1 bedside commode    Recommendations for Other Services      Precautions / Restrictions Precautions Precautions: Fall Restrictions Weight Bearing Restrictions: No       Mobility Bed Mobility Overal bed mobility: Needs Assistance Bed Mobility: Supine to Sit;Sit to Supine     Supine to sit: Max assist;+2 for physical assistance Sit to supine: +2 for physical assistance;Max assist   General bed mobility comments: pt required step by step cues to sequence bed mobility, with pt still needing MAX A +2 for all aspects     Transfers                 General transfer comment: unable to transfer this date to poor sitting balance    Balance Overall balance assessment: Needs assistance;History of Falls Sitting-balance support: Feet supported;Bilateral upper extremity supported Sitting balance-Leahy Scale: Poor Sitting balance - Comments: up to MAX A for static sitting balanc with BUE support however pt only able to sustain ~ 20 secs of static sitting balance with min guard without losing balance posteriorly Postural control: Posterior lean                                 ADL either performed or assessed with clinical judgement   ADL Overall ADL's : Needs assistance/impaired Eating/Feeding: Set up;Sitting Eating/Feeding Details (indicate cue type and reason): pt very slow to complete hand to mouth pattern but can complete pattern with increased time and set- up assist         Lower Body Bathing: Maximal assistance;Bed level Lower Body Bathing Details (indicate cue type and reason): simulated via posterior pericare from bed level Upper Body Dressing : Maximal assistance;Sitting Upper Body Dressing Details (indicate cue type and reason): to don new gown       Toilet Transfer Details (indicate cue type and reason): unable to attempt sit<>stand this date d/t impaired sitting balance Toileting- Clothing Manipulation and Hygiene: Total assistance;Bed level Toileting - Clothing Manipulation Details (indicate cue type and reason): Total A for hygiene/clothing management at bed level after BM.     Functional mobility during ADLs:  Maximal assistance;+2 for physical assistance (bed mobility only) General ADL Comments: pt with impaired sitting balance this session, focus of session on static sitting balance EOB     Vision       Perception     Praxis      Cognition Arousal/Alertness: Awake/alert Behavior During Therapy: Flat affect Overall Cognitive Status: Impaired/Different from  baseline Area of Impairment: Orientation;Attention;Memory;Following commands;Safety/judgement;Awareness;Problem solving                 Orientation Level: Disoriented to;Time (pt reports its 2002) Current Attention Level: Sustained;Focused Memory: Decreased short-term memory (repeated cues) Following Commands: Follows one step commands inconsistently;Follows one step commands with increased time;Follows multi-step commands inconsistently Safety/Judgement: Decreased awareness of safety;Decreased awareness of deficits (no righting reactions noted) Awareness: Intellectual Problem Solving: Slow processing;Difficulty sequencing;Requires verbal cues;Decreased initiation;Requires tactile cues General Comments: pt continues to present with slow process, pt improving with immediate recall but requries increased time to follow all commands.        Exercises     Shoulder Instructions       General Comments      Pertinent Vitals/ Pain       Pain Assessment: No/denies pain  Home Living                                          Prior Functioning/Environment              Frequency  Min 2X/week        Progress Toward Goals  OT Goals(current goals can now be found in the care plan section)  Progress towards OT goals: Progressing toward goals (slow and inconsistent goal progression)  Acute Rehab OT Goals OT Goal Formulation: Patient unable to participate in goal setting Time For Goal Achievement: 02/28/21 Potential to Achieve Goals: Fair  Plan Discharge plan remains appropriate;Frequency remains appropriate    Co-evaluation                 AM-PAC OT "6 Clicks" Daily Activity     Outcome Measure   Help from another person eating meals?: A Little Help from another person taking care of personal grooming?: A Little Help from another person toileting, which includes using toliet, bedpan, or urinal?: A Lot Help from another person bathing  (including washing, rinsing, drying)?: A Little Help from another person to put on and taking off regular upper body clothing?: A Lot Help from another person to put on and taking off regular lower body clothing?: A Lot 6 Click Score: 15    End of Session    OT Visit Diagnosis: Unsteadiness on feet (R26.81);Other abnormalities of gait and mobility (R26.89);Muscle weakness (generalized) (M62.81);Other symptoms and signs involving cognitive function;Other symptoms and signs involving the nervous system (R29.898)   Activity Tolerance Patient tolerated treatment well   Patient Left in bed;with call bell/phone within reach;with bed alarm set;Other (comment) (PRAFOs donned)   Nurse Communication Mobility status        Time: 0930-1008 OT Time Calculation (min): 38 min  Charges: OT General Charges $OT Visit: 1 Visit OT Treatments $Self Care/Home Management : 8-22 mins $Therapeutic Activity: 8-22 mins  Lenor Derrick., COTA/L Acute Rehabilitation Services [phone]    Barron Schmid 02/25/2021, 12:03 PM

## 2021-02-25 NOTE — Plan of Care (Signed)

## 2021-02-25 NOTE — Progress Notes (Signed)
[facility] PROGRESS NOTE  Carolyn Norton LKT:[id number] DOB: Jul 03, 1966 DOA: 01/11/2021 PCP: Carolyn Lodge, Carolyn Norton     Status: Remains inpatient appropriate because:Unsafe d/c plan-due to patient's underlying poor mental status short-term memory deficits she will require 24/7 care after discharge  Dispo:  Patient From:  Home  Planned Disposition: SNF  Medically stable for discharge:  yes  Barriers to DC: Continues to require significant assistance with mobility.  Continues to have significant short-term memory deficits impeding patient's ability to safely return home with elderly parents; please note that the patient's father continues to work outside of the home therefore the burden of care would follow-up on the patient's elderly mother             Difficult to place: Yes  Level of care: Med-Surg  Code Status: DNR Family Communication: 55/10 Mother Carolyn Norton updated DVT prophylaxis: Lovenox COVID vaccination status: Unknown   HPI: 55 year old female past medical history of diabetes mellitus type 2, bipolar disorder type I, previous COVID infection, polysubstance abuse that includes cocaine.  She presented from home where she lives with her parents.  Her presenting symptoms included generalized weakness, multiple falls and confusion.  Family confirmed that she had been having the symptoms for at least a month with last use of crack cocaine 1 month prior to presentation.  Ever since that time she had not been doing well.  She would also be having issues with hyperglycemia.  In the ER she was diagnosed with severe dehydration, hyperglycemia and metabolic encephalopathy.  Since admission patient has been evaluated by the psychiatric team who has deemed her alert and oriented and having capacity to make decisions.  Patient is agreeable to placement at a rehab.  Currently she has severe physical deconditioning which was POA and PT is recommending SNF.  Because of her history of  polysubstance abuse and lack of funding SNF is not an option.  Family is agreeable to taking her back in home when she is able to ambulate well enough to get back and forth for meals into the bathroom.  Subjective: Awake.  Just completed therapy session.  Therapist state she continues to have both physical as well as cognitive issues impacting mobility and overall physical strength as well as safety awareness.  Objective: Vitals:   02/24/21 2100 02/25/21 0320  BP: 127/68 138/72  Pulse: 82 78  Resp: 20 17  Temp: 97.8 F (36.6 C) 97.7 F (36.5 C)  SpO2: 93% 92%    Intake/Output Summary (Last 24 hours) at 02/25/2021 0736 Last data filed at 02/24/2021 1643 Gross per 24 hour  Intake --  Output 400 ml  Net -400 ml   Filed Weights   01/11/21 1152  Weight: 99.8 kg    Exam:  Constitutional: Awake, pleasant, no acute distress Respiratory: Lungs are clear, stable on room air Cardiac: Normal heart sounds, no peripheral edema, regular pulse Abdomen: LBM 6/20, soft nontender nondistended.  Poor oral intake with reports of continued anorexia Neurologic: CN 2-12 grossly intact. Sensation intact, continues to have deficits in strength noting upper extremity strength 3-4/5 with therapies noting difficulty with patient coordinating to mouth motions while eating.  Also lower extremity strength is weak with right being 4/5 and left being 3/5 noting she was unable to lift left leg off the bed when instructed to do so. Psychiatric: Alert, oriented to name and place but not for reason for hospital admission or year.  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 NPH Significantly abnormal cognitive screening tests (short Blessed test and SLUMS)  Neurology and neurosurgery have signed off. S/p large volume LP Per neurology and neurosurgery imaging is not a "slam dunk" to definitely confirm the diagnosis of NPH  Patient will need to follow-up with neurosurgery after discharge. **Neurologicaleam/neurosurgeon has documented that NPH is not emergent and does not need a LD trial before placement to SNF.  She can be followed up in the outpatient setting after discharge if a bed is available before can arrange for an LD trial**  Anorexia Protein beverages ordered but patient inconsistently consuming  Abnormal urinalysis/UTI Completed Rocephin x 3 days  Diabetes mellitus 2 with hyperglycemia on insulin Hemoglobin A1c 11.0 New low-dose 70/30 insulin BID Variable intake persists Continue SSI  Dyslipidemia Continue statin  History of bipolar 1 disorder/polysubstance abuse/cognitive impairment Continue escitalopram, Neurontin and Depakote Functional cognitive evaluation with significant abnormalities  Physical deconditioning/bilateral heel cord shortening Continue bilateral PRAFO boots on every 4 hours and off every 4 hours SNF at discharge   Other problems: Acute kidney injury on CKD 3a Acute kidney injury resolved after IV fluids Baseline creatinine 1.3  COPD Continue Singulair, nebs as needed, consider adding LABA  GERD Continue PPI  Hypokalemia/hypomagnesemia Resolved  Data Reviewed: Basic Metabolic Panel: Recent Labs  Lab 02/24/21 0419  NA 138  K 3.8  CL 97*  CO2 32  GLUCOSE 249*  BUN 8  CREATININE  0.95  CALCIUM 9.3   Liver Function Tests: Recent Labs  Lab 02/24/21 0419  AST 15  ALT 14  ALKPHOS 46  BILITOT 0.3  PROT 5.2*  ALBUMIN 2.5*   No results for input(s): LIPASE, AMYLASE in the last 168 hours. No results for input(s): AMMONIA in the last 168 hours. CBC: Recent Labs  Lab 02/24/21 0419  WBC 8.0  HGB 11.2*  HCT 34.0*  MCV 89.2  PLT 195   Cardiac Enzymes: No results for input(s): CKTOTAL, CKMB, CKMBINDEX, TROPONINI in the last 168 hours. BNP (last 3 results) Recent Labs    01/14/21 0238 01/15/21 0230 01/16/21 0201  BNP 177.8* 115.9* 40.9    ProBNP (last 3 results) No results for input(s): PROBNP in the last 8760 hours.  CBG: Recent Labs  Lab 02/24/21 2005 02/24/21 2302 02/25/21 0315 02/25/21 0652 02/25/21 0728  GLUCAP 211* 218* 175* 233* 243*    No results found for this or any previous visit (from the past 240 hour(s)).    Studies: No results found.  Scheduled Meds:  atorvastatin  20 mg Oral Daily   cholecalciferol  1,000 Units Oral Daily   divalproex  500 mg Oral BID   escitalopram  20 mg Oral Daily   feeding supplement (GLUCERNA SHAKE)  237 mL Oral TID AC & HS   gabapentin  300 mg Oral BID   insulin aspart  0-9 Units Subcutaneous Q4H   insulin aspart protamine- aspart  2 Units Subcutaneous Q supper   insulin aspart protamine- aspart  6 Units Subcutaneous Q  breakfast   metFORMIN  1,000 mg Oral BID WC   montelukast  10 mg Oral QHS   pantoprazole  40 mg Oral Daily   senna-docusate  2 tablet Oral Daily   thiamine  100 mg Oral Daily   Continuous Infusions:  vancomycin      Principal Problem:   Bipolar 1 disorder, depressed, full remission (HCC) Active Problems:   Bipolar 1 disorder (HCC)   Hypertension   Generalized weakness   Controlled type 2 diabetes mellitus with hyperglycemia, with long-term current use of insulin (HCC)   Hyperlipidemia   NPH (normal pressure hydrocephalus) (HCC)   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   Consultants: Psychiatry Neurology Neurosurgery  Procedures: None  Antibiotics: Anti-infectives (From admission, onward)    Start     Dose/Rate Route Frequency Ordered Stop   02/14/21 0828  vancomycin (VANCOREADY) IVPB 1000 mg/200 mL        1,000 mg 200 mL/hr over 60 Minutes Intravenous 60 min pre-op 02/14/21 0828     02/13/21 2115  cefTRIAXone (ROCEPHIN) 1 g in sodium chloride 0.9 % 100 mL IVPB  Status:  Discontinued        1 g 200 mL/hr over 30 Minutes Intravenous Every 24 hours 02/13/21 2016 02/16/21 0844        Time spent: 25 minutes    [name] ANP  [facility] 7 am - 330 pm/M-F for direct patient care and secure chat Please refer to Amion for contact info 44  days

## 2021-02-26 LAB — GLUCOSE, CAPILLARY
Glucose-Capillary: 64 mg/dL — ABNORMAL LOW (ref 70–99)
Glucose-Capillary: 65 mg/dL — ABNORMAL LOW (ref 70–99)
Glucose-Capillary: 176 mg/dL — ABNORMAL HIGH (ref 70–99)
Glucose-Capillary: 194 mg/dL — ABNORMAL HIGH (ref 70–99)
Glucose-Capillary: 68 mg/dL — ABNORMAL LOW (ref 70–99)
Glucose-Capillary: 77 mg/dL (ref 70–99)
Glucose-Capillary: 216 mg/dL — ABNORMAL HIGH (ref 70–99)
Glucose-Capillary: 340 mg/dL — ABNORMAL HIGH (ref 70–99)

## 2021-02-26 MED ORDER — MEGESTROL ACETATE 40 MG PO TABS
800.0000 mg | ORAL_TABLET | Freq: Every day | ORAL | Status: DC
  Filled 2021-02-26: qty 20

## 2021-02-26 MED ORDER — ENOXAPARIN SODIUM 40 MG/0.4ML IJ SOSY
40.0000 mg | PREFILLED_SYRINGE | INTRAMUSCULAR | Status: DC
  Administered 2021-02-26 – 2021-03-15 (×18): 40 mg via SUBCUTANEOUS
  Filled 2021-02-26 (×18): qty 0.4

## 2021-02-26 MED ORDER — MEGESTROL ACETATE 400 MG/10ML PO SUSP
800.0000 mg | Freq: Every day | ORAL | Status: DC
  Administered 2021-02-26 – 2021-03-04 (×7): 800 mg via ORAL
  Filled 2021-02-26 (×8): qty 20

## 2021-02-26 MED ORDER — INSULIN ASPART 100 UNIT/ML IJ SOLN
0.0000 [IU] | INTRAMUSCULAR | Status: DC
  Administered 2021-02-26: 4 [IU] via SUBCUTANEOUS
  Administered 2021-02-26 – 2021-02-27 (×3): 2 [IU] via SUBCUTANEOUS

## 2021-02-26 MED ORDER — INSULIN ASPART 100 UNIT/ML IJ SOLN
0.0000 [IU] | Freq: Three times a day (TID) | INTRAMUSCULAR | Status: DC
  Administered 2021-02-26: 2 [IU] via SUBCUTANEOUS

## 2021-02-26 NOTE — TOC Progression Note (Signed)
Transition of Care [facility]) - Progression Note    Patient Details  Name: Carolyn Norton MRN: [id number] Date of Birth: 06-20-1966  Transition of [facility]) CM/SW Contact  Janae Bridgeman, RN Phone Number: 02/26/2021, 7:58 AM  Clinical Narrative:    Case management spoke with [name], CM with [facility] in [place], [place] and the facility is currently reviewing the patient for admission to the facility under a LOG contract.  [name], CM supervisor with [facility] is reviewing the patient at this time for consideration.  CM and MSW will continue to follow the patient for SNF admission.   Expected Discharge Plan: Skilled Nursing Facility Barriers to Discharge: Family Issues, Continued Medical Work up  Expected Discharge Plan and Services Expected Discharge Plan: Skilled Nursing Facility In-house Referral: Clinical Social Work Discharge Planning Services: CM Consult Post Acute Care [facility]: Skilled Nursing Facility Living arrangements for the past 2 months: Single Family Home Expected Discharge Date: 01/16/21               DME Arranged: [name] rolling DME Agency: [employer] Date DME Agency Contacted: 01/20/21 Time DME Agency Contacted: 1435 Representative spoke with at DME Agency: [name]             Social Determinants of Health (SDOH) Interventions    Readmission Risk Interventions Readmission Risk Prevention Plan 01/14/2021  Transportation Screening Complete  PCP or Specialist Appt within 3-5 Days (No Data)  Social Work Consult for Recovery Care Planning/Counseling Complete  Palliative Care Screening Not Applicable  Medication Review [employer]) Referral to Pharmacy  Some recent data might be hidden

## 2021-02-26 NOTE — Progress Notes (Signed)
Cbg recheck 65, given of juice. Pt alert and responsive, Will recheck blood glucose  within .

## 2021-02-26 NOTE — Progress Notes (Addendum)
[facility] PROGRESS NOTE  Carolyn Norton NOM:[id number] DOB: 12-31-1965 DOA: 01/11/2021 PCP: [name], FNP     Status: Remains inpatient appropriate because:Unsafe d/c plan-due to patient's underlying poor mental status short-term memory deficits she will require 24/7 care after discharge  Dispo:  Patient From:  Home  Planned Disposition: SNF  Medically stable for discharge:  yes  Barriers to DC: Continues to require significant assistance with mobility.  Continues to have significant short-term memory deficits impeding patient's ability to safely return home with elderly parents; please note that the patient's father continues to work outside of the home therefore the burden of care would follow-up on the patient's elderly mother             Difficult to place: Yes  Level of care: Med-Surg  Code Status: DNR Family Communication: 22/10 Mother [name] updated DVT prophylaxis: Lovenox COVID vaccination status: Unknown   HPI: 55 year old female past medical history of diabetes mellitus type 2, bipolar disorder type I, previous COVID infection, polysubstance abuse that includes cocaine.  She presented from home where she lives with her parents.  Her presenting symptoms included generalized weakness, multiple falls and confusion.  Family confirmed that she had been having the symptoms for at least a month with last use of crack cocaine 1 month prior to presentation.  Ever since that time she had not been doing well.  She would also be having issues with hyperglycemia.  In the ER she was diagnosed with severe dehydration, hyperglycemia and metabolic encephalopathy.  Since admission patient has been evaluated by the psychiatric team who has deemed her alert and oriented and having capacity to make decisions.  Patient is agreeable to placement at a rehab.  Currently she has severe physical deconditioning which was POA and PT is recommending SNF.  Because of her history of  polysubstance abuse and lack of funding SNF is not an option.  Family is agreeable to taking her back in home when she is able to ambulate well enough to get back and forth for meals into the bathroom.  Subjective: Wake and pleasant.  Continues to deny significant appetite.  Continues to have issues with bowel and bladder incontinence as well.  Objective: Vitals:   02/25/21 1939 02/26/21 0509  BP: 134/77 (!) 142/80  Pulse: 99 76  Resp: 20 20  Temp: 98 F (36.7 C) 98.2 F (36.8 C)  SpO2: 94% 95%    Intake/Output Summary (Last 24 hours) at 02/26/2021 0756 Last data filed at 02/26/2021 0331 Gross per 24 hour  Intake --  Output 950 ml  Net -950 ml   Filed Weights   01/11/21 1152  Weight: 99.8 kg    Exam:  Constitutional: Alert and pleasant and in no acute distress Respiratory: Lungs are clear.  Stable on room air. Cardiac: Normotensive, normal heart sounds, no peripheral edema. Abdomen: LBM 6/21, soft nontender nondistended with normoactive bowel sounds Neurologic: CN 2-12 grossly intact. Sensation intact, continues to have deficits in strength noting upper extremity strength 3-4/5 with therapies noting difficulty with patient coordinating to mouth motions while eating.  Also lower extremity strength is weak with right being 4/5 and left being 3/5  Psychiatric: Alert and pleasant.  Continues to remain confused noted only oriented to name and place.  Not to year, time or day, reason for hospitalization recurrent physical debility.  Asked about her mobility she reported "it is getting better and almost back to where I was before I came to the hospital".  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 NPH Significantly abnormal cognitive screening tests (short Blessed test and SLUMS)  Neurology and neurosurgery have signed off. S/p large volume LP without any significant improvement in status Per neurology and neurosurgery imaging is not a "slam dunk" to definitely confirm the diagnosis of NPH  Patient will need to follow-up with neurology and neurosurgery after discharge. **Neurologicaleam/neurosurgeon has documented that NPH is not emergent and does not need a LD trial before placement to SNF.  She can be followed up in the outpatient setting after discharge if a bed is available before can arrange for an LD trial**  Anorexia Protein beverages ordered but patient inconsistently consuming Add Megace 800 mg daily Resume DVT prophylaxis with Lovenox  Abnormal urinalysis/UTI Completed Rocephin x 3 days  Diabetes mellitus 2 with hyperglycemia on insulin Hemoglobin A1c 11.0 New low-dose 70/30 insulin BID Variable intake persists Continue SSI but decreased to TID AC and decreased from sensitive to extra sensitive given poor intake and underlying hypoglycemia  Dyslipidemia Continue statin  History of bipolar 1 disorder/polysubstance abuse/cognitive impairment Continue escitalopram, Neurontin and Depakote Functional cognitive evaluation with significant abnormalities  Physical deconditioning/bilateral heel cord shortening Continue bilateral PRAFO boots on every 4 hours and off every 4 hours SNF at discharge   Other problems: Acute kidney injury on CKD 3a Acute kidney injury resolved after IV fluids Baseline creatinine 1.3  COPD Continue Singulair, nebs as needed, consider adding LABA  GERD Continue PPI  Hypokalemia/hypomagnesemia Resolved  Data Reviewed: Basic Metabolic Panel: Recent Labs  Lab  02/24/21 0419  NA 138  K 3.8  CL 97*  CO2 32  GLUCOSE 249*  BUN 8  CREATININE 0.95  CALCIUM 9.3   Liver Function Tests: Recent Labs  Lab 02/24/21 0419  AST 15  ALT 14  ALKPHOS 46  BILITOT 0.3  PROT 5.2*  ALBUMIN 2.5*   No results for input(s): LIPASE, AMYLASE in the last 168 hours. No results for input(s): AMMONIA in the last 168 hours. CBC: Recent Labs  Lab 02/24/21 0419  WBC 8.0  HGB 11.2*  HCT 34.0*  MCV 89.2  PLT 195   Cardiac Enzymes: No results for input(s): CKTOTAL, CKMB, CKMBINDEX, TROPONINI in the last 168 hours. BNP (last 3 results) Recent Labs    01/14/21 0238 01/15/21 0230 01/16/21 0201  BNP 177.8* 115.9* 40.9    ProBNP (last 3 results) No results for input(s): PROBNP in the last 8760 hours.  CBG: Recent Labs  Lab 02/25/21 1941 02/25/21 2306 02/26/21 0330 02/26/21 0639 02/26/21 0754  GLUCAP 324* 261* 64* 65* 176*    No results found for this or any previous visit (from the past 240 hour(s)).    Studies: No results found.  Scheduled Meds:  atorvastatin  20 mg Oral Daily   cholecalciferol  1,000 Units Oral Daily   divalproex  500 mg Oral BID   escitalopram  20 mg Oral Daily   feeding supplement (GLUCERNA SHAKE)  237 mL Oral TID AC & HS   gabapentin  300 mg Oral BID   insulin aspart  0-9 Units Subcutaneous Q4H   insulin aspart protamine- aspart  2 Units Subcutaneous Q  supper   insulin aspart protamine- aspart  6 Units Subcutaneous Q breakfast   metFORMIN  1,000 mg Oral BID WC   montelukast  10 mg Oral QHS   pantoprazole  40 mg Oral Daily   senna-docusate  2 tablet Oral Daily   thiamine  100 mg Oral Daily   Continuous Infusions:  vancomycin      Principal Problem:   Bipolar 1 disorder, depressed, full remission (HCC) Active Problems:   Bipolar 1 disorder (HCC)   Hypertension   Generalized weakness   Controlled type 2 diabetes mellitus with hyperglycemia, with long-term current use of insulin (HCC)   Hyperlipidemia    NPH (normal pressure hydrocephalus) (HCC)   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   Consultants: Psychiatry Neurology Neurosurgery  Procedures: None  Antibiotics: Anti-infectives (From admission, onward)    Start     Dose/Rate Route Frequency Ordered Stop   02/14/21 0828  vancomycin (VANCOREADY) IVPB 1000 mg/200 mL        1,000 mg 200 mL/hr over 60 Minutes Intravenous 60 min pre-op 02/14/21 0828     02/13/21 2115  cefTRIAXone (ROCEPHIN) 1 g in sodium chloride 0.9 % 100 mL IVPB  Status:  Discontinued        1 g 200 mL/hr over 30 Minutes Intravenous Every 24 hours 02/13/21 2016 02/16/21 0844        Time spent: 25 minutes    [name] ANP  [facility] 7 am - 330 pm/M-F for direct patient care and secure chat Please refer to Amion for contact info 45  days

## 2021-02-27 LAB — COMPREHENSIVE METABOLIC PANEL
ALT: 12 U/L (ref 0–44)
AST: 14 U/L — ABNORMAL LOW (ref 15–41)
Albumin: 2.7 g/dL — ABNORMAL LOW (ref 3.5–5.0)
Alkaline Phosphatase: 45 U/L (ref 38–126)
Anion gap: 7 (ref 5–15)
BUN: 11 mg/dL (ref 6–20)
CO2: 33 mmol/L — ABNORMAL HIGH (ref 22–32)
Calcium: 9.3 mg/dL (ref 8.9–10.3)
Chloride: 95 mmol/L — ABNORMAL LOW (ref 98–111)
Creatinine, Ser: 0.94 mg/dL (ref 0.44–1.00)
GFR, Estimated: >60 mL/min (ref >60)
Glucose, Bld: 287 mg/dL — ABNORMAL HIGH (ref 70–99)
Potassium: 4 mmol/L (ref 3.5–5.1)
Sodium: 135 mmol/L (ref 135–145)
Total Bilirubin: 0.5 mg/dL (ref 0.3–1.2)
Total Protein: 5.7 g/dL — ABNORMAL LOW (ref 6.5–8.1)

## 2021-02-27 LAB — GLUCOSE, CAPILLARY
Glucose-Capillary: 282 mg/dL — ABNORMAL HIGH (ref 70–99)
Glucose-Capillary: 249 mg/dL — ABNORMAL HIGH (ref 70–99)
Glucose-Capillary: 298 mg/dL — ABNORMAL HIGH (ref 70–99)
Glucose-Capillary: 169 mg/dL — ABNORMAL HIGH (ref 70–99)
Glucose-Capillary: 162 mg/dL — ABNORMAL HIGH (ref 70–99)
Glucose-Capillary: 161 mg/dL — ABNORMAL HIGH (ref 70–99)

## 2021-02-27 LAB — CBC WITH DIFFERENTIAL/PLATELET
Abs Immature Granulocytes: 0.01 10*3/uL (ref 0.00–0.07)
Basophils Absolute: 0.1 10*3/uL (ref 0.0–0.1)
Basophils Relative: 1 %
Eosinophils Absolute: 0.2 10*3/uL (ref 0.0–0.5)
Eosinophils Relative: 3 %
HCT: 39.2 % (ref 36.0–46.0)
Hemoglobin: 13.1 g/dL (ref 12.0–15.0)
Immature Granulocytes: 0 %
Lymphocytes Relative: 47 %
Lymphs Abs: 3.7 10*3/uL (ref 0.7–4.0)
MCH: 29.3 pg (ref 26.0–34.0)
MCHC: 33.4 g/dL (ref 30.0–36.0)
MCV: 87.7 fL (ref 80.0–100.0)
Monocytes Absolute: 0.8 10*3/uL (ref 0.1–1.0)
Monocytes Relative: 10 %
Neutro Abs: 3 10*3/uL (ref 1.7–7.7)
Neutrophils Relative %: 39 %
Platelets: 194 10*3/uL (ref 150–400)
RBC: 4.47 MIL/uL (ref 3.87–5.11)
RDW: 15.2 % (ref 11.5–15.5)
WBC: 7.7 10*3/uL (ref 4.0–10.5)
nRBC: 0 % (ref 0.0–0.2)

## 2021-02-27 LAB — IRON AND TIBC
Iron: 50 ug/dL (ref 28–170)
Saturation Ratios: 19 % (ref 10.4–31.8)
TIBC: 262 ug/dL (ref 250–450)
UIBC: 212 ug/dL

## 2021-02-27 LAB — C-REACTIVE PROTEIN: CRP: 0.5 mg/dL (ref <1.0)

## 2021-02-27 LAB — FERRITIN: Ferritin: 53 ng/mL (ref 11–307)

## 2021-02-27 LAB — RETICULOCYTES
Immature Retic Fract: 13.9 % (ref 2.3–15.9)
RBC.: 4.4 MIL/uL (ref 3.87–5.11)
Retic Count, Absolute: 84 10*3/uL (ref 19.0–186.0)
Retic Ct Pct: 1.9 % (ref 0.4–3.1)

## 2021-02-27 LAB — FOLATE: Folate: 14 ng/mL (ref >5.9)

## 2021-02-27 LAB — SEDIMENTATION RATE: Sed Rate: 7 mm/hr (ref 0–22)

## 2021-02-27 LAB — VITAMIN B12: Vitamin B-12: 580 pg/mL (ref 180–914)

## 2021-02-27 MED ORDER — INSULIN ASPART 100 UNIT/ML IJ SOLN
0.0000 [IU] | Freq: Three times a day (TID) | INTRAMUSCULAR | Status: DC
  Administered 2021-02-27: 8 [IU] via SUBCUTANEOUS
  Administered 2021-02-27 – 2021-02-28 (×2): 3 [IU] via SUBCUTANEOUS
  Administered 2021-02-28: 5 [IU] via SUBCUTANEOUS
  Administered 2021-02-28: 11 [IU] via SUBCUTANEOUS
  Administered 2021-03-01: 8 [IU] via SUBCUTANEOUS
  Administered 2021-03-01: 15 [IU] via SUBCUTANEOUS
  Administered 2021-03-01: 3 [IU] via SUBCUTANEOUS
  Administered 2021-03-02 – 2021-03-03 (×2): 15 [IU] via SUBCUTANEOUS

## 2021-02-27 NOTE — Progress Notes (Signed)
Occupational Therapy Treatment Patient Details Name: Carolyn Norton MRN: [id number] DOB: 05/18/1966 Today's Date: 02/27/2021    History of present illness Pt is a 55 y.o. female admitted 01/11/21 with multiple falls, AMS, weakness. Workup for severe dehydration, DKA, metabolic encephalopathy. 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 Neurosurgery following for possible lumbar drain trial vs repeat LP vs VP shunt placement. Placement of lumbar drain on 6/10 cancelled due to UTI. PMH includes DM2, COPD, CKD3, GERD, bipolar disorder, COVID-19, polysubstance abuse including cocaine.   OT comments  Patient making very slow progress toward goals waxing/waning day to day requiring Mod A to Max A +2 for bed mobility and functional transfers with use of mobility equipment. Patient also continues to be limited by cognitive deficits including but not limited to decreased command following, attention, sequencing, executive function, and memory. Goals downgrade to reflect patient limited progress. Patient also demonstrating difficulty with self-feeding tasks requiring hand over hand and Max A this date to spear items on breakfast tray and bring utensils to mouth despite environmental modifications to decrease distractions and increase attention to task. RN aware. Patient would benefit from continued acute OT services to maximize safety and independence with self-care tasks in prep for safe d/c to next level of care.    Follow Up Recommendations  SNF;Supervision/Assistance - 24 hour    Equipment Recommendations  3 in 1 bedside commode    Recommendations for Other Services      Precautions / Restrictions Precautions Precautions: Fall Restrictions Weight Bearing Restrictions: No       Mobility Bed Mobility Overal bed mobility: Needs Assistance Bed Mobility: Supine to Sit Rolling: Mod assist;Max assist Sidelying to sit: HOB elevated;Total assist       General bed  mobility comments: Max A to Total A for all parts of bed mobility. Patient given 1-step verbal commands but after >15 seconds, patient still not performing task (i.e. turning head or reaching for rail).    Transfers Overall transfer level: Needs assistance Equipment used: Ambulation equipment used Transfers: Sit to/from [employer] Sit to Stand: Min guard;Min assist Stand pivot transfers: Total assist       General transfer comment: Use of stedy for transfer to recliner.    Balance Overall balance assessment: Needs assistance;History of Falls Sitting-balance support: Feet supported;Bilateral upper extremity supported Sitting balance-Leahy Scale: Poor Sitting balance - Comments: Patient able to maintain static sitting balance only with BUE supported on stedy. Even with hands on stedy, patient with strong posterior lean despite max multimodal cues. Sitting balance improves with forward leaning and truncal rotation in preched position on stedy.   Standing balance support: During functional activity;Bilateral upper extremity supported Standing balance-Leahy Scale: Poor Standing balance comment: BUE support needed to static stand in stedy                           ADL either performed or assessed with clinical judgement   ADL Overall ADL's : Needs assistance/impaired Eating/Feeding: Set up;Maximal assistance Eating/Feeding Details (indicate cue type and reason): Patient requires Max A to to spear food on plate and bring to mouth. Unable to complete task without hand over hand assist at times even with changes to the environment to decrease distractions and increase attention to task. Requires more than increased time.  General ADL Comments: Session with focus on truncal rotation, cervical ROM, and BUE AROM in perched position in steady in prep for grooming tasks seated EOB.     Vision       Perception      Praxis      Cognition Arousal/Alertness: Awake/alert Behavior During Therapy: Flat affect Overall Cognitive Status: Impaired/Different from baseline Area of Impairment: Orientation;Attention;Memory;Following commands;Safety/judgement;Awareness;Problem solving                 Orientation Level: Disoriented to;Time (pt reports its 2002) Current Attention Level: Sustained;Focused Memory: Decreased short-term memory (repeated cues) Following Commands: Follows one step commands inconsistently;Follows one step commands with increased time;Follows multi-step commands inconsistently Safety/Judgement: Decreased awareness of safety;Decreased awareness of deficits (no righting reactions noted) Awareness: Intellectual Problem Solving: Slow processing;Difficulty sequencing;Requires verbal cues;Decreased initiation;Requires tactile cues General Comments: Follows 1-step verbal commands intermittently; requires more than increased time to follow 1-step verbal commands; unable to name 5 colors without cues.        Exercises Other Exercises Other Exercises: Truncal rotation and cervical neck ROM in prep for grooming tasks seated EOB.   Shoulder Instructions       General Comments      Pertinent Vitals/ Pain       Pain Assessment: No/denies pain Pain Intervention(s): Monitored during session  Home Living                                          Prior Functioning/Environment              Frequency  Min 2X/week        Progress Toward Goals  OT Goals(current goals can now be found in the care plan section)  Progress towards OT goals: Progressing toward goals  Acute Rehab OT Goals Patient Stated Goal: None stated. OT Goal Formulation: Patient unable to participate in goal setting Time For Goal Achievement: 03/14/21 Potential to Achieve Goals: Fair ADL Goals Pt Will Perform Eating: with set-up;sitting Pt Will Perform Grooming: with  supervision;sitting Pt Will Perform Upper Body Dressing: with min assist;sitting Pt Will Perform Lower Body Dressing: with mod assist;sit to/from stand Pt Will Transfer to Toilet: with mod assist;bedside commode Pt Will Perform Toileting - Clothing Manipulation and hygiene: with mod assist;sit to/from stand;sitting/lateral leans  Plan Discharge plan remains appropriate;Frequency remains appropriate    Co-evaluation                 AM-PAC OT "6 Clicks" Daily Activity     Outcome Measure   Help from another person eating meals?: A Lot Help from another person taking care of personal grooming?: A Little Help from another person toileting, which includes using toliet, bedpan, or urinal?: A Lot Help from another person bathing (including washing, rinsing, drying)?: A Little Help from another person to put on and taking off regular upper body clothing?: A Lot Help from another person to put on and taking off regular lower body clothing?: A Lot 6 Click Score: 14    End of Session Equipment Utilized During Treatment: Gait belt;Other (comment) Antony Salmon)  OT Visit Diagnosis: Unsteadiness on feet (R26.81);Other abnormalities of gait and mobility (R26.89);Muscle weakness (generalized) (M62.81);Other symptoms and signs involving cognitive function;Other symptoms and signs involving the nervous system (R29.898)   Activity Tolerance Patient tolerated treatment well   Patient Left in chair;with call bell/phone within reach;with chair alarm set  Nurse Communication Mobility status;Other (comment) (Response to treatment and need for assist for self-feeding.)        Time: [phone] OT Time Calculation (min): 36 min  Charges: OT General Charges $OT Visit: 1 Visit OT Treatments $Self Care/Home Management : 8-22 mins $Therapeutic Activity: 8-22 mins  Carrington Mullenax H. OTR/L Supplemental OT, Department of rehab services [phone]   Sears Oran R H. 02/27/2021, 12:37 PM

## 2021-02-27 NOTE — TOC Progression Note (Addendum)
Transition of Care [facility]) - Progression Note    Patient Details  Name: Carolyn Norton MRN: [id number] Date of Birth: 1966-04-07  Transition of Care [facility]) CM/SW Contact  Janae Bridgeman, RN Phone Number: 02/27/2021, 12:37 PM  Clinical Narrative:    Case management called and spoke with [name], CM with [facility] in [place] and they were unable to offer admission to the patient.  DON at facility declined a be offer to the patient.  CM and MSW will continue to follow the patient for SNF admission.  02/27/2021 1428 - CM spoke with Elita Quick, CM at [facility] in [place], [place] and she is currently reviewing the patient for possible admission.  Clinicals were faxed to the facility to fax # [phone].   Expected Discharge Plan: Skilled Nursing Facility Barriers to Discharge: Family Issues, Continued Medical Work up  Expected Discharge Plan and Services Expected Discharge Plan: Skilled Nursing Facility In-house Referral: Clinical Social Work Discharge Planning Services: CM Consult Post Acute Care Choice: Skilled Nursing Facility Living arrangements for the past 2 months: Single Family Home Expected Discharge Date: 01/16/21               DME Arranged: [name] rolling DME Agency: [employer] Date DME Agency Contacted: 01/20/21 Time DME Agency Contacted: 1435 Representative spoke with at DME Agency: [name]             Social Determinants of Health (SDOH) Interventions    Readmission Risk Interventions Readmission Risk Prevention Plan 01/14/2021  Transportation Screening Complete  PCP or Specialist Appt within 3-5 Days (No Data)  Social Work Consult for Recovery Care Planning/Counseling Complete  Palliative Care Screening Not Applicable  Medication Review [employer]) Referral to Pharmacy  Some recent data might be hidden

## 2021-02-27 NOTE — Plan of Care (Signed)

## 2021-02-27 NOTE — Progress Notes (Signed)
At approx 1 am pt had coughing episode. Some wheezing noted in bilateral upper lungs. Pt was given Albuterol neb and medication was effective. Lungs sounds did improve. No further episodes noted at this time.

## 2021-02-27 NOTE — Progress Notes (Signed)
Physical Therapy Treatment Patient Details Name: Carolyn Norton MRN: [id number] DOB: [birth year] Today's Date: 02/27/2021    History of Present Illness Pt is a 55 y.o. female admitted 01/11/21 with multiple falls, AMS, weakness. Workup for severe dehydration, DKA, metabolic encephalopathy. 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 Neurosurgery following for possible lumbar drain trial vs repeat LP vs VP shunt placement. Placement of lumbar drain on 6/10 cancelled due to UTI. PMH includes DM2, COPD, CKD3, GERD, bipolar disorder, COVID-19, polysubstance abuse including cocaine.    PT Comments    Patient up in chair on arrival with lean to her right despite pillows to prop up her right side. Able to progress to standing with 2 person assist with RW x 3 reps. Pt with posterior lean in part due to bil heel cord tightness. At end of session, pt remained in chair with bil PRAFOs donned.     Follow Up Recommendations  SNF (family wants to take pt home but cannot at current functional level)     Equipment Recommendations  Rolling walker with 5" wheels;3in1 (PT);Hospital bed;Wheelchair (measurements PT);Other (comment) (hoyer lift; *reassess after lumbar drain)    Recommendations for Other Services       Precautions / Restrictions Precautions Precautions: Fall Restrictions Weight Bearing Restrictions: No    Mobility  Bed Mobility Overal bed mobility: Needs Assistance Bed Mobility: Supine to Sit Rolling: Mod assist;Max assist Sidelying to sit: HOB elevated;Total assist       General bed mobility comments: Max A to Total A for all parts of bed mobility. Patient given 1-step verbal commands but after >15 seconds, patient still not performing task (i.e. turning head or reaching for rail).    Transfers Overall transfer level: Needs assistance Equipment used: Rolling walker (2 wheeled) Transfers: Sit to/from Stand Sit to Stand: Mod assist;+2 physical  assistance Stand pivot transfers: Total assist       General transfer comment: from recliner with RW; incr assist for anterior wt-shift over BOS, posterior lean in part due to tight heel cords; attempted to have pt march,but unable to lift either LE while standing (not even when wt-shifting provided to unweight one leg)  Ambulation/Gait             General Gait Details: unable   Stairs             Wheelchair Mobility    Modified Rankin (Stroke Patients Only)       Balance Overall balance assessment: Needs assistance;History of Falls Sitting-balance support: Feet supported;Bilateral upper extremity supported Sitting balance-Leahy Scale: Poor Sitting balance - Comments: after stretching rt side, progressed to close guarding sitting at edge of chair Postural control: Posterior lean Standing balance support: During functional activity;Bilateral upper extremity supported Standing balance-Leahy Scale: Poor Standing balance comment: BUE support needed to static stand in stedy                            Cognition Arousal/Alertness: Awake/alert Behavior During Therapy: WFL for tasks assessed/performed Overall Cognitive Status: Impaired/Different from baseline Area of Impairment: Attention;Memory;Following commands;Safety/judgement;Awareness;Problem solving                 Orientation Level:  (NT) Current Attention Level: Sustained;Focused Memory: Decreased short-term memory (repeated cues) Following Commands: Follows one step commands inconsistently;Follows one step commands with increased time;Follows multi-step commands inconsistently Safety/Judgement: Decreased awareness of safety;Decreased awareness of deficits (no righting reactions noted) Awareness: Intellectual Problem Solving: Slow processing;Difficulty  sequencing;Requires verbal cues;Decreased initiation;Requires tactile cues General Comments: Follows 1-step verbal commands intermittently;  requires more than increased time to follow 1-step verbal commands;      Exercises General Exercises - Lower Extremity Ankle Circles/Pumps: Both;10 reps;Seated;PROM Hip Flexion/Marching: Both;10 reps;[facility] Other Exercises Other Exercises: Truncal rotation and cervical neck ROM in prep for standing and to address sitting balance (pt tends to lean posteriorly and to the right)    General Comments        Pertinent Vitals/Pain Pain Assessment: Faces Faces Pain Scale: No hurt Pain Intervention(s): Monitored during session    Home Living                      Prior Function            PT Goals (current goals can now be found in the care plan section) Acute Rehab PT Goals Patient Stated Goal: None stated. PT Goal Formulation: Patient unable to participate in goal setting Time For Goal Achievement: 03/10/21 Potential to Achieve Goals: Fair Progress towards PT goals: Progressing toward goals    Frequency    Min 2X/week      PT Plan Current plan remains appropriate    Co-evaluation              AM-PAC PT "6 Clicks" Mobility   Outcome Measure  Help needed turning from your back to your side while in a flat bed without using bedrails?: A Lot Help needed moving from lying on your back to sitting on the side of a flat bed without using bedrails?: Total Help needed moving to and from a bed to a chair (including a wheelchair)?: Total Help needed standing up from a chair using your arms (e.g., wheelchair or bedside chair)?: Total Help needed to walk in hospital room?: Total Help needed climbing 3-5 steps with a railing? : Total 6 Click Score: 7    End of Session Equipment Utilized During Treatment: Gait belt Activity Tolerance: Patient tolerated treatment well Patient left: with call bell/phone within reach;in chair;with chair alarm set Nurse Communication: Mobility status;Need for lift equipment PT Visit Diagnosis: Muscle weakness (generalized)  (M62.81);Repeated falls (R29.6);Difficulty in walking, not elsewhere classified (R26.2);Unsteadiness on feet (R26.81)     Time: 6226-3335 PT Time Calculation (min) (ACUTE ONLY): 28 min  Charges:  $Therapeutic Activity: 23-37 mins                      Jerolyn Center, PT Pager [phone]    Zena Amos 02/27/2021, 12:44 PM

## 2021-02-27 NOTE — Progress Notes (Signed)
[facility] PROGRESS NOTE  Carolyn Norton QBH:[id number] DOB: 09-Jun-1966 DOA: 01/11/2021 PCP: Elbert Ewings, FNP     Status: Remains inpatient appropriate because:Unsafe d/c plan-due to patient's underlying poor mental status short-term memory deficits she will require 24/7 care after discharge  Dispo:  Patient From:  Home  Planned Disposition: SNF  Medically stable for discharge:  yes  Barriers to DC: Continues to require significant assistance with mobility.  Continues to have significant short-term memory deficits impeding patient's ability to safely return home with elderly parents; please note that the patient's father continues to work outside of the home therefore the burden of care would follow-up on the patient's elderly mother             Difficult to place: Yes  Level of care: Med-Surg  Code Status: DNR Family Communication: 10/10 Mother Carolyn Norton updated DVT prophylaxis: Lovenox COVID vaccination status: Unknown   HPI: 55 year old female past medical history of diabetes mellitus type 2, bipolar disorder type I, previous COVID infection, polysubstance abuse that includes cocaine.  She presented from home where she lives with her parents.  Her presenting symptoms included generalized weakness, multiple falls and confusion.  Family confirmed that she had been having the symptoms for at least a month with last use of crack cocaine 1 month prior to presentation.  Ever since that time she had not been doing well.  She would also be having issues with hyperglycemia.  In the ER she was diagnosed with severe dehydration, hyperglycemia and metabolic encephalopathy.  Since admission patient has been evaluated by the psychiatric team who has deemed her alert and oriented and having capacity to make decisions.  Patient is agreeable to placement at a rehab.  Currently she has severe physical deconditioning which was POA and PT is recommending SNF.  Because of her history of  polysubstance abuse and lack of funding SNF is not an option.  Family is agreeable to taking her back in home when she is able to ambulate well enough to get back and forth for meals into the bathroom.  Subjective: Alert and sitting in bed.  Has difficulty recalling recent history.  Asked her if she felt like her arm movement was keeping her from getting her food into her mouth and that is why she is not eating.  She appeared to think about this for a while but stated "you know I do not really know"  Objective: Vitals:   02/26/21 2010 02/27/21 0504  BP: 126/64 126/65  Pulse: 77 74  Resp: 20 20  Temp: 98 F (36.7 C) 98.2 F (36.8 C)  SpO2: 96% 98%    Intake/Output Summary (Last 24 hours) at 02/27/2021 0715 Last data filed at 02/27/2021 0305 Gross per 24 hour  Intake --  Output 200 ml  Net -200 ml   Filed Weights   01/11/21 1152  Weight: 99.8 kg    Exam:  Constitutional: Pleasant affect.  No acute distress Respiratory: Lungs remain clear.  Stable on room air-documented with coughing episode last night with subsequent wheezing but improved after nebulizer treatment.  Currently no wheezing or increased work of breathing Cardiac: Normal heart sounds, normotensive, no peripheral edema, regular pulse Abdomen: LBM 6/21, soft nontender with normoactive bowel sounds.  Continues with poor oral intake Neurologic: Cranial nerves are grossly intact.  Patient has weak upper and lower extremity strength estimated at 3+-4/5.  She has been observed with bilateral dysmetria and asterixis of upper extremities especially when attempting to eat and manage  eating utensils.  Continues to require 2+ max assist for mobility with walker. Psychiatric: Alert and pleasant but remains confused today she was only oriented to place.    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  Neurology previously noted that she had mild as dexterous and mild bilateral metric dysmetria Patient with low thiamine this admission and has been given high-dose thiamine without improvement in symptoms.  We will repeat anemia panel, B6 and B1 levels Check CRP and ESR (within normal limits) Plan is to discharge to SNF Significantly abnormal cognitive/executive functioning tests consistent with dementia  Ventriculomegaly/? NPH Neurosurgery and neurology have signed off. S/p large volume LP without any significant improvement in status Per neurology and neurosurgery imaging is not a "slam dunk" to definitely confirm the diagnosis of NPH -NPH evaluation is typically done in the outpatient setting per neurosurgeon and neurology Patient will need to follow-up with neurology and neurosurgery after discharge.  Anorexia Protein beverages ordered but patient inconsistently consuming Add Megace 800 mg daily Resume DVT prophylaxis with Lovenox Patient's dysmetria and asterixis may be influencing her ability to eat for have placed order to assist patient with feeding  Abnormal urinalysis/UTI Completed Rocephin x 3 days  Diabetes mellitus 2 with hyperglycemia on insulin Hemoglobin A1c 11.0 New low-dose 70/30 insulin BID Variable intake persists Continue SSI but decreased to TID AC and decreased from sensitive to extra sensitive given poor intake and underlying hypoglycemia  Dyslipidemia Continue statin  History of bipolar 1 disorder/polysubstance abuse/cognitive impairment Continue escitalopram, Neurontin  and Depakote Functional cognitive evaluation with significant abnormalities  Physical deconditioning/bilateral heel cord shortening Continue bilateral PRAFO boots on every 4 hours and off every 4 hours SNF at discharge   Other problems: Acute kidney injury on CKD 3a Acute kidney injury resolved after IV fluids Baseline creatinine 1.3  COPD Continue Singulair, nebs as needed, consider adding LABA  GERD Continue PPI  Hypokalemia/hypomagnesemia Resolved  Data Reviewed: Basic Metabolic Panel: Recent Labs  Lab 02/24/21 0419  NA 138  K 3.8  CL 97*  CO2 32  GLUCOSE 249*  BUN 8  CREATININE 0.95  CALCIUM 9.3   Liver Function Tests: Recent Labs  Lab 02/24/21 0419  AST 15  ALT 14  ALKPHOS 46  BILITOT 0.3  PROT 5.2*  ALBUMIN 2.5*   No results for input(s): LIPASE, AMYLASE in the last 168 hours. No results for input(s): AMMONIA in the last 168 hours. CBC: Recent Labs  Lab 02/24/21 0419  WBC 8.0  HGB 11.2*  HCT 34.0*  MCV 89.2  PLT 195   Cardiac Enzymes: No results for input(s): CKTOTAL, CKMB, CKMBINDEX, TROPONINI in the last 168 hours. BNP (last 3 results) Recent Labs    01/14/21 0238 01/15/21 0230 01/16/21 0201  BNP 177.8* 115.9* 40.9    ProBNP (last 3 results) No results for input(s): PROBNP in the last 8760 hours.  CBG: Recent Labs  Lab 02/26/21 1553 02/26/21 1620 02/26/21 2013 02/26/21 2305 02/27/21 0303  GLUCAP 68* 77 216* 340* 282*    No results found for this or any previous visit (from the past 240  hour(s)).    Studies: No results found.  Scheduled Meds:  atorvastatin  20 mg Oral Daily   cholecalciferol  1,000 Units Oral Daily   divalproex  500 mg Oral BID   enoxaparin (LOVENOX) injection  40 mg Subcutaneous Q24H   escitalopram  20 mg Oral Daily   feeding supplement (GLUCERNA SHAKE)  237 mL Oral TID AC & HS   gabapentin  300 mg Oral BID   insulin aspart  0-6 Units Subcutaneous Q4H   insulin aspart protamine- aspart  2 Units  Subcutaneous Q supper   insulin aspart protamine- aspart  6 Units Subcutaneous Q breakfast   megestrol  800 mg Oral Daily   metFORMIN  1,000 mg Oral BID WC   montelukast  10 mg Oral QHS   pantoprazole  40 mg Oral Daily   senna-docusate  2 tablet Oral Daily   thiamine  100 mg Oral Daily   Continuous Infusions:  vancomycin      Principal Problem:   Bipolar 1 disorder, depressed, full remission ([place]) Active Problems:   Bipolar 1 disorder ([place])   Hypertension   Generalized weakness   Controlled type 2 diabetes mellitus with hyperglycemia, with long-term current use of insulin ([facility])   Hyperlipidemia   NPH (normal pressure hydrocephalus) ([facility])   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   Consultants: Psychiatry Neurology Neurosurgery  Procedures: None  Antibiotics: Anti-infectives (From admission, onward)    Start     Dose/Rate Route Frequency Ordered Stop   02/14/21 0828  vancomycin (VANCOREADY) IVPB 1000 mg/200 mL        1,000 mg 200 mL/hr over 60 Minutes Intravenous 60 min pre-op 02/14/21 0828     02/13/21 2115  cefTRIAXone (ROCEPHIN) 1 g in sodium chloride 0.9 % 100 mL IVPB  Status:  Discontinued        1 g 200 mL/hr over 30 Minutes Intravenous Every 24 hours 02/13/21 2016 02/16/21 0844        Time spent: 25 minutes    Erin Hearing ANP  [facility] 7 am - 330 pm/M-F for direct patient care and secure chat Please refer to Amion for contact info 46  days

## 2021-02-28 LAB — GLUCOSE, CAPILLARY
Glucose-Capillary: 308 mg/dL — ABNORMAL HIGH (ref 70–99)
Glucose-Capillary: 241 mg/dL — ABNORMAL HIGH (ref 70–99)
Glucose-Capillary: 157 mg/dL — ABNORMAL HIGH (ref 70–99)
Glucose-Capillary: 106 mg/dL — ABNORMAL HIGH (ref 70–99)

## 2021-02-28 NOTE — TOC Progression Note (Signed)
Transition of Care [facility]) - Progression Note    Patient Details  Name: Mckinsey Keagle MRN: [id number] Date of Birth: Jan 17, 1966  Transition of Care [facility]) CM/SW Contact  Janae Bridgeman, RN Phone Number: 02/28/2021, 12:42 PM  Clinical Narrative:    Case management called and left a message with [facility] [phone] - the facility is currently reviewing the patient for SNF admission.  Clinicals were sent to the facility yesterday to attention to [facility] to fax # [phone].  CM and MSW will continue to follow the patient for SNF admission - No bed offers at this time.   Expected Discharge Plan: Skilled Nursing Facility Barriers to Discharge: Family Issues, Continued Medical Work up  Expected Discharge Plan and Services Expected Discharge Plan: Skilled Nursing Facility In-house Referral: Clinical Social Work Discharge Planning Services: CM Consult Post Acute Care Choice: Skilled Nursing Facility Living arrangements for the past 2 months: Single Family Home Expected Discharge Date: 01/16/21               DME Arranged: [name] rolling DME Agency: [employer] Date DME Agency Contacted: 01/20/21 Time DME Agency Contacted: 1435 Representative spoke with at DME Agency: [name]             Social Determinants of Health (SDOH) Interventions    Readmission Risk Interventions Readmission Risk Prevention Plan 01/14/2021  Transportation Screening Complete  PCP or Specialist Appt within 3-5 Days (No Data)  Social Work Consult for Recovery Care Planning/Counseling Complete  Palliative Care Screening Not Applicable  Medication Review [facility]) Referral to Pharmacy  Some recent data might be hidden

## 2021-02-28 NOTE — Progress Notes (Signed)
[facility] PROGRESS NOTE  Carolyn Norton WPV:[id number] DOB: July 12, 1966 DOA: 01/11/2021 PCP: Elbert Ewings, FNP     Status: Remains inpatient appropriate because:Unsafe d/c plan-due to patient's underlying poor mental status short-term memory deficits she will require 24/7 care after discharge  Dispo:  Patient From:  Home  Planned Disposition: SNF  Medically stable for discharge:  yes  Barriers to DC: Continues to require significant assistance with mobility.  Continues to have significant short-term memory deficits impeding patient's ability to safely return home with elderly parents; please note that the patient's father continues to work outside of the home therefore the burden of care would follow-up on the patient's elderly mother             Difficult to place: Yes  Level of care: Med-Surg  Code Status: DNR Family Communication: 34/10 Mother Carolyn Norton updated DVT prophylaxis: Lovenox COVID vaccination status: Unknown   HPI: 55 year old female past medical history of diabetes mellitus type 2, bipolar disorder type I, previous COVID infection, polysubstance abuse that includes cocaine.  She presented from home where she lives with her parents.  Her presenting symptoms included generalized weakness, multiple falls and confusion.  Family confirmed that she had been having the symptoms for at least a month with last use of crack cocaine 1 month prior to presentation.  Ever since that time she had not been doing well.  She would also be having issues with hyperglycemia.  In the ER she was diagnosed with severe dehydration, hyperglycemia and metabolic encephalopathy.  Since admission patient has been evaluated by the psychiatric team who has deemed her alert and oriented and having capacity to make decisions.  Patient is agreeable to placement at a rehab.  Currently she has severe physical deconditioning which was POA and PT is recommending SNF.  Because of her history of  polysubstance abuse and lack of funding SNF is not an option.  Family is agreeable to taking her back in home when she is able to ambulate well enough to get back and forth for meals into the bathroom.  Subjective: Sitting up in bed.  Pleasant.  Cannot remember what she ate for breakfast or if staff members fed her.  Objective: Vitals:   02/27/21 2036 02/28/21 0422  BP: 140/70 (!) 122/99  Pulse: 90 88  Resp: 18 16  Temp: 97.8 F (36.6 C) 97.8 F (36.6 C)  SpO2: 94% 91%    Intake/Output Summary (Last 24 hours) at 02/28/2021 7482 Last data filed at 02/27/2021 1736 Gross per 24 hour  Intake 150 ml  Output --  Net 150 ml   Filed Weights   01/11/21 1152  Weight: 99.8 kg    Exam:  Constitutional: Pleasant and in no acute distress Respiratory: Lungs remain clear without wheezing, stable on room air with normal pulse oximetry readings Cardiac: Normal heart sounds, normotensive, no peripheral edema. Abdomen: LBM 6/21, is with poor oral intake, abdominal exam unremarkable with no reproducible pain.  Normal bowel sounds Neurologic: Cranial nerves are grossly intact.  Patient has weak upper and lower extremity strength estimated at 3+-4/5.  She has been observed with bilateral dysmetria and asterixis of upper extremities especially when attempting to eat and manage eating utensils.  Continues to require 2+ max assist for mobility with walker. Psychiatric: Alert and completely disoriented.  Knows name and occasionally place.  Has no safety awareness.    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  Neurology previously noted that she had mild as dexterous and mild bilateral metric dysmetria Low thiamine this admission -s/p high-dose thiamine without improvement in symptoms. Repeat B6 and B1 levels pending Check CRP and ESR (within normal limits) Plan is to discharge to SNF Significantly abnormal cognitive/executive functioning tests consistent with dementia  Ventriculomegaly/? NPH Neurosurgery and neurology have signed off. S/p large volume LP without any significant improvement in status NPH evaluation is typically done in the outpatient setting per neurosurgeon and neurology can be accomplished after discharge  Anorexia Protein beverages ordered but patient inconsistently consuming Continue Megace 800 mg daily with daily Lovenox Patient's dysmetria and asterixis may be influencing her ability to eat for have placed order to assist patient with feeding  Abnormal urinalysis/UTI Completed Rocephin x 3 days  Diabetes mellitus 2 with hyperglycemia on insulin Hemoglobin A1c 11.0 New low-dose 70/30 insulin BID Variable intake persists Continue extra sensitive SSI TID AC   Dyslipidemia Continue statin  History of bipolar 1 disorder/polysubstance abuse/cognitive impairment Continue escitalopram, Neurontin and Depakote Functional cognitive evaluation with significant abnormalities  Physical deconditioning/bilateral heel cord shortening Continue bilateral PRAFO boots on every 4 hours and off every 4 hours SNF at discharge   Other problems: Acute kidney injury on CKD 3a Acute kidney injury resolved after IV fluids Baseline creatinine 1.3  COPD Continue Singulair, nebs as needed, consider adding LABA  GERD Continue PPI  Hypokalemia/hypomagnesemia Resolved  Data Reviewed: Basic Metabolic Panel: Recent Labs  Lab 02/24/21 0419 02/27/21 0716  NA 138 135  K 3.8 4.0  CL 97* 95*   CO2 32 33*  GLUCOSE 249* 287*  BUN 8 11  CREATININE 0.95 0.94  CALCIUM 9.3 9.3   Liver Function Tests: Recent Labs  Lab 02/24/21 0419 02/27/21 0716  AST 15 14*  ALT 14 12  ALKPHOS 46 45  BILITOT 0.3 0.5  PROT 5.2* 5.7*  ALBUMIN 2.5* 2.7*   No results for input(s): LIPASE, AMYLASE in the last 168 hours. No results for input(s): AMMONIA in the last 168 hours. CBC: Recent Labs  Lab 02/24/21 0419 02/27/21 0716  WBC 8.0 7.7  NEUTROABS  --  3.0  HGB 11.2* 13.1  HCT 34.0* 39.2  MCV 89.2 87.7  PLT 195 194   Cardiac Enzymes: No results for input(s): CKTOTAL, CKMB, CKMBINDEX, TROPONINI in the last 168 hours. BNP (last 3 results) Recent Labs    01/14/21 0238 01/15/21 0230 01/16/21 0201  BNP 177.8* 115.9* 40.9    ProBNP (last 3 results) No results for input(s): PROBNP in the last 8760 hours.  CBG: Recent Labs  Lab 02/27/21 1133 02/27/21 1544 02/27/21 2033 02/27/21 2243 02/28/21 0746  GLUCAP 298* 169* 162* 161* 308*    No results found for this or any previous visit (from the past 240 hour(s)).    Studies: No results found.  Scheduled Meds:  atorvastatin  20 mg Oral Daily   cholecalciferol  1,000 Units Oral Daily   divalproex  500 mg Oral BID   enoxaparin (LOVENOX) injection  40 mg Subcutaneous Q24H   escitalopram  20 mg Oral Daily   feeding supplement (GLUCERNA SHAKE)  237 mL Oral TID AC & HS   gabapentin  300 mg Oral BID   insulin aspart  0-15 Units Subcutaneous TID WC   insulin aspart protamine- aspart  2 Units Subcutaneous Q supper   insulin aspart protamine- aspart  6 Units Subcutaneous Q breakfast   megestrol  800 mg Oral Daily   metFORMIN  1,000 mg Oral BID WC   montelukast  10 mg Oral QHS   pantoprazole  40 mg Oral Daily   senna-docusate  2 tablet Oral Daily   thiamine  100 mg Oral Daily   Continuous Infusions:  vancomycin      Principal Problem:   Bipolar 1 disorder, depressed, full remission ([place]) Active Problems:   Bipolar 1  disorder ([place])   Hypertension   Generalized weakness   Controlled type 2 diabetes mellitus with hyperglycemia, with long-term current use of insulin ([facility])   Hyperlipidemia   NPH (normal pressure hydrocephalus) ([facility])   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   Consultants: Psychiatry Neurology Neurosurgery  Procedures: None  Antibiotics: Anti-infectives (From admission, onward)    Start     Dose/Rate Route Frequency Ordered Stop   02/14/21 0828  vancomycin (VANCOREADY) IVPB 1000 mg/200 mL        1,000 mg 200 mL/hr over 60 Minutes Intravenous 60 min pre-op 02/14/21 0828     02/13/21 2115  cefTRIAXone (ROCEPHIN) 1 g in sodium chloride 0.9 % 100 mL IVPB  Status:  Discontinued        1 g 200 mL/hr over 30 Minutes Intravenous Every 24 hours 02/13/21 2016 02/16/21 0844        Time spent: 25 minutes    Erin Hearing ANP  [facility] 7 am - 330 pm/M-F for direct patient care and secure chat Please refer to Amion for contact info 47  days

## 2021-03-01 LAB — GLUCOSE, CAPILLARY
Glucose-Capillary: 390 mg/dL — ABNORMAL HIGH (ref 70–99)
Glucose-Capillary: 259 mg/dL — ABNORMAL HIGH (ref 70–99)
Glucose-Capillary: 170 mg/dL — ABNORMAL HIGH (ref 70–99)
Glucose-Capillary: 130 mg/dL — ABNORMAL HIGH (ref 70–99)

## 2021-03-01 MED ORDER — INSULIN ASPART PROT & ASPART (70-30 MIX) 100 UNIT/ML ~~LOC~~ SUSP
10.0000 [IU] | Freq: Two times a day (BID) | SUBCUTANEOUS | Status: DC
  Administered 2021-03-01 – 2021-03-02 (×2): 10 [IU] via SUBCUTANEOUS

## 2021-03-01 NOTE — Progress Notes (Signed)
[facility] PROGRESS NOTE  Simrat Kendrick HYW:[id number] DOB: [birth year] DOA: 01/11/2021 PCP: Elbert Ewings, FNP     Status: Remains inpatient appropriate because:Unsafe d/c plan-due to patient's underlying poor mental status short-term memory deficits she will require 24/7 care after discharge  Dispo:  Patient From:  Home  Planned Disposition: SNF  Medically stable for discharge:  yes  Barriers to DC: Continues to require significant assistance with mobility.  Continues to have significant short-term memory deficits impeding patient's ability to safely return home with elderly parents; please note that the patient's father continues to work outside of the home therefore the burden of care would follow-up on the patient's elderly mother             Difficult to place: Yes  Level of care: Med-Surg  Code Status: DNR Family Communication: 72/10 Mother [name] updated DVT prophylaxis: Lovenox COVID vaccination status: Unknown   HPI: 55 year old female past medical history of diabetes mellitus type 2, bipolar disorder type I, previous COVID infection, polysubstance abuse that includes cocaine.  She presented from home where she lives with her parents.  Her presenting symptoms included generalized weakness, multiple falls and confusion.  Family confirmed that she had been having the symptoms for at least a month with last use of crack cocaine 1 month prior to presentation.  Ever since that time she had not been doing well.  She would also be having issues with hyperglycemia.  In the ER she was diagnosed with severe dehydration, hyperglycemia and metabolic encephalopathy.  Since admission patient has been evaluated by the psychiatric team who has deemed her alert and oriented and having capacity to make decisions.  Patient is agreeable to placement at a rehab.  Currently she has severe physical deconditioning which was POA and PT is recommending SNF.  Because of her history of  polysubstance abuse and lack of funding SNF is not an option.  Family is agreeable to taking her back in home when she is able to ambulate well enough to get back and forth for meals into the bathroom.  Subjective: Pt reported doing ok.    Objective: Vitals:   03/01/21 0522 03/01/21 1445  BP: (!) 165/87 (!) 150/90  Pulse: 89 (!) 101  Resp: 18   Temp: 97.8 F (36.6 C) 98.8 F (37.1 C)  SpO2: 93% 97%    Intake/Output Summary (Last 24 hours) at 03/01/2021 1531 Last data filed at 03/01/2021 0500 Gross per 24 hour  Intake --  Output 425 ml  Net -425 ml   Filed Weights   01/11/21 1152  Weight: 99.8 kg    Exam:  Constitutional: NAD, alert HEENT: conjunctivae and lids normal, EOMI CV: No cyanosis.   RESP: normal respiratory effort, on RA Extremities: No effusions, edema in BLE SKIN: warm, dry Neuro: II - XII grossly intact.     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  Neurology previously noted that she had mild as dexterous and mild bilateral metric dysmetria Low thiamine this admission -s/p high-dose thiamine without improvement in symptoms. Repeat B6 and B1 levels pending Check CRP and ESR (within normal limits) Plan is  to discharge to SNF Significantly abnormal cognitive/executive functioning tests consistent with dementia  Ventriculomegaly/? NPH Neurosurgery and neurology have signed off. S/p large volume LP without any significant improvement in status NPH evaluation is typically done  in the outpatient setting per neurosurgeon and neurology can be accomplished after discharge  Anorexia Protein beverages ordered but patient inconsistently consuming Continue Megace 800 mg daily with daily Lovenox Patient's dysmetria and asterixis may be influencing her ability to eat for have placed order to assist patient with feeding  Abnormal urinalysis/UTI Completed Rocephin x 3 days  Diabetes mellitus 2 with hyperglycemia on insulin Hemoglobin A1c 11.0 Variable intake persists --increase 70/30 to 10u BID --cont SSI TID mod scale  Dyslipidemia Continue statin  History of bipolar 1 disorder/polysubstance abuse/cognitive impairment Continue escitalopram, Neurontin and Depakote Functional cognitive evaluation with significant abnormalities  Physical deconditioning/bilateral heel cord shortening Continue bilateral PRAFO boots on every 4 hours and off every 4 hours SNF at discharge   Other problems: Acute kidney injury on CKD 3a Acute kidney injury resolved after IV fluids Baseline creatinine 1.3  COPD Continue Singulair, nebs as needed, consider adding LABA  GERD Continue PPI  Hypokalemia/hypomagnesemia Resolved  Data Reviewed: Basic Metabolic Panel: Recent Labs  Lab 02/24/21 0419 02/27/21 0716  NA 138 135  K 3.8 4.0  CL 97* 95*  CO2 32 33*  GLUCOSE 249* 287*  BUN 8 11  CREATININE 0.95 0.94  CALCIUM 9.3 9.3   Liver Function Tests: Recent Labs  Lab 02/24/21 0419 02/27/21 0716  AST 15 14*  ALT 14 12  ALKPHOS 46 45  BILITOT 0.3 0.5  PROT 5.2* 5.7*  ALBUMIN 2.5* 2.7*   No results for input(s): LIPASE, AMYLASE in the last 168 hours. No results for input(s): AMMONIA in the last 168 hours. CBC: Recent Labs  Lab 02/24/21 0419 02/27/21 0716  WBC 8.0 7.7  NEUTROABS  --  3.0  HGB 11.2* 13.1  HCT 34.0* 39.2  MCV 89.2 87.7  PLT 195 194   Cardiac Enzymes: No results for input(s): CKTOTAL, CKMB, CKMBINDEX, TROPONINI in the last 168 hours. BNP  (last 3 results) Recent Labs    01/14/21 0238 01/15/21 0230 01/16/21 0201  BNP 177.8* 115.9* 40.9    ProBNP (last 3 results) No results for input(s): PROBNP in the last 8760 hours.  CBG: Recent Labs  Lab 02/28/21 1143 02/28/21 1726 02/28/21 2031 03/01/21 0732 03/01/21 1136  GLUCAP 241* 157* 106* 390* 259*    No results found for this or any previous visit (from the past 240 hour(s)).    Studies: No results found.  Scheduled Meds:  atorvastatin  20 mg Oral Daily   cholecalciferol  1,000 Units Oral Daily   divalproex  500 mg Oral BID   enoxaparin (LOVENOX) injection  40 mg Subcutaneous Q24H   escitalopram  20 mg Oral Daily   feeding supplement (GLUCERNA SHAKE)  237 mL Oral TID AC & HS   gabapentin  300 mg Oral BID   insulin aspart  0-15 Units Subcutaneous TID WC   insulin aspart protamine- aspart  10 Units Subcutaneous BID WC   megestrol  800 mg Oral Daily   metFORMIN  1,000 mg Oral BID WC   montelukast  10 mg Oral QHS   pantoprazole  40 mg Oral Daily   senna-docusate  2 tablet Oral Daily   thiamine  100 mg Oral Daily   Continuous Infusions:  vancomycin      Principal Problem:   Bipolar 1 disorder, depressed,  full remission ([place]) Active Problems:   Bipolar 1 disorder ([place])   Hypertension   Generalized weakness   Controlled type 2 diabetes mellitus with hyperglycemia, with long-term current use of insulin ([facility])   Hyperlipidemia   NPH (normal pressure hydrocephalus) ([facility])   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   Consultants: Psychiatry Neurology Neurosurgery  Procedures: None  Antibiotics: Anti-infectives (From admission, onward)    Start     Dose/Rate Route Frequency Ordered Stop   02/14/21 0828  vancomycin (VANCOREADY) IVPB 1000 mg/200 mL        1,000 mg 200 mL/hr over 60 Minutes Intravenous 60 min pre-op 02/14/21 0828     02/13/21 2115  cefTRIAXone (ROCEPHIN) 1 g in sodium chloride 0.9 % 100 mL IVPB  Status:  Discontinued        1 g 200 mL/hr  over 30 Minutes Intravenous Every 24 hours 02/13/21 2016 02/16/21 0844         Enzo Bi MD  [facility] 7 am - 330 pm/M-F for direct patient care and secure chat Please refer to Advance for contact info 48  days

## 2021-03-02 LAB — GLUCOSE, CAPILLARY
Glucose-Capillary: 374 mg/dL — ABNORMAL HIGH (ref 70–99)
Glucose-Capillary: 195 mg/dL — ABNORMAL HIGH (ref 70–99)
Glucose-Capillary: 99 mg/dL (ref 70–99)
Glucose-Capillary: 105 mg/dL — ABNORMAL HIGH (ref 70–99)

## 2021-03-02 MED ORDER — INSULIN ASPART PROT & ASPART (70-30 MIX) 100 UNIT/ML ~~LOC~~ SUSP: 15.0000 [IU] | Freq: Two times a day (BID) | SUBCUTANEOUS | Status: DC

## 2021-03-02 NOTE — Progress Notes (Signed)
Pt is having a hard time swallowing and she is pocketing her food and meds. Notified Darlin Priestly, MD.

## 2021-03-02 NOTE — Progress Notes (Signed)
[facility] PROGRESS NOTE  Carolyn Norton MRN:[id number] DOB: 03/03/1966 DOA: 01/11/2021 PCP: Steele, Anthony, FNP     Status: Remains inpatient appropriate because:Unsafe d/c plan-due to patient's underlying poor mental status short-term memory deficits she will require 24/7 care after discharge  Dispo:  Patient From:  Home  Planned Disposition: SNF  Medically stable for discharge:  yes  Barriers to DC: Continues to require significant assistance with mobility.  Continues to have significant short-term memory deficits impeding patient's ability to safely return home with elderly parents; please note that the patient's father continues to work outside of the home therefore the burden of care would follow-up on the patient's elderly mother             Difficult to place: Yes  Level of care: Med-Surg  Code Status: DNR Family Communication: 6/10 Mother Cynthia updated DVT prophylaxis: Lovenox COVID vaccination status: Unknown   HPI: 55-year-old female past medical history of diabetes mellitus type 2, bipolar disorder type I, previous COVID infection, polysubstance abuse that includes cocaine.  She presented from home where she lives with her parents.  Her presenting symptoms included generalized weakness, multiple falls and confusion.  Family confirmed that she had been having the symptoms for at least a month with last use of crack cocaine 1 month prior to presentation.  Ever since that time she had not been doing well.  She would also be having issues with hyperglycemia.  In the ER she was diagnosed with severe dehydration, hyperglycemia and metabolic encephalopathy.  Since admission patient has been evaluated by the psychiatric team who has deemed her alert and oriented and having capacity to make decisions.  Patient is agreeable to placement at a rehab.  Currently she has severe physical deconditioning which was POA and PT is recommending SNF.  Because of her history of  polysubstance abuse and lack of funding SNF is not an option.  Family is agreeable to taking her back in home when she is able to ambulate well enough to get back and forth for meals into the bathroom.  Subjective: Not able to get meaning ROS since pt answered "yes" to all questions.  Per nursing, pt has not been eating well.   Objective: Vitals:   03/02/21 0705 03/02/21 1351  BP:  (!) 146/72  Pulse:  86  Resp:  18  Temp:  98.3 F (36.8 C)  SpO2: 94% 95%    Intake/Output Summary (Last 24 hours) at 03/02/2021 1531 Last data filed at 03/02/2021 1430 Gross per 24 hour  Intake 50 ml  Output 350 ml  Net -300 ml   Filed Weights   01/11/21 1152  Weight: 99.8 kg    Exam:  Constitutional: NAD, alert, not oriented HEENT: conjunctivae and lids normal, EOMI CV: No cyanosis.   RESP: normal respiratory effort, on RA Extremities: No effusions, edema in BLE SKIN: warm, dry Neuro: II - XII grossly intact.     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  Neurology previously noted that she had mild as dexterous and mild bilateral metric dysmetria Low thiamine this admission -s/p high-dose thiamine without improvement in symptoms.   Repeat B6 and B1 levels pending Check CRP and ESR (within normal limits) Plan is to discharge to SNF Significantly abnormal cognitive/executive functioning tests consistent with dementia  Ventriculomegaly/? NPH Neurosurgery and neurology have signed off. S/p large volume LP  without any significant improvement in status NPH evaluation is typically done in the outpatient setting per neurosurgeon and neurology can be accomplished after discharge  Anorexia Protein beverages ordered but patient inconsistently consuming Continue Megace 800 mg daily with daily Lovenox Patient's dysmetria and asterixis may be influencing her ability to eat for have placed order to assist patient with feeding  Abnormal urinalysis/UTI Completed Rocephin x 3 days  Diabetes mellitus 2 with hyperglycemia on insulin Hemoglobin A1c 11.0 Variable intake persists --cont 70/30 10u BID with meals --cont SSI TID mod scale  Dyslipidemia Continue statin  History of bipolar 1 disorder/polysubstance abuse/cognitive impairment Continue escitalopram, Neurontin and Depakote Functional cognitive evaluation with significant abnormalities  Physical deconditioning/bilateral heel cord shortening Continue bilateral PRAFO boots on every 4 hours and off every 4 hours SNF at discharge   Other problems: Acute kidney injury on CKD 3a Acute kidney injury resolved after IV fluids Baseline creatinine 1.3  COPD Continue Singulair, nebs as needed, consider adding LABA  GERD Continue PPI  Hypokalemia/hypomagnesemia Resolved  Data Reviewed: Basic Metabolic Panel: Recent Labs  Lab 02/24/21 0419 02/27/21 0716  NA 138 135  K 3.8 4.0  CL 97* 95*  CO2 32 33*  GLUCOSE 249* 287*  BUN 8 11  CREATININE 0.95 0.94  CALCIUM 9.3 9.3   Liver Function Tests: Recent Labs  Lab 02/24/21 0419 02/27/21 0716  AST 15 14*  ALT 14 12  ALKPHOS 46 45  BILITOT 0.3 0.5  PROT 5.2* 5.7*  ALBUMIN 2.5* 2.7*   No results for input(s): LIPASE, AMYLASE in the last 168 hours. No results for input(s): AMMONIA in the last 168 hours. CBC: Recent Labs  Lab 02/24/21 0419 02/27/21 0716  WBC 8.0 7.7  NEUTROABS  --  3.0  HGB 11.2* 13.1  HCT 34.0* 39.2  MCV 89.2 87.7  PLT 195 194   Cardiac Enzymes: No  results for input(s): CKTOTAL, CKMB, CKMBINDEX, TROPONINI in the last 168 hours. BNP (last 3 results) Recent Labs    01/14/21 0238 01/15/21 0230 01/16/21 0201  BNP 177.8* 115.9* 40.9    ProBNP (last 3 results) No results for input(s): PROBNP in the last 8760 hours.  CBG: Recent Labs  Lab 03/01/21 1136 03/01/21 1607 03/01/21 2022 03/02/21 1003 03/02/21 1353  GLUCAP 259* 170* 130* 374* 195*    No results found for this or any previous visit (from the past 240 hour(s)).    Studies: No results found.  Scheduled Meds:  atorvastatin  20 mg Oral Daily   cholecalciferol  1,000 Units Oral Daily   divalproex  500 mg Oral BID   enoxaparin (LOVENOX) injection  40 mg Subcutaneous Q24H   escitalopram  20 mg Oral Daily   feeding supplement (GLUCERNA SHAKE)  237 mL Oral TID AC & HS   gabapentin  300 mg Oral BID   insulin aspart  0-15 Units Subcutaneous TID WC   insulin aspart protamine- aspart  15 Units Subcutaneous BID WC   megestrol  800 mg Oral Daily   metFORMIN  1,000 mg Oral BID WC   montelukast  10 mg Oral QHS   pantoprazole  40 mg Oral Daily   senna-docusate  2 tablet Oral Daily   thiamine  100 mg Oral Daily   Continuous  Infusions:    Principal Problem:   Bipolar 1 disorder, depressed, full remission (HCC) Active Problems:   Bipolar 1 disorder (HCC)   Hypertension   Generalized weakness   Controlled type 2 diabetes mellitus with hyperglycemia, with long-term current use of insulin (HCC)   Hyperlipidemia   NPH (normal pressure hydrocephalus) (HCC)   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   Consultants: Psychiatry Neurology Neurosurgery  Procedures: None  Antibiotics: Anti-infectives (From admission, onward)    Start     Dose/Rate Route Frequency Ordered Stop   02/14/21 0828  vancomycin (VANCOREADY) IVPB 1000 mg/200 mL  Status:  Discontinued        1,000 mg 200 mL/hr over 60 Minutes Intravenous 60 min pre-op 02/14/21 0828 03/02/21 1139   02/13/21 2115   cefTRIAXone (ROCEPHIN) 1 g in sodium chloride 0.9 % 100 mL IVPB  Status:  Discontinued        1 g 200 mL/hr over 30 Minutes Intravenous Every 24 hours 02/13/21 2016 02/16/21 0844           MD  [facility] 7 am - 330 pm/M-F for direct patient care and secure chat Please refer to Amion for contact info 49  days 

## 2021-03-03 LAB — COMPREHENSIVE METABOLIC PANEL
ALT: 14 U/L (ref 0–44)
AST: 14 U/L — ABNORMAL LOW (ref 15–41)
Albumin: 3 g/dL — ABNORMAL LOW (ref 3.5–5.0)
Alkaline Phosphatase: 48 U/L (ref 38–126)
Anion gap: 18 — ABNORMAL HIGH (ref 5–15)
BUN: 20 mg/dL (ref 6–20)
CO2: 21 mmol/L — ABNORMAL LOW (ref 22–32)
Calcium: 9.1 mg/dL (ref 8.9–10.3)
Chloride: 92 mmol/L — ABNORMAL LOW (ref 98–111)
Creatinine, Ser: 1.38 mg/dL — ABNORMAL HIGH (ref 0.44–1.00)
GFR, Estimated: 46 mL/min — ABNORMAL LOW (ref >60)
Glucose, Bld: 513 mg/dL (ref 70–99)
Potassium: 5 mmol/L (ref 3.5–5.1)
Sodium: 131 mmol/L — ABNORMAL LOW (ref 135–145)
Total Bilirubin: 1.1 mg/dL (ref 0.3–1.2)
Total Protein: 6.5 g/dL (ref 6.5–8.1)

## 2021-03-03 LAB — CBC WITH DIFFERENTIAL/PLATELET
Abs Immature Granulocytes: 0.06 10*3/uL (ref 0.00–0.07)
Basophils Absolute: 0.1 10*3/uL (ref 0.0–0.1)
Basophils Relative: 1 %
Eosinophils Absolute: 0.1 10*3/uL (ref 0.0–0.5)
Eosinophils Relative: 1 %
HCT: 40.1 % (ref 36.0–46.0)
Hemoglobin: 13.8 g/dL (ref 12.0–15.0)
Immature Granulocytes: 1 %
Lymphocytes Relative: 25 %
Lymphs Abs: 2.8 10*3/uL (ref 0.7–4.0)
MCH: 30.1 pg (ref 26.0–34.0)
MCHC: 34.4 g/dL (ref 30.0–36.0)
MCV: 87.4 fL (ref 80.0–100.0)
Monocytes Absolute: 1 10*3/uL (ref 0.1–1.0)
Monocytes Relative: 9 %
Neutro Abs: 7.1 10*3/uL (ref 1.7–7.7)
Neutrophils Relative %: 63 %
Platelets: 207 10*3/uL (ref 150–400)
RBC: 4.59 MIL/uL (ref 3.87–5.11)
RDW: 14.8 % (ref 11.5–15.5)
WBC: 11.1 10*3/uL — ABNORMAL HIGH (ref 4.0–10.5)
nRBC: 0 % (ref 0.0–0.2)

## 2021-03-03 LAB — GLUCOSE, CAPILLARY
Glucose-Capillary: 497 mg/dL — ABNORMAL HIGH (ref 70–99)
Glucose-Capillary: 527 mg/dL (ref 70–99)
Glucose-Capillary: 345 mg/dL — ABNORMAL HIGH (ref 70–99)
Glucose-Capillary: 307 mg/dL — ABNORMAL HIGH (ref 70–99)
Glucose-Capillary: 92 mg/dL (ref 70–99)
Glucose-Capillary: 52 mg/dL — ABNORMAL LOW (ref 70–99)
Glucose-Capillary: 68 mg/dL — ABNORMAL LOW (ref 70–99)

## 2021-03-03 LAB — URINALYSIS, ROUTINE W REFLEX MICROSCOPIC
Bacteria, UA: NONE SEEN
Bilirubin Urine: NEGATIVE
Glucose, UA: >=500 mg/dL — AB
Hgb urine dipstick: NEGATIVE
Ketones, ur: 80 mg/dL — AB
Nitrite: NEGATIVE
Protein, ur: NEGATIVE mg/dL
Specific Gravity, Urine: 1.022 (ref 1.005–1.030)
pH: 5 (ref 5.0–8.0)

## 2021-03-03 LAB — VITAMIN B1: Vitamin B1 (Thiamine): 259.2 nmol/L — ABNORMAL HIGH (ref 66.5–200.0)

## 2021-03-03 MED ORDER — INSULIN ASPART PROT & ASPART (70-30 MIX) 100 UNIT/ML ~~LOC~~ SUSP
10.0000 [IU] | Freq: Two times a day (BID) | SUBCUTANEOUS | Status: DC
  Administered 2021-03-03: 10 [IU] via SUBCUTANEOUS

## 2021-03-03 MED ORDER — MAGIC MOUTHWASH W/LIDOCAINE
10.0000 mL | Freq: Four times a day (QID) | ORAL | Status: DC
  Administered 2021-03-03 – 2021-03-13 (×29): 10 mL via ORAL
  Administered 2021-03-13: 5 mL via ORAL
  Administered 2021-03-13 – 2021-03-14 (×5): 10 mL via ORAL
  Filled 2021-03-03 (×31): qty 10

## 2021-03-03 MED ORDER — INSULIN ASPART 100 UNIT/ML IJ SOLN
0.0000 [IU] | INTRAMUSCULAR | Status: DC
Start: 2021-03-03 — End: 2021-03-04
  Administered 2021-03-03: 11 [IU] via SUBCUTANEOUS
  Administered 2021-03-04: 15 [IU] via SUBCUTANEOUS

## 2021-03-03 MED ORDER — SODIUM CHLORIDE 0.9 % IV SOLN: INTRAVENOUS | Status: DC

## 2021-03-03 NOTE — Progress Notes (Signed)
CSW attempted to reach Carolyn Norton with [facility] - no answer, a voicemail was left requesting a return call.  CSW sent patient's clinical information to [facility] for review.  Edwin Dada, MSW, LCSW Transitions of Care  Clinical Social Worker II [phone]

## 2021-03-03 NOTE — Progress Notes (Signed)
[facility] PROGRESS NOTE  Carolyn Norton OIZ:[id number] DOB: 06/16/1966 DOA: 01/11/2021 PCP: Elbert Ewings, FNP     Status: Remains inpatient appropriate because:Unsafe d/c plan-due to patient's underlying poor mental status short-term memory deficits she will require 24/7 care after discharge  Dispo:  Patient From:  Home  Planned Disposition: SNF  Medically stable for discharge:  yes  Barriers to DC: Continues to require significant assistance with mobility.  Continues to have significant short-term memory deficits impeding patient's ability to safely return home with elderly parents; please note that the patient's father continues to work outside of the home therefore the burden of care would follow-up on the patient's elderly mother             Difficult to place: Yes  Level of care: Med-Surg  Code Status: DNR Family Communication: 69/10 Mother [name] updated DVT prophylaxis: Lovenox COVID vaccination status: Unknown   HPI: 55 year old female past medical history of diabetes mellitus type 2, bipolar disorder type I, previous COVID infection, polysubstance abuse that includes cocaine.  She presented from home where she lives with her parents.  Her presenting symptoms included generalized weakness, multiple falls and confusion.  Family confirmed that she had been having the symptoms for at least a month with last use of crack cocaine 1 month prior to presentation.  Ever since that time she had not been doing well.  She would also be having issues with hyperglycemia.  In the ER she was diagnosed with severe dehydration, hyperglycemia and metabolic encephalopathy.  Since admission patient has been evaluated by the psychiatric team who has deemed her alert and oriented and having capacity to make decisions.  Patient is agreeable to placement at a rehab.  Currently she has severe physical deconditioning which was POA and PT is recommending SNF.  Because of her history of  polysubstance abuse and lack of funding SNF is not an option.  Family is agreeable to taking her back in home when she is able to ambulate well enough to get back and forth for meals into the bathroom.  Subjective: Alert but having difficulty following even simple commands consistently.  Did not remember reporting difficulty swallowing  Objective: Vitals:   03/02/21 2106 03/03/21 0428  BP: (!) 156/82 134/86  Pulse: (!) 102 96  Resp: 20 19  Temp: 98.4 F (36.9 C) 97.8 F (36.6 C)  SpO2: 96%     Intake/Output Summary (Last 24 hours) at 03/03/2021 0739 Last data filed at 03/02/2021 1430 Gross per 24 hour  Intake --  Output 100 ml  Net -100 ml   Filed Weights   01/11/21 1152  Weight: 99.8 kg    Exam:  Constitutional: Awake, appears to be having oral discomfort. ENT: Reddened tongue with scattered yellow papular lesions, no changes consistent with Candida Respiratory: Lungs clear, room air, normal pulse oximetry Cardiac: Normotensive, no peripheral edema, regular pulse, normal heart sounds Abdomen: LBM 6/21, Neurologic: Cranial nerves are grossly intact.  Patient has weak upper and lower extremity strength estimated at 3+-4/5. Bilateral dysmetria and asterixis of upper extremities.Continues to require 2+ max assist for mobility with walker. 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  Neurology previously noted that she had mild as dexterous and mild bilateral metric dysmetria Low  thiamine this admission -s/p high-dose thiamine without improvement in symptoms. Repeat B6 and B1 levels pending Check CRP and ESR (within normal limits) Plan is to discharge to SNF Significantly abnormal cognitive/executive functioning tests consistent with dementia  Ventriculomegaly/? NPH Neurosurgery and neurology have signed off. S/p large volume LP without any significant improvement in status NPH evaluation is typically done in the outpatient setting per neurosurgeon and neurology can be accomplished after discharge  Anorexia/Odynophagia Protein beverages ordered but patient inconsistently consuming Continue Megace 800 mg daily with daily Lovenox Patient's dysmetria and asterixis may be influencing her ability to eat for have placed order to assist patient with feeding 6/26 pt reported to RN difficulty swallowing and was noted to be pocketing food and meds-SLP eval for possible MBSS ordered. Tongue with significant redness and yellow papular lesions-begin Magic mouthwash 4 times daily During hospitalization vitamin B1 and B12 within normal ranges-neurology documented B1 low from a neurological standpoint    Abnormal urinalysis/UTI Completed Rocephin x 3 days  Diabetes mellitus 2 with hyperglycemia on insulin Hemoglobin A1c 11.0 Poor oral intake that is multifactorial therefore will discontinue long-acting insulins including 70/30-can change to low dose Lantus once intake improves Follow CBG every 4 hours and provide moderate SSI and adjust coverage as indicated 6/27 patient with recurrent hyperglycemia with CBGs as high as 500.  In the past this occurred because of an infectious process/UTI Check urinalysis and culture.  She is also dehydrated with pseudohyponatremia -begin IV fluids at 100 cc/h Plan is to utilize pure wick only at bedtime with orders written  Dyslipidemia Continue statin  History of bipolar 1 disorder/polysubstance abuse/cognitive impairment Continue  escitalopram, Neurontin and Depakote Functional cognitive evaluation with significant abnormalities  Physical deconditioning/bilateral heel cord shortening Continue bilateral PRAFO boots on every 4 hours and off every 4 hours SNF at discharge   Other problems: Acute kidney injury on CKD 3a Acute kidney injury resolved after IV fluids Baseline creatinine 1.3  COPD Continue Singulair, nebs as needed, consider adding LABA  GERD Continue PPI  Hypokalemia/hypomagnesemia Resolved  Data Reviewed: Basic Metabolic Panel: Recent Labs  Lab 02/27/21 0716  NA 135  K 4.0  CL 95*  CO2 33*  GLUCOSE 287*  BUN 11  CREATININE 0.94  CALCIUM 9.3   Liver Function Tests: Recent Labs  Lab 02/27/21 0716  AST 14*  ALT 12  ALKPHOS 45  BILITOT 0.5  PROT 5.7*  ALBUMIN 2.7*   No results for input(s): LIPASE, AMYLASE in the last 168 hours. No results for input(s): AMMONIA in the last 168 hours. CBC: Recent Labs  Lab 02/27/21 0716  WBC 7.7  NEUTROABS 3.0  HGB 13.1  HCT 39.2  MCV 87.7  PLT 194   Cardiac Enzymes: No results for input(s): CKTOTAL, CKMB, CKMBINDEX, TROPONINI in the last 168 hours. BNP (last 3 results) Recent Labs    01/14/21 0238 01/15/21 0230 01/16/21 0201  BNP 177.8* 115.9* 40.9    ProBNP (last 3 results) No results for input(s): PROBNP in the last 8760 hours.  CBG: Recent Labs  Lab 03/02/21 1003 03/02/21 1353 03/02/21 1631 03/02/21 2107 03/03/21 0719  GLUCAP 374* 195* 99 105* 497*    No results found for this or any previous visit (from the past 240 hour(s)).    Studies: No results found.  Scheduled Meds:  atorvastatin  20 mg Oral  Daily   cholecalciferol  1,000 Units Oral Daily   divalproex  500 mg Oral BID   enoxaparin (LOVENOX) injection  40 mg Subcutaneous Q24H   escitalopram  20 mg Oral Daily   feeding supplement (GLUCERNA SHAKE)  237 mL Oral TID AC & HS   gabapentin  300 mg Oral BID   insulin aspart  0-15 Units Subcutaneous TID  WC   insulin aspart protamine- aspart  10 Units Subcutaneous BID WC   megestrol  800 mg Oral Daily   metFORMIN  1,000 mg Oral BID WC   montelukast  10 mg Oral QHS   pantoprazole  40 mg Oral Daily   senna-docusate  2 tablet Oral Daily   thiamine  100 mg Oral Daily   Continuous Infusions:    Principal Problem:   Bipolar 1 disorder, depressed, full remission ([place]) Active Problems:   Bipolar 1 disorder ([place])   Hypertension   Generalized weakness   Controlled type 2 diabetes mellitus with hyperglycemia, with long-term current use of insulin (HCC)   Hyperlipidemia   NPH (normal pressure hydrocephalus) (HCC)   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   Consultants: Psychiatry Neurology Neurosurgery  Procedures: None  Antibiotics: Anti-infectives (From admission, onward)    Start     Dose/Rate Route Frequency Ordered Stop   02/14/21 0828  vancomycin (VANCOREADY) IVPB 1000 mg/200 mL  Status:  Discontinued        1,000 mg 200 mL/hr over 60 Minutes Intravenous 60 min pre-op 02/14/21 0828 03/02/21 1139   02/13/21 2115  cefTRIAXone (ROCEPHIN) 1 g in sodium chloride 0.9 % 100 mL IVPB  Status:  Discontinued        1 g 200 mL/hr over 30 Minutes Intravenous Every 24 hours 02/13/21 2016 02/16/21 0844        Time spent: 25 minutes    Erin Hearing ANP  [facility] 7 am - 330 pm/M-F for direct patient care and secure chat Please refer to Amion for contact info 50  days

## 2021-03-03 NOTE — Evaluation (Signed)
Clinical/Bedside Swallow Evaluation Patient Details  Name: Carolyn Norton MRN: [id number] Date of Birth: 1966-04-24  Today's Date: 03/03/2021 Time: SLP Start Time (ACUTE ONLY): 1045 SLP Stop Time (ACUTE ONLY): 1100 SLP Time Calculation (min) (ACUTE ONLY): 15 min  Past Medical History:  Past Medical History:  Diagnosis Date   Anxiety    Asthma    COVID    History of   Depression    Diabetes mellitus without complication (HCC)    Hyperlipidemia    Hypertension    Seasonal allergies    Past Surgical History:  Past Surgical History:  Procedure Laterality Date   BREAST EXCISIONAL BIOPSY Right    BREAST SURGERY     right breast   HPI:  55 year old female past medical history of diabetes mellitus type 2, bipolar disorder type I, previous COVID infection, polysubstance abuse that includes cocaine.  She presented from home where she lives with her parents.  Her presenting symptoms included generalized weakness, multiple falls and confusion. Has been seen by SLP in prior admissions for poor intake or vague report of dysphagia, though pt has always presented with normal swallowing, but impaired cognition.   Assessment / Plan / Recommendation Clinical Impression  Pt demonstrates no signs of aspiration or weakness impacting swallowing. She is very confused, does not initiate speech or self feeding unless heavy cueing is given. Pt needed at times verbal commands, hand over hand assist and tactile cueing to recognize bolus or transit bolus when orally holding. Again there is no evidence to suggest impairment of her swallowing mechanism other than potential discomfort from swallowing given erythema on lingual surface. Pt will need total assist with meals with verbal encouragement if attention is poor. WIll soften solids to dys 3/thin liquids and f/u for tolerance, potentially at meal time.      Aspiration Risk  Mild aspiration risk;Risk for inadequate nutrition/hydration    Diet  Recommendation Dysphagia 3 (Mech soft);Thin liquid   Liquid Administration via: Cup;Straw Medication Administration: Crushed with puree Supervision: Staff to assist with self feeding Compensations: Minimize environmental distractions Postural Changes: Seated upright at 90 degrees    Other  Recommendations Oral Care Recommendations: Oral care BID   Follow up Recommendations 24 hour supervision/assistance      Frequency and Duration min 2x/week  2 weeks       Prognosis        Swallow Study   General HPI: 55 year old female past medical history of diabetes mellitus type 2, bipolar disorder type I, previous COVID infection, polysubstance abuse that includes cocaine.  She presented from home where she lives with her parents.  Her presenting symptoms included generalized weakness, multiple falls and confusion. Has been seen by SLP in prior admissions for poor intake or vague report of dysphagia, though pt has always presented with normal swallowing, but impaired cognition. Type of Study: Bedside Swallow Evaluation Previous Swallow Assessment: See HPI Diet Prior to this Study: Regular;Thin liquids Temperature Spikes Noted: No Respiratory Status: Room air History of Recent Intubation: No Behavior/Cognition: Confused;Requires cueing;Distractible Oral Cavity Assessment: Erythema;Lesions Oral Care Completed by SLP: No Oral Cavity - Dentition: Adequate natural dentition Vision: Functional for self-feeding Self-Feeding Abilities: Needs assist Patient Positioning: Postural control interferes with function Baseline Vocal Quality: Normal Volitional Cough: Cognitively unable to elicit Volitional Swallow: Unable to elicit    Oral/Motor/Sensory Function Overall Oral Motor/Sensory Function: Within functional limits   Ice Chips Ice chips: Not tested   Thin Liquid Thin Liquid: Impaired Presentation: Straw Oral Phase Functional Implications:  Oral holding    Nectar Thick Nectar Thick Liquid:  Not tested   Honey Thick Honey Thick Liquid: Not tested   Puree Puree: Impaired Oral Phase Functional Implications: Oral holding   Solid     Solid: Impaired Oral Phase Functional Implications: Prolonged oral transit;Oral holding      Angelgabriel Willmore, Riley Nearing 03/03/2021,11:26 AM

## 2021-03-04 ENCOUNTER — Inpatient Hospital Stay (HOSPITAL_COMMUNITY): Payer: Medicaid Other

## 2021-03-04 LAB — CBC WITH DIFFERENTIAL/PLATELET
Abs Immature Granulocytes: 0.03 10*3/uL (ref 0.00–0.07)
Basophils Absolute: 0.1 10*3/uL (ref 0.0–0.1)
Basophils Relative: 1 %
Eosinophils Absolute: 0 10*3/uL (ref 0.0–0.5)
Eosinophils Relative: 1 %
HCT: 38.1 % (ref 36.0–46.0)
Hemoglobin: 12.6 g/dL (ref 12.0–15.0)
Immature Granulocytes: 0 %
Lymphocytes Relative: 33 %
Lymphs Abs: 2.8 10*3/uL (ref 0.7–4.0)
MCH: 29 pg (ref 26.0–34.0)
MCHC: 33.1 g/dL (ref 30.0–36.0)
MCV: 87.8 fL (ref 80.0–100.0)
Monocytes Absolute: 0.9 10*3/uL (ref 0.1–1.0)
Monocytes Relative: 11 %
Neutro Abs: 4.6 10*3/uL (ref 1.7–7.7)
Neutrophils Relative %: 54 %
Platelets: 210 10*3/uL (ref 150–400)
RBC: 4.34 MIL/uL (ref 3.87–5.11)
RDW: 15.1 % (ref 11.5–15.5)
WBC: 8.4 10*3/uL (ref 4.0–10.5)
nRBC: 0 % (ref 0.0–0.2)

## 2021-03-04 LAB — BASIC METABOLIC PANEL
Anion gap: 13 (ref 5–15)
BUN: 19 mg/dL (ref 6–20)
CO2: 27 mmol/L (ref 22–32)
Calcium: 9.1 mg/dL (ref 8.9–10.3)
Chloride: 94 mmol/L — ABNORMAL LOW (ref 98–111)
Creatinine, Ser: 1.17 mg/dL — ABNORMAL HIGH (ref 0.44–1.00)
GFR, Estimated: 56 mL/min — ABNORMAL LOW (ref >60)
Glucose, Bld: 348 mg/dL — ABNORMAL HIGH (ref 70–99)
Potassium: 3.7 mmol/L (ref 3.5–5.1)
Sodium: 134 mmol/L — ABNORMAL LOW (ref 135–145)

## 2021-03-04 LAB — GLUCOSE, CAPILLARY
Glucose-Capillary: 494 mg/dL — ABNORMAL HIGH (ref 70–99)
Glucose-Capillary: 369 mg/dL — ABNORMAL HIGH (ref 70–99)
Glucose-Capillary: 271 mg/dL — ABNORMAL HIGH (ref 70–99)
Glucose-Capillary: 163 mg/dL — ABNORMAL HIGH (ref 70–99)
Glucose-Capillary: 136 mg/dL — ABNORMAL HIGH (ref 70–99)

## 2021-03-04 MED ORDER — INSULIN ASPART 100 UNIT/ML IJ SOLN
0.0000 [IU] | INTRAMUSCULAR | Status: DC
  Administered 2021-03-04: 4 [IU] via SUBCUTANEOUS
  Administered 2021-03-04: 3 [IU] via SUBCUTANEOUS
  Administered 2021-03-04: 11 [IU] via SUBCUTANEOUS
  Administered 2021-03-05: 4 [IU] via SUBCUTANEOUS
  Administered 2021-03-05: 7 [IU] via SUBCUTANEOUS
  Administered 2021-03-05: 11 [IU] via SUBCUTANEOUS
  Administered 2021-03-05: 3 [IU] via SUBCUTANEOUS
  Administered 2021-03-06: 15 [IU] via SUBCUTANEOUS
  Administered 2021-03-06: 4 [IU] via SUBCUTANEOUS
  Administered 2021-03-06: 3 [IU] via SUBCUTANEOUS
  Administered 2021-03-06 (×3): 4 [IU] via SUBCUTANEOUS
  Administered 2021-03-07: 15 [IU] via SUBCUTANEOUS
  Administered 2021-03-07: 7 [IU] via SUBCUTANEOUS
  Administered 2021-03-07: 11 [IU] via SUBCUTANEOUS

## 2021-03-04 MED ORDER — AMLODIPINE BESYLATE 5 MG PO TABS
5.0000 mg | ORAL_TABLET | Freq: Every day | ORAL | Status: DC
  Administered 2021-03-04: 5 mg via ORAL
  Filled 2021-03-04 (×2): qty 1

## 2021-03-04 MED ORDER — SODIUM CHLORIDE 0.9 % IV SOLN
1.0000 g | INTRAVENOUS | Status: DC
  Administered 2021-03-04 – 2021-03-05 (×2): 1 g via INTRAVENOUS
  Filled 2021-03-04 (×2): qty 1
  Filled 2021-03-04: qty 10

## 2021-03-04 MED ORDER — INSULIN GLARGINE 100 UNIT/ML ~~LOC~~ SOLN
5.0000 [IU] | Freq: Every day | SUBCUTANEOUS | Status: DC
  Administered 2021-03-04 – 2021-03-11 (×8): 5 [IU] via SUBCUTANEOUS
  Filled 2021-03-04 (×9): qty 0.05

## 2021-03-04 NOTE — Progress Notes (Signed)
Neurology Progress Note  Brief HPI: 55 y.o. female with PMHx Bipolar I disorder, anxiety, depression, HTN, HLD, COPD, CKD3b, uncontrolled DM II, and cocaine abuse who presented to the hospital initially in early May 2022 for altered mental status, gait abnormality with falls at home and non-adherence to medications and she was found to be severely dehydrated with hyperglycemia. Her hospitalization has been complicated by a progressive cognitive and functional decline with improvement in cognition and functional status following large volume CSF drainage via fluoro-guided lumbar puncture. She was scheduled to be transitioned to a SNF but neurology was contacted 03/04/2021 for further evaluation of progressive cognitive and functional decline since last CSF drainage to determine if further intervention is necessary prior to discharge.  Subjective: -Per bedside RN, patient with 3 weeks of gradual functional decline.  -States that she was previously sitting up, talking- remained confused, and able to feed herself with adequate PO intake. He states that she gradually declined to be more lethargic with decreased PO intake, unable to feed herself, pocketing food in her mouth with concerns of aspiration, and does not communicate nearly as much with staff as she had previously. -Previous neurology note 02/11/2021 noted that she was sitting up in the chair eating breakfast with noticeable improvement in alertness and cognition following last large volume LP.  -Neurosurgery had evaluated the patient on 02/13/2021 and planned for a lumbar drain trial but the procedure was cancelled on 02/14/2021 since the patient had a new UTI but recommended outpatient follow up  - Neurology was contacted for patient evaluation of progressive functional and cognitive decline since last high volume LP   Exam: Vitals:   03/03/21 2102 03/04/21 0410  BP: (!) 147/74 (!) 167/73  Pulse: 97 97  Resp: 16 20  Temp: 98.3 F (36.8 C) 98 F  (36.7 C)  SpO2: 100% 99%   Gen: Laying in bed, sleeping, in no acute distress Resp: non-labored breathing, no respiratory distress, on room air Abd: soft, non-distended, non-tender  Neuro: Mental Status: Lethargic, briefly opens eyes to voice.  Frequently falls asleep during assessment requiring further stimulation for examination participation.  She is able to whisper her name, age, and current location correctly. She does not speak above a whisper without any obvious dysarthria. She does not attempt to name objects, repeat phrases, and her speech is limited. She does not answer the current year, month, or provide clear or coherent details regarding her history of present illness.  No neglect is noted.  She does not follow commands.  Cranial Nerves: PERRL, EOMI- patient will fixate and track examiner in each lateral visual field, face is symmetric at rest and with movement, hearing is intact to voice, head appears midline, does not protrude tongue to command.  Motor: Bilateral lower extremity weakness without antigravity movement. Generalized weakness noted.  Left upper extremity immediately drifts on assessment, right upper extremity appears stronger with some resistance against gravity but with vertical drift. She does not wiggle toes or give "thumbs up" on command.  Tone is normal, bulk is decreased throughout.  Sensory: Says "ouch" with light application of noxious stimuli symmetrically in upper and lower extremities.  Coordination: Unable to assess due to patient's mental status Gait: Deferred  Pertinent Labs: CBC    Component Value Date/Time   WBC 8.4 03/04/2021 0844   RBC 4.34 03/04/2021 0844   HGB 12.6 03/04/2021 0844   HCT 38.1 03/04/2021 0844   PLT 210 03/04/2021 0844   MCV 87.8 03/04/2021 0844   MCH  29.0 03/04/2021 0844   MCHC 33.1 03/04/2021 0844   RDW 15.1 03/04/2021 0844   LYMPHSABS 2.8 03/04/2021 0844   MONOABS 0.9 03/04/2021 0844   EOSABS 0.0 03/04/2021 0844    BASOSABS 0.1 03/04/2021 0844   CMP     Component Value Date/Time   NA 134 (L) 03/04/2021 0844   K 3.7 03/04/2021 0844   CL 94 (L) 03/04/2021 0844   CO2 27 03/04/2021 0844   GLUCOSE 348 (H) 03/04/2021 0844   BUN 19 03/04/2021 0844   CREATININE 1.17 (H) 03/04/2021 0844   CALCIUM 9.1 03/04/2021 0844   PROT 6.5 03/03/2021 0818   ALBUMIN 3.0 (L) 03/03/2021 0818   AST 14 (L) 03/03/2021 0818   ALT 14 03/03/2021 0818   ALKPHOS 48 03/03/2021 0818   BILITOT 1.1 03/03/2021 0818   GFRNONAA 56 (L) 03/04/2021 0844   GFRAA >60 12/12/2017 0408   No results found for: CHOL, HDL, LDLCALC, LDLDIRECT, TRIG, CHOLHDL Lab Results  Component Value Date   HGBA1C 11.0 (H) 01/12/2021   Urinalysis    Component Value Date/Time   COLORURINE YELLOW 03/03/2021 0810   APPEARANCEUR CLEAR 03/03/2021 0810   LABSPEC 1.022 03/03/2021 0810   PHURINE 5.0 03/03/2021 0810   GLUCOSEU >=500 (A) 03/03/2021 0810   HGBUR NEGATIVE 03/03/2021 0810   BILIRUBINUR NEGATIVE 03/03/2021 0810   KETONESUR 80 (A) 03/03/2021 0810   PROTEINUR NEGATIVE 03/03/2021 0810   UROBILINOGEN 0.2 07/20/2014 2022   NITRITE NEGATIVE 03/03/2021 0810   LEUKOCYTESUR SMALL (A) 03/03/2021 0810   Lab Results  Component Value Date   VITAMINB12 580 02/27/2021   Lab Results  Component Value Date   TSH 1.750 01/25/2021   Imaging Reviewed:  CT Head 03/04/2021: Atrophy with suspected degree of concomitant communicating hydrocephalus, stable. Decreased attenuation in the periventricular white matter may represent small vessel vascular disease, interstitial edema/transependymal CSF flow, or a combination of both entities. No acute infarct evident. No mass or hemorrhage. Foci of arterial vascular calcification noted.  MRI brain 02/02/2021: 1. Similar ventriculomegaly with findings (detailed above) that are suggestive of normal pressure hydrocephalus, particularly given the patient's reported confusion, weakness and gait abnormality with falls.  Central prominent cerebral atrophy with ex vacuo ventricular dilation is a differential consideration. 2. Periventricular T2/FLAIR hyperintensity may represent transependymal flow of CSF given conclusion #1 versus chronic microvascular ischemic disease. 3. Otherwise, no acute abnormality on this motion limited exam. 4. Small bilateral mastoid effusions.  Assessment: 55 y.o. female with prolonged hospitalization for progressive functional and cognitive decline with concern for NPH with notable improvement in mental and functional state following large volume CSF drainage lumbar punctures. Neurology called for reevaluation today with complaints of 3 weeks of progressive cognitive and functional decline. Last large volume LP was completed 02/07/2021. Was unable to undergo LD trial inpatient with neurosurgery as planned on 02/14/2021 due to an acute UTI.  - Exam reveals patient that is lethargic, does not follow commands, drifts off to sleep frequently during assessment, with poor attention. She is able to state her name, age, and location with repeated stimulation. Previous neurology assessment 02/11/2021 revealed patient sitting up at bedside, eating / feeding herself, awake, and communicative as further evidence of cognitive decline over the last 3 weeks.  - Bedside RN with concern for decreased PO intake, pocketing food, inability to feed herself, concern for aspiration, and loss of mobility over the past 3 weeks.  - Initial large volume CSF drainage via LP 02/07/2021 with significant improvement in PT evaluation, marked  improvement in MMSE, and improvement in mobility post drainage.  - Neurosurgery had planned for lumbar drain trial on 02/14/2021 but due to patient with acute UTI, the procedure was cancelled with the recommendation that she follow up outpatient within one month. She has not been accepted for transfer to a skilled nursing facility at this time and remains inpatient with ongoing concern for  progressive cognitive and functional decline since her last large volume CSF drainage.  - CTH complete today with evidence of stable atrophy with suspected degree of concomitant communicating hydrocephalus.   Impression:  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 Progressive cognitive and functional decline; concern decline 2/2 NPH Encephalopathy, multifactorial Uncontrolled type 2 diabetes mellitus with hyperglycemia   Recommendations: - Appreciate neurosurgery evaluation and recommendations for potential LD trial / shunting - Consider additional fluoro-guided LP with large volume drainage and reevaluation of patient mental status if LD trial or shunting unavailable   Plan relayed to the primary team MD and APP via secure chat.   Carolyn Norton, AGACNP-BC [facility] [phone]    Attending Neurohospitalist Addendum Patient seen and examined with APP/Resident. Agree with the history and physical as documented above. Agree with the plan as documented, which I helped formulate. I have independently reviewed the chart, obtained history, review of systems and examined the patient.I have personally reviewed pertinent head/neck/spine imaging (CT/MRI). Please feel free to call with any questions. -- Milon Dikes, MD Neurologist [facility] Pager: [phone]

## 2021-03-04 NOTE — Progress Notes (Signed)
Inpatient Diabetes Program Recommendations  AACE/ADA: New Consensus Statement on Inpatient Glycemic Control (2015)  Target Ranges:  Prepandial:   less than 140 mg/dL      Peak postprandial:   less than 180 mg/dL (1-2 hours)      Critically ill patients:  140 - 180 mg/dL   Lab Results  Component Value Date   GLUCAP 369 (H) 03/04/2021   HGBA1C 11.0 (H) 01/12/2021    Review of Glycemic Control Results for Carolyn Norton, Carolyn Norton (MRN [id number]) as of 03/04/2021 08:09  Ref. Range 03/03/2021 10:15 03/03/2021 11:34 03/03/2021 15:54 03/03/2021 21:03 03/03/2021 21:05 03/04/2021 06:30 03/04/2021 07:45  Glucose-Capillary Latest Ref Range: 70 - 99 mg/dL 182 (H) 993 (H) 92 52 (L) 68 (L) 494 (H) 369 (H)    Current orders for Inpatient glycemic control: Novolog 0-15 units Q4H Metformin 1000 mg BID  Inpatient Diabetes Program Recommendations:    Somogyi effect v/s dawn phenomenon.  Please check CBG Q4H including 0200.    Please consider: Levemir 10 units QHS  Novolog 0-9 units Q4H  Will continue to follow while inpatient.  Thank you, Dulce Sellar, RN, BSN Diabetes Coordinator Inpatient Diabetes Program [phone] (team pager from 8a-5p)

## 2021-03-04 NOTE — Progress Notes (Addendum)
[facility] PROGRESS NOTE  Carolyn Norton NWG:[id number] DOB: [birth year] DOA: 01/11/2021 PCP: [name], FNP     Status: Remains inpatient appropriate because:Unsafe d/c plan-due to patient's underlying poor mental status short-term memory deficits she will require 24/7 care after discharge  Dispo:  Patient From:  Home  Planned Disposition: SNF  Medically stable for discharge:  yes  Barriers to DC: Continues to require significant assistance with mobility.  Continues to have significant short-term memory deficits impeding patient's ability to safely return home with elderly parents; please note that the patient's father continues to work outside of the home therefore the burden of care would follow-up on the patient's elderly mother             Difficult to place: Yes  Level of care: Med-Surg  Code Status: DNR Family Communication: 64/10 Mother [name] updated DVT prophylaxis: Lovenox COVID vaccination status: Unknown   HPI: 55 year old female past medical history of diabetes mellitus type 2, bipolar disorder type I, previous COVID infection, polysubstance abuse that includes cocaine.  She presented from home where she lives with her parents.  Her presenting symptoms included generalized weakness, multiple falls and confusion.  Family confirmed that she had been having the symptoms for at least a month with last use of crack cocaine 1 month prior to presentation.  Ever since that time she had not been doing well.  She would also be having issues with hyperglycemia.  In the ER she was diagnosed with severe dehydration, hyperglycemia and metabolic encephalopathy.  Since admission patient has been evaluated by the psychiatric team who has deemed her alert and oriented and having capacity to make decisions.  Patient is agreeable to placement at a rehab.  Currently she has severe physical deconditioning which was POA and PT is recommending SNF.  Because of her history of  polysubstance abuse and lack of funding SNF is not an option.  Family is agreeable to taking her back in home when she is able to ambulate well enough to get back and forth for meals into the bathroom.  Subjective: Awake and smiling.  Initially was able to follow simple commands sticking out tongue.  Asked patient to squeeze hands and she was unable to do this.  Noted patient appears to have left-sided neglect and was unable to get her to lift up either arm or either leg to obtain adequate evaluation.  She denies tongue or oral pain.  Objective: Vitals:   03/03/21 2102 03/04/21 0410  BP: (!) 147/74 (!) 167/73  Pulse: 97 97  Resp: 16 20  Temp: 98.3 F (36.8 C) 98 F (36.7 C)  SpO2: 100% 99%    Intake/Output Summary (Last 24 hours) at 03/04/2021 0727 Last data filed at 03/03/2021 1800 Gross per 24 hour  Intake 803.94 ml  Output 500 ml  Net 303.94 ml   Filed Weights   01/11/21 1152  Weight: 99.8 kg    Exam:  Constitutional: Pleasant, no acute distress. ENT: Tongue less erythematous in appearance, papular areas have diminished in size.  Unable to adequately examine oral cavity and posterior pharynx due to patient not opening mouth wide enough and having difficulty following commands. Respiratory: Lungs remain clear.  Room air.  Pulse oximetry 99% Cardiac: Hypertensive.  SBP 167.  Pulse regular.  No peripheral edema. Abdomen: LBM 6/21, soft nontender nondistended.  Continues with variable but mostly poor oral intake. Neurologic: Cranial nerves are grossly intact.  Patient has weak upper and lower extremity strength estimated at 3+-4/5.  Bilateral dysmetria and asterixis of upper extremities.Continues to require 2+ max assist for mobility with walker. Psychiatric: Awake.  Completely disoriented today.  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  Significantly abnormal cognitive/executive functioning tests consistent with dementia 6/28 continues to have migratory neurological deficits with today noted to have either left-sided neglect or weakness.  Check noncontrasted CT of the head  Ventriculomegaly/? NPH Neurosurgery and neurology have signed off. S/p large volume LP without any significant improvement in status-repeat B1 level 259; repeat B12 707 NPH evaluation is typically done in the outpatient setting per neurosurgeon and neurology can be accomplished after discharge  Anorexia/Odynophagia Continue protein beverages Continue Megace 800 mg daily with daily Lovenox Suspect erythematous tongue (and possibly similar findings in posterior pharynx) likely influencing intake SLP following-continue carb modified, thin liquid diet   E. Coli UTI Urine cx + 60,000 colonies E. Coli- sx due to hyperglycemia Recently treated w/ Rocephin for UTI- urine cx never obtained Will start empiric Rocephin until urine cx final Suspect purewick contributing to recurrent UTI so order placed to apply only at HS  Diabetes mellitus 2 with hyperglycemia on insulin Hemoglobin A1c 11.0 Change Lantus to 5 units HS Continue every 4 hours SSI but increased to resistant scale 6/28 had issues with hypoglycemia around 9 AM with subsequent significant a.m. hyperglycemia 494 Since intake remains poor we will discontinue metformin as well.  Dyslipidemia Continue statin  History of bipolar 1 disorder/polysubstance abuse/cognitive impairment Continue escitalopram, Neurontin and Depakote Functional cognitive evaluation with significant  abnormalities  Physical deconditioning/bilateral heel cord shortening Continue bilateral PRAFO boots on every 4 hours and off every 4 hours    Other problems: Acute kidney injury on CKD 3a Acute kidney injury resolved after IV fluids Baseline creatinine 1.3  COPD Continue Singulair, nebs as needed, consider adding LABA  GERD Continue PPI  Hypokalemia/hypomagnesemia Resolved  Data Reviewed: Basic Metabolic Panel: Recent Labs  Lab 02/27/21 0716 03/03/21 0818  NA 135 131*  K 4.0 5.0  CL 95* 92*  CO2 33* 21*  GLUCOSE 287* 513*  BUN 11 20  CREATININE 0.94 1.38*  CALCIUM 9.3 9.1   Liver Function Tests: Recent Labs  Lab 02/27/21 0716 03/03/21 0818  AST 14* 14*  ALT 12 14  ALKPHOS 45 48  BILITOT 0.5 1.1  PROT 5.7* 6.5  ALBUMIN 2.7* 3.0*   No results for input(s): LIPASE, AMYLASE in the last 168 hours. No results for input(s): AMMONIA in the last 168 hours. CBC: Recent Labs  Lab 02/27/21 0716 03/03/21 0818  WBC 7.7 11.1*  NEUTROABS 3.0 7.1  HGB 13.1 13.8  HCT 39.2 40.1  MCV 87.7 87.4  PLT 194 207   Cardiac Enzymes: No results for input(s): CKTOTAL, CKMB, CKMBINDEX, TROPONINI in the last 168 hours. BNP (last 3 results) Recent Labs    01/14/21 0238 01/15/21 0230 01/16/21 0201  BNP 177.8* 115.9* 40.9    ProBNP (last 3 results) No results for input(s): PROBNP in the last 8760 hours.  CBG: Recent Labs  Lab 03/03/21 1134 03/03/21 1554 03/03/21 2103 03/03/21 2105 03/04/21 0630  GLUCAP 307* 92 52* 68* 494*    No results found for this or any  previous visit (from the past 240 hour(s)).    Studies: No results found.  Scheduled Meds:  atorvastatin  20 mg Oral Daily   cholecalciferol  1,000 Units Oral Daily   divalproex  500 mg Oral BID   enoxaparin (LOVENOX) injection  40 mg Subcutaneous Q24H   escitalopram  20 mg Oral Daily   feeding supplement (GLUCERNA SHAKE)  237 mL Oral TID AC & HS   gabapentin  300 mg Oral BID   insulin aspart   0-15 Units Subcutaneous Q4H   magic mouthwash w/lidocaine  10 mL Oral QID   megestrol  800 mg Oral Daily   metFORMIN  1,000 mg Oral BID WC   montelukast  10 mg Oral QHS   pantoprazole  40 mg Oral Daily   senna-docusate  2 tablet Oral Daily   thiamine  100 mg Oral Daily   Continuous Infusions:  sodium chloride 125 mL/hr at 03/03/21 1800     Principal Problem:   Bipolar 1 disorder, depressed, full remission (HCC) Active Problems:   Bipolar 1 disorder (HCC)   Hypertension   Generalized weakness   Controlled type 2 diabetes mellitus with hyperglycemia, with long-term current use of insulin (HCC)   Hyperlipidemia   NPH (normal pressure hydrocephalus) (HCC)   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   Consultants: Psychiatry Neurology Neurosurgery  Procedures: None  Antibiotics: Anti-infectives (From admission, onward)    Start     Dose/Rate Route Frequency Ordered Stop   02/14/21 0828  vancomycin (VANCOREADY) IVPB 1000 mg/200 mL  Status:  Discontinued        1,000 mg 200 mL/hr over 60 Minutes Intravenous 60 min pre-op 02/14/21 0828 03/02/21 1139   02/13/21 2115  cefTRIAXone (ROCEPHIN) 1 g in sodium chloride 0.9 % 100 mL IVPB  Status:  Discontinued        1 g 200 mL/hr over 30 Minutes Intravenous Every 24 hours 02/13/21 2016 02/16/21 0844        Time spent: 25 minutes    [name] ANP  [facility] 7 am - 330 pm/M-F for direct patient care and secure chat Please refer to Amion for contact info 51  days

## 2021-03-04 NOTE — Progress Notes (Signed)
Physical Therapy Treatment Patient Details Name: Carolyn Norton MRN: [id number] DOB: 1966/07/25 Today's Date: 03/04/2021    History of Present Illness Pt is a 55 y.o. female admitted 01/11/21 with multiple falls, AMS, weakness. Workup for severe dehydration, DKA, metabolic encephalopathy. 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 Neurosurgery following for possible lumbar drain trial vs repeat LP vs VP shunt placement. Placement of lumbar drain on 6/10 cancelled due to UTI. PMH includes DM2, COPD, CKD3, GERD, bipolar disorder, COVID-19, polysubstance abuse including cocaine.    PT Comments    Patient less responsive (although eyes open throughout session). She required hand-over-hand assist for reaching towards rail and attempted to hold onto rail but could only hold briefly. Noted pt down for head CT earlier today due to level of responsiveness. Unable to attempt transfer in current state and returned from EOB to supine. PRAFOs donned for bil heel cord tightness    Follow Up Recommendations  SNF (family wants to take pt home but cannot at current functional level)     Equipment Recommendations  Rolling walker with 5" wheels;3in1 (PT);Hospital bed;Wheelchair (measurements PT);Other (comment) (hoyer lift; *reassess after lumbar drain)    Recommendations for Other Services       Precautions / Restrictions Precautions Precautions: Fall Restrictions Weight Bearing Restrictions: No    Mobility  Bed Mobility Overal bed mobility: Needs Assistance Bed Mobility: Rolling;Sidelying to Sit;Sit to Sidelying Rolling: Max assist;+2 for physical assistance Sidelying to sit: HOB elevated;Total assist     Sit to sidelying: Max assist;+2 for physical assistance General bed mobility comments: Max A for all parts of bed mobility. Patient given 1-step verbal commands but after >15 seconds, patient still not performing task (i.e. turning head or reaching for rail).     Transfers                 General transfer comment: unable to attempt  Ambulation/Gait                 Stairs             Wheelchair Mobility    Modified Rankin (Stroke Patients Only)       Balance Overall balance assessment: Needs assistance;History of Falls Sitting-balance support: Feet supported Sitting balance-Leahy Scale: Poor Sitting balance - Comments: pt not attempting to use bil UE for support; sat EOB ~4 minutes with no improvement in righting herself and returned to side to supine                                    Cognition Arousal/Alertness: Lethargic Behavior During Therapy: Flat affect Overall Cognitive Status: Difficult to assess                     Current Attention Level: Focused   Following Commands: Follows one step commands inconsistently;Follows one step commands with increased time     Problem Solving: Slow processing;Difficulty sequencing;Requires verbal cues;Decreased initiation;Requires tactile cues General Comments: more lethargic (although eyes open entire session and will respond "hey" when name called); required hand-over-hand guidance for following commands      Exercises General Exercises - Upper Extremity Shoulder Flexion: Strengthening;Both;10 reps;Seated;Theraband Theraband Level (Shoulder Flexion): Level 1 (Yellow) Shoulder Extension: Strengthening;Both;10 reps;Seated Shoulder Horizontal ABduction: Strengthening;10 reps;Seated;Theraband Theraband Level (Shoulder Horizontal Abduction): Level 1 (Yellow) Shoulder Horizontal ADduction: Strengthening;10 reps;Seated;Theraband Theraband Level (Shoulder Horizontal Adduction): Level 1 (Yellow) Elbow Flexion: Strengthening;Both;10 reps;Seated;Theraband  Theraband Level (Elbow Flexion): Level 1 (Yellow) Elbow Extension: Strengthening;Both;10 reps;Seated;Theraband Theraband Level (Elbow Extension): Level 1 (Yellow) General Exercises - Lower  Extremity Ankle Circles/Pumps: Both;10 reps;PROM;Supine Other Exercises Other Exercises: prolonged heel cord stretches after PROM and donned bil PRAFOs; rt ankle tighter than left    General Comments        Pertinent Vitals/Pain Pain Assessment: Faces Faces Pain Scale: No hurt    Home Living                      Prior Function            PT Goals (current goals can now be found in the care plan section) Acute Rehab PT Goals Patient Stated Goal: None stated. Time For Goal Achievement: 03/10/21 Potential to Achieve Goals: Fair Progress towards PT goals: Not progressing toward goals - comment (more lethargic)    Frequency    Min 2X/week      PT Plan Current plan remains appropriate    Co-evaluation              AM-PAC PT "6 Clicks" Mobility   Outcome Measure  Help needed turning from your back to your side while in a flat bed without using bedrails?: Total Help needed moving from lying on your back to sitting on the side of a flat bed without using bedrails?: Total Help needed moving to and from a bed to a chair (including a wheelchair)?: Total Help needed standing up from a chair using your arms (e.g., wheelchair or bedside chair)?: Total Help needed to walk in hospital room?: Total Help needed climbing 3-5 steps with a railing? : Total 6 Click Score: 6    End of Session   Activity Tolerance: Patient limited by lethargy Patient left: with call bell/phone within reach;in bed;with bed alarm set Nurse Communication: Other (comment) (needs purewick replaced) PT Visit Diagnosis: Muscle weakness (generalized) (M62.81);Repeated falls (R29.6);Difficulty in walking, not elsewhere classified (R26.2);Unsteadiness on feet (R26.81)     Time: 3818-2993 PT Time Calculation (min) (ACUTE ONLY): 24 min  Charges:  $Therapeutic Activity: 23-37 mins                      Jerolyn Center, PT Pager [phone]    Zena Amos 03/04/2021, 12:08 PM

## 2021-03-05 ENCOUNTER — Inpatient Hospital Stay (HOSPITAL_COMMUNITY): Payer: Medicaid Other

## 2021-03-05 LAB — URINE CULTURE: Culture: 60000 — AB

## 2021-03-05 LAB — BLOOD GAS, ARTERIAL
Acid-Base Excess: 5.8 mmol/L — ABNORMAL HIGH (ref 0.0–2.0)
Bicarbonate: 29.9 mmol/L — ABNORMAL HIGH (ref 20.0–28.0)
Drawn by: 27011
FIO2: 21
O2 Saturation: 93.5 %
Patient temperature: 36.6
pCO2 arterial: 43.5 mmHg (ref 32.0–48.0)
pH, Arterial: 7.449 (ref 7.350–7.450)
pO2, Arterial: 65.9 mmHg — ABNORMAL LOW (ref 83.0–108.0)

## 2021-03-05 LAB — COMPREHENSIVE METABOLIC PANEL
ALT: 11 U/L (ref 0–44)
AST: 14 U/L — ABNORMAL LOW (ref 15–41)
Albumin: 2.7 g/dL — ABNORMAL LOW (ref 3.5–5.0)
Alkaline Phosphatase: 42 U/L (ref 38–126)
Anion gap: 9 (ref 5–15)
BUN: 14 mg/dL (ref 6–20)
CO2: 28 mmol/L (ref 22–32)
Calcium: 9.2 mg/dL (ref 8.9–10.3)
Chloride: 99 mmol/L (ref 98–111)
Creatinine, Ser: 1.01 mg/dL — ABNORMAL HIGH (ref 0.44–1.00)
GFR, Estimated: >60 mL/min (ref >60)
Glucose, Bld: 299 mg/dL — ABNORMAL HIGH (ref 70–99)
Potassium: 4 mmol/L (ref 3.5–5.1)
Sodium: 136 mmol/L (ref 135–145)
Total Bilirubin: 0.4 mg/dL (ref 0.3–1.2)
Total Protein: 6.1 g/dL — ABNORMAL LOW (ref 6.5–8.1)

## 2021-03-05 LAB — GLUCOSE, CAPILLARY
Glucose-Capillary: 273 mg/dL — ABNORMAL HIGH (ref 70–99)
Glucose-Capillary: 133 mg/dL — ABNORMAL HIGH (ref 70–99)
Glucose-Capillary: 157 mg/dL — ABNORMAL HIGH (ref 70–99)
Glucose-Capillary: 205 mg/dL — ABNORMAL HIGH (ref 70–99)

## 2021-03-05 MED ORDER — KCL IN DEXTROSE-NACL 20-5-0.9 MEQ/L-%-% IV SOLN
INTRAVENOUS | Status: DC
  Filled 2021-03-05 (×7): qty 1000

## 2021-03-05 MED ORDER — LEVETIRACETAM IN NACL 500 MG/100ML IV SOLN
500.0000 mg | Freq: Two times a day (BID) | INTRAVENOUS | Status: DC
  Administered 2021-03-05 – 2021-03-09 (×10): 500 mg via INTRAVENOUS
  Filled 2021-03-05 (×11): qty 100

## 2021-03-05 MED ORDER — SODIUM CHLORIDE 0.9 % IV BOLUS
500.0000 mL | Freq: Once | INTRAVENOUS | Status: AC
  Administered 2021-03-05: 500 mL via INTRAVENOUS

## 2021-03-05 NOTE — Progress Notes (Signed)
Inpatient Diabetes Program Recommendations  AACE/ADA: New Consensus Statement on Inpatient Glycemic Control (2015)  Target Ranges:  Prepandial:   less than 140 mg/dL      Peak postprandial:   less than 180 mg/dL (1-2 hours)      Critically ill patients:  140 - 180 mg/dL   Lab Results  Component Value Date   GLUCAP 133 (H) 03/05/2021   HGBA1C 11.0 (H) 01/12/2021   Results for EMYLIE, AMSTER (MRN [id number]) as of 03/05/2021 12:35  Ref. Range 03/04/2021 07:45 03/04/2021 12:50 03/04/2021 16:14 03/04/2021 19:57 03/05/2021 08:03 03/05/2021 12:24  Glucose-Capillary Latest Ref Range: 70 - 99 mg/dL 191 (H) 660 (H) 600 (H) 136 (H) 273 (H) 133 (H)    Am glucose better this morning.  There was not a 0200 CBG again this morning.  Please chack 0200 CBG (CBG's are ordered Q4H).  Please consider Lantus 10 QHS.  Will continue to follow while inpatient.  Thank you, Dulce Sellar, RN, BSN Diabetes Coordinator Inpatient Diabetes Program [phone] (team pager from 8a-5p)

## 2021-03-05 NOTE — Progress Notes (Signed)
Occupational Therapy Treatment Patient Details Name: Carolyn Norton MRN: [id number] DOB: 1966/07/27 Today's Date: 03/05/2021    History of present illness Pt is a 55 y.o. female admitted 01/11/21 with multiple falls, AMS, weakness. Workup for severe dehydration, DKA, metabolic encephalopathy. 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 Neurosurgery following for possible lumbar drain trial vs repeat LP vs VP shunt placement. Placement of lumbar drain on 6/10 cancelled due to UTI. PMH includes DM2, COPD, CKD3, GERD, bipolar disorder, COVID-19, polysubstance abuse including cocaine.   OT comments  Pt much more lethargic and difficult to arouse this session. Pt initially stating "hello" upon arrival but makes no other efforts to verbalize or follow commands. Pt currently requires total A +2 to complete bed mobility tasks, pt unable to hold on to bed rail for rolling even with hand over hand assist. Pt would continue to benefit from skilled occupational therapy while admitted and after d/c to address the below listed limitations in order to improve overall functional mobility and facilitate independence with BADL participation. DC plan remains appropriate, will follow acutely per POC.    Follow Up Recommendations  SNF;Supervision/Assistance - 24 hour    Equipment Recommendations  3 in 1 bedside commode    Recommendations for Other Services      Precautions / Restrictions Precautions Precautions: Fall Restrictions Weight Bearing Restrictions: No       Mobility Bed Mobility Overal bed mobility: Needs Assistance Bed Mobility: Rolling Rolling: +2 for physical assistance;Total assist         General bed mobility comments: total A +2 to roll R<>L t change bed pad, pt making no efforts to assist with rolling even with hand over hand reach to bed rail    Transfers                 General transfer comment: unable to attempt d/t level of arousal     Balance                                           ADL either performed or assessed with clinical judgement   ADL Overall ADL's : Needs assistance/impaired     Grooming: Wash/dry face;Total assistance Grooming Details (indicate cue type and reason): no initiation to wash face even with multimodal cuess and hand over hand assist         Upper Body Dressing : Total assistance;Bed level Upper Body Dressing Details (indicate cue type and reason): no efforts to assist with donning new gown Lower Body Dressing: Total assistance;Bed level Lower Body Dressing Details (indicate cue type and reason): total A to don footwear, pt not assist with elevating BLEs during donning of footwear   Toilet Transfer Details (indicate cue type and reason): deferred d/t level of arousal         Functional mobility during ADLs: Total assistance;+2 for physical assistance (bed mobility only) General ADL Comments: pt more lethjargic this session, following < 10% of commands. session with a focus on attention to task and following commands from bed level     Vision       Perception     Praxis      Cognition Arousal/Alertness: Lethargic Behavior During Therapy: Flat affect Overall Cognitive Status: Difficult to assess  General Comments: pt much more lethargic this session with only stating "hello" upon arrival but then follows no other commands during session, even with hand over hand assist and max multimodal cues        Exercises     Shoulder Instructions       General Comments      Pertinent Vitals/ Pain       Pain Assessment: Faces Faces Pain Scale: Hurts little more Pain Location: generalized with bed mobility Pain Descriptors / Indicators: Discomfort;Grimacing;Moaning Pain Intervention(s): Monitored during session;Repositioned;Limited activity within patient's tolerance  Home Living                                           Prior Functioning/Environment              Frequency  Min 2X/week        Progress Toward Goals  OT Goals(current goals can now be found in the care plan section)  Progress towards OT goals: Not progressing toward goals - comment (decreased level of arousal)  Acute Rehab OT Goals OT Goal Formulation: Patient unable to participate in goal setting Time For Goal Achievement: 03/14/21 Potential to Achieve Goals: Poor  Plan Discharge plan remains appropriate;Frequency remains appropriate    Co-evaluation                 AM-PAC OT "6 Clicks" Daily Activity     Outcome Measure   Help from another person eating meals?: A Lot Help from another person taking care of personal grooming?: Total Help from another person toileting, which includes using toliet, bedpan, or urinal?: Total Help from another person bathing (including washing, rinsing, drying)?: Total Help from another person to put on and taking off regular upper body clothing?: Total Help from another person to put on and taking off regular lower body clothing?: Total 6 Click Score: 7    End of Session    OT Visit Diagnosis: Unsteadiness on feet (R26.81);Other abnormalities of gait and mobility (R26.89);Muscle weakness (generalized) (M62.81);Other symptoms and signs involving cognitive function;Other symptoms and signs involving the nervous system (R29.898)   Activity Tolerance Patient limited by lethargy   Patient Left in bed;with call bell/phone within reach;with bed alarm set;with nursing/sitter in room;Other (comment) (NT present replacing purewick)   Nurse Communication Mobility status;Other (comment) (minimal gains made during session)        Time: 8242-3536 OT Time Calculation (min): 14 min  Charges: OT General Charges $OT Visit: 1 Visit OT Treatments $Self Care/Home Management : 8-22 mins  Lenor Derrick., COTA/L Acute Rehabilitation  Services [phone]    Barron Schmid 03/05/2021, 10:22 AM

## 2021-03-05 NOTE — Progress Notes (Addendum)
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 Still unsure if NPH primary issues or other underlying problem due to h/o drug abuse/bipolar. I plan to update her family later today. RN said they were here yesterday- if this is irreversible she shouldn't get a PEG and should be comfort in my opinion"   HPI:  Per intake H&P --> 55 year old female past medical history of diabetes mellitus type 2, bipolar disorder type I, previous COVID infection, polysubstance abuse that includes cocaine.  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   Carolyn Norton has been at [facility] for the past fifty-three days is not eating or drinking sufficiently.  Palliative care has been asked to get re-involved to further discuss goals of care in the setting of progressive failure to thrive and generalized weakness.  Today's Discussion (03/05/2021): Chart reviewed.  Spoke with patient's nurse, Josh at bedside.  He shares with me that [name] has been gradually worsening and not being sufficiently for greater than one week's time.  Reviewed that she is more sluggish and lethargic today.  Per conversation with Dr. Fran Lowes and [name], [name] is more hypoxic today though her chest x-ray did not show evidence of pneumonia.  This is thought to be to do to sleep apnea - supplemental oxygen has been provided.  I called patient's mother, Carolyn Norton this afternoon.  Carolyn Norton shares with me that since Japneet has been hospitalized to use continue to steadily decline.  Reviewed the hope that lumbar punctures would improve her condition and for the short term.  They did though, she recurrently  appears to be declining.  Her mother shares that her "life choices have gotten her into the situation" referring to her polysubstance abuse.  We discussed that it would be very reasonable if the lumbar puncture )which is going to pursue tomorrow) does not improve her condition that we should consider a comfort end-of-life modality of care.   We talked about transition to comfort measures in house and what that would entail inclusive of medications to control pain, dyspnea, agitation, nausea, itching, and hiccups.  We discussed stopping all uneccessary measures such as blood draws, needle sticks, and frequent vital signs.  Utilized reflective listening throughout our time together, given the magnitude of this difficult conversation.  Questions and concerns addressed   Objective Assessment: Vital Signs Vitals:   03/04/21 1959 03/05/21 0300  BP: (!) 153/76 131/62  Pulse: 88 92  Resp: 20 20  Temp: 98 F (36.7 C) 97.8 F (36.6 C)  SpO2: 96% 97%    Intake/Output Summary (Last 24 hours) at 03/05/2021 1235 Last data filed at 03/05/2021 0900 Gross per 24 hour  Intake --  Output 750 ml  Net -750 ml   Last Weight  Most recent update: 01/11/2021 11:52 AM    Weight  99.8 kg (220 lb)            Gen:  Caucasian F in NAD HEENT: Dry  mucous membranes CV: Regular rate and rhythm PULM: On 2LPM [place] ABD: soft/nontender EXT: No edema Neuro: Lethargic, delayed responses  SUMMARY OF RECOMMENDATIONS   DNAR/DNI, No G-Tube   Plan for repeat LP tomorrow to see if symptoms improve  If continue to decline patients mother, Carolyn Norton is in agreement with comfort care. We have determined she will be assessed once more after LP tomorrow and then we would call Carolyn Norton for further conversations.    Ongoing incremental palliative care support  Time Spent :35 Greater than 50% of the time was spent in counseling and coordination of  care ______________________________________________________________________________________ Lamarr Lulas [facility] Palliative Medicine Team Team Cell Phone: [phone] Please utilize secure chat with additional questions, if there is no response within 30 minutes please call the above phone number  Palliative Medicine Team providers are available by phone from 7am to 7pm daily and can be reached through the team cell phone.  Should this patient require assistance outside of these hours, please call the patient's attending physician.

## 2021-03-05 NOTE — Plan of Care (Signed)
Spoke with the on-call radiologist and fluoroscopy-last few tabs have been very difficult due to lower opening pressures and currently also seems to be somewhat dehydrated. Plan is to hydrate her adequately over the next 24 hours and schedule her for a fluoroscopy guided LP for tomorrow. Plan was relayed to the primary team.  -- Milon Dikes, MD Neurologist [facility] Pager: [phone]

## 2021-03-05 NOTE — Progress Notes (Addendum)
[facility] PROGRESS NOTE  Carolyn Norton MEQ:[id number] DOB: [birth year] DOA: 01/11/2021 PCP: [name], FNP     Status: Remains inpatient appropriate because:Unsafe d/c plan-due to patient's underlying poor mental status short-term memory deficits she will require 24/7 care after discharge  Dispo:  Patient From:  Home  Planned Disposition: SNF  Medically stable for discharge:  yes  Barriers to DC: Continues to require significant assistance with mobility.  Continues to have significant short-term memory deficits impeding patient's ability to safely return home with elderly parents; please note that the patient's father continues to work outside of the home therefore the burden of care would follow-up on the patient's elderly mother             Difficult to place: Yes  Level of care: Med-Surg  Code Status: DNR Family Communication: 37/10 Mother [name] updated DVT prophylaxis: Lovenox COVID vaccination status: Unknown   HPI: 55 year old female past medical history of diabetes mellitus type 2, bipolar disorder type I, previous COVID infection, polysubstance abuse that includes cocaine.  She presented from home where she lives with her parents.  Her presenting symptoms included generalized weakness, multiple falls and confusion.  Family confirmed that she had been having the symptoms for at least a month with last use of crack cocaine 1 month prior to presentation.  Ever since that time she had not been doing well.  She would also be having issues with hyperglycemia.  In the ER she was diagnosed with severe dehydration, hyperglycemia and metabolic encephalopathy.  Since admission patient has been evaluated by the psychiatric team who has deemed her alert and oriented and having capacity to make decisions.  Patient is agreeable to placement at a rehab.  Currently she has severe physical deconditioning which was POA and PT is recommending SNF.  Because of her history of  polysubstance abuse and lack of funding SNF is not an option.  Family is agreeable to taking her back in home when she is able to ambulate well enough to get back and forth for meals into the bathroom.  Subjective: Patient very lethargic today.  Only briefly opens her eyes and unable to follow any commands.  Unable to swallow medications or eat  Objective: Vitals:   03/04/21 1959 03/05/21 0300  BP: (!) 153/76 131/62  Pulse: 88 92  Resp: 20 20  Temp: 98 F (36.7 C) 97.8 F (36.6 C)  SpO2: 96% 97%    Intake/Output Summary (Last 24 hours) at 03/05/2021 0751 Last data filed at 03/04/2021 1130 Gross per 24 hour  Intake --  Output 600 ml  Net -600 ml   Filed Weights   01/11/21 1152  Weight: 99.8 kg    Exam:  Constitutional: Lethargic, mildly distressed in regards to slight increased work of breathing ENT: Unable to adequately assess tongue at this time. Respiratory: Increased work of breathing with mild tachypnea, no circumoral pallor, room air. Cardiac: Normal heart sounds, normotensive, no peripheral edema Abdomen: LBM 6/21, soft with normoactive bowel sounds.  Does not appear to be tender upon palpation Neurologic: Patient now very lethargic.  Will open her eyes to tactile stimulation primarily, somewhat to voice.  Does not stay awake long.  Unable to follow any commands. Psychiatric: Lethargic and somnolent today.  Nonverbal when awakened.   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  Today on 6/29 patient more lethargic than usual.  Oral medications discontinued and appropriate medications transition to IV, she was made n.p.o. and IV fluids changed to D5 normal saline with  potassium at 125 cc/h. It is important to note that after her initial large-volume LP procedures she had significant improvement in her mobility in cognitive scores. CT of the head completed on 6/28 continues to demonstrate evidence of possible NPH/ventriculomegaly. Neurology has reassessed patient and believes her symptoms are likely coming from NPH. Plan was to undergo large-volume LP today but given her difficult anatomy and recent dehydration plan is to delay another 24 hours and undergo fluoroscopy guided LP  Significantly abnormal cognitive/executive functioning tests consistent with dementia Significant concerns that even after large-volume LP that patient's mental status will not significantly improve.  At present she is essentially bedbound and she has no appetite and has not been eating.  Status discussed with patient's parents per myself as well as palliative medicine team.  Previously they had stated they would not want artificial feedings.  If after large-volume LP patient does not make significant gains in her status likely will pursue comfort measures. Patient started on seizure prophylaxis this admission **Patient currently on Keppra.  This was not one of her home medications and she has no prior to admission history of seizures or seizures during this admission per chart review.  Levetiracetam level pending.  Will discuss with attending and consulting neurologist as to whether this medication needs to be continued  Ventriculomegaly/? NPH Neurosurgery and neurology have signed off. S/p large volume LP with significant improvement in status Additional large-volume LP planned for 6/30 NPH evaluation is typically done in the outpatient setting per neurosurgeon and neurology can be accomplished after discharge Neurosurgery reconsulted on 6/29 and are awaiting patient response to large-volume LP  Acute hypoxemia/COPD Patient with subtle increased work of breathing and tachypnea ABG with  PaO2 of 65.9 with baseline in December 2021 of 89 Mild elevation in bicarbonate with normal pH and normal CO2 Chest x-ray unremarkable Suspect hypoxemia secondary to altered mental status with subsequent hyperventilation Start 2 L nasal cannula oxygen and monitor pulse oximetry continuously.  Dehydration with acute kidney injury Baseline creatinine 0.9 with peak of creatinine to 1.38 on 6/27.  This in context of hyperglycemia 513 Patient subsequently initiated on IV fluids and as of today creatinine down to 1.01 Will give 500 cc normal saline bolus and repeat labs in a.m.  Anorexia/Odynophagia Currently n.p.o. secondary to altered mental status   E. Coli [name]/Klebsiella UTI Urine cx + 60,000 colonies E. Coli- sx due to hyperglycemia Recently treated w/ Rocephin for UTI- urine cx never obtained Will start empiric Rocephin until urine cx final Suspect purewick contributing to recurrent UTI so order placed to apply only at [facility] 6/29 as a precaution we will check blood cultures  Diabetes mellitus 2 with hyperglycemia on insulin Hemoglobin A1c 11.0 Change Lantus to 5 units HS Continue every 4 hours SSI but increased to resistant scale 6/28 had issues with hypoglycemia around 9 AM with subsequent significant a.m. hyperglycemia 494 Metformin discontinued 6/28 Unable to eat so IV fluids changed to D5 normal saline with 20 of potassium at 125 cc/h  Dyslipidemia Statin on hold while n.p.o.  History of bipolar 1 disorder/polysubstance abuse/cognitive impairment Continue to hold escitalopram, Neurontin and Depakote no n.p.o. Functional cognitive evaluation with significant abnormalities  Physical deconditioning/bilateral heel cord shortening Continue bilateral PRAFO boots on every 4 hours  and off every 4 hours    Other problems: GERD Oral PPI on hold while n.p.o.  Hypokalemia/hypomagnesemia Resolved  Data Reviewed: Basic Metabolic Panel: Recent Labs  Lab 02/27/21 0716  03/03/21 0818 03/04/21 0844  NA 135 131* 134*  K 4.0 5.0 3.7  CL 95* 92* 94*  CO2 33* 21* 27  GLUCOSE 287* 513* 348*  BUN 11 20 19   CREATININE 0.94 1.38* 1.17*  CALCIUM 9.3 9.1 9.1   Liver Function Tests: Recent Labs  Lab 02/27/21 0716 03/03/21 0818  AST 14* 14*  ALT 12 14  ALKPHOS 45 48  BILITOT 0.5 1.1  PROT 5.7* 6.5  ALBUMIN 2.7* 3.0*   No results for input(s): LIPASE, AMYLASE in the last 168 hours. No results for input(s): AMMONIA in the last 168 hours. CBC: Recent Labs  Lab 02/27/21 0716 03/03/21 0818 03/04/21 0844  WBC 7.7 11.1* 8.4  NEUTROABS 3.0 7.1 4.6  HGB 13.1 13.8 12.6  HCT 39.2 40.1 38.1  MCV 87.7 87.4 87.8  PLT 194 207 210   Cardiac Enzymes: No results for input(s): CKTOTAL, CKMB, CKMBINDEX, TROPONINI in the last 168 hours. BNP (last 3 results) Recent Labs    01/14/21 0238 01/15/21 0230 01/16/21 0201  BNP 177.8* 115.9* 40.9    ProBNP (last 3 results) No results for input(s): PROBNP in the last 8760 hours.  CBG: Recent Labs  Lab 03/04/21 0630 03/04/21 0745 03/04/21 1250 03/04/21 1614 03/04/21 1957  GLUCAP 494* 369* 271* 163* 136*    Recent Results (from the past 240 hour(s))  Culture, Urine     Status: Abnormal (Preliminary result)   Collection Time: 03/03/21  8:15 AM   Specimen: Urine, Catheterized  Result Value Ref Range Status   Specimen Description URINE, CATHETERIZED  Final   Special Requests NONE  Final   Culture (A)  Final    60,000 COLONIES/mL ESCHERICHIA COLI 10,000 COLONIES/mL KLEBSIELLA PNEUMONIAE SUSCEPTIBILITIES TO FOLLOW Performed at [facility], [address] [place], [place]    Report Status PENDING  Incomplete      Studies: CT HEAD WO CONTRAST  Result Date: 03/04/2021 CLINICAL DATA:  Left-sided weakness with altered mental status EXAM: CT HEAD WITHOUT CONTRAST TECHNIQUE: Contiguous axial images were obtained from the base of the skull through the vertex without intravenous contrast.  COMPARISON:  Head CT Jan 29, 2021; brain MRI Feb 02, 2021 FINDINGS: Brain: Moderate ventricular enlargement is stable. Milder sulcal prominence noted, stable. There is no appreciable intracranial mass, hemorrhage, extra-axial fluid collection, or midline shift. Decreased attenuation in portions of the centra semiovale bilaterally again noted. No acute infarct is appreciable on this study. Vascular: No hyperdense vessel. There is calcification in the distal right vertebral artery as well as in the carotid siphon regions. Skull: Bony calvarium appears intact. Sinuses/Orbits: Paranasal sinuses are clear. Orbits appear symmetric bilaterally. Other: Mastoid air cells are clear. IMPRESSION: Atrophy with suspected degree of concomitant communicating hydrocephalus, stable. Decreased attenuation in the periventricular white matter may represent small vessel vascular disease, interstitial edema/transependymal CSF flow, or a combination of both entities. No acute infarct evident. No mass or hemorrhage. Foci of arterial vascular calcification noted. Electronically Signed   By: Feb 04, 2021 III M.D.   On: 03/04/2021 11:34    Scheduled Meds:  amLODipine  5 mg Oral Daily   atorvastatin  20 mg Oral Daily   cholecalciferol  1,000 Units Oral Daily   divalproex  500 mg Oral BID   enoxaparin (LOVENOX) injection  40  mg Subcutaneous Q24H   escitalopram  20 mg Oral Daily   feeding supplement (GLUCERNA SHAKE)  237 mL Oral TID AC & HS   gabapentin  300 mg Oral BID   insulin aspart  0-20 Units Subcutaneous Q4H   insulin glargine  5 Units Subcutaneous QHS   magic mouthwash w/lidocaine  10 mL Oral QID   megestrol  800 mg Oral Daily   montelukast  10 mg Oral QHS   pantoprazole  40 mg Oral Daily   senna-docusate  2 tablet Oral Daily   thiamine  100 mg Oral Daily   Continuous Infusions:  sodium chloride 125 mL/hr at 03/03/21 1800   cefTRIAXone (ROCEPHIN)  IV 1 g (03/04/21 1700)     Principal Problem:   Bipolar 1  disorder, depressed, full remission (HCC) Active Problems:   Bipolar 1 disorder (HCC)   Hypertension   Generalized weakness   Controlled type 2 diabetes mellitus with hyperglycemia, with long-term current use of insulin (HCC)   Hyperlipidemia   NPH (normal pressure hydrocephalus) (HCC)   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   Consultants: Psychiatry Neurology Neurosurgery  Procedures: None  Antibiotics: Anti-infectives (From admission, onward)    Start     Dose/Rate Route Frequency Ordered Stop   03/04/21 1430  cefTRIAXone (ROCEPHIN) 1 g in sodium chloride 0.9 % 100 mL IVPB        1 g 200 mL/hr over 30 Minutes Intravenous Every 24 hours 03/04/21 1335 03/11/21 0959   02/14/21 0828  vancomycin (VANCOREADY) IVPB 1000 mg/200 mL  Status:  Discontinued        1,000 mg 200 mL/hr over 60 Minutes Intravenous 60 min pre-op 02/14/21 0828 03/02/21 1139   02/13/21 2115  cefTRIAXone (ROCEPHIN) 1 g in sodium chloride 0.9 % 100 mL IVPB  Status:  Discontinued        1 g 200 mL/hr over 30 Minutes Intravenous Every 24 hours 02/13/21 2016 02/16/21 0844        Time spent: 25 minutes    [name] ANP  [facility] 7 am - 330 pm/M-F for direct patient care and secure chat Please refer to Amion for contact info 52  days

## 2021-03-06 ENCOUNTER — Inpatient Hospital Stay (HOSPITAL_COMMUNITY): Payer: Medicaid Other

## 2021-03-06 LAB — GLUCOSE, CAPILLARY
Glucose-Capillary: 142 mg/dL — ABNORMAL HIGH (ref 70–99)
Glucose-Capillary: 160 mg/dL — ABNORMAL HIGH (ref 70–99)
Glucose-Capillary: 163 mg/dL — ABNORMAL HIGH (ref 70–99)
Glucose-Capillary: 166 mg/dL — ABNORMAL HIGH (ref 70–99)
Glucose-Capillary: 184 mg/dL — ABNORMAL HIGH (ref 70–99)
Glucose-Capillary: 307 mg/dL — ABNORMAL HIGH (ref 70–99)

## 2021-03-06 LAB — PROTEIN, CSF: Total  Protein, CSF: 32 mg/dL (ref 15–45)

## 2021-03-06 LAB — CSF CELL COUNT WITH DIFFERENTIAL
RBC Count, CSF: 14 /mm3 — ABNORMAL HIGH
Tube #: 1
WBC, CSF: 3 /mm3 (ref 0–5)

## 2021-03-06 LAB — BASIC METABOLIC PANEL
Anion gap: 6 (ref 5–15)
BUN: 7 mg/dL (ref 6–20)
CO2: 30 mmol/L (ref 22–32)
Calcium: 9.3 mg/dL (ref 8.9–10.3)
Chloride: 104 mmol/L (ref 98–111)
Creatinine, Ser: 0.86 mg/dL (ref 0.44–1.00)
GFR, Estimated: >60 mL/min (ref >60)
Glucose, Bld: 167 mg/dL — ABNORMAL HIGH (ref 70–99)
Potassium: 3.8 mmol/L (ref 3.5–5.1)
Sodium: 140 mmol/L (ref 135–145)

## 2021-03-06 LAB — GLUCOSE, CSF: Glucose, CSF: 105 mg/dL — ABNORMAL HIGH (ref 40–70)

## 2021-03-06 MED ORDER — LIDOCAINE HCL (PF) 1 % IJ SOLN
5.0000 mL | Freq: Once | INTRAMUSCULAR | Status: AC
  Administered 2021-03-06: 5 mL via INTRADERMAL

## 2021-03-06 MED ORDER — CEFAZOLIN SODIUM-DEXTROSE 1-4 GM/50ML-% IV SOLN
1.0000 g | Freq: Three times a day (TID) | INTRAVENOUS | Status: AC
  Administered 2021-03-06 – 2021-03-09 (×11): 1 g via INTRAVENOUS
  Filled 2021-03-06 (×12): qty 50

## 2021-03-06 NOTE — Progress Notes (Addendum)
[facility] PROGRESS NOTE  Haydyn Liddell OFB:[id number] DOB: [birth year] DOA: 01/11/2021 PCP: [name], FNP     Status: Remains inpatient appropriate because:Unsafe d/c plan-due to patient's underlying poor mental status short-term memory deficits she will require 24/7 care after discharge  Dispo:  Patient From:  Home  Planned Disposition: SNF  Medically stable for discharge:  yes  Barriers to DC: Continues to require significant assistance with mobility.  Continues to have significant short-term memory deficits impeding patient's ability to safely return home with elderly parents; please note that the patient's father continues to work outside of the home therefore the burden of care would follow-up on the patient's elderly mother             Difficult to place: Yes  Level of care: Med-Surg  Code Status: DNR Family Communication: 52/10 Mother [name] updated DVT prophylaxis: Lovenox COVID vaccination status: Unknown   HPI: 55 year old female past medical history of diabetes mellitus type 2, bipolar disorder type I, previous COVID infection, polysubstance abuse that includes cocaine.  She presented from home where she lives with her parents.  Her presenting symptoms included generalized weakness, multiple falls and confusion.  Family confirmed that she had been having the symptoms for at least a month with last use of crack cocaine 1 month prior to presentation.  Ever since that time she had not been doing well.  She would also be having issues with hyperglycemia.  In the ER she was diagnosed with severe dehydration, hyperglycemia and metabolic encephalopathy.  Since admission patient has been evaluated by the psychiatric team who has deemed her alert and oriented and having capacity to make decisions.  Patient is agreeable to placement at a rehab.  Currently she has severe physical deconditioning which was POA and PT is recommending SNF.  Because of her history of  polysubstance abuse and lack of funding SNF is not an option.  Family is agreeable to taking her back in home when she is able to ambulate well enough to get back and forth for meals into the bathroom.  Subjective: Awake in more communicative today than she has been in the past 2 days.  During my exam she has been oriented to place and somewhat to year although perseverated on the words 12:00 when asked time of day and other orientation questions.   Objective: Vitals:   03/05/21 2024 03/06/21 0358  BP: (!) 143/80 (!) 162/91  Pulse: 79 79  Resp:    Temp: 99.6 F (37.6 C) 98.8 F (37.1 C)  SpO2: 100% 99%    Intake/Output Summary (Last 24 hours) at 03/06/2021 7824 Last data filed at 03/06/2021 0600 Gross per 24 hour  Intake 589.93 ml  Output 750 ml  Net -160.07 ml   Filed Weights   01/11/21 1152  Weight: 99.8 kg    Exam:  Constitutional: In no acute distress, calm ENT: Not examined tongue today Respiratory:'s are clear to auscultation, she remains on 2 L oxygen with pulse oximetry 100% Cardiac: Heart sounds are normal, no peripheral edema, normotensive.  IV fluids continue at 125 cc/HR Abdomen: LBM 6/26, soft nontender nondistended normoactive bowel sounds Neurologic: Cranial nerves appear to be grossly intact on visual inspection.  Patient continues to have diffuse generalized weakness of extremities 3/5.  Inconsistently following commands. Psychiatric: Awake and oriented times year and place.  When asked time of day she stated "12:00".  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  Over  the past 3 weeks has had progressive decline in by 6/29 was profoundly lethargic Discussion with the neurological team it is suspected that patient has an underlying dementia process related to her previous significant polysubstance abuse on top of her known bipolar disorder; in conjunction with possible NPH physiology is likely what is causing her neurological changes. Large-volume LP completed today with follow-up neurological exam by Dr. Jerrell Belfast Patient may or may not have any improvement after large-volume LP so this should not be the deciding factor in patient's end-of-life decisions.  Dr. Jerrell Belfast agrees with not escalating care any further and continuing DNR but otherwise continue to monitor patient and treat treatable issues Unclear how much recent UTI contributed to her altered mentation Given her altered mentation recently before resuming oral diet we will ask for speech to reevaluate  Profound physical deconditioning Likely a combination of persistent altered mental status and inability to participate consistently with PT as well as neuropathy of critical illness  Ventriculomegaly/? NPH Did improve after large-volume LP several weeks ago but now with progressive mental status changes-repeat large-volume LP planned for 6/30 Neurosurgery reconsulted on 6/29   Acute hypoxemia/COPD Patient with subtle increased work of breathing and tachypnea ABG with PaO2 of 65.9 with baseline in December 2021 of 89 Mild elevation in bicarbonate with normal pH and normal CO2 Chest x-ray unremarkable Suspect hypoxemia secondary to altered mental status with subsequent hyperventilation Start 2 L nasal cannula oxygen and monitor pulse oximetry continuously.  Dehydration with acute kidney injury Peak creatinine 1.38 on 6/27.  This in context of hyperglycemia 513 Creatinine has decreased to 0.86 therefore will decrease IV fluids to 75 cc/h  Anorexia/Odynophagia Currently n.p.o. secondary to altered mental  status   E. Coli [name] UTI Urine cx + 60,000 colonies E. Coli-  Treated initially with empiric Rocephin but sensitive to Ancef so has been changed Suspect purewick contributing to recurrent UTI so order placed to apply only at [facility] 6/29 as a precaution we will check blood cultures  Diabetes mellitus 2 with hyperglycemia on insulin Hemoglobin A1c 11.0 Change Lantus to 5 units HS Continue every 4 hours SSI but increased to resistant scale Metformin discontinued 6/28 Unable to eat so IV fluids changed to D5NS w/ 20 KCL-decrease rate today to 75  Dyslipidemia Statin on hold while n.p.o.  History of bipolar 1 disorder/polysubstance abuse/cognitive impairment Continue to hold escitalopram, Neurontin and Depakote no n.p.o. Functional cognitive evaluation with significant abnormalities  Physical deconditioning/bilateral heel cord shortening Continue bilateral PRAFO boots on every 4 hours and off every 4 hours    Other problems: GERD Oral PPI on hold while n.p.o.  Hypokalemia/hypomagnesemia Resolved  Data Reviewed: Basic Metabolic Panel: Recent Labs  Lab 03/03/21 0818 03/04/21 0844 03/05/21 0856 03/06/21 0412  NA 131* 134* 136 140  K 5.0 3.7 4.0 3.8  CL 92* 94* 99 104  CO2 21* 27 28 30   GLUCOSE 513* 348* 299* 167*  BUN 20 19 14 7   CREATININE 1.38* 1.17* 1.01* 0.86  CALCIUM 9.1 9.1 9.2 9.3   Liver Function Tests: Recent Labs  Lab 03/03/21 0818 03/05/21 0856  AST 14* 14*  ALT 14 11  ALKPHOS 48 42  BILITOT 1.1 0.4  PROT 6.5 6.1*  ALBUMIN 3.0* 2.7*  No results for input(s): LIPASE, AMYLASE in the last 168 hours. No results for input(s): AMMONIA in the last 168 hours. CBC: Recent Labs  Lab 03/03/21 0818 03/04/21 0844  WBC 11.1* 8.4  NEUTROABS 7.1 4.6  HGB 13.8 12.6  HCT 40.1 38.1  MCV 87.4 87.8  PLT 207 210   Cardiac Enzymes: No results for input(s): CKTOTAL, CKMB, CKMBINDEX, TROPONINI in the last 168 hours. BNP (last 3 results) Recent Labs     01/14/21 0238 01/15/21 0230 01/16/21 0201  BNP 177.8* 115.9* 40.9    ProBNP (last 3 results) No results for input(s): PROBNP in the last 8760 hours.  CBG: Recent Labs  Lab 03/05/21 1224 03/05/21 1737 03/05/21 2026 03/06/21 0036 03/06/21 0352  GLUCAP 133* 157* 205* 166* 142*    Recent Results (from the past 240 hour(s))  Culture, Urine     Status: Abnormal   Collection Time: 03/03/21  8:15 AM   Specimen: Urine, Catheterized  Result Value Ref Range Status   Specimen Description URINE, CATHETERIZED  Final   Special Requests   Final    NONE Performed at [facility], [address] [place], [place] [address]    Culture (A)  Final    60,000 COLONIES/mL ESCHERICHIA COLI 10,000 COLONIES/mL KLEBSIELLA PNEUMONIAE    Report Status 03/05/2021 FINAL  Final   Organism ID, Bacteria ESCHERICHIA COLI (A)  Final   Organism ID, Bacteria KLEBSIELLA PNEUMONIAE (A)  Final      Susceptibility   Escherichia coli - MIC*    AMPICILLIN >=32 RESISTANT Resistant     CEFAZOLIN <=4 SENSITIVE Sensitive     CEFEPIME <=0.12 SENSITIVE Sensitive     CEFTRIAXONE <=0.25 SENSITIVE Sensitive     CIPROFLOXACIN >=4 RESISTANT Resistant     GENTAMICIN >=16 RESISTANT Resistant     IMIPENEM <=0.25 SENSITIVE Sensitive     NITROFURANTOIN <=16 SENSITIVE Sensitive     TRIMETH/SULFA <=20 SENSITIVE Sensitive     AMPICILLIN/SULBACTAM 16 INTERMEDIATE Intermediate     PIP/TAZO <=4 SENSITIVE Sensitive     * 60,000 COLONIES/mL ESCHERICHIA COLI   Klebsiella pneumoniae - MIC*    AMPICILLIN RESISTANT Resistant     CEFAZOLIN <=4 SENSITIVE Sensitive     CEFEPIME <=0.12 SENSITIVE Sensitive     CEFTRIAXONE <=0.25 SENSITIVE Sensitive     CIPROFLOXACIN <=0.25 SENSITIVE Sensitive     GENTAMICIN <=1 SENSITIVE Sensitive     IMIPENEM <=0.25 SENSITIVE Sensitive     NITROFURANTOIN 64 INTERMEDIATE Intermediate     TRIMETH/SULFA <=20 SENSITIVE Sensitive     AMPICILLIN/SULBACTAM <=2 SENSITIVE Sensitive     PIP/TAZO <=4  SENSITIVE Sensitive     * 10,000 COLONIES/mL KLEBSIELLA PNEUMONIAE  Culture, blood (Routine X 2) w Reflex to ID Panel     Status: None (Preliminary result)   Collection Time: 03/05/21  1:06 PM   Specimen: BLOOD LEFT HAND  Result Value Ref Range Status   Specimen Description BLOOD LEFT HAND  Final   Special Requests   Final    BOTTLES DRAWN AEROBIC AND ANAEROBIC Blood Culture adequate volume   Culture   Final    NO GROWTH < 24 HOURS Performed at [facility], [address], [place], [place] [address]    Report Status PENDING  Incomplete  Culture, blood (Routine X 2) w Reflex to ID Panel     Status: None (Preliminary result)   Collection Time: 03/05/21  1:14 PM   Specimen: BLOOD RIGHT HAND  Result Value Ref Range Status  Specimen Description BLOOD RIGHT HAND  Final   Special Requests   Final    BOTTLES DRAWN AEROBIC AND ANAEROBIC Blood Culture adequate volume   Culture   Final    NO GROWTH < 24 HOURS Performed at [facility], [address] [place], [place] [id number]    Report Status PENDING  Incomplete      Studies: CT HEAD WO CONTRAST  Result Date: 03/04/2021 CLINICAL DATA:  Left-sided weakness with altered mental status EXAM: CT HEAD WITHOUT CONTRAST TECHNIQUE: Contiguous axial images were obtained from the base of the skull through the vertex without intravenous contrast. COMPARISON:  Head CT Jan 29, 2021; brain MRI Feb 02, 2021 FINDINGS: Brain: Moderate ventricular enlargement is stable. Milder sulcal prominence noted, stable. There is no appreciable intracranial mass, hemorrhage, extra-axial fluid collection, or midline shift. Decreased attenuation in portions of the centra semiovale bilaterally again noted. No acute infarct is appreciable on this study. Vascular: No hyperdense vessel. There is calcification in the distal right vertebral artery as well as in the carotid siphon regions. Skull: Bony calvarium appears intact. Sinuses/Orbits: Paranasal sinuses are  clear. Orbits appear symmetric bilaterally. Other: Mastoid air cells are clear. IMPRESSION: Atrophy with suspected degree of concomitant communicating hydrocephalus, stable. Decreased attenuation in the periventricular white matter may represent small vessel vascular disease, interstitial edema/transependymal CSF flow, or a combination of both entities. No acute infarct evident. No mass or hemorrhage. Foci of arterial vascular calcification noted. Electronically Signed   By: Bretta BangWilliam  Woodruff III M.D.   On: 03/04/2021 11:34   DG CHEST PORT 1 VIEW  Result Date: 03/05/2021 CLINICAL DATA:  55 year old female with history of aspiration pneumonia. EXAM: PORTABLE CHEST - 1 VIEW COMPARISON:  02/13/2021 FINDINGS: The mediastinal contours are within normal limits. No cardiomegaly. The lungs are clear bilaterally without evidence of focal consolidation, pleural effusion, or pneumothorax. No acute osseous abnormality. IMPRESSION: No acute cardiopulmonary process. Electronically Signed   By: Marliss Cootsylan  Suttle MD   On: 03/05/2021 11:22    Scheduled Meds:  enoxaparin (LOVENOX) injection  40 mg Subcutaneous Q24H   insulin aspart  0-20 Units Subcutaneous Q4H   insulin glargine  5 Units Subcutaneous QHS   magic mouthwash w/lidocaine  10 mL Oral QID   Continuous Infusions:  cefTRIAXone (ROCEPHIN)  IV 1 g (03/05/21 0848)   dextrose 5 % and 0.9 % NaCl with KCl 20 mEq/L 125 mL/hr at 03/06/21 0558   levETIRAcetam 500 mg (03/05/21 2224)     Principal Problem:   Bipolar 1 disorder, depressed, full remission (HCC) Active Problems:   Bipolar 1 disorder (HCC)   Hypertension   Generalized weakness   Controlled type 2 diabetes mellitus with hyperglycemia, with long-term current use of insulin (HCC)   Hyperlipidemia   NPH (normal pressure hydrocephalus) (HCC)   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   Consultants: Psychiatry Neurology Neurosurgery  Procedures: None  Antibiotics: Anti-infectives (From admission,  onward)    Start     Dose/Rate Route Frequency Ordered Stop   03/04/21 1430  cefTRIAXone (ROCEPHIN) 1 g in sodium chloride 0.9 % 100 mL IVPB        1 g 200 mL/hr over 30 Minutes Intravenous Every 24 hours 03/04/21 1335 03/11/21 0959   02/14/21 0828  vancomycin (VANCOREADY) IVPB 1000 mg/200 mL  Status:  Discontinued        1,000 mg 200 mL/hr over 60 Minutes Intravenous 60 min pre-op 02/14/21 0828 03/02/21 1139   02/13/21 2115  cefTRIAXone (ROCEPHIN) 1  g in sodium chloride 0.9 % 100 mL IVPB  Status:  Discontinued        1 g 200 mL/hr over 30 Minutes Intravenous Every 24 hours 02/13/21 2016 02/16/21 0844        Time spent: 25 minutes    [name] ANP  [facility] 7 am - 330 pm/M-F for direct patient care and secure chat Please refer to Amion for contact info 53  days

## 2021-03-06 NOTE — Plan of Care (Signed)
Brief Neurology note:  Came back to see patient after her large volume tap performed this a.m. She is sitting in chair with assistance of PT. NP can not see PTs evaluation note after LP. Patient's mental status is unchanged from pre tap. She remains disoriented except to self. She continues to perseverate with asked questions. Her grips are with some effort against gravity, but she will not participate in rest of strength exam believe to be secondary to her cognitive state. She can not lift either leg off chair.   Jimmye Norman, NP Neurology  NO CHARGE.

## 2021-03-06 NOTE — Progress Notes (Signed)
Neurology Progress Note  S: Patient has continued to decline and her SNF bed was revoked for reason unknown to NP.  Patient says she is feeling good except for a rubber band like HA. She responds yes to every other question in ROS, so NP unsure if this is true or she is perseverating.  She appears more ill than the last time NP saw patient.   Per last PT note, patient requires total assist for walking and other activities.   O: Current vital signs: BP (!) 162/91   Pulse 79   Temp 98.8 F (37.1 C)   Resp 20   Ht 5\' 6"  (1.676 m)   Wt 99.8 kg   LMP 01/26/2017 (Approximate)   SpO2 99%   BMI 35.51 kg/m  Vital signs in last 24 hours: Temp:  [98.8 F (37.1 C)-99.6 F (37.6 C)] 98.8 F (37.1 C) (06/30 0358) Pulse Rate:  [79-83] 79 (06/30 0358) BP: (137-162)/(70-91) 162/91 (06/30 0358) SpO2:  [99 %-100 %] 99 % (06/30 0358)  GENERAL: Awake, alert in NAD. HEENT: Normocephalic and atraumatic. LUNGS: Normal respiratory effort.  ABDOMEN: Soft, nontender. Ext: warm. LEs in boots.   NEURO:  Mental Status: Alert, confused. Says Hey love when NP says Hi. Oriented to name. Disoriented to age, place, month, day, date, year. When answering orientation questions, she just repeats 16, 7. Will follow a few simple commands and not others. Can not tell NP how many quarters are in $2.75, she replied 16, 7.  Speech/Language: speech is without aphasia or dysarthria.  Bradyphrenia.   Cranial Nerves:  II: PERRL. .  III, IV, VI: Eyelids elevate symmetrically.  V: Sensation is intact to light touch and symmetrical to face.  VII: Smile is symmetrical.  VIII: hearing intact to voice. IX, X: Palate elevates symmetrically. Phonation is normal.  06[birth year]1984 shrug 5/5. XII: tongue is midline without fasciculations. Motor: LUE grip, triceps, and biceps 4-/5. She does not participate in RUE. Able to wiggle left toes, but does not wiggle right. Does not lift legs when asked.  Sensation- Intact to light touch  bilaterally.  Coordination: Will not perform testing.  DTRs: Unable to assess due to patient's tense state in extremities. Gait- deferred.   Attending addendum Patient seen and examined: Awake alert oriented to self, fact that she is in the hospital, not to the month but to the ER, was able to name the president.  No evidence of aphasia.  Was able to follow simple commands.  No cranial nerve deficits.  Is antigravity in both upper extremities with at least 4+/5.  On the lower extremities, only able to wiggle toes without being able to lift leg off the bed bilaterally.  DTRs absent.  Medications  Current Facility-Administered Medications:    acetaminophen (TYLENOL) tablet 650 mg, 650 mg, Oral, Q6H PRN, Hall, Carole N, DO, 650 mg at 02/13/21 2300   albuterol (PROVENTIL) (2.5 MG/3ML) 0.083% nebulizer solution 3 mL, 3 mL, Nebulization, Q6H PRN, 04/15/21, Carole N, DO, 3 mL at 02/27/21 0059   bisacodyl (DULCOLAX) suppository 10 mg, 10 mg, Rectal, Daily PRN, 03/01/21, MD   ceFAZolin (ANCEF) IVPB 1 g/50 mL premix, 1 g, Intravenous, Q8H, Tyrone Nine L, NP   dextrose 5 % and 0.9 % NaCl with KCl 20 mEq/L infusion, , Intravenous, Continuous, [name], NP, Last Rate: 125 mL/hr at 03/06/21 0558, New Bag at 03/06/21 0558   dextrose 50 % solution 0-50 mL, 0-50 mL, Intravenous, PRN, 08[birth year]1974, MD, 50 mL  at 02/19/21 1628   enoxaparin (LOVENOX) injection 40 mg, 40 mg, Subcutaneous, Q24H, [name] L, NP, 40 mg at 03/05/21 8242   insulin aspart (novoLOG) injection 0-20 Units, 0-20 Units, Subcutaneous, Q4H, Russella Dar, NP, 4 Units at 03/06/21 0810   insulin glargine (LANTUS) injection 5 Units, 5 Units, Subcutaneous, QHS, Russella Dar, NP, 5 Units at 03/05/21 2103   levETIRAcetam (KEPPRA) IVPB 500 mg/100 mL premix, 500 mg, Intravenous, Q12H, Russella Dar, NP, Last Rate: 400 mL/hr at 03/05/21 2224, 500 mg at 03/05/21 2224   magic mouthwash w/lidocaine, 10 mL, Oral, QID, Russella Dar, NP, 10 mL at 03/05/21 2103   ondansetron (ZOFRAN) injection 4 mg, 4 mg, Intravenous, Q6H PRN, Dow Adolph N, DO, 4 mg at 02/03/21 1946  No new Imaging.  Assessment: 55 yo female with PMHx polysubstance abuse with prolonged hospitalization. Since, prior Neurology consult and progress notes, patient has continued to decline in functionality and cognition.  That said, she is better than 2 days ago when she was very encephalopathic-she has received IV fluids and treatment for UTI. Concern is that she has symptom worsening due to multiple reasons: - Critical care myopathy and neuropathy - Possible worsening from normal pressure hydrocephalus A large-volume LP might be helpful and seeing if the symptoms get any better with removal of the CSF.   Impression: -concern for NPH.  -progressive cognitive and functional decline due to NPH vs her polysubstance abuse.  -Multifactorial encephalopathy.   Plan: -Examine patient again after large lumbar tap today.  -[facility] on board for possible lumbar drain trial or shunting as needed-but have recommended outpatient follow-up. -family plans to transition to comfort measures if she does not show improvement after spinal tap but I am not sure if the component of NPH alone can explain her decline.  She has multifactorial encephalopathy due to prior history of substance abuse/alcohol abuse and prolonged hospitalization is not helping her in terms of recovery towards baseline.  Pt seen by Jimmye Norman, MSN, APN-BC/Nurse Practitioner/Neuro and later by MD. Note and plan to be edited as needed by MD.  Pager: [phone]    Attending Neurohospitalist Addendum Patient seen and examined with APP/Resident. Agree with the history and physical as documented above. Agree with the plan as documented, which I helped formulate. I have independently reviewed the chart, obtained history, review of systems and examined the patient.I have personally reviewed  pertinent head/neck/spine imaging (CT/MRI). Please feel free to call with any questions. -- Milon Dikes, MD Neurologist [facility] Pager: [phone]

## 2021-03-06 NOTE — Plan of Care (Signed)

## 2021-03-06 NOTE — Procedures (Signed)
Procedure: LP with flouro guidance at L1/2 - large volume Specimen: CSF, to lab Bleeding: minimal. Complications: None immediate. Patient   -Condition: Stable.  -Disposition:  inpatient, back to floor.  Full Radiology Report to follow under IMAGING

## 2021-03-06 NOTE — Progress Notes (Signed)
SLP Cancellation Note  Patient Details Name: Carolyn Norton MRN: [id number] DOB: 1966-04-18   Cancelled treatment:       Reason Eval/Treat Not Completed: Patient not medically ready. Pt back from LP, must remain flat for 4 hours   Albi Rappaport, Riley Nearing 03/06/2021, 10:41 AM

## 2021-03-06 NOTE — Progress Notes (Signed)
Physical Therapy Note  Patient Details  Name: Carolyn Norton MRN: [id number] Date of Birth: December 12, 1965 Today's Date: 03/06/2021    Patient seen post LP for NPH post procedure evaluation.  Pre-procedural eval not performed and post eval performed 5 hours after procedure due to delay in communication.  Please see shadow chart for details.    Sheran Lawless, PT Acute Rehabilitation Services Pager:[phone] Office:[phone] 03/06/2021  [name] 03/06/2021, 4:42 PM

## 2021-03-07 DIAGNOSIS — R4189 Other symptoms and signs involving cognitive functions and awareness: Secondary | ICD-10-CM

## 2021-03-07 LAB — GLUCOSE, CAPILLARY
Glucose-Capillary: 103 mg/dL — ABNORMAL HIGH (ref 70–99)
Glucose-Capillary: 120 mg/dL — ABNORMAL HIGH (ref 70–99)
Glucose-Capillary: 215 mg/dL — ABNORMAL HIGH (ref 70–99)
Glucose-Capillary: 261 mg/dL — ABNORMAL HIGH (ref 70–99)
Glucose-Capillary: 287 mg/dL — ABNORMAL HIGH (ref 70–99)
Glucose-Capillary: 300 mg/dL — ABNORMAL HIGH (ref 70–99)

## 2021-03-07 LAB — VITAMIN B6: Vitamin B6: 11 ug/L (ref 3.4–65.2)

## 2021-03-07 MED ORDER — INSULIN ASPART 100 UNIT/ML IJ SOLN
0.0000 [IU] | Freq: Three times a day (TID) | INTRAMUSCULAR | Status: DC
  Administered 2021-03-07: 11 [IU] via SUBCUTANEOUS
  Administered 2021-03-08 (×2): 7 [IU] via SUBCUTANEOUS
  Administered 2021-03-08: 11 [IU] via SUBCUTANEOUS
  Administered 2021-03-09: 4 [IU] via SUBCUTANEOUS
  Administered 2021-03-09: 11 [IU] via SUBCUTANEOUS
  Administered 2021-03-09: 7 [IU] via SUBCUTANEOUS
  Administered 2021-03-09: 4 [IU] via SUBCUTANEOUS
  Administered 2021-03-10: 11 [IU] via SUBCUTANEOUS
  Administered 2021-03-10: 7 [IU] via SUBCUTANEOUS
  Administered 2021-03-10: 11 [IU] via SUBCUTANEOUS
  Administered 2021-03-11: 3 [IU] via SUBCUTANEOUS
  Administered 2021-03-11: 7 [IU] via SUBCUTANEOUS
  Administered 2021-03-11: 4 [IU] via SUBCUTANEOUS
  Administered 2021-03-11: 7 [IU] via SUBCUTANEOUS
  Administered 2021-03-12: 15 [IU] via SUBCUTANEOUS
  Administered 2021-03-12: 4 [IU] via SUBCUTANEOUS
  Administered 2021-03-12: 15 [IU] via SUBCUTANEOUS
  Administered 2021-03-12: 11 [IU] via SUBCUTANEOUS
  Administered 2021-03-13: 20 [IU] via SUBCUTANEOUS
  Administered 2021-03-13: 7 [IU] via SUBCUTANEOUS
  Administered 2021-03-13: 20 [IU] via SUBCUTANEOUS
  Administered 2021-03-14: 15 [IU] via SUBCUTANEOUS

## 2021-03-07 NOTE — Plan of Care (Signed)
  Problem: Clinical Measurements: Goal: Will remain free from infection Outcome: Progressing Goal: Respiratory complications will improve Outcome: Progressing Goal: Cardiovascular complication will be avoided Outcome: Progressing   

## 2021-03-07 NOTE — Progress Notes (Signed)
Occupational Therapy Treatment Patient Details Name: Carolyn Norton MRN: [id number] DOB: [birth year] Today's Date: 03/07/2021    History of present illness Pt is a 55 y.o. female admitted 01/11/21 with multiple falls, AMS, weakness. Workup for severe dehydration, DKA, metabolic encephalopathy. 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 Neurosurgery following for possible lumbar drain trial vs repeat LP vs VP shunt placement. Placement of lumbar drain on 6/10 cancelled due to UTI. PMH includes DM2, COPD, CKD3, GERD, bipolar disorder, COVID-19, polysubstance abuse including cocaine.   OT comments  Patient with further functional and cognitive decline following 1/5 1-step verbal commands with more than increased time and Max multimodal cues. Patient also requiring Total A to Total A +2 grossly for bed level ADLs including grooming. Given decline in function, goals downgraded and frequency decreased to 1x weekly. OT will continue to follow acutely.    Follow Up Recommendations  SNF;Supervision/Assistance - 24 hour    Equipment Recommendations  None recommended by OT    Recommendations for Other Services      Precautions / Restrictions Precautions Precautions: Fall Restrictions Weight Bearing Restrictions: No       Mobility Bed Mobility Overal bed mobility: Needs Assistance Bed Mobility: Rolling;Sidelying to Sit;Sit to Sidelying Rolling: Max assist;+2 for physical assistance Sidelying to sit: HOB elevated;Total assist     Sit to sidelying: Max assist;+2 for physical assistance General bed mobility comments: Max A for all parts of bed mobility. Patient given 1-step verbal commands but after >15 seconds, patient still not performing task (i.e. turning head or reaching for rail).    Transfers                 General transfer comment: Deferred for patient/therapist safety.    Balance       Sitting balance - Comments: Did not attempt this date for  patient/therapist safety.                                   ADL either performed or assessed with clinical judgement   ADL                       Lower Body Dressing: Total assistance;Bed level Lower Body Dressing Details (indicate cue type and reason): Total A to don mesh underwear in supine. Attempts to lift RLE but does not attempt to lift LLE. Total A for rolling R<>L to hike over hips.               General ADL Comments: Grossly dependent     Vision       Perception     Praxis      Cognition Arousal/Alertness: Awake/alert Behavior During Therapy: Flat affect Overall Cognitive Status: Difficult to assess                     Current Attention Level: Focused   Following Commands: Follows one step commands inconsistently;Follows one step commands with increased time     Problem Solving: Slow processing;Difficulty sequencing;Requires verbal cues;Decreased initiation;Requires tactile cues General Comments: More awake this date compared to previous OT session. Patient with poor and worsening command following; following 1/5 commands even with hand over hand assist and Max multimodal cues.        Exercises Exercises: Other exercises Other Exercises Other Exercises: BUE AAROM/PROM   Shoulder Instructions       General Comments  VSS on 2L O2 via [place].    Pertinent Vitals/ Pain       Pain Assessment: No/denies pain  Home Living                                          Prior Functioning/Environment              Frequency  Min 1X/week        Progress Toward Goals  OT Goals(current goals can now be found in the care plan section)  Progress towards OT goals: Not progressing toward goals - comment;Goals drowngraded-see care plan  Acute Rehab OT Goals Patient Stated Goal: None stated. OT Goal Formulation: Patient unable to participate in goal setting Time For Goal Achievement: 03/14/21 Potential to  Achieve Goals: Poor ADL Goals Additional ADL Goal #1: Patient will complete 50% of meal with Mod A and Max multimodal cues. Additional ADL Goal #2: Patient will follow 1-step verbal commands with 50% accuracy in prep for ADLs. Additional ADL Goal #3: Patient will complete 3/3 grooming tasks in supported sitting position with Mod A and Max multimodal cues.  Plan Discharge plan remains appropriate;Frequency needs to be updated    Co-evaluation                 AM-PAC OT "6 Clicks" Daily Activity     Outcome Measure   Help from another person eating meals?: A Lot Help from another person taking care of personal grooming?: Total Help from another person toileting, which includes using toliet, bedpan, or urinal?: Total Help from another person bathing (including washing, rinsing, drying)?: Total Help from another person to put on and taking off regular upper body clothing?: Total Help from another person to put on and taking off regular lower body clothing?: Total 6 Click Score: 7    End of Session Equipment Utilized During Treatment: Oxygen  OT Visit Diagnosis: Unsteadiness on feet (R26.81);Other abnormalities of gait and mobility (R26.89);Muscle weakness (generalized) (M62.81);Other symptoms and signs involving cognitive function;Other symptoms and signs involving the nervous system (R29.898)   Activity Tolerance Patient tolerated treatment well   Patient Left in bed;with call bell/phone within reach;with bed alarm set;with nursing/sitter in room;Other (comment)   Nurse Communication Other (comment) (Response to treatment and poor command following.)        Time: 1610-9604 OT Time Calculation (min): 33 min  Charges: OT General Charges $OT Visit: 1 Visit OT Treatments $Self Care/Home Management : 8-22 mins $Therapeutic Activity: 8-22 mins  Lian Pounds H. OTR/L Supplemental OT, Department of rehab services [phone]   Dakari Stabler R H. 03/07/2021, 8:54 AM

## 2021-03-07 NOTE — Progress Notes (Signed)
[facility] PROGRESS NOTE  Carolyn Norton ZOX:[id number] DOB: 02-Apr-1966 DOA: 01/11/2021 PCP: [name], FNP     Status: Remains inpatient appropriate because:Unsafe d/c plan-due to patient's underlying poor mental status short-term memory deficits she will require 24/7 care after discharge  Dispo:  Patient From:  Home  Planned Disposition: SNF  Medically stable for discharge:  yes  Barriers to DC: Continues to require significant assistance with mobility.  Continues to have significant short-term memory deficits impeding patient's ability to safely return home with elderly parents; please note that the patient's father continues to work outside of the home therefore the burden of care would follow-up on the patient's elderly mother             Difficult to place: Yes  Level of care: Med-Surg  Code Status: DNR Family Communication: I updated patient's father via telephonic message (I have called this number multiple times and was able to leave appropriate message) detailing findings from large-volume LP and that suspicion that acute changes in her mentation were more related to the urinary tract infection.  No indication for VP shunt and plan is to continue focus on improving physical activity and appetite. DVT prophylaxis: Lovenox COVID vaccination status: Unknown   HPI: 55 year old female past medical history of diabetes mellitus type 2, bipolar disorder type I, previous COVID infection, polysubstance abuse that includes cocaine.  She presented from home where she lives with her parents.  Her presenting symptoms included generalized weakness, multiple falls and confusion.  Family confirmed that she had been having the symptoms for at least a month with last use of crack cocaine 1 month prior to presentation.  Ever since that time she had not been doing well.  She would also be having issues with hyperglycemia.  In the ER she was diagnosed with severe dehydration,  hyperglycemia and metabolic encephalopathy.  Since admission patient has been evaluated by the psychiatric team who has deemed her alert and oriented and having capacity to make decisions.  Patient is agreeable to placement at a rehab.  Currently she has severe physical deconditioning which was POA and PT is recommending SNF.  Because of her history of polysubstance abuse and lack of funding SNF is not an option.  Family is agreeable to taking her back in home when she is able to ambulate well enough to get back and forth for meals into the bathroom.  Subjective: Sleeping upon my entry into the room.  She awakened.  Was smiling and very pleasant.  Was able to answer a few orientation questions appropriately but unfortunately after I asked her the year she continued to perseverate on 2022.   Objective: Vitals:   03/06/21 0358 03/06/21 1332  BP: (!) 162/91 (!) 149/79  Pulse: 79 79  Resp:  16  Temp: 98.8 F (37.1 C) 98.1 F (36.7 C)  SpO2: 99% 100%    Intake/Output Summary (Last 24 hours) at 03/07/2021 0728 Last data filed at 03/06/2021 2108 Gross per 24 hour  Intake 550 ml  Output --  Net 550 ml   Filed Weights   01/11/21 1152  Weight: 99.8 kg    Exam:  Constitutional: Awakens easily.  Alert when awake.  Pleasant affect Respiratory: Bilateral lung sounds remain clear.  No increased work of breathing.  She is stable on room air. Cardiac: Normal heart sounds, no peripheral edema, normotensive Abdomen: LBM 6/26, soft and nontender with continued poor oral intake. Neurologic: Cranial nerves are grossly intact.  Patient continues to demonstrate  extremity weakness with upper extremity dysmetria persisting.  Lower extremity strength 2/5. 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  Over the past 3 weeks has had progressive decline in by 6/29 was profoundly lethargic Discussion with the neurological team it is suspected that patient has an underlying dementia process related to her previous significant polysubstance abuse on top of her known bipolar disorder; in conjunction with possible NPH physiology is likely what is causing her neurological changes. Large-volume LP completed today with follow-up neurological exam by Dr. Con Memos communication issue re timing of PT eval related to LP-despite this both nursing and medical staff did not see any significant changes in her mentation or mobility/strength. Suspect cognitive impairment minimally (if at all) influenced by her ventriculomegly Suspect recent rapid decline in mentation 2/2 UTI notes after 48 hrs of antibiotics her acute lethargy had resolved Dr. Wilford Corner agrees with not escalating care any further and continuing DNR but otherwise continue to monitor patient and treat treatable issues.  Of note family does not want PEG tube Given her altered mentation recently before resuming oral diet we will ask for speech to reevaluate  Profound physical deconditioning Likely a combination of persistent altered mental status and inability to participate consistently with PT as well as myopathy of critical illness  Ventriculomegaly/? NPH Did improve after large-volume LP several weeks ago but now with progressive mental status changes-repeat large-volume LP 6/30 w/o any significant changes so unlikely NPH etiology to cognitive impairment Neurosurgery reconsulted on 6/29   Acute hypoxemia/COPD Patient with subtle increased work of breathing and tachypnea ABG with PaO2 of 65.9 with baseline in December 2021 of 89 Mild elevation in bicarbonate with normal  pH and normal CO2 Chest x-ray unremarkable Suspect hypoxemia secondary to altered mental status with subsequent hyperventilation Start 2 L nasal cannula oxygen and monitor pulse oximetry continuously.  Dehydration with acute kidney injury Peak creatinine 1.38 on 6/27.  This in context of hyperglycemia 513 Creatinine has decreased to 0.86 therefore will decrease IV fluids to 75 cc/h  Anorexia/Odynophagia Currently n.p.o. secondary to altered mental status and awaiting reevaluation by speech therapy Once oral diet resume please reorder Megace   E. Coli [name] UTI Urine cx + 60,000 colonies E. Coli-  Treated initially with empiric Rocephin but sensitive to Ancef so has been changed Suspect purewick contributing to recurrent UTI so order placed to apply only at [facility] 6/29 as a precaution we will check blood cultures  Diabetes mellitus 2 with hyperglycemia on insulin Hemoglobin A1c 11.0 Change Lantus to 5 units HS Continue every 4 hours SSI but increased to resistant scale Metformin discontinued 6/28 Unable to eat so IV fluids changed to D5NS w/ 20 KCL @ 75/hr  Dyslipidemia Statin on hold while n.p.o.  History of bipolar 1 disorder/polysubstance abuse/cognitive impairment Continue to hold escitalopram, Neurontin and Depakote no n.p.o. Functional cognitive evaluation with significant abnormalities  Physical deconditioning/bilateral heel cord shortening Continue bilateral PRAFO boots on every 4 hours and off every 4 hours    Other problems: GERD Oral PPI on hold while n.p.o.  Hypokalemia/hypomagnesemia Resolved  Data Reviewed: Basic Metabolic Panel: Recent Labs  Lab 03/03/21 0818 03/04/21 0844 03/05/21 0856 03/06/21 0412  NA 131* 134* 136  140  K 5.0 3.7 4.0 3.8  CL 92* 94* 99 104  CO2 21* 27 28 30   GLUCOSE 513* 348* 299* 167*  BUN 20 19 14 7   CREATININE 1.38* 1.17* 1.01* 0.86  CALCIUM 9.1 9.1 9.2 9.3   Liver Function Tests: Recent Labs  Lab 03/03/21 0818  03/05/21 0856  AST 14* 14*  ALT 14 11  ALKPHOS 48 42  BILITOT 1.1 0.4  PROT 6.5 6.1*  ALBUMIN 3.0* 2.7*   No results for input(s): LIPASE, AMYLASE in the last 168 hours. No results for input(s): AMMONIA in the last 168 hours. CBC: Recent Labs  Lab 03/03/21 0818 03/04/21 0844  WBC 11.1* 8.4  NEUTROABS 7.1 4.6  HGB 13.8 12.6  HCT 40.1 38.1  MCV 87.4 87.8  PLT 207 210   Cardiac Enzymes: No results for input(s): CKTOTAL, CKMB, CKMBINDEX, TROPONINI in the last 168 hours. BNP (last 3 results) Recent Labs    01/14/21 0238 01/15/21 0230 01/16/21 0201  BNP 177.8* 115.9* 40.9    ProBNP (last 3 results) No results for input(s): PROBNP in the last 8760 hours.  CBG: Recent Labs  Lab 03/06/21 1114 03/06/21 1650 03/06/21 2055 03/07/21 0012 03/07/21 0421  GLUCAP 160* 184* 307* 261* 103*    Recent Results (from the past 240 hour(s))  Culture, Urine     Status: Abnormal   Collection Time: 03/03/21  8:15 AM   Specimen: Urine, Catheterized  Result Value Ref Range Status   Specimen Description URINE, CATHETERIZED  Final   Special Requests   Final    NONE Performed at [facility], [address], [place], [address] [place]    Culture (A)  Final    60,000 COLONIES/mL ESCHERICHIA COLI 10,000 COLONIES/mL KLEBSIELLA PNEUMONIAE    Report Status 03/05/2021 FINAL  Final   Organism ID, Bacteria ESCHERICHIA COLI (A)  Final   Organism ID, Bacteria KLEBSIELLA PNEUMONIAE (A)  Final      Susceptibility   Escherichia coli - MIC*    AMPICILLIN >=32 RESISTANT Resistant     CEFAZOLIN <=4 SENSITIVE Sensitive     CEFEPIME <=0.12 SENSITIVE Sensitive     CEFTRIAXONE <=0.25 SENSITIVE Sensitive     CIPROFLOXACIN >=4 RESISTANT Resistant     GENTAMICIN >=16 RESISTANT Resistant     IMIPENEM <=0.25 SENSITIVE Sensitive     NITROFURANTOIN <=16 SENSITIVE Sensitive     TRIMETH/SULFA <=20 SENSITIVE Sensitive     AMPICILLIN/SULBACTAM 16 INTERMEDIATE Intermediate     PIP/TAZO <=4  SENSITIVE Sensitive     * 60,000 COLONIES/mL ESCHERICHIA COLI   Klebsiella pneumoniae - MIC*    AMPICILLIN RESISTANT Resistant     CEFAZOLIN <=4 SENSITIVE Sensitive     CEFEPIME <=0.12 SENSITIVE Sensitive     CEFTRIAXONE <=0.25 SENSITIVE Sensitive     CIPROFLOXACIN <=0.25 SENSITIVE Sensitive     GENTAMICIN <=1 SENSITIVE Sensitive     IMIPENEM <=0.25 SENSITIVE Sensitive     NITROFURANTOIN 64 INTERMEDIATE Intermediate     TRIMETH/SULFA <=20 SENSITIVE Sensitive     AMPICILLIN/SULBACTAM <=2 SENSITIVE Sensitive     PIP/TAZO <=4 SENSITIVE Sensitive     * 10,000 COLONIES/mL KLEBSIELLA PNEUMONIAE  Culture, blood (Routine X 2) w Reflex to ID Panel     Status: None (Preliminary result)   Collection Time: 03/05/21  1:06 PM   Specimen: BLOOD LEFT HAND  Result Value Ref Range Status   Specimen Description BLOOD LEFT HAND  Final   Special Requests   Final    BOTTLES  DRAWN AEROBIC AND ANAEROBIC Blood Culture adequate volume   Culture   Final    NO GROWTH < 24 HOURS Performed at [facility], [address], [place], [place] [address]    Report Status PENDING  Incomplete  Culture, blood (Routine X 2) w Reflex to ID Panel     Status: None (Preliminary result)   Collection Time: 03/05/21  1:14 PM   Specimen: BLOOD RIGHT HAND  Result Value Ref Range Status   Specimen Description BLOOD RIGHT HAND  Final   Special Requests   Final    BOTTLES DRAWN AEROBIC AND ANAEROBIC Blood Culture adequate volume   Culture   Final    NO GROWTH < 24 HOURS Performed at [facility], [address], [place], [place] [address]    Report Status PENDING  Incomplete  CSF culture w Gram Stain     Status: None (Preliminary result)   Collection Time: 03/06/21 10:23 AM   Specimen: PATH Cytology CSF; Cerebrospinal Fluid  Result Value Ref Range Status   Specimen Description CSF  Final   Special Requests NONE  Final   Gram Stain   Final    WBC PRESENT, PREDOMINANTLY MONONUCLEAR NO ORGANISMS SEEN CYTOSPIN  SMEAR Performed at [facility], [address], [place], [place] [address]    Culture PENDING  Incomplete   Report Status PENDING  Incomplete      Studies: DG CHEST PORT 1 VIEW  Result Date: 03/05/2021 CLINICAL DATA:  56 year old female with history of aspiration pneumonia. EXAM: PORTABLE CHEST - 1 VIEW COMPARISON:  02/13/2021 FINDINGS: The mediastinal contours are within normal limits. No cardiomegaly. The lungs are clear bilaterally without evidence of focal consolidation, pleural effusion, or pneumothorax. No acute osseous abnormality. IMPRESSION: No acute cardiopulmonary process. Electronically Signed   By: Marliss Coots MD   On: 03/05/2021 11:22   DG FL GUIDED LUMBAR PUNCTURE  Result Date: 03/06/2021 CLINICAL DATA:  55 year old female with altered mental status, suspected normal pressure hydrocephalus. Repeat large volume CSF tap requested. EXAM: DIAGNOSTIC LUMBAR PUNCTURE UNDER FLUOROSCOPIC GUIDANCE COMPARISON:  02/07/2021 and earlier. FLUOROSCOPY TIME:  Fluoroscopy Time:  0 minutes 6 seconds Radiation Exposure Index (if provided by the fluoroscopic device): Not applicable Number of Acquired Spot Images: 0 PROCEDURE: Informed consent was obtained from the patient's mother over the phone prior to the procedure. A "time-out" was performed. With the patient prone, the lower back was prepped with Betadine. 1% Lidocaine was used for local anesthesia. Lumbar puncture was performed at the L1-L2 level using a right sub laminar technique, a 3.5 in x 20 gauge needle with return of clear, colorless CSF. Direct opening pressure measurement was not obtained, but subjectively the CSF pressure today was normal compared to that on the 02/07/2021 puncture. Thirty-one mL of CSF were obtained for laboratory studies. At that point, the CSF flow had slowed considerably. The needle was withdrawn, pressure held and hemostasis noted. The puncture site was cleaned and dressed with a Band-Aid. The patient  tolerated the procedure well and there were no apparent complications. Appropriate post procedural orders were placed on the chart. The patient was returned to the inpatient floor in stable condition for continued treatment. IMPRESSION: Fluoroscopic guided lumbar puncture at the L1-L2 level with 31 mL of CSF collected. Electronically Signed   By: Odessa Fleming M.D.   On: 03/06/2021 10:34    Scheduled Meds:  enoxaparin (LOVENOX) injection  40 mg Subcutaneous Q24H   insulin aspart  0-20  Units Subcutaneous Q4H   insulin glargine  5 Units Subcutaneous QHS   magic mouthwash w/lidocaine  10 mL Oral QID   Continuous Infusions:   ceFAZolin (ANCEF) IV 1 g (03/07/21 0547)   dextrose 5 % and 0.9 % NaCl with KCl 20 mEq/L 75 mL/hr at 03/07/21 0101   levETIRAcetam 500 mg (03/07/21 0028)     Principal Problem:   Bipolar 1 disorder, depressed, full remission (HCC) Active Problems:   Bipolar 1 disorder (HCC)   Hypertension   Generalized weakness   Controlled type 2 diabetes mellitus with hyperglycemia, with long-term current use of insulin (HCC)   Hyperlipidemia   NPH (normal pressure hydrocephalus) (HCC)   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   Consultants: Psychiatry Neurology Neurosurgery  Procedures: None  Antibiotics: Anti-infectives (From admission, onward)    Start     Dose/Rate Route Frequency Ordered Stop   03/06/21 0900  ceFAZolin (ANCEF) IVPB 1 g/50 mL premix        1 g 100 mL/hr over 30 Minutes Intravenous Every 8 hours 03/06/21 0801     03/04/21 1430  cefTRIAXone (ROCEPHIN) 1 g in sodium chloride 0.9 % 100 mL IVPB  Status:  Discontinued        1 g 200 mL/hr over 30 Minutes Intravenous Every 24 hours 03/04/21 1335 03/06/21 0800   02/14/21 0828  vancomycin (VANCOREADY) IVPB 1000 mg/200 mL  Status:  Discontinued        1,000 mg 200 mL/hr over 60 Minutes Intravenous 60 min pre-op 02/14/21 0828 03/02/21 1139   02/13/21 2115  cefTRIAXone (ROCEPHIN) 1 g in sodium chloride 0.9 % 100 mL IVPB   Status:  Discontinued        1 g 200 mL/hr over 30 Minutes Intravenous Every 24 hours 02/13/21 2016 02/16/21 0844        Time spent: 25 minutes    Junious SilkAllison Rakiya Krawczyk ANP  [facility] 7 am - 330 pm/M-F for direct patient care and secure chat Please refer to Amion for contact info 54  days

## 2021-03-07 NOTE — Progress Notes (Signed)
SLP Cancellation Note  Patient Details Name: Carolyn Norton MRN: [id number] DOB: [birth year]   Cancelled treatment:       Reason Eval/Treat Not Completed: Other (comment) (Pt is currently NPO. Pt's RN is currently assessing the reason for this. SLP will therefore follow up on a subsequent date for treatment.)  Carolyn Norton I. Vear Clock, MS, CCC-SLP Acute Rehabilitation Services Office number [phone] Pager [phone]  Carolyn Norton 03/07/2021, 4:34 PM

## 2021-03-07 NOTE — Progress Notes (Signed)
Neurology Progress Note  Brief HPI: 55 y.o. female with PMHx Bipolar I disorder, anxiety, depression, HTN, HLD, COPD, CKD3b, uncontrolled DM II, and cocaine abuse who presented to the hospital initially in early May 2022 for altered mental status, gait abnormality with falls at home and non-adherence to medications and she was found to be severely dehydrated with hyperglycemia. Her hospitalization has been complicated by a progressive cognitive and functional decline with improvement in cognition and functional status following large volume CSF drainage via fluoro-guided lumbar puncture. She was scheduled to be transitioned to a SNF but neurology was contacted 03/04/2021 for further evaluation of progressive cognitive and functional decline since last CSF drainage to determine if further intervention is necessary prior to discharge.  Interval History: Large volume LP complete yesterday 03/06/2021- 75mL CSF collected via fluoroscopic guided lumbar puncture in IR Unable to review PT note following LP  Subjective: No acute overnight events   Exam: Vitals:   03/06/21 0358 03/06/21 1332  BP: (!) 162/91 (!) 149/79  Pulse: 79 79  Resp:  16  Temp: 98.8 F (37.1 C) 98.1 F (36.7 C)  SpO2: 99% 100%   Gen: Laying in bed sleeping, in no acute distress Resp: non-labored breathing, no respiratory distress Abd: soft, non-tender, non-distended  Neuro: Mental Status: Asleep initially, wakes easily to voice but remains lethargic requiring repeated stimulation for examination participation. She is pleasantly confused with poor attention. She is able to state her full name and that she is in the hospital. She states incorrectly that the year is 46 and that she is 55 years old. She does not answer when asked multiple times the current month, season, or events leading to hospitalization. Speech is intact without aphasia or dysarthria.  She does not try and repeat phrases with multiple examiner attempts.  She  only identifies the first object when asked by examiner and does not participate further.  She intermittently follows simple commands with repeat instruction. No neglect is noted.  Cranial Nerves: PERRL 4 mm / brisk, EOMI without ptosis, attends to stimuli in all visual fields, face is symmetric resting and smiling, hearing is intact to voice, palate elevates symmetrically, tongue protrudes midline without fasciculations.  Motor: Right upper extremity with spontaneous antigravity movement without vertical drift. She does not participate in strength assessment in the left upper extremity. She is able to assist with some antigravity movement of the left upper extremity but does not attempt to resist gravity once elevated.  She does not attempt to squeeze examiner's fingers on command for grip strength assessment.  She does not attempt antigravity movement of bilateral lower extremities. Intermittently and minimally wiggles toes to repeated command. Right upper extremity with increased tone due to impaired relaxation.  Sensory: Brisk grimace and yell with light application of noxious stimuli throughout without asymmetry. Does not communicate sensation to light touch throughout.  Gait: Deferred  Pertinent Labs: CBC    Component Value Date/Time   WBC 8.4 03/04/2021 0844   RBC 4.34 03/04/2021 0844   HGB 12.6 03/04/2021 0844   HCT 38.1 03/04/2021 0844   PLT 210 03/04/2021 0844   MCV 87.8 03/04/2021 0844   MCH 29.0 03/04/2021 0844   MCHC 33.1 03/04/2021 0844   RDW 15.1 03/04/2021 0844   LYMPHSABS 2.8 03/04/2021 0844   MONOABS 0.9 03/04/2021 0844   EOSABS 0.0 03/04/2021 0844   BASOSABS 0.1 03/04/2021 0844   CMP     Component Value Date/Time   NA 140 03/06/2021 0412   K 3.8 03/06/2021 0412  CL 104 03/06/2021 0412   CO2 30 03/06/2021 0412   GLUCOSE 167 (H) 03/06/2021 0412   BUN 7 03/06/2021 0412   CREATININE 0.86 03/06/2021 0412   CALCIUM 9.3 03/06/2021 0412   PROT 6.1 (L) 03/05/2021  0856   ALBUMIN 2.7 (L) 03/05/2021 0856   AST 14 (L) 03/05/2021 0856   ALT 11 03/05/2021 0856   ALKPHOS 42 03/05/2021 0856   BILITOT 0.4 03/05/2021 0856   GFRNONAA >60 03/06/2021 0412   GFRAA >60 12/12/2017 0408   Urinalysis    Component Value Date/Time   COLORURINE YELLOW 03/03/2021 0810   APPEARANCEUR CLEAR 03/03/2021 0810   LABSPEC 1.022 03/03/2021 0810   PHURINE 5.0 03/03/2021 0810   GLUCOSEU >=500 (A) 03/03/2021 0810   HGBUR NEGATIVE 03/03/2021 0810   BILIRUBINUR NEGATIVE 03/03/2021 0810   KETONESUR 80 (A) 03/03/2021 0810   PROTEINUR NEGATIVE 03/03/2021 0810   UROBILINOGEN 0.2 07/20/2014 2022   NITRITE NEGATIVE 03/03/2021 0810   LEUKOCYTESUR SMALL (A) 03/03/2021 0810   Lab Results  Component Value Date   VITAMINB12 580 02/27/2021   Lab Results  Component Value Date   TSH 1.750 01/25/2021   Thiamine 02/27/2021: 259.2 Ammonia 01/24/2021: 28  Imaging Reviewed: No new Imaging.  Assessment: 55 yo female with PMHx polysubstance abuse with prolonged hospitalization. Since, prior Neurology consult and progress notes, patient has continued to decline in functionality and cognition. Cognition has waxed and waned with toxic / metabolic / infectious derangements throughout hospitalization.  - Examination reveals ongoing confusion, poor attention, and varying degrees of participation in examinations. 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 - Multifactorial encephalopathy  Recommendation Large-volume tap has really not led to significant improvements in  her. I am not really sure if the NPH alone can explain her mentation. I feel that her current presentation is a mix of multifactorial toxic metabolic encephalopathy in a patient with poor baseline brain reserve and her leg weakness is emanating from critical illness myopathy and neuropathy. At this point, she needs rehabilitation, which due to insurance and other reasons has not been happening but that is I think the only thing that would help her get her on the path of recovery. She definitely needs outpatient neurosurgical evaluation for consideration of VP shunt but that will not be very meaningful till she has some improvement in her ambulation and mentation which will require some rehabilitative plan. That said, since we do not have a very clear explanation of the lower extremity weakness, an MRI of the neuraxis-C-spine and T-spine along with L-spine-should be done to rule out spinal cord pathology such as myelopathy or compressive pathology such as spinal cord compression and radiculopathy.  The timeline of this is not hyperacute for any acute intervention but more so to get an answer for her weakness that might help guide treatment.  Lanae Boast, AGACNP-BC [facility] [phone]   Attending Neurohospitalist Addendum Patient seen and examined with APP/Resident. Agree with the history and physical as documented above. Agree with the plan as documented, which I helped formulate. I have independently reviewed the chart, obtained history, review of systems and examined the patient.I have personally reviewed pertinent head/neck/spine imaging (  CT/MRI). Discussed with Dr. Randol Kern. Please feel free to call with any questions.  -- Milon Dikes, MD Neurologist [facility] Pager: [phone]

## 2021-03-07 NOTE — Progress Notes (Signed)
Physical Therapy Treatment Patient Details Name: Carolyn Norton MRN: [id number] DOB: [birth year] Today's Date: 03/07/2021    History of Present Illness Pt is a 55 y.o. female admitted 01/11/21 with multiple falls, AMS, weakness. Workup for severe dehydration, DKA, metabolic encephalopathy. 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 Neurosurgery following for possible lumbar drain trial vs repeat LP vs VP shunt placement. Placement of lumbar drain on 6/10 cancelled due to UTI. PMH includes DM2, COPD, CKD3, GERD, bipolar disorder, COVID-19, polysubstance abuse including cocaine.  Had repeat LP on 6/30 and post NPH eval done.    PT Comments    Patient more alert and able to recall my name after delay since it is same as her mother's.  She demonstrated too much retropulsion on EOB today to attempt standing at [facility].  She also demonstrates increased tone in R LE as compared to L and more heel cord contracture despite wearing PRAFO's at night.  She continues to wax and wane.  Feel she will need SNF level rehab at d/c.  Follow Up Recommendations  SNF     Equipment Recommendations  Rolling walker with 5" wheels;3in1 (PT);Hospital bed;Wheelchair (measurements PT);Other (comment)    Recommendations for Other Services       Precautions / Restrictions Precautions Precautions: Fall    Mobility  Bed Mobility Overal bed mobility: Needs Assistance Bed Mobility: Rolling;Sidelying to Sit Rolling: Max assist Sidelying to sit: Total assist       General bed mobility comments: assist to roll and maintain as pt moaning and when asked confirmed back pain, place her hand on my hip for side to sit with improvement in coming upright as opposed to rail    Transfers Overall transfer level: Needs assistance   Transfers: Squat Pivot Transfers     Squat pivot transfers: Max assist;+2 physical assistance     General transfer comment: assist to drop arm recliner in step-wise  fashion to move hips further over in chair.  Ambulation/Gait                 Stairs             Wheelchair Mobility    Modified Rankin (Stroke Patients Only)       Balance Overall balance assessment: Needs assistance Sitting-balance support: Feet supported Sitting balance-Leahy Scale: Zero Sitting balance - Comments: pushes back, but improved with stretching into flexion on EOB, performed several bouts of stretching forward, and diagonal forward to R and L with assist.     Standing balance-Leahy Scale: Zero                              Cognition Arousal/Alertness: Awake/alert Behavior During Therapy: Flat affect Overall Cognitive Status: Impaired/Different from baseline Area of Impairment: Attention;Memory;Following commands;Safety/judgement;Awareness;Problem solving                   Current Attention Level: Focused Memory: Decreased short-term memory Following Commands: Follows one step commands with increased time;Follows one step commands inconsistently Safety/Judgement: Decreased awareness of deficits   Problem Solving: Slow processing;Decreased initiation;Difficulty sequencing;Requires verbal cues;Requires tactile cues General Comments: readily answered what her mother's name is, I told her my name was the same, later in session when asked, she remembered my name      Exercises      General Comments General comments (skin integrity, edema, etc.): VSS on RA; replaced pure-wick once in chair  Pertinent Vitals/Pain Pain Assessment: Faces Faces Pain Scale: Hurts little more Pain Location: generalized with bed mobility Pain Descriptors / Indicators: Discomfort;Grimacing;Moaning Pain Intervention(s): Monitored during session;Repositioned    Home Living                      Prior Function            PT Goals (current goals can now be found in the care plan section) Progress towards PT goals: Not progressing  toward goals - comment    Frequency    Min 2X/week      PT Plan Current plan remains appropriate    Co-evaluation              AM-PAC PT "6 Clicks" Mobility   Outcome Measure  Help needed turning from your back to your side while in a flat bed without using bedrails?: Total Help needed moving from lying on your back to sitting on the side of a flat bed without using bedrails?: Total Help needed moving to and from a bed to a chair (including a wheelchair)?: Total Help needed standing up from a chair using your arms (e.g., wheelchair or bedside chair)?: Total Help needed to walk in hospital room?: Total Help needed climbing 3-5 steps with a railing? : Total 6 Click Score: 6    End of Session Equipment Utilized During Treatment: Gait belt Activity Tolerance: Patient tolerated treatment well Patient left: in chair;with call bell/phone within reach;with chair alarm set   PT Visit Diagnosis: Other abnormalities of gait and mobility (R26.89);Other symptoms and signs involving the nervous system (R29.898);Muscle weakness (generalized) (M62.81)     Time: 7425-9563 PT Time Calculation (min) (ACUTE ONLY): 27 min  Charges:  $Therapeutic Activity: 23-37 mins                     Sheran Lawless, PT Acute Rehabilitation Services [phone] Office:[phone] 03/07/2021    Carolyn Norton 03/07/2021, 1:23 PM

## 2021-03-07 NOTE — Progress Notes (Addendum)
Palliative:  HPI: 55 year old female past medical history of diabetes mellitus type 2, bipolar disorder type I, previous COVID infection, polysubstance abuse that includes cocaine.  With progressive cognitive decline/ambulatory dysfunction secondary to cerebellar atrophy and NPH. Poor progress and overall prognosis poor.   I met today at [facility] bedside but no family/visitors present. [name] smiles and greets me. She tracks and looks at me and answers to her name. She is unable to answer any other orientation questions and ultimately doesn't respond at all but back to sleep. She was able to tell me she is "okay." RN working to restore diet order - I am guessing she was NPO for LP and that she should be able to resume previous diet. Per report she has had poor intake of solids but drinking fluids well.   I called and spoke with mother, Carolyn Norton. I discussed with Carolyn Norton my assessment of Carolyn Norton. Carolyn Norton shares that she visited last night and Carolyn Norton did recognize herself and her husband (Carolyn Norton's father) but then feel asleep. I discuss further with Carolyn Norton that I was asked to follow up on potential plans for focus on comfort care. Carolyn Norton does not seem to recall these conversations. I explain that comfort care would mean allowing Carolyn Norton to eat and drink what she wishes, make sure she is comfortable and without pain/suffering, and not sticking for blood or continuing IV fluids. Carolyn Norton agrees and then asks if Carolyn Norton is at end of life. I explained that shifting to comfort would be stopping the interventions that are prolonging her life if that is what they desire for [name]. Carolyn Norton is not sure and asks me to call her husband, Shelly Flatten.   I called and spoke with [place]. Shelly Flatten shares that he has not had communication from medical team and is not aware of what to expect or what is going on medically with his daughter. He is aware of LP yesterday. Shelly Flatten notes that himself and his sister [name]) are Celestina's HCPOAs. He expresses frustration and feeling that certain team members just want Satin out of the hospital "since she doesn't have insurance."  I explained that I am not aware of any of this. I expressed that I am just meeting [name] today for the first time. I expressed to him that my only concern is for his daughter's well being and ensuring that the care we provide to her is most appropriate and what she would want. We discussed concern that the interventions we can and are providing is prolonging Carolyn Norton's life but not expected to improve her quality of life. Shelly Flatten mentions feeding tube and I explained that the feeding tube would be the same expectation that it may prolong life but will not be the answer to make Carolyn Norton better. I discussed that her brain and mentation is her main barrier to improvement and even ability to participate in therapy to be able to improve. I encouraged Shelly Flatten that these are the hard questions we need to answer - 1) Is this a quality of life that Carolyn Norton would be happy with? 2) If not then are we helping her or hurting her by continuing interventions to prolong her life in this state? Shelly Flatten says that he understands. I encourage him to speak with his family more about these decisions and I will have my colleague reach out to him tomorrow to see what they think.   UPDATE: I received call from bedside RN who says that aunt [name]) was at bedside and requesting liberalized visitation if  Carolyn Norton is at end of life. I relayed to RN that I spoke with [name]/father regarding goals of care, expectations, and options for potential comfort focused care given poor progression and outcomes but no decisions made at this stage. RN to encourage her to speak with [facility].   All questions/concerns addressed to best of my ability. Emotional support provided.   Exam: Alert, oriented to self. Confused. Lethargic. Not following commands. No distress. Breathing  regular, unlabored. Abd soft.   Plan: - Ongoing goals of care conversations. No decisions made. Goals are not clear at this time. Palliative to continue follow up discussions.   [place], NP Palliative Medicine Team Pager [phone] (Please see [email or web address] for schedule) Team Phone [phone]    Greater than 50%  of this time was spent counseling and coordinating care related to the above assessment and plan

## 2021-03-08 ENCOUNTER — Inpatient Hospital Stay (HOSPITAL_COMMUNITY): Payer: Medicaid Other

## 2021-03-08 DIAGNOSIS — N390 Urinary tract infection, site not specified: Secondary | ICD-10-CM

## 2021-03-08 LAB — GLUCOSE, CAPILLARY
Glucose-Capillary: 134 mg/dL — ABNORMAL HIGH (ref 70–99)
Glucose-Capillary: 241 mg/dL — ABNORMAL HIGH (ref 70–99)
Glucose-Capillary: 213 mg/dL — ABNORMAL HIGH (ref 70–99)
Glucose-Capillary: 290 mg/dL — ABNORMAL HIGH (ref 70–99)

## 2021-03-08 MED ORDER — GADOBUTROL 1 MMOL/ML IV SOLN
10.0000 mL | Freq: Once | INTRAVENOUS | Status: AC | PRN
  Administered 2021-03-08: 10 mL via INTRAVENOUS

## 2021-03-08 MED ORDER — ENSURE ENLIVE PO LIQD
237.0000 mL | Freq: Two times a day (BID) | ORAL | Status: DC
  Administered 2021-03-08 – 2021-03-12 (×4): 237 mL via ORAL
  Filled 2021-03-08 (×3): qty 237

## 2021-03-08 NOTE — Plan of Care (Signed)
MRI of the brain, cervical, thoracic and lumbar spine reviewed. MR brain and other brain imaging including response to healthy is concerning for NPH for which she has been evaluated by neurosurgery as well. MRI of the spine not concerning for any sort of acute compressive pathology. Gait disturbance deconditioning versus critical illness myopathy and neuropathy. No new recommendations at this time. Continue rehabilitative efforts and outpatient neurology and neurosurgery follow-up. D/w Dr. Randol Kern   -- Milon Dikes, MD Neurologist [facility] Pager: [phone]

## 2021-03-08 NOTE — Progress Notes (Signed)
PROGRESS NOTE    Carolyn Norton  NWG:[id number] DOB: 07/18/1966 DOA: 01/11/2021 PCP: Carolyn Lodge, FNP    Chief Complaint  Patient presents with   Generalized Weakness   Multiple Falls   Medication Noncompliance   Altered Mental Status   Hyperglycemia    Brief Narrative:  COVID infection, polysubstance abuse that includes cocaine.  She presented from home where she lives with her parents.  Her presenting symptoms included generalized weakness, multiple falls and confusion.  Family confirmed that she had been having the symptoms for at least a month with last use of crack cocaine 1 month prior to presentation.  Ever since that time she had not been doing well.  She would also be having issues with hyperglycemia.  In the ER she was diagnosed with severe dehydration, hyperglycemia and metabolic encephalopathy.   Since admission patient has been evaluated by the psychiatric team who has deemed her alert and oriented and having capacity to make decisions.  Patient is agreeable to placement at a rehab.  Currently she has severe physical deconditioning which was POA and PT is recommending SNF.  Because of her history of polysubstance abuse and lack of funding SNF is not an option.  Family is agreeable to taking her back in home when she is able to ambulate well enough to get back and forth for meals into the bathroom.  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 -Neurology input greatly appreciated, felt initially concern for NPH contributing mainly to her symptoms as she had improvement after large-volume LP, she had recent volume NP with minimal improvement of mentation. -Recent episode of encephalopathy felt more likely in the setting of dehydration and infectious etiology due to UTI as it did improve with IV fluids and antibiotics. -Significant leg weakness, MRI cervical/thoracic/lumbar spine with no acute findings, neurology input appreciated, it felt emanating from critical illness immunotherapy and neuropathy. -New with PT/OT    Profound physical deconditioning Likely a combination of persistent altered mental status and inability to participate consistently with PT as well as myopathy of critical illness   Ventriculomegaly/? NPH Did improve after large-volume LP several weeks ago but now with progressive mental status changes-repeat large-volume LP 6/30 w/o any significant changes so unlikely NPH etiology to cognitive impairment Neurosurgery reconsulted on 6/29   Acute hypoxemia/COPD Patient with subtle increased work of breathing and tachypnea ABG with PaO2 of 65.9 with baseline in December 2021 of 89 Mild elevation in bicarbonate with normal pH and normal CO2 Chest x-ray unremarkable Suspect hypoxemia secondary to altered mental status with subsequent hyperventilation Start 2 L nasal cannula oxygen and monitor pulse oximetry continuously.   Dehydration with acute kidney injury Peak creatinine 1.38 on 6/27.  This in context of hyperglycemia 513 Creatinine has decreased to 0.86 therefore will decrease IV fluids to 75 cc/h   Anorexia/Odynophagia Currently n.p.o. secondary to altered mental status and awaiting reevaluation by speech therapy Once oral  diet resume please reorder Megace  E. Coli  [name] UTI Urine cx + 60,000 colonies E. Coli- Treated initially with empiric Rocephin but sensitive to Ancef so has been changed Suspect purewick contributing to recurrent UTI so order placed to apply only at HS Blood culture remains negative to date   Diabetes mellitus 2 with hyperglycemia on insulin Hemoglobin A1c 11.0 Change Lantus to 5 units HS Continue every 4 hours SSI but increased to resistant scale Metformin discontinued 6/28    Dyslipidemia Statin on hold while n.p.o.   History of bipolar 1 disorder/polysubstance abuse/cognitive impairment Continue to hold escitalopram, Neurontin and Depakote no n.p.o. Functional cognitive evaluation with significant abnormalities   GERD Oral PPI on hold while n.p.o.   Hypokalemia/hypomagnesemia Monitor and replete as needed  DVT prophylaxis: Lovenox Code Status: DNR Family Communication: None at bedside Disposition:   Status is: Inpatient  Remains inpatient appropriate because:IV treatments appropriate due to intensity of illness or inability to take PO  Dispo:  Patient From:    Planned Disposition: To be determined           Consultants:  Psychiatry Neurosurgery Palliative Neurology    Subjective:  No significant events overnight as discussed with staff, she is with poor appetite this morning.  Objective: Vitals:   03/07/21 0634 03/07/21 1320 03/07/21 2139 03/08/21 1100  BP: (!) 157/77 (!) 147/77 136/72   Pulse: 79 82 81 77  Resp: 16  16   Temp: 98.7 F (37.1 C) 99.2 F (37.3 C) 99.1 F (37.3 C)   TempSrc:   Oral   SpO2: 100% 99% 99% 98%  Weight:      Height:        Intake/Output Summary (Last 24 hours) at 03/08/2021 1218 Last data filed at 03/08/2021 1100 Gross per 24 hour  Intake 1803.48 ml  Output 500 ml  Net 1303.48 ml   Filed Weights   01/11/21 1152  Weight: 99.8 kg    Examination:  Awake Alert, Oriented X 1, extremely frail, deconditioned, with impaired cognition and  insight Symmetrical Chest wall movement, Good air movement bilaterally, CTAB RRR,No Gallops,Rubs or new Murmurs, No Parasternal Heave +ve B.Sounds, Abd Soft, No tenderness, No rebound - guarding or rigidity. No Cyanosis, Clubbing or edema, No new Rash or bruise       Data Reviewed: I have personally reviewed following labs and imaging studies  CBC: Recent Labs  Lab 03/03/21 0818 03/04/21 0844  WBC 11.1* 8.4  NEUTROABS 7.1 4.6  HGB 13.8 12.6  HCT 40.1 38.1  MCV 87.4 87.8  PLT 207 210    Basic Metabolic Panel: Recent Labs  Lab 03/03/21 0818 03/04/21 0844 03/05/21 0856 03/06/21 0412  NA 131* 134* 136 140  K 5.0 3.7 4.0 3.8  CL 92* 94* 99 104  CO2 21* 27 28 30   GLUCOSE 513* 348* 299* 167*  BUN 20 19 14 7   CREATININE 1.38* 1.17* 1.01* 0.86  CALCIUM 9.1 9.1 9.2 9.3    GFR: Estimated Creatinine Clearance: 90.2 mL/min (by C-G formula based on SCr of 0.86 mg/dL).  Liver Function Tests: Recent Labs  Lab 03/03/21 0818 03/05/21 0856  AST 14* 14*  ALT 14 11  ALKPHOS 48 42  BILITOT 1.1 0.4  PROT 6.5 6.1*  ALBUMIN 3.0* 2.7*    CBG: Recent Labs  Lab 03/07/21 1207 03/07/21 1600 03/07/21 2137 03/08/21 0745 03/08/21 1136  GLUCAP 215* 300* 287* 134* 241*     Recent Results (from the past 240 hour(s))  Culture,  Urine     Status: Abnormal   Collection Time: 03/03/21  8:15 AM   Specimen: Urine, Catheterized  Result Value Ref Range Status   Specimen Description URINE, CATHETERIZED  Final   Special Requests   Final    NONE Performed at [facility], [address] [place], [place] [address]    Culture (A)  Final    60,000 COLONIES/mL ESCHERICHIA COLI 10,000 COLONIES/mL KLEBSIELLA PNEUMONIAE    Report Status 03/05/2021 FINAL  Final   Organism ID, Bacteria ESCHERICHIA COLI (A)  Final   Organism ID, Bacteria KLEBSIELLA PNEUMONIAE (A)  Final      Susceptibility   Escherichia coli - MIC*    AMPICILLIN >=32 RESISTANT Resistant     CEFAZOLIN <=4  SENSITIVE Sensitive     CEFEPIME <=0.12 SENSITIVE Sensitive     CEFTRIAXONE <=0.25 SENSITIVE Sensitive     CIPROFLOXACIN >=4 RESISTANT Resistant     GENTAMICIN >=16 RESISTANT Resistant     IMIPENEM <=0.25 SENSITIVE Sensitive     NITROFURANTOIN <=16 SENSITIVE Sensitive     TRIMETH/SULFA <=20 SENSITIVE Sensitive     AMPICILLIN/SULBACTAM 16 INTERMEDIATE Intermediate     PIP/TAZO <=4 SENSITIVE Sensitive     * 60,000 COLONIES/mL ESCHERICHIA COLI   Klebsiella pneumoniae - MIC*    AMPICILLIN RESISTANT Resistant     CEFAZOLIN <=4 SENSITIVE Sensitive     CEFEPIME <=0.12 SENSITIVE Sensitive     CEFTRIAXONE <=0.25 SENSITIVE Sensitive     CIPROFLOXACIN <=0.25 SENSITIVE Sensitive     GENTAMICIN <=1 SENSITIVE Sensitive     IMIPENEM <=0.25 SENSITIVE Sensitive     NITROFURANTOIN 64 INTERMEDIATE Intermediate     TRIMETH/SULFA <=20 SENSITIVE Sensitive     AMPICILLIN/SULBACTAM <=2 SENSITIVE Sensitive     PIP/TAZO <=4 SENSITIVE Sensitive     * 10,000 COLONIES/mL KLEBSIELLA PNEUMONIAE  Culture, blood (Routine X 2) w Reflex to ID Panel     Status: None (Preliminary result)   Collection Time: 03/05/21  1:06 PM   Specimen: BLOOD LEFT HAND  Result Value Ref Range Status   Specimen Description BLOOD LEFT HAND  Final   Special Requests   Final    BOTTLES DRAWN AEROBIC AND ANAEROBIC Blood Culture adequate volume   Culture   Final    NO GROWTH 3 DAYS Performed at [facility], [address], [place], [place] [address]    Report Status PENDING  Incomplete  Culture, blood (Routine X 2) w Reflex to ID Panel     Status: None (Preliminary result)   Collection Time: 03/05/21  1:14 PM   Specimen: BLOOD RIGHT HAND  Result Value Ref Range Status   Specimen Description BLOOD RIGHT HAND  Final   Special Requests   Final    BOTTLES DRAWN AEROBIC AND ANAEROBIC Blood Culture adequate volume   Culture   Final    NO GROWTH 3 DAYS Performed at [facility], [address] [place], [place] [address]     Report Status PENDING  Incomplete  CSF culture w Gram Stain     Status: None (Preliminary result)   Collection Time: 03/06/21 10:23 AM   Specimen: PATH Cytology CSF; Cerebrospinal Fluid  Result Value Ref Range Status   Specimen Description CSF  Final   Special Requests NONE  Final   Gram Stain   Final    WBC PRESENT, PREDOMINANTLY MONONUCLEAR NO ORGANISMS SEEN CYTOSPIN SMEAR    Culture   Final    NO GROWTH 2 DAYS  Performed at [facility], [address] [place], [place] [address]    Report Status PENDING  Incomplete         Radiology Studies: MR CERVICAL SPINE W WO CONTRAST  Result Date: 03/08/2021 CLINICAL DATA:  55 year old female who has been admitted with altered mental status, possible NPH including gait abnormality. Unexplained cognitive and functional decline. Status post two high-volume lumbar punctures, most recently 03/06/2021 - reportedly without the clinical improvement observed after the 1st high-volume LP earlier that month. Unexplained leg weakness. EXAM: MRI CERVICAL SPINE WITHOUT AND WITH CONTRAST TECHNIQUE: Multiplanar and multiecho pulse sequences of the cervical spine, to include the craniocervical junction and cervicothoracic junction, were obtained without and with intravenous contrast. CONTRAST:  10mL GADAVIST GADOBUTROL 1 MMOL/ML IV SOLN COMPARISON:  CT cervical spine 10/21/2016.  Brain MRI 02/02/2021. FINDINGS: Alignment: Chronic straightening of cervical lordosis with mild focal lordosis at C5-C6, increased since 2018. Vertebrae: Endplate marrow edema and enhancement at C5-C6, although no associated paraspinal soft tissue inflammation. Abnormal signal in the disc space appears primarily related to disc space loss. No other No marrow edema or evidence of acute osseous abnormality. Background bone marrow signal is normal. Cord: No definite cervical cord signal abnormality, despite degenerative mass effect at C5-C6 detailed below. Above and below that level  cervical cord signal and morphology appear normal. No abnormal intradural enhancement. No dural thickening. Posterior Fossa, vertebral arteries, paraspinal tissues: Cervicomedullary junction is within normal limits. Stable visible posterior fossa. Preserved major vascular flow voids in the neck with a dominant right vertebral artery. Negative visible neck soft tissues and lung apices. Disc levels: C2-C3: Mild left and moderate to severe right facet hypertrophy. No spinal stenosis but severe right C3 foraminal stenosis. C3-C4: Mild disc bulge. Mild endplate spurring. Mild left and moderate to severe right facet hypertrophy. Mild left and moderate to severe right C4 foraminal stenosis. C4-C5: Minor disc bulge and endplate spurring. Mild to moderate facet hypertrophy greater on the right. Mild left and moderate to severe right C5 foraminal stenosis. C5-C6: Disc space loss. Circumferential disc osteophyte complex with a broad-based posterior component, mild left paracentral and caudal extension of disc. Superimposed mild facet and ligament flavum hypertrophy. Spinal stenosis with mild spinal cord mass effect. Severe bilateral C6 foraminal stenosis. C6-C7: Disc space loss. Circumferential disc osteophyte complex with broad-based posterior component. Mild spinal stenosis. No convincing cord mass effect. Moderate to severe C7 foraminal stenosis greater on the left. C7-T1: Circumferential disc osteophyte complex most affecting the foramina. Mild to moderate facet and ligament flavum hypertrophy. No significant spinal stenosis. Moderate to severe bilateral C8 foraminal stenosis. Thoracic levels are reported separately today. IMPRESSION: 1. Advanced for age cervical spine degeneration. 2. Spinal stenosis with mild cord mass effect at C5-C6 where abnormal signal at the disc and endplates appear to be degenerative rather than infectious or inflammatory. But no abnormal cord signal identified. Mild degenerative spinal stenosis  at C6-C7 without cord mass effect. 3. Widespread bilateral moderate to severe degenerative cervical foraminal stenosis, including involvement of all right side cervical foramina. Electronically Signed   By: Odessa Fleming M.D.   On: 03/08/2021 07:14   MR THORACIC SPINE W WO CONTRAST  Result Date: 03/08/2021 CLINICAL DATA:  55 year old female who has been admitted with altered mental status, possible NPH including gait abnormality. Unexplained cognitive and functional decline. Status post two high-volume lumbar punctures, most recently 03/06/2021 - reportedly without the clinical improvement observed after the 1st high-volume LP earlier that month. Unexplained leg  weakness. EXAM: MRI THORACIC WITHOUT AND WITH CONTRAST TECHNIQUE: Multiplanar and multiecho pulse sequences of the thoracic spine were obtained without and with intravenous contrast. CONTRAST:  10mL GADAVIST GADOBUTROL 1 MMOL/ML IV SOLN in conjunction with contrast enhanced imaging of the cervical spine reported separately. COMPARISON:  Cervical spine MRI today reported separately. CT Chest, Abdomen, and Pelvis 10/21/2016. FINDINGS: Limited cervical spine imaging:  Reported separately today. Thoracic spine segmentation:  Normal on the 2018 comparison. Alignment: Stable thoracic kyphosis since 2018, normal to mildly exaggerated. No spondylolisthesis. Vertebrae: No marrow edema or evidence of acute osseous abnormality. Subtle chronic T4 superior endplate compression fracture is stable since 2018. Chronic degenerative T6-T7 endplate marrow signal changes. Normal background bone marrow signal. Cord: Thoracic spinal cord appears to remain normal, despite degenerative changes detailed below. No cord myelomalacia or signal abnormality. No abnormal intradural enhancement. No dural thickening. Conus medullaris appears normal at T12. Paraspinal and other soft tissues: Negative. Disc levels: T1-T2: Foraminal disc osteophyte complex. Mild facet hypertrophy. No spinal  stenosis. Moderate bilateral T1 foraminal stenosis. T2-T3: Disc space loss. Broad-based posterior disc bulge or protrusion involving the neural foramina left greater than right. Effaced ventral CSF space but no spinal stenosis. Severe left and moderate right T2 foraminal stenosis. T3-T4: Mild endplate spurring.  Otherwise negative. T4-T5: Mild disc bulging.  No stenosis. T5-T6: Mild disc bulging and facet hypertrophy.  No stenosis. T6-T7: Chronic disc space loss, severe on the 2018 CT. Circumferential disc osteophyte complex eccentric to the right. Effaced ventral CSF space, but dorsal thecal sac remains patent without significant spinal stenosis. No foraminal stenosis. T7-T8: Small cephalad series 25, image 22 disc extrusion in the midline (and series 22, image 8. But no spinal or foraminal stenosis. T8-T9: Broad-based right paracentral and slightly cephalad disc protrusion (series 25, image 25. Effaced ventral CSF space but no spinal stenosis. No foraminal stenosis. T9-T10: Disc space loss. Circumferential disc osteophyte complex. Mild facet hypertrophy. Effaced ventral CSF space without spinal stenosis. Mild T9 foraminal stenosis. T10-T11: Mild disc bulge. Mild facet hypertrophy. No spinal stenosis. Mild T10 foraminal stenosis. T11-T12: Negative. T12-L1: Negative. IMPRESSION: 1. No acute osseous abnormality in the thoracic spine. Mild chronic T4 compression fracture is stable since 2018. 2. Thoracic spinal cord remains normal, with no significant spinal stenosis despite widespread thoracic spine degeneration. Chronic disc and endplate degeneration most pronounced at T6-T7. 3. Moderate to severe degenerative foraminal stenosis at T2-T3. Mild foraminal stenosis at T9-T10 and T10-T11. Electronically Signed   By: Odessa FlemingH  Hall M.D.   On: 03/08/2021 07:28   MR Lumbar Spine W Wo Contrast  Result Date: 03/08/2021 CLINICAL DATA:  55 year old female who has been admitted with altered mental status, possible NPH including  gait abnormality. Unexplained cognitive and functional decline. Status post two high-volume lumbar punctures, most recently 03/06/2021 - reportedly without the clinical improvement observed after the 1st high-volume LP earlier that month. Unexplained leg weakness. EXAM: MRI LUMBAR SPINE WITHOUT AND WITH CONTRAST TECHNIQUE: Multiplanar and multiecho pulse sequences of the lumbar spine were obtained without and with intravenous contrast. CONTRAST:  10mL GADAVIST GADOBUTROL 1 MMOL/ML IV SOLN in conjunction with contrast enhanced imaging of the cervical and thoracic spine reported separately. COMPARISON:  Lumbar puncture 03/06/2021 and earlier. CT Abdomen and Pelvis 10/21/2016. Thoracic spine MRI today. FINDINGS: Segmentation:  Normal, concordant with the thoracic numbering today. Alignment:  Normal lumbar lordosis. Vertebrae: No marrow edema or evidence of acute osseous abnormality. Mild S1 superior endplate Schmorl's node is chronic. Visualized bone marrow signal is within  normal limits. Intact visible sacrum and SI joints. Conus medullaris and cauda equina: Conus extends to the T12 level, and was better detailed on the thoracic MRI today. Cauda equina nerve roots appear normal aside from levels of stenosis detailed below. No nerve root thickening. No abnormal intradural enhancement. No dural thickening. Paraspinal and other soft tissues: Negative visible abdominal viscera. There is right greater than left patchy but confluent bilateral lumbar erector spinae STIR hyperintensity with faint enhancement. See series 2, image 5 on the right and image 14 on the left. This is primarily from the L3 level and caudally. No intramuscular fluid collection. No other paraspinal soft tissue inflammation, the epidural spaces and psoas muscles appear normal. Disc levels: T12-L1:  Negative. L1-L2:  Negative disc.  Mild facet hypertrophy.  No stenosis. L2-L3: Mild foraminal disc bulging. Mild congenital spinal canal narrowing begins  here due to short pedicle distance (series 4, image 13). Mild posterior element hypertrophy. No significant spinal stenosis. Mild L2 foraminal stenosis greater on the left. L3-L4: Congenital canal narrowing with circumferential disc bulge and mild to moderate facet and ligament flavum hypertrophy. Enhancement of the interspinous ligament appears to be degenerative in nature. Mild to moderate spinal stenosis (series 4, image 15). Mild bilateral L3 foraminal stenosis greater on the left. L4-L5: Circumferential disc bulge, moderate facet and ligament flavum hypertrophy superimposed on congenital canal narrowing. Similar interspinous ligament thickening and enhancement here. Moderate spinal stenosis (series 4, image 20). Mild L4 foraminal stenosis greater on the right. L5-S1: Congenital canal narrowing abates at this level. Negative disc. Mild facet hypertrophy. No stenosis. IMPRESSION: 1. Patchy inflammatory signal in the lumbar erector spinae muscles right greater than left. No intramuscular fluid collection and no other paraspinal soft tissue inflammation. This is compatible with nonspecific myositis and can be seen with chronically debilitated patients. 2. 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 Aside from stenosis the cauda equina nerve roots appear normal. Electronically Signed   By: Odessa Fleming M.D.   On: 03/08/2021 07:36        Scheduled Meds:  enoxaparin (LOVENOX) injection  40 mg Subcutaneous Q24H   feeding supplement  237 mL Oral BID BM   insulin aspart  0-20 Units Subcutaneous TID AC & HS   insulin glargine  5 Units Subcutaneous QHS   magic mouthwash w/lidocaine  10 mL Oral QID   Continuous Infusions:   ceFAZolin (ANCEF) IV 1 g (03/07/21 2056)   levETIRAcetam 500 mg (03/07/21 2229)     LOS: 55 days      Huey Bienenstock, MD [facility]   To contact the attending provider between 7A-7P or the covering provider during after hours  7P-7A, please log into the web site [email or web address] and access using universal [place] password for that web site. If you do not have the password, please call the hospital operator.  03/08/2021, 12:18 PM

## 2021-03-08 NOTE — Progress Notes (Signed)
Pt refusing to eat breakfast with RN. Nurse tech able to help patient take two bites of oatmeal and one sip of water.

## 2021-03-08 NOTE — Progress Notes (Addendum)
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 Still unsure if NPH primary issues or other underlying problem due to h/o drug abuse/bipolar. I plan to update her family later today. RN said they were here yesterday- if this is irreversible she shouldn't get a PEG and should be comfort in my opinion"   HPI:  Per intake H&P --> 55 year old female past medical history of diabetes mellitus type 2, bipolar disorder type I, previous COVID infection, polysubstance abuse that includes cocaine.  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   Carolyn Norton has been at [facility] for the past fifty-three days is not eating or drinking sufficiently.  Palliative care has been asked to get re-involved to further discuss goals of care in the setting of progressive failure to thrive and generalized weakness.  Today's Discussion (03/08/2021): Chart reviewed.  Spoke with patient's nurse, Florentina Addison. Carolyn Norton went down this morning for an MRI of her C/T/L spine this morning for unexplained cognitive and functional decline. Has been on treatment for a UTI with some improvements mentally and nutritionally.  Per conversations with Dr. Randol Kern, Carolyn Norton seems to have improved as compared to Wednesday. He agree's that she needs very aggressive rehabilitation to give her the best chance of improvement though this has been complicated by insurance.   At this time, Carolyn Norton is more stabilized and no longer appears to be close to end of life.   We will speak to family though I worry Carolyn Norton's parents may have some deficits as her mother and father do not remember the prior conversations  which were had in regards to goals of care per chart review.   I was able to speak to patients father, Carolyn Norton today. I shared that Carolyn Norton has had some improvements. He vocalizes wanting to continue to see if she can be optimized and ideally find her placement at a rehab facility. He shares that if Carolyn Norton is in a situation again where she is not eating or drinking and the underlying cause can't be determined we would have another conversation regarding goals. Presently goals are for improvement not comfort.   Carolyn Norton does share that he will get a copy of Carolyn Norton's advance directives sent to the Palliative care team so that we may formally have them placed in the chat/EMR.   If Carolyn Norton continues to improve Palliative Care will sign off as the primary issue is related to placement. We will follow for another day or so to see how things are going.   Questions and concerns addressed   Objective Assessment: Vital Signs Vitals:   03/07/21 1320 03/07/21 2139  BP: (!) 147/77 136/72  Pulse: 82 81  Resp:  16  Temp: 99.2 F (37.3 C) 99.1 F (37.3 C)  SpO2: 99% 99%    Intake/Output Summary (Last 24 hours) at 03/08/2021 0803 Last data filed at 03/08/2021 0225 Gross per 24 hour  Intake 1783.48 ml  Output 500 ml  Net 1283.48 ml    Last Weight  Most recent update: 01/11/2021 11:52 AM    Weight  99.8 kg (220 lb)            Gen:  Caucasian F in NAD HEENT: Dry  mucous  membranes CV: Regular rate and rhythm PULM: On 2LPM [place] ABD: soft/nontender EXT: No edema Neuro: Responds to name  SUMMARY OF RECOMMENDATIONS   DNAR/DNI, No G-Tube  Will request a copy of HCPOA documentation - we have none on file and there seems to be some disagreement over the decision maker for Carolyn Norton  Goals are for improvement as of presently, if Carolyn Norton declines and can no longer take in PO's we will reconvene  Appreciate SW helping patients father navigate Disability & Medicaid application   Ongoing  incremental palliative care support  Time Spent : 35 Greater than 50% of the time was spent in counseling and coordination of care ______________________________________________________________________________________ Lamarr Lulas [facility] Palliative Medicine Team Team Cell Phone: [phone] Please utilize secure chat with additional questions, if there is no response within 30 minutes please call the above phone number  Palliative Medicine Team providers are available by phone from 7am to 7pm daily and can be reached through the team cell phone.  Should this patient require assistance outside of these hours, please call the patient's attending physician.

## 2021-03-09 LAB — GLUCOSE, CAPILLARY
Glucose-Capillary: 160 mg/dL — ABNORMAL HIGH (ref 70–99)
Glucose-Capillary: 283 mg/dL — ABNORMAL HIGH (ref 70–99)
Glucose-Capillary: 211 mg/dL — ABNORMAL HIGH (ref 70–99)
Glucose-Capillary: 152 mg/dL — ABNORMAL HIGH (ref 70–99)

## 2021-03-09 LAB — LEVETIRACETAM LEVEL: Levetiracetam Lvl: <1 ug/mL — ABNORMAL LOW (ref 10.0–40.0)

## 2021-03-09 NOTE — Progress Notes (Signed)
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 Still unsure if NPH primary issues or other underlying problem due to h/o drug abuse/bipolar. I plan to update her family later today. RN said they were here yesterday- if this is irreversible she shouldn't get a PEG and should be comfort in my opinion"   HPI:  Per intake H&P --> 55 year old female past medical history of diabetes mellitus type 2, bipolar disorder type I, previous COVID infection, polysubstance abuse that includes cocaine.  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   Rubena Roseman has been at [facility] for the past fifty-three days is not eating or drinking sufficiently.  Palliative care has been asked to get re-involved to further discuss goals of care in the setting of progressive failure to thrive and generalized weakness.  Today's Discussion (03/09/2021): Chart reviewed.  I met with Sharyn Lull at bedside this morning. She was alert to self and situation. Reviewed her improvements on Abx and hydration. Her breakfast was in front of her which she was interested in eating. She was not able to feed herself though due to weakness in BUE. Otherwise she denies nausea or pain.  I called patients father, Shelly Flatten this morning. He shares that he plans to come by this morning. Reviewed that Aysa is stable this morning. He states that he is trying to get a copy of Sagrario's advance directives for the medical team to have on file.  If Perl continues to improve Palliative Care will sign off as the primary issue is related to placement.   Questions and concerns addressed   Objective  Assessment: Vital Signs Vitals:   03/08/21 2034 03/09/21 0557  BP: (!) 142/79 (!) 171/81  Pulse: 79 75  Resp: 16 16  Temp: (!) 97.4 F (36.3 C) 98.6 F (37 C)  SpO2: 97% 97%    Intake/Output Summary (Last 24 hours) at 03/09/2021 4967 Last data filed at 03/09/2021 5916 Gross per 24 hour  Intake 270 ml  Output 450 ml  Net -180 ml    Last Weight  Most recent update: 01/11/2021 11:52 AM    Weight  99.8 kg (220 lb)            Gen:  Caucasian F in NAD HEENT: Dry  mucous membranes CV: Regular rate and rhythm PULM: On 2LPM [place] ABD: soft/nontender EXT: No edema Neuro: Responds to name  SUMMARY OF RECOMMENDATIONS   DNAR/DNI, No G-Tube  Will request a copy of HCPOA documentation - we have none on file and there seems to be some disagreement over the decision maker for [place]. Patients father Shelly Flatten shares that he will bring in the documents this weekend.  Goals are for improvement as of presently, if [place] declines and can no longer take in PO's we will reconvene  Appreciate SW helping patients father navigate Disability & Medicaid application   Ongoing incremental palliative care support  Time Spent : 25 Greater than 50% of the time was spent in counseling and coordination of care ______________________________________________________________________________________ [place] Team Team Cell Phone: [phone] Please utilize secure chat with additional questions, if there is no response within 30 minutes please call the above phone  number  Palliative Medicine Team providers are available by phone from 7am to 7pm daily and can be reached through the team cell phone.  Should this patient require assistance outside of these hours, please call the patient's attending physician.

## 2021-03-09 NOTE — Progress Notes (Signed)
PROGRESS NOTE    Carolyn Norton  QMV:[id number] DOB: [birth year] DOA: 01/11/2021 PCP: [name], FNP    Chief Complaint  Patient presents with   Generalized Weakness   Multiple Falls   Medication Noncompliance   Altered Mental Status   Hyperglycemia    Brief Narrative:  COVID infection, polysubstance abuse that includes cocaine.  She presented from home where she lives with her parents.  Her presenting symptoms included generalized weakness, multiple falls and confusion.  Family confirmed that she had been having the symptoms for at least a month with last use of crack cocaine 1 month prior to presentation.  Ever since that time she had not been doing well.  She would also be having issues with hyperglycemia.  In the ER she was diagnosed with severe dehydration, hyperglycemia and metabolic encephalopathy.   Since admission patient has been evaluated by the psychiatric team who has deemed her alert and oriented and having capacity to make decisions.  Patient is agreeable to placement at a rehab.  Currently she has severe physical deconditioning which was POA and PT is recommending SNF.  Because of her history of polysubstance abuse and lack of funding SNF is not an option.  Family is agreeable to taking her back in home when she is able to ambulate well enough to get back and forth for meals into the bathroom.  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 -Neurology input greatly appreciated, felt initially concern for NPH contributing mainly to her symptoms as she had improvement after large-volume LP, she had recent volume NP with minimal improvement of mentation. -Recent episode of encephalopathy felt more likely in the setting of dehydration and infectious etiology due to UTI as it did improve with IV fluids and antibiotics. -Significant leg weakness, MRI cervical/thoracic/lumbar spine with no acute progressive pathology. -Continue  with PT/OT    Profound physical deconditioning Likely a combination of persistent altered mental status and inability to participate consistently with PT as well as myopathy of critical illness   Ventriculomegaly/? NPH Did improve after large-volume LP several weeks ago but now with progressive mental status changes-repeat large-volume LP 6/30 w/o any significant changes so unlikely NPH etiology to cognitive impairment Neurosurgery reconsulted on 6/29   Acute hypoxemia/COPD Patient with subtle increased work of breathing and tachypnea ABG with PaO2 of 65.9 with baseline in December 2021 of 89 Mild elevation in bicarbonate with normal pH and normal CO2 Chest x-ray unremarkable Suspect hypoxemia secondary to altered mental status with subsequent hyperventilation Start 2 L nasal cannula oxygen and monitor pulse oximetry continuously.   Dehydration with acute kidney injury Peak creatinine 1.38 on 6/27.  This in context of hyperglycemia 513 Creatinine has decreased to 0.86 therefore will decrease IV fluids to 75 cc/h   Anorexia/Odynophagia Currently n.p.o. secondary to altered mental status and awaiting reevaluation by speech therapy Once oral diet resume please reorder Megace  E. Coli [name] UTI  Urine cx + 60,000 colonies E. Coli- Treated initially with empiric  Rocephin but sensitive to Ancef so has been changed Suspect purewick contributing to recurrent UTI so order placed to apply only at HS Blood culture remains negative to date   Diabetes mellitus 2 with hyperglycemia on insulin Hemoglobin A1c 11.0 Change Lantus to 5 units HS Continue every 4 hours SSI but increased to resistant scale Metformin discontinued 6/28    Dyslipidemia Statin on hold while n.p.o.   History of bipolar 1 disorder/polysubstance abuse/cognitive impairment Continue to hold escitalopram, Neurontin and Depakote no n.p.o. Functional cognitive evaluation with significant abnormalities   GERD Oral PPI on hold while n.p.o.   Hypokalemia/hypomagnesemia Monitor and replete as needed  Protein calorie malnutrition -Overall appetite is poor, I have added Ensure for her  DVT prophylaxis: Lovenox Code Status: DNR Family Communication: None at bedside Disposition:   Status is: Inpatient  Remains inpatient appropriate because:IV treatments appropriate due to intensity of illness or inability to take PO  Dispo:  Patient From:    Planned Disposition: To be determined           Consultants:  Psychiatry Neurosurgery Palliative Neurology    Subjective:  No significant events overnight as discussed with staff, she remains with poor appetite.  Objective: Vitals:   03/08/21 1416 03/08/21 2034 03/09/21 0557 03/09/21 0947  BP: (!) 142/71 (!) 142/79 (!) 171/81   Pulse: 85 79 75   Resp: 17 16 16    Temp: 98 F (36.7 C) (!) 97.4 F (36.3 C) 98.6 F (37 C)   TempSrc: Oral Oral    SpO2: 99% 97% 97% 98%  Weight:      Height:        Intake/Output Summary (Last 24 hours) at 03/09/2021 1153 Last data filed at 03/09/2021 0526 Gross per 24 hour  Intake 250 ml  Output 450 ml  Net -200 ml   Filed Weights   01/11/21 1152  Weight: 99.8 kg    Examination:  Awake Alert, Oriented X 1, flat affect, minimally interactive, significantly deconditioned and  frail Symmetrical Chest wall movement, Good air movement bilaterally, CTAB RRR,No Gallops,Rubs or new Murmurs, No Parasternal Heave +ve B.Sounds, Abd Soft, No tenderness, No rebound - guarding or rigidity. No Cyanosis, Clubbing or edema, No new Rash or bruise        Data Reviewed: I have personally reviewed following labs and imaging studies  CBC: Recent Labs  Lab 03/03/21 0818 03/04/21 0844  WBC 11.1* 8.4  NEUTROABS 7.1 4.6  HGB 13.8 12.6  HCT 40.1 38.1  MCV 87.4 87.8  PLT 207 210    Basic Metabolic Panel: Recent Labs  Lab 03/03/21 0818 03/04/21 0844 03/05/21 0856 03/06/21 0412  NA 131* 134* 136 140  K 5.0 3.7 4.0 3.8  CL 92* 94* 99 104  CO2 21* 27 28 30   GLUCOSE 513* 348* 299* 167*  BUN 20 19 14 7   CREATININE 1.38* 1.17* 1.01* 0.86  CALCIUM 9.1 9.1 9.2 9.3    GFR: Estimated Creatinine Clearance: 90.2 mL/min (by C-G formula based on SCr of 0.86 mg/dL).  Liver Function Tests: Recent Labs  Lab 03/03/21 0818 03/05/21 0856  AST 14* 14*  ALT 14 11  ALKPHOS 48 42  BILITOT 1.1 0.4  PROT 6.5 6.1*  ALBUMIN 3.0* 2.7*    CBG: Recent Labs  Lab 03/08/21 0745 03/08/21 1136 03/08/21 1625 03/08/21 2034 03/09/21 0752  GLUCAP 134* 241* 213* 290* 160*     Recent Results (from the  past 240 hour(s))  Culture, Urine     Status: Abnormal   Collection Time: 03/03/21  8:15 AM   Specimen: Urine, Catheterized  Result Value Ref Range Status   Specimen Description URINE, CATHETERIZED  Final   Special Requests   Final    NONE Performed at [facility], [address], [place], [place] [address]    Culture (A)  Final    60,000 COLONIES/mL ESCHERICHIA COLI 10,000 COLONIES/mL KLEBSIELLA PNEUMONIAE    Report Status 03/05/2021 FINAL  Final   Organism ID, Bacteria ESCHERICHIA COLI (A)  Final   Organism ID, Bacteria KLEBSIELLA PNEUMONIAE (A)  Final      Susceptibility   Escherichia coli - MIC*    AMPICILLIN >=32 RESISTANT Resistant     CEFAZOLIN <=4  SENSITIVE Sensitive     CEFEPIME <=0.12 SENSITIVE Sensitive     CEFTRIAXONE <=0.25 SENSITIVE Sensitive     CIPROFLOXACIN >=4 RESISTANT Resistant     GENTAMICIN >=16 RESISTANT Resistant     IMIPENEM <=0.25 SENSITIVE Sensitive     NITROFURANTOIN <=16 SENSITIVE Sensitive     TRIMETH/SULFA <=20 SENSITIVE Sensitive     AMPICILLIN/SULBACTAM 16 INTERMEDIATE Intermediate     PIP/TAZO <=4 SENSITIVE Sensitive     * 60,000 COLONIES/mL ESCHERICHIA COLI   Klebsiella pneumoniae - MIC*    AMPICILLIN RESISTANT Resistant     CEFAZOLIN <=4 SENSITIVE Sensitive     CEFEPIME <=0.12 SENSITIVE Sensitive     CEFTRIAXONE <=0.25 SENSITIVE Sensitive     CIPROFLOXACIN <=0.25 SENSITIVE Sensitive     GENTAMICIN <=1 SENSITIVE Sensitive     IMIPENEM <=0.25 SENSITIVE Sensitive     NITROFURANTOIN 64 INTERMEDIATE Intermediate     TRIMETH/SULFA <=20 SENSITIVE Sensitive     AMPICILLIN/SULBACTAM <=2 SENSITIVE Sensitive     PIP/TAZO <=4 SENSITIVE Sensitive     * 10,000 COLONIES/mL KLEBSIELLA PNEUMONIAE  Culture, blood (Routine X 2) w Reflex to ID Panel     Status: None (Preliminary result)   Collection Time: 03/05/21  1:06 PM   Specimen: BLOOD LEFT HAND  Result Value Ref Range Status   Specimen Description BLOOD LEFT HAND  Final   Special Requests   Final    BOTTLES DRAWN AEROBIC AND ANAEROBIC Blood Culture adequate volume   Culture   Final    NO GROWTH 4 DAYS Performed at [facility], [address] [place], [place] [address]    Report Status PENDING  Incomplete  Culture, blood (Routine X 2) w Reflex to ID Panel     Status: None (Preliminary result)   Collection Time: 03/05/21  1:14 PM   Specimen: BLOOD RIGHT HAND  Result Value Ref Range Status   Specimen Description BLOOD RIGHT HAND  Final   Special Requests   Final    BOTTLES DRAWN AEROBIC AND ANAEROBIC Blood Culture adequate volume   Culture   Final    NO GROWTH 4 DAYS Performed at [facility], [address], [place], [place] [address]     Report Status PENDING  Incomplete  CSF culture w Gram Stain     Status: None (Preliminary result)   Collection Time: 03/06/21 10:23 AM   Specimen: PATH Cytology CSF; Cerebrospinal Fluid  Result Value Ref Range Status   Specimen Description CSF  Final   Special Requests NONE  Final   Gram Stain   Final    WBC PRESENT, PREDOMINANTLY MONONUCLEAR NO ORGANISMS SEEN CYTOSPIN SMEAR    Culture   Final  NO GROWTH 3 DAYS Performed at [facility], [address] [place], [place] [address]    Report Status PENDING  Incomplete         Radiology Studies: MR CERVICAL SPINE W WO CONTRAST  Result Date: 03/08/2021 CLINICAL DATA:  55 year old female who has been admitted with altered mental status, possible NPH including gait abnormality. Unexplained cognitive and functional decline. Status post two high-volume lumbar punctures, most recently 03/06/2021 - reportedly without the clinical improvement observed after the 1st high-volume LP earlier that month. Unexplained leg weakness. EXAM: MRI CERVICAL SPINE WITHOUT AND WITH CONTRAST TECHNIQUE: Multiplanar and multiecho pulse sequences of the cervical spine, to include the craniocervical junction and cervicothoracic junction, were obtained without and with intravenous contrast. CONTRAST:  31mL GADAVIST GADOBUTROL 1 MMOL/ML IV SOLN COMPARISON:  CT cervical spine 10/21/2016.  Brain MRI 02/02/2021. FINDINGS: Alignment: Chronic straightening of cervical lordosis with mild focal lordosis at C5-C6, increased since 2018. Vertebrae: Endplate marrow edema and enhancement at C5-C6, although no associated paraspinal soft tissue inflammation. Abnormal signal in the disc space appears primarily related to disc space loss. No other No marrow edema or evidence of acute osseous abnormality. Background bone marrow signal is normal. Cord: No definite cervical cord signal abnormality, despite degenerative mass effect at C5-C6 detailed below. Above and below that level  cervical cord signal and morphology appear normal. No abnormal intradural enhancement. No dural thickening. Posterior Fossa, vertebral arteries, paraspinal tissues: Cervicomedullary junction is within normal limits. Stable visible posterior fossa. Preserved major vascular flow voids in the neck with a dominant right vertebral artery. Negative visible neck soft tissues and lung apices. Disc levels: C2-C3: Mild left and moderate to severe right facet hypertrophy. No spinal stenosis but severe right C3 foraminal stenosis. C3-C4: Mild disc bulge. Mild endplate spurring. Mild left and moderate to severe right facet hypertrophy. Mild left and moderate to severe right C4 foraminal stenosis. C4-C5: Minor disc bulge and endplate spurring. Mild to moderate facet hypertrophy greater on the right. Mild left and moderate to severe right C5 foraminal stenosis. C5-C6: Disc space loss. Circumferential disc osteophyte complex with a broad-based posterior component, mild left paracentral and caudal extension of disc. Superimposed mild facet and ligament flavum hypertrophy. Spinal stenosis with mild spinal cord mass effect. Severe bilateral C6 foraminal stenosis. C6-C7: Disc space loss. Circumferential disc osteophyte complex with broad-based posterior component. Mild spinal stenosis. No convincing cord mass effect. Moderate to severe C7 foraminal stenosis greater on the left. C7-T1: Circumferential disc osteophyte complex most affecting the foramina. Mild to moderate facet and ligament flavum hypertrophy. No significant spinal stenosis. Moderate to severe bilateral C8 foraminal stenosis. Thoracic levels are reported separately today. IMPRESSION: 1. Advanced for age cervical spine degeneration. 2. Spinal stenosis with mild cord mass effect at C5-C6 where abnormal signal at the disc and endplates appear to be degenerative rather than infectious or inflammatory. But no abnormal cord signal identified. Mild degenerative spinal stenosis  at C6-C7 without cord mass effect. 3. Widespread bilateral moderate to severe degenerative cervical foraminal stenosis, including involvement of all right side cervical foramina. Electronically Signed   By: Odessa Fleming M.D.   On: 03/08/2021 07:14   MR THORACIC SPINE W WO CONTRAST  Result Date: 03/08/2021 CLINICAL DATA:  55 year old female who has been admitted with altered mental status, possible NPH including gait abnormality. Unexplained cognitive and functional decline. Status post two high-volume lumbar punctures, most recently 03/06/2021 - reportedly without the clinical improvement observed after the 1st high-volume LP earlier  that month. Unexplained leg weakness. EXAM: MRI THORACIC WITHOUT AND WITH CONTRAST TECHNIQUE: Multiplanar and multiecho pulse sequences of the thoracic spine were obtained without and with intravenous contrast. CONTRAST:  26mL GADAVIST GADOBUTROL 1 MMOL/ML IV SOLN in conjunction with contrast enhanced imaging of the cervical spine reported separately. COMPARISON:  Cervical spine MRI today reported separately. CT Chest, Abdomen, and Pelvis 10/21/2016. FINDINGS: Limited cervical spine imaging:  Reported separately today. Thoracic spine segmentation:  Normal on the 2018 comparison. Alignment: Stable thoracic kyphosis since 2018, normal to mildly exaggerated. No spondylolisthesis. Vertebrae: No marrow edema or evidence of acute osseous abnormality. Subtle chronic T4 superior endplate compression fracture is stable since 2018. Chronic degenerative T6-T7 endplate marrow signal changes. Normal background bone marrow signal. Cord: Thoracic spinal cord appears to remain normal, despite degenerative changes detailed below. No cord myelomalacia or signal abnormality. No abnormal intradural enhancement. No dural thickening. Conus medullaris appears normal at T12. Paraspinal and other soft tissues: Negative. Disc levels: T1-T2: Foraminal disc osteophyte complex. Mild facet hypertrophy. No spinal  stenosis. Moderate bilateral T1 foraminal stenosis. T2-T3: Disc space loss. Broad-based posterior disc bulge or protrusion involving the neural foramina left greater than right. Effaced ventral CSF space but no spinal stenosis. Severe left and moderate right T2 foraminal stenosis. T3-T4: Mild endplate spurring.  Otherwise negative. T4-T5: Mild disc bulging.  No stenosis. T5-T6: Mild disc bulging and facet hypertrophy.  No stenosis. T6-T7: Chronic disc space loss, severe on the 2018 CT. Circumferential disc osteophyte complex eccentric to the right. Effaced ventral CSF space, but dorsal thecal sac remains patent without significant spinal stenosis. No foraminal stenosis. T7-T8: Small cephalad series 25, image 22 disc extrusion in the midline (and series 22, image 8. But no spinal or foraminal stenosis. T8-T9: Broad-based right paracentral and slightly cephalad disc protrusion (series 25, image 25. Effaced ventral CSF space but no spinal stenosis. No foraminal stenosis. T9-T10: Disc space loss. Circumferential disc osteophyte complex. Mild facet hypertrophy. Effaced ventral CSF space without spinal stenosis. Mild T9 foraminal stenosis. T10-T11: Mild disc bulge. Mild facet hypertrophy. No spinal stenosis. Mild T10 foraminal stenosis. T11-T12: Negative. T12-L1: Negative. IMPRESSION: 1. No acute osseous abnormality in the thoracic spine. Mild chronic T4 compression fracture is stable since 2018. 2. Thoracic spinal cord remains normal, with no significant spinal stenosis despite widespread thoracic spine degeneration. Chronic disc and endplate degeneration most pronounced at T6-T7. 3. Moderate to severe degenerative foraminal stenosis at T2-T3. Mild foraminal stenosis at T9-T10 and T10-T11. Electronically Signed   By: Odessa Fleming M.D.   On: 03/08/2021 07:28   MR Lumbar Spine W Wo Contrast  Result Date: 03/08/2021 CLINICAL DATA:  54 year old female who has been admitted with altered mental status, possible NPH including  gait abnormality. Unexplained cognitive and functional decline. Status post two high-volume lumbar punctures, most recently 03/06/2021 - reportedly without the clinical improvement observed after the 1st high-volume LP earlier that month. Unexplained leg weakness. EXAM: MRI LUMBAR SPINE WITHOUT AND WITH CONTRAST TECHNIQUE: Multiplanar and multiecho pulse sequences of the lumbar spine were obtained without and with intravenous contrast. CONTRAST:  56mL GADAVIST GADOBUTROL 1 MMOL/ML IV SOLN in conjunction with contrast enhanced imaging of the cervical and thoracic spine reported separately. COMPARISON:  Lumbar puncture 03/06/2021 and earlier. CT Abdomen and Pelvis 10/21/2016. Thoracic spine MRI today. FINDINGS: Segmentation:  Normal, concordant with the thoracic numbering today. Alignment:  Normal lumbar lordosis. Vertebrae: No marrow edema or evidence of acute osseous abnormality. Mild S1 superior endplate Schmorl's node is chronic. Visualized bone  marrow signal is within normal limits. Intact visible sacrum and SI joints. Conus medullaris and cauda equina: Conus extends to the T12 level, and was better detailed on the thoracic MRI today. Cauda equina nerve roots appear normal aside from levels of stenosis detailed below. No nerve root thickening. No abnormal intradural enhancement. No dural thickening. Paraspinal and other soft tissues: Negative visible abdominal viscera. There is right greater than left patchy but confluent bilateral lumbar erector spinae STIR hyperintensity with faint enhancement. See series 2, image 5 on the right and image 14 on the left. This is primarily from the L3 level and caudally. No intramuscular fluid collection. No other paraspinal soft tissue inflammation, the epidural spaces and psoas muscles appear normal. Disc levels: T12-L1:  Negative. L1-L2:  Negative disc.  Mild facet hypertrophy.  No stenosis. L2-L3: Mild foraminal disc bulging. Mild congenital spinal canal narrowing begins  here due to short pedicle distance (series 4, image 13). Mild posterior element hypertrophy. No significant spinal stenosis. Mild L2 foraminal stenosis greater on the left. L3-L4: Congenital canal narrowing with circumferential disc bulge and mild to moderate facet and ligament flavum hypertrophy. Enhancement of the interspinous ligament appears to be degenerative in nature. Mild to moderate spinal stenosis (series 4, image 15). Mild bilateral L3 foraminal stenosis greater on the left. L4-L5: Circumferential disc bulge, moderate facet and ligament flavum hypertrophy superimposed on congenital canal narrowing. Similar interspinous ligament thickening and enhancement here. Moderate spinal stenosis (series 4, image 20). Mild L4 foraminal stenosis greater on the right. L5-S1: Congenital canal narrowing abates at this level. Negative disc. Mild facet hypertrophy. No stenosis. IMPRESSION: 1. Patchy inflammatory signal in the lumbar erector spinae muscles right greater than left. No intramuscular fluid collection and no other paraspinal soft tissue inflammation. This is compatible with nonspecific myositis and can be seen with chronically debilitated patients. 2. 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 Aside from stenosis the cauda equina nerve roots appear normal. Electronically Signed   By: Odessa Fleming M.D.   On: 03/08/2021 07:36        Scheduled Meds:  enoxaparin (LOVENOX) injection  40 mg Subcutaneous Q24H   feeding supplement  237 mL Oral BID BM   insulin aspart  0-20 Units Subcutaneous TID AC & HS   insulin glargine  5 Units Subcutaneous QHS   magic mouthwash w/lidocaine  10 mL Oral QID   Continuous Infusions:   ceFAZolin (ANCEF) IV 1 g (03/09/21 0526)   levETIRAcetam 500 mg (03/09/21 1044)     LOS: 56 days      Huey Bienenstock, MD [facility]   To contact the attending provider between 7A-7P or the covering provider during after hours  7P-7A, please log into the web site [email or web address] and access using universal [place] password for that web site. If you do not have the password, please call the hospital operator.  03/09/2021, 11:53 AM

## 2021-03-10 LAB — CULTURE, BLOOD (ROUTINE X 2)
Culture: NO GROWTH
Culture: NO GROWTH
Special Requests: ADEQUATE
Special Requests: ADEQUATE

## 2021-03-10 LAB — CBC
HCT: 37.8 % (ref 36.0–46.0)
Hemoglobin: 12.5 g/dL (ref 12.0–15.0)
MCH: 29.2 pg (ref 26.0–34.0)
MCHC: 33.1 g/dL (ref 30.0–36.0)
MCV: 88.3 fL (ref 80.0–100.0)
Platelets: 333 10*3/uL (ref 150–400)
RBC: 4.28 MIL/uL (ref 3.87–5.11)
RDW: 15 % (ref 11.5–15.5)
WBC: 9.2 10*3/uL (ref 4.0–10.5)
nRBC: 0 % (ref 0.0–0.2)

## 2021-03-10 LAB — CSF CULTURE W GRAM STAIN: Culture: NO GROWTH

## 2021-03-10 LAB — GLUCOSE, CAPILLARY
Glucose-Capillary: 254 mg/dL — ABNORMAL HIGH (ref 70–99)
Glucose-Capillary: 272 mg/dL — ABNORMAL HIGH (ref 70–99)
Glucose-Capillary: 139 mg/dL — ABNORMAL HIGH (ref 70–99)
Glucose-Capillary: 79 mg/dL (ref 70–99)
Glucose-Capillary: 214 mg/dL — ABNORMAL HIGH (ref 70–99)

## 2021-03-10 LAB — BASIC METABOLIC PANEL
Anion gap: 6 (ref 5–15)
BUN: 6 mg/dL (ref 6–20)
CO2: 28 mmol/L (ref 22–32)
Calcium: 9.1 mg/dL (ref 8.9–10.3)
Chloride: 105 mmol/L (ref 98–111)
Creatinine, Ser: 0.81 mg/dL (ref 0.44–1.00)
GFR, Estimated: >60 mL/min (ref >60)
Glucose, Bld: 148 mg/dL — ABNORMAL HIGH (ref 70–99)
Potassium: 3.3 mmol/L — ABNORMAL LOW (ref 3.5–5.1)
Sodium: 139 mmol/L (ref 135–145)

## 2021-03-10 MED ORDER — MEGESTROL ACETATE 400 MG/10ML PO SUSP
800.0000 mg | Freq: Every day | ORAL | Status: DC
  Administered 2021-03-10 – 2021-03-14 (×5): 800 mg via ORAL
  Filled 2021-03-10 (×6): qty 20

## 2021-03-10 MED ORDER — LEVETIRACETAM 500 MG PO TABS
500.0000 mg | ORAL_TABLET | Freq: Two times a day (BID) | ORAL | Status: DC
  Administered 2021-03-10 – 2021-03-11 (×3): 500 mg via ORAL
  Filled 2021-03-10 (×3): qty 1

## 2021-03-10 NOTE — Progress Notes (Signed)
  Speech Language Pathology Treatment: Dysphagia  Patient Details Name: Carolyn Norton MRN: [id number] DOB: 1966/03/10 Today's Date: 03/10/2021 Time: 6759-1638 SLP Time Calculation (min) (ACUTE ONLY): 14 min  Assessment / Plan / Recommendation Clinical Impression  Pt was seen for dysphagia treatment. Pt's nurse, Florentina Addison, reported increased lethargy, need for cueing to swallow, and prolonged mastication during meals. Pt kept her eyes closed throughout the session, but she participated well. Pt was seen with some trials from breakfast tray. She demonstrated moderate to significantly prolonged mastication of dysphagia 3 solids, but cueing was not required for swallowing initiation and she demonstrated adequate oral clearance. Mastication was functional with dysphagia 2 solids. No s/sx of aspiration were noted with solids or with consecutive swallows of thin liquids via straw. Pt's diet will be modified to dysphagia 2 solids and thin liquids. SLP will continue to follow pt.    HPI HPI: 55 year old female past medical history of diabetes mellitus type 2, bipolar disorder type I, previous COVID infection, polysubstance abuse that includes cocaine.  She presented from home where she lives with her parents.  Her presenting symptoms included generalized weakness, multiple falls and confusion. Has been seen by SLP in prior admissions for poor intake or vague report of dysphagia, though pt has always presented with normal swallowing, but impaired cognition.      SLP Plan  Continue with current plan of care       Recommendations  Diet recommendations: Dysphagia 2 (fine chop);Thin liquid Liquids provided via: Cup;Straw Medication Administration: Crushed with puree Supervision: Trained caregiver to feed patient Compensations: Minimize environmental distractions Postural Changes and/or Swallow Maneuvers: Seated upright 90 degrees                Oral Care Recommendations: Oral care BID Follow up  Recommendations: 24 hour supervision/assistance Plan: Continue with current plan of care       Zyanna Leisinger I. Vear Clock, MS, CCC-SLP Acute Rehabilitation Services Office number [phone] Pager [phone]                Scheryl Marten 03/10/2021, 9:20 AM

## 2021-03-10 NOTE — Progress Notes (Signed)
[facility] PROGRESS NOTE  Carolyn Norton JAS:[id number] DOB: 05-18-1966 DOA: 01/11/2021 PCP: [name], FNP     Status: Remains inpatient appropriate because:Unsafe d/c plan-due to patient's underlying poor mental status short-term memory deficits she will require 24/7 care after discharge  Dispo:  Patient From:  Home  Planned Disposition: SNF  Medically stable for discharge:  yes  Barriers to DC: Continues to require significant assistance with mobility.  Continues to have significant short-term memory deficits impeding patient's ability to safely return home with elderly parents; please note that the patient's father continues to work outside of the home therefore the burden of care would follow-up on the patient's elderly mother             Difficult to place: Yes  Level of care: Med-Surg  Code Status: DNR Family Communication: Father 7/1 via VM DVT prophylaxis: Lovenox COVID vaccination status: Unknown-post COVID infection 09/06/20   HPI: 55 year old female past medical history of diabetes mellitus type 2, bipolar disorder type I, previous COVID infection, polysubstance abuse that includes cocaine.  She presented from home where she lives with her parents.  Her presenting symptoms included generalized weakness, multiple falls and confusion.  Family confirmed that she had been having the symptoms for at least a month with last use of crack cocaine 1 month prior to presentation.  Ever since that time she had not been doing well.  She would also be having issues with hyperglycemia.  In the ER she was diagnosed with severe dehydration, hyperglycemia and metabolic encephalopathy.  Since admission patient has been evaluated by the psychiatric team who has deemed her alert and oriented and having capacity to make decisions.  Patient is agreeable to placement at a rehab.  Currently she has severe physical deconditioning which was POA and PT is recommending SNF.  Because of  her history of polysubstance abuse and lack of funding SNF is not an option.  Family is agreeable to taking her back in home when she is able to ambulate well enough to get back and forth for meals into the bathroom.  Subjective: Patient resting with eyes closed upon my entry into the room.  She opened her eyes smiled and gave a brief verbal response to questions asked.  Unfortunately was unable to follow any simple commands and was oriented to name only.  Nurse at bedside stated patient was not eating but a few bites today.   Objective: Vitals:   03/09/21 2110 03/10/21 0326  BP: 134/85 128/68  Pulse: 80 78  Resp: 16 16  Temp: 97.8 F (36.6 C) 97.8 F (36.6 C)  SpO2: 99% 99%    Intake/Output Summary (Last 24 hours) at 03/10/2021 0726 Last data filed at 03/10/2021 0331 Gross per 24 hour  Intake 150 ml  Output 1100 ml  Net -950 ml   Filed Weights   01/11/21 1152  Weight: 99.8 kg    Exam:  Constitutional: Awakens, drifts off to sleep easily.  No acute physical distress Respiratory: Lungs are clear, stable on room air, no increased work of breathing Cardiac: S1-S2, pulse remains regular, normotensive. Abdomen: LBM 6/27, nontender nondistended with normoactive bowel sounds. Neurologic: Cranial nerves are grossly intact.  Patient unable to follow commands so unable to accurately test strength but in the past week she demonstrated upper extremity dysmetria with lower extremity strength 2/5. Psychiatric: Awakens but is oriented only to name.  Very confused and told me she had eaten well this morning with the nurse clarifying very poor  oral intake.  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  Initially felt to be partially due to NPH physiology given after initial large-volume LP procedure she did have some improvement in mobility and cognition.  Patient had another significant decline in mentation that was later deemed secondary to acute UTI noting her mentation rapidly improved after administration of antibiotics. Her went MRI of her spine without any abnormalities to explain her progressive lower extremity weakness. After multiple discussions with the neurology team it was felt that patient likely has an underlying dementia type process related to prior significant drug use in combination with her known bipolar illness.  Recommendation is to continue current DNR status but patient has improved to the point where comfort care is no longer indicated although after discussions with the palliative medicine team throughout the hospitalization family continues to decline PEG tube Given her altered mentation recently before resuming oral diet we will ask for speech to reevaluate  Profound physical deconditioning Likely a combination of persistent altered mental status and inability to participate consistently with PT as well as myopathy of critical illness  Ventriculomegaly/? NPH NPH ruled out as above  Acute hypoxemia/COPD Resolved and felt to be secondary to hypoventilation from altered mentation  Dehydration with acute kidney injury/hyperkalemia Peak creatinine 1.38 on 6/27.  This in context of hyperglycemia 513 Creatinine has decreased to 0.86 therefore will decrease IV fluids to 75 cc/h  Anorexia/Odynophagia 7/4 resume Megace and continue DVT prophylaxis as long as this medication is in place Family confirms that they do not wish for PEG tube placement  E. Coli [name] UTI Treated Use pure wick only at bedtime  Diabetes mellitus 2 with hyperglycemia on insulin Hemoglobin A1c 11.0 Change Lantus to 5 units HS Continue every 4 hours SSI     Dyslipidemia Statin on hold while n.p.o.  History of bipolar 1 disorder/polysubstance abuse/cognitive impairment Continue to hold escitalopram, Neurontin and Depakote no n.p.o. Functional cognitive evaluation with significant abnormalities  Physical deconditioning/bilateral heel cord shortening Continue bilateral PRAFO boots on every 4 hours and off every 4 hours    Other problems: GERD Oral PPI on hold while n.p.o.  Hypokalemia/hypomagnesemia Resolved  Data Reviewed: Basic Metabolic Panel: Recent Labs  Lab 03/03/21 0818 03/04/21 0844 03/05/21 0856 03/06/21 0412 03/10/21 0357  NA 131* 134* 136 140 139  K 5.0 3.7 4.0 3.8 3.3*  CL 92* 94* 99 104 105  CO2 21* 27 28 30 28   GLUCOSE 513* 348* 299* 167* 148*  BUN 20 19 14 7 6   CREATININE 1.38* 1.17* 1.01* 0.86 0.81  CALCIUM 9.1 9.1 9.2 9.3 9.1   Liver Function Tests: Recent Labs  Lab 03/03/21 0818 03/05/21 0856  AST 14* 14*  ALT 14 11  ALKPHOS 48 42  BILITOT 1.1 0.4  PROT 6.5 6.1*  ALBUMIN 3.0* 2.7*   No results for input(s): LIPASE, AMYLASE in the last 168 hours. No results for input(s): AMMONIA in the last 168 hours. CBC: Recent Labs  Lab 03/03/21 0818 03/04/21 0844 03/10/21 0357  WBC 11.1* 8.4 9.2  NEUTROABS 7.1 4.6  --   HGB 13.8 12.6 12.5  HCT 40.1 38.1 37.8  MCV 87.4 87.8 88.3  PLT 207 210 333   Cardiac Enzymes: No results for input(s): CKTOTAL, CKMB, CKMBINDEX,  TROPONINI in the last 168 hours. BNP (last 3 results) Recent Labs    01/14/21 0238 01/15/21 0230 01/16/21 0201  BNP 177.8* 115.9* 40.9    ProBNP (last 3 results) No results for input(s): PROBNP in the last 8760 hours.  CBG: Recent Labs  Lab 03/08/21 2034 03/09/21 0752 03/09/21 1227 03/09/21 1626 03/09/21 2105  GLUCAP 290* 160* 283* 211* 152*    Recent Results (from the past 240 hour(s))  Culture, Urine     Status: Abnormal   Collection Time: 03/03/21  8:15 AM   Specimen: Urine, Catheterized  Result Value Ref  Range Status   Specimen Description URINE, CATHETERIZED  Final   Special Requests   Final    NONE Performed at [facility], [address] [place], [place] [address]    Culture (A)  Final    60,000 COLONIES/mL ESCHERICHIA COLI 10,000 COLONIES/mL KLEBSIELLA PNEUMONIAE    Report Status 03/05/2021 FINAL  Final   Organism ID, Bacteria ESCHERICHIA COLI (A)  Final   Organism ID, Bacteria KLEBSIELLA PNEUMONIAE (A)  Final      Susceptibility   Escherichia coli - MIC*    AMPICILLIN >=32 RESISTANT Resistant     CEFAZOLIN <=4 SENSITIVE Sensitive     CEFEPIME <=0.12 SENSITIVE Sensitive     CEFTRIAXONE <=0.25 SENSITIVE Sensitive     CIPROFLOXACIN >=4 RESISTANT Resistant     GENTAMICIN >=16 RESISTANT Resistant     IMIPENEM <=0.25 SENSITIVE Sensitive     NITROFURANTOIN <=16 SENSITIVE Sensitive     TRIMETH/SULFA <=20 SENSITIVE Sensitive     AMPICILLIN/SULBACTAM 16 INTERMEDIATE Intermediate     PIP/TAZO <=4 SENSITIVE Sensitive     * 60,000 COLONIES/mL ESCHERICHIA COLI   Klebsiella pneumoniae - MIC*    AMPICILLIN RESISTANT Resistant     CEFAZOLIN <=4 SENSITIVE Sensitive     CEFEPIME <=0.12 SENSITIVE Sensitive     CEFTRIAXONE <=0.25 SENSITIVE Sensitive     CIPROFLOXACIN <=0.25 SENSITIVE Sensitive     GENTAMICIN <=1 SENSITIVE Sensitive     IMIPENEM <=0.25 SENSITIVE Sensitive     NITROFURANTOIN 64 INTERMEDIATE Intermediate     TRIMETH/SULFA <=20 SENSITIVE Sensitive     AMPICILLIN/SULBACTAM <=2 SENSITIVE Sensitive     PIP/TAZO <=4 SENSITIVE Sensitive     * 10,000 COLONIES/mL KLEBSIELLA PNEUMONIAE  Culture, blood (Routine X 2) w Reflex to ID Panel     Status: None (Preliminary result)   Collection Time: 03/05/21  1:06 PM   Specimen: BLOOD LEFT HAND  Result Value Ref Range Status   Specimen Description BLOOD LEFT HAND  Final   Special Requests   Final    BOTTLES DRAWN AEROBIC AND ANAEROBIC Blood Culture adequate volume   Culture   Final    NO GROWTH 4 DAYS Performed at [facility], [address] [place], [place] [address]    Report Status PENDING  Incomplete  Culture, blood (Routine X 2) w Reflex to ID Panel     Status: None (Preliminary result)   Collection Time: 03/05/21  1:14 PM   Specimen: BLOOD RIGHT HAND  Result Value Ref Range Status   Specimen Description BLOOD RIGHT HAND  Final   Special Requests   Final    BOTTLES DRAWN AEROBIC AND ANAEROBIC Blood Culture adequate volume   Culture   Final    NO GROWTH 4 DAYS Performed at [facility], [address] [place], [place] [address]    Report Status PENDING  Incomplete  CSF culture w  Gram Stain     Status: None (Preliminary result)   Collection Time: 03/06/21 10:23 AM   Specimen: PATH Cytology CSF; Cerebrospinal Fluid  Result Value Ref Range Status   Specimen Description CSF  Final   Special Requests NONE  Final   Gram Stain   Final    WBC PRESENT, PREDOMINANTLY MONONUCLEAR NO ORGANISMS SEEN CYTOSPIN SMEAR    Culture   Final    NO GROWTH 3 DAYS Performed at [facility], [address], [place], [place] [address]    Report Status PENDING  Incomplete      Studies: No results found.  Scheduled Meds:  enoxaparin (LOVENOX) injection  40 mg Subcutaneous Q24H   feeding supplement  237 mL Oral BID BM   insulin aspart  0-20 Units Subcutaneous TID AC & HS   insulin glargine  5 Units Subcutaneous QHS   magic mouthwash w/lidocaine  10 mL Oral QID   Continuous Infusions:  levETIRAcetam 500 mg (03/09/21 2152)     Principal Problem:   Bipolar 1 disorder, depressed, full remission (HCC) Active Problems:   Bipolar 1 disorder (HCC)   Hypertension   Generalized weakness   Controlled type 2 diabetes mellitus with hyperglycemia, with long-term current use of insulin (HCC)   Hyperlipidemia   NPH (normal pressure hydrocephalus) (HCC)   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   Consultants: Psychiatry Neurology Neurosurgery  Procedures: None  Antibiotics: Anti-infectives (From  admission, onward)    Start     Dose/Rate Route Frequency Ordered Stop   03/06/21 0900  ceFAZolin (ANCEF) IVPB 1 g/50 mL premix        1 g 100 mL/hr over 30 Minutes Intravenous Every 8 hours 03/06/21 0801 03/10/21 0559   03/04/21 1430  cefTRIAXone (ROCEPHIN) 1 g in sodium chloride 0.9 % 100 mL IVPB  Status:  Discontinued        1 g 200 mL/hr over 30 Minutes Intravenous Every 24 hours 03/04/21 1335 03/06/21 0800   02/14/21 0828  vancomycin (VANCOREADY) IVPB 1000 mg/200 mL  Status:  Discontinued        1,000 mg 200 mL/hr over 60 Minutes Intravenous 60 min pre-op 02/14/21 0828 03/02/21 1139   02/13/21 2115  cefTRIAXone (ROCEPHIN) 1 g in sodium chloride 0.9 % 100 mL IVPB  Status:  Discontinued        1 g 200 mL/hr over 30 Minutes Intravenous Every 24 hours 02/13/21 2016 02/16/21 0844        Time spent: 25 minutes    [name] ANP  [facility] 7 am - 330 pm/M-F for direct patient care and secure chat Please refer to Amion for contact info 57  days

## 2021-03-11 LAB — GLUCOSE, CAPILLARY
Glucose-Capillary: 250 mg/dL — ABNORMAL HIGH (ref 70–99)
Glucose-Capillary: 248 mg/dL — ABNORMAL HIGH (ref 70–99)
Glucose-Capillary: 181 mg/dL — ABNORMAL HIGH (ref 70–99)
Glucose-Capillary: 219 mg/dL — ABNORMAL HIGH (ref 70–99)

## 2021-03-11 LAB — BASIC METABOLIC PANEL
Anion gap: 11 (ref 5–15)
BUN: 7 mg/dL (ref 6–20)
CO2: 27 mmol/L (ref 22–32)
Calcium: 9.4 mg/dL (ref 8.9–10.3)
Chloride: 101 mmol/L (ref 98–111)
Creatinine, Ser: 0.84 mg/dL (ref 0.44–1.00)
GFR, Estimated: >60 mL/min (ref >60)
Glucose, Bld: 281 mg/dL — ABNORMAL HIGH (ref 70–99)
Potassium: 3.8 mmol/L (ref 3.5–5.1)
Sodium: 139 mmol/L (ref 135–145)

## 2021-03-11 MED ORDER — LEVETIRACETAM 250 MG PO TABS
250.0000 mg | ORAL_TABLET | Freq: Two times a day (BID) | ORAL | Status: DC
  Administered 2021-03-11 – 2021-03-12 (×3): 250 mg via ORAL
  Filled 2021-03-11 (×5): qty 1

## 2021-03-11 NOTE — Progress Notes (Signed)
Occupational Therapy Treatment Patient Details Name: Carolyn Norton MRN: [id number] DOB: 1966-03-21 Today's Date: 03/11/2021    History of present illness Pt is a 55 y.o. female admitted 01/11/21 with multiple falls, AMS, weakness. Workup for severe dehydration, DKA, metabolic encephalopathy. 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 Neurosurgery following for possible lumbar drain trial vs repeat LP vs VP shunt placement. Placement of lumbar drain on 6/10 cancelled due to UTI. PMH includes DM2, COPD, CKD3, GERD, bipolar disorder, COVID-19, polysubstance abuse including cocaine.  Had repeat LP on 6/30 and post NPH eval done.   OT comments  Patient met lying supine in bed. Lethargy limiting EOB/OOB activity this date. Patient only intermittently responding to name being called and following very few (if any) verbal commands. Patient keeping eyes closed for majority of session despite max multimodal cues and environmental modifications. Patient continues to require Total A grossly for all self-care tasks including taking sips of  OJ from cup. OT will continue to follow acutely.    Follow Up Recommendations  SNF;Supervision/Assistance - 24 hour    Equipment Recommendations  None recommended by OT    Recommendations for Other Services      Precautions / Restrictions Precautions Precautions: Fall Restrictions Weight Bearing Restrictions: No       Mobility Bed Mobility Overal bed mobility: Needs Assistance Bed Mobility: Rolling;Sidelying to Sit Rolling: Total assist;+2 for physical assistance Sidelying to sit: Total assist;+2 for physical assistance   Sit to supine: Total assist;+2 for physical assistance   General bed mobility comments: Total A +2 for all parts of bed mobility this date. Patient did not attempt to assist.    Transfers                 General transfer comment: Deferred secondary to lethargy.    Balance Overall balance  assessment: Needs assistance Sitting-balance support: Feet supported Sitting balance-Leahy Scale: Zero Sitting balance - Comments: Total A to maintain static sitting balance secondary to lethargy/fatigue.     Standing balance-Leahy Scale: Zero Standing balance comment: Unable this date secondary to increased lethargy.                           ADL either performed or assessed with clinical judgement   ADL                                         General ADL Comments: Dependent     Vision       Perception     Praxis      Cognition Arousal/Alertness: Awake/alert Behavior During Therapy: Flat affect Overall Cognitive Status: Impaired/Different from baseline Area of Impairment: Attention;Memory;Following commands;Safety/judgement;Awareness;Problem solving                   Current Attention Level: Focused Memory: Decreased short-term memory Following Commands: Follows one step commands with increased time;Follows one step commands inconsistently Safety/Judgement: Decreased awareness of deficits   Problem Solving: Slow processing;Decreased initiation;Difficulty sequencing;Requires verbal cues;Requires tactile cues General Comments: Patient very lethargic this date. No following any commands despite Max multimodal cueing.        Exercises     Shoulder Instructions       General Comments Patient more lethargic this date. Responds intermittently to name being called. Keeps eyes closed for majority of session even with sitting at EOB.  Patient is not making progress toward goals.    Pertinent Vitals/ Pain       Pain Assessment: Faces Faces Pain Scale: No hurt Pain Intervention(s): Monitored during session  Home Living                                          Prior Functioning/Environment              Frequency  Min 1X/week        Progress Toward Goals  OT Goals(current goals can now be found in the  care plan section)  Progress towards OT goals: Not progressing toward goals - comment (Limited by lethargy and further decreased command following this date.)  Acute Rehab OT Goals Patient Stated Goal: None stated. OT Goal Formulation: Patient unable to participate in goal setting Time For Goal Achievement: 03/14/21 Potential to Achieve Goals: Poor ADL Goals Pt Will Perform Eating: with set-up;sitting Pt Will Perform Grooming: with supervision;sitting Pt Will Perform Upper Body Dressing: with min assist;sitting Pt Will Perform Lower Body Dressing: with mod assist;sit to/from stand Pt Will Transfer to Toilet: with mod assist;bedside commode Pt Will Perform Toileting - Clothing Manipulation and hygiene: with mod assist;sit to/from stand;sitting/lateral leans Pt/caregiver will Perform Home Exercise Program: Increased strength;Both right and left upper extremity;With theraband;With written HEP provided Additional ADL Goal #1: Patient will complete 50% of meal with Mod A and Max multimodal cues. Additional ADL Goal #2: Patient will follow 1-step verbal commands with 50% accuracy in prep for ADLs. Additional ADL Goal #3: Patient will complete 3/3 grooming tasks in supported sitting position with Mod A and Max multimodal cues.  Plan Discharge plan remains appropriate;Frequency needs to be updated    Co-evaluation                 AM-PAC OT "6 Clicks" Daily Activity     Outcome Measure   Help from another person eating meals?: Total Help from another person taking care of personal grooming?: Total Help from another person toileting, which includes using toliet, bedpan, or urinal?: Total Help from another person bathing (including washing, rinsing, drying)?: Total Help from another person to put on and taking off regular upper body clothing?: Total Help from another person to put on and taking off regular lower body clothing?: Total 6 Click Score: 6    End of Session Equipment  Utilized During Treatment: Gait belt  OT Visit Diagnosis: Unsteadiness on feet (R26.81);Other abnormalities of gait and mobility (R26.89);Muscle weakness (generalized) (M62.81);Other symptoms and signs involving cognitive function;Other symptoms and signs involving the nervous system (R29.898)   Activity Tolerance Patient limited by lethargy   Patient Left in bed;with call bell/phone within reach;with bed alarm set;with nursing/sitter in room;Other (comment)   Nurse Communication Other (comment) (Response to treatment.)        Time: 6168-3729 OT Time Calculation (min): 21 min  Charges: OT General Charges $OT Visit: 1 Visit OT Treatments $Therapeutic Activity: 8-22 mins  Angelice Piech H. OTR/L Supplemental OT, Department of rehab services [phone]   Juliya Magill R H. 03/11/2021, 11:13 AM

## 2021-03-11 NOTE — TOC Progression Note (Addendum)
Transition of Care [facility]) - Progression Note    Patient Details  Name: Carolyn Norton MRN: [id number] Date of Birth: 12/22/1965  Transition of Care [facility]) CM/SW Contact  Janae Bridgeman, RN Phone Number: 03/11/2021, 10:48 AM  Clinical Narrative:    CM called and left a message with [name], admissions supervisor with [facility] and asked if she had facilities in the area that would offer a bed to the patient for admission while her Medicaid and disability are pending.  The patient currently has no bed offers at this time.    Message left with Malena Peer, CM at [facility] to explore admission option at the facility.  Clinicals were resent to the facility in the hub.  The patient is currently being followed by Palliative Care and is not eating well at this time by mouth.  CM and MSW with DTP Team will continue to explore options for SNF placement for LTC placement.  The patient's family is unable / unwilling to assume care of the patient.  03/11/2021 1206 - I spoke with Elita Quick, CM at [facility] in [place], [place]  [phone]- and the facility has available bed open.  Clinicals were resent to the facility for DON at the facility to review.  The facility is aware that the patient is unvaccinated for COVID.  CM and MSW will continue to follow for SNF placement.   Expected Discharge Plan: Skilled Nursing Facility Barriers to Discharge: Family Issues, Continued Medical Work up  Expected Discharge Plan and Services Expected Discharge Plan: Skilled Nursing Facility In-house Referral: Clinical Social Work Discharge Planning Services: CM Consult Post Acute Care Choice: Skilled Nursing Facility Living arrangements for the past 2 months: Single Family Home Expected Discharge Date: 01/16/21               DME Arranged: [name] rolling DME Agency: [employer] Date DME Agency Contacted: 01/20/21 Time DME Agency Contacted: 1435 Representative spoke with at  DME Agency: [name]             Social Determinants of Health (SDOH) Interventions    Readmission Risk Interventions Readmission Risk Prevention Plan 01/14/2021  Transportation Screening Complete  PCP or Specialist Appt within 3-5 Days (No Data)  Social Work Consult for Recovery Care Planning/Counseling Complete  Palliative Care Screening Not Applicable  Medication Review [facility]) Referral to Pharmacy  Some recent data might be hidden

## 2021-03-11 NOTE — Progress Notes (Addendum)
[facility] PROGRESS NOTE  Carolyn Norton WPY:[id number] DOB: [birth year] DOA: 01/11/2021 PCP: [name], FNP     Status: Remains inpatient appropriate because:Unsafe d/c plan-due to patient's underlying poor mental status short-term memory deficits she will require 24/7 care after discharge  Dispo:  Patient From:  Home  Planned Disposition: SNF  Medically stable for discharge:  yes  Barriers to DC: Continues to require significant assistance with mobility.  Continues to have significant short-term memory deficits impeding patient's ability to safely return home with elderly parents; please note that the patient's father continues to work outside of the home therefore the burden of care would follow-up on the patient's elderly mother             Difficult to place: Yes  Level of care: Med-Surg  Code Status: DNR Family Communication: Father 7/1 via VM DVT prophylaxis: Lovenox COVID vaccination status: Unknown-post COVID infection 09/06/20   HPI: 55 year old female past medical history of diabetes mellitus type 2, bipolar disorder type I, previous COVID infection, polysubstance abuse that includes cocaine.  She presented from home where she lives with her parents.  Her presenting symptoms included generalized weakness, multiple falls and confusion.  Family confirmed that she had been having the symptoms for at least a month with last use of crack cocaine 1 month prior to presentation.  Ever since that time she had not been doing well.  She would also be having issues with hyperglycemia.  In the ER she was diagnosed with severe dehydration, hyperglycemia and metabolic encephalopathy.  Since admission patient has been evaluated by the psychiatric team who has deemed her alert and oriented and having capacity to make decisions.  Patient is agreeable to placement at a rehab.  Currently she has severe physical deconditioning which was POA and PT is recommending SNF.  Because of  her history of polysubstance abuse and lack of funding SNF is not an option.  Family is agreeable to taking her back in home when she is able to ambulate well enough to get back and forth for meals into the bathroom.  Subjective: Sleeping upon my entry into the room.  Did not awaken to voice but awakened to both tactile stimulation with voice.  Very pleasant and made eye contact.  Weakly follows simple commands by squeezing hand.  Unable to lift legs upon command but did wiggle toes.  Still continues to report no appetite.   Objective: Vitals:   03/10/21 2100 03/11/21 0420  BP: (!) 147/84 (!) 163/87  Pulse: 81 95  Resp: 16 14  Temp: 97.9 F (36.6 C) 97.8 F (36.6 C)  SpO2: 98% 97%    Intake/Output Summary (Last 24 hours) at 03/11/2021 0539 Last data filed at 03/10/2021 1500 Gross per 24 hour  Intake 200 ml  Output 1000 ml  Net -800 ml   Filed Weights   01/11/21 1152  Weight: 99.8 kg    Exam:  Constitutional: Awakens, pleasant and in no acute distress Respiratory: Lung sounds remain clear, she is stable on room air and there is no increased work of breathing Cardiac: S1-S2, intermittently hypertensive, no peripheral edema, regular pulse Abdomen: LBM 6/27, soft and nontender with normoactive bowel sounds.  Poor oral intake documented. Neurologic: Cranial nerves are grossly intact.  Today she was unable to lift upper extremities on command but did weakly grip with right hand on command grip strength 2/5.  She was also unable to lift legs on command but did wiggle both toes when asked to  lift leg. Psychiatric: Patient awaken to combination of voice and tactile stimulation.  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  Initially felt to be partially due to NPH physiology given after initial large-volume LP procedure she did have some improvement in mobility and cognition.  Patient had another significant decline in mentation that was later deemed secondary to acute UTI noting her mentation rapidly improved after administration of antibiotics. Her went MRI of her spine without any abnormalities to explain her progressive lower extremity weakness. After multiple discussions with the neurology team it was felt that patient likely has an underlying dementia type process related to prior significant drug use in combination with her known bipolar illness.  Recommendation is to continue current DNR status but patient has improved to the point where comfort care is no longer indicated although after discussions with the palliative medicine team throughout the hospitalization family continues to decline PEG tube Poor oral intake-SLP reevaluated and she is currently on a D2 diet Mental status waxes and wanes regarding alertness.  Continues with poor oral intake but Megace recently resumed. If current status does not improve may need to revisit discussions with family regarding comfort measures.  Profound physical deconditioning/critical illness myopathy Likely a combination of persistent altered mental status and inability to participate consistently with PT as well as myopathy of critical illness  Ventriculomegaly/? NPH NPH ruled out as above  Acute hypoxemia/COPD Resolved and felt to be secondary to hypoventilation from altered mentation  Dehydration with acute kidney injury/hyperkalemia Peak creatinine 1.38 on 6/27.  This in context of hyperglycemia 513 Creatinine corrected after hydration IV fluids have been discontinued.  We will no longer utilize IV fluids because she is not eating and drinking.  We need to obtain a realistic picture  of her overall oral intake and hydration status without IV fluid supplementation  Anorexia/Odynophagia 7/4 resume Megace and continue DVT prophylaxis as long as this medication is in place Family confirmed with palliative medicine that they do not wish for PEG tube placement Given the underlying etiology to her poor oral intake short-term utilization of core track for feeding also not appropriate.  E. Coli [name]/Klebsiella UTI Treated Use pure wick only at bedtime  Diabetes mellitus 2 with hyperglycemia on insulin Hemoglobin A1c 11.0 Change Lantus to 5 units HS Continue every 4 hours SSI    Dyslipidemia Statin on hold while n.p.o.  History of bipolar 1 disorder/polysubstance abuse/cognitive impairment Continue to hold escitalopram and Neurontin. PO Keppra dc'd on 6/29 and changed to IV due to AMS/not safe for PO. Pharmacy substituted IV Keppra for Depakote since no IV Depakene available Will decrease PO Keppra dose to 250 mg BID x 3 days then dc. Could be affecting mentation.  Will re order Levetiracetram level Functional cognitive evaluation with significant abnormalities  Physical deconditioning/bilateral heel cord shortening Continue bilateral PRAFO boots on every 4 hours and off every 4 hours    Other problems: GERD Oral PPI on hold while n.p.o.  Hypokalemia/hypomagnesemia Resolved  Data Reviewed: Basic Metabolic Panel: Recent Labs  Lab 03/04/21 0844 03/05/21 0856 03/06/21 0412 03/10/21 0357  NA 134* 136 140 139  K 3.7 4.0 3.8 3.3*  CL 94* 99 104 105  CO2 27 28 30 28   GLUCOSE 348*  299* 167* 148*  BUN 19 14 7 6   CREATININE 1.17* 1.01* 0.86 0.81  CALCIUM 9.1 9.2 9.3 9.1   Liver Function Tests: Recent Labs  Lab 03/05/21 0856  AST 14*  ALT 11  ALKPHOS 42  BILITOT 0.4  PROT 6.1*  ALBUMIN 2.7*   No results for input(s): LIPASE, AMYLASE in the last 168 hours. No results for input(s): AMMONIA in the last 168 hours. CBC: Recent Labs  Lab 03/04/21 0844  03/10/21 0357  WBC 8.4 9.2  NEUTROABS 4.6  --   HGB 12.6 12.5  HCT 38.1 37.8  MCV 87.8 88.3  PLT 210 333   Cardiac Enzymes: No results for input(s): CKTOTAL, CKMB, CKMBINDEX, TROPONINI in the last 168 hours. BNP (last 3 results) Recent Labs    01/14/21 0238 01/15/21 0230 01/16/21 0201  BNP 177.8* 115.9* 40.9    ProBNP (last 3 results) No results for input(s): PROBNP in the last 8760 hours.  CBG: Recent Labs  Lab 03/10/21 1112 03/10/21 1529 03/10/21 1730 03/10/21 2101 03/11/21 0718  GLUCAP 272* 139* 79 214* 250*    Recent Results (from the past 240 hour(s))  Culture, Urine     Status: Abnormal   Collection Time: 03/03/21  8:15 AM   Specimen: Urine, Catheterized  Result Value Ref Range Status   Specimen Description URINE, CATHETERIZED  Final   Special Requests   Final    NONE Performed at [facility], [address] [place], [place]    Culture (A)  Final    60,000 COLONIES/mL ESCHERICHIA COLI 10,000 COLONIES/mL KLEBSIELLA PNEUMONIAE    Report Status 03/05/2021 FINAL  Final   Organism ID, Bacteria ESCHERICHIA COLI (A)  Final   Organism ID, Bacteria KLEBSIELLA PNEUMONIAE (A)  Final      Susceptibility   Escherichia coli - MIC*    AMPICILLIN >=32 RESISTANT Resistant     CEFAZOLIN <=4 SENSITIVE Sensitive     CEFEPIME <=0.12 SENSITIVE Sensitive     CEFTRIAXONE <=0.25 SENSITIVE Sensitive     CIPROFLOXACIN >=4 RESISTANT Resistant     GENTAMICIN >=16 RESISTANT Resistant     IMIPENEM <=0.25 SENSITIVE Sensitive     NITROFURANTOIN <=16 SENSITIVE Sensitive     TRIMETH/SULFA <=20 SENSITIVE Sensitive     AMPICILLIN/SULBACTAM 16 INTERMEDIATE Intermediate     PIP/TAZO <=4 SENSITIVE Sensitive     * 60,000 COLONIES/mL ESCHERICHIA COLI   Klebsiella pneumoniae - MIC*    AMPICILLIN RESISTANT Resistant     CEFAZOLIN <=4 SENSITIVE Sensitive     CEFEPIME <=0.12 SENSITIVE Sensitive     CEFTRIAXONE <=0.25 SENSITIVE Sensitive     CIPROFLOXACIN <=0.25  SENSITIVE Sensitive     GENTAMICIN <=1 SENSITIVE Sensitive     IMIPENEM <=0.25 SENSITIVE Sensitive     NITROFURANTOIN 64 INTERMEDIATE Intermediate     TRIMETH/SULFA <=20 SENSITIVE Sensitive     AMPICILLIN/SULBACTAM <=2 SENSITIVE Sensitive     PIP/TAZO <=4 SENSITIVE Sensitive     * 10,000 COLONIES/mL KLEBSIELLA PNEUMONIAE  Culture, blood (Routine X 2) w Reflex to ID Panel     Status: None   Collection Time: 03/05/21  1:06 PM   Specimen: BLOOD LEFT HAND  Result Value Ref Range Status   Specimen Description BLOOD LEFT HAND  Final   Special Requests   Final    BOTTLES DRAWN AEROBIC AND ANAEROBIC Blood Culture adequate volume   Culture   Final    NO GROWTH 5 DAYS Performed at [facility], [address] [place], [place] [address]    Report Status 03/10/2021 FINAL  Final  Culture, blood (Routine X 2) w Reflex to ID Panel     Status: None   Collection Time: 03/05/21  1:14 PM   Specimen: BLOOD RIGHT HAND  Result Value Ref Range Status   Specimen Description BLOOD RIGHT HAND  Final   Special Requests   Final    BOTTLES DRAWN AEROBIC AND ANAEROBIC Blood Culture adequate volume   Culture   Final    NO GROWTH 5 DAYS Performed at [facility], [address], [place], [place] [address]    Report Status 03/10/2021 FINAL  Final  CSF culture w Gram Stain     Status: None   Collection Time: 03/06/21 10:23 AM   Specimen: PATH Cytology CSF; Cerebrospinal Fluid  Result Value Ref Range Status   Specimen Description CSF  Final   Special Requests NONE  Final   Gram Stain   Final    WBC PRESENT, PREDOMINANTLY MONONUCLEAR NO ORGANISMS SEEN CYTOSPIN SMEAR    Culture   Final    NO GROWTH 3 DAYS Performed at [facility], [address], [place], [place] [address]    Report Status 03/10/2021 FINAL  Final      Studies: No results found.  Scheduled Meds:  enoxaparin (LOVENOX) injection  40 mg Subcutaneous Q24H   feeding supplement  237 mL Oral BID BM   insulin aspart   0-20 Units Subcutaneous TID AC & HS   insulin glargine  5 Units Subcutaneous QHS   levETIRAcetam  500 mg Oral BID   magic mouthwash w/lidocaine  10 mL Oral QID   megestrol  800 mg Oral Daily   Continuous Infusions:     Principal Problem:   Bipolar 1 disorder, depressed, full remission (HCC) Active Problems:   Bipolar 1 disorder (HCC)   Hypertension   Generalized weakness   Controlled type 2 diabetes mellitus with hyperglycemia, with long-term current use of insulin (HCC)   Hyperlipidemia   NPH (normal pressure hydrocephalus) (HCC)   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   Consultants: Psychiatry Neurology Neurosurgery  Procedures: None  Antibiotics: Anti-infectives (From admission, onward)    Start     Dose/Rate Route Frequency Ordered Stop   03/06/21 0900  ceFAZolin (ANCEF) IVPB 1 g/50 mL premix        1 g 100 mL/hr over 30 Minutes Intravenous Every 8 hours 03/06/21 0801 03/10/21 0559   03/04/21 1430  cefTRIAXone (ROCEPHIN) 1 g in sodium chloride 0.9 % 100 mL IVPB  Status:  Discontinued        1 g 200 mL/hr over 30 Minutes Intravenous Every 24 hours 03/04/21 1335 03/06/21 0800   02/14/21 0828  vancomycin (VANCOREADY) IVPB 1000 mg/200 mL  Status:  Discontinued        1,000 mg 200 mL/hr over 60 Minutes Intravenous 60 min pre-op 02/14/21 0828 03/02/21 1139   02/13/21 2115  cefTRIAXone (ROCEPHIN) 1 g in sodium chloride 0.9 % 100 mL IVPB  Status:  Discontinued        1 g 200 mL/hr over 30 Minutes Intravenous Every 24 hours 02/13/21 2016 02/16/21 0844        Time spent: 25 minutes    [name] ANP  [facility] 7 am - 330 pm/M-F for direct patient care and secure chat Please refer to Amion for contact info 58  days

## 2021-03-11 NOTE — Progress Notes (Signed)
Physical Therapy Treatment Patient Details Name: Carolyn Norton MRN: [id number] DOB: 03/03/1966 Today's Date: 03/11/2021    History of Present Illness Pt is a 55 y.o. female admitted 01/11/21 with multiple falls, AMS, weakness. Workup for severe dehydration, DKA, metabolic encephalopathy. 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 Neurosurgery following for possible lumbar drain trial vs repeat LP vs VP shunt placement. Placement of lumbar drain on 6/10 cancelled due to UTI.7/1 underwent lumbar puncture and drain large volume of CSF to assess for potential improvement in symptomology (none noted);  7/2 MRI of the brain, cervical, thoracic and lumbar spine with no acute changes to explain LE weakness   PMH includes DM2, COPD, CKD3, GERD, bipolar disorder, COVID-19, polysubstance abuse including cocaine.    PT Comments    Patient wide awake upon PT arrival (noted she was very lethargic with OT earlier today). She was able to sit EOB and progress to minguard assist for balance. She was then able to stand with stedy (+2 max assist--more than she needed before recent period of decr level of consciousness, but improved from recent sessions). Goals updated for an additional one week period to assess her ability to make progress (and even maintain level of consciousness to participate).     Follow Up Recommendations  SNF (vs LTC as limited progress with therapies)     Equipment Recommendations  Rolling walker with 5" wheels;3in1 (PT);Hospital bed;Wheelchair (measurements PT);Other (comment) (hoyer lift; *reassess after lumbar drain)    Recommendations for Other Services       Precautions / Restrictions Precautions Precautions: Fall Restrictions Weight Bearing Restrictions: No    Mobility  Bed Mobility Overal bed mobility: Needs Assistance Bed Mobility: Rolling;Sidelying to Sit;Sit to Sidelying Rolling: Max assist;+2 for physical assistance Sidelying to sit: HOB  elevated;Total assist     Sit to sidelying: Max assist;+2 for physical assistance General bed mobility comments: Max A for all parts of bed mobility. Patient given 1-step verbal commands but after >15 seconds, patient still not performing task (i.e. turning head or reaching for rail).    Transfers Overall transfer level: Needs assistance   Transfers: Sit to/from Stand Sit to Stand: Max assist;+2 physical assistance;From elevated surface         General transfer comment: unable to attempt  Ambulation/Gait                 Stairs             Wheelchair Mobility    Modified Rankin (Stroke Patients Only)       Balance Overall balance assessment: Needs assistance;History of Falls Sitting-balance support: Feet supported Sitting balance-Leahy Scale: Poor Sitting balance - Comments: pt does not use UE's for support; performed trunk rotation and flexion stretches and pt then able to maintain midline with minguard assist (and able to place stedy in front of pt)     Standing balance-Leahy Scale: Zero                              Cognition Arousal/Alertness: Awake/alert Behavior During Therapy: Flat affect Overall Cognitive Status: Difficult to assess Area of Impairment: Attention;Memory;Following commands;Safety/judgement;Awareness;Problem solving                 Orientation Level:  (NT) Current Attention Level: Focused Memory: Decreased short-term memory Following Commands: Follows one step commands inconsistently;Follows one step commands with increased time Safety/Judgement: Decreased awareness of deficits   Problem Solving: Slow  processing;Difficulty sequencing;Requires verbal cues;Decreased initiation;Requires tactile cues General Comments: more lethargic (although eyes open entire session and will respond "hey" when name called); required hand-over-hand guidance for following commands      Exercises      General Comments General  comments (skin integrity, edema, etc.): Patient unfortunately has not made progress with PT since last goal update. Her ability to participate can fluctuate (periods of lethargy), therefore feel an additional 1 weeks to assess ability to progress should be extended to pt.      Pertinent Vitals/Pain Pain Assessment: Faces Faces Pain Scale: No hurt Pain Intervention(s): Monitored during session    Home Living                      Prior Function            PT Goals (current goals can now be found in the care plan section) Acute Rehab PT Goals Patient Stated Goal: None stated. PT Goal Formulation: Patient unable to participate in goal setting Time For Goal Achievement: 03/17/21 Potential to Achieve Goals: Fair Progress towards PT goals: Not progressing toward goals - comment (has had period of lethargy and now waking up)    Frequency    Min 2X/week      PT Plan Current plan remains appropriate    Co-evaluation              AM-PAC PT "6 Clicks" Mobility   Outcome Measure  Help needed turning from your back to your side while in a flat bed without using bedrails?: Total Help needed moving from lying on your back to sitting on the side of a flat bed without using bedrails?: Total Help needed moving to and from a bed to a chair (including a wheelchair)?: Total Help needed standing up from a chair using your arms (e.g., wheelchair or bedside chair)?: Total Help needed to walk in hospital room?: Total Help needed climbing 3-5 steps with a railing? : Total 6 Click Score: 6    End of Session Equipment Utilized During Treatment: Gait belt Activity Tolerance: Patient limited by lethargy Patient left: with call bell/phone within reach;in bed;with bed alarm set Nurse Communication: Other (comment) (needs purewick replaced) PT Visit Diagnosis: Muscle weakness (generalized) (M62.81);Repeated falls (R29.6);Difficulty in walking, not elsewhere classified  (R26.2);Unsteadiness on feet (R26.81)     Time: 8413-2440 PT Time Calculation (min) (ACUTE ONLY): 26 min  Charges:  $Therapeutic Activity: 23-37 mins                      Jerolyn Center, PT Pager [phone]    Zena Amos 03/11/2021, 1:12 PM

## 2021-03-12 LAB — GLUCOSE, CAPILLARY
Glucose-Capillary: 342 mg/dL — ABNORMAL HIGH (ref 70–99)
Glucose-Capillary: 327 mg/dL — ABNORMAL HIGH (ref 70–99)
Glucose-Capillary: 197 mg/dL — ABNORMAL HIGH (ref 70–99)
Glucose-Capillary: 261 mg/dL — ABNORMAL HIGH (ref 70–99)

## 2021-03-12 MED ORDER — INSULIN GLARGINE 100 UNIT/ML ~~LOC~~ SOLN
10.0000 [IU] | Freq: Every day | SUBCUTANEOUS | Status: DC
  Administered 2021-03-12: 10 [IU] via SUBCUTANEOUS
  Filled 2021-03-12 (×2): qty 0.1

## 2021-03-12 MED ORDER — ENSURE ENLIVE PO LIQD
237.0000 mL | Freq: Three times a day (TID) | ORAL | Status: DC
  Administered 2021-03-12: 237 mL via ORAL

## 2021-03-12 MED ORDER — ADULT MULTIVITAMIN W/MINERALS CH
1.0000 | ORAL_TABLET | Freq: Every day | ORAL | Status: DC
  Administered 2021-03-12 – 2021-03-24 (×12): 1 via ORAL
  Filled 2021-03-12 (×12): qty 1

## 2021-03-12 NOTE — Progress Notes (Signed)
Occupational Therapy Treatment Patient Details Name: Carolyn Norton MRN: [id number] DOB: 1965-10-24 Today's Date: 03/12/2021    History of present illness Pt is a 55 y.o. female admitted 01/11/21 with multiple falls, AMS, weakness. Workup for severe dehydration, DKA, metabolic encephalopathy. 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 Neurosurgery following for possible lumbar drain trial vs repeat LP vs VP shunt placement. Placement of lumbar drain on 6/10 cancelled due to UTI.7/1 underwent lumbar puncture and drain large volume of CSF to assess for potential improvement in symptomology (none noted);  7/2 MRI of the brain, cervical, thoracic and lumbar spine with no acute changes to explain LE weakness   PMH includes DM2, COPD, CKD3, GERD, bipolar disorder, COVID-19, polysubstance abuse including cocaine.   OT comments  Attempted to engage pt in self care tasks at bed level. Pt requires max to total hand over hand assist to use utensils with BUE . It was noted in session they were able to itch their nose twice with L hand but when attempting to use for self feeding and oral care they presented with very ridge movements. Pt noted lateral lean to L side and required total assist to reposition.  Pt currently with functional limitations due to the deficits listed below (see OT Problem List).  Pt will benefit from skilled acute OT to increase their safety and independence with ADL and functional mobility for ADL to facilitate discharge to venue listed below.    Follow Up Recommendations  SNF;Supervision/Assistance - 24 hour    Equipment Recommendations  None recommended by OT    Recommendations for Other Services      Precautions / Restrictions Precautions Precautions: Fall Restrictions Weight Bearing Restrictions: No       Mobility Bed Mobility                    Transfers                 General transfer comment: unable to attempt    Balance                                            ADL either performed or assessed with clinical judgement   ADL Overall ADL's : Needs assistance/impaired Eating/Feeding: Maximal assistance;Total assistance;Bed level   Grooming: Total assistance;Bed level                                 General ADL Comments: Dependent     Vision       Perception     Praxis      Cognition Arousal/Alertness: Awake/alert Behavior During Therapy: Flat affect Overall Cognitive Status: Difficult to assess Area of Impairment: Attention;Memory;Following commands;Safety/judgement;Awareness;Problem solving                   Current Attention Level: Focused Memory: Decreased short-term memory Following Commands: Follows one step commands inconsistently;Follows one step commands with increased time Safety/Judgement: Decreased awareness of deficits Awareness: Intellectual Problem Solving: Slow processing;Difficulty sequencing;Requires verbal cues;Decreased initiation;Requires tactile cues          Exercises     Shoulder Instructions       General Comments Pt noted needed increase respone time but also max to ttal hand over hand assistance    Pertinent Vitals/ Pain  Pain Assessment: Faces Faces Pain Scale: No hurt Pain Intervention(s): Monitored during session  Home Living                                          Prior Functioning/Environment              Frequency  Min 1X/week        Progress Toward Goals  OT Goals(current goals can now be found in the care plan section)  Progress towards OT goals: Not progressing toward goals - comment (today pt was very lethargic)  Acute Rehab OT Goals Patient Stated Goal: None stated. OT Goal Formulation: Patient unable to participate in goal setting Time For Goal Achievement: 03/22/21 Potential to Achieve Goals: Poor ADL Goals Pt Will Perform Eating: with set-up;sitting Pt Will  Perform Grooming: with supervision;sitting Pt Will Perform Upper Body Dressing: with min assist;sitting Pt Will Perform Lower Body Dressing: with mod assist;sit to/from stand Pt Will Transfer to Toilet: with mod assist;bedside commode Pt Will Perform Toileting - Clothing Manipulation and hygiene: with mod assist;sit to/from stand;sitting/lateral leans Pt/caregiver will Perform Home Exercise Program: Increased strength;Both right and left upper extremity;With theraband;With written HEP provided Additional ADL Goal #1: Patient will complete 50% of meal with Mod A and Max multimodal cues. Additional ADL Goal #2: Patient will follow 1-step verbal commands with 50% accuracy in prep for ADLs. Additional ADL Goal #3: Patient will complete 3/3 grooming tasks in supported sitting position with Mod A and Max multimodal cues.  Plan Discharge plan remains appropriate;Frequency needs to be updated    Co-evaluation                 AM-PAC OT "6 Clicks" Daily Activity     Outcome Measure   Help from another person eating meals?: Total Help from another person taking care of personal grooming?: Total Help from another person toileting, which includes using toliet, bedpan, or urinal?: Total Help from another person bathing (including washing, rinsing, drying)?: Total Help from another person to put on and taking off regular upper body clothing?: Total Help from another person to put on and taking off regular lower body clothing?: Total 6 Click Score: 6    End of Session    OT Visit Diagnosis: Unsteadiness on feet (R26.81);Other abnormalities of gait and mobility (R26.89);Muscle weakness (generalized) (M62.81);Other symptoms and signs involving cognitive function;Other symptoms and signs involving the nervous system (R29.898)   Activity Tolerance Patient limited by lethargy   Patient Left in bed;with bed alarm set;with call bell/phone within reach   Nurse Communication Other (comment) (ability  to participate)        Time: 1012-1038 OT Time Calculation (min): 26 min  Charges: OT General Charges $OT Visit: 1 Visit OT Treatments $Self Care/Home Management : 23-37 mins  Alphia Moh OTR/L  Acute Rehab Services  [phone] office number [phone] pager number    Alphia Moh 03/12/2021, 10:57 AM

## 2021-03-12 NOTE — Progress Notes (Signed)
CSW spoke with Grace at [facility] who is agreeable to review referral.  Chasmine Lender, MSW, LCSW Transitions of Care  Clinical Social Worker II [phone]  

## 2021-03-12 NOTE — Progress Notes (Signed)
Initial Nutrition Assessment  DOCUMENTATION CODES:  Obesity unspecified  INTERVENTION:  Begin 72-hour calorie count.  Increase Ensure Enlive from BID to TID.  Add Vital Cuisine Shake BID, each supplement provides 520 kcal and 22 grams of protein.  Add MVI with minerals daily.  Continue Magic Cup TID.  Assist feeding patient for meals and supplements.  Obtain updated weight.  NUTRITION DIAGNOSIS:  Inadequate oral intake related to lethargy/confusion as evidenced by per patient/family report, meal completion < 50%.  GOAL:  Patient will meet greater than or equal to 90% of their needs  MONITOR:  PO intake, Supplement acceptance, Diet advancement, Labs, Weight trends, I & O's  REASON FOR ASSESSMENT:  Consult Calorie Count, Poor PO  ASSESSMENT:  55 year old female past medical history of diabetes mellitus type 2, bipolar disorder type I, previous COVID infection, polysubstance abuse that includes cocaine.  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 Pt has been at [facility] for the past fifty-three days is not eating or drinking sufficiently. Palliative care has been asked to get re-involved to further discuss goals of care in the setting of progressive failure to thrive and generalized weakness.  Per Palliative Care note," I met with Sharyn Lull at bedside this morning. She was alert to self . She denied and pain or nausea this morning. She expresses feeling "alright". I asked her is she has any concerns this morning, she took some time to respond to me then shared that she does not. I called patients father, Shelly Flatten this morning. [name] and I reviewed that he plans to meet with the MSW team tomorrow to further discuss medicaid and disability services. He shares that they have been waiting "a long time" and that "people are dragging their feet". Reviewed Blinda's nutritional intake. I shared that when I spoke to Marueno when she was  more clear minded she shared not wanting a long term feeding tube. I asked Shelly Flatten if he feels at this point given her variable PO intake that a feeding tube in her nose to her stomach for a trial period would be a consideration. Shelly Flatten shares that he would wish for this if it came down to it. He wants to "give her a chance" to make improvements. I shared that right now this is not going to be pursued though if needed it is reasonable to consider a 3-7 day trial period. Endorsed that if no improvements are made after that time if would be time to further consider hospice care. Again reviewed Muriah's tumultuous hospitalization, co-morbidities, and the reality that no matter what we do here we are not seeing great improvements. [name] expressed understanding."  RD to start calorie count for the next 3 days.  Pt with limited intake with limited documentation. Last 8 meals range from 3-50% intake.  Pt may benefit from 1:1 assistance with feeding during meals. RD to order.  Consider adding bowel regimen given that pt has not had a bowel movement in 9 days.  Pt overdue for new weight. Most recent weight was admit weight taken 01/13/21. Please obtain new weight.  Medications: reviewed; EE BID, SSI, bedtime Lantus, Keppra BID, Megace  Labs: reviewed; CBG [id number]  Diet Order:   Diet Order             DIET DYS 2 Room service appropriate? No; Fluid consistency: Thin  Diet effective now                  EDUCATION NEEDS:  Education needs have  been addressed  Skin:  Skin Assessment: Reviewed RN Assessment  Last BM:  03/03/21  Height:  Ht Readings from Last 1 Encounters:  01/11/21 5' 6"  (1.676 m)   Weight:  Wt Readings from Last 1 Encounters:  01/11/21 99.8 kg   Ideal Body Weight:  59.1 kg  BMI:  Body mass index is 35.51 kg/m.  Estimated Nutritional Needs:  Kcal:  1650-1850 Protein:  105-120 grams Fluid:  >1.65 L  Derrel Nip, RD, LDN (she/her/hers) Registered Dietitian  I After-Hours/Weekend Pager # in [place]

## 2021-03-12 NOTE — Progress Notes (Signed)
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 Still unsure if NPH primary issues or other underlying problem due to h/o drug abuse/bipolar. I plan to update her family later today. RN said they were here yesterday- if this is irreversible she shouldn't get a PEG and should be comfort in my opinion"   HPI:  Per intake H&P --> 55 year old female past medical history of diabetes mellitus type 2, bipolar disorder type I, previous COVID infection, polysubstance abuse that includes cocaine.  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   Carolyn Norton has been at [facility] for the past fifty-three days is not eating or drinking sufficiently.  Palliative care has been asked to get re-involved to further discuss goals of care in the setting of progressive failure to thrive and generalized weakness.  Today's Discussion (03/12/2021): Chart reviewed.  I met with Carolyn Norton at bedside this morning. She was alert to self . She denied and pain or nausea this morning. She expresses feeling "alright". I asked her is she has any concerns this morning, she took some time to respond to me then shared that she does not.   I called patients father, Carolyn Norton this morning. Carolyn Norton and I reviewed that he plans to meet with the MSW team tomorrow to further discuss medicaid and disability services. He shares that they have been waiting "a long time" and that "people are dragging their feet". Reviewed Carolyn Norton's nutritional intake. I shared that when I spoke to Carolyn Norton when she was more clear minded she shared not wanting a long term feeding tube. I asked Carolyn Norton if he feels at this point  given her variable PO intake that a feeding tube in her nose to her stomach for a trial period would be a consideration. Carolyn Norton shares that he would wish for this if it came down to it. He wants to "give her a chance" to make improvements. I shared that right now this is not going to be pursued though if needed it is reasonable to consider a 3-7 day trial period. Endorsed that if no improvements are made after that time if would be time to further consider hospice care. Again reviewed Carolyn Norton's tumultuous hospitalization, co-morbidities, and the reality that no matter what we do here we are not seeing great improvements. Carolyn Norton expressed understanding.  Carolyn Norton shares with me that he does plan on brining the HCPOA documents in tomorrow.   Questions and concerns addressed   Objective Assessment: Vital Signs Vitals:   03/11/21 1924 03/12/21 0441  BP: 140/78 (!) 165/84  Pulse: 89 93  Resp: 20 18  Temp: 98 F (36.7 C) 98.9 F (37.2 C)  SpO2: 96% 96%    Intake/Output Summary (Last 24 hours) at 03/12/2021 1013 Last data filed at 03/12/2021 0800 Gross per 24 hour  Intake 100 ml  Output --  Net 100 ml    Last Weight  Most recent update: 01/11/2021 11:52 AM    Weight  99.8 kg (220 lb)            Gen:  Caucasian F in NAD HEENT: Dry  mucous membranes CV: Regular rate and rhythm PULM: On 2LPM [place] ABD: soft/nontender EXT: No edema Neuro:  Responds to name  SUMMARY OF RECOMMENDATIONS   DNAR/DNI  Patients father open to 3-7 day trial of NGT feedings if needed, we will first proceed with a calorie count  Family plans to bring [facility] documentation in tomorrow  Goals are for improvement as of presently, if Carolyn Norton declines and can no longer take in PO's we will reconvene  Appreciate SW helping patients father navigate Disability & Medicaid application - Carolyn Norton has a meeting tomorrow for further communication regarding her eligibility    Ongoing incremental palliative care  support  Time Spent : 17 Greater than 50% of the time was spent in counseling and coordination of care ______________________________________________________________________________________ [place] Team Team Cell Phone: [phone] Please utilize secure chat with additional questions, if there is no response within 30 minutes please call the above phone number  Palliative Medicine Team providers are available by phone from 7am to 7pm daily and can be reached through the team cell phone.  Should this patient require assistance outside of these hours, please call the patient's attending physician.

## 2021-03-12 NOTE — Progress Notes (Addendum)
[facility] PROGRESS NOTE  Carolyn Norton EVO:[id number] DOB: [birth year] DOA: 01/11/2021 PCP: [name], FNP  Addendum: This is a 55 year old female with past medical history of diabetes mellitus type 2, bipolar disorder, cocaine abuse, cognitive impairment She presented from home with hyperglycemia (CBG 700s) confusion, medication noncompliance for about 3 days, generalized weakness with multiple falls over the past 3 weeks.  She has had a psychiatry, palliative care, neurology and neurosurgery consults.  Her hospital stay has been complicated by progressive cognitive and functional decline.  I am having a difficult time examining her today as she is sleepy not answering questions and not following commands.  She has had poor oral intake with dehydration.  Primary problem: Altered mental status, acute encephalopathy - On psychiatric eval on 5/14, she scored a 29 out of 30 on a Mini-Mental exam.  She was recommended to take Lexapro 20 mg daily Depakote 500 mg daily gabapentin 300 mg -On 5/29 neurology was consulted due to worsening mental status found to have some findings concerning for NPH -Neurosurgery was consulted on 6/5 and on 6/10 there was a plan to proceed with a lumbar drain trial however it was canceled as she was found to have a UTI on 6/10 Neurosurgery recommended follow-up in 1 month - Large-volume LP performed on 6/30-diagnosis of NPH was ruled out as she did not have significant improvement-neurology noted that she needs outpatient neurosurgical eval for consideration of a VP shunt but it will not be meaningful until she has improvement in mentation and ambulation. -Neurology feels she has multifactorial encephalopathy along with hospitalization -Last neurology note from 7/2 recommends outpatient follow-up by neurology and neurosurgery  Other active problems: Severe lower extremity weakness - MRI cervical thoracic lumbar spine nonrevealing - Physical  therapy ongoing  Poor oral intake - Calorie count started today -Megace started on 03/11/2019 -IV fluids have been on hold and we are following her oral intake  Diabetes mellitus type 2 uncontrolled - Hemoglobin A1c is 11 I have increased her Lantus from 5 to 10 U due to CBG of 300 this AM and 200s yesterday.  Cont AC and HS SSI.   E. coli and Klebsiella UTI - Has been treated  Bipolar disorder - Lexapro, Depakote and gabapentin recommended by psych-all of these have been discontinued since the last psych eval  History of polysubstance abuse Including cocaine and alcohol -On 2/15 she was cocaine and benzodiazepine positive, alcohol level was less than 10  Calvert Cantor, MD [facility] Pager: [email or web address]       Status: Remains inpatient appropriate because:Unsafe d/c plan-due to patient's underlying poor mental status short-term memory deficits she will require 24/7 care after discharge  Dispo:  Patient From:  Home  Planned Disposition: SNF  Medically stable for discharge:  yes  Barriers to DC: Continues to require significant assistance with mobility.  Continues to have significant short-term memory deficits s significant progressive decline in mobility impeding patient's ability to safely return home with elderly parents; please note that the patient's father continues to work outside of the home therefore the burden of care would follow-up on the patient's elderly mother             Difficult to place: Yes  Level of care: Med-Surg  Code Status: DNR Family Communication: Father 7/1 via VM DVT prophylaxis: Lovenox COVID vaccination status: Unknown-post COVID infection 09/06/20   HPI: 55 year old female past medical history of diabetes mellitus type 2, bipolar disorder type I, previous COVID  infection, polysubstance abuse that includes cocaine.  She presented from home where she lives with her parents.  Her presenting symptoms included generalized weakness, multiple  falls and confusion.  Family confirmed that she had been having the symptoms for at least a month with last use of crack cocaine 1 month prior to presentation.  Ever since that time she had not been doing well.  She would also be having issues with hyperglycemia.  In the ER she was diagnosed with severe dehydration, hyperglycemia and metabolic encephalopathy.  Since admission patient has been evaluated by the psychiatric team who has deemed her alert and oriented and having capacity to make decisions.  Patient is agreeable to placement at a rehab.  Currently she has severe physical deconditioning which was POA and PT is recommending SNF.  Because of her history of polysubstance abuse and lack of funding SNF is not an option.  Family is agreeable to taking her back in home when she is able to ambulate well enough to get back and forth for meals into the bathroom.  Subjective: Awakened.  Continues with pleasant almost childlike affect.  Was trying to get her to lift her right hand to touch her nose but she was unable to perform this task even when arm was lifted for her and finger touched to her nose.  She did laugh when I asked her to pick boogers out of her nose.   Objective: Vitals:   03/11/21 1924 03/12/21 0441  BP: 140/78 (!) 165/84  Pulse: 89 93  Resp: 20 18  Temp: 98 F (36.7 C) 98.9 F (37.2 C)  SpO2: 96% 96%   No intake or output data in the 24 hours ending 03/12/21 0736  Filed Weights   01/11/21 1152  Weight: 99.8 kg    Exam:  Constitutional: Awakens.  Remains excessively fatigued but pleasant when awakened. Respiratory: Lungs remain clear.  She is stable on room air with no increased work of breathing at rest Cardiac: S1-S2 without any peripheral edema.  Pulse remains regular Abdomen: LBM 6/27, soft nontender with variable oral intake primarily poor.  Nurse stated this morning when fed she did eat about 50% of her breakfast. Neurologic: Cranial nerves are grossly intact.   Today she was unable to lift upper extremities on command but did weakly grip with right hand on command grip strength 2/5.  She was also unable to lift legs on command but did wiggle both toes when asked to lift leg. Psychiatric: Awakens.  Oriented to self and place.  At times will perseverate on answers she gives.  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  Initially felt to be partially due to NPH physiology given after initial large-volume LP procedure she did have some improvement in mobility and cognition.  Patient had another significant decline in mentation that was later deemed secondary to acute UTI noting her mentation rapidly improved after administration of antibiotics. MRI of her spine without any abnormalities to explain her progressive lower extremity weakness. After multiple discussions with the neurology team it was felt that patient likely has an underlying dementia type process related to prior significant drug use in combination with her known bipolar illness.  Recommendation is to continue current DNR status but patient has improved to the point where comfort care is no  longer indicated although after discussions with the palliative medicine team throughout the hospitalization family continues to decline PEG tube Poor oral intake-SLP reevaluated and she is currently on a D2 diet Mental status waxes and wanes regarding alertness.  Continues with poor oral intake but Megace recently resumed. Father still hopeful for recovery and recently had expressed interest in short-term enteral tube feeding via cortrack.  Parents previously informed palliative medicine team that patient would not  want a PEG.  Family to bring in preadmission advanced directives  Profound physical deconditioning/critical illness myopathy Likely a combination of persistent altered mental status and inability to participate consistently with PT as well as myopathy of critical illness  Ventriculomegaly/? NPH NPH ruled out as above  Acute hypoxemia/COPD Resolved and felt to be secondary to hypoventilation from altered mentation  Dehydration with acute kidney injury/hyperkalemia Peak creatinine 1.38 on 6/27.  This in context of hyperglycemia 513 Creatinine corrected after hydration IV fluids have been discontinued.  We will no longer utilize IV fluids because she is not eating and drinking.  We need to obtain a realistic picture of her overall oral intake and hydration status without IV fluid supplementation  Anorexia/Odynophagia 7/4 resume Megace and continue DVT prophylaxis as long as this medication is in place Family confirmed with palliative medicine in this hospitalization that they do not wish for PEG tube placement Begin calorie count today.  Father hopeful that patient will improve.  Family to bring in advance directives.  If it is clearly documented patient would not want a PEG tube then would not pursue cortrack for short-term enteral feeds.  E. Coli [name]/Klebsiella UTI Treated Use purewick only at bedtime  Diabetes mellitus 2 with hyperglycemia on insulin Hemoglobin A1c 11.0 Change Lantus to 5 units HS Continue every 4 hours SSI    Dyslipidemia Statin on hold while n.p.o.  History of bipolar 1 disorder/polysubstance abuse/cognitive impairment Continue to hold escitalopram and Neurontin. PO Keppra dc'd on 6/29 and changed to IV due to AMS/not safe for PO. Pharmacy substituted IV Keppra for Depakote since no IV Depakene available Will decrease PO Keppra dose to 250 mg BID x 3 days then dc. Could be affecting mentation.  Will re order Levetiracetram level Functional cognitive evaluation  with significant abnormalities  Physical deconditioning/bilateral heel cord shortening Continue bilateral PRAFO boots on every 4 hours and off every 4 hours    Other problems: GERD Oral PPI on hold while n.p.o.  Hypokalemia/hypomagnesemia Resolved  Data Reviewed: Basic Metabolic Panel: Recent Labs  Lab 03/05/21 0856 03/06/21 0412 03/10/21 0357 03/11/21 0757  NA 136 140 139 139  K 4.0 3.8 3.3* 3.8  CL 99 104 105 101  CO2 28 30 28 27   GLUCOSE 299* 167* 148* 281*  BUN 14 7 6 7   CREATININE 1.01* 0.86 0.81 0.84  CALCIUM 9.2 9.3 9.1 9.4   Liver Function Tests: Recent Labs  Lab 03/05/21 0856  AST 14*  ALT 11  ALKPHOS 42  BILITOT 0.4  PROT 6.1*  ALBUMIN 2.7*   No results for input(s): LIPASE, AMYLASE in the last 168 hours. No results for input(s): AMMONIA in the last 168 hours. CBC: Recent Labs  Lab 03/10/21 0357  WBC 9.2  HGB 12.5  HCT 37.8  MCV 88.3  PLT 333   Cardiac Enzymes: No results for input(s): CKTOTAL, CKMB, CKMBINDEX, TROPONINI in the last 168 hours. BNP (last 3 results) Recent Labs    01/14/21 0238 01/15/21 0230 01/16/21 0201  BNP 177.8* 115.9* 40.9  ProBNP (last 3 results) No results for input(s): PROBNP in the last 8760 hours.  CBG: Recent Labs  Lab 03/11/21 0718 03/11/21 1141 03/11/21 1555 03/11/21 2008 03/12/21 0727  GLUCAP 250* 248* 181* 219* 342*    Recent Results (from the past 240 hour(s))  Culture, Urine     Status: Abnormal   Collection Time: 03/03/21  8:15 AM   Specimen: Urine, Catheterized  Result Value Ref Range Status   Specimen Description URINE, CATHETERIZED  Final   Special Requests   Final    NONE Performed at [facility], [address], [place], [place] [address]    Culture (A)  Final    60,000 COLONIES/mL ESCHERICHIA COLI 10,000 COLONIES/mL KLEBSIELLA PNEUMONIAE    Report Status 03/05/2021 FINAL  Final   Organism ID, Bacteria ESCHERICHIA COLI (A)  Final   Organism ID, Bacteria KLEBSIELLA  PNEUMONIAE (A)  Final      Susceptibility   Escherichia coli - MIC*    AMPICILLIN >=32 RESISTANT Resistant     CEFAZOLIN <=4 SENSITIVE Sensitive     CEFEPIME <=0.12 SENSITIVE Sensitive     CEFTRIAXONE <=0.25 SENSITIVE Sensitive     CIPROFLOXACIN >=4 RESISTANT Resistant     GENTAMICIN >=16 RESISTANT Resistant     IMIPENEM <=0.25 SENSITIVE Sensitive     NITROFURANTOIN <=16 SENSITIVE Sensitive     TRIMETH/SULFA <=20 SENSITIVE Sensitive     AMPICILLIN/SULBACTAM 16 INTERMEDIATE Intermediate     PIP/TAZO <=4 SENSITIVE Sensitive     * 60,000 COLONIES/mL ESCHERICHIA COLI   Klebsiella pneumoniae - MIC*    AMPICILLIN RESISTANT Resistant     CEFAZOLIN <=4 SENSITIVE Sensitive     CEFEPIME <=0.12 SENSITIVE Sensitive     CEFTRIAXONE <=0.25 SENSITIVE Sensitive     CIPROFLOXACIN <=0.25 SENSITIVE Sensitive     GENTAMICIN <=1 SENSITIVE Sensitive     IMIPENEM <=0.25 SENSITIVE Sensitive     NITROFURANTOIN 64 INTERMEDIATE Intermediate     TRIMETH/SULFA <=20 SENSITIVE Sensitive     AMPICILLIN/SULBACTAM <=2 SENSITIVE Sensitive     PIP/TAZO <=4 SENSITIVE Sensitive     * 10,000 COLONIES/mL KLEBSIELLA PNEUMONIAE  Culture, blood (Routine X 2) w Reflex to ID Panel     Status: None   Collection Time: 03/05/21  1:06 PM   Specimen: BLOOD LEFT HAND  Result Value Ref Range Status   Specimen Description BLOOD LEFT HAND  Final   Special Requests   Final    BOTTLES DRAWN AEROBIC AND ANAEROBIC Blood Culture adequate volume   Culture   Final    NO GROWTH 5 DAYS Performed at [facility], [address] [place], [place] [address]    Report Status 03/10/2021 FINAL  Final  Culture, blood (Routine X 2) w Reflex to ID Panel     Status: None   Collection Time: 03/05/21  1:14 PM   Specimen: BLOOD RIGHT HAND  Result Value Ref Range Status   Specimen Description BLOOD RIGHT HAND  Final   Special Requests   Final    BOTTLES DRAWN AEROBIC AND ANAEROBIC Blood Culture adequate volume   Culture   Final    NO  GROWTH 5 DAYS Performed at [facility], [address] [place], [place] [address]    Report Status 03/10/2021 FINAL  Final  CSF culture w Gram Stain     Status: None   Collection Time: 03/06/21 10:23 AM   Specimen: PATH Cytology CSF; Cerebrospinal Fluid  Result Value Ref Range Status  Specimen Description CSF  Final   Special Requests NONE  Final   Gram Stain   Final    WBC PRESENT, PREDOMINANTLY MONONUCLEAR NO ORGANISMS SEEN CYTOSPIN SMEAR    Culture   Final    NO GROWTH 3 DAYS Performed at [facility], [address] [place], [place] [address]    Report Status 03/10/2021 FINAL  Final      Studies: No results found.  Scheduled Meds:  enoxaparin (LOVENOX) injection  40 mg Subcutaneous Q24H   feeding supplement  237 mL Oral BID BM   insulin aspart  0-20 Units Subcutaneous TID AC & HS   insulin glargine  5 Units Subcutaneous QHS   levETIRAcetam  250 mg Oral BID   magic mouthwash w/lidocaine  10 mL Oral QID   megestrol  800 mg Oral Daily   Continuous Infusions:     Principal Problem:   Bipolar 1 disorder, depressed, full remission (HCC) Active Problems:   Bipolar 1 disorder (HCC)   Hypertension   Generalized weakness   Controlled type 2 diabetes mellitus with hyperglycemia, with long-term current use of insulin (HCC)   Hyperlipidemia   NPH (normal pressure hydrocephalus) (HCC)   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   Consultants: Psychiatry Neurology Neurosurgery  Procedures: None  Antibiotics: Anti-infectives (From admission, onward)    Start     Dose/Rate Route Frequency Ordered Stop   03/06/21 0900  ceFAZolin (ANCEF) IVPB 1 g/50 mL premix        1 g 100 mL/hr over 30 Minutes Intravenous Every 8 hours 03/06/21 0801 03/10/21 0559   03/04/21 1430  cefTRIAXone (ROCEPHIN) 1 g in sodium chloride 0.9 % 100 mL IVPB  Status:  Discontinued        1 g 200 mL/hr over 30 Minutes Intravenous Every 24 hours 03/04/21 1335 03/06/21 0800   02/14/21 0828   vancomycin (VANCOREADY) IVPB 1000 mg/200 mL  Status:  Discontinued        1,000 mg 200 mL/hr over 60 Minutes Intravenous 60 min pre-op 02/14/21 0828 03/02/21 1139   02/13/21 2115  cefTRIAXone (ROCEPHIN) 1 g in sodium chloride 0.9 % 100 mL IVPB  Status:  Discontinued        1 g 200 mL/hr over 30 Minutes Intravenous Every 24 hours 02/13/21 2016 02/16/21 0844        Time spent: 25 minutes    [name] ANP  [facility] 7 am - 330 pm/M-F for direct patient care and secure chat Please refer to Amion for contact info 59  days

## 2021-03-12 NOTE — Plan of Care (Signed)

## 2021-03-13 ENCOUNTER — Inpatient Hospital Stay (HOSPITAL_COMMUNITY): Payer: Medicaid Other

## 2021-03-13 DIAGNOSIS — T17908S Unspecified foreign body in respiratory tract, part unspecified causing other injury, sequela: Secondary | ICD-10-CM

## 2021-03-13 LAB — BASIC METABOLIC PANEL
Anion gap: 10 (ref 5–15)
BUN: 9 mg/dL (ref 6–20)
CO2: 26 mmol/L (ref 22–32)
Calcium: 9.5 mg/dL (ref 8.9–10.3)
Chloride: 103 mmol/L (ref 98–111)
Creatinine, Ser: 0.98 mg/dL (ref 0.44–1.00)
GFR, Estimated: >60 mL/min (ref >60)
Glucose, Bld: 239 mg/dL — ABNORMAL HIGH (ref 70–99)
Potassium: 3.4 mmol/L — ABNORMAL LOW (ref 3.5–5.1)
Sodium: 139 mmol/L (ref 135–145)

## 2021-03-13 LAB — GLUCOSE, CAPILLARY
Glucose-Capillary: 403 mg/dL — ABNORMAL HIGH (ref 70–99)
Glucose-Capillary: 351 mg/dL — ABNORMAL HIGH (ref 70–99)
Glucose-Capillary: 221 mg/dL — ABNORMAL HIGH (ref 70–99)
Glucose-Capillary: 359 mg/dL — ABNORMAL HIGH (ref 70–99)
Glucose-Capillary: 420 mg/dL — ABNORMAL HIGH (ref 70–99)

## 2021-03-13 LAB — LEVETIRACETAM LEVEL: Levetiracetam Lvl: 18.7 ug/mL (ref 10.0–40.0)

## 2021-03-13 LAB — CBC
HCT: 37.7 % (ref 36.0–46.0)
Hemoglobin: 12.6 g/dL (ref 12.0–15.0)
MCH: 29.3 pg (ref 26.0–34.0)
MCHC: 33.4 g/dL (ref 30.0–36.0)
MCV: 87.7 fL (ref 80.0–100.0)
Platelets: 409 10*3/uL — ABNORMAL HIGH (ref 150–400)
RBC: 4.3 MIL/uL (ref 3.87–5.11)
RDW: 15.4 % (ref 11.5–15.5)
WBC: 9.3 10*3/uL (ref 4.0–10.5)
nRBC: 0 % (ref 0.0–0.2)

## 2021-03-13 MED ORDER — GLUCERNA SHAKE PO LIQD
237.0000 mL | Freq: Three times a day (TID) | ORAL | Status: DC
  Administered 2021-03-13 – 2021-03-18 (×9): 237 mL via ORAL
  Filled 2021-03-13 (×5): qty 237

## 2021-03-13 MED ORDER — INSULIN GLARGINE 100 UNIT/ML ~~LOC~~ SOLN
5.0000 [IU] | Freq: Every day | SUBCUTANEOUS | Status: DC
  Administered 2021-03-13: 5 [IU] via SUBCUTANEOUS
  Filled 2021-03-13: qty 0.05

## 2021-03-13 MED ORDER — IPRATROPIUM-ALBUTEROL 0.5-2.5 (3) MG/3ML IN SOLN
3.0000 mL | Freq: Four times a day (QID) | RESPIRATORY_TRACT | Status: DC
  Administered 2021-03-14: 3 mL via RESPIRATORY_TRACT
  Filled 2021-03-13: qty 3

## 2021-03-13 MED ORDER — IPRATROPIUM-ALBUTEROL 0.5-2.5 (3) MG/3ML IN SOLN
3.0000 mL | RESPIRATORY_TRACT | Status: DC
  Administered 2021-03-13 (×4): 3 mL via RESPIRATORY_TRACT
  Filled 2021-03-13 (×4): qty 3

## 2021-03-13 MED ORDER — POTASSIUM CHLORIDE CRYS ER 20 MEQ PO TBCR
40.0000 meq | EXTENDED_RELEASE_TABLET | ORAL | Status: AC
  Administered 2021-03-13 (×2): 40 meq via ORAL
  Filled 2021-03-13 (×2): qty 2

## 2021-03-13 MED ORDER — INSULIN GLARGINE 100 UNIT/ML ~~LOC~~ SOLN
18.0000 [IU] | Freq: Every day | SUBCUTANEOUS | Status: DC
  Administered 2021-03-13: 18 [IU] via SUBCUTANEOUS
  Filled 2021-03-13 (×2): qty 0.18

## 2021-03-13 MED ORDER — INSULIN GLARGINE 100 UNIT/ML ~~LOC~~ SOLN
12.0000 [IU] | Freq: Every day | SUBCUTANEOUS | Status: DC
  Filled 2021-03-13: qty 0.12

## 2021-03-13 MED ORDER — INSULIN ASPART 100 UNIT/ML IJ SOLN
7.0000 [IU] | INTRAMUSCULAR | Status: AC
  Administered 2021-03-13: 7 [IU] via SUBCUTANEOUS

## 2021-03-13 NOTE — Progress Notes (Signed)
Calorie Count Note  72 hour calorie count ordered.  Diet: dysphagia 2, thin liquids Supplements: Ensure Enlive TID, Vital Cuisine Shake BID, Magic Cup TID  RD went to follow-up on results of Day 1 calorie count. Unfortunately, no meal tickets were kept in pt's calorie count envelope, so unable to determine exactly how many calories/grams of protein pt consumed yesterday. Discussed calorie count instructions with RN and NT in detail and provided written examples of calorie counts. Also asked RN/NT to pass on information to nightshift nurses. RD will follow-up with nurses tomorrow morning to ensure they are aware of how to complete calorie count.   Nutrition Dx: Inadequate oral intake related to lethargy/confusion as evidenced by per patient/family report, meal completion < 50%.  Goal:  Patient will meet greater than or equal to 90% of their needs  Intervention:  -Continue 72-hour calorie count -Continue Ensure Enlive TID -Continue Vital Cuisine Shake BID -Continue MVI with minerals daily -Continue Magic Cup TID. -Continue to assist feeding patient for meals and supplements.  Carolyn Gavia, MS, RD, LDN (she/her/hers) RD pager number and weekend/on-call pager number located in Amion.

## 2021-03-13 NOTE — Plan of Care (Signed)
  Problem: Health Behavior/Discharge Planning: Goal: Ability to manage health-related needs will improve Outcome: Progressing   Problem: Activity: Goal: Risk for activity intolerance will decrease Outcome: Progressing   Problem: Nutrition: Goal: Adequate nutrition will be maintained Outcome: Progressing   Problem: Coping: Goal: Level of anxiety will decrease Outcome: Progressing   

## 2021-03-13 NOTE — Progress Notes (Addendum)
[facility] PROGRESS NOTE  Lucilla LameMichelle Lee Sawtell WUJ:[id number] DOB: 05-06-1966 DOA: 01/11/2021 PCP: Dartha LodgeSteele, Anthony, FNP      Status: Remains inpatient appropriate because:Unsafe d/c plan-due to patient's underlying poor mental status short-term memory deficits she will require 24/7 care after discharge  Dispo:  Patient From:  Home  Planned Disposition: SNF  Medically stable for discharge:  yes  Barriers to DC: Continues to require significant assistance with mobility.  Continues to have significant short-term memory deficits s significant progressive decline in mobility impeding patient's ability to safely return home with elderly parents; please note that the patient's father continues to work outside of the home therefore the burden of care would follow-up on the patient's elderly mother             Difficult to place: Yes  Level of care: Med-Surg  Code Status: DNR Family Communication: Father 7/7 updated on changes in dtrs status DVT prophylaxis: Lovenox COVID vaccination status: Unknown-post COVID infection 09/06/20   HPI: 55 year old female past medical history of diabetes mellitus type 2, bipolar disorder type I, previous COVID infection, polysubstance abuse that includes cocaine.  She presented from home where she lives with her parents.  Her presenting symptoms included generalized weakness, multiple falls and confusion.  Family confirmed that she had been having the symptoms for at least a month with last use of crack cocaine 1 month prior to presentation.  Ever since that time she had not been doing well.  She would also be having issues with hyperglycemia.  In the ER she was diagnosed with severe dehydration, hyperglycemia and metabolic encephalopathy.  Since admission patient has been evaluated by the psychiatric team who has deemed her alert and oriented and having capacity to make decisions.  Patient is agreeable to placement at a rehab.  Currently she has  severe physical deconditioning which was POA and PT is recommending SNF.  Because of her history of polysubstance abuse and lack of funding SNF is not an option.  Family is agreeable to taking her back in home when she is able to ambulate well enough to get back and forth for meals into the bathroom.  Subjective: Patient alert this morning.  Sitting up in bed with breakfast tray in front of air and appears to be eating.  She tells me she is hungry today (for the first time in weeks).  I did notice the patient was experiencing increased work of breathing when I asked her if she was having some trouble breathing she stated yes.  Discussed recent history with patient's bedside nurse.  She states this morning the patient did not have any episodes of choking or coughing in breakfast.  She did states that as she was finishing her shift last night of the patient's mother was observed feeding the patient despite having episodes of coughing or choking.  RN educated the patient's mother about not feeding the patient if she is having coughing or choking into call the nurse immediately.   Objective: Vitals:   03/12/21 1341 03/12/21 1944  BP: 132/75 123/74  Pulse: (!) 101 (!) 109  Resp: 16   Temp: 98.9 F (37.2 C) 99 F (37.2 C)  SpO2: 97% 95%    Intake/Output Summary (Last 24 hours) at 03/13/2021 0747 Last data filed at 03/12/2021 1200 Gross per 24 hour  Intake 200 ml  Output --  Net 200 ml    Filed Weights   01/11/21 1152  Weight: 99.8 kg    Exam:  Constitutional: Alert and in moderate distress as evidenced by increased work of breathing Respiratory: Lungs with unilateral wheezing on the right side but otherwise good airway movement, tachypnea and use of accessory muscles.  Room air pulse oximetry 91%. Cardiac: S1-S2, mildly tachycardic radial pulse, no peripheral edema.   Abdomen: LBM 6/27, soft and nontender.  Normoactive bowel sounds -patient with self-report of increased  appetite. Neurologic: Cranial nerves are intact, continues to exhibit significant extremity weakness with strength 1-2/5.  Limited ability to lift arms off of bed and when attempts to utilize arms purposefully as noted with dysmetria. Psychiatric: Alert and oriented to self only.  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  Initially felt to be partially due to NPH physiology given after initial large-volume LP procedure she did have some improvement in mobility and cognition.  Patient had another significant decline in mentation that was later deemed secondary to acute UTI noting her mentation rapidly improved after administration of antibiotics. MRI of her spine without any abnormalities to explain her progressive lower extremity weakness. Neurology suspects chronic progressive neurological decline secondary to changes in the brain from long-term drug abuse.  This is superimposed on bipolar disorder. Patient had COVID in December 2021 and about 3 months later she began having worsening in her neurological status so it is unclear if neurological Long COVID is contributing as well.  Acute hypoxemic respiratory failure with underlying COPD Unilateral wheezing not consistent with typical COPD exacerbation therefore given events of last night suspect aspiration event Stat DuoNeb treatment and continue scheduled duo nebs at least for the next 24 hours every 4 hours Patient was on LABA Advair prior to admission but given her mental status even with a spacer she was unable to utilize accurately She was started on 2 L of oxygen and after nebulizer treatment was  noted with improvement in her O2 sats and decrease in work of breathing Portable chest x-ray unremarkable but cannot exclude aspiration of saliva and microparticles Attending physician updated and aware of above changes and will see patient today  Diabetes mellitus 2 with hyperglycemia on insulin Hemoglobin A1c 11.0 Note trend of increasing CBGs after the addition of Megace.  This is a known side effect of this medication. Increase at bedtime Lantus to 12 units and add a.m. Lantus 5 unit Continue resistant SSI 3 times daily AC Change Ensure shakes to Glucerna shakes  Profound physical deconditioning/critical illness myopathy Likely a combination of persistent altered mental status and inability to participate consistently with PT as well as myopathy of critical illness  History of bipolar 1 disorder/polysubstance abuse/cognitive impairment Continue to hold escitalopram, Depakote and Neurontin.  Ventriculomegaly/? NPH NPH ruled out as above  Dehydration with acute kidney injury/hyperkalemia Peak creatinine 1.38 on 6/27.  This in context of hyperglycemia 513 Creatinine corrected after hydration IV fluids have been discontinued.  We will no longer utilize IV fluids because she is not eating and drinking.  We need to obtain a realistic picture of her overall oral intake and hydration status without IV fluid supplementation  Anorexia/Odynophagia Continue Megace and continue DVT prophylaxis as long as this medication is in place Family confirmed with palliative medicine in this hospitalization that they do not wish for PEG tube placement Calorie count in progress Father  hopeful that patient will improve.  Family to bring in advance directives.   Palliative team updated on today's events Repeat MBSS 7/7 w/ rec for D2 diet w/ thin liquids  Dyslipidemia Hold statin until oral intake improves  Physical deconditioning/bilateral heel cord shortening Continue bilateral PRAFO boots on every 4  hours and off every 4 hours    Other problems: GERD Oral PPI on hold while n.p.o.  Hypokalemia/hypomagnesemia Resolved  E. Coli [name] UTI Treated Use purewick only at bedtime    Data Reviewed: Basic Metabolic Panel: Recent Labs  Lab 03/10/21 0357 03/11/21 0757  NA 139 139  K 3.3* 3.8  CL 105 101  CO2 28 27  GLUCOSE 148* 281*  BUN 6 7  CREATININE 0.81 0.84  CALCIUM 9.1 9.4   Liver Function Tests: No results for input(s): AST, ALT, ALKPHOS, BILITOT, PROT, ALBUMIN in the last 168 hours.  No results for input(s): LIPASE, AMYLASE in the last 168 hours. No results for input(s): AMMONIA in the last 168 hours. CBC: Recent Labs  Lab 03/10/21 0357  WBC 9.2  HGB 12.5  HCT 37.8  MCV 88.3  PLT 333   Cardiac Enzymes: No results for input(s): CKTOTAL, CKMB, CKMBINDEX, TROPONINI in the last 168 hours. BNP (last 3 results) Recent Labs    01/14/21 0238 01/15/21 0230 01/16/21 0201  BNP 177.8* 115.9* 40.9    ProBNP (last 3 results) No results for input(s): PROBNP in the last 8760 hours.  CBG: Recent Labs  Lab 03/12/21 1144 03/12/21 1622 03/12/21 2055 03/13/21 0622 03/13/21 0708  GLUCAP 327* 197* 261* 403* 351*    Recent Results (from the past 240 hour(s))  Culture, Urine     Status: Abnormal   Collection Time: 03/03/21  8:15 AM   Specimen: Urine, Catheterized  Result Value Ref Range Status   Specimen Description URINE, CATHETERIZED  Final   Special Requests   Final    NONE Performed at [facility], [address] [place], [place] [address]    Culture (A)  Final    60,000 COLONIES/mL ESCHERICHIA COLI 10,000 COLONIES/mL KLEBSIELLA PNEUMONIAE    Report Status 03/05/2021 FINAL  Final   Organism ID, Bacteria ESCHERICHIA COLI (A)  Final   Organism ID, Bacteria KLEBSIELLA PNEUMONIAE (A)  Final      Susceptibility   Escherichia coli - MIC*    AMPICILLIN >=32 RESISTANT Resistant     CEFAZOLIN <=4 SENSITIVE Sensitive     CEFEPIME <=0.12  SENSITIVE Sensitive     CEFTRIAXONE <=0.25 SENSITIVE Sensitive     CIPROFLOXACIN >=4 RESISTANT Resistant     GENTAMICIN >=16 RESISTANT Resistant     IMIPENEM <=0.25 SENSITIVE Sensitive     NITROFURANTOIN <=16 SENSITIVE Sensitive     TRIMETH/SULFA <=20 SENSITIVE Sensitive     AMPICILLIN/SULBACTAM 16 INTERMEDIATE Intermediate     PIP/TAZO <=4 SENSITIVE Sensitive     * 60,000 COLONIES/mL ESCHERICHIA COLI   Klebsiella pneumoniae - MIC*    AMPICILLIN RESISTANT Resistant     CEFAZOLIN <=4 SENSITIVE Sensitive     CEFEPIME <=0.12 SENSITIVE Sensitive     CEFTRIAXONE <=0.25 SENSITIVE Sensitive     CIPROFLOXACIN <=0.25 SENSITIVE Sensitive     GENTAMICIN <=1 SENSITIVE Sensitive     IMIPENEM <=0.25 SENSITIVE Sensitive     NITROFURANTOIN 64 INTERMEDIATE Intermediate     TRIMETH/SULFA <=20 SENSITIVE Sensitive     AMPICILLIN/SULBACTAM <=2 SENSITIVE Sensitive     PIP/TAZO <=4 SENSITIVE Sensitive     * 10,000 COLONIES/mL KLEBSIELLA  PNEUMONIAE  Culture, blood (Routine X 2) w Reflex to ID Panel     Status: None   Collection Time: 03/05/21  1:06 PM   Specimen: BLOOD LEFT HAND  Result Value Ref Range Status   Specimen Description BLOOD LEFT HAND  Final   Special Requests   Final    BOTTLES DRAWN AEROBIC AND ANAEROBIC Blood Culture adequate volume   Culture   Final    NO GROWTH 5 DAYS Performed at [facility], [address], [place], [place] [address]    Report Status 03/10/2021 FINAL  Final  Culture, blood (Routine X 2) w Reflex to ID Panel     Status: None   Collection Time: 03/05/21  1:14 PM   Specimen: BLOOD RIGHT HAND  Result Value Ref Range Status   Specimen Description BLOOD RIGHT HAND  Final   Special Requests   Final    BOTTLES DRAWN AEROBIC AND ANAEROBIC Blood Culture adequate volume   Culture   Final    NO GROWTH 5 DAYS Performed at [facility], [address], [place], [place] [address]    Report Status 03/10/2021 FINAL  Final  CSF culture w Gram Stain     Status:  None   Collection Time: 03/06/21 10:23 AM   Specimen: PATH Cytology CSF; Cerebrospinal Fluid  Result Value Ref Range Status   Specimen Description CSF  Final   Special Requests NONE  Final   Gram Stain   Final    WBC PRESENT, PREDOMINANTLY MONONUCLEAR NO ORGANISMS SEEN CYTOSPIN SMEAR    Culture   Final    NO GROWTH 3 DAYS Performed at [facility], [address] [place], [place] [address]    Report Status 03/10/2021 FINAL  Final      Studies: No results found.  Scheduled Meds:  enoxaparin (LOVENOX) injection  40 mg Subcutaneous Q24H   feeding supplement (GLUCERNA SHAKE)  237 mL Oral TID BM   insulin aspart  0-20 Units Subcutaneous TID AC & HS   insulin glargine  12 Units Subcutaneous QHS   insulin glargine  5 Units Subcutaneous Daily   magic mouthwash w/lidocaine  10 mL Oral QID   megestrol  800 mg Oral Daily   multivitamin with minerals  1 tablet Oral Daily   Continuous Infusions:     Principal Problem:   Bipolar 1 disorder, depressed, full remission (HCC) Active Problems:   Bipolar 1 disorder (HCC)   Hypertension   Generalized weakness   Controlled type 2 diabetes mellitus with hyperglycemia, with long-term current use of insulin (HCC)   Hyperlipidemia   NPH (normal pressure hydrocephalus) (HCC)   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   Consultants: Psychiatry Neurology Neurosurgery  Procedures: None  Antibiotics: Anti-infectives (From admission, onward)    Start     Dose/Rate Route Frequency Ordered Stop   03/06/21 0900  ceFAZolin (ANCEF) IVPB 1 g/50 mL premix        1 g 100 mL/hr over 30 Minutes Intravenous Every 8 hours 03/06/21 0801 03/10/21 0559   03/04/21 1430  cefTRIAXone (ROCEPHIN) 1 g in sodium chloride 0.9 % 100 mL IVPB  Status:  Discontinued        1 g 200 mL/hr over 30 Minutes Intravenous Every 24 hours 03/04/21 1335 03/06/21 0800   02/14/21 0828  vancomycin (VANCOREADY) IVPB 1000 mg/200 mL  Status:  Discontinued        1,000 mg 200  mL/hr over 60 Minutes Intravenous 60 min  pre-op 02/14/21 0828 03/02/21 1139   02/13/21 2115  cefTRIAXone (ROCEPHIN) 1 g in sodium chloride 0.9 % 100 mL IVPB  Status:  Discontinued        1 g 200 mL/hr over 30 Minutes Intravenous Every 24 hours 02/13/21 2016 02/16/21 0844        Time spent: 25 minutes    [name] ANP  [facility] 7 am - 330 pm/M-F for direct patient care and secure chat Please refer to Amion for contact info 60  days

## 2021-03-13 NOTE — Progress Notes (Signed)
Palliative:  HPI: 55 year old female past medical history of diabetes mellitus type 2, bipolar disorder type I, previous COVID infection, polysubstance abuse that includes cocaine.  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 Palliative care has been asked to get re-involved to further discuss goals of care in the setting of progressive failure to thrive and generalized weakness.    I met today at [facility] bedside after being asked to see by Erin Hearing, NP for concern for decline and complication of likely aspiration. She has labored breathing (improved on 2L oxygen) and tachycardic. She smiles and greets me but unable to answer any other questions. Concern for aspiration.   I called and spoke with father, Shelly Flatten. I reviewed with Shelly Flatten concern for aspiration and how these complications are likely to continue and will ultimately lead to Sahra's end of life. Shelly Flatten is fully aware and shares that he has been trying to prepare his family for Sylvan's poor prognosis. I further discussed with Shelly Flatten my concern for pursuing artificial feeding for Kc and that this could lead to further complications and aspiration. I worry this could cause her more harm than benefit and Shelly Flatten agrees this is not what he wants for Selenne but also indicates that he may be interested in feeding tube in the future. I also explained that Scarleth indicated on her MOST form that she would NOT want a feeding tube and Shelly Flatten questions if she understood this decision at the time. He continues to desire continued supportive care and treatment while acknowledging her poor prognosis.   All questions/concerns addressed. Emotional support provided.   Exam: Alert, confused. Breathing labored but sats mid 90s 2L oxygen. Tachycardic 110s. Abd soft.   Plan: - Hold on feeding tube for now but may need to revisit again.  - Family desires continued aggressive care otherwise  with hopes of improvement although acknowledgement of poor prognosis.   [place], NP Palliative Medicine Team Pager [phone] (Please see [email or web address] for schedule) Team Phone [phone]    Greater than 50%  of this time was spent counseling and coordinating care related to the above assessment and plan

## 2021-03-13 NOTE — Progress Notes (Signed)
  Speech Language Pathology Treatment: Dysphagia  Patient Details Name: Curtina Grills MRN: [id number] DOB: 03/17/1966 Today's Date: 03/13/2021 Time: 1212-1226 SLP Time Calculation (min) (ACUTE ONLY): 14 min  Assessment / Plan / Recommendation Clinical Impression  Pt was seen during lunch for dysphagia treatment. She was adequately alert, but verbal output was limited. Per EMR, pt demonstrated signs of aspiration with p.o. intake yesterday. Pt tolerated puree solids, dysphagia 2 solids and consecutive swallows of thin liquids without overt s/sx of aspiration. Mastication was [facility] and oral clearance was adequate, but bolus formation was prolonged. SLP suspects that pt's variability in tolerance of p.o. intake is related to variance in mentation/alertness, and not due to an underlying physiological dysfunction. However, a modified barium swallow study will be completed to further assess the structural and functional integrity of the swallow mechanism; it is scheduled for today at 1330.    HPI HPI: 55 year old female past medical history of diabetes mellitus type 2, bipolar disorder type I, previous COVID infection, polysubstance abuse that includes cocaine.  She presented from home where she lives with her parents.  Her presenting symptoms included generalized weakness, multiple falls and confusion. Has been seen by SLP in prior admissions for poor intake or vague report of dysphagia, though pt has always presented with normal swallowing, but impaired cognition.      SLP Plan  Continue with current plan of care;MBS       Recommendations  Diet recommendations: Dysphagia 2 (fine chop);Thin liquid Liquids provided via: Cup;Straw Medication Administration: Crushed with puree Supervision: Trained caregiver to feed patient Compensations: Minimize environmental distractions Postural Changes and/or Swallow Maneuvers: Seated upright 90 degrees                Oral Care Recommendations: Oral  care BID Follow up Recommendations: 24 hour supervision/assistance Plan: Continue with current plan of care;MBS       Akeylah Hendel I. Vear Clock, MS, CCC-SLP Acute Rehabilitation Services Office number [phone] Pager [phone]                Scheryl Marten 03/13/2021, 12:36 PM

## 2021-03-13 NOTE — Progress Notes (Signed)
Modified Barium Swallow Progress Note  Patient Details  Name: Carolyn Norton MRN: [id number] Date of Birth: [birth year]  Today's Date: 03/13/2021  Modified Barium Swallow completed.  Full report located under Chart Review in the Imaging Section.  Brief recommendations include the following:  Clinical Impression  Pt demonstrated some difficulty attending to tasks and did not follow commands during the study; the impact of her mentation on her performance is considered. Pt's swallow mechanism was within functional limits, but with prolonged mastication, difficulty with posterior propulsion of the barium tablet, and a mild pharyngeal delay. Pt demonstrated intermittent penetration (PAS 2, occasionally PAS 3) with consecutive swallows of thin and nectar thick liquids. It is recommended that her current diet be continued. SLP will follow briefly to ensure continued tolerance and for diet advancement back to dysphagia 3 as clinically indicated.   Swallow Evaluation Recommendations       SLP Diet Recommendations: Dysphagia 2 (Fine chop) solids;Thin liquid   Liquid Administration via: Cup;Straw   Medication Administration: Crushed with puree   Supervision: Full assist for feeding   Compensations: Minimize environmental distractions;Small sips/bites   Postural Changes: Remain semi-upright after after feeds/meals (Comment)   Oral Care Recommendations: Oral care BID      Tanette Chauca I. Vear Clock, MS, CCC-SLP Acute Rehabilitation Services Office number [phone] Pager [phone]   Scheryl Marten 03/13/2021,2:38 PM

## 2021-03-13 NOTE — Progress Notes (Addendum)
[facility] Short progress note   This is a 55 year old female with past medical history of diabetes mellitus type 2, bipolar disorder, cocaine abuse, cognitive impairment She presented from home with hyperglycemia (CBG 700s) confusion, medication noncompliance for about 3 days, generalized weakness with multiple falls over the past 3 weeks.  She has had a psychiatry, palliative care, neurology and neurosurgery consults.  Her hospital stay has been complicated by progressive cognitive and functional decline.   Subjective: Not answering questions or behaving as if she can hear me  Today's Vitals   03/13/21 1100 03/13/21 1204 03/13/21 1432 03/13/21 1535  BP:   124/77   Pulse:  (!) 104 (!) 102   Resp:   16   Temp:   98.3 F (36.8 C)   TempSrc:      SpO2: 98%  98% 96%  Weight:      Height:      PainSc:       Body mass index is 35.51 kg/m.   General exam: Appears comfortable  HEENT: PERRL - conjunctive pink Respiratory system: Clear to auscultation. Respiratory effort normal. Cardiovascular system: S1 & S2 heard, regular rate and rhythm Gastrointestinal system: Abdomen soft, non-tender, nondistended. Normal bowel sounds   Central nervous system: Alert -she does not follow commands or speak Extremities: No cyanosis, clubbing or edema Skin: No rashes or ulcers Psychiatry: Unable to assess  Primary problem: Altered mental status, acute encephalopathy - On psychiatric eval on 5/14, she scored a 29 out of 30 on a Mini-Mental exam.  She was recommended to take Lexapro 20 mg daily Depakote 500 mg daily gabapentin 300 mg -On 5/29 neurology was consulted due to worsening mental status found to have some findings concerning for NPH -Neurosurgery was consulted on 6/5 and on 6/10 there was a plan to proceed with a lumbar drain trial however it was canceled as she was found to have a UTI on 6/10 Neurosurgery recommended follow-up in 1 month - Large-volume LP performed on 6/30-diagnosis of  NPH was ruled out as she did not have significant improvement-neurology noted that she needs outpatient neurosurgical eval for consideration of a VP shunt but it will not be meaningful until she has improvement in mentation and ambulation. -Neurology feels she has multifactorial encephalopathy along with hospitalization -Last neurology note from 7/2 recommends outpatient follow-up by neurology and neurosurgery   Other active problems: Acute respiratory distress > unilateral wheezing, increased respiratory rate and use of accessory muscles -Was being fed by her elderly mother yesterday while being somnolent-the nurse educated her not to do this-suspicion that she may have aspirated-chest x-ray clear so far and no fevers noted- -Assessed by SLP and recommended D2 diet with thin liquids which is what she was previously on Continue as needed nebs  Hypokalemia - Potassium is 3.4-we will replace with 80 mEq today and recheck tomorrow  Poor oral intake - Calorie count started on 7/6 -Megace started on 03/11/2019 -IV fluids have been on hold and we are following her oral intake -7/7-she had 30% of meal today-was n.p.o. for part of the day due to suspicion for aspiration   Diabetes mellitus type 2 uncontrolled - Hemoglobin A1c is 11 - Lantus from 10 to 18U due to CBG of 351 , 221, 359 today (5 units of Lantus was also given this morning) Cont AC and HS SSI.   E. coli and Klebsiella UTI - Has been treated   Bipolar disorder - Lexapro, Depakote and gabapentin recommended by psych-all of these  have been discontinued since the last psych eval  Severe lower extremity weakness - MRI cervical thoracic lumbar spine nonrevealing - Physical therapy ongoing   History of polysubstance abuse Including cocaine and alcohol -On 2/15 she was cocaine and benzodiazepine positive, alcohol level was less than 10   Calvert Cantor, MD [facility] Pager: [email or web address]

## 2021-03-13 NOTE — Progress Notes (Signed)
CSW spoke with Delorise Shiner at [facility] who states the facility is unable to offer a bed at this time.  Edwin Dada, MSW, LCSW Transitions of Care  Clinical Social Worker II [phone]

## 2021-03-14 LAB — BASIC METABOLIC PANEL
Anion gap: 5 (ref 5–15)
BUN: 15 mg/dL (ref 6–20)
CO2: 29 mmol/L (ref 22–32)
Calcium: 9.4 mg/dL (ref 8.9–10.3)
Chloride: 106 mmol/L (ref 98–111)
Creatinine, Ser: 0.97 mg/dL (ref 0.44–1.00)
GFR, Estimated: >60 mL/min (ref >60)
Glucose, Bld: 138 mg/dL — ABNORMAL HIGH (ref 70–99)
Potassium: 3.6 mmol/L (ref 3.5–5.1)
Sodium: 140 mmol/L (ref 135–145)

## 2021-03-14 LAB — GLUCOSE, CAPILLARY
Glucose-Capillary: 303 mg/dL — ABNORMAL HIGH (ref 70–99)
Glucose-Capillary: 223 mg/dL — ABNORMAL HIGH (ref 70–99)
Glucose-Capillary: 212 mg/dL — ABNORMAL HIGH (ref 70–99)
Glucose-Capillary: 226 mg/dL — ABNORMAL HIGH (ref 70–99)

## 2021-03-14 MED ORDER — BUDESONIDE 0.25 MG/2ML IN SUSP
0.2500 mg | Freq: Two times a day (BID) | RESPIRATORY_TRACT | Status: DC
  Administered 2021-03-14 – 2021-03-18 (×9): 0.25 mg via RESPIRATORY_TRACT
  Filled 2021-03-14 (×9): qty 2

## 2021-03-14 MED ORDER — HYDRALAZINE HCL 10 MG PO TABS: 10.0000 mg | ORAL_TABLET | Freq: Four times a day (QID) | ORAL | Status: DC | PRN

## 2021-03-14 MED ORDER — INSULIN ASPART 100 UNIT/ML IJ SOLN
6.0000 [IU] | Freq: Three times a day (TID) | INTRAMUSCULAR | Status: DC
  Administered 2021-03-14 – 2021-03-17 (×8): 6 [IU] via SUBCUTANEOUS

## 2021-03-14 MED ORDER — INSULIN ASPART 100 UNIT/ML IJ SOLN
0.0000 [IU] | Freq: Every day | INTRAMUSCULAR | Status: DC
  Administered 2021-03-14 – 2021-03-18 (×3): 2 [IU] via SUBCUTANEOUS
  Administered 2021-03-19: 4 [IU] via SUBCUTANEOUS

## 2021-03-14 MED ORDER — INSULIN GLARGINE 100 UNIT/ML ~~LOC~~ SOLN
30.0000 [IU] | Freq: Every day | SUBCUTANEOUS | Status: DC
  Administered 2021-03-14 – 2021-03-15 (×2): 30 [IU] via SUBCUTANEOUS
  Filled 2021-03-14 (×3): qty 0.3

## 2021-03-14 MED ORDER — INSULIN GLARGINE 100 UNIT/ML ~~LOC~~ SOLN
12.0000 [IU] | Freq: Once | SUBCUTANEOUS | Status: DC
  Filled 2021-03-14: qty 0.12

## 2021-03-14 MED ORDER — INSULIN ASPART 100 UNIT/ML IJ SOLN
0.0000 [IU] | Freq: Three times a day (TID) | INTRAMUSCULAR | Status: DC
  Administered 2021-03-14: 3 [IU] via SUBCUTANEOUS
  Administered 2021-03-15: 7 [IU] via SUBCUTANEOUS
  Administered 2021-03-15: 9 [IU] via SUBCUTANEOUS
  Administered 2021-03-16: 5 [IU] via SUBCUTANEOUS
  Administered 2021-03-16: 3 [IU] via SUBCUTANEOUS
  Administered 2021-03-16: 9 [IU] via SUBCUTANEOUS
  Administered 2021-03-17 – 2021-03-18 (×3): 2 [IU] via SUBCUTANEOUS
  Administered 2021-03-18: 1 [IU] via SUBCUTANEOUS
  Administered 2021-03-19: 2 [IU] via SUBCUTANEOUS
  Administered 2021-03-19: 3 [IU] via SUBCUTANEOUS
  Administered 2021-03-20: 9 [IU] via SUBCUTANEOUS
  Administered 2021-03-20: 2 [IU] via SUBCUTANEOUS
  Administered 2021-03-20: 7 [IU] via SUBCUTANEOUS

## 2021-03-14 MED ORDER — IPRATROPIUM-ALBUTEROL 0.5-2.5 (3) MG/3ML IN SOLN: 3.0000 mL | Freq: Four times a day (QID) | RESPIRATORY_TRACT | Status: DC | PRN

## 2021-03-14 NOTE — Plan of Care (Signed)
  Problem: Clinical Measurements: Goal: Ability to maintain clinical measurements within normal limits will improve Outcome: Progressing Goal: Will remain free from infection Outcome: Progressing Goal: Diagnostic test results will improve Outcome: Progressing Goal: Respiratory complications will improve Outcome: Progressing Goal: Cardiovascular complication will be avoided Outcome: Progressing   Problem: Activity: Goal: Risk for activity intolerance will decrease Outcome: Progressing   Problem: Nutrition: Goal: Adequate nutrition will be maintained Outcome: Progressing   Problem: Coping: Goal: Level of anxiety will decrease Outcome: Progressing   Problem: Elimination: Goal: Will not experience complications related to bowel motility Outcome: Progressing Goal: Will not experience complications related to urinary retention Outcome: Progressing   Problem: Pain Managment: Goal: General experience of comfort will improve Outcome: Progressing   Problem: Safety: Goal: Ability to remain free from injury will improve Outcome: Progressing   Problem: Skin Integrity: Goal: Risk for impaired skin integrity will decrease Outcome: Progressing   Problem: Education: Goal: Knowledge of General Education information will improve Description: Including pain rating scale, medication(s)/side effects and non-pharmacologic comfort measures Outcome: Not Progressing   Problem: Health Behavior/Discharge Planning: Goal: Ability to manage health-related needs will improve Outcome: Not Progressing   

## 2021-03-14 NOTE — Progress Notes (Addendum)
Inpatient Diabetes Program Recommendations  AACE/ADA: New Consensus Statement on Inpatient Glycemic Control (2015)  Target Ranges:  Prepandial:   less than 140 mg/dL      Peak postprandial:   less than 180 mg/dL (1-2 hours)      Critically ill patients:  140 - 180 mg/dL   Results for AMIREE, NO (MRN [id number]) as of 03/14/2021 10:19  Ref. Range 03/13/2021 06:22 03/13/2021 07:08 03/13/2021 11:18 03/13/2021 17:00 03/13/2021 20:14  Glucose-Capillary Latest Ref Range: 70 - 99 mg/dL 993 (H) 570 (H)  20 units NOVOLOG  221 (H)  7 units NOVOLOG  5 units LANTUS  359 (H)  20 units NOVOLOG  420 (H)  7 units NOVOLOG  18 units LANTUS    Results for NAZIYAH, TIESZEN (MRN [id number]) as of 03/14/2021 10:19  Ref. Range 03/14/2021 07:56  Glucose-Capillary Latest Ref Range: 70 - 99 mg/dL 092 (H)  15 units NOVOLOG      Home DM Meds: Lantus 45 units QHS  Current Orders: Lantus 18 units QHS      Novolog Resistant Correction Scale/ SSI (0-20 units) TID AC + HS     MD- Note CBGs remain elevated  Pt received total of 23 units Lantus yesterday--CBG 303 this AM  Please consider:  1. Increase Lantus to 30 units QHS (may consider giving extra 12 units Lantus X 1 dose this AM)  2. Start Novolog Meal Coverage: Novolog 6 units TID with meals Hold if pt eats <50% of meal, Hold if pt NPO    --Will follow patient during hospitalization--  Ambrose Finland RN, MSN, CDE Diabetes Coordinator Inpatient Glycemic Control Team Team Pager: [phone] (8a-5p)

## 2021-03-14 NOTE — Progress Notes (Addendum)
[facility] PROGRESS NOTE  Carolyn LameMichelle Lee Norton YQM:[id number] DOB: [birth year] DOA: 01/11/2021 PCP: Dartha LodgeSteele, Anthony, FNP      Status: Remains inpatient appropriate because:Unsafe d/c plan-due to patient's underlying poor mental status short-term memory deficits she will require 24/7 care after discharge  Dispo:  Patient From:  Home  Planned Disposition: SNF  Medically stable for discharge:  yes  Barriers to DC: Continues to require significant assistance with mobility.  Continues to have significant short-term memory deficits s significant progressive decline in mobility impeding patient's ability to safely return home with elderly parents; please note that the patient's father continues to work outside of the home therefore the burden of care would follow-up on the patient's elderly mother             Difficult to place: Yes  Level of care: Med-Surg  Code Status: DNR Family Communication: Father 7/7 updated on changes in dtrs status DVT prophylaxis: Lovenox COVID vaccination status: Unknown-post COVID infection 09/06/20   HPI: 55 year old female past medical history of diabetes mellitus type 2, bipolar disorder type I, previous COVID infection, polysubstance abuse that includes cocaine.  She presented from home where she lives with her parents.  Her presenting symptoms included generalized weakness, multiple falls and confusion.  Family confirmed that she had been having the symptoms for at least a month with last use of crack cocaine 1 month prior to presentation.  Ever since that time she had not been doing well.  She would also be having issues with hyperglycemia.  In the ER she was diagnosed with severe dehydration, hyperglycemia and metabolic encephalopathy.  Since admission patient has been evaluated by the psychiatric team who has deemed her alert and oriented and having capacity to make decisions.  Patient is agreeable to placement at a rehab.  Currently she has  severe physical deconditioning which was POA and PT is recommending SNF.  Because of her history of polysubstance abuse been difficult to obtain an SNF bed.  Initially her family is agreeable to taking her back in home when she is able to ambulate well enough to get back and forth for meals into the bathroom.  Unfortunately her cognitive and physical status has progressively declined during this admission.  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  Palliative medicine is continuing to follow along.  Patient's previous advanced directives stated she did not want a PEG tube if she were unable to eat  Subjective: Much more lethargic today.  States she is not hungry.  Having great difficulty staying engaged with my conversation.   Objective: Vitals:   03/13/21 2343 03/14/21 0540  BP: (!) 142/87 (!) 159/85  Pulse: (!) 103 86  Resp: 19   Temp: 99.3 F (37.4 C) 98.3 F (36.8 C)  SpO2: 96% 100%    Intake/Output Summary (Last 24 hours) at 03/14/2021 0743 Last data filed at 03/13/2021 0900 Gross per 24 hour  Intake 240 ml  Output --  Net 240 ml    Filed Weights   01/11/21 1152  Weight: 99.8 kg    Exam:  Constitutional: Lethargic.  Requires a significant amount of verbal and tactile stimulation to get her to minimally interact Respiratory: Bilateral lung sounds are clear.  She remains on 2 L oxygen O2 sats between 96 and 100%, no increased work of breathing or wheezing today. Cardiac: S1-S2, BP elevated, not tachycardic, no peripheral edema Abdomen: LBM 6/27, continues  with poor oral intake.  Soft abdomen with normoactive bowel sounds and no palpable tenderness Neurologic: Much more lethargic today.  Unable to follow simple commands therefore unable to accurately assess strength and activity of extremities Psychiatric: Lethargic and was unable to answer any orientation questions.  She did respond positively  to her name and smiled briefly.  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  Initially felt to be partially due to NPH physiology given after initial large-volume LP procedure she did have some improvement in mobility and cognition.  Repeat large-volume LP without similar results.  MRI of her spine without any abnormalities to explain her progressive lower extremity weakness. 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 contributing  Acute hypoxemic respiratory failure with underlying COPD Quickly recovered after nebulizer treatment and application of oxygen No fever although in the past 24 hours T-max of 99.3-x-ray was negative so no indication to begin empiric antibiotics at this time Continue scheduled nebs Reevaluated by speech-ability to safely eat influenced by waxing and waning mentation Change scheduled duo nebs to prn Patient was on Advair prior to admission (LABA)-unable to utilize MDIs secondary to altered mental status -begin budesonide nebs BID  Diabetes mellitus 2 with hyperglycemia on insulin Hemoglobin A1c 11.0 CBGs significantly elevated after addition of Megace Past 24 hours CBGs have ranged from 221 to 420 --she has required 47 units of regular insulin and a total of 23 units of Lantus insulin Diabetes coordinator has reviewed and recommends increasing HS Lantus to 30 units (done) and adding 6 units meal coverage with instructions to only give if she has eaten at least 50% of her meals (done).  Also a one-time extra dose of Lantus 12 units given per educator recommendation as well. Continue resistant SSI 3 times  daily AC Continue Glucerna shakes  Elevated blood pressure No prior documented history of hypertension and blood pressures are not consistently elevated Begin hydralazine 10 mg PO q 6hrs prn SBP >/= 160 or or DBP >/= 110  Profound physical deconditioning/critical illness myopathy Likely a combination of persistent altered mental status and inability to participate consistently with PT as well as myopathy of critical illness  History of bipolar 1 disorder/polysubstance abuse/cognitive impairment Continue to hold home escitalopram, Depakote and Neurontin.  Ventriculomegaly/? NPH NPH ruled out as above  Dehydration with acute kidney injury/hyperkalemia Corrected with IV fluids Avoid IV fluids for now-to establish baseline regarding oral intake  Anorexia/Odynophagia Repeat MBSS 7/7 w/ rec for D2 diet w/ thin liquids Continue Megace No PEG per prior advanced directive  Dyslipidemia Hold statin until oral intake improves  Physical deconditioning/bilateral heel cord shortening Continue bilateral PRAFO boots on every 4 hours and off every 4 hours    Other problems: GERD Oral PPI on hold while n.p.o.  Hypokalemia/hypomagnesemia Resolved  E. Coli [name] UTI Treated Use purewick only at bedtime    Data Reviewed: Basic Metabolic Panel: Recent Labs  Lab 03/10/21 0357 03/11/21 0757 03/13/21 0911 03/14/21 0049  NA 139 139 139 140  K 3.3* 3.8 3.4* 3.6  CL 105 101 103 106  CO2 GLUCOSE 148* 281* 239* 138*  BUN CREATININE 0.81 0.84 0.98 0.97  CALCIUM 9.1 9.4 9.5 9.4   Liver Function Tests:  No results for input(s): AST, ALT, ALKPHOS, BILITOT, PROT, ALBUMIN in the last 168 hours.  No results for input(s): LIPASE, AMYLASE in the last 168 hours. No results for input(s): AMMONIA in the last 168 hours. CBC: Recent Labs  Lab 03/10/21 0357 03/13/21 0911  WBC 9.2 9.3  HGB 12.5 12.6  HCT 37.8 37.7  MCV 88.3 87.7  PLT 333 409*   Cardiac  Enzymes: No results for input(s): CKTOTAL, CKMB, CKMBINDEX, TROPONINI in the last 168 hours. BNP (last 3 results) Recent Labs    01/14/21 0238 01/15/21 0230 01/16/21 0201  BNP 177.8* 115.9* 40.9    ProBNP (last 3 results) No results for input(s): PROBNP in the last 8760 hours.  CBG: Recent Labs  Lab 03/13/21 0622 03/13/21 0708 03/13/21 1118 03/13/21 1700 03/13/21 2014  GLUCAP 403* 351* 221* 359* 420*    Recent Results (from the past 240 hour(s))  Culture, blood (Routine X 2) w Reflex to ID Panel     Status: None   Collection Time: 03/05/21  1:06 PM   Specimen: BLOOD LEFT HAND  Result Value Ref Range Status   Specimen Description BLOOD LEFT HAND  Final   Special Requests   Final    BOTTLES DRAWN AEROBIC AND ANAEROBIC Blood Culture adequate volume   Culture   Final    NO GROWTH 5 DAYS Performed at [facility], [address] [place], [place] [address]    Report Status 03/10/2021 FINAL  Final  Culture, blood (Routine X 2) w Reflex to ID Panel     Status: None   Collection Time: 03/05/21  1:14 PM   Specimen: BLOOD RIGHT HAND  Result Value Ref Range Status   Specimen Description BLOOD RIGHT HAND  Final   Special Requests   Final    BOTTLES DRAWN AEROBIC AND ANAEROBIC Blood Culture adequate volume   Culture   Final    NO GROWTH 5 DAYS Performed at [facility], [address], [place], [place] [address]    Report Status 03/10/2021 FINAL  Final  CSF culture w Gram Stain     Status: None   Collection Time: 03/06/21 10:23 AM   Specimen: PATH Cytology CSF; Cerebrospinal Fluid  Result Value Ref Range Status   Specimen Description CSF  Final   Special Requests NONE  Final   Gram Stain   Final    WBC PRESENT, PREDOMINANTLY MONONUCLEAR NO ORGANISMS SEEN CYTOSPIN SMEAR    Culture   Final    NO GROWTH 3 DAYS Performed at [facility], [address] [place], [place] [address]    Report Status 03/10/2021 FINAL  Final      Studies: DG CHEST  PORT 1 VIEW  Result Date: 03/13/2021 CLINICAL DATA:  Shortness of breath. EXAM: PORTABLE CHEST 1 VIEW COMPARISON:  03/05/2021. FINDINGS: Mediastinum and hilar structures normal. Heart size normal. No focal infiltrate. No pleural effusion or pneumothorax. Degenerative change in scoliosis thoracic spine. IMPRESSION: No acute cardiopulmonary disease. Electronically Signed   By: Maisie Fus  Register   On: 03/13/2021 09:34   DG Swallowing Func-Speech Pathology  Result Date: 03/13/2021 Formatting of this result is different from the original. Objective Swallowing Evaluation: Type of Study: Bedside Swallow Evaluation  Patient Details Name: Suzanne Kho MRN: [id number] Date of Birth: Oct 24, 1965 Today's Date: 03/13/2021 Time: SLP Start Time (ACUTE ONLY): 1335 -SLP Stop Time (ACUTE ONLY): 1349 SLP Time Calculation (min) (ACUTE ONLY): 14 min Past Medical History: Past Medical History: Diagnosis Date  Anxiety   Asthma   COVID   History of  Depression   Diabetes mellitus without complication (HCC)   Hyperlipidemia   Hypertension   Seasonal allergies  Past Surgical History: Past Surgical History: Procedure Laterality Date  BREAST EXCISIONAL BIOPSY Right   BREAST SURGERY    right breast HPI: 55 year old female past medical history of diabetes mellitus type 2, bipolar disorder type I, previous COVID infection, polysubstance abuse that includes cocaine.  She presented from home where she lives with her parents.  Her presenting symptoms included generalized weakness, multiple falls and confusion. Has been seen by SLP in prior admissions for poor intake or vague report of dysphagia, though pt has always presented with normal swallowing, but impaired cognition. Neurosurgery following for possible lumbar drain trial vs repeat LP vs VP shunt placement. Placement of lumbar drain on 6/10 cancelled due to UTI.7/1 underwent lumbar puncture and drain large volume of CSF to assess for potential improvement in symptomology (none noted).   No data recorded Assessment / Plan / Recommendation CHL IP CLINICAL IMPRESSIONS 03/13/2021 Clinical Impression Pt's swallow mechanism was within functional limits, but with prolonged mastication, difficulty with posterior propulsion of the barium tablet, and  a mild pharyngeal delay. Pt demonstrated intermittent penetration (PAS 2, occasionally PAS 3) with consecutive swallows of thin and nectar thick liquids. It is recommended that her current diet be continued. SLP will follow briefly to ensure continued tolerance and possible advancement back to dysphagia 3. SLP Visit Diagnosis Dysphagia, oropharyngeal phase (R13.12) Attention and concentration deficit following -- Frontal lobe and executive function deficit following -- Impact on safety and function Mild aspiration risk   CHL IP TREATMENT RECOMMENDATION 03/13/2021 Treatment Recommendations Therapy as outlined in treatment plan below   Prognosis 03/13/2021 Prognosis for Safe Diet Advancement Good Barriers to Reach Goals Cognitive deficits Barriers/Prognosis Comment -- CHL IP DIET RECOMMENDATION 03/13/2021 SLP Diet Recommendations Dysphagia 2 (Fine chop) solids;Thin liquid Liquid Administration via Cup;Straw Medication Administration Crushed with puree Compensations Minimize environmental distractions;Small sips/bites Postural Changes Remain semi-upright after after feeds/meals (Comment)   CHL IP OTHER RECOMMENDATIONS 03/13/2021 Recommended Consults -- Oral Care Recommendations Oral care BID Other Recommendations --   CHL IP FOLLOW UP RECOMMENDATIONS 03/13/2021 Follow up Recommendations 24 hour supervision/assistance   CHL IP FREQUENCY AND DURATION 03/13/2021 Speech Therapy Frequency (ACUTE ONLY) min 2x/week Treatment Duration 2 weeks      CHL IP ORAL PHASE 03/13/2021 Oral Phase -- Oral - Pudding Teaspoon -- Oral - Pudding Cup -- Oral - Honey Teaspoon -- Oral - Honey Cup -- Oral - Nectar Teaspoon -- Oral - Nectar Cup WFL Oral - Nectar Straw -- Oral - Thin Teaspoon -- Oral - Thin  Cup -- Oral - Thin Straw Decreased bolus cohesion Oral - Puree -- Oral - Mech Soft -- Oral - Regular Impaired mastication Oral - Multi-Consistency -- Oral - Pill Reduced posterior propulsion Oral Phase - Comment --  CHL IP PHARYNGEAL PHASE 03/13/2021 Pharyngeal Phase -- Pharyngeal- Pudding Teaspoon -- Pharyngeal -- Pharyngeal- Pudding Cup -- Pharyngeal -- Pharyngeal- Honey Teaspoon -- Pharyngeal -- Pharyngeal- Honey Cup -- Pharyngeal -- Pharyngeal- Nectar Teaspoon -- Pharyngeal -- Pharyngeal- Nectar Cup Penetration/Aspiration during swallow Pharyngeal Material enters airway, remains ABOVE vocal cords then ejected out;Material enters airway, remains ABOVE vocal cords and not ejected out Pharyngeal- Nectar Straw Penetration/Aspiration during swallow Pharyngeal Material enters airway, remains ABOVE vocal cords then ejected out;Material enters airway, remains ABOVE vocal cords and not ejected out Pharyngeal- Thin Teaspoon -- Pharyngeal -- Pharyngeal- Thin  Cup Penetration/Aspiration during swallow;Delayed swallow initiation-vallecula;Delayed swallow initiation-pyriform sinuses Pharyngeal Material enters airway, remains ABOVE vocal cords then ejected out;Material enters airway, remains ABOVE vocal cords and not ejected out Pharyngeal- Thin Straw Penetration/Aspiration during swallow;Delayed swallow initiation-vallecula;Delayed swallow initiation-pyriform sinuses Pharyngeal Material enters airway, remains ABOVE vocal cords then ejected out;Material enters airway, remains ABOVE vocal cords and not ejected out Pharyngeal- Puree Delayed swallow initiation-vallecula Pharyngeal -- Pharyngeal- Mechanical Soft -- Pharyngeal -- Pharyngeal- Regular -- Pharyngeal -- Pharyngeal- Multi-consistency -- Pharyngeal -- Pharyngeal- Pill -- Pharyngeal -- Pharyngeal Comment --  Shanika I. Vear Clock, MS, CCC-SLP Acute Rehabilitation Services Office number [phone] Pager [phone] Scheryl Marten 03/13/2021, 3:52 PM                Scheduled Meds:  enoxaparin (LOVENOX) injection  40 mg Subcutaneous Q24H   feeding supplement (GLUCERNA SHAKE)  237 mL Oral TID BM   insulin aspart  0-20 Units Subcutaneous TID AC & HS   insulin glargine  18 Units Subcutaneous QHS   ipratropium-albuterol  3 mL Nebulization QID   magic mouthwash w/lidocaine  10 mL Oral QID   megestrol  800 mg Oral Daily   multivitamin with minerals  1 tablet Oral Daily   Continuous Infusions:     Principal Problem:   Bipolar 1 disorder, depressed, full remission (HCC) Active Problems:   Bipolar 1 disorder (HCC)   Hypertension   Generalized weakness   Controlled type 2 diabetes mellitus with hyperglycemia, with long-term current use of insulin (HCC)   Hyperlipidemia   NPH (normal pressure hydrocephalus) (HCC)   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   Consultants: Psychiatry Neurology Neurosurgery  Procedures: None  Antibiotics: Anti-infectives (From admission, onward)    Start     Dose/Rate Route Frequency Ordered Stop   03/06/21 0900  ceFAZolin (ANCEF) IVPB 1 g/50 mL premix        1 g 100 mL/hr over 30 Minutes Intravenous Every 8 hours 03/06/21 0801 03/10/21 0559   03/04/21 1430  cefTRIAXone (ROCEPHIN) 1 g in sodium chloride 0.9 % 100 mL IVPB  Status:  Discontinued        1 g 200 mL/hr over 30 Minutes Intravenous Every 24 hours 03/04/21 1335 03/06/21 0800   02/14/21 0828  vancomycin (VANCOREADY) IVPB 1000 mg/200 mL  Status:  Discontinued        1,000 mg 200 mL/hr over 60 Minutes Intravenous 60 min pre-op 02/14/21 0828 03/02/21 1139   02/13/21 2115  cefTRIAXone (ROCEPHIN) 1 g in sodium chloride 0.9 % 100 mL IVPB  Status:  Discontinued        1 g 200 mL/hr over 30 Minutes Intravenous Every 24 hours 02/13/21 2016 02/16/21 0844        Time spent: 25 minutes    [name] ANP  [facility] 7 am - 330 pm/M-F for direct patient care and secure chat Please refer to Amion for contact info 61  days

## 2021-03-14 NOTE — Progress Notes (Signed)
Physical Therapy Treatment Patient Details Name: Carolyn Norton MRN: [id number] DOB: [birth year] Today's Date: 03/14/2021    History of Present Illness Pt is a 55 y.o. female admitted 01/11/21 with multiple falls, AMS, weakness. Workup for severe dehydration, DKA, metabolic encephalopathy. 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 Neurosurgery following for possible lumbar drain trial vs repeat LP vs VP shunt placement. Placement of lumbar drain on 6/10 cancelled due to UTI.7/1 underwent lumbar puncture and drain large volume of CSF to assess for potential improvement in symptomology (none noted);  7/2 MRI of the brain, cervical, thoracic and lumbar spine with no acute changes to explain LE weakness   PMH includes DM2, COPD, CKD3, GERD, bipolar disorder, COVID-19, polysubstance abuse including cocaine.    PT Comments    Patient received in bed, sleeping and needed heavy tactile and verbal stimulation to rouse. Opened eyes but had blank stare and did not respond to verbal stimulation. Did not respond to pressure to nail beds and only had minimal response to sternal rub. Needed totalAx2 to get to/from EOB, totalAx2 to maintain upright with no reactive response to removal of external support, and no improvement in lethargy. Difficult to assess cognition, weakness, other neuro sx today. Due to lethargic state with minimal responsiveness, PT called in nursing staff to assess status, MD also arrived to assist- per unit staff, this seems to be her most recent baseline over the past few days. Left her in bed with RN/MD present and all other needs met. Will continue to follow, however note if she does not improve may need to consider custodial level of care.     Follow Up Recommendations  SNF (possibly custodial care at this point)     Equipment Recommendations  Rolling walker with 5" wheels;3in1 (PT);Hospital bed;Wheelchair (measurements PT);Other (comment);Wheelchair cushion  (measurements PT) (hoyer lift and pads)    Recommendations for Other Services       Precautions / Restrictions Precautions Precautions: Fall Restrictions Weight Bearing Restrictions: No    Mobility  Bed Mobility Overal bed mobility: Needs Assistance Bed Mobility: Supine to Sit;Sit to Supine     Supine to sit: Total assist;+2 for physical assistance;HOB elevated Sit to supine: Total assist;+2 for physical assistance;HOB elevated   General bed mobility comments: heavy totalAx2 for all aspects of mobility. No cue following or initiation whatsoever. Tremulous in sitting and no efforts to catch balance when PT/tech momentarily released support    Transfers                 General transfer comment: unable to attempt  Ambulation/Gait             General Gait Details: unable   Stairs             Wheelchair Mobility    Modified Rankin (Stroke Patients Only)       Balance Overall balance assessment: Needs assistance;History of Falls Sitting-balance support: Feet unsupported;Bilateral upper extremity supported Sitting balance-Leahy Scale: Zero Sitting balance - Comments: heavy posterior lean requiring totalAx2 to maintain upright Postural control: Posterior lean                                  Cognition Arousal/Alertness: Lethargic Behavior During Therapy: Flat affect Overall Cognitive Status: Difficult to assess                     Current Attention Level: Focused Memory:  Decreased short-term memory;Decreased recall of precautions Following Commands: Follows one step commands inconsistently Safety/Judgement: Decreased awareness of deficits;Decreased awareness of safety Awareness: Intellectual Problem Solving: Slow processing;Difficulty sequencing;Requires verbal cues;Decreased initiation;Requires tactile cues General Comments: very lethargic, even more so than last session. Keeps eyes open and stares at ceiling. Did not  respond to nail bed pressure, only minimal localized response to sternal rub. Did not follow cues or respond to verbal or tactile stimulation      Exercises      General Comments        Pertinent Vitals/Pain Pain Assessment: Faces Faces Pain Scale: No hurt Pain Intervention(s): Limited activity within patient's tolerance;Monitored during session    Home Living                      Prior Function            PT Goals (current goals can now be found in the care plan section) Acute Rehab PT Goals Patient Stated Goal: None stated. PT Goal Formulation: Patient unable to participate in goal setting Time For Goal Achievement: 03/17/21 Potential to Achieve Goals: Fair Progress towards PT goals: Not progressing toward goals - comment (lethargic)    Frequency    Min 2X/week      PT Plan Current plan remains appropriate    Co-evaluation              AM-PAC PT "6 Clicks" Mobility   Outcome Measure  Help needed turning from your back to your side while in a flat bed without using bedrails?: Total Help needed moving from lying on your back to sitting on the side of a flat bed without using bedrails?: Total Help needed moving to and from a bed to a chair (including a wheelchair)?: Total Help needed standing up from a chair using your arms (e.g., wheelchair or bedside chair)?: Total Help needed to walk in hospital room?: Total Help needed climbing 3-5 steps with a railing? : Total 6 Click Score: 6    End of Session   Activity Tolerance: Patient limited by lethargy Patient left: with call bell/phone within reach;in bed;with bed alarm set Nurse Communication: Other (comment) (non responsive state) PT Visit Diagnosis: Muscle weakness (generalized) (M62.81);Repeated falls (R29.6);Difficulty in walking, not elsewhere classified (R26.2);Unsteadiness on feet (R26.81)     Time: 6147-0929 PT Time Calculation (min) (ACUTE ONLY): 13 min  Charges:  $Therapeutic  Activity: 8-22 mins                    Windell Norfolk, DPT, PN1   Supplemental Physical Therapist [place]    Pager [phone] Acute Rehab Office [phone]

## 2021-03-14 NOTE — Progress Notes (Signed)
Calorie Count Note  72 hour calorie count ordered.  Diet: dysphagia 2, thin liquids Supplements: Glucerna TID, Vital Cuisine Shake BID, Magic Cup TID  RD went to follow-up on results of Day 2 calorie count. Unfortunately, there were still no meal tickets kept in pt's calorie count envelope despite having had an in-depth conversation with both the RN and NT regarding how to document for the calorie count and the importance of completing the calorie count. RD still unable to determine exactly how many calories/grams of protein pt consumed yesterday. Will discuss with nursing staff again in hopes of improving documentation.   Nutrition Dx: Inadequate oral intake related to lethargy/confusion as evidenced by per patient/family report, meal completion < 50%.  Goal:  Patient will meet greater than or equal to 90% of their needs  Intervention:  -Continue calorie count throughout weekend, RD will follow-up with results on Monday, 7/11 -Continue Glucerna TID -Continue Vital Cuisine Shake BID -Continue MVI with minerals daily -Continue Magic Cup TID. -Continue to assist feeding patient for meals and supplements.  Eugene Gavia, MS, RD, LDN (she/her/hers) RD pager number and weekend/on-call pager number located in Amion.

## 2021-03-15 LAB — GLUCOSE, CAPILLARY
Glucose-Capillary: 355 mg/dL — ABNORMAL HIGH (ref 70–99)
Glucose-Capillary: 593 mg/dL (ref 70–99)
Glucose-Capillary: 318 mg/dL — ABNORMAL HIGH (ref 70–99)
Glucose-Capillary: 161 mg/dL — ABNORMAL HIGH (ref 70–99)

## 2021-03-15 MED ORDER — ENOXAPARIN SODIUM 60 MG/0.6ML IJ SOSY
0.5000 mg/kg | PREFILLED_SYRINGE | INTRAMUSCULAR | Status: DC
  Administered 2021-03-16 – 2021-03-23 (×8): 50 mg via SUBCUTANEOUS
  Filled 2021-03-15 (×8): qty 0.6

## 2021-03-15 MED ORDER — MEGESTROL ACETATE 400 MG/10ML PO SUSP
800.0000 mg | Freq: Two times a day (BID) | ORAL | Status: DC
  Administered 2021-03-15 – 2021-03-18 (×7): 800 mg via ORAL
  Filled 2021-03-15 (×8): qty 20

## 2021-03-15 NOTE — Progress Notes (Signed)
PROGRESS NOTE    Carolyn LameMichelle Lee Norton   DGU:[id number]  DOB: 07-30-1966  DOA: 01/11/2021 PCP: Dartha LodgeSteele, Anthony, FNP   Brief Narrative:  Carolyn PerchesMichelle Lee Stanleyis a 55 year old female with past medical history of diabetes mellitus type 2, bipolar disorder, cocaine abuse, cognitive impairment She presented from home with hyperglycemia (CBG 700s) confusion, medication noncompliance for about 3 days, generalized weakness with multiple falls over the past 3 weeks.  Her hospital stay has been complicated by progressive cognitive and functional decline.  She has had a psychiatry, palliative care, neurology and neurosurgery consults but no cause for her rapid cognitive decline has been found.  Subjective: Today she is alert, states she is in the hospital but when asked again a couple minutes later, does not reply. She is otherwise not oriented.     Assessment & Plan:   Altered mental status, acute encephalopathy - On psychiatric eval on 5/14, she scored a 29 out of 30 on a Mini-Mental exam.  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 -Neurology feels she has multifactorial encephalopathy   -Last neurology note from 7/2 recommends outpatient follow-up by neurology and neurosurgery   Other active problems: Acute respiratory distress >  unilateral wheezing, increased respiratory rate and use of accessory muscles -Was being fed by her elderly mother on 7/8 while being somnolent-the nurse educated her not to do this-suspicion that she may have aspirated-chest x-ray clear so far and no fevers noted- -Assessed by SLP and recommended D2 diet with thin liquids which is what she was previously on    Hypokalemia - replaced  Poor oral intake - Calorie count started on 7/6  - 7/9> breakfast> she has 25 % of her oatmeal, 100 % of her sausage patty , 100 % of her grape juice, 75% of the vanilla Hormel Shake -Megace started on 03/11/2019 -IV fluids have been on hold and we are following her oral intake -7/7-she had 30% of meal today-was n.p.o. for part of the day due to suspicion for aspiration    Diabetes mellitus type 2 uncontrolled - Hemoglobin A1c is 11 on 5/8 - sugars quite uncontrolled since starting Megace - Lantus increased to 30 U on 7/8 - Cont AC and HS SSI and mealtime insulin   E. coli and Klebsiella UTI - Has been treated   Bipolar disorder - Lexapro, Depakote and gabapentin recommended by psych-all of these have been discontinued since the last psych eval   Severe lower extremity weakness - MRI's cervical thoracic lumbar spine nonrevealing on 7/2 - Physical therapy ongoing   History of polysubstance abuse Including cocaine and alcohol -On 2/15 she was cocaine and benzodiazepine positive, alcohol level was less than 10     Time spent in minutes: 35 DVT prophylaxis: enoxaparin (LOVENOX) injection 40 mg Start: 02/26/21 1330 SCD's Start: 02/14/21 0829   Code  Status: DNR Family Communication:  Level of Care: Level of care: Med-Surg Disposition Plan:  Status is: Inpatient  Remains inpatient appropriate because:Unsafe d/c plan  Dispo:  Patient From:    Planned Disposition: To be determined  Medically stable for discharge:         Antimicrobials:  Anti-infectives (From admission, onward)    Start      Dose/Rate Route Frequency Ordered Stop   03/06/21 0900  ceFAZolin (ANCEF) IVPB 1 g/50 mL premix        1 g 100 mL/hr over 30 Minutes Intravenous Every 8 hours 03/06/21 0801 03/10/21 0559   03/04/21 1430  cefTRIAXone (ROCEPHIN) 1 g in sodium chloride 0.9 % 100 mL IVPB  Status:  Discontinued        1 g 200 mL/hr over 30 Minutes Intravenous Every 24 hours 03/04/21 1335 03/06/21 0800   02/14/21 0828  vancomycin (VANCOREADY) IVPB 1000 mg/200 mL  Status:  Discontinued        1,000 mg 200 mL/hr over 60 Minutes Intravenous 60 min pre-op 02/14/21 0828 03/02/21 1139   02/13/21 2115  cefTRIAXone (ROCEPHIN) 1 g in sodium chloride 0.9 % 100 mL IVPB  Status:  Discontinued        1 g 200 mL/hr over 30 Minutes Intravenous Every 24 hours 02/13/21 2016 02/16/21 0844        Objective: Vitals:   03/14/21 1916 03/14/21 2015 03/15/21 0430 03/15/21 0853  BP:  (!) 150/85 (!) 157/92   Pulse: 83 86 90   Resp: 16 16 16    Temp:  98.3 F (36.8 C) 98.1 F (36.7 C)   TempSrc:  Oral Oral   SpO2: 98% 97% 100% 98%  Weight:      Height:        Intake/Output Summary (Last 24 hours) at 03/15/2021 1022 Last data filed at 03/14/2021 1400 Gross per 24 hour  Intake 120 ml  Output --  Net 120 ml   Filed Weights   01/11/21 1152  Weight: 99.8 kg    Examination: General exam: Appears comfortable - very alert today- smiles and say she is doing well, asks how I am, tells me she is in the hospital but then gets distracted HEENT: PERRLA, oral mucosa moist, no sclera icterus or thrush Respiratory system: Clear to auscultation. Respiratory effort normal. Cardiovascular system: S1 & S2 heard, RRR.   Gastrointestinal system: Abdomen soft, non-tender, nondistended. Normal bowel sounds. Central nervous system: CN 2-12 intact- not following commands Extremities: No cyanosis, clubbing or edema Skin: No rashes or ulcers      Data Reviewed: I have personally reviewed following labs and imaging studies  CBC: Recent  Labs  Lab 03/10/21 0357 03/13/21 0911  WBC 9.2 9.3  HGB 12.5 12.6  HCT 37.8 37.7  MCV 88.3 87.7  PLT 333 409*   Basic Metabolic Panel: Recent Labs  Lab 03/10/21 0357 03/11/21 0757 03/13/21 0911 03/14/21 0049  NA 139 139 139 140  K 3.3* 3.8 3.4* 3.6  CL 105 101 103 106  CO2 28 27 26 29   GLUCOSE 148* 281* 239* 138*  BUN 6 7 9 15   CREATININE 0.81 0.84 0.98 0.97  CALCIUM 9.1 9.4 9.5 9.4   GFR: Estimated Creatinine Clearance: 79.9 mL/min (by C-G formula based on SCr of 0.97 mg/dL). Liver Function Tests: No results for input(s): AST, ALT, ALKPHOS, BILITOT, PROT, ALBUMIN in the last 168 hours. No results for input(s): LIPASE, AMYLASE in the last 168 hours. No results for input(s): AMMONIA  in the last 168 hours. Coagulation Profile: No results for input(s): INR, PROTIME in the last 168 hours. Cardiac Enzymes: No results for input(s): CKTOTAL, CKMB, CKMBINDEX, TROPONINI in the last 168 hours. BNP (last 3 results) No results for input(s): PROBNP in the last 8760 hours. HbA1C: No results for input(s): HGBA1C in the last 72 hours. CBG: Recent Labs  Lab 03/14/21 0756 03/14/21 1214 03/14/21 1607 03/14/21 2105 03/15/21 0802  GLUCAP 303* 223* 212* 226* 355*   Lipid Profile: No results for input(s): CHOL, HDL, LDLCALC, TRIG, CHOLHDL, LDLDIRECT in the last 72 hours. Thyroid Function Tests: No results for input(s): TSH, T4TOTAL, FREET4, T3FREE, THYROIDAB in the last 72 hours. Anemia Panel: No results for input(s): VITAMINB12, FOLATE, FERRITIN, TIBC, IRON, RETICCTPCT in the last 72 hours. Urine analysis:    Component Value Date/Time   COLORURINE YELLOW 03/03/2021 0810   APPEARANCEUR CLEAR 03/03/2021 0810   LABSPEC 1.022 03/03/2021 0810   PHURINE 5.0 03/03/2021 0810   GLUCOSEU >=500 (A) 03/03/2021 0810   HGBUR NEGATIVE 03/03/2021 0810   BILIRUBINUR NEGATIVE 03/03/2021 0810   KETONESUR 80 (A) 03/03/2021 0810   PROTEINUR NEGATIVE 03/03/2021 0810   UROBILINOGEN 0.2  07/20/2014 2022   NITRITE NEGATIVE 03/03/2021 0810   LEUKOCYTESUR SMALL (A) 03/03/2021 0810   Sepsis Labs: @LABRCNTIP (procalcitonin:4,lacticidven:4) ) Recent Results (from the past 240 hour(s))  Culture, blood (Routine X 2) w Reflex to ID Panel     Status: None   Collection Time: 03/05/21  1:06 PM   Specimen: BLOOD LEFT HAND  Result Value Ref Range Status   Specimen Description BLOOD LEFT HAND  Final   Special Requests   Final    BOTTLES DRAWN AEROBIC AND ANAEROBIC Blood Culture adequate volume   Culture   Final    NO GROWTH 5 DAYS Performed at [facility], [address], [place], [place]    Report Status 03/10/2021 FINAL  Final  Culture, blood (Routine X 2) w Reflex to ID Panel     Status: None   Collection Time: 03/05/21  1:14 PM   Specimen: BLOOD RIGHT HAND  Result Value Ref Range Status   Specimen Description BLOOD RIGHT HAND  Final   Special Requests   Final    BOTTLES DRAWN AEROBIC AND ANAEROBIC Blood Culture adequate volume   Culture   Final    NO GROWTH 5 DAYS Performed at [facility], [address], [place], [place]    Report Status 03/10/2021 FINAL  Final  CSF culture w Gram Stain     Status: None   Collection Time: 03/06/21 10:23 AM   Specimen: PATH Cytology CSF; Cerebrospinal Fluid  Result Value Ref Range Status   Specimen Description CSF  Final   Special Requests NONE  Final   Gram Stain   Final    WBC PRESENT, PREDOMINANTLY MONONUCLEAR NO ORGANISMS SEEN CYTOSPIN SMEAR    Culture   Final    NO GROWTH 3 DAYS Performed at [facility], [address], [place], [place]    Report Status 03/10/2021 FINAL  Final         Radiology Studies: DG Swallowing Func-Speech Pathology  Result Date: 03/13/2021 Formatting of this result is different from the original. Objective Swallowing Evaluation: Type of Study: Bedside Swallow Evaluation  Patient Details Name: Carolyn Norton MRN: Carolyn Norton Date of Birth:  December 28, 1965 Today's Date: 03/13/2021 Time: SLP Start Time (ACUTE ONLY): 1335 -SLP Stop Time (ACUTE ONLY): 1349 SLP Time  Calculation (min) (ACUTE ONLY): 14 min Past Medical History: Past Medical History: Diagnosis Date  Anxiety   Asthma   COVID   History of  Depression   Diabetes mellitus without complication (HCC)   Hyperlipidemia   Hypertension   Seasonal allergies  Past Surgical History: Past Surgical History: Procedure Laterality Date  BREAST EXCISIONAL BIOPSY Right   BREAST SURGERY    right breast HPI: 55 year old female past medical history of diabetes mellitus type 2, bipolar disorder type I, previous COVID infection, polysubstance abuse that includes cocaine.  She presented from home where she lives with her parents.  Her presenting symptoms included generalized weakness, multiple falls and confusion. Has been seen by SLP in prior admissions for poor intake or vague report of dysphagia, though pt has always presented with normal swallowing, but impaired cognition. Neurosurgery following for possible lumbar drain trial vs repeat LP vs VP shunt placement. Placement of lumbar drain on 6/10 cancelled due to UTI.7/1 underwent lumbar puncture and drain large volume of CSF to assess for potential improvement in symptomology (none noted).  No data recorded Assessment / Plan / Recommendation CHL IP CLINICAL IMPRESSIONS 03/13/2021 Clinical Impression Pt's swallow mechanism was within functional limits, but with prolonged mastication, difficulty with posterior propulsion of the barium tablet, and  a mild pharyngeal delay. Pt demonstrated intermittent penetration (PAS 2, occasionally PAS 3) with consecutive swallows of thin and nectar thick liquids. It is recommended that her current diet be continued. SLP will follow briefly to ensure continued tolerance and possible advancement back to dysphagia 3. SLP Visit Diagnosis Dysphagia, oropharyngeal phase (R13.12) Attention and concentration deficit following -- Frontal lobe and  executive function deficit following -- Impact on safety and function Mild aspiration risk   CHL IP TREATMENT RECOMMENDATION 03/13/2021 Treatment Recommendations Therapy as outlined in treatment plan below   Prognosis 03/13/2021 Prognosis for Safe Diet Advancement Good Barriers to Reach Goals Cognitive deficits Barriers/Prognosis Comment -- CHL IP DIET RECOMMENDATION 03/13/2021 SLP Diet Recommendations Dysphagia 2 (Fine chop) solids;Thin liquid Liquid Administration via Cup;Straw Medication Administration Crushed with puree Compensations Minimize environmental distractions;Small sips/bites Postural Changes Remain semi-upright after after feeds/meals (Comment)   CHL IP OTHER RECOMMENDATIONS 03/13/2021 Recommended Consults -- Oral Care Recommendations Oral care BID Other Recommendations --   CHL IP FOLLOW UP RECOMMENDATIONS 03/13/2021 Follow up Recommendations 24 hour supervision/assistance   CHL IP FREQUENCY AND DURATION 03/13/2021 Speech Therapy Frequency (ACUTE ONLY) min 2x/week Treatment Duration 2 weeks      CHL IP ORAL PHASE 03/13/2021 Oral Phase -- Oral - Pudding Teaspoon -- Oral - Pudding Cup -- Oral - Honey Teaspoon -- Oral - Honey Cup -- Oral - Nectar Teaspoon -- Oral - Nectar Cup WFL Oral - Nectar Straw -- Oral - Thin Teaspoon -- Oral - Thin Cup -- Oral - Thin Straw Decreased bolus cohesion Oral - Puree -- Oral - Mech Soft -- Oral - Regular Impaired mastication Oral - Multi-Consistency -- Oral - Pill Reduced posterior propulsion Oral Phase - Comment --  CHL IP PHARYNGEAL PHASE 03/13/2021 Pharyngeal Phase -- Pharyngeal- Pudding Teaspoon -- Pharyngeal -- Pharyngeal- Pudding Cup -- Pharyngeal -- Pharyngeal- Honey Teaspoon -- Pharyngeal -- Pharyngeal- Honey Cup -- Pharyngeal -- Pharyngeal- Nectar Teaspoon -- Pharyngeal -- Pharyngeal- Nectar Cup Penetration/Aspiration during swallow Pharyngeal Material enters airway, remains ABOVE vocal cords then ejected out;Material enters airway, remains ABOVE vocal cords and not ejected out  Pharyngeal- Nectar Straw Penetration/Aspiration during swallow Pharyngeal Material enters airway, remains ABOVE vocal cords then ejected out;Material enters airway, remains  ABOVE vocal cords and not ejected out Pharyngeal- Thin Teaspoon -- Pharyngeal -- Pharyngeal- Thin Cup Penetration/Aspiration during swallow;Delayed swallow initiation-vallecula;Delayed swallow initiation-pyriform sinuses Pharyngeal Material enters airway, remains ABOVE vocal cords then ejected out;Material enters airway, remains ABOVE vocal cords and not ejected out Pharyngeal- Thin Straw Penetration/Aspiration during swallow;Delayed swallow initiation-vallecula;Delayed swallow initiation-pyriform sinuses Pharyngeal Material enters airway, remains ABOVE vocal cords then ejected out;Material enters airway, remains ABOVE vocal cords and not ejected out Pharyngeal- Puree Delayed swallow initiation-vallecula Pharyngeal -- Pharyngeal- Mechanical Soft -- Pharyngeal -- Pharyngeal- Regular -- Pharyngeal -- Pharyngeal- Multi-consistency -- Pharyngeal -- Pharyngeal- Pill -- Pharyngeal -- Pharyngeal Comment --  Carolyn I. Vear Clock, MS, CCC-SLP Acute Rehabilitation Services Office number [phone] Pager [phone] Scheryl Marten 03/13/2021, 3:52 PM                 Scheduled Meds:  budesonide (PULMICORT) nebulizer solution  0.25 mg Nebulization BID   enoxaparin (LOVENOX) injection  40 mg Subcutaneous Q24H   feeding supplement (GLUCERNA SHAKE)  237 mL Oral TID BM   insulin aspart  0-5 Units Subcutaneous QHS   insulin aspart  0-9 Units Subcutaneous TID WC   insulin aspart  6 Units Subcutaneous TID WC   insulin glargine  30 Units Subcutaneous QHS   magic mouthwash w/lidocaine  10 mL Oral QID   multivitamin with minerals  1 tablet Oral Daily   Continuous Infusions:   LOS: 62 days      Calvert Cantor, MD [facility] Pager: [email or web address] 03/15/2021, 10:22 AM

## 2021-03-16 LAB — BASIC METABOLIC PANEL
Anion gap: 8 (ref 5–15)
BUN: 24 mg/dL — ABNORMAL HIGH (ref 6–20)
CO2: 25 mmol/L (ref 22–32)
Calcium: 9.3 mg/dL (ref 8.9–10.3)
Chloride: 97 mmol/L — ABNORMAL LOW (ref 98–111)
Creatinine, Ser: 1.27 mg/dL — ABNORMAL HIGH (ref 0.44–1.00)
GFR, Estimated: 51 mL/min — ABNORMAL LOW (ref >60)
Glucose, Bld: 680 mg/dL (ref 70–99)
Potassium: 4.8 mmol/L (ref 3.5–5.1)
Sodium: 130 mmol/L — ABNORMAL LOW (ref 135–145)

## 2021-03-16 LAB — GLUCOSE, CAPILLARY
Glucose-Capillary: 273 mg/dL — ABNORMAL HIGH (ref 70–99)
Glucose-Capillary: 245 mg/dL — ABNORMAL HIGH (ref 70–99)
Glucose-Capillary: 447 mg/dL — ABNORMAL HIGH (ref 70–99)
Glucose-Capillary: >600 mg/dL (ref 70–99)

## 2021-03-16 MED ORDER — INSULIN ASPART 100 UNIT/ML IJ SOLN
12.0000 [IU] | Freq: Once | INTRAMUSCULAR | Status: AC
  Administered 2021-03-16: 12 [IU] via SUBCUTANEOUS

## 2021-03-16 MED ORDER — INSULIN GLARGINE 100 UNIT/ML ~~LOC~~ SOLN
40.0000 [IU] | Freq: Every day | SUBCUTANEOUS | Status: DC
  Administered 2021-03-16: 40 [IU] via SUBCUTANEOUS
  Filled 2021-03-16 (×2): qty 0.4

## 2021-03-16 NOTE — Progress Notes (Signed)
PROGRESS NOTE    Carolyn Norton   JKD:[id number]  DOB: December 11, 1965  DOA: 01/11/2021 PCP: [name], FNP   Brief Narrative:  [name] a 55 year old female with past medical history of diabetes mellitus type 2, bipolar disorder, cocaine abuse, cognitive impairment She presented from home with hyperglycemia (CBG 700s) confusion, medication noncompliance for about 3 days, generalized weakness with multiple falls over the past 3 weeks.  Her hospital stay has been complicated by progressive cognitive and functional decline.  She has had a psychiatry, palliative care, neurology and neurosurgery consults but no cause for her rapid cognitive decline has been found.  Subjective: Alert, no complaints.    Assessment & Plan:   Altered mental status, acute encephalopathy - On psychiatric eval on 5/14, she scored a 29 out of 30 on a Mini-Mental exam.  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 -Neurology feels she has multifactorial encephalopathy   -Last neurology note from 7/2 recommends outpatient follow-up by neurology and neurosurgery   Other active problems: Acute respiratory distress > unilateral wheezing, increased respiratory rate and use of accessory muscles -Was being fed by her elderly mother on 7/8  while being somnolent-the nurse educated her not to do this-suspicion that she may have aspirated-chest x-ray clear so far and no fevers noted- -Assessed by SLP and recommended D2 diet with thin liquids which is what she was previously on    Hypokalemia - replaced  Poor oral intake - Calorie count started on 7/6 - f/u for results tomorrow -Megace started on 03/11/2019 -IV fluids have been on hold and we are following her oral intake - 7/9- megace increased to BID -- continue to follow oral intake   Diabetes mellitus type 2 uncontrolled - Hemoglobin A1c is 11 on 5/8 - sugars quite uncontrolled since starting Megace - Lantus increased to 40 U for tonight - Cont AC and HS SSI and mealtime insulin   E. coli and Klebsiella UTI - Has been treated   Bipolar disorder - Lexapro, Depakote and gabapentin recommended by psych-all of these have been discontinued since the last psych eval   Severe lower extremity weakness - MRI's cervical thoracic lumbar spine nonrevealing on 7/2 - Physical therapy ongoing   History of polysubstance abuse Including cocaine and alcohol -On 2/15 she was cocaine and benzodiazepine positive, alcohol level was less than 10     Time spent in minutes: 35 DVT prophylaxis: SCD's Start: 02/14/21 0829   Code Status: DNR Family Communication:  Level of Care: Level of care: Med-Surg Disposition Plan:  Status is: Inpatient  Remains inpatient appropriate because:Unsafe d/c plan  Dispo:  Patient From:    Planned Disposition: To be determined  Medically stable for discharge:         Antimicrobials:  Anti-infectives (From admission, onward)    Start  Dose/Rate Route Frequency Ordered Stop   03/06/21 0900  ceFAZolin (ANCEF) IVPB 1 g/50 mL premix        1 g 100 mL/hr over 30 Minutes Intravenous Every 8 hours 03/06/21 0801 03/10/21 0559   03/04/21 1430  cefTRIAXone (ROCEPHIN) 1 g in sodium chloride 0.9 % 100 mL IVPB  Status:  Discontinued        1 g 200  mL/hr over 30 Minutes Intravenous Every 24 hours 03/04/21 1335 03/06/21 0800   02/14/21 0828  vancomycin (VANCOREADY) IVPB 1000 mg/200 mL  Status:  Discontinued        1,000 mg 200 mL/hr over 60 Minutes Intravenous 60 min pre-op 02/14/21 0828 03/02/21 1139   02/13/21 2115  cefTRIAXone (ROCEPHIN) 1 g in sodium chloride 0.9 % 100 mL IVPB  Status:  Discontinued        1 g 200 mL/hr over 30 Minutes Intravenous Every 24 hours 02/13/21 2016 02/16/21 0844        Objective: Vitals:   03/15/21 1935 03/15/21 2130 03/16/21 0754 03/16/21 1617  BP:  (!) 143/96  (!) 162/101  Pulse:  (!) 115  (!) 107  Resp:  16  20  Temp:  98.8 F (37.1 C)  97.7 F (36.5 C)  TempSrc:  Oral  Oral  SpO2: 97% 96% 98% 96%  Weight:      Height:       No intake or output data in the 24 hours ending 03/16/21 1805  Filed Weights   01/11/21 1152  Weight: 99.8 kg    Examination: General exam: Appears comfortable - responds pleasantly initially then looks away and does not respond further to questions HEENT: PERRLA, oral mucosa moist, no sclera icterus or thrush Respiratory system: Clear to auscultation. Respiratory effort normal. Cardiovascular system: S1 & S2 heard, regular rate and rhythm Gastrointestinal system: Abdomen soft, non-tender, nondistended. Normal bowel sounds   Central nervous system: Alert - moves arms independently  Extremities: No cyanosis, clubbing or edema Skin: No rashes or ulcers        Data Reviewed: I have personally reviewed following labs and imaging studies  CBC: Recent Labs  Lab 03/10/21 0357 03/13/21 0911  WBC 9.2 9.3  HGB 12.5 12.6  HCT 37.8 37.7  MCV 88.3 87.7  PLT 333 409*    Basic Metabolic Panel: Recent Labs  Lab 03/10/21 0357 03/11/21 0757 03/13/21 0911 03/14/21 0049  NA 139 139 139 140  K 3.3* 3.8 3.4* 3.6  CL 105 101 103 106  CO2 28 27 26 29   GLUCOSE 148* 281* 239* 138*  BUN 6 7 9 15   CREATININE 0.81 0.84 0.98 0.97  CALCIUM 9.1 9.4 9.5 9.4     GFR: Estimated Creatinine Clearance: 79.9 mL/min (by C-G formula based on SCr of 0.97 mg/dL). Liver Function Tests: No results for input(s): AST, ALT, ALKPHOS, BILITOT, PROT, ALBUMIN in the last 168 hours. No results for input(s): LIPASE, AMYLASE in the last 168 hours. No results for input(s): AMMONIA in the last 168 hours. Coagulation Profile: No results for input(s): INR, PROTIME in the last 168 hours. Cardiac Enzymes: No results for input(s): CKTOTAL, CKMB, CKMBINDEX, TROPONINI in the last 168 hours. BNP (last 3 results) No results for input(s): PROBNP in the last 8760 hours. HbA1C: No results for input(s): HGBA1C in the last 72 hours. CBG: Recent Labs  Lab 03/15/21 1626 03/15/21 2131 03/16/21 0741 03/16/21 1207 03/16/21 1651  GLUCAP 318* 161* 273* 245* 447*    Lipid Profile: No results  for input(s): CHOL, HDL, LDLCALC, TRIG, CHOLHDL, LDLDIRECT in the last 72 hours. Thyroid Function Tests: No results for input(s): TSH, T4TOTAL, FREET4, T3FREE, THYROIDAB in the last 72 hours. Anemia Panel: No results for input(s): VITAMINB12, FOLATE, FERRITIN, TIBC, IRON, RETICCTPCT in the last 72 hours. Urine analysis:    Component Value Date/Time   COLORURINE YELLOW 03/03/2021 0810   APPEARANCEUR CLEAR 03/03/2021 0810   LABSPEC 1.022 03/03/2021 0810   PHURINE 5.0 03/03/2021 0810   GLUCOSEU >=500 (A) 03/03/2021 0810   HGBUR NEGATIVE 03/03/2021 0810   BILIRUBINUR NEGATIVE 03/03/2021 0810   KETONESUR 80 (A) 03/03/2021 0810   PROTEINUR NEGATIVE 03/03/2021 0810   UROBILINOGEN 0.2 07/20/2014 2022   NITRITE NEGATIVE 03/03/2021 0810   LEUKOCYTESUR SMALL (A) 03/03/2021 0810   Sepsis Labs: @LABRCNTIP (procalcitonin:4,lacticidven:4) ) No results found for this or any previous visit (from the past 240 hour(s)).        Radiology Studies: No results found.    Scheduled Meds:  budesonide (PULMICORT) nebulizer solution  0.25 mg Nebulization BID   enoxaparin (LOVENOX) injection   0.5 mg/kg Subcutaneous Q24H   feeding supplement (GLUCERNA SHAKE)  237 mL Oral TID BM   insulin aspart  0-5 Units Subcutaneous QHS   insulin aspart  0-9 Units Subcutaneous TID WC   insulin aspart  6 Units Subcutaneous TID WC   insulin glargine  30 Units Subcutaneous QHS   megestrol  800 mg Oral BID   multivitamin with minerals  1 tablet Oral Daily   Continuous Infusions:   LOS: 63 days      , MD [facility] Pager: [email or web address] 03/16/2021, 6:05 PM

## 2021-03-17 LAB — CBC
HCT: 38.6 % (ref 36.0–46.0)
Hemoglobin: 12.8 g/dL (ref 12.0–15.0)
MCH: 29 pg (ref 26.0–34.0)
MCHC: 33.2 g/dL (ref 30.0–36.0)
MCV: 87.5 fL (ref 80.0–100.0)
Platelets: 417 10*3/uL — ABNORMAL HIGH (ref 150–400)
RBC: 4.41 MIL/uL (ref 3.87–5.11)
RDW: 15.3 % (ref 11.5–15.5)
WBC: 11.3 10*3/uL — ABNORMAL HIGH (ref 4.0–10.5)
nRBC: 0 % (ref 0.0–0.2)

## 2021-03-17 LAB — BASIC METABOLIC PANEL
Anion gap: 11 (ref 5–15)
BUN: 21 mg/dL — ABNORMAL HIGH (ref 6–20)
CO2: 25 mmol/L (ref 22–32)
Calcium: 10 mg/dL (ref 8.9–10.3)
Chloride: 102 mmol/L (ref 98–111)
Creatinine, Ser: 1.09 mg/dL — ABNORMAL HIGH (ref 0.44–1.00)
GFR, Estimated: >60 mL/min (ref >60)
Glucose, Bld: 292 mg/dL — ABNORMAL HIGH (ref 70–99)
Potassium: 3.3 mmol/L — ABNORMAL LOW (ref 3.5–5.1)
Sodium: 138 mmol/L (ref 135–145)

## 2021-03-17 LAB — GLUCOSE, CAPILLARY
Glucose-Capillary: 126 mg/dL — ABNORMAL HIGH (ref 70–99)
Glucose-Capillary: 163 mg/dL — ABNORMAL HIGH (ref 70–99)
Glucose-Capillary: 169 mg/dL — ABNORMAL HIGH (ref 70–99)
Glucose-Capillary: 200 mg/dL — ABNORMAL HIGH (ref 70–99)
Glucose-Capillary: 205 mg/dL — ABNORMAL HIGH (ref 70–99)
Glucose-Capillary: 238 mg/dL — ABNORMAL HIGH (ref 70–99)
Glucose-Capillary: 387 mg/dL — ABNORMAL HIGH (ref 70–99)
Glucose-Capillary: 49 mg/dL — ABNORMAL LOW (ref 70–99)
Glucose-Capillary: 589 mg/dL (ref 70–99)

## 2021-03-17 LAB — MAGNESIUM: Magnesium: 1.7 mg/dL (ref 1.7–2.4)

## 2021-03-17 MED ORDER — INSULIN GLARGINE 100 UNIT/ML ~~LOC~~ SOLN
35.0000 [IU] | Freq: Every day | SUBCUTANEOUS | Status: DC
  Administered 2021-03-17: 35 [IU] via SUBCUTANEOUS
  Filled 2021-03-17 (×2): qty 0.35

## 2021-03-17 NOTE — TOC Progression Note (Signed)
Transition of Care [facility]) - Progression Note    Patient Details  Name: Carolyn Norton MRN: [id number] Date of Birth: 10-21-1965  Transition of Care [facility]) CM/SW Contact  Janae Bridgeman, RN Phone Number: 03/17/2021, 2:04 PM  Clinical Narrative:    CM and MSW with DTP Team has received no bed offers at this time for SNF placement.  I faxed the patient out in the hub for current skilled nursing facilities in the area that have possible bed availability and receptive to accepting LOG contract for placement.  The patient is being actively followed by Palliative Care at this time.  CM and MSW continue to follow the patient for SNF placement.   Expected Discharge Plan: Skilled Nursing Facility Barriers to Discharge: Family Issues, Continued Medical Work up  Expected Discharge Plan and Services Expected Discharge Plan: Skilled Nursing Facility In-house Referral: Clinical Social Work Discharge Planning Services: CM Consult Post Acute Care Choice: Skilled Nursing Facility Living arrangements for the past 2 months: Single Family Home Expected Discharge Date: 01/16/21               DME Arranged: [name] rolling DME Agency: [employer] Date DME Agency Contacted: 01/20/21 Time DME Agency Contacted: [id number] Representative spoke with at DME Agency: [name]             Social Determinants of Health (SDOH) Interventions    Readmission Risk Interventions Readmission Risk Prevention Plan 01/14/2021  Transportation Screening Complete  PCP or Specialist Appt within 3-5 Days (No Data)  Social Work Consult for Recovery Care Planning/Counseling Complete  Palliative Care Screening Not Applicable  Medication Review Ocean[facility]) Referral to Pharmacy  Some recent data might be hidden

## 2021-03-17 NOTE — Progress Notes (Signed)
Calorie Count Note  72 hour calorie count ordered Diet: dysphagia 2, thin liquids Supplements: Glucerna TID, Vital Cuisine Shake BID, Magic Cup TID   Day 1 Results (03/14/21): Breakfast: 0 kcals, 0 grams protein Lunch: 45 kcals, 1 grams protein Dinner: no meal documentation provided Supplements: 510 kcals, 19 grams protein  Day 1 Total Intake: 555 kcal (33% of minimum estimated needs)  20 grams protein (19% of minimum estimated needs)    Day 2 Results (03/15/21): Breakfast: 349 kcals, 11 grams protein Lunch: no meal documentation provided  Dinner: 87 kcals, 1 gram protein Supplements: 419 kcals, 17.5 grams protein  Day 2 Total Intake: 855 kcal (52% of minimum estimated needs)  29.5 grams protein (28% of minimum estimated needs)    Day 3 Results (03/16/21): Breakfast: 338 kcals, 15 grams protein Lunch: 169 kcals, 10.5 grams protein Dinner: no meal documentation available  Supplements: 435 kcals, 13.5 grams protein  Day 3 Total Intake: 942 kcal (57% of minimum estimated needs)  39 grams protein (37% of minimum estimated needs)    Estimated Needs: 1650-1850 kcals/d 105-120g protein/d >1.65L fluid/d  Nutrition Dx: Inadequate oral intake related to lethargy/confusion as evidenced by per patient/family report, meal completion < 50%. -- ongoing   Goal: Patient will meet greater than or equal to 90% of their needs -- progressing   Interventions: -Continue calorie count as pt's PO intake appears to be improving; however, if intake is not adequate within the next 48 hours, recommend discussion regarding long-term nutrition support  -Continue Glucerna TID -Continue Vital Cuisine Shake BID -Continue MVI with minerals daily -Continue Magic Cup TID -Continue to assist feeding patient for meals and supplements    Eugene Gavia, MS, RD, LDN (she/her/hers) RD pager number and weekend/on-call pager number located in Amion.

## 2021-03-17 NOTE — Progress Notes (Addendum)
PROGRESS NOTE    Carolyn Norton   TGY:[id number]  DOB: 1966-02-03  DOA: 01/11/2021 PCP: [name], FNP   Brief Narrative:  Carolyn Norton a 55 year old female with past medical history of diabetes mellitus type 2, bipolar disorder, cocaine abuse, cognitive impairment who lives with her parents and is taken care of by them. She was unable to give a history in the ED.  She presented from home with hyperglycemia (CBG 700s), steady cognitive and function decline at home  with inability to ambulate with multiple falls over the past 3 weeks. She had been confused for 4 months (since she had COVID). She had stopped taking her meds 3 days prior to coming in which appeared to be the reason her parents brought her. She was unable to ambulate in the ED and had hyperglycemia (CBG 729).  She was admitted to [facility].  Her hospital stay has been complicated by progressive cognitive and functional decline.  She has had psychiatry, palliative care, neurology and neurosurgery consults but no cause for her rapid cognitive decline has been found.  Subjective: No complaints.    Assessment & Plan:   Altered mental status, acute encephalopathy - On psychiatric eval on 5/14, she scored a 29 out of 30 on a Mini-Mental exam.  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- multiple LPs done -Neurosurgery was consulted on 6/5 and on 6/10 there was a plan to proceed with a lumbar drain trial however it was canceled as she was found to have a UTI on 6/10 Neurosurgery recommended follow-up in 1 month - around 6/27- found to have a UTI contributing to another change in mental status - Large-volume LP performed on 6/30-diagnosis of NPH was ruled out as she did not have significant improvement-neurology noted that 'she needs outpatient neurosurgical  eval for consideration of a VP shunt but it will not be meaningful until she has improvement in mentation and ambulation.' - neurology ultimately felt she had underlying dementia -Last neurology note from 7/2 recommends outpatient follow-up by neurology and neurosurgery - her exam remains unchanged day after day and her Mother confirms this- she initially responds appropriately by saying she is doing well and asking how you are doing but then looks away and stops responding-    Other active problems: Acute respiratory distress > unilateral wheezing, increased respiratory rate and use of accessory muscles - 7/9 -Was being fed by her elderly mother on 7/8 while being somnolent-the nurse educated her not to do this-suspicion that she may have aspirated-chest x-ray clear so far and no fevers noted- -Assessed by SLP and recommended D2 diet with thin liquids which is what she was previously on   Hypokalemia - possibly related to poor oral intake - replacing again today.  Poor oral intake - Calorie count started on 7/6 -  -Megace started on 03/11/2019 -IV fluids have been on hold and we are following her oral intake - 7/9- megace increased to BID -- continue to follow oral intake- awaiting Nutrition summary   Diabetes mellitus type 2 uncontrolled - Hemoglobin A1c is 11 on 5/8 - sugars quite uncontrolled since starting Megace - Lantus increased to 35 U for tonight (home dose was 45U) - Cont AC and HS SSI and mealtime insulin - Hypoglycemia> she did not eat 50% of lunch and was given 6 U of  the mealtime insulin that was ordered. To be given only if she ate > 50% or her meal. I have discontinued it.   COPD - not acute issues with this   E. coli and Klebsiella UTI- ~ 6/27 - Has been treated   Bipolar disorder - Lexapro, Depakote and gabapentin are home meds - psych eval 5/14- meds recommended by psych to be continued  - Depakote, Lexapro and Gabapentin were stopped on 6/29 due to lethargy    Severe lower extremity weakness (starting at least 1 month prior to admission) - I have reviewed the notes- per history obtained from her mother and father (by multiple sources), the patient had not been ambulating for weeks prior to presenting to the ED- Parents assisted her with ADLs at home.  -   first hospital PT eval on 5/8- she was not able to ambulate. - Now needs heavy 2+ assist to sit - cannot stand - I suspect severe deconditioning - MRI's cervical thoracic lumbar spine nonrevealing   - Has no been participating with PT this past week   History of polysubstance abuse Including cocaine and alcohol -On 2/15 she was cocaine and benzodiazepine positive    Time spent in minutes: 35 DVT prophylaxis: SCD's Start: 02/14/21 0829   Code Status: DNR Family Communication: I spoke with Mom today. Level of Care: Level of care: Med-Surg Disposition Plan:  Status is: Inpatient  Remains inpatient appropriate because:Unsafe d/c plan  Dispo:  Patient From:    Planned Disposition: To be determined  Medically stable for discharge:         Antimicrobials:  Anti-infectives (From admission, onward)    Start     Dose/Rate Route Frequency Ordered Stop   03/06/21 0900  ceFAZolin (ANCEF) IVPB 1 g/50 mL premix        1 g 100 mL/hr over 30 Minutes Intravenous Every 8 hours 03/06/21 0801 03/10/21 0559   03/04/21 1430  cefTRIAXone (ROCEPHIN) 1 g in sodium chloride 0.9 % 100 mL IVPB  Status:  Discontinued        1 g 200 mL/hr over 30 Minutes Intravenous Every 24 hours 03/04/21 1335 03/06/21 0800   02/14/21 0828  vancomycin (VANCOREADY) IVPB 1000 mg/200 mL  Status:  Discontinued        1,000 mg 200 mL/hr over 60 Minutes Intravenous 60 min pre-op 02/14/21 0828 03/02/21 1139   02/13/21 2115  cefTRIAXone (ROCEPHIN) 1 g in sodium chloride 0.9 % 100 mL IVPB  Status:  Discontinued        1 g 200 mL/hr over 30 Minutes Intravenous Every 24 hours 02/13/21 2016 02/16/21 0844         Objective: Vitals:   03/16/21 2126 03/17/21 0525 03/17/21 0857 03/17/21 1115  BP: (!) 157/90 136/84  (!) 121/97  Pulse: (!) 101 98  91  Resp: 19 16  18   Temp: 99.1 F (37.3 C) 97.6 F (36.4 C)  98.6 F (37 C)  TempSrc:    Oral  SpO2: 96% 95% 95% 96%  Weight:      Height:        Intake/Output Summary (Last 24 hours) at 03/17/2021 1206 Last data filed at 03/17/2021 1059 Gross per 24 hour  Intake 240 ml  Output 600 ml  Net -360 ml    Filed Weights   01/11/21 1152  Weight: 99.8 kg    Examination: Exam unchanged from yesterday  General exam: Appears comfortable  HEENT: PERRLA, oral mucosa moist, no sclera icterus or thrush  Respiratory system: Clear to auscultation. Respiratory effort normal. Cardiovascular system: S1 & S2 heard, regular rate and rhythm Gastrointestinal system: Abdomen soft, non-tender, nondistended. Normal bowel sounds   Central nervous system: Alert and oriented. No focal neurological deficits. Extremities: No cyanosis, clubbing or edema Skin: No rashes or ulcers Psychiatry:  Mood & affect appropriate.        Data Reviewed: I have personally reviewed following labs and imaging studies  CBC: Recent Labs  Lab 03/13/21 0911 03/17/21 0218  WBC 9.3 11.3*  HGB 12.6 12.8  HCT 37.7 38.6  MCV 87.7 87.5  PLT 409* 417*    Basic Metabolic Panel: Recent Labs  Lab 03/11/21 0757 03/13/21 0911 03/14/21 0049 03/16/21 2207 03/17/21 0218  NA 139 139 140 130* 138  K 3.8 3.4* 3.6 4.8 3.3*  CL 101 103 106 97* 102  CO2 27 26 29 25 25   GLUCOSE 281* 239* 138* 680* 292*  BUN 7 9 15  24* 21*  CREATININE 0.84 0.98 0.97 1.27* 1.09*  CALCIUM 9.4 9.5 9.4 9.3 10.0    GFR: Estimated Creatinine Clearance: 71.1 mL/min (A) (by C-G formula based on SCr of 1.09 mg/dL (H)). Liver Function Tests: No results for input(s): AST, ALT, ALKPHOS, BILITOT, PROT, ALBUMIN in the last 168 hours. No results for input(s): LIPASE, AMYLASE in the last 168 hours. No  results for input(s): AMMONIA in the last 168 hours. Coagulation Profile: No results for input(s): INR, PROTIME in the last 168 hours. Cardiac Enzymes: No results for input(s): CKTOTAL, CKMB, CKMBINDEX, TROPONINI in the last 168 hours. BNP (last 3 results) No results for input(s): PROBNP in the last 8760 hours. HbA1C: No results for input(s): HGBA1C in the last 72 hours. CBG: Recent Labs  Lab 03/17/21 0012 03/17/21 0413 03/17/21 0519 03/17/21 0744 03/17/21 1147  GLUCAP 387* 205* 200* 169* 163*    Lipid Profile: No results for input(s): CHOL, HDL, LDLCALC, TRIG, CHOLHDL, LDLDIRECT in the last 72 hours. Thyroid Function Tests: No results for input(s): TSH, T4TOTAL, FREET4, T3FREE, THYROIDAB in the last 72 hours. Anemia Panel: No results for input(s): VITAMINB12, FOLATE, FERRITIN, TIBC, IRON, RETICCTPCT in the last 72 hours. Urine analysis:    Component Value Date/Time   COLORURINE YELLOW 03/03/2021 0810   APPEARANCEUR CLEAR 03/03/2021 0810   LABSPEC 1.022 03/03/2021 0810   PHURINE 5.0 03/03/2021 0810   GLUCOSEU >=500 (A) 03/03/2021 0810   HGBUR NEGATIVE 03/03/2021 0810   BILIRUBINUR NEGATIVE 03/03/2021 0810   KETONESUR 80 (A) 03/03/2021 0810   PROTEINUR NEGATIVE 03/03/2021 0810   UROBILINOGEN 0.2 07/20/2014 2022   NITRITE NEGATIVE 03/03/2021 0810   LEUKOCYTESUR SMALL (A) 03/03/2021 0810   Sepsis Labs: @LABRCNTIP (procalcitonin:4,lacticidven:4) ) No results found for this or any previous visit (from the past 240 hour(s)).        Radiology Studies: No results found.    Scheduled Meds:  budesonide (PULMICORT) nebulizer solution  0.25 mg Nebulization BID   enoxaparin (LOVENOX) injection  0.5 mg/kg Subcutaneous Q24H   feeding supplement (GLUCERNA SHAKE)  237 mL Oral TID BM   insulin aspart  0-5 Units Subcutaneous QHS   insulin aspart  0-9 Units Subcutaneous TID WC   insulin aspart  6 Units Subcutaneous TID WC   insulin glargine  40 Units Subcutaneous QHS    megestrol  800 mg Oral BID   multivitamin with minerals  1 tablet Oral Daily   Continuous Infusions:   LOS: 64 days      03/05/2021, MD [facility] Pager: [email or web address] 03/17/2021,  12:06 PM

## 2021-03-18 LAB — BASIC METABOLIC PANEL
Anion gap: 11 (ref 5–15)
BUN: 20 mg/dL (ref 6–20)
CO2: 25 mmol/L (ref 22–32)
Calcium: 9.9 mg/dL (ref 8.9–10.3)
Chloride: 101 mmol/L (ref 98–111)
Creatinine, Ser: 0.97 mg/dL (ref 0.44–1.00)
GFR, Estimated: >60 mL/min (ref >60)
Glucose, Bld: 121 mg/dL — ABNORMAL HIGH (ref 70–99)
Potassium: 3.5 mmol/L (ref 3.5–5.1)
Sodium: 137 mmol/L (ref 135–145)

## 2021-03-18 LAB — GLUCOSE, CAPILLARY
Glucose-Capillary: 139 mg/dL — ABNORMAL HIGH (ref 70–99)
Glucose-Capillary: 197 mg/dL — ABNORMAL HIGH (ref 70–99)
Glucose-Capillary: 121 mg/dL — ABNORMAL HIGH (ref 70–99)
Glucose-Capillary: 209 mg/dL — ABNORMAL HIGH (ref 70–99)

## 2021-03-18 MED ORDER — INSULIN GLARGINE 100 UNIT/ML ~~LOC~~ SOLN: 20.0000 [IU] | Freq: Every day | SUBCUTANEOUS | Status: DC

## 2021-03-18 MED ORDER — INSULIN GLARGINE 100 UNIT/ML ~~LOC~~ SOLN
15.0000 [IU] | Freq: Every day | SUBCUTANEOUS | Status: DC
  Administered 2021-03-18 – 2021-03-19 (×2): 15 [IU] via SUBCUTANEOUS
  Filled 2021-03-18 (×3): qty 0.15

## 2021-03-18 MED ORDER — BOOST / RESOURCE BREEZE PO LIQD CUSTOM
1.0000 | Freq: Three times a day (TID) | ORAL | Status: DC
  Administered 2021-03-18 – 2021-03-20 (×6): 1 via ORAL

## 2021-03-18 NOTE — Progress Notes (Signed)
Physical Therapy Treatment and Discharge Patient Details Name: Carolyn Norton MRN: [id number] DOB: [birth year] Today's Date: 03/18/2021    History of Present Illness Pt is a 55 y.o. female admitted 01/11/21 with multiple falls, AMS, weakness. Workup for severe dehydration, DKA, metabolic encephalopathy. 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 Neurosurgery following for possible lumbar drain trial vs repeat LP vs VP shunt placement. Placement of lumbar drain on 6/10 cancelled due to UTI.7/1 underwent lumbar puncture and drain large volume of CSF to assess for potential improvement in symptomology (none noted);  7/2 MRI of the brain, cervical, thoracic and lumbar spine with no acute changes to explain LE weakness   PMH includes DM2, COPD, CKD3, GERD, bipolar disorder, COVID-19, polysubstance abuse including cocaine.    PT Comments    Patient seen for final session of trial of therapy. She has experienced a significant decline in cognition and is unable to follow commands or demonstrate ability to participate or benefit from PT. There is no reasonable expectation that she will be able to make functional progress at this point and has made no progress towards goals. Patient is discharged from PT. Will reach out to [facility] team to inform them of this change in recommendation (no longer recommend SNF, recommend long-term care).     Follow Up Recommendations  Other (comment) (long-term/custodial care)     Equipment Recommendations  Hospital bed;Wheelchair (measurements PT);Other (comment);Wheelchair cushion (measurements PT) (hoyer lift and pads)    Recommendations for Other Services       Precautions / Restrictions Precautions Precautions: Fall    Mobility  Bed Mobility Overal bed mobility: Needs Assistance Bed Mobility: Supine to Sit;Sit to Supine Rolling: +2 for physical assistance;Total assist   Supine to sit: Total assist;+2 for physical assistance;HOB  elevated Sit to supine: Total assist;+2 for physical assistance;HOB elevated   General bed mobility comments: heavy totalAx2 for all aspects of mobility. No cue following or initiation whatsoever. no efforts to catch balance when PT/tech momentarily released support; seated stretching of torso into flexion and rotation did not improve her balance    Transfers                    Ambulation/Gait                 Stairs             Wheelchair Mobility    Modified Rankin (Stroke Patients Only)       Balance Overall balance assessment: Needs assistance;History of Falls Sitting-balance support: Feet unsupported;Bilateral upper extremity supported Sitting balance-Leahy Scale: Zero Sitting balance - Comments: heavy posterior lean requiring totalAx2 to maintain upright Postural control: Posterior lean Standing balance support: During functional activity;Bilateral upper extremity supported Standing balance-Leahy Scale: Zero Standing balance comment: BUE support needed to static stand in stedy                            Cognition Arousal/Alertness: Awake/alert Behavior During Therapy: Flat affect Overall Cognitive Status: Difficult to assess Area of Impairment: Attention;Following commands;Problem solving                 Orientation Level:  (pt not responding verbally) Current Attention Level: Focused   Following Commands:  (followed 0/20 simple commands with incr time and repetition provided)     Problem Solving: Slow processing;Difficulty sequencing;Requires verbal cues;Decreased initiation;Requires tactile cues General Comments: wide awake, says "hello" in response to  greeting and "okay" when asked how she is doing, but no other verbalizations able to be elicited throughout session      Exercises      General Comments        Pertinent Vitals/Pain Pain Assessment: Faces Faces Pain Scale: No hurt    Home Living                       Prior Function            PT Goals (current goals can now be found in the care plan section) Acute Rehab PT Goals Patient Stated Goal: None stated. PT Goal Formulation: Patient unable to participate in goal setting Time For Goal Achievement: 03/17/21 Potential to Achieve Goals: Fair Progress towards PT goals: Not progressing toward goals - comment    Frequency           PT Plan Discharge plan needs to be updated    Co-evaluation              AM-PAC PT "6 Clicks" Mobility   Outcome Measure  Help needed turning from your back to your side while in a flat bed without using bedrails?: Total Help needed moving from lying on your back to sitting on the side of a flat bed without using bedrails?: Total Help needed moving to and from a bed to a chair (including a wheelchair)?: Total Help needed standing up from a chair using your arms (e.g., wheelchair or bedside chair)?: Total Help needed to walk in hospital room?: Total Help needed climbing 3-5 steps with a railing? : Total 6 Click Score: 6    End of Session   Activity Tolerance: Treatment limited secondary to medical complications (Comment) (awake, but not participating) Patient left: with call bell/phone within reach;in bed;with bed alarm set Nurse Communication: Other (comment) (non responsive state) PT Visit Diagnosis: Muscle weakness (generalized) (M62.81);Repeated falls (R29.6);Difficulty in walking, not elsewhere classified (R26.2);Unsteadiness on feet (R26.81)     Time: 9211-9417 PT Time Calculation (min) (ACUTE ONLY): 22 min  Charges:  $Therapeutic Activity: 8-22 mins                    Patient is being discharged from PT services secondary to:   Patient has made no progress toward goals in a reasonable time frame.  Please see latest Therapy Progress Note for current level of functioning and progress toward goals.  Progress and discharge plan and discussed with patient/caregiver and  they  Patient unable to participate in discharge planning    Jerolyn Center, PT Pager [phone]    Carolyn Norton 03/18/2021, 12:07 PM

## 2021-03-18 NOTE — Progress Notes (Signed)
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 Still unsure if NPH primary issues or other underlying problem due to h/o drug abuse/bipolar. I plan to update her family later today. RN said they were here yesterday- if this is irreversible she shouldn't get a PEG and should be comfort in my opinion"   HPI:  Per intake H&P --> 55 year old female past medical history of diabetes mellitus type 2, bipolar disorder type I, previous COVID infection, polysubstance abuse that includes cocaine.  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   Carolyn Norton has been at [facility] for the past fifty-three days is not eating or drinking sufficiently.  Palliative care has been asked to get re-involved to further discuss goals of care in the setting of progressive failure to thrive and generalized weakness.  Today's Discussion (03/18/2021): Chart reviewed.  I met with Carolyn Norton at bedside this afternoon. She was alert to self only. Patients bedside nursing technician was feeding her. She shares that today patient PO's have been very poor despite Q2H encouragement. Teasha endorses "not feeling hungry".   I was able to call patients father Carolyn Norton and mother, Carolyn Norton this afternoon. WE reviewed that again, Carolyn Norton does not appear to be taking in sufficient nutrition. Reviewed that without nutrition unfortunately we are not able to sustain long term. Patients parents understand this. Patients father Carolyn Norton shared that he remembered talking about this last week at which time he was amenable to a 3-7 day trial of tube feeding. Patients mother, Carolyn Norton shares not feeling ready to  let the patient go. I vocalized the understandable difficulties when being faced with such a decision. I shared though that we are getting closer to now needing to make a decision. Carolyn Norton vocalizes that he plans to visit tonight and he will have a decision thereafter.   I have alerted the primary team so that they may continue along with the conversation of a feeding tube tomorrow.  From a PT perspective she refused therapy today - they have noted now "There is no reasonable expectation that she will be able to make functional progress at this point and has made no progress towards goals. Patient is discharged from PT".    I worry that no matter what is pursued at this juncture Carolyn Norton will sadly, continue to slowly decline.  Questions and concerns addressed   Objective Assessment: Vital Signs Vitals:   03/18/21 0802 03/18/21 1452  BP:  135/87  Pulse: (!) 103 92  Resp: 18   Temp:  98.8 F (37.1 C)  SpO2: 94% 98%    Intake/Output Summary (Last 24 hours) at 03/18/2021 1708 Last data filed at 03/18/2021 8341 Gross per 24 hour  Intake 237 ml  Output 50 ml  Net 187 ml    Last Weight  Most recent update: 01/11/2021 11:52 AM    Weight  99.8 kg (220 lb)            Gen:  Caucasian F in NAD HEENT: Dry  mucous membranes CV: Regular rate and rhythm PULM: On RA ABD: soft/nontender EXT: No edema Neuro: Responds to name  SUMMARY OF RECOMMENDATIONS   DNAR/DNI  Patients father open to 3-7 day trial  of NGT feedings if needed though he would like to see her this evening to make a final decision  Appreciate TOC - MSW helping patients father navigate Disability & Medicaid application   Plan for long term - custodial care placement patient no longer advancing in therapy   Ongoing incremental palliative care support  Time Spent : 45 Greater than 50% of the time was spent in counseling and coordination of  care ______________________________________________________________________________________ Brenda Team Team Cell Phone: [phone] Please utilize secure chat with additional questions, if there is no response within 30 minutes please call the above phone number  Palliative Medicine Team providers are available by phone from 7am to 7pm daily and can be reached through the team cell phone.  Should this patient require assistance outside of these hours, please call the patient's attending physician.

## 2021-03-18 NOTE — Progress Notes (Signed)
Nutrition Follow-up  DOCUMENTATION CODES:  Obesity unspecified  INTERVENTION:  Please obtain updated weight  If aligned with goals of care, recommend initiation of enteral nutrition support as po intake remains inadequate to meet pt's needs. If/When feeding tube is placed and ready for use, recommend initiation of nocturnal tube feeding regimen to allow for PO intake during the day. Consider the following TF regimen: Osmolite 1.5 @ 75 mL/hr (to be administered over 10 hours from 1900-0500) with 45 mL Prosource TF (or equivalent) TID. Recommended nocturnal TF regimen would provide 1245 kcals, 80 grams of protein, 572m free water (meets 75% and 76% minimum estimated calorie and protein needs, respectively)  -d/c calorie count -d/c Glucerna shake given pt only accepting sips -Boost Breeze po TID WITH meals, each supplement provides 250 kcal and 9 grams of protein -Continue MVI with minerals daily  NUTRITION DIAGNOSIS:  Inadequate oral intake related to lethargy/confusion as evidenced by per patient/family report, meal completion < 50%. -- ongoing  GOAL:  Patient will meet greater than or equal to 90% of their needs -- not met   MONITOR:  PO intake, Supplement acceptance, Diet advancement, Labs, Weight trends, I & O's  REASON FOR ASSESSMENT:  Consult Calorie Count, Poor PO  ASSESSMENT:  55year old female past medical history of diabetes mellitus type 2, bipolar disorder type I, previous COVID infection, polysubstance abuse that includes cocaine.  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 [facility] is not eating or drinking sufficiently. Palliative care has been asked to get re-involved to further discuss goals of care in the setting of progressive failure to thrive and generalized weakness.  7/4 megace initiated  7/9 megace dose increased to BID   Per Palliative Care documentation, pt  indicated that she would not want a long-term feeding tube; however, pt's father stated that he would be interested in trial of tube feeding via NGT, but that he wanted to "give her a chance" to make improvements. Calorie count was initiated at that time. Calorie count ultimately demonstrated that pt is not eating/drinking enough to meet her nutritional needs. RD discussed pt with RN who reports pt has to essentially be force-fed and requires a lot of motivation/negotiation to accept POs. Today, pt consumed <50 kcals and <2g of protein for breakfast. Please refer to calorie count note from yesterday, 7/11, for additional information regarding the calorie count. RN informed RD that pt is accepting only sips of Glucerna. RD discussed above findings with MD.   At this time, RD will discontinue calorie count and Glucerna shakes. Will order Boost Breeze in hopes of increasing pt's acceptance/intake (plan to order WITH meals to align with current insulin coverage). Will monitor for decision regarding goals of care/plan of care (see above). If pt is to remain full scope of care, recommend placement of feeding tube for initiation of nutrition support. Difficult situation given pt likely more appropriate for PEG given reports of confusion x4 months and progressive cognitive and functional decline since admit, but pt already stated wishes for no long-term feeding tube.   Medications: SSI, lantus, megace, mvi with minerals Labs reviewed.  CBGs: [phone](MD aware and Diabetes Coordinator following; note Megace can contribute to uncontrolled CBGs) A1c 11.0 (taken 01/12/21)  UOP: 525m+ 2 unmeasured occurrences x24 hours Stool: 1x unmeasured occurrence x24 hours I/O: -2.8L since admit  New weight needs to be obtained as it has not been taken since 01/11/21  Diet Order:  Diet Order             DIET DYS 2 Room service appropriate? No; Fluid consistency: Thin  Diet effective now                  EDUCATION  NEEDS:  Education needs have been addressed  Skin:  Skin Assessment: Reviewed RN Assessment  Last BM:  7/11  Height:  Ht Readings from Last 1 Encounters:  03/15/21 5' 6"  (1.676 m)   Weight:  Wt Readings from Last 1 Encounters:  01/11/21 99.8 kg   Ideal Body Weight:  59.1 kg  BMI:  Body mass index is 35.51 kg/m.  Estimated Nutritional Needs:  Kcal:  1650-1850 Protein:  105-120 grams Fluid:  >1.65 L    Larkin Ina, MS, RD, LDN (she/her/hers) RD pager number and weekend/on-call pager number located in [place].

## 2021-03-18 NOTE — TOC Progression Note (Addendum)
Transition of Care [facility]) - Progression Note    Patient Details  Name: Carolyn Norton MRN: [id number] Date of Birth: February 18, 1966  [facility] CM/SW Contact  Janae Bridgeman, RN Phone Number: 03/18/2021, 2:26 PM  Clinical Narrative:    Case management spoke with Andria Frames, CM with [employer] counseling and the patient is being followed for disability determination through [facility] and Mertha Baars, [place] case worker - [phone] - pending Medicaid # [id number].  The patient has no available bed offers at this time.  I called and left a message with Malena Peer, CM at [place] an [facility] to check on availability of LTC beds for admission.  CM left a message with Malena Peer, CM at [place] and Elita Quick, CM at [employer] [facility] in [place], [place] at [phone].  CM and MSW with DTP Team continue to follow the patient for transitions of care needs.  03/18/2021  1452 - I spoke with the patient's mother on the phone and she is aware that the patient has poor po intake.  The patient's mother was made aware that the patient currently has no bed offers at this time - with barriers due to total care needs and pending Medicaid and disability at this time.     Expected Discharge Plan: Long Term Acute Care (LTAC) Barriers to Discharge: SNF Pending payor source - LOG  Expected Discharge Plan and Services Expected Discharge Plan: Long Term Acute Care (LTAC) In-house Referral: Clinical Social Work Discharge Planning Services: CM Consult Post Acute Care Choice: Skilled Nursing Facility Living arrangements for the past 2 months: Single Family Home Expected Discharge Date: 01/16/21               DME Arranged: [name] rolling DME Agency: [employer] Date DME Agency Contacted: 01/20/21 Time DME Agency Contacted: 1435 Representative spoke with at DME Agency: [name]             Social Determinants of Health (SDOH) Interventions     Readmission Risk Interventions Readmission Risk Prevention Plan 01/14/2021  Transportation Screening Complete  PCP or Specialist Appt within 3-5 Days (No Data)  Social Work Consult for Recovery Care Planning/Counseling Complete  Palliative Care Screening Not Applicable  Medication Review [employer]) Referral to Pharmacy  Some recent data might be hidden

## 2021-03-18 NOTE — Progress Notes (Addendum)
PROGRESS NOTE    Carolyn Norton   ZOX:[id number]  DOB: Feb 01, 1966  DOA: 01/11/2021 PCP: [name], FNP   Brief Narrative:  Carolyn Norton a 55 year old female with past medical history of diabetes mellitus type 2, bipolar disorder, cocaine abuse, cognitive impairment who lives with her parents and is taken care of by them. She was unable to give a history in the ED.  She presented from home with hyperglycemia (CBG 700s), steady cognitive and function decline at home  with inability to ambulate with multiple falls over the past 3 weeks. She had been confused for 4 months (since she had COVID). She had stopped taking her meds 3 days prior to coming in which appeared to be the reason her parents brought her. She was unable to ambulate in the ED and had hyperglycemia (CBG 729).  She was admitted to [facility].  Her hospital stay has been complicated by progressive cognitive and functional decline.  She has had psychiatry, palliative care, neurology and neurosurgery consults but no cause for her rapid cognitive decline has been found.  Subjective: No complaints.    Assessment & Plan:   Altered mental status, acute encephalopathy - On psychiatric eval on 5/14, she scored a 29 out of 30 on a Mini-Mental exam.  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- multiple LPs done -Neurosurgery was consulted on 6/5 and on 6/10 there was a plan to proceed with a lumbar drain trial however it was canceled as she was suspected to have a UTI on 6/9 and given Ceftriaxone- no culture - around 6/27 suspected again have a UTI contributing to another change in mental status- urine culture showed 60, 000 K coli and 10,000 K pneumnoiae- Given Cefazolin from 6/30-7/3 - Large-volume LP performed on 6/30-diagnosis of NPH was ruled out as she did not  have significant improvement-neurology noted that 'she needs outpatient neurosurgical eval for consideration of a VP shunt but it will not be meaningful until she has improvement in mentation and ambulation.' - neurology ultimately felt she had neurological issues are due to underlying dementia -Last neurology note from 7/2 recommends outpatient follow-up by neurology and neurosurgery - her exam remains unchanged day after day and her mother confirms this- she initially responds appropriately by saying she is doing well and asking how you are doing but then looks away does not respond much   Other active problems: Poor oral intake - weight on 5/7- 99.8 kg- weight today 67.7 kg - Calorie count started on 7/6 -  -Megace started on 03/11/2019 -IV fluids have been on hold and we are following her oral intake - 7/9- megace increased to BID -- 7/12> calorie count discontinued today- the patient continues to eat poorly and is taking in < 50% of meals  - I will dc Megace as it does not seem to have helped and it has caused hyperglycemia - I have touched base with palliative care to ask parents if they would like a feeding tube- appreciate assistance - cont to hold off on cor track and IVF - Nutrition has given recommendations on tube feeds in their notes     Acute respiratory distress > unilateral wheezing, increased respiratory rate and use of accessory muscles - 7/9 -Was being fed by her elderly mother on 7/8 while being somnolent-the nurse educated her not to  do this-suspicion that she may have aspirated-chest x-ray clear- no fevers noted- -Assessed by SLP and recommended D2 diet with thin liquids which is what she was previously on   Hypokalemia - likely related to poor oral intake - replacing as needed    Diabetes mellitus type 2 uncontrolled - Hemoglobin A1c is 11 on 5/8 - sugars quite uncontrolled since starting Megace - Lantus increased to 35 U for tonight (home dose was 45U) - Cont AC  and HS SSI and mealtime insulin - 7/11: Hypoglycemia> she did not > 50% of meals and was given 6 U of the insulin that was ordered to be given only if she ate > 50% - I have discontinued it.  - 7/12 stop Megace today- decrease lantus to 15 U today - Check A1c again tomorrow  COPD - no acute issues with this   E. coli and Klebsiella UTI- ~ 6/27 - Has been treated   Bipolar disorder - Lexapro, Depakote and gabapentin are home meds - psych eval 5/14- meds recommended by psych to be continued  - Depakote, Lexapro and Gabapentin were stopped on 6/29 due to lethargy   Severe lower extremity weakness (starting at least 1 month prior to admission) - I have reviewed the notes- per history obtained from her mother and father (by multiple sources), the patient had not been ambulating for weeks prior to presenting to the ED- Parents assisted her with ADLs at home.  -   first hospital PT eval on 5/8- she was not able to ambulate. - Now needs heavy 2+ assist to sit - cannot stand - I suspect severe deconditioning - MRI's cervical thoracic lumbar spine nonrevealing   - Has no been participating with PT this past week   History of polysubstance abuse Including cocaine and alcohol -On 2/15 she was cocaine and benzodiazepine positive    Time spent in minutes: 35 DVT prophylaxis: SCD's Start: 02/14/21 0829   Code Status: DNR Family Communication:  Mom   Level of Care: Level of care: Med-Surg Disposition Plan:  Status is: Inpatient  Remains inpatient appropriate because:Unsafe d/c plan  Dispo:  Patient From:  home with parents  Planned Disposition: SNF  Medically stable for discharge:         Antimicrobials:  Anti-infectives (From admission, onward)    Start     Dose/Rate Route Frequency Ordered Stop   03/06/21 0900  ceFAZolin (ANCEF) IVPB 1 g/50 mL premix        1 g 100 mL/hr over 30 Minutes Intravenous Every 8 hours 03/06/21 0801 03/10/21 0559   03/04/21 1430  cefTRIAXone  (ROCEPHIN) 1 g in sodium chloride 0.9 % 100 mL IVPB  Status:  Discontinued        1 g 200 mL/hr over 30 Minutes Intravenous Every 24 hours 03/04/21 1335 03/06/21 0800   02/14/21 0828  vancomycin (VANCOREADY) IVPB 1000 mg/200 mL  Status:  Discontinued        1,000 mg 200 mL/hr over 60 Minutes Intravenous 60 min pre-op 02/14/21 0828 03/02/21 1139   02/13/21 2115  cefTRIAXone (ROCEPHIN) 1 g in sodium chloride 0.9 % 100 mL IVPB  Status:  Discontinued        1 g 200 mL/hr over 30 Minutes Intravenous Every 24 hours 02/13/21 2016 02/16/21 0844        Objective: Vitals:   03/17/21 2124 03/18/21 0501 03/18/21 0802 03/18/21 1452  BP:  (!) 155/94  135/87  Pulse: (!) 101 95 (!) 103 92  Resp: 20 18 18    Temp:  97.9 F (36.6 C)  98.8 F (37.1 C)  TempSrc:  Oral    SpO2: 94% 92% 94% 98%  Weight:      Height:        Intake/Output Summary (Last 24 hours) at 03/18/2021 1602 Last data filed at 03/18/2021 05/19/2021 Gross per 24 hour  Intake 237 ml  Output 50 ml  Net 187 ml   Filed Weights   01/11/21 1152  Weight: 99.8 kg    Examination: Exam unchanged from yesterday General exam: Appears comfortable - hands in mitts today HEENT: PERRLA, oral mucosa moist, no sclera icterus or thrush Respiratory system: Clear to auscultation. Respiratory effort normal. Cardiovascular system: S1 & S2 heard, regular rate and rhythm Gastrointestinal system: Abdomen soft, non-tender, nondistended. Normal bowel sounds   Central nervous system: Alert and oriented only to person - does not follow commands for me Extremities: No cyanosis, clubbing or edema Skin: No rashes or ulcers Psychiatry:  poor attention      Data Reviewed: I have personally reviewed following labs and imaging studies  CBC: Recent Labs  Lab 03/13/21 0911 03/17/21 0218  WBC 9.3 11.3*  HGB 12.6 12.8  HCT 37.7 38.6  MCV 87.7 87.5  PLT 409* 417*   Basic Metabolic Panel: Recent Labs  Lab 03/13/21 0911 03/14/21 0049 03/16/21 2207  03/17/21 0218 03/18/21 0258  NA 139 140 130* 138 137  K 3.4* 3.6 4.8 3.3* 3.5  CL 103 106 97* 102 101  CO2 26 29 25 25 25   GLUCOSE 239* 138* 680* 292* 121*  BUN 9 15 24* 21* 20  CREATININE 0.98 0.97 1.27* 1.09* 0.97  CALCIUM 9.5 9.4 9.3 10.0 9.9  MG  --   --   --  1.7  --    GFR: Estimated Creatinine Clearance: 79.9 mL/min (by C-G formula based on SCr of 0.97 mg/dL). Liver Function Tests: No results for input(s): AST, ALT, ALKPHOS, BILITOT, PROT, ALBUMIN in the last 168 hours. No results for input(s): LIPASE, AMYLASE in the last 168 hours. No results for input(s): AMMONIA in the last 168 hours. Coagulation Profile: No results for input(s): INR, PROTIME in the last 168 hours. Cardiac Enzymes: No results for input(s): CKTOTAL, CKMB, CKMBINDEX, TROPONINI in the last 168 hours. BNP (last 3 results) No results for input(s): PROBNP in the last 8760 hours. HbA1C: No results for input(s): HGBA1C in the last 72 hours. CBG: Recent Labs  Lab 03/17/21 1621 03/17/21 1710 03/17/21 1921 03/18/21 0742 03/18/21 1206  GLUCAP 49* 126* 238* 139* 197*   Lipid Profile: No results for input(s): CHOL, HDL, LDLCALC, TRIG, CHOLHDL, LDLDIRECT in the last 72 hours. Thyroid Function Tests: No results for input(s): TSH, T4TOTAL, FREET4, T3FREE, THYROIDAB in the last 72 hours. Anemia Panel: No results for input(s): VITAMINB12, FOLATE, FERRITIN, TIBC, IRON, RETICCTPCT in the last 72 hours. Urine analysis:    Component Value Date/Time   COLORURINE YELLOW 03/03/2021 0810   APPEARANCEUR CLEAR 03/03/2021 0810   LABSPEC 1.022 03/03/2021 0810   PHURINE 5.0 03/03/2021 0810   GLUCOSEU >=500 (A) 03/03/2021 0810   HGBUR NEGATIVE 03/03/2021 0810   BILIRUBINUR NEGATIVE 03/03/2021 0810   KETONESUR 80 (A) 03/03/2021 0810   PROTEINUR NEGATIVE 03/03/2021 0810   UROBILINOGEN 0.2 07/20/2014 2022   NITRITE NEGATIVE 03/03/2021 0810   LEUKOCYTESUR SMALL (A) 03/03/2021 0810   Sepsis  Labs: @LABRCNTIP (procalcitonin:4,lacticidven:4) ) No results found for this or any previous visit (from the past 240 hour(s)).  Radiology Studies: No results found.    Scheduled Meds:  enoxaparin (LOVENOX) injection  0.5 mg/kg Subcutaneous Q24H   feeding supplement  1 Container Oral TID WC   insulin aspart  0-5 Units Subcutaneous QHS   insulin aspart  0-9 Units Subcutaneous TID WC   insulin glargine  15 Units Subcutaneous QHS   multivitamin with minerals  1 tablet Oral Daily   Continuous Infusions:   LOS: 65 days      Calvert CantorSaima Athziry Millican, MD [facility] Pager: [email or web address] 03/18/2021, 4:02 PM

## 2021-03-19 DIAGNOSIS — I10 Essential (primary) hypertension: Secondary | ICD-10-CM

## 2021-03-19 DIAGNOSIS — E1165 Type 2 diabetes mellitus with hyperglycemia: Secondary | ICD-10-CM

## 2021-03-19 DIAGNOSIS — Z794 Long term (current) use of insulin: Secondary | ICD-10-CM

## 2021-03-19 LAB — GLUCOSE, CAPILLARY
Glucose-Capillary: 154 mg/dL — ABNORMAL HIGH (ref 70–99)
Glucose-Capillary: 463 mg/dL — ABNORMAL HIGH (ref 70–99)
Glucose-Capillary: 227 mg/dL — ABNORMAL HIGH (ref 70–99)
Glucose-Capillary: 301 mg/dL — ABNORMAL HIGH (ref 70–99)

## 2021-03-19 MED ORDER — INSULIN ASPART 100 UNIT/ML IJ SOLN
12.0000 [IU] | Freq: Once | INTRAMUSCULAR | Status: AC
  Administered 2021-03-19: 12 [IU] via SUBCUTANEOUS

## 2021-03-19 NOTE — Progress Notes (Signed)
03/19/21 1300  OT Visit Information  Last OT Received On 03/19/21  Assistance Needed +2  History of Present Illness Pt is a 55 y.o. female admitted 01/11/21 with multiple falls, AMS, weakness. Workup for severe dehydration, DKA, metabolic encephalopathy. 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 Neurosurgery following for possible lumbar drain trial vs repeat LP vs VP shunt placement. Placement of lumbar drain on 6/10 cancelled due to UTI.7/1 underwent lumbar puncture and drain large volume of CSF to assess for potential improvement in symptomology (none noted);  7/2 MRI of the brain, cervical, thoracic and lumbar spine with no acute changes to explain LE weakness   PMH includes DM2, COPD, CKD3, GERD, bipolar disorder, COVID-19, polysubstance abuse including cocaine.  Precautions  Precautions Fall  Pain Assessment  Pain Assessment No/denies pain  Faces Pain Scale 0  Cognition  Arousal/Alertness Awake/alert  Behavior During Therapy Flat affect  Overall Cognitive Status Difficult to assess  Area of Impairment Attention;Following commands;Problem solving  Orientation Level  (pt not responding verbally)  Current Attention Level Focused  Problem Solving Slow processing;Difficulty sequencing;Requires verbal cues;Decreased initiation;Requires tactile cues  General Comments wide awake, says "hello" in response to greeting and "okay" when asked how she is doing, but no other verbalizations able to be elicited throughout session. Patient maintains blank stare.  Difficult to assess due to Impaired communication  Bed Mobility  Overal bed mobility Needs Assistance  General bed mobility comments Heavy total A +2 for all aspects of bed mobility.  Balance  Overall balance assessment Needs assistance;History of Falls  Sitting-balance support Feet unsupported;Bilateral upper extremity supported  Sitting balance-Leahy Scale Zero  Sitting balance - Comments heavy posterior lean  requiring totalAx2 to maintain upright  Postural control Posterior lean  Standing balance support During functional activity;Bilateral upper extremity supported  Standing balance-Leahy Scale Zero  Standing balance comment BUE support needed to static stand in stedy  Restrictions  Weight Bearing Restrictions No  Transfers  General transfer comment Deferred  General Comments  General comments (skin integrity, edema, etc.) Patient met lying supine in bed. Initially says "hello" in response to this therapists greeting, states name and DOB when asked but does not make any other attempts to verbalize or respond to this therapists commands for active participation in OT treatment session. Patient remains grossly Total A to Total A +2 for all bed mobility, self-care tasks and functional transfers via mechanical lift. Patient no longer making progress toward goals and will likely remain Total A +2. D/c disposition updated to long-term/custodial care, goals discontinued and OT to sign off at this time.  OT - End of Session  Activity Tolerance Other (comment) (Limited participation)  Patient left in bed;with bed alarm set;with call bell/phone within reach  Nurse Communication Other (comment) (OT to sign off at this time.)  OT Assessment/Plan  OT Plan Discharge plan needs to be updated  OT Visit Diagnosis Unsteadiness on feet (R26.81);Other abnormalities of gait and mobility (R26.89);Muscle weakness (generalized) (M62.81);Other symptoms and signs involving cognitive function;Other symptoms and signs involving the nervous system (R29.898)  Follow Up Recommendations Other (comment) (Long-term/custodial care)  OT Equipment Other (comment) (Defer to next level of care.)  AM-PAC OT "6 Clicks" Daily Activity Outcome Measure (Version 2)  Help from another person eating meals? 1  Help from another person taking care of personal grooming? 1  Help from another person toileting, which includes using toliet,  bedpan, or urinal? 1  Help from another person bathing (including washing, rinsing,  drying)? 1  Help from another person to put on and taking off regular upper body clothing? 1  Help from another person to put on and taking off regular lower body clothing? 1  6 Click Score 6  Acute Rehab OT Goals  Patient Stated Goal None stated.  OT Goal Formulation Patient unable to participate in goal setting  Time For Goal Achievement 03/22/21  Potential to Achieve Goals Poor  OT Time Calculation  OT Start Time (ACUTE ONLY) 1341  OT Stop Time (ACUTE ONLY) 1352  OT Time Calculation (min) 11 min  OT General Charges  $OT Visit 1 Visit  OT Treatments  $Therapeutic Activity 8-22 mins

## 2021-03-19 NOTE — Progress Notes (Signed)
Pt yelled out when flushing IV. IV removed. MD made aware and ok with no IV access.

## 2021-03-19 NOTE — Progress Notes (Signed)
Inpatient Diabetes Program Recommendations  AACE/ADA: New Consensus Statement on Inpatient Glycemic Control (2015)  Target Ranges:  Prepandial:   less than 140 mg/dL      Peak postprandial:   less than 180 mg/dL (1-2 hours)      Critically ill patients:  140 - 180 mg/dL   Lab Results  Component Value Date   GLUCAP 463 (H) 03/19/2021   HGBA1C 11.0 (H) 01/12/2021    Review of Glycemic Control Results for Carolyn Norton, Carolyn Norton (MRN [id number]) as of 03/19/2021 15:22  Ref. Range 03/18/2021 12:06 03/18/2021 16:59 03/18/2021 20:16 03/19/2021 07:39 03/19/2021 11:57  Glucose-Capillary Latest Ref Range: 70 - 99 mg/dL 017 (H) 494 (H) 496 (H) 154 (H) 463 (H)    Inpatient Diabetes Program Recommendations:   Postprandial CBG 463. Please consider: -Novolog 4 units tid meal coverage if eats 50%. Secure chat sent to Dr. Rito Ehrlich.  Thank you, Billy Fischer. Terry Abila, RN, MSN, CDE  Diabetes Coordinator Inpatient Glycemic Control Team Team Pager [phone] (8am-5pm) 03/19/2021 3:23 PM

## 2021-03-19 NOTE — Progress Notes (Signed)
  Speech Language Pathology Treatment: Dysphagia  Patient Details Name: Carolyn Norton MRN: [id number] DOB: [birth year] Today's Date: 03/19/2021 Time: 8299-3716 SLP Time Calculation (min) (ACUTE ONLY): 22 min  Assessment / Plan / Recommendation Clinical Impression  Pt demonstrated good intake of breakfast this am.  Much of her breakfast tray had been eaten, and while I was in the room she ate 100% of her Magic Cup and drank 4 oz of ginger ale (Percentages were recorded and placed in calorie count envelope on door).  Primary barrier to eating is mentation and initiation.  Swallow is functional per chart review and MBS performed last week. Pt was able to feed herself but required max tactile/verbal cues to initiate and sustain the motor responses required for self-feeding. This was true as well for verbal communication - max cues needed to respond to basic questions or choose from two options. Due to prolonged mastication, recommend maintaining chopped diet of dysphagia 2, thin liquids.  Please provide 1:1 assist with meals to maximize Ms. Leavitt's intake.  No further SLP intervention is warranted at this time.  Our service will sign off.   HPI HPI: 55 year old female past medical history of diabetes mellitus type 2, bipolar disorder type I, previous COVID infection, polysubstance abuse that includes cocaine.  She presented from home where she lives with her parents.  Her presenting symptoms included generalized weakness, multiple falls and confusion. Has been seen by SLP in prior admissions for poor intake or vague report of dysphagia, though pt has always presented with normal swallowing, but impaired cognition. Neurosurgery following for possible lumbar drain trial vs repeat LP vs VP shunt placement. Placement of lumbar drain on 6/10 cancelled due to UTI.7/1 underwent lumbar puncture and drain large volume of CSF to assess for potential improvement in symptomology (none noted).      SLP Plan   All goals met       Recommendations  Diet recommendations: Dysphagia 2 (fine chop);Thin liquid Liquids provided via: Cup;Straw Medication Administration: Crushed with puree Supervision: Trained caregiver to feed patient;Staff to assist with self feeding Compensations: Minimize environmental distractions                Oral Care Recommendations: Oral care BID Follow up Recommendations: 24 hour supervision/assistance SLP Visit Diagnosis: Dysphagia, oropharyngeal phase (R13.12) Plan: All goals met       GO              Izik Bingman L. Tivis Ringer, [place] CCC/SLP Acute Rehabilitation Services Office number [phone] Pager [phone]   Assunta Curtis 03/19/2021, 12:44 PM

## 2021-03-19 NOTE — Progress Notes (Signed)
PROGRESS NOTE    Carolyn Norton   ZOX:[id number]  DOB: Apr 07, 1966  DOA: 01/11/2021 PCP: Dartha LodgeSteele, Anthony, FNP   Brief Narrative:  Carolyn PerchesMichelle Lee Stanleyis a 55 year old female with past medical history of diabetes mellitus type 2, bipolar disorder, cocaine abuse, cognitive impairment who lives with her parents and is taken care of by them. She was unable to give a history in the ED.  She presented from home with hyperglycemia (CBG 700s), steady cognitive and function decline at home  with inability to ambulate with multiple falls over the past 3 weeks. She had been confused for 4 months (since she had COVID). She had stopped taking her meds 3 days prior to coming in which appeared to be the reason her parents brought her. She was unable to ambulate in the ED and had hyperglycemia (CBG 729).  [facility]  Her hospital stay has been complicated by progressive cognitive and functional decline.  She has had psychiatry, palliative care, neurology and neurosurgery consults but no cause for her rapid cognitive decline has been found.  At this point, PT has signed off not recommending her for SNF, but for some kind if long term care.  In discussion with patient's father, no plans for long-term feeding tube, but wants to try NG tube   Assessment & Plan:   Altered mental status, acute encephalopathy - On psychiatric eval on 5/14, she scored a 29 out of 30 on a Mini-Mental exam.  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- multiple LPs done -Neurosurgery was consulted on 6/5 and on 6/10 there was a plan to proceed with a lumbar drain trial however it was canceled as she was suspected to have a UTI on 6/9 and given Ceftriaxone- no culture - around 6/27 suspected again have a UTI contributing to another change in mental status- urine  culture showed 60, 000 K coli and 10,000 K pneumnoiae- Given Cefazolin from 6/30-7/3 - Large-volume LP performed on 6/30-diagnosis of NPH was ruled out as she did not have significant improvement-neurology noted that 'she needs outpatient neurosurgical eval for consideration of a VP shunt but it will not be meaningful until she has improvement in mentation and ambulation.' - neurology ultimately felt she had neurological issues are due to underlying dementia -Last neurology note from 7/2 recommends outpatient follow-up by neurology and neurosurgery - her exam remains unchanged day after day and her mother confirms this- she initially responds appropriately by saying she is doing well and asking how you are doing but then looks away does not respond much   Other active problems: Poor oral intake - weight on 5/7- 99.8 kg- weight today 67.7 kg - Calorie count started on 7/6 -  -Megace started on 03/11/2019 -IV fluids have been on hold and we are following her oral intake - 7/9- megace increased to BID -- 7/12> calorie count discontinued today- the patient continues to eat poorly and is taking in < 50% of meals  - I will dc Megace as it does not seem to have helped and it has caused hyperglycemia - I have touched base with palliative care to ask parents if they would like a feeding tube- appreciate assistance Apparently is patient's p.o. intake is slightly improved today.  If this continues, we will hold off on NG tube for feeding.  Otherwise  we will plan to place tomorrow  Acute respiratory distress > unilateral wheezing, increased respiratory rate and use of accessory muscles - 7/9 -Was being fed by her elderly mother on 7/8 while being somnolent-the nurse educated her not to do this-suspicion that she may have aspirated-chest x-ray clear- no fevers noted- -Assessed by SLP and recommended D2 diet with thin liquids which is what she was previously on   Hypokalemia - likely related to poor oral  intake - replacing as needed   Diabetes mellitus type 2 uncontrolled - Hemoglobin A1c is 11 on 5/8 - sugars quite uncontrolled since starting Megace - Lantus increased to 35 U for tonight (home dose was 45U) - Cont AC and HS SSI and mealtime insulin - 7/11: Hypoglycemia> she did not > 50% of meals and was given 6 U of the insulin that was ordered to be given only if she ate > 50% - I have discontinued it.  - 7/12 stop Megace - decrease lantus to 15 U  COPD - no acute issues with this   E. coli and Klebsiella UTI- ~ 6/27 - Has been treated   Bipolar disorder - Lexapro, Depakote and gabapentin are home meds - psych eval 5/14- meds recommended by psych to be continued  - Depakote, Lexapro and Gabapentin were stopped on 6/29 due to lethargy   Severe lower extremity weakness (starting at least 1 month prior to admission) - I have reviewed the notes- per history obtained from her mother and father (by multiple sources), the patient had not been ambulating for weeks prior to presenting to the ED- Parents assisted her with ADLs at home.  -   first hospital PT eval on 5/8- she was not able to ambulate. - Now needs heavy 2+ assist to sit - cannot stand - I suspect severe deconditioning - MRI's cervical thoracic lumbar spine nonrevealing   - Has no been participating with PT this past week, and they have since signed off on her.   History of polysubstance abuse Including cocaine and alcohol -On 2/15 she was cocaine and benzodiazepine positive    DVT prophylaxis: SCD's Start: 02/14/21 7681   Code Status: DNR Family Communication: Updated father by phone Level of Care: Level of care: Med-Surg Disposition Plan:  Status is: Inpatient  Remains inpatient appropriate because:Unsafe d/c plan  Dispo:  Patient From:  home with parents  Planned Disposition: Long-term care facility.  Medically stable for discharge: No       Antimicrobials:  Anti-infectives (From admission, onward)     Start     Dose/Rate Route Frequency Ordered Stop   03/06/21 0900  ceFAZolin (ANCEF) IVPB 1 g/50 mL premix        1 g 100 mL/hr over 30 Minutes Intravenous Every 8 hours 03/06/21 0801 03/10/21 0559   03/04/21 1430  cefTRIAXone (ROCEPHIN) 1 g in sodium chloride 0.9 % 100 mL IVPB  Status:  Discontinued        1 g 200 mL/hr over 30 Minutes Intravenous Every 24 hours 03/04/21 1335 03/06/21 0800   02/14/21 0828  vancomycin (VANCOREADY) IVPB 1000 mg/200 mL  Status:  Discontinued        1,000 mg 200 mL/hr over 60 Minutes Intravenous 60 min pre-op 02/14/21 0828 03/02/21 1139   02/13/21 2115  cefTRIAXone (ROCEPHIN) 1 g in sodium chloride 0.9 % 100 mL IVPB  Status:  Discontinued        1 g 200 mL/hr over 30 Minutes Intravenous Every 24 hours 02/13/21 2016  02/16/21 0844        Objective: Vitals:   03/18/21 0802 03/18/21 1452 03/18/21 2014 03/19/21 0515  BP:  135/87 (!) 152/97 (!) 142/82  Pulse: (!) 103 92 94 87  Resp: 18  20 20   Temp:  98.8 F (37.1 C) 97.7 F (36.5 C) 98.1 F (36.7 C)  TempSrc:   Oral Axillary  SpO2: 94% 98% 95% 95%  Weight:      Height:        Intake/Output Summary (Last 24 hours) at 03/19/2021 1612 Last data filed at 03/19/2021 0308 Gross per 24 hour  Intake --  Output 500 ml  Net -500 ml    Filed Weights   01/11/21 1152  Weight: 99.8 kg    Examination:  General exam: Alert and oriented x1, does not follow commands HEENT: Normocephalic, atraumatic, mucous membranes slightly dry Respiratory system: Clear to auscultation.  No labored breathing Cardiovascular system: Regular rate and rhythm, S1-S2 Gastrointestinal system: Soft, nontender, nondistended, positive bowel sounds Central nervous system: No focal deficits Extremities: No cyanosis, clubbing or edema Skin: No skin breaks, tears or lesions Psychiatry: Now more of a chronic cognitive decline      Data Reviewed: I have personally reviewed following labs and imaging studies  CBC: Recent Labs   Lab 03/13/21 0911 03/17/21 0218  WBC 9.3 11.3*  HGB 12.6 12.8  HCT 37.7 38.6  MCV 87.7 87.5  PLT 409* 417*    Basic Metabolic Panel: Recent Labs  Lab 03/13/21 0911 03/14/21 0049 03/16/21 2207 03/17/21 0218 03/18/21 0258  NA 139 140 130* 138 137  K 3.4* 3.6 4.8 3.3* 3.5  CL 103 106 97* 102 101  CO2 26 29 25 25 25   GLUCOSE 239* 138* 680* 292* 121*  BUN 9 15 24* 21* 20  CREATININE 0.98 0.97 1.27* 1.09* 0.97  CALCIUM 9.5 9.4 9.3 10.0 9.9  MG  --   --   --  1.7  --     GFR: Estimated Creatinine Clearance: 79.9 mL/min (by C-G formula based on SCr of 0.97 mg/dL). Liver Function Tests: No results for input(s): AST, ALT, ALKPHOS, BILITOT, PROT, ALBUMIN in the last 168 hours. No results for input(s): LIPASE, AMYLASE in the last 168 hours. No results for input(s): AMMONIA in the last 168 hours. Coagulation Profile: No results for input(s): INR, PROTIME in the last 168 hours. Cardiac Enzymes: No results for input(s): CKTOTAL, CKMB, CKMBINDEX, TROPONINI in the last 168 hours. BNP (last 3 results) No results for input(s): PROBNP in the last 8760 hours. HbA1C: No results for input(s): HGBA1C in the last 72 hours. CBG: Recent Labs  Lab 03/18/21 1659 03/18/21 2016 03/19/21 0739 03/19/21 1157 03/19/21 1603  GLUCAP 121* 209* 154* 463* 227*    Lipid Profile: No results for input(s): CHOL, HDL, LDLCALC, TRIG, CHOLHDL, LDLDIRECT in the last 72 hours. Thyroid Function Tests: No results for input(s): TSH, T4TOTAL, FREET4, T3FREE, THYROIDAB in the last 72 hours. Anemia Panel: No results for input(s): VITAMINB12, FOLATE, FERRITIN, TIBC, IRON, RETICCTPCT in the last 72 hours. Urine analysis:    Component Value Date/Time   COLORURINE YELLOW 03/03/2021 0810   APPEARANCEUR CLEAR 03/03/2021 0810   LABSPEC 1.022 03/03/2021 0810   PHURINE 5.0 03/03/2021 0810   GLUCOSEU >=500 (A) 03/03/2021 0810   HGBUR NEGATIVE 03/03/2021 0810   BILIRUBINUR NEGATIVE 03/03/2021 0810   KETONESUR  80 (A) 03/03/2021 0810   PROTEINUR NEGATIVE 03/03/2021 0810   UROBILINOGEN 0.2 07/20/2014 2022   NITRITE NEGATIVE 03/03/2021 0810  LEUKOCYTESUR SMALL (A) 03/03/2021 0810   Sepsis Labs: @LABRCNTIP (procalcitonin:4,lacticidven:4) ) No results found for this or any previous visit (from the past 240 hour(s)).        Radiology Studies: No results found.    Scheduled Meds:  enoxaparin (LOVENOX) injection  0.5 mg/kg Subcutaneous Q24H   feeding supplement  1 Container Oral TID WC   insulin aspart  0-5 Units Subcutaneous QHS   insulin aspart  0-9 Units Subcutaneous TID WC   insulin glargine  15 Units Subcutaneous QHS   multivitamin with minerals  1 tablet Oral Daily   Continuous Infusions:   LOS: 66 days      , MD [facility] Pager: [email or web address] 03/19/2021, 4:12 PM

## 2021-03-19 NOTE — Progress Notes (Signed)
Lunch CBG 463, MD made aware. New order to give one time dose of 12U novolog.

## 2021-03-20 ENCOUNTER — Inpatient Hospital Stay (HOSPITAL_COMMUNITY): Payer: Medicaid Other

## 2021-03-20 LAB — CBC
HCT: 40.3 % (ref 36.0–46.0)
Hemoglobin: 13.6 g/dL (ref 12.0–15.0)
MCH: 29.6 pg (ref 26.0–34.0)
MCHC: 33.7 g/dL (ref 30.0–36.0)
MCV: 87.8 fL (ref 80.0–100.0)
Platelets: 377 10*3/uL (ref 150–400)
RBC: 4.59 MIL/uL (ref 3.87–5.11)
RDW: 14.9 % (ref 11.5–15.5)
WBC: 9.1 10*3/uL (ref 4.0–10.5)
nRBC: 0 % (ref 0.0–0.2)

## 2021-03-20 LAB — HEMOGLOBIN A1C
Hgb A1c MFr Bld: 8.8 % — ABNORMAL HIGH (ref 4.8–5.6)
Mean Plasma Glucose: 205.86 mg/dL

## 2021-03-20 LAB — GLUCOSE, CAPILLARY
Glucose-Capillary: 190 mg/dL — ABNORMAL HIGH (ref 70–99)
Glucose-Capillary: 318 mg/dL — ABNORMAL HIGH (ref 70–99)
Glucose-Capillary: 477 mg/dL — ABNORMAL HIGH (ref 70–99)
Glucose-Capillary: 230 mg/dL — ABNORMAL HIGH (ref 70–99)

## 2021-03-20 MED ORDER — INSULIN ASPART 100 UNIT/ML IJ SOLN
0.0000 [IU] | Freq: Every day | INTRAMUSCULAR | Status: DC
  Administered 2021-03-20: 2 [IU] via SUBCUTANEOUS
  Administered 2021-03-21: 5 [IU] via SUBCUTANEOUS

## 2021-03-20 MED ORDER — INSULIN ASPART 100 UNIT/ML IJ SOLN
10.0000 [IU] | Freq: Once | INTRAMUSCULAR | Status: AC
  Administered 2021-03-20: 10 [IU] via SUBCUTANEOUS

## 2021-03-20 MED ORDER — INSULIN GLARGINE 100 UNIT/ML ~~LOC~~ SOLN
25.0000 [IU] | Freq: Every day | SUBCUTANEOUS | Status: DC
  Administered 2021-03-20 – 2021-03-21 (×2): 25 [IU] via SUBCUTANEOUS
  Filled 2021-03-20 (×3): qty 0.25

## 2021-03-20 MED ORDER — INSULIN ASPART 100 UNIT/ML IJ SOLN
0.0000 [IU] | Freq: Three times a day (TID) | INTRAMUSCULAR | Status: DC
  Administered 2021-03-21 (×2): 11 [IU] via SUBCUTANEOUS
  Administered 2021-03-22: 20 [IU] via SUBCUTANEOUS
  Administered 2021-03-22: 7 [IU] via SUBCUTANEOUS

## 2021-03-20 NOTE — Plan of Care (Signed)

## 2021-03-20 NOTE — Progress Notes (Signed)
Pt. CBG 477, MD made aware per order and said to give additional l 10 units of Novolog along with SSI, total of 19.

## 2021-03-20 NOTE — Progress Notes (Signed)
PROGRESS NOTE    Carolyn Norton   TUU:[id number]  DOB: September 30, 1965  DOA: 01/11/2021 PCP: [name], FNP   Brief Narrative:  Aruna Nestler a 55 year old female with past medical history of diabetes mellitus type 2, bipolar disorder, cocaine abuse, cognitive impairment who lives with her parents and is taken care of by them. She was unable to give a history in the ED.  She presented from home with hyperglycemia (CBG 700s), steady cognitive and function decline at home  with inability to ambulate with multiple falls over the past 3 weeks. She had been confused for 4 months (since she had COVID). She had stopped taking her meds 3 days prior to coming in which appeared to be the reason her parents brought her. She was unable to ambulate in the ED and had hyperglycemia (CBG 729).  She was admitted to [facility].  Her hospital stay has been complicated by progressive cognitive and functional decline.  She has had psychiatry, palliative care, neurology and neurosurgery consults but no cause for her rapid cognitive decline has been found.  At this point, PT has signed off not recommending her for SNF, but for some kind if long term care.  In discussion with patient's father, no plans for long-term feeding tube, but wants to try NG tube.  Patient about the same today.  CBGs have been more increased in the last 24 hours.   Assessment & Plan:   Altered mental status, acute encephalopathy - On psychiatric eval on 5/14, she scored a 29 out of 30 on a Mini-Mental exam.  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- multiple LPs done -Neurosurgery was consulted on 6/5 and on 6/10 there was a plan to proceed with a lumbar drain trial however it was canceled as she was suspected to have a UTI on 6/9 and given Ceftriaxone- no culture - around  6/27 suspected again have a UTI contributing to another change in mental status- urine culture showed 60, 000 K coli and 10,000 K pneumnoiae- Given Cefazolin from 6/30-7/3 - Large-volume LP performed on 6/30-diagnosis of NPH was ruled out as she did not have significant improvement-neurology noted that 'she needs outpatient neurosurgical eval for consideration of a VP shunt but it will not be meaningful until she has improvement in mentation and ambulation.' - neurology ultimately felt she had neurological issues are due to underlying dementia -Last neurology note from 7/2 recommends outpatient follow-up by neurology and neurosurgery - her exam remains unchanged day after day and her mother confirms this-did not respond much today.   Other active problems: Poor oral intake - weight on 5/7- 99.8 kg- weight today 67.7 kg - Calorie count started on 7/6 -  -Megace started on 03/11/2019 -IV fluids have been on hold and we are following her oral intake - 7/9- megace increased to BID, but discontinued by 5/12 as it does not seem to help and is causing hyperglycemia. -- 7/12> calorie count discontinued  the patient continues to eat poorly and is taking in < 50% of meals  - I have touched base with palliative care to ask parents if they would like a feeding tube- appreciate assistance As per family wishes, have ordered NG tube for placement as of 7/14  Acute respiratory distress > unilateral wheezing, increased respiratory rate and use of accessory muscles - 7/9 -  Was being fed by her elderly mother on 7/8 while being somnolent-the nurse educated her not to do this-suspicion that she may have aspirated-chest x-ray clear- no fevers noted- -Assessed by SLP and recommended D2 diet with thin liquids which is what she was previously on   Hypokalemia - likely related to poor oral intake - replacing as needed   Diabetes mellitus type 2 uncontrolled - Hemoglobin A1c is 11 on 5/8 - sugars quite uncontrolled  since starting Megace - Lantus increased to 35 U for tonight (home dose was 45U) - Cont AC and HS SSI and mealtime insulin - 7/11: Hypoglycemia> she did not > 50% of meals and was given 6 U of the insulin that was ordered to be given only if she ate > 50% - I have discontinued it.  - 7/12 stop Megace - decrease lantus to 15 U  COPD - no acute issues with this   E. coli and Klebsiella UTI- ~ 6/27 - Has been treated   Bipolar disorder - Lexapro, Depakote and gabapentin are home meds - psych eval 5/14- meds recommended by psych to be continued  - Depakote, Lexapro and Gabapentin were stopped on 6/29 due to lethargy   Severe lower extremity weakness (starting at least 1 month prior to admission) - I have reviewed the notes- per history obtained from her mother and father (by multiple sources), the patient had not been ambulating for weeks prior to presenting to the ED- Parents assisted her with ADLs at home.  -   first hospital PT eval on 5/8- she was not able to ambulate. - Now needs heavy 2+ assist to sit - cannot stand - I suspect severe deconditioning - MRI's cervical thoracic lumbar spine nonrevealing   - Has no been participating with PT this past week, and they have since signed off on her.   History of polysubstance abuse Including cocaine and alcohol -On 2/15 she was cocaine and benzodiazepine positive    DVT prophylaxis: SCD's Start: 02/14/21 16100829   Code Status: DNR Family Communication: Updated father by phone Level of Care: Level of care: Med-Surg Disposition Plan:  Status is: Inpatient  Remains inpatient appropriate because:Unsafe d/c plan  Dispo:  Patient From:  home with parents  Planned Disposition: Long-term care facility.  Medically stable for discharge: No       Antimicrobials:  Anti-infectives (From admission, onward)    Start     Dose/Rate Route Frequency Ordered Stop   03/06/21 0900  ceFAZolin (ANCEF) IVPB 1 g/50 mL premix        1 g 100 mL/hr  over 30 Minutes Intravenous Every 8 hours 03/06/21 0801 03/10/21 0559   03/04/21 1430  cefTRIAXone (ROCEPHIN) 1 g in sodium chloride 0.9 % 100 mL IVPB  Status:  Discontinued        1 g 200 mL/hr over 30 Minutes Intravenous Every 24 hours 03/04/21 1335 03/06/21 0800   02/14/21 0828  vancomycin (VANCOREADY) IVPB 1000 mg/200 mL  Status:  Discontinued        1,000 mg 200 mL/hr over 60 Minutes Intravenous 60 min pre-op 02/14/21 0828 03/02/21 1139   02/13/21 2115  cefTRIAXone (ROCEPHIN) 1 g in sodium chloride 0.9 % 100 mL IVPB  Status:  Discontinued        1 g 200 mL/hr over 30 Minutes Intravenous Every 24 hours 02/13/21 2016 02/16/21 0844        Objective: Vitals:   03/19/21 0515 03/19/21 2056 03/20/21 0604 03/20/21 1325  BP: Marland Kitchen(!)  142/82 (!) 151/75 (!) 160/97 139/79  Pulse: 87 92 96 91  Resp: 20 16 14 18   Temp: 98.1 F (36.7 C) 97.8 F (36.6 C) 99 F (37.2 C) 98.6 F (37 C)  TempSrc: Axillary     SpO2: 95%   97%  Weight:      Height:        Intake/Output Summary (Last 24 hours) at 03/20/2021 1627 Last data filed at 03/19/2021 2059 Gross per 24 hour  Intake --  Output 500 ml  Net -500 ml    Filed Weights   01/11/21 1152  Weight: 99.8 kg    Examination:  General exam: Alert and oriented x1, does not follow commands HEENT: Normocephalic, atraumatic, mucous membranes slightly dry Respiratory system: Clear to auscultation.  No labored breathing Cardiovascular system: Regular rate and rhythm, S1-S2 Gastrointestinal system: Soft, nontender, nondistended, positive bowel sounds Central nervous system: No focal deficits Extremities: No cyanosis, clubbing or edema Skin: No skin breaks, tears or lesions Psychiatry: Now more of a chronic cognitive decline      Data Reviewed: I have personally reviewed following labs and imaging studies  CBC: Recent Labs  Lab 03/17/21 0218 03/20/21 0215  WBC 11.3* 9.1  HGB 12.8 13.6  HCT 38.6 40.3  MCV 87.5 87.8  PLT 417* 377     Basic Metabolic Panel: Recent Labs  Lab 03/14/21 0049 03/16/21 2207 03/17/21 0218 03/18/21 0258  NA 140 130* 138 137  K 3.6 4.8 3.3* 3.5  CL 106 97* 102 101  CO2 29 25 25 25   GLUCOSE 138* 680* 292* 121*  BUN 15 24* 21* 20  CREATININE 0.97 1.27* 1.09* 0.97  CALCIUM 9.4 9.3 10.0 9.9  MG  --   --  1.7  --     GFR: Estimated Creatinine Clearance: 79.9 mL/min (by C-G formula based on SCr of 0.97 mg/dL). Liver Function Tests: No results for input(s): AST, ALT, ALKPHOS, BILITOT, PROT, ALBUMIN in the last 168 hours. No results for input(s): LIPASE, AMYLASE in the last 168 hours. No results for input(s): AMMONIA in the last 168 hours. Coagulation Profile: No results for input(s): INR, PROTIME in the last 168 hours. Cardiac Enzymes: No results for input(s): CKTOTAL, CKMB, CKMBINDEX, TROPONINI in the last 168 hours. BNP (last 3 results) No results for input(s): PROBNP in the last 8760 hours. HbA1C: Recent Labs    03/20/21 0215  HGBA1C 8.8*   CBG: Recent Labs  Lab 03/19/21 1603 03/19/21 2057 03/20/21 0736 03/20/21 1205 03/20/21 1553  GLUCAP 227* 301* 190* 318* 477*    Lipid Profile: No results for input(s): CHOL, HDL, LDLCALC, TRIG, CHOLHDL, LDLDIRECT in the last 72 hours. Thyroid Function Tests: No results for input(s): TSH, T4TOTAL, FREET4, T3FREE, THYROIDAB in the last 72 hours. Anemia Panel: No results for input(s): VITAMINB12, FOLATE, FERRITIN, TIBC, IRON, RETICCTPCT in the last 72 hours. Urine analysis:    Component Value Date/Time   COLORURINE YELLOW 03/03/2021 0810   APPEARANCEUR CLEAR 03/03/2021 0810   LABSPEC 1.022 03/03/2021 0810   PHURINE 5.0 03/03/2021 0810   GLUCOSEU >=500 (A) 03/03/2021 0810   HGBUR NEGATIVE 03/03/2021 0810   BILIRUBINUR NEGATIVE 03/03/2021 0810   KETONESUR 80 (A) 03/03/2021 0810   PROTEINUR NEGATIVE 03/03/2021 0810   UROBILINOGEN 0.2 07/20/2014 2022   NITRITE NEGATIVE 03/03/2021 0810   LEUKOCYTESUR SMALL (A) 03/03/2021 0810    Sepsis Labs: @LABRCNTIP (procalcitonin:4,lacticidven:4) ) No results found for this or any previous visit (from the past 240 hour(s)).  Radiology Studies: No results found.    Scheduled Meds:  enoxaparin (LOVENOX) injection  0.5 mg/kg Subcutaneous Q24H   feeding supplement  1 Container Oral TID WC   insulin aspart  0-5 Units Subcutaneous QHS   insulin aspart  0-9 Units Subcutaneous TID WC   insulin glargine  15 Units Subcutaneous QHS   multivitamin with minerals  1 tablet Oral Daily   Continuous Infusions:   LOS: 67 days      Hollice Espy, MD [facility] Pager: [email or web address] 03/20/2021, 4:27 PM

## 2021-03-21 ENCOUNTER — Inpatient Hospital Stay (HOSPITAL_COMMUNITY): Payer: Medicaid Other

## 2021-03-21 LAB — GLUCOSE, CAPILLARY
Glucose-Capillary: 284 mg/dL — ABNORMAL HIGH (ref 70–99)
Glucose-Capillary: 265 mg/dL — ABNORMAL HIGH (ref 70–99)
Glucose-Capillary: 87 mg/dL (ref 70–99)
Glucose-Capillary: 377 mg/dL — ABNORMAL HIGH (ref 70–99)

## 2021-03-21 LAB — PHOSPHORUS: Phosphorus: 2.8 mg/dL (ref 2.5–4.6)

## 2021-03-21 LAB — MAGNESIUM: Magnesium: 1.7 mg/dL (ref 1.7–2.4)

## 2021-03-21 LAB — POTASSIUM: Potassium: 3.6 mmol/L (ref 3.5–5.1)

## 2021-03-21 MED ORDER — OSMOLITE 1.5 CAL PO LIQD
1000.0000 mL | ORAL | Status: DC
  Administered 2021-03-21 – 2021-03-28 (×5): 1000 mL
  Filled 2021-03-21 (×11): qty 1000

## 2021-03-21 MED ORDER — PROSOURCE TF PO LIQD
45.0000 mL | Freq: Three times a day (TID) | ORAL | Status: DC
  Administered 2021-03-21 – 2021-03-29 (×24): 45 mL
  Filled 2021-03-21 (×24): qty 45

## 2021-03-21 NOTE — Progress Notes (Signed)
Nutrition Follow-up  DOCUMENTATION CODES:   Severe malnutrition in context of chronic illness  INTERVENTION:   Initiate tube feeds via Cortrak tube: - Start Osmolite 1.5 @ 20 ml/hr and advance by 10 ml q 8 hours to goal rate of 50 ml/hr (1200 ml/day) - ProSource TF 45 ml TID  Tube feeding regimen at goal provides 1920 kcal, 108 grams of protein, and 914 ml of H2O (meets 100% of kcal needs and 100% of protein needs).  Monitor magnesium, potassium, and phosphorus twice daily for at least 3 days, MD to replete as needed, as pt is at risk for refeeding syndrome given severe malnutrition.  - Continue Magic cup TID with meals, each supplement provides 290 kcal and 9 grams of protein  - Continue MVI with minerals daily  - d/c Boost Breeze  RD will monitor for ability to transition to nocturnal tube feeds: - Osmolite 1.5 @ 75 ml/hr x 10 hours from 1900 to 0500 - ProSource TF 45 ml TID  Nocturnal tube feeding regimen would provide 1245 kcal, 80 grams of protein, and 571 ml of H2O (would meet 67% of kcal needs and 89% of protein needs).  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  New diagnosis after completion of NFPE  GOAL:   Patient will meet greater than or equal to 90% of their needs  Progressing with initiation of TF  MONITOR:   PO intake, Supplement acceptance, Labs, Weight trends, TF tolerance  REASON FOR ASSESSMENT:   Consult Calorie Count, Poor PO  ASSESSMENT:   55 year old female past medical history of diabetes mellitus type 2, bipolar disorder type I, previous COVID infection, polysubstance abuse that includes cocaine.  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 Pt has been at [facility] for the past fifty-three days is not eating or drinking  sufficiently. Palliative care has been asked to get re-involved to further discuss goals of care in the setting of progressive failure to thrive and generalized weakness.  7/04 - megace initiated 7/09 - megace dose increased to BID 7/15 - Cortrak placed (tip gastric)  RD consult for tube feeding initiation and management. Per Palliative note today, plan is for short-term trial of NG tube feeds to assess for improvement. Cortrak placed today. Noted megace was d/c as it wasn't thought to be effective.  RD able to obtain updated weight today of 67.5 kg. If weight on 01/11/21 is accurate, pt has experienced a 32.3 kg weight loss since that date. This is a 32% weight loss in just over 2 months which is severe and significant for timeframe. Pt meets criteria for severe malnutrition.  Of note, pt with non-pitting edema to BUE and BLE which may be masking additional weight loss.  Spoke with pt at bedside who is unable to offer much but reports that she has not eaten anything today. Noted calorie count envelope left on door from when calorie count was completed earlier this month. Meal slips available for 03/19/21. On this date, pt consumed a total of 1454 kcal and 52 grams of protein, meeting 58% of her minimum estimated kcal needs and 79% of her minimum estimated protein needs.  RD will discontinue Boost Breeze at this time given elevated CBG's. Will monitor for ability to resume oral nutrition supplement. Will continue with Magic Cups with meals.  Meal Completion: 10-50% x last  8 documented meals  Medications reviewed and include: Boost Breeze TID, SSI, lantus 25 units daily, MVI with minerals,   Labs reviewed. CBG's: 230-477 x 24 hours  NUTRITION - FOCUSED PHYSICAL EXAM:  Flowsheet Row Most Recent Value  Orbital Region Moderate depletion  Upper Arm Region Moderate depletion  Thoracic and Lumbar Region Moderate depletion  Buccal Region Mild depletion  Temple Region Mild depletion  Clavicle Bone  Region Severe depletion  Clavicle and Acromion Bone Region Severe depletion  Scapular Bone Region Unable to assess  Dorsal Hand Unable to assess  Patellar Region Moderate depletion  Anterior Thigh Region Severe depletion  Posterior Calf Region Severe depletion  Edema (RD Assessment) Mild  Hair Reviewed  Eyes Reviewed  Mouth Reviewed  Skin Reviewed  Nails Reviewed       Diet Order:   Diet Order             DIET DYS 2 Room service appropriate? No; Fluid consistency: Thin  Diet effective now                   EDUCATION NEEDS:   No education needs have been identified at this time  Skin:  Skin Assessment: Reviewed RN Assessment  Last BM:  03/20/21  Height:   Ht Readings from Last 1 Encounters:  03/15/21 5\' 6"  (1.676 m)    Weight:   Wt Readings from Last 1 Encounters:  03/21/21 67.5 kg    Ideal Body Weight:  59.1 kg  BMI:  Body mass index is 24.02 kg/m.  Estimated Nutritional Needs:   Kcal:  1850-2050  Protein:  90-110 grams  Fluid:  >/= 1.8 L    03/23/21, MS, RD, LDN Inpatient Clinical Dietitian Please see AMiON for contact information.

## 2021-03-21 NOTE — Progress Notes (Signed)
NGT still not in place per CXR. 4th attempt at this time to reposition however pt unable to follow directions and comply. Dr. Rito Ehrlich notified and stated he will place order for cortrak team.

## 2021-03-21 NOTE — Progress Notes (Signed)
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 Still unsure if NPH primary issues or other underlying problem due to h/o drug abuse/bipolar. I plan to update her family later today. RN said they were here yesterday- if this is irreversible she shouldn't get a PEG and should be comfort in my opinion"   HPI:  Per intake H&P --> 54 year old female past medical history of diabetes mellitus type 2, bipolar disorder type I, previous COVID infection, polysubstance abuse that includes cocaine.  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   Carolyn Norton has been at [facility] for the past fifty-three days is not eating or drinking sufficiently.  Palliative care has been asked to get re-involved to further discuss goals of care in the setting of progressive failure to thrive and generalized weakness.  Today's Discussion (03/21/2021): Chart reviewed.  I met with Carolyn Norton at bedside this afternoon. She was alert to self only. We reviewed the plan to place a cortrack feeding tube this afternoon. She did share that it was "painful" when they tried to place the other feeding tubes.  I talked to patients RN, Danton Sewer. She reviewed that a cortrack order has now been placed. This will more than likely occur today.   I spoke to patients father, Carolyn Norton over the telephone. Reviewed the plan to see if the "trial period" of NGT feedings is of benefit to Carolyn Norton. Carolyn Norton states that this will help guide future decisions in regard to if he and his family would want long term tube feeding for Carolyn Norton or not.   A copy of patients advance directives has been obtained.   Questions and  concerns addressed   Objective Assessment: Vital Signs Vitals:   03/20/21 2029 03/21/21 0441  BP: (!) 154/77 (!) 159/93  Pulse: 99 96  Resp: 20 20  Temp: 98 F (36.7 C) 97.9 F (36.6 C)  SpO2: 96%     Intake/Output Summary (Last 24 hours) at 03/21/2021 1251 Last data filed at 03/21/2021 0448 Gross per 24 hour  Intake --  Output 500 ml  Net -500 ml    Last Weight  Most recent update: 01/11/2021 11:52 AM    Weight  99.8 kg (220 lb)            Gen:  Caucasian F in NAD HEENT: Dry  mucous membranes CV: Regular rate and rhythm PULM: On RA ABD: soft/nontender EXT: No edema Neuro: Responds to name  SUMMARY OF RECOMMENDATIONS   DNAR/DNI  Cortrack to be placed this afternoon. Based upon tolerance additional conversations with family will ensue in the oncoming days  Advance Directives have been obtained and will be scanned into V[employer]  Appreciate TOC - MSW helping patients father navigate Disability & Medicaid application   Plan for long term - custodial care placement patient no longer advancing in therapy   Ongoing incremental palliative care support  Time Spent : 25 Greater than 50% of the time was spent in counseling and coordination of care ______________________________________________________________________________________ [place] Team Team Cell Phone: [phone] Please utilize secure chat with additional questions, if there is no response within 30 minutes please call the above phone number  Palliative Medicine  Team providers are available by phone from 7am to 7pm daily and can be reached through the team cell phone.  Should this patient require assistance outside of these hours, please call the patient's attending physician.

## 2021-03-21 NOTE — Progress Notes (Signed)
NG tube pulled back and repositioned. Chest xray ordered to confirm placement.

## 2021-03-21 NOTE — TOC Progression Note (Signed)
Transition of Care [facility]) - Progression Note    Patient Details  Name: Carolyn Norton MRN: [id number] Date of Birth: 1966/04/25  [facility] CM/SW Contact  Janae Bridgeman, RN Phone Number: 03/21/2021, 8:13 AM  Clinical Narrative:    Case management and MSW are currently following the patient for transitions of care needs.  The patient continues to have poor po intake and is being followed by Palliative Care.  The patient's family requested NG tube placement for nutrition - placed on 03/20/2021.  The patient needs LTC placement and has no current bed offers at this time.  CM and MSW will continue to follow the patient for transition of care needs for LTC placement.   Expected Discharge Plan: Long Term Acute Care (LTAC) Barriers to Discharge: SNF Pending payor source - LOG  Expected Discharge Plan and Services Expected Discharge Plan: Long Term Acute Care (LTAC) In-house Referral: Clinical Social Work Discharge Planning Services: CM Consult Post Acute Care Choice: Skilled Nursing Facility Living arrangements for the past 2 months: Single Family Home Expected Discharge Date: 01/16/21               DME Arranged: [name] rolling DME Agency: [employer] Date DME Agency Contacted: 01/20/21 Time DME Agency Contacted: 1435 Representative spoke with at DME Agency: [name]             Social Determinants of Health (SDOH) Interventions    Readmission Risk Interventions Readmission Risk Prevention Plan 01/14/2021  Transportation Screening Complete  PCP or Specialist Appt within 3-5 Days (No Data)  Social Work Consult for Recovery Care Planning/Counseling Complete  Palliative Care Screening Not Applicable  Medication Review Oceanographer) Referral to Pharmacy  Some recent data might be hidden

## 2021-03-21 NOTE — Procedures (Signed)
Cortrak  Person Inserting Tube:  Carolyn Norton, RD Tube Type:  Cortrak - 43 inches Tube Size:  10 Tube Location:  Left nare Initial Placement:  Stomach Secured by: Bridle Technique Used to Measure Tube Placement:  Marking at nare/corner of mouth Cortrak Secured At:  60 cm Cortrak Tube Team Note:  Consult received to place a Cortrak feeding tube.   X-ray is required, abdominal x-ray has been ordered by the Cortrak team. Please confirm tube placement before using the Cortrak tube.   If the tube becomes dislodged please keep the tube and contact the Cortrak team at [email or web address] (password [email or web address]) for replacement.  If after hours and replacement cannot be delayed, place a NG tube and confirm placement with an abdominal x-ray.    Carolyn Berland, MS, RD, LDN (she/her/hers) RD pager number and weekend/on-call pager number located in Amion.    

## 2021-03-21 NOTE — Progress Notes (Signed)
OK to use tube.

## 2021-03-21 NOTE — Progress Notes (Signed)
Inpatient Diabetes Program Recommendations  AACE/ADA: New Consensus Statement on Inpatient Glycemic Control (2015)  Target Ranges:  Prepandial:   less than 140 mg/dL      Peak postprandial:   less than 180 mg/dL (1-2 hours)      Critically ill patients:  140 - 180 mg/dL   Lab Results  Component Value Date   GLUCAP 284 (H) 03/21/2021   HGBA1C 8.8 (H) 03/20/2021    Review of Glycemic Control Results for Carolyn Norton, Carolyn Norton (MRN [id number]) as of 03/21/2021 09:15  Ref. Range 03/20/2021 07:36 03/20/2021 12:05 03/20/2021 15:53 03/20/2021 20:28 03/21/2021 07:32  Glucose-Capillary Latest Ref Range: 70 - 99 mg/dL 409 (H) 811 (H) 914 (H) 230 (H) 284 (H)    Current orders for Inpatient glycemic control: Novolog 0-20 units TID & HS, Lantus 25 units QHS  Inpatient Diabetes Program Recommendations:    Noted poor oral intake and placement of NG tube.   Pending plan for tube feeds, consider adding Novolog 3 units Q4H (for tube feed coverage, to be stopped or held in the event tube feeds are stopped) and changing correction to Q4H once tube feeds are initiated.  Of note Boost Breeze containing 54 g of CHO.   Correction held this AM due to NPO status. Secure chat sent to RN to note orders and encouraged to given per MD order.   Thanks, Lujean Rave, MSN, RNC-OB Diabetes Coordinator [phone] (8a-5p)

## 2021-03-21 NOTE — Progress Notes (Signed)
PROGRESS NOTE    Carolyn Norton   WIO:[id number]  DOB: 09/13/1965  DOA: 01/11/2021 PCP: [name], FNP   Brief Narrative:  Carolyn Norton a 55 year old female with past medical history of diabetes mellitus type 2, bipolar disorder, cocaine abuse, cognitive impairment who lives with her parents and is taken care of by them. She was unable to give a history in the ED.  She presented from home with hyperglycemia (CBG 700s), steady cognitive and function decline at home  with inability to ambulate with multiple falls over the past 3 weeks. She had been confused for 4 months (since she had COVID). She had stopped taking her meds 3 days prior to coming in which appeared to be the reason her parents brought her. She was unable to ambulate in the ED and had hyperglycemia (CBG 729).  She was admitted to [facility].  Her hospital stay has been complicated by progressive cognitive and functional decline.  She has had psychiatry, palliative care, neurology and neurosurgery consults but no cause for her rapid cognitive decline has been found.  At this point, PT has signed off not recommending her for SNF, but for some kind if long term care.  In discussion with patient's father, no plans for long-term feeding tube, but requested to try NG tube.  Nursing team unable to place NG tube due to patient compliance.  [facility] team consulted.  Patient about the same.  No complaints, but then does not respond to further questions   Assessment & Plan:   Altered mental status, acute encephalopathy - On psychiatric eval on 5/14, she scored a 29 out of 30 on a Mini-Mental exam.  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- multiple LPs done -Neurosurgery was consulted on 6/5 and on 6/10 there was a plan to proceed with a lumbar drain trial however it  was canceled as she was suspected to have a UTI on 6/9 and given Ceftriaxone- no culture - around 6/27 suspected again have a UTI contributing to another change in mental status- urine culture showed 60, 000 K coli and 10,000 K pneumnoiae- Given Cefazolin from 6/30-7/3 - Large-volume LP performed on 6/30-diagnosis of NPH was ruled out as she did not have significant improvement-neurology noted that 'she needs outpatient neurosurgical eval for consideration of a VP shunt but it will not be meaningful until she has improvement in mentation and ambulation.' - neurology ultimately felt she had neurological issues are due to underlying dementia -Last neurology note from 7/2 recommends outpatient follow-up by neurology and neurosurgery - her exam remains unchanged day after day and her mother confirms this-did not respond much today.   Other active problems: Poor oral intake - weight on 5/7- 99.8 kg- weight today 67.7 kg - Calorie count started on 7/6 -  -Megace started on 03/11/2019 -IV fluids have been on hold and we are following her oral intake - 7/9- megace increased to BID, but discontinued by 5/12 as it does not seem to help and is causing hyperglycemia. -- 7/12> calorie count discontinued  the patient continues to eat poorly and is taking in < 50% of meals  - I have touched base with palliative care to ask parents if they would like a feeding tube- appreciate assistance As per family wishes, NG tube attempted on 7/14 with no success.  Waiting for  [facility] team.  Acute respiratory distress > unilateral wheezing, increased respiratory rate and use of accessory muscles - 7/9 -Was being fed by her elderly mother on 7/8 while being somnolent-the nurse educated her not to do this-suspicion that she may have aspirated-chest x-ray clear- no fevers noted- -Assessed by SLP and recommended D2 diet with thin liquids which is what she was previously on   Hypokalemia - likely related to poor oral intake -  replacing as needed   Diabetes mellitus type 2 uncontrolled - Hemoglobin A1c is 11 on 5/8 - sugars quite uncontrolled since starting Megace - Lantus increased to 35 U for tonight (home dose was 45U) - Cont AC and HS SSI and mealtime insulin - 7/11: Hypoglycemia> she did not > 50% of meals and was given 6 U of the insulin that was ordered to be given only if she ate > 50% - I have discontinued it.  - 7/12 stop Megace - decrease lantus to 15 U  COPD - no acute issues with this   E. coli and Klebsiella UTI- ~ 6/27 - Has been treated   Bipolar disorder - Lexapro, Depakote and gabapentin are home meds - psych eval 5/14- meds recommended by psych to be continued  - Depakote, Lexapro and Gabapentin were stopped on 6/29 due to lethargy   Severe lower extremity weakness (starting at least 1 month prior to admission) - I have reviewed the notes- per history obtained from her mother and father (by multiple sources), the patient had not been ambulating for weeks prior to presenting to the ED- Parents assisted her with ADLs at home.  -   first hospital PT eval on 5/8- she was not able to ambulate. - Now needs heavy 2+ assist to sit - cannot stand - I suspect severe deconditioning - MRI's cervical thoracic lumbar spine nonrevealing   - Has no been participating with PT this past week, and they have since signed off on her.   History of polysubstance abuse Including cocaine and alcohol -On 2/15 she was cocaine and benzodiazepine positive    DVT prophylaxis: SCD's Start: 02/14/21 6767   Code Status: DNR Family Communication: Updated father by phone Level of Care: Level of care: Med-Surg Disposition Plan:  Status is: Inpatient  Remains inpatient appropriate because:Unsafe d/c plan  Dispo:  Patient From:  home with parents  Planned Disposition: Long-term care facility.  Medically stable for discharge: No       Antimicrobials:  Anti-infectives (From admission, onward)    Start      Dose/Rate Route Frequency Ordered Stop   03/06/21 0900  ceFAZolin (ANCEF) IVPB 1 g/50 mL premix        1 g 100 mL/hr over 30 Minutes Intravenous Every 8 hours 03/06/21 0801 03/10/21 0559   03/04/21 1430  cefTRIAXone (ROCEPHIN) 1 g in sodium chloride 0.9 % 100 mL IVPB  Status:  Discontinued        1 g 200 mL/hr over 30 Minutes Intravenous Every 24 hours 03/04/21 1335 03/06/21 0800   02/14/21 0828  vancomycin (VANCOREADY) IVPB 1000 mg/200 mL  Status:  Discontinued        1,000 mg 200 mL/hr over 60 Minutes Intravenous 60 min pre-op 02/14/21 0828 03/02/21 1139   02/13/21 2115  cefTRIAXone (ROCEPHIN) 1 g in sodium chloride 0.9 % 100 mL IVPB  Status:  Discontinued        1 g 200 mL/hr over 30 Minutes Intravenous Every 24 hours 02/13/21 2016 02/16/21 0844  Objective: Vitals:   03/20/21 0604 03/20/21 1325 03/20/21 2029 03/21/21 0441  BP: (!) 160/97 139/79 (!) 154/77 (!) 159/93  Pulse: 96 91 99 96  Resp: 14 18 20 20   Temp: 99 F (37.2 C) 98.6 F (37 C) 98 F (36.7 C) 97.9 F (36.6 C)  TempSrc:      SpO2:  97% 96%   Weight:      Height:        Intake/Output Summary (Last 24 hours) at 03/21/2021 0910 Last data filed at 03/21/2021 0448 Gross per 24 hour  Intake --  Output 500 ml  Net -500 ml    Filed Weights   01/11/21 1152  Weight: 99.8 kg    Examination: No change from previous day General exam: Alert and oriented x1, does not follow commands HEENT: Normocephalic, atraumatic, mucous membranes slightly dry Respiratory system: Clear to auscultation.  No labored breathing Cardiovascular system: Regular rate and rhythm, S1-S2 Gastrointestinal system: Soft, nontender, nondistended, positive bowel sounds Central nervous system: No focal deficits Extremities: No cyanosis, clubbing or edema Skin: No skin breaks, tears or lesions Psychiatry: Now more of a chronic cognitive decline      Data Reviewed: I have personally reviewed following labs and imaging  studies  CBC: Recent Labs  Lab 03/17/21 0218 03/20/21 0215  WBC 11.3* 9.1  HGB 12.8 13.6  HCT 38.6 40.3  MCV 87.5 87.8  PLT 417* 377    Basic Metabolic Panel: Recent Labs  Lab 03/16/21 2207 03/17/21 0218 03/18/21 0258  NA 130* 138 137  K 4.8 3.3* 3.5  CL 97* 102 101  CO2 25 25 25   GLUCOSE 680* 292* 121*  BUN 24* 21* 20  CREATININE 1.27* 1.09* 0.97  CALCIUM 9.3 10.0 9.9  MG  --  1.7  --     GFR: Estimated Creatinine Clearance: 79.9 mL/min (by C-G formula based on SCr of 0.97 mg/dL). Liver Function Tests: No results for input(s): AST, ALT, ALKPHOS, BILITOT, PROT, ALBUMIN in the last 168 hours. No results for input(s): LIPASE, AMYLASE in the last 168 hours. No results for input(s): AMMONIA in the last 168 hours. Coagulation Profile: No results for input(s): INR, PROTIME in the last 168 hours. Cardiac Enzymes: No results for input(s): CKTOTAL, CKMB, CKMBINDEX, TROPONINI in the last 168 hours. BNP (last 3 results) No results for input(s): PROBNP in the last 8760 hours. HbA1C: Recent Labs    03/20/21 0215  HGBA1C 8.8*    CBG: Recent Labs  Lab 03/20/21 0736 03/20/21 1205 03/20/21 1553 03/20/21 2028 03/21/21 0732  GLUCAP 190* 318* 477* 230* 284*    Lipid Profile: No results for input(s): CHOL, HDL, LDLCALC, TRIG, CHOLHDL, LDLDIRECT in the last 72 hours. Thyroid Function Tests: No results for input(s): TSH, T4TOTAL, FREET4, T3FREE, THYROIDAB in the last 72 hours. Anemia Panel: No results for input(s): VITAMINB12, FOLATE, FERRITIN, TIBC, IRON, RETICCTPCT in the last 72 hours. Urine analysis:    Component Value Date/Time   COLORURINE YELLOW 03/03/2021 0810   APPEARANCEUR CLEAR 03/03/2021 0810   LABSPEC 1.022 03/03/2021 0810   PHURINE 5.0 03/03/2021 0810   GLUCOSEU >=500 (A) 03/03/2021 0810   HGBUR NEGATIVE 03/03/2021 0810   BILIRUBINUR NEGATIVE 03/03/2021 0810   KETONESUR 80 (A) 03/03/2021 0810   PROTEINUR NEGATIVE 03/03/2021 0810   UROBILINOGEN 0.2  07/20/2014 2022   NITRITE NEGATIVE 03/03/2021 0810   LEUKOCYTESUR SMALL (A) 03/03/2021 0810   Sepsis Labs: @LABRCNTIP (procalcitonin:4,lacticidven:4) ) No results found for this or any previous visit (from the past  240 hour(s)).        Radiology Studies: DG CHEST PORT 1 VIEW  Result Date: 03/20/2021 CLINICAL DATA:  55 year old female status post NG placement EXAM: PORTABLE CHEST 1 VIEW COMPARISON:  Chest radiograph dated 03/13/2021. FINDINGS: Six partially visualized tube over the upper neck may represent the NG tube. The tube is bent and extends superiorly with tip beyond the superior margin of the image. Recommend retraction and repositioning. No focal consolidation, pleural effusion or pneumothorax. The cardiac silhouette is within limits. Atherosclerotic calcification of aortic arch. Old healed right rib fractures. No acute osseous pathology. IMPRESSION: Partially visualized tube over the lower neck and thoracic inlet, indeterminate. Recommend re-evaluation and repositioning. Electronically Signed   By: Elgie Collard M.D.   On: 03/20/2021 19:46      Scheduled Meds:  enoxaparin (LOVENOX) injection  0.5 mg/kg Subcutaneous Q24H   feeding supplement  1 Container Oral TID WC   insulin aspart  0-20 Units Subcutaneous TID WC   insulin aspart  0-5 Units Subcutaneous QHS   insulin glargine  25 Units Subcutaneous QHS   multivitamin with minerals  1 tablet Oral Daily   Continuous Infusions:   LOS: 68 days      Hollice Espy, MD [facility] Pager: [email or web address] 03/21/2021, 9:10 AM

## 2021-03-22 LAB — GLUCOSE, CAPILLARY
Glucose-Capillary: 182 mg/dL — ABNORMAL HIGH (ref 70–99)
Glucose-Capillary: 225 mg/dL — ABNORMAL HIGH (ref 70–99)
Glucose-Capillary: 233 mg/dL — ABNORMAL HIGH (ref 70–99)
Glucose-Capillary: 284 mg/dL — ABNORMAL HIGH (ref 70–99)
Glucose-Capillary: 302 mg/dL — ABNORMAL HIGH (ref 70–99)
Glucose-Capillary: 320 mg/dL — ABNORMAL HIGH (ref 70–99)
Glucose-Capillary: 354 mg/dL — ABNORMAL HIGH (ref 70–99)
Glucose-Capillary: 375 mg/dL — ABNORMAL HIGH (ref 70–99)

## 2021-03-22 LAB — PHOSPHORUS
Phosphorus: 4.1 mg/dL (ref 2.5–4.6)
Phosphorus: 3.5 mg/dL (ref 2.5–4.6)

## 2021-03-22 LAB — MAGNESIUM
Magnesium: 1.7 mg/dL (ref 1.7–2.4)
Magnesium: 1.8 mg/dL (ref 1.7–2.4)

## 2021-03-22 LAB — POTASSIUM
Potassium: 4 mmol/L (ref 3.5–5.1)
Potassium: 4.3 mmol/L (ref 3.5–5.1)

## 2021-03-22 MED ORDER — INSULIN GLARGINE 100 UNIT/ML ~~LOC~~ SOLN
40.0000 [IU] | Freq: Every day | SUBCUTANEOUS | Status: DC
  Administered 2021-03-22: 40 [IU] via SUBCUTANEOUS
  Filled 2021-03-22 (×2): qty 0.4

## 2021-03-22 MED ORDER — INSULIN ASPART 100 UNIT/ML IJ SOLN
0.0000 [IU] | INTRAMUSCULAR | Status: DC
  Administered 2021-03-22: 4 [IU] via SUBCUTANEOUS
  Administered 2021-03-22: 20 [IU] via SUBCUTANEOUS
  Administered 2021-03-22: 7 [IU] via SUBCUTANEOUS
  Administered 2021-03-23 (×3): 11 [IU] via SUBCUTANEOUS
  Administered 2021-03-23 (×3): 4 [IU] via SUBCUTANEOUS
  Administered 2021-03-24: 7 [IU] via SUBCUTANEOUS
  Administered 2021-03-24: 3 [IU] via SUBCUTANEOUS
  Administered 2021-03-24: 7 [IU] via SUBCUTANEOUS
  Administered 2021-03-24: 11 [IU] via SUBCUTANEOUS
  Administered 2021-03-24: 4 [IU] via SUBCUTANEOUS
  Administered 2021-03-24: 7 [IU] via SUBCUTANEOUS
  Administered 2021-03-25: 4 [IU] via SUBCUTANEOUS
  Administered 2021-03-25: 3 [IU] via SUBCUTANEOUS
  Administered 2021-03-25: 4 [IU] via SUBCUTANEOUS
  Administered 2021-03-25: 15 [IU] via SUBCUTANEOUS
  Administered 2021-03-26: 11 [IU] via SUBCUTANEOUS
  Administered 2021-03-26: 3 [IU] via SUBCUTANEOUS
  Administered 2021-03-26 (×2): 7 [IU] via SUBCUTANEOUS
  Administered 2021-03-26: 11 [IU] via SUBCUTANEOUS
  Administered 2021-03-26: 7 [IU] via SUBCUTANEOUS
  Administered 2021-03-26: 4 [IU] via SUBCUTANEOUS
  Administered 2021-03-27: 11 [IU] via SUBCUTANEOUS
  Administered 2021-03-27: 7 [IU] via SUBCUTANEOUS
  Administered 2021-03-27: 4 [IU] via SUBCUTANEOUS
  Administered 2021-03-27: 11 [IU] via SUBCUTANEOUS
  Administered 2021-03-27 – 2021-03-28 (×2): 4 [IU] via SUBCUTANEOUS
  Administered 2021-03-28: 15 [IU] via SUBCUTANEOUS
  Administered 2021-03-28: 4 [IU] via SUBCUTANEOUS
  Administered 2021-03-28: 7 [IU] via SUBCUTANEOUS
  Administered 2021-03-28: 3 [IU] via SUBCUTANEOUS
  Administered 2021-03-28: 11 [IU] via SUBCUTANEOUS
  Administered 2021-03-29 (×3): 4 [IU] via SUBCUTANEOUS
  Administered 2021-03-29: 7 [IU] via SUBCUTANEOUS
  Administered 2021-03-29 – 2021-03-30 (×3): 20 [IU] via SUBCUTANEOUS
  Administered 2021-03-30: 4 [IU] via SUBCUTANEOUS
  Administered 2021-03-30: 15 [IU] via SUBCUTANEOUS

## 2021-03-22 NOTE — Progress Notes (Signed)
PROGRESS NOTE    Carolyn Norton   TLX:[id number]  DOB: [birth year]  DOA: 01/11/2021 PCP: [name], FNP   Brief Narrative:  Carolyn Norton a 55 year old female with past medical history of diabetes mellitus type 2, bipolar disorder, cocaine abuse, cognitive impairment who lives with her parents and is taken care of by them. She was unable to give a history in the ED.  She presented from home with hyperglycemia (CBG 700s), steady cognitive and function decline at home  with inability to ambulate with multiple falls over the past 3 weeks. She had been confused for 4 months (since she had COVID). She had stopped taking her meds 3 days prior to coming in which appeared to be the reason her parents brought her. She was unable to ambulate in the ED and had hyperglycemia (CBG 729).  She was admitted to [facility].  Her hospital stay has been complicated by progressive cognitive and functional decline.  She has had psychiatry, palliative care, neurology and neurosurgery consults but no cause for her rapid cognitive decline has been found.  At this point, PT has signed off not recommending her for SNF, but for some kind if long term care.  In discussion with patient's father, no plans for long-term feeding tube, but requested to try NG tube.  Nursing team unable to place NG tube due to patient compliance.  [facility] team consulted.  Patient about the same.  No complaints, but then does not respond to further questions   Assessment & Plan:   Altered mental status, acute encephalopathy - On psychiatric eval on 5/14, she scored a 29 out of 30 on a Mini-Mental exam.  She was recommended to take Lexapro 20 mg daily Depakote 500 mg daily gabapentin 300 mg.  She was subsequently seen by neurology along with neurosurgery for suspicion of NPH, multiple large-volume LPs were done and eventually on 03/06/2021 clinically and after an LP neurology finally ruled out NPH.  They recommend outpatient neurology  follow-up for further work-up if needed.  Currently mental status has not much changed, neurology now thinks that she has underlying dementia and she will benefit from supportive care and outpatient neurology follow-up.  Unfortunately her cognitive skills are gradually declining.      Poor oral intake - weight on 5/7- 99.8 kg- weight today 67.7 kg, moderate to severe protein calorie malnutrition.  Initially placed on Megace but this was discontinued after hyperglycemia, father who is the chief decision-maker was consulted and he requested 7-day trial of NG tube with tube feeds which will be done.  However I do not think this is a long-term strategy as after 7 days of nutrition she will most likely be back to the present baseline.  Father is open for comfort measures if she declines in the future.    Acute respiratory distress > unilateral wheezing, increased respiratory rate and use of accessory muscles - 7/9 -Was being fed by her elderly mother on 7/8 while being somnolent-the nurse educated her not to do this-suspicion that she may have aspirated-chest x-ray clear- no fevers noted- -Assessed by SLP and recommended D2 diet with thin liquids which is what she was previously on    COPD - no acute issues with this   E. coli and Klebsiella UTI- ~ 6/27  - Has been treated   Bipolar disorder - Lexapro, Depakote and gabapentin are home meds - Depakote, Lexapro and Gabapentin were stopped on 6/29 due to lethargy   Severe lower extremity weakness (starting  at least 1 month prior to admission) - I have reviewed the notes- per history obtained from her mother and father (by multiple sources), the patient had not been ambulating for weeks prior to presenting to the ED- Parents assisted her with ADLs at home.  -   first hospital PT eval on 5/8- she was not able to ambulate. - Now needs heavy 2+ assist to sit - cannot stand - I suspect severe deconditioning - MRI's cervical thoracic lumbar spine  nonrevealing, had extensive work-up by neurology - Has no been participating with PT this past week, and they have since signed off on her.   History of polysubstance abuse  - Including cocaine and alcohol, On 2/15 she was cocaine and benzodiazepine positive.  She has been extensively counseled to quit all.   Diabetes mellitus type 2 uncontrolled - Hemoglobin A1c is 11 on 01/12/21 suggesting poor outpatient control due to hyperglycemia. On Lantus and sliding scale, adjusted on 03/22/2021  CBG (last 3)  Recent Labs    03/22/21 0559 03/22/21 0756 03/22/21 1146  GLUCAP 375* 284* 225*        DVT prophylaxis: SCD's Start: 02/14/21 0829, Lovenox Code Status: DNR Family Communication: Updated father Ephriam KnucklesClifton [phone] - by phone on 03/22/21 Level of Care: Level of care: Med-Surg Disposition Plan:  Status is: Inpatient  Remains inpatient appropriate because:Unsafe d/c plan  Dispo:  Patient From:  home with parents  Planned Disposition: Long-term care facility.  Medically stable for discharge: No       Antimicrobials:  Anti-infectives (From admission, onward)    Start     Dose/Rate Route Frequency Ordered Stop   03/06/21 0900  ceFAZolin (ANCEF) IVPB 1 g/50 mL premix        1 g 100 mL/hr over 30 Minutes Intravenous Every 8 hours 03/06/21 0801 03/10/21 0559   03/04/21 1430  cefTRIAXone (ROCEPHIN) 1 g in sodium chloride 0.9 % 100 mL IVPB  Status:  Discontinued        1 g 200 mL/hr over 30 Minutes Intravenous Every 24 hours 03/04/21 1335 03/06/21 0800   02/14/21 0828  vancomycin (VANCOREADY) IVPB 1000 mg/200 mL  Status:  Discontinued        1,000 mg 200 mL/hr over 60 Minutes Intravenous 60 min pre-op 02/14/21 0828 03/02/21 1139   02/13/21 2115  cefTRIAXone (ROCEPHIN) 1 g in sodium chloride 0.9 % 100 mL IVPB  Status:  Discontinued        1 g 200 mL/hr over 30 Minutes Intravenous Every 24 hours 02/13/21 2016 02/16/21 0844        Objective: Vitals:   03/21/21 0441 03/21/21  1456 03/21/21 1954 03/22/21 0410  BP: (!) 159/93  (!) 157/75 140/80  Pulse: 96  (!) 103 94  Resp: 20  16 16   Temp: 97.9 F (36.6 C)  97.8 F (36.6 C) 98 F (36.7 C)  TempSrc:    Oral  SpO2:   97% 97%  Weight:  67.5 kg    Height:        Intake/Output Summary (Last 24 hours) at 03/22/2021 1223 Last data filed at 03/22/2021 0600 Gross per 24 hour  Intake 484 ml  Output --  Net 484 ml   Filed Weights   01/11/21 1152 03/21/21 1456  Weight: 99.8 kg 67.5 kg    Examination:  Awake but not completely alert, follows commands inconsistently, NG in place [place].AT,[name] Supple Neck,No JVD, No cervical lymphadenopathy appriciated.  Symmetrical Chest wall movement, Good air movement bilaterally,  CTAB RRR,No Gallops, Rubs or new Murmurs, No Parasternal Heave +ve B.Sounds, Abd Soft, No tenderness, No organomegaly appriciated, No rebound - guarding or rigidity. No Cyanosis, Clubbing or edema,      Data Reviewed: I have personally reviewed following labs and imaging studies  CBC: Recent Labs  Lab 03/17/21 0218 03/20/21 0215  WBC 11.3* 9.1  HGB 12.8 13.6  HCT 38.6 40.3  MCV 87.5 87.8  PLT 417* 377   Basic Metabolic Panel: Recent Labs  Lab 03/16/21 2207 03/17/21 0218 03/18/21 0258 03/21/21 1647 03/22/21 0510  NA 130* 138 137  --   --   K 4.8 3.3* 3.5 3.6 4.0  CL 97* 102 101  --   --   CO2 25 25 25   --   --   GLUCOSE 680* 292* 121*  --   --   BUN 24* 21* 20  --   --   CREATININE 1.27* 1.09* 0.97  --   --   CALCIUM 9.3 10.0 9.9  --   --   MG  --  1.7  --  1.7 1.7  PHOS  --   --   --  2.8 4.1   GFR: Estimated Creatinine Clearance: 62.8 mL/min (by C-G formula based on SCr of 0.97 mg/dL). Liver Function Tests: No results for input(s): AST, ALT, ALKPHOS, BILITOT, PROT, ALBUMIN in the last 168 hours. No results for input(s): LIPASE, AMYLASE in the last 168 hours. No results for input(s): AMMONIA in the last 168 hours. Coagulation Profile: No results for input(s): INR,  PROTIME in the last 168 hours. Cardiac Enzymes: No results for input(s): CKTOTAL, CKMB, CKMBINDEX, TROPONINI in the last 168 hours. BNP (last 3 results) No results for input(s): PROBNP in the last 8760 hours. HbA1C: Recent Labs    03/20/21 0215  HGBA1C 8.8*   CBG: Recent Labs  Lab 03/22/21 0031 03/22/21 0402 03/22/21 0559 03/22/21 0756 03/22/21 1146  GLUCAP 302* 320* 375* 284* 225*   Lipid Profile: No results for input(s): CHOL, HDL, LDLCALC, TRIG, CHOLHDL, LDLDIRECT in the last 72 hours. Thyroid Function Tests: No results for input(s): TSH, T4TOTAL, FREET4, T3FREE, THYROIDAB in the last 72 hours. Anemia Panel: No results for input(s): VITAMINB12, FOLATE, FERRITIN, TIBC, IRON, RETICCTPCT in the last 72 hours. Urine analysis:    Component Value Date/Time   COLORURINE YELLOW 03/03/2021 0810   APPEARANCEUR CLEAR 03/03/2021 0810   LABSPEC 1.022 03/03/2021 0810   PHURINE 5.0 03/03/2021 0810   GLUCOSEU >=500 (A) 03/03/2021 0810   HGBUR NEGATIVE 03/03/2021 0810   BILIRUBINUR NEGATIVE 03/03/2021 0810   KETONESUR 80 (A) 03/03/2021 0810   PROTEINUR NEGATIVE 03/03/2021 0810   UROBILINOGEN 0.2 07/20/2014 2022   NITRITE NEGATIVE 03/03/2021 0810   LEUKOCYTESUR SMALL (A) 03/03/2021 0810   Sepsis Labs: @LABRCNTIP (procalcitonin:4,lacticidven:4) ) No results found for this or any previous visit (from the past 240 hour(s)).        Radiology Studies: DG CHEST PORT 1 VIEW  Result Date: 03/20/2021 CLINICAL DATA:  55 year old female status post NG placement EXAM: PORTABLE CHEST 1 VIEW COMPARISON:  Chest radiograph dated 03/13/2021. FINDINGS: Six partially visualized tube over the upper neck may represent the NG tube. The tube is bent and extends superiorly with tip beyond the superior margin of the image. Recommend retraction and repositioning. No focal consolidation, pleural effusion or pneumothorax. The cardiac silhouette is within limits. Atherosclerotic calcification of aortic  arch. Old healed right rib fractures. No acute osseous pathology. IMPRESSION: Partially visualized tube over the lower  neck and thoracic inlet, indeterminate. Recommend re-evaluation and repositioning. Electronically Signed   By: Elgie Collard M.D.   On: 03/20/2021 19:46   DG Abd Portable 1V  Result Date: 03/21/2021 CLINICAL DATA:  Feeding tube placement EXAM: PORTABLE ABDOMEN - 1 VIEW COMPARISON:  None. FINDINGS: Nonobstructive pattern of bowel gas. Enteric contrast in the colon. Enteric feeding tube is positioned with tip and side port below the diaphragm, tip in the vicinity of the duodenal bulb on this rotated examination. IMPRESSION: Enteric feeding tube is positioned with tip and side port below the diaphragm, tip in the vicinity of the duodenal bulb on this rotated examination. Electronically Signed   By: Lauralyn Primes M.D.   On: 03/21/2021 15:41   DG Abd Portable 1V  Result Date: 03/21/2021 CLINICAL DATA:  NG tube repositioning. EXAM: PORTABLE ABDOMEN - 1 VIEW COMPARISON:  Earlier same day FINDINGS: 0752 hours. NG tube appears to be looped/coiled in the oropharynx/hypopharynx. NG tube does not extend down the esophagus. The lungs are clear without focal pneumonia, edema, pneumothorax or pleural effusion. There is pulmonary vascular congestion without overt pulmonary edema. Cardiopericardial silhouette is at upper limits of normal for size. Old right rib fractures noted. IMPRESSION: NG tube appears to be looped/coiled in the oropharynx/hypopharynx and does not extend down the esophagus. Insert PRA call report Electronically Signed   By: Kennith Center M.D.   On: 03/21/2021 10:53      Scheduled Meds:  enoxaparin (LOVENOX) injection  0.5 mg/kg Subcutaneous Q24H   feeding supplement (PROSource TF)  45 mL Per Tube TID   insulin aspart  0-20 Units Subcutaneous Q4H   insulin glargine  25 Units Subcutaneous QHS   multivitamin with minerals  1 tablet Oral Daily   Continuous Infusions:  feeding  supplement (OSMOLITE 1.5 CAL) 40 mL/hr at 03/22/21 0938     LOS: 69 days   Signature  Susa Raring M.D on 03/22/2021 at 12:24 PM   -  To page go to [email or web address]

## 2021-03-22 NOTE — Progress Notes (Addendum)
   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 Still unsure if NPH primary issues or other underlying problem due to h/o drug abuse/bipolar. I plan to update her family later today. RN said they were here yesterday- if this is irreversible she shouldn't get a PEG and should be comfort in my opinion"   HPI:  Per intake H&P --> 55 year old female past medical history of diabetes mellitus type 2, bipolar disorder type I, previous COVID infection, polysubstance abuse that includes cocaine.  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   Lira Stephen has been at [facility] for the past fifty-three days is not eating or drinking sufficiently.  Palliative care has been asked to get re-involved to further discuss goals of care in the setting of progressive failure to thrive and generalized weakness.  Today's Discussion (03/22/2021): Chart reviewed. Cortrack placed yesterday, thus far patient is tolerating supplemental nutrition.   I met with Sharyn Lull at bedside this morning. She was noted to be resting, arousable to voice. Endorses no complaints.  No family present at bedside. PMT has been updating patients father 2x weekly.   Questions and concerns addressed   Objective Assessment: Vital Signs Vitals:   03/21/21 1954 03/22/21 0410  BP: (!) 157/75 140/80  Pulse: (!) 103 94  Resp: 16 16  Temp: 97.8 F (36.6 C) 98 F (36.7 C)  SpO2: 97% 97%    Intake/Output Summary (Last 24 hours) at 03/22/2021 0741 Last data filed at 03/22/2021 0600 Gross per 24 hour  Intake 484 ml  Output --  Net 484 ml    Last Weight  Most recent update: 03/21/2021  2:56 PM     Weight  67.5 kg (148 lb 13 oz)            Gen:  Caucasian F in NAD HEENT: Dry  mucous membranes CV: Regular rate and rhythm PULM: On RA ABD: soft/nontender EXT: No edema Neuro: Responds to name  SUMMARY OF RECOMMENDATIONS   DNAR/DNI  Cortrack placed on 7/15 trial for 7 days --> Based upon tolerance additional conversations with family will ensue in the oncoming days. Will further elucidate the reality that Izela is now max assistance and her long term outlook will very possibly be her being bed-bound in a nursing home for the duration of her life  Advance Directives have been obtained and will be scanned into [employer]  Appreciate TOC - MSW helping patients father navigate Disability & Medicaid application   Plan for long term - custodial care placement  Generalized weakness --> patient no longer advancing in physical therapy   Ongoing incremental palliative care support  Time Spent : 15 Greater than 50% of the time was spent in counseling and coordination of care ______________________________________________________________________________________ [place] Team Team Cell Phone: [phone] Please utilize secure chat with additional questions, if there is no response within 30 minutes please call the above phone number  Palliative Medicine Team providers are available by phone from 7am to 7pm daily and can be reached through the team cell phone.  Should this patient require assistance outside of these hours, please call the patient's attending physician.

## 2021-03-23 DIAGNOSIS — E43 Unspecified severe protein-calorie malnutrition: Secondary | ICD-10-CM | POA: Insufficient documentation

## 2021-03-23 LAB — COMPREHENSIVE METABOLIC PANEL
ALT: 10 U/L (ref 0–44)
AST: 11 U/L — ABNORMAL LOW (ref 15–41)
Albumin: 2.9 g/dL — ABNORMAL LOW (ref 3.5–5.0)
Alkaline Phosphatase: 55 U/L (ref 38–126)
Anion gap: 8 (ref 5–15)
BUN: 24 mg/dL — ABNORMAL HIGH (ref 6–20)
CO2: 24 mmol/L (ref 22–32)
Calcium: 9.4 mg/dL (ref 8.9–10.3)
Chloride: 105 mmol/L (ref 98–111)
Creatinine, Ser: 0.78 mg/dL (ref 0.44–1.00)
GFR, Estimated: >60 mL/min (ref >60)
Glucose, Bld: 182 mg/dL — ABNORMAL HIGH (ref 70–99)
Potassium: 3.6 mmol/L (ref 3.5–5.1)
Sodium: 137 mmol/L (ref 135–145)
Total Bilirubin: 0.5 mg/dL (ref 0.3–1.2)
Total Protein: 6.4 g/dL — ABNORMAL LOW (ref 6.5–8.1)

## 2021-03-23 LAB — BRAIN NATRIURETIC PEPTIDE: B Natriuretic Peptide: 24.9 pg/mL (ref 0.0–100.0)

## 2021-03-23 LAB — CBC WITH DIFFERENTIAL/PLATELET
Abs Immature Granulocytes: 0.02 10*3/uL (ref 0.00–0.07)
Basophils Absolute: 0.1 10*3/uL (ref 0.0–0.1)
Basophils Relative: 1 %
Eosinophils Absolute: 0.4 10*3/uL (ref 0.0–0.5)
Eosinophils Relative: 4 %
HCT: 36.1 % (ref 36.0–46.0)
Hemoglobin: 11.8 g/dL — ABNORMAL LOW (ref 12.0–15.0)
Immature Granulocytes: 0 %
Lymphocytes Relative: 34 %
Lymphs Abs: 3.3 10*3/uL (ref 0.7–4.0)
MCH: 29.4 pg (ref 26.0–34.0)
MCHC: 32.7 g/dL (ref 30.0–36.0)
MCV: 89.8 fL (ref 80.0–100.0)
Monocytes Absolute: 0.9 10*3/uL (ref 0.1–1.0)
Monocytes Relative: 9 %
Neutro Abs: 5.2 10*3/uL (ref 1.7–7.7)
Neutrophils Relative %: 52 %
Platelets: 307 10*3/uL (ref 150–400)
RBC: 4.02 MIL/uL (ref 3.87–5.11)
RDW: 14.8 % (ref 11.5–15.5)
WBC: 9.9 10*3/uL (ref 4.0–10.5)
nRBC: 0 % (ref 0.0–0.2)

## 2021-03-23 LAB — GLUCOSE, CAPILLARY
Glucose-Capillary: 180 mg/dL — ABNORMAL HIGH (ref 70–99)
Glucose-Capillary: 181 mg/dL — ABNORMAL HIGH (ref 70–99)
Glucose-Capillary: 195 mg/dL — ABNORMAL HIGH (ref 70–99)
Glucose-Capillary: 253 mg/dL — ABNORMAL HIGH (ref 70–99)
Glucose-Capillary: 283 mg/dL — ABNORMAL HIGH (ref 70–99)
Glucose-Capillary: 284 mg/dL — ABNORMAL HIGH (ref 70–99)

## 2021-03-23 LAB — MAGNESIUM
Magnesium: 1.7 mg/dL (ref 1.7–2.4)
Magnesium: 1.7 mg/dL (ref 1.7–2.4)

## 2021-03-23 LAB — PHOSPHORUS
Phosphorus: 3.7 mg/dL (ref 2.5–4.6)
Phosphorus: 3.8 mg/dL (ref 2.5–4.6)

## 2021-03-23 LAB — POTASSIUM: Potassium: 4.2 mmol/L (ref 3.5–5.1)

## 2021-03-23 MED ORDER — ENOXAPARIN SODIUM 40 MG/0.4ML IJ SOSY
40.0000 mg | PREFILLED_SYRINGE | INTRAMUSCULAR | Status: DC
  Administered 2021-03-24 – 2021-04-30 (×38): 40 mg via SUBCUTANEOUS
  Filled 2021-03-23 (×38): qty 0.4

## 2021-03-23 MED ORDER — INSULIN GLARGINE 100 UNIT/ML ~~LOC~~ SOLN
20.0000 [IU] | Freq: Two times a day (BID) | SUBCUTANEOUS | Status: DC
  Administered 2021-03-23 – 2021-03-26 (×8): 20 [IU] via SUBCUTANEOUS
  Filled 2021-03-23 (×10): qty 0.2

## 2021-03-23 MED ORDER — BENZOCAINE 20 % MT AERO
INHALATION_SPRAY | Freq: Two times a day (BID) | OROMUCOSAL | Status: DC
  Administered 2021-03-26 – 2021-04-06 (×7): 1 via OROMUCOSAL
  Filled 2021-03-23: qty 57

## 2021-03-23 NOTE — Progress Notes (Addendum)
   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 Still unsure if NPH primary issues or other underlying problem due to h/o drug abuse/bipolar. I plan to update her family later today. RN said they were here yesterday- if this is irreversible she shouldn't get a PEG and should be comfort in my opinion"   HPI:  Per intake H&P --> 55 year old female past medical history of diabetes mellitus type 2, bipolar disorder type I, previous COVID infection, polysubstance abuse that includes cocaine.  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   Carolyn Norton has been at [facility] for the past fifty-three days is not eating or drinking sufficiently.  Palliative care has been asked to get re-involved to further discuss goals of care in the setting of progressive failure to thrive and generalized weakness.  Today's Discussion (03/23/2021): Chart reviewed. Cortrack placed two days ago thus far patient is tolerating supplemental nutrition.   I met with Carolyn Norton at bedside this morning. She was alert and oriented to self.She shared with me that her "throat hurts". We reviewed that she had feeding tube in her nose which goes down to her stomach.  I called patient father, Carolyn Norton. We agreed to reconvene on Thursday and have to have a conversation after the trial of tube feeding to re-evaluate where we are at. I shared with him my concerns about her poor overall health condition and he ongoing decline since admission. He verbalized understanding.   Questions and concerns addressed   Objective Assessment: Vital Signs Vitals:   03/22/21 2000 03/23/21 0406  BP: (!) 153/85 139/80   Pulse: (!) 108 (!) 101  Resp: 18 18  Temp: 98.8 F (37.1 C) 98.3 F (36.8 C)  SpO2: 100% 99%    Intake/Output Summary (Last 24 hours) at 03/23/2021 1057 Last data filed at 03/23/2021 3267 Gross per 24 hour  Intake 1305.66 ml  Output 1000 ml  Net 305.66 ml    Last Weight  Most recent update: 03/23/2021  4:33 AM    Weight  68.1 kg (150 lb 2.1 oz)            Gen:  Caucasian F in NAD HEENT: Dry  mucous membranes CV: Regular rate and rhythm PULM: On RA ABD: soft/nontender EXT: No edema Neuro: Responds to name  SUMMARY OF RECOMMENDATIONS   DNAR/DNI  Cortrack placed on 7/15 trial for 7 days  Plan to speak to patients father, Carolyn Norton on Thursday  Advance Directives have been obtained and will be scanned into [employer]   Ongoing incremental palliative care support  Symptoms: Throat Pain: - Hurricane spray BID  Time Spent : 25 Greater than 50% of the time was spent in counseling and coordination of care ______________________________________________________________________________________ [place] Team Team Cell Phone: [phone] Please utilize secure chat with additional questions, if there is no response within 30 minutes please call the above phone number  Palliative Medicine Team providers are available by phone from 7am to 7pm daily and can be reached through the team cell phone.  Should this patient require assistance outside of these hours, please call the patient's attending physician.

## 2021-03-23 NOTE — Progress Notes (Signed)
PROGRESS NOTE    Carolyn Norton   UJW:[id number]  DOB: 1965-12-31  DOA: 01/11/2021 PCP: Dartha LodgeSteele, Anthony, FNP   Brief Narrative:  Carolyn PerchesMichelle Lee Stanleyis a 55 year old female with past medical history of diabetes mellitus type 2, bipolar disorder, cocaine abuse, cognitive impairment who lives with her parents and is taken care of by them. She was unable to give a history in the ED.  She presented from home with hyperglycemia (CBG 700s), steady cognitive and function decline at home  with inability to ambulate with multiple falls over the past 3 weeks. She had been confused for 4 months (since she had COVID). She had stopped taking her meds 3 days prior to coming in which appeared to be the reason her parents brought her. She was unable to ambulate in the ED and had hyperglycemia (CBG 729).  [facility]  Her hospital stay has been complicated by progressive cognitive and functional decline.  She has had psychiatry, palliative care, neurology and neurosurgery consults but no cause for her rapid cognitive decline has been found.  At this point, PT has signed off not recommending her for SNF, but for some kind if long term care.  In discussion with patient's father, no plans for long-term feeding tube, but requested to try NG tube.  Nursing team unable to place NG tube due to patient compliance.  Coretrak team consulted.  Patient about the same.  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  She was  subsequently seen by neurology along with neurosurgery for suspicion of NPH, multiple large-volume LPs were done and eventually on 03/06/2021 clinically and after an LP neurology finally ruled out NPH.  They recommend outpatient neurology follow-up for further work-up if needed.  Currently mental status has not much changed, neurology now thinks that she has underlying dementia and she will benefit from supportive care and outpatient neurology follow-up.  Unfortunately her cognitive skills are gradually declining.  Poor oral intake - weight on 5/7- 99.8 kg- weight today 67.7 kg, severe protein calorie malnutrition.  Initially placed on Megace but this was discontinued after hyperglycemia, father who is the chief decision-maker was consulted and he requested 7-day trial of NG tube with tube feeds which will be done.  However I do not think this is a long-term strategy as after 7 days of nutrition she will most likely be back to the present baseline.  Father is open for comfort measures if she declines in the future.   Acute respiratory distress on 03/15/2021.  Due to aspiration pneumonitis.  Resolved.    COPD - no acute issues with this   E. coli and Klebsiella UTI- ~ 6/27  - Has been treated   Bipolar disorder - she was on - Lexapro, Depakote and gabapentin a renewed on 03/05/2021 for recurrent sedation pain   Severe lower extremity weakness (starting at least 1 month prior to admission)  -this is due to severe deconditioning, she had not been ambulating at home for several weeks prior to hospital visit, seen by  neurology underwent extensive work-up including MRI's cervical thoracic lumbar spine nonrevealing, continue PT OT and supportive care.   History of polysubstance abuse  - Including cocaine and alcohol, On 2/15 she was cocaine and benzodiazepine positive.  She has been extensively counseled to quit all.  Diabetes mellitus type 2 uncontrolled - Hemoglobin A1c is 11 on 01/12/21 suggesting poor  outpatient control due to hyperglycemia. On Lantus and sliding scale, adjusted on 03/22/2021  CBG (last 3)  Recent Labs    03/22/21 2346 03/23/21 0417 03/23/21 0737  GLUCAP 182* 253* 195*    DVT prophylaxis: SCD's Start: 02/14/21 0829, Lovenox Code Status: DNR Family Communication: Updated father Ephriam Knuckles [phone] - by phone on 03/22/21 Level of Care: Level of care: Med-Surg Disposition Plan:  Status is: Inpatient  Remains inpatient appropriate because:Unsafe d/c plan  Dispo:  Patient From:  home with parents  Planned Disposition: Long-term care facility.  Medically stable for discharge: No    Antimicrobials:  Anti-infectives (From admission, onward)    Start     Dose/Rate Route Frequency Ordered Stop   03/06/21 0900  ceFAZolin (ANCEF) IVPB 1 g/50 mL premix        1 g 100 mL/hr over 30 Minutes Intravenous Every 8 hours 03/06/21 0801 03/10/21 0559   03/04/21 1430  cefTRIAXone (ROCEPHIN) 1 g in sodium chloride 0.9 % 100 mL IVPB  Status:  Discontinued        1 g 200 mL/hr over 30 Minutes Intravenous Every 24 hours 03/04/21 1335 03/06/21 0800   02/14/21 0828  vancomycin (VANCOREADY) IVPB 1000 mg/200 mL  Status:  Discontinued        1,000 mg 200 mL/hr over 60 Minutes Intravenous 60 min pre-op 02/14/21 0828 03/02/21 1139   02/13/21 2115  cefTRIAXone (ROCEPHIN) 1 g in sodium chloride 0.9 % 100 mL IVPB  Status:  Discontinued        1 g 200 mL/hr over 30 Minutes Intravenous Every 24 hours 02/13/21 2016 02/16/21 0844        Objective: Vitals:   03/22/21 1513 03/22/21 2000 03/23/21 0406 03/23/21 0433  BP: 133/77 (!) 153/85 139/80   Pulse: 97 (!) 108 (!) 101   Resp: 18 18 18    Temp: 97.9 F (36.6 C) 98.8 F (37.1 C) 98.3 F (36.8 C)   TempSrc:  Oral    SpO2: 100% 100% 99%   Weight:    68.1 kg  Height:        Intake/Output Summary (Last 24 hours) at 03/23/2021 0955 Last data filed at 03/23/2021 03/25/2021 Gross per 24 hour  Intake 1305.66 ml  Output 1000 ml  Net 305.66  ml   Filed Weights   01/11/21 1152 03/21/21 1456 03/23/21 0433  Weight: 99.8 kg 67.5 kg 68.1 kg    Examination:  Awake but not completely alert, follows commands inconsistently, NG in place [place].AT,[name] Supple Neck,No JVD, No cervical lymphadenopathy appriciated.  Symmetrical Chest wall movement, Good air movement bilaterally, CTAB RRR,No Gallops, Rubs or new Murmurs, No Parasternal Heave +ve B.Sounds, Abd Soft, No tenderness, No organomegaly appriciated, No rebound - guarding or rigidity. No Cyanosis, Clubbing or edema, No new Rash or bruise    Data Reviewed: I have personally reviewed following labs and imaging studies  CBC: Recent Labs  Lab 03/17/21 0218 03/20/21 0215 03/23/21 0654  WBC 11.3* 9.1 9.9  NEUTROABS  --   --  5.2  HGB 12.8 13.6 11.8*  HCT 38.6 40.3 36.1  MCV 87.5 87.8 89.8  PLT 417*  377 307   Basic Metabolic Panel: Recent Labs  Lab 03/16/21 2207 03/17/21 0218 03/18/21 0258 03/21/21 1647 03/22/21 0510 03/22/21 1635 03/23/21 0654  NA 130* 138 137  --   --   --  137  K 4.8 3.3* 3.5 3.6 4.0 4.3 3.6  CL 97* 102 101  --   --   --  105  CO2 25 25 25   --   --   --  24  GLUCOSE 680* 292* 121*  --   --   --  182*  BUN 24* 21* 20  --   --   --  24*  CREATININE 1.27* 1.09* 0.97  --   --   --  0.78  CALCIUM 9.3 10.0 9.9  --   --   --  9.4  MG  --  1.7  --  1.7 1.7 1.8 1.7  PHOS  --   --   --  2.8 4.1 3.5 3.7   GFR: Estimated Creatinine Clearance: 76.1 mL/min (by C-G formula based on SCr of 0.78 mg/dL). Liver Function Tests: Recent Labs  Lab 03/23/21 0654  AST 11*  ALT 10  ALKPHOS 55  BILITOT 0.5  PROT 6.4*  ALBUMIN 2.9*   No results for input(s): LIPASE, AMYLASE in the last 168 hours. No results for input(s): AMMONIA in the last 168 hours. Coagulation Profile: No results for input(s): INR, PROTIME in the last 168 hours. Cardiac Enzymes: No results for input(s): CKTOTAL, CKMB, CKMBINDEX, TROPONINI in the last 168 hours. BNP (last 3  results) No results for input(s): PROBNP in the last 8760 hours. HbA1C: No results for input(s): HGBA1C in the last 72 hours.  CBG: Recent Labs  Lab 03/22/21 1518 03/22/21 2007 03/22/21 2346 03/23/21 0417 03/23/21 0737  GLUCAP 354* 233* 182* 253* 195*   Lipid Profile: No results for input(s): CHOL, HDL, LDLCALC, TRIG, CHOLHDL, LDLDIRECT in the last 72 hours. Thyroid Function Tests: No results for input(s): TSH, T4TOTAL, FREET4, T3FREE, THYROIDAB in the last 72 hours. Anemia Panel: No results for input(s): VITAMINB12, FOLATE, FERRITIN, TIBC, IRON, RETICCTPCT in the last 72 hours. Urine analysis:    Component Value Date/Time   COLORURINE YELLOW 03/03/2021 0810   APPEARANCEUR CLEAR 03/03/2021 0810   LABSPEC 1.022 03/03/2021 0810   PHURINE 5.0 03/03/2021 0810   GLUCOSEU >=500 (A) 03/03/2021 0810   HGBUR NEGATIVE 03/03/2021 0810   BILIRUBINUR NEGATIVE 03/03/2021 0810   KETONESUR 80 (A) 03/03/2021 0810   PROTEINUR NEGATIVE 03/03/2021 0810   UROBILINOGEN 0.2 07/20/2014 2022   NITRITE NEGATIVE 03/03/2021 0810   LEUKOCYTESUR SMALL (A) 03/03/2021 0810   Sepsis Labs: @LABRCNTIP (procalcitonin:4,lacticidven:4) ) No results found for this or any previous visit (from the past 240 hour(s)).    Radiology Studies: DG Abd Portable 1V  Result Date: 03/21/2021 CLINICAL DATA:  Feeding tube placement EXAM: PORTABLE ABDOMEN - 1 VIEW COMPARISON:  None. FINDINGS: Nonobstructive pattern of bowel gas. Enteric contrast in the colon. Enteric feeding tube is positioned with tip and side port below the diaphragm, tip in the vicinity of the duodenal bulb on this rotated examination. IMPRESSION: Enteric feeding tube is positioned with tip and side port below the diaphragm, tip in the vicinity of the duodenal bulb on this rotated examination. Electronically Signed   By: M.D.   On: 03/21/2021 15:41      Scheduled Meds:  enoxaparin (LOVENOX) injection  0.5 mg/kg Subcutaneous Q24H    feeding supplement (PROSource TF)  45 mL Per Tube TID  insulin aspart  0-20 Units Subcutaneous Q4H   insulin glargine  20 Units Subcutaneous BID   multivitamin with minerals  1 tablet Oral Daily   Continuous Infusions:  feeding supplement (OSMOLITE 1.5 CAL) 50 mL/hr at 03/22/21 2000     LOS: 70 days   Signature  Susa Raring M.D on 03/23/2021 at 9:55 AM   -  To page go to [email or web address]

## 2021-03-23 NOTE — Plan of Care (Signed)
  Problem: Clinical Measurements: Goal: Respiratory complications will improve Outcome: Progressing   Problem: Clinical Measurements: Goal: Cardiovascular complication will be avoided Outcome: Progressing   Problem: Nutrition: Goal: Adequate nutrition will be maintained Outcome: Progressing   

## 2021-03-24 LAB — COMPREHENSIVE METABOLIC PANEL
ALT: 9 U/L (ref 0–44)
AST: 11 U/L — ABNORMAL LOW (ref 15–41)
Albumin: 2.8 g/dL — ABNORMAL LOW (ref 3.5–5.0)
Alkaline Phosphatase: 60 U/L (ref 38–126)
Anion gap: 7 (ref 5–15)
BUN: 21 mg/dL — ABNORMAL HIGH (ref 6–20)
CO2: 26 mmol/L (ref 22–32)
Calcium: 9.4 mg/dL (ref 8.9–10.3)
Chloride: 102 mmol/L (ref 98–111)
Creatinine, Ser: 0.75 mg/dL (ref 0.44–1.00)
GFR, Estimated: >60 mL/min (ref >60)
Glucose, Bld: 237 mg/dL — ABNORMAL HIGH (ref 70–99)
Potassium: 4.1 mmol/L (ref 3.5–5.1)
Sodium: 135 mmol/L (ref 135–145)
Total Bilirubin: 0.6 mg/dL (ref 0.3–1.2)
Total Protein: 6.2 g/dL — ABNORMAL LOW (ref 6.5–8.1)

## 2021-03-24 LAB — CBC WITH DIFFERENTIAL/PLATELET
Abs Immature Granulocytes: 0.03 10*3/uL (ref 0.00–0.07)
Basophils Absolute: 0.1 10*3/uL (ref 0.0–0.1)
Basophils Relative: 1 %
Eosinophils Absolute: 0.4 10*3/uL (ref 0.0–0.5)
Eosinophils Relative: 4 %
HCT: 35.4 % — ABNORMAL LOW (ref 36.0–46.0)
Hemoglobin: 11.8 g/dL — ABNORMAL LOW (ref 12.0–15.0)
Immature Granulocytes: 0 %
Lymphocytes Relative: 33 %
Lymphs Abs: 3 10*3/uL (ref 0.7–4.0)
MCH: 29.7 pg (ref 26.0–34.0)
MCHC: 33.3 g/dL (ref 30.0–36.0)
MCV: 89.2 fL (ref 80.0–100.0)
Monocytes Absolute: 0.9 10*3/uL (ref 0.1–1.0)
Monocytes Relative: 9 %
Neutro Abs: 4.8 10*3/uL (ref 1.7–7.7)
Neutrophils Relative %: 53 %
Platelets: 308 10*3/uL (ref 150–400)
RBC: 3.97 MIL/uL (ref 3.87–5.11)
RDW: 14.6 % (ref 11.5–15.5)
WBC: 9.2 10*3/uL (ref 4.0–10.5)
nRBC: 0 % (ref 0.0–0.2)

## 2021-03-24 LAB — GLUCOSE, CAPILLARY
Glucose-Capillary: 131 mg/dL — ABNORMAL HIGH (ref 70–99)
Glucose-Capillary: 190 mg/dL — ABNORMAL HIGH (ref 70–99)
Glucose-Capillary: 211 mg/dL — ABNORMAL HIGH (ref 70–99)
Glucose-Capillary: 227 mg/dL — ABNORMAL HIGH (ref 70–99)
Glucose-Capillary: 222 mg/dL — ABNORMAL HIGH (ref 70–99)
Glucose-Capillary: 256 mg/dL — ABNORMAL HIGH (ref 70–99)

## 2021-03-24 LAB — MAGNESIUM: Magnesium: 1.7 mg/dL (ref 1.7–2.4)

## 2021-03-24 LAB — PHOSPHORUS: Phosphorus: 4.1 mg/dL (ref 2.5–4.6)

## 2021-03-24 LAB — BRAIN NATRIURETIC PEPTIDE: B Natriuretic Peptide: 29.2 pg/mL (ref 0.0–100.0)

## 2021-03-24 MED ORDER — HYDRALAZINE HCL 25 MG PO TABS: 25.0000 mg | ORAL_TABLET | Freq: Four times a day (QID) | ORAL | Status: DC | PRN

## 2021-03-24 NOTE — Progress Notes (Signed)
[facility] PROGRESS NOTE  Carolyn Norton GUR:[id number] DOB: 1965-12-14 DOA: 01/11/2021 PCP: [name], FNP      Status: Remains inpatient appropriate because:Unsafe d/c plan-due to patient's underlying poor mental status short-term memory deficits she will require 24/7 care after discharge  Dispo:  Patient From:  Home  Planned Disposition: SNF  Medically stable for discharge:  yes  Barriers to DC: Continues to require significant assistance with mobility.  Continues to have significant short-term memory deficits s significant progressive decline in mobility impeding patient's ability to safely return home with elderly parents; please note that the patient's father continues to work outside of the home therefore the burden of care would follow-up on the patient's elderly mother             Difficult to place: Yes  Level of care: Med-Surg  Code Status: DNR Family Communication: Father 7/7 updated on changes in dtrs status DVT prophylaxis: Lovenox COVID vaccination status: Unknown-post COVID infection 09/06/20   HPI: 55 year old female past medical history of diabetes mellitus type 2, bipolar disorder type I, previous COVID infection, polysubstance abuse that includes cocaine.  She presented from home where she lives with her parents.  Her presenting symptoms included generalized weakness, multiple falls and confusion.  Family confirmed that she had been having the symptoms for at least a month with last use of crack cocaine 1 month prior to presentation.  Ever since that time she had not been doing well.  She would also be having issues with hyperglycemia.  In the ER she was diagnosed with severe dehydration, hyperglycemia and metabolic encephalopathy.  Since admission patient has been evaluated by the psychiatric team who has deemed her alert and oriented and having capacity to make decisions.  Patient is agreeable to placement at a rehab.  Currently she has  severe physical deconditioning which was POA and PT is recommending SNF.  Because of her history of polysubstance abuse been difficult to obtain an SNF bed.  Initially her family is agreeable to taking her back in home when she is able to ambulate well enough to get back and forth for meals into the bathroom.  Unfortunately her cognitive and physical status has progressively declined during this admission.  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  Palliative medicine is continuing to follow along.  Patient's previous advanced directives stated she did not want a PEG tube if she were unable to eat.  Palliative medicine has had multiple meetings with the family on 7/15 decision was made to place core track tube for short-term feedings to see if patient demonstrates any improvement in symptoms.  Subjective: Currently awakens and is very pleasant.  Does respond with simple answers such as "hey" but after that is unable to answer orientation questions or follow commands with her extremities.  She was able to feel me touching her left leg and said yes but was unable to localize where the right or left leg   Objective: Vitals:   03/24/21 0507 03/24/21 0720  BP: (!) 153/86 (!) 156/87  Pulse: 92 89  Resp: 20 19  Temp: 97.9 F (36.6 C) 98.7 F (37.1 C)  SpO2:  99%    Intake/Output Summary (Last 24 hours) at 03/24/2021 0953 Last data filed at 03/23/2021 2051 Gross per 24 hour  Intake 550 ml  Output 375 ml  Net 175 ml    Filed Weights   01/11/21  1152 03/21/21 1456 03/23/21 0433  Weight: 99.8 kg 67.5 kg 68.1 kg    Exam:  Constitutional: Awakens and is pleasant and initially smiles and interactive but after that unable to engage in regards to answering questions or following simple Respiratory: Anterior lung sounds remain clear to auscultation, stable on 2 L O2 without tachypnea or increased work of Cardiac:  S1-S2, no peripheral edema, pulses regular without any resting tachycardia Abdomen: LBM 7/15, cortrack tube in place with tube feedings at 50 cc/h.  Soft nontender with normoactive bowel sounds. Neurologic: Awake, cranial nerves appear to be grossly intact.  Unable to follow simple commands so therefore unable to accurately assess strength.  Sensation is intact as documented above.  Previously documented metria with attempts to feed self. Psychiatric: Awake and smiled briefly and responded to my entry when I spoke to her but otherwise was not answering orientation questions.  When I touched one of her legs she was able to verbalize yes but was unable to localize which leg was being touched.  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  Initially felt to be partially due to NPH physiology given after initial large-volume LP procedure she did have some improvement in mobility and cognition.  Repeat large-volume LP without similar results.  MRI of her spine without any abnormalities to explain her progressive lower extremity weakness. 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  Patient was diagnosed with uncomplicated COVID on 09/06/2020.  Patient presented to the ER in February with seizure-like activity secondary to recent cocaine use and associated dehydration.  According to notes at time of admission in May (current admission) she has had confusion since diagnosed with COVID which has worsened.  Her father also reported multiple falls and poorly controlled diabetes with CBGs in the 60s.  Dysphagia/failure to thrive secondary  to multifactorial neurological condition Appetite did not improve with Megace and this was discontinued secondary to significant hyperglycemia Patient has advanced directive in place prior to admission that documented no PEG tube Family has decided to begin a trial of enteral feedings via core track tube to see if this will help patient improve Appreciate palliative assistance  Acute hypoxemic respiratory failure with underlying COPD Continue 2 L nasal cannula oxygen Change scheduled duo nebs to prn Patient was on Advair prior to admission (LABA)-unable to utilize MDIs secondary to altered mental status -begin budesonide nebs BID  Diabetes mellitus 2 with hyperglycemia on insulin Hemoglobin A1c 11.0 Since Megace was not helping and was contributing to severe hyperglycemia this medication was discontinued Current CBGs have ranged between 131 and 283 Continue Lantus 20 units twice daily Continue to follow CBG every 4 hours with resistant SSI Continue Glucerna shakes  Elevated blood pressure No prior documented history of hypertension  SBP's remain in the 150s with DBP's in the 80s Increase hydralazine to 25 mg every 6 hours BNP normal do not suspect heart failure as etiology to hypoxemia No prior echocardiogram available in epic  Profound physical deconditioning/critical illness myopathy Likely a combination of persistent altered mental status and inability to participate consistently with PT as well as myopathy of critical illness  History of bipolar 1 disorder/polysubstance abuse/cognitive impairment Continue to hold home escitalopram, Depakote and Neurontin.  Ventriculomegaly/? NPH NPH ruled out as above  Dyslipidemia Hold statin until oral intake improves  Physical deconditioning/bilateral heel cord shortening Continue bilateral PRAFO boots on every 4 hours and off every 4 hours    Other problems: GERD Oral PPI on hold while  n.p.o.  Hypokalemia/hypomagnesemia Resolved  E. Coli [name] UTI Treated Use purewick only at bedtime    Data Reviewed: Basic Metabolic Panel: Recent Labs  Lab 03/18/21 0258 03/21/21 1647 03/22/21 0510 03/22/21 1635 03/23/21 0654 03/23/21 1624 03/24/21 0856  NA 137  --   --   --  137  --  135  K 3.5   < > 4.0 4.3 3.6 4.2 4.1  CL 101  --   --   --  105  --  102  CO2 25  --   --   --  24  --  26  GLUCOSE 121*  --   --   --  182*  --  237*  BUN 20  --   --   --  24*  --  21*  CREATININE 0.97  --   --   --  0.78  --  0.75  CALCIUM 9.9  --   --   --  9.4  --  9.4  MG  --    < > 1.7 1.8 1.7 1.7 1.7  PHOS  --    < > 4.1 3.5 3.7 3.8 4.1   < > = values in this interval not displayed.   Liver Function Tests: Recent Labs  Lab 03/23/21 0654 03/24/21 0856  AST 11* 11*  ALT 10 9  ALKPHOS 55 60  BILITOT 0.5 0.6  PROT 6.4* 6.2*  ALBUMIN 2.9* 2.8*    No results for input(s): LIPASE, AMYLASE in the last 168 hours. No results for input(s): AMMONIA in the last 168 hours. CBC: Recent Labs  Lab 03/20/21 0215 03/23/21 0654 03/24/21 0856  WBC 9.1 9.9 9.2  NEUTROABS  --  5.2 4.8  HGB 13.6 11.8* 11.8*  HCT 40.3 36.1 35.4*  MCV 87.8 89.8 89.2  PLT 377 307 308   Cardiac Enzymes: No results for input(s): CKTOTAL, CKMB, CKMBINDEX, TROPONINI in the last 168 hours. BNP (last 3 results) Recent Labs    01/15/21 0230 01/16/21 0201 03/23/21 0654  BNP 115.9* 40.9 24.9    ProBNP (last 3 results) No results for input(s): PROBNP in the last 8760 hours.  CBG: Recent Labs  Lab 03/23/21 1612 03/23/21 1906 03/23/21 2336 03/24/21 0320 03/24/21 0721  GLUCAP 181* 180* 283* 131* 190*    No results found for this or any previous visit (from the past 240 hour(s)).     Studies: No results found.  Scheduled Meds:  Benzocaine   Mouth/Throat BID   enoxaparin (LOVENOX) injection  40 mg Subcutaneous Q24H   feeding supplement (PROSource TF)  45 mL Per Tube TID   insulin  aspart  0-20 Units Subcutaneous Q4H   insulin glargine  20 Units Subcutaneous BID   multivitamin with minerals  1 tablet Oral Daily   Continuous Infusions:  feeding supplement (OSMOLITE 1.5 CAL) 50 mL/hr at 03/23/21 2043      Principal Problem:   Bipolar 1 disorder, depressed, full remission (HCC) Active Problems:   Bipolar 1 disorder (HCC)   Hypertension   Generalized weakness   Controlled type 2 diabetes mellitus with hyperglycemia, with long-term current use of insulin (HCC)   Hyperlipidemia   NPH (normal pressure hydrocephalus) (HCC)   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   Protein-calorie malnutrition, severe  Consultants: Psychiatry Neurology Neurosurgery  Procedures: None  Antibiotics: Anti-infectives (From admission, onward)    Start     Dose/Rate Route Frequency Ordered Stop   03/06/21 0900  ceFAZolin (ANCEF) IVPB 1 g/50 mL premix        1 g 100 mL/hr over 30 Minutes Intravenous Every 8 hours 03/06/21 0801 03/10/21 0559   03/04/21 1430  cefTRIAXone (ROCEPHIN) 1 g in sodium chloride 0.9 % 100 mL IVPB  Status:  Discontinued        1 g 200 mL/hr over 30 Minutes Intravenous Every 24 hours 03/04/21 1335 03/06/21 0800   02/14/21 0828  vancomycin (VANCOREADY) IVPB 1000 mg/200 mL  Status:  Discontinued        1,000 mg 200 mL/hr over 60 Minutes Intravenous 60 min pre-op 02/14/21 0828 03/02/21 1139   02/13/21 2115  cefTRIAXone (ROCEPHIN) 1 g in sodium chloride 0.9 % 100 mL IVPB  Status:  Discontinued        1 g 200 mL/hr over 30 Minutes Intravenous Every 24 hours 02/13/21 2016 02/16/21 0844        Time spent: 25 minutes    Junious SilkAllison Nuriyah Hanline ANP  [facility] 7 am - 330 pm/M-F for direct patient care and secure chat Please refer to Amion for contact info 71  days

## 2021-03-24 NOTE — Plan of Care (Signed)
  Problem: Clinical Measurements: Goal: Respiratory complications will improve Outcome: Progressing   Problem: Clinical Measurements: Goal: Cardiovascular complication will be avoided Outcome: Progressing   Problem: Elimination: Goal: Will not experience complications related to bowel motility Outcome: Progressing   Problem: Elimination: Goal: Will not experience complications related to urinary retention Outcome: Progressing   Problem: Pain Managment: Goal: General experience of comfort will improve Outcome: Progressing   

## 2021-03-25 LAB — CBC WITH DIFFERENTIAL/PLATELET
Abs Immature Granulocytes: 0.03 10*3/uL (ref 0.00–0.07)
Basophils Absolute: 0.1 10*3/uL (ref 0.0–0.1)
Basophils Relative: 1 %
Eosinophils Absolute: 0.3 10*3/uL (ref 0.0–0.5)
Eosinophils Relative: 4 %
HCT: 35.4 % — ABNORMAL LOW (ref 36.0–46.0)
Hemoglobin: 11.7 g/dL — ABNORMAL LOW (ref 12.0–15.0)
Immature Granulocytes: 0 %
Lymphocytes Relative: 41 %
Lymphs Abs: 3.7 10*3/uL (ref 0.7–4.0)
MCH: 29.6 pg (ref 26.0–34.0)
MCHC: 33.1 g/dL (ref 30.0–36.0)
MCV: 89.6 fL (ref 80.0–100.0)
Monocytes Absolute: 0.8 10*3/uL (ref 0.1–1.0)
Monocytes Relative: 9 %
Neutro Abs: 4.1 10*3/uL (ref 1.7–7.7)
Neutrophils Relative %: 45 %
Platelets: 310 10*3/uL (ref 150–400)
RBC: 3.95 MIL/uL (ref 3.87–5.11)
RDW: 14.3 % (ref 11.5–15.5)
WBC: 8.9 10*3/uL (ref 4.0–10.5)
nRBC: 0 % (ref 0.0–0.2)

## 2021-03-25 LAB — COMPREHENSIVE METABOLIC PANEL
ALT: 10 U/L (ref 0–44)
AST: 12 U/L — ABNORMAL LOW (ref 15–41)
Albumin: 3 g/dL — ABNORMAL LOW (ref 3.5–5.0)
Alkaline Phosphatase: 62 U/L (ref 38–126)
Anion gap: 8 (ref 5–15)
BUN: 25 mg/dL — ABNORMAL HIGH (ref 6–20)
CO2: 26 mmol/L (ref 22–32)
Calcium: 9.7 mg/dL (ref 8.9–10.3)
Chloride: 102 mmol/L (ref 98–111)
Creatinine, Ser: 0.69 mg/dL (ref 0.44–1.00)
GFR, Estimated: >60 mL/min (ref >60)
Glucose, Bld: 152 mg/dL — ABNORMAL HIGH (ref 70–99)
Potassium: 3.7 mmol/L (ref 3.5–5.1)
Sodium: 136 mmol/L (ref 135–145)
Total Bilirubin: 0.4 mg/dL (ref 0.3–1.2)
Total Protein: 6.7 g/dL (ref 6.5–8.1)

## 2021-03-25 LAB — GLUCOSE, CAPILLARY
Glucose-Capillary: 134 mg/dL — ABNORMAL HIGH (ref 70–99)
Glucose-Capillary: 180 mg/dL — ABNORMAL HIGH (ref 70–99)
Glucose-Capillary: 303 mg/dL — ABNORMAL HIGH (ref 70–99)
Glucose-Capillary: 167 mg/dL — ABNORMAL HIGH (ref 70–99)
Glucose-Capillary: 82 mg/dL (ref 70–99)

## 2021-03-25 LAB — MAGNESIUM: Magnesium: 1.7 mg/dL (ref 1.7–2.4)

## 2021-03-25 LAB — BRAIN NATRIURETIC PEPTIDE: B Natriuretic Peptide: 33.9 pg/mL (ref 0.0–100.0)

## 2021-03-25 MED ORDER — ADULT MULTIVITAMIN LIQUID CH
15.0000 mL | Freq: Every day | ORAL | Status: DC
  Administered 2021-03-25 – 2021-03-29 (×5): 15 mL
  Filled 2021-03-25 (×6): qty 15

## 2021-03-25 NOTE — Plan of Care (Signed)
  Problem: Elimination: Goal: Will not experience complications related to urinary retention Outcome: Progressing   Problem: Pain Managment: Goal: General experience of comfort will improve Outcome: Progressing   Problem: Safety: Goal: Ability to remain free from injury will improve Outcome: Progressing   

## 2021-03-25 NOTE — Progress Notes (Signed)
PROGRESS NOTE    Carolyn Norton   CHY:[id number]  DOB: 06/03/1966  DOA: 01/11/2021 PCP: [name], FNP   Brief Narrative:  Carolyn Norton a 55 year old female with past medical history of diabetes mellitus type 2, bipolar disorder, cocaine abuse, cognitive impairment who lives with her parents and is taken care of by them. She was unable to give a history in the ED.  She presented from home with hyperglycemia (CBG 700s), steady cognitive and function decline at home  with inability to ambulate with multiple falls over the past 3 weeks. She had been confused for 4 months (since she had COVID). She had stopped taking her meds 3 days prior to coming in which appeared to be the reason her parents brought her. She was unable to ambulate in the ED and had hyperglycemia (CBG 729).  She was admitted to [facility].  Her hospital stay has been complicated by progressive cognitive and functional decline.  She has had psychiatry, palliative care, neurology and neurosurgery consults but no cause for her rapid cognitive decline has been found.  At this point, PT has signed off not recommending her for SNF, but for some kind if long term care.  In discussion with patient's father, no plans for long-term feeding tube, but requested to try NG tube.  Nursing team unable to place NG tube due to patient compliance.  [facility] team consulted.  Patient about the same.  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  She  was subsequently seen by neurology along with neurosurgery for suspicion of NPH, multiple large-volume LPs were done and eventually on 03/06/2021 clinically and after an LP neurology finally ruled out NPH.  They recommend outpatient neurology follow-up for further work-up if needed.  Currently mental status has not much changed, neurology now thinks that she has underlying dementia and she will benefit from supportive care and outpatient neurology follow-up.  Unfortunately her cognitive skills are gradually declining.  Poor oral intake - weight on 5/7- 99.8 kg- weight today 67.7 kg, severe protein calorie malnutrition.  Initially placed on Megace but this was discontinued after hyperglycemia, father who is the chief decision-maker was consulted and he requested 7-day trial of NG tube with tube feeds which will be done.  However I do not think this is a long-term strategy as after 7 days of nutrition she will most likely be back to the present baseline.  Detailed discussion with patient's father [name] on 03/25/2021, he saw the patient last few days and agrees that she has made almost no improvement with tube feedings, he is open for full comfort measures if there is no improvement by Friday or Saturday.    Acute respiratory distress on 03/15/2021.  Due to aspiration pneumonitis.  Resolved.    COPD - no acute issues with this   E. coli and Klebsiella UTI- ~ 6/27  - Has been treated   Bipolar disorder - she was on - Lexapro, Depakote and gabapentin a renewed on 03/05/2021 for recurrent sedation pain   Severe  lower extremity weakness (starting at least 1 month prior to admission)  -this is due to severe deconditioning, she had not been ambulating at home for several weeks prior to hospital visit, seen by neurology underwent extensive work-up including MRI's cervical thoracic lumbar spine nonrevealing, continue PT OT and supportive care.   History of polysubstance abuse  - Including cocaine and alcohol,  On 2/15 she was cocaine and benzodiazepine positive.  She has been extensively counseled to quit all.  DM2 uncontrolled - Hemoglobin A1c is 11 on 01/12/21 suggesting poor outpatient control due to hyperglycemia. On Lantus and sliding scale, adjusted on 03/22/2021  CBG (last 3)  Recent Labs    03/24/21 2304 03/25/21 0305 03/25/21 0739  GLUCAP 256* 134* 180*    DVT prophylaxis: enoxaparin (LOVENOX) injection 40 mg Start: 03/24/21 1000 SCD's Start: 02/14/21 0829, Lovenox Code Status: DNR Family Communication: Updated father Ephriam KnucklesClifton [phone] - by phone on 03/22/21, 03/25/21 Level of Care: Level of care: Med-Surg Disposition Plan:  Status is: Inpatient  Remains inpatient appropriate because:Unsafe d/c plan  Dispo:  Patient From:  home with parents  Planned Disposition: Long-term care facility.  Medically stable for discharge: No    Antimicrobials:  Anti-infectives (From admission, onward)    Start     Dose/Rate Route Frequency Ordered Stop   03/06/21 0900  ceFAZolin (ANCEF) IVPB 1 g/50 mL premix        1 g 100 mL/hr over 30 Minutes Intravenous Every 8 hours 03/06/21 0801 03/10/21 0559   03/04/21 1430  cefTRIAXone (ROCEPHIN) 1 g in sodium chloride 0.9 % 100 mL IVPB  Status:  Discontinued        1 g 200 mL/hr over 30 Minutes Intravenous Every 24 hours 03/04/21 1335 03/06/21 0800   02/14/21 0828  vancomycin (VANCOREADY) IVPB 1000 mg/200 mL  Status:  Discontinued        1,000 mg 200 mL/hr over 60 Minutes Intravenous 60 min pre-op 02/14/21 0828 03/02/21 1139   02/13/21 2115  cefTRIAXone (ROCEPHIN) 1 g in sodium chloride 0.9 % 100 mL IVPB  Status:  Discontinued        1 g 200 mL/hr over 30 Minutes Intravenous Every 24 hours 02/13/21 2016 02/16/21 0844        Objective: Vitals:   03/24/21 0720 03/24/21 1714 03/24/21 2028 03/25/21 0514  BP: (!) 156/87 (!) 145/87 (!) 154/84 (!) 144/86  Pulse: 89 91 93 90  Resp: 19 19 20 20   Temp: 98.7 F (37.1 C) 99 F (37.2 C) 97.9 F  (36.6 C) 97.9 F (36.6 C)  TempSrc:    Oral  SpO2: 99% 98% 99% 99%  Weight:      Height:        Intake/Output Summary (Last 24 hours) at 03/25/2021 1038 Last data filed at 03/25/2021 0500 Gross per 24 hour  Intake --  Output 825 ml  Net -825 ml   Filed Weights   01/11/21 1152 03/21/21 1456 03/23/21 0433  Weight: 99.8 kg 67.5 kg 68.1 kg    Examination:  Awake but not completely alert, follows commands inconsistently, NG in place [place].AT,[name] Supple Neck,No JVD, No cervical lymphadenopathy appriciated.  Symmetrical Chest wall movement, Good air movement bilaterally, CTAB RRR,No Gallops, Rubs or new Murmurs, No Parasternal Heave +ve B.Sounds, Abd Soft, No tenderness, No organomegaly appriciated, No rebound - guarding or rigidity. No Cyanosis, Clubbing or edema, No new Rash or bruise   Data Reviewed: I have personally reviewed following labs and imaging studies  CBC: Recent  Labs  Lab 03/20/21 0215 03/23/21 0654 03/24/21 0856 03/25/21 0203  WBC 9.1 9.9 9.2 8.9  NEUTROABS  --  5.2 4.8 4.1  HGB 13.6 11.8* 11.8* 11.7*  HCT 40.3 36.1 35.4* 35.4*  MCV 87.8 89.8 89.2 89.6  PLT 377 307 308 310   Basic Metabolic Panel: Recent Labs  Lab 03/22/21 0510 03/22/21 1635 03/23/21 0654 03/23/21 1624 03/24/21 0856 03/25/21 0203  NA  --   --  137  --  135 136  K 4.0 4.3 3.6 4.2 4.1 3.7  CL  --   --  105  --  102 102  CO2  --   --  24  --  26 26  GLUCOSE  --   --  182*  --  237* 152*  BUN  --   --  24*  --  21* 25*  CREATININE  --   --  0.78  --  0.75 0.69  CALCIUM  --   --  9.4  --  9.4 9.7  MG 1.7 1.8 1.7 1.7 1.7 1.7  PHOS 4.1 3.5 3.7 3.8 4.1  --    GFR: Estimated Creatinine Clearance: 76.1 mL/min (by C-G formula based on SCr of 0.69 mg/dL). Liver Function Tests: Recent Labs  Lab 03/23/21 0654 03/24/21 0856 03/25/21 0203  AST 11* 11* 12*  ALT 10 9 10   ALKPHOS 55 60 62  BILITOT 0.5 0.6 0.4  PROT 6.4* 6.2* 6.7  ALBUMIN 2.9* 2.8* 3.0*   No results for input(s):  LIPASE, AMYLASE in the last 168 hours. No results for input(s): AMMONIA in the last 168 hours. Coagulation Profile: No results for input(s): INR, PROTIME in the last 168 hours. Cardiac Enzymes: No results for input(s): CKTOTAL, CKMB, CKMBINDEX, TROPONINI in the last 168 hours. BNP (last 3 results) No results for input(s): PROBNP in the last 8760 hours. HbA1C: No results for input(s): HGBA1C in the last 72 hours.  CBG: Recent Labs  Lab 03/24/21 1613 03/24/21 1920 03/24/21 2304 03/25/21 0305 03/25/21 0739  GLUCAP 227* 222* 256* 134* 180*   Lipid Profile: No results for input(s): CHOL, HDL, LDLCALC, TRIG, CHOLHDL, LDLDIRECT in the last 72 hours. Thyroid Function Tests: No results for input(s): TSH, T4TOTAL, FREET4, T3FREE, THYROIDAB in the last 72 hours. Anemia Panel: No results for input(s): VITAMINB12, FOLATE, FERRITIN, TIBC, IRON, RETICCTPCT in the last 72 hours. Urine analysis:    Component Value Date/Time   COLORURINE YELLOW 03/03/2021 0810   APPEARANCEUR CLEAR 03/03/2021 0810   LABSPEC 1.022 03/03/2021 0810   PHURINE 5.0 03/03/2021 0810   GLUCOSEU >=500 (A) 03/03/2021 0810   HGBUR NEGATIVE 03/03/2021 0810   BILIRUBINUR NEGATIVE 03/03/2021 0810   KETONESUR 80 (A) 03/03/2021 0810   PROTEINUR NEGATIVE 03/03/2021 0810   UROBILINOGEN 0.2 07/20/2014 2022   NITRITE NEGATIVE 03/03/2021 0810   LEUKOCYTESUR SMALL (A) 03/03/2021 0810     No results found for this or any previous visit (from the past 240 hour(s)).    Radiology Studies: No results found.  Scheduled Meds:  Benzocaine   Mouth/Throat BID   enoxaparin (LOVENOX) injection  40 mg Subcutaneous Q24H   feeding supplement (PROSource TF)  45 mL Per Tube TID   insulin aspart  0-20 Units Subcutaneous Q4H   insulin glargine  20 Units Subcutaneous BID   multivitamin  15 mL Per Tube Daily   Continuous Infusions:  feeding supplement (OSMOLITE 1.5 CAL) 50 mL/hr at 03/23/21 2043     LOS: 72 days    Signature  Susa Raring M.D on 03/25/2021 at 10:38 AM   -  To page go to [email or web address]

## 2021-03-26 LAB — CBC WITH DIFFERENTIAL/PLATELET
Abs Immature Granulocytes: 0.02 10*3/uL (ref 0.00–0.07)
Basophils Absolute: 0 10*3/uL (ref 0.0–0.1)
Basophils Relative: 0 %
Eosinophils Absolute: 0.2 10*3/uL (ref 0.0–0.5)
Eosinophils Relative: 2 %
HCT: 34.9 % — ABNORMAL LOW (ref 36.0–46.0)
Hemoglobin: 11.6 g/dL — ABNORMAL LOW (ref 12.0–15.0)
Immature Granulocytes: 0 %
Lymphocytes Relative: 37 %
Lymphs Abs: 3.4 10*3/uL (ref 0.7–4.0)
MCH: 29.9 pg (ref 26.0–34.0)
MCHC: 33.2 g/dL (ref 30.0–36.0)
MCV: 89.9 fL (ref 80.0–100.0)
Monocytes Absolute: 0.8 10*3/uL (ref 0.1–1.0)
Monocytes Relative: 9 %
Neutro Abs: 4.6 10*3/uL (ref 1.7–7.7)
Neutrophils Relative %: 52 %
Platelets: 326 10*3/uL (ref 150–400)
RBC: 3.88 MIL/uL (ref 3.87–5.11)
RDW: 14.2 % (ref 11.5–15.5)
WBC: 9.1 10*3/uL (ref 4.0–10.5)
nRBC: 0 % (ref 0.0–0.2)

## 2021-03-26 LAB — COMPREHENSIVE METABOLIC PANEL
ALT: 10 U/L (ref 0–44)
AST: 12 U/L — ABNORMAL LOW (ref 15–41)
Albumin: 2.8 g/dL — ABNORMAL LOW (ref 3.5–5.0)
Alkaline Phosphatase: 53 U/L (ref 38–126)
Anion gap: 8 (ref 5–15)
BUN: 23 mg/dL — ABNORMAL HIGH (ref 6–20)
CO2: 26 mmol/L (ref 22–32)
Calcium: 9.5 mg/dL (ref 8.9–10.3)
Chloride: 99 mmol/L (ref 98–111)
Creatinine, Ser: 0.78 mg/dL (ref 0.44–1.00)
GFR, Estimated: >60 mL/min (ref >60)
Glucose, Bld: 266 mg/dL — ABNORMAL HIGH (ref 70–99)
Potassium: 3.9 mmol/L (ref 3.5–5.1)
Sodium: 133 mmol/L — ABNORMAL LOW (ref 135–145)
Total Bilirubin: 0.5 mg/dL (ref 0.3–1.2)
Total Protein: 6.2 g/dL — ABNORMAL LOW (ref 6.5–8.1)

## 2021-03-26 LAB — GLUCOSE, CAPILLARY
Glucose-Capillary: 144 mg/dL — ABNORMAL HIGH (ref 70–99)
Glucose-Capillary: 197 mg/dL — ABNORMAL HIGH (ref 70–99)
Glucose-Capillary: 205 mg/dL — ABNORMAL HIGH (ref 70–99)
Glucose-Capillary: 211 mg/dL — ABNORMAL HIGH (ref 70–99)
Glucose-Capillary: 249 mg/dL — ABNORMAL HIGH (ref 70–99)
Glucose-Capillary: 255 mg/dL — ABNORMAL HIGH (ref 70–99)
Glucose-Capillary: 291 mg/dL — ABNORMAL HIGH (ref 70–99)

## 2021-03-26 LAB — BRAIN NATRIURETIC PEPTIDE: B Natriuretic Peptide: 37.9 pg/mL (ref 0.0–100.0)

## 2021-03-26 LAB — MAGNESIUM: Magnesium: 1.7 mg/dL (ref 1.7–2.4)

## 2021-03-26 NOTE — Progress Notes (Signed)
PROGRESS NOTE    Carolyn Norton   ONG:[id number]  DOB: 08/05/1966  DOA: 01/11/2021 PCP: Carolyn Lodge, FNP   Brief Narrative:  Carolyn Norton a 55 year old female with past medical history of diabetes mellitus type 2, bipolar disorder, cocaine abuse, cognitive impairment who lives with her parents and is taken care of by them. She was unable to give a history in the ED.  She presented from home with hyperglycemia (CBG 700s), steady cognitive and function decline at home  with inability to ambulate with multiple falls over the past 3 weeks. She had been confused for 4 months (since she had COVID). She had stopped taking her meds 3 days prior to coming in which appeared to be the reason her parents brought her. She was unable to ambulate in the ED and had hyperglycemia (CBG 729).  She was admitted to [facility].  Her hospital stay has been complicated by progressive cognitive and functional decline.  She has had psychiatry, palliative care, neurology and neurosurgery consults but no cause for her rapid cognitive decline has been found.  At this point, PT has signed off not recommending her for SNF, but for some kind if long term care.  In discussion with patient's father, no plans for long-term feeding tube, but requested to try NG tube.  Nursing team unable to place NG tube due to patient compliance.  [facility] team consulted.  Patient about the same.  No complaints, but then does not respond to further questions  Subjective -  Patient in bed, appears comfortable, denies any headache, no fever, no chest pain or pressure, no shortness of breath , no abdominal pain. No new focal weakness.   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  She was subsequently  seen by neurology along with neurosurgery for suspicion of NPH, multiple large-volume LPs were done and eventually on 03/06/2021 clinically and after an LP neurology finally ruled out NPH.  They recommend outpatient neurology follow-up for further work-up if needed.  Currently mental status has not much changed, neurology now thinks that she has underlying dementia and she will benefit from supportive care and outpatient neurology follow-up.  Unfortunately her cognitive skills are gradually declining.  Poor oral intake - weight on 5/7- 99.8 kg- weight today 67.7 kg, severe protein calorie malnutrition.  Initially placed on Megace but this was discontinued after hyperglycemia, father who is the chief decision-maker was consulted and he requested 7-day trial of NG tube with tube feeds which will be done.  However I do not think this is a long-term strategy as after 7 days of nutrition she will most likely be back to the present baseline.  Detailed discussion with patient's father Mr. Carolyn Norton on 03/25/2021, he saw the patient last few days and agrees that she has made almost no improvement with tube feedings, he is open for full comfort measures if there is no improvement by Friday or Saturday.    Acute respiratory distress on 03/15/2021.  Due to aspiration pneumonitis.  Resolved.    COPD - no acute issues with this   E. coli and Klebsiella UTI- ~ 6/27  - Has been treated   Bipolar disorder - she was on - Lexapro, Depakote and gabapentin a renewed on 03/05/2021 for recurrent sedation pain   Severe  lower extremity weakness (starting at least 1 month prior to admission)  -this is due to severe deconditioning, she had not been ambulating at home for several weeks prior to hospital visit, seen by neurology underwent extensive work-up including MRI's cervical thoracic lumbar spine nonrevealing, continue PT OT and supportive care.   History of polysubstance abuse  - Including cocaine and alcohol, On 2/15 she was  cocaine and benzodiazepine positive.  She has been extensively counseled to quit all.  DM2 uncontrolled - Hemoglobin A1c is 11 on 01/12/21 suggesting poor outpatient control due to hyperglycemia. On Lantus and sliding scale, adjusted on 03/22/2021  CBG (last 3)  Recent Labs    03/26/21 0448 03/26/21 0819 03/26/21 1141  GLUCAP 205* 144* 255*    DVT prophylaxis: enoxaparin (LOVENOX) injection 40 mg Start: 03/24/21 1000 SCD's Start: 02/14/21 0829, Lovenox Code Status: DNR Family Communication: Updated father Carolyn KnucklesClifton [phone] - by phone on 03/22/21, 03/25/21 Level of Care: Level of care: Med-Surg Disposition Plan:  Status is: Inpatient  Remains inpatient appropriate because:Unsafe d/c plan  Dispo:  Patient From:  home with parents  Planned Disposition: Long-term care facility.  Medically stable for discharge: No    Antimicrobials:  Anti-infectives (From admission, onward)    Start     Dose/Rate Route Frequency Ordered Stop   03/06/21 0900  ceFAZolin (ANCEF) IVPB 1 g/50 mL premix        1 g 100 mL/hr over 30 Minutes Intravenous Every 8 hours 03/06/21 0801 03/10/21 0559   03/04/21 1430  cefTRIAXone (ROCEPHIN) 1 g in sodium chloride 0.9 % 100 mL IVPB  Status:  Discontinued        1 g 200 mL/hr over 30 Minutes Intravenous Every 24 hours 03/04/21 1335 03/06/21 0800   02/14/21 0828  vancomycin (VANCOREADY) IVPB 1000 mg/200 mL  Status:  Discontinued        1,000 mg 200 mL/hr over 60 Minutes Intravenous 60 min pre-op 02/14/21 0828 03/02/21 1139   02/13/21 2115  cefTRIAXone (ROCEPHIN) 1 g in sodium chloride 0.9 % 100 mL IVPB  Status:  Discontinued        1 g 200 mL/hr over 30 Minutes Intravenous Every 24 hours 02/13/21 2016 02/16/21 0844        Objective: Vitals:   03/25/21 0514 03/25/21 1320 03/25/21 2007 03/26/21 0823  BP: (!) 144/86 (!) 145/78 (!) 158/93 139/73  Pulse: 90 87 97   Resp: 20 18 20    Temp: 97.9 F (36.6 C) 98 F (36.7 C) 98.9 F (37.2 C) 97.9 F (36.6 C)   TempSrc: Oral Oral  Oral  SpO2: 99% 98% 100% 98%  Weight:      Height:        Intake/Output Summary (Last 24 hours) at 03/26/2021 1212 Last data filed at 03/26/2021 0915 Gross per 24 hour  Intake 1260.34 ml  Output 750 ml  Net 510.34 ml   Filed Weights   01/11/21 1152 03/21/21 1456 03/23/21 0433  Weight: 99.8 kg 67.5 kg 68.1 kg    Examination:  Awake but not completely alert, follows commands inconsistently, NG in place, diffusely weak Legs >> arms [place].AT,[name] Supple Neck,No JVD, No cervical lymphadenopathy appriciated.  Symmetrical Chest wall movement, Good air movement bilaterally, CTAB RRR,No Gallops, Rubs or new Murmurs, No Parasternal Heave +ve B.Sounds, Abd Soft, No tenderness, No organomegaly appriciated, No rebound - guarding or rigidity. No Cyanosis, Clubbing or edema, No new Rash or bruise    Data Reviewed: I have personally reviewed following labs  and imaging studies  CBC: Recent Labs  Lab 03/20/21 0215 03/23/21 0654 03/24/21 0856 03/25/21 0203 03/26/21 0222  WBC 9.1 9.9 9.2 8.9 9.1  NEUTROABS  --  5.2 4.8 4.1 4.6  HGB 13.6 11.8* 11.8* 11.7* 11.6*  HCT 40.3 36.1 35.4* 35.4* 34.9*  MCV 87.8 89.8 89.2 89.6 89.9  PLT 377 307 308 310 326   Basic Metabolic Panel: Recent Labs  Lab 03/22/21 0510 03/22/21 1635 03/23/21 0654 03/23/21 1624 03/24/21 0856 03/25/21 0203 03/26/21 0222  NA  --   --  137  --  135 136 133*  K 4.0 4.3 3.6 4.2 4.1 3.7 3.9  CL  --   --  105  --  102 102 99  CO2  --   --  24  --  26 26 26   GLUCOSE  --   --  182*  --  237* 152* 266*  BUN  --   --  24*  --  21* 25* 23*  CREATININE  --   --  0.78  --  0.75 0.69 0.78  CALCIUM  --   --  9.4  --  9.4 9.7 9.5  MG 1.7 1.8 1.7 1.7 1.7 1.7 1.7  PHOS 4.1 3.5 3.7 3.8 4.1  --   --    GFR: Estimated Creatinine Clearance: 76.1 mL/min (by C-G formula based on SCr of 0.78 mg/dL). Liver Function Tests: Recent Labs  Lab 03/23/21 0654 03/24/21 0856 03/25/21 0203 03/26/21 0222  AST 11*  11* 12* 12*  ALT 10 9 10 10   ALKPHOS 55 60 62 53  BILITOT 0.5 0.6 0.4 0.5  PROT 6.4* 6.2* 6.7 6.2*  ALBUMIN 2.9* 2.8* 3.0* 2.8*   No results for input(s): LIPASE, AMYLASE in the last 168 hours. No results for input(s): AMMONIA in the last 168 hours. Coagulation Profile: No results for input(s): INR, PROTIME in the last 168 hours. Cardiac Enzymes: No results for input(s): CKTOTAL, CKMB, CKMBINDEX, TROPONINI in the last 168 hours. BNP (last 3 results) No results for input(s): PROBNP in the last 8760 hours. HbA1C: No results for input(s): HGBA1C in the last 72 hours.  CBG: Recent Labs  Lab 03/25/21 2008 03/26/21 0000 03/26/21 0448 03/26/21 0819 03/26/21 1141  GLUCAP 82 291* 205* 144* 255*   Lipid Profile: No results for input(s): CHOL, HDL, LDLCALC, TRIG, CHOLHDL, LDLDIRECT in the last 72 hours. Thyroid Function Tests: No results for input(s): TSH, T4TOTAL, FREET4, T3FREE, THYROIDAB in the last 72 hours. Anemia Panel: No results for input(s): VITAMINB12, FOLATE, FERRITIN, TIBC, IRON, RETICCTPCT in the last 72 hours. Urine analysis:    Component Value Date/Time   COLORURINE YELLOW 03/03/2021 0810   APPEARANCEUR CLEAR 03/03/2021 0810   LABSPEC 1.022 03/03/2021 0810   PHURINE 5.0 03/03/2021 0810   GLUCOSEU >=500 (A) 03/03/2021 0810   HGBUR NEGATIVE 03/03/2021 0810   BILIRUBINUR NEGATIVE 03/03/2021 0810   KETONESUR 80 (A) 03/03/2021 0810   PROTEINUR NEGATIVE 03/03/2021 0810   UROBILINOGEN 0.2 07/20/2014 2022   NITRITE NEGATIVE 03/03/2021 0810   LEUKOCYTESUR SMALL (A) 03/03/2021 0810     No results found for this or any previous visit (from the past 240 hour(s)).    Radiology Studies: No results found.  Scheduled Meds:  Benzocaine   Mouth/Throat BID   enoxaparin (LOVENOX) injection  40 mg Subcutaneous Q24H   feeding supplement (PROSource TF)  45 mL Per Tube TID   insulin aspart  0-20 Units Subcutaneous Q4H   insulin glargine  20 Units Subcutaneous  BID    multivitamin  15 mL Per Tube Daily   Continuous Infusions:  feeding supplement (OSMOLITE 1.5 CAL) 50 mL/hr at 03/25/21 1800     LOS: 73 days   Signature  Susa Raring M.D on 03/26/2021 at 12:12 PM   -  To page go to [email or web address]

## 2021-03-27 LAB — GLUCOSE, CAPILLARY
Glucose-Capillary: 101 mg/dL — ABNORMAL HIGH (ref 70–99)
Glucose-Capillary: 174 mg/dL — ABNORMAL HIGH (ref 70–99)
Glucose-Capillary: 190 mg/dL — ABNORMAL HIGH (ref 70–99)
Glucose-Capillary: 241 mg/dL — ABNORMAL HIGH (ref 70–99)
Glucose-Capillary: 275 mg/dL — ABNORMAL HIGH (ref 70–99)
Glucose-Capillary: 292 mg/dL — ABNORMAL HIGH (ref 70–99)

## 2021-03-27 MED ORDER — INSULIN GLARGINE 100 UNIT/ML ~~LOC~~ SOLN
24.0000 [IU] | Freq: Two times a day (BID) | SUBCUTANEOUS | Status: DC
  Administered 2021-03-27 – 2021-03-30 (×7): 24 [IU] via SUBCUTANEOUS
  Filled 2021-03-27 (×8): qty 0.24

## 2021-03-27 NOTE — Progress Notes (Signed)
[facility] PROGRESS NOTE  Triniti Gruetzmacher ZDG:[id number] DOB: [birth year] DOA: 01/11/2021 PCP: [name], FNP            March 27, 2021     Status: Remains inpatient appropriate because:Unsafe d/c plan-due to patient's underlying poor mental status short-term memory deficits she will require 24/7 care after discharge  Dispo:  Patient From:  Home  Planned Disposition: SNF  Medically stable for discharge:  yes  Barriers to DC: Continues to require significant assistance with mobility.  Continues to have significant short-term memory deficits s significant progressive decline in mobility impeding patient's ability to safely return home with elderly parents; please note that the patient's father continues to work outside of the home therefore the burden of care would follow-up on the patient's elderly mother             Difficult to place: Yes  Level of care: Med-Surg  Code Status: DNR Family Communication: Father 7/7 updated on changes in dtrs status DVT prophylaxis: Lovenox COVID vaccination status: Unknown-post COVID infection 09/06/20   HPI: 55 year old female past medical history of diabetes mellitus type 2, bipolar disorder type I, previous COVID infection, polysubstance abuse that includes cocaine.  She presented from home where she lives with her parents.  Her presenting symptoms included generalized weakness, multiple falls and confusion.  Family confirmed that she had been having the symptoms for at least a month with last use of crack cocaine 1 month prior to presentation.  Ever since that time she had not been doing well.  She would also be having issues with hyperglycemia.  In the ER she was diagnosed with severe dehydration, hyperglycemia and metabolic encephalopathy.  Since admission patient has been evaluated by the psychiatric team who has deemed her alert and oriented and having capacity to make decisions.  Patient is agreeable to placement at  a rehab.  Currently she has severe physical deconditioning which was POA and PT is recommending SNF.  Because of her history of polysubstance abuse been difficult to obtain an SNF bed.  Initially her family is agreeable to taking her back in home when she is able to ambulate well enough to get back and forth for meals into the bathroom.  Unfortunately her cognitive and physical status has progressively declined during this admission.  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  Palliative medicine is continuing to follow along.  Patient's previous advanced directives stated she did not want a PEG tube if she were unable to eat.  Palliative medicine has had multiple meetings with the family on 7/15 decision was made to place core track tube for short-term feedings to see if patient demonstrates any improvement in symptoms.  Subjective: Awakens.  Engages briefly in conversation with simple one-word responses.  When asked if she was doing okay she stated "yes".  When I asked her if she could feel me touching her arm again she was stated in the affirmative.  She was unable to localize sensation and was unable to move extremities on command.  Objective: Vitals:   03/27/21 0447 03/27/21 0743  BP: 120/69   Pulse: (!) 105 95  Resp: 18 17  Temp: 99.6 F (37.6 C) 98.9 F (37.2 C)  SpO2: 98% 98%    Intake/Output Summary (Last 24 hours) at 03/27/2021 0754 Last data filed at 03/26/2021 1356 Gross per 24 hour  Intake 180 ml  Output 900 ml  Net -720 ml    Filed Weights   03/21/21 1456 03/23/21 0433 03/27/21 0342  Weight: 67.5 kg 68.1 kg 69.6 kg    Exam:  Constitutional: Appears chronically ill, acute physical distress Respiratory:  2 L O2 with anterior lung sounds clear to auscultation, no increased work of breathing.  O2 saturation the 8% Cardiac: Normotensive, pulse ranging between 95 and 105, no peripheral  edema Abdomen: LBM 7/15, soft nontender nondistended.  Cortrack for tube feedings Neurologic: On gross inspection cranial nerves intact, no spontaneous or purposeful movement of extremities detected although sensation remains intact.  Unable to localize pain.  Currently bedbound and has not been able to participate with therapies. Psychiatric: Awakens, oriented to name only unable to answer orientation questions.  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  Initially felt to be partially due to NPH physiology given after initial large-volume LP procedure she demonstrated some improvement in mobility and cognition.  Repeat large-volume LP without similar results.  MRI of her spine without any abnormalities to explain her progressive lower extremity weakness. 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  Dysphagia/failure to thrive secondary to multifactorial neurological condition Appetite did not improve with Megace and this was discontinued secondary to significant hyperglycemia Family has decided to begin a trial of enteral feedings via core track tube to see if this will help patient improve-. Unfortunately not improved and anticipate will transition to comfort care after meeting with PMT later today.  Acute hypoxemic respiratory failure with underlying COPD Continue 2 L nasal cannula oxygen Change scheduled duo nebs to prn  Diabetes mellitus 2 with hyperglycemia on insulin Hemoglobin A1c 11.0 CBGs > 200 so increase Lantus to 24 units twice daily Continue to follow CBG every 4 hours with resistant  SSI  Elevated blood pressure No prior documented history of hypertension  Continue hydralazine to 25 mg every 6 hours prn  Profound physical deconditioning/critical illness myopathy Likely a combination of persistent altered mental status and inability to participate consistently with PT as well as myopathy of critical illness  History of bipolar 1 disorder/polysubstance abuse/cognitive impairment Continue to hold home escitalopram, Depakote and Neurontin secondary to persistent altered mentation and bipolar symptoms not present  Ventriculomegaly/? NPH NPH ruled out as above  Dyslipidemia Hold statin until oral intake improves  Physical deconditioning/bilateral heel cord shortening Continue bilateral PRAFO boots on every 4 hours and off every 4 hours Given progressive neurological decline unable to participate with therapies    Other problems: GERD Oral PPI on hold while n.p.o.  Hypokalemia/hypomagnesemia Resolved  E. Coli [name] UTI Treated Use purewick only at bedtime    Data Reviewed: Basic Metabolic Panel: Recent Labs  Lab 03/22/21 0510 03/22/21 1635 03/23/21 0654 03/23/21 1624 03/24/21 0856 03/25/21 0203 03/26/21 0222  NA  --   --  137  --  135 136 133*  K 4.0 4.3 3.6 4.2 4.1 3.7 3.9  CL  --   --  105  --  102 102 99  CO2  --   --  24  --  26 26 26   GLUCOSE  --   --  182*  --  237* 152* 266*  BUN  --   --  24*  --  21* 25* 23*  CREATININE  --   --  0.78  --  0.75 0.69 0.78  CALCIUM  --   --  9.4  --  9.4 9.7 9.5  MG 1.7 1.8 1.7 1.7 1.7 1.7 1.7  PHOS 4.1 3.5 3.7 3.8 4.1  --   --    Liver Function Tests: Recent Labs  Lab 03/23/21 0654 03/24/21 0856 03/25/21 0203 03/26/21 0222  AST 11* 11* 12* 12*  ALT 10 9 10 10   ALKPHOS 55 60 62 53  BILITOT 0.5 0.6 0.4 0.5  PROT 6.4* 6.2* 6.7 6.2*  ALBUMIN 2.9* 2.8* 3.0* 2.8*    No results for input(s): LIPASE, AMYLASE in the last 168 hours. No results for input(s): AMMONIA in the last 168  hours. CBC: Recent Labs  Lab 03/23/21 0654 03/24/21 0856 03/25/21 0203 03/26/21 0222  WBC 9.9 9.2 8.9 9.1  NEUTROABS 5.2 4.8 4.1 4.6  HGB 11.8* 11.8* 11.7* 11.6*  HCT 36.1 35.4* 35.4* 34.9*  MCV 89.8 89.2 89.6 89.9  PLT 307 308 310 326   Cardiac Enzymes: No results for input(s): CKTOTAL, CKMB, CKMBINDEX, TROPONINI in the last 168 hours. BNP (last 3 results) Recent Labs    03/24/21 0856 03/25/21 0203 03/26/21 0222  BNP 29.2 33.9 37.9    ProBNP (last 3 results) No results for input(s): PROBNP in the last 8760 hours.  CBG: Recent Labs  Lab 03/26/21 1625 03/26/21 1922 03/26/21 2327 03/27/21 0331 03/27/21 0751  GLUCAP 197* 211* 249* 174* 241*    No results found for this or any previous visit (from the past 240 hour(s)).     Studies: No results found.  Scheduled Meds:  Benzocaine   Mouth/Throat BID   enoxaparin (LOVENOX) injection  40 mg Subcutaneous Q24H   feeding supplement (PROSource TF)  45 mL Per Tube TID   insulin aspart  0-20 Units Subcutaneous Q4H   insulin glargine  20 Units Subcutaneous BID   multivitamin  15 mL Per Tube Daily   Continuous Infusions:  feeding supplement (OSMOLITE 1.5 CAL) 50 mL/hr at 03/25/21 1800      Principal Problem:   Bipolar 1 disorder, depressed, full remission (HCC) Active Problems:   Bipolar 1 disorder (HCC)   Hypertension   Generalized weakness   Controlled type 2 diabetes mellitus with hyperglycemia, with long-term current use of insulin (HCC)   Hyperlipidemia   NPH (normal pressure hydrocephalus) (HCC)   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   Protein-calorie malnutrition, severe   Consultants: Psychiatry Neurology Neurosurgery  Procedures: None  Antibiotics: Anti-infectives (From admission, onward)    Start     Dose/Rate Route Frequency Ordered Stop   03/06/21 0900  ceFAZolin (ANCEF) IVPB 1 g/50 mL premix        1 g 100 mL/hr over 30 Minutes Intravenous Every 8 hours 03/06/21 0801 03/10/21 0559    03/04/21 1430  cefTRIAXone (ROCEPHIN) 1 g in sodium chloride 0.9 % 100 mL IVPB  Status:  Discontinued        1 g 200 mL/hr over 30 Minutes Intravenous Every 24 hours 03/04/21 1335 03/06/21 0800   02/14/21 0828  vancomycin (VANCOREADY) IVPB 1000 mg/200 mL  Status:  Discontinued        1,000 mg 200 mL/hr over 60 Minutes Intravenous 60 min pre-op 02/14/21 0828 03/02/21 1139   02/13/21 2115  cefTRIAXone (ROCEPHIN) 1 g in sodium chloride 0.9 % 100 mL IVPB  Status:  Discontinued        1 g 200 mL/hr over 30 Minutes  Intravenous Every 24 hours 02/13/21 2016 02/16/21 0844        Time spent: 25 minutes    [name] ANP  [facility] 7 am - 330 pm/M-F for direct patient care and secure chat Please refer to Amion for contact info 74  days
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
## 2021-03-27 NOTE — Plan of Care (Signed)
  Problem: Nutrition: Goal: Adequate nutrition will be maintained Outcome: Progressing   Problem: Elimination: Goal: Will not experience complications related to bowel motility Outcome: Progressing   Problem: Safety: Goal: Ability to remain free from injury will improve Outcome: Progressing   

## 2021-03-28 LAB — GLUCOSE, CAPILLARY
Glucose-Capillary: 142 mg/dL — ABNORMAL HIGH (ref 70–99)
Glucose-Capillary: 151 mg/dL — ABNORMAL HIGH (ref 70–99)
Glucose-Capillary: 194 mg/dL — ABNORMAL HIGH (ref 70–99)
Glucose-Capillary: 215 mg/dL — ABNORMAL HIGH (ref 70–99)
Glucose-Capillary: 280 mg/dL — ABNORMAL HIGH (ref 70–99)
Glucose-Capillary: 325 mg/dL — ABNORMAL HIGH (ref 70–99)

## 2021-03-28 NOTE — Progress Notes (Signed)
CSW searched patient in [place] [employer] to determine if her Medicaid has been approved and it has not. The plan is for an additional GOC meeting on Saturday 03/29/21 to discuss next steps.  Edwin Dada, MSW, LCSW Transitions of Care  Clinical Social Worker II [phone]

## 2021-03-28 NOTE — Progress Notes (Addendum)
[facility] PROGRESS NOTE  Lucilla LameMichelle Lee Sainsbury ZOX:[id number] DOB: 1965-12-05 DOA: 01/11/2021 PCP: Dartha LodgeSteele, Anthony, FNP            March 27, 2021     Status: Remains inpatient appropriate because:Unsafe d/c plan-due to patient's underlying poor mental status short-term memory deficits she will require 24/7 care after discharge  Dispo:  Patient From:  Home  Planned Disposition: SNF  Medically stable for discharge:  yes  Barriers to DC: Continues to require significant assistance with mobility.  Continues to have significant short-term memory deficits s significant progressive decline in mobility impeding patient's ability to safely return home with elderly parents; please note that the patient's father continues to work outside of the home therefore the burden of care would follow-up on the patient's elderly mother             Difficult to place: Yes  Level of care: Med-Surg  Code Status: DNR Family Communication: Father 7/22 updated regarding likelihood Long COVID neurological syndrome contributing to rapid decline DVT prophylaxis: Lovenox COVID vaccination status: Unknown-post COVID infection 09/06/20   HPI: 55 year old female past medical history of diabetes mellitus type 2, bipolar disorder type I, previous COVID infection, polysubstance abuse that includes cocaine.  She presented from home where she lives with her parents.  Her presenting symptoms included generalized weakness, multiple falls and confusion.  Family confirmed that she had been having the symptoms for at least a month with last use of crack cocaine 1 month prior to presentation.  Ever since that time she had not been doing well.  She would also be having issues with hyperglycemia.  In the ER she was diagnosed with severe dehydration, hyperglycemia and metabolic encephalopathy.  Since admission patient has been evaluated by the psychiatric team who has deemed her alert and oriented and having  capacity to make decisions.  Patient is agreeable to placement at a rehab.  Currently she has severe physical deconditioning which was POA and PT is recommending SNF.  Because of her history of polysubstance abuse been difficult to obtain an SNF bed.  Initially her family is agreeable to taking her back in home when she is able to ambulate well enough to get back and forth for meals into the bathroom.  Unfortunately her cognitive and physical status has progressively declined during this admission.  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  Palliative medicine is continuing to follow along.  Patient's previous advanced directives stated she did not want a PEG tube if she were unable to eat.  Palliative medicine has had multiple meetings with the family on 7/15 decision was made to place core track tube for short-term feedings to see if patient demonstrates any improvement in symptoms.  Subjective: Awake.  Smiled briefly.  Continues with simple one-word responses only.  Somewhat more withdrawn today and/or easily distracted.  Objective: Vitals:   03/27/21 1907 03/28/21 0520  BP: 117/86 (!) 149/82  Pulse: (!) 102 (!) 104  Resp: 19 20  Temp: 98.5 F (36.9 C) 98.6 F (37 C)  SpO2: 99% 98%    Intake/Output Summary (Last 24 hours) at 03/28/2021 0742 Last data filed at 03/28/2021 0546 Gross per 24 hour  Intake 150 ml  Output 600 ml  Net -450 ml    Filed Weights   03/23/21 0433 03/27/21 0342 03/28/21 0355  Weight: 68.1 kg 69.6 kg 70.7 kg    Exam:  Constitutional: Appears chronically ill, in no acute distress, pale Respiratory:  2 L O2 , lungs are clear anteriorly, no increased work of breathing Cardiac: S1-S2, only hypertensive, no tachycardia, no peripheral edema Abdomen: LBM 7/20, soft nontender.  Core track tube in place with continuous tube feedings.  Oral intake remains poor. Neurologic: Cranial  nerves appear grossly intact, sensation intact all extremities.  No spontaneous movement of extremities and has not been able to follow commands. Psychiatric: Awake.  Very flat affect although will smile intermittently.  Oriented to name only.  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  Initially felt to be partially due to NPH physiology given after initial large-volume LP procedure she demonstrated some improvement in mobility and cognition.  Repeat large-volume LP without similar results.  MRI of her spine without any abnormalities to explain her progressive lower extremity weakness. 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  Dysphagia/failure to thrive secondary to multifactorial neurological condition Appetite did not improve with Megace and this was discontinued secondary to significant hyperglycemia Family has decided to begin a trial of enteral feedings via core track tube to see if this will help patient improve  Unfortunately not improved  MD spoke with patient's father yesterday as planned.  He has requested to delay decision regarding transition to comfort care/removal of cortrack tube until Saturday  Acute hypoxemic respiratory failure with underlying COPD Continue 2 L nasal cannula oxygen Change scheduled duo nebs to prn  Diabetes mellitus 2 with hyperglycemia on insulin Hemoglobin A1c 11.0 Continue Lantus to 24 units twice daily Continue to follow CBG every 4 hours with resistant SSI  Elevated blood pressure No prior documented history of hypertension  Continue hydralazine to 25  mg every 6 hours prn  Profound physical deconditioning/critical illness myopathy Likely a combination of persistent altered mental status and inability to participate consistently with PT as well as myopathy of critical illness  History of bipolar 1 disorder/polysubstance abuse/cognitive impairment Continue to hold home escitalopram, Depakote and Neurontin secondary to persistent altered mentation and bipolar symptoms not present  Ventriculomegaly/? NPH NPH ruled out as above  Dyslipidemia Hold statin until oral intake improves  Physical deconditioning/bilateral heel cord shortening Continue bilateral PRAFO boots on every 4 hours and off every 4 hours Given progressive neurological decline unable to participate with therapies    Other problems: GERD Oral PPI on hold while n.p.o.  Hypokalemia/hypomagnesemia Resolved  E. Coli [name] UTI Treated Use purewick only at bedtime    Data Reviewed: Basic Metabolic Panel: Recent Labs  Lab 03/22/21 0510 03/22/21 1635 03/23/21 0654 03/23/21 1624 03/24/21 0856 03/25/21 0203 03/26/21 0222  NA  --   --  137  --  135 136 133*  K 4.0 4.3 3.6 4.2 4.1 3.7 3.9  CL  --   --  105  --  102 102 99  CO2  --   --  24  --  26 26 26   GLUCOSE  --   --  182*  --  237* 152* 266*  BUN  --   --  24*  --  21* 25* 23*  CREATININE  --   --  0.78  --  0.75 0.69 0.78  CALCIUM  --   --  9.4  --  9.4 9.7 9.5  MG 1.7 1.8 1.7 1.7 1.7 1.7 1.7  PHOS 4.1 3.5 3.7 3.8 4.1  --   --    Liver Function Tests: Recent Labs  Lab 03/23/21 0654 03/24/21 0856 03/25/21 0203 03/26/21 0222  AST 11* 11* 12* 12*  ALT 10 9 10 10   ALKPHOS 55 60 62 53  BILITOT 0.5 0.6 0.4 0.5  PROT 6.4* 6.2* 6.7 6.2*  ALBUMIN 2.9* 2.8* 3.0* 2.8*    No results for input(s): LIPASE, AMYLASE in the last 168 hours. No results for input(s): AMMONIA in the last 168 hours. CBC: Recent Labs  Lab 03/23/21 0654 03/24/21 0856 03/25/21 0203 03/26/21 0222  WBC 9.9 9.2 8.9 9.1   NEUTROABS 5.2 4.8 4.1 4.6  HGB 11.8* 11.8* 11.7* 11.6*  HCT 36.1 35.4* 35.4* 34.9*  MCV 89.8 89.2 89.6 89.9  PLT 307 308 310 326   Cardiac Enzymes: No results for input(s): CKTOTAL, CKMB, CKMBINDEX, TROPONINI in the last 168 hours. BNP (last 3 results) Recent Labs    03/24/21 0856 03/25/21 0203 03/26/21 0222  BNP 29.2 33.9 37.9    ProBNP (last 3 results) No results for input(s): PROBNP in the last 8760 hours.  CBG: Recent Labs  Lab 03/27/21 1608 03/27/21 1904 03/27/21 2345 03/28/21 0338 03/28/21 0737  GLUCAP 275* 101* 190* 215* 142*    No results found for this or any previous visit (from the past 240 hour(s)).     Studies: No results found.  Scheduled Meds:  Benzocaine   Mouth/Throat BID   enoxaparin (LOVENOX) injection  40 mg Subcutaneous Q24H   feeding supplement (PROSource TF)  45 mL Per Tube TID   insulin aspart  0-20 Units Subcutaneous Q4H   insulin glargine  24 Units Subcutaneous BID   multivitamin  15 mL Per Tube Daily   Continuous Infusions:  feeding supplement (OSMOLITE 1.5 CAL) 1,000 mL (03/27/21 1647)      Principal Problem:   Bipolar 1 disorder, depressed, full remission (HCC) Active Problems:   Bipolar 1 disorder (HCC)   Hypertension   Generalized weakness   Controlled type 2 diabetes mellitus with hyperglycemia, with long-term current use of insulin (HCC)   Hyperlipidemia   NPH (normal pressure hydrocephalus) (HCC)   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   Protein-calorie malnutrition, severe   Consultants: Psychiatry Neurology Neurosurgery  Procedures: None  Antibiotics: Anti-infectives (From admission, onward)    Start     Dose/Rate Route Frequency Ordered Stop   03/06/21 0900  ceFAZolin (ANCEF) IVPB 1 g/50 mL premix        1 g 100 mL/hr over 30 Minutes Intravenous Every 8 hours 03/06/21 0801 03/10/21 0559   03/04/21 1430  cefTRIAXone (ROCEPHIN) 1 g in sodium chloride 0.9 % 100 mL IVPB  Status:  Discontinued        1 g 200  mL/hr over 30 Minutes Intravenous Every 24 hours 03/04/21 1335 03/06/21 0800   02/14/21 0828  vancomycin (VANCOREADY) IVPB 1000 mg/200 mL  Status:  Discontinued        1,000 mg 200 mL/hr over 60 Minutes Intravenous 60 min pre-op 02/14/21 0828 03/02/21 1139   02/13/21 2115  cefTRIAXone (ROCEPHIN) 1 g in sodium chloride 0.9 % 100 mL IVPB  Status:  Discontinued        1 g 200 mL/hr over 30 Minutes Intravenous Every 24 hours 02/13/21 2016 02/16/21 0844        Time spent: 15 minutes    04/18/21 ANP  [facility] 7 am - 330 pm/M-F for direct patient care and secure chat Please refer to Amion for contact info 75  days

## 2021-03-28 NOTE — Progress Notes (Signed)
Nutrition Follow-up  DOCUMENTATION CODES:   Severe malnutrition in context of chronic illness  INTERVENTION:  Monitor for results of ongoing GOC discussions. If decision is made to shift to comfort-focused care, Cortrak should be removed and food should be a source of comfort. However, if plan is to continue aggressive measures, Cortrak/TF should be continued until po intake becomes adequate  Continue via Cortrak: -Osmolite 1.5 @ 50 ml/hr (1200 ml/day) -66ml ProSource TF TID   Provides 1920 kcal, 108 grams of protein, and 914 ml of H2O (meets 100% of kcal needs and 100% of protein needs).    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  ongoing  GOAL:   Patient will meet greater than or equal to 90% of their needs  Addressing with TF  MONITOR:   PO intake, Supplement acceptance, Labs, Weight trends, TF tolerance  REASON FOR ASSESSMENT:   Consult Calorie Count, Poor PO  ASSESSMENT:   55 year old female past medical history of diabetes mellitus type 2, bipolar disorder type I, previous COVID infection, polysubstance abuse that includes cocaine.  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 Pt has been at [facility] for the past fifty-three days is not eating or drinking sufficiently. Palliative care has been asked to get re-involved to further discuss goals of care in the setting of progressive failure to thrive and generalized weakness.  7/04 - megace initiated 7/09 - megace dose increased to BID 7/15 - cortrak placed (tip gastric per xray)  Palliative Medicine Team and CM/SW are planning to follow-up with the patient's father tomorrow to further discuss GOC now that pt has undergone 1  week trial of TF. Per NP, pt has not shown improvement and it is anticipated pt will transition to comfort. Per RN, plan until that follow-up GOC discussion is to continue with current TF regimen via Cortrak which pt is tolerating well. Pt not taking in enough PO consistently to reduce to nocturnal TF rate at this time.   PO Intake: 15-75% x 5 recorded meals since last RD assessment (32% average meal intake)  UOP: x24 hours  Medications: Scheduled Meds:  Benzocaine   Mouth/Throat BID   enoxaparin (LOVENOX) injection  40 mg Subcutaneous Q24H   feeding supplement (PROSource TF)  45 mL Per Tube TID   insulin aspart  0-20 Units Subcutaneous Q4H   insulin glargine  24 Units Subcutaneous BID   multivitamin  15 mL Per Tube Daily  Continuous Infusions:  feeding supplement (OSMOLITE 1.5 CAL) 1,000 mL (03/27/21 1647)   Labs: Recent Labs  Lab 03/23/21 0654 03/23/21 1624 03/24/21 0856 03/25/21 0203 03/26/21 0222  NA 137  --  135 136 133*  K 3.6 4.2 4.1 3.7 3.9  CL 105  --  102 102 99  CO2 24  --  26 26 26   BUN 24*  --  21* 25* 23*  CREATININE 0.78  --  0.75 0.69 0.78  CALCIUM 9.4  --  9.4 9.7 9.5  MG 1.7 1.7 1.7 1.7 1.7  PHOS 3.7 3.8 4.1  --   --   GLUCOSE 182*  --  237* 152* 266*  CBGs   Diet Order:   Diet Order             DIET DYS  2 Room service appropriate? No; Fluid consistency: Thin  Diet effective now                   EDUCATION NEEDS:   No education needs have been identified at this time  Skin:  Skin Assessment: Reviewed RN Assessment  Last BM:  7/20 type 6  Height:   Ht Readings from Last 1 Encounters:  03/15/21 5\' 6"  (1.676 m)    Weight:   Wt Readings from Last 1 Encounters:  03/28/21 70.7 kg    Ideal Body Weight:  59.1 kg  BMI:  Body mass index is 25.16 kg/m.  Estimated Nutritional Needs:   Kcal:  1850-2050  Protein:  90-110 grams  Fluid:  >/= 1.8 L    03/30/21, MS, RD, LDN (she/her/hers) RD pager number and  weekend/on-call pager number located in Amion.

## 2021-03-28 NOTE — TOC Progression Note (Signed)
Transition of Care [facility]) - Progression Note    Patient Details  Name: Carolyn Norton MRN: [id number] Date of Birth: 17-Jul-1966  Transition of [facility]) CM/SW Contact  Janae Bridgeman, RN Phone Number: 03/28/2021, 7:48 AM  Clinical Narrative:    Palliative care I planning to follow up with the patient's father on Saturday, 03/29/2021 for further discussion regarding palliative care measures for the patient after the Cortrak feeding trials over the past week.  CM and MSW with DTP Team will continue to follow the patient for transitions of care needs.   Expected Discharge Plan: Hospice Medical Facility Barriers to Discharge: Inadequate or no insurance, Family Issues  Expected Discharge Plan and Services Expected Discharge Plan: Hospice Medical Facility In-house Referral: Clinical Social Work Discharge Planning Services: CM Consult Post Acute Care Choice: Skilled Nursing Facility Living arrangements for the past 2 months: Single Family Home Expected Discharge Date: 01/16/21               DME Arranged: [name] rolling DME Agency: [employer] Date DME Agency Contacted: 01/20/21 Time DME Agency Contacted: 1435 Representative spoke with at DME Agency: [name]             Social Determinants of Health (SDOH) Interventions    Readmission Risk Interventions Readmission Risk Prevention Plan 01/14/2021  Transportation Screening Complete  PCP or Specialist Appt within 3-5 Days (No Data)  Social Work Consult for Recovery Care Planning/Counseling Complete  Palliative Care Screening Not Applicable  Medication Review Oceanographer) Referral to Pharmacy  Some recent data might be hidden

## 2021-03-28 NOTE — Plan of Care (Signed)

## 2021-03-28 NOTE — Progress Notes (Signed)
   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 Still unsure if NPH primary issues or other underlying problem due to h/o drug abuse/bipolar. I plan to update her family later today. RN said they were here yesterday- if this is irreversible she shouldn't get a PEG and should be comfort in my opinion"   HPI:  Per intake H&P --> 55 year old female past medical history of diabetes mellitus type 2, bipolar disorder type I, previous COVID infection, polysubstance abuse that includes cocaine.  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   Carolyn Norton has been at [facility] for the past fifty-three days is not eating or drinking sufficiently.  Palliative care has been asked to get re-involved to further discuss goals of care in the setting of progressive failure to thrive and generalized weakness.  Today's Discussion (03/28/2021): Chart reviewed.    I met with Carolyn Norton this afternoon. She appears similar to prior days. She is pleasant and oriented to herself. She does not have any concerns.  I spoke to patients father Carolyn Norton this afternoon, we reviewed that he had just spoke to Carolyn Norton and understands the the medical team has offered all of the support they can but Ursula is not likely to improve beyond this point. We have agreed to meet tomorrow at 1PM for further conversations and to elaborate on comfort care.  Questions and concerns addressed   Objective Assessment: Vital Signs Vitals:   03/27/21 1907 03/28/21 0520  BP: 117/86 (!) 149/82  Pulse: (!) 102 (!) 104  Resp: 19 20  Temp: 98.5 F (36.9 C) 98.6 F (37 C)  SpO2: 99% 98%     Intake/Output Summary (Last 24 hours) at 03/28/2021 1326 Last data filed at 03/28/2021 1213 Gross per 24 hour  Intake 594 ml  Output 1200 ml  Net -606 ml    Last Weight  Most recent update: 03/28/2021  3:55 AM    Weight  70.7 kg (155 lb 13.8 oz)            Gen:  Caucasian F in NAD HEENT: Dry  mucous membranes CV: Regular rate and rhythm PULM: On RA ABD: soft/nontender EXT: No edema Neuro: Responds to name  SUMMARY OF RECOMMENDATIONS   DNAR/DNI  Cortrack placed on 7/15 trial for 7 days  Plan for meeting tomorrow at [place] with patients father, Carolyn Norton and mother, Carolyn Norton for additional decisions. I provided information on comfort focused care and hospice care and strongly encouraged consideration of this for [facility] in the oncoming days.  Ongoing PMT support  Time Spent : 25 Greater than 50% of the time was spent in counseling and coordination of care ______________________________________________________________________________________ [place] Team Team Cell Phone: [phone] Please utilize secure chat with additional questions, if there is no response within 30 minutes please call the above phone number  Palliative Medicine Team providers are available by phone from 7am to 7pm daily and can be reached through the team cell phone.  Should this patient require assistance outside of these hours, please call the patient's attending physician.

## 2021-03-29 DIAGNOSIS — E43 Unspecified severe protein-calorie malnutrition: Secondary | ICD-10-CM

## 2021-03-29 DIAGNOSIS — K0889 Other specified disorders of teeth and supporting structures: Secondary | ICD-10-CM

## 2021-03-29 LAB — GLUCOSE, CAPILLARY
Glucose-Capillary: 152 mg/dL — ABNORMAL HIGH (ref 70–99)
Glucose-Capillary: 161 mg/dL — ABNORMAL HIGH (ref 70–99)
Glucose-Capillary: 208 mg/dL — ABNORMAL HIGH (ref 70–99)
Glucose-Capillary: 247 mg/dL — ABNORMAL HIGH (ref 70–99)
Glucose-Capillary: 374 mg/dL — ABNORMAL HIGH (ref 70–99)
Glucose-Capillary: 77 mg/dL (ref 70–99)

## 2021-03-29 NOTE — Progress Notes (Signed)
PROGRESS NOTE    Carolyn Norton   ZOX:[id number]  DOB: Jun 22, 1966  DOA: 01/11/2021 PCP: Dartha LodgeSteele, Anthony, FNP   Brief Narrative:  Carolyn PerchesMichelle Lee Stanleyis a 55 year old female with past medical history of diabetes mellitus type 2, bipolar disorder, cocaine abuse, cognitive impairment who lives with her parents and is taken care of by them. She was unable to give a history in the ED.  She presented from home with hyperglycemia (CBG 700s), steady cognitive and function decline at home  with inability to ambulate with multiple falls over the past 3 weeks. She had been confused for 4 months (since she had COVID). She had stopped taking her meds 3 days prior to coming in which appeared to be the reason her parents brought her. She was unable to ambulate in the ED and had hyperglycemia (CBG 729).  She was admitted to [facility].  Her hospital stay has been complicated by progressive cognitive and functional decline.  She has had psychiatry, palliative care, neurology and neurosurgery consults but no cause for her rapid cognitive decline has been found.  At this point, PT has signed off not recommending her for SNF, but for some kind if long term care.  In discussion with patient's father, no plans for long-term feeding tube, but requested to try NG tube.  Nursing team unable to place NG tube due to patient compliance.  [facility] team consulted.  Patient about the same.  No complaints, but then does not respond to further questions  Subjective -  Patient in bed, appears comfortable, denies any headache, no fever, no chest pain or pressure, no shortness of breath , no abdominal pain. No new focal weakness.    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  She was subsequently  seen by neurology along with neurosurgery for suspicion of NPH, multiple large-volume LPs were done and eventually on 03/06/2021 clinically and after an LP neurology finally ruled out NPH.  They recommend outpatient neurology follow-up for further work-up if needed.  Currently mental status has not much changed, neurology now thinks that she has underlying dementia and she will benefit from supportive care and outpatient neurology follow-up.  Unfortunately her cognitive and functional skills continue to gradually decline, poor long-term prognosis, family thinking about transitioning to comfort care after family meeting on 03/29/2021.   Poor oral intake - weight on 5/7- 99.8 kg- weight today 67.7 kg, severe protein calorie malnutrition.  Initially placed on Megace but this was discontinued after hyperglycemia, father who is the chief decision-maker was consulted and he requested 7-day trial of NG tube with tube feeds which will be done.  However I do not think this is a long-term strategy as after 7 days of nutrition she will most likely be back to the present baseline.  Detailed discussion with patient's father Mr. Ephriam KnucklesClifton on 03/25/2021, he saw the patient last few days and agrees that she has made almost no improvement with tube feedings, he is open for full comfort measures if there is no improvement in the next 1 to 2 days.    Acute respiratory distress on 03/15/2021.  Due to aspiration pneumonitis.  Resolved.    COPD - no acute issues with this   E. coli and Klebsiella UTI- ~ 6/27  - Has been treated  Bipolar disorder - she was on - Lexapro, Depakote and gabapentin a renewed on 03/05/2021 for recurrent sedation pain   Severe lower extremity weakness (starting at least 1 month prior to admission)  -this is due to severe deconditioning, she had not been ambulating at home for several weeks prior to hospital visit, seen by neurology underwent extensive work-up including MRI's cervical thoracic lumbar spine  nonrevealing, continue PT OT and supportive care.   History of polysubstance abuse  - Including cocaine and alcohol, On 2/15 she was cocaine and benzodiazepine positive.  She has been extensively counseled to quit all.  DM2 uncontrolled - Hemoglobin A1c is 11 on 01/12/21 suggesting poor outpatient control due to hyperglycemia. On Lantus and sliding scale, adjusted on 03/22/2021  CBG (last 3)  Recent Labs    03/28/21 2351 03/29/21 0329 03/29/21 0717  GLUCAP 151* 208* 152*     DVT prophylaxis: enoxaparin (LOVENOX) injection 40 mg Start: 03/24/21 1000 SCD's Start: 02/14/21 0829   Code Status: DNR Family Communication: Updated father Ephriam Knuckles [phone] - by phone on 03/22/21, 03/25/21, called 03/29/2021 at 9:35 AM.  Multiple rings no response  Level of Care: Level of care: Med-Surg Disposition Plan:   Status is: Inpatient.  Further disposition to be decided on 03/29/2021 after family meeting  Remains inpatient appropriate because:Unsafe d/c plan  Dispo:  Patient From:  home with parents  Planned Disposition: Long-term care facility.  Medically stable for discharge: No    Antimicrobials:  Anti-infectives (From admission, onward)    Start     Dose/Rate Route Frequency Ordered Stop   03/06/21 0900  ceFAZolin (ANCEF) IVPB 1 g/50 mL premix        1 g 100 mL/hr over 30 Minutes Intravenous Every 8 hours 03/06/21 0801 03/10/21 0559   03/04/21 1430  cefTRIAXone (ROCEPHIN) 1 g in sodium chloride 0.9 % 100 mL IVPB  Status:  Discontinued        1 g 200 mL/hr over 30 Minutes Intravenous Every 24 hours 03/04/21 1335 03/06/21 0800   02/14/21 0828  vancomycin (VANCOREADY) IVPB 1000 mg/200 mL  Status:  Discontinued        1,000 mg 200 mL/hr over 60 Minutes Intravenous 60 min pre-op 02/14/21 0828 03/02/21 1139   02/13/21 2115  cefTRIAXone (ROCEPHIN) 1 g in sodium chloride 0.9 % 100 mL IVPB  Status:  Discontinued        1 g 200 mL/hr over 30 Minutes Intravenous Every 24 hours 02/13/21 2016  02/16/21 0844        Objective: Vitals:   03/27/21 1907 03/28/21 0355 03/28/21 0520 03/28/21 1412  BP: 117/86  (!) 149/82 125/73  Pulse: (!) 102  (!) 104 (!) 105  Resp: 19  20   Temp: 98.5 F (36.9 C)  98.6 F (37 C) 98.2 F (36.8 C)  TempSrc:   Axillary   SpO2: 99%  98% 100%  Weight:  70.7 kg    Height:        Intake/Output Summary (Last 24 hours) at 03/29/2021 0935 Last data filed at 03/28/2021 1213 Gross per 24 hour  Intake 594 ml  Output 600 ml  Net -6 ml   Filed Weights   03/23/21 0433 03/27/21 0342 03/28/21 0355  Weight: 68.1 kg 69.6 kg 70.7 kg    Examination:  Awake but not completely alert, follows commands inconsistently, NG in place, diffusely weak Legs >> arms [place].AT,[name] Supple Neck,No JVD, No cervical lymphadenopathy appriciated.  Symmetrical Chest wall movement, Good air movement bilaterally,  CTAB RRR,No Gallops, Rubs or new Murmurs, No Parasternal Heave +ve B.Sounds, Abd Soft, No tenderness, No organomegaly appriciated, No rebound - guarding or rigidity. No Cyanosis, Clubbing or edema    Data Reviewed: I have personally reviewed following labs and imaging studies  CBC: Recent Labs  Lab 03/23/21 0654 03/24/21 0856 03/25/21 0203 03/26/21 0222  WBC 9.9 9.2 8.9 9.1  NEUTROABS 5.2 4.8 4.1 4.6  HGB 11.8* 11.8* 11.7* 11.6*  HCT 36.1 35.4* 35.4* 34.9*  MCV 89.8 89.2 89.6 89.9  PLT 307 308 310 326   Basic Metabolic Panel: Recent Labs  Lab 03/22/21 1635 03/23/21 0654 03/23/21 1624 03/24/21 0856 03/25/21 0203 03/26/21 0222  NA  --  137  --  135 136 133*  K 4.3 3.6 4.2 4.1 3.7 3.9  CL  --  105  --  102 102 99  CO2  --  24  --  26 26 26   GLUCOSE  --  182*  --  237* 152* 266*  BUN  --  24*  --  21* 25* 23*  CREATININE  --  0.78  --  0.75 0.69 0.78  CALCIUM  --  9.4  --  9.4 9.7 9.5  MG 1.8 1.7 1.7 1.7 1.7 1.7  PHOS 3.5 3.7 3.8 4.1  --   --    GFR: Estimated Creatinine Clearance: 76.1 mL/min (by C-G formula based on SCr of 0.78  mg/dL). Liver Function Tests: Recent Labs  Lab 03/23/21 0654 03/24/21 0856 03/25/21 0203 03/26/21 0222  AST 11* 11* 12* 12*  ALT 10 9 10 10   ALKPHOS 55 60 62 53  BILITOT 0.5 0.6 0.4 0.5  PROT 6.4* 6.2* 6.7 6.2*  ALBUMIN 2.9* 2.8* 3.0* 2.8*   No results for input(s): LIPASE, AMYLASE in the last 168 hours. No results for input(s): AMMONIA in the last 168 hours. Coagulation Profile: No results for input(s): INR, PROTIME in the last 168 hours. Cardiac Enzymes: No results for input(s): CKTOTAL, CKMB, CKMBINDEX, TROPONINI in the last 168 hours. BNP (last 3 results) No results for input(s): PROBNP in the last 8760 hours. HbA1C: No results for input(s): HGBA1C in the last 72 hours.  CBG: Recent Labs  Lab 03/28/21 1552 03/28/21 1959 03/28/21 2351 03/29/21 0329 03/29/21 0717  GLUCAP 194* 280* 151* 208* 152*   Lipid Profile: No results for input(s): CHOL, HDL, LDLCALC, TRIG, CHOLHDL, LDLDIRECT in the last 72 hours. Thyroid Function Tests: No results for input(s): TSH, T4TOTAL, FREET4, T3FREE, THYROIDAB in the last 72 hours. Anemia Panel: No results for input(s): VITAMINB12, FOLATE, FERRITIN, TIBC, IRON, RETICCTPCT in the last 72 hours. Urine analysis:    Component Value Date/Time   COLORURINE YELLOW 03/03/2021 0810   APPEARANCEUR CLEAR 03/03/2021 0810   LABSPEC 1.022 03/03/2021 0810   PHURINE 5.0 03/03/2021 0810   GLUCOSEU >=500 (A) 03/03/2021 0810   HGBUR NEGATIVE 03/03/2021 0810   BILIRUBINUR NEGATIVE 03/03/2021 0810   KETONESUR 80 (A) 03/03/2021 0810   PROTEINUR NEGATIVE 03/03/2021 0810   UROBILINOGEN 0.2 07/20/2014 2022   NITRITE NEGATIVE 03/03/2021 0810   LEUKOCYTESUR SMALL (A) 03/03/2021 0810     No results found for this or any previous visit (from the past 240 hour(s)).    Radiology Studies: No results found.  Scheduled Meds:  Benzocaine   Mouth/Throat BID   enoxaparin (LOVENOX) injection  40 mg Subcutaneous Q24H   feeding supplement (PROSource TF)  45  mL Per Tube TID   insulin aspart  0-20 Units Subcutaneous Q4H  insulin glargine  24 Units Subcutaneous BID   multivitamin  15 mL Per Tube Daily   Continuous Infusions:  feeding supplement (OSMOLITE 1.5 CAL) 1,000 mL (03/28/21 1507)     LOS: 76 days   Signature  Susa Raring M.D on 03/29/2021 at 9:35 AM   -  To page go to [email or web address]

## 2021-03-29 NOTE — Plan of Care (Signed)

## 2021-03-29 NOTE — Progress Notes (Signed)
Daily Progress Note   Patient Name: Carolyn Norton       Date: 03/29/2021 DOB: Nov 08, 1965  Age: 55 y.o. MRN#: [id number] Attending Physician: Thurnell Lose, MD Primary Care Physician: Elbert Ewings, FNP Admit Date: 01/11/2021  Reason for Consultation/Follow-up:  To discuss complex medical decision making related to patient's goals of care  Patient Norton calm, very pleasant.  Ate some scrambled eggs for breakfast.  Her mother Norton feeding her a magic cup as I enter the room.  Carolyn Norton Norton alert, responsive, but childlike in her thinking and unable to manage any ADLs.   Subjective: Met with mother, father, and Carolyn Norton for approximately 70 minutes.  We discussed patient's hospitalization, past history, and the dementia that she Norton currently suffering with.   We talked at length about the fact that there Norton no reversing this progressive condition.  Carolyn Norton asked me for prognosis. I offered that without artificial intervention I expected Carolyn Norton to remain on with the same rapid rate of decline she has experienced since December.  Her prognosis Norton likely weeks to months without artificial intervention.  Family agrees to no permanent feeding tube.  They do not want her to live in a facility on a feeding tube. After discussion family Norton ready for artificial nutrition and hydration to be stopped and not restarted.   They would like to see what Carolyn Norton able to do on her own.    Carolyn Norton talked about the medicaid application.  The family's goal Norton to get medicaid approved and place the patient in SNF.  Carolyn Norton expresses how much they enjoy spending time with her, and that she still knows them.   She also mentions that this Norton especially hard for Carolyn Norton and Carolyn Norton, because they lost a son to leukemia when he  was in his 97s.  Carolyn Norton wants the best for his daughter but he also expresses that her life choices likely brought her to this point.  Carolyn Norton answers multiple questions with reference to God's will and putting it in God's hands.     In this meeting I confirmed that (1) Carolyn Norton and Carolyn Norton are the [employer]text[employer] for [employer]; (2) Carolyn Norton code status Norton DNR; (3) artificial nutrition and hydration will be stopped and not restarted.  (4) Tooth pain will be investigated and treated if possible  to support patient's ability to take POs.  Carolyn Norton requested "comfort care visitation status".  When I explained comfort care.  The family would like to give Carolyn Norton more time before making a shift to comfort care.  They are not ready to do that today.  Visitation rules remain.   I agreed to work with nursing to allow a visitor to be present with Carolyn Norton 24 hours per day as she Norton cognitively impaired and unable to make her needs known.  I recommended to the family that we have regularly scheduled meetings to improve communication.    Assessment: Patient calm, NAD, appears pleasantly demented.  Unable to feed herself.      Patient Profile/HPI From admitting H&P:  Carolyn Norton Norton a 55 y.o. female with medical history significant for DM2, bipolar disorder type 1, previous covid 19 infection, polysubstance abuse including cocaine, who presented from home where she lives with her parents due to generalized weakness, multiple falls and confusion.  Has not been compliant with her home medications.  Hx Norton obtained from EDP and from review of medical records.  The patient Norton alert and oriented x2.  She Norton not able to provide a reliable history due to confusion.  Called her mother x3 no answer.  Left 3 voicemail messages.  Work up in the ED revealed uncontrolled type 2 diabetes with no DKA and generalized weakness.  CBGs improved.  Due to generalized weakness and unsuccessful attempt to ambulate EDP requested  admission.     Length of Stay: 76   Vital Signs: BP (!) 142/77 (BP Location: Left Arm)   Pulse (!) 102   Temp 97.9 F (36.6 C)   Resp 16   Ht _0  (1.676 m) Comment: on 01/11/2021  Wt 70.7 kg   LMP 01/26/2017 (Approximate)   SpO2 100%   BMI 25.16 kg/m  SpO2: SpO2: 100 % O2 Device: O2 Device: Room Air O2 Flow Rate: O2 Flow Rate (L/min): 2 L/min       Palliative Assessment/Data:  30%     Palliative Care Plan    Recommendations/Plan: DNR, No further artificial nutrition or hydration.   Carolyn Norton and Carolyn Norton are patient's decision makers. Carolyn Norton Norton working hard to [employer] for [employer] in hopes of SNF placement. Family wants to give patient more time before declaring her comfort measures but they understand the trajectory she Norton on and that her prognosis Norton likely weeks to months. Regularly scheduled face to face communication with Palliative (weekly?) Norton recommended. Family requests tooth pain be worked up as this may support patient's efforts to eat.  Code Status:  DNR  Prognosis:  Weeks to months without artificial hydration or nutrition.  Discharge Planning: SNF (medicaid pending) vs possibly [place] if patient acutely declines.  Care plan was discussed with Dr. Candiss Norse, RN, Family.  Thank you for allowing the Palliative Medicine Team to assist in the care of this patient.  Total time spent:  105 Time In:  1:00 Time out:  2:45     Greater than 50%  of this time was spent counseling and coordinating care related to the above assessment and plan.  Florentina Jenny, PA-C Palliative Medicine  Please contact Palliative MedicineTeam phone at [phone] for questions and concerns between 7 am - 7 pm.   Please see AMION for individual provider pager numbers.

## 2021-03-30 ENCOUNTER — Inpatient Hospital Stay (HOSPITAL_COMMUNITY): Payer: Medicaid Other

## 2021-03-30 LAB — GLUCOSE, CAPILLARY
Glucose-Capillary: 123 mg/dL — ABNORMAL HIGH (ref 70–99)
Glucose-Capillary: 108 mg/dL — ABNORMAL HIGH (ref 70–99)
Glucose-Capillary: 161 mg/dL — ABNORMAL HIGH (ref 70–99)
Glucose-Capillary: 308 mg/dL — ABNORMAL HIGH (ref 70–99)
Glucose-Capillary: 52 mg/dL — ABNORMAL LOW (ref 70–99)
Glucose-Capillary: 217 mg/dL — ABNORMAL HIGH (ref 70–99)
Glucose-Capillary: 417 mg/dL — ABNORMAL HIGH (ref 70–99)
Glucose-Capillary: 353 mg/dL — ABNORMAL HIGH (ref 70–99)

## 2021-03-30 MED ORDER — METOPROLOL TARTRATE 50 MG PO TABS
50.0000 mg | ORAL_TABLET | Freq: Two times a day (BID) | ORAL | Status: DC
  Administered 2021-03-30 – 2021-04-30 (×62): 50 mg via ORAL
  Filled 2021-03-30 (×63): qty 1

## 2021-03-30 MED ORDER — INSULIN GLARGINE 100 UNIT/ML ~~LOC~~ SOLN
30.0000 [IU] | Freq: Every day | SUBCUTANEOUS | Status: DC
  Filled 2021-03-30: qty 0.3

## 2021-03-30 MED ORDER — INSULIN GLARGINE 100 UNIT/ML ~~LOC~~ SOLN
30.0000 [IU] | Freq: Two times a day (BID) | SUBCUTANEOUS | Status: DC
  Filled 2021-03-30: qty 0.3

## 2021-03-30 MED ORDER — PROSOURCE PLUS PO LIQD
30.0000 mL | Freq: Three times a day (TID) | ORAL | Status: DC
  Administered 2021-03-30 – 2021-04-30 (×86): 30 mL via ORAL
  Filled 2021-03-30 (×78): qty 30

## 2021-03-30 MED ORDER — GLUCOSE 40 % PO GEL
1.0000 | Freq: Once | ORAL | Status: AC
  Administered 2021-03-30: 31 g via ORAL
  Filled 2021-03-30: qty 1

## 2021-03-30 MED ORDER — AMLODIPINE BESYLATE 5 MG PO TABS
5.0000 mg | ORAL_TABLET | Freq: Every day | ORAL | Status: DC
  Administered 2021-03-30 – 2021-04-30 (×32): 5 mg via ORAL
  Filled 2021-03-30 (×32): qty 1

## 2021-03-30 MED ORDER — ADULT MULTIVITAMIN LIQUID CH
15.0000 mL | Freq: Every day | ORAL | Status: DC
  Administered 2021-03-30 – 2021-04-30 (×32): 15 mL via ORAL
  Filled 2021-03-30 (×33): qty 15

## 2021-03-30 NOTE — Progress Notes (Signed)
PROGRESS NOTE    Carolyn Norton   TDV:[id number]  DOB: 1966-05-24  DOA: 01/11/2021 PCP: [name], FNP   Brief Narrative:  Carolyn Norton a 55 year old female with past medical history of diabetes mellitus type 2, bipolar disorder, cocaine abuse, cognitive impairment who lives with her parents and is taken care of by them. She was unable to give a history in the ED.  She presented from home with hyperglycemia (CBG 700s), steady cognitive and function decline at home  with inability to ambulate with multiple falls over the past 3 weeks. She had been confused for 4 months (since she had COVID). She had stopped taking her meds 3 days prior to coming in which appeared to be the reason her parents brought her. She was unable to ambulate in the ED and had hyperglycemia (CBG 729).  She was admitted to [facility].  Her hospital stay has been complicated by progressive cognitive and functional decline.  She has had psychiatry, palliative care, neurology and neurosurgery consults but no cause for her rapid cognitive decline has been found.  At this point, PT has signed off not recommending her for SNF, but for some kind if long term care.  In discussion with patient's father, no plans for long-term feeding tube, but requested to try NG tube.  Nursing team unable to place NG tube due to patient compliance.  [facility] team consulted.  Patient about the same.  No complaints, but then does not respond to further questions  Subjective -  Patient in bed, appears comfortable, denies any headache, no fever, no chest pain or pressure, no shortness of breath , no abdominal pain. No new focal weakness.   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  She was subsequently  seen by neurology along with neurosurgery for suspicion of NPH, multiple large-volume LPs were done and eventually on 03/06/2021 clinically and after an LP neurology finally ruled out NPH.  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  Continue supportive care.  Look for appropriate placement.  Plan is to look for SNF if possible, if oral intake is poor and continued decline then family is open for residential hospice.  Case discussed with patient's father on 03/30/2021.  Severe lower extremity weakness (starting at least 1 month prior to admission)  -this is due to severe deconditioning, she had not been ambulating at home for several weeks prior to hospital visit, seen by neurology underwent extensive work-up including MRI's cervical thoracic lumbar spine nonrevealing, continue PT OT and supportive care.  Poor oral intake - weight on 5/7- 99.8 kg- weight today 67.7 kg, severe protein calorie malnutrition.  Initially placed on Megace but this was discontinued after hyperglycemia, father who is the chief decision-maker was consulted and he requested 7-day trial of NG tube with tube feeds which will be done.  Despite several days of NG tube feeds her condition continued to decline and family decided to remove NG tube on 03/29/2021.  Continue oral intake as tolerated with supportive care and monitoring.    Acute respiratory distress on 03/15/2021.  Due to aspiration pneumonitis.  Resolved.    COPD - no acute issues with this   E. coli and Klebsiella UTI- ~ 6/27  - Has  been treated   Bipolar disorder - she was on - Lexapro, Depakote and gabapentin a renewed on 03/05/2021 for recurrent sedation.   History of polysubstance abuse  - Including cocaine and alcohol, On 2/15 she was cocaine and benzodiazepine positive.  She has been extensively counseled to quit all.  DM2 uncontrolled - Hemoglobin A1c is 11 on 01/12/21 suggesting poor outpatient control due to  hyperglycemia.  Lantus dose adjusted on 03/30/2021 continue every 4 hour sliding scale for now.  CBG (last 3)  Recent Labs    03/30/21 0652 03/30/21 0726 03/30/21 1138  GLUCAP 108* 161* 308*     DVT prophylaxis: enoxaparin (LOVENOX) injection 40 mg Start: 03/24/21 1000 SCD's Start: 02/14/21 0829   Code Status: DNR Family Communication: Updated father Ephriam Knuckles [phone] - by phone on 03/22/21, 03/25/21, called 03/29/2021 at 9:35 AM.  Multiple rings no response, 03/30/21  Level of Care: Level of care: Med-Surg Disposition Plan:   Status is: Inpatient.  Further disposition to be decided on 03/29/2021 after family meeting  Remains inpatient appropriate because:Unsafe d/c plan  Dispo:  Patient From:  home with parents  Planned Disposition: Long-term care facility.  Medically stable for discharge: No    Antimicrobials:  Anti-infectives (From admission, onward)    Start     Dose/Rate Route Frequency Ordered Stop   03/06/21 0900  ceFAZolin (ANCEF) IVPB 1 g/50 mL premix        1 g 100 mL/hr over 30 Minutes Intravenous Every 8 hours 03/06/21 0801 03/10/21 0559   03/04/21 1430  cefTRIAXone (ROCEPHIN) 1 g in sodium chloride 0.9 % 100 mL IVPB  Status:  Discontinued        1 g 200 mL/hr over 30 Minutes Intravenous Every 24 hours 03/04/21 1335 03/06/21 0800   02/14/21 0828  vancomycin (VANCOREADY) IVPB 1000 mg/200 mL  Status:  Discontinued        1,000 mg 200 mL/hr over 60 Minutes Intravenous 60 min pre-op 02/14/21 0828 03/02/21 1139   02/13/21 2115  cefTRIAXone (ROCEPHIN) 1 g in sodium chloride 0.9 % 100 mL IVPB  Status:  Discontinued        1 g 200 mL/hr over 30 Minutes Intravenous Every 24 hours 02/13/21 2016 02/16/21 0844        Objective: Vitals:   03/28/21 1412 03/29/21 1351 03/29/21 2246 03/30/21 0335  BP: 125/73 (!) 142/77 (!) 155/84   Pulse: (!) 105 (!) 102 (!) 103   Resp:  16 18   Temp: 98.2 F (36.8 C) 97.9 F (36.6 C) 98.5 F (36.9 C)   TempSrc:   Oral   SpO2:  100%  98%   Weight:    69.3 kg  Height:       No intake or output data in the 24 hours ending 03/30/21 1157  Filed Weights   03/27/21 0342 03/28/21 0355 03/30/21 0335  Weight: 69.6 kg 70.7 kg 69.3 kg    Examination:  Awake but not completely alert, follows commands inconsistently, diffusely weak Legs (1/5) >> arms(3/5) [place].AT,[name] Supple Neck,No JVD, No cervical lymphadenopathy appriciated.  Symmetrical Chest wall movement, Good air movement bilaterally, CTAB RRR,No Gallops, Rubs or new Murmurs, No Parasternal Heave +ve B.Sounds, Abd Soft, No tenderness, No organomegaly appriciated, No rebound - guarding or rigidity. No Cyanosis, Clubbing or edema, No new Rash or bruise    Data Reviewed: I have personally reviewed following labs and imaging studies  CBC: Recent Labs  Lab 03/24/21 0856 03/25/21 0203 03/26/21 0222  WBC 9.2 8.9  9.1  NEUTROABS 4.8 4.1 4.6  HGB 11.8* 11.7* 11.6*  HCT 35.4* 35.4* 34.9*  MCV 89.2 89.6 89.9  PLT 308 310 326   Basic Metabolic Panel: Recent Labs  Lab 03/23/21 1624 03/24/21 0856 03/25/21 0203 03/26/21 0222  NA  --  135 136 133*  K 4.2 4.1 3.7 3.9  CL  --  102 102 99  CO2  --  26 26 26   GLUCOSE  --  237* 152* 266*  BUN  --  21* 25* 23*  CREATININE  --  0.75 0.69 0.78  CALCIUM  --  9.4 9.7 9.5  MG 1.7 1.7 1.7 1.7  PHOS 3.8 4.1  --   --    GFR: Estimated Creatinine Clearance: 76.1 mL/min (by C-G formula based on SCr of 0.78 mg/dL). Liver Function Tests: Recent Labs  Lab 03/24/21 0856 03/25/21 0203 03/26/21 0222  AST 11* 12* 12*  ALT 9 10 10   ALKPHOS 60 62 53  BILITOT 0.6 0.4 0.5  PROT 6.2* 6.7 6.2*  ALBUMIN 2.8* 3.0* 2.8*   No results for input(s): LIPASE, AMYLASE in the last 168 hours. No results for input(s): AMMONIA in the last 168 hours. Coagulation Profile: No results for input(s): INR, PROTIME in the last 168 hours. Cardiac Enzymes: No results for input(s): CKTOTAL, CKMB, CKMBINDEX, TROPONINI in the last 168  hours. BNP (last 3 results) No results for input(s): PROBNP in the last 8760 hours. HbA1C: No results for input(s): HGBA1C in the last 72 hours.  CBG: Recent Labs  Lab 03/29/21 2332 03/30/21 0317 03/30/21 0652 03/30/21 0726 03/30/21 1138  GLUCAP 247* 123* 108* 161* 308*   Lipid Profile: No results for input(s): CHOL, HDL, LDLCALC, TRIG, CHOLHDL, LDLDIRECT in the last 72 hours. Thyroid Function Tests: No results for input(s): TSH, T4TOTAL, FREET4, T3FREE, THYROIDAB in the last 72 hours. Anemia Panel: No results for input(s): VITAMINB12, FOLATE, FERRITIN, TIBC, IRON, RETICCTPCT in the last 72 hours. Urine analysis:    Component Value Date/Time   COLORURINE YELLOW 03/03/2021 0810   APPEARANCEUR CLEAR 03/03/2021 0810   LABSPEC 1.022 03/03/2021 0810   PHURINE 5.0 03/03/2021 0810   GLUCOSEU >=500 (A) 03/03/2021 0810   HGBUR NEGATIVE 03/03/2021 0810   BILIRUBINUR NEGATIVE 03/03/2021 0810   KETONESUR 80 (A) 03/03/2021 0810   PROTEINUR NEGATIVE 03/03/2021 0810   UROBILINOGEN 0.2 07/20/2014 2022   NITRITE NEGATIVE 03/03/2021 0810   LEUKOCYTESUR SMALL (A) 03/03/2021 0810     No results found for this or any previous visit (from the past 240 hour(s)).    Radiology Studies: No results found.  Scheduled Meds:  (feeding supplement) PROSource Plus  30 mL Oral TID BM   Benzocaine   Mouth/Throat BID   enoxaparin (LOVENOX) injection  40 mg Subcutaneous Q24H   insulin aspart  0-20 Units Subcutaneous Q4H   insulin glargine  24 Units Subcutaneous BID   metoprolol tartrate  50 mg Oral BID   multivitamin  15 mL Oral Daily   Continuous Infusions:     LOS: 77 days   Signature  03/05/2021 M.D on 03/30/2021 at 11:57 AM   -  To page go to [email or web address]

## 2021-03-30 NOTE — Progress Notes (Signed)
Hypoglycemic Event  CBG: 52  Treatment: 8 oz juice/soda and 1 tube glucose gel. Patient also ate pudden and her mom fed she some magic cp  Symptoms: None  Follow-up CBG: Time:1747 CBG Result:217  Possible Reasons for Event: Possible meal intake  Comments/MD notified:Dr Linard Millers, Benjaman Pott

## 2021-03-30 NOTE — Plan of Care (Signed)
?  Problem: Elimination: ?Goal: Will not experience complications related to bowel motility ?Outcome: Progressing ?  ?Problem: Pain Managment: ?Goal: General experience of comfort will improve ?Outcome: Progressing ?  ?Problem: Safety: ?Goal: Ability to remain free from injury will improve ?Outcome: Progressing ?  ?

## 2021-03-30 NOTE — Progress Notes (Signed)
CSW spoke with Carolyn Norton of [facility] - Carolyn Norton requested clinicals be faxed for MD review tomorrow - clinicals sent to [phone].  Edwin Dada, MSW, LCSW Transitions of Care  Clinical Social Worker II [phone]

## 2021-03-31 ENCOUNTER — Inpatient Hospital Stay (HOSPITAL_COMMUNITY): Payer: Medicaid Other

## 2021-03-31 LAB — CBC WITH DIFFERENTIAL/PLATELET
Abs Immature Granulocytes: 0.03 10*3/uL (ref 0.00–0.07)
Basophils Absolute: 0 10*3/uL (ref 0.0–0.1)
Basophils Relative: 0 %
Eosinophils Absolute: 0.2 10*3/uL (ref 0.0–0.5)
Eosinophils Relative: 2 %
HCT: 35.8 % — ABNORMAL LOW (ref 36.0–46.0)
Hemoglobin: 12 g/dL (ref 12.0–15.0)
Immature Granulocytes: 0 %
Lymphocytes Relative: 40 %
Lymphs Abs: 3.8 10*3/uL (ref 0.7–4.0)
MCH: 29.6 pg (ref 26.0–34.0)
MCHC: 33.5 g/dL (ref 30.0–36.0)
MCV: 88.2 fL (ref 80.0–100.0)
Monocytes Absolute: 0.9 10*3/uL (ref 0.1–1.0)
Monocytes Relative: 10 %
Neutro Abs: 4.6 10*3/uL (ref 1.7–7.7)
Neutrophils Relative %: 48 %
Platelets: 278 10*3/uL (ref 150–400)
RBC: 4.06 MIL/uL (ref 3.87–5.11)
RDW: 13.8 % (ref 11.5–15.5)
WBC: 9.5 10*3/uL (ref 4.0–10.5)
nRBC: 0 % (ref 0.0–0.2)

## 2021-03-31 LAB — PHOSPHORUS: Phosphorus: 4.3 mg/dL (ref 2.5–4.6)

## 2021-03-31 LAB — COMPREHENSIVE METABOLIC PANEL
ALT: 10 U/L (ref 0–44)
AST: 14 U/L — ABNORMAL LOW (ref 15–41)
Albumin: 3.1 g/dL — ABNORMAL LOW (ref 3.5–5.0)
Alkaline Phosphatase: 56 U/L (ref 38–126)
Anion gap: 10 (ref 5–15)
BUN: 22 mg/dL — ABNORMAL HIGH (ref 6–20)
CO2: 26 mmol/L (ref 22–32)
Calcium: 9.8 mg/dL (ref 8.9–10.3)
Chloride: 102 mmol/L (ref 98–111)
Creatinine, Ser: 0.79 mg/dL (ref 0.44–1.00)
GFR, Estimated: >60 mL/min (ref >60)
Glucose, Bld: 57 mg/dL — ABNORMAL LOW (ref 70–99)
Potassium: 3.3 mmol/L — ABNORMAL LOW (ref 3.5–5.1)
Sodium: 138 mmol/L (ref 135–145)
Total Bilirubin: 0.7 mg/dL (ref 0.3–1.2)
Total Protein: 6.4 g/dL — ABNORMAL LOW (ref 6.5–8.1)

## 2021-03-31 LAB — GLUCOSE, CAPILLARY
Glucose-Capillary: 165 mg/dL — ABNORMAL HIGH (ref 70–99)
Glucose-Capillary: 181 mg/dL — ABNORMAL HIGH (ref 70–99)
Glucose-Capillary: 187 mg/dL — ABNORMAL HIGH (ref 70–99)
Glucose-Capillary: 224 mg/dL — ABNORMAL HIGH (ref 70–99)
Glucose-Capillary: 268 mg/dL — ABNORMAL HIGH (ref 70–99)
Glucose-Capillary: 343 mg/dL — ABNORMAL HIGH (ref 70–99)
Glucose-Capillary: 448 mg/dL — ABNORMAL HIGH (ref 70–99)
Glucose-Capillary: 47 mg/dL — ABNORMAL LOW (ref 70–99)
Glucose-Capillary: 50 mg/dL — ABNORMAL LOW (ref 70–99)

## 2021-03-31 LAB — MAGNESIUM: Magnesium: 1.8 mg/dL (ref 1.7–2.4)

## 2021-03-31 LAB — C-REACTIVE PROTEIN: CRP: <0.5 mg/dL (ref <1.0)

## 2021-03-31 MED ORDER — INSULIN GLARGINE 100 UNIT/ML ~~LOC~~ SOLN
5.0000 [IU] | Freq: Every day | SUBCUTANEOUS | Status: DC
  Administered 2021-04-01 – 2021-04-02 (×2): 5 [IU] via SUBCUTANEOUS
  Filled 2021-03-31 (×2): qty 0.05

## 2021-03-31 MED ORDER — POTASSIUM CHLORIDE 20 MEQ PO PACK
40.0000 meq | PACK | Freq: Once | ORAL | Status: AC
  Administered 2021-03-31: 40 meq via ORAL
  Filled 2021-03-31: qty 2

## 2021-03-31 MED ORDER — INSULIN GLARGINE 100 UNIT/ML ~~LOC~~ SOLN
5.0000 [IU] | Freq: Once | SUBCUTANEOUS | Status: AC
  Administered 2021-03-31: 5 [IU] via SUBCUTANEOUS
  Filled 2021-03-31: qty 0.05

## 2021-03-31 MED ORDER — INSULIN ASPART 100 UNIT/ML IJ SOLN: 0.0000 [IU] | Freq: Three times a day (TID) | INTRAMUSCULAR | Status: DC

## 2021-03-31 MED ORDER — INSULIN ASPART 100 UNIT/ML IJ SOLN
0.0000 [IU] | INTRAMUSCULAR | Status: DC
  Administered 2021-03-31: 2 [IU] via SUBCUTANEOUS
  Administered 2021-03-31: 1 [IU] via SUBCUTANEOUS
  Administered 2021-03-31: 3 [IU] via SUBCUTANEOUS
  Administered 2021-03-31: 6 [IU] via SUBCUTANEOUS
  Administered 2021-04-01: 4 [IU] via SUBCUTANEOUS
  Administered 2021-04-01: 5 [IU] via SUBCUTANEOUS
  Administered 2021-04-01: 2 [IU] via SUBCUTANEOUS
  Administered 2021-04-01: 6 [IU] via SUBCUTANEOUS
  Administered 2021-04-01: 1 [IU] via SUBCUTANEOUS
  Administered 2021-04-02: 3 [IU] via SUBCUTANEOUS
  Administered 2021-04-02: 4 [IU] via SUBCUTANEOUS
  Administered 2021-04-02: 6 [IU] via SUBCUTANEOUS

## 2021-03-31 MED ORDER — INSULIN GLARGINE 100 UNIT/ML ~~LOC~~ SOLN: 15.0000 [IU] | Freq: Every day | SUBCUTANEOUS | Status: DC

## 2021-03-31 NOTE — Progress Notes (Addendum)
Inpatient Diabetes Program Recommendations  AACE/ADA: New Consensus Statement on Inpatient Glycemic Control (2015)  Target Ranges:  Prepandial:   less than 140 mg/dL      Peak postprandial:   less than 180 mg/dL (1-2 hours)      Critically ill patients:  140 - 180 mg/dL   Lab Results  Component Value Date   GLUCAP 187 (H) 03/31/2021   HGBA1C 8.8 (H) 03/20/2021    Review of Glycemic Control Results for [name], Carolyn Norton (MRN [id number]) as of 03/31/2021 09:41  Ref. Range 03/30/2021 23:28 03/31/2021 04:39 03/31/2021 04:42 03/31/2021 06:22 03/31/2021 07:50  Glucose-Capillary Latest Ref Range: 70 - 99 mg/dL 660 (H) 50 (L) 47 (L) 630 (H) 187 (H)    Current orders for Inpatient glycemic control: Novolog 0-6 units TID Novolog 20 units x 2  Inpatient Diabetes Program Recommendations:    Noted patient experiencing hypoglycemia following Novolog correction dosing. TF have been stopped. Following, patient received 31 g of Dextrose and thus, was hyperglycemic > 400's mg/dL. Clearly overcorrected. Then was given additional Novolog leading towards more hypoglycemia.   If oral intake continues to remain poor, would consider: -Changing correction back- Novolog 0-9 units Q4H -Allowing some permissive hyperglycemia ~250 mg/dL given plan for residential hospice and poor oral intake/multiple lows -Adding Levemir 12 units BID.  Secure chat sent to A. Rennis Harding, NP.   Thanks, Lujean Rave, MSN, RNC-OB Diabetes Coordinator [phone] (8a-5p)

## 2021-03-31 NOTE — Progress Notes (Signed)
LTM maint complete - no skin breakdown under: F7 , Fp1 F3 Reapplied Cz , C3 , F7 , O2 Placed stretched hair netting with chin strap [facility] monitored.

## 2021-03-31 NOTE — Progress Notes (Signed)
After cbg check of 52 at 0417 am. Pt was given juice. Pt was alert and asymptomatic. Recheck was 165. Will continue to monitor and tx as needed.

## 2021-03-31 NOTE — Procedures (Signed)
Patient Name: Kessler Kopinski  MRN: [id number]  Epilepsy Attending: Charlsie Quest  Referring Physician/Provider: [name], NP Date: 03/31/2021 Duration: 25.32 mins  Patient history: 54yo F with ams, intermittent staring. EEG to evaluate for seizure.  Level of alertness: Awake  AEDs during EEG study: None  Technical aspects: This EEG study was done with scalp electrodes positioned according to the 10-20 International system of electrode placement. Electrical activity was acquired at a sampling rate of 500Hz  and reviewed with a high frequency filter of 70Hz  and a low frequency filter of 1Hz . EEG data were recorded continuously and digitally stored.   Description: No posterior dominant rhythm was seen. EEG showed continuous generalized polymorphic sharply contoured 3 to 6 Hz theta-delta slowing. Hyperventilation and photic stimulation were not performed.     ABNORMALITY - Continuous slow, generalized  IMPRESSION: This study is suggestive of moderate diffuse encephalopathy, nonspecific etiology. No seizures or definite epileptiform discharges were seen throughout the recording.  Talayeh Bruinsma 

## 2021-03-31 NOTE — Progress Notes (Addendum)
[facility] PROGRESS NOTE  Carolyn Norton TAV:[id number] DOB: Feb 06, 1966 DOA: 01/11/2021 PCP: Carolyn Lodge, FNP            March 27, 2021     Status: Remains inpatient appropriate because:Unsafe d/c plan-due to patient's underlying poor mental status short-term memory deficits she will require 24/7 care after discharge  Dispo:  Patient From:  Home  Planned Disposition: SNF versus residential hospice  Medically stable for discharge:  yes  Barriers to DC: Medicaid still pending; prior history of substance abuse and underlying documented bipolar disorder             Difficult to place: Yes  Level of care: Med-Surg  Code Status: DNR Family Communication: Father 7/22 updated regarding likelihood Long COVID neurological syndrome contributing to rapid decline DVT prophylaxis: Lovenox COVID vaccination status: Unknown-post COVID infection 09/06/20   HPI: 55 year old female past medical history of diabetes mellitus type 2, bipolar disorder type I, previous COVID infection, polysubstance abuse that includes cocaine.  She presented from home where she lives with her parents.  Her presenting symptoms included generalized weakness, multiple falls and confusion.  Family confirmed that she had been having the symptoms for at least a month with last use of crack cocaine 1 month prior to presentation.  Ever since that time she had not been doing well.  She would also be having issues with hyperglycemia.  In the ER she was diagnosed with severe dehydration, hyperglycemia and metabolic encephalopathy.  Since admission patient has been evaluated by the psychiatric team who has deemed her alert and oriented and having capacity to make decisions.  Patient is agreeable to placement at a rehab.  Currently she has severe physical deconditioning which was POA and PT is recommending SNF.  Because of her history of polysubstance abuse been difficult to obtain an SNF bed.  Initially her  family is agreeable to taking her back in home when she is able to ambulate well enough to get back and forth for meals into the bathroom.  Unfortunately her cognitive and physical status has progressively declined during this admission.  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  Palliative medicine is continuing to follow along.  Patient's previous advanced directives stated she did not want a PEG tube if she were unable to eat.  Palliative medicine has had multiple meetings with the family on 7/15 decision was made to place cortrack tube for short-term feedings to see if patient demonstrates any improvement in symptoms.   Unfortunately no improvement. Cortrak dc'd. Famliy not ready for comfort measures. Concerned that [name] not eating due to dental pain. Multiple evaluations including imaging w/o abscess. Suspect has cavities which cannot be addressed in the IP setting. It is unlikely this is influencing her intake since her primary complaint for many weeks has been that she is not hungry.  Subjective: Awake.  Initially appeared more awake and in the past week.  Slightly more talkative but unable to sustain conversational speech.  Any questions directed at her that were more than how are you, etc. she was unable to answer.  She seemed to become perplexed and would become withdrawn and developed a blank stare during conversation in order typical orientation questions.  When asked what year it was she responded with "I do not know".  Objective: Vitals:   03/30/21 2100 03/31/21 0446  BP: (!) 152/86 122/73  Pulse: (!) 109 90  Resp: 18 18  Temp: 98 F (36.7 C) 98 F (36.7 C)  SpO2: 98% 97%    Intake/Output Summary (Last 24 hours) at 03/31/2021 0747 Last data filed at 03/30/2021 2230 Gross per 24 hour  Intake --  Output 1100 ml  Net -1100 ml    Filed Weights   03/27/21 0342 03/28/21 0355 03/30/21 0335   Weight: 69.6 kg 70.7 kg 69.3 kg    Exam:  Constitutional: Awake, no acute physical distress, appears pale and chronically ill Respiratory: Lung sounds clear to auscultation and stable on RA, no wheezing, cough or increased work of breathing Cardiac: S1-S2, pulse remains regular, normotensive without peripheral edema. Abdomen: LBM 7/24, nontender nondistended with normoactive bowel sounds.  Oral intake remains poor Neurologic: Cranial nerves appear grossly intact, sensation intact all extremities.  No spontaneous movement of extremities and has not been able to follow commands. Psychiatric: Awake but oriented only to name.  Attempts to interact with patient and get her to engage in conversational speech very limited.  Occasionally she will answer a simple question 1 time but more complex questions such as "do your teeth hurt when you were eating" resolved initially and perplexed look and in a blank stare.  It takes combination of both tactile and verbal stimulation to redirect patient to speaker.  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  Initially felt to be partially due to NPH physiology given some improved after initial large-volume LP procedure.  Unfortunately repeat large-volume LP without similar results.  MRI of her spine without any abnormalities to explain her progressive lower extremity weakness. Neurology suspects chronic progressive neurological decline secondary to longstanding drug abuse and it is also suspected that Long COVID neurological syndrome contributing.  Unfortunately there are are no known treatments available at this time to treat effects of long COVID neurological syndrome.  Not have  acute encephalitis on her MRI or other reversible/inflammatory treatable causes to neurological changes after COVID. Repeat EEG- last completed 5/30 and was unremarkable  Dysphagia/failure to thrive secondary to multifactorial neurological condition Appetite did not improve with Megace and this was discontinued secondary to significant hyperglycemia Did not improve after 10 days of enteral feedings.  Family decided to honor patient's wishes and had not pursued PEG tube that are not yet ready for comfort care  Acute hypoxemic respiratory failure with underlying COPD Stable on room air Continue duo nebs to prn  Diabetes mellitus 2 with hyperglycemia on insulin Preadmission hemoglobin A1c greater than 11 Now that tube feedings have been discontinued patient has been having variable CBG readings with lows in the 40s and highs in the 400s Have adjusted SSI to very sensitive every 4 hour Just after lunch today on 7/25 she had peak CBG for 48 was given 6 units of regular insulin.  We will go ahead and begin Lantus 5 units daily with first dose now at 2 PM  Elevated blood pressure No prior documented history of hypertension  Continue low-dose Norvasc  Profound physical deconditioning/critical illness myopathy Likely a combination of persistent altered mental status and inability to participate consistently with PT as well as myopathy of critical illness in association with long COVID neurological syndrome  History of bipolar 1 disorder/polysubstance abuse/cognitive impairment Continue to hold home escitalopram, Depakote and Neurontin secondary to persistent altered mentation and bipolar  symptoms not present  Ventriculomegaly/? NPH NPH ruled out as above  Dyslipidemia Hold statin until oral intake improves  Physical deconditioning/bilateral heel cord shortening Continue bilateral PRAFO boots on every 4 hours and off every 4 hours Given progressive neurological decline unable to participate  with therapies    Other problems: GERD Oral PPI on hold while n.p.o.  Hypokalemia/hypomagnesemia Resolved  E. Coli [name] UTI Treated Use purewick only at bedtime    Data Reviewed: Basic Metabolic Panel: Recent Labs  Lab 03/24/21 0856 03/25/21 0203 03/26/21 0222 03/31/21 0349  NA 135 136 133* 138  K 4.1 3.7 3.9 3.3*  CL 102 102 99 102  CO2 26 26 26 26   GLUCOSE 237* 152* 266* 57*  BUN 21* 25* 23* 22*  CREATININE 0.75 0.69 0.78 0.79  CALCIUM 9.4 9.7 9.5 9.8  MG 1.7 1.7 1.7 1.8  PHOS 4.1  --   --  4.3   Liver Function Tests: Recent Labs  Lab 03/24/21 0856 03/25/21 0203 03/26/21 0222 03/31/21 0349  AST 11* 12* 12* 14*  ALT 9 10 10 10   ALKPHOS 60 62 53 56  BILITOT 0.6 0.4 0.5 0.7  PROT 6.2* 6.7 6.2* 6.4*  ALBUMIN 2.8* 3.0* 2.8* 3.1*    No results for input(s): LIPASE, AMYLASE in the last 168 hours. No results for input(s): AMMONIA in the last 168 hours. CBC: Recent Labs  Lab 03/24/21 0856 03/25/21 0203 03/26/21 0222 03/31/21 0349  WBC 9.2 8.9 9.1 9.5  NEUTROABS 4.8 4.1 4.6 4.6  HGB 11.8* 11.7* 11.6* 12.0  HCT 35.4* 35.4* 34.9* 35.8*  MCV 89.2 89.6 89.9 88.2  PLT 308 310 326 278   Cardiac Enzymes: No results for input(s): CKTOTAL, CKMB, CKMBINDEX, TROPONINI in the last 168 hours. BNP (last 3 results) Recent Labs    03/24/21 0856 03/25/21 0203 03/26/21 0222  BNP 29.2 33.9 37.9    ProBNP (last 3 results) No results for input(s): PROBNP in the last 8760 hours.  CBG: Recent Labs  Lab 03/30/21 2225 03/30/21 2328 03/31/21 0439 03/31/21 0442 03/31/21 0622  GLUCAP 417* 353* 50* 47* 165*    No results found for this or any previous visit (from the past 240 hour(s)).     Studies: DG Chest Port 1 View  Result Date: 03/30/2021 CLINICAL DATA:  Shortness of breath. Evaluate pleural effusion or pneumothorax. EXAM: PORTABLE CHEST 1 VIEW COMPARISON:  March 20, 2021 FINDINGS: No pneumothorax. The heart, hila, mediastinum, lungs, and  pleura are unremarkable. No effusion identified. IMPRESSION: No active disease. Electronically Signed   By: 04/01/2021 III M.D   On: 03/30/2021 14:16    Scheduled Meds:  (feeding supplement) PROSource Plus  30 mL Oral TID BM   amLODipine  5 mg Oral Daily   Benzocaine   Mouth/Throat BID   enoxaparin (LOVENOX) injection  40 mg Subcutaneous Q24H   insulin aspart  0-6 Units Subcutaneous TID WC   metoprolol tartrate  50 mg Oral BID   multivitamin  15 mL Oral Daily   potassium chloride  40 mEq Oral Once   Continuous Infusions:      Principal Problem:   Bipolar 1 disorder, depressed, full remission (HCC) Active Problems:   Bipolar 1 disorder (HCC)   Hypertension   Generalized weakness   Controlled type 2 diabetes mellitus with hyperglycemia, with long-term current use of insulin (HCC)   Hyperlipidemia   NPH (normal pressure hydrocephalus) (HCC)   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   Protein-calorie malnutrition, severe  Consultants: Psychiatry Neurology Neurosurgery  Procedures: None  Antibiotics: Anti-infectives (From admission, onward)    Start     Dose/Rate Route Frequency Ordered Stop   03/06/21 0900  ceFAZolin (ANCEF) IVPB 1 g/50 mL premix        1 g 100 mL/hr over 30 Minutes Intravenous Every 8 hours 03/06/21 0801 03/10/21 0559   03/04/21 1430  cefTRIAXone (ROCEPHIN) 1 g in sodium chloride 0.9 % 100 mL IVPB  Status:  Discontinued        1 g 200 mL/hr over 30 Minutes Intravenous Every 24 hours 03/04/21 1335 03/06/21 0800   02/14/21 0828  vancomycin (VANCOREADY) IVPB 1000 mg/200 mL  Status:  Discontinued        1,000 mg 200 mL/hr over 60 Minutes Intravenous 60 min pre-op 02/14/21 0828 03/02/21 1139   02/13/21 2115  cefTRIAXone (ROCEPHIN) 1 g in sodium chloride 0.9 % 100 mL IVPB  Status:  Discontinued        1 g 200 mL/hr over 30 Minutes Intravenous Every 24 hours 02/13/21 2016 02/16/21 0844        Time spent: 15 minutes    Junious SilkAllison Dio Giller ANP  [facility] 7 am - 330 pm/M-F for direct patient care and secure chat Please refer to Amion for contact info 78  days

## 2021-03-31 NOTE — Progress Notes (Signed)
LTM EEG hooked up and running - no initial skin breakdown - push button tested - neuro notified. [facility] monitoring.  

## 2021-03-31 NOTE — Progress Notes (Signed)
EEG complete - results pending 

## 2021-03-31 NOTE — Progress Notes (Signed)
(  Delayed entry) LTM maint complete - no skin breakdown  Staff was alerted by nursing that a few leads had came off, leads were reapplied. Hairnet placed and secured. az/qm

## 2021-03-31 NOTE — TOC Progression Note (Signed)
Transition of Care [facility]) - Progression Note    Patient Details  Name: Carolyn Norton MRN: [id number] Date of Birth: 1966-08-19  Transition of Care [facility]) CM/SW Contact  Janae Bridgeman, RN Phone Number: 03/31/2021, 8:09 AM  Clinical Narrative:    Case management spoke with Edwin Dada, MSW and [facility] home received clinicals yesterday and plan to review the patient for possible admission.  The palliative care note addresses that the family has declined comfort care measures at this time and PEG placement.  I faxed the patient out to area SNF facilities again for possible placement while Medicaid is pending.  Clinicals were placed int he hub for [facility], [facility], [facility], and [facility] facilities in hopes of finding an accepting bed offer for this patient.  CM and MSW with DTP Team continue to follow the patient for transitions of care needs.   Expected Discharge Plan: Hospice Medical Facility Barriers to Discharge: Inadequate or no insurance, Family Issues  Expected Discharge Plan and Services Expected Discharge Plan: Hospice Medical Facility In-house Referral: Clinical Social Work Discharge Planning Services: CM Consult Post Acute Care Choice: Skilled Nursing Facility Living arrangements for the past 2 months: Single Family Home Expected Discharge Date: 01/16/21               DME Arranged: [name] rolling DME Agency: [employer] Date DME Agency Contacted: 01/20/21 Time DME Agency Contacted: 1435 Representative spoke with at DME Agency: [name]             Social Determinants of Health (SDOH) Interventions    Readmission Risk Interventions Readmission Risk Prevention Plan 01/14/2021  Transportation Screening Complete  PCP or Specialist Appt within 3-5 Days (No Data)  Social Work Consult for Recovery Care Planning/Counseling Complete  Palliative Care Screening Not Applicable  Medication Review [facility]) Referral to  Pharmacy  Some recent data might be hidden

## 2021-04-01 ENCOUNTER — Inpatient Hospital Stay (HOSPITAL_COMMUNITY): Payer: Medicaid Other

## 2021-04-01 DIAGNOSIS — R630 Anorexia: Secondary | ICD-10-CM

## 2021-04-01 LAB — COMPREHENSIVE METABOLIC PANEL
ALT: 12 U/L (ref 0–44)
AST: 12 U/L — ABNORMAL LOW (ref 15–41)
Albumin: 2.9 g/dL — ABNORMAL LOW (ref 3.5–5.0)
Alkaline Phosphatase: 58 U/L (ref 38–126)
Anion gap: 8 (ref 5–15)
BUN: 27 mg/dL — ABNORMAL HIGH (ref 6–20)
CO2: 26 mmol/L (ref 22–32)
Calcium: 9.9 mg/dL (ref 8.9–10.3)
Chloride: 103 mmol/L (ref 98–111)
Creatinine, Ser: 0.89 mg/dL (ref 0.44–1.00)
GFR, Estimated: >60 mL/min (ref >60)
Glucose, Bld: 182 mg/dL — ABNORMAL HIGH (ref 70–99)
Potassium: 3.6 mmol/L (ref 3.5–5.1)
Sodium: 137 mmol/L (ref 135–145)
Total Bilirubin: 0.7 mg/dL (ref 0.3–1.2)
Total Protein: 6.7 g/dL (ref 6.5–8.1)

## 2021-04-01 LAB — GLUCOSE, CAPILLARY
Glucose-Capillary: 203 mg/dL — ABNORMAL HIGH (ref 70–99)
Glucose-Capillary: 165 mg/dL — ABNORMAL HIGH (ref 70–99)
Glucose-Capillary: 376 mg/dL — ABNORMAL HIGH (ref 70–99)
Glucose-Capillary: 348 mg/dL — ABNORMAL HIGH (ref 70–99)
Glucose-Capillary: 557 mg/dL (ref 70–99)
Glucose-Capillary: 307 mg/dL — ABNORMAL HIGH (ref 70–99)

## 2021-04-01 LAB — CBC WITH DIFFERENTIAL/PLATELET
Abs Immature Granulocytes: 0.04 10*3/uL (ref 0.00–0.07)
Basophils Absolute: 0 10*3/uL (ref 0.0–0.1)
Basophils Relative: 0 %
Eosinophils Absolute: 0.2 10*3/uL (ref 0.0–0.5)
Eosinophils Relative: 2 %
HCT: 35.7 % — ABNORMAL LOW (ref 36.0–46.0)
Hemoglobin: 11.8 g/dL — ABNORMAL LOW (ref 12.0–15.0)
Immature Granulocytes: 0 %
Lymphocytes Relative: 45 %
Lymphs Abs: 4.4 10*3/uL — ABNORMAL HIGH (ref 0.7–4.0)
MCH: 29.5 pg (ref 26.0–34.0)
MCHC: 33.1 g/dL (ref 30.0–36.0)
MCV: 89.3 fL (ref 80.0–100.0)
Monocytes Absolute: 0.9 10*3/uL (ref 0.1–1.0)
Monocytes Relative: 9 %
Neutro Abs: 4.5 10*3/uL (ref 1.7–7.7)
Neutrophils Relative %: 44 %
Platelets: 449 10*3/uL — ABNORMAL HIGH (ref 150–400)
RBC: 4 MIL/uL (ref 3.87–5.11)
RDW: 13.9 % (ref 11.5–15.5)
WBC: 10.1 10*3/uL (ref 4.0–10.5)
nRBC: 0 % (ref 0.0–0.2)

## 2021-04-01 LAB — MAGNESIUM: Magnesium: 1.8 mg/dL (ref 1.7–2.4)

## 2021-04-01 LAB — C-REACTIVE PROTEIN: CRP: <0.5 mg/dL (ref <1.0)

## 2021-04-01 MED ORDER — GADOBUTROL 1 MMOL/ML IV SOLN
7.0000 mL | Freq: Once | INTRAVENOUS | Status: AC | PRN
  Administered 2021-04-01: 7 mL via INTRAVENOUS

## 2021-04-01 NOTE — Progress Notes (Signed)
Palliative:  HPI: 55 year old female past medical history of diabetes mellitus type 2, bipolar disorder type I, previous COVID infection, polysubstance abuse that includes cocaine.  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  She is not eating or drinking sufficiently.  Palliative care has been asked to get involved to further discuss goals of care in the setting of progressive failure to thrive.  I met again today with Carolyn Norton. She smiles and responds to my greeting. She does not really answer any further questions or talk any more to me. She does tells me goodbye and to have a good day. She did deny pain. She was resting comfortably at time of my visit. RN reports that she does accept fluids and drinking but only a couple bites of food eating which seems to be her baseline for some time now.   I called and spoke with father, Carolyn Norton. I reported the above. Carolyn Norton shares that he and his wife are coming by this evening and will try and get Zriyah to eat some more for dinner. I did reassure him that the nursing staff is assisting and trying to encourage her to eat but she is just not hungry and not able to eat much. We discussed that Sumaya is drinking enough and eating a little here and there to get her by for a while but this will only last so long. Carolyn Norton confirms that there are no plans or desire for artificial feeding. I discussed with him my recommendation to consider the addition of hospice support at facility for [facility] when placement is found. I explained that hospice would be extra care for her, communication with family, and assistance with symptom management and care when she further declines. Carolyn Norton understands and will consider.   All questions/concerns addressed. Emotional support provided.   Exam: Sleepy but easily awakens to voice. No distress. Continuous EEG going. Breathing regular, unlabored. Abd soft. Generalized weakness and  fatigue.   Plan: - Poor intake: Consider Zyprexa 2.5 mg qhs trial. Not sure how helpful overall this will be but may be worth a try given family goals of still being hopeful for increased intake.  - Recommended hospice to follow at facility at time of discharge (not a candidate for hospice facility at current time). Father says they will consider this.  [place], NP Palliative Medicine Team Pager [phone] (Please see [email or web address] for schedule) Team Phone [phone]    Greater than 50%  of this time was spent counseling and coordinating care related to the above assessment and plan

## 2021-04-01 NOTE — Progress Notes (Signed)
[facility] PROGRESS NOTE  Michille Mcelrath FKC:[id number] DOB: 30-Jul-1966 DOA: 01/11/2021 PCP: [name], FNP            March 27, 2021     Status: Remains inpatient appropriate because:Unsafe d/c plan-due to patient's underlying poor mental status short-term memory deficits she will require 24/7 care after discharge  Dispo:  Patient From:  Home  Planned Disposition: SNF versus residential hospice  Medically stable for discharge:  yes  Barriers to DC: Medicaid still pending; prior history of substance abuse and underlying documented bipolar disorder             Difficult to place: Yes  Level of care: Med-Surg  Code Status: DNR Family Communication: Father 7/22 updated regarding likelihood Long COVID neurological syndrome contributing to rapid decline DVT prophylaxis: Lovenox COVID vaccination status: Unknown-post COVID infection 09/06/20   HPI: 55 year old female past medical history of diabetes mellitus type 2, bipolar disorder type I, previous COVID infection, polysubstance abuse that includes cocaine.  She presented from home where she lives with her parents.  Her presenting symptoms included generalized weakness, multiple falls and confusion.  Family confirmed that she had been having the symptoms for at least a month with last use of crack cocaine 1 month prior to presentation.  Ever since that time she had not been doing well.  She would also be having issues with hyperglycemia.  In the ER she was diagnosed with severe dehydration, hyperglycemia and metabolic encephalopathy.  Since admission patient has been evaluated by the psychiatric team who has deemed her alert and oriented and having capacity to make decisions.  Patient is agreeable to placement at a rehab.  Currently she has severe physical deconditioning which was POA and PT is recommending SNF.  Because of her history of polysubstance abuse been difficult to obtain an SNF bed.  Initially her  family is agreeable to taking her back in home when she is able to ambulate well enough to get back and forth for meals into the bathroom.  Unfortunately her cognitive and physical status has progressively declined during this admission.  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  Palliative medicine is continuing to follow along.  Patient's previous advanced directives stated she did not want a PEG tube if she were unable to eat.  Palliative medicine has had multiple meetings with the family on 7/15 decision was made to place cortrack tube for short-term feedings to see if patient demonstrates any improvement in symptoms.   Unfortunately no improvement. Cortrak dc'd. Famliy not ready for comfort measures. Concerned that Cherylee not eating due to dental pain. Multiple evaluations including imaging w/o abscess. Suspect has cavities which cannot be addressed in the IP setting. It is unlikely this is influencing her intake since her primary complaint for many weeks has been that she is not hungry.  Subjective: Patient much more alert today.  Smiling.  Able to provide answers to orientation questions although they were wrong.  She was able to follow simple commands lifting left leg off the bed.  Dental pain.  Objective: Vitals:   03/31/21 2229 04/01/21 0510  BP: 129/88 138/74  Pulse: 73 84  Resp: 20 16  Temp: 98 F (36.7 C) 98.5 F (36.9 C)  SpO2: 97% 95%    Intake/Output Summary (Last 24 hours) at 04/01/2021 0747 Last data filed at 03/31/2021 1349 Gross per 24 hour  Intake --  Output 700 ml  Net -700 ml    Filed Weights   03/28/21 0355 03/30/21 0335 04/01/21 0736  Weight: 70.7 kg 69.3 kg 70.6 kg    Exam:  Constitutional: Awake and in no acute distress Respiratory: Lungs are clear, no increased work of breathing, room air Cardiac: S1-S2, normotensive, no peripheral edema. Abdomen: Soft nontender  nondistended.  Normoactive bowel sounds. LBM 7/26-still with poor intake and only eating about 20 to 25% of meals Neurologic: Cranial nerves appear grossly intact, sensation intact all extremities.  No spontaneous movement of extremities  Psychiatric: Awake, remains oriented to name only.  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  Initially felt to be partially due to NPH physiology given some improved after initial large-volume LP procedure.  Unfortunately repeat large-volume LP without similar results.  MRI of her spine without any abnormalities to explain her progressive lower extremity weakness. Neurology suspects chronic progressive neurological decline secondary to longstanding drug abuse and it is also suspected that Long COVID neurological syndrome contributing.  Unfortunately there are are no known treatments available at this time to treat effects of long COVID neurological syndrome.  Not have acute encephalitis on her MRI or other reversible/inflammatory treatable causes to neurological changes after COVID. Repeat EEG 7/25 with moderate diffuse encephalopathy no evidence of seizures or other abnormality. For completeness of evaluation we will repeat an MRI with and without contrast  Dysphagia/failure to thrive secondary to multifactorial neurological condition Appetite did not improve with Megace and this was discontinued secondary to significant hyperglycemia Did not improve after 10 days of enteral feedings.  Family decided to honor patient's wishes and had not pursued PEG tube that are not yet ready for comfort care  Acute hypoxemic respiratory  failure with underlying COPD Stable on room air Continue duo nebs to prn  Diabetes mellitus 2 with hyperglycemia on insulin Preadmission hemoglobin A1c greater than 11 CBGs better controlled with the addition of low-dose Lantus Continue very sensitive SSI  Elevated blood pressure No prior documented history of hypertension  Continue low-dose Norvasc  Profound physical deconditioning/critical illness myopathy Likely a combination of persistent altered mental status and inability to participate consistently with PT as well as myopathy of critical illness in association with long COVID neurological syndrome  History of bipolar 1 disorder/polysubstance abuse/cognitive impairment Continue to hold home escitalopram, Depakote and Neurontin secondary to persistent altered mentation and bipolar symptoms not present  Ventriculomegaly/? NPH NPH ruled out as above  Dyslipidemia Hold statin until oral intake improves  Physical deconditioning/bilateral heel cord shortening Continue bilateral PRAFO boots on every 4 hours and off every 4 hours Given progressive neurological decline unable to participate with therapies    Other problems: GERD Oral PPI on hold while n.p.o.  Hypokalemia/hypomagnesemia Resolved  E. Coli [name] UTI Treated Use purewick only at bedtime    Data Reviewed: Basic Metabolic Panel: Recent Labs  Lab 03/26/21 0222 03/31/21 0349 04/01/21 0128  NA 133* 138 137  K 3.9 3.3* 3.6  CL 99 102 103  CO2 26 26 26   GLUCOSE 266* 57* 182*  BUN 23* 22* 27*  CREATININE 0.78 0.79 0.89  CALCIUM 9.5 9.8 9.9  MG 1.7 1.8 1.8  PHOS  --  4.3  --    Liver  Function Tests: Recent Labs  Lab 03/26/21 0222 03/31/21 0349 04/01/21 0128  AST 12* 14* 12*  ALT 10 10 12   ALKPHOS 53 56 58  BILITOT 0.5 0.7 0.7  PROT 6.2* 6.4* 6.7  ALBUMIN 2.8* 3.1* 2.9*    No results for input(s): LIPASE, AMYLASE in the last 168 hours. No results for input(s): AMMONIA in the last 168  hours. CBC: Recent Labs  Lab 03/26/21 0222 03/31/21 0349 04/01/21 0128  WBC 9.1 9.5 10.1  NEUTROABS 4.6 4.6 4.5  HGB 11.6* 12.0 11.8*  HCT 34.9* 35.8* 35.7*  MCV 89.9 88.2 89.3  PLT 326 278 449*   Cardiac Enzymes: No results for input(s): CKTOTAL, CKMB, CKMBINDEX, TROPONINI in the last 168 hours. BNP (last 3 results) Recent Labs    03/24/21 0856 03/25/21 0203 03/26/21 0222  BNP 29.2 33.9 37.9    ProBNP (last 3 results) No results for input(s): PROBNP in the last 8760 hours.  CBG: Recent Labs  Lab 03/31/21 1631 03/31/21 2003 03/31/21 2351 04/01/21 0438 04/01/21 0737  GLUCAP 268* 224* 181* 203* 165*    No results found for this or any previous visit (from the past 240 hour(s)).     Studies: DG Chest Port 1 View  Result Date: 03/30/2021 CLINICAL DATA:  Shortness of breath. Evaluate pleural effusion or pneumothorax. EXAM: PORTABLE CHEST 1 VIEW COMPARISON:  March 20, 2021 FINDINGS: No pneumothorax. The heart, hila, mediastinum, lungs, and pleura are unremarkable. No effusion identified. IMPRESSION: No active disease. Electronically Signed   By: Gerome Samavid  Williams III M.D   On: 03/30/2021 14:16   EEG adult  Result Date: 03/31/2021 Charlsie QuestYadav, Priyanka O, MD     03/31/2021  5:51 PM Patient Name: Lucilla LameMichelle Lee Schack MRN: [id number] Epilepsy Attending: Charlsie QuestPriyanka O Yadav Referring Physician/Provider: Junious SilkAllison Zamiyah Resendes, NP Date: 03/31/2021 Duration: 25.32 mins Patient history: 54yo F with ams, intermittent staring. EEG to evaluate for seizure. Level of alertness: Awake AEDs during EEG study: None Technical aspects: This EEG study was done with scalp electrodes positioned according to the 10-20 International system of electrode placement. Electrical activity was acquired at a sampling rate of 500Hz  and reviewed with a high frequency filter of 70Hz  and a low frequency filter of 1Hz . EEG data were recorded continuously and digitally stored. Description: No posterior dominant rhythm was seen. EEG  showed continuous generalized polymorphic sharply contoured 3 to 6 Hz theta-delta slowing. Hyperventilation and photic stimulation were not performed.   ABNORMALITY - Continuous slow, generalized IMPRESSION: This study is suggestive of moderate diffuse encephalopathy, nonspecific etiology. No seizures or definite epileptiform discharges were seen throughout the recording. Priyanka Annabelle Harman Yadav    Scheduled Meds:  (feeding supplement) PROSource Plus  30 mL Oral TID BM   amLODipine  5 mg Oral Daily   Benzocaine   Mouth/Throat BID   enoxaparin (LOVENOX) injection  40 mg Subcutaneous Q24H   insulin aspart  0-6 Units Subcutaneous Q4H   insulin glargine  5 Units Subcutaneous Daily   metoprolol tartrate  50 mg Oral BID   multivitamin  15 mL Oral Daily   Continuous Infusions:      Principal Problem:   Bipolar 1 disorder, depressed, full remission (HCC) Active Problems:   Bipolar 1 disorder (HCC)   Hypertension   Generalized weakness   Controlled type 2 diabetes mellitus with hyperglycemia, with long-term current use of insulin (HCC)   Hyperlipidemia   NPH (normal pressure hydrocephalus) (HCC)   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   Protein-calorie malnutrition, severe  Consultants: Psychiatry Neurology Neurosurgery  Procedures: None  Antibiotics: Anti-infectives (From admission, onward)    Start     Dose/Rate Route Frequency Ordered Stop   03/06/21 0900  ceFAZolin (ANCEF) IVPB 1 g/50 mL premix        1 g 100 mL/hr over 30 Minutes Intravenous Every 8 hours 03/06/21 0801 03/10/21 0559   03/04/21 1430  cefTRIAXone (ROCEPHIN) 1 g in sodium chloride 0.9 % 100 mL IVPB  Status:  Discontinued        1 g 200 mL/hr over 30 Minutes Intravenous Every 24 hours 03/04/21 1335 03/06/21 0800   02/14/21 0828  vancomycin (VANCOREADY) IVPB 1000 mg/200 mL  Status:  Discontinued        1,000 mg 200 mL/hr over 60 Minutes Intravenous 60 min pre-op 02/14/21 0828 03/02/21 1139   02/13/21 2115  cefTRIAXone  (ROCEPHIN) 1 g in sodium chloride 0.9 % 100 mL IVPB  Status:  Discontinued        1 g 200 mL/hr over 30 Minutes Intravenous Every 24 hours 02/13/21 2016 02/16/21 0844        Time spent: 15 minutes    [name] ANP  [facility] 7 am - 330 pm/M-F for direct patient care and secure chat Please refer to Amion for contact info 79  days

## 2021-04-01 NOTE — TOC Progression Note (Addendum)
Transition of Care [facility]) - Progression Note    Patient Details  Name: Carolyn Norton MRN: [id number] Date of Birth: 1965-09-19  [facility] CM/SW Contact  Janae Bridgeman, RN Phone Number: 04/01/2021, 10:49 AM  Clinical Narrative:    The patient remains under the care of the DTP Team for transitions of care needs.  The patient has no available bed offers at this time for [facility] placement.  Medicaid and disability are pending at this time.  Patient is awake and alert  today and may not qualify for admission to a hospice facility for care at this time.  I called and left a message with Jannett Celestine, RNCM with [facility] and clinicals were sent for medical review on Sunday, 03/30/2021.  CM asked that Gretta Cool, MSW supervisor assist with SNF placement since the patient continues to waiting on SNF offers and may not qualify for Inpatient Hospice facility at this time.  CM and MSW with DTP Team continue to follow the patient for transitions of care needs.  04/01/2021 1202 - I spoke with Gretta Cool, MSW supervisor and patient is in need of a DTP bed at a SNF facility since patient had no current bed offers at this time and most likely does not qualify for inpatient hospice facility presently.  The patient's clinicals were sent in the hub to [facility] to review for possible DTP bed availability for admission.  04/01/2021 1340 - CM spoke with Jannett Celestine, CM with [facility] and the Medical director at the facility is not able to offer an admission bed to the patient at this time.   Expected Discharge Plan: Hospice Medical Facility Barriers to Discharge: Inadequate or no insurance, Family Issues  Expected Discharge Plan and Services Expected Discharge Plan: Hospice Medical Facility In-house Referral: Clinical Social Work Discharge Planning Services: CM Consult Post Acute Care Choice: Skilled Nursing Facility Living  arrangements for the past 2 months: Single Family Home Expected Discharge Date: 01/16/21               DME Arranged: [name] rolling DME Agency: [employer] Date DME Agency Contacted: 01/20/21 Time DME Agency Contacted: 1435 Representative spoke with at DME Agency: [name]             Social Determinants of Health (SDOH) Interventions    Readmission Risk Interventions Readmission Risk Prevention Plan 01/14/2021  Transportation Screening Complete  PCP or Specialist Appt within 3-5 Days (No Data)  Social Work Consult for Recovery Care Planning/Counseling Complete  Palliative Care Screening Not Applicable  Medication Review [employer]) Referral to Pharmacy  Some recent data might be hidden

## 2021-04-01 NOTE — Plan of Care (Signed)

## 2021-04-01 NOTE — Progress Notes (Signed)
LTM EEG discontinued - no skin breakdown at unhook.   

## 2021-04-01 NOTE — Procedures (Addendum)
Patient Name: Carolyn Norton  MRN: [id number]  Epilepsy Attending: Charlsie Quest  Referring Physician/Provider: Dr Lindie Spruce Duration: 03/31/2021 1556 to 04/01/2021 1148   Patient history: 54yo F with ams, intermittent staring. EEG to evaluate for seizure.   Level of alertness: Awake, asleep   AEDs during EEG study: None   Technical aspects: This EEG study was done with scalp electrodes positioned according to the 10-20 International system of electrode placement. Electrical activity was acquired at a sampling rate of 500Hz  and reviewed with a high frequency filter of 70Hz  and a low frequency filter of 1Hz . EEG data were recorded continuously and digitally stored.   Description: No posterior dominant rhythm was seen. Sleep was characterized by sleep spindles (12 to 14 Hz), maximal frontocentral region.  EEG showed continuous generalized polymorphic sharply contoured 3 to 6 Hz theta-delta slowing, at times with triphasic morphology. Hyperventilation and photic stimulation were not performed.     Parts of study were difficult to review due to significant electrode artifact (patient was pulling electrodes)   ABNORMALITY - Continuous slow, generalized   IMPRESSION: This study is suggestive of moderate diffuse encephalopathy, nonspecific etiology but could be secondary to toxic-metabolic causes.  No seizures or definite epileptiform discharges were seen throughout the recording.   Jacere Pangborn 

## 2021-04-02 LAB — CBC WITH DIFFERENTIAL/PLATELET
Abs Immature Granulocytes: 0.06 10*3/uL (ref 0.00–0.07)
Basophils Absolute: 0 10*3/uL (ref 0.0–0.1)
Basophils Relative: 0 %
Eosinophils Absolute: 0.2 10*3/uL (ref 0.0–0.5)
Eosinophils Relative: 2 %
HCT: 35.9 % — ABNORMAL LOW (ref 36.0–46.0)
Hemoglobin: 11.9 g/dL — ABNORMAL LOW (ref 12.0–15.0)
Immature Granulocytes: 1 %
Lymphocytes Relative: 37 %
Lymphs Abs: 4.1 10*3/uL — ABNORMAL HIGH (ref 0.7–4.0)
MCH: 29.5 pg (ref 26.0–34.0)
MCHC: 33.1 g/dL (ref 30.0–36.0)
MCV: 88.9 fL (ref 80.0–100.0)
Monocytes Absolute: 1.1 10*3/uL — ABNORMAL HIGH (ref 0.1–1.0)
Monocytes Relative: 10 %
Neutro Abs: 5.5 10*3/uL (ref 1.7–7.7)
Neutrophils Relative %: 50 %
Platelets: 460 10*3/uL — ABNORMAL HIGH (ref 150–400)
RBC: 4.04 MIL/uL (ref 3.87–5.11)
RDW: 13.4 % (ref 11.5–15.5)
WBC: 11.1 10*3/uL — ABNORMAL HIGH (ref 4.0–10.5)
nRBC: 0 % (ref 0.0–0.2)

## 2021-04-02 LAB — COMPREHENSIVE METABOLIC PANEL
ALT: 11 U/L (ref 0–44)
AST: 11 U/L — ABNORMAL LOW (ref 15–41)
Albumin: 3 g/dL — ABNORMAL LOW (ref 3.5–5.0)
Alkaline Phosphatase: 63 U/L (ref 38–126)
Anion gap: 8 (ref 5–15)
BUN: 29 mg/dL — ABNORMAL HIGH (ref 6–20)
CO2: 25 mmol/L (ref 22–32)
Calcium: 9.6 mg/dL (ref 8.9–10.3)
Chloride: 101 mmol/L (ref 98–111)
Creatinine, Ser: 0.91 mg/dL (ref 0.44–1.00)
GFR, Estimated: >60 mL/min (ref >60)
Glucose, Bld: 261 mg/dL — ABNORMAL HIGH (ref 70–99)
Potassium: 3.6 mmol/L (ref 3.5–5.1)
Sodium: 134 mmol/L — ABNORMAL LOW (ref 135–145)
Total Bilirubin: 0.5 mg/dL (ref 0.3–1.2)
Total Protein: 6.7 g/dL (ref 6.5–8.1)

## 2021-04-02 LAB — GLUCOSE, CAPILLARY
Glucose-Capillary: 298 mg/dL — ABNORMAL HIGH (ref 70–99)
Glucose-Capillary: 454 mg/dL — ABNORMAL HIGH (ref 70–99)
Glucose-Capillary: 282 mg/dL — ABNORMAL HIGH (ref 70–99)
Glucose-Capillary: 341 mg/dL — ABNORMAL HIGH (ref 70–99)
Glucose-Capillary: 132 mg/dL — ABNORMAL HIGH (ref 70–99)

## 2021-04-02 LAB — MAGNESIUM: Magnesium: 1.7 mg/dL (ref 1.7–2.4)

## 2021-04-02 LAB — C-REACTIVE PROTEIN: CRP: 0.6 mg/dL (ref <1.0)

## 2021-04-02 MED ORDER — OLANZAPINE 5 MG PO TBDP
2.5000 mg | ORAL_TABLET | Freq: Every day | ORAL | Status: DC
  Administered 2021-04-02 – 2021-04-09 (×8): 2.5 mg via ORAL
  Filled 2021-04-02 (×8): qty 0.5

## 2021-04-02 MED ORDER — INSULIN GLARGINE-YFGN 100 UNIT/ML ~~LOC~~ SOLN
7.0000 [IU] | Freq: Once | SUBCUTANEOUS | Status: AC
  Administered 2021-04-02: 7 [IU] via SUBCUTANEOUS
  Filled 2021-04-02: qty 0.07

## 2021-04-02 MED ORDER — INSULIN ASPART 100 UNIT/ML IJ SOLN
9.0000 [IU] | Freq: Once | INTRAMUSCULAR | Status: AC
  Administered 2021-04-02: 9 [IU] via SUBCUTANEOUS

## 2021-04-02 MED ORDER — INSULIN ASPART 100 UNIT/ML IJ SOLN
0.0000 [IU] | INTRAMUSCULAR | Status: DC
  Administered 2021-04-02: 11 [IU] via SUBCUTANEOUS
  Administered 2021-04-02: 8 [IU] via SUBCUTANEOUS
  Administered 2021-04-03: 5 [IU] via SUBCUTANEOUS
  Administered 2021-04-03 (×3): 2 [IU] via SUBCUTANEOUS
  Administered 2021-04-03: 8 [IU] via SUBCUTANEOUS
  Administered 2021-04-03: 11 [IU] via SUBCUTANEOUS
  Administered 2021-04-04: 3 [IU] via SUBCUTANEOUS
  Administered 2021-04-04: 11 [IU] via SUBCUTANEOUS
  Administered 2021-04-04 (×2): 8 [IU] via SUBCUTANEOUS
  Administered 2021-04-04: 6 [IU] via SUBCUTANEOUS
  Administered 2021-04-04 – 2021-04-05 (×2): 3 [IU] via SUBCUTANEOUS
  Administered 2021-04-05: 2 [IU] via SUBCUTANEOUS
  Administered 2021-04-05: 15 [IU] via SUBCUTANEOUS
  Administered 2021-04-05: 11 [IU] via SUBCUTANEOUS
  Administered 2021-04-05: 3 [IU] via SUBCUTANEOUS
  Administered 2021-04-05: 2 [IU] via SUBCUTANEOUS
  Administered 2021-04-06: 15 [IU] via SUBCUTANEOUS
  Administered 2021-04-06: 10 [IU] via SUBCUTANEOUS
  Administered 2021-04-06: 2 [IU] via SUBCUTANEOUS
  Administered 2021-04-06: 8 [IU] via SUBCUTANEOUS
  Administered 2021-04-07: 15 [IU] via SUBCUTANEOUS
  Administered 2021-04-07: 3 [IU] via SUBCUTANEOUS
  Administered 2021-04-07: 5 [IU] via SUBCUTANEOUS

## 2021-04-02 MED ORDER — INSULIN GLARGINE 100 UNIT/ML ~~LOC~~ SOLN
12.0000 [IU] | Freq: Every day | SUBCUTANEOUS | Status: DC
  Administered 2021-04-03 – 2021-04-06 (×4): 12 [IU] via SUBCUTANEOUS
  Filled 2021-04-02 (×5): qty 0.12

## 2021-04-02 NOTE — Progress Notes (Addendum)
[facility] PROGRESS NOTE  Carolyn Norton CNO:[id number] DOB: 1966-06-05 DOA: 01/11/2021 PCP: Carolyn Lodge, FNP            March 27, 2021     Status: Remains inpatient appropriate because:Unsafe d/c plan-due to patient's underlying poor mental status short-term memory deficits she will require 24/7 care after discharge  Dispo:  Patient From:  Home  Planned Disposition: SNF versus residential hospice  Medically stable for discharge:  yes  Barriers to DC: Medicaid still pending; prior history of substance abuse and underlying documented bipolar disorder             Difficult to place: Yes  Level of care: Med-Surg  Code Status: DNR Family Communication: Father 7/22 updated regarding likelihood Long COVID neurological syndrome contributing to rapid decline DVT prophylaxis: Lovenox COVID vaccination status: Unknown-post COVID infection 09/06/20   HPI: 55 year old female past medical history of diabetes mellitus type 2, bipolar disorder type I, previous COVID infection, polysubstance abuse that includes cocaine.  She presented from home where she lives with her parents.  Her presenting symptoms included generalized weakness, multiple falls and confusion.  Family confirmed that she had been having the symptoms for at least a month with last use of crack cocaine 1 month prior to presentation.  Ever since that time she had not been doing well.  She would also be having issues with hyperglycemia.  In the ER she was diagnosed with severe dehydration, hyperglycemia and metabolic encephalopathy.  Since admission patient has been evaluated by the psychiatric team who has deemed her alert and oriented and having capacity to make decisions.  Patient is agreeable to placement at a rehab.  Currently she has severe physical deconditioning which was POA and PT is recommending SNF.  Because of her history of polysubstance abuse been difficult to obtain an SNF bed.  Initially her  family is agreeable to taking her back in home when she is able to ambulate well enough to get back and forth for meals into the bathroom.  Unfortunately her cognitive and physical status has progressively declined during this admission.  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  Palliative medicine is continuing to follow along.  Patient's previous advanced directives stated she did not want a PEG tube if she were unable to eat.  Palliative medicine has had multiple meetings with the family on 7/15 decision was made to place cortrack tube for short-term feedings to see if patient demonstrates any improvement in symptoms.   Unfortunately no improvement. Cortrak dc'd. Famliy not ready for comfort measures. Concerned that [name] not eating due to dental pain. Multiple evaluations including imaging w/o abscess. Suspect has cavities which cannot be addressed in the IP setting. It is unlikely this is influencing her intake since her primary complaint for many weeks has been that she is not hungry.  Subjective: Today is back to typical baseline mentation and interaction.  Extremely flat affect but will smile intermittently.  Responds best to simple questions with one-word responses.  Not following commands today.  Objective: Vitals:   04/01/21 2047 04/02/21 0300  BP: (!) 144/74 133/74  Pulse: 81 81  Resp: 16 16  Temp: 98.7 F (37.1 C) 98.7 F (37.1 C)  SpO2: 97% 97%    Intake/Output Summary (Last 24 hours) at 04/02/2021 0755 Last data filed at 04/02/2021 0322 Gross per 24 hour  Intake --  Output 951 ml  Net -951 ml    Filed Weights   03/28/21 0355 03/30/21 0335 04/01/21 0736  Weight: 70.7 kg 69.3 kg 70.6 kg    Exam:  Constitutional: Awake, no acute distress, appears to be comfortable Respiratory: Anterior lung sounds clear to auscultation, stable on room air, oximetry 97% Cardiac: Normotensive,  regular rhythm with nontachycardic rate, normal heart sounds Abdomen: LBM 7/27, soft nontender nondistended, needs with poor intake of about 20% per meal Neurologic: Cranial nerves appear grossly intact, sensation intact all extremities.  No spontaneous movement of extremities  Psychiatric: Awake with extremely flat affect.  Will respond with simple 1 word phrases but remains oriented to name only.  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  Initially felt to be partially due to NPH physiology given some improved after initial large-volume LP procedure.  Unfortunately repeat large-volume LP without similar results.  MRI of her spine without any abnormalities to explain her progressive lower extremity weakness. Neurology suspects chronic progressive neurological decline secondary to longstanding drug abuse and it is also suspected that Long COVID neurological syndrome contributing.  Unfortunately there are are no known treatments available at this time to treat effects of long COVID neurological syndrome.  Not have acute encephalitis on her MRI or other reversible/inflammatory treatable causes to neurological changes after COVID. Repeat EEG 7/25 with moderate diffuse encephalopathy no evidence of seizures or other abnormality. MRI unchanged from prior in May  Dysphagia/failure to thrive secondary to multifactorial neurological condition Appetite did not improve with Megace and this was discontinued secondary to significant hyperglycemia Did not improve after 10 days of enteral feedings.  Family decided to honor patient's wishes and had not pursued PEG tube that are not yet ready for comfort care  Acute hypoxemic  respiratory failure with underlying COPD Stable on room air Continue duo nebs to prn  Diabetes mellitus 2 with hyperglycemia on insulin Preadmission hemoglobin A1c greater than 11 CBGs over the past 24 hours have ranged from a low of 165 to a peak of 557 A.m. dose of Lantus already given so we will give additional Lantus to total 12 units which will be administered daily 7/28 (Semglee substituted by pharmacy starting this week) Increase SSI to moderate  Elevated blood pressure/New dx HTN No prior documented history of hypertension  Continue low-dose Norvasc  Profound physical deconditioning/critical illness myopathy Likely a combination of persistent altered mental status and inability to participate consistently with PT as well as myopathy of critical illness in association with long COVID neurological syndrome  History of bipolar 1 disorder/polysubstance abuse/cognitive impairment Continue to hold home escitalopram, Depakote and Neurontin secondary to persistent altered mentation and bipolar symptoms not present  Ventriculomegaly/? NPH NPH ruled out as above  Dyslipidemia Hold statin until oral intake improves  Physical deconditioning/bilateral heel cord shortening Continue bilateral PRAFO boots on every 4 hours and off every 4 hours Given progressive neurological decline unable to participate with therapies    Other problems: GERD Oral PPI on hold while n.p.o.  Hypokalemia/hypomagnesemia Resolved  E. Coli [name] UTI Treated Use purewick only at bedtime    Data Reviewed: Basic Metabolic Panel: Recent Labs  Lab 03/31/21 0349 04/01/21 0128 04/02/21 0143  NA 138 137 134*  K 3.3* 3.6 3.6  CL 102 103 101  CO2 26 26 25   GLUCOSE 57* 182*  261*  BUN 22* 27* 29*  CREATININE 0.79 0.89 0.91  CALCIUM 9.8 9.9 9.6  MG 1.8 1.8 1.7  PHOS 4.3  --   --    Liver Function Tests: Recent Labs  Lab 03/31/21 0349 04/01/21 0128 04/02/21 0143  AST 14* 12* 11*  ALT 10  12 11   ALKPHOS 56 58 63  BILITOT 0.7 0.7 0.5  PROT 6.4* 6.7 6.7  ALBUMIN 3.1* 2.9* 3.0*    No results for input(s): LIPASE, AMYLASE in the last 168 hours. No results for input(s): AMMONIA in the last 168 hours. CBC: Recent Labs  Lab 03/31/21 0349 04/01/21 0128 04/02/21 0143  WBC 9.5 10.1 11.1*  NEUTROABS 4.6 4.5 5.5  HGB 12.0 11.8* 11.9*  HCT 35.8* 35.7* 35.9*  MCV 88.2 89.3 88.9  PLT 278 449* 460*   Cardiac Enzymes: No results for input(s): CKTOTAL, CKMB, CKMBINDEX, TROPONINI in the last 168 hours. BNP (last 3 results) Recent Labs    03/24/21 0856 03/25/21 0203 03/26/21 0222  BNP 29.2 33.9 37.9    ProBNP (last 3 results) No results for input(s): PROBNP in the last 8760 hours.  CBG: Recent Labs  Lab 04/01/21 1152 04/01/21 1643 04/01/21 2043 04/01/21 2323 04/02/21 0315  GLUCAP 376* 348* 557* 307* 298*    No results found for this or any previous visit (from the past 240 hour(s)).     Studies: MR BRAIN W WO CONTRAST  Result Date: 04/01/2021 CLINICAL DATA:  Mental status change, persistent or worsening EXAM: MRI HEAD WITHOUT AND WITH CONTRAST TECHNIQUE: Multiplanar, multiecho pulse sequences of the brain and surrounding structures were obtained without and with intravenous contrast. CONTRAST:  7mL GADAVIST GADOBUTROL 1 MMOL/ML IV SOLN COMPARISON:  Feb 02, 2021 FINDINGS: Brain: There is no acute infarction or intracranial hemorrhage. There is no intracranial mass, mass effect, or edema. There is no hydrocephalus or extra-axial fluid collection. Prominence of the ventricles and sulci with similar ventriculomegaly. Patchy and confluent areas of T2 hyperintensity in the supratentorial white matter are nonspecific but may reflect similar chronic microvascular ischemic changes. There is no abnormal enhancement. Vascular: Major vessel flow voids at the skull base are preserved. Skull and upper cervical spine: Normal marrow signal is preserved. Sinuses/Orbits: Paranasal  sinuses are aerated. Orbits are unremarkable. Other: Sella is unremarkable.  Patchy mastoid fluid opacification. IMPRESSION: No acute intracranial abnormality. No significant change since 02/02/2021 including stable ventricular enlargement that could reflect central volume loss or communicating/normal pressure hydrocephalus. Electronically Signed   By: Guadlupe SpanishPraneil  Patel M.D.   On: 04/01/2021 16:37   EEG adult  Result Date: 03/31/2021 Charlsie QuestYadav, Priyanka O, MD     03/31/2021  5:51 PM Patient Name: Carolyn Norton MRN: [id number] Epilepsy Attending: Charlsie QuestPriyanka O Yadav Referring Physician/Provider: Junious SilkAllison Yang Rack, NP Date: 03/31/2021 Duration: 25.32 mins Patient history: 54yo F with ams, intermittent staring. EEG to evaluate for seizure. Level of alertness: Awake AEDs during EEG study: None Technical aspects: This EEG study was done with scalp electrodes positioned according to the 10-20 International system of electrode placement. Electrical activity was acquired at a sampling rate of 500Hz  and reviewed with a high frequency filter of 70Hz  and a low frequency filter of 1Hz . EEG data were recorded continuously and digitally stored. Description: No posterior dominant rhythm was seen. EEG showed continuous generalized polymorphic sharply contoured 3 to 6 Hz theta-delta slowing. Hyperventilation and photic stimulation were not performed.   ABNORMALITY - Continuous slow, generalized IMPRESSION: This study is suggestive of moderate diffuse encephalopathy, nonspecific etiology.  No seizures or definite epileptiform discharges were seen throughout the recording. Priyanka Annabelle Harman   Overnight EEG with video  Result Date: 04/01/2021 Charlsie Quest, MD     04/01/2021  1:45 PM Patient Name: Carolyn Norton MRN: [id number] Epilepsy Attending: Charlsie Quest Referring Physician/Provider: Dr Lindie Spruce Duration: 03/31/2021 1556 to 04/01/2021 1148  Patient history: 54yo F with ams, intermittent staring. EEG to evaluate for  seizure.  Level of alertness: Awake, asleep  AEDs during EEG study: None  Technical aspects: This EEG study was done with scalp electrodes positioned according to the 10-20 International system of electrode placement. Electrical activity was acquired at a sampling rate of 500Hz  and reviewed with a high frequency filter of 70Hz  and a low frequency filter of 1Hz . EEG data were recorded continuously and digitally stored.  Description: No posterior dominant rhythm was seen. Sleep was characterized by sleep spindles (12 to 14 Hz), maximal frontocentral region.  EEG showed continuous generalized polymorphic sharply contoured 3 to 6 Hz theta-delta slowing, at times with triphasic morphology. Hyperventilation and photic stimulation were not performed.   Parts of study were difficult to review due to significant electrode artifact (patient was pulling electrodes)  ABNORMALITY - Continuous slow, generalized  IMPRESSION: This study is suggestive of moderate diffuse encephalopathy, nonspecific etiology but could be secondary to toxic-metabolic causes.  No seizures or definite epileptiform discharges were seen throughout the recording.  Priyanka    Scheduled Meds:  (feeding supplement) PROSource Plus  30 mL Oral TID BM   amLODipine  5 mg Oral Daily   Benzocaine   Mouth/Throat BID   enoxaparin (LOVENOX) injection  40 mg Subcutaneous Q24H   insulin aspart  0-6 Units Subcutaneous Q4H   insulin glargine  5 Units Subcutaneous Daily   metoprolol tartrate  50 mg Oral BID   multivitamin  15 mL Oral Daily   Continuous Infusions:      Principal Problem:   Bipolar 1 disorder, depressed, full remission (HCC) Active Problems:   Bipolar 1 disorder (HCC)   Hypertension   Generalized weakness   Controlled type 2 diabetes mellitus with hyperglycemia, with long-term current use of insulin (HCC)   Hyperlipidemia   NPH (normal pressure hydrocephalus) (HCC)   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   Protein-calorie  malnutrition, severe   Consultants: Psychiatry Neurology Neurosurgery  Procedures: None  Antibiotics: Anti-infectives (From admission, onward)    Start     Dose/Rate Route Frequency Ordered Stop   03/06/21 0900  ceFAZolin (ANCEF) IVPB 1 g/50 mL premix        1 g 100 mL/hr over 30 Minutes Intravenous Every 8 hours 03/06/21 0801 03/10/21 0559   03/04/21 1430  cefTRIAXone (ROCEPHIN) 1 g in sodium chloride 0.9 % 100 mL IVPB  Status:  Discontinued        1 g 200 mL/hr over 30 Minutes Intravenous Every 24 hours 03/04/21 1335 03/06/21 0800   02/14/21 0828  vancomycin (VANCOREADY) IVPB 1000 mg/200 mL  Status:  Discontinued        1,000 mg 200 mL/hr over 60 Minutes Intravenous 60 min pre-op 02/14/21 0828 03/02/21 1139   02/13/21 2115  cefTRIAXone (ROCEPHIN) 1 g in sodium chloride 0.9 % 100 mL IVPB  Status:  Discontinued        1 g 200 mL/hr over 30 Minutes Intravenous Every 24 hours 02/13/21 2016 02/16/21 0844        Time spent: 15 minutes    2116 ANP  [facility] 7 am - 330 pm/M-F for direct patient care and secure chat Please refer to Amion for contact info 80  days

## 2021-04-02 NOTE — Plan of Care (Signed)

## 2021-04-02 NOTE — TOC Progression Note (Signed)
Transition of Care [facility]) - Progression Note    Patient Details  Name: Kailly Richoux MRN: [id number] Date of Birth: 1966-03-27  [facility]) CM/SW Contact  Janae Bridgeman, RN Phone Number: 04/02/2021, 8:00 AM  Clinical Narrative:    CM spoke with Georgeann Oppenheim, CM at [facility] and she is reviewing the patient's clinicals today - resent clinicals in the hub.   Expected Discharge Plan: Hospice Medical Facility Barriers to Discharge: Inadequate or no insurance, Family Issues  Expected Discharge Plan and Services Expected Discharge Plan: Hospice Medical Facility In-house Referral: Clinical Social Work Discharge Planning Services: CM Consult Post Acute Care Choice: Skilled Nursing Facility Living arrangements for the past 2 months: Single Family Home Expected Discharge Date: 01/16/21               DME Arranged: [name] rolling DME Agency: [employer] Date DME Agency Contacted: 01/20/21 Time DME Agency Contacted: 1435 Representative spoke with at DME Agency: [name]             Social Determinants of Health (SDOH) Interventions    Readmission Risk Interventions Readmission Risk Prevention Plan 01/14/2021  Transportation Screening Complete  PCP or Specialist Appt within 3-5 Days (No Data)  Social Work Consult for Recovery Care Planning/Counseling Complete  Palliative Care Screening Not Applicable  Medication Review Ocean[facility]) Referral to Pharmacy  Some recent data might be hidden

## 2021-04-03 LAB — COMPREHENSIVE METABOLIC PANEL
ALT: 11 U/L (ref 0–44)
AST: 9 U/L — ABNORMAL LOW (ref 15–41)
Albumin: 3.1 g/dL — ABNORMAL LOW (ref 3.5–5.0)
Alkaline Phosphatase: 57 U/L (ref 38–126)
Anion gap: 9 (ref 5–15)
BUN: 28 mg/dL — ABNORMAL HIGH (ref 6–20)
CO2: 27 mmol/L (ref 22–32)
Calcium: 9.8 mg/dL (ref 8.9–10.3)
Chloride: 100 mmol/L (ref 98–111)
Creatinine, Ser: 0.84 mg/dL (ref 0.44–1.00)
GFR, Estimated: >60 mL/min (ref >60)
Glucose, Bld: 115 mg/dL — ABNORMAL HIGH (ref 70–99)
Potassium: 3.4 mmol/L — ABNORMAL LOW (ref 3.5–5.1)
Sodium: 136 mmol/L (ref 135–145)
Total Bilirubin: 0.4 mg/dL (ref 0.3–1.2)
Total Protein: 6.5 g/dL (ref 6.5–8.1)

## 2021-04-03 LAB — CBC WITH DIFFERENTIAL/PLATELET
Abs Immature Granulocytes: 0.04 10*3/uL (ref 0.00–0.07)
Basophils Absolute: 0.1 10*3/uL (ref 0.0–0.1)
Basophils Relative: 1 %
Eosinophils Absolute: 0.3 10*3/uL (ref 0.0–0.5)
Eosinophils Relative: 2 %
HCT: 38.1 % (ref 36.0–46.0)
Hemoglobin: 12.9 g/dL (ref 12.0–15.0)
Immature Granulocytes: 0 %
Lymphocytes Relative: 34 %
Lymphs Abs: 4.5 10*3/uL — ABNORMAL HIGH (ref 0.7–4.0)
MCH: 29.9 pg (ref 26.0–34.0)
MCHC: 33.9 g/dL (ref 30.0–36.0)
MCV: 88.4 fL (ref 80.0–100.0)
Monocytes Absolute: 1.1 10*3/uL — ABNORMAL HIGH (ref 0.1–1.0)
Monocytes Relative: 8 %
Neutro Abs: 7.3 10*3/uL (ref 1.7–7.7)
Neutrophils Relative %: 55 %
Platelets: 398 10*3/uL (ref 150–400)
RBC: 4.31 MIL/uL (ref 3.87–5.11)
RDW: 13.4 % (ref 11.5–15.5)
WBC: 13.2 10*3/uL — ABNORMAL HIGH (ref 4.0–10.5)
nRBC: 0 % (ref 0.0–0.2)

## 2021-04-03 LAB — GLUCOSE, CAPILLARY
Glucose-Capillary: 220 mg/dL — ABNORMAL HIGH (ref 70–99)
Glucose-Capillary: 149 mg/dL — ABNORMAL HIGH (ref 70–99)
Glucose-Capillary: 251 mg/dL — ABNORMAL HIGH (ref 70–99)
Glucose-Capillary: 142 mg/dL — ABNORMAL HIGH (ref 70–99)
Glucose-Capillary: 335 mg/dL — ABNORMAL HIGH (ref 70–99)
Glucose-Capillary: 263 mg/dL — ABNORMAL HIGH (ref 70–99)

## 2021-04-03 LAB — C-REACTIVE PROTEIN: CRP: 0.6 mg/dL (ref <1.0)

## 2021-04-03 LAB — MAGNESIUM: Magnesium: 1.8 mg/dL (ref 1.7–2.4)

## 2021-04-03 MED ORDER — GLUCERNA SHAKE PO LIQD
237.0000 mL | Freq: Three times a day (TID) | ORAL | Status: DC
  Administered 2021-04-03 – 2021-04-29 (×65): 237 mL via ORAL
  Filled 2021-04-03 (×9): qty 237

## 2021-04-03 NOTE — Progress Notes (Signed)
[facility] PROGRESS NOTE  Carolyn Norton AYT:[id number] DOB: [birth year] DOA: 01/11/2021 PCP: [name], FNP            March 27, 2021     Status: Remains inpatient appropriate because:Unsafe d/c plan-due to patient's underlying poor mental status short-term memory deficits she will require 24/7 care after discharge  Dispo:  Patient From:  Home  Planned Disposition: SNF versus residential hospice  Medically stable for discharge:  yes  Barriers to DC: Medicaid still pending; prior history of substance abuse and underlying documented bipolar disorder             Difficult to place: Yes  Level of care: Med-Surg  Code Status: DNR Family Communication: Father 7/22 updated regarding likelihood Long COVID neurological syndrome contributing to rapid decline DVT prophylaxis: Lovenox COVID vaccination status: Unknown-post COVID infection 09/06/20   HPI: 55 year old female past medical history of diabetes mellitus type 2, bipolar disorder type I, previous COVID infection, polysubstance abuse that includes cocaine.  She presented from home where she lives with her parents.  Her presenting symptoms included generalized weakness, multiple falls and confusion.  Family confirmed that she had been having the symptoms for at least a month with last use of crack cocaine 1 month prior to presentation.  Ever since that time she had not been doing well.  She would also be having issues with hyperglycemia.  In the ER she was diagnosed with severe dehydration, hyperglycemia and metabolic encephalopathy.  Since admission patient has been evaluated by the psychiatric team who has deemed her alert and oriented and having capacity to make decisions.  Patient is agreeable to placement at a rehab.  Currently she has severe physical deconditioning which was POA and PT is recommending SNF.  Because of her history of polysubstance abuse been difficult to obtain an SNF bed.  Initially her  family is agreeable to taking her back in home when she is able to ambulate well enough to get back and forth for meals into the bathroom.  Unfortunately her cognitive and physical status has progressively declined during this admission.  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  Palliative medicine is continuing to follow along.  Patient's previous advanced directives stated she did not want a PEG tube if she were unable to eat.  Palliative medicine has had multiple meetings with the family on 7/15 decision was made to place cortrack tube for short-term feedings to see if patient demonstrates any improvement in symptoms.   Unfortunately no improvement. Cortrak dc'd. Famliy not ready for comfort measures. Concerned that [place] not eating due to dental pain. Multiple evaluations including imaging w/o abscess. Suspect has cavities which cannot be addressed in the IP setting. It is unlikely this is influencing her intake since her primary complaint for many weeks has been that she is not hungry.  Subjective: Awake.  Smiling.  Nursing staff at bedside feeding patient.  Able to briefly follow simple commands although when asked to hold up left leg she held up her left leg but never put it back down automatically and had to be told to put her leg down.  She was able to squeeze with both hands very weakly.  Objective: Vitals:   04/02/21 2130 04/03/21 0300  BP: 134/84 119/80  Pulse: 91 84  Resp: 16 16  Temp: 97.6 F (36.4 C) 97.6 F (36.4 C)  SpO2: 99% 99%  Intake/Output Summary (Last 24 hours) at 04/03/2021 0741 Last data filed at 04/02/2021 2300 Gross per 24 hour  Intake 177 ml  Output 900 ml  Net -723 ml    Filed Weights   03/30/21 0335 04/01/21 0736 04/03/21 0500  Weight: 69.3 kg 70.6 kg 68.5 kg    Exam: Constitutional: Calm and in no acute physical distress Respiratory: Anterior lung sounds remain  clear, stable on room air, no increased work of breathing, no wheezing Cardiac: S1-S2, no peripheral edema, normotensive Abdomen: LBM 7/27, soft and nontender.  In the past 24 hours her intake has improved slightly up to 60% Neurologic: On visual inspection cranial nerves are grossly intact, has extremely weak but symmetrical movement with strength estimated about 2/5 both upper and lower extremities.  No focal neurological deficits appreciated Psychiatric: Awake and oriented to self only.  Inconsistently follows commands.  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  Initially felt to be partially due to NPH physiology given some improved after initial large-volume LP procedure.  Unfortunately repeat large-volume LP without similar results.  MRI of her spine without any abnormalities to explain her progressive lower extremity weakness. Neurology suspects chronic progressive neurological decline secondary to longstanding drug abuse and it is also suspected that Long COVID neurological syndrome contributing.  Unfortunately there are are no known treatments available at this time to treat effects of long COVID neurological syndrome.  Not have acute encephalitis on her MRI or other reversible/inflammatory treatable causes to neurological changes after COVID. Repeat EEG 7/25 with moderate diffuse encephalopathy no evidence of seizures or other abnormality. MRI unchanged from prior in May  Dysphagia/failure to thrive secondary to multifactorial neurological condition Megace unsuccessful at improving appetite and was discontinued secondary due to hyperglycemia Did not improve after 10 days of enteral feedings.  Family  decided to honor patient's wishes and had not pursued PEG tube that are not yet ready for comfort care 7/27 Palliative care has added low-dose Zyprexa at bedtime to see if this will improve her appetite  Acute hypoxemic respiratory failure with underlying COPD Stable on room air Continue duo nebs to prn  Diabetes mellitus 2 with hyperglycemia on insulin Preadmission hemoglobin A1c greater than 11 CBGs have improved with a range of 1 32-251 with increase in Lantus dose Continue Semglee/Lantus 12 units daily Increase SSI to moderate  Elevated blood pressure/New dx HTN No prior documented history of hypertension  Continue low-dose Norvasc  Profound physical deconditioning/critical illness myopathy Likely a combination of persistent altered mental status and inability to participate consistently with PT as well as myopathy of critical illness in association with long COVID neurological syndrome  History of bipolar 1 disorder/polysubstance abuse/cognitive impairment Continue to hold home escitalopram, Depakote and Neurontin secondary to persistent altered mentation and bipolar symptoms not present  Ventriculomegaly/? NPH NPH ruled out as above  Dyslipidemia Hold statin until oral intake improves  Physical deconditioning/bilateral heel cord shortening Continue bilateral PRAFO boots on every 4 hours and off every 4 hours Given progressive neurological decline unable to participate with therapies    Other problems: GERD Oral PPI on hold while n.p.o.  Hypokalemia/hypomagnesemia Resolved  E. Coli [name]/Klebsiella UTI Treated Use purewick only at bedtime    Data Reviewed: Basic Metabolic Panel: Recent Labs  Lab 03/31/21 0349 04/01/21 0128 04/02/21 0143 04/03/21 0141  NA 138 137 134* 136  K 3.3* 3.6 3.6 3.4*  CL 102 103 101 100  CO2 26 26 25 27   GLUCOSE 57* 182* 261* 115*  BUN 22* 27* 29* 28*  CREATININE 0.79 0.89 0.91 0.84  CALCIUM 9.8 9.9 9.6 9.8  MG 1.8 1.8 1.7 1.8   PHOS 4.3  --   --   --    Liver Function Tests: Recent Labs  Lab 03/31/21 0349 04/01/21 0128 04/02/21 0143 04/03/21 0141  AST 14* 12* 11* 9*  ALT 10 12 11 11   ALKPHOS 56 58 63 57  BILITOT 0.7 0.7 0.5 0.4  PROT 6.4* 6.7 6.7 6.5  ALBUMIN 3.1* 2.9* 3.0* 3.1*    No results for input(s): LIPASE, AMYLASE in the last 168 hours. No results for input(s): AMMONIA in the last 168 hours. CBC: Recent Labs  Lab 03/31/21 0349 04/01/21 0128 04/02/21 0143 04/03/21 0141  WBC 9.5 10.1 11.1* 13.2*  NEUTROABS 4.6 4.5 5.5 7.3  HGB 12.0 11.8* 11.9* 12.9  HCT 35.8* 35.7* 35.9* 38.1  MCV 88.2 89.3 88.9 88.4  PLT 278 449* 460* 398   Cardiac Enzymes: No results for input(s): CKTOTAL, CKMB, CKMBINDEX, TROPONINI in the last 168 hours. BNP (last 3 results) Recent Labs    03/24/21 0856 03/25/21 0203 03/26/21 0222  BNP 29.2 33.9 37.9    ProBNP (last 3 results) No results for input(s): PROBNP in the last 8760 hours.  CBG: Recent Labs  Lab 04/02/21 1224 04/02/21 1636 04/02/21 2005 04/02/21 2318 04/03/21 0507  GLUCAP 454* 282* 341* 132* 220*    No results found for this or any previous visit (from the past 240 hour(s)).     Studies: MR BRAIN W WO CONTRAST  Result Date: 04/01/2021 CLINICAL DATA:  Mental status change, persistent or worsening EXAM: MRI HEAD WITHOUT AND WITH CONTRAST TECHNIQUE: Multiplanar, multiecho pulse sequences of the brain and surrounding structures were obtained without and with intravenous contrast. CONTRAST:  69mL GADAVIST GADOBUTROL 1 MMOL/ML IV SOLN COMPARISON:  Feb 02, 2021 FINDINGS: Brain: There is no acute infarction or intracranial hemorrhage. There is no intracranial mass, mass effect, or edema. There is no hydrocephalus or extra-axial fluid collection. Prominence of the ventricles and sulci with similar ventriculomegaly. Patchy and confluent areas of T2 hyperintensity in the supratentorial white matter are nonspecific but may reflect similar chronic  microvascular ischemic changes. There is no abnormal enhancement. Vascular: Major vessel flow voids at the skull base are preserved. Skull and upper cervical spine: Normal marrow signal is preserved. Sinuses/Orbits: Paranasal sinuses are aerated. Orbits are unremarkable. Other: Sella is unremarkable.  Patchy mastoid fluid opacification. IMPRESSION: No acute intracranial abnormality. No significant change since 02/02/2021 including stable ventricular enlargement that could reflect central volume loss or communicating/normal pressure hydrocephalus. Electronically Signed   By: Feb 04, 2021 M.D.   On: 04/01/2021 16:37   Overnight EEG with video  Result Date: 04/01/2021 04/03/2021, MD     04/01/2021  1:45 PM Patient Name: Carolyn Norton MRN: 04/03/2021 Epilepsy Attending: Lucilla Lame Referring Physician/Provider: Dr [id number] Duration: 03/31/2021 1556 to 04/01/2021 1148  Patient history: 54yo F with ams, intermittent staring. EEG to evaluate for seizure.  Level of alertness: Awake, asleep  AEDs during EEG study: None  Technical aspects: This EEG study was done with scalp electrodes positioned according to the 10-20 International system of electrode placement. Electrical activity was acquired at a sampling rate of 500Hz  and reviewed with a high frequency filter of 70Hz  and  a low frequency filter of 1Hz . EEG data were recorded continuously and digitally stored.  Description: No posterior dominant rhythm was seen. Sleep was characterized by sleep spindles (12 to 14 Hz), maximal frontocentral region.  EEG showed continuous generalized polymorphic sharply contoured 3 to 6 Hz theta-delta slowing, at times with triphasic morphology. Hyperventilation and photic stimulation were not performed.   Parts of study were difficult to review due to significant electrode artifact (patient was pulling electrodes)  ABNORMALITY - Continuous slow, generalized  IMPRESSION: This study is suggestive of moderate diffuse  encephalopathy, nonspecific etiology but could be secondary to toxic-metabolic causes.  No seizures or definite epileptiform discharges were seen throughout the recording.  Priyanka    Scheduled Meds:  (feeding supplement) PROSource Plus  30 mL Oral TID BM   amLODipine  5 mg Oral Daily   Benzocaine   Mouth/Throat BID   enoxaparin (LOVENOX) injection  40 mg Subcutaneous Q24H   insulin aspart  0-15 Units Subcutaneous Q4H   insulin glargine  12 Units Subcutaneous Daily   metoprolol tartrate  50 mg Oral BID   multivitamin  15 mL Oral Daily   OLANZapine zydis  2.5 mg Oral QHS   Continuous Infusions:      Principal Problem:   Bipolar 1 disorder, depressed, full remission (HCC) Active Problems:   Bipolar 1 disorder (HCC)   Hypertension   Generalized weakness   Controlled type 2 diabetes mellitus with hyperglycemia, with long-term current use of insulin (HCC)   Hyperlipidemia   NPH (normal pressure hydrocephalus) (HCC)   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   Protein-calorie malnutrition, severe   Consultants: Psychiatry Neurology Neurosurgery  Procedures: None  Antibiotics: Anti-infectives (From admission, onward)    Start     Dose/Rate Route Frequency Ordered Stop   03/06/21 0900  ceFAZolin (ANCEF) IVPB 1 g/50 mL premix        1 g 100 mL/hr over 30 Minutes Intravenous Every 8 hours 03/06/21 0801 03/10/21 0559   03/04/21 1430  cefTRIAXone (ROCEPHIN) 1 g in sodium chloride 0.9 % 100 mL IVPB  Status:  Discontinued        1 g 200 mL/hr over 30 Minutes Intravenous Every 24 hours 03/04/21 1335 03/06/21 0800   02/14/21 0828  vancomycin (VANCOREADY) IVPB 1000 mg/200 mL  Status:  Discontinued        1,000 mg 200 mL/hr over 60 Minutes Intravenous 60 min pre-op 02/14/21 0828 03/02/21 1139   02/13/21 2115  cefTRIAXone (ROCEPHIN) 1 g in sodium chloride 0.9 % 100 mL IVPB  Status:  Discontinued        1 g 200 mL/hr over 30 Minutes Intravenous Every 24 hours 02/13/21 2016 02/16/21  0844        Time spent: 15 minutes    04/18/21 ANP  [facility] 7 am - 330 pm/M-F for direct patient care and secure chat Please refer to Amion for contact info 81  days

## 2021-04-03 NOTE — TOC Progression Note (Signed)
Transition of Care [facility]) - Progression Note    Patient Details  Name: Carolyn Norton MRN: [id number] Date of Birth: 1966/08/25  [facility]) CM/SW Contact  Janae Bridgeman, RN Phone Number: 04/03/2021, 11:34 AM  Clinical Narrative:    Case management called and left a message with Georgeann Oppenheim, CM at [facility] and she is currently review the patient's clinicals at this time for possible SNF placement for DTP Bed.  I'm waiting on a return call.   Expected Discharge Plan: Hospice Medical Facility Barriers to Discharge: Inadequate or no insurance, Family Issues  Expected Discharge Plan and Services Expected Discharge Plan: Hospice Medical Facility In-house Referral: Clinical Social Work Discharge Planning Services: CM Consult Post Acute Care Choice: Skilled Nursing Facility Living arrangements for the past 2 months: Single Family Home Expected Discharge Date: 01/16/21               DME Arranged: [name] rolling DME Agency: [employer] Date DME Agency Contacted: 01/20/21 Time DME Agency Contacted: 1435 Representative spoke with at DME Agency: [name]             Social Determinants of Health (SDOH) Interventions    Readmission Risk Interventions Readmission Risk Prevention Plan 01/14/2021  Transportation Screening Complete  PCP or Specialist Appt within 3-5 Days (No Data)  Social Work Consult for Recovery Care Planning/Counseling Complete  Palliative Care Screening Not Applicable  Medication Review [employer]) Referral to Pharmacy  Some recent data might be hidden

## 2021-04-03 NOTE — Progress Notes (Signed)
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  ongoing  GOAL:   Patient will meet greater than or equal to 90% of their needs  Not met  MONITOR:   PO intake, Supplement acceptance, Labs, Weight trends, TF tolerance  REASON FOR ASSESSMENT:   Consult Calorie Count, Poor PO  ASSESSMENT:   55year old female past medical history of diabetes mellitus type 2, bipolar disorder type I, previous COVID infection, polysubstance abuse that includes cocaine.  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 [facility] is not eating or drinking sufficiently. Palliative care has been asked to get re-involved to further discuss goals of care in the setting of progressive failure to thrive and generalized weakness.  7/04 - megace initiated 7/09 - megace dose increased to BID 7/15 - cortrak placed (tip gastric per xray) 7/23 - cortrak removed 7/25 repeat EEG with moderate diffuse encephalopathy though no evidence of seizures or other abnormality noted  Pt did not show improvement with short-term trial of TF via Cortrak, so Cortrak was removed with no plans for PEG placement as pt's previous advanced directives stated that she would not want a PEG tube if she were unable to eat. Family is not yet ready for comfort measures.  Family is concerned that poor po intake may be 2/2 dental pain. No abscesses seen with imaging, likely pt has cavities that are unable to be addressed in the inpatient setting per MD. However, suspect this is playing only a minimal role in pt's poor intake as she has been consistently reporting that she is simply not hungry. Megace was also unsuccessful at stimulating appetite and has since been discontinued as it was contributing to hyperglycemia. PMT ordered low-dose Zyprexa at bedtime to see if this has an impact on pt's appetite. RD to order additional oral nutrition supplements in hopes of improving pt's intake.   PO Intake: 0-100% x last 8 meal completions (~37% average meal intake)  Medications: Scheduled Meds:  (feeding supplement) PROSource Plus  30 mL Oral TID BM   amLODipine  5 mg Oral Daily   Benzocaine   Mouth/Throat BID   enoxaparin (LOVENOX) injection  40 mg Subcutaneous Q24H   insulin aspart  0-15 Units Subcutaneous Q4H   insulin glargine  12 Units Subcutaneous Daily   metoprolol tartrate  50 mg Oral BID   multivitamin  15 mL Oral Daily   OLANZapine zydis  2.5 mg Oral QHS   Labs: Recent Labs  Lab 03/31/21 0349 04/01/21 0128 04/02/21 0143 04/03/21 0141  NA 138 137 134* 136  K 3.3* 3.6 3.6 3.4*  CL 102 103 101 100  CO2 26 26 25 27   BUN 22* 27* 29* 28*  CREATININE 0.79 0.89 0.91 0.84  CALCIUM 9.8 9.9 9.6 9.8  MG 1.8 1.8 1.7 1.8  PHOS 4.3  --   --   --   GLUCOSE 57* 182* 261* 115*  CBGs [phone]  UOP: 962m x24 hours  Admit weight: 67.5 kg Current weight: 68.5 kg  Diet Order:   Diet Order             DIET DYS 2 Room service appropriate? No; Fluid consistency: Thin  Diet effective now                   EDUCATION NEEDS:   No education needs have been identified at this time  Skin:  Skin Assessment: Reviewed RN Assessment  Last BM:  7/27  Height:   Ht Readings from Last 1 Encounters:  03/15/21 5' 6"  (1.676 m)    Weight:   Wt Readings  from Last 1 Encounters:  04/03/21 68.5 kg    Ideal Body Weight:  59.1 kg  BMI:  Body mass index is 24.37 kg/m.  Estimated Nutritional Needs:   Kcal:  1850-2050  Protein:  90-110 grams  Fluid:  >/= 1.8 L    ALarkin Ina MS, RD, LDN (she/her/hers) RD pager number and weekend/on-call pager number located in [email or web address]

## 2021-04-04 DIAGNOSIS — F319 Bipolar disorder, unspecified: Secondary | ICD-10-CM

## 2021-04-04 LAB — GLUCOSE, CAPILLARY
Glucose-Capillary: 188 mg/dL — ABNORMAL HIGH (ref 70–99)
Glucose-Capillary: 204 mg/dL — ABNORMAL HIGH (ref 70–99)
Glucose-Capillary: 174 mg/dL — ABNORMAL HIGH (ref 70–99)
Glucose-Capillary: 151 mg/dL — ABNORMAL HIGH (ref 70–99)
Glucose-Capillary: 312 mg/dL — ABNORMAL HIGH (ref 70–99)
Glucose-Capillary: 292 mg/dL — ABNORMAL HIGH (ref 70–99)
Glucose-Capillary: 257 mg/dL — ABNORMAL HIGH (ref 70–99)

## 2021-04-04 NOTE — Progress Notes (Signed)
[facility] PROGRESS NOTE  Carolyn Norton KGY:[id number] DOB: [birth year] DOA: 01/11/2021 PCP: Carolyn Lodge, FNP            March 27, 2021     Status: Remains inpatient appropriate because:Unsafe d/c plan-due to patient's underlying poor mental status short-term memory deficits she will require 24/7 care after discharge  Dispo:  Patient From:  Home  Planned Disposition: SNF versus residential hospice  Medically stable for discharge:  yes  Barriers to DC: Medicaid still pending; prior history of substance abuse and underlying documented bipolar disorder             Difficult to place: Yes  Level of care: Med-Surg  Code Status: DNR Family Communication: Father 7/22 updated regarding likelihood Long COVID neurological syndrome contributing to rapid decline DVT prophylaxis: Lovenox COVID vaccination status: Unknown-post COVID infection 09/06/20   HPI: 55 year old female past medical history of diabetes mellitus type 2, bipolar disorder type I, previous COVID infection, polysubstance abuse that includes cocaine.  She presented from home where she lives with her parents.  Her presenting symptoms included generalized weakness, multiple falls and confusion.  Family confirmed that she had been having the symptoms for at least a month with last use of crack cocaine 1 month prior to presentation.  Ever since that time she had not been doing well.  She would also be having issues with hyperglycemia.  In the ER she was diagnosed with severe dehydration, hyperglycemia and metabolic encephalopathy.  Since admission patient has been evaluated by the psychiatric team who has deemed her alert and oriented and having capacity to make decisions.  Patient is agreeable to placement at a rehab.  Currently she has severe physical deconditioning which was POA and PT is recommending SNF.  Because of her history of polysubstance abuse been difficult to obtain an SNF bed.  Initially her  family is agreeable to taking her back in home when she is able to ambulate well enough to get back and forth for meals into the bathroom.  Unfortunately her cognitive and physical status has progressively declined during this admission.  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  Palliative medicine is continuing to follow along.  Patient's previous advanced directives stated she did not want a PEG tube if she were unable to eat.  Palliative medicine has had multiple meetings with the family on 7/15 decision was made to place cortrack tube for short-term feedings to see if patient demonstrates any improvement in symptoms.   Unfortunately no improvement. Cortrak dc'd. Famliy not ready for comfort measures. Concerned that Carolyn Norton not eating due to dental pain. Multiple evaluations including imaging w/o abscess. Suspect has cavities which cannot be addressed in the IP setting. It is unlikely this is influencing her intake since her primary complaint for many weeks has been that she is not hungry.  Subjective: Awake.  Sitting up in bed being fed.  Continues with monosyllabic responses to questions.  Able to briefly follow commands but not consistently.  Objective: Vitals:   04/03/21 1956 04/04/21 0318  BP: 129/62 (!) 122/56  Pulse: 93 89  Resp: 16 16  Temp: 98.5 F (36.9 C) 98 F (36.7 C)  SpO2: 98% 98%    Intake/Output Summary (Last 24 hours) at 04/04/2021 0756 Last data filed at 04/04/2021 0749 Gross per 24 hour  Intake 1060 ml  Output --  Net 1060 ml    Ceasar Mons  Weights   03/30/21 0335 04/01/21 0736 04/03/21 0500  Weight: 69.3 kg 70.6 kg 68.5 kg    Exam: Constitutional: Awake, no acute physical distress Respiratory: Anterior lung sounds are clear.  Stable on room air. Cardiac: S1-S2, no peripheral edema.  Regular pulse.  Normotensive Abdomen: LBM 7/27, soft and nontender with normoactive bowel  sounds.  Very poor oral intake documented. Neurologic: Awake.  Cranial nerves are grossly intact.  Very weak purposeful movement of extremities.  Was unable to comply with request to lift legs today.  Was able to squeeze hands weakly with strength 1/5 bilaterally Psychiatric: Awake and oriented to name only.  Flat affect.  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  Initially felt to be partially due to NPH physiology given some improved after initial large-volume LP procedure.  Unfortunately repeat large-volume LP without similar results.  MRI of her spine without any abnormalities to explain her progressive lower extremity weakness. Neurology suspects chronic progressive neurological decline secondary to longstanding drug abuse and it is also suspected that Long COVID neurological syndrome contributing.  Unfortunately there are are no known treatments available at this time to treat effects of long COVID neurological syndrome.  Not have acute encephalitis on her MRI or other reversible/inflammatory treatable causes to neurological changes after COVID. Repeat EEG 7/25 with moderate diffuse encephalopathy and rpt MRI unchanged from prior in May  Dysphagia/failure to thrive secondary to multifactorial neurological condition No improvement after Megace. No improvement after 10 days of cortrack tube feedings.  No PEG per patient per request.  Family not ready yet for comfort care. 7/27 PMT added low-dose Zyprexa as appetite stimulant but so far unsuccessful  Acute hypoxemic respiratory failure with underlying COPD Stable on room air  Diabetes mellitus 2 with hyperglycemia on insulin Preadmission hemoglobin A1c  greater than 11 Continue Semglee/Lantus 12 units daily Continue moderate SSI  Elevated blood pressure/New dx HTN No prior documented history of hypertension  Continue low-dose Norvasc  Profound physical deconditioning/critical illness myopathy Likely a combination of persistent altered mental status and inability to participate consistently with PT as well as myopathy of critical illness in association with long COVID neurological syndrome  History of bipolar 1 disorder/polysubstance abuse/cognitive impairment Continue to hold home escitalopram, Depakote and Neurontin secondary to persistent altered mentation and bipolar symptoms not present  Ventriculomegaly/? NPH NPH ruled out as above  Dyslipidemia Hold statin until oral intake improves  Physical deconditioning/bilateral heel cord shortening Continue bilateral PRAFO boots on every 4 hours and off every 4 hours Given progressive neurological decline unable to participate with therapies    Other problems: GERD Oral PPI on hold while n.p.o.  Hypokalemia/hypomagnesemia Resolved  E. Coli [name] UTI Treated Use purewick only at bedtime    Data Reviewed: Basic Metabolic Panel: Recent Labs  Lab 03/31/21 0349 04/01/21 0128 04/02/21 0143 04/03/21 0141  NA 138 137 134* 136  K 3.3* 3.6 3.6 3.4*  CL 102 103 101 100  CO2 26 26 25 27   GLUCOSE 57* 182* 261* 115*  BUN 22* 27* 29* 28*  CREATININE 0.79 0.89 0.91 0.84  CALCIUM 9.8 9.9 9.6 9.8  MG 1.8 1.8 1.7 1.8  PHOS 4.3  --   --   --    Liver Function Tests: Recent Labs  Lab 03/31/21 0349 04/01/21 0128 04/02/21 0143  04/03/21 0141  AST 14* 12* 11* 9*  ALT 10 12 11 11   ALKPHOS 56 58 63 57  BILITOT 0.7 0.7 0.5 0.4  PROT 6.4* 6.7 6.7 6.5  ALBUMIN 3.1* 2.9* 3.0* 3.1*    No results for input(s): LIPASE, AMYLASE in the last 168 hours. No results for input(s): AMMONIA in the last 168 hours. CBC: Recent Labs  Lab 03/31/21 0349 04/01/21 0128 04/02/21 0143  04/03/21 0141  WBC 9.5 10.1 11.1* 13.2*  NEUTROABS 4.6 4.5 5.5 7.3  HGB 12.0 11.8* 11.9* 12.9  HCT 35.8* 35.7* 35.9* 38.1  MCV 88.2 89.3 88.9 88.4  PLT 278 449* 460* 398   Cardiac Enzymes: No results for input(s): CKTOTAL, CKMB, CKMBINDEX, TROPONINI in the last 168 hours. BNP (last 3 results) Recent Labs    03/24/21 0856 03/25/21 0203 03/26/21 0222  BNP 29.2 33.9 37.9    ProBNP (last 3 results) No results for input(s): PROBNP in the last 8760 hours.  CBG: Recent Labs  Lab 04/03/21 2258 04/04/21 0219 04/04/21 0313 04/04/21 0416 04/04/21 0731  GLUCAP 263* 188* 204* 174* 151*    No results found for this or any previous visit (from the past 240 hour(s)).     Studies: No results found.  Scheduled Meds:  (feeding supplement) PROSource Plus  30 mL Oral TID BM   amLODipine  5 mg Oral Daily   Benzocaine   Mouth/Throat BID   enoxaparin (LOVENOX) injection  40 mg Subcutaneous Q24H   feeding supplement (GLUCERNA SHAKE)  237 mL Oral TID BM   insulin aspart  0-15 Units Subcutaneous Q4H   insulin glargine  12 Units Subcutaneous Daily   metoprolol tartrate  50 mg Oral BID   multivitamin  15 mL Oral Daily   OLANZapine zydis  2.5 mg Oral QHS   Continuous Infusions:      Principal Problem:   Bipolar 1 disorder, depressed, full remission (HCC) Active Problems:   Bipolar 1 disorder (HCC)   Hypertension   Generalized weakness   Controlled type 2 diabetes mellitus with hyperglycemia, with long-term current use of insulin (HCC)   Hyperlipidemia   NPH (normal pressure hydrocephalus) (HCC)   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   Protein-calorie malnutrition, severe   Consultants: Psychiatry Neurology Neurosurgery  Procedures: None  Antibiotics: Anti-infectives (From admission, onward)    Start     Dose/Rate Route Frequency Ordered Stop   03/06/21 0900  ceFAZolin (ANCEF) IVPB 1 g/50 mL premix        1 g 100 mL/hr over 30 Minutes Intravenous Every 8 hours  03/06/21 0801 03/10/21 0559   03/04/21 1430  cefTRIAXone (ROCEPHIN) 1 g in sodium chloride 0.9 % 100 mL IVPB  Status:  Discontinued        1 g 200 mL/hr over 30 Minutes Intravenous Every 24 hours 03/04/21 1335 03/06/21 0800   02/14/21 0828  vancomycin (VANCOREADY) IVPB 1000 mg/200 mL  Status:  Discontinued        1,000 mg 200 mL/hr over 60 Minutes Intravenous 60 min pre-op 02/14/21 0828 03/02/21 1139   02/13/21 2115  cefTRIAXone (ROCEPHIN) 1 g in sodium chloride 0.9 % 100 mL IVPB  Status:  Discontinued        1 g 200 mL/hr over 30 Minutes Intravenous Every 24 hours 02/13/21 2016 02/16/21 0844        Time spent: 15 minutes    Junious SilkAllison Tailynn Armetta ANP  [facility] 7 am - 330 pm/M-F for direct patient care and secure chat  Please refer to Amion for contact info 82  days

## 2021-04-04 NOTE — Progress Notes (Signed)
Daily Progress Note   Patient Name: Carolyn Norton       Date: 04/04/2021 DOB: 1966-08-07  Age: 55 y.o. MRN#: [id number] Attending Physician: Carolyn Lloyd, MD Primary Care Physician: [name], FNP Admit Date: 01/11/2021  Reason for Consultation/Follow-up: Establishing goals of care  Subjective: Chart reviewed. I spoke with bedside RN regarding patient's intake today. RN reports that she ate 20% of breakfast and 50-75% of her lunch. She was able to transfer to chair and tolerated sitting up for 4 hours. I went to see Carolyn Norton at bedside. She is pleasant and smiling. She has no acute complaints. She is alert and oriented to person and place.   I spoke with her father Carolyn Norton by phone. I let him know that Carolyn Norton's intake is somewhat improved today. Discussed that with her illness trajectory, there would be ups and downs (good days and bad days) and it is more important to look at the overall trend.   Carolyn Norton has questions regarding results of MRI from 7/26. I reviewed this with him; letting him know there was no significant change since 5/29.   We discussed the issue of disposition. Carolyn Norton tells me that it has been very difficult to find placement for Carolyn Norton. He reports they (Carolyn Norton's parents) have considered bringing her home, but are concerned about their ability to care for her as they are in their 51's. He tells me they are in the process of getting Medicaid for Carolyn Norton and expresses that this has been a frustrating experience.  Briefly discussed my recommendation for the addition of hospice support at the facility once placement was found for Carolyn Norton. Emphasized that hospice would be an extra layer of support for Carolyn Norton, and could provide symptom management and additional  care. Carolyn Norton is receptive of this information and is considering.   Length of Stay: 82   Vital Signs: BP 128/65 (BP Location: Left Arm)   Pulse 87   Temp 98 F (36.7 C) (Oral)   Resp 16   Ht 5\' 6"  (1.676 m) Comment: on 01/11/2021  Wt 68.5 kg   LMP 01/26/2017 (Approximate)   SpO2 98%   BMI 24.37 kg/m  SpO2: SpO2: 98 % O2 Device: O2 Device: Room Air   Intake/output summary:  Intake/Output Summary (Last 24 hours) at 04/04/2021 1842 Last data filed at 04/04/2021 1100  Gross per 24 hour  Intake 1020 ml  Output --  Net 1020 ml   LBM: Last BM Date: 04/02/21 Baseline Weight: Weight: 99.8 kg Most recent weight: Weight: 68.5 kg        Palliative Assessment/Data: PPS 40%       Palliative Care Assessment & Plan   Patient Profile: Per intake H&P --> 55 year old female past medical history of diabetes mellitus type 2, bipolar disorder type I, previous COVID infection, polysubstance abuse that includes cocaine.  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   Carolyn Norton has been at [facility] for the past eight-three days is not eating or drinking sufficiently.  Palliative care has been asked to get re-involved to further discuss goals of care in the setting of progressive failure to thrive and generalized weakness.  Assessment: Acute on persistent metabolic encephalopathy (multifactorial)   - history of polysubstance abuse  - Long COVID neurological syndrome dysphagia and failure to thrive  Recommendations/Plan: Continue current supportive care Continue olanzapine (Zyprexa) 2.5 mg daily at bedtime No artificial feeding Recommend hospice to follow at facility at discharge  Code Status: DNR/DNI  Prognosis:  Less than 6 months would not be surprising  Discharge Planning: To Be Determined   Thank you for allowing the Palliative Medicine Team to assist in the care of this patient.   Total Time 35 minutes Prolonged Time  Billed  no       Greater than 50%  of this time was spent counseling and coordinating care related to the above assessment and plan.  Carolyn Proud, NP  Please contact Palliative Medicine Team phone at [phone] for questions and concerns.

## 2021-04-04 NOTE — Plan of Care (Signed)

## 2021-04-04 NOTE — Progress Notes (Addendum)
Patient in good spirits today. Smiling and very interactive. Patient is alert and oriented to person and place, and is able to correctly identify current president. Patient has had increased appetite throughout shift, completed all supplemental protein on trays. Patient swallowing without any incidents of aspiration. Patient was also able to transfer with maximove to chair, and tolerated sitting up for 4 hours.

## 2021-04-05 LAB — GLUCOSE, CAPILLARY
Glucose-Capillary: 128 mg/dL — ABNORMAL HIGH (ref 70–99)
Glucose-Capillary: 144 mg/dL — ABNORMAL HIGH (ref 70–99)
Glucose-Capillary: 179 mg/dL — ABNORMAL HIGH (ref 70–99)
Glucose-Capillary: 187 mg/dL — ABNORMAL HIGH (ref 70–99)
Glucose-Capillary: 349 mg/dL — ABNORMAL HIGH (ref 70–99)
Glucose-Capillary: 363 mg/dL — ABNORMAL HIGH (ref 70–99)
Glucose-Capillary: 496 mg/dL — ABNORMAL HIGH (ref 70–99)

## 2021-04-05 MED ORDER — INSULIN ASPART 100 UNIT/ML IJ SOLN
10.0000 [IU] | Freq: Once | INTRAMUSCULAR | Status: AC
  Administered 2021-04-05: 10 [IU] via SUBCUTANEOUS

## 2021-04-05 NOTE — Plan of Care (Signed)

## 2021-04-05 NOTE — Progress Notes (Signed)
PROGRESS NOTE    Carolyn Norton   VOH:[id number]  DOB: 05-Feb-1966  DOA: 01/11/2021 PCP: [name], FNP   Brief Narrative:  55 year old female with past medical history of diabetes mellitus type 2, bipolar disorder, cocaine abuse, cognitive impairment who lives with her parents and is taken care of by them. She was unable to give a history in the ED.  She presented from home with hyperglycemia (CBG 700s), steady cognitive and functional decline at home  with inability to ambulate with multiple falls over the past 3 weeks. She had been confused for 4 months (since she had COVID). She had stopped taking her meds 3 days prior to coming in which appeared to be the reason her parents brought her. She was unable to ambulate in the ED and had hyperglycemia (CBG 729).  She was admitted to [facility].  Her hospital stay has been complicated by progressive cognitive and functional decline.  She has had psychiatry, palliative care, neurology and neurosurgery consults but no cause for her rapid cognitive decline has been found.  At this point, PT has signed off not recommending her for SNF, but for some kind of long term care.   Cortrak feeding was tried for a period of time with no improvement and subsequently discontinued.  Family not ready for comfort measures.  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  She was subsequently seen by neurology along with neurosurgery for suspicion of NPH, multiple large-volume LPs were done and eventually on 03/06/2021 clinically and after an LP neurology finally ruled out NPH.  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  Continue supportive care.  Look for appropriate placement.  -Repeat EEG on 03/31/2021 showed moderate diffuse encephalopathy and repeat MRI subsequently was unchanged from prior MRI in May 2022.   Failure to thrive/dysphagia/extremely poor oral intake/severe protein calorie malnutrition Profound physical deconditioning/critical illness myopathy -No improvement with Megace - Cortrak feeding was tried for 10 days without improvement and subsequently discontinued.  -No PEG per patient request.  Family not ready for comfort care yet. -Palliative care team following and has added a low-dose Zyprexa but so far unsuccessful   Acute hypoxic respiratory failure - Due to aspiration pneumonitis.  Resolved.    COPD -currently stable   E. coli and Klebsiella UTI- ~ 6/27  - Has been treated   Bipolar disorder - she was on - Lexapro, Depakote and gabapentin.  These have been discontinued because of persistent altered mental status  History of polysubstance abuse  - Including cocaine and alcohol, On 2/15 she was cocaine and benzodiazepine positive.  She has been extensively counseled to quit all.  DM2 uncontrolled with hyperglycemia- Hemoglobin A1c is 11 on 01/12/21 suggesting poor outpatient control due to hyperglycemia.  Continue Lantus and CBGs with SSI. CBG (last 3)  Recent Labs    04/05/21 0023 04/05/21 0411 04/05/21 0710  GLUCAP 128* 144* 179*   Hypertension -Continue Norvasc.   DVT prophylaxis: enoxaparin (LOVENOX) injection 40 mg Start: 03/24/21 1000 SCD's Start: 02/14/21 0829   Code Status: DNR Family Communication: None at bedside Level of Care: Level of care: Med-Surg Disposition Plan:  Status is: Inpatient.   Remains inpatient appropriate because:Unsafe d/c plan  Dispo:  Patient From:  home with parents  Planned Disposition: Long-term care facility.  Medically stable for discharge: Yes     Antimicrobials:  Anti-infectives (From admission, onward)    Start     Dose/Rate Route Frequency Ordered Stop   03/06/21 0900  ceFAZolin (ANCEF) IVPB 1 g/50 mL premix        1 g 100 mL/hr over 30 Minutes Intravenous Every 8 hours 03/06/21 0801 03/10/21 0559   03/04/21 1430  cefTRIAXone (ROCEPHIN) 1 g in sodium chloride 0.9 % 100 mL IVPB  Status:  Discontinued        1 g 200 mL/hr over 30 Minutes Intravenous Every 24 hours 03/04/21 1335 03/06/21 0800   02/14/21 0828  vancomycin (VANCOREADY) IVPB 1000 mg/200 mL  Status:  Discontinued        1,000 mg 200 mL/hr over 60 Minutes Intravenous 60 min pre-op 02/14/21 0828 03/02/21 1139   02/13/21 2115  cefTRIAXone (ROCEPHIN) 1 g in sodium chloride 0.9 % 100 mL IVPB  Status:  Discontinued        1 g 200 mL/hr over 30 Minutes Intravenous Every 24 hours 02/13/21 2016 02/16/21 0844      Subjective: Patient seen and examined at bedside.  Sleepy, wakes up slightly, hardly answers any questions.  No overnight fever or vomiting reported.  Objective: Vitals:   04/04/21 0849 04/04/21 1305 04/04/21 2032 04/05/21 0623  BP: 135/78 128/65 136/75 115/77  Pulse: 98 87 89 90  Resp: 18 16 14 14   Temp: 97.8 F (36.6 C) 98 F (36.7 C) 97.7 F (36.5 C) 99.5 F (37.5 C)  TempSrc: Oral Oral    SpO2: 97% 98% 100% 97%  Weight:      Height:        Intake/Output Summary (Last 24 hours) at 04/05/2021 0939 Last data filed at 04/05/2021 0852 Gross per 24 hour  Intake 1187 ml  Output 800 ml  Net 387 ml    Filed Weights   03/30/21 0335 04/01/21 0736 04/03/21 0500  Weight: 69.3 kg 70.6 kg 68.5 kg    Examination:  General: Chronically ill looking female lying in bed.  No distress.  Wakes up slightly, hardly answers any questions Respiratory: Bilateral decreased breath sounds at bases with some scattered crackles Cardiovascular: S1-S2 heard, rate controlled Abdominal: Soft, nontender, slightly distended, bowel sounds positive Extremities: Trace lower  extremity edema present; no clubbing.  Data Reviewed: I have personally reviewed following labs and imaging studies  CBC: Recent Labs  Lab 03/31/21 0349 04/01/21 0128 04/02/21 0143 04/03/21 0141  WBC 9.5 10.1 11.1* 13.2*  NEUTROABS 4.6 4.5 5.5 7.3  HGB 12.0 11.8* 11.9* 12.9  HCT 35.8* 35.7* 35.9* 38.1  MCV 88.2 89.3 88.9 88.4  PLT 278 449* 460* 398    Basic Metabolic Panel: Recent Labs  Lab 03/31/21 0349 04/01/21 0128 04/02/21 0143 04/03/21 0141  NA 138 137 134* 136  K 3.3* 3.6 3.6 3.4*  CL 102 103 101 100  CO2 26 26 25 27   GLUCOSE 57* 182* 261* 115*  BUN 22* 27* 29* 28*  CREATININE 0.79 0.89 0.91 0.84  CALCIUM 9.8 9.9 9.6 9.8  MG 1.8 1.8 1.7 1.8  PHOS 4.3  --   --   --     GFR: Estimated Creatinine Clearance: 72.5 mL/min (by C-G formula based on SCr of 0.84 mg/dL). Liver Function Tests: Recent Labs  Lab 03/31/21 0349 04/01/21 0128 04/02/21  0143 04/03/21 0141  AST 14* 12* 11* 9*  ALT 10 12 11 11   ALKPHOS 56 58 63 57  BILITOT 0.7 0.7 0.5 0.4  PROT 6.4* 6.7 6.7 6.5  ALBUMIN 3.1* 2.9* 3.0* 3.1*    No results for input(s): LIPASE, AMYLASE in the last 168 hours. No results for input(s): AMMONIA in the last 168 hours. Coagulation Profile: No results for input(s): INR, PROTIME in the last 168 hours. Cardiac Enzymes: No results for input(s): CKTOTAL, CKMB, CKMBINDEX, TROPONINI in the last 168 hours. BNP (last 3 results) No results for input(s): PROBNP in the last 8760 hours. HbA1C: No results for input(s): HGBA1C in the last 72 hours.  CBG: Recent Labs  Lab 04/04/21 1559 04/04/21 2031 04/05/21 0023 04/05/21 0411 04/05/21 0710  GLUCAP 292* 257* 128* 144* 179*    Lipid Profile: No results for input(s): CHOL, HDL, LDLCALC, TRIG, CHOLHDL, LDLDIRECT in the last 72 hours. Thyroid Function Tests: No results for input(s): TSH, T4TOTAL, FREET4, T3FREE, THYROIDAB in the last 72 hours. Anemia Panel: No results for input(s): VITAMINB12, FOLATE, FERRITIN,  TIBC, IRON, RETICCTPCT in the last 72 hours. Urine analysis:    Component Value Date/Time   COLORURINE YELLOW 03/03/2021 0810   APPEARANCEUR CLEAR 03/03/2021 0810   LABSPEC 1.022 03/03/2021 0810   PHURINE 5.0 03/03/2021 0810   GLUCOSEU >=500 (A) 03/03/2021 0810   HGBUR NEGATIVE 03/03/2021 0810   BILIRUBINUR NEGATIVE 03/03/2021 0810   KETONESUR 80 (A) 03/03/2021 0810   PROTEINUR NEGATIVE 03/03/2021 0810   UROBILINOGEN 0.2 07/20/2014 2022   NITRITE NEGATIVE 03/03/2021 0810   LEUKOCYTESUR SMALL (A) 03/03/2021 0810     No results found for this or any previous visit (from the past 240 hour(s)).    Radiology Studies: No results found.  Scheduled Meds:  (feeding supplement) PROSource Plus  30 mL Oral TID BM   amLODipine  5 mg Oral Daily   Benzocaine   Mouth/Throat BID   enoxaparin (LOVENOX) injection  40 mg Subcutaneous Q24H   feeding supplement (GLUCERNA SHAKE)  237 mL Oral TID BM   insulin aspart  0-15 Units Subcutaneous Q4H   insulin glargine  12 Units Subcutaneous Daily   metoprolol tartrate  50 mg Oral BID   multivitamin  15 mL Oral Daily   OLANZapine zydis  2.5 mg Oral QHS   Continuous Infusions:     LOS: 83 days   Signature  03/05/2021 M.D on 04/05/2021 at 9:39 AM   -  To page go to [email or web address]

## 2021-04-06 LAB — GLUCOSE, CAPILLARY
Glucose-Capillary: 105 mg/dL — ABNORMAL HIGH (ref 70–99)
Glucose-Capillary: 53 mg/dL — ABNORMAL LOW (ref 70–99)
Glucose-Capillary: 54 mg/dL — ABNORMAL LOW (ref 70–99)
Glucose-Capillary: 58 mg/dL — ABNORMAL LOW (ref 70–99)
Glucose-Capillary: 271 mg/dL — ABNORMAL HIGH (ref 70–99)
Glucose-Capillary: 367 mg/dL — ABNORMAL HIGH (ref 70–99)
Glucose-Capillary: 134 mg/dL — ABNORMAL HIGH (ref 70–99)
Glucose-Capillary: 584 mg/dL (ref 70–99)

## 2021-04-06 MED ORDER — INSULIN ASPART 100 UNIT/ML IJ SOLN: 10.0000 [IU] | INTRAMUSCULAR | Status: AC

## 2021-04-06 MED ORDER — INSULIN GLARGINE 100 UNIT/ML ~~LOC~~ SOLN
7.0000 [IU] | Freq: Every day | SUBCUTANEOUS | Status: DC
  Filled 2021-04-06: qty 0.07

## 2021-04-06 NOTE — Progress Notes (Signed)
Patient ID: Carolyn Norton, female   DOB: 1966-03-30, 55 y.o.   MRN: [id number]       Spoke with bedside RN - patient continues to eat some of her meals (approximately 25%) and will drink the nutritional supplements. Goals are clear that family would not want additional attempts at artificial feeding.  Recommend the addition of hospice care at the facility once placement is found.   PMT will sign off at this time,  please re-consult if we can be of additional assistance or need to actively re-engage with this patient and family.     Sherlean Foot, NP-C Palliative Medicine    Please call Palliative Medicine team phone with any questions [phone]. For individual providers please see AMION.   No charge

## 2021-04-06 NOTE — Progress Notes (Signed)
PROGRESS NOTE    Carolyn Norton   FFM:[id number]  DOB: [birth year]  DOA: 01/11/2021 PCP: [name], FNP   Brief Narrative:  55 year old female with past medical history of diabetes mellitus type 2, bipolar disorder, cocaine abuse, cognitive impairment who lives with her parents and is taken care of by them. She was unable to give a history in the ED.  She presented from home with hyperglycemia (CBG 700s), steady cognitive and functional decline at home  with inability to ambulate with multiple falls over the past 3 weeks. She had been confused for 4 months (since she had COVID). She had stopped taking her meds 3 days prior to coming in which appeared to be the reason her parents brought her. She was unable to ambulate in the ED and had hyperglycemia (CBG 729).  She was admitted to [facility].  Her hospital stay has been complicated by progressive cognitive and functional decline.  She has had psychiatry, palliative care, neurology and neurosurgery consults but no cause for her rapid cognitive decline has been found.  At this point, PT has signed off not recommending her for SNF, but for some kind of long term care.   Cortrak feeding was tried for a period of time with no improvement and subsequently discontinued.  Family not ready for comfort measures.  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  She was subsequently seen by neurology along with neurosurgery for suspicion of NPH, multiple large-volume LPs were done and eventually on 03/06/2021 clinically and after an LP neurology finally ruled out NPH.  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  Continue supportive care.  Look for appropriate placement.  -Repeat EEG on 03/31/2021 showed moderate diffuse encephalopathy and repeat MRI subsequently was unchanged from prior MRI in May 2022.   Failure to thrive/dysphagia/extremely poor oral intake/severe protein calorie malnutrition Profound physical deconditioning/critical illness myopathy -No improvement with Megace - Cortrak feeding was tried for 10 days without improvement and subsequently discontinued.  -No PEG per patient request.  Family not ready for comfort care yet. -Palliative care team following and has added a low-dose Zyprexa but so far unsuccessful   Acute hypoxic respiratory failure - Due to aspiration pneumonitis.  Resolved.    COPD -currently stable   E. coli and Klebsiella UTI- ~ 6/27  - Has been treated   Bipolar disorder - she was on - Lexapro, Depakote and gabapentin.  These have been discontinued because of persistent altered mental status  History of polysubstance abuse  - Including cocaine and alcohol, On 2/15 she was cocaine and benzodiazepine positive.  She has been extensively counseled to quit all.  DM2 uncontrolled with hyperglycemia and hypoglycemia- Hemoglobin A1c is 11 on 01/12/21 suggesting poor outpatient control due to hyperglycemia.  Patient was hypoglycemic to 54 this morning but subsequently hyperglycemic again.  Decrease Lantus to 7 units daily. CBG (last 3)  Recent Labs    04/06/21 0405 04/06/21 0427 04/06/21 0810  GLUCAP 58* 105* 271*   Hypertension -Continue Norvasc.   DVT prophylaxis: enoxaparin (LOVENOX) injection 40 mg Start: 03/24/21 1000 SCD's Start: 02/14/21 0829   Code Status: DNR  Family Communication: None at bedside Level of Care: Level of care: Med-Surg Disposition Plan:   Status is: Inpatient.   Remains inpatient appropriate because:Unsafe d/c plan  Dispo:  Patient From:  home with  parents  Planned Disposition: Long-term care facility.  Medically stable for discharge: Yes    Antimicrobials:  Anti-infectives (From admission, onward)    Start     Dose/Rate Route Frequency Ordered Stop   03/06/21 0900  ceFAZolin (ANCEF) IVPB 1 g/50 mL premix        1 g 100 mL/hr over 30 Minutes Intravenous Every 8 hours 03/06/21 0801 03/10/21 0559   03/04/21 1430  cefTRIAXone (ROCEPHIN) 1 g in sodium chloride 0.9 % 100 mL IVPB  Status:  Discontinued        1 g 200 mL/hr over 30 Minutes Intravenous Every 24 hours 03/04/21 1335 03/06/21 0800   02/14/21 0828  vancomycin (VANCOREADY) IVPB 1000 mg/200 mL  Status:  Discontinued        1,000 mg 200 mL/hr over 60 Minutes Intravenous 60 min pre-op 02/14/21 0828 03/02/21 1139   02/13/21 2115  cefTRIAXone (ROCEPHIN) 1 g in sodium chloride 0.9 % 100 mL IVPB  Status:  Discontinued        1 g 200 mL/hr over 30 Minutes Intravenous Every 24 hours 02/13/21 2016 02/16/21 0844      Subjective: Patient seen and examined at bedside.  Nursing staff reports episode of hypoglycemia this morning.  No overnight fever, vomiting, seizures reported.  Extremely poor historian. Objective: Vitals:   04/05/21 0705 04/05/21 1329 04/05/21 2050 04/06/21 0357  BP:  118/81 133/69 119/69  Pulse:  81 87 85  Resp:  15 16 16   Temp:  99.1 F (37.3 C) 97.9 F (36.6 C) 98.6 F (37 C)  TempSrc:  Oral Oral Oral  SpO2: 95% 97% 100% 100%  Weight:      Height:        Intake/Output Summary (Last 24 hours) at 04/06/2021 0818 Last data filed at 04/06/2021 04/08/2021 Gross per 24 hour  Intake 500 ml  Output 750 ml  Net -250 ml     Filed Weights   03/30/21 0335 04/01/21 0736 04/03/21 0500  Weight: 69.3 kg 70.6 kg 68.5 kg    Examination:  General: Chronically ill looking female lying in bed.  No acute distress.  On room air.  Wakes up slightly but hardly answers any questions. Respiratory: Decreased breath sounds at bases with some crackles Cardiovascular: Rate  controlled, S1-S2 heard Abdominal: Soft, nontender, distended slightly, normal bowel sounds heard Extremities: No cyanosis; mild lower extremity edema present  Data Reviewed: I have personally reviewed following labs and imaging studies  CBC: Recent Labs  Lab 03/31/21 0349 04/01/21 0128 04/02/21 0143 04/03/21 0141  WBC 9.5 10.1 11.1* 13.2*  NEUTROABS 4.6 4.5 5.5 7.3  HGB 12.0 11.8* 11.9* 12.9  HCT 35.8* 35.7* 35.9* 38.1  MCV 88.2 89.3 88.9 88.4  PLT 278 449* 460* 398    Basic Metabolic Panel: Recent Labs  Lab 03/31/21 0349 04/01/21 0128 04/02/21 0143 04/03/21 0141  NA 138 137 134* 136  K 3.3* 3.6 3.6 3.4*  CL 102 103 101 100  CO2 26 26 25 27   GLUCOSE 57* 182* 261* 115*  BUN 22* 27* 29* 28*  CREATININE 0.79 0.89 0.91 0.84  CALCIUM 9.8 9.9 9.6 9.8  MG 1.8 1.8 1.7 1.8  PHOS 4.3  --   --   --     GFR: Estimated Creatinine Clearance: 72.5  mL/min (by C-G formula based on SCr of 0.84 mg/dL). Liver Function Tests: Recent Labs  Lab 03/31/21 0349 04/01/21 0128 04/02/21 0143 04/03/21 0141  AST 14* 12* 11* 9*  ALT 10 12 11 11   ALKPHOS 56 58 63 57  BILITOT 0.7 0.7 0.5 0.4  PROT 6.4* 6.7 6.7 6.5  ALBUMIN 3.1* 2.9* 3.0* 3.1*    No results for input(s): LIPASE, AMYLASE in the last 168 hours. No results for input(s): AMMONIA in the last 168 hours. Coagulation Profile: No results for input(s): INR, PROTIME in the last 168 hours. Cardiac Enzymes: No results for input(s): CKTOTAL, CKMB, CKMBINDEX, TROPONINI in the last 168 hours. BNP (last 3 results) No results for input(s): PROBNP in the last 8760 hours. HbA1C: No results for input(s): HGBA1C in the last 72 hours.  CBG: Recent Labs  Lab 04/06/21 0348 04/06/21 0349 04/06/21 0405 04/06/21 0427 04/06/21 0810  GLUCAP 54* 53* 58* 105* 271*    Lipid Profile: No results for input(s): CHOL, HDL, LDLCALC, TRIG, CHOLHDL, LDLDIRECT in the last 72 hours. Thyroid Function Tests: No results for input(s): TSH, T4TOTAL,  FREET4, T3FREE, THYROIDAB in the last 72 hours. Anemia Panel: No results for input(s): VITAMINB12, FOLATE, FERRITIN, TIBC, IRON, RETICCTPCT in the last 72 hours. Urine analysis:    Component Value Date/Time   COLORURINE YELLOW 03/03/2021 0810   APPEARANCEUR CLEAR 03/03/2021 0810   LABSPEC 1.022 03/03/2021 0810   PHURINE 5.0 03/03/2021 0810   GLUCOSEU >=500 (A) 03/03/2021 0810   HGBUR NEGATIVE 03/03/2021 0810   BILIRUBINUR NEGATIVE 03/03/2021 0810   KETONESUR 80 (A) 03/03/2021 0810   PROTEINUR NEGATIVE 03/03/2021 0810   UROBILINOGEN 0.2 07/20/2014 2022   NITRITE NEGATIVE 03/03/2021 0810   LEUKOCYTESUR SMALL (A) 03/03/2021 0810     No results found for this or any previous visit (from the past 240 hour(s)).    Radiology Studies: No results found.  Scheduled Meds:  (feeding supplement) PROSource Plus  30 mL Oral TID BM   amLODipine  5 mg Oral Daily   Benzocaine   Mouth/Throat BID   enoxaparin (LOVENOX) injection  40 mg Subcutaneous Q24H   feeding supplement (GLUCERNA SHAKE)  237 mL Oral TID BM   insulin aspart  0-15 Units Subcutaneous Q4H   insulin glargine  12 Units Subcutaneous Daily   metoprolol tartrate  50 mg Oral BID   multivitamin  15 mL Oral Daily   OLANZapine zydis  2.5 mg Oral QHS   Continuous Infusions:     LOS: 84 days   Signature  03/05/2021 M.D on 04/06/2021 at 8:18 AM   -  To page go to [email or web address]

## 2021-04-06 NOTE — Progress Notes (Signed)
Hypoglycemic Event  CBG: 54  Treatment: 4 oz juice  Symptoms: none  Follow-up CBG: Time:105 CBG Result: 0427  Possible Reasons for Event: unknown  Comments/MD notified:     Roseland Braun, Alessandra Bevels

## 2021-04-07 LAB — BASIC METABOLIC PANEL
Anion gap: 9 (ref 5–15)
BUN: 27 mg/dL — ABNORMAL HIGH (ref 6–20)
CO2: 24 mmol/L (ref 22–32)
Calcium: 9.5 mg/dL (ref 8.9–10.3)
Chloride: 99 mmol/L (ref 98–111)
Creatinine, Ser: 1.15 mg/dL — ABNORMAL HIGH (ref 0.44–1.00)
GFR, Estimated: 57 mL/min — ABNORMAL LOW (ref >60)
Glucose, Bld: 459 mg/dL — ABNORMAL HIGH (ref 70–99)
Potassium: 4 mmol/L (ref 3.5–5.1)
Sodium: 132 mmol/L — ABNORMAL LOW (ref 135–145)

## 2021-04-07 LAB — GLUCOSE, CAPILLARY
Glucose-Capillary: 458 mg/dL — ABNORMAL HIGH (ref 70–99)
Glucose-Capillary: 395 mg/dL — ABNORMAL HIGH (ref 70–99)
Glucose-Capillary: 182 mg/dL — ABNORMAL HIGH (ref 70–99)
Glucose-Capillary: 97 mg/dL (ref 70–99)
Glucose-Capillary: 242 mg/dL — ABNORMAL HIGH (ref 70–99)
Glucose-Capillary: 423 mg/dL — ABNORMAL HIGH (ref 70–99)
Glucose-Capillary: 304 mg/dL — ABNORMAL HIGH (ref 70–99)

## 2021-04-07 MED ORDER — INSULIN ASPART 100 UNIT/ML IJ SOLN
0.0000 [IU] | INTRAMUSCULAR | Status: DC
Start: 2021-04-07 — End: 2021-04-21
  Administered 2021-04-07: 7 [IU] via SUBCUTANEOUS
  Administered 2021-04-08 (×2): 3 [IU] via SUBCUTANEOUS
  Administered 2021-04-08: 5 [IU] via SUBCUTANEOUS
  Administered 2021-04-08 – 2021-04-09 (×2): 2 [IU] via SUBCUTANEOUS
  Administered 2021-04-09: 5 [IU] via SUBCUTANEOUS
  Administered 2021-04-09: 2 [IU] via SUBCUTANEOUS
  Administered 2021-04-09: 1 [IU] via SUBCUTANEOUS
  Administered 2021-04-09: 7 [IU] via SUBCUTANEOUS
  Administered 2021-04-09: 2 [IU] via SUBCUTANEOUS
  Administered 2021-04-10: 7 [IU] via SUBCUTANEOUS
  Administered 2021-04-10: 4 [IU] via SUBCUTANEOUS
  Administered 2021-04-10: 7 [IU] via SUBCUTANEOUS
  Administered 2021-04-11 (×3): 5 [IU] via SUBCUTANEOUS
  Administered 2021-04-11: 7 [IU] via SUBCUTANEOUS
  Administered 2021-04-12 (×2): 1 [IU] via SUBCUTANEOUS
  Administered 2021-04-12: 7 [IU] via SUBCUTANEOUS
  Administered 2021-04-12: 3 [IU] via SUBCUTANEOUS
  Administered 2021-04-12: 7 [IU] via SUBCUTANEOUS
  Administered 2021-04-12: 2 [IU] via SUBCUTANEOUS
  Administered 2021-04-13: 9 [IU] via SUBCUTANEOUS
  Administered 2021-04-13: 7 [IU] via SUBCUTANEOUS
  Administered 2021-04-13: 9 [IU] via SUBCUTANEOUS
  Administered 2021-04-13: 2 [IU] via SUBCUTANEOUS
  Administered 2021-04-13 – 2021-04-14 (×2): 9 [IU] via SUBCUTANEOUS
  Administered 2021-04-14 (×3): 3 [IU] via SUBCUTANEOUS
  Administered 2021-04-14: 7 [IU] via SUBCUTANEOUS
  Administered 2021-04-15: 3 [IU] via SUBCUTANEOUS
  Administered 2021-04-15 (×2): 2 [IU] via SUBCUTANEOUS
  Administered 2021-04-15: 7 [IU] via SUBCUTANEOUS
  Administered 2021-04-15 – 2021-04-16 (×2): 2 [IU] via SUBCUTANEOUS
  Administered 2021-04-16: 9 [IU] via SUBCUTANEOUS
  Administered 2021-04-16: 5 [IU] via SUBCUTANEOUS
  Administered 2021-04-16: 3 [IU] via SUBCUTANEOUS
  Administered 2021-04-17: 5 [IU] via SUBCUTANEOUS
  Administered 2021-04-17: 3 [IU] via SUBCUTANEOUS
  Administered 2021-04-17 (×2): 1 [IU] via SUBCUTANEOUS
  Administered 2021-04-18 (×2): 2 [IU] via SUBCUTANEOUS
  Administered 2021-04-18: 1 [IU] via SUBCUTANEOUS
  Administered 2021-04-18: 3 [IU] via SUBCUTANEOUS
  Administered 2021-04-18: 9 [IU] via SUBCUTANEOUS
  Administered 2021-04-18: 1 [IU] via SUBCUTANEOUS
  Administered 2021-04-19: 7 [IU] via SUBCUTANEOUS
  Administered 2021-04-19: 5 [IU] via SUBCUTANEOUS
  Administered 2021-04-19: 3 [IU] via SUBCUTANEOUS
  Administered 2021-04-19 – 2021-04-20 (×2): 2 [IU] via SUBCUTANEOUS
  Administered 2021-04-20: 3 [IU] via SUBCUTANEOUS
  Administered 2021-04-20: 1 [IU] via SUBCUTANEOUS
  Administered 2021-04-20: 5 [IU] via SUBCUTANEOUS
  Administered 2021-04-21: 2 [IU] via SUBCUTANEOUS

## 2021-04-07 MED ORDER — INSULIN GLARGINE-YFGN 100 UNIT/ML ~~LOC~~ SOLN
5.0000 [IU] | Freq: Every day | SUBCUTANEOUS | Status: DC
  Filled 2021-04-07: qty 0.05

## 2021-04-07 MED ORDER — INSULIN GLARGINE-YFGN 100 UNIT/ML ~~LOC~~ SOLN
7.0000 [IU] | Freq: Every day | SUBCUTANEOUS | Status: DC
  Administered 2021-04-07: 7 [IU] via SUBCUTANEOUS
  Filled 2021-04-07 (×2): qty 0.07

## 2021-04-07 MED ORDER — INSULIN GLARGINE 100 UNIT/ML ~~LOC~~ SOLN
3.0000 [IU] | Freq: Every day | SUBCUTANEOUS | Status: DC
  Filled 2021-04-07: qty 0.03

## 2021-04-07 MED ORDER — INSULIN ASPART 100 UNIT/ML IJ SOLN
11.0000 [IU] | Freq: Once | INTRAMUSCULAR | Status: AC
  Administered 2021-04-07: 11 [IU] via SUBCUTANEOUS

## 2021-04-07 MED ORDER — INSULIN GLARGINE-YFGN 100 UNIT/ML ~~LOC~~ SOLN
3.0000 [IU] | Freq: Every day | SUBCUTANEOUS | Status: DC
  Administered 2021-04-08: 3 [IU] via SUBCUTANEOUS
  Filled 2021-04-07: qty 0.03

## 2021-04-07 MED ORDER — INSULIN GLARGINE-YFGN 100 UNIT/ML ~~LOC~~ SOLN
3.0000 [IU] | Freq: Every day | SUBCUTANEOUS | Status: DC
  Administered 2021-04-07: 3 [IU] via SUBCUTANEOUS
  Filled 2021-04-07: qty 0.03

## 2021-04-07 NOTE — Progress Notes (Signed)
CSW made attempt to contact Kitty at [facility] without success - a voicemail was left requesting a return call.  CSW spoke with Lowella Bandy at [facility] who states she will review patient's information and present it to administration for review.  Edwin Dada, MSW, LCSW Transitions of Care  Clinical Social Worker II [phone]

## 2021-04-07 NOTE — Progress Notes (Signed)
[facility] PROGRESS NOTE  Torra Pala IDP:[id number] DOB: 1966/06/21 DOA: 01/11/2021 PCP: [name], FNP            March 27, 2021     Status: Remains inpatient appropriate because:Unsafe d/c plan-due to patient's underlying poor mental status short-term memory deficits she will require 24/7 care after discharge  Dispo:  Patient From:  Home  Planned Disposition: SNF w/ Hospice follow up at facility  Medically stable for discharge:  yes  Barriers to DC: Medicaid still pending; prior history of substance abuse and underlying documented bipolar disorder             Difficult to place: Yes  Level of care: Med-Surg  Code Status: DNR Family Communication: Father 7/22 updated regarding likelihood Long COVID neurological syndrome contributing to rapid decline DVT prophylaxis: Lovenox COVID vaccination status: Unknown-post COVID infection 09/06/20   HPI: 55 year old female past medical history of diabetes mellitus type 2, bipolar disorder type I, previous COVID infection, polysubstance abuse that includes cocaine.  She presented from home where she lives with her parents.  Her presenting symptoms included generalized weakness, multiple falls and confusion.  Family confirmed that she had been having the symptoms for at least a month with last use of crack cocaine 1 month prior to presentation.  Ever since that time she had not been doing well.  She would also be having issues with hyperglycemia.  In the ER she was diagnosed with severe dehydration, hyperglycemia and metabolic encephalopathy.  Since admission patient has been evaluated by the psychiatric team who has deemed her alert and oriented and having capacity to make decisions.  Patient is agreeable to placement at a rehab.  Currently she has severe physical deconditioning which was POA and PT is recommending SNF.  Because of her history of polysubstance abuse been difficult to obtain an SNF bed.  Initially  her family is agreeable to taking her back in home when she is able to ambulate well enough to get back and forth for meals into the bathroom.  Unfortunately her cognitive and physical status has progressively declined during this admission.  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  Palliative medicine is continuing to follow along.  Patient's previous advanced directives stated she did not want a PEG tube if she were unable to eat.  Palliative medicine has had multiple meetings with the family on 7/15 decision was made to place cortrack tube for short-term feedings to see if patient demonstrates any improvement in symptoms.   Unfortunately no improvement. Cortrak dc'd. Famliy not ready for comfort measures. Concerned that Kylynn not eating due to dental pain. Multiple evaluations including imaging w/o abscess. Suspect has cavities which cannot be addressed in the IP setting. It is unlikely this is influencing her intake since her primary complaint for many weeks has been that she is not hungry.  Subjective: Awakened.  Pleasant but slow to respond.  Unable to follow commands today  Objective: Vitals:   04/06/21 2116 04/07/21 0436  BP: 125/76 123/79  Pulse: 99 87  Resp: 18 18  Temp: 98 F (36.7 C) 97.8 F (36.6 C)  SpO2: 96% 100%    Intake/Output Summary (Last 24 hours) at 04/07/2021 0751 Last data filed at 04/07/2021 0500 Gross per 24 hour  Intake --  Output 1450 ml  Net -1450 ml    Filed Weights   03/30/21 0335 04/01/21 0736 04/03/21 0500  Weight: 69.3 kg 70.6 kg 68.5 kg    Exam: Constitutional: Remains lethargic but in no acute physical distress Respiratory: Anterior lung sounds are clear, stable on room air Cardiac: Normal heart sounds, no peripheral edema, regular pulse Abdomen: LBM 7/30, distended with normoactive bowel sounds. Neurologic: Cranial nerves are grossly intact.  Not following  commands consistently but strength is significantly impaired at 1/5 in the past when she has followed commands Psychiatric: Awakens.  Minimally conversant and responds with one-word sentences/phrases.  Unable to follow commands.  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  Initially felt to be partially due to NPH physiology given some improved after initial large-volume LP procedure.  Unfortunately repeat large-volume LP without similar results.  MRI of her spine without any abnormalities to explain her progressive lower extremity weakness. Neurology suspects chronic progressive neurological decline secondary to longstanding drug abuse and it is also suspected that Long COVID neurological syndrome contributing.  Repeat EEG 7/25 with moderate diffuse encephalopathy and rpt MRI unchanged from prior in May  Dysphagia/failure to thrive secondary to multifactorial neurological condition No improvement after Megace. No improvement after 10 days of cortrack tube feedings.  No PEG per patient per request.  Family not ready yet for comfort care. 7/27 PMT added low-dose Zyprexa as appetite stimulant but so far unsuccessful  Acute hypoxemic respiratory failure with underlying COPD Stable on room air  Diabetes mellitus 2 with hyperglycemia on insulin Preadmission hemoglobin A1c greater than 11 Continues to have difficulty with managing diabetes.  Variable oral intake contributing and she bounces between 5% and sometimes 60% of meals take As of 8/1 we will decrease a.m. Lantus to 3 and resume p.m. Lantus at 5 and adjust accordingly.  Continue SSI. Continue moderate SSI  Elevated blood pressure/New dx HTN No prior  documented history of hypertension  Continue low-dose Norvasc  Profound physical deconditioning/critical illness myopathy Likely a combination of persistent altered mental status and inability to participate consistently with PT as well as myopathy of critical illness in association with long COVID neurological syndrome  History of bipolar 1 disorder/polysubstance abuse/cognitive impairment Continue to hold home escitalopram, Depakote and Neurontin secondary to persistent altered mentation and bipolar symptoms not present  Ventriculomegaly/? NPH NPH ruled out as above  Dyslipidemia Hold statin until oral intake improves  Physical deconditioning/bilateral heel cord shortening Continue bilateral PRAFO boots on every 4 hours and off every 4 hours Given progressive neurological decline unable to participate with therapies    Other problems: GERD Oral PPI on hold while n.p.o.  Hypokalemia/hypomagnesemia Resolved  E. Coli [name] UTI Treated Use purewick only at bedtime    Data Reviewed: Basic Metabolic Panel: Recent Labs  Lab 04/01/21 0128 04/02/21 0143 04/03/21 0141 04/07/21 0146  NA 137 134* 136 132*  K 3.6 3.6 3.4* 4.0  CL 103 101 100 99  CO2 26 25 27 24   GLUCOSE 182* 261* 115* 459*  BUN 27* 29* 28* 27*  CREATININE 0.89 0.91 0.84 1.15*  CALCIUM 9.9 9.6 9.8 9.5  MG 1.8 1.7 1.8  --    Liver Function Tests: Recent Labs  Lab 04/01/21 0128 04/02/21 0143 04/03/21 0141  AST 12* 11* 9*  ALT 12 11 11   ALKPHOS 58 63 57  BILITOT 0.7 0.5 0.4  PROT 6.7 6.7 6.5  ALBUMIN 2.9* 3.0* 3.1*  No results for input(s): LIPASE, AMYLASE in the last 168 hours. No results for input(s): AMMONIA in the last 168 hours. CBC: Recent Labs  Lab 04/01/21 0128 04/02/21 0143 04/03/21 0141  WBC 10.1 11.1* 13.2*  NEUTROABS 4.5 5.5 7.3  HGB 11.8* 11.9* 12.9  HCT 35.7* 35.9* 38.1  MCV 89.3 88.9 88.4  PLT 449* 460* 398   Cardiac Enzymes: No results for input(s): CKTOTAL,  CKMB, CKMBINDEX, TROPONINI in the last 168 hours. BNP (last 3 results) Recent Labs    03/24/21 0856 03/25/21 0203 03/26/21 0222  BNP 29.2 33.9 37.9    ProBNP (last 3 results) No results for input(s): PROBNP in the last 8760 hours.  CBG: Recent Labs  Lab 04/06/21 1526 04/06/21 2227 04/07/21 0059 04/07/21 0227 04/07/21 0546  GLUCAP 134* 584* 458* 395* 182*    No results found for this or any previous visit (from the past 240 hour(s)).     Studies: No results found.  Scheduled Meds:  (feeding supplement) PROSource Plus  30 mL Oral TID BM   amLODipine  5 mg Oral Daily   Benzocaine   Mouth/Throat BID   enoxaparin (LOVENOX) injection  40 mg Subcutaneous Q24H   feeding supplement (GLUCERNA SHAKE)  237 mL Oral TID BM   insulin aspart  0-15 Units Subcutaneous Q4H   insulin glargine  7 Units Subcutaneous Daily   metoprolol tartrate  50 mg Oral BID   multivitamin  15 mL Oral Daily   OLANZapine zydis  2.5 mg Oral QHS   Continuous Infusions:      Principal Problem:   Bipolar 1 disorder, depressed, full remission (HCC) Active Problems:   Bipolar 1 disorder (HCC)   Hypertension   Generalized weakness   Controlled type 2 diabetes mellitus with hyperglycemia, with long-term current use of insulin (HCC)   Hyperlipidemia   NPH (normal pressure hydrocephalus) (HCC)   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   Protein-calorie malnutrition, severe   Consultants: Psychiatry Neurology Neurosurgery  Procedures: None  Antibiotics: Anti-infectives (From admission, onward)    Start     Dose/Rate Route Frequency Ordered Stop   03/06/21 0900  ceFAZolin (ANCEF) IVPB 1 g/50 mL premix        1 g 100 mL/hr over 30 Minutes Intravenous Every 8 hours 03/06/21 0801 03/10/21 0559   03/04/21 1430  cefTRIAXone (ROCEPHIN) 1 g in sodium chloride 0.9 % 100 mL IVPB  Status:  Discontinued        1 g 200 mL/hr over 30 Minutes Intravenous Every 24 hours 03/04/21 1335 03/06/21 0800   02/14/21  0828  vancomycin (VANCOREADY) IVPB 1000 mg/200 mL  Status:  Discontinued        1,000 mg 200 mL/hr over 60 Minutes Intravenous 60 min pre-op 02/14/21 0828 03/02/21 1139   02/13/21 2115  cefTRIAXone (ROCEPHIN) 1 g in sodium chloride 0.9 % 100 mL IVPB  Status:  Discontinued        1 g 200 mL/hr over 30 Minutes Intravenous Every 24 hours 02/13/21 2016 02/16/21 0844        Time spent: 15 minutes    [name] ANP  [facility] 7 am - 330 pm/M-F for direct patient care and secure chat Please refer to Amion for contact info 85  days

## 2021-04-07 NOTE — Progress Notes (Addendum)
Inpatient Diabetes Program Recommendations  AACE/ADA: New Consensus Statement on Inpatient Glycemic Control (2015)  Target Ranges:  Prepandial:   less than 140 mg/dL      Peak postprandial:   less than 180 mg/dL (1-2 hours)      Critically ill patients:  140 - 180 mg/dL   Lab Results  Component Value Date   GLUCAP 242 (H) 04/07/2021   HGBA1C 8.8 (H) 03/20/2021    Review of Glycemic Control Results for Carolyn Norton, Carolyn Norton (MRN [id number]) as of 04/07/2021 14:20  Ref. Range 04/07/2021 00:59 04/07/2021 02:27 04/07/2021 05:46 04/07/2021 07:49 04/07/2021 11:36  Glucose-Capillary Latest Ref Range: 70 - 99 mg/dL 630 (H) 160 (H) 109 (H) 97 242 (H)   Diabetes history: DM 2 Current orders for Inpatient glycemic control:  Semglee 3 units q AM and 5 units q PM Novolog moderate q 4 hours  Inpatient Diabetes Program Recommendations:    Please consider reducing Novolog correction to sensitive tid with meals and HS scale.  Also please add Novolog meal coverage 2 units tid with meals (hold if patient eats less than 50% or NPO).    Thanks,  Beryl Meager, RN, BC-ADM Inpatient Diabetes Coordinator Pager [phone]  (8a-5p)

## 2021-04-08 ENCOUNTER — Inpatient Hospital Stay (HOSPITAL_COMMUNITY): Payer: Medicaid Other

## 2021-04-08 DIAGNOSIS — R011 Cardiac murmur, unspecified: Secondary | ICD-10-CM

## 2021-04-08 LAB — ECHOCARDIOGRAM COMPLETE
AR max vel: 2.05 cm2
AV Area VTI: 2.18 cm2
AV Area mean vel: 2.1 cm2
AV Mean grad: 5 mmHg
AV Peak grad: 9.5 mmHg
Ao pk vel: 1.54 m/s
Area-P 1/2: 2.95 cm2
Height: 66 in
MV VTI: 2.56 cm2
S' Lateral: 2.4 cm
Weight: 2462.1 oz

## 2021-04-08 LAB — GLUCOSE, CAPILLARY
Glucose-Capillary: 107 mg/dL — ABNORMAL HIGH (ref 70–99)
Glucose-Capillary: 164 mg/dL — ABNORMAL HIGH (ref 70–99)
Glucose-Capillary: 171 mg/dL — ABNORMAL HIGH (ref 70–99)
Glucose-Capillary: 222 mg/dL — ABNORMAL HIGH (ref 70–99)
Glucose-Capillary: 262 mg/dL — ABNORMAL HIGH (ref 70–99)
Glucose-Capillary: 278 mg/dL — ABNORMAL HIGH (ref 70–99)
Glucose-Capillary: 525 mg/dL (ref 70–99)
Glucose-Capillary: 553 mg/dL (ref 70–99)

## 2021-04-08 MED ORDER — INSULIN GLARGINE-YFGN 100 UNIT/ML ~~LOC~~ SOLN
2.0000 [IU] | Freq: Once | SUBCUTANEOUS | Status: AC
  Administered 2021-04-08: 2 [IU] via SUBCUTANEOUS
  Filled 2021-04-08: qty 0.02

## 2021-04-08 MED ORDER — INSULIN ASPART 100 UNIT/ML IJ SOLN
13.0000 [IU] | Freq: Once | INTRAMUSCULAR | Status: AC
  Administered 2021-04-08: 13 [IU] via SUBCUTANEOUS

## 2021-04-08 MED ORDER — INSULIN GLARGINE-YFGN 100 UNIT/ML ~~LOC~~ SOLN
5.0000 [IU] | Freq: Every day | SUBCUTANEOUS | Status: DC
  Administered 2021-04-09 – 2021-04-10 (×2): 5 [IU] via SUBCUTANEOUS
  Filled 2021-04-08 (×2): qty 0.05

## 2021-04-08 MED ORDER — INSULIN GLARGINE-YFGN 100 UNIT/ML ~~LOC~~ SOLN
7.0000 [IU] | Freq: Every day | SUBCUTANEOUS | Status: DC
  Administered 2021-04-08 – 2021-04-09 (×2): 7 [IU] via SUBCUTANEOUS
  Filled 2021-04-08 (×3): qty 0.07

## 2021-04-08 NOTE — Progress Notes (Addendum)
[facility] PROGRESS NOTE  Carolyn Norton ZOX:[id number] DOB: 01-Mar-1966 DOA: 01/11/2021 PCP: Dartha LodgeSteele, Anthony, FNP            March 27, 2021     Status: Remains inpatient appropriate because:Unsafe d/c plan-due to patient's underlying poor mental status short-term memory deficits she will require 24/7 care after discharge  Dispo:  Patient From:  Home  Planned Disposition: SNF w/ Hospice follow up at facility  Medically stable for discharge:  yes  Barriers to DC: Medicaid still pending; prior history of substance abuse and underlying documented bipolar disorder             Difficult to place: Yes  Level of care: Med-Surg  Code Status: DNR Family Communication: Father 7/22 updated regarding likelihood Long COVID neurological syndrome contributing to rapid decline DVT prophylaxis: Lovenox COVID vaccination status: Unknown-post COVID infection 09/06/20   HPI: 55 year old female past medical history of diabetes mellitus type 2, bipolar disorder type I, previous COVID infection, polysubstance abuse that includes cocaine.  She presented from home where she lives with her parents.  Her presenting symptoms included generalized weakness, multiple falls and confusion.  Family confirmed that she had been having the symptoms for at least a month with last use of crack cocaine 1 month prior to presentation.  Ever since that time she had not been doing well.  She would also be having issues with hyperglycemia.  In the ER she was diagnosed with severe dehydration, hyperglycemia and metabolic encephalopathy.  Since admission patient has been evaluated by the psychiatric team who has deemed her alert and oriented and having capacity to make decisions.  Patient is agreeable to placement at a rehab.  Currently she has severe physical deconditioning which was POA and PT is recommending SNF.  Because of her history of polysubstance abuse been difficult to obtain an SNF bed.  Initially her  family is agreeable to taking her back in home when she is able to ambulate well enough to get back and forth for meals into the bathroom.  Unfortunately her cognitive and physical status has progressively declined during this admission.  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  Palliative medicine is continuing to follow along.  Patient's previous advanced directives stated she did not want a PEG tube if she were unable to eat.  Palliative medicine has had multiple meetings with the family on 7/15 decision was made to place cortrack tube for short-term feedings to see if patient demonstrates any improvement in symptoms.   Unfortunately no improvement. Cortrak dc'd. Famliy not ready for comfort measures. Concerned that Carolyn DusterMichelle not eating due to dental pain. Multiple evaluations including imaging w/o abscess. Suspect has cavities which cannot be addressed in the IP setting. It is unlikely this is influencing her intake since her primary complaint for many weeks has been that she is not hungry.  Subjective: Alert and interactive today.  Involved in picking TV channel today and was able to follow commands and lift left leg off bed when asked  Objective: Vitals:   04/07/21 2300 04/08/21 0430  BP: 122/78 (!) 141/71  Pulse: 88 77  Resp: 18 18  Temp: 97.9 F (36.6 C) 98.4 F (36.9 C)  SpO2: 99% 98%    Intake/Output Summary (Last 24 hours) at 04/08/2021 [id number] Last data filed at 04/07/2021 1839 Gross per 24 hour  Intake --  Output 1100 ml  Net -1100 ml  Filed Weights   04/01/21 0736 04/03/21 0500 04/08/21 0413  Weight: 70.6 kg 68.5 kg 69.8 kg    Exam: Constitutional: No acute distress and remains calm with flat affect Respiratory: Lung sounds remain clear and she is stable on room air. Cardiac: Noted with pansystolic murmur 3-4/6 LSB third ICS, no peripheral edema and is normotensive. Abdomen: LBM 7/30,  soft nontender nondistended with normoactive bowel sounds. Neurologic: Cranial nerves are grossly intact.  Not following commands consistently but strength is significantly impaired at 1/5 in the past when she has followed commands Psychiatric: Awake.  Oriented to self only.  Much more alert and interactive today.  Able to follow commands and was watching television and was interactive regarding choice of TV channel.  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   Initially felt to be partially due to NPH physiology given some improved after initial large-volume LP procedure.  Unfortunately repeat large-volume LP without similar results.  MRI of her spine without any abnormalities to explain her progressive lower extremity weakness. Neurology suspects chronic progressive neurological decline secondary to longstanding drug abuse and it is also suspected that Long COVID neurological syndrome contributing.  Repeat EEG 7/25 with moderate diffuse encephalopathy and rpt MRI unchanged from prior in May  Dysphagia/failure to thrive secondary to multifactorial neurological condition No improvement after Megace. No improvement after 10 days of cortrack tube feedings.  No PEG per patient per request.  Family not ready yet for comfort care. 7/27 PMT added low-dose Zyprexa as appetite stimulant but so far unsuccessful  Acute hypoxemic respiratory failure with underlying COPD Stable on room air  Diabetes mellitus 2 with hyperglycemia on insulin Preadmission hemoglobin A1c greater than 11 Continues to have difficulty with managing diabetes.  Variable oral intake contributing and she bounces between 5% and sometimes 60% of meals intake Peak  CBG on 8 09/10/2021 at 3 in the afternoon therefore will increase a.m. Lantus to 5-first dose to be given a.m. 8/3. AM CBG 97 therefore we will continue p.m. Lantus 7  Continue SSI. CBG 12 pm 558 so gave 1x dose of LANTUS 2 UNITS AND nOVOLOG 13 UNITS  Elevated blood pressure/New dx HTN/murmur No prior documented history of hypertension  Continue low-dose Norvasc 8/2 obtain echocardiogram secondary to newly auscultated murmur  Profound physical deconditioning/critical illness myopathy Likely a combination of persistent altered mental status and inability to participate consistently with PT as well as myopathy of critical illness in association with long COVID neurological syndrome  History of bipolar 1 disorder/polysubstance abuse/cognitive impairment Continue to hold home escitalopram, Depakote and Neurontin secondary to persistent altered mentation and bipolar symptoms not present  Ventriculomegaly/? NPH NPH ruled out as above and is likely secondary to atrophy and generalized volume loss  Dyslipidemia Hold statin until oral intake improves  Physical deconditioning/bilateral heel cord shortening Continue bilateral PRAFO boots on every 4 hours and off every 4 hours Given progressive neurological decline unable to participate with therapies    Other problems: GERD Oral PPI on hold while n.p.o.  Hypokalemia/hypomagnesemia Resolved  E. Coli [name] UTI Treated Use purewick only at bedtime    Data Reviewed: Basic Metabolic Panel: Recent Labs  Lab 04/02/21 0143 04/03/21 0141 04/07/21 0146  NA 134* 136 132*  K 3.6 3.4* 4.0  CL 101 100 99  CO2 25 27 24  GLUCOSE 261* 115* 459*  BUN 29* 28* 27*  CREATININE 0.91 0.84 1.15*  CALCIUM 9.6 9.8 9.5  MG 1.7 1.8  --    Liver Function Tests: Recent Labs  Lab 04/02/21 0143 04/03/21 0141  AST 11* 9*  ALT 11 11  ALKPHOS 63 57  BILITOT 0.5 0.4  PROT 6.7 6.5  ALBUMIN 3.0* 3.1*    No results for input(s): LIPASE,  AMYLASE in the last 168 hours. No results for input(s): AMMONIA in the last 168 hours. CBC: Recent Labs  Lab 04/02/21 0143 04/03/21 0141  WBC 11.1* 13.2*  NEUTROABS 5.5 7.3  HGB 11.9* 12.9  HCT 35.9* 38.1  MCV 88.9 88.4  PLT 460* 398   Cardiac Enzymes: No results for input(s): CKTOTAL, CKMB, CKMBINDEX, TROPONINI in the last 168 hours. BNP (last 3 results) Recent Labs    03/24/21 0856 03/25/21 0203 03/26/21 0222  BNP 29.2 33.9 37.9    ProBNP (last 3 results) No results for input(s): PROBNP in the last 8760 hours.  CBG: Recent Labs  Lab 04/07/21 1455 04/07/21 1946 04/08/21 0102 04/08/21 0407 04/08/21 0756  GLUCAP 423* 304* 164* 262* 222*    No results found for this or any previous visit (from the past 240 hour(s)).     Studies: No results found.  Scheduled Meds:  (feeding supplement) PROSource Plus  30 mL Oral TID BM   amLODipine  5 mg Oral Daily   enoxaparin (LOVENOX) injection  40 mg Subcutaneous Q24H   feeding supplement (GLUCERNA SHAKE)  237 mL Oral TID BM   insulin aspart  0-9 Units Subcutaneous Q4H   insulin glargine-yfgn  7 Units Subcutaneous QHS   And   insulin glargine-yfgn  3 Units Subcutaneous Daily   metoprolol tartrate  50 mg Oral BID   multivitamin  15 mL Oral Daily   OLANZapine zydis  2.5 mg Oral QHS   Continuous Infusions:      Principal Problem:   Bipolar 1 disorder, depressed, full remission (HCC) Active Problems:   Bipolar 1 disorder (HCC)   Hypertension   Generalized weakness   Controlled type 2 diabetes mellitus with hyperglycemia, with long-term current use of insulin (HCC)   Hyperlipidemia   NPH (normal pressure hydrocephalus) (HCC)   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   Protein-calorie malnutrition, severe   Consultants: Psychiatry Neurology Neurosurgery  Procedures: None  Antibiotics: Anti-infectives (From admission, onward)    Start     Dose/Rate Route Frequency Ordered Stop   03/06/21 0900  ceFAZolin  (ANCEF) IVPB 1 g/50 mL premix        1 g 100 mL/hr over 30 Minutes Intravenous Every 8 hours 03/06/21 0801 03/10/21 0559   03/04/21 1430  cefTRIAXone (ROCEPHIN) 1 g in sodium chloride 0.9 % 100 mL IVPB  Status:  Discontinued        1 g 200 mL/hr over 30 Minutes Intravenous Every 24 hours 03/04/21 1335 03/06/21 0800   02/14/21 0828  vancomycin (VANCOREADY) IVPB 1000 mg/200 mL  Status:  Discontinued        1,000 mg 200 mL/hr over 60 Minutes Intravenous 60 min pre-op 02/14/21 0828 03/02/21 1139   02/13/21 2115  cefTRIAXone (ROCEPHIN) 1 g in sodium chloride 0.9 % 100 mL IVPB  Status:  Discontinued        1 g 200 mL/hr over 30 Minutes Intravenous Every 24 hours 02/13/21 2016 02/16/21 0844        Time spent: 15 minutes    [name] ANP  [facility] 7 am - 330 pm/M-F for direct patient care and secure chat Please refer to Amion for contact info 86  days

## 2021-04-08 NOTE — Progress Notes (Signed)
  Echocardiogram 2D Echocardiogram has been performed.  [name] Diss 04/08/2021, 5:22 PM

## 2021-04-08 NOTE — Progress Notes (Signed)
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  ongoing  GOAL:   Patient will meet greater than or equal to 90% of their needs  progressing  MONITOR:   PO intake, Supplement acceptance, Labs, Weight trends, TF tolerance  REASON FOR ASSESSMENT:   Consult Calorie Count, Poor PO  ASSESSMENT:   55 year old female past medical history of diabetes mellitus type 2, bipolar disorder type I, previous COVID infection, polysubstance abuse that includes cocaine.  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 Pt has been at [facility] for the past fifty-three days is not eating or drinking sufficiently. Palliative care has been asked to get re-involved to further discuss goals of care in the setting of progressive failure to thrive and generalized weakness.  7/04 - megace initiated 7/09 - megace dose increased to BID 7/15 - cortrak placed (tip gastric per xray) 7/23 - cortrak removed 7/25 repeat EEG with moderate diffuse encephalopathy though no evidence of seizures or other abnormality noted  PMT has signed off. Per RN, pt eating ~25% of most meals but is doing well with Glucerna shakes/supplements. Pt/family do not want further trials of TF. Pt still pending placement.  PO Intake: 0-60% x last 8 meal completions (~29% average meal  intake)  Medications: Scheduled Meds:  (feeding supplement) PROSource Plus  30 mL Oral TID BM   amLODipine  5 mg Oral Daily   enoxaparin (LOVENOX) injection  40 mg Subcutaneous Q24H   feeding supplement (GLUCERNA SHAKE)  237 mL Oral TID BM   insulin aspart  0-9 Units Subcutaneous Q4H   [START ON 04/09/2021] insulin glargine-yfgn  5 Units Subcutaneous Daily   And   insulin glargine-yfgn  7 Units Subcutaneous QHS   metoprolol tartrate  50 mg Oral BID   multivitamin  15 mL Oral Daily   OLANZapine zydis  2.5 mg Oral QHS   Labs: Recent Labs  Lab 04/02/21 0143 04/03/21 0141 04/07/21 0146  NA 134* 136 132*  K 3.6 3.4* 4.0  CL 101 100 99  CO2 25 27 24   BUN 29* 28* 27*  CREATININE 0.91 0.84 1.15*  CALCIUM 9.6 9.8 9.5  MG 1.7 1.8  --   GLUCOSE 261* 115* 459*  CBGs [phone]  UOP: [birth year]1982 x24 hours  Admit weight: 67.5 kg Current weight: 69.8 kg  Diet Order:   Diet Order             DIET DYS 2 Room service appropriate? No; Fluid consistency: Thin  Diet effective now                   EDUCATION NEEDS:   No education needs have been identified at this time  Skin:  Skin Assessment: Reviewed RN Assessment  Last BM:  7/30  Height:   Ht Readings from Last 1 Encounters:  03/15/21 5\' 6"  (1.676 m)    Weight:   Wt Readings from Last 1 Encounters:  04/08/21 69.8 kg  Ideal Body Weight:  59.1 kg  BMI:  Body mass index is 24.84 kg/m.  Estimated Nutritional Needs:   Kcal:  1850-2050  Protein:  90-110 grams  Fluid:  >/= 1.8 L    Eugene Gavia, MS, RD, LDN (she/her/hers) RD pager number and weekend/on-call pager number located in Amion.

## 2021-04-08 NOTE — Progress Notes (Addendum)
CSW made attempt to contact Kitty at [facility] without success - a voicemail was left requesting a return call.  CSW spoke with Lowella Bandy at [facility] who states the facility does not have any beds at this time.  Edwin Dada, MSW, LCSW Transitions of Care  Clinical Social Worker II [phone]

## 2021-04-09 LAB — GLUCOSE, CAPILLARY
Glucose-Capillary: 105 mg/dL — ABNORMAL HIGH (ref 70–99)
Glucose-Capillary: 148 mg/dL — ABNORMAL HIGH (ref 70–99)
Glucose-Capillary: 170 mg/dL — ABNORMAL HIGH (ref 70–99)
Glucose-Capillary: 200 mg/dL — ABNORMAL HIGH (ref 70–99)
Glucose-Capillary: 298 mg/dL — ABNORMAL HIGH (ref 70–99)
Glucose-Capillary: 334 mg/dL — ABNORMAL HIGH (ref 70–99)
Glucose-Capillary: 374 mg/dL — ABNORMAL HIGH (ref 70–99)

## 2021-04-09 NOTE — Progress Notes (Addendum)
4pm: CSW spoke with Velna Hatchet of [facility] who states she will come visit the patient in the hospital tomorrow morning for an assessment.  10am: CSW spoke with [place] [employer] [place] at [place] who states she will pass the referral on to the [facility] for further review.  Edwin Dada, MSW, LCSW Transitions of Care  Clinical Social Worker II [phone]

## 2021-04-09 NOTE — Progress Notes (Signed)
[facility] PROGRESS NOTE  Carolyn Norton HKV:[id number] DOB: [birth year] DOA: 01/11/2021 PCP: [name], FNP            March 27, 2021     Status: Remains inpatient appropriate because:Unsafe d/c plan-due to patient's underlying poor mental status short-term memory deficits she will require 24/7 care after discharge  Dispo:  Patient From:  Home  Planned Disposition: SNF w/ Hospice follow up at facility  Medically stable for discharge:  yes  Barriers to DC: Medicaid still pending; prior history of substance abuse and underlying documented bipolar disorder             Difficult to place: Yes  Level of care: Med-Surg  Code Status: DNR Family Communication: Father 8/03.  Updated on patient's improved mental status and suspicions that the low-dose Zyprexa that was initiated for anorexia has actually improved her mental status.  Explained that we are not certain what is the cause of her changes but clearly this medication appears to be improving her overall alertness. DVT prophylaxis: Lovenox COVID vaccination status: Unknown-post COVID infection 09/06/20   HPI: 55 year old female past medical history of diabetes mellitus type 2, bipolar disorder type I, previous COVID infection, polysubstance abuse that includes cocaine.  She presented from home where she lives with her parents.  Her presenting symptoms included generalized weakness, multiple falls and confusion.  Family confirmed that she had been having the symptoms for at least a month with last use of crack cocaine 1 month prior to presentation.  Ever since that time she had not been doing well.  She would also be having issues with hyperglycemia.  In the ER she was diagnosed with severe dehydration, hyperglycemia and metabolic encephalopathy.  Since admission patient has been evaluated by the psychiatric team who has deemed her alert and oriented and having capacity to make decisions.  Patient is agreeable to  placement at a rehab.  Currently she has severe physical deconditioning which was POA and PT is recommending SNF.  Because of her history of polysubstance abuse been difficult to obtain an SNF bed.  Initially her family is agreeable to taking her back in home when she is able to ambulate well enough to get back and forth for meals into the bathroom.  Unfortunately her cognitive and physical status has progressively declined during this admission.  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  Palliative medicine is continuing to follow along.  Patient's previous advanced directives stated she did not want a PEG tube if she were unable to eat.  Palliative medicine has had multiple meetings with the family on 7/15 decision was made to place cortrack tube for short-term feedings to see if patient demonstrates any improvement in symptoms.   Unfortunately no improvement. Cortrak dc'd. Famliy not ready for comfort measures. Concerned that Carolyn Norton not eating due to dental pain. Multiple evaluations including imaging w/o abscess. Suspect has cavities which cannot be addressed in the IP setting. It is unlikely this is influencing her intake since her primary complaint for many weeks has been that she is not hungry.  Beginning on 8/2 patient had improved mentation.  Was more conversant and was able to consistently follow commands.  It is suspected that the Zyprexa that she was recently started on for her anorexia has actually improved her overall mentation.  Subjective: Awake, pleasant, watching TV.  Conversant  Objective: Vitals:   04/08/21 2040 04/09/21 0800  BP:  118/62 122/72  Pulse: 83 79  Resp: 18 18  Temp: 98.5 F (36.9 C) 98 F (36.7 C)  SpO2: 100% 98%    Intake/Output Summary (Last 24 hours) at 04/09/2021 0812 Last data filed at 04/09/2021 0601 Gross per 24 hour  Intake --  Output 900 ml  Net -900 ml    Filed  Weights   04/01/21 0736 04/03/21 0500 04/08/21 0413  Weight: 70.6 kg 68.5 kg 69.8 kg    Exam: Constitutional: Awake and in no acute distress. Respiratory: Anterior lung sounds remain clear, stable on room Cardiac: Previous murmur not auscultated today, no peripheral edema, regular pulse and normotensive. Abdomen: LBM 7/30, soft with normoactive bowel sounds.  Variable oral intake averaging between 50 and 60% of meals and at times down to as low as 5%.  Intake also not distantly document Neurologic: Cranial nerves II through XII grossly intact.  Strength is about 2/5 in both upper and lower extremities.  She is consistently following simple commands today.  More involved in conversation.  Watching TV. Psychiatric: Alert and oriented times name only.  Was able to attempt to answer orientation questions even though she did not get MRI.  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   Initially felt to be partially due to NPH physiology given some improved after initial large-volume LP procedure.  Unfortunately repeat large-volume LP without similar results.  MRI of her spine without any abnormalities to explain her progressive lower extremity weakness. Neurology suspects chronic progressive neurological decline secondary to longstanding drug abuse and it is also suspected that Long COVID neurological syndrome contributing.  Repeat EEG 7/25 with moderate diffuse encephalopathy and rpt MRI unchanged from prior in May As of 8/2 patient has had significantly improved neurological status with increased alertness, consistent following commands, more engagement in conversation and activities in room.  It is  suspected that low-dose Zyprexa initiated for anorexia may be contributing to this improvement.  Mechanism of action for Zyprexa in general is unknown but it is suspected that antagonizes dopamine, serotonin 5-HD 2 and other receptors.  Dysphagia/failure to thrive secondary to multifactorial neurological condition No improvement after Megace. No improvement after 10 days of cortrack tube feedings.  No PEG per patient per request.  Family not ready yet for comfort care. 7/27 PMT added low-dose Zyprexa as appetite stimulant but so far unsuccessful  Acute hypoxemic respiratory failure with underlying COPD Stable on room air  Diabetes mellitus 2 with hyperglycemia on insulin Preadmission hemoglobin A1c greater than 11 Continues to have difficulty with managing diabetes.  Variable oral intake contributing and she bounces between 5% and sometimes 60% of meals intake CBGs better controlled after yesterdays adjustments. Cont  a.m. Lantus to 5 and pm Lantus 7  Continue SSI.  Elevated blood pressure/New dx HTN/murmur No prior documented history of hypertension  Continue low-dose Norvasc 8/2 obtain echocardiogram secondary to newly auscultated murmur; no valvular abnormalities but was c/w diastolic dysfunction Murmur likely 2/2 hyperdynamic state  Profound physical deconditioning/critical illness myopathy Likely a combination of persistent altered mental status and inability to participate consistently with PT as well as myopathy of critical illness in association with long COVID neurological syndrome  History of bipolar 1 disorder/polysubstance abuse/cognitive impairment Continue to hold home escitalopram, Depakote and Neurontin secondary to persistent altered mentation and  bipolar symptoms not present Given improvement in mental status will continue Zyprexa low-dose to treat her bipolar disorder.  Possibly she had an atypical presentation of catatonia type mental status change which seems to be  responding to Zyprexa  Ventriculomegaly/? NPH NPH ruled out as above and is likely secondary to atrophy and generalized volume loss  Dyslipidemia Hold statin until oral intake improves  Physical deconditioning/bilateral heel cord shortening Continue bilateral PRAFO boots on every 4 hours and off every 4 hours Given progressive neurological decline unable to participate with therapies    Other problems: GERD Oral PPI on hold while n.p.o.  Hypokalemia/hypomagnesemia Resolved  E. Coli [name] UTI Treated Use purewick only at bedtime    Data Reviewed: Basic Metabolic Panel: Recent Labs  Lab 04/03/21 0141 04/07/21 0146  NA 136 132*  K 3.4* 4.0  CL 100 99  CO2 27 24  GLUCOSE 115* 459*  BUN 28* 27*  CREATININE 0.84 1.15*  CALCIUM 9.8 9.5  MG 1.8  --    Liver Function Tests: Recent Labs  Lab 04/03/21 0141  AST 9*  ALT 11  ALKPHOS 57  BILITOT 0.4  PROT 6.5  ALBUMIN 3.1*    No results for input(s): LIPASE, AMYLASE in the last 168 hours. No results for input(s): AMMONIA in the last 168 hours. CBC: Recent Labs  Lab 04/03/21 0141  WBC 13.2*  NEUTROABS 7.3  HGB 12.9  HCT 38.1  MCV 88.4  PLT 398   Cardiac Enzymes: No results for input(s): CKTOTAL, CKMB, CKMBINDEX, TROPONINI in the last 168 hours. BNP (last 3 results) Recent Labs    03/24/21 0856 03/25/21 0203 03/26/21 0222  BNP 29.2 33.9 37.9    ProBNP (last 3 results) No results for input(s): PROBNP in the last 8760 hours.  CBG: Recent Labs  Lab 04/08/21 1651 04/08/21 1958 04/08/21 2352 04/09/21 0402 04/09/21 0719  GLUCAP 278* 107* 171* 170* 148*    No results found for this or any previous visit (from the past 240 hour(s)).     Studies: ECHOCARDIOGRAM COMPLETE  Result Date: 04/08/2021    ECHOCARDIOGRAM REPORT   Patient Name:   PATRYCJA MUMPOWER Date of Exam: 04/08/2021 Medical Rec #:  [id number]            Height:       66.0 in Accession #:    [id number]           Weight:        153.9 lb Date of Birth:  1966/06/07           BSA:          1.789 m Patient Age:    54 years             BP:           120/62 mmHg Patient Gender: F                    HR:           79 bpm. Exam Location:  Inpatient Procedure: Color Doppler and Cardiac Doppler Indications:    Murmur  History:        Patient has no prior history of Echocardiogram examinations.                 COPD; Risk Factors:Diabetes. H/O cocaine abuse.  Sonographer:    Ross Ludwig RDCS (AE) Referring Phys: [id number] Russella Dar  Sonographer Comments: No IV for Definity use. IMPRESSIONS  1. Left  ventricular ejection fraction, by estimation, is 60 to 65%. The left ventricle has normal function. The left ventricle has no regional wall motion abnormalities. There is mild concentric left ventricular hypertrophy. Left ventricular diastolic parameters are consistent with Grade I diastolic dysfunction (impaired relaxation). Elevated left atrial pressure.  2. Right ventricular systolic function is normal. The right ventricular size is normal. Tricuspid regurgitation signal is inadequate for assessing PA pressure.  3. The mitral valve is normal in structure. No evidence of mitral valve regurgitation. No evidence of mitral stenosis.  4. The aortic valve is normal in structure. Aortic valve regurgitation is not visualized. No aortic stenosis is present.  5. The inferior vena cava is normal in size with greater than 50% respiratory variability, suggesting right atrial pressure of 3 mmHg. FINDINGS  Left Ventricle: Left ventricular ejection fraction, by estimation, is 60 to 65%. The left ventricle has normal function. The left ventricle has no regional wall motion abnormalities. The left ventricular internal cavity size was normal in size. There is  mild concentric left ventricular hypertrophy. Left ventricular diastolic parameters are consistent with Grade I diastolic dysfunction (impaired relaxation). Elevated left atrial pressure. Right Ventricle: The right  ventricular size is normal. No increase in right ventricular wall thickness. Right ventricular systolic function is normal. Tricuspid regurgitation signal is inadequate for assessing PA pressure. Left Atrium: Left atrial size was normal in size. Right Atrium: Right atrial size was normal in size. Pericardium: There is no evidence of pericardial effusion. Mitral Valve: The mitral valve is normal in structure. Mild mitral annular calcification. No evidence of mitral valve regurgitation. No evidence of mitral valve stenosis. MV peak gradient, 3.6 mmHg. The mean mitral valve gradient is 2.0 mmHg. Tricuspid Valve: The tricuspid valve is normal in structure. Tricuspid valve regurgitation is not demonstrated. No evidence of tricuspid stenosis. Aortic Valve: The aortic valve is normal in structure. Aortic valve regurgitation is not visualized. No aortic stenosis is present. Aortic valve mean gradient measures 5.0 mmHg. Aortic valve peak gradient measures 9.5 mmHg. Aortic valve area, by VTI measures 2.18 cm. Pulmonic Valve: The pulmonic valve was normal in structure. Pulmonic valve regurgitation is not visualized. No evidence of pulmonic stenosis. Aorta: The aortic root is normal in size and structure. Venous: The inferior vena cava is normal in size with greater than 50% respiratory variability, suggesting right atrial pressure of 3 mmHg. IAS/Shunts: No atrial level shunt detected by color flow Doppler.  LEFT VENTRICLE PLAX 2D LVIDd:         3.60 cm  Diastology LVIDs:         2.40 cm  LV e' medial:    5.12 cm/s LV PW:         1.30 cm  LV E/e' medial:  14.6 LV IVS:        1.40 cm  LV e' lateral:   4.56 cm/s LVOT diam:     1.80 cm  LV E/e' lateral: 16.4 LV SV:         68 LV SV Index:   38 LVOT Area:     2.54 cm  RIGHT VENTRICLE RV Basal diam:  2.90 cm RV S prime:     9.12 cm/s LEFT ATRIUM             Index       RIGHT ATRIUM           Index LA diam:        2.70 cm 1.51 cm/m  RA Area:  10.20 cm LA Vol (A2C):   32.5 ml  18.17 ml/m RA Volume:   21.40 ml  11.96 ml/m LA Vol (A4C):   23.8 ml 13.30 ml/m LA Biplane Vol: 29.8 ml 16.66 ml/m  AORTIC VALVE AV Area (Vmax):    2.05 cm AV Area (Vmean):   2.10 cm AV Area (VTI):     2.18 cm AV Vmax:           154.00 cm/s AV Vmean:          111.000 cm/s AV VTI:            0.314 m AV Peak Grad:      9.5 mmHg AV Mean Grad:      5.0 mmHg LVOT Vmax:         124.00 cm/s LVOT Vmean:        91.400 cm/s LVOT VTI:          0.269 m LVOT/AV VTI ratio: 0.86  AORTA Ao Root diam: 2.80 cm Ao Asc diam:  3.00 cm MITRAL VALVE               TRICUSPID VALVE MV Area (PHT): 2.95 cm    TR Peak grad:   6.1 mmHg MV Area VTI:   2.56 cm    TR Vmax:        123.00 cm/s MV Peak grad:  3.6 mmHg MV Mean grad:  2.0 mmHg    SHUNTS MV Vmax:       0.95 m/s    Systemic VTI:  0.27 m MV Vmean:      65.7 cm/s   Systemic Diam: 1.80 cm MV Decel Time: 257 msec MV E velocity: 74.80 cm/s MV A velocity: 86.40 cm/s MV E/A ratio:  0.87 Mihai Croitoru MD Electronically signed by Thurmon Fair MD Signature Date/Time: 04/08/2021/5:25:58 PM    Final     Scheduled Meds:  (feeding supplement) PROSource Plus  30 mL Oral TID BM   amLODipine  5 mg Oral Daily   enoxaparin (LOVENOX) injection  40 mg Subcutaneous Q24H   feeding supplement (GLUCERNA SHAKE)  237 mL Oral TID BM   insulin aspart  0-9 Units Subcutaneous Q4H   insulin glargine-yfgn  5 Units Subcutaneous Daily   And   insulin glargine-yfgn  7 Units Subcutaneous QHS   metoprolol tartrate  50 mg Oral BID   multivitamin  15 mL Oral Daily   OLANZapine zydis  2.5 mg Oral QHS   Continuous Infusions:      Principal Problem:   Bipolar 1 disorder, depressed, full remission (HCC) Active Problems:   Bipolar 1 disorder (HCC)   Hypertension   Generalized weakness   Controlled type 2 diabetes mellitus with hyperglycemia, with long-term current use of insulin (HCC)   Hyperlipidemia   NPH (normal pressure hydrocephalus) (HCC)   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    Protein-calorie malnutrition, severe   Consultants: Psychiatry Neurology Neurosurgery  Procedures: None  Antibiotics: Anti-infectives (From admission, onward)    Start     Dose/Rate Route Frequency Ordered Stop   03/06/21 0900  ceFAZolin (ANCEF) IVPB 1 g/50 mL premix        1 g 100 mL/hr over 30 Minutes Intravenous Every 8 hours 03/06/21 0801 03/10/21 0559   03/04/21 1430  cefTRIAXone (ROCEPHIN) 1 g in sodium chloride 0.9 % 100 mL IVPB  Status:  Discontinued        1 g 200 mL/hr over 30 Minutes Intravenous Every 24 hours 03/04/21 1335 03/06/21  0800   02/14/21 0828  vancomycin (VANCOREADY) IVPB 1000 mg/200 mL  Status:  Discontinued        1,000 mg 200 mL/hr over 60 Minutes Intravenous 60 min pre-op 02/14/21 0828 03/02/21 1139   02/13/21 2115  cefTRIAXone (ROCEPHIN) 1 g in sodium chloride 0.9 % 100 mL IVPB  Status:  Discontinued        1 g 200 mL/hr over 30 Minutes Intravenous Every 24 hours 02/13/21 2016 02/16/21 0844        Time spent: 15 minutes    Junious SilkAllison Synda Bagent ANP  [facility] 7 am - 330 pm/M-F for direct patient care and secure chat Please refer to Amion for contact info 87  days

## 2021-04-09 NOTE — Evaluation (Signed)
Occupational Therapy Evaluation Patient Details Name: Carolyn Norton MRN: [id number] DOB: 1966-05-04 Today's Date: 04/09/2021    History of Present Illness Pt is a 56 y.o. female initially admitted nearly 3 months ago on 01/11/21 with multiple falls, AMS, weakness. Workup for severe dehydration, DKA and metabolic encephalopathy. 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 7/1 underwent lumbar puncture and drained large volume of CSF to assess for potential improvement in symptomology (none noted);  7/2 MRI of the brain, cervical, thoracic and lumbar spine with no acute changes to explain LE weakness. OT signed off 7/13 secondary to lack of progress and further cognitive/functional decline. Cortrak placed 7/15 secondary to poor [email or web address]. Family declining comfort measures. On 8/2 patient with increased alertness per MD after beginning medication for anorexia. New OT order placed. PMH includes DM2, COPD, CKD3, GERD, bipolar disorder, COVID-19, polysubstance abuse including cocaine.   Clinical Impression   Patient presenting for OT eval after previous d/c from services on 7/13 secondary to poor progress. Reported improvement in alertness after start of new medication for anorexia as noted above. Patient with noted improved alertness at time of evaluation but followed 5/25 verbal commands. A&O to self only. Patient also continues to require up to Total A +2 for bed mobility and toileitng at bed level this date. Patient did however hold ice cream with LUE and use spoon to scoop and bring bites to mouth with cues for initiation/continuation. Patient with waxing/waning command following with periods of noted blank stare up and to the R. OT to pick patient up for 1x weekly trial to further assess rehab potential.     Follow Up Recommendations  Other (comment);SNF (Long-term/custodial care)    Equipment Recommendations  Other (comment) (Defer to next level of care)     Recommendations for Other Services       Precautions / Restrictions Precautions Precautions: Fall Precaution Comments: High fall risk Restrictions Weight Bearing Restrictions: No      Mobility Bed Mobility Overal bed mobility: Needs Assistance   Rolling: Total assist;+2 for physical assistance;+2 for safety/equipment         General bed mobility comments: Heavy total A +2 for all aspects of bed mobility.    Transfers Overall transfer level: Needs assistance               General transfer comment: Deferred    Balance                                           ADL either performed or assessed with clinical judgement   ADL Overall ADL's : Needs assistance/impaired Eating/Feeding: Maximal assistance;Total assistance;Bed level Eating/Feeding Details (indicate cue type and reason): Initially total A to "prime the pump". Progress to patient holding ice cream with L hand and scooping/bring spoon to mouth with dominant R hand.             Upper Body Dressing : Total assistance;Bed level   Lower Body Dressing: Total assistance;Bed level       Toileting- Clothing Manipulation and Hygiene: Total assistance;Bed level         General ADL Comments: Dependent     Vision Baseline Vision/History: Wears glasses Vision Assessment?: No apparent visual deficits     Perception     Praxis      Pertinent Vitals/Pain Pain Assessment: Faces Faces Pain Scale: Hurts a little bit  Pain Location: R arm with movement Pain Descriptors / Indicators: Discomfort;Grimacing;Guarding Pain Intervention(s): Limited activity within patient's tolerance;Monitored during session;Repositioned     Hand Dominance Right   Extremity/Trunk Assessment Upper Extremity Assessment Upper Extremity Assessment: Generalized weakness RUE Deficits / Details: Minimal spontaneous movement noted  at start of session; did not follow commands necessary for formal assessment. Noted  pain with movement of R shoulder. Later in session patient able to take spoon from therapist and use it to scoop several bites of ice cream and bring to mouth. LUE Deficits / Details: Minimal spontaneous movement noted  at start of session; did not follow commands necessary for formal assessment. Noted pain with movement of R shoulder. Later in the session patient able to hold ice cream in L hand.           Communication Communication Communication: No difficulties   Cognition Arousal/Alertness: Awake/alert Behavior During Therapy: Flat affect Overall Cognitive Status: Difficult to assess Area of Impairment: Attention;Following commands;Problem solving                 Orientation Level: Disoriented to;Time;Place;Situation Current Attention Level: Focused Memory: Decreased short-term memory;Decreased recall of precautions Following Commands: Follows one step commands inconsistently Safety/Judgement: Decreased awareness of deficits;Decreased awareness of safety Awareness: Intellectual Problem Solving: Slow processing;Decreased initiation;Difficulty sequencing;Requires verbal cues;Requires tactile cues General Comments: Asleep upon arrival but easily aroused. Patient alert and oriented to person only. Responds yes/no/"what" intermittently. Intermittent blank stare up and to the R througout session. Patient follows 5/25 verbal commands throughout evaluation. Periods of increased command following followed quickly by periods of no command following at all. Continued waxing/waning cognition with patient still greatly impaired during more "alert" periods. Does not respond when asked to count backwards for 20 to 1 or when asked what her favorite color is.   General Comments       Exercises     Shoulder Instructions      Home Living Family/patient expects to be discharged to:: Skilled nursing facility                                        Prior  Functioning/Environment Level of Independence: Independent with assistive device(s)        Comments: Patient independent and working prior to 10/2020. Per PT evaluation, patient with recent decline in function requiring assist from mother with ADLs. Patient has also been using RW.        OT Problem List: Decreased strength;Decreased activity tolerance;Impaired balance (sitting and/or standing);Decreased cognition;Decreased safety awareness;Decreased knowledge of use of DME or AE      OT Treatment/Interventions: Self-care/ADL training;Therapeutic exercise;Energy conservation;DME and/or AE instruction;Therapeutic activities;Patient/family education;Balance training    OT Goals(Current goals can be found in the care plan section) Acute Rehab OT Goals Patient Stated Goal: None stated. OT Goal Formulation: Patient unable to participate in goal setting Time For Goal Achievement: 04/23/21 Potential to Achieve Goals: Poor ADL Goals Pt Will Perform Eating: with set-up;sitting Pt Will Perform Grooming: with set-up;sitting Pt Will Perform Upper Body Dressing: with min assist;sitting Additional ADL Goal #1: Patietn will follow 1-step verbal commands with 95% accuracy in prep for ADLs at bed level vs. in sitting.  OT Frequency: Min 1X/week   Barriers to D/C: Inaccessible home environment;Decreased caregiver support          Co-evaluation  AM-PAC OT "6 Clicks" Daily Activity     Outcome Measure Help from another person eating meals?: A Lot Help from another person taking care of personal grooming?: Total Help from another person toileting, which includes using toliet, bedpan, or urinal?: Total Help from another person bathing (including washing, rinsing, drying)?: Total Help from another person to put on and taking off regular upper body clothing?: Total Help from another person to put on and taking off regular lower body clothing?: Total 6 Click Score: 7   End of Session  Nurse Communication: Other (comment) (Response to treatment)  Activity Tolerance: Other (comment) (Limited participation) Patient left: in bed;with call bell/phone within reach;with bed alarm set  OT Visit Diagnosis: Unsteadiness on feet (R26.81);Other abnormalities of gait and mobility (R26.89);Muscle weakness (generalized) (M62.81);Other symptoms and signs involving cognitive function;Other symptoms and signs involving the nervous system (R29.898)                Time: 1300-1330 OT Time Calculation (min): 30 min Charges:  OT General Charges $OT Visit: 1 Visit OT Evaluation $OT Eval Moderate Complexity: 1 Mod  Timarie Labell H. OTR/L Supplemental OT, Department of rehab services [phone]  Thurmond Hildebran R H. 04/09/2021, 1:55 PM

## 2021-04-09 NOTE — Progress Notes (Signed)
Inpatient Diabetes Program Recommendations  AACE/ADA: New Consensus Statement on Inpatient Glycemic Control (2015)  Target Ranges:  Prepandial:   less than 140 mg/dL      Peak postprandial:   less than 180 mg/dL (1-2 hours)      Critically ill patients:  140 - 180 mg/dL   Lab Results  Component Value Date   GLUCAP 374 (H) 04/09/2021   HGBA1C 8.8 (H) 03/20/2021    Review of Glycemic Control  Diabetes history: DM2  Current orders for Inpatient glycemic control: Semglee 5 units QD, 7 units QHS, Novolog 0-9 units Q4H  Post-prandials still elevated.  Inpatient Diabetes Program Recommendations:    Add Novolog 2 units TID with meals if eating > 50%.  Continue to follow.  Thank you. Ailene Ards, RD, LDN, CDE Inpatient Diabetes Coordinator [phone]

## 2021-04-10 LAB — GLUCOSE, CAPILLARY
Glucose-Capillary: 120 mg/dL — ABNORMAL HIGH (ref 70–99)
Glucose-Capillary: 237 mg/dL — ABNORMAL HIGH (ref 70–99)
Glucose-Capillary: 303 mg/dL — ABNORMAL HIGH (ref 70–99)
Glucose-Capillary: 335 mg/dL — ABNORMAL HIGH (ref 70–99)
Glucose-Capillary: 459 mg/dL — ABNORMAL HIGH (ref 70–99)
Glucose-Capillary: 94 mg/dL (ref 70–99)

## 2021-04-10 MED ORDER — MAGNESIUM HYDROXIDE 400 MG/5ML PO SUSP
30.0000 mL | Freq: Once | ORAL | Status: AC
  Administered 2021-04-10: 30 mL via ORAL
  Filled 2021-04-10: qty 30

## 2021-04-10 MED ORDER — INSULIN GLARGINE-YFGN 100 UNIT/ML ~~LOC~~ SOLN
10.0000 [IU] | Freq: Every day | SUBCUTANEOUS | Status: DC
  Administered 2021-04-10 – 2021-04-11 (×2): 10 [IU] via SUBCUTANEOUS
  Filled 2021-04-10 (×3): qty 0.1

## 2021-04-10 MED ORDER — INSULIN GLARGINE-YFGN 100 UNIT/ML ~~LOC~~ SOLN
5.0000 [IU] | Freq: Every day | SUBCUTANEOUS | Status: DC
  Administered 2021-04-11 – 2021-04-12 (×2): 5 [IU] via SUBCUTANEOUS
  Filled 2021-04-10 (×2): qty 0.05

## 2021-04-10 MED ORDER — INSULIN ASPART 100 UNIT/ML IJ SOLN
14.0000 [IU] | Freq: Once | INTRAMUSCULAR | Status: AC
  Administered 2021-04-10: 14 [IU] via SUBCUTANEOUS

## 2021-04-10 MED ORDER — OLANZAPINE 5 MG PO TBDP
5.0000 mg | ORAL_TABLET | Freq: Every day | ORAL | Status: DC
  Administered 2021-04-10 – 2021-04-27 (×18): 5 mg via ORAL
  Filled 2021-04-10 (×18): qty 1

## 2021-04-10 NOTE — Evaluation (Signed)
Physical Therapy Evaluation Patient Details Name: Carolyn Norton MRN: [id number] DOB: 10/27/1965 Today's Date: 04/10/2021   History of Present Illness  Pt is a 55 y.o. female initially admitted nearly 3 months ago on 01/11/21 with multiple falls, AMS, weakness. Workup for severe dehydration, DKA and metabolic encephalopathy. 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 7/1 underwent lumbar puncture and drained large volume of CSF to assess for potential improvement in symptomology (none noted);  7/2 MRI of the brain, cervical, thoracic and lumbar spine with no acute changes to explain LE weakness. OT signed off 7/13 secondary to lack of progress and further cognitive/functional decline. Cortrak placed 7/15 secondary to poor [email or web address]. Family declining comfort measures. On 8/2 patient with increased alertness per MD after beginning medication for anorexia. New OT order placed. PMH includes DM2, COPD, CKD3, GERD, bipolar disorder, COVID-19, polysubstance abuse including cocaine.  Clinical Impression   Pt admitted secondary to problem above with deficits below. PTA patient was ambulatory and living with her parents. Pt currently requires total assist of 2 for functional mobility (except for rolling which requires +1 total assist). Patient is much more awake and following commands. Anticipate patient may benefit from PT to address problems listed below.Will continue to follow acutely for a trial of 2 weeks to assess ability to participate and make progress.      Follow Up Recommendations SNF (potentially depending on how pt responds to trial of PT x 2 weeks)    Equipment Recommendations  Hospital bed;Wheelchair (measurements PT);Other (comment);Wheelchair cushion (measurements PT) (hoyer lift and pads)    Recommendations for Other Services       Precautions / Restrictions Precautions Precautions: Fall Precaution Comments: High fall risk      Mobility  Bed  Mobility Overal bed mobility: Needs Assistance Bed Mobility: Rolling Rolling: Total assist         General bed mobility comments: Once rolling initiated, pt reaching towards bed rail and ultimately grabbed it but with poor ability to pull herself over    Transfers                    Ambulation/Gait                Stairs            Wheelchair Mobility    Modified Rankin (Stroke Patients Only)       Balance                                             Pertinent Vitals/Pain Pain Assessment: Faces    Home Living Family/patient expects to be discharged to:: Other (Comment) (long-term care vs SNF) Living Arrangements: Parent Available Help at Discharge: Family;Available 24 hours/day Type of Home: House Home Access: Stairs to enter Entrance Stairs-Rails: Right Entrance Stairs-Number of Steps: 4 Home Layout: One level Home Equipment: Emergency planning/management officer - 2 wheels;Bedside commode Additional Comments: unknown, pt poor historian    Prior Function Level of Independence: Independent with assistive device(s)         Comments: Patient independent and working prior to 10/2020. Per PT evaluation, patient with recent decline in function requiring assist from mother with ADLs. Patient has also been using RW. Discharged from PT due to lack of progress with medical decline.     Hand Dominance   Dominant Hand: Right  Extremity/Trunk Assessment   Upper Extremity Assessment Upper Extremity Assessment: Defer to OT evaluation    Lower Extremity Assessment Lower Extremity Assessment: RLE deficits/detail;LLE deficits/detail RLE Deficits / Details: hip flexion/ext 1+, knee flexion/ext 1+; ankle DF 2+ RLE Sensation:  (difficult to assess due to cognition/language) LLE Deficits / Details: hip flexion/ext 1+, knee flexion/ext 1+; ankle DF 2+    Cervical / Trunk Assessment Cervical / Trunk Assessment: Kyphotic  Communication    Communication: Expressive difficulties (automatic speech at times; does not answer all questions; does not initiate conversation)  Cognition Arousal/Alertness: Awake/alert Behavior During Therapy: Flat affect Overall Cognitive Status: Difficult to assess Area of Impairment: Attention;Following commands;Problem solving                 Orientation Level:  (NT) Current Attention Level: Sustained   Following Commands: Follows one step commands consistently     Problem Solving: Slow processing;Decreased initiation;Difficulty sequencing;Requires verbal cues;Requires tactile cues        General Comments      Exercises     Assessment/Plan    PT Assessment Patient needs continued PT services  PT Problem List Decreased strength;Decreased balance;Decreased mobility;Decreased cognition;Decreased knowledge of use of DME       PT Treatment Interventions DME instruction;Gait training;Functional mobility training;Therapeutic activities;Therapeutic exercise;Balance training;Neuromuscular re-education;Cognitive remediation;Patient/family education    PT Goals (Current goals can be found in the Care Plan section)  Acute Rehab PT Goals Patient Stated Goal: None stated. PT Goal Formulation: Patient unable to participate in goal setting Time For Goal Achievement: 04/24/21 Potential to Achieve Goals: Poor    Frequency Min 2X/week   Barriers to discharge Inaccessible home environment;Decreased caregiver support      Co-evaluation               AM-PAC PT "6 Clicks" Mobility  Outcome Measure Help needed turning from your back to your side while in a flat bed without using bedrails?: Total Help needed moving from lying on your back to sitting on the side of a flat bed without using bedrails?: Total Help needed moving to and from a bed to a chair (including a wheelchair)?: Total Help needed standing up from a chair using your arms (e.g., wheelchair or bedside chair)?: Total Help  needed to walk in hospital room?: Total Help needed climbing 3-5 steps with a railing? : Total 6 Click Score: 6    End of Session   Activity Tolerance: Patient tolerated treatment well (awake, but not participating) Patient left: with call bell/phone within reach;in bed;with bed alarm set Nurse Communication: Other (comment) (following commands) PT Visit Diagnosis: Muscle weakness (generalized) (M62.81);Repeated falls (R29.6);Difficulty in walking, not elsewhere classified (R26.2);Unsteadiness on feet (R26.81)    Time: 8177-1165 PT Time Calculation (min) (ACUTE ONLY): 17 min   Charges:   PT Evaluation $PT Eval Low Complexity: 1 Low           Jerolyn Center, PT Pager [phone]   Zena Amos 04/10/2021, 3:51 PM

## 2021-04-10 NOTE — Progress Notes (Signed)
[facility] PROGRESS NOTE  Carolyn Norton WIO:[id number] DOB: Nov 06, 1965 DOA: 01/11/2021 PCP: Carolyn Lodge, FNP            March 27, 2021     Status: Remains inpatient appropriate because:Unsafe d/c plan-due to patient's underlying poor mental status short-term memory deficits she will require 24/7 care after discharge  Dispo:  Patient From:  Home  Planned Disposition: SNF w/ Hospice follow up at facility  Medically stable for discharge:  yes  Barriers to DC: Medicaid still pending; prior history of substance abuse and underlying documented bipolar disorder             Difficult to place: Yes  Level of care: Med-Surg  Code Status: DNR Family Communication: Father 8/03.  Updated on patient's improved mental status and suspicions that the low-dose Zyprexa that was initiated for anorexia has actually improved her mental status.  Explained that we are not certain what is the cause of her changes but clearly this medication appears to be improving her overall alertness. DVT prophylaxis: Lovenox COVID vaccination status: Unknown-post COVID infection 09/06/20   HPI: 55 year old female past medical history of diabetes mellitus type 2, bipolar disorder type I, previous COVID infection, polysubstance abuse that includes cocaine.  She presented from home where she lives with her parents.  Her presenting symptoms included generalized weakness, multiple falls and confusion.  Family confirmed that she had been having the symptoms for at least a month with last use of crack cocaine 1 month prior to presentation.  Ever since that time she had not been doing well.  She would also be having issues with hyperglycemia.  In the ER she was diagnosed with severe dehydration, hyperglycemia and metabolic encephalopathy.  Since admission patient has been evaluated by the psychiatric team who has deemed her alert and oriented and having capacity to make decisions.  Patient is agreeable to  placement at a rehab.  Currently she has severe physical deconditioning which was POA and PT is recommending SNF.  Because of her history of polysubstance abuse been difficult to obtain an SNF bed.  Initially her family is agreeable to taking her back in home when she is able to ambulate well enough to get back and forth for meals into the bathroom.  Unfortunately her cognitive and physical status has progressively declined during this admission.  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  Palliative medicine is continuing to follow along.  Patient's previous advanced directives stated she did not want a PEG tube if she were unable to eat.  Palliative medicine has had multiple meetings with the family on 7/15 decision was made to place cortrack tube for short-term feedings to see if patient demonstrates any improvement in symptoms.   Unfortunately no improvement. Cortrak dc'd. Famliy not ready for comfort measures. Concerned that [name] not eating due to dental pain. Multiple evaluations including imaging w/o abscess. Suspect has cavities which cannot be addressed in the IP setting. It is unlikely this is influencing her intake since her primary complaint for many weeks has been that she is not hungry.  Beginning on 8/2 patient had improved mentation.  Was more conversant and was able to consistently follow commands.  It is suspected that the Zyprexa that she was recently started on for her anorexia has actually improved her overall mentation.  Subjective: Alert and talkative.  Was unable to follow commands today.  When I told her goodbye today  she said "thank you and have a nice day".  Objective: Vitals:   04/09/21 1222 04/09/21 1935  BP: 133/83 121/75  Pulse: 98 80  Resp: 16 18  Temp: (!) 97.5 F (36.4 C)   SpO2: 96% 100%    Intake/Output Summary (Last 24 hours) at 04/10/2021 0750 Last data filed at 04/09/2021  0900 Gross per 24 hour  Intake 240 ml  Output --  Net 240 ml    Filed Weights   04/01/21 0736 04/03/21 0500 04/08/21 0413  Weight: 70.6 kg 68.5 kg 69.8 kg    Exam: Constitutional: Alert, no acute distress Respiratory: Lungs remain clear, stable on room air Cardiac: Normal heart sounds, no JVD or peripheral edema, regular pulse Abdomen: LBM 7/30, soft nontender nondistended.  Continues with variable intake typically not more than 50% of meals ingested.  Normoactive bowel sounds. Neurologic: Cranial nerves II through XII grossly intact.  Strength is about 2/5 in both upper and lower extremities.  She is consistently following simple commands today.  More involved in conversation.  Watching TV. Psychiatric: Alert and oriented times name and place.  Partially to year.  Pleasant.  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   Initially felt to be partially due to NPH physiology given some improved after initial large-volume LP procedure.  Unfortunately repeat large-volume LP without similar results.  MRI of her spine without any abnormalities to explain her progressive lower extremity weakness. Neurology suspects chronic progressive neurological decline secondary to longstanding drug abuse and it is also suspected that Long COVID neurological syndrome contributing.  Repeat EEG 7/25 with moderate diffuse encephalopathy and rpt MRI unchanged from prior in May As of 8/2 patient has had significantly improved neurological status with increased alertness, consistent following commands, more engagement in conversation and activities in room.  It is suspected that low-dose Zyprexa initiated for anorexia may be contributing to this  improvement.  Mechanism of action for Zyprexa in general is unknown but it is suspected that antagonizes dopamine, serotonin 5-HD 2 and other receptors.  Dysphagia/failure to thrive secondary to multifactorial neurological condition No improvement after Megace. No improvement after 10 days of cortrack tube feedings.  No PEG per patient per request.  Family not ready yet for comfort care. 7/27 PMT added low-dose Zyprexa as appetite stimulant but so far unsuccessful  Acute hypoxemic respiratory failure with underlying COPD Stable on room air  Diabetes mellitus 2 with hyperglycemia on insulin Preadmission hemoglobin A1c greater than 11 Continues to have difficulty with managing diabetes.  Variable oral intake contributing and she bounces between 5% and sometimes 60% of meals intake CBGs better controlled after yesterdays adjustments. Cont  a.m. Lantus to 5.  8/4 lunchtime CBG greater than 500 so we will increase p.m. Lantus from 7 units to 10 units Continue SSI.  History of bipolar 1 disorder/polysubstance abuse/cognitive impairment Continue to hold home escitalopram, Depakote and Neurontin secondary to persistent altered mentation and bipolar symptoms not present Given improvement in mental status will continue Zyprexa low-dose to treat her bipolar disorder.  Possibly she had an atypical presentation of catatonia type mental status change which seems to be responding to Zyprexa therefore we will go ahead and increase to 5 mg HS  Elevated blood pressure/New dx HTN/murmur No prior documented history of hypertension  Continue low-dose Norvasc 8/2 obtain echocardiogram secondary  to newly auscultated murmur; no valvular abnormalities but was c/w diastolic dysfunction Murmur likely 2/2 hyperdynamic state  Suspected constipation Last bowel movement 7/30 patient with poor oral intake; the patient could be contributing to poor intake Give 1 dose of milk of magnesia-if no results will obtain KUB and  treat accordingly  Profound physical deconditioning/critical illness myopathy Likely a combination of persistent altered mental status and inability to participate consistently with PT as well as myopathy of critical illness in association with long COVID neurological syndrome  Ventriculomegaly/? NPH NPH ruled out as above and is likely secondary to atrophy and generalized volume loss  Dyslipidemia Hold statin until oral intake improves  Physical deconditioning/bilateral heel cord shortening Continue bilateral PRAFO boots on every 4 hours and off every 4 hours Given progressive neurological decline unable to participate with therapies    Other problems: GERD Oral PPI on hold while n.p.o.  Hypokalemia/hypomagnesemia Resolved  E. Coli [name]/Klebsiella UTI Treated Use purewick only at bedtime    Data Reviewed: Basic Metabolic Panel: Recent Labs  Lab 04/07/21 0146  NA 132*  K 4.0  CL 99  CO2 24  GLUCOSE 459*  BUN 27*  CREATININE 1.15*  CALCIUM 9.5   Liver Function Tests: No results for input(s): AST, ALT, ALKPHOS, BILITOT, PROT, ALBUMIN in the last 168 hours.   No results for input(s): LIPASE, AMYLASE in the last 168 hours. No results for input(s): AMMONIA in the last 168 hours. CBC: No results for input(s): WBC, NEUTROABS, HGB, HCT, MCV, PLT in the last 168 hours.  Cardiac Enzymes: No results for input(s): CKTOTAL, CKMB, CKMBINDEX, TROPONINI in the last 168 hours. BNP (last 3 results) Recent Labs    03/24/21 0856 03/25/21 0203 03/26/21 0222  BNP 29.2 33.9 37.9    ProBNP (last 3 results) No results for input(s): PROBNP in the last 8760 hours.  CBG: Recent Labs  Lab 04/09/21 1602 04/09/21 1937 04/09/21 2343 04/10/21 0402 04/10/21 0720  GLUCAP 298* 200* 105* 120* 237*    No results found for this or any previous visit (from the past 240 hour(s)).     Studies: ECHOCARDIOGRAM COMPLETE  Result Date: 04/08/2021    ECHOCARDIOGRAM REPORT   Patient  Name:   Carolyn Norton Date of Exam: 04/08/2021 Medical Rec #:  [id number]            Height:       66.0 in Accession #:    [id number]           Weight:       153.9 lb Date of Birth:  [birth year]           BSA:          1.789 m Patient Age:    54 years             BP:           120/62 mmHg Patient Gender: F                    HR:           79 bpm. Exam Location:  Inpatient Procedure: Color Doppler and Cardiac Doppler Indications:    Murmur  History:        Patient has no prior history of Echocardiogram examinations.                 COPD; Risk Factors:Diabetes. H/O cocaine abuse.  Sonographer:    Ross LudwigArthur Guy RDCS (AE) Referring Phys: [id number] Caid Radin L  Loletta Harper  Sonographer Comments: No IV for Definity use. IMPRESSIONS  1. Left ventricular ejection fraction, by estimation, is 60 to 65%. The left ventricle has normal function. The left ventricle has no regional wall motion abnormalities. There is mild concentric left ventricular hypertrophy. Left ventricular diastolic parameters are consistent with Grade I diastolic dysfunction (impaired relaxation). Elevated left atrial pressure.  2. Right ventricular systolic function is normal. The right ventricular size is normal. Tricuspid regurgitation signal is inadequate for assessing PA pressure.  3. The mitral valve is normal in structure. No evidence of mitral valve regurgitation. No evidence of mitral stenosis.  4. The aortic valve is normal in structure. Aortic valve regurgitation is not visualized. No aortic stenosis is present.  5. The inferior vena cava is normal in size with greater than 50% respiratory variability, suggesting right atrial pressure of 3 mmHg. FINDINGS  Left Ventricle: Left ventricular ejection fraction, by estimation, is 60 to 65%. The left ventricle has normal function. The left ventricle has no regional wall motion abnormalities. The left ventricular internal cavity size was normal in size. There is  mild concentric left ventricular hypertrophy. Left  ventricular diastolic parameters are consistent with Grade I diastolic dysfunction (impaired relaxation). Elevated left atrial pressure. Right Ventricle: The right ventricular size is normal. No increase in right ventricular wall thickness. Right ventricular systolic function is normal. Tricuspid regurgitation signal is inadequate for assessing PA pressure. Left Atrium: Left atrial size was normal in size. Right Atrium: Right atrial size was normal in size. Pericardium: There is no evidence of pericardial effusion. Mitral Valve: The mitral valve is normal in structure. Mild mitral annular calcification. No evidence of mitral valve regurgitation. No evidence of mitral valve stenosis. MV peak gradient, 3.6 mmHg. The mean mitral valve gradient is 2.0 mmHg. Tricuspid Valve: The tricuspid valve is normal in structure. Tricuspid valve regurgitation is not demonstrated. No evidence of tricuspid stenosis. Aortic Valve: The aortic valve is normal in structure. Aortic valve regurgitation is not visualized. No aortic stenosis is present. Aortic valve mean gradient measures 5.0 mmHg. Aortic valve peak gradient measures 9.5 mmHg. Aortic valve area, by VTI measures 2.18 cm. Pulmonic Valve: The pulmonic valve was normal in structure. Pulmonic valve regurgitation is not visualized. No evidence of pulmonic stenosis. Aorta: The aortic root is normal in size and structure. Venous: The inferior vena cava is normal in size with greater than 50% respiratory variability, suggesting right atrial pressure of 3 mmHg. IAS/Shunts: No atrial level shunt detected by color flow Doppler.  LEFT VENTRICLE PLAX 2D LVIDd:         3.60 cm  Diastology LVIDs:         2.40 cm  LV e' medial:    5.12 cm/s LV PW:         1.30 cm  LV E/e' medial:  14.6 LV IVS:        1.40 cm  LV e' lateral:   4.56 cm/s LVOT diam:     1.80 cm  LV E/e' lateral: 16.4 LV SV:         68 LV SV Index:   38 LVOT Area:     2.54 cm  RIGHT VENTRICLE RV Basal diam:  2.90 cm RV S prime:      9.12 cm/s LEFT ATRIUM             Index       RIGHT ATRIUM           Index LA diam:  2.70 cm 1.51 cm/m  RA Area:     10.20 cm LA Vol (A2C):   32.5 ml 18.17 ml/m RA Volume:   21.40 ml  11.96 ml/m LA Vol (A4C):   23.8 ml 13.30 ml/m LA Biplane Vol: 29.8 ml 16.66 ml/m  AORTIC VALVE AV Area (Vmax):    2.05 cm AV Area (Vmean):   2.10 cm AV Area (VTI):     2.18 cm AV Vmax:           154.00 cm/s AV Vmean:          111.000 cm/s AV VTI:            0.314 m AV Peak Grad:      9.5 mmHg AV Mean Grad:      5.0 mmHg LVOT Vmax:         124.00 cm/s LVOT Vmean:        91.400 cm/s LVOT VTI:          0.269 m LVOT/AV VTI ratio: 0.86  AORTA Ao Root diam: 2.80 cm Ao Asc diam:  3.00 cm MITRAL VALVE               TRICUSPID VALVE MV Area (PHT): 2.95 cm    TR Peak grad:   6.1 mmHg MV Area VTI:   2.56 cm    TR Vmax:        123.00 cm/s MV Peak grad:  3.6 mmHg MV Mean grad:  2.0 mmHg    SHUNTS MV Vmax:       0.95 m/s    Systemic VTI:  0.27 m MV Vmean:      65.7 cm/s   Systemic Diam: 1.80 cm MV Decel Time: 257 msec MV E velocity: 74.80 cm/s MV A velocity: 86.40 cm/s MV E/A ratio:  0.87 Mihai Croitoru MD Electronically signed by Thurmon Fair MD Signature Date/Time: 04/08/2021/5:25:58 PM    Final     Scheduled Meds:  (feeding supplement) PROSource Plus  30 mL Oral TID BM   amLODipine  5 mg Oral Daily   enoxaparin (LOVENOX) injection  40 mg Subcutaneous Q24H   feeding supplement (GLUCERNA SHAKE)  237 mL Oral TID BM   insulin aspart  0-9 Units Subcutaneous Q4H   insulin glargine-yfgn  5 Units Subcutaneous Daily   And   insulin glargine-yfgn  7 Units Subcutaneous QHS   metoprolol tartrate  50 mg Oral BID   multivitamin  15 mL Oral Daily   OLANZapine zydis  2.5 mg Oral QHS   Continuous Infusions:      Principal Problem:   Bipolar 1 disorder, depressed, full remission (HCC) Active Problems:   Bipolar 1 disorder (HCC)   Hypertension   Generalized weakness   Controlled type 2 diabetes mellitus with  hyperglycemia, with long-term current use of insulin (HCC)   Hyperlipidemia   NPH (normal pressure hydrocephalus) (HCC)   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   Protein-calorie malnutrition, severe   Consultants: Psychiatry Neurology Neurosurgery  Procedures: None  Antibiotics: Anti-infectives (From admission, onward)    Start     Dose/Rate Route Frequency Ordered Stop   03/06/21 0900  ceFAZolin (ANCEF) IVPB 1 g/50 mL premix        1 g 100 mL/hr over 30 Minutes Intravenous Every 8 hours 03/06/21 0801 03/10/21 0559   03/04/21 1430  cefTRIAXone (ROCEPHIN) 1 g in sodium chloride 0.9 % 100 mL IVPB  Status:  Discontinued        1 g 200 mL/hr  over 30 Minutes Intravenous Every 24 hours 03/04/21 1335 03/06/21 0800   02/14/21 0828  vancomycin (VANCOREADY) IVPB 1000 mg/200 mL  Status:  Discontinued        1,000 mg 200 mL/hr over 60 Minutes Intravenous 60 min pre-op 02/14/21 0828 03/02/21 1139   02/13/21 2115  cefTRIAXone (ROCEPHIN) 1 g in sodium chloride 0.9 % 100 mL IVPB  Status:  Discontinued        1 g 200 mL/hr over 30 Minutes Intravenous Every 24 hours 02/13/21 2016 02/16/21 0844        Time spent: 15 minutes    [name] ANP  [facility] 7 am - 330 pm/M-F for direct patient care and secure chat Please refer to Amion for contact info 88  days

## 2021-04-10 NOTE — TOC Progression Note (Addendum)
Transition of Care [facility]) - Progression Note    Patient Details  Name: Carolyn Norton MRN: [id number] Date of Birth: [birth year]  Transition of Care [facility]) CM/SW Contact  Janae Bridgeman, RN Phone Number: 04/10/2021, 10:24 AM  Clinical Narrative:    Case management called and spoke with Blanchie Dessert, CM with [facility] on the phone and asked that the facility review the patient for possible SNF placement.  Clinicals were resent in the hub to the facility to review at this time.  Clinicals also uploaded in hub for Sarina Ill, CM to review for [facility] facilities.  CM and MSW to continue to follow the patient for SNF placement / DTP bed at an accepting facility.   Expected Discharge Plan: Hospice Medical Facility Barriers to Discharge: Inadequate or no insurance, Family Issues  Expected Discharge Plan and Services Expected Discharge Plan: Hospice Medical Facility In-house Referral: Clinical Social Work Discharge Planning Services: CM Consult Post Acute Care Choice: Skilled Nursing Facility Living arrangements for the past 2 months: Single Family Home Expected Discharge Date: 01/16/21               DME Arranged: [name] rolling DME Agency: [employer] Date DME Agency Contacted: 01/20/21 Time DME Agency Contacted: 1435 Representative spoke with at DME Agency: [name]             Social Determinants of Health (SDOH) Interventions    Readmission Risk Interventions Readmission Risk Prevention Plan 01/14/2021  Transportation Screening Complete  PCP or Specialist Appt within 3-5 Days (No Data)  Social Work Consult for Recovery Care Planning/Counseling Complete  Palliative Care Screening Not Applicable  Medication Review Oceanographer) Referral to Pharmacy  Some recent data might be hidden

## 2021-04-10 NOTE — Progress Notes (Signed)
CSW spoke with Velna Hatchet of [facility] after her visit with the patient - Velna Hatchet reports the patient stated she would not participate in therapy at the facility. Velna Hatchet reports she will speak with her administrator about potentially offering a bed.  Edwin Dada, MSW, LCSW Transitions of Care  Clinical Social Worker II [phone]

## 2021-04-11 LAB — GLUCOSE, CAPILLARY
Glucose-Capillary: 505 mg/dL (ref 70–99)
Glucose-Capillary: 120 mg/dL — ABNORMAL HIGH (ref 70–99)
Glucose-Capillary: 285 mg/dL — ABNORMAL HIGH (ref 70–99)
Glucose-Capillary: 274 mg/dL — ABNORMAL HIGH (ref 70–99)
Glucose-Capillary: 307 mg/dL — ABNORMAL HIGH (ref 70–99)
Glucose-Capillary: 265 mg/dL — ABNORMAL HIGH (ref 70–99)

## 2021-04-11 NOTE — Progress Notes (Addendum)
Inpatient Diabetes Program Recommendations  AACE/ADA: New Consensus Statement on Inpatient Glycemic Control (2015)  Target Ranges:  Prepandial:   less than 140 mg/dL      Peak postprandial:   less than 180 mg/dL (1-2 hours)      Critically ill patients:  140 - 180 mg/dL   Results for JAELEE, LAUGHTER (MRN [id number]) as of 04/11/2021 13:18  Ref. Range 04/10/2021 23:48 04/11/2021 03:35 04/11/2021 08:02 04/11/2021 11:52  Glucose-Capillary Latest Ref Range: 70 - 99 mg/dL 94 885 (H) 027 (H)  5 units NOVOLOG  274 (H)  5 units NOVOLOG     Home DM Meds: Lantus 50 units QHS  Current Orders: Semglee 5 units AM/ 10 units PM Novolog 0-9 units Q4H     Note PM dose of Lantus increased last PM  Getting Glucerna PO supps TID between meals    MD- Note 8AM CBGs have been elevated the last 2 days and afternoon CBGs have also been elevated.  PO intake quite variable but does seem to be drinking the PO Glucerna supps on a regular basis.  Please consider:  1. Increase the AM dose of Semglee to 10 units Daily  2. Consider ordering Novolog Coverage to be given with the PO supplement: Novolog 2 units with each Glucerna     --Will follow patient during hospitalization--  Ambrose Finland RN, MSN, CDE Diabetes Coordinator Inpatient Glycemic Control Team Team Pager: [phone] (8a-5p)

## 2021-04-11 NOTE — Progress Notes (Signed)
CSW was notified by Velna Hatchet at [facility] that the facility will not offer the patient a bed.  CSW was notified by Sullivan Lone at [facility] that the facility will not offer the patient a bed.  Edwin Dada, MSW, LCSW Transitions of Care  Clinical Social Worker II [phone]

## 2021-04-11 NOTE — Progress Notes (Signed)
[facility] PROGRESS NOTE  Carolyn Norton HUD:[id number] DOB: 1966/03/13 DOA: 01/11/2021 PCP: Carolyn Lodge, FNP            March 27, 2021     Status: Remains inpatient appropriate because:Unsafe d/c plan-due to patient's underlying poor mental status short-term memory deficits she will require 24/7 care after discharge  Dispo:  Patient From:  Home  Planned Disposition: SNF w/ Hospice follow up at facility  Medically stable for discharge:  yes  Barriers to DC: Medicaid still pending; prior history of substance abuse and underlying documented bipolar disorder             Difficult to place: Yes  Level of care: Med-Surg  Code Status: DNR Family Communication: Father 8/03.  Updated on patient's improved mental status and suspicions that the low-dose Zyprexa that was initiated for anorexia has actually improved her mental status.  Explained that we are not certain what is the cause of her changes but clearly this medication appears to be improving her overall alertness. DVT prophylaxis: Lovenox COVID vaccination status: Unknown-post COVID infection 09/06/20   HPI: 55 year old female past medical history of diabetes mellitus type 2, bipolar disorder type I, previous COVID infection, polysubstance abuse that includes cocaine.  She presented from home where she lives with her parents.  Her presenting symptoms included generalized weakness, multiple falls and confusion.  Family confirmed that she had been having the symptoms for at least a month with last use of crack cocaine 1 month prior to presentation.  Ever since that time she had not been doing well.  She would also be having issues with hyperglycemia.  In the ER she was diagnosed with severe dehydration, hyperglycemia and metabolic encephalopathy.  Since admission patient has been evaluated by the psychiatric team who has deemed her alert and oriented and having capacity to make decisions.  Patient is agreeable to  placement at a rehab.  Currently she has severe physical deconditioning which was POA and PT is recommending SNF.  Because of her history of polysubstance abuse been difficult to obtain an SNF bed.  Initially her family is agreeable to taking her back in home when she is able to ambulate well enough to get back and forth for meals into the bathroom.  Unfortunately her cognitive and physical status has progressively declined during this admission.  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  Palliative medicine is continuing to follow along.  Patient's previous advanced directives stated she did not want a PEG tube if she were unable to eat.  Palliative medicine has had multiple meetings with the family on 7/15 decision was made to place cortrack tube for short-term feedings to see if patient demonstrates any improvement in symptoms.   Unfortunately no improvement. Cortrak dc'd. Famliy not ready for comfort measures. Concerned that Carolyn Norton not eating due to dental pain. Multiple evaluations including imaging w/o abscess. Suspect has cavities which cannot be addressed in the IP setting. It is unlikely this is influencing her intake since her primary complaint for many weeks has been that she is not hungry.  Beginning on 8/2 patient had improved mentation.  Was more conversant and was able to consistently follow commands.  It is suspected that the Zyprexa that she was recently started on for her anorexia has actually improved her overall mentation.  Subjective: More drowsy this morning after Zyprexa dose increased yesterday --she was able to wake up and confirm  she felt more sleepy than usual.  He was able to briskly follow commands by squeezing hands when asked  Objective: Vitals:   04/10/21 2006 04/11/21 0349  BP: 124/70 132/73  Pulse: 92 89  Resp: 18 16  Temp:  98 F (36.7 C)  SpO2: 100% 100%    Intake/Output  Summary (Last 24 hours) at 04/11/2021 0749 Last data filed at 04/11/2021 0300 Gross per 24 hour  Intake --  Output 300 ml  Net -300 ml    Filed Weights   04/01/21 0736 04/03/21 0500 04/08/21 0413  Weight: 70.6 kg 68.5 kg 69.8 kg    Exam: Constitutional: Drowsy but in no acute distress and wakes up easily Respiratory: Lungs remain clear to auscultation and she is stable on room air.  No wheezing Cardiac: Heart sounds are normal, no JVD or peripheral edema, regular pulse and normotensive. Abdomen: LBM 7/30, soft and nontender with normoactive bowel sounds.  With poor to fair oral intake. Neurologic: Cranial nerves are grossly intact.  More drowsy today but patient has awareness of that.  No focal neurological deficits.  Able to squeeze hands briskly and equally when asked although strength still is about 2-3/5 Psychiatric: Drowsy but awakens quickly.  Oriented to name and place only.  Responding appropriately with verbal responses to direct questioning.  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   Initially felt to be partially due to NPH physiology given some improved after initial large-volume LP procedure.  Unfortunately repeat large-volume LP without similar results.  MRI of her spine without any abnormalities to explain her progressive lower extremity weakness. Neurology suspects chronic progressive neurological decline secondary to longstanding drug abuse and it is also suspected that Long COVID neurological syndrome contributing.  Repeat EEG 7/25 with moderate diffuse encephalopathy and rpt MRI unchanged from prior in May Improvement in mentation about 1 week after the addition of Zyprexa.  Somewhat more drowsy in  a.m. after increased dose of Zyprexa the previous evening.  Dose may need to be adjusted back down.  Dysphagia/failure to thrive secondary to multifactorial neurological condition No improvement after Megace. No improvement after 10 days of cortrack tube feedings.  No PEG per patient per request.  Family not ready yet for comfort care. 7/27 PMT added low-dose Zyprexa as appetite stimulant and noted w/ improvement in mentation  Acute hypoxemic respiratory failure with underlying COPD Stable on room air  Diabetes mellitus 2 with hyperglycemia on insulin Preadmission hemoglobin A1c greater than 11 Continues to have difficulty with managing diabetes.  Variable oral intake contributing and she bounces between 5% and sometimes 60% of meals intake Continue current dose of Lantus BID Continue SSI.  History of bipolar 1 disorder/polysubstance abuse/cognitive impairment Continue to hold home escitalopram, Depakote and Neurontin secondary to persistent altered mentation and bipolar symptoms not present Given improvement in mental status will continue Zyprexa low-dose to treat her bipolar disorder.  Possibly she had an atypical presentation of catatonia type mental status change which seems to be responding to Zyprexa increased to 5 mg HS with new dose given on 8/4.  Unfortunately seems slightly more sedated on 8/5 suddenly need to reconsider and decrease back down to lower dose  Elevated blood pressure/New dx HTN/murmur No prior documented history of hypertension  Continue low-dose Norvasc 8/2 obtain echocardiogram secondary to newly auscultated murmur;  no valvular abnormalities but was c/w diastolic dysfunction Murmur likely 2/2 hyperdynamic state  Suspected constipation Last bowel movement 7/30 patient with poor oral intake; the patient could be contributing to poor intake Give 1 dose of milk of magnesia-if no results will obtain KUB and treat accordingly  Profound physical  deconditioning/critical illness myopathy Likely a combination of persistent altered mental status and inability to participate consistently with PT as well as myopathy of critical illness in association with long COVID neurological syndrome  Ventriculomegaly/? NPH NPH ruled out as above and is likely secondary to atrophy and generalized volume loss  Dyslipidemia Hold statin until oral intake improves  Physical deconditioning/bilateral heel cord shortening Continue bilateral PRAFO boots on every 4 hours and off every 4 hours Given progressive neurological decline unable to participate with therapies    Other problems: GERD Oral PPI on hold while n.p.o.  Hypokalemia/hypomagnesemia Resolved  E. Coli [name] UTI Treated Use purewick only at bedtime    Data Reviewed: Basic Metabolic Panel: Recent Labs  Lab 04/07/21 0146  NA 132*  K 4.0  CL 99  CO2 24  GLUCOSE 459*  BUN 27*  CREATININE 1.15*  CALCIUM 9.5   Liver Function Tests: No results for input(s): AST, ALT, ALKPHOS, BILITOT, PROT, ALBUMIN in the last 168 hours.   No results for input(s): LIPASE, AMYLASE in the last 168 hours. No results for input(s): AMMONIA in the last 168 hours. CBC: No results for input(s): WBC, NEUTROABS, HGB, HCT, MCV, PLT in the last 168 hours.  Cardiac Enzymes: No results for input(s): CKTOTAL, CKMB, CKMBINDEX, TROPONINI in the last 168 hours. BNP (last 3 results) Recent Labs    03/24/21 0856 03/25/21 0203 03/26/21 0222  BNP 29.2 33.9 37.9    ProBNP (last 3 results) No results for input(s): PROBNP in the last 8760 hours.  CBG: Recent Labs  Lab 04/10/21 1119 04/10/21 1636 04/10/21 2008 04/10/21 2348 04/11/21 0335  GLUCAP 459* 303* 335* 94 120*    No results found for this or any previous visit (from the past 240 hour(s)).     Studies: No results found.  Scheduled Meds:  (feeding supplement) PROSource Plus  30 mL Oral TID BM   amLODipine  5 mg Oral Daily    enoxaparin (LOVENOX) injection  40 mg Subcutaneous Q24H   feeding supplement (GLUCERNA SHAKE)  237 mL Oral TID BM   insulin aspart  0-9 Units Subcutaneous Q4H   insulin glargine-yfgn  10 Units Subcutaneous QHS   And   insulin glargine-yfgn  5 Units Subcutaneous Daily   metoprolol tartrate  50 mg Oral BID   multivitamin  15 mL Oral Daily   OLANZapine zydis  5 mg Oral QHS   Continuous Infusions:      Principal Problem:   Bipolar 1 disorder, depressed, full remission (HCC) Active Problems:   Bipolar 1 disorder (HCC)   Hypertension   Generalized weakness   Controlled type 2 diabetes mellitus with hyperglycemia, with long-term current use of insulin (HCC)   Hyperlipidemia   NPH (normal pressure hydrocephalus) (HCC)   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   Protein-calorie malnutrition, severe   Consultants: Psychiatry Neurology Neurosurgery  Procedures: None  Antibiotics: Anti-infectives (From admission, onward)    Start     Dose/Rate Route Frequency Ordered Stop   03/06/21 0900  ceFAZolin (ANCEF) IVPB 1 g/50 mL premix        1 g 100 mL/hr over 30 Minutes Intravenous Every 8 hours 03/06/21 0801 03/10/21 0559  03/04/21 1430  cefTRIAXone (ROCEPHIN) 1 g in sodium chloride 0.9 % 100 mL IVPB  Status:  Discontinued        1 g 200 mL/hr over 30 Minutes Intravenous Every 24 hours 03/04/21 1335 03/06/21 0800   02/14/21 0828  vancomycin (VANCOREADY) IVPB 1000 mg/200 mL  Status:  Discontinued        1,000 mg 200 mL/hr over 60 Minutes Intravenous 60 min pre-op 02/14/21 0828 03/02/21 1139   02/13/21 2115  cefTRIAXone (ROCEPHIN) 1 g in sodium chloride 0.9 % 100 mL IVPB  Status:  Discontinued        1 g 200 mL/hr over 30 Minutes Intravenous Every 24 hours 02/13/21 2016 02/16/21 0844        Time spent: 15 minutes    [name] ANP  [facility] 7 am - 330 pm/M-F for direct patient care and secure chat Please refer to Amion for contact info 89  days

## 2021-04-12 LAB — COMPREHENSIVE METABOLIC PANEL
ALT: 10 U/L (ref 0–44)
AST: 10 U/L — ABNORMAL LOW (ref 15–41)
Albumin: 3 g/dL — ABNORMAL LOW (ref 3.5–5.0)
Alkaline Phosphatase: 65 U/L (ref 38–126)
Anion gap: 8 (ref 5–15)
BUN: 24 mg/dL — ABNORMAL HIGH (ref 6–20)
CO2: 28 mmol/L (ref 22–32)
Calcium: 9.9 mg/dL (ref 8.9–10.3)
Chloride: 103 mmol/L (ref 98–111)
Creatinine, Ser: 0.91 mg/dL (ref 0.44–1.00)
GFR, Estimated: >60 mL/min (ref >60)
Glucose, Bld: 156 mg/dL — ABNORMAL HIGH (ref 70–99)
Potassium: 3.8 mmol/L (ref 3.5–5.1)
Sodium: 139 mmol/L (ref 135–145)
Total Bilirubin: 0.5 mg/dL (ref 0.3–1.2)
Total Protein: 6.4 g/dL — ABNORMAL LOW (ref 6.5–8.1)

## 2021-04-12 LAB — CBC WITH DIFFERENTIAL/PLATELET
Abs Immature Granulocytes: 0.03 10*3/uL (ref 0.00–0.07)
Basophils Absolute: 0.1 10*3/uL (ref 0.0–0.1)
Basophils Relative: 1 %
Eosinophils Absolute: 0.4 10*3/uL (ref 0.0–0.5)
Eosinophils Relative: 4 %
HCT: 38.6 % (ref 36.0–46.0)
Hemoglobin: 12.8 g/dL (ref 12.0–15.0)
Immature Granulocytes: 0 %
Lymphocytes Relative: 41 %
Lymphs Abs: 4.2 10*3/uL — ABNORMAL HIGH (ref 0.7–4.0)
MCH: 29.8 pg (ref 26.0–34.0)
MCHC: 33.2 g/dL (ref 30.0–36.0)
MCV: 90 fL (ref 80.0–100.0)
Monocytes Absolute: 1 10*3/uL (ref 0.1–1.0)
Monocytes Relative: 10 %
Neutro Abs: 4.8 10*3/uL (ref 1.7–7.7)
Neutrophils Relative %: 44 %
Platelets: 434 10*3/uL — ABNORMAL HIGH (ref 150–400)
RBC: 4.29 MIL/uL (ref 3.87–5.11)
RDW: 13.1 % (ref 11.5–15.5)
WBC: 10.5 10*3/uL (ref 4.0–10.5)
nRBC: 0 % (ref 0.0–0.2)

## 2021-04-12 LAB — GLUCOSE, CAPILLARY
Glucose-Capillary: 121 mg/dL — ABNORMAL HIGH (ref 70–99)
Glucose-Capillary: 147 mg/dL — ABNORMAL HIGH (ref 70–99)
Glucose-Capillary: 191 mg/dL — ABNORMAL HIGH (ref 70–99)
Glucose-Capillary: 203 mg/dL — ABNORMAL HIGH (ref 70–99)
Glucose-Capillary: 322 mg/dL — ABNORMAL HIGH (ref 70–99)
Glucose-Capillary: 322 mg/dL — ABNORMAL HIGH (ref 70–99)

## 2021-04-12 MED ORDER — INSULIN GLARGINE-YFGN 100 UNIT/ML ~~LOC~~ SOLN
10.0000 [IU] | Freq: Every day | SUBCUTANEOUS | Status: DC
  Administered 2021-04-12 – 2021-04-13 (×2): 10 [IU] via SUBCUTANEOUS
  Filled 2021-04-12 (×3): qty 0.1

## 2021-04-12 MED ORDER — INSULIN ASPART 100 UNIT/ML IJ SOLN
2.0000 [IU] | Freq: Three times a day (TID) | INTRAMUSCULAR | Status: DC
  Administered 2021-04-13 – 2021-04-21 (×24): 2 [IU] via SUBCUTANEOUS

## 2021-04-12 MED ORDER — INSULIN GLARGINE-YFGN 100 UNIT/ML ~~LOC~~ SOLN
10.0000 [IU] | Freq: Every day | SUBCUTANEOUS | Status: DC
  Administered 2021-04-13: 10 [IU] via SUBCUTANEOUS
  Filled 2021-04-12 (×2): qty 0.1

## 2021-04-12 NOTE — Progress Notes (Signed)
Patient ID: Carolyn Norton, female   DOB: 09/08/1965, 55 y.o.   MRN: [id number]  PROGRESS NOTE    Carolyn Norton  LYY:[id number] DOB: [birth year] DOA: 01/11/2021 PCP: [name], FNP    Brief Narrative:  55 year old female with past medical history of diabetes mellitus type 2, bipolar disorder, cocaine abuse, cognitive impairment who lives with her parents and is taken care of by them. She was unable to give a history in the ED.   She presented from home with hyperglycemia (CBG 700s), steady cognitive and functional decline at home  with inability to ambulate with multiple falls over the past 3 weeks. She had been confused for 4 months (since she had COVID). She had stopped taking her meds 3 days prior to coming in which appeared to be the reason her parents brought her. She was unable to ambulate in the ED and had hyperglycemia (CBG 729). She was admitted to [facility].   Her hospital stay has been complicated by progressive cognitive and functional decline.  She has had psychiatry, palliative care, neurology and neurosurgery consults but no cause for her rapid cognitive decline has been found.  At this point, PT has signed off not recommending her for SNF, but for some kind of long term care.   Cortrak feeding was tried for a period of time with no improvement and subsequently discontinued.  Family not ready for comfort measures.  Oral intake is still poor  Mental status has improved after the addition of Zyprexa.  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  She was subsequently seen by neurology along with neurosurgery for suspicion of NPH, multiple large-volume LPs were done and eventually on 03/06/2021 clinically and after an LP neurology finally ruled out NPH.  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  Continue supportive care.  Look for appropriate placement. -Repeat EEG on 03/31/2021 showed moderate diffuse encephalopathy and repeat MRI subsequently was unchanged from prior MRI in May 2022. Mental status has improved on low-dose Zyprexa we will continue to follow   Failure to thrive/dysphagia/extremely poor oral intake/severe protein calorie malnutrition Profound physical deconditioning/critical illness myopathy -No improvement with Megace - Cortrak feeding was tried for 10 days without improvement and subsequently discontinued. -No PEG per patient request.  Family not ready for comfort care yet. -Palliative care team following and has added a low-dose Zyprexa    Acute hypoxic respiratory failure - Due to aspiration pneumonitis.  Resolved.    COPD -currently stable   E. coli and Klebsiella UTI- ~ 6/27  - Has been treated   Bipolar disorder - she was on - Lexapro, Depakote and gabapentin.  These have been discontinued because of persistent altered mental status   History of polysubstance abuse  - Including cocaine and alcohol, On 2/15 she  was cocaine and benzodiazepine positive.  She has been extensively counseled to quit all.   DM2 uncontrolled with hyperglycemia and hypoglycemia- Hemoglobin A1c is 11 on 01/12/21 suggesting poor outpatient control due to hyperglycemia.   Continue Semglee 10 units twice daily Sign scale insulin NovoLog 2 units with concern of her diabetes educator recommendation  Hypertension -Continue Norvasc.   DVT prophylaxis: Lovenox SQ Code Status: DNR  Family Communication: Patient at bedside Disposition Plan: Long-term care placement.  Patient remains inpatient due to unsafe discharge plan   Consultants:  Neurosurgery Palliative care Neurology Psychiatry  Procedures: To the echo Or EEG Were core track placement  Antimicrobials: Anti-infectives (From admission, onward)    Start     Dose/Rate Route Frequency Ordered Stop   03/06/21 0900  ceFAZolin (ANCEF) IVPB 1 g/50 mL premix        1 g 100 mL/hr over 30 Minutes Intravenous Every 8 hours 03/06/21 0801 03/10/21 0559   03/04/21 1430  cefTRIAXone (ROCEPHIN) 1 g in sodium chloride 0.9 % 100 mL IVPB  Status:  Discontinued        1 g 200 mL/hr over 30 Minutes Intravenous Every 24 hours 03/04/21 1335 03/06/21 0800   02/14/21 0828  vancomycin (VANCOREADY) IVPB 1000 mg/200 mL  Status:  Discontinued        1,000 mg 200 mL/hr over 60 Minutes Intravenous 60 min pre-op 02/14/21 0828 03/02/21 1139   02/13/21 2115  cefTRIAXone (ROCEPHIN) 1 g in sodium chloride 0.9 % 100 mL IVPB  Status:  Discontinued        1 g 200 mL/hr over 30 Minutes Intravenous Every 24 hours 02/13/21 2016 02/16/21 0844        Subjective: Patient reports feeling well today she is awake and alert and very conversant  Objective: Vitals:   04/11/21 1002 04/11/21 1632 04/11/21 2158 04/12/21 1341  BP: 136/62 120/74 116/75 124/78  Pulse: 82 78 84 75  Resp:  20 20 16   Temp:  97.7 F (36.5 C) 97.6 F (36.4 C)   TempSrc:  Oral Oral   SpO2:  97% 97% 98%  Weight:      Height:       No intake or output data in the 24 hours ending 04/12/21 1717 Filed Weights   04/01/21 0736 04/03/21 0500 04/08/21 0413  Weight: 70.6 kg 68.5 kg 69.8 kg    Examination:  General exam: Appears calm and comfortable   Respiratory system: Clear to auscultation. Respiratory effort normal. Cardiovascular system: S1 & S2 heard, RRR.  Gastrointestinal system: Abdomen is nondistended, soft and nontender.  Central nervous system: Alert and oriented. No focal neurological deficits. Extremities: Symmetric  Skin: No rashes  Data Reviewed: I have personally reviewed following labs and imaging studies  CBC: Recent Labs  Lab 04/12/21 0217  WBC 10.5  NEUTROABS 4.8  HGB 12.8  HCT 38.6  MCV 90.0  PLT 434*   Basic Metabolic Panel: Recent Labs  Lab 04/07/21 0146 04/12/21 0217  NA 132* 139  K 4.0 3.8  CL 99 103  CO2 24 28  GLUCOSE 459* 156*  BUN 27* 24*  CREATININE 1.15* 0.91  CALCIUM 9.5 9.9   GFR: Estimated Creatinine Clearance: 66.9 mL/min (by C-G formula based on SCr of 0.91 mg/dL). Liver Function Tests: Recent Labs  Lab 04/12/21 0217  AST 10*  ALT 10  ALKPHOS 65  BILITOT 0.5  PROT 6.4*  ALBUMIN 3.0*    CBG: Recent Labs  Lab 04/12/21 0001 04/12/21 0402 04/12/21 0756  04/12/21 1135 04/12/21 1628  GLUCAP 191* 121* 147* 322* 203*    Scheduled Meds:  (feeding supplement) PROSource Plus  30 mL Oral TID BM   amLODipine  5 mg Oral Daily   enoxaparin (LOVENOX) injection  40 mg Subcutaneous Q24H   feeding supplement (GLUCERNA SHAKE)  237 mL Oral TID BM   insulin aspart  0-9 Units Subcutaneous Q4H   [START ON 04/13/2021] insulin aspart  2 Units Subcutaneous TID WC   [START ON 04/13/2021] insulin glargine-yfgn  10 Units Subcutaneous Daily   And   insulin glargine-yfgn  10 Units Subcutaneous QHS   metoprolol tartrate  50 mg Oral BID   multivitamin  15 mL Oral Daily   OLANZapine zydis  5 mg Oral QHS   Continuous Infusions:   LOS: 90 days    Reva Bores, MD 04/12/2021 5:17 PM [phone] [facility] If 7PM-7AM, please contact night-coverage 04/12/2021, 5:17 PM

## 2021-04-13 LAB — GLUCOSE, CAPILLARY
Glucose-Capillary: 104 mg/dL — ABNORMAL HIGH (ref 70–99)
Glucose-Capillary: 159 mg/dL — ABNORMAL HIGH (ref 70–99)
Glucose-Capillary: 183 mg/dL — ABNORMAL HIGH (ref 70–99)
Glucose-Capillary: 312 mg/dL — ABNORMAL HIGH (ref 70–99)
Glucose-Capillary: 479 mg/dL — ABNORMAL HIGH (ref 70–99)
Glucose-Capillary: 483 mg/dL — ABNORMAL HIGH (ref 70–99)
Glucose-Capillary: 520 mg/dL (ref 70–99)

## 2021-04-13 NOTE — Progress Notes (Signed)
PROGRESS NOTE    Carolyn Norton  NOM:[id number] DOB: 07-19-1966 DOA: 01/11/2021 PCP: [name], FNP   Brief Narrative: Carolyn Norton is a 55 y.o. female with past medical history of diabetes mellitus type 2, bipolar disorder, cocaine abuse, cognitive impairment who lives with her parents and is taken care of by them. She was unable to give a history in the ED.   She presented from home with hyperglycemia (CBG 700s), steady cognitive and functional decline at home  with inability to ambulate with multiple falls over the past 3 weeks. She had been confused for 4 months (since she had COVID). She had stopped taking her meds 3 days prior to coming in which appeared to be the reason her parents brought her. She was unable to ambulate in the ED and had hyperglycemia (CBG 729). She was admitted to [facility].   Her hospital stay has been complicated by progressive cognitive and functional decline.  She has had psychiatry, palliative care, neurology and neurosurgery consults but no cause for her rapid cognitive decline has been found.  At this point, PT has signed off not recommending her for SNF, but for some kind of long term care.   Cortrak feeding was tried for a period of time with no improvement and subsequently discontinued.  Family not ready for comfort measures.  Oral intake is still poor   Mental status has improved after the addition of Zyprexa.   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   Protein-calorie malnutrition, severe   Metabolic encephalopathy Currently stable.  Per notes, Zyprexa appears to have improved her  mentation. -Continue Zyprexa  Failure to thrive/dysphagia/extremely poor oral intake/severe protein calorie malnutrition Profound physical deconditioning/critical illness myopathy Acute hypoxic respiratory failure COPD E. coli and Klebsiella UTI  Bipolar disorder  History of polysubstance abuse  DM2 uncontrolled with hyperglycemia and hypoglycemia  Hypertension  Please refer to progress note dated 8/5 for full assessment and plan   DVT prophylaxis: Lovenox Code Status:   Code Status: DNR Family Communication: None at bedside Disposition Plan: Discharge to facility when bed is available   Consultants:  Neurosurgery Palliative care Neurology Psychiatry  Procedures:  TRANSTHORACIC ECHOCARDIOGRAM (04/08/2021) IMPRESSIONS     1. Left ventricular ejection fraction, by estimation, is 60 to 65%. The  left ventricle has normal function. The left ventricle has no regional  wall motion abnormalities. There is mild concentric left ventricular  hypertrophy. Left ventricular diastolic  parameters are consistent with Grade I diastolic dysfunction (impaired  relaxation). Elevated left atrial pressure.   2. Right ventricular systolic function is normal. The right ventricular  size is normal. Tricuspid regurgitation signal is inadequate for assessing  PA pressure.   3. The mitral valve is normal in structure. No evidence of mitral valve  regurgitation. No evidence of mitral stenosis.   4. The aortic valve is normal in structure. Aortic valve regurgitation is  not visualized. No aortic stenosis is present.   5. The inferior vena cava is normal in size with greater than 50%  respiratory variability, suggesting right atrial pressure of 3 mmHg.  EEG (03/31/2021) IMPRESSION: This study is suggestive of moderate diffuse encephalopathy,  nonspecific etiology. No seizures or definite epileptiform discharges were seen throughout the recording.  EEG (04/01/2021) IMPRESSION: This study is  suggestive of moderate diffuse encephalopathy, nonspecific etiology but could be secondary to toxic-metabolic causes.  No seizures or definite epileptiform discharges were seen throughout the recording.  Antimicrobials: Cefazolin IV    Subjective: No concerns overnight.  Objective: Vitals:   04/13/21 0426 04/13/21 0654 04/13/21 1142 04/13/21 1357  BP:  132/84 126/75 136/80  Pulse:  85 89 81  Resp:  18 16 16   Temp:  98.8 F (37.1 C) 98.9 F (37.2 C) 98.4 F (36.9 C)  TempSrc:  Oral Oral Oral  SpO2:  98% 98% 98%  Weight: 69.2 kg     Height:        Intake/Output Summary (Last 24 hours) at 04/13/2021 1411 Last data filed at 04/13/2021 0900 Gross per 24 hour  Intake 360 ml  Output 450 ml  Net -90 ml   Filed Weights   04/03/21 0500 04/08/21 0413 04/13/21 0426  Weight: 68.5 kg 69.8 kg 69.2 kg    Examination:  General exam: Appears calm and comfortable Respiratory system: Clear to auscultation. Respiratory effort normal. Cardiovascular system: S1 & S2 heard, RRR. No murmurs, rubs, gallops or clicks. Gastrointestinal system: Abdomen is nondistended, soft and nontender. No organomegaly or masses felt. Normal bowel sounds heard. Central nervous system: Alert and oriented to person and that she is in the hospital. Musculoskeletal: No edema. No calf tenderness Skin: No cyanosis. No rashes Psychiatry: Blunt affect     Data Reviewed: I have personally reviewed following labs and imaging studies  CBC Lab Results  Component Value Date   WBC 10.5 04/12/2021   RBC 4.29 04/12/2021   HGB 12.8 04/12/2021   HCT 38.6 04/12/2021   MCV 90.0 04/12/2021   MCH 29.8 04/12/2021   PLT 434 (H) 04/12/2021   MCHC 33.2 04/12/2021   RDW 13.1 04/12/2021   LYMPHSABS 4.2 (H) 04/12/2021   MONOABS 1.0 04/12/2021   EOSABS 0.4 04/12/2021   BASOSABS 0.1 04/12/2021     Last metabolic panel Lab Results  Component Value Date   NA 139 04/12/2021   K 3.8 04/12/2021   CL 103 04/12/2021   CO2 28  04/12/2021   BUN 24 (H) 04/12/2021   CREATININE 0.91 04/12/2021   GLUCOSE 156 (H) 04/12/2021   GFRNONAA >60 04/12/2021   GFRAA >60 12/12/2017   CALCIUM 9.9 04/12/2021   PHOS 4.3 03/31/2021   PROT 6.4 (L) 04/12/2021   ALBUMIN 3.0 (L) 04/12/2021   BILITOT 0.5 04/12/2021   ALKPHOS 65 04/12/2021   AST 10 (L) 04/12/2021   ALT 10 04/12/2021   ANIONGAP 8 04/12/2021    CBG (last 3)  Recent Labs    04/13/21 0424 04/13/21 0727 04/13/21 1134  GLUCAP 104* 159* 483*     GFR: Estimated Creatinine Clearance: 66.9 mL/min (by C-G formula based on SCr of 0.91 mg/dL).   Radiology Studies: No results found.  Scheduled Meds:  (feeding supplement) PROSource Plus  30 mL Oral TID BM   amLODipine  5 mg Oral Daily   enoxaparin (LOVENOX) injection  40 mg Subcutaneous Q24H   feeding supplement (GLUCERNA SHAKE)  237 mL Oral TID BM   insulin aspart  0-9 Units Subcutaneous Q4H   insulin aspart  2 Units Subcutaneous TID WC   insulin glargine-yfgn  10 Units Subcutaneous Daily   And   insulin glargine-yfgn  10 Units Subcutaneous QHS   metoprolol tartrate  50 mg Oral  BID   multivitamin  15 mL Oral Daily   OLANZapine zydis  5 mg Oral QHS   Continuous Infusions:   LOS: 91 days     Jacquelin Hawking, MD [facility] 04/13/2021, 2:11 PM  If 7PM-7AM, please contact night-coverage [email or web address]

## 2021-04-14 LAB — GLUCOSE, CAPILLARY
Glucose-Capillary: 214 mg/dL — ABNORMAL HIGH (ref 70–99)
Glucose-Capillary: 243 mg/dL — ABNORMAL HIGH (ref 70–99)
Glucose-Capillary: 248 mg/dL — ABNORMAL HIGH (ref 70–99)
Glucose-Capillary: 334 mg/dL — ABNORMAL HIGH (ref 70–99)
Glucose-Capillary: 410 mg/dL — ABNORMAL HIGH (ref 70–99)
Glucose-Capillary: 66 mg/dL — ABNORMAL LOW (ref 70–99)
Glucose-Capillary: 91 mg/dL (ref 70–99)

## 2021-04-14 MED ORDER — INSULIN GLARGINE-YFGN 100 UNIT/ML ~~LOC~~ SOLN
14.0000 [IU] | Freq: Every day | SUBCUTANEOUS | Status: DC
  Administered 2021-04-14 – 2021-04-15 (×2): 14 [IU] via SUBCUTANEOUS
  Filled 2021-04-14 (×2): qty 0.14

## 2021-04-14 MED ORDER — INSULIN GLARGINE-YFGN 100 UNIT/ML ~~LOC~~ SOLN
8.0000 [IU] | Freq: Every day | SUBCUTANEOUS | Status: DC
  Administered 2021-04-14: 8 [IU] via SUBCUTANEOUS
  Filled 2021-04-14 (×2): qty 0.08

## 2021-04-14 NOTE — TOC Progression Note (Signed)
Transition of Care [facility]) - Progression Note    Patient Details  Name: Jaelen Gellerman MRN: [id number] Date of Birth: Apr 09, 1966  Transition of Care [facility]) CM/SW Contact  Janae Bridgeman, RN Phone Number: 04/14/2021, 10:42 AM  Clinical Narrative:    Case management spoke with Sarina Ill, CM with [facility] and she is going to review the patient for possible bed offer at this time.  No other SNf facilities have offered a bed to the patient at this time.  CM resent clinicals in the hub for [place] with [facility] to review including progress notes and FL2.  CM and MSW with DTP Team will continue to follow the patient for [facility] placement.   Expected Discharge Plan: Hospice Medical Facility Barriers to Discharge: Inadequate or no insurance, Family Issues  Expected Discharge Plan and Services Expected Discharge Plan: Hospice Medical Facility In-house Referral: Clinical Social Work Discharge Planning Services: CM Consult Post Acute Care Choice: Skilled Nursing Facility Living arrangements for the past 2 months: Single Family Home Expected Discharge Date: 01/16/21               DME Arranged: [name] rolling DME Agency: [employer] Date DME Agency Contacted: 01/20/21 Time DME Agency Contacted: 1435 Representative spoke with at DME Agency: [name]             Social Determinants of Health (SDOH) Interventions    Readmission Risk Interventions Readmission Risk Prevention Plan 01/14/2021  Transportation Screening Complete  PCP or Specialist Appt within 3-5 Days (No Data)  Social Work Consult for Recovery Care Planning/Counseling Complete  Palliative Care Screening Not Applicable  Medication Review [facility]) Referral to Pharmacy  Some recent data might be hidden

## 2021-04-14 NOTE — Progress Notes (Addendum)
[facility] PROGRESS NOTE  Carolyn Norton CBJ:[id number] DOB: 03/21/1966 DOA: 01/11/2021 PCP: [name], FNP            March 27, 2021     Status: Remains inpatient appropriate because:Unsafe d/c plan-due to patient's underlying poor mental status short-term memory deficits she will require 24/7 care after discharge  Dispo:  Patient From:  Home  Planned Disposition: SNF w/ Hospice follow up at facility  Medically stable for discharge:  yes  Barriers to DC: Medicaid still pending; prior history of substance abuse and underlying documented bipolar disorder             Difficult to place: Yes  Level of care: Med-Surg  Code Status: DNR Family Communication: Father 8/03.  Updated on patient's improved mental status and suspicions that the low-dose Zyprexa that was initiated for anorexia has actually improved her mental status.  Explained that we are not certain what is the cause of her changes but clearly this medication appears to be improving her overall alertness. DVT prophylaxis: Lovenox COVID vaccination status: Unknown-post COVID infection 09/06/20   HPI: 55 year old female past medical history of diabetes mellitus type 2, bipolar disorder type I, previous COVID infection, polysubstance abuse that includes cocaine.  She presented from home where she lives with her parents.  Her presenting symptoms included generalized weakness, multiple falls and confusion.  Family confirmed that she had been having the symptoms for at least a month with last use of crack cocaine 1 month prior to presentation.  Ever since that time she had not been doing well.  She would also be having issues with hyperglycemia.  In the ER she was diagnosed with severe dehydration, hyperglycemia and metabolic encephalopathy.  Since admission patient has been evaluated by the psychiatric team who has deemed her alert and oriented and having capacity to make decisions.  Patient is agreeable to  placement at a rehab.  Currently she has severe physical deconditioning which was POA and PT is recommending SNF.  Because of her history of polysubstance abuse been difficult to obtain an SNF bed.  Initially her family was agreeable to taking her back in home when she is able to ambulate well enough to get back and forth for meals into the bathroom.  Unfortunately her cognitive and physical status has progressively declined.  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  Palliative medicine assisted but have signed off. Patient's previous advanced directives stated she did not want a PEG tube if she were unable to eat. Unfortunately no improvement with short term enteral feeds. Cortrak dc'd. Famliy not ready for comfort measures.   Beginning on 8/2 patient had improved mentation.  Likely 2/2 recent addition of Zyprexa  Subjective: More alert today.  Denies any issues such as trouble breathing or abdominal pain.  Has not eaten breakfast yet.  Remains confused.  Objective: Vitals:   04/13/21 2045 04/14/21 0447  BP: 140/86 117/68  Pulse: (!) 102 98  Resp:  14  Temp: 98.6 F (37 C) 98 F (36.7 C)  SpO2: 100% 98%    Intake/Output Summary (Last 24 hours) at 04/14/2021 0740 Last data filed at 04/13/2021 2045 Gross per 24 hour  Intake 360 ml  Output 700 ml  Net -340 ml    Filed Weights   04/03/21 0500 04/08/21 0413 04/13/21 0426  Weight: 68.5 kg 69.8 kg 69.2 kg    Exam: Constitutional: No acute distress, alert  Respiratory: Anterior lung sounds remain clear.  No increased work of breathing at rest.  Room air Cardiac: S1-S2, no peripheral edema, regular pulse Abdomen: LBM 8/6, soft nondistended.  Normoactive bowel sounds.  General oral intake. Neurologic: Cranial nerves are grossly intact.  More drowsy today but patient has awareness of that.  No focal neurological deficits.  Able to squeeze hands briskly and  equally when asked although strength still 3/5 Psychiatric: Awake and oriented to name and place only.  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   Thorough evaluation revealed symptoms not related to NPH MRI of her spine without any abnormalities to explain her progressive lower extremity weakness. Neurology suspects chronic progressive neurological decline 2/2 longstanding drug abuse. 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  Repeat EEG and MRI unchanged from prior studies Improvement in mentation or addition of Zyprexa  Dysphagia/failure to thrive secondary to multifactorial neurological condition No improvement after Megace. No improvement after 10 days of cortrack tube feedings.  No PEG per patient recently documented wishes.   7/27 PMT added low-dose Zyprexa as appetite stimulant and noted w/ improvement in mentation  Acute hypoxemic respiratory failure with underlying COPD Stable on room air  Diabetes mellitus 2 with hyperglycemia on insulin Preadmission hemoglobin A1c greater than 11 CBGs improved after adjustments in long-acting insulin.  As of 8/9 will increase a.m. dose of Lantus to 18 and continue p.m. dose of 8 Continue SSI.  History of bipolar 1 disorder/polysubstance abuse/cognitive impairment Continue to hold home escitalopram, Depakote and Neurontin secondary to persistent altered mentation and bipolar symptoms not present Given improvement in mental status will continue Zyprexa 5 mg to treat her bipolar disorder.   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 NPH NPH ruled out as above and is likely secondary to atrophy and generalized volume loss  Dyslipidemia Hold statin until oral intake improves  Physical deconditioning/bilateral heel cord shortening Continue bilateral PRAFO boots on every 4 hours and off every 4 hours Given progressive neurological decline unable to participate with therapies    Other problems: GERD Oral PPI on hold while n.p.o.  Hypokalemia/hypomagnesemia Resolved  E. Coli [name] UTI Treated Use purewick only at bedtime    Data Reviewed: Basic Metabolic Panel: Recent Labs  Lab 04/12/21 0217  NA 139  K 3.8  CL 103  CO2 28  GLUCOSE 156*  BUN 24*  CREATININE 0.91  CALCIUM 9.9   Liver Function Tests: Recent Labs  Lab 04/12/21 0217  AST 10*  ALT 10  ALKPHOS 65  BILITOT 0.5  PROT 6.4*  ALBUMIN 3.0*     No results for input(s): LIPASE, AMYLASE in the last 168 hours. No results for input(s): AMMONIA in the last 168 hours. CBC: Recent Labs  Lab 04/12/21 0217  WBC 10.5  NEUTROABS 4.8  HGB 12.8  HCT 38.6  MCV 90.0  PLT 434*    Cardiac Enzymes: No results for input(s): CKTOTAL, CKMB, CKMBINDEX, TROPONINI in the last 168 hours. BNP (last 3 results) Recent Labs    03/24/21 0856 03/25/21 0203 03/26/21 0222  BNP 29.2 33.9 37.9    ProBNP (last 3 results) No results for input(s): PROBNP in the last 8760 hours.  CBG: Recent  Labs  Lab 04/13/21 1655 04/13/21 2042 04/13/21 2316 04/14/21 0414 04/14/21 0440  GLUCAP 479* 520* 312* 66* 91    No results found for this or any previous visit (from the past 240 hour(s)).     Studies: No results found.  Scheduled Meds:  (feeding supplement) PROSource Plus  30 mL Oral TID BM   amLODipine  5 mg Oral Daily   enoxaparin (LOVENOX) injection  40 mg Subcutaneous Q24H   feeding supplement (GLUCERNA SHAKE)  237 mL Oral TID BM   insulin aspart  0-9 Units Subcutaneous Q4H   insulin aspart  2 Units Subcutaneous TID WC   insulin glargine-yfgn  10 Units Subcutaneous Daily   And   insulin glargine-yfgn  10 Units Subcutaneous QHS   metoprolol tartrate  50 mg Oral BID   multivitamin  15 mL Oral Daily   OLANZapine zydis  5 mg Oral QHS   Continuous Infusions:      Principal Problem:   Bipolar 1 disorder, depressed, full remission (HCC) Active Problems:   Bipolar 1 disorder (HCC)   Hypertension   Generalized weakness   Controlled type 2 diabetes mellitus with hyperglycemia, with long-term current use of insulin (HCC)   Hyperlipidemia   NPH (normal pressure hydrocephalus) (HCC)   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   Protein-calorie malnutrition, severe   Consultants: Psychiatry Neurology Neurosurgery  Procedures: None  Antibiotics: Anti-infectives (From admission, onward)    Start     Dose/Rate Route Frequency Ordered Stop   03/06/21 0900  ceFAZolin (ANCEF) IVPB 1 g/50 mL premix        1 g 100 mL/hr over 30 Minutes Intravenous Every 8 hours 03/06/21 0801 03/10/21 0559   03/04/21 1430  cefTRIAXone (ROCEPHIN) 1 g in sodium chloride 0.9 % 100 mL IVPB  Status:  Discontinued        1 g 200 mL/hr over 30 Minutes Intravenous Every 24 hours 03/04/21 1335 03/06/21 0800   02/14/21 0828  vancomycin (VANCOREADY) IVPB 1000 mg/200 mL  Status:  Discontinued        1,000 mg 200 mL/hr over 60 Minutes Intravenous 60 min pre-op 02/14/21 0828 03/02/21 1139   02/13/21  2115  cefTRIAXone (ROCEPHIN) 1 g in sodium chloride 0.9 % 100 mL IVPB  Status:  Discontinued        1 g 200 mL/hr over 30 Minutes Intravenous Every 24 hours 02/13/21 2016 02/16/21 0844        Time spent: 15 minutes    [name] ANP  [facility] 7 am - 330 pm/M-F for direct patient care and secure chat Please refer to Amion for contact info 92  days

## 2021-04-15 LAB — GLUCOSE, CAPILLARY
Glucose-Capillary: 214 mg/dL — ABNORMAL HIGH (ref 70–99)
Glucose-Capillary: 152 mg/dL — ABNORMAL HIGH (ref 70–99)
Glucose-Capillary: 156 mg/dL — ABNORMAL HIGH (ref 70–99)
Glucose-Capillary: 161 mg/dL — ABNORMAL HIGH (ref 70–99)
Glucose-Capillary: 325 mg/dL — ABNORMAL HIGH (ref 70–99)
Glucose-Capillary: 172 mg/dL — ABNORMAL HIGH (ref 70–99)
Glucose-Capillary: 91 mg/dL (ref 70–99)

## 2021-04-15 MED ORDER — INSULIN GLARGINE-YFGN 100 UNIT/ML ~~LOC~~ SOLN
8.0000 [IU] | Freq: Every day | SUBCUTANEOUS | Status: DC
  Administered 2021-04-15 – 2021-04-21 (×7): 8 [IU] via SUBCUTANEOUS
  Filled 2021-04-15 (×8): qty 0.08

## 2021-04-15 MED ORDER — INSULIN GLARGINE-YFGN 100 UNIT/ML ~~LOC~~ SOLN
18.0000 [IU] | Freq: Every day | SUBCUTANEOUS | Status: DC
  Administered 2021-04-16 – 2021-04-22 (×7): 18 [IU] via SUBCUTANEOUS
  Filled 2021-04-15 (×7): qty 0.18

## 2021-04-15 NOTE — Progress Notes (Signed)
Physical Therapy Treatment Patient Details Name: Carolyn Norton MRN: [id number] DOB: 1965/12/20 Today's Date: 04/15/2021    History of Present Illness Pt is a 55 y.o. female initially admitted nearly 3 months ago on 01/11/21 with multiple falls, AMS, weakness. Workup for severe dehydration, DKA and metabolic encephalopathy. 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 7/1 underwent lumbar puncture and drained large volume of CSF to assess for potential improvement in symptomology (none noted);  7/2 MRI of the brain, cervical, thoracic and lumbar spine with no acute changes to explain LE weakness. PT signed off 7/12 secondary to lack of progress and further cognitive/functional decline. Cortrak placed 7/15 secondary to poor [email or web address]. Family declining comfort measures. On 8/2 patient with increased alertness per MD after beginning medication for anorexia and PT/OT reiniated. PMH includes DM2, COPD, CKD3, GERD, bipolar disorder, COVID-19, polysubstance abuse including cocaine.    PT Comments    Pt making excellent progress today with some goals met and updated.  She was able to roll with mod A and supine to sit with light mod A.  Sat EOB for 15 mins and able to progress to min guard-min A level.  Stand pivot to chair with mod A x 2.  Attempted with STEDY (but Denna Haggard not available) but unable to tuck buttock enough to place pads. Pt following basic commands for exercises and transfers.  Continue plan of care     Follow Up Recommendations  SNF     Equipment Recommendations  Hospital bed;Wheelchair (measurements PT);Other (comment);Wheelchair cushion (measurements PT) (hoyer)    Recommendations for Other Services       Precautions / Restrictions Precautions Precautions: Fall    Mobility  Bed Mobility Overal bed mobility: Needs Assistance Bed Mobility: Rolling Rolling: Mod assist Sidelying to sit: Mod assist;+2 for physical assistance       General bed  mobility comments: Pt initiating roll and sitting with verbal cues.  Required mod A for trunk due to posterior lean    Transfers Overall transfer level: Needs assistance Equipment used: Rolling walker (2 wheeled) Transfers: Sit to/from [facility] Sit to Stand: Mod assist;+2 physical assistance Stand pivot transfers: Mod assist;+2 physical assistance       General transfer comment: Attempted sit to stand x 4 into STEDY Clarise Cruz STEDY not available) but pt unable to tuck buttock enough to get paddles down.  Performed stand pivot to recliner with mod A of 2  Ambulation/Gait             General Gait Details: unable   Stairs             Wheelchair Mobility    Modified Rankin (Stroke Patients Only)       Balance Overall balance assessment: Needs assistance Sitting-balance support: Feet unsupported;Bilateral upper extremity supported Sitting balance-Leahy Scale: Poor Sitting balance - Comments: Pt sat EOB for 15 mins.  Pt with posterior and L lean requiring mod A initially but progressed to min guard-min A by working on leaning anteriorly by having pt place arms on chair armrest placed in front and then backing hands off to knees once stable on chair.  Leaning to L could somewhat correct with verbal and visual cues.  Tried to have pt lean onto R elbow but pt somewhat resisting and only partially able to lean R. Expressed fear of falling.   Standing balance support: During functional activity;Bilateral upper extremity supported Standing balance-Leahy Scale: Poor Standing balance comment: Requiring UE support and mod A  x 2 - posterior lean; stood x 4 form 5-10 sec each                            Cognition Arousal/Alertness: Awake/alert Behavior During Therapy: WFL for tasks assessed/performed Overall Cognitive Status: Impaired/Different from baseline                   Orientation Level: Disoriented to;Time;Situation Current Attention  Level: Sustained Memory: Decreased short-term memory;Decreased recall of precautions Following Commands: Follows one step commands consistently Safety/Judgement: Decreased awareness of deficits;Decreased awareness of safety Awareness: Intellectual Problem Solving: Slow processing;Decreased initiation;Difficulty sequencing;Requires verbal cues;Requires tactile cues General Comments: Pt alert and smiling at arrival.  Resonds with yes/no/what/I know.  Followed commands      Exercises General Exercises - Lower Extremity Ankle Circles/Pumps: Both;5 reps;Supine;AAROM Long Arc Quad: AAROM;5 reps;Both;Supine Heel Slides: AAROM;Both;5 reps;Supine    General Comments        Pertinent Vitals/Pain Pain Assessment: No/denies pain    Home Living                      Prior Function            PT Goals (current goals can now be found in the care plan section) Acute Rehab PT Goals Patient Stated Goal: None stated. PT Goal Formulation: Patient unable to participate in goal setting Time For Goal Achievement: 04/29/21 Potential to Achieve Goals: Fair Progress towards PT goals: Goals met and updated - see care plan    Frequency    Min 2X/week      PT Plan Current plan remains appropriate    Co-evaluation              AM-PAC PT "6 Clicks" Mobility   Outcome Measure  Help needed turning from your back to your side while in a flat bed without using bedrails?: A Lot Help needed moving from lying on your back to sitting on the side of a flat bed without using bedrails?: A Lot Help needed moving to and from a bed to a chair (including a wheelchair)?: Total Help needed standing up from a chair using your arms (e.g., wheelchair or bedside chair)?: Total Help needed to walk in hospital room?: Total Help needed climbing 3-5 steps with a railing? : Total 6 Click Score: 8    End of Session Equipment Utilized During Treatment: Gait belt Activity Tolerance: Patient tolerated  treatment well Patient left: with chair alarm set;in chair;with call bell/phone within reach Nurse Communication: Mobility status;Need for lift equipment PT Visit Diagnosis: Muscle weakness (generalized) (M62.81);Repeated falls (R29.6);Difficulty in walking, not elsewhere classified (R26.2);Unsteadiness on feet (R26.81)     Time: 4665-9935 PT Time Calculation (min) (ACUTE ONLY): 29 min  Charges:  $Therapeutic Activity: 8-22 mins $Neuromuscular Re-education: 8-22 mins                     Abran Richard, PT Acute Rehab Services Pager [phone] [facility] [phone]    Karlton Lemon 04/15/2021, 11:57 AM

## 2021-04-15 NOTE — Progress Notes (Signed)
Occupational Therapy Treatment Patient Details Name: Carolyn Norton MRN: [id number] DOB: 10-20-1965 Today's Date: 04/15/2021    History of present illness Pt is a 55 y.o. female initially admitted nearly 3 months ago on 01/11/21 with multiple falls, AMS, weakness. Workup for severe dehydration, DKA and metabolic encephalopathy. 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 7/1 underwent lumbar puncture and drained large volume of CSF to assess for potential improvement in symptomology (none noted);  7/2 MRI of the brain, cervical, thoracic and lumbar spine with no acute changes to explain LE weakness. PT signed off 7/12 secondary to lack of progress and further cognitive/functional decline. Cortrak placed 7/15 secondary to poor [email or web address]. Family declining comfort measures. On 8/2 patient with increased alertness per MD after beginning medication for anorexia and PT/OT reiniated. PMH includes DM2, COPD, CKD3, GERD, bipolar disorder, COVID-19, polysubstance abuse including cocaine.   OT comments  Patient making great progress toward goals this date. Seated in recliner upon entry. Patient following commands with more accuracy but noted continued blank stare followed by periods of decreased command following. Patient completed self-feeding task with Mod A and intermittent hand over hand. Required Mod cues overall for initiation/sequencing/termination. Patient then preformed 2 seated grooming tasks in recliner at sink level with Mod A overall and Mod cues for initiation/sequencing. Please refer below for more details. OT will continue to follow acutely.    Follow Up Recommendations  Other (comment);SNF (Long-term/custodial care)    Equipment Recommendations  Other (comment) (TBD)    Recommendations for Other Services      Precautions / Restrictions Precautions Precautions: Fall Restrictions Weight Bearing Restrictions: No       Mobility Bed Mobility Overal bed  mobility: Needs Assistance Bed Mobility: Rolling Rolling: Mod assist Sidelying to sit: Mod assist;+2 for physical assistance       General bed mobility comments: Seated in recliner upon entry.    Transfers Overall transfer level: Needs assistance Equipment used: Rolling walker (2 wheeled) Transfers: Sit to/from [employer] Sit to Stand: Mod assist;+2 physical assistance Stand pivot transfers: Mod assist;+2 physical assistance       General transfer comment: Attempted sit to stand x 4 into STEDY Huntley Dec STEDY not available) but pt unable to tuck buttock enough to get paddles down.  Performed stand pivot to recliner with mod A of 2    Balance Overall balance assessment: Needs assistance Sitting-balance support: Feet unsupported;Bilateral upper extremity supported Sitting balance-Leahy Scale: Poor Sitting balance - Comments: Pt sat EOB for 15 mins.  Pt with posterior and L lean requiring mod A initially but progressed to min guard-min A by working on leaning anteriorly by having pt place arms on chair armrest placed in front and then backing hands off to knees once stable on chair.  Leaning to L could somewhat correct with verbal and visual cues.  Tried to have pt lean onto R elbow but pt somewhat resisting and only partially able to lean R. Expressed fear of falling.   Standing balance support: During functional activity;Bilateral upper extremity supported Standing balance-Leahy Scale: Poor Standing balance comment: Requiring UE support and mod A x 2 - posterior lean; stood x 4 form 5-10 sec each                           ADL either performed or assessed with clinical judgement   ADL Overall ADL's : Needs assistance/impaired Eating/Feeding: Moderate assistance;Sitting Eating/Feeding Details (indicate cue type and  reason): Initially able to soop items on tray and bring to mouth without cueing. Reports distaste for many items on tray but in agreement with  eating magic cup. Patient able to take several scoops followed by period of blank stare lasting several seconds after which time patient seemingly forgetting how to use utensils requiring hand over hand assist and Max cues. Grooming: Moderate assistance;Sitting Grooming Details (indicate cue type and reason): Mod A to wash face and comb hair seated in recliner at sink level; Mod cues for initiation/sequencing. Requires increased time/effort.                                     Vision       Perception     Praxis      Cognition Arousal/Alertness: Awake/alert Behavior During Therapy: WFL for tasks assessed/performed Overall Cognitive Status: Impaired/Different from baseline Area of Impairment: Attention;Following commands;Problem solving                 Orientation Level: Disoriented to;Time;Situation Current Attention Level: Sustained Memory: Decreased short-term memory;Decreased recall of precautions Following Commands: Follows one step commands inconsistently;Follows one step commands with increased time Safety/Judgement: Decreased awareness of deficits;Decreased awareness of safety Awareness: Intellectual Problem Solving: Slow processing;Decreased initiation;Difficulty sequencing;Requires verbal cues;Requires tactile cues General Comments: Patient awake/alert upon entry; oriented to person only. Reports date as March 2002. Patient following 1-step verbal commands with increased accuracy this date.Able to follow commands necessary for assessment of AROM. During self-feeding task patient seemingly forgot how to use her spoon after having taken several bites already. Went from requiring supervision A without cues to requiring hand over hand assist and Max cues for sequencing task. Periods of blank stare intermittently.        Exercises General Exercises - Lower Extremity Ankle Circles/Pumps: Both;5 reps;Supine;AAROM Long Arc Quad: AAROM;5 reps;Both;Supine Heel  Slides: AAROM;Both;5 reps;Supine   Shoulder Instructions       General Comments      Pertinent Vitals/ Pain       Pain Assessment: No/denies pain  Home Living                                          Prior Functioning/Environment              Frequency  Min 1X/week        Progress Toward Goals  OT Goals(current goals can now be found in the care plan section)     Acute Rehab OT Goals Patient Stated Goal: None stated. OT Goal Formulation: Patient unable to participate in goal setting Time For Goal Achievement: 04/23/21 Potential to Achieve Goals: Fair ADL Goals Pt Will Perform Eating: with set-up;sitting Pt Will Perform Grooming: with set-up;sitting Pt Will Perform Upper Body Dressing: with min assist;sitting Pt Will Perform Lower Body Dressing: with mod assist;sit to/from stand Pt Will Transfer to Toilet: with mod assist;bedside commode Pt Will Perform Toileting - Clothing Manipulation and hygiene: with mod assist;sit to/from stand;sitting/lateral leans Pt/caregiver will Perform Home Exercise Program: Increased strength;Both right and left upper extremity;With theraband;With written HEP provided Additional ADL Goal #1: Patietn will follow 1-step verbal commands with 95% accuracy in prep for ADLs at bed level vs. in sitting. Additional ADL Goal #2: Patient will follow 1-step verbal commands with 50% accuracy in prep for ADLs. Additional ADL  Goal #3: Patient will complete 3/3 grooming tasks in supported sitting position with Mod A and Max multimodal cues.  Plan Discharge plan remains appropriate;Frequency remains appropriate    Co-evaluation                 AM-PAC OT "6 Clicks" Daily Activity     Outcome Measure   Help from another person eating meals?: A Lot Help from another person taking care of personal grooming?: Total Help from another person toileting, which includes using toliet, bedpan, or urinal?: Total Help from another  person bathing (including washing, rinsing, drying)?: Total Help from another person to put on and taking off regular upper body clothing?: Total Help from another person to put on and taking off regular lower body clothing?: Total 6 Click Score: 7    End of Session    OT Visit Diagnosis: Unsteadiness on feet (R26.81);Other abnormalities of gait and mobility (R26.89);Muscle weakness (generalized) (M62.81);Other symptoms and signs involving cognitive function;Other symptoms and signs involving the nervous system (R29.898)   Activity Tolerance Patient tolerated treatment well   Patient Left in chair;with call bell/phone within reach;with chair alarm set   Nurse Communication Other (comment);Mobility status (Response to treatement)        Time: 2671-2458 OT Time Calculation (min): 46 min  Charges: OT General Charges $OT Visit: 1 Visit OT Treatments $Self Care/Home Management : 23-37 mins $Therapeutic Activity: 8-22 mins  Winner Valeriano H. OTR/L Supplemental OT, Department of rehab services [phone]   Shareese Macha R H. 04/15/2021, 12:47 PM

## 2021-04-15 NOTE — Progress Notes (Signed)
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 -- ongoing  GOAL:  Patient will meet greater than or equal to 90% of their needs -- progressing  MONITOR:  PO intake, Supplement acceptance, Labs, Weight trends, TF tolerance  REASON FOR ASSESSMENT:  Consult Calorie Count, Poor PO  ASSESSMENT:  55 year old female past medical history of diabetes mellitus type 2, bipolar disorder type I, previous COVID infection, polysubstance abuse that includes cocaine.  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 Pt has been at [facility] for the past fifty-three days is not eating or drinking sufficiently. Palliative care has been asked to get re-involved to further discuss goals of care in the setting of progressive failure to thrive and generalized weakness.  7/04 - megace initiated 7/09 - megace dose increased to BID 7/15 - cortrak placed (tip gastric per xray) 7/23 - cortrak removed 7/25 repeat EEG with moderate diffuse encephalopathy though no evidence of seizures or other abnormality noted  Pt was initiated on low-dose Zyprexa and, per MD, mental status has improved with this addition. CM/SW still working on placement for pt, noted possible bed offer from [facility].   PO Intake: 5-50% x 3 recorded meals since last RD assessment (~28% average meal intake)  Per RN, pt continues to do well with oral nutrition supplements. Continue current nutrition plan of care as pt does not wish to have TF.   Medications:  (feeding supplement)  PROSource Plus  30 mL Oral TID BM   amLODipine  5 mg Oral Daily   enoxaparin (LOVENOX) injection  40 mg Subcutaneous Q24H   feeding supplement (GLUCERNA SHAKE)  237 mL Oral TID BM   insulin aspart  0-9 Units Subcutaneous Q4H   insulin aspart  2 Units Subcutaneous TID WC   [START ON 04/16/2021] insulin glargine-yfgn  18 Units Subcutaneous Daily   And   insulin glargine-yfgn  8 Units Subcutaneous QHS   metoprolol tartrate  50 mg Oral BID   multivitamin  15 mL Oral Daily   OLANZapine zydis  5 mg Oral QHS   Labs: Recent Labs  Lab 04/12/21 0217  NA 139  K 3.8  CL 103  CO2 28  BUN 24*  CREATININE 0.91  CALCIUM 9.9  GLUCOSE 156*  CBGs [phone] (Diabetes Coordinator following)  UOP: x24 hours Stool: 1x unmeasured occurrence x24 hours I/O: -12.4L since admit  Admit weight: 67.5 kg Current weight: 69.2 kg  Diet Order:   Diet Order             DIET DYS 2 Room service appropriate? No; Fluid consistency: Thin  Diet effective now                  EDUCATION NEEDS:  No education needs have been identified at this time  Skin:  Skin Assessment: Reviewed RN Assessment  Last BM:  8/08  Height:  Ht Readings from Last 1 Encounters:  03/15/21 5\' 6"  (1.676 m)   Weight:  Wt Readings from Last 1 Encounters:  04/13/21 69.2 kg   Ideal Body Weight:  59.1 kg  BMI:  Body mass index is 24.62 kg/m.  Estimated Nutritional Needs:  Kcal:  1850-2050 Protein:  90-110 grams Fluid:  >/= 1.8 L    Eugene Gavia, MS, RD, LDN (she/her/hers) RD pager number and weekend/on-call pager number located in Amion.

## 2021-04-15 NOTE — Progress Notes (Addendum)
SLP Cancellation Note  Patient Details Name: Carolyn Norton MRN: [id number] DOB: 10/27/1965   Cancelled treatment:     New orders received for swallow evaluation. D/W [name] - per last SLP notes, pt's diet was not advanced due to cognitive issues only - no biomechanical dysphagia.  Will defer assessment at this time. Advance diet per medical staff - if pt is demonstrating difficulty with swallowing, we will be happy to re-evaluate.  Thank you, Maureen Duesing L. Samson Frederic, MA CCC/SLP Acute Rehabilitation Services Office number [phone] Pager [phone]       Blenda Mounts Laurice 04/15/2021, 1:23 PM

## 2021-04-16 LAB — GLUCOSE, CAPILLARY
Glucose-Capillary: 76 mg/dL (ref 70–99)
Glucose-Capillary: 209 mg/dL — ABNORMAL HIGH (ref 70–99)
Glucose-Capillary: 370 mg/dL — ABNORMAL HIGH (ref 70–99)
Glucose-Capillary: 152 mg/dL — ABNORMAL HIGH (ref 70–99)
Glucose-Capillary: 268 mg/dL — ABNORMAL HIGH (ref 70–99)
Glucose-Capillary: 203 mg/dL — ABNORMAL HIGH (ref 70–99)

## 2021-04-16 NOTE — Progress Notes (Signed)
[facility] PROGRESS NOTE  Carolyn Norton TIR:[id number] DOB: 12-02-1965 DOA: 01/11/2021 PCP: [name], FNP            March 27, 2021     Status: Remains inpatient appropriate because:Unsafe d/c plan-due to patient's underlying poor mental status short-term memory deficits she will require 24/7 care after discharge  Dispo:  Patient From:  Home  Planned Disposition: SNF w/ Hospice follow up at facility  Medically stable for discharge:  yes  Barriers to DC: Medicaid still pending; prior history of substance abuse and underlying documented bipolar disorder             Difficult to place: Yes  Level of care: Med-Surg  Code Status: DNR Family Communication: Father 8/03.  Updated on patient's improved mental status and suspicions that the low-dose Zyprexa that was initiated for anorexia has actually improved her mental status.  Explained that we are not certain what is the cause of her changes but clearly this medication appears to be improving her overall alertness. DVT prophylaxis: Lovenox COVID vaccination status: Unknown-post COVID infection 09/06/20   HPI: 55 year old female past medical history of diabetes mellitus type 2, bipolar disorder type I, previous COVID infection, polysubstance abuse that includes cocaine.  She presented from home where she lives with her parents.  Her presenting symptoms included generalized weakness, multiple falls and confusion.  Family confirmed that she had been having the symptoms for at least a month with last use of crack cocaine 1 month prior to presentation.  Ever since that time she had not been doing well.  She would also be having issues with hyperglycemia.  In the ER she was diagnosed with severe dehydration, hyperglycemia and metabolic encephalopathy.  Since admission patient has been evaluated by the psychiatric team who has deemed her alert and oriented and having capacity to make decisions.  Patient is agreeable to  placement at a rehab.  Currently she has severe physical deconditioning which was POA and PT is recommending SNF.  Because of her history of polysubstance abuse been difficult to obtain an SNF bed.  Initially her family was agreeable to taking her back in home when she is able to ambulate well enough to get back and forth for meals into the bathroom.  Unfortunately her cognitive and physical status has progressively declined.  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  Palliative medicine assisted but have signed off. Patient's previous advanced directives stated she did not want a PEG tube if she were unable to eat. Unfortunately no improvement with short term enteral feeds. Cortrak dc'd. Famliy not ready for comfort measures.   Beginning on 8/2 patient had improved mentation.  Likely 2/2 recent addition of Zyprexa  Subjective: Much more alert today.  Smiling and interacting appropriately.  States she has increased appetite now.  Objective: Vitals:   04/15/21 1921 04/16/21 0407  BP: 136/78 124/86  Pulse: 86 68  Resp: 18 16  Temp: 98.1 F (36.7 C) 97.8 F (36.6 C)  SpO2: 100% 100%    Intake/Output Summary (Last 24 hours) at 04/16/2021 0800 Last data filed at 04/15/2021 1922 Gross per 24 hour  Intake --  Output 200 ml  Net -200 ml    Filed Weights   04/03/21 0500 04/08/21 0413 04/13/21 0426  Weight: 68.5 kg 69.8 kg 69.2 kg    Exam: Constitutional: Awake, in no acute distress Respiratory: Lung sounds remain clear, stable on room  air, no wheezing Cardiac: S1-S2, normotensive, no peripheral edema pulse remains regular to auscultation Abdomen: LBM 8/6, nontender with normoactive bowel sounds.  Oral intake remains marginal Neurologic: Cranial nerves are grossly intact.  More drowsy today but patient has awareness of that.  No focal neurological deficits.  Able to squeeze hands briskly and equally when  asked although strength still 3/5 Psychiatric: Alert and oriented times name and place still not oriented to year.  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   Thorough evaluation revealed symptoms not related to NPH MRI of her spine without any abnormalities to explain her progressive lower extremity weakness. Neurology suspects chronic progressive neurological decline 2/2 longstanding drug abuse. 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  Repeat EEG and MRI unchanged from prior studies Improvement in mentation or addition of Zyprexa  Dysphagia/failure to thrive secondary to multifactorial neurological condition No improvement after Megace. No improvement after 10 days of cortrack tube feedings.  No PEG per patient recently documented wishes.   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  She was able to sit up in a chair for 15 minutes was able to progress to minimal guard minimum assist level.  She was also able to stand and pivot to the chair with moderate assist x2   Acute hypoxemic respiratory failure with underlying COPD Stable on room air  Diabetes mellitus 2 with hyperglycemia on insulin Preadmission hemoglobin A1c greater than 11 CBGs improved after adjustments  in long-acting insulin.  As of 8/9 will increase a.m. dose of Lantus to 18 and continue p.m. dose of 8 Continue SSI.  History of bipolar 1 disorder/polysubstance abuse/cognitive impairment Continue to hold home escitalopram, Depakote and Neurontin secondary to persistent altered mentation and bipolar symptoms not present Given improvement in mental status will continue Zyprexa 5 mg to treat her bipolar disorder.   Elevated blood pressure/New dx HTN/murmur No prior documented history of hypertension  Continue low-dose Norvasc 8/2 Echocardiogram this admission revealed no valvular abnormalities but was c/w diastolic dysfunction; murmur has resolved was likely 2/2 hyperdynamic state  Suspected constipation Given 1 dose of milk of magnesia w/ appropriate results  Ventriculomegaly/? NPH NPH ruled out as above and is likely secondary to atrophy and generalized volume loss  Dyslipidemia Hold statin until oral intake improves  Physical deconditioning/bilateral heel cord shortening Continue bilateral PRAFO boots on every 4 hours and off every 4 hours Given progressive neurological decline unable to participate with therapies    Other problems: GERD Oral PPI on hold while n.p.o.  Hypokalemia/hypomagnesemia Resolved  E. Coli [name] UTI Treated Use purewick only at bedtime    Data Reviewed: Basic Metabolic Panel: Recent Labs  Lab 04/12/21 0217  NA 139  K 3.8  CL 103  CO2 28  GLUCOSE 156*  BUN 24*  CREATININE 0.91  CALCIUM 9.9   Liver Function Tests: Recent Labs  Lab 04/12/21 0217  AST 10*  ALT 10  ALKPHOS  65  BILITOT 0.5  PROT 6.4*  ALBUMIN 3.0*     No results for input(s): LIPASE, AMYLASE in the last 168 hours. No results for input(s): AMMONIA in the last 168 hours. CBC: Recent Labs  Lab 04/12/21 0217  WBC 10.5  NEUTROABS 4.8  HGB 12.8  HCT 38.6  MCV 90.0  PLT 434*    Cardiac Enzymes: No results for input(s): CKTOTAL, CKMB, CKMBINDEX,  TROPONINI in the last 168 hours. BNP (last 3 results) Recent Labs    03/24/21 0856 03/25/21 0203 03/26/21 0222  BNP 29.2 33.9 37.9    ProBNP (last 3 results) No results for input(s): PROBNP in the last 8760 hours.  CBG: Recent Labs  Lab 04/15/21 1612 04/15/21 1943 04/15/21 2323 04/16/21 0320 04/16/21 0746  GLUCAP 325* 172* 91 76 209*    No results found for this or any previous visit (from the past 240 hour(s)).     Studies: No results found.  Scheduled Meds:  (feeding supplement) PROSource Plus  30 mL Oral TID BM   amLODipine  5 mg Oral Daily   enoxaparin (LOVENOX) injection  40 mg Subcutaneous Q24H   feeding supplement (GLUCERNA SHAKE)  237 mL Oral TID BM   insulin aspart  0-9 Units Subcutaneous Q4H   insulin aspart  2 Units Subcutaneous TID WC   insulin glargine-yfgn  18 Units Subcutaneous Daily   And   insulin glargine-yfgn  8 Units Subcutaneous QHS   metoprolol tartrate  50 mg Oral BID   multivitamin  15 mL Oral Daily   OLANZapine zydis  5 mg Oral QHS   Continuous Infusions:      Principal Problem:   Bipolar 1 disorder, depressed, full remission (HCC) Active Problems:   Bipolar 1 disorder (HCC)   Hypertension   Generalized weakness   Controlled type 2 diabetes mellitus with hyperglycemia, with long-term current use of insulin (HCC)   Hyperlipidemia   NPH (normal pressure hydrocephalus) (HCC)   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   Protein-calorie malnutrition, severe   Consultants: Psychiatry Neurology Neurosurgery  Procedures: None  Antibiotics: Anti-infectives (From admission, onward)    Start     Dose/Rate Route Frequency Ordered Stop   03/06/21 0900  ceFAZolin (ANCEF) IVPB 1 g/50 mL premix        1 g 100 mL/hr over 30 Minutes Intravenous Every 8 hours 03/06/21 0801 03/10/21 0559   03/04/21 1430  cefTRIAXone (ROCEPHIN) 1 g in sodium chloride 0.9 % 100 mL IVPB  Status:  Discontinued        1 g 200 mL/hr over 30 Minutes Intravenous  Every 24 hours 03/04/21 1335 03/06/21 0800   02/14/21 0828  vancomycin (VANCOREADY) IVPB 1000 mg/200 mL  Status:  Discontinued        1,000 mg 200 mL/hr over 60 Minutes Intravenous 60 min pre-op 02/14/21 0828 03/02/21 1139   02/13/21 2115  cefTRIAXone (ROCEPHIN) 1 g in sodium chloride 0.9 % 100 mL IVPB  Status:  Discontinued        1 g 200 mL/hr over 30 Minutes Intravenous Every 24 hours 02/13/21 2016 02/16/21 0844        Time spent: 15 minutes    [name] ANP  [facility] 7 am - 330 pm/M-F for direct patient care and secure chat Please refer to Amion for contact info 94  days

## 2021-04-17 LAB — GLUCOSE, CAPILLARY
Glucose-Capillary: 148 mg/dL — ABNORMAL HIGH (ref 70–99)
Glucose-Capillary: 134 mg/dL — ABNORMAL HIGH (ref 70–99)
Glucose-Capillary: 294 mg/dL — ABNORMAL HIGH (ref 70–99)
Glucose-Capillary: 250 mg/dL — ABNORMAL HIGH (ref 70–99)
Glucose-Capillary: 89 mg/dL (ref 70–99)

## 2021-04-17 NOTE — Progress Notes (Signed)
PROGRESS NOTE  Carolyn Norton FSF:[id number] DOB: 06/09/1966 DOA: 01/11/2021 PCP: [name], FNP   LOS: 95 days   Brief narrative: 55 year old female past medical history of diabetes mellitus type 2, bipolar disorder type I, previous COVID infection, polysubstance abuse that includes cocaine.  She presented from home where she lives with her parents.  Her presenting symptoms included generalized weakness, multiple falls and confusion.  Family confirmed that she had been having the symptoms for at least a month with last use of crack cocaine 1 month prior to presentation.  Ever since that time she had not been doing well.  She would also be having issues with hyperglycemia.  In the ER she was diagnosed with severe dehydration, hyperglycemia and metabolic encephalopathy.   Since admission patient has been evaluated by the psychiatric team who has deemed her alert and oriented and having capacity to make decisions.  Patient is agreeable to placement at a rehab.  Currently she has severe physical deconditioning which was POA and PT is recommending SNF.  Because of her history of polysubstance abuse been difficult to obtain an SNF bed.  Initially her family was agreeable to taking her back in home when she is able to ambulate well enough to get back and forth for meals into the bathroom.  Unfortunately her cognitive and physical status has progressively declined.  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  Palliative medicine assisted but have signed off. Patient's previous advanced directives stated she did not want a PEG tube if she were unable to eat. Unfortunately no improvement with short term enteral feeds. Cortrak dc'd. Famliy not ready for comfort measures.    Beginning on 8/2 patient had improved mentation.  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  History of longstanding drug use/history of chronic progressive neurological decline/long COVID syndrome.  EEG MRI unchanged from prior.  On Zyprexa.     Dysphagia/failure to thrive secondary to multifactorial neurological condition No improvement after Megace. No improvement after 10 days of cortrack tube feedings.  No PEG per patient recently documented wishes.   7/27 PMT added low-dose Zyprexa as appetite stimulant and noted w/ improvement in mentation   Acute hypoxemic respiratory failure with underlying COPD Currently stable on room air.   Diabetes mellitus 2 with hyperglycemia on insulin Prior to admission hemoglobin A1c greater than 11 CBGs improved after adjustments in long-acting insulin.  Continue sliding scale insulin Accu-Cheks  History of bipolar 1 disorder/polysubstance abuse/cognitive impairment Continue to hold home escitalopram, Depakote and Neurontin secondary to persistent altered mentation and bipolar symptoms not present Currently on Zyprexa 5 mg   Elevated blood pressure/New dx HTN/murmur On low-dose Norvasc. 8/2 Echocardiogram this admission revealed no valvular abnormalities but was c/w diastolic dysfunction; murmur has resolved was likely 2/2 hyperdynamic state   Suspected constipation Continue bowel regimen as necessary   Profound physical deconditioning/critical  illness  myopathy Continue PT OT.   Ventriculomegaly NPH ruled out as above and is likely secondary to atrophy and generalized volume loss   Dyslipidemia Statin currently on hold   Physical deconditioning/bilateral heel cord shortening Continue bilateral PRAFO boots on every 4 hours and off every 4 hours Given progressive neurological decline unable to participate with therapies   GERD Oral PPI as outpatient   Hypokalemia/hypomagnesemia Resolved   E. Coli [name] UTI Treated    DVT prophylaxis: enoxaparin (LOVENOX) injection 40 mg Start: 03/24/21 1000 SCD's Start: 02/14/21 0829   Code Status: DNR  Family Communication: None  Status is: Inpatient  Remains inpatient appropriate because: Disposition issues  Dispo:  Patient From: Home  Planned Disposition: Skilled nursing facility with hospice  Medically stable for discharge: Yes  Barriers to discharge medically still pending.  History of substance abuse and bipolar disorder.  Consultants: Psychiatry Neurology Neurosurgery  Procedures: None  Anti-infectives:  None  Anti-infectives (From admission, onward)    Start     Dose/Rate Route Frequency Ordered Stop   03/06/21 0900  ceFAZolin (ANCEF) IVPB 1 g/50 mL premix        1 g 100 mL/hr over 30 Minutes Intravenous Every 8 hours 03/06/21 0801 03/10/21 0559   03/04/21 1430  cefTRIAXone (ROCEPHIN) 1 g in sodium chloride 0.9 % 100 mL IVPB  Status:  Discontinued        1 g 200 mL/hr over 30 Minutes Intravenous Every 24 hours 03/04/21 1335 03/06/21 0800   02/14/21 0828  vancomycin (VANCOREADY) IVPB 1000 mg/200 mL  Status:  Discontinued        1,000 mg 200 mL/hr over 60 Minutes Intravenous 60 min pre-op 02/14/21 0828 03/02/21 1139   02/13/21 2115  cefTRIAXone (ROCEPHIN) 1 g in sodium chloride 0.9 % 100 mL IVPB  Status:  Discontinued        1 g 200 mL/hr over 30 Minutes Intravenous Every 24 hours 02/13/21 2016 02/16/21 0844      Subjective: Today, patient was seen  and examined at bedside.  Denies any shortness of breath abdominal pain nausea vomiting.  Objective: Vitals:   04/16/21 2051 04/17/21 0350  BP: 134/79 122/76  Pulse: 92 79  Resp: 12 16  Temp: 97.9 F (36.6 C) 99.5 F (37.5 C)  SpO2: 98% 96%    Intake/Output Summary (Last 24 hours) at 04/17/2021 0938 Last data filed at 04/17/2021 0900 Gross per 24 hour  Intake 400 ml  Output 700 ml  Net -300 ml   Filed Weights   04/03/21 0500 04/08/21 0413 04/13/21 0426  Weight: 68.5 kg 69.8 kg 69.2 kg   Body mass index is 24.62 kg/m.   Physical Exam: GENERAL: Patient is alert awake and  communicative, not in obvious distress. HENT: No scleral pallor or icterus. Pupils equally reactive to light. Oral mucosa is moist NECK: is supple, no gross swelling noted. CHEST: Clear to auscultation. No crackles or wheezes.  Diminished breath sounds bilaterally. CVS: S1 and S2 heard, no murmur. Regular rate and rhythm.  ABDOMEN: Soft, non-tender, bowel sounds are present. EXTREMITIES: No edema. CNS: Cranial nerves are intact.  Communicative. SKIN: warm and dry without rashes.  Data Review: I have personally reviewed the following laboratory data and studies,  CBC: Recent Labs  Lab 04/12/21 0217  WBC 10.5  NEUTROABS 4.8  HGB 12.8  HCT 38.6  MCV 90.0  PLT 434*   Basic Metabolic Panel: Recent Labs  Lab 04/12/21 0217  NA 139  K 3.8  CL 103  CO2 28  GLUCOSE 156*  BUN 24*  CREATININE 0.91  CALCIUM 9.9   Liver Function Tests: Recent Labs  Lab 04/12/21 0217  AST 10*  ALT 10  ALKPHOS 65  BILITOT 0.5  PROT 6.4*  ALBUMIN 3.0*   No results for input(s): LIPASE, AMYLASE in the last 168 hours. No results for input(s): AMMONIA in the last 168 hours. Cardiac Enzymes: No results for input(s): CKTOTAL, CKMB, CKMBINDEX, TROPONINI in the last 168 hours. BNP (last 3 results) Recent Labs    03/24/21 0856 03/25/21 0203 03/26/21 0222  BNP 29.2 33.9 37.9    ProBNP (last 3 results) No  results for input(s): PROBNP in the last 8760 hours.  CBG: Recent Labs  Lab 04/16/21 1637 04/16/21 2047 04/16/21 2259 04/17/21 0346 04/17/21 0722  GLUCAP 152* 268* 203* 148* 134*   No results found for this or any previous visit (from the past 240 hour(s)).   Studies: No results found.    Joycelyn Das, MD  [facility] 04/17/2021  If 7PM-7AM, please contact night-coverage

## 2021-04-17 NOTE — Progress Notes (Signed)
Physical Therapy Treatment Patient Details Name: Carolyn Norton MRN: [id number] DOB: Oct 16, 1965 Today's Date: 04/17/2021    History of Present Illness Pt is a 55 y.o. female initially admitted nearly 3 months ago on 01/11/21 with multiple falls, AMS, weakness. Workup for severe dehydration, DKA and metabolic encephalopathy. 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 7/1 underwent lumbar puncture and drained large volume of CSF to assess for potential improvement in symptomology (none noted);  7/2 MRI of the brain, cervical, thoracic and lumbar spine with no acute changes to explain LE weakness. PT signed off 7/12 secondary to lack of progress and further cognitive/functional decline. Cortrak placed 7/15 secondary to poor [email or web address]. Family declining comfort measures. On 8/2 patient with increased alertness per MD after beginning medication for anorexia and PT/OT reiniated. PMH includes DM2, COPD, CKD3, GERD, bipolar disorder, COVID-19, polysubstance abuse including cocaine.    PT Comments    Pt making gradual progress. She was able to take some steps with mod A of 2.  Tends to lean posteriorly with focus on leaning and reaching forward during session.  Pt with improved communication today - answering in 3-4 word sentences. Continue to progress as able.     Follow Up Recommendations  SNF     Equipment Recommendations  Hospital bed;Wheelchair (measurements PT);Other (comment);Wheelchair cushion (measurements PT) Water quality scientist)    Recommendations for Other Services       Precautions / Restrictions Precautions Precautions: Fall Precaution Comments: High fall risk    Mobility  Bed Mobility Overal bed mobility: Needs Assistance       Supine to sit: Mod assist     General bed mobility comments: Pt sliding legs to EOB with tactile cues, requiring mod A to lift trunk and scoot    Transfers Overall transfer level: Needs assistance Equipment used: 2 person hand held  assist Transfers: Sit to/from [employer] Sit to Stand: Mod assist;+2 physical assistance Stand pivot transfers: Mod assist;+2 physical assistance       General transfer comment: Sit to stand x 4 - mod A of 2 with max cues to lean forward, knees blocked, and cues/assist to tuck buttock and lift shoulders once up.  Ambulation/Gait Ambulation/Gait assistance: Mod assist;+2 physical assistance Gait Distance (Feet): 2 Feet Assistive device: 2 person hand held assist Gait Pattern/deviations: Step-to pattern;Decreased stride length;Shuffle Gait velocity: decreased   General Gait Details: Small shuffle steps to chair with assist.  Cues for posture and assist to weight shift.  Tending to stick buttock out and not stand upright   Stairs             Wheelchair Mobility    Modified Rankin (Stroke Patients Only)       Balance Overall balance assessment: Needs assistance Sitting-balance support: Bilateral upper extremity supported;Feet supported Sitting balance-Leahy Scale: Poor Sitting balance - Comments: Pt with tendency to lean posteriorly and L and requiring mod A initially.  Progressed to min guard or min A.  Prior to standing worked on leaning forward hands on chair , hands on knees, elbows on knees. Also worked on reaching forward outside BOS once in chair forcing pt to lean forward.  At least 10 mins spent sitting edge of bed or chair working on decreasing posterior lean   Standing balance support: During functional activity;Bilateral upper extremity supported Standing balance-Leahy Scale: Poor Standing balance comment: Requiring UE support and mod A x 2 - posterior lean; stood x 4 for 10-15 sec each.  Tending to push back  with R leg and stick out buttock.  Worked on R leg positioning prior to standing and support at buttock and shoulder for posture                            Cognition Arousal/Alertness: Awake/alert Behavior During Therapy: WFL for  tasks assessed/performed Overall Cognitive Status: Impaired/Different from baseline                   Orientation Level: Disoriented to;Time;Situation Current Attention Level: Sustained Memory: Decreased short-term memory;Decreased recall of precautions Following Commands: Follows one step commands with increased time Safety/Judgement: Decreased awareness of deficits;Decreased awareness of safety Awareness: Intellectual Problem Solving: Slow processing;Decreased initiation;Difficulty sequencing;Requires verbal cues;Requires tactile cues General Comments: Pt alert and pleasant.  She is demonstrating improved and meaningful communication: stating drawings on wall from her niece and nephew, after MD left asked "who was that?", stating " I want to get out of bed."  Still requiring max multimodal cues for initiation      Exercises      General Comments        Pertinent Vitals/Pain Pain Assessment: No/denies pain    Home Living                      Prior Function            PT Goals (current goals can now be found in the care plan section) Progress towards PT goals: Progressing toward goals    Frequency    Min 2X/week      PT Plan Current plan remains appropriate    Co-evaluation              AM-PAC PT "6 Clicks" Mobility   Outcome Measure  Help needed turning from your back to your side while in a flat bed without using bedrails?: A Lot Help needed moving from lying on your back to sitting on the side of a flat bed without using bedrails?: A Lot Help needed moving to and from a bed to a chair (including a wheelchair)?: Total Help needed standing up from a chair using your arms (e.g., wheelchair or bedside chair)?: Total Help needed to walk in hospital room?: Total Help needed climbing 3-5 steps with a railing? : Total 6 Click Score: 8    End of Session Equipment Utilized During Treatment: Gait belt Activity Tolerance: Patient tolerated  treatment well Patient left: with chair alarm set;in chair;with call bell/phone within reach Nurse Communication: Mobility status;Need for lift equipment [place] Dec STEDY vs maxi move back to bed; lunch ready and pt needs assist) PT Visit Diagnosis: Muscle weakness (generalized) (M62.81);Repeated falls (R29.6);Difficulty in walking, not elsewhere classified (R26.2);Unsteadiness on feet (R26.81)     Time: 1130-1200 PT Time Calculation (min) (ACUTE ONLY): 30 min  Charges:  $Therapeutic Activity: 8-22 mins $Neuromuscular Re-education: 8-22 mins                     Anise Salvo, PT Acute Rehab Services Pager [phone] [facility] [phone]    Rayetta Humphrey 04/17/2021, 12:15 PM

## 2021-04-17 NOTE — TOC Progression Note (Addendum)
Transition of Care [facility]) - Progression Note    Patient Details  Name: Lalah Durango MRN: [id number] Date of Birth: 1966-03-11  Transition of Care [facility] CM/SW Contact  Janae Bridgeman, RN Phone Number: 04/17/2021, 2:37 PM  Clinical Narrative:    CM called and spoke with Sarina Ill, CM at [facility] and she states that she is unable to accept the patient for admission at this time.  CM and MSW with DTP will continue to explore options for SNF placement for the patient.  The patient will need a LOT provided through [facility] leadership for placement.  Financial counseling made a note that the patient's Healthcare POA came by the financial counseling office to follow up with DSS regarding needed documents from the Medical Records.  I have no current pending bed offers for SNF placement at this time, but will continue to explore options for SNF placement for this patient.   Expected Discharge Plan: Hospice Medical Facility Barriers to Discharge: Inadequate or no insurance, Family Issues  Expected Discharge Plan and Services Expected Discharge Plan: Hospice Medical Facility In-house Referral: Clinical Social Work Discharge Planning Services: CM Consult Post Acute Care Choice: Skilled Nursing Facility Living arrangements for the past 2 months: Single Family Home Expected Discharge Date: 01/16/21               DME Arranged: [name] rolling DME Agency: [employer] Date DME Agency Contacted: 01/20/21 Time DME Agency Contacted: 1435 Representative spoke with at DME Agency: [name]             Social Determinants of Health (SDOH) Interventions    Readmission Risk Interventions Readmission Risk Prevention Plan 01/14/2021  Transportation Screening Complete  PCP or Specialist Appt within 3-5 Days (No Data)  Social Work Consult for Recovery Care Planning/Counseling Complete  Palliative Care Screening Not Applicable  Medication Review Oceanographer) Referral to Pharmacy   Some recent data might be hidden

## 2021-04-18 LAB — GLUCOSE, CAPILLARY
Glucose-Capillary: 120 mg/dL — ABNORMAL HIGH (ref 70–99)
Glucose-Capillary: 127 mg/dL — ABNORMAL HIGH (ref 70–99)
Glucose-Capillary: 135 mg/dL — ABNORMAL HIGH (ref 70–99)
Glucose-Capillary: 161 mg/dL — ABNORMAL HIGH (ref 70–99)
Glucose-Capillary: 178 mg/dL — ABNORMAL HIGH (ref 70–99)
Glucose-Capillary: 238 mg/dL — ABNORMAL HIGH (ref 70–99)
Glucose-Capillary: 361 mg/dL — ABNORMAL HIGH (ref 70–99)
Glucose-Capillary: 78 mg/dL (ref 70–99)

## 2021-04-18 NOTE — Progress Notes (Signed)
Occupational Therapy Treatment Patient Details Name: Carolyn Norton MRN: [id number] DOB: [birth year] Today's Date: 04/18/2021    History of present illness Pt is a 55 y.o. female initially admitted nearly 3 months ago on 01/11/21 with multiple falls, AMS, weakness. Workup for severe dehydration, DKA and metabolic encephalopathy. 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 7/1 underwent lumbar puncture and drained large volume of CSF to assess for potential improvement in symptomology (none noted);  7/2 MRI of the brain, cervical, thoracic and lumbar spine with no acute changes to explain LE weakness. PT signed off 7/12 secondary to lack of progress and further cognitive/functional decline. Cortrak placed 7/15 secondary to poor [email or web address]. Family declining comfort measures. On 8/2 patient with increased alertness per MD after beginning medication for anorexia and PT/OT reiniated. PMH includes DM2, COPD, CKD3, GERD, bipolar disorder, COVID-19, polysubstance abuse including cocaine.   OT comments  Patient met lying supine in bed asleep but easily aroused. Patient with blank stare up and to the R. Very few attempts to verbalize this a.m. likely secondary to lethargy as this therapist awoke patient upon entry. Increased coaxing/encouragement for OOB, EOB, then bed level activity but patient refused stating "Would you stop aggravating me?". Patient also refused self-feeding at bedlevel despite tray sitting beside bed untouched. Per PT  note yesterday patient eager to get out of bed, responding with 3-4 word sentences and requiring Mod A+2 for sit to stand and stand-pivot transfers to recliner with use of RW. OT to return later this date if time allows to maximize patient participation.    Follow Up Recommendations  Other (comment);SNF (Long-term/custodial care)    Equipment Recommendations  Other (comment) (TBD)    Recommendations for Other Services      Precautions /  Restrictions Precautions Precautions: Fall Precaution Comments: High fall risk Restrictions Weight Bearing Restrictions: No       Mobility Bed Mobility               General bed mobility comments: Patient refused.    Transfers                 General transfer comment: Patient refused.    Balance                                           ADL either performed or assessed with clinical judgement   ADL                                         General ADL Comments: Patient declining all bed level, EOB and OOB activity this a.m. despite coaxing/encouragement.     Vision       Perception     Praxis      Cognition Arousal/Alertness: Awake/alert;Lethargic Behavior During Therapy: WFL for tasks assessed/performed Overall Cognitive Status: Impaired/Different from baseline                   Orientation Level: Disoriented to;Time;Situation Current Attention Level: Sustained Memory: Decreased short-term memory;Decreased recall of precautions Following Commands: Follows one step commands with increased time Safety/Judgement: Decreased awareness of deficits;Decreased awareness of safety Awareness: Intellectual Problem Solving: Slow processing;Decreased initiation;Difficulty sequencing;Requires verbal cues;Requires tactile cues General Comments: Patient somewhat lethargic this a.m. States "would you stop aggravating me?" with  therapists attempts to engage patient in OT session. ?Seems to participate better in afternoon?        Exercises     Shoulder Instructions       General Comments      Pertinent Vitals/ Pain       Pain Assessment: No/denies pain  Home Living                                          Prior Functioning/Environment              Frequency  Min 1X/week        Progress Toward Goals  OT Goals(current goals can now be found in the care plan section)  Progress towards  OT goals: Not progressing toward goals - comment (Poor participation this a.m.)  Acute Rehab OT Goals Patient Stated Goal: None stated. OT Goal Formulation: Patient unable to participate in goal setting Time For Goal Achievement: 04/23/21 Potential to Achieve Goals: Fair ADL Goals Pt Will Perform Eating: with set-up;sitting Pt Will Perform Grooming: with set-up;sitting Pt Will Perform Upper Body Dressing: with min assist;sitting Pt Will Perform Lower Body Dressing: with mod assist;sit to/from stand Pt Will Transfer to Toilet: with mod assist;bedside commode Pt Will Perform Toileting - Clothing Manipulation and hygiene: with mod assist;sit to/from stand;sitting/lateral leans Pt/caregiver will Perform Home Exercise Program: Increased strength;Both right and left upper extremity;With theraband;With written HEP provided Additional ADL Goal #1: Patietn will follow 1-step verbal commands with 95% accuracy in prep for ADLs at bed level vs. in sitting. Additional ADL Goal #2: Patient will follow 1-step verbal commands with 50% accuracy in prep for ADLs. Additional ADL Goal #3: Patient will complete 3/3 grooming tasks in supported sitting position with Mod A and Max multimodal cues.  Plan Discharge plan remains appropriate;Frequency remains appropriate    Co-evaluation                 AM-PAC OT "6 Clicks" Daily Activity     Outcome Measure   Help from another person eating meals?: A Lot Help from another person taking care of personal grooming?: Total Help from another person toileting, which includes using toliet, bedpan, or urinal?: Total Help from another person bathing (including washing, rinsing, drying)?: Total Help from another person to put on and taking off regular upper body clothing?: Total Help from another person to put on and taking off regular lower body clothing?: Total 6 Click Score: 7    End of Session    OT Visit Diagnosis: Unsteadiness on feet (R26.81);Other  abnormalities of gait and mobility (R26.89);Muscle weakness (generalized) (M62.81);Other symptoms and signs involving cognitive function;Other symptoms and signs involving the nervous system (R29.898)   Activity Tolerance Other (comment) (Poor participation this a.m.)   Patient Left in bed;with call bell/phone within reach;with bed alarm set   Nurse Communication Mobility status;Other (comment) (Patient with poor participation this a.m.)        Time: 8676-1950 OT Time Calculation (min): 10 min  Charges: OT General Charges $OT Visit: 1 Visit OT Treatments $Therapeutic Activity: 8-22 mins  Romolo Sieling H. OTR/L Supplemental OT, Department of rehab services [phone]   Ralphael Southgate R H. 04/18/2021, 8:45 AM

## 2021-04-18 NOTE — Progress Notes (Signed)
CSW spoke with Sullivan Lone at [facility] who is agreeable review patient's referral again.  Edwin Dada, MSW, LCSW Transitions of Care  Clinical Social Worker II [phone]

## 2021-04-18 NOTE — Progress Notes (Signed)
Occupational Therapy Treatment Patient Details Name: Carolyn Norton MRN: [id number] DOB: [birth year] Today's Date: 04/18/2021    History of present illness Pt is a 55 y.o. female initially admitted nearly 3 months ago on 01/11/21 with multiple falls, AMS, weakness. Workup for severe dehydration, DKA and metabolic encephalopathy. 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 7/1 underwent lumbar puncture and drained large volume of CSF to assess for potential improvement in symptomology (none noted);  7/2 MRI of the brain, cervical, thoracic and lumbar spine with no acute changes to explain LE weakness. PT signed off 7/12 secondary to lack of progress and further cognitive/functional decline. Cortrak placed 7/15 secondary to poor [email or web address]. Family declining comfort measures. On 8/2 patient with increased alertness per MD after beginning medication for anorexia and PT/OT reiniated. PMH includes DM2, COPD, CKD3, GERD, bipolar disorder, COVID-19, polysubstance abuse including cocaine.   OT comments  Patient with great progress toward goals this afternoon K[place]cki is not a morning person). Upon this writers return, patient smiling, engaging and motivated to participate. Completed LB dressing at bedlevel with Mod A. Patient able to thread socks on feet in figure-4 position, thread BLE through mesh underwear and bridge in supine with assist to hike over hips. Patient also demonstrates ability to stand from slightly elevated EOB with BUE on stedy. Hand hygiene in perched position in [place] with 1-2 cues for sequencing. Patient continues to be limited by decreased cognition, decreased activity tolerance, decreased balance and generalized strength/debility and would benefit from continued acute OT services in prep for safe d/c to next level of care.    Follow Up Recommendations  Other (comment);SNF (Long-term/custodial care)    Equipment Recommendations  Other (comment) (TBD)     Recommendations for Other Services      Precautions / Restrictions Precautions Precautions: Fall Precaution Comments: High fall risk Restrictions Weight Bearing Restrictions: No       Mobility Bed Mobility Overal bed mobility: Needs Assistance Bed Mobility: Rolling Rolling: Mod assist Sidelying to sit: Mod assist;+2 for physical assistance       General bed mobility comments: Mod A for rolling to R and heavy Mod A for with HOB elevated to bring trunk upright.    Transfers Overall transfer level: Needs assistance Equipment used: Ambulation equipment used Transfers: Sit to/from Stand Sit to Stand: Min assist         General transfer comment: Min A for sit to stand in stedy x4 trials with cues for anterior weight shift.    Balance Overall balance assessment: Needs assistance Sitting-balance support: Bilateral upper extremity supported;Feet supported Sitting balance-Leahy Scale: Poor Sitting balance - Comments: Requires Mod A initially to maintain static sitting balance at EOB; progressed to Min guard with cues for anterior weight shift. Waxes/wanes. Postural control: Posterior lean Standing balance support: During functional activity;Bilateral upper extremity supported Standing balance-Leahy Scale: Poor Standing balance comment: Requires BUE support and +2 assist. Able to stand in stedy with Min A and tactile cues for anterior weight shift.                           ADL either performed or assessed with clinical judgement   ADL Overall ADL's : Needs assistance/impaired Eating/Feeding: Sitting;Supervision/ safety   Grooming: Minimal assistance;Sitting Grooming Details (indicate cue type and reason): In perched position in stedy at sink surface. Completes hand washing task with 1-2 cues for sequencing. Does not require external assist.  Lower Body Dressing: Moderate assistance;Bed level Lower Body Dressing Details (indicate cue type and  reason): Figure-4 position in long sitting; forward chaining with patient able to pull bilateral socks over heels. Patient then able to thread BLE through mesh panties and bridge in supine with assist to hike over hips at bed leveel.                     Vision       Perception     Praxis      Cognition Arousal/Alertness: Awake/alert Behavior During Therapy: WFL for tasks assessed/performed Overall Cognitive Status: Impaired/Different from baseline                   Orientation Level: Disoriented to;Time;Situation Current Attention Level: Sustained Memory: Decreased short-term memory;Decreased recall of precautions Following Commands: Follows one step commands with increased time Safety/Judgement: Decreased awareness of deficits;Decreased awareness of safety Awareness: Intellectual Problem Solving: Slow processing;Decreased initiation;Difficulty sequencing;Requires verbal cues;Requires tactile cues General Comments: Much more participatory this afternoon; follow 1-step verbal command with 75% accuracy; smiling/pleasant        Exercises     Shoulder Instructions       General Comments Patient with much better participation this afternoon. Will ensure all future OT treatment sessions are in the afternoon.    Pertinent Vitals/ Pain       Pain Assessment: Faces Faces Pain Scale: No hurt  Home Living                                          Prior Functioning/Environment              Frequency  Min 1X/week        Progress Toward Goals  OT Goals(current goals can now be found in the care plan section)  Progress towards OT goals: Progressing toward goals  Acute Rehab OT Goals Patient Stated Goal: None stated. OT Goal Formulation: Patient unable to participate in goal setting Time For Goal Achievement: 04/23/21 Potential to Achieve Goals: Fair ADL Goals Pt Will Perform Eating: with set-up;sitting Pt Will Perform Grooming: with  set-up;sitting Pt Will Perform Upper Body Dressing: with min assist;sitting Pt Will Perform Lower Body Dressing: with mod assist;sit to/from stand Pt Will Transfer to Toilet: with mod assist;bedside commode Pt Will Perform Toileting - Clothing Manipulation and hygiene: with mod assist;sit to/from stand;sitting/lateral leans Pt/caregiver will Perform Home Exercise Program: Increased strength;Both right and left upper extremity;With theraband;With written HEP provided Additional ADL Goal #1: Patietn will follow 1-step verbal commands with 95% accuracy in prep for ADLs at bed level vs. in sitting. Additional ADL Goal #2: Patient will follow 1-step verbal commands with 50% accuracy in prep for ADLs. Additional ADL Goal #3: Patient will complete 3/3 grooming tasks in supported sitting position with Mod A and Max multimodal cues.  Plan Discharge plan remains appropriate;Frequency remains appropriate    Co-evaluation                 AM-PAC OT "6 Clicks" Daily Activity     Outcome Measure   Help from another person eating meals?: A Little Help from another person taking care of personal grooming?: A Little Help from another person toileting, which includes using toliet, bedpan, or urinal?: Total Help from another person bathing (including washing, rinsing, drying)?: Total Help from another person to put on and taking off  regular upper body clothing?: A Lot Help from another person to put on and taking off regular lower body clothing?: A Lot 6 Click Score: 12    End of Session Equipment Utilized During Treatment: Gait belt  OT Visit Diagnosis: Unsteadiness on feet (R26.81);Other abnormalities of gait and mobility (R26.89);Muscle weakness (generalized) (M62.81);Other symptoms and signs involving cognitive function;Other symptoms and signs involving the nervous system (R29.898)   Activity Tolerance Other (comment) (Poor participation this a.m.)   Patient Left in bed;with call bell/phone  within reach;with bed alarm set   Nurse Communication Mobility status;Other (comment) (Patient with poor participation this a.m.)        Time: 4742-5956 OT Time Calculation (min): 26 min  Charges: OT General Charges $OT Visit: 1 Visit OT Treatments $Self Care/Home Management : 8-22 mins $Therapeutic Activity: 8-22 mins  Jayden Rudge H. OTR/L Supplemental OT, Department of rehab services [phone]   Dontea Corlew R H. 04/18/2021, 1:13 PM

## 2021-04-18 NOTE — Progress Notes (Addendum)
PROGRESS NOTE  Carolyn Norton KDX:[id number] DOB: 1966/02/25 DOA: 01/11/2021 PCP: [name], FNP   LOS: 96 days   Brief narrative:  55 year old female past medical history of diabetes mellitus type 2, bipolar disorder type I, previous COVID infection, polysubstance abuse that includes cocaine.  She presented from home where she lives with her parents.  Her presenting symptoms included generalized weakness, multiple falls and confusion.  Family confirmed that she had been having the symptoms for at least a month with last use of crack cocaine 1 month prior to presentation.  Ever since that time she had not been doing well.  She would also be having issues with hyperglycemia.  In the ER she was diagnosed with severe dehydration, hyperglycemia and metabolic encephalopathy.   Since admission patient has been evaluated by the psychiatric team who has deemed her alert and oriented and having capacity to make decisions.  Patient is agreeable to placement at a rehab.  Currently she has severe physical deconditioning which was POA and PT is recommending SNF.  Because of her history of polysubstance abuse been difficult to obtain an SNF bed.  Initially her family was agreeable to taking her back in home when she is able to ambulate well enough to get back and forth for meals into the bathroom.  Unfortunately her cognitive and physical status has progressively declined.  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  Palliative medicine assisted but have signed off. Patient's previous advanced directives stated she did not want a PEG tube if she were unable to eat. Unfortunately no improvement with short term enteral feeds. Cortrak dc'd. Famliy not ready for comfort measures.    Beginning on 8/2, patient had improved mentation.  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  History of longstanding drug use/history of chronic progressive neurological decline/long COVID syndrome.  EEG MRI unchanged from prior.  On Zyprexa.  Stable at this time.   Dysphagia/failure to thrive secondary to multifactorial neurological condition No improvement after Megace. No improvement after 10 days of cortrack tube feedings.  No PEG per patient recently documented wishes.   7/27 PMT added low-dose Zyprexa as appetite stimulant and noted w/ improvement in mentation   Acute hypoxemic respiratory failure with underlying COPD Currently stable on room air.  Saturating 98%.  No wheezing.   Diabetes mellitus 2 with hyperglycemia Prior to admission hemoglobin A1c greater than 11.  Continue long-acting insulin and mealtime insulin and sliding scale insulin.  History of bipolar 1 disorder/polysubstance abuse/cognitive impairment Continue to hold home escitalopram, Depakote and Neurontin secondary to persistent altered mentation and bipolar symptoms not present. Currently on Zyprexa 5 mg and will continue.   Elevated blood pressure/New dx HTN/murmur On low-dose Norvasc. 8/2 Echocardiogram this admission revealed no valvular abnormalities     Profound physical deconditioning/critical illness myopathy Physical therapy has seen the patient and recommended skilled  nursing facility.   Ventriculomegaly NPH has  been ruled out.   Dyslipidemia Statin currently on hold   Physical deconditioning/bilateral heel cord shortening Continue bilateral PRAFO boots on every 4 hours and off every 4 hours.  Physical therapy on board.   GERD Oral PPI as outpatient   Hypokalemia/hypomagnesemia Resolved.  No recent labs.,   E. Coli [name] UTI Has completed course of treatment.    DVT prophylaxis: enoxaparin (LOVENOX) injection 40 mg Start: 03/24/21 1000 SCD's Start: 02/14/21 0829   Code Status: DNR  Family Communication: None  Status is: Inpatient  Remains inpatient appropriate because: Disposition issues  Dispo:  Patient From: Home  Planned Disposition: Skilled nursing facility as per physical therapy recommendation.  Medically stable for discharge: Yes  Barriers to discharge: Medicaid still pending.  History of substance abuse and bipolar disorder.  Consultants: Psychiatry Neurology Neurosurgery  Procedures: None  Anti-infectives:  None  Anti-infectives (From admission, onward)    Start     Dose/Rate Route Frequency Ordered Stop   03/06/21 0900  ceFAZolin (ANCEF) IVPB 1 g/50 mL premix        1 g 100 mL/hr over 30 Minutes Intravenous Every 8 hours 03/06/21 0801 03/10/21 0559   03/04/21 1430  cefTRIAXone (ROCEPHIN) 1 g in sodium chloride 0.9 % 100 mL IVPB  Status:  Discontinued        1 g 200 mL/hr over 30 Minutes Intravenous Every 24 hours 03/04/21 1335 03/06/21 0800   02/14/21 0828  vancomycin (VANCOREADY) IVPB 1000 mg/200 mL  Status:  Discontinued        1,000 mg 200 mL/hr over 60 Minutes Intravenous 60 min pre-op 02/14/21 0828 03/02/21 1139   02/13/21 2115  cefTRIAXone (ROCEPHIN) 1 g in sodium chloride 0.9 % 100 mL IVPB  Status:  Discontinued        1 g 200 mL/hr over 30 Minutes Intravenous Every 24 hours 02/13/21 2016 02/16/21 0844      Subjective: Today, patient was seen and examined at bedside.  Denies interval complaints.  Denies any pain,  nausea.  Objective: Vitals:   04/17/21 1902 04/17/21 2138  BP:  126/87  Pulse:  88  Resp:  17  Temp:  97.8 F (36.6 C)  SpO2: 100% 98%    Intake/Output Summary (Last 24 hours) at 04/18/2021 0905 Last data filed at 04/18/2021 0857 Gross per 24 hour  Intake 1010 ml  Output 400 ml  Net 610 ml    Filed Weights   04/03/21 0500 04/08/21 0413 04/13/21 0426  Weight: 68.5 kg 69.8 kg 69.2 kg   Body mass index is 24.62 kg/m.   Physical Exam: General:  Average built, not in obvious distress, alert awake, lying in bed. HENT:   No scleral pallor or icterus noted. Oral mucosa is moist.  Chest:  Clear breath sounds.  Diminished breath sounds bilaterally. No crackles or wheezes.  CVS: S1 &S2 heard. No murmur.  Regular rate and rhythm. Abdomen: Soft, nontender, nondistended.  Bowel sounds are heard.   Extremities: No cyanosis, clubbing or edema.  Peripheral pulses are palpable. Psych: Alert, awake and communicative, CNS:  No cranial nerve deficits.  Moves extremities, communicative Skin: Warm and dry.  No rashes noted.  Data Review: I have personally reviewed the following laboratory data and studies,  CBC: Recent Labs  Lab 04/12/21 0217  WBC 10.5  NEUTROABS 4.8  HGB 12.8  HCT 38.6  MCV 90.0  PLT 434*    Basic Metabolic Panel: Recent Labs  Lab  04/12/21 0217  NA 139  K 3.8  CL 103  CO2 28  GLUCOSE 156*  BUN 24*  CREATININE 0.91  CALCIUM 9.9    Liver Function Tests: Recent Labs  Lab 04/12/21 0217  AST 10*  ALT 10  ALKPHOS 65  BILITOT 0.5  PROT 6.4*  ALBUMIN 3.0*    No results for input(s): LIPASE, AMYLASE in the last 168 hours. No results for input(s): AMMONIA in the last 168 hours. Cardiac Enzymes: No results for input(s): CKTOTAL, CKMB, CKMBINDEX, TROPONINI in the last 168 hours. BNP (last 3 results) Recent Labs    03/24/21 0856 03/25/21 0203 03/26/21 0222  BNP 29.2 33.9 37.9     ProBNP (last 3 results) No results for input(s): PROBNP in the  last 8760 hours.  CBG: Recent Labs  Lab 04/17/21 1623 04/17/21 2028 04/18/21 0055 04/18/21 0412 04/18/21 0816  GLUCAP 250* 89 135* 78 238*    No results found for this or any previous visit (from the past 240 hour(s)).   Studies: No results found.    Joycelyn Das, MD  [facility] 04/18/2021  If 7PM-7AM, please contact night-coverage

## 2021-04-19 LAB — GLUCOSE, CAPILLARY
Glucose-Capillary: 73 mg/dL (ref 70–99)
Glucose-Capillary: 196 mg/dL — ABNORMAL HIGH (ref 70–99)
Glucose-Capillary: 208 mg/dL — ABNORMAL HIGH (ref 70–99)
Glucose-Capillary: 175 mg/dL — ABNORMAL HIGH (ref 70–99)
Glucose-Capillary: 316 mg/dL — ABNORMAL HIGH (ref 70–99)
Glucose-Capillary: 261 mg/dL — ABNORMAL HIGH (ref 70–99)

## 2021-04-20 LAB — GLUCOSE, CAPILLARY
Glucose-Capillary: 128 mg/dL — ABNORMAL HIGH (ref 70–99)
Glucose-Capillary: 334 mg/dL — ABNORMAL HIGH (ref 70–99)
Glucose-Capillary: 190 mg/dL — ABNORMAL HIGH (ref 70–99)
Glucose-Capillary: 270 mg/dL — ABNORMAL HIGH (ref 70–99)

## 2021-04-20 NOTE — Progress Notes (Signed)
PROGRESS NOTE  Carolyn Norton [id number]:[id number] DOB: Oct 16, 1965 DOA: 01/11/2021 PCP: [name], FNP   LOS: 98 days   Brief narrative:  55 year old female past medical history of diabetes mellitus type 2, bipolar disorder type I, previous COVID infection, polysubstance abuse that includes cocaine.  She presented from home where she lives with her parents.  Her presenting symptoms included generalized weakness, multiple falls and confusion.  Family confirmed that she had been having the symptoms for at least a month with last use of crack cocaine 1 month prior to presentation.  Ever since that time she had not been doing well.  She would also be having issues with hyperglycemia.  In the ER she was diagnosed with severe dehydration, hyperglycemia and metabolic encephalopathy.   Since admission patient has been evaluated by the psychiatric team who has deemed her alert and oriented and having capacity to make decisions.  Patient is agreeable to placement at a rehab.  Currently she has severe physical deconditioning which was POA and PT is recommending SNF.  Because of her history of polysubstance abuse been difficult to obtain an SNF bed.  Initially her family was agreeable to taking her back in home when she is able to ambulate well enough to get back and forth for meals into the bathroom.  Unfortunately her cognitive and physical status has progressively declined.  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  Palliative medicine assisted but have signed off. Patient's previous advanced directives stated she did not want a PEG tube if she were unable to eat. Unfortunately no improvement with short term enteral feeds. Cortrak dc'd. Famliy not ready for comfort measures.    Beginning on 8/2, patient had improved mentation.  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  History of longstanding drug use/history of chronic progressive neurological decline/long COVID syndrome.  EEG MRI unchanged from prior.  On Zyprexa.  Appears to be little more confused today.   Dysphagia/failure to thrive secondary to multifactorial neurological condition  On regular consistency diet   Acute hypoxemic respiratory failure with underlying COPD Currently stable on room air.  Saturating 98%.  No wheezing.   Diabetes mellitus 2 with hyperglycemia Prior to admission hemoglobin A1c greater than 11.  Continue long-acting insulin and mealtime insulin and sliding scale insulin.  Latest POC glucose of 128  History of bipolar 1 disorder/polysubstance abuse/cognitive impairment Continue to hold home escitalopram, Depakote and Neurontin secondary to persistent altered mentation and bipolar symptoms not present. Currently on Zyprexa 5 mg and will continue.   Essential hypertension On low-dose Norvasc. 8/2 Echocardiogram this admission revealed no valvular abnormalities     Profound physical deconditioning/critical illness myopathy Physical therapy has recommended skilled nursing facility placement.   Ventriculomegaly NPH has been ruled out.   Dyslipidemia Statin currently on hold   Physical deconditioning/bilateral heel cord shortening Continue bilateral PRAFO boots on  every 4 hours and off every  4 hours.  Physical therapy on board.   GERD Oral PPI as outpatient   Hypokalemia/hypomagnesemia Resolved.  No recent labs.,   E. Coli [name] UTI Has completed course of treatment.    DVT prophylaxis: enoxaparin (LOVENOX) injection 40 mg Start: 03/24/21 1000 SCD's Start: 02/14/21 0829   Code Status: DNR  Family Communication: None  Status is: Inpatient  Remains inpatient appropriate because: Disposition issues  Dispo:  Patient From: Home  Planned Disposition: Skilled nursing facility as per physical therapy recommendation.  Medically stable for discharge: Yes  Barriers to discharge: Medicaid still pending.  History of substance abuse and bipolar disorder.  Consultants: Psychiatry Neurology Neurosurgery  Procedures: None  Anti-infectives:  None  Anti-infectives (From admission, onward)    Start     Dose/Rate Route Frequency Ordered Stop   03/06/21 0900  ceFAZolin (ANCEF) IVPB 1 g/50 mL premix        1 g 100 mL/hr over 30 Minutes Intravenous Every 8 hours 03/06/21 0801 03/10/21 0559   03/04/21 1430  cefTRIAXone (ROCEPHIN) 1 g in sodium chloride 0.9 % 100 mL IVPB  Status:  Discontinued        1 g 200 mL/hr over 30 Minutes Intravenous Every 24 hours 03/04/21 1335 03/06/21 0800   02/14/21 0828  vancomycin (VANCOREADY) IVPB 1000 mg/200 mL  Status:  Discontinued        1,000 mg 200 mL/hr over 60 Minutes Intravenous 60 min pre-op 02/14/21 0828 03/02/21 1139   02/13/21 2115  cefTRIAXone (ROCEPHIN) 1 g in sodium chloride 0.9 % 100 mL IVPB  Status:  Discontinued        1 g 200 mL/hr over 30 Minutes Intravenous Every 24 hours 02/13/21 2016 02/16/21 0844      Subjective: Today, patient was seen and examined at bedside.  Denies interval complaints but appears to be little confused.  Objective: Vitals:   04/20/21 0300 04/20/21 0908  BP: (!) 118/58 127/78  Pulse: 92 85  Resp: 16   Temp: 98 F (36.7 C)   SpO2: 98% 100%    Intake/Output Summary (Last 24  hours) at 04/20/2021 1020 Last data filed at 04/20/2021 0530 Gross per 24 hour  Intake 540 ml  Output 1500 ml  Net -960 ml    Filed Weights   04/13/21 0426 04/19/21 0232 04/20/21 0500  Weight: 69.2 kg 68.5 kg 70.4 kg   Body mass index is 25.05 kg/m.   Physical Exam: General:  Average built, not in obvious distress, alert awake, lying in bed. HENT:   No scleral pallor or icterus noted. Oral mucosa is moist.  Chest:  Clear breath sounds.  Diminished breath sounds bilaterally. No crackles or wheezes.  CVS: S1 &S2 heard. No murmur.  Regular rate and rhythm. Abdomen: Soft, nontender, nondistended.  Bowel sounds are heard.   Extremities: No cyanosis, clubbing or edema.  Peripheral pulses are palpable. Psych: Alert, awake and communicative, confused at times CNS:  No cranial nerve deficits.  Moves extremities, communicative Skin: Warm and dry.  No rashes noted.  Data Review: I have personally reviewed the following laboratory data and studies,  CBC: No results for input(s): WBC, NEUTROABS, HGB, HCT, MCV, PLT in the last 168 hours.  Basic Metabolic Panel: No results for input(s): NA, K, CL, CO2, GLUCOSE, BUN, CREATININE, CALCIUM, MG, PHOS in the last 168 hours.  Liver Function Tests: No results for input(s): AST, ALT, ALKPHOS, BILITOT, PROT, ALBUMIN in the last 168 hours.  No results for input(s): LIPASE, AMYLASE in the last 168 hours. No results for input(s): AMMONIA in the last 168 hours. Cardiac Enzymes: No results for input(s): CKTOTAL, CKMB, CKMBINDEX, TROPONINI in the last 168 hours. BNP (last 3 results) Recent Labs    03/24/21 0856 03/25/21 0203 03/26/21 0222  BNP 29.2 33.9 37.9     ProBNP (last 3 results) No results for input(s): PROBNP in the last 8760 hours.  CBG: Recent Labs  Lab 04/19/21 0937 04/19/21 1129 04/19/21 1556 04/19/21 2020 04/20/21 0914  GLUCAP 208* 175* 316* 261* 128*    No results found for this or any previous visit (from the past 240  hour(s)).   Studies: No results found.    Joycelyn Das, MD  [facility] 04/20/2021  If 7PM-7AM, please contact night-coverage

## 2021-04-21 ENCOUNTER — Inpatient Hospital Stay (HOSPITAL_COMMUNITY): Payer: Medicaid Other

## 2021-04-21 LAB — COMPREHENSIVE METABOLIC PANEL
ALT: 12 U/L (ref 0–44)
AST: 14 U/L — ABNORMAL LOW (ref 15–41)
Albumin: 3 g/dL — ABNORMAL LOW (ref 3.5–5.0)
Alkaline Phosphatase: 64 U/L (ref 38–126)
Anion gap: 8 (ref 5–15)
BUN: 19 mg/dL (ref 6–20)
CO2: 28 mmol/L (ref 22–32)
Calcium: 9.5 mg/dL (ref 8.9–10.3)
Chloride: 100 mmol/L (ref 98–111)
Creatinine, Ser: 0.89 mg/dL (ref 0.44–1.00)
GFR, Estimated: >60 mL/min (ref >60)
Glucose, Bld: 227 mg/dL — ABNORMAL HIGH (ref 70–99)
Potassium: 3.9 mmol/L (ref 3.5–5.1)
Sodium: 136 mmol/L (ref 135–145)
Total Bilirubin: 0.3 mg/dL (ref 0.3–1.2)
Total Protein: 6.4 g/dL — ABNORMAL LOW (ref 6.5–8.1)

## 2021-04-21 LAB — BLOOD GAS, ARTERIAL
Acid-Base Excess: 1.5 mmol/L (ref 0.0–2.0)
Bicarbonate: 25.2 mmol/L (ref 20.0–28.0)
Drawn by: 244901
FIO2: 21
O2 Saturation: 95.9 %
Patient temperature: 36.8
pCO2 arterial: 36.8 mmHg (ref 32.0–48.0)
pH, Arterial: 7.449 (ref 7.350–7.450)
pO2, Arterial: 77.8 mmHg — ABNORMAL LOW (ref 83.0–108.0)

## 2021-04-21 LAB — CBC WITH DIFFERENTIAL/PLATELET
Abs Immature Granulocytes: 0.02 10*3/uL (ref 0.00–0.07)
Basophils Absolute: 0.1 10*3/uL (ref 0.0–0.1)
Basophils Relative: 1 %
Eosinophils Absolute: 0.3 10*3/uL (ref 0.0–0.5)
Eosinophils Relative: 3 %
HCT: 40.5 % (ref 36.0–46.0)
Hemoglobin: 13.5 g/dL (ref 12.0–15.0)
Immature Granulocytes: 0 %
Lymphocytes Relative: 37 %
Lymphs Abs: 3.5 10*3/uL (ref 0.7–4.0)
MCH: 29.8 pg (ref 26.0–34.0)
MCHC: 33.3 g/dL (ref 30.0–36.0)
MCV: 89.4 fL (ref 80.0–100.0)
Monocytes Absolute: 0.9 10*3/uL (ref 0.1–1.0)
Monocytes Relative: 9 %
Neutro Abs: 4.8 10*3/uL (ref 1.7–7.7)
Neutrophils Relative %: 50 %
Platelets: 352 10*3/uL (ref 150–400)
RBC: 4.53 MIL/uL (ref 3.87–5.11)
RDW: 12.8 % (ref 11.5–15.5)
WBC: 9.5 10*3/uL (ref 4.0–10.5)
nRBC: 0 % (ref 0.0–0.2)

## 2021-04-21 LAB — D-DIMER, QUANTITATIVE: D-Dimer, Quant: 0.38 ug/mL-FEU (ref 0.00–0.50)

## 2021-04-21 LAB — TROPONIN I (HIGH SENSITIVITY)
Troponin I (High Sensitivity): 4 ng/L (ref <18)
Troponin I (High Sensitivity): 4 ng/L (ref <18)

## 2021-04-21 LAB — GLUCOSE, CAPILLARY
Glucose-Capillary: 93 mg/dL (ref 70–99)
Glucose-Capillary: 152 mg/dL — ABNORMAL HIGH (ref 70–99)
Glucose-Capillary: 220 mg/dL — ABNORMAL HIGH (ref 70–99)
Glucose-Capillary: 286 mg/dL — ABNORMAL HIGH (ref 70–99)
Glucose-Capillary: 191 mg/dL — ABNORMAL HIGH (ref 70–99)

## 2021-04-21 MED ORDER — METHYLPREDNISOLONE SODIUM SUCC 125 MG IJ SOLR: 120.0000 mg | Freq: Two times a day (BID) | INTRAMUSCULAR | Status: DC

## 2021-04-21 MED ORDER — MAGNESIUM HYDROXIDE 400 MG/5ML PO SUSP: 30.0000 mL | Freq: Once | ORAL | Status: DC

## 2021-04-21 MED ORDER — IPRATROPIUM-ALBUTEROL 0.5-2.5 (3) MG/3ML IN SOLN
3.0000 mL | Freq: Four times a day (QID) | RESPIRATORY_TRACT | Status: DC
  Administered 2021-04-21: 3 mL via RESPIRATORY_TRACT
  Filled 2021-04-21: qty 3

## 2021-04-21 MED ORDER — METHYLPREDNISOLONE SODIUM SUCC 125 MG IJ SOLR
125.0000 mg | Freq: Once | INTRAMUSCULAR | Status: AC
  Administered 2021-04-21: 125 mg via INTRAVENOUS
  Filled 2021-04-21: qty 2

## 2021-04-21 MED ORDER — DOCUSATE SODIUM 100 MG PO CAPS
100.0000 mg | ORAL_CAPSULE | Freq: Two times a day (BID) | ORAL | Status: DC
  Administered 2021-04-21 – 2021-04-30 (×12): 100 mg via ORAL
  Filled 2021-04-21 (×14): qty 1

## 2021-04-21 MED ORDER — IPRATROPIUM-ALBUTEROL 0.5-2.5 (3) MG/3ML IN SOLN
3.0000 mL | Freq: Once | RESPIRATORY_TRACT | Status: AC
  Administered 2021-04-21: 3 mL via RESPIRATORY_TRACT
  Filled 2021-04-21: qty 3

## 2021-04-21 MED ORDER — INSULIN ASPART 100 UNIT/ML IJ SOLN
3.0000 [IU] | Freq: Three times a day (TID) | INTRAMUSCULAR | Status: DC
  Administered 2021-04-21 – 2021-04-26 (×16): 3 [IU] via SUBCUTANEOUS

## 2021-04-21 MED ORDER — POLYETHYLENE GLYCOL 3350 17 G PO PACK
17.0000 g | PACK | Freq: Every day | ORAL | Status: DC
  Administered 2021-04-22 – 2021-04-30 (×9): 17 g via ORAL
  Filled 2021-04-21 (×10): qty 1

## 2021-04-21 MED ORDER — INSULIN ASPART 100 UNIT/ML IJ SOLN
0.0000 [IU] | Freq: Three times a day (TID) | INTRAMUSCULAR | Status: DC
  Administered 2021-04-21: 5 [IU] via SUBCUTANEOUS
  Administered 2021-04-22: 9 [IU] via SUBCUTANEOUS

## 2021-04-21 MED ORDER — SENNA 8.6 MG PO TABS
1.0000 | ORAL_TABLET | Freq: Two times a day (BID) | ORAL | Status: DC
  Administered 2021-04-21 – 2021-04-30 (×18): 8.6 mg via ORAL
  Filled 2021-04-21 (×18): qty 1

## 2021-04-21 NOTE — Progress Notes (Signed)
Inpatient Diabetes Program Recommendations  AACE/ADA: New Consensus Statement on Inpatient Glycemic Control (2015)  Target Ranges:  Prepandial:   less than 140 mg/dL      Peak postprandial:   less than 180 mg/dL (1-2 hours)      Critically ill patients:  140 - 180 mg/dL   Lab Results  Component Value Date   GLUCAP 152 (H) 04/21/2021   HGBA1C 8.8 (H) 03/20/2021    Review of Glycemic Control Results for EMARIE, PAUL (MRN [id number]) as of 04/21/2021 10:49  Ref. Range 04/20/2021 09:14 04/20/2021 11:45 04/20/2021 16:13 04/20/2021 22:57 04/21/2021 06:30 04/21/2021 07:54  Glucose-Capillary Latest Ref Range: 70 - 99 mg/dL 383 (H) Novolog 3 units 334 (H) Novolog 5 units 190 (H) Novolog 4 units 270 (H) Novolog 5 units 93 152 (H)   Diabetes history: DM2 Current orders for Inpatient glycemic control: Semglee 18 units hs, 8 units hs, Novolog 2 units tid meal coverage, Novolog 0-9 units q 4 hrs.  Inpatient Diabetes Program Recommendations:   -Decrease Novolog correction to tid 0-9 units + 0-5 units hs since patient is now eating -Postprandial CBGs elevated, increase Novolog meal coverage to 3 units tid if eats 50% Secure chat sent to Dr. Tyson Babinski.  Thank you, Billy Fischer. Lorain Fettes, RN, MSN, CDE  Diabetes Coordinator Inpatient Glycemic Control Team Team Pager [phone] (8am-5pm) 04/21/2021 10:55 AM

## 2021-04-21 NOTE — Plan of Care (Signed)

## 2021-04-21 NOTE — Progress Notes (Signed)
[facility] PROGRESS NOTE  Carolyn Norton IEP:[id number] DOB: [birth year] DOA: 01/11/2021 PCP: [name], FNP            March 27, 2021     Status: Remains inpatient appropriate because:Unsafe d/c plan-due to patient's underlying poor mental status short-term memory deficits she will require 24/7 care after discharge  Dispo:  Patient From:  Home  Planned Disposition: SNF w/ Hospice follow up at facility  Medically stable for discharge:  yes  Barriers to DC: Medicaid still pending; prior history of substance abuse and underlying documented bipolar disorder             Difficult to place: Yes  Level of care: Med-Surg  Code Status: DNR Family Communication: Father 8/03.  Updated on patient's improved mental status and suspicions that the low-dose Zyprexa that was initiated for anorexia has actually improved her mental status.  Explained that we are not certain what is the cause of her changes but clearly this medication appears to be improving her overall alertness. DVT prophylaxis: Lovenox COVID vaccination status: Unknown-post COVID infection 09/06/20   HPI: 55 year old female past medical history of diabetes mellitus type 2, bipolar disorder type I, previous COVID infection, polysubstance abuse that includes cocaine.  She presented from home where she lives with her parents.  Her presenting symptoms included generalized weakness, multiple falls and confusion.  Family confirmed that she had been having the symptoms for at least a month with last use of crack cocaine 1 month prior to presentation.  Ever since that time she had not been doing well.  She would also be having issues with hyperglycemia.  In the ER she was diagnosed with severe dehydration, hyperglycemia and metabolic encephalopathy.  Since admission patient has been evaluated by the psychiatric team who has deemed her alert and oriented and having capacity to make decisions.  Patient is agreeable to  placement at a rehab.  Currently she has severe physical deconditioning which was POA and PT is recommending SNF.  Because of her history of polysubstance abuse been difficult to obtain an SNF bed.  Initially her family was agreeable to taking her back in home when she is able to ambulate well enough to get back and forth for meals into the bathroom.  Unfortunately her cognitive and physical status has progressively declined.  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  Palliative medicine assisted but have signed off. Patient's previous advanced directives stated she did not want a PEG tube if she were unable to eat. Unfortunately no improvement with short term enteral feeds. Cortrak dc'd. Famliy not ready for comfort measures.   Beginning on 8/2 patient had improved mentation.  Likely 2/2 recent addition of Zyprexa  Subjective: Awakened and appears to be much more drowsy today.  Not following simple commands.  Objective: Vitals:   04/21/21 0628 04/21/21 0927  BP: (!) 141/84 121/71  Pulse: 92 78  Resp: 14   Temp: 98.2 F (36.8 C)   SpO2: 100% 94%    Intake/Output Summary (Last 24 hours) at 04/21/2021 1209 Last data filed at 04/21/2021 0630 Gross per 24 hour  Intake 120 ml  Output 950 ml  Net -830 ml    Filed Weights   04/19/21 0232 04/20/21 0500 04/21/21 0600  Weight: 68.5 kg 70.4 kg 70 kg    Exam: Constitutional: Awakened, no acute distress Respiratory: Lungs are clear to auscultation, no increased work of breathing at  rest, stable on room air Abdomen: LBM 8/6, nontender with normoactive bowel sounds.  Oral intake remains marginal Neurologic: Cranial nerves are grossly intact.  More drowsy today but patient has awareness of that.  No focal neurological deficits.  Able to squeeze hands briskly and equally when asked although strength still 3/5 Psychiatric: Alert and oriented times name and place  still not oriented to year.  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   Thorough evaluation revealed symptoms not related to NPH MRI of her spine without any abnormalities to explain her progressive lower extremity weakness. Neurology suspects chronic progressive neurological decline 2/2 longstanding drug abuse. 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  Repeat EEG and MRI unchanged from prior studies Improvement in mentation or addition of Zyprexa  Dysphagia/failure to thrive secondary to multifactorial neurological condition No improvement after Megace. No improvement after 10 days of cortrack tube feedings.  No PEG per patient recently documented wishes.   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  She was able to sit up in a chair for 15 minutes was able to progress to minimal guard minimum assist level.  She was also able to stand and pivot to the chair with moderate assist x2   Acute hypoxemic respiratory failure with underlying COPD Stable on room air  Diabetes mellitus 2 with hyperglycemia on insulin Preadmission hemoglobin A1c greater than 11 CBGs improved after adjustments in long-acting insulin.   Semglee 18 units a.m. and 8 units p.m. Continue 3 units  meal coverage Continue SSI.  History of bipolar 1 disorder/polysubstance abuse/cognitive impairment Continue to hold home escitalopram, Depakote and Neurontin secondary to persistent altered mentation and bipolar symptoms not present Given improvement in mental status will continue Zyprexa 5 mg to treat her bipolar disorder.   Elevated blood pressure/New dx HTN/murmur No prior documented history of hypertension  Continue low-dose Norvasc 8/2 Echocardiogram this admission revealed no valvular abnormalities but was c/w diastolic dysfunction; murmur has resolved was likely 2/2 hyperdynamic state  Suspected constipation Given 1 dose of milk of magnesia w/ appropriate results  Ventriculomegaly/? NPH NPH ruled out as above and is likely secondary to atrophy and generalized volume loss  Dyslipidemia Hold statin until oral intake improves  Physical deconditioning/bilateral heel cord shortening Continue bilateral PRAFO boots on every 4 hours and off every 4 hours Given progressive neurological decline unable to participate with therapies    Other problems: GERD Oral PPI on hold while n.p.o.  Hypokalemia/hypomagnesemia Resolved  E. Coli [name] UTI Treated Use purewick only at bedtime    Data Reviewed: Basic Metabolic Panel: No results for input(s): NA, K, CL, CO2, GLUCOSE, BUN, CREATININE, CALCIUM, MG, PHOS in the last 168 hours.  Liver Function Tests: No results for input(s): AST, ALT, ALKPHOS, BILITOT, PROT, ALBUMIN in the last 168 hours.    No results for input(s): LIPASE, AMYLASE in the last 168 hours. No results for input(s): AMMONIA in the last 168  hours. CBC: No results for input(s): WBC, NEUTROABS, HGB, HCT, MCV, PLT in the last 168 hours.   Cardiac Enzymes: No results for input(s): CKTOTAL, CKMB, CKMBINDEX, TROPONINI in the last 168 hours. BNP (last 3 results) Recent Labs    03/24/21 0856 03/25/21 0203 03/26/21 0222  BNP 29.2 33.9 37.9    ProBNP  (last 3 results) No results for input(s): PROBNP in the last 8760 hours.  CBG: Recent Labs  Lab 04/20/21 1145 04/20/21 1613 04/20/21 2257 04/21/21 0630 04/21/21 0754  GLUCAP 334* 190* 270* 93 152*    No results found for this or any previous visit (from the past 240 hour(s)).     Studies: No results found.  Scheduled Meds:  (feeding supplement) PROSource Plus  30 mL Oral TID BM   amLODipine  5 mg Oral Daily   enoxaparin (LOVENOX) injection  40 mg Subcutaneous Q24H   feeding supplement (GLUCERNA SHAKE)  237 mL Oral TID BM   insulin aspart  0-9 Units Subcutaneous TID AC   insulin aspart  3 Units Subcutaneous TID WC   insulin glargine-yfgn  18 Units Subcutaneous Daily   And   insulin glargine-yfgn  8 Units Subcutaneous QHS   metoprolol tartrate  50 mg Oral BID   multivitamin  15 mL Oral Daily   OLANZapine zydis  5 mg Oral QHS   Continuous Infusions:      Principal Problem:   Bipolar 1 disorder, depressed, full remission (HCC) Active Problems:   Bipolar 1 disorder (HCC)   Hypertension   Generalized weakness   Controlled type 2 diabetes mellitus with hyperglycemia, with long-term current use of insulin (HCC)   Hyperlipidemia   NPH (normal pressure hydrocephalus) (HCC)   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   Protein-calorie malnutrition, severe   Consultants: Psychiatry Neurology Neurosurgery  Procedures: None  Antibiotics: Anti-infectives (From admission, onward)    Start     Dose/Rate Route Frequency Ordered Stop   03/06/21 0900  ceFAZolin (ANCEF) IVPB 1 g/50 mL premix        1 g 100 mL/hr over 30 Minutes Intravenous Every 8 hours 03/06/21 0801 03/10/21 0559   03/04/21 1430  cefTRIAXone (ROCEPHIN) 1 g in sodium chloride 0.9 % 100 mL IVPB  Status:  Discontinued        1 g 200 mL/hr over 30 Minutes Intravenous Every 24 hours 03/04/21 1335 03/06/21 0800   02/14/21 0828  vancomycin (VANCOREADY) IVPB 1000 mg/200 mL  Status:  Discontinued        1,000  mg 200 mL/hr over 60 Minutes Intravenous 60 min pre-op 02/14/21 0828 03/02/21 1139   02/13/21 2115  cefTRIAXone (ROCEPHIN) 1 g in sodium chloride 0.9 % 100 mL IVPB  Status:  Discontinued        1 g 200 mL/hr over 30 Minutes Intravenous Every 24 hours 02/13/21 2016 02/16/21 0844        Time spent: 15 minutes    [name] ANP  [facility] 7 am - 330 pm/M-F for direct patient care and secure chat Please refer to Amion for contact info 99  days

## 2021-04-21 NOTE — Progress Notes (Addendum)
CTSP regarding chest pressure and a sensation someone was sitting "on my chest".  Room air saturations 94 to 96% and nurse applied oxygen prior to my arrival.  Vital signs were otherwise stable.  Upon my entry into the room patient appeared confused and was trying to get out of bed.  Taken her oxygen off.  Lower side rail placed back up.  On exam she was noted to have expiratory wheezing that was faint.  She was nontender to palpation over her chest.  She was unable to tell me when the chest pain started but that the chest pain was constant and did not worsen or change with respiratory effort.  Her color is good and she does not appear to be in any acute distress.  At this point we will check stat portable chest x-ray, ABG, D-dimer, serial troponin levels, CBC and CMET.  Continue oxygen for now.  Check EKG.  Give 125 mg IV Solu-Medrol now followed by 60 mg IV every 6 hours.  We will also give stat DuoNeb treatment x1 and then continue the scheduled every 6 hours  1437: EKG reviewed and without any acute ischemic or infarction type changes.  1446: Chest x-ray unremarkable without any acute focal infiltrates or edema.  Labs remain pending.  Will sign out to attending physician

## 2021-04-22 LAB — GLUCOSE, CAPILLARY
Glucose-Capillary: 448 mg/dL — ABNORMAL HIGH (ref 70–99)
Glucose-Capillary: 536 mg/dL (ref 70–99)
Glucose-Capillary: 299 mg/dL — ABNORMAL HIGH (ref 70–99)
Glucose-Capillary: 344 mg/dL — ABNORMAL HIGH (ref 70–99)

## 2021-04-22 MED ORDER — IPRATROPIUM-ALBUTEROL 0.5-2.5 (3) MG/3ML IN SOLN: 3.0000 mL | Freq: Four times a day (QID) | RESPIRATORY_TRACT | Status: DC | PRN

## 2021-04-22 MED ORDER — INSULIN ASPART 100 UNIT/ML IJ SOLN
0.0000 [IU] | Freq: Every day | INTRAMUSCULAR | Status: DC
  Administered 2021-04-22: 4 [IU] via SUBCUTANEOUS
  Administered 2021-04-23: 2 [IU] via SUBCUTANEOUS
  Administered 2021-04-24 – 2021-04-27 (×2): 3 [IU] via SUBCUTANEOUS
  Administered 2021-04-28: 5 [IU] via SUBCUTANEOUS

## 2021-04-22 MED ORDER — PREDNISONE 10 MG (21) PO TBPK
10.0000 mg | ORAL_TABLET | Freq: Four times a day (QID) | ORAL | Status: AC
Start: 2021-04-24 — End: 2021-04-27
  Administered 2021-04-24 – 2021-04-27 (×10): 10 mg via ORAL

## 2021-04-22 MED ORDER — PREDNISONE 10 MG (21) PO TBPK
20.0000 mg | ORAL_TABLET | Freq: Every morning | ORAL | Status: AC
  Administered 2021-04-22: 20 mg via ORAL
  Filled 2021-04-22: qty 21

## 2021-04-22 MED ORDER — PREDNISONE 10 MG (21) PO TBPK
20.0000 mg | ORAL_TABLET | Freq: Every evening | ORAL | Status: AC
  Administered 2021-04-23: 20 mg via ORAL

## 2021-04-22 MED ORDER — PREDNISONE 10 MG (21) PO TBPK
10.0000 mg | ORAL_TABLET | ORAL | Status: AC
  Administered 2021-04-22: 10 mg via ORAL

## 2021-04-22 MED ORDER — INSULIN ASPART 100 UNIT/ML IJ SOLN
0.0000 [IU] | Freq: Three times a day (TID) | INTRAMUSCULAR | Status: DC
  Administered 2021-04-22: 20 [IU] via SUBCUTANEOUS
  Administered 2021-04-22: 11 [IU] via SUBCUTANEOUS
  Administered 2021-04-23: 3 [IU] via SUBCUTANEOUS
  Administered 2021-04-23 (×2): 20 [IU] via SUBCUTANEOUS
  Administered 2021-04-24: 11 [IU] via SUBCUTANEOUS
  Administered 2021-04-24: 20 [IU] via SUBCUTANEOUS
  Administered 2021-04-25: 4 [IU] via SUBCUTANEOUS
  Administered 2021-04-25: 15 [IU] via SUBCUTANEOUS
  Administered 2021-04-25: 11 [IU] via SUBCUTANEOUS
  Administered 2021-04-26: 3 [IU] via SUBCUTANEOUS
  Administered 2021-04-26: 20 [IU] via SUBCUTANEOUS
  Administered 2021-04-26: 11 [IU] via SUBCUTANEOUS
  Administered 2021-04-27: 7 [IU] via SUBCUTANEOUS
  Administered 2021-04-27 (×2): 4 [IU] via SUBCUTANEOUS
  Administered 2021-04-28: 20 [IU] via SUBCUTANEOUS
  Administered 2021-04-29: 15 [IU] via SUBCUTANEOUS

## 2021-04-22 MED ORDER — INSULIN GLARGINE-YFGN 100 UNIT/ML ~~LOC~~ SOLN
22.0000 [IU] | Freq: Every day | SUBCUTANEOUS | Status: DC
  Administered 2021-04-23: 22 [IU] via SUBCUTANEOUS
  Filled 2021-04-22: qty 0.22

## 2021-04-22 MED ORDER — PREDNISONE 10 MG (21) PO TBPK
10.0000 mg | ORAL_TABLET | Freq: Three times a day (TID) | ORAL | Status: AC
  Administered 2021-04-23 (×3): 10 mg via ORAL

## 2021-04-22 MED ORDER — INSULIN GLARGINE-YFGN 100 UNIT/ML ~~LOC~~ SOLN
12.0000 [IU] | Freq: Every day | SUBCUTANEOUS | Status: DC
  Administered 2021-04-22: 12 [IU] via SUBCUTANEOUS
  Filled 2021-04-22 (×2): qty 0.12

## 2021-04-22 MED ORDER — PREDNISONE 10 MG (21) PO TBPK
20.0000 mg | ORAL_TABLET | Freq: Every evening | ORAL | Status: AC
  Administered 2021-04-22: 20 mg via ORAL

## 2021-04-22 NOTE — Progress Notes (Signed)
Physical Therapy Treatment Patient Details Name: Carolyn Norton MRN: [id number] DOB: 1966-06-28 Today's Date: 04/22/2021    History of Present Illness Pt is a 54 y.o. female initially admitted nearly 3 months ago on 01/11/21 with multiple falls, AMS, weakness. Workup for severe dehydration, DKA and metabolic encephalopathy. 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 7/1 underwent lumbar puncture and drained large volume of CSF to assess for potential improvement in symptomology (none noted);  7/2 MRI of the brain, cervical, thoracic and lumbar spine with no acute changes to explain LE weakness. PT signed off 7/12 secondary to lack of progress and further cognitive/functional decline. Cortrak placed 7/15 secondary to poor [email or web address]. Family declining comfort measures. On 8/2 patient with increased alertness per MD after beginning medication for anorexia and PT/OT reiniated. PMH includes DM2, COPD, CKD3, GERD, bipolar disorder, COVID-19, polysubstance abuse including cocaine.    PT Comments    Pt admitted with above diagnosis. Pt was able to participate well with PT/OT today with pt able to stand multiple times and work on sitting balance. Pt following commands better today as well.  Overall pt with improvments in function/cognition today.   Pt currently with functional limitations due to balance and endurance deficits. Pt will benefit from skilled PT to increase their independence and safety with mobility to allow discharge to the venue listed below.      Follow Up Recommendations  SNF     Equipment Recommendations  Hospital bed;Wheelchair (measurements PT);Other (comment);Wheelchair cushion (measurements PT) Water quality scientist)    Recommendations for Other Services OT consult     Precautions / Restrictions Precautions Precautions: Fall Precaution Comments: High fall risk Restrictions Weight Bearing Restrictions: No    Mobility  Bed Mobility Overal bed mobility: Needs  Assistance Bed Mobility: Rolling Rolling: Min assist Sidelying to sit: Mod assist;Min assist;HOB elevated       General bed mobility comments: Pt was with OT on arrival.  Pt was placing mesh panties on with some assist to thread foot in.  Pt bridged at least 6-8 times to pull up panties and to practice bridging as well.  Pt min A for rolling to L and min A to mod assist with HOB elevated to bring trunk upright with pt pulling up slightly on PT's hand with one hand andn pushing up on rail with the other hand.    Transfers Overall transfer level: Needs assistance Equipment used: Rolling walker (2 wheeled) Transfers: Sit to/from Stand Sit to Stand: Mod assist;Max assist;+2 physical assistance;From elevated surface   Squat pivot transfers: Max assist;+2 physical assistance;Mod assist     General transfer comment: Pt following cues to reach down shins x 8 times while on EOB to encourage pt to lean forward and to try and get pt to improve sitting balance EOB. Pt would obtain upright sitting for up to 10 seconds but would slowly lean left and posterior and need cues to lean anteriorly again with pt lacking awareness that she was leaning or at least not making attempt to correct. Mod to max A for sit to stand to RW with cues for anterior weight shift by asking pt to bring her nose forward as well a lean forward. Pt initiates movement to lean forward as well as had proper hand placement however once pt initiates leaning forward, she quickly leans posteriorly on her heels and even w ith max cues (different directions) and assist, cannot bring her hips forward enough to not need mod to max assist for standing  with significant posterior lean.  STood x 3 for about 30 seconds each with pt c/o fatigue.  Pt squat pivot at end of session to chair with pt again leaning forward well but once she began the pivot, her LEs extend and weight is on heels with PT and OT having to give incr assist to assist pt to  chair.  Ambulation/Gait                 Stairs             Wheelchair Mobility    Modified Rankin (Stroke Patients Only)       Balance Overall balance assessment: Needs assistance Sitting-balance support: Bilateral upper extremity supported;Feet supported Sitting balance-Leahy Scale: Poor Sitting balance - Comments: Requires Mod A initially to maintain static sitting balance at EOB; progressed to Min guard with cues for anterior weight shift. Waxes/wanes. Postural control: Posterior lean Standing balance support: During functional activity;Bilateral upper extremity supported Standing balance-Leahy Scale: Poor Standing balance comment: Requires BUE support and +2 assist. Able to stand with RW with Mod to max A and tactile cues for anterior weight shift.                            Cognition Arousal/Alertness: Awake/alert Behavior During Therapy: WFL for tasks assessed/performed Overall Cognitive Status: Impaired/Different from baseline Area of Impairment: Attention;Following commands;Problem solving                 Orientation Level: Disoriented to;Time;Situation Current Attention Level: Sustained Memory: Decreased short-term memory;Decreased recall of precautions Following Commands: Follows one step commands with increased time Safety/Judgement: Decreased awareness of deficits;Decreased awareness of safety Awareness: Intellectual Problem Solving: Slow processing;Decreased initiation;Difficulty sequencing;Requires verbal cues;Requires tactile cues General Comments: Much more participatory this afternoon; following 1-step verbal command with 90% accuracy; smiling/pleasant      Exercises General Exercises - Lower Extremity Ankle Circles/Pumps: Both;5 reps;Supine;AAROM Long Arc Quad: AAROM;5 reps;Both;Supine Heel Slides: AAROM;Both;5 reps;Supine Other Exercises Other Exercises: Stretching to bil heel cords and hamstrings. Placed PRAFOS on at  end of session.    General Comments General comments (skin integrity, edema, etc.): Pt smiling and participating well.      Pertinent Vitals/Pain Pain Assessment: No/denies pain    Home Living                      Prior Function            PT Goals (current goals can now be found in the care plan section) Acute Rehab PT Goals Patient Stated Goal: None stated. Progress towards PT goals: Progressing toward goals    Frequency    Min 2X/week      PT Plan Current plan remains appropriate    Co-evaluation PT/OT/SLP Co-Evaluation/Treatment: Yes Reason for Co-Treatment: Complexity of the patient's impairments (multi-system involvement);For patient/therapist safety PT goals addressed during session: Mobility/safety with mobility        AM-PAC PT "6 Clicks" Mobility   Outcome Measure  Help needed turning from your back to your side while in a flat bed without using bedrails?: A Lot Help needed moving from lying on your back to sitting on the side of a flat bed without using bedrails?: A Lot Help needed moving to and from a bed to a chair (including a wheelchair)?: Total Help needed standing up from a chair using your arms (e.g., wheelchair or bedside chair)?: Total Help needed to walk in hospital room?:  Total Help needed climbing 3-5 steps with a railing? : Total 6 Click Score: 8    End of Session Equipment Utilized During Treatment: Gait belt Activity Tolerance: Patient tolerated treatment well Patient left: with chair alarm set;in chair;with call bell/phone within reach Nurse Communication: Mobility status;Need for lift equipment [place] Dec STEDY vs maxi move back to bed) PT Visit Diagnosis: Muscle weakness (generalized) (M62.81);Repeated falls (R29.6);Difficulty in walking, not elsewhere classified (R26.2);Unsteadiness on feet (R26.81)     Time: 2355-7322 PT Time Calculation (min) (ACUTE ONLY): 37 min  Charges:  $Therapeutic Exercise: 8-22 mins                      Elvenia Godden M,PT Acute Rehab Services [phone] (pager)    Bevelyn Buckles 04/22/2021, 2:11 PM

## 2021-04-22 NOTE — Progress Notes (Signed)
CSW spoke with patient's Carolyn Norton to determine if she had any updates regarding patient's Medicaid and Disability applications - Dewayne Hatch states all paperwork has been submitted and that she is still waiting on a decision to be made by DDS. CSW asked Dewayne Hatch to call CSW with any updates she receives and she is agreeable.  CSW spoke with Sullivan Lone at [facility] who states the patient's clinicals were approved for admissoin to the facility, however the corporate business office staff have to review her accounts and assets prior to to offering a bed.  CSW spoke with [facility] Director regarding patient.  Edwin Dada, MSW, LCSW Transitions of Care  Clinical Social Worker II [phone]

## 2021-04-22 NOTE — [place] FL2 (Signed)
[place] MEDICAID FL2 LEVEL OF CARE SCREENING TOOL     IDENTIFICATION  Patient Name: Carolyn Norton Birthdate: 01-Jul-1966 Sex: female Admission Date (Current Location): 01/11/2021  [facility] and [place] Number:  Producer, television/film/video and Address:  [facility] [place], [address], [place], [place] [address]      Provider Number: [id number]  Attending Physician Name and Address:  Joycelyn Das, MD  Relative Name and Phone Number:  Eiko Mcgowen [phone]    Current Level of Care: Hospital Recommended Level of Care: Skilled Nursing Facility Prior Approval Number:    Date Approved/Denied: 02/19/21 PASRR Number: [id number]  Discharge Plan: SNF    Current Diagnoses: Patient Active Problem List   Diagnosis Date Noted   Protein-calorie malnutrition, severe 03/23/2021   Controlled type 2 diabetes mellitus with hyperglycemia, with long-term current use of insulin (HCC) 02/11/2021   Hyperlipidemia    NPH (normal pressure hydrocephalus) (HCC)    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    Bipolar 1 disorder, depressed, full remission (HCC) 01/18/2021   Generalized weakness 01/11/2021   Acute metabolic encephalopathy 10/22/2020   Hypotension 10/22/2020   Leukocytosis 10/22/2020   Type 2 diabetes mellitus (HCC) 10/22/2020   Altered mental status    Hyperosmolar hyperglycemic state (HHS) (HCC)    Hyperglycemia    Hyperkalemia    AKI (acute kidney injury) (HCC)    Hypertension    DKA (diabetic ketoacidosis) (HCC) 09/06/2020   COVID-19 09/06/2020   Bipolar 1 disorder (HCC) 09/06/2020   Breast mass status post excision 09/06/2020   Smoker 09/06/2020   Metabolic acidosis due to ingestion of drugs or chemicals 09/06/2020    Orientation RESPIRATION BLADDER Height & Weight     Self  Normal Incontinent Weight: 154 lb 5.2 oz (70 kg) Height:  5\' 6"  (167.6 cm) (on 01/11/2021)  BEHAVIORAL SYMPTOMS/MOOD NEUROLOGICAL BOWEL NUTRITION STATUS     (Normal Pressure  Hydrocephalus) Incontinent Diet (Normal)  AMBULATORY STATUS COMMUNICATION OF NEEDS Skin   Extensive Assist Verbally Normal                       Personal Care Assistance Level of Assistance  Bathing, Feeding, Dressing Bathing Assistance: Limited assistance Feeding assistance: Limited assistance Dressing Assistance: Limited assistance     Functional Limitations Info  Sight, Hearing, Speech Sight Info: Adequate Hearing Info: Adequate Speech Info: Adequate    SPECIAL CARE FACTORS FREQUENCY  PT (By licensed PT), OT (By licensed OT)     PT Frequency: 3x weekly OT Frequency: 3x weekly            Contractures Contractures Info: Not present    Additional Factors Info  Code Status, Allergies, Psychotropic, Insulin Sliding Scale Code Status Info: DNR Allergies Info: Erythromycin Psychotropic Info: Depakote, Lexapro, Neurontin, Ativan Insulin Sliding Scale Info: See discharge summary Isolation Precautions Info: None Suctioning Needs: n/a   Current Medications (04/22/2021):  This is the current hospital active medication list Current Facility-Administered Medications  Medication Dose Route Frequency Provider Last Rate Last Admin   (feeding supplement) PROSource Plus liquid 30 mL  30 mL Oral TID BM 04/24/2021, MD   30 mL at 04/21/21 2059   acetaminophen (TYLENOL) tablet 650 mg  650 mg Oral Q6H PRN 2060, DO   650 mg at 04/21/21 2058   amLODipine (NORVASC) tablet 5 mg  5 mg Oral Daily 2059, MD   5 mg at 04/22/21 0818   bisacodyl (DULCOLAX)  suppository 10 mg  10 mg Rectal Daily PRN Tyrone Nine, MD   10 mg at 03/20/21 1727   dextrose 50 % solution 0-50 mL  0-50 mL Intravenous PRN Mancel Bale, MD   50 mL at 02/19/21 1628   docusate sodium (COLACE) capsule 100 mg  100 mg Oral BID Russella Dar, NP   100 mg at 04/21/21 2058   enoxaparin (LOVENOX) injection 40 mg  40 mg Subcutaneous Q24H Silvana Newness, RPH   40 mg at 04/22/21 0818   feeding  supplement (GLUCERNA SHAKE) (GLUCERNA SHAKE) liquid 237 mL  237 mL Oral TID BM Glade Lloyd, MD   237 mL at 04/21/21 2059   hydrALAZINE (APRESOLINE) tablet 25 mg  25 mg Oral Q6H PRN Russella Dar, NP       insulin aspart (novoLOG) injection 0-20 Units  0-20 Units Subcutaneous TID WC Russella Dar, NP       insulin aspart (novoLOG) injection 0-5 Units  0-5 Units Subcutaneous QHS Russella Dar, NP       insulin aspart (novoLOG) injection 3 Units  3 Units Subcutaneous TID WC Pokhrel, Laxman, MD   3 Units at 04/22/21 0818   insulin glargine-yfgn (SEMGLEE) injection 18 Units  18 Units Subcutaneous Daily Russella Dar, NP   18 Units at 04/22/21 0819   And   insulin glargine-yfgn (SEMGLEE) injection 8 Units  8 Units Subcutaneous QHS Russella Dar, NP   8 Units at 04/21/21 2057   ipratropium-albuterol (DUONEB) 0.5-2.5 (3) MG/3ML nebulizer solution 3 mL  3 mL Nebulization Q6H PRN Pokhrel, Laxman, MD       metoprolol tartrate (LOPRESSOR) tablet 50 mg  50 mg Oral BID Leroy Sea, MD   50 mg at 04/22/21 0820   multivitamin liquid 15 mL  15 mL Oral Daily Leroy Sea, MD   15 mL at 04/22/21 0818   OLANZapine zydis (ZYPREXA) disintegrating tablet 5 mg  5 mg Oral QHS Russella Dar, NP   5 mg at 04/21/21 2057   ondansetron (ZOFRAN) injection 4 mg  4 mg Intravenous Q6H PRN Dow Adolph N, DO   4 mg at 02/03/21 1946   polyethylene glycol (MIRALAX / GLYCOLAX) packet 17 g  17 g Oral Daily Pokhrel, Laxman, MD   17 g at 04/22/21 7544   predniSONE (STERAPRED UNI-PAK 21 TAB) tablet 10 mg  10 mg Oral PC lunch Russella Dar, NP       predniSONE (STERAPRED UNI-PAK 21 TAB) tablet 10 mg  10 mg Oral PC supper Russella Dar, NP       [START ON 04/23/2021] predniSONE (STERAPRED UNI-PAK 21 TAB) tablet 10 mg  10 mg Oral 3 x daily with food Russella Dar, NP       [START ON 04/24/2021] predniSONE (STERAPRED UNI-PAK 21 TAB) tablet 10 mg  10 mg Oral 4X daily taper Russella Dar, NP        predniSONE (STERAPRED UNI-PAK 21 TAB) tablet 20 mg  20 mg Oral AC breakfast Russella Dar, NP       predniSONE (STERAPRED UNI-PAK 21 TAB) tablet 20 mg  20 mg Oral Nightly Russella Dar, NP       [START ON 04/23/2021] predniSONE (STERAPRED UNI-PAK 21 TAB) tablet 20 mg  20 mg Oral Nightly Russella Dar, NP       senna (SENOKOT) tablet 8.6 mg  1 tablet Oral BID Pokhrel, Laxman, MD   8.6 mg  at 04/22/21 0818     Discharge Medications: Please see discharge summary for a list of discharge medications.  Relevant Imaging Results:  Relevant Lab Results:   Additional Information SS [id number], patient will need LOG for placement while Medicaid and disability are pending  Inis Sizer, LCSW

## 2021-04-22 NOTE — Progress Notes (Signed)
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 -- ongoing  GOAL:  Patient will meet greater than or equal to 90% of their needs -- progressing  MONITOR:  PO intake, Supplement acceptance, Labs, Weight trends, TF tolerance  REASON FOR ASSESSMENT:  Consult Calorie Count, Poor PO  ASSESSMENT:  55 year old female past medical history of diabetes mellitus type 2, bipolar disorder type I, previous COVID infection, polysubstance abuse that includes cocaine.  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 Pt has been at [facility] for the past fifty-three days is not eating or drinking sufficiently. Palliative care has been asked to get re-involved to further discuss goals of care in the setting of progressive failure to thrive and generalized weakness.  7/04 - megace initiated 7/09 - megace dose increased to BID 7/15 - cortrak placed (tip gastric per xray) 7/23 - cortrak removed 7/25 repeat EEG with moderate diffuse encephalopathy though no evidence of seizures or other abnormality noted  Mental status/level of alertness continues to improve, presumed to be 2/2 addition of Zyprexa on 8/2.   PO Intake: 0-75% x last 8 recorded meals (~29% average meal intake)  Per RN, pt continues to do well with oral nutrition supplements. Continue current nutrition plan of care as pt does not wish to have TF.   Medications:  (feeding supplement) PROSource Plus  30 mL Oral TID BM   amLODipine  5 mg Oral Daily   docusate sodium  100 mg Oral BID    enoxaparin (LOVENOX) injection  40 mg Subcutaneous Q24H   feeding supplement (GLUCERNA SHAKE)  237 mL Oral TID BM   insulin aspart  0-20 Units Subcutaneous TID WC   insulin aspart  0-5 Units Subcutaneous QHS   insulin aspart  3 Units Subcutaneous TID WC   insulin glargine-yfgn  12 Units Subcutaneous QHS   And   [START ON 04/23/2021] insulin glargine-yfgn  22 Units Subcutaneous Daily   metoprolol tartrate  50 mg Oral BID   multivitamin  15 mL Oral Daily   OLANZapine zydis  5 mg Oral QHS   polyethylene glycol  17 g Oral Daily   predniSONE  10 mg Oral PC supper   [START ON 04/23/2021] predniSONE  10 mg Oral 3 x daily with food   [START ON 04/24/2021] predniSONE  10 mg Oral 4X daily taper   predniSONE  20 mg Oral Nightly   [START ON 04/23/2021] predniSONE  20 mg Oral Nightly   senna  1 tablet Oral BID   Labs: Recent Labs  Lab 04/21/21 1448  NA 136  K 3.9  CL 100  CO2 28  BUN 19  CREATININE 0.89  CALCIUM 9.5  GLUCOSE 227*  CBGs [phone] (Diabetes Coordinator following)  UOP: x24 hours I/O: -14.8L since admit  Admit weight: 67.5 kg Current weight: 70 kg  Diet Order:   Diet Order             Diet regular Room service appropriate? Yes; Fluid consistency: Thin  Diet effective now                  EDUCATION NEEDS:  No education needs have been identified at this time  Skin:  Skin Assessment: Reviewed RN Assessment  Last BM:  8/15  Height:  Ht Readings from Last 1 Encounters:  03/15/21 5\' 6"  (1.676 m)   Weight:  Wt Readings from Last 1 Encounters:  04/21/21 70 kg   Ideal Body Weight:  59.1 kg  BMI:  Body mass index is 24.91 kg/m.  Estimated Nutritional Needs:  Kcal:  1850-2050 Protein:  90-110 grams Fluid:  >/= 1.8 L    04/23/21, MS, RD, LDN (she/her/hers) RD pager number and weekend/on-call pager number located in Amion.

## 2021-04-22 NOTE — Progress Notes (Signed)
Occupational Therapy Treatment Patient Details Name: Carolyn Norton MRN: [id number] DOB: 10-14-1965 Today's Date: 04/22/2021    History of present illness Pt is a 55 y.o. female initially admitted nearly 3 months ago on 01/11/21 with multiple falls, AMS, weakness. Workup for severe dehydration, DKA and metabolic encephalopathy. 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 7/1 underwent lumbar puncture and drained large volume of CSF to assess for potential improvement in symptomology (none noted);  7/2 MRI of the brain, cervical, thoracic and lumbar spine with no acute changes to explain LE weakness. PT signed off 7/12 secondary to lack of progress and further cognitive/functional decline. Cortrak placed 7/15 secondary to poor [email or web address].is. Family declining comfort measures. On 8/2 patient with increased alertness per MD after beginning medication for anorexia and PT/OT reiniated. PMH includes DM2, COPD, CKD3, GERD, bipolar disorder, COVID-19, polysubstance abuse including cocaine.   OT comments  Patient met lying supine in bed pleasant/smiling and joking with this Probation officer. Focus of session on LB dressing at bedlevel, functional transfers and command following. Patient requiring less assist for bed mobility rolling in supine with Min A and requiring Min to Mod A to come to sitting at EOB. Portion of session with focus on static sitting at EOB and while reaching outside of BOS in prep for sit to stand transfers. Patient continues to require +2 assist for sit to stand and stand/squat pivot transfers given poor BLE flexibility and continued cognitive deficits including decreased ability to sequence even familiar tasks. Order placed for off until privileges to allow patient to sit at nurses station to encourage social interaction. OT will continue to follow acutely.    Follow Up Recommendations  Other (comment);SNF (Long-term/custodial care)    Equipment Recommendations  Other  (comment) (TBD)    Recommendations for Other Services      Precautions / Restrictions Precautions Precautions: Fall Precaution Comments: High fall risk Restrictions Weight Bearing Restrictions: No       Mobility Bed Mobility Overal bed mobility: Needs Assistance Bed Mobility: Rolling Rolling: Mod assist Sidelying to sit: Min assist;Mod assist       General bed mobility comments: Min A rolling L and Min to Mod A to elevate trunk to sit at EOB.    Transfers Overall transfer level: Needs assistance Equipment used: Rolling walker (2 wheeled) Transfers: Sit to/from Stand Sit to Stand: Mod assist;+2 physical assistance;+2 safety/equipment;Max assist Stand pivot transfers: Mod assist;Max assist;+2 physical assistance;+2 safety/equipment Squat pivot transfers: Max assist;+2 physical assistance;Mod assist     General transfer comment: Mod A +2 initially to stand but requries Max A +2 for anterior weight shift. Max A +2 for modified stand-pivot to recliner on L. Patient with continued strong posterior bias. L foot slides along ground.    Balance Overall balance assessment: Needs assistance Sitting-balance support: Bilateral upper extremity supported;Feet supported Sitting balance-Leahy Scale: Poor Sitting balance - Comments: Requires Mod A initially to maintain static sitting balance at EOB; progressed to Min guard with cues for anterior weight shift. Postural control: Posterior lean Standing balance support: During functional activity;Bilateral upper extremity supported Standing balance-Leahy Scale: Poor Standing balance comment: Requires BUE support and +2 assist. Strong posterior bias. Difficulty shifting weight anteriorly even with max multimodal cues.                           ADL either performed or assessed with clinical judgement   ADL Overall ADL's : Needs assistance/impaired Eating/Feeding: Sitting;Supervision/ safety  Lower Body  Dressing: Moderate assistance;Bed level Lower Body Dressing Details (indicate cue type and reason): Figure-4 position in long sitting; forward chaining with patient able to pull bilateral socks over heels. Patient then able to thread BLE through mesh panties and bridge in supine with assist to hike over hips at bed leveel. Toilet Transfer: Maximal assistance;+2 for physical assistance;+2 for safety/equipment Toilet Transfer Details (indicate cue type and reason): Simulated with modified squat-pivot to recliner on L; requires max mutlimodal cues for sequencing.                 Vision       Perception     Praxis      Cognition Arousal/Alertness: Awake/alert Behavior During Therapy: WFL for tasks assessed/performed Overall Cognitive Status: Impaired/Different from baseline Area of Impairment: Attention;Following commands;Problem solving                 Orientation Level: Disoriented to;Time;Situation Current Attention Level: Sustained Memory: Decreased short-term memory;Decreased recall of precautions Following Commands: Follows one step commands with increased time Safety/Judgement: Decreased awareness of deficits;Decreased awareness of safety Awareness: Intellectual Problem Solving: Slow processing;Decreased initiation;Difficulty sequencing;Requires verbal cues;Requires tactile cues General Comments: Follows 1-step verbal commands wtih 85% accuracy this date; smiling/pleasant        Exercises General Exercises - Lower Extremity Ankle Circles/Pumps: Both;5 reps;Supine;AAROM Long Arc Quad: AAROM;5 reps;Both;Supine Heel Slides: AAROM;Both;5 reps;Supine Other Exercises Other Exercises: Stretching to bil heel cords and hamstrings. Placed PRAFOS on at end of session.   Shoulder Instructions       General Comments Pt smiling and participating well.    Pertinent Vitals/ Pain       Pain Assessment: No/denies pain  Home Living                                           Prior Functioning/Environment              Frequency  Min 2X/week        Progress Toward Goals  OT Goals(current goals can now be found in the care plan section)     Acute Rehab OT Goals Patient Stated Goal: To return home with her mother/father. OT Goal Formulation: Patient unable to participate in goal setting Time For Goal Achievement: 04/23/21 Potential to Achieve Goals: Fair ADL Goals Pt Will Perform Eating: with set-up;sitting Pt Will Perform Grooming: with set-up;sitting Pt Will Perform Upper Body Dressing: with min assist;sitting Pt Will Perform Lower Body Dressing: with mod assist;sit to/from stand Pt Will Transfer to Toilet: with mod assist;bedside commode Pt Will Perform Toileting - Clothing Manipulation and hygiene: with mod assist;sit to/from stand;sitting/lateral leans Pt/caregiver will Perform Home Exercise Program: Increased strength;Both right and left upper extremity;With theraband;With written HEP provided Additional ADL Goal #1: Patietn will follow 1-step verbal commands with 95% accuracy in prep for ADLs at bed level vs. in sitting. Additional ADL Goal #2: Patient will follow 1-step verbal commands with 50% accuracy in prep for ADLs. Additional ADL Goal #3: Patient will complete 3/3 grooming tasks in supported sitting position with Mod A and Max multimodal cues.  Plan Discharge plan remains appropriate;Frequency needs to be updated    Co-evaluation    PT/OT/SLP Co-Evaluation/Treatment: Yes Reason for Co-Treatment: Complexity of the patient's impairments (multi-system involvement) PT goals addressed during session: Mobility/safety with mobility OT goals addressed during session: ADL's and self-care  AM-PAC OT "6 Clicks" Daily Activity     Outcome Measure   Help from another person eating meals?: A Little Help from another person taking care of personal grooming?: A Little Help from another person toileting, which  includes using toliet, bedpan, or urinal?: Total Help from another person bathing (including washing, rinsing, drying)?: Total Help from another person to put on and taking off regular upper body clothing?: A Lot Help from another person to put on and taking off regular lower body clothing?: A Lot 6 Click Score: 12    End of Session Equipment Utilized During Treatment: Gait belt  OT Visit Diagnosis: Unsteadiness on feet (R26.81);Other abnormalities of gait and mobility (R26.89);Muscle weakness (generalized) (M62.81);Other symptoms and signs involving cognitive function;Other symptoms and signs involving the nervous system (R29.898)   Activity Tolerance Other (comment) (Poor participation this a.m.)   Patient Left in bed;with call bell/phone within reach;with bed alarm set   Nurse Communication Mobility status;Other (comment) (Patient with poor participation this a.m.)        Time: 3010-4045 OT Time Calculation (min): 39 min  Charges: OT General Charges $OT Visit: 1 Visit OT Treatments $Self Care/Home Management : 8-22 mins $Therapeutic Activity: 8-22 mins  Janeisha Ryle H. OTR/L Supplemental OT, Department of rehab services [phone]   Floy Angert R H. 04/22/2021, 2:23 PM

## 2021-04-22 NOTE — Progress Notes (Signed)
Inpatient Diabetes Program Recommendations  AACE/ADA: New Consensus Statement on Inpatient Glycemic Control (2015)  Target Ranges:  Prepandial:   less than 140 mg/dL      Peak postprandial:   less than 180 mg/dL (1-2 hours)      Critically ill patients:  140 - 180 mg/dL   Lab Results  Component Value Date   GLUCAP 448 (H) 04/22/2021   HGBA1C 8.8 (H) 03/20/2021    Review of Glycemic Control Results for Carolyn Norton, Carolyn Norton (MRN [id number]) as of 04/22/2021 10:45  Ref. Range 04/21/2021 07:54 04/21/2021 12:44 04/21/2021 17:36 04/21/2021 20:51 04/22/2021 07:47  Glucose-Capillary Latest Ref Range: 70 - 99 mg/dL 324 (H) 401 (H) 027 (H) 191 (H) 448 (H)  Diabetes history: DM2 Current orders for Inpatient glycemic control: Semglee 18 units hs, 8 units hs, Novolog 3 units tid meal coverage, Novolog 0-20 units tid with meals and HS Prednisone dose pack taper Inpatient Diabetes Program Recommendations:    Consider increasing PM Semlgee to 14 units q HS while on steroids.   Thanks,  Beryl Meager, RN, BC-ADM Inpatient Diabetes Coordinator Pager [phone]  (8a-5p)

## 2021-04-22 NOTE — Progress Notes (Addendum)
[facility] PROGRESS NOTE  Carolyn Norton:[id number] DOB: 08/26/1966 DOA: 01/11/2021 PCP: Carolyn Norton            March 27, 2021     Status: Remains inpatient appropriate because:Unsafe d/c plan-due to patient's underlying poor mental status short-term memory deficits she will require 24/7 care after discharge  Dispo:  Patient From:  Home  Planned Disposition: SNF w/ Hospice follow up at facility  Medically stable for discharge:  yes  Barriers to DC: Medicaid still pending; prior history of substance abuse and underlying documented bipolar disorder             Difficult to place: Yes  Level of care: Med-Surg  Code Status: DNR Family Communication: Carolyn Norton 8/03.  Updated on patient's improved mental status and suspicions that the low-dose Zyprexa that was initiated for anorexia has actually improved her mental status.  Explained that we are not certain what is the cause of her changes but clearly this medication appears to be improving her overall alertness. DVT prophylaxis: Lovenox COVID vaccination status: Unknown-post COVID infection 09/06/20   HPI: 55 year old female past medical history of diabetes mellitus type 2, bipolar disorder type I, previous COVID infection, polysubstance abuse that includes cocaine.  She presented from home where she lives with her parents.  Her presenting symptoms included generalized weakness, multiple falls and confusion.  Family confirmed that she had been having the symptoms for at least a month with last use of crack cocaine 1 month prior to presentation.  Ever since that time she had not been doing well.  She would also be having issues with hyperglycemia.  In the ER she was diagnosed with severe dehydration, hyperglycemia and metabolic encephalopathy.  Since admission patient has been evaluated by the psychiatric team who has deemed her alert and oriented and having capacity to make decisions.  Patient is agreeable to  placement at a rehab.  Currently she has severe physical deconditioning which was POA and PT is recommending SNF.  Because of her history of polysubstance abuse been difficult to obtain an SNF bed.  Initially her family was agreeable to taking her back in home when she is able to ambulate well enough to get back and forth for meals into the bathroom.  Unfortunately her cognitive and physical status has progressively declined.  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  Palliative medicine assisted but have signed off. Patient's previous advanced directives stated she did not want a PEG tube if she were unable to eat. Unfortunately no improvement with short term enteral feeds. Cortrak dc'd. Famliy not ready for comfort measures.   Beginning on 8/2 patient had improved mentation.  Likely 2/2 recent addition of Zyprexa  Subjective: Much more awake and appropriate today.  Denies chest discomfort or breathing discomfort.  Objective: Vitals:   04/21/21 1920 04/21/21 2049  BP:  124/69  Pulse: 79 96  Resp: 16 18  Temp:  98.3 F (36.8 C)  SpO2: 99% 99%    Intake/Output Summary (Last 24 hours) at 04/22/2021 0814 Last data filed at 04/21/2021 2055 Gross per 24 hour  Intake 120 ml  Output 750 ml  Net -630 ml    Filed Weights   04/19/21 0232 04/20/21 0500 04/21/21 0600  Weight: 68.5 kg 70.4 kg 70 kg   Constitutional: Awake and alert Respiratory: Lungs are clear to auscultation, no increased work of breathing at rest, stable on room air  Abdomen: LBM 8/15, nontender with normoactive bowel sounds.  Oral intake remains marginal Neurologic: Cranial nerves are grossly intact.  More drowsy today but patient has awareness of that.  No focal neurological deficits.  Able to squeeze hands briskly and equally when asked although strength still 3/5 Psychiatric: Alert and oriented times name and place still not oriented to  year.  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   Thorough evaluation revealed symptoms not related to NPH MRI of her spine without any abnormalities to explain her progressive lower extremity weakness. Neurology suspects chronic progressive neurological decline 2/2 longstanding drug abuse. 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  Repeat EEG and MRI unchanged from prior studies Improvement in mentation or addition of Zyprexa  Atypical chest pain Onset yesterday afternoon and was constant in nature EKG and troponins unremarkable Suspect secondary to COPD exacerbation  COPD exacerbation Chest x-ray unremarkable-no leukocytosis Continue IV steroids an additional 24 hours then transition to prednisone Continue scheduled DuoNebs for now Currently stable on room air  Dysphagia/failure to thrive secondary to multifactorial neurological condition No improvement after Megace. No improvement after 10 days of cortrack tube feedings.  No PEG per patient recently documented wishes.   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  She was able to sit up in a chair for 15 minutes was able to progress to minimal guard minimum assist level.  She was also able to stand and pivot to  the chair with moderate assist x2   Diabetes mellitus 2 with hyperglycemia on insulin Preadmission hemoglobin A1c greater than 11 CBGs improved after adjustments in long-acting insulin.   8/16 given significant hyperglycemia while on steroid taper-increase a.m. Semglee to 22 units and p.m. Semglee to 12 units Continue 3 units meal coverage Continue SSI but increased to resistant while on steroids  History of bipolar 1 disorder/polysubstance abuse/cognitive impairment Continue to hold home escitalopram, Depakote and Neurontin secondary to persistent altered mentation and bipolar symptoms not present Given improvement in mental status will continue Zyprexa 5 mg to treat her bipolar disorder.   Elevated blood pressure/New dx HTN/murmur No prior documented history of hypertension  Continue low-dose Norvasc 8/2 Echocardiogram this admission revealed no valvular abnormalities but was c/w diastolic dysfunction; murmur has resolved was likely 2/2 hyperdynamic state  Suspected constipation Given 1 dose of milk of magnesia w/ appropriate results  Ventriculomegaly/? NPH NPH ruled out as above and is likely secondary to atrophy and generalized volume loss  Dyslipidemia Hold statin until oral intake improves  Physical deconditioning/bilateral heel cord shortening Continue bilateral PRAFO boots on every 4 hours and off every 4 hours Given progressive neurological decline unable to participate with therapies    Other problems: GERD Oral PPI on hold while n.p.o.  Hypokalemia/hypomagnesemia Resolved  E. Coli [name] UTI Treated Use purewick only at bedtime    Data Reviewed: Basic Metabolic Panel: Recent Labs  Lab 04/21/21 1448  NA 136  K  3.9  CL 100  CO2 28  GLUCOSE 227*  BUN 19  CREATININE 0.89  CALCIUM 9.5    Liver Function Tests: Recent Labs  Lab 04/21/21 1448  AST 14*  ALT 12  ALKPHOS 64  BILITOT 0.3  PROT 6.4*  ALBUMIN 3.0*      No results for  input(s): LIPASE, AMYLASE in the last 168 hours. No results for input(s): AMMONIA in the last 168 hours. CBC: Recent Labs  Lab 04/21/21 1448  WBC 9.5  NEUTROABS 4.8  HGB 13.5  HCT 40.5  MCV 89.4  PLT 352     Cardiac Enzymes: No results for input(s): CKTOTAL, CKMB, CKMBINDEX, TROPONINI in the last 168 hours. BNP (last 3 results) Recent Labs    03/24/21 0856 03/25/21 0203 03/26/21 0222  BNP 29.2 33.9 37.9    ProBNP (last 3 results) No results for input(s): PROBNP in the last 8760 hours.  CBG: Recent Labs  Lab 04/21/21 0754 04/21/21 1244 04/21/21 1736 04/21/21 2051 04/22/21 0747  GLUCAP 152* 220* 286* 191* 448*    No results found for this or any previous visit (from the past 240 hour(s)).     Studies: DG CHEST PORT 1 VIEW  Result Date: 04/21/2021 CLINICAL DATA:  Hypoxia EXAM: PORTABLE CHEST 1 VIEW COMPARISON:  Radiograph 03/30/2021 FINDINGS: The heart size and mediastinal contours are within normal limits.No focal airspace disease. No pleural effusion or pneumothorax.No acute osseous abnormality. Unchanged multiple old right rib injuries. IMPRESSION: No evidence of acute cardiopulmonary disease. Electronically Signed   By: Caprice Renshaw M.D.   On: 04/21/2021 15:04    Scheduled Meds:  (feeding supplement) PROSource Plus  30 mL Oral TID BM   amLODipine  5 mg Oral Daily   docusate sodium  100 mg Oral BID   enoxaparin (LOVENOX) injection  40 mg Subcutaneous Q24H   feeding supplement (GLUCERNA SHAKE)  237 mL Oral TID BM   insulin aspart  0-20 Units Subcutaneous TID WC   insulin aspart  0-5 Units Subcutaneous QHS   insulin aspart  3 Units Subcutaneous TID WC   insulin glargine-yfgn  18 Units Subcutaneous Daily   And   insulin glargine-yfgn  8 Units Subcutaneous QHS   methylPREDNISolone (SOLU-MEDROL) injection  120 mg Intravenous Q12H   metoprolol tartrate  50 mg Oral BID   multivitamin  15 mL Oral Daily   OLANZapine zydis  5 mg Oral QHS   polyethylene glycol  17  g Oral Daily   senna  1 tablet Oral BID   Continuous Infusions:      Principal Problem:   Bipolar 1 disorder, depressed, full remission (HCC) Active Problems:   Bipolar 1 disorder (HCC)   Hypertension   Generalized weakness   Controlled type 2 diabetes mellitus with hyperglycemia, with long-term current use of insulin (HCC)   Hyperlipidemia   NPH (normal pressure hydrocephalus) (HCC)   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   Protein-calorie malnutrition, severe   Consultants: Psychiatry Neurology Neurosurgery  Procedures: None  Antibiotics: Anti-infectives (From admission, onward)    Start     Dose/Rate Route Frequency Ordered Stop   03/06/21 0900  ceFAZolin (ANCEF) IVPB 1 g/50 mL premix        1 g 100 mL/hr over 30 Minutes Intravenous Every 8 hours 03/06/21 0801 03/10/21 0559   03/04/21 1430  cefTRIAXone (ROCEPHIN) 1 g in sodium chloride 0.9 % 100 mL IVPB  Status:  Discontinued        1 g 200 mL/hr over  30 Minutes Intravenous Every 24 hours 03/04/21 1335 03/06/21 0800   02/14/21 0828  vancomycin (VANCOREADY) IVPB 1000 mg/200 mL  Status:  Discontinued        1,000 mg 200 mL/hr over 60 Minutes Intravenous 60 min pre-op 02/14/21 0828 03/02/21 1139   02/13/21 2115  cefTRIAXone (ROCEPHIN) 1 g in sodium chloride 0.9 % 100 mL IVPB  Status:  Discontinued        1 g 200 mL/hr over 30 Minutes Intravenous Every 24 hours 02/13/21 2016 02/16/21 0844        Time spent: 15 minutes    [name] ANP  [facility] 7 am - 330 pm/M-F for direct patient care and secure chat Please refer to Amion for contact info 100  days

## 2021-04-23 LAB — GLUCOSE, CAPILLARY
Glucose-Capillary: 376 mg/dL — ABNORMAL HIGH (ref 70–99)
Glucose-Capillary: 351 mg/dL — ABNORMAL HIGH (ref 70–99)
Glucose-Capillary: 121 mg/dL — ABNORMAL HIGH (ref 70–99)
Glucose-Capillary: 235 mg/dL — ABNORMAL HIGH (ref 70–99)

## 2021-04-23 MED ORDER — INSULIN GLARGINE-YFGN 100 UNIT/ML ~~LOC~~ SOLN
14.0000 [IU] | Freq: Every day | SUBCUTANEOUS | Status: DC
  Administered 2021-04-23 – 2021-04-27 (×5): 14 [IU] via SUBCUTANEOUS
  Filled 2021-04-23 (×6): qty 0.14

## 2021-04-23 MED ORDER — INSULIN GLARGINE-YFGN 100 UNIT/ML ~~LOC~~ SOLN
26.0000 [IU] | Freq: Every day | SUBCUTANEOUS | Status: DC
  Administered 2021-04-24 – 2021-04-28 (×5): 26 [IU] via SUBCUTANEOUS
  Filled 2021-04-23 (×6): qty 0.26

## 2021-04-23 NOTE — TOC Progression Note (Addendum)
Transition of Care [facility]) - Progression Note    Patient Details  Name: Carolyn Norton MRN: [id number] Date of Birth: 07-Dec-1965  Transition of [facility]) CM/SW Contact  Janae Bridgeman, RN Phone Number: 04/23/2021, 1:05 PM  Clinical Narrative:    CM spoke with Edwin Dada, MSW at the patient was declined for bed offer at [facility]  I faxed clinicals out in the hub to numerous other skilled nursing facilities and will continue to call facilities to inquire about bed availability.  I sent an email to Christia Reading, financial counseling supervisor to find out the progress with patient's disability and Medicaid application.  I also called and left a voice mail message with Mertha Baars, DSS to ask about progress of disability and Medicaid application.  I called and left a message with the patient's father, Nataliee Shurtz to update him that no current bed offers are available at this time.  CM and MSW with DTP Team will continue to follow the patient for needed SNF placement.  04/23/2021 1319 - I called and spoke with Essie Hart, CM at [facility] and she is going to review clinicals.  I re-faxed clinicals in the hub and will follow up with the facility.   Expected Discharge Plan: Hospice Medical Facility Barriers to Discharge: Inadequate or no insurance, Family Issues  Expected Discharge Plan and Services Expected Discharge Plan: Hospice Medical Facility In-house Referral: Clinical Social Work Discharge Planning Services: CM Consult Post Acute Care Choice: Skilled Nursing Facility Living arrangements for the past 2 months: Single Family Home Expected Discharge Date: 01/16/21               DME Arranged: [name] rolling DME Agency: [employer] Date DME Agency Contacted: 01/20/21 Time DME Agency Contacted: 1435 Representative spoke with at DME Agency: [name]             Social Determinants of Health (SDOH) Interventions    Readmission Risk  Interventions Readmission Risk Prevention Plan 01/14/2021  Transportation Screening Complete  PCP or Specialist Appt within 3-5 Days (No Data)  Social Work Consult for Recovery Care Planning/Counseling Complete  Palliative Care Screening Not Applicable  Medication Review Oceanographer) Referral to Pharmacy  Some recent data might be hidden

## 2021-04-23 NOTE — Progress Notes (Signed)
[facility] PROGRESS NOTE  Allysa Governale SWN:[id number] DOB: 1966-06-19 DOA: 01/11/2021 PCP: [name], FNP            March 27, 2021     Status: Remains inpatient appropriate because:Unsafe d/c plan-due to patient's underlying poor mental status short-term memory deficits she will require 24/7 care after discharge  Dispo:  Patient From:  Home  Planned Disposition: SNF w/ Hospice follow up at facility  Medically stable for discharge:  yes  Barriers to DC: Medicaid still pending; prior history of substance abuse and underlying documented bipolar disorder             Difficult to place: Yes  Level of care: Med-Surg  Code Status: DNR Family Communication: Father 8/03.  Updated on patient's improved mental status and suspicions that the low-dose Zyprexa that was initiated for anorexia has actually improved her mental status.  Explained that we are not certain what is the cause of her changes but clearly this medication appears to be improving her overall alertness. DVT prophylaxis: Lovenox COVID vaccination status: Unknown-post COVID infection 09/06/20   HPI: 55 year old female past medical history of diabetes mellitus type 2, bipolar disorder type I, previous COVID infection, polysubstance abuse that includes cocaine.  She presented from home where she lives with her parents.  Her presenting symptoms included generalized weakness, multiple falls and confusion.  Family confirmed that she had been having the symptoms for at least a month with last use of crack cocaine 1 month prior to presentation.  Ever since that time she had not been doing well.  She would also be having issues with hyperglycemia.  In the ER she was diagnosed with severe dehydration, hyperglycemia and metabolic encephalopathy.  Since admission patient has been evaluated by the psychiatric team who has deemed her alert and oriented and having capacity to make decisions.  Patient is agreeable to  placement at a rehab.  Currently she has severe physical deconditioning which was POA and PT is recommending SNF.  Because of her history of polysubstance abuse been difficult to obtain an SNF bed.  Initially her family was agreeable to taking her back in home when she is able to ambulate well enough to get back and forth for meals into the bathroom.  Unfortunately her cognitive and physical status has progressively declined.  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  Palliative medicine assisted but have signed off. Patient's previous advanced directives stated she did not want a PEG tube if she were unable to eat. Unfortunately no improvement with short term enteral feeds. Cortrak dc'd. Famliy not ready for comfort measures.   Beginning on 8/2 patient had improved mentation.  Likely 2/2 recent addition of Zyprexa  Subjective: Awake.  Continues to report that she is not hungry.  States she is tired.  It is noted that she had extensive PT and OT yesterday  Objective: Vitals:   04/22/21 0820 04/22/21 2009  BP: (!) 141/70 133/77  Pulse: 99 90  Resp:  18  Temp:  98.5 F (36.9 C)  SpO2:  96%   No intake or output data in the 24 hours ending 04/23/21 0806   Filed Weights   04/19/21 0232 04/20/21 0500 04/21/21 0600  Weight: 68.5 kg 70.4 kg 70 kg   Constitutional: Awake and alert Respiratory: Clear to auscultation, stable on room air without increased work of breathing Abdomen: LBM 8/15, nontender with normoactive bowel sounds.  Oral intake remains marginal Neurologic: Cranial nerves are grossly intact.  More drowsy today but patient has awareness of that.  No focal neurological deficits.  Upper and lower extremity strength 3/5 Psychiatric: Alert and oriented times name and place still not oriented to year.  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   Thorough evaluation revealed symptoms not related to NPH MRI of her spine without any abnormalities to explain her progressive lower extremity weakness. Neurology suspects chronic progressive neurological decline 2/2 longstanding drug abuse. 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  Repeat EEG and MRI unchanged from prior studies Improvement in mentation or addition of Zyprexa  Atypical chest pain Onset yesterday afternoon and was constant in nature EKG and troponins unremarkable Suspect secondary to COPD exacerbation  COPD exacerbation Chest x-ray unremarkable-no leukocytosis Continue prednisone taper Continue scheduled DuoNebs for now Currently stable on room air  Dysphagia/failure to thrive secondary to multifactorial neurological condition No improvement after Megace. No improvement after 10 days of cortrack tube feedings.  No PEG per patient recently documented wishes.   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  She was able to sit up in a chair for 15 minutes was able to progress to minimal guard minimum assist level.  She was also able to stand and pivot to the chair with moderate assist x2   Diabetes mellitus 2 with hyperglycemia on insulin Preadmission hemoglobin A1c greater than 11 CBGs improved after  adjustments in long-acting insulin.   8/17: Persistent steroid-induced hyperglycemia so we will increase Semglee to 26 units a.m. and 14 units p.m. Continue 3 units meal coverage Continue SSI but increased to resistant while on steroids  History of bipolar 1 disorder/polysubstance abuse/cognitive impairment Continue to hold home escitalopram, Depakote and Neurontin secondary to persistent altered mentation and bipolar symptoms not present Given improvement in mental status will continue Zyprexa 5 mg to treat her bipolar disorder.   Elevated blood pressure/New dx HTN/murmur No prior documented history of hypertension  Continue low-dose Norvasc 8/2 Echocardiogram this admission revealed no valvular abnormalities but was c/w diastolic dysfunction; murmur has resolved was likely 2/2 hyperdynamic state  Suspected constipation Given 1 dose of milk of magnesia w/ appropriate results  Ventriculomegaly/? NPH NPH ruled out as above and is likely secondary to atrophy and generalized volume loss  Dyslipidemia Hold statin until oral intake improves  Physical deconditioning/bilateral heel cord shortening Continue bilateral PRAFO boots on every 4 hours and off every 4 hours Given progressive neurological decline unable to participate with therapies    Other problems: GERD Oral PPI on hold while n.p.o.  Hypokalemia/hypomagnesemia Resolved  E. Coli [name] UTI Treated Use purewick only at bedtime    Data Reviewed: Basic Metabolic Panel: Recent Labs  Lab 04/21/21 1448  NA 136  K 3.9  CL 100  CO2 28  GLUCOSE 227*  BUN 19  CREATININE 0.89  CALCIUM 9.5    Liver Function Tests: Recent  Labs  Lab 04/21/21 1448  AST 14*  ALT 12  ALKPHOS 64  BILITOT 0.3  PROT 6.4*  ALBUMIN 3.0*      No results for input(s): LIPASE, AMYLASE in the last 168 hours. No results for input(s): AMMONIA in the last 168 hours. CBC: Recent Labs  Lab 04/21/21 1448  WBC 9.5  NEUTROABS 4.8   HGB 13.5  HCT 40.5  MCV 89.4  PLT 352     Cardiac Enzymes: No results for input(s): CKTOTAL, CKMB, CKMBINDEX, TROPONINI in the last 168 hours. BNP (last 3 results) Recent Labs    03/24/21 0856 03/25/21 0203 03/26/21 0222  BNP 29.2 33.9 37.9    ProBNP (last 3 results) No results for input(s): PROBNP in the last 8760 hours.  CBG: Recent Labs  Lab 04/22/21 0747 04/22/21 1238 04/22/21 1622 04/22/21 1954 04/23/21 0750  GLUCAP 448* 536* 299* 344* 376*    No results found for this or any previous visit (from the past 240 hour(s)).     Studies: DG CHEST PORT 1 VIEW  Result Date: 04/21/2021 CLINICAL DATA:  Hypoxia EXAM: PORTABLE CHEST 1 VIEW COMPARISON:  Radiograph 03/30/2021 FINDINGS: The heart size and mediastinal contours are within normal limits.No focal airspace disease. No pleural effusion or pneumothorax.No acute osseous abnormality. Unchanged multiple old right rib injuries. IMPRESSION: No evidence of acute cardiopulmonary disease. Electronically Signed   By: Caprice RenshawJacob  Kahn M.D.   On: 04/21/2021 15:04    Scheduled Meds:  (feeding supplement) PROSource Plus  30 mL Oral TID BM   amLODipine  5 mg Oral Daily   docusate sodium  100 mg Oral BID   enoxaparin (LOVENOX) injection  40 mg Subcutaneous Q24H   feeding supplement (GLUCERNA SHAKE)  237 mL Oral TID BM   insulin aspart  0-20 Units Subcutaneous TID WC   insulin aspart  0-5 Units Subcutaneous QHS   insulin aspart  3 Units Subcutaneous TID WC   insulin glargine-yfgn  12 Units Subcutaneous QHS   And   insulin glargine-yfgn  22 Units Subcutaneous Daily   metoprolol tartrate  50 mg Oral BID   multivitamin  15 mL Oral Daily   OLANZapine zydis  5 mg Oral QHS   polyethylene glycol  17 g Oral Daily   predniSONE  10 mg Oral 3 x daily with food   [START ON 04/24/2021] predniSONE  10 mg Oral 4X daily taper   predniSONE  20 mg Oral Nightly   senna  1 tablet Oral BID   Continuous Infusions:      Principal Problem:    Bipolar 1 disorder, depressed, full remission (HCC) Active Problems:   Bipolar 1 disorder (HCC)   Hypertension   Generalized weakness   Controlled type 2 diabetes mellitus with hyperglycemia, with long-term current use of insulin (HCC)   Hyperlipidemia   NPH (normal pressure hydrocephalus) (HCC)   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   Protein-calorie malnutrition, severe   Consultants: Psychiatry Neurology Neurosurgery  Procedures: None  Antibiotics: Anti-infectives (From admission, onward)    Start     Dose/Rate Route Frequency Ordered Stop   03/06/21 0900  ceFAZolin (ANCEF) IVPB 1 g/50 mL premix        1 g 100 mL/hr over 30 Minutes Intravenous Every 8 hours 03/06/21 0801 03/10/21 0559   03/04/21 1430  cefTRIAXone (ROCEPHIN) 1 g in sodium chloride 0.9 % 100 mL IVPB  Status:  Discontinued        1 g 200 mL/hr over 30 Minutes Intravenous  Every 24 hours 03/04/21 1335 03/06/21 0800   02/14/21 0828  vancomycin (VANCOREADY) IVPB 1000 mg/200 mL  Status:  Discontinued        1,000 mg 200 mL/hr over 60 Minutes Intravenous 60 min pre-op 02/14/21 0828 03/02/21 1139   02/13/21 2115  cefTRIAXone (ROCEPHIN) 1 g in sodium chloride 0.9 % 100 mL IVPB  Status:  Discontinued        1 g 200 mL/hr over 30 Minutes Intravenous Every 24 hours 02/13/21 2016 02/16/21 0844        Time spent: 15 minutes    [name] ANP  [facility] 7 am - 330 pm/M-F for direct patient care and secure chat Please refer to Amion for contact info 101  days

## 2021-04-24 LAB — GLUCOSE, CAPILLARY
Glucose-Capillary: 252 mg/dL — ABNORMAL HIGH (ref 70–99)
Glucose-Capillary: 378 mg/dL — ABNORMAL HIGH (ref 70–99)
Glucose-Capillary: 97 mg/dL (ref 70–99)
Glucose-Capillary: 259 mg/dL — ABNORMAL HIGH (ref 70–99)

## 2021-04-24 NOTE — Progress Notes (Signed)
[facility] PROGRESS NOTE  Carolyn Norton JTT:[id number] DOB: 31-Dec-1965 DOA: 01/11/2021 PCP: [name], FNP            March 27, 2021     Status: Remains inpatient appropriate because:Unsafe d/c plan-due to patient's underlying poor mental status short-term memory deficits she will require 24/7 care after discharge  Dispo:  Patient From:  Home  Planned Disposition: SNF w/ Hospice follow up at facility  Medically stable for discharge:  yes  Barriers to DC: Medicaid still pending; prior history of substance abuse and underlying documented bipolar disorder             Difficult to place: Yes  Level of care: Med-Surg  Code Status: DNR Family Communication: Father 8/03.  Updated on patient's improved mental status and suspicions that the low-dose Zyprexa that was initiated for anorexia has actually improved her mental status.  Explained that we are not certain what is the cause of her changes but clearly this medication appears to be improving her overall alertness. DVT prophylaxis: Lovenox COVID vaccination status: Unknown-post COVID infection 09/06/20   HPI: 55 year old female past medical history of diabetes mellitus type 2, bipolar disorder type I, previous COVID infection, polysubstance abuse that includes cocaine.  She presented from home where she lives with her parents.  Her presenting symptoms included generalized weakness, multiple falls and confusion.  Family confirmed that she had been having the symptoms for at least a month with last use of crack cocaine 1 month prior to presentation.  Ever since that time she had not been doing well.  She would also be having issues with hyperglycemia.  In the ER she was diagnosed with severe dehydration, hyperglycemia and metabolic encephalopathy.  Since admission patient has been evaluated by the psychiatric team who has deemed her alert and oriented and having capacity to make decisions.  Patient is agreeable to  placement at a rehab.  Currently she has severe physical deconditioning which was POA and PT is recommending SNF.  Because of her history of polysubstance abuse been difficult to obtain an SNF bed.  Initially her family was agreeable to taking her back in home when she is able to ambulate well enough to get back and forth for meals into the bathroom.  Unfortunately her cognitive and physical status has progressively declined.  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  Palliative medicine assisted but have signed off. Patient's previous advanced directives stated she did not want a PEG tube if she were unable to eat. Unfortunately no improvement with short term enteral feeds. Cortrak dc'd. Famliy not ready for comfort measures.   Beginning on 8/2 patient had improved mentation.  Likely 2/2 recent addition of Zyprexa  Subjective: Alert and sitting up in bed eating breakfast.  Briskly answered orientation questions this morning.  Objective: Vitals:   04/23/21 1248 04/23/21 2106  BP: 120/83 138/63  Pulse: 69   Resp: 18 20  Temp: 98.6 F (37 C) 98.2 F (36.8 C)  SpO2: 99% 100%   No intake or output data in the 24 hours ending 04/24/21 0757   Filed Weights   04/19/21 0232 04/20/21 0500 04/21/21 0600  Weight: 68.5 kg 70.4 kg 70 kg   Constitutional: Awake and alert Respiratory: Lungs are clear, stable on room air.  No wheezing or increased work of breathing Abdomen: LBM 8/15, nontender with normoactive bowel sounds.  Oral intake remains marginal.  Oral intake  has improved and she is eating between 50 and 90% of her meals at this time noting she does best when fed Neurologic: Cranial nerves are grossly intact.  More drowsy today but patient has awareness of that.  No focal neurological deficits.  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   Thorough evaluation revealed symptoms not related to NPH MRI of her spine without any abnormalities to explain her progressive lower extremity weakness. Neurology suspects chronic progressive neurological decline 2/2 longstanding drug abuse. 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  Repeat EEG and MRI unchanged from prior studies Improvement in mentation or addition of Zyprexa  Atypical chest pain Onset yesterday afternoon and was constant in nature EKG and troponins unremarkable Suspect secondary to COPD exacerbation  COPD exacerbation Chest x-ray unremarkable-no leukocytosis Continue prednisone taper Continue scheduled DuoNebs for now Currently stable on room air  Dysphagia/failure to thrive secondary to multifactorial neurological condition 7/27 PMT added low-dose Zyprexa as appetite stimulant and noted w/ improvement in mentation and somewhat in appetite  Profound physical deconditioning/critical illness myopathy Likely a combination of persistent altered mental status and inability to participate consistently with PT as well as myopathy of critical illness in association with long COVID neurological syndrome PT and OT documented on 8/9 patient was able to roll in the bed with moderate assist and supine to sit with light moderate assist.  She was able to sit up in a chair for 15 minutes was able to progress to minimal guard minimum assist level.  She was also able to stand and pivot to the chair with moderate assist x2   Diabetes mellitus 2 with hyperglycemia on insulin Preadmission hemoglobin A1c greater  than 11 CBGs improved after adjustments in long-acting insulin.   Continue Semglee to 26 units a.m. and 14 units p.m. but cautiously monitor CBG readings as steroids are tapered down-anticipate likely will need to decrease dosages Continue 3 units meal coverage Continue SSI but increased to resistant while on steroids  History of bipolar 1 disorder/polysubstance abuse/cognitive impairment Home escitalopram, Depakote and Neurontin discontinued Given improvement in mental status will continue Zyprexa 5 mg to treat her bipolar disorder.   Elevated blood pressure/New dx HTN/murmur No prior documented history of hypertension  Continue low-dose Norvasc 8/2 Echocardiogram this admission revealed no valvular abnormalities but was c/w diastolic dysfunction; murmur has resolved was likely 2/2 hyperdynamic state  Suspected constipation Given 1 dose of milk of magnesia w/ appropriate results  Ventriculomegaly/? NPH NPH ruled out as above and is likely secondary to atrophy and generalized volume loss  Dyslipidemia Hold statin until oral intake improves  Physical deconditioning/bilateral heel cord shortening Continue bilateral PRAFO boots on every 4 hours and off every 4 hours Given progressive neurological decline unable to participate with therapies    Other problems: GERD Oral PPI on hold while n.p.o.  Hypokalemia/hypomagnesemia Resolved  E. Coli [name] UTI Treated Use purewick only at bedtime    Data Reviewed: Basic Metabolic Panel: Recent Labs  Lab 04/21/21 1448  NA 136  K 3.9  CL 100  CO2 28  GLUCOSE 227*  BUN 19  CREATININE 0.89  CALCIUM 9.5    Liver Function Tests: Recent  Labs  Lab 04/21/21 1448  AST 14*  ALT 12  ALKPHOS 64  BILITOT 0.3  PROT 6.4*  ALBUMIN 3.0*      No results for input(s): LIPASE, AMYLASE in the last 168 hours. No results for input(s): AMMONIA in the last 168 hours. CBC: Recent Labs  Lab 04/21/21 1448  WBC 9.5  NEUTROABS  4.8  HGB 13.5  HCT 40.5  MCV 89.4  PLT 352     Cardiac Enzymes: No results for input(s): CKTOTAL, CKMB, CKMBINDEX, TROPONINI in the last 168 hours. BNP (last 3 results) Recent Labs    03/24/21 0856 03/25/21 0203 03/26/21 0222  BNP 29.2 33.9 37.9    ProBNP (last 3 results) No results for input(s): PROBNP in the last 8760 hours.  CBG: Recent Labs  Lab 04/22/21 1954 04/23/21 0750 04/23/21 1246 04/23/21 1700 04/23/21 2029  GLUCAP 344* 376* 351* 121* 235*    No results found for this or any previous visit (from the past 240 hour(s)).     Studies: No results found.  Scheduled Meds:  (feeding supplement) PROSource Plus  30 mL Oral TID BM   amLODipine  5 mg Oral Daily   docusate sodium  100 mg Oral BID   enoxaparin (LOVENOX) injection  40 mg Subcutaneous Q24H   feeding supplement (GLUCERNA SHAKE)  237 mL Oral TID BM   insulin aspart  0-20 Units Subcutaneous TID WC   insulin aspart  0-5 Units Subcutaneous QHS   insulin aspart  3 Units Subcutaneous TID WC   insulin glargine-yfgn  26 Units Subcutaneous Daily   And   insulin glargine-yfgn  14 Units Subcutaneous QHS   metoprolol tartrate  50 mg Oral BID   multivitamin  15 mL Oral Daily   OLANZapine zydis  5 mg Oral QHS   polyethylene glycol  17 g Oral Daily   predniSONE  10 mg Oral 4X daily taper   senna  1 tablet Oral BID   Continuous Infusions:      Principal Problem:   Bipolar 1 disorder, depressed, full remission (HCC) Active Problems:   Bipolar 1 disorder (HCC)   Hypertension   Generalized weakness   Controlled type 2 diabetes mellitus with hyperglycemia, with long-term current use of insulin (HCC)   Hyperlipidemia   NPH (normal pressure hydrocephalus) (HCC)   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   Protein-calorie malnutrition, severe   Consultants: Psychiatry Neurology Neurosurgery  Procedures: None  Antibiotics: Anti-infectives (From admission, onward)    Start     Dose/Rate Route  Frequency Ordered Stop   03/06/21 0900  ceFAZolin (ANCEF) IVPB 1 g/50 mL premix        1 g 100 mL/hr over 30 Minutes Intravenous Every 8 hours 03/06/21 0801 03/10/21 0559   03/04/21 1430  cefTRIAXone (ROCEPHIN) 1 g in sodium chloride 0.9 % 100 mL IVPB  Status:  Discontinued        1 g 200 mL/hr over 30 Minutes Intravenous Every 24 hours 03/04/21 1335 03/06/21 0800   02/14/21 0828  vancomycin (VANCOREADY) IVPB 1000 mg/200 mL  Status:  Discontinued        1,000 mg 200 mL/hr over 60 Minutes Intravenous 60 min pre-op 02/14/21 0828 03/02/21 1139   02/13/21 2115  cefTRIAXone (ROCEPHIN) 1 g in sodium chloride 0.9 % 100 mL IVPB  Status:  Discontinued        1 g 200 mL/hr over 30 Minutes Intravenous Every 24 hours 02/13/21 2016 02/16/21 0844  Time spent: 15 minutes    [name] ANP  [facility] 7 am - 330 pm/M-F for direct patient care and secure chat Please refer to Amion for contact info 102  days

## 2021-04-24 NOTE — TOC Progression Note (Signed)
Transition of Care [facility]) - Progression Note    Patient Details  Name: Carolyn Norton MRN: [id number] Date of Birth: 1966/05/14  [facility]) CM/SW Contact  Janae Bridgeman, RN Phone Number: 04/24/2021, 10:18 AM  Clinical Narrative:    Case management spoke with [name], CM at [facility], and asked her to review the patient's clinicals for possible placement under LOG.  The patient's clinicals were re-faxed in the hub for the facility to review.  CM and MSW will continue to follow the patient for [facility] placement.   Expected Discharge Plan: Hospice Medical Facility Barriers to Discharge: Inadequate or no insurance, Family Issues  Expected Discharge Plan and Services Expected Discharge Plan: Hospice Medical Facility In-house Referral: Clinical Social Work Discharge Planning Services: CM Consult Post Acute Care Choice: Skilled Nursing Facility Living arrangements for the past 2 months: Single Family Home Expected Discharge Date: 01/16/21               DME Arranged: [name] rolling DME Agency: [employer] Date DME Agency Contacted: 01/20/21 Time DME Agency Contacted: 1435 Representative spoke with at DME Agency: [name]             Social Determinants of Health (SDOH) Interventions    Readmission Risk Interventions Readmission Risk Prevention Plan 01/14/2021  Transportation Screening Complete  PCP or Specialist Appt within 3-5 Days (No Data)  Social Work Consult for Recovery Care Planning/Counseling Complete  Palliative Care Screening Not Applicable  Medication Review [employer]) Referral to Pharmacy  Some recent data might be hidden

## 2021-04-24 NOTE — Plan of Care (Signed)

## 2021-04-24 NOTE — Progress Notes (Signed)
Occupational Therapy Treatment Patient Details Name: Carolyn Norton MRN: [id number] DOB: [birth year] Today's Date: 04/24/2021    History of present illness Pt is a 55 y.o. female initially admitted nearly 3 months ago on 01/11/21 with multiple falls, AMS, weakness. Workup for severe dehydration, DKA and metabolic encephalopathy. 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 7/1 underwent lumbar puncture and drained large volume of CSF to assess for potential improvement in symptomology (none noted);  7/2 MRI of the brain, cervical, thoracic and lumbar spine with no acute changes to explain LE weakness. PT signed off 7/12 secondary to lack of progress and further cognitive/functional decline. Cortrak placed 7/15 secondary to poor [email or web address]. Family declining comfort measures. On 8/2 patient with increased alertness per MD after beginning medication for anorexia and PT/OT reiniated. PMH includes DM2, COPD, CKD3, GERD, bipolar disorder, COVID-19, polysubstance abuse including cocaine.   OT comments  Patient making great progress toward goals. Following 1-step verbal commands with increased accuracy and able to sequence self-car tasks with fewer cues. Patient progressed from EOB to [facility] with Mod A +2 via stand-pivot with heavy posterior bias and Max cues for anterior weight shift. May benefit from exercise in prone and quadruped. Patient emptied bowels and bladder in [facility] requiring Mod A +2 for hygiene/clothing management. Session concluded with patient seated at nurses station for increased social interaction. OT will continue to follow acutely.    Follow Up Recommendations  Other (comment);SNF (Long-term/custodial care)    Equipment Recommendations  Other (comment) (TBD)    Recommendations for Other Services      Precautions / Restrictions Precautions Precautions: Fall Precaution Comments: High fall risk       Mobility Bed Mobility Overal bed mobility: Needs  Assistance Bed Mobility: Rolling Rolling: Mod assist Sidelying to sit: Min assist;Mod assist;HOB elevated       General bed mobility comments: Min A rolling L and Min to Mod A to elevate trunk to sit at EOB. Cues for sequencing.    Transfers Overall transfer level: Needs assistance   Transfers: Sit to/from Stand Sit to Stand: Mod assist;+2 physical assistance;+2 safety/equipment;Max assist Stand pivot transfers: Mod assist;Max assist;+2 physical assistance;+2 safety/equipment       General transfer comment: Mod A +2 initially to stand but requries Max A +2 for anterior weight shift. Max A +2 for modified stand-pivot to recliner on L. Patient with continued strong posterior bias. L foot slides along ground.    Balance Overall balance assessment: Needs assistance Sitting-balance support: Bilateral upper extremity supported;Feet supported Sitting balance-Leahy Scale: Poor Sitting balance - Comments: Requires Mod A initially to maintain static sitting balance at EOB; progressed to Min guard with cues for anterior weight shift. Postural control: Posterior lean Standing balance support: During functional activity;Bilateral upper extremity supported Standing balance-Leahy Scale: Poor Standing balance comment: Requires BUE support and +2 assist. Strong posterior bias. Difficulty shifting weight anteriorly even with max multimodal cues.                           ADL either performed or assessed with clinical judgement   ADL Overall ADL's : Needs assistance/impaired Eating/Feeding: Sitting;Supervision/ safety       Upper Body Bathing: Moderate assistance;Sitting Upper Body Bathing Details (indicate cue type and reason): Mod A with Mod cues for sequencing. Lower Body Bathing: Moderate assistance;Sitting/lateral leans Lower Body Bathing Details (indicate cue type and reason): Mod A in [place]. Patient able to wash bilateral thighs and  front perineal area. Assist to wash  buttocks. Upper Body Dressing : Minimal assistance;Sitting Upper Body Dressing Details (indicate cue type and reason): Able to doff soiled gown. Min A to don fresh gown in perched position in [place]. Lower Body Dressing: Moderate assistance;Bed level Lower Body Dressing Details (indicate cue type and reason): Figure-4 position in long sitting; forward chaining with patient able to pull bilateral socks over heels. Less cues required to sequence task this date. Patient donned R sock with 0 cues but required 2 cues to don L sock. Toilet Transfer: Moderate assistance;+2 for physical assistance;+2 for safety/equipment Toilet Transfer Details (indicate cue type and reason): Stand-pivot to [facility] with max cues for squencing and anterior weight shift. Toileting- Clothing Manipulation and Hygiene: Moderate assistance;+2 for physical assistance;+2 for safety/equipment;Sit to/from stand Toileting - Clothing Manipulation Details (indicate cue type and reason): Use of [place] for hygiene/clothing management. Patient able to pull to standing with Mod A to boost. Significant assist required for anterior weight shift.       General ADL Comments: Making great progres toward goals.     Vision       Perception     Praxis      Cognition Arousal/Alertness: Awake/alert Behavior During Therapy: WFL for tasks assessed/performed Overall Cognitive Status: Impaired/Different from baseline                   Orientation Level: Disoriented to;Time;Situation Current Attention Level: Sustained Memory: Decreased short-term memory;Decreased recall of precautions Following Commands: Follows one step commands with increased time Safety/Judgement: Decreased awareness of deficits;Decreased awareness of safety Awareness: Intellectual Problem Solving: Slow processing;Decreased initiation;Difficulty sequencing;Requires verbal cues;Requires tactile cues General Comments: Follows 1-step verbal commands wtih 85% accuracy  this date; smiling/pleasant; able to recall 1/3 words on BIMs        Exercises     Shoulder Instructions       General Comments      Pertinent Vitals/ Pain       Pain Assessment: Faces Faces Pain Scale: Hurts a little bit Pain Location: chest Pain Descriptors / Indicators: Sore  Home Living                                          Prior Functioning/Environment              Frequency  Min 2X/week        Progress Toward Goals  OT Goals(current goals can now be found in the care plan section)  Progress towards OT goals: Progressing toward goals  Acute Rehab OT Goals Patient Stated Goal: To return home with her mother/father. OT Goal Formulation: Patient unable to participate in goal setting Time For Goal Achievement: 05/07/21 Potential to Achieve Goals: Fair ADL Goals Pt Will Perform Eating: with set-up;sitting Pt Will Perform Grooming: with set-up;sitting Pt Will Perform Upper Body Dressing: with min assist;sitting Pt Will Perform Lower Body Dressing: with mod assist;sit to/from stand Pt Will Transfer to Toilet: with mod assist;bedside commode Pt Will Perform Toileting - Clothing Manipulation and hygiene: with mod assist;sit to/from stand;sitting/lateral leans Pt/caregiver will Perform Home Exercise Program: Increased strength;Both right and left upper extremity;With theraband;With written HEP provided Additional ADL Goal #1: Patietn will follow 1-step verbal commands with 95% accuracy in prep for ADLs at bed level vs. in sitting. Additional ADL Goal #2: Patient will follow 1-step verbal commands with 50% accuracy in prep for  ADLs. Additional ADL Goal #3: Patient will complete 3/3 grooming tasks in supported sitting position with Mod A and Max multimodal cues.  Plan Discharge plan remains appropriate;Frequency needs to be updated    Co-evaluation    PT/OT/SLP Co-Evaluation/Treatment: Yes            AM-PAC OT "6 Clicks" Daily Activity      Outcome Measure   Help from another person eating meals?: A Little Help from another person taking care of personal grooming?: A Little Help from another person toileting, which includes using toliet, bedpan, or urinal?: Total Help from another person bathing (including washing, rinsing, drying)?: Total Help from another person to put on and taking off regular upper body clothing?: A Lot Help from another person to put on and taking off regular lower body clothing?: A Lot 6 Click Score: 12    End of Session Equipment Utilized During Treatment: Gait belt  OT Visit Diagnosis: Unsteadiness on feet (R26.81);Other abnormalities of gait and mobility (R26.89);Muscle weakness (generalized) (M62.81);Other symptoms and signs involving cognitive function;Other symptoms and signs involving the nervous system (R29.898)   Activity Tolerance Other (comment) (Poor participation this a.m.)   Patient Left with bed alarm set;in chair;Other (comment) (Seated in recliner at nrs station)   Nurse Communication Mobility status;Other (comment) (Patient with poor participation this a.m.)        Time: 5364-6803 OT Time Calculation (min): 42 min  Charges: OT General Charges $OT Visit: 1 Visit OT Treatments $Self Care/Home Management : 23-37 mins $Therapeutic Activity: 8-22 mins  Calixto Pavel H. OTR/L Supplemental OT, Department of rehab services [phone]   Jakara Blatter R H. 04/24/2021, 2:54 PM

## 2021-04-25 LAB — GLUCOSE, CAPILLARY
Glucose-Capillary: 283 mg/dL — ABNORMAL HIGH (ref 70–99)
Glucose-Capillary: 198 mg/dL — ABNORMAL HIGH (ref 70–99)
Glucose-Capillary: 317 mg/dL — ABNORMAL HIGH (ref 70–99)
Glucose-Capillary: 168 mg/dL — ABNORMAL HIGH (ref 70–99)

## 2021-04-25 MED ORDER — HALOPERIDOL LACTATE 5 MG/ML IJ SOLN: 5.0000 mg | Freq: Once | INTRAMUSCULAR | Status: DC

## 2021-04-25 MED ORDER — HALOPERIDOL LACTATE 5 MG/ML IJ SOLN
5.0000 mg | Freq: Once | INTRAMUSCULAR | Status: AC
  Administered 2021-04-25: 5 mg via INTRAMUSCULAR
  Filled 2021-04-25: qty 1

## 2021-04-25 NOTE — Progress Notes (Signed)
[facility] PROGRESS NOTE  Carolyn Norton YWV:[id number] DOB: 1966-03-11 DOA: 01/11/2021 PCP: [name], FNP            March 27, 2021     Status: Remains inpatient appropriate because:Unsafe d/c plan-due to patient's underlying poor mental status short-term memory deficits she will require 24/7 care after discharge  Dispo:  Patient From:  Home  Planned Disposition: SNF w/ Hospice follow up at facility  Medically stable for discharge:  yes  Barriers to DC: Medicaid still pending; prior history of substance abuse and underlying documented bipolar disorder             Difficult to place: Yes  Level of care: Med-Surg  Code Status: DNR Family Communication: Father 8/03.  Updated on patient's improved mental status and suspicions that the low-dose Zyprexa that was initiated for anorexia has actually improved her mental status.  Explained that we are not certain what is the cause of her changes but clearly this medication appears to be improving her overall alertness. DVT prophylaxis: Lovenox COVID vaccination status: Unknown-post COVID infection 09/06/20   HPI: 55 year old female past medical history of diabetes mellitus type 2, bipolar disorder type I, previous COVID infection, polysubstance abuse that includes cocaine.  She presented from home where she lives with her parents.  Her presenting symptoms included generalized weakness, multiple falls and confusion.  Family confirmed that she had been having the symptoms for at least a month with last use of crack cocaine 1 month prior to presentation.  Ever since that time she had not been doing well.  She would also be having issues with hyperglycemia.  In the ER she was diagnosed with severe dehydration, hyperglycemia and metabolic encephalopathy.  Since admission patient has been evaluated by the psychiatric team who has deemed her alert and oriented and having capacity to make decisions.  Patient is agreeable to  placement at a rehab.  Currently she has severe physical deconditioning which was POA and PT is recommending SNF.  Because of her history of polysubstance abuse been difficult to obtain an SNF bed.  Initially her family was agreeable to taking her back in home when she is able to ambulate well enough to get back and forth for meals into the bathroom.  Unfortunately her cognitive and physical status has progressively declined.  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  Palliative medicine assisted but have signed off. Patient's previous advanced directives stated she did not want a PEG tube if she were unable to eat. Unfortunately no improvement with short term enteral feeds. Cortrak dc'd. Famliy not ready for comfort measures.   Beginning on 8/2 patient had improved mentation.  Likely 2/2 recent addition of Zyprexa.  As of 8/19 therapies document patient continuing to improve although still requires max assist for most activities and basic functions.  She also still requires assistance with meal prep and eats better if actually fed.  Subjective: Sitting up in bed sleeping.  Breakfast tray in front of her and has barely been touched.  Patient and she confirms she was more sleepy today after therapies yesterday.  This has been demonstrated as a regular pattern for her.  Objective: Vitals:   04/24/21 2115 04/25/21 0415  BP: 127/74 (!) 171/97  Pulse: 81 (!) 106  Resp: 18 18  Temp: 98.2 F (36.8 C) 98.2 F (36.8 C)  SpO2: 99% 97%    Intake/Output Summary (Last 24 hours)  at 04/25/2021 0739 Last data filed at 04/25/2021 0300 Gross per 24 hour  Intake 720 ml  Output 700 ml  Net 20 ml     Filed Weights   04/19/21 0232 04/20/21 0500 04/21/21 0600  Weight: 68.5 kg 70.4 kg 70 kg   Constitutional: Sleeping but awakens easily. Respiratory: Lungs remain clear to auscultation anteriorly, stable on room air with  normal pulse oximetry readings. Abdomen: LBM 8/16, nontender with normoactive bowel sounds.  Eats best when fed Neurologic: Cranial nerves are grossly intact.  More drowsy today but patient has awareness of that.  No focal neurological deficits.  Upper and lower extremity strength 3/5 Psychiatric: Awake.  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   Thorough evaluation revealed symptoms not related to NPH MRI of her spine without any abnormalities to explain her progressive lower extremity weakness. Neurology suspects chronic progressive neurological decline 2/2 longstanding drug abuse. 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  Repeat EEG and MRI unchanged from prior studies Improvement in mentation after addition of Zyprexa  COPD exacerbation Chest x-ray unremarkable-no leukocytosis Nearing completion of prednisone taper Continue prn DuoNebs  Currently stable on room air  Dysphagia/failure to thrive secondary to multifactorial neurological condition 7/27 PMT added low-dose Zyprexa as appetite stimulant and noted w/ improvement in mentation and somewhat in appetite  Profound physical deconditioning/critical illness myopathy Likely a combination of persistent altered mental status and inability to participate consistently with PT as well as 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   Continue Semglee to 26 units a.m. and 14 units p.m. but cautiously monitor CBG readings as steroids are tapered down-anticipate likely will need to decrease dosages Continue 3 units meal coverage Continue SSI but increased to resistant while on steroids  History of bipolar 1 disorder/polysubstance abuse/cognitive impairment Home escitalopram, Depakote and Neurontin discontinued Given improvement in mental status will continue Zyprexa 5 mg to treat her bipolar disorder.   Elevated blood pressure/New dx HTN/murmur No prior documented history of hypertension  Continue low-dose Norvasc 8/2 Echocardiogram this admission revealed no valvular abnormalities but was c/w diastolic dysfunction; murmur has resolved was likely 2/2 hyperdynamic state  Suspected constipation Given 1 dose of milk of magnesia w/ appropriate results  Ventriculomegaly/? NPH NPH ruled out as above and is likely secondary to atrophy and generalized volume loss  Dyslipidemia Hold statin until oral intake improves  Physical deconditioning/bilateral heel cord shortening Continue bilateral PRAFO boots on every 4 hours and off every 4 hours Given progressive neurological decline unable to participate with therapies    Other problems: GERD Oral PPI on hold while n.p.o.  Hypokalemia/hypomagnesemia Resolved  E. Coli [name]/Klebsiella UTI Treated Use purewick only at bedtime    Data Reviewed: Basic Metabolic Panel: Recent Labs  Lab 04/21/21 1448  NA 136  K 3.9  CL 100  CO2 28  GLUCOSE 227*  BUN 19  CREATININE 0.89  CALCIUM 9.5    Liver Function Tests: Recent  Labs  Lab 04/21/21 1448  AST 14*  ALT 12  ALKPHOS 64  BILITOT 0.3  PROT 6.4*  ALBUMIN 3.0*      No results for input(s): LIPASE, AMYLASE in the last 168 hours. No results for input(s): AMMONIA in the last 168  hours. CBC: Recent Labs  Lab 04/21/21 1448  WBC 9.5  NEUTROABS 4.8  HGB 13.5  HCT 40.5  MCV 89.4  PLT 352     Cardiac Enzymes: No results for input(s): CKTOTAL, CKMB, CKMBINDEX, TROPONINI in the last 168 hours. BNP (last 3 results) Recent Labs    03/24/21 0856 03/25/21 0203 03/26/21 0222  BNP 29.2 33.9 37.9    ProBNP (last 3 results) No results for input(s): PROBNP in the last 8760 hours.  CBG: Recent Labs  Lab 04/23/21 2029 04/24/21 0803 04/24/21 1128 04/24/21 1631 04/24/21 1955  GLUCAP 235* 252* 378* 97 259*    No results found for this or any previous visit (from the past 240 hour(s)).     Studies: No results found.  Scheduled Meds:  (feeding supplement) PROSource Plus  30 mL Oral TID BM   amLODipine  5 mg Oral Daily   docusate sodium  100 mg Oral BID   enoxaparin (LOVENOX) injection  40 mg Subcutaneous Q24H   feeding supplement (GLUCERNA SHAKE)  237 mL Oral TID BM   insulin aspart  0-20 Units Subcutaneous TID WC   insulin aspart  0-5 Units Subcutaneous QHS   insulin aspart  3 Units Subcutaneous TID WC   insulin glargine-yfgn  26 Units Subcutaneous Daily   And   insulin glargine-yfgn  14 Units Subcutaneous QHS   metoprolol tartrate  50 mg Oral BID   multivitamin  15 mL Oral Daily   OLANZapine zydis  5 mg Oral QHS   polyethylene glycol  17 g Oral Daily   predniSONE  10 mg Oral 4X daily taper   senna  1 tablet Oral BID   Continuous Infusions:      Principal Problem:   Bipolar 1 disorder, depressed, full remission (HCC) Active Problems:   Bipolar 1 disorder (HCC)   Hypertension   Generalized weakness   Controlled type 2 diabetes mellitus with hyperglycemia, with long-term current use of insulin (HCC)   Hyperlipidemia   NPH (normal pressure hydrocephalus) (HCC)   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   Protein-calorie malnutrition,  severe   Consultants: Psychiatry Neurology Neurosurgery  Procedures: None  Antibiotics: Anti-infectives (From admission, onward)    Start     Dose/Rate Route Frequency Ordered Stop   03/06/21 0900  ceFAZolin (ANCEF) IVPB 1 g/50 mL premix        1 g 100 mL/hr over 30 Minutes Intravenous Every 8 hours 03/06/21 0801 03/10/21 0559   03/04/21 1430  cefTRIAXone (ROCEPHIN) 1 g in sodium chloride 0.9 % 100 mL IVPB  Status:  Discontinued        1 g 200 mL/hr over 30 Minutes Intravenous Every 24 hours 03/04/21 1335 03/06/21 0800   02/14/21 0828  vancomycin (VANCOREADY) IVPB 1000 mg/200 mL  Status:  Discontinued        1,000 mg 200 mL/hr over 60 Minutes Intravenous 60 min pre-op 02/14/21 0828 03/02/21 1139   02/13/21 2115  cefTRIAXone (ROCEPHIN) 1 g in sodium chloride 0.9 % 100 mL IVPB  Status:  Discontinued        1 g 200 mL/hr over 30 Minutes Intravenous Every 24 hours 02/13/21 2016 02/16/21 0844  Time spent: 15 minutes    [name] ANP  [facility] 7 am - 330 pm/M-F for direct patient care and secure chat Please refer to Amion for contact info 103  days

## 2021-04-25 NOTE — Plan of Care (Signed)
  Problem: Clinical Measurements: Goal: Ability to maintain clinical measurements within normal limits will improve 04/25/2021 1358 by Margarita Grizzle, RN Outcome: Progressing 04/25/2021 1358 by Margarita Grizzle, RN Outcome: Progressing Goal: Will remain free from infection 04/25/2021 1358 by Margarita Grizzle, RN Outcome: Progressing 04/25/2021 1358 by Margarita Grizzle, RN Outcome: Progressing Goal: Diagnostic test results will improve 04/25/2021 1358 by Margarita Grizzle, RN Outcome: Progressing 04/25/2021 1358 by Margarita Grizzle, RN Outcome: Progressing Goal: Respiratory complications will improve 04/25/2021 1358 by Margarita Grizzle, RN Outcome: Progressing 04/25/2021 1358 by Margarita Grizzle, RN Outcome: Progressing Goal: Cardiovascular complication will be avoided 04/25/2021 1358 by Margarita Grizzle, RN Outcome: Progressing 04/25/2021 1358 by Margarita Grizzle, RN Outcome: Progressing   Problem: Activity: Goal: Risk for activity intolerance will decrease 04/25/2021 1358 by Margarita Grizzle, RN Outcome: Progressing 04/25/2021 1358 by Margarita Grizzle, RN Outcome: Progressing   Problem: Coping: Goal: Level of anxiety will decrease 04/25/2021 1358 by Margarita Grizzle, RN Outcome: Progressing 04/25/2021 1358 by Margarita Grizzle, RN Outcome: Progressing   Problem: Elimination: Goal: Will not experience complications related to bowel motility 04/25/2021 1358 by Margarita Grizzle, RN Outcome: Progressing 04/25/2021 1358 by Margarita Grizzle, RN Outcome: Progressing Goal: Will not experience complications related to urinary retention 04/25/2021 1358 by Margarita Grizzle, RN Outcome: Progressing 04/25/2021 1358 by Margarita Grizzle, RN Outcome: Progressing   Problem: Pain Managment: Goal: General experience of comfort will improve 04/25/2021 1358 by Margarita Grizzle, RN Outcome: Progressing 04/25/2021 1358 by Margarita Grizzle, RN Outcome: Progressing   Problem: Safety: Goal: Ability to remain free from  injury will improve 04/25/2021 1358 by Margarita Grizzle, RN Outcome: Progressing 04/25/2021 1358 by Margarita Grizzle, RN Outcome: Progressing   Problem: Skin Integrity: Goal: Risk for impaired skin integrity will decrease 04/25/2021 1358 by Margarita Grizzle, RN Outcome: Progressing 04/25/2021 1358 by Margarita Grizzle, RN Outcome: Progressing   Problem: Education: Goal: Knowledge of General Education information will improve Description: Including pain rating scale, medication(s)/side effects and non-pharmacologic comfort measures 04/25/2021 1358 by Margarita Grizzle, RN Outcome: Not Progressing 04/25/2021 1358 by Margarita Grizzle, RN Outcome: Progressing   Problem: Health Behavior/Discharge Planning: Goal: Ability to manage health-related needs will improve 04/25/2021 1358 by Margarita Grizzle, RN Outcome: Not Progressing 04/25/2021 1358 by Margarita Grizzle, RN Outcome: Progressing   Problem: Nutrition: Goal: Adequate nutrition will be maintained 04/25/2021 1358 by Margarita Grizzle, RN Outcome: Not Progressing 04/25/2021 1358 by Margarita Grizzle, RN Outcome: Progressing

## 2021-04-25 NOTE — Progress Notes (Addendum)
[facility] night shift MedSurg coverage note.  The nursing staff reported that the patient has been very restless, has pulled out her IV and is interfering with treatment while increasing her risk of fall/injury.  Waist belt restraints and after reviewing recent EKG haloperidol 5 mg IM x1 dose ordered.  Sanda Klein, MD

## 2021-04-25 NOTE — Progress Notes (Signed)
Physical Therapy Treatment Patient Details Name: Carolyn Norton MRN: [id number] DOB: 1965/10/24 Today's Date: 04/25/2021    History of Present Illness Pt is a 55 y.o. female initially admitted nearly 3 months ago on 01/11/21 with multiple falls, AMS, weakness. Workup for severe dehydration, DKA and metabolic encephalopathy. 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 7/1 underwent lumbar puncture and drained large volume of CSF to assess for potential improvement in symptomology (none noted);  7/2 MRI of the brain, cervical, thoracic and lumbar spine with no acute changes to explain LE weakness. PT signed off 7/12 secondary to lack of progress and further cognitive/functional decline. Cortrak placed 7/15 secondary to poor [email or web address]. Family declining comfort measures. On 8/2 patient with increased alertness per MD after beginning medication for anorexia and PT/OT reiniated. PMH includes DM2, COPD, CKD3, GERD, bipolar disorder, COVID-19, polysubstance abuse including cocaine.    PT Comments    Pt admitted with above diagnosis. Pt was able to stand to [facility] but refused to work on weight shifting.  Moved pt to chair and pt stood again and wanted to just get into the chair.  Limited treatment by patient today.  Pt currently with functional limitations due to balance and endruance deficits. Pt will benefit from skilled PT to increase their independence and safety with mobility to allow discharge to the venue listed below.      Follow Up Recommendations  SNF     Equipment Recommendations  Hospital bed;Wheelchair (measurements PT);Other (comment);Wheelchair cushion (measurements PT) Water quality scientist)    Recommendations for Other Services OT consult     Precautions / Restrictions Precautions Precautions: Fall Precaution Comments: High fall risk Restrictions Weight Bearing Restrictions: No    Mobility  Bed Mobility Overal bed mobility: Needs Assistance Bed Mobility:  Rolling Rolling: Mod assist Sidelying to sit: Mod assist;HOB elevated;+2 for physical assistance Supine to sit: Mod assist     General bed mobility comments: Total asisst to don mesh panties. Mod A to elevate trunk to sit at EOB as well as to initiate LES as pt not really wanting to get up intiially. Cues for sequencing.    Transfers Overall transfer level: Needs assistance   Transfers: Sit to/from Stand Sit to Stand: Mod assist;+2 physical assistance;Min assist Stand pivot transfers: +2 safety/equipment;Total assist       General transfer comment: Mod A +2 initially to stand but requries min assist once in [place] to stand.  did not need +2 for anterior weight shift with use of Stedy as her hands were on bar which seemed to help her lean forward. Asked pt to bring her tummy to the bar to try and faciliate anterior weight shift and pt stated, "No.". Asked pt is she wanted to transfer to chair and she said "Yes".  Moved pt in [place] to chair and pt stood wtih min assist to get into chair. Total assist to scoot her back in recliner.  Ambulation/Gait                 Stairs             Wheelchair Mobility    Modified Rankin (Stroke Patients Only)       Balance Overall balance assessment: Needs assistance Sitting-balance support: Bilateral upper extremity supported;Feet supported Sitting balance-Leahy Scale: Poor Sitting balance - Comments: Requires Mod A initially to maintain static sitting balance at EOB; progressed to Min guard with cues for anterior weight shift. Postural control: Posterior lean Standing balance support: During functional activity;Bilateral  upper extremity supported Standing balance-Leahy Scale: Poor Standing balance comment: Requires BUE support and +2 assist. Strong posterior bias. Difficulty shifting weight anteriorly even with max multimodal cues.                            Cognition Arousal/Alertness: Lethargic Behavior During  Therapy: WFL for tasks assessed/performed Overall Cognitive Status: Impaired/Different from baseline Area of Impairment: Attention;Following commands;Problem solving                 Orientation Level: Disoriented to;Time;Situation Current Attention Level: Sustained Memory: Decreased short-term memory;Decreased recall of precautions Following Commands: Follows one step commands with increased time Safety/Judgement: Decreased awareness of deficits;Decreased awareness of safety Awareness: Intellectual Problem Solving: Slow processing;Decreased initiation;Difficulty sequencing;Requires verbal cues;Requires tactile cues General Comments: Follows 1-step verbal commands wtih 85% accuracy this date; pt with flat affect today - not as talkative and not smiling      Exercises General Exercises - Lower Extremity Long Arc Quad: AAROM;5 reps;Both;Supine Hip Flexion/Marching: Both;10 reps;Seated;AAROM    General Comments        Pertinent Vitals/Pain Pain Assessment: No/denies pain    Home Living                      Prior Function            PT Goals (current goals can now be found in the care plan section) Acute Rehab PT Goals Patient Stated Goal: To return home with her mother/father. Progress towards PT goals: Progressing toward goals    Frequency    Min 2X/week      PT Plan Current plan remains appropriate    Co-evaluation              AM-PAC PT "6 Clicks" Mobility   Outcome Measure  Help needed turning from your back to your side while in a flat bed without using bedrails?: A Lot Help needed moving from lying on your back to sitting on the side of a flat bed without using bedrails?: A Lot Help needed moving to and from a bed to a chair (including a wheelchair)?: Total Help needed standing up from a chair using your arms (e.g., wheelchair or bedside chair)?: Total Help needed to walk in hospital room?: Total Help needed climbing 3-5 steps with a  railing? : Total 6 Click Score: 8    End of Session Equipment Utilized During Treatment: Gait belt Activity Tolerance:  (self limiting somewhat today) Patient left: with chair alarm set;in chair;with call bell/phone within reach Nurse Communication: Mobility status;Need for lift equipment [place] Dec STEDY vs maxi move back to bed) PT Visit Diagnosis: Muscle weakness (generalized) (M62.81);Repeated falls (R29.6);Difficulty in walking, not elsewhere classified (R26.2);Unsteadiness on feet (R26.81)     Time: 1202-1218 PT Time Calculation (min) (ACUTE ONLY): 16 min  Charges:  $Therapeutic Activity: 8-22 mins                     Eleshia Wooley M,PT Acute Rehab Services [phone] (pager)    Bevelyn Buckles 04/25/2021, 4:15 PM

## 2021-04-25 NOTE — Plan of Care (Signed)

## 2021-04-25 NOTE — TOC Progression Note (Addendum)
Transition of Care [facility]) - Progression Note    Patient Details  Name: Carolyn Norton MRN: [id number] Date of Birth: November 11, 1965  Transition of Care [facility]) CM/SW Contact  Janae Bridgeman, RN Phone Number: 04/25/2021, 9:01 AM  Clinical Narrative:    Case management called and left a voice mail message with Essie Hart, CM at [facility] in [place], [place] and [place] at [place] to check on possible bed offers for this patient.  CM and MSW will continue to explore options for LTC placement for custodial care.  04/25/2021 1147 - I called and spoke with Melissa, CM at [facility] and she is having the DON at the facility review the patient's clinicals for possible bed offer.  Patient has pending Medicaid and disability at this time.   Expected Discharge Plan: Hospice Medical Facility Barriers to Discharge: Inadequate or no insurance, Family Issues  Expected Discharge Plan and Services Expected Discharge Plan: Hospice Medical Facility In-house Referral: Clinical Social Work Discharge Planning Services: CM Consult Post Acute Care Choice: Skilled Nursing Facility Living arrangements for the past 2 months: Single Family Home Expected Discharge Date: 01/16/21               DME Arranged: [name] rolling DME Agency: [employer] Date DME Agency Contacted: 01/20/21 Time DME Agency Contacted: 1435 Representative spoke with at DME Agency: [name]             Social Determinants of Health (SDOH) Interventions    Readmission Risk Interventions Readmission Risk Prevention Plan 01/14/2021  Transportation Screening Complete  PCP or Specialist Appt within 3-5 Days (No Data)  Social Work Consult for Recovery Care Planning/Counseling Complete  Palliative Care Screening Not Applicable  Medication Review Oceanographer) Referral to Pharmacy  Some recent data might be hidden

## 2021-04-26 LAB — GLUCOSE, CAPILLARY
Glucose-Capillary: 265 mg/dL — ABNORMAL HIGH (ref 70–99)
Glucose-Capillary: 390 mg/dL — ABNORMAL HIGH (ref 70–99)
Glucose-Capillary: 132 mg/dL — ABNORMAL HIGH (ref 70–99)
Glucose-Capillary: 163 mg/dL — ABNORMAL HIGH (ref 70–99)

## 2021-04-26 MED ORDER — INSULIN ASPART 100 UNIT/ML IJ SOLN
4.0000 [IU] | Freq: Three times a day (TID) | INTRAMUSCULAR | Status: DC
  Administered 2021-04-26 – 2021-04-30 (×10): 4 [IU] via SUBCUTANEOUS

## 2021-04-26 NOTE — Plan of Care (Signed)

## 2021-04-26 NOTE — Progress Notes (Signed)
PROGRESS NOTE  Carolyn Norton STM:[id number] DOB: 09-Feb-1966 DOA: 01/11/2021 PCP: [name], FNP   LOS: 104 days   Brief narrative:  55 year old female past medical history of diabetes mellitus type 2, bipolar disorder type I, previous COVID infection, polysubstance abuse that includes cocaine.  She presented from home where she lives with her parents.  Her presenting symptoms included generalized weakness, multiple falls and confusion.  Family confirmed that she had been having the symptoms for at least a month with last use of crack cocaine 1 month prior to presentation.  Ever since that time she had not been doing well.  She would also be having issues with hyperglycemia.  In the ER she was diagnosed with severe dehydration, hyperglycemia and metabolic encephalopathy.   Since admission patient has been evaluated by the psychiatric team who has deemed her alert and oriented and having capacity to make decisions.  Patient is agreeable to placement at a rehab.  Currently she has severe physical deconditioning which was POA and PT is recommending SNF.  Because of her history of polysubstance abuse been difficult to obtain an SNF bed.  Initially her family was agreeable to taking her back in home when she is able to ambulate well enough to get back and forth for meals into the bathroom.  Unfortunately her cognitive and physical status has progressively declined.  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  Palliative medicine assisted but have signed off. Patient's previous advanced directives stated she did not want a PEG tube if she were unable to eat. Unfortunately no improvement with short term enteral feeds. Cortrak dc'd. Famliy not ready for comfort measures.    Beginning on 8/2, patient had improved mentation.  Likely 2/2 recent addition of Zyprexa.  Patient is still requiring maximum assist for  activities of daily living.  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  History of longstanding drug use/history of chronic progressive neurological decline/long COVID syndrome.  EEG MRI unchanged from prior.  On Zyprexa.  Had episodes of agitation and required Haldol yesterday.    Dysphagia/failure to thrive secondary to multifactorial neurological condition  On regular consistency diet   Acute hypoxemic respiratory failure with underlying COPD with mild exacerbation. Currently stable on room air.  Saturating 98%.  No wheezing.  Currently on prednisone taper.   Diabetes mellitus 2 with hyperglycemia Prior to admission hemoglobin A1c greater than 11.  Continue long-acting insulin and mealtime insulin and sliding scale insulin.  Latest POC glucose of 390.  We will continue to monitor closely.  Currently on steroid.  Adjust insulin dose while on steroids  History of bipolar 1 disorder/polysubstance abuse/cognitive impairment Continue to hold home escitalopram, Depakote and Neurontin secondary to persistent altered mentation and bipolar symptoms not present. Currently on Zyprexa 5 mg and will continue.   Essential hypertension On low-dose Norvasc. 8/2 Echocardiogram this admission revealed no valvular abnormalities  Profound physical deconditioning/critical illness myopathy Physical therapy has  recommended skilled nursing facility placement.  Awaiting for placement.   Ventriculomegaly NPH has been ruled out.   Dyslipidemia Statin currently on hold   Physical deconditioning/bilateral heel cord shortening Continue bilateral PRAFO boots on every 4 hours and off every 4 hours.  Physical therapy on board.  Recommending skilled nursing facility placement.   Recent E. Coli [name] UTI Has completed course of treatment.    DVT prophylaxis: enoxaparin (LOVENOX) injection 40 mg Start: 03/24/21 1000 SCD's Start: 02/14/21 0829   Code Status: DNR  Family Communication: None  Status is: Inpatient  Remains inpatient appropriate because: Disposition issues  Dispo:  Patient From: Home  Planned Disposition: Skilled nursing facility as per physical therapy recommendation.  Medically stable for discharge: Yes  Barriers to discharge: Yes.  Medicaid still pending.  History of substance abuse and bipolar disorder.  Consultants: Psychiatry Neurology Neurosurgery  Procedures: None  Anti-infectives:  None  Subjective: Today, patient was seen and examined at bedside.  Denies any interval complaints.  Had been little agitated yesterday requiring some restraints.  Better this morning.  Did  need Haldol for sedation as well.  Objective: Vitals:   04/25/21 1037 04/25/21 2121  BP: 131/88 139/74  Pulse: 100 87  Resp: 20 18  Temp:  98 F (36.7 C)  SpO2: 100% 98%    Intake/Output Summary (Last 24 hours) at 04/26/2021 1301 Last data filed at 04/26/2021 1030 Gross per 24 hour  Intake 960 ml  Output 1800 ml  Net -840 ml    Filed Weights   04/19/21 0232 04/20/21 0500 04/21/21 0600  Weight: 68.5 kg 70.4 kg 70 kg   Body mass index is 24.91 kg/m.   Physical Exam:  General: Average built, alert awake and communicative, not in obvious distress. HENT:   No scleral pallor or icterus noted. Oral mucosa is moist.  Chest:  Clear breath sounds.  Diminished breath sounds  bilaterally. No crackles or wheezes.  CVS: S1 &S2 heard. No murmur.  Regular rate and rhythm. Abdomen: Soft, nontender, nondistended.  Bowel sounds are heard.   Extremities: No cyanosis, clubbing or edema.  Peripheral pulses are palpable. Psych: Awake and communicative, confused CNS:  No cranial nerve deficits.  Moves all extremities, communicative. Skin: Warm and dry.  No rashes noted.  Data Review: I have personally reviewed the following laboratory data and studies,  CBC: Recent Labs  Lab 04/21/21 1448  WBC 9.5  NEUTROABS 4.8  HGB 13.5  HCT 40.5  MCV 89.4  PLT 352    Basic Metabolic Panel: Recent Labs  Lab 04/21/21 1448  NA 136  K 3.9  CL 100  CO2 28  GLUCOSE 227*  BUN 19  CREATININE 0.89  CALCIUM 9.5    Liver Function Tests: Recent Labs  Lab 04/21/21 1448  AST 14*  ALT 12  ALKPHOS 64  BILITOT 0.3  PROT 6.4*  ALBUMIN 3.0*    No results for input(s): LIPASE, AMYLASE in the last 168 hours. No results for input(s): AMMONIA in the last 168 hours. Cardiac Enzymes: No results for input(s): CKTOTAL, CKMB, CKMBINDEX, TROPONINI in the last 168 hours. BNP (last 3 results) Recent Labs    03/24/21 0856 03/25/21 0203 03/26/21 0222  BNP 29.2 33.9 37.9     ProBNP (last 3 results) No results for input(s): PROBNP in the last 8760 hours.  CBG: Recent Labs  Lab 04/25/21 1209 04/25/21 1549 04/25/21 2106 04/26/21 0753 04/26/21 1208  GLUCAP 198*  317* 168* 265* 390*    No results found for this or any previous visit (from the past 240 hour(s)).   Studies: No results found.    Joycelyn Das, MD  [facility] 04/26/2021  If 7PM-7AM, please contact night-coverage

## 2021-04-27 LAB — GLUCOSE, CAPILLARY
Glucose-Capillary: 267 mg/dL — ABNORMAL HIGH (ref 70–99)
Glucose-Capillary: 175 mg/dL — ABNORMAL HIGH (ref 70–99)
Glucose-Capillary: 184 mg/dL — ABNORMAL HIGH (ref 70–99)
Glucose-Capillary: 269 mg/dL — ABNORMAL HIGH (ref 70–99)

## 2021-04-27 NOTE — Progress Notes (Signed)
Patient seen and examined.  Long stay patient.  She is here for 160s now.  She was in the room and then later on nursing staff helped her to the nursing station.  Pleasant and denies any complaints.  Plan: Continue to work with therapies and nursing staff to increase mobility and safety.  Awaiting for skilled nursing facility placement.  Total time spent: 15 minutes

## 2021-04-27 NOTE — Plan of Care (Signed)

## 2021-04-28 LAB — GLUCOSE, CAPILLARY
Glucose-Capillary: 85 mg/dL (ref 70–99)
Glucose-Capillary: 432 mg/dL — ABNORMAL HIGH (ref 70–99)
Glucose-Capillary: 36 mg/dL — CL (ref 70–99)
Glucose-Capillary: 44 mg/dL — CL (ref 70–99)
Glucose-Capillary: 216 mg/dL — ABNORMAL HIGH (ref 70–99)
Glucose-Capillary: 384 mg/dL — ABNORMAL HIGH (ref 70–99)

## 2021-04-28 MED ORDER — OLANZAPINE 5 MG PO TBDP
5.0000 mg | ORAL_TABLET | Freq: Two times a day (BID) | ORAL | Status: DC
  Administered 2021-04-28 – 2021-04-30 (×5): 5 mg via ORAL
  Filled 2021-04-28 (×6): qty 1

## 2021-04-28 NOTE — Progress Notes (Signed)
Ppt cbg 33, gave pt 2 cups of orange juice and some gram crackers,  on retake cbg was 44. At this time  I secured IV access to the right AC, and  and pushed dextrose.  cbg  post dextrose =-216

## 2021-04-28 NOTE — TOC Progression Note (Signed)
Transition of Care [facility]) - Progression Note    Patient Details  Name: Carolyn Norton MRN: [id number] Date of Birth: 04-18-1966  [facility]) CM/SW Contact  Janae Bridgeman, RN Phone Number: 04/28/2021, 9:07 AM  Clinical Narrative:    Case management called and spoke with [name], CM at [facility] and the DON at the facility is review the patient's clinicals at this time.  I also spoke with Essie Hart, CM with [facility] nursing facility and new clinicals were placed in the hub for the facility to review today.  The patient has not bed offers at this time for [facility] placement with both Medicaid and disability are pending.   Expected Discharge Plan: Hospice Medical Facility Barriers to Discharge: Inadequate or no insurance, Family Issues  Expected Discharge Plan and Services Expected Discharge Plan: Hospice Medical Facility In-house Referral: Clinical Social Work Discharge Planning Services: CM Consult Post Acute Care Choice: Skilled Nursing Facility Living arrangements for the past 2 months: Single Family Home Expected Discharge Date: 01/16/21               DME Arranged: [name] rolling DME Agency: [employer] Date DME Agency Contacted: 01/20/21 Time DME Agency Contacted: 1435 Representative spoke with at DME Agency: [name]             Social Determinants of Health (SDOH) Interventions    Readmission Risk Interventions Readmission Risk Prevention Plan 01/14/2021  Transportation Screening Complete  PCP or Specialist Appt within 3-5 Days (No Data)  Social Work Consult for Recovery Care Planning/Counseling Complete  Palliative Care Screening Not Applicable  Medication Review Oceanographer) Referral to Pharmacy  Some recent data might be hidden

## 2021-04-28 NOTE — Progress Notes (Signed)
Verbal received from Dr. Corrie Mckusick to d/c PM lantus

## 2021-04-28 NOTE — Progress Notes (Addendum)
[facility] PROGRESS NOTE  Carolyn Norton AOZ:[id number] DOB: Feb 17, 1966 DOA: 01/11/2021 PCP: [name], FNP            March 27, 2021     Status: Remains inpatient appropriate because:Unsafe d/c plan-due to patient's underlying poor mental status short-term memory deficits she will require 24/7 care after discharge  Dispo:  Patient From:  Home  Planned Disposition: SNF w/ Hospice follow up at facility  Medically stable for discharge:  yes  Barriers to DC: Medicaid still pending; prior history of substance abuse and underlying documented bipolar disorder             Difficult to place: Yes  Level of care: Med-Surg  Code Status: DNR Family Communication: Father 8/03.  Updated on patient's improved mental status and suspicions that the low-dose Zyprexa that was initiated for anorexia has actually improved her mental status.  Explained that we are not certain what is the cause of her changes but clearly this medication appears to be improving her overall alertness. DVT prophylaxis: Lovenox COVID vaccination status: Unknown-post COVID infection 09/06/20   HPI: 55 year old female past medical history of diabetes mellitus type 2, bipolar disorder type I, previous COVID infection, polysubstance abuse that includes cocaine.  She presented from home where she lives with her parents.  Her presenting symptoms included generalized weakness, multiple falls and confusion.  Family confirmed that she had been having the symptoms for at least a month with last use of crack cocaine 1 month prior to presentation.  Ever since that time she had not been doing well.  She would also be having issues with hyperglycemia.  In the ER she was diagnosed with severe dehydration, hyperglycemia and metabolic encephalopathy.  Since admission patient has been evaluated by the psychiatric team who has deemed her alert and oriented and having capacity to make decisions.  Patient is agreeable to  placement at a rehab.  Currently she has severe physical deconditioning which was POA and PT is recommending SNF.  Because of her history of polysubstance abuse been difficult to obtain an SNF bed.  Initially her family was agreeable to taking her back in home when she is able to ambulate well enough to get back and forth for meals into the bathroom.  Unfortunately her cognitive and physical status has progressively declined.  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  Palliative medicine assisted but have signed off. Patient's previous advanced directives stated she did not want a PEG tube if she were unable to eat. Unfortunately no improvement with short term enteral feeds. Cortrak dc'd. Famliy not ready for comfort measures.   Beginning on 8/2 patient had improved mentation.  Likely 2/2 recent addition of Zyprexa.  As of 8/19 therapies document patient continuing to improve although still requires max assist for most activities and basic functions.  She also still requires assistance with meal prep and eats better if actually fed.  Subjective: Awake and alert.  Somewhat anxious with some mild hand tremors which patient attributes to possible hypoglycemia.  Better oriented today noting only disoriented to year.  Objective: Vitals:   04/28/21 0022 04/28/21 0450  BP: 130/72 130/69  Pulse: 85 65  Resp: 17 15  Temp: (!) 97.4 F (36.3 C) 97.7 F (36.5 C)  SpO2: 95% 100%    Intake/Output Summary (Last 24 hours) at 04/28/2021 0737 Last data filed at 04/27/2021 1808 Gross per 24 hour  Intake 360  ml  Output 400 ml  Net -40 ml     Filed Weights   04/19/21 0232 04/20/21 0500 04/21/21 0600  Weight: 68.5 kg 70.4 kg 70 kg   Constitutional: No acute distress, alert and sitting up in bed Respiratory: Lungs remain clear, no increased work of breathing.  Stable on room air. Abdomen: LBM 8/18, soft nontender  nondistended.  Continues with poor oral intake.  Encouraged to at least drink protein shakes. Neurologic: Cranial nerves are grossly intact.  More drowsy today but patient has awareness of that.  No focal neurological deficits.  Upper and lower extremity strength 3/5 Psychiatric: Awake.  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   Thorough evaluation revealed symptoms not related to NPH MRI of her spine without any abnormalities to explain her progressive lower extremity weakness. Neurology suspects chronic progressive neurological decline 2/2 longstanding drug abuse. 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  Repeat EEG and MRI unchanged from prior studies Significant improvement in mentation after addition of Zyprexa although she continues to remain confused to year-episode of agitation over the WE- will add daytime dose to Zyprexa  COPD exacerbation Resolved Completed steroid taper Continue DuoNeb prn  Dysphagia/failure to thrive secondary to multifactorial neurological condition/anorexia 7/27 PMT added low-dose Zyprexa as appetite stimulant and noted w/ improvement in mentation and somewhat in appetite OOB to chair for all meals and feed pt Document all intake including snacks  Profound physical deconditioning/critical illness myopathy Likely a combination of persistent altered mental status and inability to participate consistently with PT as well as 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   Continue Semglee to 26 units a.m. and 14 units p.m. Continue 3 units meal coverage Continue resistant SSI  History of bipolar 1 disorder/polysubstance abuse/cognitive impairment Home escitalopram, Depakote and Neurontin discontinued Given improvement in mental status will continue Zyprexa 5 mg to treat her bipolar disorder.   Elevated blood pressure/New dx HTN/murmur No prior documented history of hypertension  Continue low-dose Norvasc 8/2 Echocardiogram this admission revealed no valvular abnormalities but was c/w diastolic dysfunction; murmur has resolved was likely 2/2 hyperdynamic state  Dyslipidemia Hold statin until oral intake improves  Physical deconditioning/bilateral heel cord shortening Continue bilateral PRAFO boots on every 4 hours and off every 4 hours Given progressive neurological decline unable to participate with therapies Patient becomes extremely fatigued after OT and PT session    Other problems: GERD Oral PPI on hold while n.p.o.  Hypokalemia/hypomagnesemia Resolved  E. Coli [name] UTI Treated Use purewick only at bedtime    Data Reviewed: Basic Metabolic Panel: Recent Labs  Lab 04/21/21 1448  NA 136  K 3.9  CL 100  CO2 28  GLUCOSE 227*  BUN 19  CREATININE 0.89  CALCIUM 9.5    Liver Function Tests: Recent Labs  Lab 04/21/21 1448  AST 14*  ALT 12  ALKPHOS 64  BILITOT 0.3  PROT 6.4*  ALBUMIN 3.0*  No results for input(s): LIPASE, AMYLASE in the last 168 hours. No results for input(s): AMMONIA in the last 168 hours. CBC: Recent Labs  Lab 04/21/21 1448  WBC 9.5  NEUTROABS 4.8  HGB 13.5  HCT 40.5  MCV 89.4  PLT 352     Cardiac Enzymes: No results for input(s):  CKTOTAL, CKMB, CKMBINDEX, TROPONINI in the last 168 hours. BNP (last 3 results) Recent Labs    03/24/21 0856 03/25/21 0203 03/26/21 0222  BNP 29.2 33.9 37.9    ProBNP (last 3 results) No results for input(s): PROBNP in the last 8760 hours.  CBG: Recent Labs  Lab 04/26/21 2144 04/27/21 0749 04/27/21 1150 04/27/21 1541 04/27/21 2032  GLUCAP 163* 267* 175* 184* 269*    No results found for this or any previous visit (from the past 240 hour(s)).     Studies: No results found.  Scheduled Meds:  (feeding supplement) PROSource Plus  30 mL Oral TID BM   amLODipine  5 mg Oral Daily   docusate sodium  100 mg Oral BID   enoxaparin (LOVENOX) injection  40 mg Subcutaneous Q24H   feeding supplement (GLUCERNA SHAKE)  237 mL Oral TID BM   insulin aspart  0-20 Units Subcutaneous TID WC   insulin aspart  0-5 Units Subcutaneous QHS   insulin aspart  4 Units Subcutaneous TID WC   insulin glargine-yfgn  26 Units Subcutaneous Daily   And   insulin glargine-yfgn  14 Units Subcutaneous QHS   metoprolol tartrate  50 mg Oral BID   multivitamin  15 mL Oral Daily   OLANZapine zydis  5 mg Oral QHS   polyethylene glycol  17 g Oral Daily   senna  1 tablet Oral BID   Continuous Infusions:      Principal Problem:   Bipolar 1 disorder, depressed, full remission (HCC) Active Problems:   Bipolar 1 disorder (HCC)   Hypertension   Generalized weakness   Controlled type 2 diabetes mellitus with hyperglycemia, with long-term current use of insulin (HCC)   Hyperlipidemia   NPH (normal pressure hydrocephalus) (HCC)   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   Protein-calorie malnutrition, severe   Consultants: Psychiatry Neurology Neurosurgery  Procedures: None  Antibiotics: Anti-infectives (From admission, onward)    Start     Dose/Rate Route Frequency Ordered Stop   03/06/21 0900  ceFAZolin (ANCEF) IVPB 1 g/50 mL premix        1 g 100 mL/hr over 30 Minutes Intravenous Every 8 hours  03/06/21 0801 03/10/21 0559   03/04/21 1430  cefTRIAXone (ROCEPHIN) 1 g in sodium chloride 0.9 % 100 mL IVPB  Status:  Discontinued        1 g 200 mL/hr over 30 Minutes Intravenous Every 24 hours 03/04/21 1335 03/06/21 0800   02/14/21 0828  vancomycin (VANCOREADY) IVPB 1000 mg/200 mL  Status:  Discontinued        1,000 mg 200 mL/hr over 60 Minutes Intravenous 60 min pre-op 02/14/21 0828 03/02/21 1139   02/13/21 2115  cefTRIAXone (ROCEPHIN) 1 g in sodium chloride 0.9 % 100 mL IVPB  Status:  Discontinued        1 g 200 mL/hr over 30 Minutes Intravenous Every 24 hours 02/13/21 2016 02/16/21 0844        Time spent: 15 minutes    [name] ANP  [facility] 7 am - 330 pm/M-F for direct patient care and secure chat Please refer to Amion for contact info 106  days

## 2021-04-28 NOTE — Progress Notes (Signed)
Per Dr. Corrie Mckusick, do not administer PM dose of lantus. Follow the sliding scale protocol only

## 2021-04-29 DIAGNOSIS — U099 Post covid-19 condition, unspecified: Secondary | ICD-10-CM | POA: Diagnosis present

## 2021-04-29 DIAGNOSIS — R2990 Unspecified symptoms and signs involving the nervous system: Secondary | ICD-10-CM | POA: Diagnosis present

## 2021-04-29 LAB — GLUCOSE, CAPILLARY
Glucose-Capillary: 129 mg/dL — ABNORMAL HIGH (ref 70–99)
Glucose-Capillary: 313 mg/dL — ABNORMAL HIGH (ref 70–99)
Glucose-Capillary: 363 mg/dL — ABNORMAL HIGH (ref 70–99)
Glucose-Capillary: 142 mg/dL — ABNORMAL HIGH (ref 70–99)
Glucose-Capillary: 233 mg/dL — ABNORMAL HIGH (ref 70–99)

## 2021-04-29 LAB — RESP PANEL BY RT-PCR (FLU A&B, COVID) ARPGX2
Influenza A by PCR: NEGATIVE
Influenza B by PCR: NEGATIVE
SARS Coronavirus 2 by RT PCR: NEGATIVE

## 2021-04-29 MED ORDER — DULERA 200-5 MCG/ACT IN AERO: 2.0000 | INHALATION_SPRAY | Freq: Two times a day (BID) | RESPIRATORY_TRACT | Status: DC

## 2021-04-29 MED ORDER — INSULIN ASPART 100 UNIT/ML IJ SOLN
0.0000 [IU] | Freq: Three times a day (TID) | INTRAMUSCULAR | Status: DC
  Administered 2021-04-29: 9 [IU] via SUBCUTANEOUS
  Administered 2021-04-29: 1 [IU] via SUBCUTANEOUS
  Administered 2021-04-30: 2 [IU] via SUBCUTANEOUS
  Administered 2021-04-30: 3 [IU] via SUBCUTANEOUS

## 2021-04-29 MED ORDER — BISACODYL 10 MG RE SUPP: 10.0000 mg | Freq: Every day | RECTAL | 0 refills | Status: DC | PRN

## 2021-04-29 MED ORDER — METOPROLOL TARTRATE 50 MG PO TABS: 50.0000 mg | ORAL_TABLET | Freq: Two times a day (BID) | ORAL | Status: DC

## 2021-04-29 MED ORDER — IPRATROPIUM-ALBUTEROL 0.5-2.5 (3) MG/3ML IN SOLN: 3.0000 mL | Freq: Four times a day (QID) | RESPIRATORY_TRACT | Status: DC | PRN

## 2021-04-29 MED ORDER — ATORVASTATIN CALCIUM 20 MG PO TABS: 20.0000 mg | ORAL_TABLET | Freq: Every day | ORAL | 1 refills | Status: DC

## 2021-04-29 MED ORDER — GLUCERNA SHAKE PO LIQD: 237.0000 mL | Freq: Three times a day (TID) | ORAL | 0 refills | Status: DC

## 2021-04-29 MED ORDER — ALBUTEROL SULFATE HFA 108 (90 BASE) MCG/ACT IN AERS: 2.0000 | INHALATION_SPRAY | RESPIRATORY_TRACT | 3 refills | Status: DC | PRN

## 2021-04-29 MED ORDER — INSULIN ASPART 100 UNIT/ML IJ SOLN: 4.0000 [IU] | Freq: Three times a day (TID) | INTRAMUSCULAR | 11 refills | Status: DC

## 2021-04-29 MED ORDER — OLANZAPINE 5 MG PO TBDP: 5.0000 mg | ORAL_TABLET | Freq: Two times a day (BID) | ORAL | Status: DC

## 2021-04-29 MED ORDER — COVID-19 MRNA VAC-TRIS(PFIZER) 30 MCG/0.3ML IM SUSP
0.3000 mL | Freq: Once | INTRAMUSCULAR | Status: AC
  Administered 2021-04-29: 0.3 mL via INTRAMUSCULAR
  Filled 2021-04-29: qty 0.3

## 2021-04-29 MED ORDER — INSULIN GLARGINE-YFGN 100 UNIT/ML ~~LOC~~ SOLN
20.0000 [IU] | Freq: Every day | SUBCUTANEOUS | Status: DC
  Administered 2021-04-29 – 2021-04-30 (×2): 20 [IU] via SUBCUTANEOUS
  Filled 2021-04-29 (×2): qty 0.2

## 2021-04-29 MED ORDER — DOCUSATE SODIUM 100 MG PO CAPS: 100.0000 mg | ORAL_CAPSULE | Freq: Two times a day (BID) | ORAL | 0 refills | Status: DC

## 2021-04-29 MED ORDER — INSULIN ASPART 100 UNIT/ML IJ SOLN: 0.0000 [IU] | Freq: Three times a day (TID) | INTRAMUSCULAR | 11 refills | Status: DC

## 2021-04-29 MED ORDER — POLYETHYLENE GLYCOL 3350 17 G PO PACK: 17.0000 g | PACK | Freq: Every day | ORAL | 0 refills | Status: AC

## 2021-04-29 MED ORDER — INSULIN GLARGINE-YFGN 100 UNIT/ML ~~LOC~~ SOLN
6.0000 [IU] | SUBCUTANEOUS | Status: DC
  Filled 2021-04-29: qty 0.06

## 2021-04-29 MED ORDER — INSULIN GLARGINE-YFGN 100 UNIT/ML ~~LOC~~ SOLN: 20.0000 [IU] | Freq: Every day | SUBCUTANEOUS | 11 refills | Status: DC

## 2021-04-29 MED ORDER — INSULIN ASPART 100 UNIT/ML IJ SOLN
0.0000 [IU] | Freq: Every day | INTRAMUSCULAR | Status: DC
  Administered 2021-04-29: 2 [IU] via SUBCUTANEOUS

## 2021-04-29 MED ORDER — PROSOURCE PLUS PO LIQD: 30.0000 mL | Freq: Three times a day (TID) | ORAL | Status: DC

## 2021-04-29 MED ORDER — INSULIN GLARGINE-YFGN 100 UNIT/ML ~~LOC~~ SOLN: 6.0000 [IU] | SUBCUTANEOUS | 11 refills | Status: DC

## 2021-04-29 MED ORDER — INSULIN ASPART 100 UNIT/ML IJ SOLN: 0.0000 [IU] | Freq: Every day | INTRAMUSCULAR | 11 refills | Status: DC

## 2021-04-29 MED ORDER — AMLODIPINE BESYLATE 5 MG PO TABS: 5.0000 mg | ORAL_TABLET | Freq: Every day | ORAL | Status: DC

## 2021-04-29 MED ORDER — ADULT MULTIVITAMIN LIQUID CH
15.0000 mL | Freq: Every day | ORAL | Status: DC
Start: 2021-04-30 — End: 2022-01-22

## 2021-04-29 MED ORDER — INSULIN GLARGINE-YFGN 100 UNIT/ML ~~LOC~~ SOLN
6.0000 [IU] | SUBCUTANEOUS | Status: DC
  Administered 2021-04-29: 6 [IU] via SUBCUTANEOUS
  Filled 2021-04-29 (×2): qty 0.06

## 2021-04-29 MED ORDER — MONTELUKAST SODIUM 10 MG PO TABS: 10.0000 mg | ORAL_TABLET | Freq: Every day | ORAL | 1 refills | Status: DC

## 2021-04-29 MED ORDER — SENNA 8.6 MG PO TABS: 1.0000 | ORAL_TABLET | Freq: Two times a day (BID) | ORAL | 0 refills | Status: DC

## 2021-04-29 MED ORDER — ACETAMINOPHEN 325 MG PO TABS: 650.0000 mg | ORAL_TABLET | Freq: Four times a day (QID) | ORAL | Status: DC | PRN

## 2021-04-29 NOTE — Progress Notes (Signed)
Inpatient Diabetes Program Recommendations  AACE/ADA: New Consensus Statement on Inpatient Glycemic Control (2015)  Target Ranges:  Prepandial:   less than 140 mg/dL      Peak postprandial:   less than 180 mg/dL (1-2 hours)      Critically ill patients:  140 - 180 mg/dL   Lab Results  Component Value Date   GLUCAP 313 (H) 04/29/2021   HGBA1C 8.8 (H) 03/20/2021    Review of Glycemic Control Results for Carolyn Norton, Carolyn Norton (MRN [id number]) as of 04/29/2021 09:56  Ref. Range 04/28/2021 07:56 04/28/2021 11:32 04/28/2021 16:35 04/28/2021 16:57 04/28/2021 17:50 04/28/2021 21:25 04/29/2021 07:48 04/29/2021 08:51  Glucose-Capillary Latest Ref Range: 70 - 99 mg/dL 85 144 (H) 36 (LL) 44 (LL) 216 (H) 384 (H) 129 (H) 313 (H)   Diabetes history: DM 1 Current orders for Inpatient glycemic control:  Novolog resistant tid with meals and HS Novolog 4 units tid with meals Semglee 26 units q AM and 6 units q PM  Inpatient Diabetes Program Recommendations:    It appears patient did not receive Novolog meal coverage yesterday morning and blood sugar up >400 mg/dL at lunch. Patient received Novolog 24 units at lunch and blood sugar dropped to 44 mg/dL.  Please consider reducing Novolog to sensitive tid with meals.  Thanks, Beryl Meager, RN, BC-ADM Inpatient Diabetes Coordinator Pager [phone] (8a-5p)

## 2021-04-29 NOTE — TOC Progression Note (Signed)
Transition of Care [facility]) - Progression Note    Patient Details  Name: Carolyn Norton MRN: [id number] Date of Birth: 1966/02/01  Transition of Care [facility]) CM/SW Contact  Janae Bridgeman, RN Phone Number: 04/29/2021, 1:42 PM  Clinical Narrative:    Case management spoke with [name], CM at [facility] and the facility is willing to accept the patient for admission on 04/30/2021.  [name], NP was notified and she is aware that the patient will transfer to the facility around 11 am tomorrow via [facility].  CM and MSW will continue to follow the patient for SNF placement pending tomorrow.   Expected Discharge Plan: Hospice Medical Facility Barriers to Discharge: Inadequate or no insurance, Family Issues  Expected Discharge Plan and Services Expected Discharge Plan: Hospice Medical Facility In-house Referral: Clinical Social Work Discharge Planning Services: CM Consult Post Acute Care Choice: Skilled Nursing Facility Living arrangements for the past 2 months: Single Family Home Expected Discharge Date: 01/16/21               DME Arranged: [name] rolling DME Agency: [employer] Date DME Agency Contacted: 01/20/21 Time DME Agency Contacted: 1435 Representative spoke with at DME Agency: [name]             Social Determinants of Health (SDOH) Interventions    Readmission Risk Interventions Readmission Risk Prevention Plan 01/14/2021  Transportation Screening Complete  PCP or Specialist Appt within 3-5 Days (No Data)  Social Work Consult for Recovery Care Planning/Counseling Complete  Palliative Care Screening Not Applicable  Medication Review [employer]) Referral to Pharmacy  Some recent data might be hidden

## 2021-04-29 NOTE — Progress Notes (Signed)
Occupational Therapy Treatment Patient Details Name: Carolyn Norton MRN: [id number] DOB: Oct 05, 1965 Today's Date: 04/29/2021    History of present illness Pt is a 55 y.o. female initially admitted nearly 3 months ago on 01/11/21 with multiple falls, AMS, weakness. Workup for severe dehydration, DKA and metabolic encephalopathy. 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 7/1 underwent lumbar puncture and drained large volume of CSF to assess for potential improvement in symptomology (none noted);  7/2 MRI of the brain, cervical, thoracic and lumbar spine with no acute changes to explain LE weakness. PT signed off 7/12 secondary to lack of progress and further cognitive/functional decline. Cortrak placed 7/15 secondary to poor [email or web address]. Family declining comfort measures. On 8/2 patient with increased alertness per MD after beginning medication for anorexia and PT/OT reiniated. PMH includes DM2, COPD, CKD3, GERD, bipolar disorder, COVID-19, polysubstance abuse including cocaine.   OT comments  OT treatment session with focus on self-care re-education, functional cognition, sitting/standing balance and command following. Patient following 1-step verbal commands with 85% accuracy this date. Continues to require cues for sequencing familiar ADL tasks (although requiring fewer cues each session). Patient able to stand from EOB/BSC/recliner to stedy with Min guard for safety and cues for anterior weight shift to ensure space to pull down paddles (seat). Patient able to have BM but did not void bladder. Patient making needs known throughout session reporting desire to call her mother, get a cigarette and go outside. Patient continues to demonstrated decreased cognition with poor STM, problem solving and sequencing requiring cues. Patient also limited by decrease balance, decreased strength and decreased activity tolerance. OT will continue to follow acutely.    Follow Up  Recommendations  Other (comment);SNF (Long-term/custodial care)    Equipment Recommendations  Other (comment) (Defer to next level of care)    Recommendations for Other Services      Precautions / Restrictions Precautions Precautions: Fall Precaution Comments: High fall risk       Mobility Bed Mobility Overal bed mobility: Needs Assistance Bed Mobility: Rolling Rolling: Min assist Sidelying to sit: Mod assist;HOB elevated;+2 for physical assistance       General bed mobility comments: Min A for rolling to L and Mod A for to elevate trunk to sitting with HOB elevated. Cues for sequencing.    Transfers Overall transfer level: Needs assistance   Transfers: Sit to/from Stand Sit to Stand: Mod assist;+2 physical assistance;+2 safety/equipment Stand pivot transfers: +2 safety/equipment;Total assist       General transfer comment: Patient able to stand in [place] with Min gaurd x4 trials from EOB > BSC > recliner. Cues for anterior weight shift to make room to bring down paddles on stedy.    Balance Overall balance assessment: Needs assistance Sitting-balance support: Bilateral upper extremity supported;Feet supported Sitting balance-Leahy Scale: Poor Sitting balance - Comments: Min A progressing to Min guard to maintain static sitting at EOB with and without unilateral UE support. Assist for lateral scoot toward EOB. Postural control: Posterior lean Standing balance support: During functional activity;Bilateral upper extremity supported Standing balance-Leahy Scale: Poor Standing balance comment: Requires BUE support and +2 assist. Strong posterior bias (slightly better this date). Difficulty shifting weight anteriorly even with max multimodal cues.                           ADL either performed or assessed with clinical judgement   ADL Overall ADL's : Needs assistance/impaired     Grooming: Minimal  assistance;Sitting Grooming Details (indicate cue type and  reason): In perched position in stedy at sink surface. Completes hand washing task with 1-2 cues for sequencing. Does not require external assist.         Upper Body Dressing : Minimal assistance;Sitting Upper Body Dressing Details (indicate cue type and reason): Donned anterior gown seated EOB.     Toilet Transfer: Moderate assistance;+2 for physical assistance;+2 for safety/equipment Toilet Transfer Details (indicate cue type and reason): Stedy for transfer to [facility] for time management. Toileting- Clothing Manipulation and Hygiene: Moderate assistance;+2 for physical assistance;+2 for safety/equipment;Sit to/from stand Toileting - Clothing Manipulation Details (indicate cue type and reason): Use of Stedy for hygiene/clothing management. Patient able to pull to standing with Min guard to boost. Significant assist and cues required for anterior weight shift.             Vision       Perception     Praxis      Cognition Arousal/Alertness: Lethargic Behavior During Therapy: WFL for tasks assessed/performed Overall Cognitive Status: Impaired/Different from baseline Area of Impairment: Attention;Following commands;Problem solving                 Orientation Level: Disoriented to;Time;Situation Current Attention Level: Sustained Memory: Decreased short-term memory;Decreased recall of precautions Following Commands: Follows one step commands with increased time Safety/Judgement: Decreased awareness of deficits;Decreased awareness of safety Awareness: Intellectual Problem Solving: Slow processing;Decreased initiation;Difficulty sequencing;Requires verbal cues;Requires tactile cues General Comments: Follows 1-step verbal commands wtih 85% accuracy this date; able to make most needs/desires known (wanted to talk to mother on phone, wanted to go outside and wanted to smoke a cigarette).        Exercises     Shoulder Instructions       General Comments Reports feeling  "weak" after toileting task. States she feels like her sugar is low. RN notified. Patient given grape juice at conclusion of session.    Pertinent Vitals/ Pain       Pain Assessment: No/denies pain Pain Intervention(s): Monitored during session  Home Living                                          Prior Functioning/Environment              Frequency  Min 2X/week        Progress Toward Goals  OT Goals(current goals can now be found in the care plan section)  Progress towards OT goals: Progressing toward goals  Acute Rehab OT Goals Patient Stated Goal: To return home with her mother/father. OT Goal Formulation: Patient unable to participate in goal setting Time For Goal Achievement: 05/07/21 Potential to Achieve Goals: Fair ADL Goals Pt Will Perform Eating: with set-up;sitting Pt Will Perform Grooming: with set-up;sitting Pt Will Perform Upper Body Dressing: with min assist;sitting Pt Will Perform Lower Body Dressing: with mod assist;sit to/from stand Pt Will Transfer to Toilet: with mod assist;bedside commode Pt Will Perform Toileting - Clothing Manipulation and hygiene: with mod assist;sit to/from stand;sitting/lateral leans Pt/caregiver will Perform Home Exercise Program: Increased strength;Both right and left upper extremity;With theraband;With written HEP provided Additional ADL Goal #1: Patietn will follow 1-step verbal commands with 95% accuracy in prep for ADLs at bed level vs. in sitting. Additional ADL Goal #2: Patient will follow 1-step verbal commands with 50% accuracy in prep for ADLs. Additional ADL Goal #3: Patient  will complete 3/3 grooming tasks in supported sitting position with Mod A and Max multimodal cues.  Plan Discharge plan remains appropriate;Frequency needs to be updated    Co-evaluation                 AM-PAC OT "6 Clicks" Daily Activity     Outcome Measure   Help from another person eating meals?: A Little Help  from another person taking care of personal grooming?: A Little Help from another person toileting, which includes using toliet, bedpan, or urinal?: A Lot Help from another person bathing (including washing, rinsing, drying)?: A Lot Help from another person to put on and taking off regular upper body clothing?: A Lot Help from another person to put on and taking off regular lower body clothing?: A Lot 6 Click Score: 14    End of Session Equipment Utilized During Treatment: Gait belt  OT Visit Diagnosis: Unsteadiness on feet (R26.81);Other abnormalities of gait and mobility (R26.89);Muscle weakness (generalized) (M62.81);Other symptoms and signs involving cognitive function;Other symptoms and signs involving the nervous system (R29.898)   Activity Tolerance     Patient Left in chair;with call bell/phone within reach;with bed alarm set   Nurse Communication Mobility status;Other (comment) (Patient feeling weak after getting insuline)        Time: 6213-0865 OT Time Calculation (min): 48 min  Charges: OT General Charges $OT Visit: 1 Visit OT Treatments $Self Care/Home Management : 23-37 mins $Therapeutic Activity: 8-22 mins  Aariv Medlock H. OTR/L Supplemental OT, Department of rehab services [phone]   Ravan Schlemmer R H. 04/29/2021, 1:44 PM

## 2021-04-29 NOTE — Progress Notes (Addendum)
[facility] PROGRESS NOTE  Carolyn Norton WUJ:[id number] DOB: 1966-03-01 DOA: 01/11/2021 PCP: [name], FNP            March 27, 2021     Status: Remains inpatient appropriate because:Unsafe d/c plan-due to patient's underlying poor mental status short-term memory deficits she will require 24/7 care after discharge  Dispo:  Patient From:  Home  Planned Disposition: SNF w/ Hospice follow up at facility-patient has a tentative bed offer at [facility] and will likely discharge on 8/24  Medically stable for discharge:  yes  Barriers to DC: Medicaid still pending; prior history of substance abuse and underlying documented bipolar disorder             Difficult to place: Yes  Level of care: Med-Surg  Code Status: DNR Family Communication: Father 8/03.  Updated on patient's improved mental status and suspicions that the low-dose Zyprexa that was initiated for anorexia has actually improved her mental status.  Explained that we are not certain what is the cause of her changes but clearly this medication appears to be improving her overall alertness. DVT prophylaxis: Lovenox COVID vaccination status: Unknown-post COVID infection 09/06/20; will be given first dose of COVID vaccine/Pfizer today on 8/23   HPI: 55 year old female past medical history of diabetes mellitus type 2, bipolar disorder type I, previous COVID infection, polysubstance abuse that includes cocaine.  She presented from home where she lives with her parents.  Her presenting symptoms included generalized weakness, multiple falls and confusion.  Family confirmed that she had been having the symptoms for at least a month with last use of crack cocaine 1 month prior to presentation.  Ever since that time she had not been doing well.  She would also be having issues with hyperglycemia.  In the ER she was diagnosed with severe dehydration, hyperglycemia and metabolic encephalopathy.  Since admission patient  has been evaluated by the psychiatric team who has deemed her alert and oriented and having capacity to make decisions.  Patient is agreeable to placement at a rehab.  Currently she has severe physical deconditioning which was POA and PT is recommending SNF.  Because of her history of polysubstance abuse been difficult to obtain an SNF bed.  Initially her family was agreeable to taking her back in home when she is able to ambulate well enough to get back and forth for meals into the bathroom.  Unfortunately her cognitive and physical status has progressively declined.  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  Palliative medicine assisted but have signed off. Patient's previous advanced directives stated she did not want a PEG tube if she were unable to eat. Unfortunately no improvement with short term enteral feeds. Cortrak dc'd. Famliy not ready for comfort measures.   Beginning on 8/2 patient had improved mentation.  Likely 2/2 recent addition of Zyprexa.  As of 8/19 therapies document patient continuing to improve although still requires max assist for most activities and basic functions.  She also still requires assistance with meal prep and eats better if actually fed.  Subjective: Alert, still not eating much but is drinking protein shakes.  Patient reports she was symptomatic with her hypoglycemia yesterday but feels better now  Objective: Vitals:   04/28/21 1850 04/28/21 2132  BP: 112/76 133/84  Pulse: 96 94  Resp: 16   Temp: 98.1 F (36.7 C) 97.8 F (36.6 C)  SpO2: 99% 98%  Intake/Output Summary (Last 24 hours) at 04/29/2021 0807 Last data filed at 04/28/2021 1135 Gross per 24 hour  Intake 709 ml  Output 700 ml  Net 9 ml     Filed Weights   04/19/21 0232 04/20/21 0500 04/21/21 0600  Weight: 68.5 kg 70.4 kg 70 kg   Constitutional: NAD, alert and conversant Respiratory: Anterior lung  sounds clear without any wheezing, good air movement.  Room air Abdomen: LBM 8/22, soft nontender nondistended.  Continues with poor oral intake.  Encouraged to at least drink protein shakes. Neurologic: Cranial nerves are grossly intact.  No focal neurological deficits.  Upper and lower extremity strength 3/5 Psychiatric: Awake.  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   Thorough evaluation revealed symptoms not related to NPH MRI of her spine without any abnormalities to explain her progressive lower extremity weakness. Neurology suspects chronic progressive neurological decline 2/2 longstanding drug abuse. 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  Repeat EEG and MRI unchanged from prior studies Significant improvement in mentation after addition of Zyprexa - will add daytime dose to Zyprexa  COPD exacerbation Resolved Completed steroid taper Continue DuoNeb prn  Dysphagia/failure to thrive secondary to multifactorial neurological condition/anorexia 7/27 PMT added low-dose Zyprexa as appetite stimulant and noted w/ improvement in mentation and somewhat in appetite OOB to chair for all meals and feed pt Document all intake including snacks  Profound physical deconditioning/critical illness myopathy Likely a combination of persistent altered mental status and inability to participate consistently with PT as well as 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  Suspect resume variability in CBG readings and improvement overall otherwise is related to completion of prednisone taper. Decrease a.m. Semglee to 2 units a.m. and change afternoon/evening Semglee to 6 units at 7 PM Continue 3 units meal coverage Discontinue resistant SSI in favor of sensitive SSI  History of bipolar 1 disorder/polysubstance abuse/cognitive impairment Home escitalopram, Depakote and Neurontin discontinued Given improvement in mental status will continue Zyprexa 5 mg BID to treat her bipolar disorder.   Elevated blood pressure/New dx HTN/murmur No prior documented history of hypertension  Continue low-dose Norvasc 8/2 Echocardiogram this admission revealed no valvular abnormalities but was c/w diastolic dysfunction; murmur has resolved was likely 2/2 hyperdynamic state  Dyslipidemia Hold statin until oral intake improves  Physical deconditioning/bilateral heel cord shortening Continue bilateral PRAFO boots on every 4 hours and off every 4 hours Given progressive neurological decline unable to participate with therapies Patient becomes extremely fatigued after OT and PT session    Other problems: GERD Oral PPI on hold while n.p.o.  Hypokalemia/hypomagnesemia Resolved  E. Coli [name]/Klebsiella UTI Treated Use purewick only at bedtime    Data Reviewed: Basic Metabolic Panel: No results for input(s): NA, K, CL, CO2, GLUCOSE, BUN, CREATININE, CALCIUM, MG, PHOS in the last 168 hours.   Liver  Function Tests: No results for input(s): AST, ALT, ALKPHOS, BILITOT, PROT, ALBUMIN in the last 168 hours.     No results for input(s): LIPASE, AMYLASE in the last 168  hours. No results for input(s): AMMONIA in the last 168 hours. CBC: No results for input(s): WBC, NEUTROABS, HGB, HCT, MCV, PLT in the last 168 hours.    Cardiac Enzymes: No results for input(s): CKTOTAL, CKMB, CKMBINDEX, TROPONINI in the last 168 hours. BNP (last 3 results) Recent Labs    03/24/21 0856 03/25/21 0203 03/26/21 0222  BNP 29.2 33.9 37.9    ProBNP (last 3 results) No results for input(s): PROBNP in the last 8760 hours.  CBG: Recent Labs  Lab 04/28/21 1635 04/28/21 1657 04/28/21 1750 04/28/21 2125 04/29/21 0748  GLUCAP 36* 44* 216* 384* 129*    No results found for this or any previous visit (from the past 240 hour(s)).     Studies: No results found.  Scheduled Meds:  (feeding supplement) PROSource Plus  30 mL Oral TID BM   amLODipine  5 mg Oral Daily   docusate sodium  100 mg Oral BID   enoxaparin (LOVENOX) injection  40 mg Subcutaneous Q24H   feeding supplement (GLUCERNA SHAKE)  237 mL Oral TID BM   insulin aspart  0-20 Units Subcutaneous TID WC   insulin aspart  0-5 Units Subcutaneous QHS   insulin aspart  4 Units Subcutaneous TID WC   insulin glargine-yfgn  20 Units Subcutaneous Daily   insulin glargine-yfgn  6 Units Subcutaneous Daily   metoprolol tartrate  50 mg Oral BID   multivitamin  15 mL Oral Daily   OLANZapine zydis  5 mg Oral BID   polyethylene glycol  17 g Oral Daily   senna  1 tablet Oral BID   Continuous Infusions:      Principal Problem:   Bipolar 1 disorder, depressed, full remission (HCC) Active Problems:   Bipolar 1 disorder (HCC)   Hypertension   Generalized weakness   Controlled type 2 diabetes mellitus with hyperglycemia, with long-term current use of insulin (HCC)   Hyperlipidemia   NPH (normal pressure hydrocephalus) (HCC)   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   Protein-calorie malnutrition, severe   Consultants: Psychiatry Neurology Neurosurgery  Procedures: None  Antibiotics: Anti-infectives (From  admission, onward)    Start     Dose/Rate Route Frequency Ordered Stop   03/06/21 0900  ceFAZolin (ANCEF) IVPB 1 g/50 mL premix        1 g 100 mL/hr over 30 Minutes Intravenous Every 8 hours 03/06/21 0801 03/10/21 0559   03/04/21 1430  cefTRIAXone (ROCEPHIN) 1 g in sodium chloride 0.9 % 100 mL IVPB  Status:  Discontinued        1 g 200 mL/hr over 30 Minutes Intravenous Every 24 hours 03/04/21 1335 03/06/21 0800   02/14/21 0828  vancomycin (VANCOREADY) IVPB 1000 mg/200 mL  Status:  Discontinued        1,000 mg 200 mL/hr over 60 Minutes Intravenous 60 min pre-op 02/14/21 0828 03/02/21 1139   02/13/21 2115  cefTRIAXone (ROCEPHIN) 1 g in sodium chloride 0.9 % 100 mL IVPB  Status:  Discontinued        1 g 200 mL/hr over 30 Minutes Intravenous Every 24 hours 02/13/21 2016 02/16/21 0844        Time spent: 15 minutes    [name] ANP  [facility] 7 am - 330 pm/M-F for direct patient care and secure chat Please refer to [facility]  for contact info 107  days

## 2021-04-29 NOTE — Discharge Summary (Signed)
Physician Discharge Summary  Carolyn Norton VFI:[id number] DOB: 1965-11-15 DOA: 01/11/2021  PCP: Elbert Ewings, FNP  Admit date: 01/11/2021 Discharge date: 04/30/2021  Time spent: 35 minutes  Recommendations for Outpatient Follow-up:  Patient has ongoing anorexia as well as some upper extremity dysmetria thus is a total feed She will need to continue PT and OT for severe physical deconditioning relating to a prolonged period of altered mental status and bed rest during this hospitalization.  She must be encouraged to participate with therapy session.  Please do not allow her to not participate with therapies She will need to have her CBGs checked AC/HS. Sliding scale insulin has been ordered at discharge in addition to scheduled Semglee insulin. Prior to this admission did not carry a diagnosis of hypertension and was started on Norvasc this admission with improved BP control She will discharge to [facility] If she becomes stable enough to discharge from the facility back to her parents home she will need to follow-up with Elbert Ewings FNP (PCP)   Discharge Diagnoses:  Principal Problem:   Bipolar 1 disorder, depressed, full remission ([place]) Active Problems:   Bipolar 1 disorder ([place])   Hypertension   Generalized weakness   Controlled type 2 diabetes mellitus with hyperglycemia, with long-term current use of insulin ([facility])   Hyperlipidemia   NPH (normal pressure hydrocephalus) ([place])   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  She presented from home where she lives with her parents.  Her presenting symptoms included generalized weakness, multiple falls and confusion.  Family confirmed that she had been having the symptoms for at least a month with last use of crack cocaine 1 month prior to presentation.  Ever since that time she had not been doing well.  She would also be having issues with hyperglycemia.  In the ER she was diagnosed with severe dehydration, hyperglycemia and metabolic encephalopathy.   Since admission patient has been evaluated by the psychiatric team who has deemed her alert and oriented and having capacity to make decisions.  Patient is agreeable to placement at a rehab.  Currently she has severe physical deconditioning which was POA and PT is recommending SNF.  Because of her history of polysubstance abuse been difficult to obtain an SNF bed.  Initially her family was agreeable to taking her back in home when she is able to ambulate well enough to get back and forth for meals into the bathroom.  Unfortunately her cognitive and physical status has progressively declined.  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  Palliative medicine assisted but have signed off. Patient's previous advanced directives stated she did not want a PEG tube if she were unable to eat. Unfortunately no improvement with short term enteral feeds. Cortrak dc'd. Famliy not ready for comfort measures.    Beginning on 8/2 patient had improved mentation.  Likely 2/2 recent addition of Zyprexa.  As of 8/19 therapies document patient continuing to improve although still requires max assist for most activities and basic  functions.  She also still requires assistance with meal prep and eats better if actually fed.  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   Thorough evaluation revealed symptoms not related to NPH MRI of her spine without any abnormalities to explain her progressive lower extremity weakness. Neurology suspects chronic progressive neurological decline 2/2 longstanding drug abuse. 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  Repeat EEG and MRI unchanged from prior studies Significant improvement in mentation after addition of Zyprexa BID   COPD exacerbation Resolved Completed steroid taper Continue MDIs/LABA   Dysphagia/failure to thrive secondary to multifactorial neurological condition/anorexia 7/27 PMT added low-dose Zyprexa as appetite stimulant and noted w/ improvement in mentation and somewhat in appetite OOB to chair for all meals and feed pt Document all intake including snacks   Profound physical deconditioning/critical illness myopathy Likely a combination of persistent altered mental status and inability to participate consistently with PT as well as 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 Semglee to 2 units a.m. and evening Semglee  6 units at 7 PM Continue 3 units meal coverage Continue sensitive SSI   History of bipolar 1 disorder/polysubstance abuse/cognitive impairment Home escitalopram, Depakote and Neurontin discontinued Given improvement in mental status will continue Zyprexa 5 mg BID to treat her bipolar disorder.     Elevated blood pressure/New dx HTN/murmur No prior documented history of hypertension  Continue low-dose Norvasc 8/2 Echocardiogram this admission revealed no valvular abnormalities but was c/w diastolic dysfunction; murmur has resolved was likely 2/2 hyperdynamic state   Dyslipidemia Continue statin   Physical deconditioning/bilateral heel cord shortening Continue bilateral PRAFO boots on every 4 hours and off every 4 hours Patient becomes extremely fatigued after OT and PT session       Other problems: GERD Oral PPI on hold while n.p.o.   Hypokalemia/hypomagnesemia Resolved   E. Coli [employer] [employer] UTI Treated Use purewick only at bedtime  Procedures: Echocardiogram Multiple EEGs Large-volume LP x2 as diagnostic tool to rule out NPH  Consultations: Psychiatry Neurology Neurosurgery  Discharge Exam: Vitals:   04/29/21 1936 04/30/21 0556  BP: 109/60 129/78  Pulse: (!) 102 89  Resp: 19 19  Temp: 98.2 F (36.8 C) 98.5 F (36.9 C)  SpO2: 98% 98%   Constitutional: NAD, sleepy this morning which is typical after previous days therapy sessions Respiratory: Room air with normal pulse oximetry, lungs are clear to auscultation without wheezing or increased work of breathing Abdomen: LBM 8/22, soft and nontender.  Nondistended.  Normoactive bowel sounds.  Still with poor oral intake Neurologic: Cranial nerves are grossly intact.  No focal neurological deficits.  Upper and lower extremity strength 3/5 Psychiatric: Awake.  Oriented but not to year   Discharge Instructions   Discharge Instructions     Diet Carb Modified   Complete by: As directed    Discharge instructions   Complete by: As directed    Continue current medications as listed on the discharge summary  Because of issues with memory ongoing anorexia and  upper extremity dysmetria patient requires 100% assistance with meal prep and needs to be fed staff  Continue bilateral offloading boots to help with  foot drop  Please check glucometer readings AC/HS and administer sliding scale insulin as ordered  Continue physical therapy and Occupational Therapy for acquired profound physical deconditioning during recent hospitalization  Recommend checking CBC and electrolyte panel in 2 weeks after discharge   Increase activity slowly   Complete by: As directed    No wound care   Complete by: As directed       Allergies as of 04/30/2021       Reactions   Erythromycin Other (See Comments)   Childhood reaction        Medication List     STOP taking these medications    Advair HFA 230-21 MCG/ACT inhaler Generic drug: fluticasone-salmeterol   Depakote 500 MG DR tablet Generic drug: divalproex   Detrol LA 4 MG 24 hr capsule Generic drug: tolterodine   Dexilant 30 MG capsule Generic drug: Dexlansoprazole   escitalopram 20 MG tablet Commonly known as: LEXAPRO   gabapentin 300 MG capsule Commonly known as: NEURONTIN   Lantus SoloStar 100 UNIT/ML Solostar Pen Generic drug: insulin glargine   lisinopril 20 MG tablet Commonly known as: ZESTRIL   loratadine 10 MG tablet Commonly known as: CLARITIN   oxybutynin 10 MG 24 hr tablet Commonly known as: DITROPAN-XL   True Metrix Blood Glucose Test test strip Generic drug: glucose blood   True Metrix Meter w/Device Kit   TRUEplus Lancets 28G Misc       TAKE these medications    (feeding supplement) PROSource Plus liquid Take 30 mLs by mouth 3 (three) times daily between meals.   feeding supplement (GLUCERNA SHAKE) Liqd Take 237 mLs by mouth 3 (three) times daily between meals.   acetaminophen 325 MG tablet Commonly known as: TYLENOL Take 2 tablets (650 mg total) by mouth every 6 (six) hours as needed for mild pain, fever or headache.   albuterol 108 (90 Base) MCG/ACT inhaler Commonly known as: VENTOLIN HFA Inhale 2 puffs into the lungs every 4 (four) hours as needed for wheezing or shortness of breath. What  changed: Another medication with the same name was removed. Continue taking this medication, and follow the directions you see here.   amLODipine 5 MG tablet Commonly known as: NORVASC Take 1 tablet (5 mg total) by mouth daily.   atorvastatin 20 MG tablet Commonly known as: LIPITOR Take 1 tablet (20 mg total) by mouth daily.   bisacodyl 10 MG suppository Commonly known as: DULCOLAX Place 1 suppository (10 mg total) rectally daily as needed for moderate constipation or severe constipation.   docusate sodium 100 MG capsule Commonly known as: COLACE Take 1 capsule (100 mg total) by mouth 2 (two) times daily.   Dulera 200-5 MCG/ACT Aero Generic drug: mometasone-formoterol Inhale 2 puffs into the lungs 2 (two) times daily. What changed: when to take this   insulin aspart 100 UNIT/ML injection Commonly known as: novoLOG Inject 4 Units into the skin 3 (three) times daily with meals.   insulin aspart 100 UNIT/ML injection Commonly known as: novoLOG Inject 0-9 Units into the skin 3 (three) times daily with meals. Do NOT hold if patient is NPO. Sensitive Scale.   insulin aspart 100 UNIT/ML injection Commonly known as: novoLOG Inject 0-5 Units into the skin at bedtime. Do NOT hold insulin if patient is NPO.   insulin glargine-yfgn 100 UNIT/ML injection Commonly known as: SEMGLEE  Inject 0.2 mLs (20 Units total) into the skin daily.   insulin glargine-yfgn 100 UNIT/ML injection Commonly known as: SEMGLEE Inject 0.06 mLs (6 Units total) into the skin daily. Administer dose at 7 pm nightly   ipratropium-albuterol 0.5-2.5 (3) MG/3ML Soln Commonly known as: DUONEB Take 3 mLs by nebulization every 6 (six) hours as needed.   metoprolol tartrate 50 MG tablet Commonly known as: LOPRESSOR Take 1 tablet (50 mg total) by mouth 2 (two) times daily.   montelukast 10 MG tablet Commonly known as: SINGULAIR Take 1 tablet (10 mg total) by mouth at bedtime.   multivitamin Liqd Take 15 mLs  by mouth daily.   OLANZapine zydis 5 MG disintegrating tablet Commonly known as: ZYPREXA Take 1 tablet (5 mg total) by mouth 2 (two) times daily.   polyethylene glycol 17 g packet Commonly known as: MIRALAX / GLYCOLAX Take 17 g by mouth daily.   senna 8.6 MG Tabs tablet Commonly known as: SENOKOT Take 1 tablet (8.6 mg total) by mouth 2 (two) times daily.       Allergies  Allergen Reactions   Erythromycin Other (See Comments)    Childhood reaction    Follow-up Information     Elbert Ewings, FNP. Schedule an appointment as soon as possible for a visit in 1 week(s).   Specialty: Nurse Practitioner Contact information: [address] [place] [id number] [phone]                  The results of significant diagnostics from this hospitalization (including imaging, microbiology, ancillary and laboratory) are listed below for reference.    Significant Diagnostic Studies: MR BRAIN W WO CONTRAST  Result Date: 04/01/2021 CLINICAL DATA:  Mental status change, persistent or worsening EXAM: MRI HEAD WITHOUT AND WITH CONTRAST TECHNIQUE: Multiplanar, multiecho pulse sequences of the brain and surrounding structures were obtained without and with intravenous contrast. CONTRAST:  63m GADAVIST GADOBUTROL 1 MMOL/ML IV SOLN COMPARISON:  Feb 02, 2021 FINDINGS: Brain: There is no acute infarction or intracranial hemorrhage. There is no intracranial mass, mass effect, or edema. There is no hydrocephalus or extra-axial fluid collection. Prominence of the ventricles and sulci with similar ventriculomegaly. Patchy and confluent areas of T2 hyperintensity in the supratentorial white matter are nonspecific but may reflect similar chronic microvascular ischemic changes. There is no abnormal enhancement. Vascular: Major vessel flow voids at the skull base are preserved. Skull and upper cervical spine: Normal marrow signal is preserved. Sinuses/Orbits: Paranasal sinuses are  aerated. Orbits are unremarkable. Other: Sella is unremarkable.  Patchy mastoid fluid opacification. IMPRESSION: No acute intracranial abnormality. No significant change since 02/02/2021 including stable ventricular enlargement that could reflect central volume loss or communicating/normal pressure hydrocephalus. Electronically Signed   By: PMacy MisM.D.   On: 04/01/2021 16:37   DG CHEST PORT 1 VIEW  Result Date: 04/21/2021 CLINICAL DATA:  Hypoxia EXAM: PORTABLE CHEST 1 VIEW COMPARISON:  Radiograph 03/30/2021 FINDINGS: The heart size and mediastinal contours are within normal limits.No focal airspace disease. No pleural effusion or pneumothorax.No acute osseous abnormality. Unchanged multiple old right rib injuries. IMPRESSION: No evidence of acute cardiopulmonary disease. Electronically Signed   By: JMaurine SimmeringM.D.   On: 04/21/2021 15:04   EEG adult  Result Date: 03/31/2021 YLora Havens MD     03/31/2021  5:51 PM Patient Name: MChastidy RankerMRN: [id number] Attending: PLora HavensReferring Physician/Provider: AErin Hearing NP Date: 03/31/2021 Duration: 25.32 mins Patient history: 54yo F  with ams, intermittent staring. EEG to evaluate for seizure. Level of alertness: Awake AEDs during EEG study: None Technical aspects: This EEG study was done with scalp electrodes positioned according to the 10-20 International system of electrode placement. Electrical activity was acquired at a sampling rate of _0  and reviewed with a high frequency filter of _1  and a low frequency filter of _2 . EEG data were recorded continuously and digitally stored. Description: No posterior dominant rhythm was seen. EEG showed continuous generalized polymorphic sharply contoured 3 to 6 Hz theta-delta slowing. Hyperventilation and photic stimulation were not performed.   ABNORMALITY - Continuous slow, generalized IMPRESSION: This study is suggestive of moderate diffuse encephalopathy, nonspecific  etiology. No seizures or definite epileptiform discharges were seen throughout the recording. Priyanka Barbra Sarks   Overnight EEG with video  Result Date: 04/01/2021 Lora Havens, MD     04/01/2021  1:45 PM Patient Name: Azariah Latendresse MRN: [id number] Epilepsy Attending: Lora Havens Referring Physician/Provider: Dr Zeb Comfort Duration: 03/31/2021 1556 to 04/01/2021 1148  Patient history: 55yo F with ams, intermittent staring. EEG to evaluate for seizure.  Level of alertness: Awake, asleep  AEDs during EEG study: None  Technical aspects: This EEG study was done with scalp electrodes positioned according to the 10-20 International system of electrode placement. Electrical activity was acquired at a sampling rate of _3  and reviewed with a high frequency filter of _4  and a low frequency filter of _5 . EEG data were recorded continuously and digitally stored.  Description: No posterior dominant rhythm was seen. Sleep was characterized by sleep spindles (12 to 14 Hz), maximal frontocentral region.  EEG showed continuous generalized polymorphic sharply contoured 3 to 6 Hz theta-delta slowing, at times with triphasic morphology. Hyperventilation and photic stimulation were not performed.   Parts of study were difficult to review due to significant electrode artifact (patient was pulling electrodes)  ABNORMALITY - Continuous slow, generalized  IMPRESSION: This study is suggestive of moderate diffuse encephalopathy, nonspecific etiology but could be secondary to toxic-metabolic causes.  No seizures or definite epileptiform discharges were seen throughout the recording.  Lora Havens   ECHOCARDIOGRAM COMPLETE  Result Date: 04/08/2021    ECHOCARDIOGRAM REPORT   Patient Name:   MIKYLAH ACKROYD Date of Exam: 04/08/2021 Medical Rec #:  [id number]            Height:       66.0 in Accession #:    [id number]           Weight:       153.9 lb Date of Birth:  [birth year]           BSA:          1.789 m  Patient Age:    50 years             BP:           120/62 mmHg Patient Gender: F                    HR:           79 bpm. Exam Location:  Inpatient Procedure: Color Doppler and Cardiac Doppler Indications:    Murmur  History:        Patient has no prior history of Echocardiogram examinations.                 COPD; Risk Factors:Diabetes. H/O cocaine abuse.  Sonographer:    Clayton Lefort RDCS (AE) Referring Phys:  [place] Comments: No IV for Definity use. IMPRESSIONS  1. Left ventricular ejection fraction, by estimation, is 60 to 65%. The left ventricle has normal function. The left ventricle has no regional wall motion abnormalities. There is mild concentric left ventricular hypertrophy. Left ventricular diastolic parameters are consistent with Grade I diastolic dysfunction (impaired relaxation). Elevated left atrial pressure.  2. Right ventricular systolic function is normal. The right ventricular size is normal. Tricuspid regurgitation signal is inadequate for assessing PA pressure.  3. The mitral valve is normal in structure. No evidence of mitral valve regurgitation. No evidence of mitral stenosis.  4. The aortic valve is normal in structure. Aortic valve regurgitation is not visualized. No aortic stenosis is present.  5. The inferior vena cava is normal in size with greater than 50% respiratory variability, suggesting right atrial pressure of 3 mmHg. FINDINGS  Left Ventricle: Left ventricular ejection fraction, by estimation, is 60 to 65%. The left ventricle has normal function. The left ventricle has no regional wall motion abnormalities. The left ventricular internal cavity size was normal in size. There is  mild concentric left ventricular hypertrophy. Left ventricular diastolic parameters are consistent with Grade I diastolic dysfunction (impaired relaxation). Elevated left atrial pressure. Right Ventricle: The right ventricular size is normal. No increase in right ventricular wall  thickness. Right ventricular systolic function is normal. Tricuspid regurgitation signal is inadequate for assessing PA pressure. Left Atrium: Left atrial size was normal in size. Right Atrium: Right atrial size was normal in size. Pericardium: There is no evidence of pericardial effusion. Mitral Valve: The mitral valve is normal in structure. Mild mitral annular calcification. No evidence of mitral valve regurgitation. No evidence of mitral valve stenosis. MV peak gradient, 3.6 mmHg. The mean mitral valve gradient is 2.0 mmHg. Tricuspid Valve: The tricuspid valve is normal in structure. Tricuspid valve regurgitation is not demonstrated. No evidence of tricuspid stenosis. Aortic Valve: The aortic valve is normal in structure. Aortic valve regurgitation is not visualized. No aortic stenosis is present. Aortic valve mean gradient measures 5.0 mmHg. Aortic valve peak gradient measures 9.5 mmHg. Aortic valve area, by VTI measures 2.18 cm. Pulmonic Valve: The pulmonic valve was normal in structure. Pulmonic valve regurgitation is not visualized. No evidence of pulmonic stenosis. Aorta: The aortic root is normal in size and structure. Venous: The inferior vena cava is normal in size with greater than 50% respiratory variability, suggesting right atrial pressure of 3 mmHg. IAS/Shunts: No atrial level shunt detected by color flow Doppler.  LEFT VENTRICLE PLAX 2D LVIDd:         3.60 cm  Diastology LVIDs:         2.40 cm  LV e' medial:    5.12 cm/s LV PW:         1.30 cm  LV E/e' medial:  14.6 LV IVS:        1.40 cm  LV e' lateral:   4.56 cm/s LVOT diam:     1.80 cm  LV E/e' lateral: 16.4 LV SV:         68 LV SV Index:   38 LVOT Area:     2.54 cm  RIGHT VENTRICLE RV Basal diam:  2.90 cm RV S prime:     9.12 cm/s LEFT ATRIUM             Index       RIGHT ATRIUM           Index LA diam:  2.70 cm 1.51 cm/m  RA Area:     10.20 cm LA Vol (A2C):   32.5 ml 18.17 ml/m RA Volume:   21.40 ml  11.96 ml/m LA Vol (A4C):   23.8  ml 13.30 ml/m LA Biplane Vol: 29.8 ml 16.66 ml/m  AORTIC VALVE AV Area (Vmax):    2.05 cm AV Area (Vmean):   2.10 cm AV Area (VTI):     2.18 cm AV Vmax:           154.00 cm/s AV Vmean:          111.000 cm/s AV VTI:            0.314 m AV Peak Grad:      9.5 mmHg AV Mean Grad:      5.0 mmHg LVOT Vmax:         124.00 cm/s LVOT Vmean:        91.400 cm/s LVOT VTI:          0.269 m LVOT/AV VTI ratio: 0.86  AORTA Ao Root diam: 2.80 cm Ao Asc diam:  3.00 cm MITRAL VALVE               TRICUSPID VALVE MV Area (PHT): 2.95 cm    TR Peak grad:   6.1 mmHg MV Area VTI:   2.56 cm    TR Vmax:        123.00 cm/s MV Peak grad:  3.6 mmHg MV Mean grad:  2.0 mmHg    SHUNTS MV Vmax:       0.95 m/s    Systemic VTI:  0.27 m MV Vmean:      65.7 cm/s   Systemic Diam: 1.80 cm MV Decel Time: 257 msec MV E velocity: 74.80 cm/s MV A velocity: 86.40 cm/s MV E/A ratio:  0.87 Mihai Croitoru MD Electronically signed by Sanda Klein MD Signature Date/Time: 04/08/2021/5:25:58 PM    Final     Microbiology: Recent Results (from the past 240 hour(s))  Resp Panel by RT-PCR (Flu A&B, Covid) Nasopharyngeal Swab     Status: None   Collection Time: 04/29/21 12:43 PM   Specimen: Nasopharyngeal Swab; Nasopharyngeal(NP) swabs in vial transport medium  Result Value Ref Range Status   SARS Coronavirus 2 by RT PCR NEGATIVE NEGATIVE Final    Comment: (NOTE) SARS-CoV-2 target nucleic acids are NOT DETECTED.  The SARS-CoV-2 RNA is generally detectable in upper respiratory specimens during the acute phase of infection. The lowest concentration of SARS-CoV-2 viral copies this assay can detect is 138 copies/mL. A negative result does not preclude SARS-Cov-2 infection and should not be used as the sole basis for treatment or other patient management decisions. A negative result may occur with  improper specimen collection/handling, submission of specimen other than nasopharyngeal swab, presence of viral mutation(s) within the areas targeted by  this assay, and inadequate number of viral copies(<138 copies/mL). A negative result must be combined with clinical observations, patient history, and epidemiological information. The expected result is Negative.  Fact Sheet for Patients:  EntrepreneurPulse.com.au  Fact Sheet for Healthcare Providers:  IncredibleEmployment.be  This test is no t yet approved or cleared by the [place] FDA and  has been authorized for detection and/or diagnosis of SARS-CoV-2 by FDA under an Emergency Use Authorization (EUA). This EUA will remain  in effect (meaning this test can be used) for the duration of the COVID-19 declaration under Section 564(b)(1) of the Act, 21 U.S.C.section 360bbb-3(b)(1), unless the authorization is terminated  or  revoked sooner.       Influenza A by PCR NEGATIVE NEGATIVE Final   Influenza B by PCR NEGATIVE NEGATIVE Final    Comment: (NOTE) The Xpert Xpress SARS-CoV-2/FLU/RSV plus assay is intended as an aid in the diagnosis of influenza from Nasopharyngeal swab specimens and should not be used as a sole basis for treatment. Nasal washings and aspirates are unacceptable for Xpert Xpress SARS-CoV-2/FLU/RSV testing.  Fact Sheet for Patients: EntrepreneurPulse.com.au  Fact Sheet for Healthcare Providers: IncredibleEmployment.be  This test is not yet approved or cleared by the [place] FDA and has been authorized for detection and/or diagnosis of SARS-CoV-2 by FDA under an Emergency Use Authorization (EUA). This EUA will remain in effect (meaning this test can be used) for the duration of the COVID-19 declaration under Section 564(b)(1) of the Act, 21 U.S.C. section 360bbb-3(b)(1), unless the authorization is terminated or revoked.  Performed at [facility], [place] [address] [place], [place] [address]      Labs: Basic Metabolic Panel: No results for input(s): NA, K, CL, CO2,  GLUCOSE, BUN, CREATININE, CALCIUM, MG, PHOS in the last 168 hours. Liver Function Tests: No results for input(s): AST, ALT, ALKPHOS, BILITOT, PROT, ALBUMIN in the last 168 hours. No results for input(s): LIPASE, AMYLASE in the last 168 hours. No results for input(s): AMMONIA in the last 168 hours. CBC: No results for input(s): WBC, NEUTROABS, HGB, HCT, MCV, PLT in the last 168 hours. Cardiac Enzymes: No results for input(s): CKTOTAL, CKMB, CKMBINDEX, TROPONINI in the last 168 hours. BNP: BNP (last 3 results) Recent Labs    03/24/21 0856 03/25/21 0203 03/26/21 0222  BNP 29.2 33.9 37.9    ProBNP (last 3 results) No results for input(s): PROBNP in the last 8760 hours.  CBG: Recent Labs  Lab 04/29/21 0748 04/29/21 0851 04/29/21 1144 04/29/21 1604 04/29/21 1942  GLUCAP 129* 313* 363* 142* 233*       Signed:  Erin Hearing ANP [facility] 04/30/2021, 7:28 AM

## 2021-04-29 NOTE — TOC Progression Note (Signed)
Transition of Care [facility]) - Progression Note    Patient Details  Name: Carolyn Norton MRN: [id number] Date of Birth: 19-Jun-1966  [facility] CM/SW Contact  Janae Bridgeman, RN Phone Number: 04/29/2021, 9:50 AM  Clinical Narrative:    Case management spoke with [name], CM at [facility] facility and the patient has been accepted to the facility for admission once they are able to review the patient's contract for admission today.  I spoke with [name], NP and she is aware that admission is pending at the facility.  COVID screen was ordered and is to be completed by the bedside nurse this morning.  I called and spoke with the patient's mother, Arlenis Blaydes, on the phone and she is aware that the patient has been accepted at the facility.  I have the address and contact phone number to the facility.  The patient's mother was agreeable to having the patient receive a COVID booster before being admitted to the nursing facility.  Once [facility] is agreeable to the terms of the LOG contract for admission - then discharge summary would be completed by the medical team and [facility] staff would arrange for transfer to the facility.  CM and MSW will continue to follow the patient for admission to the facility.   Expected Discharge Plan: Hospice Medical Facility Barriers to Discharge: Inadequate or no insurance, Family Issues  Expected Discharge Plan and Services Expected Discharge Plan: Hospice Medical Facility In-house Referral: Clinical Social Work Discharge Planning Services: CM Consult Post Acute Care Choice: Skilled Nursing Facility Living arrangements for the past 2 months: Single Family Home Expected Discharge Date: 01/16/21               DME Arranged: [name] rolling DME Agency: [employer] Date DME Agency Contacted: 01/20/21 Time DME Agency Contacted: 1435 Representative spoke with at DME Agency: [name]              Social Determinants of Health (SDOH) Interventions    Readmission Risk Interventions Readmission Risk Prevention Plan 01/14/2021  Transportation Screening Complete  PCP or Specialist Appt within 3-5 Days (No Data)  Social Work Consult for Recovery Care Planning/Counseling Complete  Palliative Care Screening Not Applicable  Medication Review Oceanographer) Referral to Pharmacy  Some recent data might be hidden

## 2021-04-29 NOTE — Progress Notes (Signed)
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 -- ongoing  GOAL:  Patient will meet greater than or equal to 90% of their needs -- progressing  MONITOR:  PO intake, Supplement acceptance, Labs, Weight trends, TF tolerance  REASON FOR ASSESSMENT:  Consult Calorie Count, Poor PO  ASSESSMENT:  55 year old female past medical history of diabetes mellitus type 2, bipolar disorder type I, previous COVID infection, polysubstance abuse that includes cocaine.  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 Pt has been at [facility] for the past fifty-three days is not eating or drinking sufficiently. Palliative care has been asked to get re-involved to further discuss goals of care in the setting of progressive failure to thrive and generalized weakness.  7/04 - megace initiated 7/09 - megace dose increased to BID 7/15 - cortrak placed (tip gastric per xray) 7/23 - cortrak removed 7/25 repeat EEG with moderate diffuse encephalopathy though no evidence of seizures or other abnormality noted  Mental status/level of alertness continues to improve. Per CM/SW, pt has been accepted to a SNF; facility is reviewing the patient's contract for admission today. Per RN, pt is still not eating much but is doing fairly well with oral nutrition supplements still.   PO Intake: 5-90% x last 8 recorded meals (~37% average meal intake)  Medications:  (feeding supplement) PROSource Plus  30 mL Oral TID BM   amLODipine  5 mg Oral Daily   COVID-19 mRNA  Vac-TriS (Pfizer)  0.3 mL Intramuscular Once   docusate sodium  100 mg Oral BID   enoxaparin (LOVENOX) injection  40 mg Subcutaneous Q24H   feeding supplement (GLUCERNA SHAKE)  237 mL Oral TID BM   insulin aspart  0-5 Units Subcutaneous QHS   insulin aspart  0-9 Units Subcutaneous TID WC   insulin aspart  4 Units Subcutaneous TID WC   insulin glargine-yfgn  20 Units Subcutaneous Daily   insulin glargine-yfgn  6 Units Subcutaneous Q24H   metoprolol tartrate  50 mg Oral BID   multivitamin  15 mL Oral Daily   OLANZapine zydis  5 mg Oral BID   polyethylene glycol  17 g Oral Daily   senna  1 tablet Oral BID   Labs: No results for input(s): NA, K, CL, CO2, BUN, CREATININE, CALCIUM, MG, PHOS, GLUCOSE in the last 168 hours. CBGs [phone] (Diabetes Coordinator following)  UOP: x24 hours I/O: -16.8L since admit  Admit weight: 67.5 kg Current weight (last updated 04/21/21): 70 kg  Diet Order:   Diet Order             Diet regular Room service appropriate? Yes; Fluid consistency: Thin  Diet effective now                  EDUCATION NEEDS:  No education needs have been identified at this time  Skin:  Skin Assessment: Reviewed RN Assessment  Last BM:  8/22  Height:  Ht Readings from Last 1 Encounters:  03/15/21 5\' 6"  (1.676 m)   Weight:  Wt Readings from Last 1 Encounters:  04/21/21 70 kg   Ideal Body Weight:  59.1 kg  BMI:  Body mass index is 24.91 kg/m.  Estimated Nutritional Needs:  Kcal:  1850-2050 Protein:  90-110 grams Fluid:  >/= 1.8 L    Eugene Gavia, MS, RD, LDN (she/her/hers) RD pager number and weekend/on-call pager number located in Amion.

## 2021-04-29 NOTE — Progress Notes (Signed)
Physical Therapy Treatment Patient Details Name: Carolyn Norton MRN: [id number] DOB: September 21, 1965 Today's Date: 04/29/2021    History of Present Illness Pt is a 55 y.o. female initially admitted nearly 3 months ago on 01/11/21 with multiple falls, AMS, weakness. Workup for severe dehydration, DKA and metabolic encephalopathy. 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 7/1 underwent lumbar puncture and drained large volume of CSF to assess for potential improvement in symptomology (none noted);  7/2 MRI of the brain, cervical, thoracic and lumbar spine with no acute changes to explain LE weakness. PT signed off 7/12 secondary to lack of progress and further cognitive/functional decline. Cortrak placed 7/15 secondary to poor [email or web address]. Family declining comfort measures. On 8/2 patient with increased alertness per MD after beginning medication for anorexia and PT/OT reiniated. PMH includes DM2, COPD, CKD3, GERD, bipolar disorder, COVID-19, polysubstance abuse including cocaine.    PT Comments    Pt making extremely good progress this afternoon.  She was able to stand with min A and ambulated 23' with min A and chair follow. Needs frequent cues for safety. Goals were updated.    Follow Up Recommendations  SNF     Equipment Recommendations  Hospital bed;Wheelchair (measurements PT);Wheelchair cushion (measurements PT)    Recommendations for Other Services       Precautions / Restrictions Precautions Precautions: Fall Precaution Comments: High fall risk    Mobility  Bed Mobility Overal bed mobility: Needs Assistance Bed Mobility: Rolling Rolling: Min assist Sidelying to sit: Mod assist;HOB elevated;+2 for physical assistance       General bed mobility comments: in chair at arrival    Transfers Overall transfer level: Needs assistance Equipment used: Rolling walker (2 wheeled) Transfers: Sit to/from Stand Sit to Stand: Min assist;+2  safety/equipment Stand pivot transfers: +2 safety/equipment;Total assist       General transfer comment: Had STEDY present but pt with good initiation this afternoon and able to stand with min A.  Performed x 4 during therapy.  Cues for safety  Ambulation/Gait Ambulation/Gait assistance: Min assist Gait Distance (Feet): 23 Feet (23' then 10') Assistive device: Rolling walker (2 wheeled) Gait Pattern/deviations: Step-to pattern;Decreased stance time - left;Decreased stride length;Shuffle;Trunk flexed Gait velocity: decreased   General Gait Details: Pt ambulating with step to R pattern and tends to keep walker forward (can correct but not maintain).  Ambulated 23' took seated rest break and then 10' more but then reported fatigue. Had chair follow   Stairs             Wheelchair Mobility    Modified Rankin (Stroke Patients Only)       Balance Overall balance assessment: Needs assistance Sitting-balance support: No upper extremity supported Sitting balance-Leahy Scale: Fair Sitting balance - Comments: Min A progressing to Min guard to maintain static sitting at EOB with and without unilateral UE support. Assist for lateral scoot toward EOB. Postural control: Posterior lean Standing balance support: During functional activity;Bilateral upper extremity supported Standing balance-Leahy Scale: Poor Standing balance comment: Requiring UE support and min guard-min A                            Cognition Arousal/Alertness: Awake/alert Behavior During Therapy: WFL for tasks assessed/performed Overall Cognitive Status: Impaired/Different from baseline Area of Impairment: Attention;Following commands;Problem solving                 Orientation Level: Disoriented to;Time;Situation Current Attention Level: Sustained Memory: Decreased short-term  memory;Decreased recall of precautions Following Commands: Follows one step commands with increased  time Safety/Judgement: Decreased awareness of deficits;Decreased awareness of safety Awareness: Intellectual Problem Solving: Slow processing;Decreased initiation;Difficulty sequencing;Requires verbal cues;Requires tactile cues General Comments: Follows basic commands; frequent cues for safety ; able to communicate and make desires known      Exercises      General Comments General comments (skin integrity, edema, etc.): Requesting a soda - checked with tech who reports has had several and blood glucose elevated.  Provided water and tech checking with nurse about soda.      Pertinent Vitals/Pain Pain Assessment: No/denies pain Pain Intervention(s): Monitored during session    Home Living                      Prior Function            PT Goals (current goals can now be found in the care plan section) Acute Rehab PT Goals Patient Stated Goal: To return home with her mother/father. PT Goal Formulation: With patient Time For Goal Achievement: 05/13/21 Potential to Achieve Goals: Good Progress towards PT goals: Goals met and updated - see care plan    Frequency    Min 2X/week      PT Plan Current plan remains appropriate    Co-evaluation              AM-PAC PT "6 Clicks" Mobility   Outcome Measure  Help needed turning from your back to your side while in a flat bed without using bedrails?: A Little Help needed moving from lying on your back to sitting on the side of a flat bed without using bedrails?: A Little Help needed moving to and from a bed to a chair (including a wheelchair)?: A Little Help needed standing up from a chair using your arms (e.g., wheelchair or bedside chair)?: A Little Help needed to walk in hospital room?: A Lot Help needed climbing 3-5 steps with a railing? : A Lot 6 Click Score: 16    End of Session Equipment Utilized During Treatment: Gait belt Activity Tolerance: Patient tolerated treatment well Patient left: with chair  alarm set;in chair;with call bell/phone within reach (alarm - belt) Nurse Communication: Mobility status PT Visit Diagnosis: Muscle weakness (generalized) (M62.81);Repeated falls (R29.6);Difficulty in walking, not elsewhere classified (R26.2);Unsteadiness on feet (R26.81)     Time: 6222-9798 PT Time Calculation (min) (ACUTE ONLY): 19 min  Charges:  $Gait Training: 8-22 mins                     Abran Richard, PT Acute Rehab Services Pager [phone] [facility] [place] 04/29/2021, 3:19 PM

## 2021-04-30 LAB — GLUCOSE, CAPILLARY
Glucose-Capillary: 233 mg/dL — ABNORMAL HIGH (ref 70–99)
Glucose-Capillary: 193 mg/dL — ABNORMAL HIGH (ref 70–99)

## 2021-04-30 MED ORDER — INSULIN GLARGINE-YFGN 100 UNIT/ML ~~LOC~~ SOLN: 6.0000 [IU] | SUBCUTANEOUS | 11 refills | Status: DC

## 2021-04-30 NOTE — Plan of Care (Signed)
Pt is d/c'd to SNF

## 2021-04-30 NOTE — Progress Notes (Signed)
Pt d/c'd to SNF, All belongings sent with pt. All d/c instructions and medication instructions given and sent with pt.  Report given to SNF.

## 2021-04-30 NOTE — TOC Transition Note (Signed)
Transition of Care [facility]) - CM/SW Discharge Note   Patient Details  Name: Carolyn Norton MRN: [id number] Date of Birth: 03-May-1966  [facility] CM/SW Contact:  Janae Bridgeman, RN Phone Number: 04/30/2021, 7:49 AM   Clinical Narrative:    Case management spoke with [name], CM at [facility] in [place], [place] and the patient has been accepted for admission under an LOG that was sent to the facility yesterday and approved by the DON at the facility.  I called and notified the patient's mother on the phone and she is aware.  The patient will transfer to the facility today via PTAR and ambulance transport is arranged for 1100 today.    Discharged summary will be uploaded in the hub for the facility.  Medical necessity, facesheet, AVS and discharge summary will be included in the PTAR packet for transport.  Bedside nursing can call report the [facility] at [phone].   Final next level of care: Hospice Medical Facility [facility] Facility  / Comfort Care versus SNF placement) Barriers to Discharge: Inadequate or no insurance, Family Issues   Patient Goals and CMS Choice Patient states their goals for this hospitalization and ongoing recovery are:: Family is speaking with Palliative Care to discuss possible comfort care support. CMS Medicare.gov Compare Post Acute Care list provided to:: Patient Represenative (must comment) ([name]) Choice offered to / list presented to : Parent  Discharge Placement                       Discharge Plan and Services In-house Referral: Clinical Social Work Discharge Planning Services: CM Consult Post Acute Care Choice: Skilled Nursing Facility          DME Arranged: 3-N-1, Walker rolling DME Agency: [employer] Date DME Agency Contacted: 01/20/21 Time DME Agency Contacted: 1435 Representative spoke with at DME Agency: [name]            Social Determinants of Health (SDOH)  Interventions     Readmission Risk Interventions Readmission Risk Prevention Plan 01/14/2021  Transportation Screening Complete  PCP or Specialist Appt within 3-5 Days (No Data)  Social Work Consult for Recovery Care Planning/Counseling Complete  Palliative Care Screening Not Applicable  Medication Review Oceanographer) Referral to Pharmacy  Some recent data might be hidden

## 2021-05-05 DIAGNOSIS — G9341 Metabolic encephalopathy: Secondary | ICD-10-CM | POA: Diagnosis not present

## 2021-05-05 DIAGNOSIS — W19XXXA Unspecified fall, initial encounter: Secondary | ICD-10-CM | POA: Diagnosis not present

## 2021-05-06 DIAGNOSIS — R630 Anorexia: Secondary | ICD-10-CM | POA: Diagnosis not present

## 2021-05-06 DIAGNOSIS — E785 Hyperlipidemia, unspecified: Secondary | ICD-10-CM | POA: Diagnosis not present

## 2021-05-06 DIAGNOSIS — E1165 Type 2 diabetes mellitus with hyperglycemia: Secondary | ICD-10-CM | POA: Diagnosis not present

## 2021-05-06 DIAGNOSIS — N390 Urinary tract infection, site not specified: Secondary | ICD-10-CM | POA: Diagnosis not present

## 2021-05-09 DIAGNOSIS — R102 Pelvic and perineal pain: Secondary | ICD-10-CM | POA: Diagnosis not present

## 2021-05-12 DIAGNOSIS — E1165 Type 2 diabetes mellitus with hyperglycemia: Secondary | ICD-10-CM | POA: Diagnosis not present

## 2021-05-12 DIAGNOSIS — R4182 Altered mental status, unspecified: Secondary | ICD-10-CM | POA: Diagnosis not present

## 2021-05-12 DIAGNOSIS — N390 Urinary tract infection, site not specified: Secondary | ICD-10-CM | POA: Diagnosis not present

## 2021-05-12 DIAGNOSIS — N760 Acute vaginitis: Secondary | ICD-10-CM | POA: Diagnosis not present

## 2021-05-19 DIAGNOSIS — Z79899 Other long term (current) drug therapy: Secondary | ICD-10-CM | POA: Diagnosis not present

## 2021-05-19 DIAGNOSIS — R451 Restlessness and agitation: Secondary | ICD-10-CM | POA: Diagnosis not present

## 2021-05-26 DIAGNOSIS — Z79899 Other long term (current) drug therapy: Secondary | ICD-10-CM | POA: Diagnosis not present

## 2021-05-26 DIAGNOSIS — N1832 Chronic kidney disease, stage 3b: Secondary | ICD-10-CM | POA: Diagnosis not present

## 2021-05-26 DIAGNOSIS — R600 Localized edema: Secondary | ICD-10-CM | POA: Diagnosis not present

## 2021-05-27 DIAGNOSIS — Z79899 Other long term (current) drug therapy: Secondary | ICD-10-CM | POA: Diagnosis not present

## 2021-05-27 DIAGNOSIS — E1165 Type 2 diabetes mellitus with hyperglycemia: Secondary | ICD-10-CM | POA: Diagnosis not present

## 2021-05-28 DIAGNOSIS — N1832 Chronic kidney disease, stage 3b: Secondary | ICD-10-CM | POA: Diagnosis not present

## 2021-05-28 DIAGNOSIS — R600 Localized edema: Secondary | ICD-10-CM | POA: Diagnosis not present

## 2021-05-28 DIAGNOSIS — E1165 Type 2 diabetes mellitus with hyperglycemia: Secondary | ICD-10-CM | POA: Diagnosis not present

## 2021-05-28 DIAGNOSIS — J449 Chronic obstructive pulmonary disease, unspecified: Secondary | ICD-10-CM | POA: Diagnosis not present

## 2021-06-03 DIAGNOSIS — R600 Localized edema: Secondary | ICD-10-CM | POA: Diagnosis not present

## 2021-06-03 DIAGNOSIS — H8110 Benign paroxysmal vertigo, unspecified ear: Secondary | ICD-10-CM | POA: Diagnosis not present

## 2021-06-04 DIAGNOSIS — I129 Hypertensive chronic kidney disease with stage 1 through stage 4 chronic kidney disease, or unspecified chronic kidney disease: Secondary | ICD-10-CM | POA: Diagnosis not present

## 2021-06-09 DIAGNOSIS — R600 Localized edema: Secondary | ICD-10-CM | POA: Diagnosis not present

## 2021-06-10 DIAGNOSIS — E1165 Type 2 diabetes mellitus with hyperglycemia: Secondary | ICD-10-CM | POA: Diagnosis not present

## 2021-06-10 DIAGNOSIS — Z79899 Other long term (current) drug therapy: Secondary | ICD-10-CM | POA: Diagnosis not present

## 2021-06-10 DIAGNOSIS — Z794 Long term (current) use of insulin: Secondary | ICD-10-CM | POA: Diagnosis not present

## 2021-06-12 DIAGNOSIS — Z79899 Other long term (current) drug therapy: Secondary | ICD-10-CM | POA: Diagnosis not present

## 2021-06-17 DIAGNOSIS — G4719 Other hypersomnia: Secondary | ICD-10-CM | POA: Diagnosis not present

## 2021-06-17 DIAGNOSIS — R451 Restlessness and agitation: Secondary | ICD-10-CM | POA: Diagnosis not present

## 2021-06-17 DIAGNOSIS — Z79899 Other long term (current) drug therapy: Secondary | ICD-10-CM | POA: Diagnosis not present

## 2021-06-20 DIAGNOSIS — R600 Localized edema: Secondary | ICD-10-CM | POA: Diagnosis not present

## 2021-06-20 DIAGNOSIS — E1165 Type 2 diabetes mellitus with hyperglycemia: Secondary | ICD-10-CM | POA: Diagnosis not present

## 2021-06-20 DIAGNOSIS — Z79899 Other long term (current) drug therapy: Secondary | ICD-10-CM | POA: Diagnosis not present

## 2021-06-23 DIAGNOSIS — R630 Anorexia: Secondary | ICD-10-CM | POA: Diagnosis not present

## 2021-06-23 DIAGNOSIS — J449 Chronic obstructive pulmonary disease, unspecified: Secondary | ICD-10-CM | POA: Diagnosis not present

## 2021-06-23 DIAGNOSIS — R400 Somnolence: Secondary | ICD-10-CM | POA: Diagnosis not present

## 2021-06-23 DIAGNOSIS — R531 Weakness: Secondary | ICD-10-CM | POA: Diagnosis not present

## 2021-06-23 DIAGNOSIS — Z79899 Other long term (current) drug therapy: Secondary | ICD-10-CM | POA: Diagnosis not present

## 2021-06-24 DIAGNOSIS — G912 (Idiopathic) normal pressure hydrocephalus: Secondary | ICD-10-CM | POA: Diagnosis not present

## 2021-06-24 DIAGNOSIS — R400 Somnolence: Secondary | ICD-10-CM | POA: Diagnosis not present

## 2021-06-24 DIAGNOSIS — Z79899 Other long term (current) drug therapy: Secondary | ICD-10-CM | POA: Diagnosis not present

## 2021-07-10 DIAGNOSIS — M7989 Other specified soft tissue disorders: Secondary | ICD-10-CM | POA: Diagnosis not present

## 2021-07-10 DIAGNOSIS — M25531 Pain in right wrist: Secondary | ICD-10-CM | POA: Diagnosis not present

## 2021-07-16 DIAGNOSIS — E785 Hyperlipidemia, unspecified: Secondary | ICD-10-CM | POA: Diagnosis not present

## 2021-07-16 DIAGNOSIS — N1832 Chronic kidney disease, stage 3b: Secondary | ICD-10-CM | POA: Diagnosis not present

## 2021-07-16 DIAGNOSIS — G912 (Idiopathic) normal pressure hydrocephalus: Secondary | ICD-10-CM | POA: Diagnosis not present

## 2021-07-16 DIAGNOSIS — J449 Chronic obstructive pulmonary disease, unspecified: Secondary | ICD-10-CM | POA: Diagnosis not present

## 2021-07-17 DIAGNOSIS — E1165 Type 2 diabetes mellitus with hyperglycemia: Secondary | ICD-10-CM | POA: Diagnosis not present

## 2021-07-29 DIAGNOSIS — R030 Elevated blood-pressure reading, without diagnosis of hypertension: Secondary | ICD-10-CM | POA: Insufficient documentation

## 2021-07-29 DIAGNOSIS — G912 (Idiopathic) normal pressure hydrocephalus: Secondary | ICD-10-CM | POA: Diagnosis not present

## 2021-07-30 ENCOUNTER — Other Ambulatory Visit: Payer: Self-pay | Admitting: Neurosurgery

## 2021-07-30 DIAGNOSIS — G912 (Idiopathic) normal pressure hydrocephalus: Secondary | ICD-10-CM

## 2021-08-12 DIAGNOSIS — G912 (Idiopathic) normal pressure hydrocephalus: Secondary | ICD-10-CM | POA: Diagnosis not present

## 2021-08-12 DIAGNOSIS — E1165 Type 2 diabetes mellitus with hyperglycemia: Secondary | ICD-10-CM | POA: Diagnosis not present

## 2021-08-12 DIAGNOSIS — J449 Chronic obstructive pulmonary disease, unspecified: Secondary | ICD-10-CM | POA: Diagnosis not present

## 2021-08-12 DIAGNOSIS — Z794 Long term (current) use of insulin: Secondary | ICD-10-CM | POA: Diagnosis not present

## 2021-08-22 ENCOUNTER — Ambulatory Visit
Admission: RE | Admit: 2021-08-22 | Discharge: 2021-08-22 | Disposition: A | Discharge status: Home / Self Care | Payer: Medicaid Other | Source: Ambulatory Visit | Attending: Neurosurgery | Admitting: Neurosurgery

## 2021-08-22 ENCOUNTER — Other Ambulatory Visit: Payer: Self-pay

## 2021-08-22 DIAGNOSIS — G912 (Idiopathic) normal pressure hydrocephalus: Secondary | ICD-10-CM

## 2021-08-25 DIAGNOSIS — R062 Wheezing: Secondary | ICD-10-CM | POA: Diagnosis not present

## 2021-08-25 DIAGNOSIS — R0981 Nasal congestion: Secondary | ICD-10-CM | POA: Diagnosis not present

## 2021-08-25 DIAGNOSIS — R0989 Other specified symptoms and signs involving the circulatory and respiratory systems: Secondary | ICD-10-CM | POA: Diagnosis not present

## 2021-08-25 DIAGNOSIS — B348 Other viral infections of unspecified site: Secondary | ICD-10-CM | POA: Diagnosis not present

## 2021-08-28 DIAGNOSIS — R062 Wheezing: Secondary | ICD-10-CM | POA: Diagnosis not present

## 2021-08-28 DIAGNOSIS — R0989 Other specified symptoms and signs involving the circulatory and respiratory systems: Secondary | ICD-10-CM | POA: Diagnosis not present

## 2021-08-28 DIAGNOSIS — Z79899 Other long term (current) drug therapy: Secondary | ICD-10-CM | POA: Diagnosis not present

## 2021-08-28 DIAGNOSIS — R4182 Altered mental status, unspecified: Secondary | ICD-10-CM | POA: Diagnosis not present

## 2021-08-28 DIAGNOSIS — B348 Other viral infections of unspecified site: Secondary | ICD-10-CM | POA: Diagnosis not present

## 2021-09-16 ENCOUNTER — Ambulatory Visit (INDEPENDENT_AMBULATORY_CARE_PROVIDER_SITE_OTHER): Payer: Medicaid Other | Admitting: Neurology

## 2021-09-16 ENCOUNTER — Other Ambulatory Visit: Payer: Self-pay

## 2021-09-16 ENCOUNTER — Encounter: Payer: Self-pay | Admitting: Neurology

## 2021-09-16 VITALS — BP 104/73 | HR 76 | Ht 66.0 in

## 2021-09-16 DIAGNOSIS — R4189 Other symptoms and signs involving cognitive functions and awareness: Secondary | ICD-10-CM | POA: Diagnosis not present

## 2021-09-16 DIAGNOSIS — G912 (Idiopathic) normal pressure hydrocephalus: Secondary | ICD-10-CM

## 2021-09-16 DIAGNOSIS — G6281 Critical illness polyneuropathy: Secondary | ICD-10-CM

## 2021-09-16 DIAGNOSIS — G6289 Other specified polyneuropathies: Secondary | ICD-10-CM | POA: Diagnosis not present

## 2021-09-16 DIAGNOSIS — R531 Weakness: Secondary | ICD-10-CM | POA: Diagnosis not present

## 2021-09-16 DIAGNOSIS — G7281 Critical illness myopathy: Secondary | ICD-10-CM | POA: Diagnosis not present

## 2021-09-16 NOTE — Progress Notes (Signed)
[facility]  PATIENT: Carolyn Norton DOB: [birth year]  REQUESTING CLINICIAN: Kary Kos, MD HISTORY FROM: Patient and chart review  REASON FOR VISIT: NPH/Encephalopathy and physical deconditioning   HISTORICAL  CHIEF COMPLAINT:  Chief Complaint  Patient presents with   New Patient (Initial Visit)    Rm 12. Accompanied by [name], CNA. NP/Paper/[name]/Normal pressure hydrocephalus    HISTORY OF PRESENT ILLNESS:  This is a 56 year old woman with multiple medical conditions including bipolar type I disorder, generalized weakness, hypertension, type type 2 diabetes, hyperlipidemia, NPH, history of polysubstance abuse including cocaine who was recently admitted to the hospital from May 03/2021 and discharged on April 30, 2021 to a skilled nursing facility.  She is presenting today with her CNA named [name].  Patient reported that she does not remember anything from her hospitalization.  Per [name] since being in the nursing home her condition is getting worse, she is declining.  Initially she was noted to be able to get out of bed by on her own, she was able to walk independently but now patient requires 2 person assist to get out of the bed and she is unable to walk independently.  Patient cannot provide additional history, she said that she is not getting any physical therapy at the facility and she spent most of the time sitting down or laying down.   She saw Dr. Saintclair Halsted neurosurgery on July 29, 2021 for follow-up for her diagnosis of NPH.  Patient did have 2 large-volume taps in the hospital. There is a neurology progress note dated 03/07/2021 that reports improvement in cognition and functional status following large volume CSF drainage.  Dr. Saintclair Halsted refer the patient back to neurology for additional evaluation for alternative causes of her current neurocognitive deficits other than NPH.   Discharge summary below  56 year old female past medical  history of diabetes mellitus type 2, bipolar disorder type I, previous COVID infection, polysubstance abuse that includes cocaine.  She presented from home where she lives with her parents.  Her presenting symptoms included generalized weakness, multiple falls and confusion.  Family confirmed that she had been having the symptoms for at least a month with last use of crack cocaine 1 month prior to presentation.  Ever since that time she had not been doing well.  She would also be having issues with hyperglycemia.  In the ER she was diagnosed with severe dehydration, hyperglycemia and metabolic encephalopathy.   Since admission patient has been evaluated by the psychiatric team who has deemed her alert and oriented and having capacity to make decisions.  Patient is agreeable to placement at a rehab.  Currently she has severe physical deconditioning which was POA and PT is recommending SNF.  Because of her history of polysubstance abuse been difficult to obtain an SNF bed.  Initially her family was agreeable to taking her back in home when she is able to ambulate well enough to get back and forth for meals into the bathroom.  Unfortunately her cognitive and physical status has progressively declined.  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  Palliative medicine assisted but have signed off. Patient's previous advanced directives stated she did not want a PEG tube if she were unable to eat. Unfortunately no improvement with short term enteral feeds. Cortrak dc'd. Famliy not ready for comfort measures.    Beginning on 8/2 patient had improved mentation.  Likely 2/2 recent addition  of Zyprexa.  As of 8/19 therapies document patient continuing to improve although still requires max assist for most activities and basic functions.  She also still requires assistance with meal prep and eats better if actually fed.  OTHER MEDICAL  CONDITIONS: HtTN, DM, HLD, memory problem, substance abuse   REVIEW OF SYSTEMS: Full 14 system review of systems performed and negative with exception of: as noted in the HPI   ALLERGIES: Allergies  Allergen Reactions   Erythromycin Other (See Comments)    Childhood reaction    HOME MEDICATIONS: Outpatient Medications Prior to Visit  Medication Sig Dispense Refill   acetaminophen (TYLENOL) 325 MG tablet Take 2 tablets (650 mg total) by mouth every 6 (six) hours as needed for mild pain, fever or headache.     albuterol (VENTOLIN HFA) 108 (90 Base) MCG/ACT inhaler Inhale 2 puffs into the lungs every 4 (four) hours as needed for wheezing or shortness of breath. 1 each 3   amLODipine (NORVASC) 5 MG tablet Take 1 tablet (5 mg total) by mouth daily.     atorvastatin (LIPITOR) 20 MG tablet Take 1 tablet (20 mg total) by mouth daily. 90 tablet 1   bisacodyl (DULCOLAX) 10 MG suppository Place 1 suppository (10 mg total) rectally daily as needed for moderate constipation or severe constipation. 12 suppository 0   docusate sodium (COLACE) 100 MG capsule Take 1 capsule (100 mg total) by mouth 2 (two) times daily. 10 capsule 0   feeding supplement, GLUCERNA SHAKE, (GLUCERNA SHAKE) LIQD Take 237 mLs by mouth 3 (three) times daily between meals.  0   insulin aspart (NOVOLOG) 100 UNIT/ML injection Inject 4 Units into the skin 3 (three) times daily with meals. 10 mL 11   insulin aspart (NOVOLOG) 100 UNIT/ML injection Inject 0-9 Units into the skin 3 (three) times daily with meals. Do NOT hold if patient is NPO. Sensitive Scale. 10 mL 11   insulin aspart (NOVOLOG) 100 UNIT/ML injection Inject 0-5 Units into the skin at bedtime. Do NOT hold insulin if patient is NPO. 10 mL 11   insulin glargine-yfgn (SEMGLEE) 100 UNIT/ML injection Inject 0.2 mLs (20 Units total) into the skin daily. 10 mL 11   insulin glargine-yfgn (SEMGLEE) 100 UNIT/ML injection Inject 0.06 mLs (6 Units total) into the skin daily.  Administer dose at 7 pm nightly 10 mL 11   ipratropium-albuterol (DUONEB) 0.5-2.5 (3) MG/3ML SOLN Take 3 mLs by nebulization every 6 (six) hours as needed. 360 mL    metoprolol tartrate (LOPRESSOR) 50 MG tablet Take 1 tablet (50 mg total) by mouth 2 (two) times daily.     mometasone-formoterol (DULERA) 200-5 MCG/ACT AERO Inhale 2 puffs into the lungs 2 (two) times daily. 1 each    Multiple Vitamin (MULTIVITAMIN) LIQD Take 15 mLs by mouth daily.     Nutritional Supplements (,FEEDING SUPPLEMENT, PROSOURCE PLUS) liquid Take 30 mLs by mouth 3 (three) times daily between meals.     OLANZapine zydis (ZYPREXA) 5 MG disintegrating tablet Take 1 tablet (5 mg total) by mouth 2 (two) times daily.     polyethylene glycol (MIRALAX / GLYCOLAX) 17 g packet Take 17 g by mouth daily. 14 each 0   senna (SENOKOT) 8.6 MG TABS tablet Take 1 tablet (8.6 mg total) by mouth 2 (two) times daily. 120 tablet 0   montelukast (SINGULAIR) 10 MG tablet Take 1 tablet (10 mg total) by mouth at bedtime. 90 tablet 1   No facility-administered medications prior to visit.  PAST MEDICAL HISTORY: Past Medical History:  Diagnosis Date   Anxiety    Asthma    COVID    History of   Depression    Diabetes mellitus without complication (HCC)    Hyperlipidemia    Hypertension    Seasonal allergies     PAST SURGICAL HISTORY: Past Surgical History:  Procedure Laterality Date   BREAST EXCISIONAL BIOPSY Right    BREAST SURGERY     right breast    FAMILY HISTORY: Family History  Problem Relation Age of Onset   Hypertension Mother    Cancer Mother    Kidney cancer Mother    Cancer Father        prostate   Breast cancer Paternal Grandmother    Leukemia Brother     SOCIAL HISTORY: Social History   Socioeconomic History   Marital status: Single    Spouse name: Not on file   Number of children: 0   Years of education: Not on file   Highest education level: High school graduate  Occupational History   Not on file   Tobacco Use   Smoking status: Every Day    Packs/day: 0.50    Types: Cigarettes   Smokeless tobacco: Never  Vaping Use   Vaping Use: Never used  Substance and Sexual Activity   Alcohol use: No   Drug use: Yes    Types: Cocaine   Sexual activity: Not Currently    Birth control/protection: None  Other Topics Concern   Not on file  Social History Narrative   Not on file   Social Determinants of Health   Financial Resource Strain: Not on file  Food Insecurity: Not on file  Transportation Needs: Not on file  Physical Activity: Not on file  Stress: Not on file  Social Connections: Not on file  Intimate Partner Violence: Not on file    PHYSICAL EXAM  GENERAL EXAM/CONSTITUTIONAL: Vitals:  Vitals:   09/16/21 0943  BP: 104/73  Pulse: 76  Height: 5' 6"  (1.676 m)   Body mass index is 24.91 kg/m. Wt Readings from Last 3 Encounters:  04/21/21 154 lb 5.2 oz (70 kg)  11/21/20 188 lb 7.9 oz (85.5 kg)  10/22/20 188 lb 7.9 oz (85.5 kg)   Patient is in no distress; in a wheelchair  CARDIOVASCULAR: Examination of carotid arteries is normal; no carotid bruits Regular rate and rhythm, no murmurs Examination of peripheral vascular system by observation and palpation is normal  EYES: Pupils round and reactive to light, Visual fields full to confrontation, Extraocular movements intacts,   MUSCULOSKELETAL: Gait, strength, tone, movements noted in Neurologic exam below  NEUROLOGIC: MENTAL STATUS:  MMSE - [place] Exam 01/18/2021  Orientation to time 5  Orientation to Place 5  Registration 3  Attention/ Calculation 5  Recall 3  Language- name 2 objects 2  Language- repeat 1  Language- follow 3 step command 3  Language- read & follow direction 1  Write a sentence 1  Copy design 0  Total score 29   awake, alert, oriented to person, not place or time Able to states the last 2 [place] presidents, but cannot recall recent hospitalization  Report 6 quarters in $1.75,  unable to states the day of week backward but able to tell them forward  language fluent, comprehension intact, naming intact  CRANIAL NERVE:  2nd, 3rd, 4th, 6th - pupils equal and reactive to light, visual fields full to confrontation, extraocular muscles intact, no nystagmus 5th -  facial sensation symmetric 7th - facial strength symmetric 8th - hearing intact 9th - palate elevates symmetrically, uvula midline 11th - shoulder shrug symmetric 12th - tongue protrusion midline  MOTOR:  normal bulk and tone, strength in the BUE 4/5 in elbow flexion/extension, 4-/5 shoulder abduction bilaterally, hand gip 4-/5         Bilateral hip flexion 3/5, knee extension 3/5, plantar/dorsiflexion 4/5   SENSORY:  normal and symmetric to light touch, pinprick, decrease vibration in the BLE up to knees  COORDINATION:  finger-nose-finger, fine finger movements normal  REFLEXES:  deep tendon reflexes present and symmetric  GAIT/STATION:  Deferred, patient is wheelchair bound. She attempted to stand by herself but could not      DIAGNOSTIC DATA (LABS, IMAGING, TESTING) - I reviewed patient records, labs, notes, testing and imaging myself where available.  Lab Results  Component Value Date   WBC 11.0 (H) 09/16/2021   HGB 13.4 09/16/2021   HCT 41.9 09/16/2021   MCV 87 09/16/2021   PLT 394 09/16/2021      Component Value Date/Time   NA 138 09/16/2021 1042   K 4.4 09/16/2021 1042   CL 99 09/16/2021 1042   CO2 25 09/16/2021 1042   GLUCOSE 235 (H) 09/16/2021 1042   GLUCOSE 227 (H) 04/21/2021 1448   BUN 16 09/16/2021 1042   CREATININE 0.77 09/16/2021 1042   CALCIUM 9.9 09/16/2021 1042   PROT 6.5 09/16/2021 1042   ALBUMIN 3.9 09/16/2021 1042   AST 12 09/16/2021 1042   ALT 12 09/16/2021 1042   ALKPHOS 76 09/16/2021 1042   BILITOT 0.5 09/16/2021 1042   GFRNONAA >60 04/21/2021 1448   GFRAA >60 12/12/2017 0408   No results found for: CHOL, HDL, LDLCALC, LDLDIRECT, TRIG, CHOLHDL Lab  Results  Component Value Date   HGBA1C 8.8 (H) 03/20/2021   Lab Results  Component Value Date   [id number] 02/27/2021   Lab Results  Component Value Date   TSH 1.750 01/25/2021   MRI Brain with and without contrast 04/01/2021 No acute intracranial abnormality. No significant change since 02/02/2021 including stable ventricular enlargement that could reflect central volume loss or communicating/normal pressure hydrocephalus.  Head CT 08/23/2021 Stable to minimally increased lateral ventriculomegaly with features implicating normal pressure hydrocephalus. There is also degree of cerebral volume loss which is greater than expected for age.  EEG 04/01/2021 This study is suggestive of moderate diffuse encephalopathy, nonspecific etiology but could be secondary to toxic-metabolic causes.  No seizures or definite epileptiform discharges were seen throughout the recording.    ASSESSMENT AND PLAN  56 y.o. year old female with multiple medical conditions including bipolar type I disorder, anxiety, depression, hypertension, hyperlipidemia, CKD 3B, uncontrolled diabetes and polysubstance abuse who is presenting following a long admission for encephalopathy, hyperglycemia, severe dehydration and physical deconditioning.  During her admission she also developed critical illness myopathy and neuropathy. Her brain imaging is suggestive for normal pressure hydrocephalus, she did have 2 large taps and there is report of improvement of her cognition and functional status following one of them.  She continues to report incontinence, states that sometimes she just cannot feel the urine coming out.  There is also generalized weakness on exam, inability to stand unassisted, and evidence of peripheral neuropathy.  Even though her brain imaging is suggestive for normal pressure hydrocephalus, patient current cognition and functional status is not solely explained by the NPH.  She is physically deconditioned, she  has evidence of encephalopathy on exam, cannot remember her  hospital stay, she is only oriented to self but not place or time.  She does follow commands, sometimes will need repetition and redirection.  She has critical illness myopathy and neuropathy that worsened her condition.  At this time I advised her that she will definitely benefit from physical therapy, she reported she is not getting any physical therapy at the SNF.  I will also do a neuropathy work-up including EMG and nerve conduction study to assess the extend of her myopathy and neuropathy.  Even though I do not believe patient current condition is due only to NPH, I do think she may benefit from a shunt because there was a report that patient cognition and functional status improved following a large-volume tap.   I will contact the patient and the nursing home to discuss labs and studies results and I will see her in 3 months for follow-up.     1. Generalized weakness   2. NPH (normal pressure hydrocephalus) ([place])   3. Cognitive impairment   4. 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 Critical illness myopathy      Patient Instructions  Aggressive physical therapy  Neuropathy and myopathy labs  EMG/NCS  Follow up in 3 months   Orders Placed This Encounter  Procedures   CBC with diff   CMP   MMA   Homocysteine   SPEP with IFE   ANA w/Reflex   SSA, SSB   ESR   CRP   GM1 IgG Autoantibodies   Hepatitis C antibody   Hepatitis B surface antigen   Hepatitis B surface antibody, qualitative   Hepatitis B core antibody, total   ANA w/Reflex   NCV with EMG(electromyography)    No orders of the defined types were placed in this encounter.   Return in about 3 months (around 12/15/2021).    Alric Ran, MD 09/17/2021, 9:53 AM  [facility] [address], [place], [place] [address] [phone]

## 2021-09-16 NOTE — Patient Instructions (Signed)
Aggressive physical therapy  Neuropathy and myopathy labs  EMG/NCS  Follow up in 3 months

## 2021-09-22 DIAGNOSIS — E785 Hyperlipidemia, unspecified: Secondary | ICD-10-CM | POA: Diagnosis not present

## 2021-09-22 DIAGNOSIS — N182 Chronic kidney disease, stage 2 (mild): Secondary | ICD-10-CM | POA: Diagnosis not present

## 2021-09-22 DIAGNOSIS — E1165 Type 2 diabetes mellitus with hyperglycemia: Secondary | ICD-10-CM | POA: Diagnosis not present

## 2021-09-22 DIAGNOSIS — J45909 Unspecified asthma, uncomplicated: Secondary | ICD-10-CM | POA: Diagnosis not present

## 2021-09-23 LAB — COMPREHENSIVE METABOLIC PANEL
ALT: 12 IU/L (ref 0–32)
AST: 12 IU/L (ref 0–40)
Albumin/Globulin Ratio: 1.5 (ref 1.2–2.2)
Albumin: 3.9 g/dL (ref 3.8–4.9)
Alkaline Phosphatase: 76 IU/L (ref 44–121)
BUN/Creatinine Ratio: 21 (ref 9–23)
BUN: 16 mg/dL (ref 6–24)
Bilirubin Total: 0.5 mg/dL (ref 0.0–1.2)
CO2: 25 mmol/L (ref 20–29)
Calcium: 9.9 mg/dL (ref 8.7–10.2)
Chloride: 99 mmol/L (ref 96–106)
Creatinine, Ser: 0.77 mg/dL (ref 0.57–1.00)
Globulin, Total: 2.6 g/dL (ref 1.5–4.5)
Glucose: 235 mg/dL — ABNORMAL HIGH (ref 70–99)
Potassium: 4.4 mmol/L (ref 3.5–5.2)
Sodium: 138 mmol/L (ref 134–144)
Total Protein: 6.5 g/dL (ref 6.0–8.5)
eGFR: 92 mL/min/{1.73_m2} (ref >59)

## 2021-09-23 LAB — CBC WITH DIFFERENTIAL/PLATELET
Basophils Absolute: 0.1 10*3/uL (ref 0.0–0.2)
Basos: 1 %
EOS (ABSOLUTE): 0.1 10*3/uL (ref 0.0–0.4)
Eos: 1 %
Hematocrit: 41.9 % (ref 34.0–46.6)
Hemoglobin: 13.4 g/dL (ref 11.1–15.9)
Immature Grans (Abs): 0 10*3/uL (ref 0.0–0.1)
Immature Granulocytes: 0 %
Lymphocytes Absolute: 3.7 10*3/uL — ABNORMAL HIGH (ref 0.7–3.1)
Lymphs: 34 %
MCH: 27.7 pg (ref 26.6–33.0)
MCHC: 32 g/dL (ref 31.5–35.7)
MCV: 87 fL (ref 79–97)
Monocytes Absolute: 1.1 10*3/uL — ABNORMAL HIGH (ref 0.1–0.9)
Monocytes: 10 %
Neutrophils Absolute: 6 10*3/uL (ref 1.4–7.0)
Neutrophils: 54 %
Platelets: 394 10*3/uL (ref 150–450)
RBC: 4.84 x10E6/uL (ref 3.77–5.28)
RDW: 13.7 % (ref 11.7–15.4)
WBC: 11 10*3/uL — ABNORMAL HIGH (ref 3.4–10.8)

## 2021-09-23 LAB — MULTIPLE MYELOMA PANEL, SERUM
Albumin SerPl Elph-Mcnc: 3.4 g/dL (ref 2.9–4.4)
Albumin/Glob SerPl: 1.1 (ref 0.7–1.7)
Alpha 1: 0.3 g/dL (ref 0.0–0.4)
Alpha2 Glob SerPl Elph-Mcnc: 0.8 g/dL (ref 0.4–1.0)
B-Globulin SerPl Elph-Mcnc: 0.9 g/dL (ref 0.7–1.3)
Gamma Glob SerPl Elph-Mcnc: 1.2 g/dL (ref 0.4–1.8)
Globulin, Total: 3.1 g/dL (ref 2.2–3.9)
IgA/Immunoglobulin A, Serum: 250 mg/dL (ref 87–352)
IgG (Immunoglobin G), Serum: 1134 mg/dL (ref 586–1602)
IgM (Immunoglobulin M), Srm: 75 mg/dL (ref 26–217)

## 2021-09-23 LAB — HEPATITIS B CORE ANTIBODY, TOTAL: Hep B Core Total Ab: NEGATIVE

## 2021-09-23 LAB — INTERPRETATION

## 2021-09-23 LAB — ANA W/REFLEX: ANA Titer 1: NEGATIVE

## 2021-09-23 LAB — METHYLMALONIC ACID, SERUM: Methylmalonic Acid: 103 nmol/L (ref 0–378)

## 2021-09-23 LAB — HEPATITIS B SURFACE ANTIBODY,QUALITATIVE: Hep B Surface Ab, Qual: NONREACTIVE

## 2021-09-23 LAB — SJOGREN'S SYNDROME ANTIBODS(SSA + SSB)
ENA SSA (RO) Ab: <0.2 AI (ref 0.0–0.9)
ENA SSB (LA) Ab: <0.2 AI (ref 0.0–0.9)

## 2021-09-23 LAB — HEPATITIS C ANTIBODY: Hep C Virus Ab: <0.1 s/co ratio (ref 0.0–0.9)

## 2021-09-23 LAB — HEPATITIS B SURFACE ANTIGEN: Hepatitis B Surface Ag: NEGATIVE

## 2021-09-23 LAB — GM1 IGG AUTOANTIBODIES: GM1 IgG Autoantibodies: <20 % (ref 0–30)

## 2021-09-23 LAB — HOMOCYSTEINE: Homocysteine: 7.9 umol/L (ref 0.0–14.5)

## 2021-09-23 LAB — SEDIMENTATION RATE: Sed Rate: 18 mm/hr (ref 0–40)

## 2021-09-23 LAB — C-REACTIVE PROTEIN: CRP: 1 mg/L (ref 0–10)

## 2021-10-01 ENCOUNTER — Encounter (INDEPENDENT_AMBULATORY_CARE_PROVIDER_SITE_OTHER): Payer: Medicaid Other | Admitting: Neurology

## 2021-10-01 ENCOUNTER — Ambulatory Visit (INDEPENDENT_AMBULATORY_CARE_PROVIDER_SITE_OTHER): Payer: Medicaid Other | Admitting: Neurology

## 2021-10-01 DIAGNOSIS — G6289 Other specified polyneuropathies: Secondary | ICD-10-CM

## 2021-10-01 DIAGNOSIS — R531 Weakness: Secondary | ICD-10-CM | POA: Diagnosis not present

## 2021-10-01 DIAGNOSIS — G6281 Critical illness polyneuropathy: Secondary | ICD-10-CM

## 2021-10-01 DIAGNOSIS — Z0289 Encounter for other administrative examinations: Secondary | ICD-10-CM

## 2021-10-01 DIAGNOSIS — G7281 Critical illness myopathy: Secondary | ICD-10-CM

## 2021-10-08 DIAGNOSIS — E785 Hyperlipidemia, unspecified: Secondary | ICD-10-CM | POA: Diagnosis not present

## 2021-10-08 DIAGNOSIS — N1832 Chronic kidney disease, stage 3b: Secondary | ICD-10-CM | POA: Diagnosis not present

## 2021-10-08 DIAGNOSIS — G912 (Idiopathic) normal pressure hydrocephalus: Secondary | ICD-10-CM | POA: Diagnosis not present

## 2021-10-08 DIAGNOSIS — J449 Chronic obstructive pulmonary disease, unspecified: Secondary | ICD-10-CM | POA: Diagnosis not present

## 2021-10-09 DIAGNOSIS — Z79899 Other long term (current) drug therapy: Secondary | ICD-10-CM | POA: Diagnosis not present

## 2021-10-09 DIAGNOSIS — E1165 Type 2 diabetes mellitus with hyperglycemia: Secondary | ICD-10-CM | POA: Diagnosis not present

## 2021-10-10 NOTE — Procedures (Signed)
Full Name: Carolyn Norton Gender: Female MRN #: [id number] Date of Birth: [birth year]    Visit Date: 10/01/2021 12:32 Age: 56 Years Examining Physician: Levert Feinstein, MD  Referring Physician: Windell Norfolk, MD Height: 5 feet 6 inch Patient History: 56 year old female with history of polysubstance abuse, diabetes, prolonged hospital stay in May 2022 after found she was confused, multiple fall, during the work-up, imaging study showed enlarged ventricle, there is a possibility of normal pressure hydrocephalus, but she was found to have persistent lower extremity weakness, sensory loss, is not ambulatory  Summary of the test: Nerve conduction study: Bilateral superficial sensory responses were absent.  Bilateral sural sensory response showed normal peak latency, with moderately decreased snap amplitude.  Left ulnar sensory and motor responses were normal.  Bilateral tibial motor responses showed normal distal latency, CMAP amplitude, conduction velocity.  Bilateral peroneal motor responses showed significantly decreased CMAP amplitude, slow conduction velocity.  Bilateral tibial, left ulnar F-wave latencies were within normal limit.  Electromyography:  Selected needle examination was performed at left tibialis and left vastus lateralis muscle only.  She could not tolerate more extensive needle examination.  The limited examination showed normal exertion activity, mixture of normal and polyphasic motor unit potential with fairly normal recruitment patterns.    Conclusion: This is a mildly abnormal study.  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  Limited needle examination showed no definite evidence of intrinsic muscle disease.  Current mild findings will certainly not explain her profound distal lower extremity weakness, gait abnormalities.   ------------------------------- Levert Feinstein  M.D. Ph.D.  [facility] [address]  [address] [place], [place] [address] Tel: [phone] Fax: [phone]  Verbal informed consent was obtained from the patient, patient was informed of potential risk of procedure, including bruising, bleeding, hematoma formation, infection, muscle weakness, muscle pain, numbness, among others.        MNC    Nerve / Sites Muscle Latency Ref. Amplitude Ref. Rel Amp Segments Distance Velocity Ref. Area    ms ms mV mV %  cm m/s m/s mVms  L Ulnar - ADM     Wrist ADM 3.1 ?3.3 8.2 ?6.0 100 Wrist - ADM 7   17.3     B.Elbow ADM 6.7  7.6  92.5 B.Elbow - Wrist 19 54 ?49 17.0     A.Elbow ADM 8.7  6.8  89.9 A.Elbow - B.Elbow 10 49 ?49 16.0  L Peroneal - EDB     Ankle EDB 8.2 ?6.5 0.2 ?2.0 100 Ankle - EDB 9   1.0     Fib head EDB 16.7  0.4  206 Fib head - Ankle 27 32 ?44 2.0     Pop fossa EDB 20.1  0.3  91.9 Pop fossa - Fib head 10 30 ?44 1.9         Pop fossa - Ankle      R Peroneal - EDB     Ankle EDB 5.0 ?6.5 0.6 ?2.0 100 Ankle - EDB 9   2.9     Fib head EDB 13.0  0.4  72.2 Fib head - Ankle 27 33 ?44 1.5     Pop fossa EDB 16.1  0.4  84.6 Pop fossa - Fib head 10 33 ?44 1.4         Pop fossa - Ankle      L Tibial - AH     Ankle AH 3.9 ?5.8 4.5 ?4.0 100 Ankle - AH  9   12.5     Pop fossa AH 12.5  3.5  79.5 Pop fossa - Ankle 36 42 ?41 10.5  R Tibial - AH     Ankle AH 4.3 ?5.8 5.7 ?4.0 100 Ankle - AH 9   13.4     Pop fossa AH 13.1  3.8  66.3 Pop fossa - Ankle 36 41 ?41 13.5               SNC    Nerve / Sites Rec. Site Peak Lat Ref.  Amp Ref. Segments Distance    ms ms V V  cm  L Sural - Ankle (Calf)     Calf Ankle 3.4 ?4.4 2 ?6 Calf - Ankle 14  R Sural - Ankle (Calf)     Calf Ankle 2.9 ?4.4 3 ?6 Calf - Ankle 14  L Superficial peroneal - Ankle     Lat leg Ankle NR ?4.4 NR ?6 Lat leg - Ankle 14  R Superficial peroneal - Ankle     Lat leg Ankle NR ?4.4 NR ?6 Lat leg - Ankle 14  L Ulnar - Orthodromic, (Dig V, Mid palm)     Dig V Wrist 2.8 ?3.1 5 ?5 Dig V - Wrist 73               F  Wave     Nerve F Lat Ref.   ms ms  L Tibial - AH 56.0 ?56.0  R Tibial - AH 55.7 ?56.0  L Ulnar - ADM 28.1 ?32.0           EMG Summary Table    Spontaneous MUAP Recruitment  Muscle IA Fib PSW Fasc Other Amp Dur. Poly Pattern  L. Tibialis anterior Normal None None None _______ Normal Normal 1+ Normal  L. Vastus lateralis Normal None None None _______ Normal Normal 1+ Normal

## 2021-10-28 DIAGNOSIS — J449 Chronic obstructive pulmonary disease, unspecified: Secondary | ICD-10-CM | POA: Diagnosis not present

## 2021-10-28 DIAGNOSIS — E785 Hyperlipidemia, unspecified: Secondary | ICD-10-CM | POA: Diagnosis not present

## 2021-10-28 DIAGNOSIS — N181 Chronic kidney disease, stage 1: Secondary | ICD-10-CM | POA: Diagnosis not present

## 2021-10-28 DIAGNOSIS — I10 Essential (primary) hypertension: Secondary | ICD-10-CM | POA: Diagnosis not present

## 2021-10-29 DIAGNOSIS — J449 Chronic obstructive pulmonary disease, unspecified: Secondary | ICD-10-CM | POA: Diagnosis not present

## 2021-10-29 DIAGNOSIS — R630 Anorexia: Secondary | ICD-10-CM | POA: Diagnosis not present

## 2021-10-29 DIAGNOSIS — N182 Chronic kidney disease, stage 2 (mild): Secondary | ICD-10-CM | POA: Diagnosis not present

## 2021-10-29 DIAGNOSIS — E1165 Type 2 diabetes mellitus with hyperglycemia: Secondary | ICD-10-CM | POA: Diagnosis not present

## 2021-11-05 DIAGNOSIS — I10 Essential (primary) hypertension: Secondary | ICD-10-CM | POA: Diagnosis not present

## 2021-11-05 DIAGNOSIS — G6281 Critical illness polyneuropathy: Secondary | ICD-10-CM | POA: Diagnosis not present

## 2021-11-05 DIAGNOSIS — N181 Chronic kidney disease, stage 1: Secondary | ICD-10-CM | POA: Diagnosis not present

## 2021-11-05 DIAGNOSIS — M25531 Pain in right wrist: Secondary | ICD-10-CM | POA: Diagnosis not present

## 2021-11-05 DIAGNOSIS — H8110 Benign paroxysmal vertigo, unspecified ear: Secondary | ICD-10-CM | POA: Diagnosis not present

## 2021-11-05 DIAGNOSIS — M6281 Muscle weakness (generalized): Secondary | ICD-10-CM | POA: Diagnosis not present

## 2021-11-05 DIAGNOSIS — R400 Somnolence: Secondary | ICD-10-CM | POA: Diagnosis not present

## 2021-11-05 DIAGNOSIS — F29 Unspecified psychosis not due to a substance or known physiological condition: Secondary | ICD-10-CM | POA: Diagnosis not present

## 2021-11-05 DIAGNOSIS — B348 Other viral infections of unspecified site: Secondary | ICD-10-CM | POA: Diagnosis not present

## 2021-11-05 DIAGNOSIS — N182 Chronic kidney disease, stage 2 (mild): Secondary | ICD-10-CM | POA: Diagnosis not present

## 2021-11-05 DIAGNOSIS — R0981 Nasal congestion: Secondary | ICD-10-CM | POA: Diagnosis not present

## 2021-11-05 DIAGNOSIS — E43 Unspecified severe protein-calorie malnutrition: Secondary | ICD-10-CM | POA: Diagnosis not present

## 2021-11-05 DIAGNOSIS — G912 (Idiopathic) normal pressure hydrocephalus: Secondary | ICD-10-CM | POA: Diagnosis not present

## 2021-11-05 DIAGNOSIS — R0989 Other specified symptoms and signs involving the circulatory and respiratory systems: Secondary | ICD-10-CM | POA: Diagnosis not present

## 2021-11-05 DIAGNOSIS — F3176 Bipolar disorder, in full remission, most recent episode depressed: Secondary | ICD-10-CM | POA: Diagnosis not present

## 2021-11-05 DIAGNOSIS — G6289 Other specified polyneuropathies: Secondary | ICD-10-CM | POA: Diagnosis not present

## 2021-11-05 DIAGNOSIS — E1165 Type 2 diabetes mellitus with hyperglycemia: Secondary | ICD-10-CM | POA: Diagnosis not present

## 2021-11-05 DIAGNOSIS — R062 Wheezing: Secondary | ICD-10-CM | POA: Diagnosis not present

## 2021-11-05 DIAGNOSIS — E785 Hyperlipidemia, unspecified: Secondary | ICD-10-CM | POA: Diagnosis not present

## 2021-11-05 DIAGNOSIS — I129 Hypertensive chronic kidney disease with stage 1 through stage 4 chronic kidney disease, or unspecified chronic kidney disease: Secondary | ICD-10-CM | POA: Diagnosis not present

## 2021-11-05 DIAGNOSIS — M7989 Other specified soft tissue disorders: Secondary | ICD-10-CM | POA: Diagnosis not present

## 2021-11-05 DIAGNOSIS — R531 Weakness: Secondary | ICD-10-CM | POA: Diagnosis not present

## 2021-11-05 DIAGNOSIS — F3132 Bipolar disorder, current episode depressed, moderate: Secondary | ICD-10-CM | POA: Diagnosis not present

## 2021-11-05 DIAGNOSIS — G300 Alzheimer's disease with early onset: Secondary | ICD-10-CM | POA: Diagnosis not present

## 2021-11-05 DIAGNOSIS — R600 Localized edema: Secondary | ICD-10-CM | POA: Diagnosis not present

## 2021-11-12 DIAGNOSIS — G912 (Idiopathic) normal pressure hydrocephalus: Secondary | ICD-10-CM | POA: Diagnosis not present

## 2021-11-12 DIAGNOSIS — R062 Wheezing: Secondary | ICD-10-CM | POA: Diagnosis not present

## 2021-11-12 DIAGNOSIS — R0981 Nasal congestion: Secondary | ICD-10-CM | POA: Diagnosis not present

## 2021-11-12 DIAGNOSIS — M7989 Other specified soft tissue disorders: Secondary | ICD-10-CM | POA: Diagnosis not present

## 2021-11-12 DIAGNOSIS — I129 Hypertensive chronic kidney disease with stage 1 through stage 4 chronic kidney disease, or unspecified chronic kidney disease: Secondary | ICD-10-CM | POA: Diagnosis not present

## 2021-11-12 DIAGNOSIS — N182 Chronic kidney disease, stage 2 (mild): Secondary | ICD-10-CM | POA: Diagnosis not present

## 2021-11-12 DIAGNOSIS — F29 Unspecified psychosis not due to a substance or known physiological condition: Secondary | ICD-10-CM | POA: Diagnosis not present

## 2021-11-12 DIAGNOSIS — G6289 Other specified polyneuropathies: Secondary | ICD-10-CM | POA: Diagnosis not present

## 2021-11-12 DIAGNOSIS — M6281 Muscle weakness (generalized): Secondary | ICD-10-CM | POA: Diagnosis not present

## 2021-11-12 DIAGNOSIS — R0989 Other specified symptoms and signs involving the circulatory and respiratory systems: Secondary | ICD-10-CM | POA: Diagnosis not present

## 2021-11-12 DIAGNOSIS — G6281 Critical illness polyneuropathy: Secondary | ICD-10-CM | POA: Diagnosis not present

## 2021-11-12 DIAGNOSIS — R600 Localized edema: Secondary | ICD-10-CM | POA: Diagnosis not present

## 2021-11-12 DIAGNOSIS — F3132 Bipolar disorder, current episode depressed, moderate: Secondary | ICD-10-CM | POA: Diagnosis not present

## 2021-11-12 DIAGNOSIS — E43 Unspecified severe protein-calorie malnutrition: Secondary | ICD-10-CM | POA: Diagnosis not present

## 2021-11-12 DIAGNOSIS — B348 Other viral infections of unspecified site: Secondary | ICD-10-CM | POA: Diagnosis not present

## 2021-11-12 DIAGNOSIS — G300 Alzheimer's disease with early onset: Secondary | ICD-10-CM | POA: Diagnosis not present

## 2021-11-12 DIAGNOSIS — R400 Somnolence: Secondary | ICD-10-CM | POA: Diagnosis not present

## 2021-11-12 DIAGNOSIS — H8110 Benign paroxysmal vertigo, unspecified ear: Secondary | ICD-10-CM | POA: Diagnosis not present

## 2021-11-12 DIAGNOSIS — R531 Weakness: Secondary | ICD-10-CM | POA: Diagnosis not present

## 2021-11-12 DIAGNOSIS — F3176 Bipolar disorder, in full remission, most recent episode depressed: Secondary | ICD-10-CM | POA: Diagnosis not present

## 2021-11-12 DIAGNOSIS — E1165 Type 2 diabetes mellitus with hyperglycemia: Secondary | ICD-10-CM | POA: Diagnosis not present

## 2021-11-12 DIAGNOSIS — N181 Chronic kidney disease, stage 1: Secondary | ICD-10-CM | POA: Diagnosis not present

## 2021-11-12 DIAGNOSIS — M25531 Pain in right wrist: Secondary | ICD-10-CM | POA: Diagnosis not present

## 2021-11-12 DIAGNOSIS — E785 Hyperlipidemia, unspecified: Secondary | ICD-10-CM | POA: Diagnosis not present

## 2021-11-12 DIAGNOSIS — I10 Essential (primary) hypertension: Secondary | ICD-10-CM | POA: Diagnosis not present

## 2021-11-17 DIAGNOSIS — E1165 Type 2 diabetes mellitus with hyperglycemia: Secondary | ICD-10-CM | POA: Diagnosis not present

## 2021-11-17 DIAGNOSIS — Z794 Long term (current) use of insulin: Secondary | ICD-10-CM | POA: Diagnosis not present

## 2021-11-17 DIAGNOSIS — K219 Gastro-esophageal reflux disease without esophagitis: Secondary | ICD-10-CM | POA: Diagnosis not present

## 2021-11-17 DIAGNOSIS — J45909 Unspecified asthma, uncomplicated: Secondary | ICD-10-CM | POA: Diagnosis not present

## 2021-11-19 DIAGNOSIS — M25531 Pain in right wrist: Secondary | ICD-10-CM | POA: Diagnosis not present

## 2021-11-19 DIAGNOSIS — G6289 Other specified polyneuropathies: Secondary | ICD-10-CM | POA: Diagnosis not present

## 2021-11-19 DIAGNOSIS — R600 Localized edema: Secondary | ICD-10-CM | POA: Diagnosis not present

## 2021-11-19 DIAGNOSIS — F29 Unspecified psychosis not due to a substance or known physiological condition: Secondary | ICD-10-CM | POA: Diagnosis not present

## 2021-11-19 DIAGNOSIS — G300 Alzheimer's disease with early onset: Secondary | ICD-10-CM | POA: Diagnosis not present

## 2021-11-19 DIAGNOSIS — E785 Hyperlipidemia, unspecified: Secondary | ICD-10-CM | POA: Diagnosis not present

## 2021-11-19 DIAGNOSIS — M6281 Muscle weakness (generalized): Secondary | ICD-10-CM | POA: Diagnosis not present

## 2021-11-19 DIAGNOSIS — N182 Chronic kidney disease, stage 2 (mild): Secondary | ICD-10-CM | POA: Diagnosis not present

## 2021-11-19 DIAGNOSIS — R0981 Nasal congestion: Secondary | ICD-10-CM | POA: Diagnosis not present

## 2021-11-19 DIAGNOSIS — F3132 Bipolar disorder, current episode depressed, moderate: Secondary | ICD-10-CM | POA: Diagnosis not present

## 2021-11-19 DIAGNOSIS — N181 Chronic kidney disease, stage 1: Secondary | ICD-10-CM | POA: Diagnosis not present

## 2021-11-19 DIAGNOSIS — R400 Somnolence: Secondary | ICD-10-CM | POA: Diagnosis not present

## 2021-11-19 DIAGNOSIS — R0989 Other specified symptoms and signs involving the circulatory and respiratory systems: Secondary | ICD-10-CM | POA: Diagnosis not present

## 2021-11-19 DIAGNOSIS — M7989 Other specified soft tissue disorders: Secondary | ICD-10-CM | POA: Diagnosis not present

## 2021-11-19 DIAGNOSIS — G912 (Idiopathic) normal pressure hydrocephalus: Secondary | ICD-10-CM | POA: Diagnosis not present

## 2021-11-19 DIAGNOSIS — I129 Hypertensive chronic kidney disease with stage 1 through stage 4 chronic kidney disease, or unspecified chronic kidney disease: Secondary | ICD-10-CM | POA: Diagnosis not present

## 2021-11-19 DIAGNOSIS — R062 Wheezing: Secondary | ICD-10-CM | POA: Diagnosis not present

## 2021-11-19 DIAGNOSIS — B348 Other viral infections of unspecified site: Secondary | ICD-10-CM | POA: Diagnosis not present

## 2021-11-19 DIAGNOSIS — F3176 Bipolar disorder, in full remission, most recent episode depressed: Secondary | ICD-10-CM | POA: Diagnosis not present

## 2021-11-19 DIAGNOSIS — H8110 Benign paroxysmal vertigo, unspecified ear: Secondary | ICD-10-CM | POA: Diagnosis not present

## 2021-11-19 DIAGNOSIS — E43 Unspecified severe protein-calorie malnutrition: Secondary | ICD-10-CM | POA: Diagnosis not present

## 2021-11-19 DIAGNOSIS — I10 Essential (primary) hypertension: Secondary | ICD-10-CM | POA: Diagnosis not present

## 2021-11-19 DIAGNOSIS — R531 Weakness: Secondary | ICD-10-CM | POA: Diagnosis not present

## 2021-11-19 DIAGNOSIS — G6281 Critical illness polyneuropathy: Secondary | ICD-10-CM | POA: Diagnosis not present

## 2021-11-19 DIAGNOSIS — E1165 Type 2 diabetes mellitus with hyperglycemia: Secondary | ICD-10-CM | POA: Diagnosis not present

## 2021-11-25 DIAGNOSIS — B348 Other viral infections of unspecified site: Secondary | ICD-10-CM | POA: Diagnosis not present

## 2021-11-25 DIAGNOSIS — Z79899 Other long term (current) drug therapy: Secondary | ICD-10-CM | POA: Diagnosis not present

## 2021-12-16 DIAGNOSIS — Z79899 Other long term (current) drug therapy: Secondary | ICD-10-CM | POA: Diagnosis not present

## 2021-12-16 DIAGNOSIS — E559 Vitamin D deficiency, unspecified: Secondary | ICD-10-CM | POA: Diagnosis not present

## 2021-12-16 DIAGNOSIS — E538 Deficiency of other specified B group vitamins: Secondary | ICD-10-CM | POA: Diagnosis not present

## 2021-12-23 ENCOUNTER — Ambulatory Visit: Payer: Medicaid Other | Admitting: Neurology

## 2021-12-23 ENCOUNTER — Encounter: Payer: Self-pay | Admitting: Neurology

## 2021-12-23 ENCOUNTER — Telehealth: Payer: Self-pay | Admitting: Neurology

## 2021-12-23 VITALS — BP 113/72 | HR 80 | Ht 66.0 in

## 2021-12-23 DIAGNOSIS — G7281 Critical illness myopathy: Secondary | ICD-10-CM

## 2021-12-23 DIAGNOSIS — R4189 Other symptoms and signs involving cognitive functions and awareness: Secondary | ICD-10-CM

## 2021-12-23 DIAGNOSIS — G912 (Idiopathic) normal pressure hydrocephalus: Secondary | ICD-10-CM

## 2021-12-23 DIAGNOSIS — G6289 Other specified polyneuropathies: Secondary | ICD-10-CM

## 2021-12-23 DIAGNOSIS — R531 Weakness: Secondary | ICD-10-CM

## 2021-12-23 DIAGNOSIS — G6281 Critical illness polyneuropathy: Secondary | ICD-10-CM | POA: Diagnosis not present

## 2021-12-23 NOTE — Telephone Encounter (Signed)
Left a voicemail with the PT department at [facility] for a call back to get their fax number to send the order.  ?

## 2021-12-23 NOTE — Progress Notes (Signed)
? ?[facility] ? ?PATIENT: Carolyn Norton ?DOB: [birth year] ? ?REQUESTING CLINICIAN: Elbert Ewings, FNP ?HISTORY FROM: Patient and chart review  ?REASON FOR VISIT: NPH/Encephalopathy and physical deconditioning ? ? ?HISTORICAL ? ?CHIEF COMPLAINT:  ?Chief Complaint  ?Patient presents with  ? Follow-up  ?  Rm 12. Accompanied by aunt. ?Aunt states she is unsure if physical therapy is being preformed at facility. Would like to discuss neuropathy.  ? ?INTERVAL HISTORY 12/23/2021:  ?Patient presents today for follow-up, she is accompanied by her aunt who provide additional history.  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  I still wanted her to be seen by Dr. Saintclair Halsted for shunt placement evaluation since it was documented that patient improved while in the hospital after large-volume tap.  Per aunt, patient has not follow-up with Dr. Saintclair Halsted.  She still confused, and she is not getting any physical therapy or minimally very little activity at her facility.  She still struggling with her memory, orientation, she still having urinary incontinence and difficulty walking.  Aunt also reports that patient cognition and gait improved in the hospital after large-volume tap.  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  Patient does have history of diabetes.   ? ? ?HISTORY OF PRESENT ILLNESS:  ?This is a 56 year old woman with multiple medical conditions including bipolar type I disorder, generalized weakness, hypertension, type type 2 diabetes, hyperlipidemia, NPH, history of polysubstance abuse including cocaine who was recently admitted to the hospital from May 03/2021 and discharged on April 30, 2021 to a skilled nursing facility.  She is presenting today with her CNA  named [name].  Patient reported that she does not remember anything from her hospitalization.  Per [name] since being in the nursing home her condition is getting worse, she is declining.  Initially she was noted to be able to get out of bed by on her own, she was able to walk independently but now patient requires 2 person assist to get out of the bed and she is unable to walk independently.  Patient cannot provide additional history, she said that she is not getting any physical therapy at the facility and she spent most of the time sitting down or laying down.   ?She saw Dr. Saintclair Halsted neurosurgery on July 29, 2021 for follow-up for her diagnosis of NPH.  Patient did have 2 large-volume taps in the hospital. There is a neurology progress note dated 03/07/2021 that reports improvement in cognition and functional status following large volume CSF drainage.  Dr. Saintclair Halsted refer the patient back to neurology for additional evaluation for alternative causes of her current neurocognitive deficits other than NPH. ? ? ?Discharge summary below  ?56 year old female past medical history of diabetes mellitus type 2, bipolar disorder type I, previous COVID infection, polysubstance abuse that includes cocaine.  She presented from home where she lives with her parents.  Her presenting symptoms included generalized weakness, multiple falls and confusion.  Family confirmed that she had been having the symptoms for at least a month with last use of crack cocaine 1 month prior to presentation.  Ever since that time she had not been doing well.  She would also be having issues with hyperglycemia.  In the ER she was diagnosed with severe dehydration, hyperglycemia and metabolic encephalopathy. ?  ?  Since admission patient has been evaluated by the psychiatric team who has deemed her alert and oriented and having capacity to make decisions.  Patient is agreeable to placement at a rehab.  Currently she has severe physical deconditioning which was  POA and PT is recommending SNF.  Because of her history of polysubstance abuse been difficult to obtain an SNF bed.  Initially her family was agreeable to taking her back in home when she is able to ambulate well enough to get back and forth for meals into the bathroom.  Unfortunately her cognitive and physical status has progressively declined.  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  Palliative medicine assisted but have signed off. Patient's previous advanced directives stated she did not want a PEG tube if she were unable to eat. Unfortunately no improvement with short term enteral feeds. Cortrak dc'd. Famliy not ready for comfort measures.  ?  ?Beginning on 8/2 patient had improved mentation.  Likely 2/2 recent addition of Zyprexa.  As of 8/19 therapies document patient continuing to improve although still requires max assist for most activities and basic functions.  She also still requires assistance with meal prep and eats better if actually fed. ? ?OTHER MEDICAL CONDITIONS: HtTN, DM, HLD, memory problem, substance abuse ? ? ?REVIEW OF SYSTEMS: Full 14 system review of systems performed and negative with exception of: as noted in the HPI  ? ?ALLERGIES: ?Allergies  ?Allergen Reactions  ? Erythromycin Other (See Comments)  ?  Childhood reaction  ? ? ?HOME MEDICATIONS: ?Outpatient Medications Prior to Visit  ?Medication Sig Dispense Refill  ? acetaminophen (TYLENOL) 325 MG tablet Take 2 tablets (650 mg total) by mouth every 6 (six) hours as needed for mild pain, fever or headache.    ? albuterol (VENTOLIN HFA) 108 (90 Base) MCG/ACT inhaler Inhale 2 puffs into the lungs every 4 (four) hours as needed for wheezing or shortness of breath. 1 each 3  ? alum & mag hydroxide-simeth (MAALOX/MYLANTA) 200-200-20 MG/5ML suspension Take by mouth every 6 (six) hours as needed for indigestion or heartburn.    ? amLODipine (NORVASC) 5 MG  tablet Take 1 tablet (5 mg total) by mouth daily.    ? atorvastatin (LIPITOR) 20 MG tablet Take 1 tablet (20 mg total) by mouth daily. 90 tablet 1  ? bisacodyl (DULCOLAX) 10 MG suppository Place 1 suppository (10 mg total) rectally daily as needed for moderate constipation or severe constipation. 12 suppository 0  ? divalproex (DEPAKOTE) 250 MG DR tablet Take 250 mg by mouth 3 (three) times daily.    ? feeding supplement, GLUCERNA SHAKE, (GLUCERNA SHAKE) LIQD Take 237 mLs by mouth 3 (three) times daily between meals.  0  ? guaiFENesin (ROBITUSSIN) 100 MG/5ML liquid Take 5 mLs by mouth every 4 (four) hours as needed for cough or to loosen phlegm.    ? insulin aspart (NOVOLOG) 100 UNIT/ML injection Inject 0-9 Units into the skin 3 (three) times daily with meals. Do NOT hold if patient is NPO. ?Sensitive Scale. 10 mL 11  ? insulin detemir (LEVEMIR) 100 UNIT/ML injection Inject 100 Units into the skin daily.    ? ipratropium-albuterol (DUONEB) 0.5-2.5 (3) MG/3ML SOLN Take 3 mLs by nebulization every 6 (six) hours as needed. 360 mL   ? metFORMIN (GLUCOPHAGE) 500 MG tablet Take by mouth 2 (two) times daily with a meal.    ? metoprolol tartrate (LOPRESSOR) 50 MG tablet Take 1  tablet (50 mg total) by mouth 2 (two) times daily.    ? Multiple Vitamin (MULTIVITAMIN) LIQD Take 15 mLs by mouth daily.    ? Nutritional Supplements (,FEEDING SUPPLEMENT, PROSOURCE PLUS) liquid Take 30 mLs by mouth 3 (three) times daily between meals.    ? OLANZapine zydis (ZYPREXA) 5 MG disintegrating tablet Take 1 tablet (5 mg total) by mouth 2 (two) times daily.    ? ondansetron (ZOFRAN) 4 MG tablet Take 4 mg by mouth every 8 (eight) hours as needed for nausea or vomiting.    ? polyethylene glycol (MIRALAX / GLYCOLAX) 17 g packet Take 17 g by mouth daily. 14 each 0  ? senna (SENOKOT) 8.6 MG TABS tablet Take 1 tablet (8.6 mg total) by mouth 2 (two) times daily. 120 tablet 0  ? senna-docusate (SENOKOT-S) 8.6-50 MG tablet Take 1 tablet by mouth  daily.    ? docusate sodium (COLACE) 100 MG capsule Take 1 capsule (100 mg total) by mouth 2 (two) times daily. 10 capsule 0  ? insulin aspart (NOVOLOG) 100 UNIT/ML injection Inject 4 Units into the skin 3 (

## 2021-12-23 NOTE — Patient Instructions (Signed)
Continue with physical therapy, at least 3 times per week  ?Continue with your current medications  ?Follow up with Dr. Wynetta Emery Neurosurgery  ?Follow up in 6 months  ?

## 2021-12-24 DIAGNOSIS — E785 Hyperlipidemia, unspecified: Secondary | ICD-10-CM | POA: Diagnosis not present

## 2021-12-24 DIAGNOSIS — I10 Essential (primary) hypertension: Secondary | ICD-10-CM | POA: Diagnosis not present

## 2021-12-24 DIAGNOSIS — G912 (Idiopathic) normal pressure hydrocephalus: Secondary | ICD-10-CM | POA: Diagnosis not present

## 2021-12-24 DIAGNOSIS — E1165 Type 2 diabetes mellitus with hyperglycemia: Secondary | ICD-10-CM | POA: Diagnosis not present

## 2022-01-12 DIAGNOSIS — E1169 Type 2 diabetes mellitus with other specified complication: Secondary | ICD-10-CM | POA: Diagnosis not present

## 2022-01-15 DIAGNOSIS — G912 (Idiopathic) normal pressure hydrocephalus: Secondary | ICD-10-CM | POA: Diagnosis not present

## 2022-01-16 DIAGNOSIS — Z79899 Other long term (current) drug therapy: Secondary | ICD-10-CM | POA: Diagnosis not present

## 2022-01-16 DIAGNOSIS — E1165 Type 2 diabetes mellitus with hyperglycemia: Secondary | ICD-10-CM | POA: Diagnosis not present

## 2022-01-21 ENCOUNTER — Encounter (HOSPITAL_COMMUNITY): Payer: Self-pay | Admitting: Oral Surgery

## 2022-01-21 DIAGNOSIS — R4182 Altered mental status, unspecified: Secondary | ICD-10-CM | POA: Diagnosis not present

## 2022-01-21 DIAGNOSIS — G300 Alzheimer's disease with early onset: Secondary | ICD-10-CM | POA: Diagnosis not present

## 2022-01-21 DIAGNOSIS — R062 Wheezing: Secondary | ICD-10-CM | POA: Diagnosis not present

## 2022-01-21 DIAGNOSIS — R400 Somnolence: Secondary | ICD-10-CM | POA: Diagnosis not present

## 2022-01-21 DIAGNOSIS — R531 Weakness: Secondary | ICD-10-CM | POA: Diagnosis not present

## 2022-01-21 DIAGNOSIS — F3176 Bipolar disorder, in full remission, most recent episode depressed: Secondary | ICD-10-CM | POA: Diagnosis not present

## 2022-01-21 DIAGNOSIS — F29 Unspecified psychosis not due to a substance or known physiological condition: Secondary | ICD-10-CM | POA: Diagnosis not present

## 2022-01-21 DIAGNOSIS — E785 Hyperlipidemia, unspecified: Secondary | ICD-10-CM | POA: Diagnosis not present

## 2022-01-21 DIAGNOSIS — M6281 Muscle weakness (generalized): Secondary | ICD-10-CM | POA: Diagnosis not present

## 2022-01-21 DIAGNOSIS — B348 Other viral infections of unspecified site: Secondary | ICD-10-CM | POA: Diagnosis not present

## 2022-01-21 DIAGNOSIS — M25531 Pain in right wrist: Secondary | ICD-10-CM | POA: Diagnosis not present

## 2022-01-21 DIAGNOSIS — H8110 Benign paroxysmal vertigo, unspecified ear: Secondary | ICD-10-CM | POA: Diagnosis not present

## 2022-01-21 DIAGNOSIS — N182 Chronic kidney disease, stage 2 (mild): Secondary | ICD-10-CM | POA: Diagnosis not present

## 2022-01-21 DIAGNOSIS — G6281 Critical illness polyneuropathy: Secondary | ICD-10-CM | POA: Diagnosis not present

## 2022-01-21 DIAGNOSIS — I10 Essential (primary) hypertension: Secondary | ICD-10-CM | POA: Diagnosis not present

## 2022-01-21 DIAGNOSIS — R600 Localized edema: Secondary | ICD-10-CM | POA: Diagnosis not present

## 2022-01-21 DIAGNOSIS — R0981 Nasal congestion: Secondary | ICD-10-CM | POA: Diagnosis not present

## 2022-01-21 DIAGNOSIS — E1165 Type 2 diabetes mellitus with hyperglycemia: Secondary | ICD-10-CM | POA: Diagnosis not present

## 2022-01-21 DIAGNOSIS — F3132 Bipolar disorder, current episode depressed, moderate: Secondary | ICD-10-CM | POA: Diagnosis not present

## 2022-01-21 DIAGNOSIS — I129 Hypertensive chronic kidney disease with stage 1 through stage 4 chronic kidney disease, or unspecified chronic kidney disease: Secondary | ICD-10-CM | POA: Diagnosis not present

## 2022-01-21 DIAGNOSIS — R0989 Other specified symptoms and signs involving the circulatory and respiratory systems: Secondary | ICD-10-CM | POA: Diagnosis not present

## 2022-01-21 DIAGNOSIS — M7989 Other specified soft tissue disorders: Secondary | ICD-10-CM | POA: Diagnosis not present

## 2022-01-21 DIAGNOSIS — G912 (Idiopathic) normal pressure hydrocephalus: Secondary | ICD-10-CM | POA: Diagnosis not present

## 2022-01-21 DIAGNOSIS — G6289 Other specified polyneuropathies: Secondary | ICD-10-CM | POA: Diagnosis not present

## 2022-01-21 DIAGNOSIS — J45909 Unspecified asthma, uncomplicated: Secondary | ICD-10-CM | POA: Diagnosis not present

## 2022-01-21 NOTE — Progress Notes (Signed)
Surgical Instructions for Carolyn Norton ? ? ? Your procedure is scheduled on 01/23/22. ? Report to [facility] Main Entrance "A" at 5:30 A.M., then check in with the Admitting office. ? Call this number if you have problems the morning of surgery: ? [phone] ? ? If you have any questions prior to your surgery date call [phone]: Open Monday-Friday 8am-4pm ? ? ? Remember: ? Do not eat or drink after midnight Thursday (01/22/22). ?  ? Take these medicines the morning of surgery with A SIP OF WATER:  ?acetaminophen (TYLENOL) PRN ?albuterol (VENTOLIN HFA) PRN ?amLODipine (NORVASC) ?atorvastatin (LIPITOR) ?divalproex (DEPAKOTE)  ? ? ? ?Do not take Metformin on the morning of surgery. ?     ?THE MORNING OF SURGERY, take 50 Units Levemir Insulin.   ? ?If your blood sugar is less than 70 mg/dL, you will need to treat for low blood sugar: ?Treat a low blood sugar (less than 70 mg/dL) with ? cup of clear juice (cranberry or apple), 4 glucose tablets, OR glucose gel. ?Recheck blood sugar in 15 minutes after treatment (to make sure it is greater than 70 mg/dL). If your blood sugar is not greater than 70 mg/dL on recheck, call [phone] for further instructions. ? ? ?As of today, STOP taking any Aspirin (unless otherwise instructed by your surgeon) Aleve, Naproxen, Ibuprofen, Motrin, Advil, Goody's, BC's, all herbal medications, fish oil, and all vitamins. ? ?         ?Do not wear jewelry or makeup ?Do not wear lotions, powders, perfumes or deodorant. ?Do not shave 48 hours prior to surgery.   ?Do not bring valuables to the hospital. ?Do not wear nail polish, gel polish, artificial nails, or any other type of covering on natural nails (fingers and toes) ?If you have artificial nails or gel coating that need to be removed by a nail salon, please have this removed prior to surgery. Artificial nails or gel coating may interfere with anesthesia's ability to adequately monitor your vital signs. ? ?[facility] is not  responsible for any belongings or valuables. .  ? ?Do NOT Smoke (Tobacco/Vaping)  24 hours prior to your procedure ? ?If you use a CPAP at night, you may bring your mask for your overnight stay. ?  ?Contacts, glasses, hearing aids, dentures or partials may not be worn into surgery, please bring cases for these belongings ?  ?  ?Patients discharged the day of surgery will not be allowed to drive home, and someone needs to stay with them for 24 hours. ? ? ?SURGICAL WAITING ROOM VISITATION ?Patients having surgery or a procedure in a hospital may have two support people. ?Children under the age of 63 must have an adult with them who is not the patient. ?They may stay in the waiting area during the procedure and may switch out with other visitors. If the patient needs to stay at the hospital during part of their recovery, the visitor guidelines for inpatient rooms apply. ? ?Please refer to the [facility] website for the visitor guidelines for Inpatients (after your surgery is over and you are in a regular room).  ? ? ?Special instructions:   ? ?Oral Hygiene is also important to reduce your risk of infection.  Remember - BRUSH YOUR TEETH THE MORNING OF SURGERY WITH YOUR REGULAR TOOTHPASTE ? ?

## 2022-01-21 NOTE — Progress Notes (Signed)
TWO VISITORS ARE ALLOWED TO COME WITH YOU AND STAY IN THE SURGICAL WAITING ROOM ONLY DURING PRE OP AND PROCEDURE DAY OF SURGERY.  ? ?PCP - Dr Ninfa Meeker ?Cardiologist - n/a ?Neurology - Dr Alric Ran ? ?Chest x-ray - 04/21/21 (1V) ?EKG - 04/21/21 ?Stress Test - n/a ?ECHO - 04/08/21 ?Cardiac Cath - n/a ? ?ICD Pacemaker/Loop - n/a ? ?Sleep Study -  n/a ?CPAP - none ? ?Do not take metformin on the morning of surgery. ?     ?THE MORNING OF SURGERY, take 50 units Levemir Insulin.  ? ?If your blood sugar is less than 70 mg/dL, you will need to treat for low blood sugar: ?Treat a low blood sugar (less than 70 mg/dL) with ? cup of clear juice (cranberry or apple), 4 glucose tablets, OR glucose gel. ?Recheck blood sugar in 15 minutes after treatment (to make sure it is greater than 70 mg/dL). If your blood sugar is not greater than 70 mg/dL on recheck, call [phone] for further instructions. ? ?Anesthesia review: Yes ? ?STOP now taking any Aspirin (unless otherwise instructed by your surgeon), Aleve, Naproxen, Ibuprofen, Motrin, Advil, Goody's, BC's, all herbal medications, fish oil, and all vitamins.  ? ?Coronavirus Screening ?Does the patient have any of the following symptoms:  ?Cough yes/no: No ?Fever (>100.21F)  yes/no: No ?Runny nose yes/no: No ?Sore throat yes/no: No ?Difficulty breathing/shortness of breath  yes/no: No ? ?Have you traveled in the last 14 days and where? yes/no: No ? ?Carolyn Norton at [facility] [phone] verbalized understanding of instructions that were given via phone and faxed to [phone] ?

## 2022-01-21 NOTE — H&P (Addendum)
? ?  Patient: Carolyn Norton  PID: [id number]  DOB: 1966-03-03  SEX:  Female  ? ?Patient referred by DDS for extraction all remaining teeth # 2, 6-14, 20, 23-28. ? ?CC: Bad teeth. ? ?Past Medical History:  High Blood Pressure, Asthma, Hypercholesterolemia, Snoring, Diabetes, Delay in healing, Drug Abuse/Alcohol Abuse, Mental Health problems, CKD, Alzheimers Disease, Vertigo, Hydrocephaly, Myopathy, COPD, GERD, Adult failure to thrive,    ? ?Medications: Admelog, Acetaminophen, Amlodipine, Atorvastatin, Bisacodyl, Depakote, Dulera, GlycoLax Powder, Ipratropium albuterol, Metformin HCL, Metoprolol, Semglee, multi-vitamin, Glargine/Human, senna plus, Ventolin, MCG Albuterol, Zyprexa, Olanzapine   ? ?Allergies:     Erythromycin   ? ?Surgeries:   Drainage hydrocephaly    ? ?Social History       ?Smoking:  1 ppd          ?Alcohol: n ?Drug use:   in past         ?                 ?Exam: BMI 26. Decay remaining teeth #  2, 6-14, 20, 23-28. No purulence, edema, fluctuance, trismus. Oral cancer screening negative. Pharynx clear. No lymphadenopathy. ? ?Panorex:Decay remaining teeth #  2, 6-14, 20, 23-28.  ? ?Assessment: ASA 3. Non-restorable teeth #  2, 6-14, 20, 23-28             ? ?Plan: 1. MD Clearance obtained ? 2. Extraction Teeth # 2, 6-14, 20, 23-28. Alveoloplasty.  Hospital Day surgery.                ? ?Rx: n              ? ?Risks and complications explained. Questions answered.  ? ?Gae Bon, DMD ? ?

## 2022-01-22 ENCOUNTER — Encounter (HOSPITAL_COMMUNITY): Payer: Self-pay | Admitting: Physician Assistant

## 2022-01-22 NOTE — Anesthesia Preprocedure Evaluation (Deleted)
Anesthesia Evaluation    Reviewed: Allergy & Precautions, Patient's Chart, lab work & pertinent test results, reviewed documented beta blocker date and time   Airway        Dental   Pulmonary asthma , former smoker,           Cardiovascular hypertension, Pt. on medications and Pt. on home beta blockers   TTE 2022 1. Left ventricular ejection fraction, by estimation, is 60 to 65%. The  left ventricle has normal function. The left ventricle has no regional  wall motion abnormalities. There is mild concentric left ventricular  hypertrophy. Left ventricular diastolic  parameters are consistent with Grade I diastolic dysfunction (impaired  relaxation). Elevated left atrial pressure.  2. Right ventricular systolic function is normal. The right ventricular  size is normal. Tricuspid regurgitation signal is inadequate for assessing  PA pressure.  3. The mitral valve is normal in structure. No evidence of mitral valve  regurgitation. No evidence of mitral stenosis.  4. The aortic valve is normal in structure. Aortic valve regurgitation is  not visualized. No aortic stenosis is present.  5. The inferior vena cava is normal in size with greater than 50%  respiratory variability, suggesting right atrial pressure of 3 mmHg.    Neuro/Psych PSYCHIATRIC DISORDERS Anxiety Depression Bipolar Disorder negative neurological ROS     GI/Hepatic negative GI ROS, (+)     substance abuse  cocaine use,   Endo/Other  diabetes, Insulin Dependent, Oral Hypoglycemic Agents  Renal/GU negative Renal ROS  negative genitourinary   Musculoskeletal negative musculoskeletal ROS (+)   Abdominal   Peds  Hematology negative hematology ROS (+)   Anesthesia Other Findings   Reproductive/Obstetrics                             Anesthesia Physical Anesthesia Plan  ASA: 3  Anesthesia Plan: General   Post-op Pain  Management: Tylenol PO (pre-op)* and Toradol IV (intra-op)*   Induction: Intravenous  PONV Risk Score and Plan: 3 and Midazolam, Dexamethasone and Ondansetron  Airway Management Planned: Nasal ETT  Additional Equipment:   Intra-op Plan:   Post-operative Plan: Extubation in OR  Informed Consent: I have reviewed the patients History and Physical, chart, labs and discussed the procedure including the risks, benefits and alternatives for the proposed anesthesia with the patient or authorized representative who has indicated his/her understanding and acceptance.     Dental advisory given  Plan Discussed with: CRNA  Anesthesia Plan Comments: (Case cancelled by proceduralist 2/2 NPO status and patient refusal for surgery)       Anesthesia Quick Evaluation

## 2022-01-23 ENCOUNTER — Ambulatory Visit (HOSPITAL_COMMUNITY)
Admission: RE | Admit: 2022-01-23 | Discharge: 2022-01-23 | Disposition: A | Discharge status: Skilled Nursing Facility | Payer: Medicaid Other | Attending: Oral Surgery | Admitting: Oral Surgery

## 2022-01-23 ENCOUNTER — Encounter (HOSPITAL_COMMUNITY): Payer: Self-pay | Admitting: Oral Surgery

## 2022-01-23 ENCOUNTER — Encounter (HOSPITAL_COMMUNITY)
Admission: RE | Disposition: A | Discharge status: Skilled Nursing Facility | Payer: Self-pay | Source: Home / Self Care | Attending: Oral Surgery

## 2022-01-23 DIAGNOSIS — Z539 Procedure and treatment not carried out, unspecified reason: Secondary | ICD-10-CM | POA: Insufficient documentation

## 2022-01-23 HISTORY — DX: Chronic kidney disease, unspecified: N18.9

## 2022-01-23 HISTORY — DX: Dementia in other diseases classified elsewhere, unspecified severity, without behavioral disturbance, psychotic disturbance, mood disturbance, and anxiety: F02.80

## 2022-01-23 HISTORY — DX: Bipolar disorder, unspecified: F31.9

## 2022-01-23 LAB — GLUCOSE, CAPILLARY: Glucose-Capillary: 229 mg/dL — ABNORMAL HIGH (ref 70–99)

## 2022-01-23 SURGERY — DENTAL RESTORATION/EXTRACTIONS
Anesthesia: General

## 2022-01-23 MED ORDER — LACTATED RINGERS IV SOLN: INTRAVENOUS | Status: DC

## 2022-01-23 MED ORDER — LIDOCAINE-EPINEPHRINE 2 %-1:100000 IJ SOLN
INTRAMUSCULAR | Status: AC
  Filled 2022-01-23: qty 8.5

## 2022-01-23 MED ORDER — OXYMETAZOLINE HCL 0.05 % NA SOLN
NASAL | Status: AC
  Filled 2022-01-23: qty 90

## 2022-01-23 MED ORDER — PROPOFOL 10 MG/ML IV BOLUS
INTRAVENOUS | Status: AC
  Filled 2022-01-23: qty 20

## 2022-01-23 MED ORDER — MIDAZOLAM HCL 2 MG/2ML IJ SOLN
INTRAMUSCULAR | Status: AC
  Filled 2022-01-23: qty 2

## 2022-01-23 MED ORDER — CHLORHEXIDINE GLUCONATE 0.12 % MT SOLN: 15.0000 mL | Freq: Once | OROMUCOSAL | Status: DC

## 2022-01-23 MED ORDER — ORAL CARE MOUTH RINSE: 15.0000 mL | Freq: Once | OROMUCOSAL | Status: DC

## 2022-01-23 MED ORDER — FENTANYL CITRATE (PF) 250 MCG/5ML IJ SOLN
INTRAMUSCULAR | Status: AC
  Filled 2022-01-23: qty 5

## 2022-01-23 MED ORDER — LIDOCAINE-EPINEPHRINE 2 %-1:100000 IJ SOLN
INTRAMUSCULAR | Status: AC
  Filled 2022-01-23: qty 3

## 2022-01-23 NOTE — Progress Notes (Addendum)
Upon arrival to short stay, patients states that she drank 16-20oz of mountain dew and "food" around 5:10 because her blood sugar was low. Patient is oriented to self and states that she is here to have her teeth removed. Patient stated that she lives at [facility], does not know what hospital she is at, and the year is 2013. Pt states she does not remember what she ate, but stated that the nurses at the facility did not know that she ate. Nurse at facility contacted and stated that patient has not eaten since dinner, but has snacks in her room. Nurse states that patient cannot get out of bed, and would not have been able to get to snacks in her room.   Patient refusing to get dressed for surgery, stating that she does not want to have surgery.   Dr. Lanetta Inch and Dr. Hoyt Koch notified. Dr. Hoyt Koch to talk to patient regarding surgery.   Per Dr. Hoyt Koch, patient's surgery cancelled for today. [name] notified, and facility called notified.

## 2022-01-28 DIAGNOSIS — R0981 Nasal congestion: Secondary | ICD-10-CM | POA: Diagnosis not present

## 2022-01-28 DIAGNOSIS — J45909 Unspecified asthma, uncomplicated: Secondary | ICD-10-CM | POA: Diagnosis not present

## 2022-01-28 DIAGNOSIS — G6289 Other specified polyneuropathies: Secondary | ICD-10-CM | POA: Diagnosis not present

## 2022-01-28 DIAGNOSIS — F3176 Bipolar disorder, in full remission, most recent episode depressed: Secondary | ICD-10-CM | POA: Diagnosis not present

## 2022-01-28 DIAGNOSIS — B348 Other viral infections of unspecified site: Secondary | ICD-10-CM | POA: Diagnosis not present

## 2022-01-28 DIAGNOSIS — M6281 Muscle weakness (generalized): Secondary | ICD-10-CM | POA: Diagnosis not present

## 2022-01-28 DIAGNOSIS — R600 Localized edema: Secondary | ICD-10-CM | POA: Diagnosis not present

## 2022-01-28 DIAGNOSIS — G300 Alzheimer's disease with early onset: Secondary | ICD-10-CM | POA: Diagnosis not present

## 2022-01-28 DIAGNOSIS — G912 (Idiopathic) normal pressure hydrocephalus: Secondary | ICD-10-CM | POA: Diagnosis not present

## 2022-01-28 DIAGNOSIS — M7989 Other specified soft tissue disorders: Secondary | ICD-10-CM | POA: Diagnosis not present

## 2022-01-28 DIAGNOSIS — E785 Hyperlipidemia, unspecified: Secondary | ICD-10-CM | POA: Diagnosis not present

## 2022-01-28 DIAGNOSIS — R0989 Other specified symptoms and signs involving the circulatory and respiratory systems: Secondary | ICD-10-CM | POA: Diagnosis not present

## 2022-01-28 DIAGNOSIS — I129 Hypertensive chronic kidney disease with stage 1 through stage 4 chronic kidney disease, or unspecified chronic kidney disease: Secondary | ICD-10-CM | POA: Diagnosis not present

## 2022-01-28 DIAGNOSIS — H8110 Benign paroxysmal vertigo, unspecified ear: Secondary | ICD-10-CM | POA: Diagnosis not present

## 2022-01-28 DIAGNOSIS — F29 Unspecified psychosis not due to a substance or known physiological condition: Secondary | ICD-10-CM | POA: Diagnosis not present

## 2022-01-28 DIAGNOSIS — F3132 Bipolar disorder, current episode depressed, moderate: Secondary | ICD-10-CM | POA: Diagnosis not present

## 2022-01-28 DIAGNOSIS — I10 Essential (primary) hypertension: Secondary | ICD-10-CM | POA: Diagnosis not present

## 2022-01-28 DIAGNOSIS — R062 Wheezing: Secondary | ICD-10-CM | POA: Diagnosis not present

## 2022-01-28 DIAGNOSIS — M25531 Pain in right wrist: Secondary | ICD-10-CM | POA: Diagnosis not present

## 2022-01-28 DIAGNOSIS — R400 Somnolence: Secondary | ICD-10-CM | POA: Diagnosis not present

## 2022-01-28 DIAGNOSIS — N182 Chronic kidney disease, stage 2 (mild): Secondary | ICD-10-CM | POA: Diagnosis not present

## 2022-01-28 DIAGNOSIS — R4182 Altered mental status, unspecified: Secondary | ICD-10-CM | POA: Diagnosis not present

## 2022-01-28 DIAGNOSIS — E1165 Type 2 diabetes mellitus with hyperglycemia: Secondary | ICD-10-CM | POA: Diagnosis not present

## 2022-01-28 DIAGNOSIS — G6281 Critical illness polyneuropathy: Secondary | ICD-10-CM | POA: Diagnosis not present

## 2022-01-28 DIAGNOSIS — R531 Weakness: Secondary | ICD-10-CM | POA: Diagnosis not present

## 2022-02-02 DIAGNOSIS — E1165 Type 2 diabetes mellitus with hyperglycemia: Secondary | ICD-10-CM | POA: Diagnosis not present

## 2022-02-02 DIAGNOSIS — Z794 Long term (current) use of insulin: Secondary | ICD-10-CM | POA: Diagnosis not present

## 2022-02-02 DIAGNOSIS — E785 Hyperlipidemia, unspecified: Secondary | ICD-10-CM | POA: Diagnosis not present

## 2022-02-02 DIAGNOSIS — J449 Chronic obstructive pulmonary disease, unspecified: Secondary | ICD-10-CM | POA: Diagnosis not present

## 2022-02-05 DIAGNOSIS — N182 Chronic kidney disease, stage 2 (mild): Secondary | ICD-10-CM | POA: Diagnosis not present

## 2022-02-05 DIAGNOSIS — M25531 Pain in right wrist: Secondary | ICD-10-CM | POA: Diagnosis not present

## 2022-02-05 DIAGNOSIS — G6289 Other specified polyneuropathies: Secondary | ICD-10-CM | POA: Diagnosis not present

## 2022-02-05 DIAGNOSIS — R0981 Nasal congestion: Secondary | ICD-10-CM | POA: Diagnosis not present

## 2022-02-05 DIAGNOSIS — R531 Weakness: Secondary | ICD-10-CM | POA: Diagnosis not present

## 2022-02-05 DIAGNOSIS — F3132 Bipolar disorder, current episode depressed, moderate: Secondary | ICD-10-CM | POA: Diagnosis not present

## 2022-02-05 DIAGNOSIS — I129 Hypertensive chronic kidney disease with stage 1 through stage 4 chronic kidney disease, or unspecified chronic kidney disease: Secondary | ICD-10-CM | POA: Diagnosis not present

## 2022-02-05 DIAGNOSIS — R600 Localized edema: Secondary | ICD-10-CM | POA: Diagnosis not present

## 2022-02-05 DIAGNOSIS — E1165 Type 2 diabetes mellitus with hyperglycemia: Secondary | ICD-10-CM | POA: Diagnosis not present

## 2022-02-05 DIAGNOSIS — F3176 Bipolar disorder, in full remission, most recent episode depressed: Secondary | ICD-10-CM | POA: Diagnosis not present

## 2022-02-05 DIAGNOSIS — J45909 Unspecified asthma, uncomplicated: Secondary | ICD-10-CM | POA: Diagnosis not present

## 2022-02-05 DIAGNOSIS — G912 (Idiopathic) normal pressure hydrocephalus: Secondary | ICD-10-CM | POA: Diagnosis not present

## 2022-02-05 DIAGNOSIS — I10 Essential (primary) hypertension: Secondary | ICD-10-CM | POA: Diagnosis not present

## 2022-02-05 DIAGNOSIS — F29 Unspecified psychosis not due to a substance or known physiological condition: Secondary | ICD-10-CM | POA: Diagnosis not present

## 2022-02-05 DIAGNOSIS — E785 Hyperlipidemia, unspecified: Secondary | ICD-10-CM | POA: Diagnosis not present

## 2022-02-05 DIAGNOSIS — G300 Alzheimer's disease with early onset: Secondary | ICD-10-CM | POA: Diagnosis not present

## 2022-02-05 DIAGNOSIS — R400 Somnolence: Secondary | ICD-10-CM | POA: Diagnosis not present

## 2022-02-05 DIAGNOSIS — R4182 Altered mental status, unspecified: Secondary | ICD-10-CM | POA: Diagnosis not present

## 2022-02-05 DIAGNOSIS — R062 Wheezing: Secondary | ICD-10-CM | POA: Diagnosis not present

## 2022-02-05 DIAGNOSIS — G6281 Critical illness polyneuropathy: Secondary | ICD-10-CM | POA: Diagnosis not present

## 2022-02-05 DIAGNOSIS — B348 Other viral infections of unspecified site: Secondary | ICD-10-CM | POA: Diagnosis not present

## 2022-02-05 DIAGNOSIS — H8110 Benign paroxysmal vertigo, unspecified ear: Secondary | ICD-10-CM | POA: Diagnosis not present

## 2022-02-05 DIAGNOSIS — M7989 Other specified soft tissue disorders: Secondary | ICD-10-CM | POA: Diagnosis not present

## 2022-02-05 DIAGNOSIS — R0989 Other specified symptoms and signs involving the circulatory and respiratory systems: Secondary | ICD-10-CM | POA: Diagnosis not present

## 2022-02-05 DIAGNOSIS — M6281 Muscle weakness (generalized): Secondary | ICD-10-CM | POA: Diagnosis not present

## 2022-02-11 DIAGNOSIS — H8110 Benign paroxysmal vertigo, unspecified ear: Secondary | ICD-10-CM | POA: Diagnosis not present

## 2022-02-11 DIAGNOSIS — I10 Essential (primary) hypertension: Secondary | ICD-10-CM | POA: Diagnosis not present

## 2022-02-11 DIAGNOSIS — R400 Somnolence: Secondary | ICD-10-CM | POA: Diagnosis not present

## 2022-02-11 DIAGNOSIS — R0989 Other specified symptoms and signs involving the circulatory and respiratory systems: Secondary | ICD-10-CM | POA: Diagnosis not present

## 2022-02-11 DIAGNOSIS — E785 Hyperlipidemia, unspecified: Secondary | ICD-10-CM | POA: Diagnosis not present

## 2022-02-11 DIAGNOSIS — R0981 Nasal congestion: Secondary | ICD-10-CM | POA: Diagnosis not present

## 2022-02-11 DIAGNOSIS — J45909 Unspecified asthma, uncomplicated: Secondary | ICD-10-CM | POA: Diagnosis not present

## 2022-02-11 DIAGNOSIS — M25531 Pain in right wrist: Secondary | ICD-10-CM | POA: Diagnosis not present

## 2022-02-11 DIAGNOSIS — F29 Unspecified psychosis not due to a substance or known physiological condition: Secondary | ICD-10-CM | POA: Diagnosis not present

## 2022-02-11 DIAGNOSIS — I129 Hypertensive chronic kidney disease with stage 1 through stage 4 chronic kidney disease, or unspecified chronic kidney disease: Secondary | ICD-10-CM | POA: Diagnosis not present

## 2022-02-11 DIAGNOSIS — G912 (Idiopathic) normal pressure hydrocephalus: Secondary | ICD-10-CM | POA: Diagnosis not present

## 2022-02-11 DIAGNOSIS — N182 Chronic kidney disease, stage 2 (mild): Secondary | ICD-10-CM | POA: Diagnosis not present

## 2022-02-11 DIAGNOSIS — R4182 Altered mental status, unspecified: Secondary | ICD-10-CM | POA: Diagnosis not present

## 2022-02-11 DIAGNOSIS — G6289 Other specified polyneuropathies: Secondary | ICD-10-CM | POA: Diagnosis not present

## 2022-02-11 DIAGNOSIS — R062 Wheezing: Secondary | ICD-10-CM | POA: Diagnosis not present

## 2022-02-11 DIAGNOSIS — M6281 Muscle weakness (generalized): Secondary | ICD-10-CM | POA: Diagnosis not present

## 2022-02-11 DIAGNOSIS — R531 Weakness: Secondary | ICD-10-CM | POA: Diagnosis not present

## 2022-02-11 DIAGNOSIS — B348 Other viral infections of unspecified site: Secondary | ICD-10-CM | POA: Diagnosis not present

## 2022-02-11 DIAGNOSIS — R600 Localized edema: Secondary | ICD-10-CM | POA: Diagnosis not present

## 2022-02-11 DIAGNOSIS — G6281 Critical illness polyneuropathy: Secondary | ICD-10-CM | POA: Diagnosis not present

## 2022-02-11 DIAGNOSIS — G300 Alzheimer's disease with early onset: Secondary | ICD-10-CM | POA: Diagnosis not present

## 2022-02-11 DIAGNOSIS — F3132 Bipolar disorder, current episode depressed, moderate: Secondary | ICD-10-CM | POA: Diagnosis not present

## 2022-02-11 DIAGNOSIS — F3176 Bipolar disorder, in full remission, most recent episode depressed: Secondary | ICD-10-CM | POA: Diagnosis not present

## 2022-02-11 DIAGNOSIS — M7989 Other specified soft tissue disorders: Secondary | ICD-10-CM | POA: Diagnosis not present

## 2022-02-11 DIAGNOSIS — E1165 Type 2 diabetes mellitus with hyperglycemia: Secondary | ICD-10-CM | POA: Diagnosis not present

## 2022-02-12 DIAGNOSIS — E1165 Type 2 diabetes mellitus with hyperglycemia: Secondary | ICD-10-CM | POA: Diagnosis not present

## 2022-02-12 DIAGNOSIS — Z79899 Other long term (current) drug therapy: Secondary | ICD-10-CM | POA: Diagnosis not present

## 2022-02-12 DIAGNOSIS — Z794 Long term (current) use of insulin: Secondary | ICD-10-CM | POA: Diagnosis not present

## 2022-02-18 DIAGNOSIS — I10 Essential (primary) hypertension: Secondary | ICD-10-CM | POA: Diagnosis not present

## 2022-02-18 DIAGNOSIS — G300 Alzheimer's disease with early onset: Secondary | ICD-10-CM | POA: Diagnosis not present

## 2022-02-18 DIAGNOSIS — H8110 Benign paroxysmal vertigo, unspecified ear: Secondary | ICD-10-CM | POA: Diagnosis not present

## 2022-02-18 DIAGNOSIS — F3132 Bipolar disorder, current episode depressed, moderate: Secondary | ICD-10-CM | POA: Diagnosis not present

## 2022-02-18 DIAGNOSIS — I129 Hypertensive chronic kidney disease with stage 1 through stage 4 chronic kidney disease, or unspecified chronic kidney disease: Secondary | ICD-10-CM | POA: Diagnosis not present

## 2022-02-18 DIAGNOSIS — E1165 Type 2 diabetes mellitus with hyperglycemia: Secondary | ICD-10-CM | POA: Diagnosis not present

## 2022-02-18 DIAGNOSIS — G6281 Critical illness polyneuropathy: Secondary | ICD-10-CM | POA: Diagnosis not present

## 2022-02-18 DIAGNOSIS — J45909 Unspecified asthma, uncomplicated: Secondary | ICD-10-CM | POA: Diagnosis not present

## 2022-02-18 DIAGNOSIS — B348 Other viral infections of unspecified site: Secondary | ICD-10-CM | POA: Diagnosis not present

## 2022-02-18 DIAGNOSIS — M7989 Other specified soft tissue disorders: Secondary | ICD-10-CM | POA: Diagnosis not present

## 2022-02-18 DIAGNOSIS — R400 Somnolence: Secondary | ICD-10-CM | POA: Diagnosis not present

## 2022-02-18 DIAGNOSIS — G6289 Other specified polyneuropathies: Secondary | ICD-10-CM | POA: Diagnosis not present

## 2022-02-18 DIAGNOSIS — F3176 Bipolar disorder, in full remission, most recent episode depressed: Secondary | ICD-10-CM | POA: Diagnosis not present

## 2022-02-18 DIAGNOSIS — G912 (Idiopathic) normal pressure hydrocephalus: Secondary | ICD-10-CM | POA: Diagnosis not present

## 2022-02-18 DIAGNOSIS — M25531 Pain in right wrist: Secondary | ICD-10-CM | POA: Diagnosis not present

## 2022-02-18 DIAGNOSIS — R531 Weakness: Secondary | ICD-10-CM | POA: Diagnosis not present

## 2022-02-18 DIAGNOSIS — R600 Localized edema: Secondary | ICD-10-CM | POA: Diagnosis not present

## 2022-02-18 DIAGNOSIS — M6281 Muscle weakness (generalized): Secondary | ICD-10-CM | POA: Diagnosis not present

## 2022-02-18 DIAGNOSIS — R062 Wheezing: Secondary | ICD-10-CM | POA: Diagnosis not present

## 2022-02-18 DIAGNOSIS — R0989 Other specified symptoms and signs involving the circulatory and respiratory systems: Secondary | ICD-10-CM | POA: Diagnosis not present

## 2022-02-18 DIAGNOSIS — F29 Unspecified psychosis not due to a substance or known physiological condition: Secondary | ICD-10-CM | POA: Diagnosis not present

## 2022-02-18 DIAGNOSIS — E785 Hyperlipidemia, unspecified: Secondary | ICD-10-CM | POA: Diagnosis not present

## 2022-02-18 DIAGNOSIS — N182 Chronic kidney disease, stage 2 (mild): Secondary | ICD-10-CM | POA: Diagnosis not present

## 2022-02-18 DIAGNOSIS — R0981 Nasal congestion: Secondary | ICD-10-CM | POA: Diagnosis not present

## 2022-02-18 DIAGNOSIS — R4182 Altered mental status, unspecified: Secondary | ICD-10-CM | POA: Diagnosis not present

## 2022-02-24 ENCOUNTER — Other Ambulatory Visit: Payer: Self-pay

## 2022-02-24 ENCOUNTER — Emergency Department (HOSPITAL_COMMUNITY): Payer: Medicaid Other

## 2022-02-24 ENCOUNTER — Encounter (HOSPITAL_COMMUNITY): Payer: Self-pay | Admitting: *Deleted

## 2022-02-24 ENCOUNTER — Inpatient Hospital Stay (HOSPITAL_COMMUNITY)
Admission: EM | Admit: 2022-02-24 | Discharge: 2022-02-26 | DRG: 637 | Disposition: A | Discharge status: Skilled Nursing Facility | Payer: Medicaid Other | Source: Skilled Nursing Facility | Attending: Internal Medicine | Admitting: Internal Medicine

## 2022-02-24 DIAGNOSIS — G309 Alzheimer's disease, unspecified: Secondary | ICD-10-CM | POA: Diagnosis present

## 2022-02-24 DIAGNOSIS — G9341 Metabolic encephalopathy: Secondary | ICD-10-CM | POA: Diagnosis present

## 2022-02-24 DIAGNOSIS — N179 Acute kidney failure, unspecified: Secondary | ICD-10-CM | POA: Diagnosis present

## 2022-02-24 DIAGNOSIS — E785 Hyperlipidemia, unspecified: Secondary | ICD-10-CM | POA: Diagnosis present

## 2022-02-24 DIAGNOSIS — E1010 Type 1 diabetes mellitus with ketoacidosis without coma: Principal | ICD-10-CM

## 2022-02-24 DIAGNOSIS — E1111 Type 2 diabetes mellitus with ketoacidosis with coma: Secondary | ICD-10-CM | POA: Diagnosis not present

## 2022-02-24 DIAGNOSIS — E1110 Type 2 diabetes mellitus with ketoacidosis without coma: Principal | ICD-10-CM | POA: Diagnosis present

## 2022-02-24 DIAGNOSIS — R531 Weakness: Secondary | ICD-10-CM | POA: Diagnosis not present

## 2022-02-24 DIAGNOSIS — E860 Dehydration: Secondary | ICD-10-CM | POA: Diagnosis present

## 2022-02-24 DIAGNOSIS — Z7984 Long term (current) use of oral hypoglycemic drugs: Secondary | ICD-10-CM

## 2022-02-24 DIAGNOSIS — Z87891 Personal history of nicotine dependence: Secondary | ICD-10-CM

## 2022-02-24 DIAGNOSIS — J45909 Unspecified asthma, uncomplicated: Secondary | ICD-10-CM | POA: Diagnosis present

## 2022-02-24 DIAGNOSIS — F319 Bipolar disorder, unspecified: Secondary | ICD-10-CM | POA: Diagnosis present

## 2022-02-24 DIAGNOSIS — Z8249 Family history of ischemic heart disease and other diseases of the circulatory system: Secondary | ICD-10-CM

## 2022-02-24 DIAGNOSIS — E1169 Type 2 diabetes mellitus with other specified complication: Secondary | ICD-10-CM | POA: Diagnosis present

## 2022-02-24 DIAGNOSIS — Z794 Long term (current) use of insulin: Secondary | ICD-10-CM

## 2022-02-24 DIAGNOSIS — I129 Hypertensive chronic kidney disease with stage 1 through stage 4 chronic kidney disease, or unspecified chronic kidney disease: Secondary | ICD-10-CM | POA: Diagnosis present

## 2022-02-24 DIAGNOSIS — R4182 Altered mental status, unspecified: Secondary | ICD-10-CM | POA: Diagnosis not present

## 2022-02-24 DIAGNOSIS — I959 Hypotension, unspecified: Secondary | ICD-10-CM | POA: Diagnosis present

## 2022-02-24 DIAGNOSIS — E876 Hypokalemia: Secondary | ICD-10-CM | POA: Diagnosis present

## 2022-02-24 DIAGNOSIS — R4189 Other symptoms and signs involving cognitive functions and awareness: Secondary | ICD-10-CM | POA: Diagnosis present

## 2022-02-24 DIAGNOSIS — Z79899 Other long term (current) drug therapy: Secondary | ICD-10-CM

## 2022-02-24 DIAGNOSIS — N181 Chronic kidney disease, stage 1: Secondary | ICD-10-CM | POA: Diagnosis present

## 2022-02-24 DIAGNOSIS — E1122 Type 2 diabetes mellitus with diabetic chronic kidney disease: Secondary | ICD-10-CM | POA: Diagnosis present

## 2022-02-24 DIAGNOSIS — R000 Tachycardia, unspecified: Secondary | ICD-10-CM | POA: Diagnosis not present

## 2022-02-24 DIAGNOSIS — R61 Generalized hyperhidrosis: Secondary | ICD-10-CM | POA: Diagnosis not present

## 2022-02-24 DIAGNOSIS — F0280 Dementia in other diseases classified elsewhere without behavioral disturbance: Secondary | ICD-10-CM | POA: Diagnosis present

## 2022-02-24 DIAGNOSIS — D72829 Elevated white blood cell count, unspecified: Secondary | ICD-10-CM | POA: Diagnosis present

## 2022-02-24 DIAGNOSIS — I10 Essential (primary) hypertension: Secondary | ICD-10-CM | POA: Diagnosis present

## 2022-02-24 DIAGNOSIS — E119 Type 2 diabetes mellitus without complications: Secondary | ICD-10-CM

## 2022-02-24 DIAGNOSIS — Z881 Allergy status to other antibiotic agents status: Secondary | ICD-10-CM

## 2022-02-24 LAB — CBC WITH DIFFERENTIAL/PLATELET
Abs Immature Granulocytes: 0.4 10*3/uL — ABNORMAL HIGH (ref 0.00–0.07)
Basophils Absolute: 0.2 10*3/uL — ABNORMAL HIGH (ref 0.0–0.1)
Basophils Relative: 1 %
Eosinophils Absolute: 0.4 10*3/uL (ref 0.0–0.5)
Eosinophils Relative: 2 %
HCT: 40.4 % (ref 36.0–46.0)
Hemoglobin: 13.4 g/dL (ref 12.0–15.0)
Lymphocytes Relative: 9 %
Lymphs Abs: 1.8 10*3/uL (ref 0.7–4.0)
MCH: 28.8 pg (ref 26.0–34.0)
MCHC: 33.2 g/dL (ref 30.0–36.0)
MCV: 86.7 fL (ref 80.0–100.0)
Monocytes Absolute: 0.8 10*3/uL (ref 0.1–1.0)
Monocytes Relative: 4 %
Myelocytes: 2 %
Neutro Abs: 16.4 10*3/uL — ABNORMAL HIGH (ref 1.7–7.7)
Neutrophils Relative %: 82 %
Platelets: 280 10*3/uL (ref 150–400)
RBC: 4.66 MIL/uL (ref 3.87–5.11)
RDW: 14.1 % (ref 11.5–15.5)
WBC: 20 10*3/uL — ABNORMAL HIGH (ref 4.0–10.5)
nRBC: 0 % (ref 0.0–0.2)

## 2022-02-24 LAB — CBG MONITORING, ED
Glucose-Capillary: 546 mg/dL (ref 70–99)
Glucose-Capillary: 425 mg/dL — ABNORMAL HIGH (ref 70–99)
Glucose-Capillary: 458 mg/dL — ABNORMAL HIGH (ref 70–99)
Glucose-Capillary: 292 mg/dL — ABNORMAL HIGH (ref 70–99)
Glucose-Capillary: 405 mg/dL — ABNORMAL HIGH (ref 70–99)
Glucose-Capillary: 276 mg/dL — ABNORMAL HIGH (ref 70–99)

## 2022-02-24 LAB — CBC
HCT: 38 % (ref 36.0–46.0)
Hemoglobin: 12.4 g/dL (ref 12.0–15.0)
MCH: 28.2 pg (ref 26.0–34.0)
MCHC: 32.6 g/dL (ref 30.0–36.0)
MCV: 86.6 fL (ref 80.0–100.0)
Platelets: 267 10*3/uL (ref 150–400)
RBC: 4.39 MIL/uL (ref 3.87–5.11)
RDW: 14.2 % (ref 11.5–15.5)
WBC: 18.3 10*3/uL — ABNORMAL HIGH (ref 4.0–10.5)
nRBC: 0 % (ref 0.0–0.2)

## 2022-02-24 LAB — BETA-HYDROXYBUTYRIC ACID
Beta-Hydroxybutyric Acid: 7.89 mmol/L — ABNORMAL HIGH (ref 0.05–0.27)
Beta-Hydroxybutyric Acid: 1.96 mmol/L — ABNORMAL HIGH (ref 0.05–0.27)

## 2022-02-24 LAB — GLUCOSE, CAPILLARY
Glucose-Capillary: 113 mg/dL — ABNORMAL HIGH (ref 70–99)
Glucose-Capillary: 116 mg/dL — ABNORMAL HIGH (ref 70–99)
Glucose-Capillary: 157 mg/dL — ABNORMAL HIGH (ref 70–99)
Glucose-Capillary: 198 mg/dL — ABNORMAL HIGH (ref 70–99)
Glucose-Capillary: 208 mg/dL — ABNORMAL HIGH (ref 70–99)
Glucose-Capillary: 273 mg/dL — ABNORMAL HIGH (ref 70–99)
Glucose-Capillary: 94 mg/dL (ref 70–99)
Glucose-Capillary: 99 mg/dL (ref 70–99)

## 2022-02-24 LAB — BLOOD GAS, ARTERIAL
Acid-base deficit: 13.2 mmol/L — ABNORMAL HIGH (ref 0.0–2.0)
Bicarbonate: 13.6 mmol/L — ABNORMAL LOW (ref 20.0–28.0)
Drawn by: 27733
FIO2: 21 %
O2 Saturation: 37.1 %
Patient temperature: 37.1
pCO2 arterial: 34 mmHg (ref 32–48)
pH, Arterial: 7.21 — ABNORMAL LOW (ref 7.35–7.45)
pO2, Arterial: <31 mmHg — CL (ref 83–108)

## 2022-02-24 LAB — CREATININE, SERUM
Creatinine, Ser: 1.52 mg/dL — ABNORMAL HIGH (ref 0.44–1.00)
GFR, Estimated: 41 mL/min — ABNORMAL LOW (ref >60)

## 2022-02-24 LAB — BASIC METABOLIC PANEL
Anion gap: 24 — ABNORMAL HIGH (ref 5–15)
Anion gap: 22 — ABNORMAL HIGH (ref 5–15)
Anion gap: 7 (ref 5–15)
BUN: 28 mg/dL — ABNORMAL HIGH (ref 6–20)
BUN: 27 mg/dL — ABNORMAL HIGH (ref 6–20)
BUN: 23 mg/dL — ABNORMAL HIGH (ref 6–20)
CO2: 11 mmol/L — ABNORMAL LOW (ref 22–32)
CO2: 15 mmol/L — ABNORMAL LOW (ref 22–32)
CO2: 25 mmol/L (ref 22–32)
Calcium: 9.1 mg/dL (ref 8.9–10.3)
Calcium: 9.4 mg/dL (ref 8.9–10.3)
Calcium: 8.9 mg/dL (ref 8.9–10.3)
Chloride: 96 mmol/L — ABNORMAL LOW (ref 98–111)
Chloride: 98 mmol/L (ref 98–111)
Chloride: 102 mmol/L (ref 98–111)
Creatinine, Ser: 1.94 mg/dL — ABNORMAL HIGH (ref 0.44–1.00)
Creatinine, Ser: 1.7 mg/dL — ABNORMAL HIGH (ref 0.44–1.00)
Creatinine, Ser: 1.12 mg/dL — ABNORMAL HIGH (ref 0.44–1.00)
GFR, Estimated: 30 mL/min — ABNORMAL LOW (ref >60)
GFR, Estimated: 35 mL/min — ABNORMAL LOW (ref >60)
GFR, Estimated: 58 mL/min — ABNORMAL LOW (ref >60)
Glucose, Bld: 543 mg/dL (ref 70–99)
Glucose, Bld: 380 mg/dL — ABNORMAL HIGH (ref 70–99)
Glucose, Bld: 106 mg/dL — ABNORMAL HIGH (ref 70–99)
Potassium: 4.2 mmol/L (ref 3.5–5.1)
Potassium: 4.5 mmol/L (ref 3.5–5.1)
Potassium: 3.7 mmol/L (ref 3.5–5.1)
Sodium: 131 mmol/L — ABNORMAL LOW (ref 135–145)
Sodium: 135 mmol/L (ref 135–145)
Sodium: 134 mmol/L — ABNORMAL LOW (ref 135–145)

## 2022-02-24 LAB — MRSA NEXT GEN BY PCR, NASAL: MRSA by PCR Next Gen: DETECTED — AB

## 2022-02-24 LAB — VALPROIC ACID LEVEL: Valproic Acid Lvl: 26 ug/mL — ABNORMAL LOW (ref 50.0–100.0)

## 2022-02-24 LAB — HIV ANTIBODY (ROUTINE TESTING W REFLEX): HIV Screen 4th Generation wRfx: NONREACTIVE

## 2022-02-24 MED ORDER — LACTATED RINGERS IV SOLN: INTRAVENOUS | Status: DC

## 2022-02-24 MED ORDER — TRAZODONE HCL 50 MG PO TABS
50.0000 mg | ORAL_TABLET | Freq: Every day | ORAL | Status: DC
  Administered 2022-02-25: 50 mg via ORAL
  Filled 2022-02-24: qty 1

## 2022-02-24 MED ORDER — MUPIROCIN 2 % EX OINT
1.0000 | TOPICAL_OINTMENT | Freq: Two times a day (BID) | CUTANEOUS | Status: DC
  Administered 2022-02-24 – 2022-02-26 (×4): 1 via NASAL
  Filled 2022-02-24 (×2): qty 22

## 2022-02-24 MED ORDER — DEXTROSE IN LACTATED RINGERS 5 % IV SOLN: INTRAVENOUS | Status: DC

## 2022-02-24 MED ORDER — HEPARIN SODIUM (PORCINE) 5000 UNIT/ML IJ SOLN
5000.0000 [IU] | Freq: Three times a day (TID) | INTRAMUSCULAR | Status: DC
  Administered 2022-02-24 – 2022-02-25 (×3): 5000 [IU] via SUBCUTANEOUS
  Filled 2022-02-24 (×3): qty 1

## 2022-02-24 MED ORDER — CHLORHEXIDINE GLUCONATE CLOTH 2 % EX PADS
6.0000 | MEDICATED_PAD | Freq: Every day | CUTANEOUS | Status: DC
  Administered 2022-02-24 – 2022-02-25 (×2): 6 via TOPICAL

## 2022-02-24 MED ORDER — INSULIN REGULAR(HUMAN) IN NACL 100-0.9 UT/100ML-% IV SOLN
INTRAVENOUS | Status: DC
  Administered 2022-02-24: 9.5 [IU]/h via INTRAVENOUS
  Filled 2022-02-24: qty 100

## 2022-02-24 MED ORDER — DIVALPROEX SODIUM 250 MG PO DR TAB
250.0000 mg | DELAYED_RELEASE_TABLET | Freq: Two times a day (BID) | ORAL | Status: DC
  Administered 2022-02-24 – 2022-02-26 (×5): 250 mg via ORAL
  Filled 2022-02-24 (×5): qty 1

## 2022-02-24 MED ORDER — DEXTROSE 50 % IV SOLN: 0.0000 mL | INTRAVENOUS | Status: DC | PRN

## 2022-02-24 MED ORDER — LACTATED RINGERS IV BOLUS
20.0000 mL/kg | Freq: Once | INTRAVENOUS | Status: AC
  Administered 2022-02-24: 1330 mL via INTRAVENOUS

## 2022-02-24 MED ORDER — POTASSIUM CHLORIDE 10 MEQ/100ML IV SOLN: 10.0000 meq | INTRAVENOUS | Status: DC

## 2022-02-24 NOTE — ED Provider Notes (Signed)
[facility] EMERGENCY DEPARTMENT Provider Note   CSN: [id number] Arrival date & time: 02/24/22  1117     History  Chief Complaint  Patient presents with   Hyperglycemia    Bobetta Korf is a 56 y.o. female.   Hyperglycemia   This patient is a very pleasant 56 year old female, history of insulin requiring diabetes, cholesterol and hypertension.  Currently living at [facility], presents to the hospital today with a complaint of hyperglycemia.  The patient was noted to be slightly hypotensive dehydrated appearing and had ketones in her urine at her nursing facility with a blood sugar that was continuing to rise that she was sent to the hospital.  Found to be slightly hypotensive by paramedics.  Given 500 cc of normal saline prehospital.  Patient is not able to contribute much in the way of information to her history  Home Medications Prior to Admission medications   Medication Sig Start Date End Date Taking? Authorizing Provider  acetaminophen (TYLENOL) 325 MG tablet Take 2 tablets (650 mg total) by mouth every 6 (six) hours as needed for mild pain, fever or headache. 04/29/21  Yes Russella Dar, NP  albuterol (VENTOLIN HFA) 108 (90 Base) MCG/ACT inhaler Inhale 2 puffs into the lungs every 4 (four) hours as needed for wheezing or shortness of breath. 04/29/21  Yes Russella Dar, NP  AMINO ACIDS-PROTEIN HYDROLYS PO Take 30 mLs by mouth in the morning, at noon, and at bedtime.   Yes [provider]  amLODipine (NORVASC) 5 MG tablet Take 1 tablet (5 mg total) by mouth daily. 04/30/21  Yes Russella Dar, NP  atorvastatin (LIPITOR) 20 MG tablet Take 1 tablet (20 mg total) by mouth daily. Patient taking differently: Take 20 mg by mouth at bedtime. 04/29/21  Yes Russella Dar, NP  benzocaine (ANBESOL) 10 % mucosal gel Use as directed 1 application. in the mouth or throat every 2 (two) hours as needed for mouth pain (Gums).   Yes [provider]  bisacodyl  (DULCOLAX) 10 MG suppository Place 1 suppository (10 mg total) rectally daily as needed for moderate constipation or severe constipation. 04/29/21  Yes Russella Dar, NP  divalproex (DEPAKOTE) 250 MG DR tablet Take 250 mg by mouth 2 (two) times daily.   Yes [provider]  Emollient (EUCERIN) lotion Apply 1 application. topically every 12 (twelve) hours as needed for dry skin (apply to feet and legs for hydration).   Yes [provider]  insulin detemir (LEVEMIR) 100 UNIT/ML injection Inject 10-25 Units into the skin See admin instructions. Take 25 units in the morning and 10 units at bedtime   Yes [provider]  insulin lispro (HUMALOG) 100 UNIT/ML injection Inject 12 Units into the skin 3 (three) times daily before meals.   Yes [provider]  ipratropium-albuterol (DUONEB) 0.5-2.5 (3) MG/3ML SOLN Take 3 mLs by nebulization every 6 (six) hours as needed. Patient taking differently: Take 3 mLs by nebulization every 6 (six) hours as needed (SOB or Wheezing). 04/29/21  Yes Russella Dar, NP  metFORMIN (GLUCOPHAGE) 500 MG tablet Take 500 mg by mouth 2 (two) times daily with a meal.   Yes [provider]  metoprolol tartrate (LOPRESSOR) 50 MG tablet Take 1 tablet (50 mg total) by mouth 2 (two) times daily. 04/29/21  Yes Russella Dar, NP  Multiple Vitamins-Minerals (MULTIVITAMIN WITH MINERALS) tablet Take 1 tablet by mouth daily.   Yes [provider]  polyethylene glycol (MIRALAX /  GLYCOLAX) 17 g packet Take 17 g by mouth daily. 04/30/21  Yes Russella Dar, NP  senna (SENOKOT) 8.6 MG TABS tablet Take 1 tablet (8.6 mg total) by mouth 2 (two) times daily. 04/29/21  Yes Russella Dar, NP  traZODone (DESYREL) 50 MG tablet Take 50 mg by mouth at bedtime.   Yes [provider]      Allergies    Erythromycin    Review of Systems   Review of Systems  All other systems reviewed and are negative.   Physical Exam Updated Vital  Signs BP (!) 100/42   Pulse 96   Temp 98.9 F (37.2 C)   Resp (!) 21   Ht 1.676 m (5\' 6" )   Wt 66.5 kg   LMP 01/26/2017 (Approximate)   SpO2 97%   BMI 23.66 kg/m  Physical Exam Vitals and nursing note reviewed.  Constitutional:      General: She is in acute distress.     Appearance: She is well-developed.  HENT:     Head: Normocephalic and atraumatic.     Mouth/Throat:     Mouth: Mucous membranes are dry.     Pharynx: No oropharyngeal exudate.  Eyes:     General: No scleral icterus.       Right eye: No discharge.        Left eye: No discharge.     Conjunctiva/sclera: Conjunctivae normal.     Pupils: Pupils are equal, round, and reactive to light.  Neck:     Thyroid: No thyromegaly.     Vascular: No JVD.  Cardiovascular:     Rate and Rhythm: Regular rhythm. Tachycardia present.     Heart sounds: Normal heart sounds. No murmur heard.    No friction rub. No gallop.  Pulmonary:     Effort: Pulmonary effort is normal. No respiratory distress.     Breath sounds: Normal breath sounds. No wheezing or rales.  Abdominal:     General: Bowel sounds are normal. There is no distension.     Palpations: Abdomen is soft. There is no mass.     Tenderness: There is no abdominal tenderness.  Musculoskeletal:        General: No tenderness. Normal range of motion.     Cervical back: Normal range of motion and neck supple.     Right lower leg: No edema.     Left lower leg: No edema.  Lymphadenopathy:     Cervical: No cervical adenopathy.  Skin:    General: Skin is warm and dry.     Findings: No erythema or rash.  Neurological:     Mental Status: She is alert.     Coordination: Coordination normal.     Comments: Pleasantly confused but able to move all 4 extremities, generally weak  Psychiatric:        Behavior: Behavior normal.     ED Results / Procedures / Treatments   Labs (all labs ordered are listed, but only abnormal results are displayed) Labs Reviewed  BASIC METABOLIC  PANEL - Abnormal; Notable for the following components:      Result Value   Sodium 131 (*)    Chloride 96 (*)    CO2 11 (*)    Glucose, Bld 543 (*)    BUN 28 (*)    Creatinine, Ser 1.94 (*)    GFR, Estimated 30 (*)    Anion gap 24 (*)    All other components within normal limits  BETA-HYDROXYBUTYRIC ACID -  Abnormal; Notable for the following components:   Beta-Hydroxybutyric Acid 7.89 (*)    All other components within normal limits  CBC WITH DIFFERENTIAL/PLATELET - Abnormal; Notable for the following components:   WBC 20.0 (*)    Neutro Abs 16.4 (*)    Basophils Absolute 0.2 (*)    Abs Immature Granulocytes 0.40 (*)    All other components within normal limits  BLOOD GAS, ARTERIAL - Abnormal; Notable for the following components:   pH, Arterial 7.21 (*)    pO2, Arterial <31 (*)    Bicarbonate 13.6 (*)    Acid-base deficit 13.2 (*)    All other components within normal limits  VALPROIC ACID LEVEL - Abnormal; Notable for the following components:   Valproic Acid Lvl 26 (*)    All other components within normal limits  CBG MONITORING, ED - Abnormal; Notable for the following components:   Glucose-Capillary 546 (*)    All other components within normal limits  CBG MONITORING, ED - Abnormal; Notable for the following components:   Glucose-Capillary 425 (*)    All other components within normal limits  CBG MONITORING, ED - Abnormal; Notable for the following components:   Glucose-Capillary 458 (*)    All other components within normal limits  CBG MONITORING, ED - Abnormal; Notable for the following components:   Glucose-Capillary 405 (*)    All other components within normal limits  URINE CULTURE  BASIC METABOLIC PANEL  BASIC METABOLIC PANEL  BASIC METABOLIC PANEL  BETA-HYDROXYBUTYRIC ACID  URINALYSIS, ROUTINE W REFLEX MICROSCOPIC  POC URINE PREG, ED    EKG None  Radiology DG Chest Portable 1 View  Result Date: 02/24/2022 CLINICAL DATA:  Altered mental status,  diabetic ketoacidosis EXAM: PORTABLE CHEST 1 VIEW COMPARISON:  04/21/2021 FINDINGS: Transverse diameter of heart is slightly increased which may be partly due to poor inspiration. There are no signs of alveolar pulmonary edema or focal pulmonary consolidation. There is slight prominence of interstitial markings in the both lower lung fields. There is no pleural effusion or pneumothorax. IMPRESSION: There are no signs of alveolar pulmonary edema or focal pulmonary consolidation. There is prominence of interstitial markings in both lower lung fields which may suggest crowding of markings due to poor inspiration or mild interstitial edema or interstitial pneumonia. Electronically Signed   By: Ernie Avena M.D.   On: 02/24/2022 12:52    Procedures .Critical Care  Performed by: Eber Hong, MD Authorized by: Eber Hong, MD   Critical care provider statement:    Critical care time (minutes):  30   Critical care time was exclusive of:  Separately billable procedures and treating other patients and teaching time   Critical care was necessary to treat or prevent imminent or life-threatening deterioration of the following conditions:  Endocrine crisis and metabolic crisis   Critical care was time spent personally by me on the following activities:  Development of treatment plan with patient or surrogate, discussions with consultants, evaluation of patient's response to treatment, examination of patient, ordering and review of laboratory studies, ordering and review of radiographic studies, ordering and performing treatments and interventions, pulse oximetry, re-evaluation of patient's condition, review of old charts and obtaining history from patient or surrogate   I assumed direction of critical care for this patient from another provider in my specialty: no     Care discussed with: admitting provider   Comments:           Medications Ordered in ED Medications  insulin regular, human  (  MYXREDLIN) 100 units/ 100 mL infusion (4.4 Units/hr Intravenous Infusion Verify 02/24/22 1232)  lactated ringers infusion ( Intravenous New Bag/Given 02/24/22 1348)  dextrose 5 % in lactated ringers infusion (has no administration in time range)  dextrose 50 % solution 0-50 mL (has no administration in time range)  lactated ringers bolus 1,330 mL (0 mLs Intravenous Stopped 02/24/22 1347)    ED Course/ Medical Decision Making/ A&P                           Medical Decision Making Amount and/or Complexity of Data Reviewed Labs: ordered. Radiology: ordered.  Risk Prescription drug management. Decision regarding hospitalization.   This patient presents to the ED for concern of hyperglycemia, this involves an extensive number of treatment options, and is a complaint that carries with it a high risk of complications and morbidity.  The differential diagnosis includes DKA, hyperglycemia, dehydration, renal failure, she is hypotensive and tachycardic   Co morbidities that complicate the patient evaluation  Prior history of "brittle diabetes, bipolar disorder and polysubstance abuse, history of metabolic encephalopathy with prolonged hospitalizations in the past"   Additional history obtained:  Additional history obtained from electronic medical record External records from outside source obtained and reviewed including admissions and discharge summaries most recently was August 2022   Lab Tests:  I Ordered, and personally interpreted labs.  The pertinent results include: CBC showing a leukocytosis of 20,000, metabolic panel showing an acute kidney injury with a creatinine that is almost doubled from baseline, urinalysis is pending but required in and out catheterization.  She has beta-hydroxybutyrate in the blood, ketones in the urine and an anion gap acidosis consistent with DKA   Imaging Studies ordered:  I ordered imaging studies including chest x-ray I independently visualized and  interpreted imaging which showed no acute obvious findings I agree with the radiologist interpretation   Cardiac Monitoring: / EKG:  The patient was maintained on a cardiac monitor.  I personally viewed and interpreted the cardiac monitored which showed an underlying rhythm of: Borderline tachycardia   Consultations Obtained:  I requested consultation with the hospitalist Dr. Sherryll Burger,  and discussed lab and imaging findings as well as pertinent plan - they recommend: Admission to the hospital   Problem List / ED Course / Critical interventions / Medication management  Diabetic ketoacidosis with an acute kidney injury and hypotension I ordered medication including IV fluids and an insulin drip for resuscitation from DKA Reevaluation of the patient after these medicines showed that the patient improved I have reviewed the patients home medicines and have made adjustments as needed   Social Determinants of Health:  DKA, nursing facility   Test / Admission - Considered:  Admission to higher level of care   This patient is critically ill, I discussed her care with the hospitalist, they will admit to higher level of care, on insulin drip         Final Clinical Impression(s) / ED Diagnoses Final diagnoses:  Diabetic ketoacidosis without coma associated with type 1 diabetes mellitus (HCC)  AKI (acute kidney injury) [facility])    Rx / DC Orders ED Discharge Orders     None         Eber Hong, MD 02/24/22 1419

## 2022-02-24 NOTE — ED Triage Notes (Signed)
Pt brought in by [facility] for hyperglycemia and ams; pt is from [facility] and they checked her cbg which read high with facility;  Pt given of NS en route by ems  Pt denies any pain

## 2022-02-24 NOTE — H&P (Signed)
History and Physical    Carolyn Norton [id number] DOB: March 10, 1966 DOA: 02/24/2022  PCP: System, Provider Not In   Patient coming from: [facility]  Chief Complaint: Hyperglycemia  HPI: Carolyn Norton is a 56 y.o. female with medical history significant for IDDM, dyslipidemia, hypertension, depression, and bipolar disorder who presented to the ED from her SNF on account of significant hyperglycemia.  She was also noted to be slightly hypotensive and appeared dehydrated.  SNF had tested her urine and apparently was noted to have ketones.  She was given 500 mL fluid bolus in route by EMS.  Patient cannot give any further history given her current level of confusion.   ED Course: Vital signs with some initial soft blood pressure readings which are now stabilizing after fluid bolus.  Leukocytosis of 20,000 noted, sodium 131, bicarbonate 11, creatinine 1.94 and BUN 28.  Glucose is 543.  Urine has just been collected and is currently pending for urine analysis.  Chest x-ray with some mild interstitial edema noted.  Patient was started on IV fluid bolus as well as insulin drip in the ED.  Review of Systems: Could not be obtained given current patient condition.  Past Medical History:  Diagnosis Date   Alzheimer disease ([place])    from facility diagnosis   Anxiety    Asthma    Bipolar disorder ([place])    Chronic kidney disease    stage 1- per facility   COVID    History of   Depression    Diabetes mellitus without complication ([place])    Hyperlipidemia    Hypertension    Seasonal allergies     Past Surgical History:  Procedure Laterality Date   BREAST EXCISIONAL BIOPSY Right    BREAST SURGERY     right breast     reports that she quit smoking about 10 months ago. Her smoking use included cigarettes. She smoked an average of .5 packs per day. She has never used smokeless tobacco. She reports current drug use. Drug: Cocaine. She reports that she does not drink  alcohol.  Allergies  Allergen Reactions   Erythromycin Other (See Comments)    Childhood reaction    Family History  Problem Relation Age of Onset   Hypertension Mother    Cancer Mother    Kidney cancer Mother    Cancer Father        prostate   Breast cancer Paternal Grandmother    Leukemia Brother     Prior to Admission medications   Medication Sig Start Date End Date Taking? Authorizing Provider  acetaminophen (TYLENOL) 325 MG tablet Take 2 tablets (650 mg total) by mouth every 6 (six) hours as needed for mild pain, fever or headache. 04/29/21  Yes Samella Parr, NP  albuterol (VENTOLIN HFA) 108 (90 Base) MCG/ACT inhaler Inhale 2 puffs into the lungs every 4 (four) hours as needed for wheezing or shortness of breath. 04/29/21  Yes Samella Parr, NP  AMINO ACIDS-PROTEIN HYDROLYS PO Take 30 mLs by mouth in the morning, at noon, and at bedtime.   Yes [provider]  amLODipine (NORVASC) 5 MG tablet Take 1 tablet (5 mg total) by mouth daily. 04/30/21  Yes Samella Parr, NP  atorvastatin (LIPITOR) 20 MG tablet Take 1 tablet (20 mg total) by mouth daily. Patient taking differently: Take 20 mg by mouth at bedtime. 04/29/21  Yes Samella Parr, NP  benzocaine (ANBESOL) 10 % mucosal gel Use as directed 1 application.  in the mouth or throat every 2 (two) hours as needed for mouth pain (Gums).   Yes [provider]  bisacodyl (DULCOLAX) 10 MG suppository Place 1 suppository (10 mg total) rectally daily as needed for moderate constipation or severe constipation. 04/29/21  Yes Samella Parr, NP  divalproex (DEPAKOTE) 250 MG DR tablet Take 250 mg by mouth 2 (two) times daily.   Yes [provider]  Emollient (EUCERIN) lotion Apply 1 application. topically every 12 (twelve) hours as needed for dry skin (apply to feet and legs for hydration).   Yes [provider]  insulin detemir (LEVEMIR) 100 UNIT/ML injection Inject 10-25 Units into the skin See admin  instructions. Take 25 units in the morning and 10 units at bedtime   Yes [provider]  insulin lispro (HUMALOG) 100 UNIT/ML injection Inject 12 Units into the skin 3 (three) times daily before meals.   Yes [provider]  ipratropium-albuterol (DUONEB) 0.5-2.5 (3) MG/3ML SOLN Take 3 mLs by nebulization every 6 (six) hours as needed. Patient taking differently: Take 3 mLs by nebulization every 6 (six) hours as needed (SOB or Wheezing). 04/29/21  Yes Samella Parr, NP  metFORMIN (GLUCOPHAGE) 500 MG tablet Take 500 mg by mouth 2 (two) times daily with a meal.   Yes [provider]  metoprolol tartrate (LOPRESSOR) 50 MG tablet Take 1 tablet (50 mg total) by mouth 2 (two) times daily. 04/29/21  Yes Samella Parr, NP  Multiple Vitamins-Minerals (MULTIVITAMIN WITH MINERALS) tablet Take 1 tablet by mouth daily.   Yes [provider]  polyethylene glycol (MIRALAX / GLYCOLAX) 17 g packet Take 17 g by mouth daily. 04/30/21  Yes Samella Parr, NP  senna (SENOKOT) 8.6 MG TABS tablet Take 1 tablet (8.6 mg total) by mouth 2 (two) times daily. 04/29/21  Yes Samella Parr, NP  traZODone (DESYREL) 50 MG tablet Take 50 mg by mouth at bedtime.   Yes [provider]    Physical Exam: Vitals:   02/24/22 1125 02/24/22 1158 02/24/22 1330 02/24/22 1430  BP: (!) 92/55 (!) 85/59 (!) 100/42 (!) 98/48  Pulse: 96 90 96 99  Resp: 19 20 (!) 21 14  Temp: 98.9 F (37.2 C)     SpO2: 98% 100% 97% 100%  Weight:      Height:        Constitutional: NAD, calm, comfortable, confused Vitals:   02/24/22 1125 02/24/22 1158 02/24/22 1330 02/24/22 1430  BP: (!) 92/55 (!) 85/59 (!) 100/42 (!) 98/48  Pulse: 96 90 96 99  Resp: 19 20 (!) 21 14  Temp: 98.9 F (37.2 C)     SpO2: 98% 100% 97% 100%  Weight:      Height:       Eyes: lids and conjunctivae normal Neck: normal, supple, dry oral mucosa Respiratory: clear to auscultation bilaterally. Normal respiratory effort. No  accessory muscle use.  Cardiovascular: Regular rate and rhythm, no murmurs. Abdomen: no tenderness, no distention. Bowel sounds positive.  Musculoskeletal:  No edema. Skin: no rashes, lesions, ulcers.  Psychiatric: Flat affect  Labs on Admission: I have personally reviewed following labs and imaging studies  CBC: Recent Labs  Lab 02/24/22 1139  WBC 20.0*  NEUTROABS 16.4*  HGB 13.4  HCT 40.4  MCV 86.7  PLT 123456   Basic Metabolic Panel: Recent Labs  Lab 02/24/22 1139  NA 131*  K 4.2  CL 96*  CO2 11*  GLUCOSE 543*  BUN 28*  CREATININE  1.94*  CALCIUM 9.1   GFR: Estimated Creatinine Clearance: 31 mL/min (A) (by C-G formula based on SCr of 1.94 mg/dL (H)). Liver Function Tests: No results for input(s): "AST", "ALT", "ALKPHOS", "BILITOT", "PROT", "ALBUMIN" in the last 168 hours. No results for input(s): "LIPASE", "AMYLASE" in the last 168 hours. No results for input(s): "AMMONIA" in the last 168 hours. Coagulation Profile: No results for input(s): "INR", "PROTIME" in the last 168 hours. Cardiac Enzymes: No results for input(s): "CKTOTAL", "CKMB", "CKMBINDEX", "TROPONINI" in the last 168 hours. BNP (last 3 results) No results for input(s): "PROBNP" in the last 8760 hours. HbA1C: No results for input(s): "HGBA1C" in the last 72 hours. CBG: Recent Labs  Lab 02/24/22 1121 02/24/22 1227 02/24/22 1302 02/24/22 1339 02/24/22 1426  GLUCAP 546* 425* 458* 405* 292*   Lipid Profile: No results for input(s): "CHOL", "HDL", "LDLCALC", "TRIG", "CHOLHDL", "LDLDIRECT" in the last 72 hours. Thyroid Function Tests: No results for input(s): "TSH", "T4TOTAL", "FREET4", "T3FREE", "THYROIDAB" in the last 72 hours. Anemia Panel: No results for input(s): "VITAMINB12", "FOLATE", "FERRITIN", "TIBC", "IRON", "RETICCTPCT" in the last 72 hours. Urine analysis:    Component Value Date/Time   COLORURINE YELLOW 03/03/2021 0810   APPEARANCEUR CLEAR 03/03/2021 0810   LABSPEC 1.022  03/03/2021 0810   PHURINE 5.0 03/03/2021 0810   GLUCOSEU >=500 (A) 03/03/2021 0810   HGBUR NEGATIVE 03/03/2021 0810   BILIRUBINUR NEGATIVE 03/03/2021 0810   KETONESUR 80 (A) 03/03/2021 0810   PROTEINUR NEGATIVE 03/03/2021 0810   UROBILINOGEN 0.2 07/20/2014 2022   NITRITE NEGATIVE 03/03/2021 0810   LEUKOCYTESUR SMALL (A) 03/03/2021 0810    Radiological Exams on Admission: DG Chest Portable 1 View  Result Date: 02/24/2022 CLINICAL DATA:  Altered mental status, diabetic ketoacidosis EXAM: PORTABLE CHEST 1 VIEW COMPARISON:  04/21/2021 FINDINGS: Transverse diameter of heart is slightly increased which may be partly due to poor inspiration. There are no signs of alveolar pulmonary edema or focal pulmonary consolidation. There is slight prominence of interstitial markings in the both lower lung fields. There is no pleural effusion or pneumothorax. IMPRESSION: There are no signs of alveolar pulmonary edema or focal pulmonary consolidation. There is prominence of interstitial markings in both lower lung fields which may suggest crowding of markings due to poor inspiration or mild interstitial edema or interstitial pneumonia. Electronically Signed   By: Ernie Avena M.D.   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 -Monitor repeat labs as ordered -Transition once able to tolerate p.o. and gap is closed -Diabetes coordinator consultation -check hemoglobin A1c  Acute metabolic encephalopathy -Appears to have some baseline cognitive disorder related to NPH -Secondary to above -UA collected and  pending  Pseudohyponatremia -Secondary to above -Continue to monitor on repeat lab work  AKI -Likely prerenal in the setting of dehydration with DKA -Continue IV fluid hydration and monitor  Leukocytosis -Likely reactive versus related to UTI  Hypertension-currently with hypotension -Hold home blood pressure agents given relative hypotension -Monitor on SDU  Dyslipidemia -Hold home medications for now  Bipolar disorder/depression -Continue home Depakote and trazodone   DVT prophylaxis: Heparin Code  Status: Full Family Communication: None at bedside Disposition Plan:Admit for DKA Consults called:None Admission status: Obs, SDU  Severity of Illness: The appropriate patient status for this patient is OBSERVATION. Observation status is judged to be reasonable and necessary in order to provide the required intensity of service to ensure the patient's safety. The patient's presenting symptoms, physical exam findings, and initial radiographic and laboratory data in the context of their medical condition is felt to place them at decreased risk for further clinical deterioration. Furthermore, it is anticipated that the patient will be medically stable for discharge from the hospital within 2 midnights of admission.    Jazira Maloney D Sherryll Burger DO [facility]  If 7PM-7AM, please contact night-coverage [email or web address]  02/24/2022, 2:58 PM

## 2022-02-25 DIAGNOSIS — R0981 Nasal congestion: Secondary | ICD-10-CM | POA: Diagnosis not present

## 2022-02-25 DIAGNOSIS — I10 Essential (primary) hypertension: Secondary | ICD-10-CM | POA: Diagnosis not present

## 2022-02-25 DIAGNOSIS — R5381 Other malaise: Secondary | ICD-10-CM | POA: Diagnosis not present

## 2022-02-25 DIAGNOSIS — E1111 Type 2 diabetes mellitus with ketoacidosis with coma: Secondary | ICD-10-CM | POA: Diagnosis not present

## 2022-02-25 DIAGNOSIS — E43 Unspecified severe protein-calorie malnutrition: Secondary | ICD-10-CM | POA: Diagnosis not present

## 2022-02-25 DIAGNOSIS — R400 Somnolence: Secondary | ICD-10-CM | POA: Diagnosis not present

## 2022-02-25 DIAGNOSIS — R531 Weakness: Secondary | ICD-10-CM | POA: Diagnosis not present

## 2022-02-25 DIAGNOSIS — Z8249 Family history of ischemic heart disease and other diseases of the circulatory system: Secondary | ICD-10-CM | POA: Diagnosis not present

## 2022-02-25 DIAGNOSIS — G300 Alzheimer's disease with early onset: Secondary | ICD-10-CM | POA: Diagnosis not present

## 2022-02-25 DIAGNOSIS — Z87891 Personal history of nicotine dependence: Secondary | ICD-10-CM | POA: Diagnosis not present

## 2022-02-25 DIAGNOSIS — E1110 Type 2 diabetes mellitus with ketoacidosis without coma: Secondary | ICD-10-CM | POA: Diagnosis not present

## 2022-02-25 DIAGNOSIS — E876 Hypokalemia: Secondary | ICD-10-CM | POA: Diagnosis present

## 2022-02-25 DIAGNOSIS — E1122 Type 2 diabetes mellitus with diabetic chronic kidney disease: Secondary | ICD-10-CM | POA: Diagnosis present

## 2022-02-25 DIAGNOSIS — G309 Alzheimer's disease, unspecified: Secondary | ICD-10-CM | POA: Diagnosis present

## 2022-02-25 DIAGNOSIS — F0280 Dementia in other diseases classified elsewhere without behavioral disturbance: Secondary | ICD-10-CM | POA: Diagnosis present

## 2022-02-25 DIAGNOSIS — G9341 Metabolic encephalopathy: Secondary | ICD-10-CM | POA: Diagnosis not present

## 2022-02-25 DIAGNOSIS — R600 Localized edema: Secondary | ICD-10-CM | POA: Diagnosis not present

## 2022-02-25 DIAGNOSIS — E1169 Type 2 diabetes mellitus with other specified complication: Secondary | ICD-10-CM | POA: Diagnosis not present

## 2022-02-25 DIAGNOSIS — M6281 Muscle weakness (generalized): Secondary | ICD-10-CM | POA: Diagnosis not present

## 2022-02-25 DIAGNOSIS — J45909 Unspecified asthma, uncomplicated: Secondary | ICD-10-CM | POA: Diagnosis not present

## 2022-02-25 DIAGNOSIS — I959 Hypotension, unspecified: Secondary | ICD-10-CM | POA: Diagnosis present

## 2022-02-25 DIAGNOSIS — E785 Hyperlipidemia, unspecified: Secondary | ICD-10-CM | POA: Diagnosis not present

## 2022-02-25 DIAGNOSIS — Z7401 Bed confinement status: Secondary | ICD-10-CM | POA: Diagnosis not present

## 2022-02-25 DIAGNOSIS — R062 Wheezing: Secondary | ICD-10-CM | POA: Diagnosis not present

## 2022-02-25 DIAGNOSIS — F319 Bipolar disorder, unspecified: Secondary | ICD-10-CM | POA: Diagnosis not present

## 2022-02-25 DIAGNOSIS — N179 Acute kidney failure, unspecified: Secondary | ICD-10-CM

## 2022-02-25 DIAGNOSIS — G912 (Idiopathic) normal pressure hydrocephalus: Secondary | ICD-10-CM | POA: Diagnosis not present

## 2022-02-25 DIAGNOSIS — R4182 Altered mental status, unspecified: Secondary | ICD-10-CM | POA: Diagnosis not present

## 2022-02-25 DIAGNOSIS — F29 Unspecified psychosis not due to a substance or known physiological condition: Secondary | ICD-10-CM | POA: Diagnosis not present

## 2022-02-25 DIAGNOSIS — I129 Hypertensive chronic kidney disease with stage 1 through stage 4 chronic kidney disease, or unspecified chronic kidney disease: Secondary | ICD-10-CM | POA: Diagnosis not present

## 2022-02-25 DIAGNOSIS — G6281 Critical illness polyneuropathy: Secondary | ICD-10-CM | POA: Diagnosis not present

## 2022-02-25 DIAGNOSIS — H8110 Benign paroxysmal vertigo, unspecified ear: Secondary | ICD-10-CM | POA: Diagnosis not present

## 2022-02-25 DIAGNOSIS — E1010 Type 1 diabetes mellitus with ketoacidosis without coma: Secondary | ICD-10-CM | POA: Diagnosis not present

## 2022-02-25 DIAGNOSIS — R279 Unspecified lack of coordination: Secondary | ICD-10-CM | POA: Diagnosis not present

## 2022-02-25 DIAGNOSIS — R4189 Other symptoms and signs involving cognitive functions and awareness: Secondary | ICD-10-CM | POA: Diagnosis not present

## 2022-02-25 DIAGNOSIS — Z794 Long term (current) use of insulin: Secondary | ICD-10-CM | POA: Diagnosis not present

## 2022-02-25 DIAGNOSIS — Z79899 Other long term (current) drug therapy: Secondary | ICD-10-CM | POA: Diagnosis not present

## 2022-02-25 DIAGNOSIS — E860 Dehydration: Secondary | ICD-10-CM | POA: Diagnosis present

## 2022-02-25 DIAGNOSIS — G6289 Other specified polyneuropathies: Secondary | ICD-10-CM | POA: Diagnosis not present

## 2022-02-25 DIAGNOSIS — Z881 Allergy status to other antibiotic agents status: Secondary | ICD-10-CM | POA: Diagnosis not present

## 2022-02-25 DIAGNOSIS — Z7984 Long term (current) use of oral hypoglycemic drugs: Secondary | ICD-10-CM | POA: Diagnosis not present

## 2022-02-25 DIAGNOSIS — E1165 Type 2 diabetes mellitus with hyperglycemia: Secondary | ICD-10-CM | POA: Diagnosis not present

## 2022-02-25 DIAGNOSIS — F3132 Bipolar disorder, current episode depressed, moderate: Secondary | ICD-10-CM | POA: Diagnosis not present

## 2022-02-25 DIAGNOSIS — M25531 Pain in right wrist: Secondary | ICD-10-CM | POA: Diagnosis not present

## 2022-02-25 DIAGNOSIS — N181 Chronic kidney disease, stage 1: Secondary | ICD-10-CM | POA: Diagnosis present

## 2022-02-25 LAB — BASIC METABOLIC PANEL
Anion gap: 10 (ref 5–15)
Anion gap: 7 (ref 5–15)
Anion gap: 8 (ref 5–15)
BUN: 13 mg/dL (ref 6–20)
BUN: 20 mg/dL (ref 6–20)
BUN: 22 mg/dL — ABNORMAL HIGH (ref 6–20)
CO2: 24 mmol/L (ref 22–32)
CO2: 26 mmol/L (ref 22–32)
CO2: 27 mmol/L (ref 22–32)
Calcium: 8.5 mg/dL — ABNORMAL LOW (ref 8.9–10.3)
Calcium: 8.6 mg/dL — ABNORMAL LOW (ref 8.9–10.3)
Calcium: 8.7 mg/dL — ABNORMAL LOW (ref 8.9–10.3)
Chloride: 102 mmol/L (ref 98–111)
Chloride: 103 mmol/L (ref 98–111)
Chloride: 99 mmol/L (ref 98–111)
Creatinine, Ser: 0.8 mg/dL (ref 0.44–1.00)
Creatinine, Ser: 0.88 mg/dL (ref 0.44–1.00)
Creatinine, Ser: 0.98 mg/dL (ref 0.44–1.00)
GFR, Estimated: >60 mL/min (ref >60)
GFR, Estimated: >60 mL/min (ref >60)
GFR, Estimated: >60 mL/min (ref >60)
Glucose, Bld: 100 mg/dL — ABNORMAL HIGH (ref 70–99)
Glucose, Bld: 212 mg/dL — ABNORMAL HIGH (ref 70–99)
Glucose, Bld: 96 mg/dL (ref 70–99)
Potassium: 3.3 mmol/L — ABNORMAL LOW (ref 3.5–5.1)
Potassium: 3.3 mmol/L — ABNORMAL LOW (ref 3.5–5.1)
Potassium: 4.3 mmol/L (ref 3.5–5.1)
Sodium: 133 mmol/L — ABNORMAL LOW (ref 135–145)
Sodium: 136 mmol/L (ref 135–145)
Sodium: 137 mmol/L (ref 135–145)

## 2022-02-25 LAB — GLUCOSE, CAPILLARY
Glucose-Capillary: 101 mg/dL — ABNORMAL HIGH (ref 70–99)
Glucose-Capillary: 102 mg/dL — ABNORMAL HIGH (ref 70–99)
Glucose-Capillary: 174 mg/dL — ABNORMAL HIGH (ref 70–99)
Glucose-Capillary: 225 mg/dL — ABNORMAL HIGH (ref 70–99)
Glucose-Capillary: 276 mg/dL — ABNORMAL HIGH (ref 70–99)
Glucose-Capillary: 84 mg/dL (ref 70–99)
Glucose-Capillary: 91 mg/dL (ref 70–99)
Glucose-Capillary: 94 mg/dL (ref 70–99)
Glucose-Capillary: 94 mg/dL (ref 70–99)

## 2022-02-25 LAB — CBC
HCT: 32 % — ABNORMAL LOW (ref 36.0–46.0)
Hemoglobin: 10.7 g/dL — ABNORMAL LOW (ref 12.0–15.0)
MCH: 27.8 pg (ref 26.0–34.0)
MCHC: 33.4 g/dL (ref 30.0–36.0)
MCV: 83.1 fL (ref 80.0–100.0)
Platelets: 254 10*3/uL (ref 150–400)
RBC: 3.85 MIL/uL — ABNORMAL LOW (ref 3.87–5.11)
RDW: 14.4 % (ref 11.5–15.5)
WBC: 12.4 10*3/uL — ABNORMAL HIGH (ref 4.0–10.5)
nRBC: 0 % (ref 0.0–0.2)

## 2022-02-25 LAB — HEMOGLOBIN A1C
Hgb A1c MFr Bld: 8.9 % — ABNORMAL HIGH (ref 4.8–5.6)
Mean Plasma Glucose: 208.73 mg/dL

## 2022-02-25 LAB — BETA-HYDROXYBUTYRIC ACID: Beta-Hydroxybutyric Acid: 0.23 mmol/L (ref 0.05–0.27)

## 2022-02-25 LAB — MAGNESIUM
Magnesium: 1.1 mg/dL — ABNORMAL LOW (ref 1.7–2.4)
Magnesium: 1.9 mg/dL (ref 1.7–2.4)

## 2022-02-25 MED ORDER — POTASSIUM CHLORIDE CRYS ER 20 MEQ PO TBCR
40.0000 meq | EXTENDED_RELEASE_TABLET | Freq: Once | ORAL | Status: AC
  Administered 2022-02-25: 40 meq via ORAL
  Filled 2022-02-25: qty 2

## 2022-02-25 MED ORDER — MAGNESIUM SULFATE 4 GM/100ML IV SOLN
4.0000 g | Freq: Once | INTRAVENOUS | Status: AC
  Administered 2022-02-25: 4 g via INTRAVENOUS
  Filled 2022-02-25: qty 100

## 2022-02-25 MED ORDER — MORPHINE SULFATE (PF) 4 MG/ML IV SOLN
4.0000 mg | Freq: Once | INTRAVENOUS | Status: AC
  Administered 2022-02-26: 4 mg via INTRAVENOUS
  Filled 2022-02-25: qty 1

## 2022-02-25 MED ORDER — INSULIN GLARGINE-YFGN 100 UNIT/ML ~~LOC~~ SOLN
10.0000 [IU] | Freq: Every day | SUBCUTANEOUS | Status: DC
  Filled 2022-02-25: qty 0.1

## 2022-02-25 MED ORDER — POTASSIUM CHLORIDE 10 MEQ/100ML IV SOLN
10.0000 meq | INTRAVENOUS | Status: AC
  Administered 2022-02-25 (×3): 10 meq via INTRAVENOUS
  Filled 2022-02-25 (×3): qty 100

## 2022-02-25 MED ORDER — ENOXAPARIN SODIUM 40 MG/0.4ML IJ SOSY
40.0000 mg | PREFILLED_SYRINGE | INTRAMUSCULAR | Status: DC
  Administered 2022-02-25 – 2022-02-26 (×2): 40 mg via SUBCUTANEOUS
  Filled 2022-02-25 (×2): qty 0.4

## 2022-02-25 MED ORDER — INSULIN GLARGINE-YFGN 100 UNIT/ML ~~LOC~~ SOLN
5.0000 [IU] | Freq: Once | SUBCUTANEOUS | Status: AC
  Administered 2022-02-25: 5 [IU] via SUBCUTANEOUS
  Filled 2022-02-25: qty 0.05

## 2022-02-25 MED ORDER — INSULIN ASPART 100 UNIT/ML IJ SOLN
0.0000 [IU] | Freq: Every day | INTRAMUSCULAR | Status: DC
  Administered 2022-02-25: 3 [IU] via SUBCUTANEOUS

## 2022-02-25 MED ORDER — INSULIN ASPART 100 UNIT/ML IJ SOLN
0.0000 [IU] | Freq: Three times a day (TID) | INTRAMUSCULAR | Status: DC
  Administered 2022-02-25: 3 [IU] via SUBCUTANEOUS
  Administered 2022-02-25: 2 [IU] via SUBCUTANEOUS
  Administered 2022-02-26: 9 [IU] via SUBCUTANEOUS

## 2022-02-25 NOTE — NC FL2 (Signed)
[place] MEDICAID FL2 LEVEL OF CARE SCREENING TOOL     IDENTIFICATION  Patient Name: Carolyn Norton Birthdate: 07-07-1966 Sex: female Admission Date (Current Location): 02/24/2022  [facility] and [place] Number:  [employer] and Address:  [facility],  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 04/29/2021   Protein-calorie malnutrition, severe 03/23/2021   Controlled type 2 diabetes mellitus with hyperglycemia, with long-term current use of insulin (HCC) 02/11/2021   NPH (normal pressure hydrocephalus) (HCC)    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    Bipolar 1 disorder, depressed, full remission (HCC) 01/18/2021   Generalized weakness 01/11/2021   Acute metabolic encephalopathy 10/22/2020   Hypotension 10/22/2020   Type 2 diabetes mellitus with hyperlipidemia (HCC) 10/22/2020   Altered mental status    Hyperosmolar hyperglycemic state (HHS) (HCC)    Hyperglycemia    Hyperkalemia    AKI (acute kidney injury) (HCC)    Hypertension    DKA (diabetic ketoacidosis) (HCC) 09/06/2020   COVID-19 09/06/2020   Bipolar 1 disorder (HCC) 09/06/2020   Breast mass status post excision 09/06/2020   Smoker 09/06/2020   Metabolic acidosis due to ingestion of drugs or chemicals 09/06/2020    Orientation RESPIRATION BLADDER Height & Weight     Self, Place  Normal Incontinent Weight: 145 lb 11.6 oz (66.1  kg) Height:   (patient did not know)  BEHAVIORAL SYMPTOMS/MOOD NEUROLOGICAL BOWEL NUTRITION STATUS      Incontinent Diet (diabetic)  AMBULATORY STATUS COMMUNICATION OF NEEDS Skin   Extensive Assist Verbally Normal                       Personal Care Assistance Level of Assistance  Bathing, Feeding, Dressing Bathing Assistance: Limited assistance Feeding assistance: Independent Dressing Assistance: Limited assistance     Functional Limitations Info  Sight, Hearing, Speech Sight Info: Adequate Hearing Info: Adequate Speech Info: Adequate    SPECIAL CARE FACTORS FREQUENCY                       Contractures Contractures Info: Not present    Additional Factors Info  Code Status, Allergies, Psychotropic Code Status Info: Full Allergies Info: Erythromycin           Current Medications (02/25/2022):  This is the current hospital active medication list Current Facility-Administered Medications  Medication Dose Route Frequency Provider Last Rate Last Admin   Chlorhexidine Gluconate Cloth 2 % PADS 6 each  6 each Topical Daily Sherryll Burger, Pratik D, DO   6 each at 02/25/22 0900   dextrose 50 % solution 0-50 mL  0-50 mL Intravenous PRN Sherryll Burger, Pratik D, DO       divalproex (DEPAKOTE) DR tablet 250 mg  250 mg Oral BID Sherryll Burger, Pratik D, DO   250 mg at 02/25/22 0859   enoxaparin (LOVENOX) injection 40 mg  40 mg Subcutaneous Q24H Arrien, York Ram, MD  insulin aspart (novoLOG) injection 0-5 Units  0-5 Units Subcutaneous QHS Adefeso, Oladapo, DO       insulin aspart (novoLOG) injection 0-9 Units  0-9 Units Subcutaneous TID WC Adefeso, Oladapo, DO       [START ON 02/26/2022] insulin glargine-yfgn (SEMGLEE) injection 10 Units  10 Units Subcutaneous QHS Adefeso, Oladapo, DO       mupirocin ointment (BACTROBAN) 2 % 1 Application  1 Application Nasal BID Maurilio Lovely D, DO   1 Application at 02/25/22 0859   traZODone (DESYREL) tablet 50 mg  50 mg Oral QHS Sherryll Burger, Pratik D, DO          Discharge Medications: Please see discharge summary for a list of discharge medications.  Relevant Imaging Results:  Relevant Lab Results:   Additional Information    Elliot Gault, LCSW

## 2022-02-25 NOTE — Assessment & Plan Note (Signed)
No confusion or agitation.

## 2022-02-25 NOTE — Inpatient Diabetes Management (Signed)
Inpatient Diabetes Program Recommendations  AACE/ADA: New Consensus Statement on Inpatient Glycemic Control (2015)  Target Ranges:  Prepandial:   less than 140 mg/dL      Peak postprandial:   less than 180 mg/dL (1-2 hours)      Critically ill patients:  140 - 180 mg/dL   Lab Results  Component Value Date   GLUCAP 94 02/25/2022   HGBA1C 8.9 (H) 02/24/2022    Review of Glycemic Control  Latest Reference Range & Units 02/25/22 01:47 02/25/22 02:46 02/25/22 03:41 02/25/22 04:45 02/25/22 06:47  Glucose-Capillary 70 - 99 mg/dL 91 196 (H) 222 (H) 84 94  (H): Data is abnormally high Diabetes history: type 1 DM (requires basal/meal coverage) Outpatient Diabetes medications: Levemir 25 units QA/10 units QPM, Novolog 12 units TID, Metformin 500 mg BID Current orders for Inpatient glycemic control: IV insulin to transition to: Semglee 10 units QHS, Novolog 0-9 units TID, Novolog 0-5 units QHS Semglee 5 units x 1  Inpatient Diabetes Program Recommendations:    Noted IV insulin stopped prior to administration of Semglee, thus would anticipate blood sugars to increase.   Consider adding Novolog 3 units TID (assuming patient is consuming >50% of meals).   Thanks, Lujean Rave, MSN, RNC-OB Diabetes Coordinator [phone] (8a-5p)

## 2022-02-25 NOTE — Assessment & Plan Note (Signed)
Uncontrolled T2DM with Hgb A1c at 8,9.  Plan to continue insulin therapy, will resume metformin. Will need to continue with life style changes.   Continue with statin therapy.

## 2022-02-25 NOTE — Progress Notes (Signed)
  Diabetic ketoacidosis Transition to subcu insulin Serum glucose 100,  bicarb 27 She was started on insulin drip, IV LR with IV potassium per DKA protocol in the ED Anion gap already closed, currently at 7 Continue IV D5 LR and maintain blood glucose within 200-250 mg/dL Diet heart healthy/carb modified Subcu insulin 5 units x 1 will be given Continue IV D5 LR for about 2 hours after subcu insulin and check BMP prior to discontinuing IV drip Notify physician if patient is able to tolerate above without vomiting and patient will be started on basal insulin regimen and sliding scale If patient continues to have nausea/vomiting, patient may require further IV D5 half-normal saline and titrated insulin drip pending better control of nausea/vomiting.

## 2022-02-25 NOTE — Assessment & Plan Note (Signed)
Continue with trazodone and divalproex.

## 2022-02-25 NOTE — TOC Initial Note (Signed)
Transition of Care [facility]) - Initial/Assessment Note    Patient Details  Name: Carolyn Norton MRN: [id number] Date of Birth: October 14, 1965  [facility]) CM/SW Contact:    Elliot Gault, LCSW Phone Number: 02/25/2022, 11:33 AM  Clinical Narrative:                  Pt admitted from [facility] where she is in long term care. Spoke with Melissa at [facility] to update. MD anticipating pt will be medically stable for dc tomorrow. TOC will follow and assist with dc needs.  Expected Discharge Plan: Long Term Nursing Home Barriers to Discharge: Continued Medical Work up   Patient Goals and CMS Choice        Expected Discharge Plan and Services Expected Discharge Plan: Long Term Nursing Home In-house Referral: Clinical Social Work     Living arrangements for the past 2 months: Skilled Nursing Facility                                      Prior Living Arrangements/Services Living arrangements for the past 2 months: Skilled Nursing Facility   Patient language and need for interpreter reviewed:: Yes Do you feel safe going back to the place where you live?: Yes      Need for Family Participation in Patient Care: No (Comment) Care giver support system in place?: Yes (comment)   Criminal Activity/Legal Involvement Pertinent to Current Situation/Hospitalization: No - Comment as needed  Activities of Daily Living Home Assistive Devices/Equipment: None ADL Screening (condition at time of admission) Patient's cognitive ability adequate to safely complete daily activities?: No Is the patient deaf or have difficulty hearing?: No Does the patient have difficulty seeing, even when wearing glasses/contacts?: No Does the patient have difficulty concentrating, remembering, or making decisions?: No Patient able to express need for assistance with ADLs?: Yes Does the patient have difficulty dressing or bathing?: Yes Independently performs ADLs?:  No Communication: Independent Dressing (OT): Needs assistance Is this a change from baseline?: Pre-admission baseline Grooming: Needs assistance Is this a change from baseline?: Pre-admission baseline Feeding: Independent Bathing: Needs assistance Is this a change from baseline?: Pre-admission baseline Toileting: Needs assistance Is this a change from baseline?: Pre-admission baseline In/Out Bed: Needs assistance Is this a change from baseline?: Pre-admission baseline Walks in Home: Needs assistance Is this a change from baseline?: Pre-admission baseline Does the patient have difficulty walking or climbing stairs?: Yes Weakness of Legs: Both Weakness of Arms/Hands: Both  Permission Sought/Granted                  Emotional Assessment       Orientation: : Oriented to Self, Oriented to Place Alcohol / Substance Use: Not Applicable Psych Involvement: No (comment)  Admission diagnosis:  DKA (diabetic ketoacidosis) (HCC) [E11.10] AKI (acute kidney injury) (HCC) [N17.9] Diabetic ketoacidosis without coma associated with type 1 diabetes mellitus (HCC) [E10.10] Patient Active Problem List   Diagnosis Date Noted   Essential hypertension 02/25/2022   Elevated blood-pressure reading, without diagnosis of hypertension 07/29/2021   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 04/29/2021   Protein-calorie malnutrition, severe 03/23/2021   Controlled type 2 diabetes mellitus with hyperglycemia, with long-term current use of insulin (HCC) 02/11/2021   NPH (normal pressure hydrocephalus) (HCC)    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    Bipolar 1 disorder, depressed, full remission (HCC) 01/18/2021   Generalized  weakness 01/11/2021   Acute metabolic encephalopathy 10/22/2020   Hypotension 10/22/2020   Type 2 diabetes mellitus with hyperlipidemia (HCC) 10/22/2020   Altered mental status    Hyperosmolar hyperglycemic state (HHS) (HCC)    Hyperglycemia    Hyperkalemia     AKI (acute kidney injury) (HCC)    Hypertension    DKA (diabetic ketoacidosis) (HCC) 09/06/2020   COVID-19 09/06/2020   Bipolar 1 disorder (HCC) 09/06/2020   Breast mass status post excision 09/06/2020   Smoker 09/06/2020   Metabolic acidosis due to ingestion of drugs or chemicals 09/06/2020   PCP:  System, Provider Not In Pharmacy:   [facility] [address] [place] [address] Phone: [phone] Fax: [phone]     Social Determinants of Health (SDOH) Interventions    Readmission Risk Interventions    01/14/2021    9:18 AM  Readmission Risk Prevention Plan  Transportation Screening Complete  Social Work Consult for Recovery Care Planning/Counseling Complete  Palliative Care Screening Not Applicable  Medication Review Oceanographer) Referral to Pharmacy

## 2022-02-25 NOTE — Assessment & Plan Note (Addendum)
Metabolic encephalopathy, (acute) has resolved. Patient will return to SNF today.

## 2022-02-25 NOTE — Progress Notes (Signed)
Progress Note   Patient: Carolyn Norton IBB:[id number] DOB: 28-Jun-1966 DOA: 02/24/2022     0 DOS: the patient was seen and examined on 02/25/2022   Brief hospital course: Carolyn Norton was admitted to the hospital with the working diagnosis of diabetic ketoacidosis.   56 yo female with the past medical history of T2DM, hypertension, dyslipidemia, and bipolar who presented with hyperglycemia. Carolyn Norton was noted to have hyperglycemia and hypotension at the SNF where she is a resident. EMS was called and she was transported to the ED. On route she did receive 500 ml IV fluids. On her initial physical examination her blood pressure was 92/55, HR 96, RR 19 and 02 saturation 98%. Lungs with no wheezing or rhonchi, heart with S1 and S2 present and rhythmic, abdomen soft and not distended, no lower extremity edema.   Na 131, K 4,2 CL 96, bicarbonate 11, glucose 543, bun 28 cr 1,94 anion gap 24.  Wbc 20, hgb 13,4 plt 280   Chest radiograph with no infiltrates.   Patient was placed on IV fluids and IV insulin. Anion gap closed and she was successfully transitioned to SQ insulin with good toleration.   Assessment and Plan: * DKA (diabetic ketoacidosis) (HCC) Anion gap has closed and patient has been transitioned to SQ insulin.  She has no nausea or vomiting and is tolerating po well.  She can not recall missing insulin or having any recent change in her medications or diet that can explain hyperglycemia.   As outpatient is on long acting insulin 25 units in am and 10 units at bedtime.  12 units pre meal of short acting insulin.   Capillary glucose 94 this am.  Plan to continue insulin glargine 10 units and insulin scale for glucose cover and monitoring.  Will continue close glucose monitoring and resume her usual dose of insulin as tolerated.  Out of bed to chair tid with meals. Ok to transfer to medical ward, possible discharge tomorrow.    AKI (acute kidney injury) (HCC) Hypokalemia and  hypomagnesemia.   Renal function has improved, serum cr today is 0,88, K is 3,3, bicarbonate is 27, Mg 1,1 and anion gap is 7.   Discontinue IV fluids. Continue K correction with KCL and add 4 g mag sulfate.  Follow up renal function and electrolytes this pm.   Acute metabolic encephalopathy Metabolic encephalopathy, (acute) has resolved. Continue close neuro checks and out of bed.   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 No confusion or agitation.   Type 2 diabetes mellitus with hyperlipidemia (HCC) Uncontrolled T2DM with Hgb A1c at 8,9.  Plan to continue insulin therapy, will resume metformin. Will need to continue with life style changes.   Continue with statin therapy.    Essential hypertension Resume blood pressure agents with amlodipine and metoprolol.  Continue blood pressure monitoring. Ok to discontinue telemetry monitoring.   Bipolar 1 disorder (HCC) Continue with trazodone and divalproex.         Subjective: Patient is feeling better, no nausea or vomiting, no chest pain or dyspnea.   Physical Exam: Vitals:   02/25/22 0500 02/25/22 0700 02/25/22 0724 02/25/22 0800  BP:  133/62  (!) 118/53  Pulse:  81 97 87  Resp:  14 17 (!) 22  Temp: 98 F (36.7 C)  98 F (36.7 C)   TempSrc: Oral  Oral   SpO2:  98% 98% 97%  Weight: 66.1 kg     Height:       Neurology awake and  alert ENT with no pallor Cardiovascular with S1 and S2 present and rhythmic with no gallops, rubs or murmurs Respiratory with no rales or wheezing Abdomen not distended No lower extremity edema  Data Reviewed:    Family Communication: no family at the bedside   Disposition: Status is: Observation The patient remains OBS appropriate and will d/c before 2 midnights.  Planned Discharge Destination: Skilled nursing facility    Author: Coralie Keens, MD 02/25/2022 8:54 AM  For on call review [email or web address].

## 2022-02-25 NOTE — Progress Notes (Signed)
Pt transferred from bed to w/c to transfer to dept 300 room 337, has a very difficult time w/transferring & unable to walk at this time, could barely stand pivot

## 2022-02-25 NOTE — Progress Notes (Signed)
Patient transferred to unit today from ICU Dx DKA.  Blood sugars have been stable She is alert  She comes from [facility] facility and will return there at discharge.

## 2022-02-25 NOTE — Hospital Course (Addendum)
Mrs. Carolyn Norton was admitted to the hospital with the working diagnosis of diabetic ketoacidosis.   56 yo female with the past medical history of T2DM, hypertension, dyslipidemia, and bipolar who presented with hyperglycemia. [name] was noted to have hyperglycemia and hypotension at the SNF where she is a resident. EMS was called and she was transported to the ED. On route she did receive 500 ml IV fluids. On her initial physical examination her blood pressure was 92/55, HR 96, RR 19 and 02 saturation 98%. Lungs with no wheezing or rhonchi, heart with S1 and S2 present and rhythmic, abdomen soft and not distended, no lower extremity edema.   Na 131, K 4,2 CL 96, bicarbonate 11, glucose 543, bun 28 cr 1,94 anion gap 24.  Wbc 20, hgb 13,4 plt 280   Chest radiograph with no infiltrates.   Patient was placed on IV fluids and IV insulin. Anion gap closed and she was successfully transitioned to SQ insulin with good toleration.   Patient will continue insulin therapy as outpatient and will need a close follow up.

## 2022-02-25 NOTE — Assessment & Plan Note (Addendum)
Patient was admitted to the intensive care unit, she was placed on IV fluids and IV insulin with good toleration.   Her anion gap closed and she was transitioned to SQ insulin with good toleration.    Plan to continue with basal and pre meal insulin as outpatient. She will need a close follow up. Continue with metformin.

## 2022-02-25 NOTE — Assessment & Plan Note (Addendum)
Continue blood pressure control with amlodipine and metoprolol.  Follow up as outpatient.

## 2022-02-25 NOTE — Assessment & Plan Note (Addendum)
Hypokalemia and hypomagnesemia.  Pseudohyponatremia.  Patient was placed IV fluids and his electrolytes were corrected.  At the time of her discharge her serum cr is 0,80 with K at 4,6 and serum bicarbonate at 27.  Plan to follow up renal function as outpatient.

## 2022-02-26 DIAGNOSIS — Z79899 Other long term (current) drug therapy: Secondary | ICD-10-CM | POA: Diagnosis not present

## 2022-02-26 DIAGNOSIS — G9341 Metabolic encephalopathy: Secondary | ICD-10-CM | POA: Diagnosis not present

## 2022-02-26 DIAGNOSIS — E1169 Type 2 diabetes mellitus with other specified complication: Secondary | ICD-10-CM | POA: Diagnosis not present

## 2022-02-26 DIAGNOSIS — R400 Somnolence: Secondary | ICD-10-CM | POA: Diagnosis not present

## 2022-02-26 DIAGNOSIS — M25531 Pain in right wrist: Secondary | ICD-10-CM | POA: Diagnosis not present

## 2022-02-26 DIAGNOSIS — E43 Unspecified severe protein-calorie malnutrition: Secondary | ICD-10-CM | POA: Diagnosis not present

## 2022-02-26 DIAGNOSIS — G300 Alzheimer's disease with early onset: Secondary | ICD-10-CM | POA: Diagnosis not present

## 2022-02-26 DIAGNOSIS — E785 Hyperlipidemia, unspecified: Secondary | ICD-10-CM | POA: Diagnosis not present

## 2022-02-26 DIAGNOSIS — H8110 Benign paroxysmal vertigo, unspecified ear: Secondary | ICD-10-CM | POA: Diagnosis not present

## 2022-02-26 DIAGNOSIS — Z7401 Bed confinement status: Secondary | ICD-10-CM | POA: Diagnosis not present

## 2022-02-26 DIAGNOSIS — R4182 Altered mental status, unspecified: Secondary | ICD-10-CM | POA: Diagnosis not present

## 2022-02-26 DIAGNOSIS — E1111 Type 2 diabetes mellitus with ketoacidosis with coma: Secondary | ICD-10-CM | POA: Diagnosis not present

## 2022-02-26 DIAGNOSIS — I10 Essential (primary) hypertension: Secondary | ICD-10-CM | POA: Diagnosis not present

## 2022-02-26 DIAGNOSIS — R062 Wheezing: Secondary | ICD-10-CM | POA: Diagnosis not present

## 2022-02-26 DIAGNOSIS — G6289 Other specified polyneuropathies: Secondary | ICD-10-CM | POA: Diagnosis not present

## 2022-02-26 DIAGNOSIS — J45909 Unspecified asthma, uncomplicated: Secondary | ICD-10-CM | POA: Diagnosis not present

## 2022-02-26 DIAGNOSIS — I129 Hypertensive chronic kidney disease with stage 1 through stage 4 chronic kidney disease, or unspecified chronic kidney disease: Secondary | ICD-10-CM | POA: Diagnosis not present

## 2022-02-26 DIAGNOSIS — E1165 Type 2 diabetes mellitus with hyperglycemia: Secondary | ICD-10-CM | POA: Diagnosis not present

## 2022-02-26 DIAGNOSIS — G912 (Idiopathic) normal pressure hydrocephalus: Secondary | ICD-10-CM | POA: Diagnosis not present

## 2022-02-26 DIAGNOSIS — R600 Localized edema: Secondary | ICD-10-CM | POA: Diagnosis not present

## 2022-02-26 DIAGNOSIS — R5381 Other malaise: Secondary | ICD-10-CM | POA: Diagnosis not present

## 2022-02-26 DIAGNOSIS — E1110 Type 2 diabetes mellitus with ketoacidosis without coma: Secondary | ICD-10-CM | POA: Diagnosis not present

## 2022-02-26 DIAGNOSIS — N179 Acute kidney failure, unspecified: Secondary | ICD-10-CM | POA: Diagnosis not present

## 2022-02-26 DIAGNOSIS — F29 Unspecified psychosis not due to a substance or known physiological condition: Secondary | ICD-10-CM | POA: Diagnosis not present

## 2022-02-26 DIAGNOSIS — M6281 Muscle weakness (generalized): Secondary | ICD-10-CM | POA: Diagnosis not present

## 2022-02-26 DIAGNOSIS — R531 Weakness: Secondary | ICD-10-CM | POA: Diagnosis not present

## 2022-02-26 DIAGNOSIS — R0981 Nasal congestion: Secondary | ICD-10-CM | POA: Diagnosis not present

## 2022-02-26 DIAGNOSIS — R4189 Other symptoms and signs involving cognitive functions and awareness: Secondary | ICD-10-CM | POA: Diagnosis not present

## 2022-02-26 DIAGNOSIS — F3132 Bipolar disorder, current episode depressed, moderate: Secondary | ICD-10-CM | POA: Diagnosis not present

## 2022-02-26 DIAGNOSIS — F319 Bipolar disorder, unspecified: Secondary | ICD-10-CM | POA: Diagnosis not present

## 2022-02-26 DIAGNOSIS — R279 Unspecified lack of coordination: Secondary | ICD-10-CM | POA: Diagnosis not present

## 2022-02-26 DIAGNOSIS — G6281 Critical illness polyneuropathy: Secondary | ICD-10-CM | POA: Diagnosis not present

## 2022-02-26 LAB — GLUCOSE, CAPILLARY
Glucose-Capillary: 400 mg/dL — ABNORMAL HIGH (ref 70–99)
Glucose-Capillary: 226 mg/dL — ABNORMAL HIGH (ref 70–99)
Glucose-Capillary: 198 mg/dL — ABNORMAL HIGH (ref 70–99)

## 2022-02-26 LAB — BASIC METABOLIC PANEL
Anion gap: 6 (ref 5–15)
BUN: 12 mg/dL (ref 6–20)
CO2: 27 mmol/L (ref 22–32)
Calcium: 8.3 mg/dL — ABNORMAL LOW (ref 8.9–10.3)
Chloride: 100 mmol/L (ref 98–111)
Creatinine, Ser: 0.8 mg/dL (ref 0.44–1.00)
GFR, Estimated: >60 mL/min (ref >60)
Glucose, Bld: 406 mg/dL — ABNORMAL HIGH (ref 70–99)
Potassium: 4.6 mmol/L (ref 3.5–5.1)
Sodium: 133 mmol/L — ABNORMAL LOW (ref 135–145)

## 2022-02-26 MED ORDER — INSULIN GLARGINE-YFGN 100 UNIT/ML ~~LOC~~ SOLN: 10.0000 [IU] | Freq: Every day | SUBCUTANEOUS | Status: DC

## 2022-02-26 MED ORDER — INSULIN ASPART 100 UNIT/ML IJ SOLN: 0.0000 [IU] | Freq: Three times a day (TID) | INTRAMUSCULAR | Status: DC

## 2022-02-26 MED ORDER — INSULIN GLARGINE-YFGN 100 UNIT/ML ~~LOC~~ SOLN
25.0000 [IU] | Freq: Every morning | SUBCUTANEOUS | Status: DC
  Administered 2022-02-26: 25 [IU] via SUBCUTANEOUS
  Filled 2022-02-26 (×2): qty 0.25

## 2022-02-26 MED ORDER — INSULIN ASPART 100 UNIT/ML IJ SOLN
5.0000 [IU] | Freq: Once | INTRAMUSCULAR | Status: AC
  Administered 2022-02-26: 5 [IU] via SUBCUTANEOUS

## 2022-02-26 NOTE — TOC Transition Note (Signed)
Transition of Care [facility]) - CM/SW Discharge Note   Patient Details  Name: Carolyn Norton MRN: [id number] Date of Birth: 1966/01/18  Transition of Care [facility]) CM/SW Contact:  Elliot Gault, LCSW Phone Number: 02/26/2022, 10:29 AM   Clinical Narrative:     Pt medically stable for dc today per MD. Updated Melissa at [facility] and they are able to accept pt back today.  DC clinical sent electronically. RN to call report. EMS arranged.   Updated pt's father. No other TOC needs for dc.  Final next level of care: Long Term Nursing Home Barriers to Discharge: Barriers Resolved   Patient Goals and CMS Choice        Discharge Placement                       Discharge Plan and Services In-house Referral: Clinical Social Work                                   Social Determinants of Health (SDOH) Interventions     Readmission Risk Interventions    01/14/2021    9:18 AM  Readmission Risk Prevention Plan  Transportation Screening Complete  Social Work Consult for Recovery Care Planning/Counseling Complete  Palliative Care Screening Not Applicable  Medication Review Oceanographer) Referral to Pharmacy

## 2022-02-26 NOTE — Progress Notes (Signed)
Report given to [facility] at [facility], no further questions at this time, waiting on arrival of EMS.

## 2022-03-01 LAB — CULTURE, BLOOD (ROUTINE X 2)
Culture: NO GROWTH
Culture: NO GROWTH
Special Requests: ADEQUATE

## 2022-03-02 DIAGNOSIS — E11649 Type 2 diabetes mellitus with hypoglycemia without coma: Secondary | ICD-10-CM | POA: Diagnosis not present

## 2022-03-02 DIAGNOSIS — Z79899 Other long term (current) drug therapy: Secondary | ICD-10-CM | POA: Diagnosis not present

## 2022-03-04 DIAGNOSIS — R062 Wheezing: Secondary | ICD-10-CM | POA: Diagnosis not present

## 2022-03-04 DIAGNOSIS — E43 Unspecified severe protein-calorie malnutrition: Secondary | ICD-10-CM | POA: Diagnosis not present

## 2022-03-04 DIAGNOSIS — G6281 Critical illness polyneuropathy: Secondary | ICD-10-CM | POA: Diagnosis not present

## 2022-03-04 DIAGNOSIS — F3132 Bipolar disorder, current episode depressed, moderate: Secondary | ICD-10-CM | POA: Diagnosis not present

## 2022-03-04 DIAGNOSIS — R531 Weakness: Secondary | ICD-10-CM | POA: Diagnosis not present

## 2022-03-04 DIAGNOSIS — G912 (Idiopathic) normal pressure hydrocephalus: Secondary | ICD-10-CM | POA: Diagnosis not present

## 2022-03-04 DIAGNOSIS — F319 Bipolar disorder, unspecified: Secondary | ICD-10-CM | POA: Diagnosis not present

## 2022-03-04 DIAGNOSIS — G300 Alzheimer's disease with early onset: Secondary | ICD-10-CM | POA: Diagnosis not present

## 2022-03-04 DIAGNOSIS — Z794 Long term (current) use of insulin: Secondary | ICD-10-CM | POA: Diagnosis not present

## 2022-03-04 DIAGNOSIS — F29 Unspecified psychosis not due to a substance or known physiological condition: Secondary | ICD-10-CM | POA: Diagnosis not present

## 2022-03-04 DIAGNOSIS — R600 Localized edema: Secondary | ICD-10-CM | POA: Diagnosis not present

## 2022-03-04 DIAGNOSIS — J45909 Unspecified asthma, uncomplicated: Secondary | ICD-10-CM | POA: Diagnosis not present

## 2022-03-04 DIAGNOSIS — E1169 Type 2 diabetes mellitus with other specified complication: Secondary | ICD-10-CM | POA: Diagnosis not present

## 2022-03-04 DIAGNOSIS — M25531 Pain in right wrist: Secondary | ICD-10-CM | POA: Diagnosis not present

## 2022-03-04 DIAGNOSIS — H8110 Benign paroxysmal vertigo, unspecified ear: Secondary | ICD-10-CM | POA: Diagnosis not present

## 2022-03-04 DIAGNOSIS — R4182 Altered mental status, unspecified: Secondary | ICD-10-CM | POA: Diagnosis not present

## 2022-03-04 DIAGNOSIS — E1110 Type 2 diabetes mellitus with ketoacidosis without coma: Secondary | ICD-10-CM | POA: Diagnosis not present

## 2022-03-04 DIAGNOSIS — R0981 Nasal congestion: Secondary | ICD-10-CM | POA: Diagnosis not present

## 2022-03-04 DIAGNOSIS — I10 Essential (primary) hypertension: Secondary | ICD-10-CM | POA: Diagnosis not present

## 2022-03-04 DIAGNOSIS — I129 Hypertensive chronic kidney disease with stage 1 through stage 4 chronic kidney disease, or unspecified chronic kidney disease: Secondary | ICD-10-CM | POA: Diagnosis not present

## 2022-03-04 DIAGNOSIS — G6289 Other specified polyneuropathies: Secondary | ICD-10-CM | POA: Diagnosis not present

## 2022-03-04 DIAGNOSIS — E785 Hyperlipidemia, unspecified: Secondary | ICD-10-CM | POA: Diagnosis not present

## 2022-03-04 DIAGNOSIS — N179 Acute kidney failure, unspecified: Secondary | ICD-10-CM | POA: Diagnosis not present

## 2022-03-04 DIAGNOSIS — R400 Somnolence: Secondary | ICD-10-CM | POA: Diagnosis not present

## 2022-03-04 DIAGNOSIS — M6281 Muscle weakness (generalized): Secondary | ICD-10-CM | POA: Diagnosis not present

## 2022-03-04 DIAGNOSIS — E1165 Type 2 diabetes mellitus with hyperglycemia: Secondary | ICD-10-CM | POA: Diagnosis not present

## 2022-03-07 DIAGNOSIS — R400 Somnolence: Secondary | ICD-10-CM | POA: Diagnosis not present

## 2022-03-07 DIAGNOSIS — G912 (Idiopathic) normal pressure hydrocephalus: Secondary | ICD-10-CM | POA: Diagnosis not present

## 2022-03-07 DIAGNOSIS — G300 Alzheimer's disease with early onset: Secondary | ICD-10-CM | POA: Diagnosis not present

## 2022-03-07 DIAGNOSIS — E1110 Type 2 diabetes mellitus with ketoacidosis without coma: Secondary | ICD-10-CM | POA: Diagnosis not present

## 2022-03-07 DIAGNOSIS — J45909 Unspecified asthma, uncomplicated: Secondary | ICD-10-CM | POA: Diagnosis not present

## 2022-03-07 DIAGNOSIS — G6281 Critical illness polyneuropathy: Secondary | ICD-10-CM | POA: Diagnosis not present

## 2022-03-07 DIAGNOSIS — R062 Wheezing: Secondary | ICD-10-CM | POA: Diagnosis not present

## 2022-03-07 DIAGNOSIS — N179 Acute kidney failure, unspecified: Secondary | ICD-10-CM | POA: Diagnosis not present

## 2022-03-07 DIAGNOSIS — E11649 Type 2 diabetes mellitus with hypoglycemia without coma: Secondary | ICD-10-CM | POA: Diagnosis not present

## 2022-03-07 DIAGNOSIS — M25531 Pain in right wrist: Secondary | ICD-10-CM | POA: Diagnosis not present

## 2022-03-07 DIAGNOSIS — G6289 Other specified polyneuropathies: Secondary | ICD-10-CM | POA: Diagnosis not present

## 2022-03-07 DIAGNOSIS — R531 Weakness: Secondary | ICD-10-CM | POA: Diagnosis not present

## 2022-03-07 DIAGNOSIS — E1165 Type 2 diabetes mellitus with hyperglycemia: Secondary | ICD-10-CM | POA: Diagnosis not present

## 2022-03-07 DIAGNOSIS — F3132 Bipolar disorder, current episode depressed, moderate: Secondary | ICD-10-CM | POA: Diagnosis not present

## 2022-03-07 DIAGNOSIS — I129 Hypertensive chronic kidney disease with stage 1 through stage 4 chronic kidney disease, or unspecified chronic kidney disease: Secondary | ICD-10-CM | POA: Diagnosis not present

## 2022-03-07 DIAGNOSIS — H8110 Benign paroxysmal vertigo, unspecified ear: Secondary | ICD-10-CM | POA: Diagnosis not present

## 2022-03-07 DIAGNOSIS — R0981 Nasal congestion: Secondary | ICD-10-CM | POA: Diagnosis not present

## 2022-03-07 DIAGNOSIS — E1169 Type 2 diabetes mellitus with other specified complication: Secondary | ICD-10-CM | POA: Diagnosis not present

## 2022-03-07 DIAGNOSIS — R600 Localized edema: Secondary | ICD-10-CM | POA: Diagnosis not present

## 2022-03-07 DIAGNOSIS — E785 Hyperlipidemia, unspecified: Secondary | ICD-10-CM | POA: Diagnosis not present

## 2022-03-07 DIAGNOSIS — M6281 Muscle weakness (generalized): Secondary | ICD-10-CM | POA: Diagnosis not present

## 2022-03-07 DIAGNOSIS — F29 Unspecified psychosis not due to a substance or known physiological condition: Secondary | ICD-10-CM | POA: Diagnosis not present

## 2022-03-07 DIAGNOSIS — F319 Bipolar disorder, unspecified: Secondary | ICD-10-CM | POA: Diagnosis not present

## 2022-03-07 DIAGNOSIS — I10 Essential (primary) hypertension: Secondary | ICD-10-CM | POA: Diagnosis not present

## 2022-03-07 DIAGNOSIS — R4182 Altered mental status, unspecified: Secondary | ICD-10-CM | POA: Diagnosis not present

## 2022-03-12 DIAGNOSIS — E11649 Type 2 diabetes mellitus with hypoglycemia without coma: Secondary | ICD-10-CM | POA: Diagnosis not present

## 2022-03-12 DIAGNOSIS — Z79899 Other long term (current) drug therapy: Secondary | ICD-10-CM | POA: Diagnosis not present

## 2022-03-12 DIAGNOSIS — Z794 Long term (current) use of insulin: Secondary | ICD-10-CM | POA: Diagnosis not present

## 2022-03-18 DIAGNOSIS — I10 Essential (primary) hypertension: Secondary | ICD-10-CM | POA: Diagnosis not present

## 2022-03-18 DIAGNOSIS — N179 Acute kidney failure, unspecified: Secondary | ICD-10-CM | POA: Diagnosis not present

## 2022-03-18 DIAGNOSIS — R531 Weakness: Secondary | ICD-10-CM | POA: Diagnosis not present

## 2022-03-18 DIAGNOSIS — R400 Somnolence: Secondary | ICD-10-CM | POA: Diagnosis not present

## 2022-03-18 DIAGNOSIS — R600 Localized edema: Secondary | ICD-10-CM | POA: Diagnosis not present

## 2022-03-18 DIAGNOSIS — F3132 Bipolar disorder, current episode depressed, moderate: Secondary | ICD-10-CM | POA: Diagnosis not present

## 2022-03-18 DIAGNOSIS — R4182 Altered mental status, unspecified: Secondary | ICD-10-CM | POA: Diagnosis not present

## 2022-03-18 DIAGNOSIS — F29 Unspecified psychosis not due to a substance or known physiological condition: Secondary | ICD-10-CM | POA: Diagnosis not present

## 2022-03-18 DIAGNOSIS — E785 Hyperlipidemia, unspecified: Secondary | ICD-10-CM | POA: Diagnosis not present

## 2022-03-18 DIAGNOSIS — J45909 Unspecified asthma, uncomplicated: Secondary | ICD-10-CM | POA: Diagnosis not present

## 2022-03-18 DIAGNOSIS — I129 Hypertensive chronic kidney disease with stage 1 through stage 4 chronic kidney disease, or unspecified chronic kidney disease: Secondary | ICD-10-CM | POA: Diagnosis not present

## 2022-03-18 DIAGNOSIS — G6289 Other specified polyneuropathies: Secondary | ICD-10-CM | POA: Diagnosis not present

## 2022-03-18 DIAGNOSIS — R0981 Nasal congestion: Secondary | ICD-10-CM | POA: Diagnosis not present

## 2022-03-18 DIAGNOSIS — E11649 Type 2 diabetes mellitus with hypoglycemia without coma: Secondary | ICD-10-CM | POA: Diagnosis not present

## 2022-03-18 DIAGNOSIS — G912 (Idiopathic) normal pressure hydrocephalus: Secondary | ICD-10-CM | POA: Diagnosis not present

## 2022-03-18 DIAGNOSIS — E1110 Type 2 diabetes mellitus with ketoacidosis without coma: Secondary | ICD-10-CM | POA: Diagnosis not present

## 2022-03-18 DIAGNOSIS — H8110 Benign paroxysmal vertigo, unspecified ear: Secondary | ICD-10-CM | POA: Diagnosis not present

## 2022-03-18 DIAGNOSIS — M6281 Muscle weakness (generalized): Secondary | ICD-10-CM | POA: Diagnosis not present

## 2022-03-18 DIAGNOSIS — G6281 Critical illness polyneuropathy: Secondary | ICD-10-CM | POA: Diagnosis not present

## 2022-03-18 DIAGNOSIS — E1169 Type 2 diabetes mellitus with other specified complication: Secondary | ICD-10-CM | POA: Diagnosis not present

## 2022-03-18 DIAGNOSIS — E1165 Type 2 diabetes mellitus with hyperglycemia: Secondary | ICD-10-CM | POA: Diagnosis not present

## 2022-03-18 DIAGNOSIS — M25531 Pain in right wrist: Secondary | ICD-10-CM | POA: Diagnosis not present

## 2022-03-18 DIAGNOSIS — G300 Alzheimer's disease with early onset: Secondary | ICD-10-CM | POA: Diagnosis not present

## 2022-03-18 DIAGNOSIS — R062 Wheezing: Secondary | ICD-10-CM | POA: Diagnosis not present

## 2022-03-18 DIAGNOSIS — F319 Bipolar disorder, unspecified: Secondary | ICD-10-CM | POA: Diagnosis not present

## 2022-03-19 DIAGNOSIS — Z794 Long term (current) use of insulin: Secondary | ICD-10-CM | POA: Diagnosis not present

## 2022-03-19 DIAGNOSIS — E1165 Type 2 diabetes mellitus with hyperglycemia: Secondary | ICD-10-CM | POA: Diagnosis not present

## 2022-03-19 DIAGNOSIS — Z79899 Other long term (current) drug therapy: Secondary | ICD-10-CM | POA: Diagnosis not present

## 2022-03-25 DIAGNOSIS — M25531 Pain in right wrist: Secondary | ICD-10-CM | POA: Diagnosis not present

## 2022-03-25 DIAGNOSIS — F319 Bipolar disorder, unspecified: Secondary | ICD-10-CM | POA: Diagnosis not present

## 2022-03-25 DIAGNOSIS — E11649 Type 2 diabetes mellitus with hypoglycemia without coma: Secondary | ICD-10-CM | POA: Diagnosis not present

## 2022-03-25 DIAGNOSIS — F3132 Bipolar disorder, current episode depressed, moderate: Secondary | ICD-10-CM | POA: Diagnosis not present

## 2022-03-25 DIAGNOSIS — E785 Hyperlipidemia, unspecified: Secondary | ICD-10-CM | POA: Diagnosis not present

## 2022-03-25 DIAGNOSIS — G6289 Other specified polyneuropathies: Secondary | ICD-10-CM | POA: Diagnosis not present

## 2022-03-25 DIAGNOSIS — R062 Wheezing: Secondary | ICD-10-CM | POA: Diagnosis not present

## 2022-03-25 DIAGNOSIS — R600 Localized edema: Secondary | ICD-10-CM | POA: Diagnosis not present

## 2022-03-25 DIAGNOSIS — N179 Acute kidney failure, unspecified: Secondary | ICD-10-CM | POA: Diagnosis not present

## 2022-03-25 DIAGNOSIS — R400 Somnolence: Secondary | ICD-10-CM | POA: Diagnosis not present

## 2022-03-25 DIAGNOSIS — E1110 Type 2 diabetes mellitus with ketoacidosis without coma: Secondary | ICD-10-CM | POA: Diagnosis not present

## 2022-03-25 DIAGNOSIS — I10 Essential (primary) hypertension: Secondary | ICD-10-CM | POA: Diagnosis not present

## 2022-03-25 DIAGNOSIS — R4182 Altered mental status, unspecified: Secondary | ICD-10-CM | POA: Diagnosis not present

## 2022-03-25 DIAGNOSIS — H8110 Benign paroxysmal vertigo, unspecified ear: Secondary | ICD-10-CM | POA: Diagnosis not present

## 2022-03-25 DIAGNOSIS — R531 Weakness: Secondary | ICD-10-CM | POA: Diagnosis not present

## 2022-03-25 DIAGNOSIS — G6281 Critical illness polyneuropathy: Secondary | ICD-10-CM | POA: Diagnosis not present

## 2022-03-25 DIAGNOSIS — G300 Alzheimer's disease with early onset: Secondary | ICD-10-CM | POA: Diagnosis not present

## 2022-03-25 DIAGNOSIS — J45909 Unspecified asthma, uncomplicated: Secondary | ICD-10-CM | POA: Diagnosis not present

## 2022-03-25 DIAGNOSIS — I129 Hypertensive chronic kidney disease with stage 1 through stage 4 chronic kidney disease, or unspecified chronic kidney disease: Secondary | ICD-10-CM | POA: Diagnosis not present

## 2022-03-25 DIAGNOSIS — M6281 Muscle weakness (generalized): Secondary | ICD-10-CM | POA: Diagnosis not present

## 2022-03-25 DIAGNOSIS — E1165 Type 2 diabetes mellitus with hyperglycemia: Secondary | ICD-10-CM | POA: Diagnosis not present

## 2022-03-25 DIAGNOSIS — F29 Unspecified psychosis not due to a substance or known physiological condition: Secondary | ICD-10-CM | POA: Diagnosis not present

## 2022-03-25 DIAGNOSIS — G912 (Idiopathic) normal pressure hydrocephalus: Secondary | ICD-10-CM | POA: Diagnosis not present

## 2022-03-25 DIAGNOSIS — E1169 Type 2 diabetes mellitus with other specified complication: Secondary | ICD-10-CM | POA: Diagnosis not present

## 2022-03-25 DIAGNOSIS — R0981 Nasal congestion: Secondary | ICD-10-CM | POA: Diagnosis not present

## 2022-03-26 DIAGNOSIS — R627 Adult failure to thrive: Secondary | ICD-10-CM | POA: Diagnosis not present

## 2022-03-26 DIAGNOSIS — Z79899 Other long term (current) drug therapy: Secondary | ICD-10-CM | POA: Diagnosis not present

## 2022-03-26 DIAGNOSIS — R630 Anorexia: Secondary | ICD-10-CM | POA: Diagnosis not present

## 2022-03-31 ENCOUNTER — Other Ambulatory Visit: Payer: Self-pay | Admitting: Neurosurgery

## 2022-03-31 DIAGNOSIS — G912 (Idiopathic) normal pressure hydrocephalus: Secondary | ICD-10-CM

## 2022-04-01 DIAGNOSIS — R4182 Altered mental status, unspecified: Secondary | ICD-10-CM | POA: Diagnosis not present

## 2022-04-01 DIAGNOSIS — I129 Hypertensive chronic kidney disease with stage 1 through stage 4 chronic kidney disease, or unspecified chronic kidney disease: Secondary | ICD-10-CM | POA: Diagnosis not present

## 2022-04-01 DIAGNOSIS — R062 Wheezing: Secondary | ICD-10-CM | POA: Diagnosis not present

## 2022-04-01 DIAGNOSIS — E1165 Type 2 diabetes mellitus with hyperglycemia: Secondary | ICD-10-CM | POA: Diagnosis not present

## 2022-04-01 DIAGNOSIS — M25531 Pain in right wrist: Secondary | ICD-10-CM | POA: Diagnosis not present

## 2022-04-01 DIAGNOSIS — N179 Acute kidney failure, unspecified: Secondary | ICD-10-CM | POA: Diagnosis not present

## 2022-04-01 DIAGNOSIS — R600 Localized edema: Secondary | ICD-10-CM | POA: Diagnosis not present

## 2022-04-01 DIAGNOSIS — E11649 Type 2 diabetes mellitus with hypoglycemia without coma: Secondary | ICD-10-CM | POA: Diagnosis not present

## 2022-04-01 DIAGNOSIS — F319 Bipolar disorder, unspecified: Secondary | ICD-10-CM | POA: Diagnosis not present

## 2022-04-01 DIAGNOSIS — R531 Weakness: Secondary | ICD-10-CM | POA: Diagnosis not present

## 2022-04-01 DIAGNOSIS — R0981 Nasal congestion: Secondary | ICD-10-CM | POA: Diagnosis not present

## 2022-04-01 DIAGNOSIS — G300 Alzheimer's disease with early onset: Secondary | ICD-10-CM | POA: Diagnosis not present

## 2022-04-01 DIAGNOSIS — E1110 Type 2 diabetes mellitus with ketoacidosis without coma: Secondary | ICD-10-CM | POA: Diagnosis not present

## 2022-04-01 DIAGNOSIS — F3132 Bipolar disorder, current episode depressed, moderate: Secondary | ICD-10-CM | POA: Diagnosis not present

## 2022-04-01 DIAGNOSIS — F29 Unspecified psychosis not due to a substance or known physiological condition: Secondary | ICD-10-CM | POA: Diagnosis not present

## 2022-04-01 DIAGNOSIS — H8110 Benign paroxysmal vertigo, unspecified ear: Secondary | ICD-10-CM | POA: Diagnosis not present

## 2022-04-01 DIAGNOSIS — M6281 Muscle weakness (generalized): Secondary | ICD-10-CM | POA: Diagnosis not present

## 2022-04-01 DIAGNOSIS — G912 (Idiopathic) normal pressure hydrocephalus: Secondary | ICD-10-CM | POA: Diagnosis not present

## 2022-04-01 DIAGNOSIS — E785 Hyperlipidemia, unspecified: Secondary | ICD-10-CM | POA: Diagnosis not present

## 2022-04-01 DIAGNOSIS — G6281 Critical illness polyneuropathy: Secondary | ICD-10-CM | POA: Diagnosis not present

## 2022-04-01 DIAGNOSIS — E1169 Type 2 diabetes mellitus with other specified complication: Secondary | ICD-10-CM | POA: Diagnosis not present

## 2022-04-01 DIAGNOSIS — R400 Somnolence: Secondary | ICD-10-CM | POA: Diagnosis not present

## 2022-04-01 DIAGNOSIS — I10 Essential (primary) hypertension: Secondary | ICD-10-CM | POA: Diagnosis not present

## 2022-04-01 DIAGNOSIS — J45909 Unspecified asthma, uncomplicated: Secondary | ICD-10-CM | POA: Diagnosis not present

## 2022-04-01 DIAGNOSIS — G6289 Other specified polyneuropathies: Secondary | ICD-10-CM | POA: Diagnosis not present

## 2022-04-02 ENCOUNTER — Other Ambulatory Visit (HOSPITAL_COMMUNITY): Payer: Self-pay | Admitting: Neurosurgery

## 2022-04-02 ENCOUNTER — Other Ambulatory Visit: Payer: Self-pay | Admitting: Neurosurgery

## 2022-04-02 DIAGNOSIS — G912 (Idiopathic) normal pressure hydrocephalus: Secondary | ICD-10-CM

## 2022-04-07 DIAGNOSIS — E782 Mixed hyperlipidemia: Secondary | ICD-10-CM | POA: Diagnosis not present

## 2022-04-07 DIAGNOSIS — G6281 Critical illness polyneuropathy: Secondary | ICD-10-CM | POA: Diagnosis not present

## 2022-04-07 DIAGNOSIS — R400 Somnolence: Secondary | ICD-10-CM | POA: Diagnosis not present

## 2022-04-07 DIAGNOSIS — F29 Unspecified psychosis not due to a substance or known physiological condition: Secondary | ICD-10-CM | POA: Diagnosis not present

## 2022-04-07 DIAGNOSIS — E785 Hyperlipidemia, unspecified: Secondary | ICD-10-CM | POA: Diagnosis not present

## 2022-04-07 DIAGNOSIS — R531 Weakness: Secondary | ICD-10-CM | POA: Diagnosis not present

## 2022-04-07 DIAGNOSIS — E1169 Type 2 diabetes mellitus with other specified complication: Secondary | ICD-10-CM | POA: Diagnosis not present

## 2022-04-07 DIAGNOSIS — G300 Alzheimer's disease with early onset: Secondary | ICD-10-CM | POA: Diagnosis not present

## 2022-04-07 DIAGNOSIS — G912 (Idiopathic) normal pressure hydrocephalus: Secondary | ICD-10-CM | POA: Diagnosis not present

## 2022-04-07 DIAGNOSIS — G6289 Other specified polyneuropathies: Secondary | ICD-10-CM | POA: Diagnosis not present

## 2022-04-07 DIAGNOSIS — E11649 Type 2 diabetes mellitus with hypoglycemia without coma: Secondary | ICD-10-CM | POA: Diagnosis not present

## 2022-04-07 DIAGNOSIS — N179 Acute kidney failure, unspecified: Secondary | ICD-10-CM | POA: Diagnosis not present

## 2022-04-07 DIAGNOSIS — I10 Essential (primary) hypertension: Secondary | ICD-10-CM | POA: Diagnosis not present

## 2022-04-07 DIAGNOSIS — H8110 Benign paroxysmal vertigo, unspecified ear: Secondary | ICD-10-CM | POA: Diagnosis not present

## 2022-04-07 DIAGNOSIS — R062 Wheezing: Secondary | ICD-10-CM | POA: Diagnosis not present

## 2022-04-07 DIAGNOSIS — R600 Localized edema: Secondary | ICD-10-CM | POA: Diagnosis not present

## 2022-04-07 DIAGNOSIS — J45909 Unspecified asthma, uncomplicated: Secondary | ICD-10-CM | POA: Diagnosis not present

## 2022-04-07 DIAGNOSIS — F319 Bipolar disorder, unspecified: Secondary | ICD-10-CM | POA: Diagnosis not present

## 2022-04-07 DIAGNOSIS — F3132 Bipolar disorder, current episode depressed, moderate: Secondary | ICD-10-CM | POA: Diagnosis not present

## 2022-04-07 DIAGNOSIS — E1165 Type 2 diabetes mellitus with hyperglycemia: Secondary | ICD-10-CM | POA: Diagnosis not present

## 2022-04-07 DIAGNOSIS — E1110 Type 2 diabetes mellitus with ketoacidosis without coma: Secondary | ICD-10-CM | POA: Diagnosis not present

## 2022-04-07 DIAGNOSIS — I129 Hypertensive chronic kidney disease with stage 1 through stage 4 chronic kidney disease, or unspecified chronic kidney disease: Secondary | ICD-10-CM | POA: Diagnosis not present

## 2022-04-07 DIAGNOSIS — R0981 Nasal congestion: Secondary | ICD-10-CM | POA: Diagnosis not present

## 2022-04-07 DIAGNOSIS — M25531 Pain in right wrist: Secondary | ICD-10-CM | POA: Diagnosis not present

## 2022-04-07 DIAGNOSIS — M6281 Muscle weakness (generalized): Secondary | ICD-10-CM | POA: Diagnosis not present

## 2022-04-07 DIAGNOSIS — R4182 Altered mental status, unspecified: Secondary | ICD-10-CM | POA: Diagnosis not present

## 2022-04-15 DIAGNOSIS — G6281 Critical illness polyneuropathy: Secondary | ICD-10-CM | POA: Diagnosis not present

## 2022-04-15 DIAGNOSIS — R600 Localized edema: Secondary | ICD-10-CM | POA: Diagnosis not present

## 2022-04-15 DIAGNOSIS — H8110 Benign paroxysmal vertigo, unspecified ear: Secondary | ICD-10-CM | POA: Diagnosis not present

## 2022-04-15 DIAGNOSIS — R531 Weakness: Secondary | ICD-10-CM | POA: Diagnosis not present

## 2022-04-15 DIAGNOSIS — F3132 Bipolar disorder, current episode depressed, moderate: Secondary | ICD-10-CM | POA: Diagnosis not present

## 2022-04-15 DIAGNOSIS — R400 Somnolence: Secondary | ICD-10-CM | POA: Diagnosis not present

## 2022-04-15 DIAGNOSIS — F29 Unspecified psychosis not due to a substance or known physiological condition: Secondary | ICD-10-CM | POA: Diagnosis not present

## 2022-04-15 DIAGNOSIS — M25531 Pain in right wrist: Secondary | ICD-10-CM | POA: Diagnosis not present

## 2022-04-15 DIAGNOSIS — I129 Hypertensive chronic kidney disease with stage 1 through stage 4 chronic kidney disease, or unspecified chronic kidney disease: Secondary | ICD-10-CM | POA: Diagnosis not present

## 2022-04-15 DIAGNOSIS — R4182 Altered mental status, unspecified: Secondary | ICD-10-CM | POA: Diagnosis not present

## 2022-04-15 DIAGNOSIS — G300 Alzheimer's disease with early onset: Secondary | ICD-10-CM | POA: Diagnosis not present

## 2022-04-15 DIAGNOSIS — M6281 Muscle weakness (generalized): Secondary | ICD-10-CM | POA: Diagnosis not present

## 2022-04-15 DIAGNOSIS — E1165 Type 2 diabetes mellitus with hyperglycemia: Secondary | ICD-10-CM | POA: Diagnosis not present

## 2022-04-15 DIAGNOSIS — E1110 Type 2 diabetes mellitus with ketoacidosis without coma: Secondary | ICD-10-CM | POA: Diagnosis not present

## 2022-04-15 DIAGNOSIS — R0981 Nasal congestion: Secondary | ICD-10-CM | POA: Diagnosis not present

## 2022-04-15 DIAGNOSIS — E11649 Type 2 diabetes mellitus with hypoglycemia without coma: Secondary | ICD-10-CM | POA: Diagnosis not present

## 2022-04-15 DIAGNOSIS — E1169 Type 2 diabetes mellitus with other specified complication: Secondary | ICD-10-CM | POA: Diagnosis not present

## 2022-04-15 DIAGNOSIS — G6289 Other specified polyneuropathies: Secondary | ICD-10-CM | POA: Diagnosis not present

## 2022-04-15 DIAGNOSIS — F319 Bipolar disorder, unspecified: Secondary | ICD-10-CM | POA: Diagnosis not present

## 2022-04-15 DIAGNOSIS — I10 Essential (primary) hypertension: Secondary | ICD-10-CM | POA: Diagnosis not present

## 2022-04-15 DIAGNOSIS — J45909 Unspecified asthma, uncomplicated: Secondary | ICD-10-CM | POA: Diagnosis not present

## 2022-04-15 DIAGNOSIS — R062 Wheezing: Secondary | ICD-10-CM | POA: Diagnosis not present

## 2022-04-15 DIAGNOSIS — N179 Acute kidney failure, unspecified: Secondary | ICD-10-CM | POA: Diagnosis not present

## 2022-04-15 DIAGNOSIS — E785 Hyperlipidemia, unspecified: Secondary | ICD-10-CM | POA: Diagnosis not present

## 2022-04-15 DIAGNOSIS — G912 (Idiopathic) normal pressure hydrocephalus: Secondary | ICD-10-CM | POA: Diagnosis not present

## 2022-04-20 DIAGNOSIS — R630 Anorexia: Secondary | ICD-10-CM | POA: Diagnosis not present

## 2022-04-20 DIAGNOSIS — Z79899 Other long term (current) drug therapy: Secondary | ICD-10-CM | POA: Diagnosis not present

## 2022-04-20 DIAGNOSIS — E1165 Type 2 diabetes mellitus with hyperglycemia: Secondary | ICD-10-CM | POA: Diagnosis not present

## 2022-04-22 ENCOUNTER — Ambulatory Visit (HOSPITAL_COMMUNITY)
Admission: RE | Admit: 2022-04-22 | Discharge: 2022-04-22 | Disposition: A | Discharge status: Home / Self Care | Payer: Medicaid Other | Source: Ambulatory Visit | Attending: Neurosurgery | Admitting: Neurosurgery

## 2022-04-22 DIAGNOSIS — R531 Weakness: Secondary | ICD-10-CM | POA: Diagnosis not present

## 2022-04-22 DIAGNOSIS — I10 Essential (primary) hypertension: Secondary | ICD-10-CM | POA: Diagnosis not present

## 2022-04-22 DIAGNOSIS — N179 Acute kidney failure, unspecified: Secondary | ICD-10-CM | POA: Diagnosis not present

## 2022-04-22 DIAGNOSIS — F29 Unspecified psychosis not due to a substance or known physiological condition: Secondary | ICD-10-CM | POA: Diagnosis not present

## 2022-04-22 DIAGNOSIS — G912 (Idiopathic) normal pressure hydrocephalus: Secondary | ICD-10-CM | POA: Insufficient documentation

## 2022-04-22 DIAGNOSIS — R0981 Nasal congestion: Secondary | ICD-10-CM | POA: Diagnosis not present

## 2022-04-22 DIAGNOSIS — M25531 Pain in right wrist: Secondary | ICD-10-CM | POA: Diagnosis not present

## 2022-04-22 DIAGNOSIS — F3132 Bipolar disorder, current episode depressed, moderate: Secondary | ICD-10-CM | POA: Diagnosis not present

## 2022-04-22 DIAGNOSIS — F319 Bipolar disorder, unspecified: Secondary | ICD-10-CM | POA: Diagnosis not present

## 2022-04-22 DIAGNOSIS — E1165 Type 2 diabetes mellitus with hyperglycemia: Secondary | ICD-10-CM | POA: Diagnosis not present

## 2022-04-22 DIAGNOSIS — G300 Alzheimer's disease with early onset: Secondary | ICD-10-CM | POA: Diagnosis not present

## 2022-04-22 DIAGNOSIS — J45909 Unspecified asthma, uncomplicated: Secondary | ICD-10-CM | POA: Diagnosis not present

## 2022-04-22 DIAGNOSIS — M6281 Muscle weakness (generalized): Secondary | ICD-10-CM | POA: Diagnosis not present

## 2022-04-22 DIAGNOSIS — G6281 Critical illness polyneuropathy: Secondary | ICD-10-CM | POA: Diagnosis not present

## 2022-04-22 DIAGNOSIS — E1169 Type 2 diabetes mellitus with other specified complication: Secondary | ICD-10-CM | POA: Diagnosis not present

## 2022-04-22 DIAGNOSIS — R062 Wheezing: Secondary | ICD-10-CM | POA: Diagnosis not present

## 2022-04-22 DIAGNOSIS — E1110 Type 2 diabetes mellitus with ketoacidosis without coma: Secondary | ICD-10-CM | POA: Diagnosis not present

## 2022-04-22 DIAGNOSIS — R600 Localized edema: Secondary | ICD-10-CM | POA: Diagnosis not present

## 2022-04-22 DIAGNOSIS — E11649 Type 2 diabetes mellitus with hypoglycemia without coma: Secondary | ICD-10-CM | POA: Diagnosis not present

## 2022-04-22 DIAGNOSIS — R4182 Altered mental status, unspecified: Secondary | ICD-10-CM | POA: Diagnosis not present

## 2022-04-22 DIAGNOSIS — I672 Cerebral atherosclerosis: Secondary | ICD-10-CM | POA: Diagnosis not present

## 2022-04-22 DIAGNOSIS — I129 Hypertensive chronic kidney disease with stage 1 through stage 4 chronic kidney disease, or unspecified chronic kidney disease: Secondary | ICD-10-CM | POA: Diagnosis not present

## 2022-04-22 DIAGNOSIS — E785 Hyperlipidemia, unspecified: Secondary | ICD-10-CM | POA: Diagnosis not present

## 2022-04-22 DIAGNOSIS — R400 Somnolence: Secondary | ICD-10-CM | POA: Diagnosis not present

## 2022-04-22 DIAGNOSIS — H8110 Benign paroxysmal vertigo, unspecified ear: Secondary | ICD-10-CM | POA: Diagnosis not present

## 2022-04-22 DIAGNOSIS — G6289 Other specified polyneuropathies: Secondary | ICD-10-CM | POA: Diagnosis not present

## 2022-04-27 DIAGNOSIS — Z79899 Other long term (current) drug therapy: Secondary | ICD-10-CM | POA: Diagnosis not present

## 2022-04-27 DIAGNOSIS — R34 Anuria and oliguria: Secondary | ICD-10-CM | POA: Diagnosis not present

## 2022-04-27 DIAGNOSIS — R309 Painful micturition, unspecified: Secondary | ICD-10-CM | POA: Diagnosis not present

## 2022-04-28 DIAGNOSIS — G912 (Idiopathic) normal pressure hydrocephalus: Secondary | ICD-10-CM | POA: Diagnosis not present

## 2022-04-29 ENCOUNTER — Other Ambulatory Visit: Payer: Self-pay | Admitting: Neurosurgery

## 2022-04-29 DIAGNOSIS — R0981 Nasal congestion: Secondary | ICD-10-CM | POA: Diagnosis not present

## 2022-04-29 DIAGNOSIS — R531 Weakness: Secondary | ICD-10-CM | POA: Diagnosis not present

## 2022-04-29 DIAGNOSIS — G912 (Idiopathic) normal pressure hydrocephalus: Secondary | ICD-10-CM | POA: Diagnosis not present

## 2022-04-29 DIAGNOSIS — E1169 Type 2 diabetes mellitus with other specified complication: Secondary | ICD-10-CM | POA: Diagnosis not present

## 2022-04-29 DIAGNOSIS — F319 Bipolar disorder, unspecified: Secondary | ICD-10-CM | POA: Diagnosis not present

## 2022-04-29 DIAGNOSIS — R062 Wheezing: Secondary | ICD-10-CM | POA: Diagnosis not present

## 2022-04-29 DIAGNOSIS — M6281 Muscle weakness (generalized): Secondary | ICD-10-CM | POA: Diagnosis not present

## 2022-04-29 DIAGNOSIS — M25531 Pain in right wrist: Secondary | ICD-10-CM | POA: Diagnosis not present

## 2022-04-29 DIAGNOSIS — G300 Alzheimer's disease with early onset: Secondary | ICD-10-CM | POA: Diagnosis not present

## 2022-04-29 DIAGNOSIS — J45909 Unspecified asthma, uncomplicated: Secondary | ICD-10-CM | POA: Diagnosis not present

## 2022-04-29 DIAGNOSIS — E785 Hyperlipidemia, unspecified: Secondary | ICD-10-CM | POA: Diagnosis not present

## 2022-04-29 DIAGNOSIS — G6281 Critical illness polyneuropathy: Secondary | ICD-10-CM | POA: Diagnosis not present

## 2022-04-29 DIAGNOSIS — E43 Unspecified severe protein-calorie malnutrition: Secondary | ICD-10-CM | POA: Diagnosis not present

## 2022-04-29 DIAGNOSIS — R4182 Altered mental status, unspecified: Secondary | ICD-10-CM | POA: Diagnosis not present

## 2022-04-29 DIAGNOSIS — I10 Essential (primary) hypertension: Secondary | ICD-10-CM | POA: Diagnosis not present

## 2022-04-29 DIAGNOSIS — G6289 Other specified polyneuropathies: Secondary | ICD-10-CM | POA: Diagnosis not present

## 2022-04-29 DIAGNOSIS — E1165 Type 2 diabetes mellitus with hyperglycemia: Secondary | ICD-10-CM | POA: Diagnosis not present

## 2022-04-29 DIAGNOSIS — F29 Unspecified psychosis not due to a substance or known physiological condition: Secondary | ICD-10-CM | POA: Diagnosis not present

## 2022-04-29 DIAGNOSIS — N179 Acute kidney failure, unspecified: Secondary | ICD-10-CM | POA: Diagnosis not present

## 2022-04-29 DIAGNOSIS — H8110 Benign paroxysmal vertigo, unspecified ear: Secondary | ICD-10-CM | POA: Diagnosis not present

## 2022-04-29 DIAGNOSIS — R600 Localized edema: Secondary | ICD-10-CM | POA: Diagnosis not present

## 2022-04-29 DIAGNOSIS — F3132 Bipolar disorder, current episode depressed, moderate: Secondary | ICD-10-CM | POA: Diagnosis not present

## 2022-04-29 DIAGNOSIS — R400 Somnolence: Secondary | ICD-10-CM | POA: Diagnosis not present

## 2022-04-29 DIAGNOSIS — I129 Hypertensive chronic kidney disease with stage 1 through stage 4 chronic kidney disease, or unspecified chronic kidney disease: Secondary | ICD-10-CM | POA: Diagnosis not present

## 2022-04-29 DIAGNOSIS — E1110 Type 2 diabetes mellitus with ketoacidosis without coma: Secondary | ICD-10-CM | POA: Diagnosis not present

## 2022-05-08 DIAGNOSIS — E785 Hyperlipidemia, unspecified: Secondary | ICD-10-CM | POA: Diagnosis not present

## 2022-05-08 DIAGNOSIS — R531 Weakness: Secondary | ICD-10-CM | POA: Diagnosis not present

## 2022-05-08 DIAGNOSIS — R4182 Altered mental status, unspecified: Secondary | ICD-10-CM | POA: Diagnosis not present

## 2022-05-08 DIAGNOSIS — G300 Alzheimer's disease with early onset: Secondary | ICD-10-CM | POA: Diagnosis not present

## 2022-05-08 DIAGNOSIS — R400 Somnolence: Secondary | ICD-10-CM | POA: Diagnosis not present

## 2022-05-08 DIAGNOSIS — R600 Localized edema: Secondary | ICD-10-CM | POA: Diagnosis not present

## 2022-05-08 DIAGNOSIS — H8110 Benign paroxysmal vertigo, unspecified ear: Secondary | ICD-10-CM | POA: Diagnosis not present

## 2022-05-08 DIAGNOSIS — M25531 Pain in right wrist: Secondary | ICD-10-CM | POA: Diagnosis not present

## 2022-05-08 DIAGNOSIS — F319 Bipolar disorder, unspecified: Secondary | ICD-10-CM | POA: Diagnosis not present

## 2022-05-08 DIAGNOSIS — J45909 Unspecified asthma, uncomplicated: Secondary | ICD-10-CM | POA: Diagnosis not present

## 2022-05-08 DIAGNOSIS — E1169 Type 2 diabetes mellitus with other specified complication: Secondary | ICD-10-CM | POA: Diagnosis not present

## 2022-05-08 DIAGNOSIS — I129 Hypertensive chronic kidney disease with stage 1 through stage 4 chronic kidney disease, or unspecified chronic kidney disease: Secondary | ICD-10-CM | POA: Diagnosis not present

## 2022-05-08 DIAGNOSIS — F29 Unspecified psychosis not due to a substance or known physiological condition: Secondary | ICD-10-CM | POA: Diagnosis not present

## 2022-05-08 DIAGNOSIS — F3132 Bipolar disorder, current episode depressed, moderate: Secondary | ICD-10-CM | POA: Diagnosis not present

## 2022-05-08 DIAGNOSIS — M6281 Muscle weakness (generalized): Secondary | ICD-10-CM | POA: Diagnosis not present

## 2022-05-08 DIAGNOSIS — G6281 Critical illness polyneuropathy: Secondary | ICD-10-CM | POA: Diagnosis not present

## 2022-05-08 DIAGNOSIS — R0981 Nasal congestion: Secondary | ICD-10-CM | POA: Diagnosis not present

## 2022-05-08 DIAGNOSIS — E1110 Type 2 diabetes mellitus with ketoacidosis without coma: Secondary | ICD-10-CM | POA: Diagnosis not present

## 2022-05-08 DIAGNOSIS — E1165 Type 2 diabetes mellitus with hyperglycemia: Secondary | ICD-10-CM | POA: Diagnosis not present

## 2022-05-08 DIAGNOSIS — I10 Essential (primary) hypertension: Secondary | ICD-10-CM | POA: Diagnosis not present

## 2022-05-08 DIAGNOSIS — G912 (Idiopathic) normal pressure hydrocephalus: Secondary | ICD-10-CM | POA: Diagnosis not present

## 2022-05-08 DIAGNOSIS — R062 Wheezing: Secondary | ICD-10-CM | POA: Diagnosis not present

## 2022-05-08 DIAGNOSIS — G6289 Other specified polyneuropathies: Secondary | ICD-10-CM | POA: Diagnosis not present

## 2022-05-08 DIAGNOSIS — N179 Acute kidney failure, unspecified: Secondary | ICD-10-CM | POA: Diagnosis not present

## 2022-05-08 DIAGNOSIS — E43 Unspecified severe protein-calorie malnutrition: Secondary | ICD-10-CM | POA: Diagnosis not present

## 2022-05-13 DIAGNOSIS — E1165 Type 2 diabetes mellitus with hyperglycemia: Secondary | ICD-10-CM | POA: Diagnosis not present

## 2022-05-13 DIAGNOSIS — R062 Wheezing: Secondary | ICD-10-CM | POA: Diagnosis not present

## 2022-05-13 DIAGNOSIS — G300 Alzheimer's disease with early onset: Secondary | ICD-10-CM | POA: Diagnosis not present

## 2022-05-13 DIAGNOSIS — R400 Somnolence: Secondary | ICD-10-CM | POA: Diagnosis not present

## 2022-05-13 DIAGNOSIS — G6289 Other specified polyneuropathies: Secondary | ICD-10-CM | POA: Diagnosis not present

## 2022-05-13 DIAGNOSIS — M6281 Muscle weakness (generalized): Secondary | ICD-10-CM | POA: Diagnosis not present

## 2022-05-13 DIAGNOSIS — R4182 Altered mental status, unspecified: Secondary | ICD-10-CM | POA: Diagnosis not present

## 2022-05-13 DIAGNOSIS — E1110 Type 2 diabetes mellitus with ketoacidosis without coma: Secondary | ICD-10-CM | POA: Diagnosis not present

## 2022-05-13 DIAGNOSIS — J45909 Unspecified asthma, uncomplicated: Secondary | ICD-10-CM | POA: Diagnosis not present

## 2022-05-13 DIAGNOSIS — G912 (Idiopathic) normal pressure hydrocephalus: Secondary | ICD-10-CM | POA: Diagnosis not present

## 2022-05-13 DIAGNOSIS — G6281 Critical illness polyneuropathy: Secondary | ICD-10-CM | POA: Diagnosis not present

## 2022-05-13 DIAGNOSIS — R0981 Nasal congestion: Secondary | ICD-10-CM | POA: Diagnosis not present

## 2022-05-13 DIAGNOSIS — F319 Bipolar disorder, unspecified: Secondary | ICD-10-CM | POA: Diagnosis not present

## 2022-05-13 DIAGNOSIS — R600 Localized edema: Secondary | ICD-10-CM | POA: Diagnosis not present

## 2022-05-13 DIAGNOSIS — N179 Acute kidney failure, unspecified: Secondary | ICD-10-CM | POA: Diagnosis not present

## 2022-05-13 DIAGNOSIS — H8110 Benign paroxysmal vertigo, unspecified ear: Secondary | ICD-10-CM | POA: Diagnosis not present

## 2022-05-13 DIAGNOSIS — F29 Unspecified psychosis not due to a substance or known physiological condition: Secondary | ICD-10-CM | POA: Diagnosis not present

## 2022-05-13 DIAGNOSIS — E43 Unspecified severe protein-calorie malnutrition: Secondary | ICD-10-CM | POA: Diagnosis not present

## 2022-05-13 DIAGNOSIS — R531 Weakness: Secondary | ICD-10-CM | POA: Diagnosis not present

## 2022-05-13 DIAGNOSIS — M25531 Pain in right wrist: Secondary | ICD-10-CM | POA: Diagnosis not present

## 2022-05-13 DIAGNOSIS — E785 Hyperlipidemia, unspecified: Secondary | ICD-10-CM | POA: Diagnosis not present

## 2022-05-13 DIAGNOSIS — I129 Hypertensive chronic kidney disease with stage 1 through stage 4 chronic kidney disease, or unspecified chronic kidney disease: Secondary | ICD-10-CM | POA: Diagnosis not present

## 2022-05-13 DIAGNOSIS — F3132 Bipolar disorder, current episode depressed, moderate: Secondary | ICD-10-CM | POA: Diagnosis not present

## 2022-05-13 DIAGNOSIS — I10 Essential (primary) hypertension: Secondary | ICD-10-CM | POA: Diagnosis not present

## 2022-05-13 DIAGNOSIS — E1169 Type 2 diabetes mellitus with other specified complication: Secondary | ICD-10-CM | POA: Diagnosis not present

## 2022-05-20 DIAGNOSIS — I129 Hypertensive chronic kidney disease with stage 1 through stage 4 chronic kidney disease, or unspecified chronic kidney disease: Secondary | ICD-10-CM | POA: Diagnosis not present

## 2022-05-20 DIAGNOSIS — R0981 Nasal congestion: Secondary | ICD-10-CM | POA: Diagnosis not present

## 2022-05-20 DIAGNOSIS — H8110 Benign paroxysmal vertigo, unspecified ear: Secondary | ICD-10-CM | POA: Diagnosis not present

## 2022-05-20 DIAGNOSIS — F3132 Bipolar disorder, current episode depressed, moderate: Secondary | ICD-10-CM | POA: Diagnosis not present

## 2022-05-20 DIAGNOSIS — E1169 Type 2 diabetes mellitus with other specified complication: Secondary | ICD-10-CM | POA: Diagnosis not present

## 2022-05-20 DIAGNOSIS — R531 Weakness: Secondary | ICD-10-CM | POA: Diagnosis not present

## 2022-05-20 DIAGNOSIS — E1110 Type 2 diabetes mellitus with ketoacidosis without coma: Secondary | ICD-10-CM | POA: Diagnosis not present

## 2022-05-20 DIAGNOSIS — E785 Hyperlipidemia, unspecified: Secondary | ICD-10-CM | POA: Diagnosis not present

## 2022-05-20 DIAGNOSIS — E43 Unspecified severe protein-calorie malnutrition: Secondary | ICD-10-CM | POA: Diagnosis not present

## 2022-05-20 DIAGNOSIS — R600 Localized edema: Secondary | ICD-10-CM | POA: Diagnosis not present

## 2022-05-20 DIAGNOSIS — R400 Somnolence: Secondary | ICD-10-CM | POA: Diagnosis not present

## 2022-05-20 DIAGNOSIS — G6289 Other specified polyneuropathies: Secondary | ICD-10-CM | POA: Diagnosis not present

## 2022-05-20 DIAGNOSIS — R4182 Altered mental status, unspecified: Secondary | ICD-10-CM | POA: Diagnosis not present

## 2022-05-20 DIAGNOSIS — N179 Acute kidney failure, unspecified: Secondary | ICD-10-CM | POA: Diagnosis not present

## 2022-05-20 DIAGNOSIS — I10 Essential (primary) hypertension: Secondary | ICD-10-CM | POA: Diagnosis not present

## 2022-05-20 DIAGNOSIS — M6281 Muscle weakness (generalized): Secondary | ICD-10-CM | POA: Diagnosis not present

## 2022-05-20 DIAGNOSIS — E1165 Type 2 diabetes mellitus with hyperglycemia: Secondary | ICD-10-CM | POA: Diagnosis not present

## 2022-05-20 DIAGNOSIS — M25531 Pain in right wrist: Secondary | ICD-10-CM | POA: Diagnosis not present

## 2022-05-20 DIAGNOSIS — R062 Wheezing: Secondary | ICD-10-CM | POA: Diagnosis not present

## 2022-05-20 DIAGNOSIS — F319 Bipolar disorder, unspecified: Secondary | ICD-10-CM | POA: Diagnosis not present

## 2022-05-20 DIAGNOSIS — F29 Unspecified psychosis not due to a substance or known physiological condition: Secondary | ICD-10-CM | POA: Diagnosis not present

## 2022-05-20 DIAGNOSIS — J45909 Unspecified asthma, uncomplicated: Secondary | ICD-10-CM | POA: Diagnosis not present

## 2022-05-20 DIAGNOSIS — G912 (Idiopathic) normal pressure hydrocephalus: Secondary | ICD-10-CM | POA: Diagnosis not present

## 2022-05-20 DIAGNOSIS — G300 Alzheimer's disease with early onset: Secondary | ICD-10-CM | POA: Diagnosis not present

## 2022-05-20 DIAGNOSIS — G6281 Critical illness polyneuropathy: Secondary | ICD-10-CM | POA: Diagnosis not present

## 2022-05-27 ENCOUNTER — Encounter (HOSPITAL_COMMUNITY): Payer: Self-pay | Admitting: Neurosurgery

## 2022-05-27 DIAGNOSIS — E1165 Type 2 diabetes mellitus with hyperglycemia: Secondary | ICD-10-CM | POA: Diagnosis not present

## 2022-05-27 DIAGNOSIS — G6289 Other specified polyneuropathies: Secondary | ICD-10-CM | POA: Diagnosis not present

## 2022-05-27 DIAGNOSIS — N179 Acute kidney failure, unspecified: Secondary | ICD-10-CM | POA: Diagnosis not present

## 2022-05-27 DIAGNOSIS — E785 Hyperlipidemia, unspecified: Secondary | ICD-10-CM | POA: Diagnosis not present

## 2022-05-27 DIAGNOSIS — R400 Somnolence: Secondary | ICD-10-CM | POA: Diagnosis not present

## 2022-05-27 DIAGNOSIS — I129 Hypertensive chronic kidney disease with stage 1 through stage 4 chronic kidney disease, or unspecified chronic kidney disease: Secondary | ICD-10-CM | POA: Diagnosis not present

## 2022-05-27 DIAGNOSIS — G300 Alzheimer's disease with early onset: Secondary | ICD-10-CM | POA: Diagnosis not present

## 2022-05-27 DIAGNOSIS — E43 Unspecified severe protein-calorie malnutrition: Secondary | ICD-10-CM | POA: Diagnosis not present

## 2022-05-27 DIAGNOSIS — R0981 Nasal congestion: Secondary | ICD-10-CM | POA: Diagnosis not present

## 2022-05-27 DIAGNOSIS — J45909 Unspecified asthma, uncomplicated: Secondary | ICD-10-CM | POA: Diagnosis not present

## 2022-05-27 DIAGNOSIS — F319 Bipolar disorder, unspecified: Secondary | ICD-10-CM | POA: Diagnosis not present

## 2022-05-27 DIAGNOSIS — R531 Weakness: Secondary | ICD-10-CM | POA: Diagnosis not present

## 2022-05-27 DIAGNOSIS — I10 Essential (primary) hypertension: Secondary | ICD-10-CM | POA: Diagnosis not present

## 2022-05-27 DIAGNOSIS — M6281 Muscle weakness (generalized): Secondary | ICD-10-CM | POA: Diagnosis not present

## 2022-05-27 DIAGNOSIS — M25531 Pain in right wrist: Secondary | ICD-10-CM | POA: Diagnosis not present

## 2022-05-27 DIAGNOSIS — F3132 Bipolar disorder, current episode depressed, moderate: Secondary | ICD-10-CM | POA: Diagnosis not present

## 2022-05-27 DIAGNOSIS — R062 Wheezing: Secondary | ICD-10-CM | POA: Diagnosis not present

## 2022-05-27 DIAGNOSIS — H8110 Benign paroxysmal vertigo, unspecified ear: Secondary | ICD-10-CM | POA: Diagnosis not present

## 2022-05-27 DIAGNOSIS — E1110 Type 2 diabetes mellitus with ketoacidosis without coma: Secondary | ICD-10-CM | POA: Diagnosis not present

## 2022-05-27 DIAGNOSIS — F29 Unspecified psychosis not due to a substance or known physiological condition: Secondary | ICD-10-CM | POA: Diagnosis not present

## 2022-05-27 DIAGNOSIS — E1169 Type 2 diabetes mellitus with other specified complication: Secondary | ICD-10-CM | POA: Diagnosis not present

## 2022-05-27 DIAGNOSIS — R4182 Altered mental status, unspecified: Secondary | ICD-10-CM | POA: Diagnosis not present

## 2022-05-27 DIAGNOSIS — G912 (Idiopathic) normal pressure hydrocephalus: Secondary | ICD-10-CM | POA: Diagnosis not present

## 2022-05-27 DIAGNOSIS — G6281 Critical illness polyneuropathy: Secondary | ICD-10-CM | POA: Diagnosis not present

## 2022-05-27 DIAGNOSIS — R600 Localized edema: Secondary | ICD-10-CM | POA: Diagnosis not present

## 2022-05-28 ENCOUNTER — Encounter (HOSPITAL_COMMUNITY): Payer: Self-pay | Admitting: Neurosurgery

## 2022-05-28 ENCOUNTER — Other Ambulatory Visit: Payer: Self-pay

## 2022-05-28 DIAGNOSIS — K219 Gastro-esophageal reflux disease without esophagitis: Secondary | ICD-10-CM | POA: Diagnosis not present

## 2022-05-28 DIAGNOSIS — E1165 Type 2 diabetes mellitus with hyperglycemia: Secondary | ICD-10-CM | POA: Diagnosis not present

## 2022-05-28 DIAGNOSIS — F319 Bipolar disorder, unspecified: Secondary | ICD-10-CM | POA: Diagnosis not present

## 2022-05-28 DIAGNOSIS — I10 Essential (primary) hypertension: Secondary | ICD-10-CM | POA: Diagnosis not present

## 2022-05-28 DIAGNOSIS — Z79899 Other long term (current) drug therapy: Secondary | ICD-10-CM | POA: Diagnosis not present

## 2022-05-28 NOTE — Anesthesia Preprocedure Evaluation (Addendum)
Anesthesia Evaluation  Patient identified by MRN, date of birth, ID band Patient awake    Reviewed: Allergy & Precautions, NPO status , Patient's Chart, lab work & pertinent test results  Airway Mallampati: II  TM Distance: >3 FB Neck ROM: Full    Dental  (+) Poor Dentition, Dental Advisory Given   Pulmonary asthma , Patient abstained from smoking., former smoker,    Pulmonary exam normal        Cardiovascular hypertension, Pt. on medications and Pt. on home beta blockers  Rhythm:Regular Rate:Normal  Echo: 1. Left ventricular ejection fraction, by estimation, is 60 to 65%. The  left ventricle has normal function. The left ventricle has no regional  wall motion abnormalities. There is mild concentric left ventricular  hypertrophy. Left ventricular diastolic  parameters are consistent with Grade I diastolic dysfunction (impaired  relaxation). Elevated left atrial pressure.  2. Right ventricular systolic function is normal. The right ventricular  size is normal. Tricuspid regurgitation signal is inadequate for assessing  PA pressure.  3. The mitral valve is normal in structure. No evidence of mitral valve  regurgitation. No evidence of mitral stenosis.  4. The aortic valve is normal in structure. Aortic valve regurgitation is  not visualized. No aortic stenosis is present.  5. The inferior vena cava is normal in size with greater than 50%  respiratory variability, suggesting right atrial pressure of 3 mmHg.    Neuro/Psych PSYCHIATRIC DISORDERS Anxiety Depression Bipolar Disorder Dementia    GI/Hepatic negative GI ROS, Neg liver ROS,   Endo/Other  diabetes, Type 2, Insulin Dependent  Renal/GU Renal disease     Musculoskeletal negative musculoskeletal ROS (+)   Abdominal   Peds  Hematology negative hematology ROS (+)   Anesthesia Other Findings   Reproductive/Obstetrics                            Anesthesia Physical Anesthesia Plan  ASA: 3  Anesthesia Plan: General   Post-op Pain Management:    Induction: Intravenous  PONV Risk Score and Plan: 4 or greater and Ondansetron, Dexamethasone and Treatment may vary due to age or medical condition  Airway Management Planned: Oral ETT  Additional Equipment: None  Intra-op Plan:   Post-operative Plan: Extubation in OR  Informed Consent: I have reviewed the patients History and Physical, chart, labs and discussed the procedure including the risks, benefits and alternatives for the proposed anesthesia with the patient or authorized representative who has indicated his/her understanding and acceptance.     Dental advisory given and Consent reviewed with POA  Plan Discussed with: CRNA  Anesthesia Plan Comments: (PAT note written 05/28/2022 by Myra Gianotti, PA-C. )      Anesthesia Quick Evaluation

## 2022-05-28 NOTE — Progress Notes (Signed)
Surgical Instructions  Carolyn Norton procedure is scheduled on Friday, 05/29/22 at  10:15 AM.  Report to [facility] Main Entrance "A" at 7:45 AM, then check in with the Admitting office.  Call this number if you have problems the morning of surgery:  [phone]   Remember:  Do not eat or drink after midnight the night tonight.     Take these medicines the morning of surgery with A SIP OF WATER: Amlodipine (Norvasc) Divalproex (Depakote) Metoprolol (Lopressor) Venlafaxine (Effexor-XR) Acetaminophen (Tylenol) - prn Albuterol nebulizer - prn Albuterol inhaler - prn (have pt bring inhaler with her day of surgery)  As of today, STOP taking any Aspirin (unless otherwise instructed by your surgeon) Aleve, Naproxen, Ibuprofen, Motrin, Advil, Goody's, BC's, all herbal medications, fish oil, and all vitamins.  HOLD Dulglutide (Trulicity) today, 05/28/22.    Do not take Metformin morning of surgery  THE NIGHT BEFORE SURGERY, take _5_ units of _Levemir_insulin.     THE MORNING OF SURGERY, take __5__ units of _Levemir__insulin.  The day of surgery, do not take Insulin Lispro (Humalog)  How do I manage my blood sugar before surgery? Check your blood sugar at least 4 times a day, starting 2 days before surgery, to make sure that the level is not too high or low.  Check your blood sugar the morning of your surgery when you wake up and every 2 hours until you get to the Short Stay unit.  If your blood sugar is less than 70 mg/dL, you will need to treat for low blood sugar: Do not take insulin. Treat a low blood sugar (less than 70 mg/dL) with  cup of clear juice (cranberry or apple), 4 glucose tablets, OR glucose gel. Recheck blood sugar in 15 minutes after treatment (to make sure it is greater than 70 mg/dL). If your blood sugar is not greater than 70 mg/dL on recheck, call [phone] for further instructions. Report your blood sugar to the short stay nurse when you get to Short  Stay.  If you are admitted to the hospital after surgery: Your blood sugar will be checked by the staff and you will probably be given insulin after surgery (instead of oral diabetes medicines) to make sure you have good blood sugar levels. The goal for blood sugar control after surgery is 80-180 mg/dL.           Do not wear jewelry or makeup. Do not wear lotions, powders, perfumes/cologne or deodorant. Do not shave 48 hours prior to surgery.  . Do not bring valuables to the hospital. Do not wear nail polish, gel polish, artificial nails, or any other type of covering on natural nails (fingers and toes) If you have artificial nails or gel coating that need to be removed by a nail salon, please have this removed prior to surgery. Artificial nails or gel coating may interfere with anesthesia's ability to adequately monitor your vital signs.  [facility] is not responsible for any belongings or valuables.     Contacts, glasses, hearing aids, dentures or partials may not be worn into surgery, please bring cases for these belongings   For patients admitted to the hospital, discharge time will be determined by your treatment team.  SURGICAL WAITING ROOM VISITATION Patients having surgery or a procedure may have no more than 2 support people in the waiting area - these visitors may rotate.   Children under the age of 12 must have an adult with them who is not the patient. If the  patient needs to stay at the hospital during part of their recovery, the visitor guidelines for inpatient rooms apply. Pre-op nurse will coordinate an appropriate time for 1 support person to accompany patient in pre-op.  This support person may not rotate.   Please refer to the [facility] website for the visitor guidelines for Inpatients (after your surgery is over and you are in a regular room).    Special instructions:    Oral Hygiene is also important to reduce your risk of infection.  Remember - BRUSH YOUR TEETH  THE MORNING OF SURGERY WITH YOUR REGULAR TOOTHPASTE  [facility]- Preparing For Surgery  Before surgery, you can play an important role. Because skin is not sterile, your skin needs to be as free of germs as possible. You can reduce the number of germs on your skin by washing with CHG (chlorahexidine gluconate) Soap before surgery.  CHG is an antiseptic cleaner which kills germs and bonds with the skin to continue killing germs even after washing - may use another antibacterial soap if CHG is not available.  Please do not use if you have an allergy to CHG or antibacterial soaps. If your skin becomes reddened/irritated stop using the CHG.  Do not shave (including legs and underarms) for at least 48 hours prior to first CHG shower. It is OK to shave your face.  Please follow these instructions carefully.    Shower the NIGHT BEFORE SURGERY and the MORNING OF SURGERY with CHG Soap.   If you chose to wash your hair, wash your hair first as usual with your normal shampoo. After you shampoo, rinse your hair and body thoroughly to remove the shampoo.  Then [employer] and genitals (private parts) with your normal soap and rinse thoroughly to remove soap.  After that Use CHG Soap as you would any other liquid soap. You can apply CHG directly to the skin and wash gently with a scrungie or a clean washcloth.   Apply the CHG Soap to your body ONLY FROM THE NECK DOWN.  Do not use on open wounds or open sores. Avoid contact with your eyes, ears, mouth and genitals (private parts). Wash Face and genitals (private parts)  with your normal soap.   Wash thoroughly, paying special attention to the area where your surgery will be performed.  Thoroughly rinse your body with warm water from the neck down.  DO NOT shower/wash with your normal soap after using and rinsing off the CHG Soap.  Pat yourself dry with a CLEAN TOWEL.  Wear CLEAN PAJAMAS to bed the night before surgery  Place CLEAN SHEETS on your bed the  night before your surgery  DO NOT SLEEP WITH PETS.  Day of Surgery: Take a shower with CHG soap. Wear Clean/Comfortable clothing the morning of surgery Do not apply any deodorants/lotions.   Remember to brush your teeth WITH YOUR REGULAR TOOTHPASTE.   Any questions today, please call me, Lilia Pro, RN at [phone].

## 2022-05-28 NOTE — Progress Notes (Signed)
Anesthesia Chart Review: Carolyn Norton  Case: [id number] Date/Time: 05/29/22 1000   Procedure: Ventriculoperitoneal shunt placement   Anesthesia type: General   Pre-op diagnosis: Normal pressure hydrocephalus   Location: [facility] OR ROOM 20 / [facility]   Surgeons: Carolyn Kos, MD       DISCUSSION: Patient is a 56 year old female scheduled for the above procedure.  History includes formers smoker (quit 04/07/21), DM2, asthma, HLD, HTN, CKD, Bipolar disorder, normal pressure hydrocephalus. Carolyn Norton  10/2020 admission for acute metabolic encephalopathy with "seizure-like activity" (not witnessed) and AKI in setting of cocaine use.   Prolonged [facility] admission 01/11/22-04/30/21 with generalized weakness, multiple falls, severe dehydration with anorexia and failure to thrive, hyperglycemia, and metabolic encephalopathy, known DM and  Bipolar and substance abuse disorders. Hospitalist admitted with neurology and psychiatry consults. Imaging suggestive of normal pressure hydrocephalus but with only 1 of 3 lumbar punctures revealing a significant volume and did not have a significant change in her symptomology, so unclear if contributing to her symptoms. EEG showed moderate diffuse encephalopathy, no seizures.  Mentation improved after addition of Zyprexa. PT worked on strength. Discharged to SNF for ongoing rehab therapies.    [facility] admission 02/24/22-02/26/22 for DKA. Admitted to ICU, treated with IVF, IV insulin. A1c 8.9% on 02/24/22, previously 8.8% 03/20/21 and 11.0% 01/12/21.  Last neurology visit with Dr. April Norton on 12/23/21. She continued to be physically deconditioned with evidence of encephalopathy on exam but able to follow commands. Continued PT recommended. Also wrote, "Even though I do not believe patient current condition is due only to NPH, I do think she may benefit from a shunt because there was a report that patient cognition and functional status improved following a large-volume tap." She was  advised neurosurgery follow-up.    She is a same day work-up, so further evaluation by her anesthesia team on the day of surgery with updated labs on arrival. She is on Trulicity weekly on Thursdays. SNF staff were instructed to hold 05/28/22 dose which they had not yet given her when our PAT RN called to review presurgical instructions. Case is posted for general anesthesia.    VS: LMP 01/26/2017 (Approximate)  BP Readings from Last 3 Encounters:  02/26/22 125/75  12/23/21 113/72  09/16/21 104/73   Pulse Readings from Last 3 Encounters:  02/26/22 76  12/23/21 80  09/16/21 76     PROVIDERS: System, Provider Not In Alric Ran, MD is neurologist   LABS: For day of surgery as indicated. Last results in [facility] include: Lab Results  Component Value Date   WBC 12.4 (H) 02/25/2022   HGB 10.7 (L) 02/25/2022   HCT 32.0 (L) 02/25/2022   PLT 254 02/25/2022   GLUCOSE 406 (H) 02/26/2022   ALT 12 09/16/2021   AST 12 09/16/2021   NA 133 (L) 02/26/2022   K 4.6 02/26/2022   CL 100 02/26/2022   CREATININE 0.80 02/26/2022   BUN 12 02/26/2022   CO2 27 02/26/2022   HGBA1C 8.9 (H) 02/24/2022    Overnight EEG with video 04/01/21: IMPRESSION: This study is suggestive of moderate diffuse encephalopathy, nonspecific etiology but could be secondary to toxic-metabolic causes.  No seizures or definite epileptiform discharges were seen throughout the recording.   IMAGES: CT Head 04/22/22: IMPRESSION: Stable ventricle caliber and chronic findings detailed above. No acute abnormality.  1V PCXR 02/24/22: IMPRESSION: There are no signs of alveolar pulmonary edema or focal pulmonary consolidation. There is prominence of interstitial markings in both  lower lung fields which may suggest crowding of markings due to poor inspiration or mild interstitial edema or interstitial pneumonia.    EKG: Last EKG seen 04/21/21: Normal sinus rhythm Low voltage QRS Poor R wave progression Abnormal  ECG Confirmed by Carolyn Norton [phone]) on 04/21/2021 6:49:13 PM   CV: Echo 04/08/21: IMPRESSIONS   1. Left ventricular ejection fraction, by estimation, is 60 to 65%. The  left ventricle has normal function. The left ventricle has no regional  wall motion abnormalities. There is mild concentric left ventricular  hypertrophy. Left ventricular diastolic  parameters are consistent with Grade I diastolic dysfunction (impaired  relaxation). Elevated left atrial pressure.   2. Right ventricular systolic function is normal. The right ventricular  size is normal. Tricuspid regurgitation signal is inadequate for assessing  PA pressure.   3. The mitral valve is normal in structure. No evidence of mitral valve  regurgitation. No evidence of mitral stenosis.   4. The aortic valve is normal in structure. Aortic valve regurgitation is  not visualized. No aortic stenosis is present.   5. The inferior vena cava is normal in size with greater than 50%  respiratory variability, suggesting right atrial pressure of 3 mmHg.    Past Medical History:  Diagnosis Date   Alzheimer disease ([place])    from facility diagnosis   Anxiety    Asthma    Bipolar disorder ([place])    Chronic kidney disease    stage 1- per facility   COVID    History of   Depression    Diabetes mellitus without complication ([place])    type 2   Hyperlipidemia    Hypertension    Normal pressure hydrocephalus ([facility])    Seasonal allergies     Past Surgical History:  Procedure Laterality Date   BREAST EXCISIONAL BIOPSY Right    BREAST SURGERY     right breast    MEDICATIONS: No current facility-administered medications for this encounter.    acetaminophen (TYLENOL) 325 MG tablet   albuterol (VENTOLIN HFA) 108 (90 Base) MCG/ACT inhaler   amLODipine (NORVASC) 5 MG tablet   atorvastatin (LIPITOR) 20 MG tablet   benzocaine (ANBESOL) 10 % mucosal gel   bisacodyl (DULCOLAX) 10 MG suppository   divalproex (DEPAKOTE) 250 MG DR  tablet   Dulaglutide (TRULICITY) A999333 0000000 SOPN   Emollient (EUCERIN) lotion   fosfomycin (MONUROL) 3 g PACK   insulin detemir (LEVEMIR) 100 UNIT/ML injection   insulin lispro (HUMALOG) 100 UNIT/ML injection   ipratropium-albuterol (DUONEB) 0.5-2.5 (3) MG/3ML SOLN   metFORMIN (GLUCOPHAGE) 1000 MG tablet   metoprolol tartrate (LOPRESSOR) 50 MG tablet   mirtazapine (REMERON) 15 MG tablet   Multiple Vitamins-Minerals (MULTIVITAMIN WITH MINERALS) tablet   polyethylene glycol (MIRALAX / GLYCOLAX) 17 g packet   senna (SENOKOT) 8.6 MG TABS tablet   venlafaxine XR (EFFEXOR-XR) 150 MG 24 hr capsule   AMINO ACIDS-PROTEIN HYDROLYS PO    Myra Gianotti, PA-C Surgical Short Stay/Anesthesiology [facility] Phone [phone] [facility] Phone [phone] 05/28/2022 11:43 AM

## 2022-05-29 ENCOUNTER — Inpatient Hospital Stay (HOSPITAL_COMMUNITY): Payer: Medicaid Other | Admitting: Vascular Surgery

## 2022-05-29 ENCOUNTER — Inpatient Hospital Stay (HOSPITAL_COMMUNITY)
Admission: RE | Admit: 2022-05-29 | Discharge: 2022-05-30 | DRG: 032 | Disposition: A | Discharge status: Skilled Nursing Facility | Payer: Medicaid Other | Attending: Neurosurgery | Admitting: Neurosurgery

## 2022-05-29 ENCOUNTER — Inpatient Hospital Stay (HOSPITAL_COMMUNITY)
Admission: RE | Disposition: A | Discharge status: Skilled Nursing Facility | Payer: Self-pay | Source: Home / Self Care | Attending: Neurosurgery

## 2022-05-29 ENCOUNTER — Other Ambulatory Visit: Payer: Self-pay

## 2022-05-29 ENCOUNTER — Encounter (HOSPITAL_COMMUNITY): Payer: Self-pay | Admitting: Neurosurgery

## 2022-05-29 DIAGNOSIS — Z48812 Encounter for surgical aftercare following surgery on the circulatory system: Secondary | ICD-10-CM | POA: Diagnosis not present

## 2022-05-29 DIAGNOSIS — E785 Hyperlipidemia, unspecified: Secondary | ICD-10-CM | POA: Diagnosis present

## 2022-05-29 DIAGNOSIS — J45909 Unspecified asthma, uncomplicated: Secondary | ICD-10-CM | POA: Diagnosis present

## 2022-05-29 DIAGNOSIS — F0283 Dementia in other diseases classified elsewhere, unspecified severity, with mood disturbance: Secondary | ICD-10-CM | POA: Diagnosis present

## 2022-05-29 DIAGNOSIS — M25531 Pain in right wrist: Secondary | ICD-10-CM | POA: Diagnosis not present

## 2022-05-29 DIAGNOSIS — Z87891 Personal history of nicotine dependence: Secondary | ICD-10-CM

## 2022-05-29 DIAGNOSIS — E119 Type 2 diabetes mellitus without complications: Secondary | ICD-10-CM

## 2022-05-29 DIAGNOSIS — F319 Bipolar disorder, unspecified: Secondary | ICD-10-CM | POA: Diagnosis present

## 2022-05-29 DIAGNOSIS — G300 Alzheimer's disease with early onset: Secondary | ICD-10-CM | POA: Diagnosis not present

## 2022-05-29 DIAGNOSIS — G912 (Idiopathic) normal pressure hydrocephalus: Secondary | ICD-10-CM

## 2022-05-29 DIAGNOSIS — R4182 Altered mental status, unspecified: Secondary | ICD-10-CM | POA: Diagnosis not present

## 2022-05-29 DIAGNOSIS — F02B3 Dementia in other diseases classified elsewhere, moderate, with mood disturbance: Secondary | ICD-10-CM | POA: Diagnosis not present

## 2022-05-29 DIAGNOSIS — Z794 Long term (current) use of insulin: Secondary | ICD-10-CM

## 2022-05-29 DIAGNOSIS — R600 Localized edema: Secondary | ICD-10-CM | POA: Diagnosis not present

## 2022-05-29 DIAGNOSIS — I10 Essential (primary) hypertension: Secondary | ICD-10-CM | POA: Diagnosis present

## 2022-05-29 DIAGNOSIS — G9340 Encephalopathy, unspecified: Secondary | ICD-10-CM | POA: Diagnosis present

## 2022-05-29 DIAGNOSIS — F3132 Bipolar disorder, current episode depressed, moderate: Secondary | ICD-10-CM | POA: Diagnosis not present

## 2022-05-29 DIAGNOSIS — G6281 Critical illness polyneuropathy: Secondary | ICD-10-CM | POA: Diagnosis not present

## 2022-05-29 DIAGNOSIS — E1110 Type 2 diabetes mellitus with ketoacidosis without coma: Secondary | ICD-10-CM | POA: Diagnosis not present

## 2022-05-29 DIAGNOSIS — Z79899 Other long term (current) drug therapy: Secondary | ICD-10-CM

## 2022-05-29 DIAGNOSIS — E1169 Type 2 diabetes mellitus with other specified complication: Secondary | ICD-10-CM | POA: Diagnosis not present

## 2022-05-29 DIAGNOSIS — I129 Hypertensive chronic kidney disease with stage 1 through stage 4 chronic kidney disease, or unspecified chronic kidney disease: Secondary | ICD-10-CM | POA: Diagnosis not present

## 2022-05-29 DIAGNOSIS — F29 Unspecified psychosis not due to a substance or known physiological condition: Secondary | ICD-10-CM | POA: Diagnosis not present

## 2022-05-29 DIAGNOSIS — H8110 Benign paroxysmal vertigo, unspecified ear: Secondary | ICD-10-CM | POA: Diagnosis not present

## 2022-05-29 DIAGNOSIS — G309 Alzheimer's disease, unspecified: Secondary | ICD-10-CM | POA: Diagnosis present

## 2022-05-29 DIAGNOSIS — G6289 Other specified polyneuropathies: Secondary | ICD-10-CM | POA: Diagnosis not present

## 2022-05-29 DIAGNOSIS — E11649 Type 2 diabetes mellitus with hypoglycemia without coma: Secondary | ICD-10-CM | POA: Diagnosis not present

## 2022-05-29 DIAGNOSIS — Z982 Presence of cerebrospinal fluid drainage device: Secondary | ICD-10-CM | POA: Diagnosis not present

## 2022-05-29 DIAGNOSIS — M6281 Muscle weakness (generalized): Secondary | ICD-10-CM | POA: Diagnosis not present

## 2022-05-29 DIAGNOSIS — R062 Wheezing: Secondary | ICD-10-CM | POA: Diagnosis not present

## 2022-05-29 DIAGNOSIS — R0981 Nasal congestion: Secondary | ICD-10-CM | POA: Diagnosis not present

## 2022-05-29 DIAGNOSIS — E1165 Type 2 diabetes mellitus with hyperglycemia: Secondary | ICD-10-CM | POA: Diagnosis not present

## 2022-05-29 DIAGNOSIS — G919 Hydrocephalus, unspecified: Secondary | ICD-10-CM

## 2022-05-29 DIAGNOSIS — R400 Somnolence: Secondary | ICD-10-CM | POA: Diagnosis not present

## 2022-05-29 DIAGNOSIS — Z7984 Long term (current) use of oral hypoglycemic drugs: Secondary | ICD-10-CM

## 2022-05-29 DIAGNOSIS — N179 Acute kidney failure, unspecified: Secondary | ICD-10-CM | POA: Diagnosis not present

## 2022-05-29 HISTORY — DX: (Idiopathic) normal pressure hydrocephalus: G91.2

## 2022-05-29 HISTORY — PX: VENTRICULOPERITONEAL SHUNT: SHX204

## 2022-05-29 LAB — CBC
HCT: 41.1 % (ref 36.0–46.0)
Hemoglobin: 13.3 g/dL (ref 12.0–15.0)
MCH: 30 pg (ref 26.0–34.0)
MCHC: 32.4 g/dL (ref 30.0–36.0)
MCV: 92.6 fL (ref 80.0–100.0)
Platelets: 248 10*3/uL (ref 150–400)
RBC: 4.44 MIL/uL (ref 3.87–5.11)
RDW: 13.7 % (ref 11.5–15.5)
WBC: 10.7 10*3/uL — ABNORMAL HIGH (ref 4.0–10.5)
nRBC: 0 % (ref 0.0–0.2)

## 2022-05-29 LAB — BASIC METABOLIC PANEL
Anion gap: 7 (ref 5–15)
BUN: 14 mg/dL (ref 6–20)
CO2: 25 mmol/L (ref 22–32)
Calcium: 8.9 mg/dL (ref 8.9–10.3)
Chloride: 102 mmol/L (ref 98–111)
Creatinine, Ser: 0.62 mg/dL (ref 0.44–1.00)
GFR, Estimated: >60 mL/min (ref >60)
Glucose, Bld: 335 mg/dL — ABNORMAL HIGH (ref 70–99)
Potassium: 3.9 mmol/L (ref 3.5–5.1)
Sodium: 134 mmol/L — ABNORMAL LOW (ref 135–145)

## 2022-05-29 LAB — GLUCOSE, CAPILLARY
Glucose-Capillary: 259 mg/dL — ABNORMAL HIGH (ref 70–99)
Glucose-Capillary: 278 mg/dL — ABNORMAL HIGH (ref 70–99)
Glucose-Capillary: 270 mg/dL — ABNORMAL HIGH (ref 70–99)
Glucose-Capillary: 186 mg/dL — ABNORMAL HIGH (ref 70–99)
Glucose-Capillary: 369 mg/dL — ABNORMAL HIGH (ref 70–99)

## 2022-05-29 LAB — SURGICAL PCR SCREEN
MRSA, PCR: POSITIVE — AB
Staphylococcus aureus: POSITIVE — AB

## 2022-05-29 SURGERY — SHUNT INSERTION VENTRICULAR-PERITONEAL
Anesthesia: General

## 2022-05-29 MED ORDER — CHLORHEXIDINE GLUCONATE CLOTH 2 % EX PADS: 6.0000 | MEDICATED_PAD | Freq: Every day | CUTANEOUS | Status: DC

## 2022-05-29 MED ORDER — EPHEDRINE 5 MG/ML INJ
INTRAVENOUS | Status: AC
  Filled 2022-05-29: qty 5

## 2022-05-29 MED ORDER — VANCOMYCIN HCL IN DEXTROSE 1-5 GM/200ML-% IV SOLN
1000.0000 mg | INTRAVENOUS | Status: AC
  Administered 2022-05-29: 1000 mg via INTRAVENOUS
  Filled 2022-05-29: qty 200

## 2022-05-29 MED ORDER — POLYETHYLENE GLYCOL 3350 17 G PO PACK
17.0000 g | PACK | Freq: Every day | ORAL | Status: DC
  Administered 2022-05-30: 17 g via ORAL
  Filled 2022-05-29: qty 1

## 2022-05-29 MED ORDER — THROMBIN 5000 UNITS EX SOLR
CUTANEOUS | Status: AC
  Filled 2022-05-29: qty 5000

## 2022-05-29 MED ORDER — POTASSIUM CHLORIDE IN NACL 20-0.9 MEQ/L-% IV SOLN
INTRAVENOUS | Status: DC
  Filled 2022-05-29 (×2): qty 1000

## 2022-05-29 MED ORDER — ONDANSETRON HCL 4 MG/2ML IJ SOLN
INTRAMUSCULAR | Status: DC | PRN
  Administered 2022-05-29: 4 mg via INTRAVENOUS

## 2022-05-29 MED ORDER — CEFAZOLIN SODIUM-DEXTROSE 2-3 GM-%(50ML) IV SOLR
INTRAVENOUS | Status: DC | PRN
  Administered 2022-05-29: 2 g via INTRAVENOUS

## 2022-05-29 MED ORDER — ROCURONIUM BROMIDE 10 MG/ML (PF) SYRINGE
PREFILLED_SYRINGE | INTRAVENOUS | Status: AC
  Filled 2022-05-29: qty 10

## 2022-05-29 MED ORDER — CHLORHEXIDINE GLUCONATE 0.12 % MT SOLN
15.0000 mL | Freq: Once | OROMUCOSAL | Status: AC
  Administered 2022-05-29: 15 mL via OROMUCOSAL
  Filled 2022-05-29: qty 15

## 2022-05-29 MED ORDER — ONDANSETRON HCL 4 MG/2ML IJ SOLN
INTRAMUSCULAR | Status: AC
  Filled 2022-05-29: qty 2

## 2022-05-29 MED ORDER — PROPOFOL 10 MG/ML IV BOLUS
INTRAVENOUS | Status: DC | PRN
  Administered 2022-05-29: 130 mg via INTRAVENOUS

## 2022-05-29 MED ORDER — LABETALOL HCL 5 MG/ML IV SOLN: 10.0000 mg | INTRAVENOUS | Status: DC | PRN

## 2022-05-29 MED ORDER — ATORVASTATIN CALCIUM 10 MG PO TABS
20.0000 mg | ORAL_TABLET | Freq: Every day | ORAL | Status: DC
  Administered 2022-05-29: 20 mg via ORAL
  Filled 2022-05-29: qty 2

## 2022-05-29 MED ORDER — LACTATED RINGERS IV SOLN: INTRAVENOUS | Status: DC

## 2022-05-29 MED ORDER — NALOXONE HCL 0.4 MG/ML IJ SOLN
INTRAMUSCULAR | Status: DC | PRN
  Administered 2022-05-29: 40 ug via INTRAVENOUS

## 2022-05-29 MED ORDER — PHENYLEPHRINE HCL-NACL 20-0.9 MG/250ML-% IV SOLN
INTRAVENOUS | Status: DC | PRN
  Administered 2022-05-29: 50 ug/min via INTRAVENOUS

## 2022-05-29 MED ORDER — BACITRACIN ZINC 500 UNIT/GM EX OINT
TOPICAL_OINTMENT | CUTANEOUS | Status: AC
  Filled 2022-05-29: qty 28.35

## 2022-05-29 MED ORDER — ACETAMINOPHEN 325 MG PO TABS: 325.0000 mg | ORAL_TABLET | ORAL | Status: DC | PRN

## 2022-05-29 MED ORDER — OXYCODONE HCL 5 MG PO TABS: 5.0000 mg | ORAL_TABLET | Freq: Once | ORAL | Status: DC | PRN

## 2022-05-29 MED ORDER — VANCOMYCIN HCL 1000 MG IV SOLR: 1000.0000 mg | Freq: Two times a day (BID) | INTRAVENOUS | Status: DC

## 2022-05-29 MED ORDER — CEFAZOLIN SODIUM-DEXTROSE 2-4 GM/100ML-% IV SOLN
2.0000 g | INTRAVENOUS | Status: DC
  Filled 2022-05-29: qty 100

## 2022-05-29 MED ORDER — ONDANSETRON HCL 4 MG PO TABS: 4.0000 mg | ORAL_TABLET | ORAL | Status: DC | PRN

## 2022-05-29 MED ORDER — DIVALPROEX SODIUM 250 MG PO DR TAB
250.0000 mg | DELAYED_RELEASE_TABLET | Freq: Two times a day (BID) | ORAL | Status: DC
  Administered 2022-05-29 – 2022-05-30 (×2): 250 mg via ORAL
  Filled 2022-05-29 (×3): qty 1

## 2022-05-29 MED ORDER — AMISULPRIDE (ANTIEMETIC) 5 MG/2ML IV SOLN: 10.0000 mg | Freq: Once | INTRAVENOUS | Status: DC | PRN

## 2022-05-29 MED ORDER — LIDOCAINE 2% (20 MG/ML) 5 ML SYRINGE
INTRAMUSCULAR | Status: AC
  Filled 2022-05-29: qty 5

## 2022-05-29 MED ORDER — PHENYLEPHRINE 80 MCG/ML (10ML) SYRINGE FOR IV PUSH (FOR BLOOD PRESSURE SUPPORT)
PREFILLED_SYRINGE | INTRAVENOUS | Status: DC | PRN
  Administered 2022-05-29 (×2): 160 ug via INTRAVENOUS
  Administered 2022-05-29 (×2): 80 ug via INTRAVENOUS
  Administered 2022-05-29: 160 ug via INTRAVENOUS

## 2022-05-29 MED ORDER — DEXAMETHASONE SODIUM PHOSPHATE 10 MG/ML IJ SOLN
INTRAMUSCULAR | Status: DC | PRN
  Administered 2022-05-29: 5 mg via INTRAVENOUS

## 2022-05-29 MED ORDER — CHLORHEXIDINE GLUCONATE CLOTH 2 % EX PADS: 6.0000 | MEDICATED_PAD | Freq: Once | CUTANEOUS | Status: DC

## 2022-05-29 MED ORDER — ALBUTEROL SULFATE HFA 108 (90 BASE) MCG/ACT IN AERS: 2.0000 | INHALATION_SPRAY | RESPIRATORY_TRACT | Status: DC | PRN

## 2022-05-29 MED ORDER — ROCURONIUM BROMIDE 10 MG/ML (PF) SYRINGE
PREFILLED_SYRINGE | INTRAVENOUS | Status: DC | PRN
  Administered 2022-05-29: 70 mg via INTRAVENOUS

## 2022-05-29 MED ORDER — ONDANSETRON HCL 4 MG/2ML IJ SOLN: 4.0000 mg | INTRAMUSCULAR | Status: DC | PRN

## 2022-05-29 MED ORDER — CEFAZOLIN SODIUM-DEXTROSE 2-4 GM/100ML-% IV SOLN
2.0000 g | Freq: Three times a day (TID) | INTRAVENOUS | Status: AC
  Administered 2022-05-29: 2 g via INTRAVENOUS
  Filled 2022-05-29: qty 100

## 2022-05-29 MED ORDER — FENTANYL CITRATE (PF) 100 MCG/2ML IJ SOLN: 25.0000 ug | INTRAMUSCULAR | Status: DC | PRN

## 2022-05-29 MED ORDER — AMLODIPINE BESYLATE 5 MG PO TABS
5.0000 mg | ORAL_TABLET | Freq: Every day | ORAL | Status: DC
  Administered 2022-05-30: 5 mg via ORAL
  Filled 2022-05-29: qty 1

## 2022-05-29 MED ORDER — IPRATROPIUM-ALBUTEROL 0.5-2.5 (3) MG/3ML IN SOLN: 3.0000 mL | Freq: Four times a day (QID) | RESPIRATORY_TRACT | Status: DC | PRN

## 2022-05-29 MED ORDER — 0.9 % SODIUM CHLORIDE (POUR BTL) OPTIME
TOPICAL | Status: DC | PRN
  Administered 2022-05-29: 1000 mL

## 2022-05-29 MED ORDER — EPHEDRINE SULFATE-NACL 50-0.9 MG/10ML-% IV SOSY
PREFILLED_SYRINGE | INTRAVENOUS | Status: DC | PRN
  Administered 2022-05-29 (×3): 5 mg via INTRAVENOUS
  Administered 2022-05-29 (×2): 2.5 mg via INTRAVENOUS

## 2022-05-29 MED ORDER — SENNA 8.6 MG PO TABS
1.0000 | ORAL_TABLET | Freq: Two times a day (BID) | ORAL | Status: DC
  Administered 2022-05-29 – 2022-05-30 (×2): 8.6 mg via ORAL
  Filled 2022-05-29 (×2): qty 1

## 2022-05-29 MED ORDER — FENTANYL CITRATE (PF) 250 MCG/5ML IJ SOLN
INTRAMUSCULAR | Status: DC | PRN
  Administered 2022-05-29: 100 ug via INTRAVENOUS
  Administered 2022-05-29: 50 ug via INTRAVENOUS

## 2022-05-29 MED ORDER — OXYCODONE HCL 5 MG/5ML PO SOLN: 5.0000 mg | Freq: Once | ORAL | Status: DC | PRN

## 2022-05-29 MED ORDER — ORAL CARE MOUTH RINSE: 15.0000 mL | Freq: Once | OROMUCOSAL | Status: AC

## 2022-05-29 MED ORDER — LIDOCAINE-EPINEPHRINE 1 %-1:100000 IJ SOLN
INTRAMUSCULAR | Status: DC | PRN
  Administered 2022-05-29: 10 mL

## 2022-05-29 MED ORDER — PROMETHAZINE HCL 25 MG/ML IJ SOLN: 6.2500 mg | INTRAMUSCULAR | Status: DC | PRN

## 2022-05-29 MED ORDER — PROMETHAZINE HCL 25 MG PO TABS
12.5000 mg | ORAL_TABLET | ORAL | Status: DC | PRN
  Administered 2022-05-30: 12.5 mg via ORAL
  Administered 2022-05-30: 25 mg via ORAL
  Filled 2022-05-29 (×2): qty 1

## 2022-05-29 MED ORDER — NALOXONE HCL 0.4 MG/ML IJ SOLN
INTRAMUSCULAR | Status: AC
  Filled 2022-05-29: qty 1

## 2022-05-29 MED ORDER — LIDOCAINE 2% (20 MG/ML) 5 ML SYRINGE
INTRAMUSCULAR | Status: DC | PRN
  Administered 2022-05-29: 40 mg via INTRAVENOUS

## 2022-05-29 MED ORDER — MIRTAZAPINE 15 MG PO TABS
15.0000 mg | ORAL_TABLET | Freq: Every day | ORAL | Status: DC
  Administered 2022-05-29: 15 mg via ORAL
  Filled 2022-05-29: qty 1

## 2022-05-29 MED ORDER — ACETAMINOPHEN 10 MG/ML IV SOLN: 1000.0000 mg | Freq: Once | INTRAVENOUS | Status: DC | PRN

## 2022-05-29 MED ORDER — ADULT MULTIVITAMIN W/MINERALS CH
1.0000 | ORAL_TABLET | Freq: Every day | ORAL | Status: DC
  Administered 2022-05-30: 1 via ORAL
  Filled 2022-05-29: qty 1

## 2022-05-29 MED ORDER — INSULIN ASPART 100 UNIT/ML IJ SOLN
0.0000 [IU] | INTRAMUSCULAR | Status: AC | PRN
  Administered 2022-05-29: 3 [IU] via SUBCUTANEOUS
  Administered 2022-05-29: 4 [IU] via SUBCUTANEOUS
  Filled 2022-05-29 (×2): qty 1

## 2022-05-29 MED ORDER — PANTOPRAZOLE SODIUM 40 MG IV SOLR
40.0000 mg | Freq: Every day | INTRAVENOUS | Status: DC
  Administered 2022-05-29: 40 mg via INTRAVENOUS
  Filled 2022-05-29: qty 10

## 2022-05-29 MED ORDER — BACITRACIN ZINC 500 UNIT/GM EX OINT
TOPICAL_OINTMENT | CUTANEOUS | Status: DC | PRN
  Administered 2022-05-29: 1 via TOPICAL

## 2022-05-29 MED ORDER — PROPOFOL 10 MG/ML IV BOLUS
INTRAVENOUS | Status: AC
  Filled 2022-05-29: qty 20

## 2022-05-29 MED ORDER — FOSFOMYCIN TROMETHAMINE 3 G PO PACK: 3.0000 g | PACK | ORAL | Status: DC

## 2022-05-29 MED ORDER — SUGAMMADEX SODIUM 200 MG/2ML IV SOLN
INTRAVENOUS | Status: DC | PRN
  Administered 2022-05-29: 200 mg via INTRAVENOUS

## 2022-05-29 MED ORDER — LUBRIDERM SERIOUSLY SENSITIVE EX LOTN: 1.0000 | TOPICAL_LOTION | Freq: Two times a day (BID) | CUTANEOUS | Status: DC | PRN

## 2022-05-29 MED ORDER — ACETAMINOPHEN 160 MG/5ML PO SOLN: 325.0000 mg | ORAL | Status: DC | PRN

## 2022-05-29 MED ORDER — METFORMIN HCL 500 MG PO TABS
1000.0000 mg | ORAL_TABLET | Freq: Two times a day (BID) | ORAL | Status: DC
  Administered 2022-05-30: 1000 mg via ORAL
  Filled 2022-05-29: qty 2

## 2022-05-29 MED ORDER — LIDOCAINE-EPINEPHRINE 1 %-1:100000 IJ SOLN
INTRAMUSCULAR | Status: AC
  Filled 2022-05-29: qty 1

## 2022-05-29 MED ORDER — ACETAMINOPHEN 325 MG PO TABS
650.0000 mg | ORAL_TABLET | Freq: Four times a day (QID) | ORAL | Status: DC | PRN
  Administered 2022-05-29 – 2022-05-30 (×3): 650 mg via ORAL
  Filled 2022-05-29 (×3): qty 2

## 2022-05-29 MED ORDER — VENLAFAXINE HCL ER 75 MG PO CP24
150.0000 mg | ORAL_CAPSULE | Freq: Every day | ORAL | Status: DC
  Administered 2022-05-30: 150 mg via ORAL
  Filled 2022-05-29: qty 2

## 2022-05-29 MED ORDER — INSULIN ASPART 100 UNIT/ML IJ SOLN
0.0000 [IU] | Freq: Three times a day (TID) | INTRAMUSCULAR | Status: DC
  Administered 2022-05-29: 8 [IU] via SUBCUTANEOUS
  Administered 2022-05-30: 5 [IU] via SUBCUTANEOUS
  Administered 2022-05-30: 8 [IU] via SUBCUTANEOUS

## 2022-05-29 MED ORDER — DEXMEDETOMIDINE HCL IN NACL 80 MCG/20ML IV SOLN
INTRAVENOUS | Status: AC
  Filled 2022-05-29: qty 20

## 2022-05-29 MED ORDER — INSULIN DETEMIR 100 UNIT/ML ~~LOC~~ SOLN
10.0000 [IU] | Freq: Two times a day (BID) | SUBCUTANEOUS | Status: DC
  Administered 2022-05-29 – 2022-05-30 (×2): 10 [IU] via SUBCUTANEOUS
  Filled 2022-05-29 (×4): qty 0.1

## 2022-05-29 MED ORDER — MUPIROCIN 2 % EX OINT
1.0000 | TOPICAL_OINTMENT | Freq: Two times a day (BID) | CUTANEOUS | Status: DC
  Administered 2022-05-29 – 2022-05-30 (×2): 1 via NASAL
  Filled 2022-05-29 (×2): qty 22

## 2022-05-29 MED ORDER — PHENYLEPHRINE 80 MCG/ML (10ML) SYRINGE FOR IV PUSH (FOR BLOOD PRESSURE SUPPORT)
PREFILLED_SYRINGE | INTRAVENOUS | Status: AC
  Filled 2022-05-29: qty 10

## 2022-05-29 MED ORDER — BISACODYL 10 MG RE SUPP: 10.0000 mg | Freq: Every day | RECTAL | Status: DC | PRN

## 2022-05-29 MED ORDER — THROMBIN (RECOMBINANT) 5000 UNITS EX SOLR
CUTANEOUS | Status: DC | PRN
  Administered 2022-05-29: 10 mL via TOPICAL

## 2022-05-29 MED ORDER — MIDAZOLAM HCL 2 MG/2ML IJ SOLN
INTRAMUSCULAR | Status: AC
  Filled 2022-05-29: qty 2

## 2022-05-29 MED ORDER — DEXAMETHASONE SODIUM PHOSPHATE 10 MG/ML IJ SOLN
INTRAMUSCULAR | Status: AC
  Filled 2022-05-29: qty 1

## 2022-05-29 MED ORDER — METOPROLOL TARTRATE 50 MG PO TABS
50.0000 mg | ORAL_TABLET | Freq: Two times a day (BID) | ORAL | Status: DC
  Administered 2022-05-29 – 2022-05-30 (×2): 50 mg via ORAL
  Filled 2022-05-29 (×2): qty 1

## 2022-05-29 MED ORDER — FENTANYL CITRATE (PF) 250 MCG/5ML IJ SOLN
INTRAMUSCULAR | Status: AC
  Filled 2022-05-29: qty 5

## 2022-05-29 MED ORDER — DULAGLUTIDE 0.75 MG/0.5ML ~~LOC~~ SOAJ: 0.7500 mg | SUBCUTANEOUS | Status: DC

## 2022-05-29 MED ORDER — BENZOCAINE 10 % MT GEL: 1.0000 | OROMUCOSAL | Status: DC | PRN

## 2022-05-29 MED ORDER — DOCUSATE SODIUM 100 MG PO CAPS
100.0000 mg | ORAL_CAPSULE | Freq: Two times a day (BID) | ORAL | Status: DC
  Administered 2022-05-29 – 2022-05-30 (×2): 100 mg via ORAL
  Filled 2022-05-29 (×2): qty 1

## 2022-05-29 SURGICAL SUPPLY — 63 items
ADH SKN CLS APL DERMABOND .7 (GAUZE/BANDAGES/DRESSINGS) ×1
APL SKNCLS STERI-STRIP NONHPOA (GAUZE/BANDAGES/DRESSINGS) ×1
BAG COUNTER SPONGE SURGICOUNT (BAG) ×2 IMPLANT
BAG SPNG CNTER NS LX DISP (BAG) ×1
BENZOIN TINCTURE PRP APPL 2/3 (GAUZE/BANDAGES/DRESSINGS) ×2 IMPLANT
BLADE SURG 11 STRL SS (BLADE) ×2 IMPLANT
BOOT SUTURE AID YELLOW STND (SUTURE) IMPLANT
BUR ACORN 6.0 PRECISION (BURR) ×2 IMPLANT
CABLE BIPOLOR RESECTION CORD (MISCELLANEOUS) ×2 IMPLANT
CANISTER SUCT 3000ML PPV (MISCELLANEOUS) ×2 IMPLANT
CARTRIDGE OIL MAESTRO DRILL (MISCELLANEOUS) ×2 IMPLANT
CATH VENTRICULAR 7CM (Shunt) IMPLANT
CLIP RANEY DISP (INSTRUMENTS) IMPLANT
DERMABOND ADVANCED .7 DNX12 (GAUZE/BANDAGES/DRESSINGS) ×2 IMPLANT
DIFFUSER DRILL AIR PNEUMATIC (MISCELLANEOUS) ×2 IMPLANT
DRAPE INCISE IOBAN 85X60 (DRAPES) ×2 IMPLANT
DRAPE ORTHO SPLIT 77X108 STRL (DRAPES) ×2
DRAPE POUCH INSTRU U-SHP 10X18 (DRAPES) ×2 IMPLANT
DRAPE SURG 17X23 STRL (DRAPES) IMPLANT
DRAPE SURG ORHT 6 SPLT 77X108 (DRAPES) ×4 IMPLANT
DRSG OPSITE POSTOP 3X4 (GAUZE/BANDAGES/DRESSINGS) IMPLANT
DRSG OPSITE POSTOP 4X6 (GAUZE/BANDAGES/DRESSINGS) IMPLANT
ELECT REM PT RETURN 9FT ADLT (ELECTROSURGICAL) ×1
ELECTRODE REM PT RTRN 9FT ADLT (ELECTROSURGICAL) ×2 IMPLANT
GAUZE 4X4 16PLY ~~LOC~~+RFID DBL (SPONGE) IMPLANT
GLOVE BIO SURGEON STRL SZ7 (GLOVE) IMPLANT
GLOVE BIO SURGEON STRL SZ8 (GLOVE) ×2 IMPLANT
GLOVE BIOGEL PI IND STRL 7.0 (GLOVE) IMPLANT
GLOVE EXAM NITRILE XL STR (GLOVE) IMPLANT
GLOVE INDICATOR 8.5 STRL (GLOVE) ×4 IMPLANT
GOWN STRL REUS W/ TWL LRG LVL3 (GOWN DISPOSABLE) ×2 IMPLANT
GOWN STRL REUS W/ TWL XL LVL3 (GOWN DISPOSABLE) ×2 IMPLANT
GOWN STRL REUS W/TWL 2XL LVL3 (GOWN DISPOSABLE) ×2 IMPLANT
GOWN STRL REUS W/TWL LRG LVL3 (GOWN DISPOSABLE) ×1
GOWN STRL REUS W/TWL XL LVL3 (GOWN DISPOSABLE) ×1
KIT BASIN OR (CUSTOM PROCEDURE TRAY) ×2 IMPLANT
KIT TURNOVER KIT B (KITS) ×2 IMPLANT
NDL HYPO 25X1 1.5 SAFETY (NEEDLE) ×2 IMPLANT
NEEDLE HYPO 25X1 1.5 SAFETY (NEEDLE) ×1 IMPLANT
NS IRRIG 1000ML POUR BTL (IV SOLUTION) ×2 IMPLANT
OIL CARTRIDGE MAESTRO DRILL (MISCELLANEOUS)
PACK LAMINECTOMY NEURO (CUSTOM PROCEDURE TRAY) ×2 IMPLANT
PAD ARMBOARD 7.5X6 YLW CONV (MISCELLANEOUS) ×2 IMPLANT
PATTIES SURGICAL .5 X3 (DISPOSABLE) IMPLANT
PATTIES SURGICAL .75X.75 (GAUZE/BANDAGES/DRESSINGS) IMPLANT
SHEATH PERITONEAL INTRO 61 (SHEATH) ×2 IMPLANT
SHUNT STRATA 11 SNAP REG (Shunt) IMPLANT
SPIKE FLUID TRANSFER (MISCELLANEOUS) ×2 IMPLANT
SPONGE SURGIFOAM ABS GEL SZ50 (HEMOSTASIS) ×2 IMPLANT
SPONGE T-LAP 4X18 ~~LOC~~+RFID (SPONGE) IMPLANT
STAPLER VISISTAT 35W (STAPLE) ×2 IMPLANT
STRIP CLOSURE SKIN 1/2X4 (GAUZE/BANDAGES/DRESSINGS) ×4 IMPLANT
SUT BONE WAX W31G (SUTURE) ×2 IMPLANT
SUT CHROMIC 3 0 PS 2 (SUTURE) IMPLANT
SUT SILK 0 TIES 10X30 (SUTURE) ×2 IMPLANT
SUT VIC AB 2-0 CP2 18 (SUTURE) IMPLANT
SUT VIC AB 2-0 CT1 18 (SUTURE) ×2 IMPLANT
SUT VICRYL 4-0 PS2 18IN ABS (SUTURE) ×2 IMPLANT
SYR 5ML LL (SYRINGE) IMPLANT
TOWEL GREEN STERILE (TOWEL DISPOSABLE) ×2 IMPLANT
TOWEL GREEN STERILE FF (TOWEL DISPOSABLE) ×2 IMPLANT
UNDERPAD 30X36 HEAVY ABSORB (UNDERPADS AND DIAPERS) ×2 IMPLANT
WATER STERILE IRR 1000ML POUR (IV SOLUTION) ×2 IMPLANT

## 2022-05-29 NOTE — Progress Notes (Signed)
I spoke with Caryl Pina, Architect at [facility]. Caryl Pina confirmed which medications patient took this morning. Pt has not had Trulicity since 3/88/82 per Caryl Pina and patient has not had any short acting insulin today.   Patient states she is still sometimes having periods, and just finished periods. Caryl Pina, RN stated that patient does not have periods and no bleeding has been noted by facility staff. Pt listed in chart as postmenopausal. Pt unable to void in pre-op. Patient is incontinent and voided on the way to the hospital. Dr. Smith Robert notified.   Per Buren Kos (at bedside) and HCPOA document in epic, she is HCPOA for patient. Aunt states that one of the patient's MDs stated that patient should not make legal, financial or medical decisions for herself anymore.   Aunt and patient both stated that patient is a Type 1 diabetic and is insulin dependent. Dr Smith Robert notified.

## 2022-05-29 NOTE — Inpatient Diabetes Management (Signed)
Inpatient Diabetes Program Recommendations  AACE/ADA: New Consensus Statement on Inpatient Glycemic Control   Target Ranges:  Prepandial:   less than 140 mg/dL      Peak postprandial:   less than 180 mg/dL (1-2 hours)      Critically ill patients:  140 - 180 mg/dL    Latest Reference Range & Units 05/29/22 07:37 05/29/22 10:26  Glucose-Capillary 70 - 99 mg/dL 259 (H) 278 (H)   Review of Glycemic Control  Diabetes history: DM Outpatient Diabetes medications: Levemir 10 units BID, Humalog 6 units TID with meals, Metformin 3536 mg BID, Trulicity 1.44 mg Qweek (Thursday) Current orders for Inpatient glycemic control: None; in [facility] OR  Inpatient Diabetes Program Recommendations:    Insulin: If patient is admitted, please consider ordering Levemir 5 units BID, Novolog 0-9 units Q4H (if diet ordered would use frequency of AC&HS), and if diet ordered Novolog 3 units TID with meals for meal coverage if patient eats at least 50% of meals.  Thanks, Barnie Alderman, RN, MSN, [place] Diabetes Coordinator Inpatient Diabetes Program [phone] (Team Pager from 8am to [place])

## 2022-05-29 NOTE — H&P (Signed)
Hadlea Furuya is an 56 y.o. female.   Chief Complaint: Headaches walking difficulty incontinence HPI: 56 year old female with increased headaches ataxia incontinence memory and cognitive difficulties progressing over the last year.  Patient was admitted over the summer and imaging showed ventriculomegaly patient underwent lumbar puncture high-volume tap and did get improvement in cognition ambulation with a high-volume lumbar puncture.  Due to this patient was diagnosed as normal pressure hydrocephalus.  So we have recommended ventriculoperitoneal shunt placement.  I have extensively gone over the risks and benefits of that procedure with her as well as perioperative course expectations of outcome and alternatives of surgery and she understands and agrees to proceed forward.  Past Medical History:  Diagnosis Date   Alzheimer disease ([place])    from facility diagnosis   Anxiety    Asthma    Bipolar disorder ([place])    Chronic kidney disease    stage 1- per facility   COVID    History of   Depression    Diabetes mellitus without complication ([place])    Type 2 (type 1 per patient and aunt)   Hyperlipidemia    Hypertension    Normal pressure hydrocephalus (HCC)    Seasonal allergies     Past Surgical History:  Procedure Laterality Date   BREAST EXCISIONAL BIOPSY Right    BREAST SURGERY     right breast    Family History  Problem Relation Age of Onset   Hypertension Mother    Cancer Mother    Kidney cancer Mother    Cancer Father        prostate   Breast cancer Paternal Grandmother    Leukemia Brother    Social History:  reports that she quit smoking about 13 months ago. Her smoking use included cigarettes. She smoked an average of .5 packs per day. She has never used smokeless tobacco. She reports that she does not currently use drugs after having used the following drugs: Cocaine. She reports that she does not drink alcohol.  Allergies:  Allergies  Allergen Reactions    Erythromycin Other (See Comments)    Childhood reaction    Medications Prior to Admission  Medication Sig Dispense Refill   amLODipine (NORVASC) 5 MG tablet Take 1 tablet (5 mg total) by mouth daily.     atorvastatin (LIPITOR) 20 MG tablet Take 1 tablet (20 mg total) by mouth daily. (Patient taking differently: Take 20 mg by mouth at bedtime.) 90 tablet 1   divalproex (DEPAKOTE) 250 MG DR tablet Take 250 mg by mouth 2 (two) times daily.     Dulaglutide (TRULICITY) 9.67 RF/1.6BW SOPN Inject 0.75 mg into the skin once a week. Every Thursday     insulin detemir (LEVEMIR) 100 UNIT/ML injection Inject 10 Units into the skin 2 (two) times daily.     insulin lispro (HUMALOG) 100 UNIT/ML injection Inject 6 Units into the skin 3 (three) times daily before meals.     metFORMIN (GLUCOPHAGE) 1000 MG tablet Take 1,000 mg by mouth 2 (two) times daily with a meal.     metoprolol tartrate (LOPRESSOR) 50 MG tablet Take 1 tablet (50 mg total) by mouth 2 (two) times daily.     mirtazapine (REMERON) 15 MG tablet Take 15 mg by mouth at bedtime.     Multiple Vitamins-Minerals (MULTIVITAMIN WITH MINERALS) tablet Take 1 tablet by mouth daily.     senna (SENOKOT) 8.6 MG TABS tablet Take 1 tablet (8.6 mg total) by mouth 2 (two) times daily.  120 tablet 0   venlafaxine XR (EFFEXOR-XR) 150 MG 24 hr capsule Take 150 mg by mouth daily with breakfast.     acetaminophen (TYLENOL) 325 MG tablet Take 2 tablets (650 mg total) by mouth every 6 (six) hours as needed for mild pain, fever or headache. (Patient taking differently: Take 650 mg by mouth every 6 (six) hours as needed for mild pain, fever or headache (every 4/6 hours as needed). 100F or above)     albuterol (VENTOLIN HFA) 108 (90 Base) MCG/ACT inhaler Inhale 2 puffs into the lungs every 4 (four) hours as needed for wheezing or shortness of breath. 1 each 3   AMINO ACIDS-PROTEIN HYDROLYS PO Take 30 mLs by mouth in the morning, at noon, and at bedtime.     benzocaine  (ANBESOL) 10 % mucosal gel Use as directed 1 application. in the mouth or throat every 2 (two) hours as needed for mouth pain (Gums).     bisacodyl (DULCOLAX) 10 MG suppository Place 1 suppository (10 mg total) rectally daily as needed for moderate constipation or severe constipation. 12 suppository 0   Emollient (EUCERIN) lotion Apply 1 application. topically every 12 (twelve) hours as needed for dry skin (apply to feet and legs for hydration).     fosfomycin (MONUROL) 3 g PACK Take 3 g by mouth every 3 (three) days. for UTI for 3 administration     ipratropium-albuterol (DUONEB) 0.5-2.5 (3) MG/3ML SOLN Take 3 mLs by nebulization every 6 (six) hours as needed. (Patient taking differently: Take 3 mLs by nebulization every 6 (six) hours as needed (SOB or Wheezing).) 360 mL    polyethylene glycol (MIRALAX / GLYCOLAX) 17 g packet Take 17 g by mouth daily. 14 each 0    Results for orders placed or performed during the hospital encounter of 05/29/22 (from the past 48 hour(s))  Glucose, capillary     Status: Abnormal   Collection Time: 05/29/22  7:37 AM  Result Value Ref Range   Glucose-Capillary 259 (H) 70 - 99 mg/dL    Comment: Glucose reference range applies only to samples taken after fasting for at least 8 hours.  Basic metabolic panel per protocol     Status: Abnormal   Collection Time: 05/29/22  8:33 AM  Result Value Ref Range   Sodium 134 (L) 135 - 145 mmol/L   Potassium 3.9 3.5 - 5.1 mmol/L   Chloride 102 98 - 111 mmol/L   CO2 25 22 - 32 mmol/L   Glucose, Bld 335 (H) 70 - 99 mg/dL    Comment: Glucose reference range applies only to samples taken after fasting for at least 8 hours.   BUN 14 6 - 20 mg/dL   Creatinine, Ser 0.62 0.44 - 1.00 mg/dL   Calcium 8.9 8.9 - 10.3 mg/dL   GFR, Estimated >60 >60 mL/min    Comment: (NOTE) Calculated using the CKD-EPI Creatinine Equation (2021)    Anion gap 7 5 - 15    Comment: Performed at [facility] [address], [place], [place]  [address]  CBC per protocol     Status: Abnormal   Collection Time: 05/29/22  8:33 AM  Result Value Ref Range   WBC 10.7 (H) 4.0 - 10.5 K/uL   RBC 4.44 3.87 - 5.11 MIL/uL   Hemoglobin 13.3 12.0 - 15.0 g/dL   HCT 41.1 36.0 - 46.0 %   MCV 92.6 80.0 - 100.0 fL   MCH 30.0 26.0 - 34.0 pg   MCHC  32.4 30.0 - 36.0 g/dL   RDW 13.7 11.5 - 15.5 %   Platelets 248 150 - 400 K/uL   nRBC 0.0 0.0 - 0.2 %    Comment: Performed at [facility], [place] [address] [place], [place] [address]   No results found.  Review of Systems  Musculoskeletal:  Positive for gait problem.  Neurological:  Positive for weakness and headaches.    Blood pressure (!) 126/58, pulse 69, temperature 98.1 F (36.7 C), temperature source Oral, resp. rate 17, height 5\' 6"  (1.676 m), weight 56.7 kg, last menstrual period 01/26/2017, SpO2 96 %. Physical Exam HENT:     Head: Normocephalic.     Right Ear: Tympanic membrane normal.     Nose: Nose normal.     Mouth/Throat:     Mouth: Mucous membranes are moist.  Eyes:     Pupils: Pupils are equal, round, and reactive to light.  Cardiovascular:     Rate and Rhythm: Normal rate.  Pulmonary:     Effort: Pulmonary effort is normal.  Abdominal:     General: Abdomen is flat.  Musculoskeletal:        General: Normal range of motion.  Skin:    General: Skin is warm.  Neurological:     Mental Status: She is alert.     Comments: Patient is awake and alert pupils are equal cranial nerves are intact patient is oriented x3 she has ataxia cognitive difficulties and this has progressed over the last year.      Assessment/Plan 56 year old presents for ventriculoperitoneal shunt placement  Elaina Hoops, MD 05/29/2022, 10:26 AM

## 2022-05-29 NOTE — Anesthesia Postprocedure Evaluation (Signed)
Anesthesia Post Note  Patient: Carolyn Norton  Procedure(s) Performed: Ventriculoperitoneal shunt placement     Patient location during evaluation: PACU Anesthesia Type: General Level of consciousness: awake and alert Pain management: pain level controlled Vital Signs Assessment: post-procedure vital signs reviewed and stable Respiratory status: spontaneous breathing, nonlabored ventilation, respiratory function stable and patient connected to nasal cannula oxygen Cardiovascular status: blood pressure returned to baseline and stable Postop Assessment: no apparent nausea or vomiting Anesthetic complications: no   No notable events documented.  Last Vitals:  Vitals:   05/29/22 1500 05/29/22 1530  BP: 113/63 119/71  Pulse: 84 81  Resp: 16 12  Temp:    SpO2: 95% 94%    Last Pain:  Vitals:   05/29/22 1500  TempSrc:   PainSc: 0-No pain                 Effie Berkshire

## 2022-05-29 NOTE — Transfer of Care (Signed)
Immediate Anesthesia Transfer of Care Note  Patient: Carolyn Norton  Procedure(s) Performed: Ventriculoperitoneal shunt placement  Patient Location: PACU  Anesthesia Type:General  Level of Consciousness: awake and alert   Airway & Oxygen Therapy: Patient Spontanous Breathing  Post-op Assessment: Report given to RN, Post -op Vital signs reviewed and stable and Patient moving all extremities X 4  Post vital signs: Reviewed and stable  Last Vitals:  Vitals Value Taken Time  BP 118/50 05/29/22 1230  Temp 36.2 C 05/29/22 1230  Pulse 80 05/29/22 1230  Resp 13 05/29/22 1230  SpO2 97 % 05/29/22 1230  Vitals shown include unvalidated device data.  Last Pain:  Vitals:   05/29/22 0824  TempSrc:   PainSc: 0-No pain         Complications: No notable events documented.

## 2022-05-29 NOTE — Op Note (Signed)
Preoperative diagnosis: Normal pressure hydrocephalus.  Postoperative diagnosis: Same.  Procedure: Right parietal ventriculoperitoneal shunt placement utilizing the Medtronic strata 2 programmable valve snap shunt assembly.  Surgeon: Kary Kos.  Assistant Nash Shearer.  Anesthesia: General.  EBL: Minimal.  HPI: 56 year old female diagnosed with no pressure hydrocephalus with due to symptoms of declining memory and cognition ataxia incontinence and an improvement of symptoms with high-volume lumbar puncture.  Work-up revealed ventriculomegaly and due to her progression of clinical syndrome imaging findings and failed conservative treatment I recommended ventriculoperitoneal shunt placement.  I extensively reviewed the risks and benefits of the procedure with her as well as perioperative course expectations of outcome and alternatives of surgery and she understands and agrees to proceed forward.  Operative procedure: Patient was brought into the OR was induced under general anesthesia positioned supine with a shoulder bump under her right shoulder the parietal bur hole site behind her right ear was shaved as well as her chest and lateral rectus abdominal wall.  All this was prepped and draped in routine sterile fashion then after timeout both incisions over the right parietal bur hole site and the lateral rectus sheath on the right was drawn out and infiltrated with 10 cc lidocaine with epi incision made first over the bur hole this was dissected 3 bur hole was drilled the dura was coagulated this was then packed with Gelfoam I then incised the abdominal incision identified the fascia of the rectus sheath identified the transversalis fascia and peritoneum clamped with hemostats and incised it identified the intraperitoneal cavity and then utilizing hemostats clamped the margins of the peritoneum packed it with wet sponge and then the shunt passer was passed from the cranial incision to the abdominal  incision I then passed the peritoneal catheter through the sheath and after we passed all that removed the sheath the dura was then incised in a cruciate fashion and a 7 mm tap shunt.  Ventricular catheter was passed easily with clear CSF egress immediately on the first pass the stylette was then removed and the catheter was advanced then I snapped the shunt in place and then we passed the peritoneal distal catheter after confirming good flow from the distal tip of clear CSF passed into the peritoneum and closed the peritoneum with a 3-0 chromic pursestring and then 2-0 Vicryl was used to close the subcutaneous tissue in both the cranial incision abdominal incision subcuticular was put in the abdominal incision staples with the skin of the cranial incision both wounds were dressed patient went recovery in stable condition.  At the end the case all needle count sponge counts were correct.

## 2022-05-29 NOTE — Anesthesia Procedure Notes (Signed)
Procedure Name: Intubation Date/Time: 05/29/2022 10:50 AM  Performed by: Rande Brunt, CRNAPre-anesthesia Checklist: Patient identified, Emergency Drugs available, Suction available and Patient being monitored Patient Re-evaluated:Patient Re-evaluated prior to induction Oxygen Delivery Method: Circle System Utilized Preoxygenation: Pre-oxygenation with 100% oxygen Induction Type: IV induction Ventilation: Mask ventilation without difficulty Laryngoscope Size: Miller and 2 Grade View: Grade I Tube type: Oral Tube size: 7.0 mm Number of attempts: 1 Airway Equipment and Method: Stylet and Oral airway Placement Confirmation: ETT inserted through vocal cords under direct vision, positive ETCO2 and breath sounds checked- equal and bilateral Secured at: 21 cm Tube secured with: Tape Dental Injury: Teeth and Oropharynx as per pre-operative assessment

## 2022-05-29 NOTE — Progress Notes (Signed)
Nasal PCR positive for MRSA and Staph. OR nurse called and Dr. Saintclair Halsted notified.

## 2022-05-30 DIAGNOSIS — G6281 Critical illness polyneuropathy: Secondary | ICD-10-CM | POA: Diagnosis not present

## 2022-05-30 DIAGNOSIS — E1110 Type 2 diabetes mellitus with ketoacidosis without coma: Secondary | ICD-10-CM | POA: Diagnosis not present

## 2022-05-30 DIAGNOSIS — F02B3 Dementia in other diseases classified elsewhere, moderate, with mood disturbance: Secondary | ICD-10-CM | POA: Diagnosis not present

## 2022-05-30 DIAGNOSIS — R4182 Altered mental status, unspecified: Secondary | ICD-10-CM | POA: Diagnosis not present

## 2022-05-30 DIAGNOSIS — G6289 Other specified polyneuropathies: Secondary | ICD-10-CM | POA: Diagnosis not present

## 2022-05-30 DIAGNOSIS — E11649 Type 2 diabetes mellitus with hypoglycemia without coma: Secondary | ICD-10-CM | POA: Diagnosis not present

## 2022-05-30 DIAGNOSIS — H8110 Benign paroxysmal vertigo, unspecified ear: Secondary | ICD-10-CM | POA: Diagnosis not present

## 2022-05-30 DIAGNOSIS — E1169 Type 2 diabetes mellitus with other specified complication: Secondary | ICD-10-CM | POA: Diagnosis not present

## 2022-05-30 DIAGNOSIS — F3132 Bipolar disorder, current episode depressed, moderate: Secondary | ICD-10-CM | POA: Diagnosis not present

## 2022-05-30 DIAGNOSIS — M6281 Muscle weakness (generalized): Secondary | ICD-10-CM | POA: Diagnosis not present

## 2022-05-30 DIAGNOSIS — Z48812 Encounter for surgical aftercare following surgery on the circulatory system: Secondary | ICD-10-CM | POA: Diagnosis not present

## 2022-05-30 DIAGNOSIS — R0981 Nasal congestion: Secondary | ICD-10-CM | POA: Diagnosis not present

## 2022-05-30 DIAGNOSIS — J45909 Unspecified asthma, uncomplicated: Secondary | ICD-10-CM | POA: Diagnosis not present

## 2022-05-30 DIAGNOSIS — N179 Acute kidney failure, unspecified: Secondary | ICD-10-CM | POA: Diagnosis not present

## 2022-05-30 DIAGNOSIS — E1165 Type 2 diabetes mellitus with hyperglycemia: Secondary | ICD-10-CM | POA: Diagnosis not present

## 2022-05-30 DIAGNOSIS — R400 Somnolence: Secondary | ICD-10-CM | POA: Diagnosis not present

## 2022-05-30 DIAGNOSIS — I129 Hypertensive chronic kidney disease with stage 1 through stage 4 chronic kidney disease, or unspecified chronic kidney disease: Secondary | ICD-10-CM | POA: Diagnosis not present

## 2022-05-30 DIAGNOSIS — R600 Localized edema: Secondary | ICD-10-CM | POA: Diagnosis not present

## 2022-05-30 DIAGNOSIS — Z982 Presence of cerebrospinal fluid drainage device: Secondary | ICD-10-CM | POA: Diagnosis not present

## 2022-05-30 DIAGNOSIS — M25531 Pain in right wrist: Secondary | ICD-10-CM | POA: Diagnosis not present

## 2022-05-30 DIAGNOSIS — R062 Wheezing: Secondary | ICD-10-CM | POA: Diagnosis not present

## 2022-05-30 DIAGNOSIS — I10 Essential (primary) hypertension: Secondary | ICD-10-CM | POA: Diagnosis not present

## 2022-05-30 DIAGNOSIS — G300 Alzheimer's disease with early onset: Secondary | ICD-10-CM | POA: Diagnosis not present

## 2022-05-30 DIAGNOSIS — F29 Unspecified psychosis not due to a substance or known physiological condition: Secondary | ICD-10-CM | POA: Diagnosis not present

## 2022-05-30 DIAGNOSIS — F319 Bipolar disorder, unspecified: Secondary | ICD-10-CM | POA: Diagnosis not present

## 2022-05-30 LAB — GLUCOSE, CAPILLARY
Glucose-Capillary: 299 mg/dL — ABNORMAL HIGH (ref 70–99)
Glucose-Capillary: 206 mg/dL — ABNORMAL HIGH (ref 70–99)

## 2022-05-30 MED ORDER — IBUPROFEN 200 MG PO TABS
400.0000 mg | ORAL_TABLET | ORAL | Status: DC | PRN
  Administered 2022-05-30: 400 mg via ORAL
  Filled 2022-05-30: qty 2

## 2022-05-30 NOTE — TOC Transition Note (Signed)
Transition of Care [facility]) - CM/SW Discharge Note   Patient Details  Name: Carolyn Norton MRN: [id number] Date of Birth: 1966-06-21  Transition of Care [facility]) CM/SW Contact:  Bartholomew Crews, RN Phone Number: [phone] 05/30/2022, 2:14 PM   Clinical Narrative:     Patient ready to transition back to [facility]. Spoke with Caryl Pina, RN at [facility]. Report can be called to [place] at [phone]. DC summary faxed to [facility] at [phone]. Copy of DC summary and medical transport place on shadow chart. [facility] arranged for transport. Spoke with patient and father at bedside and spoke with mother on phone to advise of transition back to [facility]. Father to take wheelchair back to [facility] tomorrow, since unable to be transported by [employer]. No further TOC needs identified at this time.   Final next level of care: Long Term Nursing Home Barriers to Discharge: No Barriers Identified   Patient Goals and CMS Choice Patient states their goals for this hospitalization and ongoing recovery are:: return to [email or web address] Compare Post Acute Care list provided to:: Patient Choice offered to / list presented to : NA  Discharge Placement                Patient to be transferred to facility by: [place] Name of family member notified: [name] and Janelle Floor Patient and family notified of of transfer: 05/30/22  Discharge Plan and Services                DME Arranged: N/A DME Agency: NA       HH Arranged: NA HH Agency: NA        Social Determinants of Health ([place]) Interventions     Readmission Risk Interventions    01/14/2021    9:18 AM  Readmission Risk Prevention Plan  Transportation Screening Complete  Social Work Consult for [place] Planning/Counseling Complete  Palliative Care Screening Not Applicable  Medication Review [employer]) Referral to Pharmacy

## 2022-05-30 NOTE — Discharge Summary (Signed)
Physician Discharge Summary  Patient ID: Carolyn Norton MRN: [id number] DOB/AGE: 55-Aug-1967 55 y.o.  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 female with normal pressure hydrocephalus who underwent elective VP shunt placement.  Postoperatively, she was admitted to the floor.  There, she was monitored.  She was tolerating a regular diet with pain well controlled with p.o. pain medications and was deemed ready for discharge back to her nursing home on postop day #1.  Treatments: Surgery -right VP shunt placement  Discharge Exam: Blood pressure (!) 153/69, pulse 79, temperature (!) 97.5 F (36.4 C), temperature source Oral, resp. rate 16, height 5\' 6"  (1.676 m), weight 56.7 kg, last menstrual period 01/26/2017, SpO2 95 %. Awake, alert, oriented to person only Speech fluent but confused, appropriate CN grossly intact 5/5 BUE/BLE Wounds c/d/i  Disposition: Discharge disposition: 03-Skilled Nursing Facility        Allergies as of 05/30/2022       Reactions   Erythromycin Other (See Comments)   Childhood reaction        Medication List     TAKE these medications    acetaminophen 325 MG tablet Commonly known as: TYLENOL Take 2 tablets (650 mg total) by mouth every 6 (six) hours as needed for mild pain, fever or headache. What changed:  reasons to take this additional instructions   albuterol 108 (90 Base) MCG/ACT inhaler Commonly known as: VENTOLIN HFA Inhale 2 puffs into the lungs every 4 (four) hours as needed for wheezing or shortness of breath.   AMINO ACIDS-PROTEIN HYDROLYS PO Take 30 mLs by mouth in the morning, at  noon, and at bedtime.   amLODipine 5 MG tablet Commonly known as: NORVASC Take 1 tablet (5 mg total) by mouth daily.   Anbesol 10 % mucosal gel Generic drug: benzocaine Use as directed 1 application. in the mouth or throat every 2 (two) hours as needed for mouth pain (Gums).   atorvastatin 20 MG tablet Commonly known as: LIPITOR Take 1 tablet (20 mg total) by mouth daily. What changed: when to take this   bisacodyl 10 MG suppository Commonly known as: DULCOLAX Place 1 suppository (10 mg total) rectally daily as needed for moderate constipation or severe constipation.   divalproex 250 MG DR tablet Commonly known as: DEPAKOTE Take 250 mg by mouth 2 (two) times daily.   eucerin lotion Apply 1 application. topically every 12 (twelve) hours as needed for dry skin (apply to feet and legs for hydration).   fosfomycin 3 g Pack Commonly known as: MONUROL Take 3 g by mouth every 3 (three) days. for UTI for 3 administration   insulin detemir 100 UNIT/ML injection Commonly known as: LEVEMIR Inject 10 Units into the skin 2 (two) times daily.   insulin lispro 100 UNIT/ML injection Commonly known as: HUMALOG Inject 6 Units into the skin 3 (three) times daily before meals.   ipratropium-albuterol 0.5-2.5 (3) MG/3ML Soln Commonly known as: DUONEB Take 3 mLs by nebulization every 6 (six) hours as needed. What changed: reasons to take this   metFORMIN 1000 MG tablet Commonly known as: GLUCOPHAGE Take 1,000 mg by mouth 2 (two) times daily with a meal.   metoprolol tartrate 50 MG tablet Commonly  known as: LOPRESSOR Take 1 tablet (50 mg total) by mouth 2 (two) times daily.   mirtazapine 15 MG tablet Commonly known as: REMERON Take 15 mg by mouth at bedtime.   multivitamin with minerals tablet Take 1 tablet by mouth daily.   polyethylene glycol 17 g packet Commonly known as: MIRALAX / GLYCOLAX Take 17 g by mouth daily.   senna 8.6 MG Tabs tablet Commonly known as:  SENOKOT Take 1 tablet (8.6 mg total) by mouth 2 (two) times daily.   Trulicity 4.12 IN/8.6VE Sopn Generic drug: Dulaglutide Inject 0.75 mg into the skin once a week. Every Thursday   venlafaxine XR 150 MG 24 hr capsule Commonly known as: EFFEXOR-XR Take 150 mg by mouth daily with breakfast.        Follow-up Information     Kary Kos, MD Follow up.   Specialty: Neurosurgery Contact information: [address] [place] [address] [place] [address] [phone]                 Signed: Vallarie Mare 05/30/2022, 10:54 AM

## 2022-05-30 NOTE — Discharge Instructions (Signed)
Remove head and abdominal dressings on 9/25.  Okay to shower on 9/25 No lifting greater than 15 pounds

## 2022-06-01 ENCOUNTER — Encounter (HOSPITAL_COMMUNITY): Payer: Self-pay | Admitting: Neurosurgery

## 2022-06-01 DIAGNOSIS — R4189 Other symptoms and signs involving cognitive functions and awareness: Secondary | ICD-10-CM | POA: Diagnosis not present

## 2022-06-01 DIAGNOSIS — R52 Pain, unspecified: Secondary | ICD-10-CM | POA: Diagnosis not present

## 2022-06-01 DIAGNOSIS — G912 (Idiopathic) normal pressure hydrocephalus: Secondary | ICD-10-CM | POA: Diagnosis not present

## 2022-06-01 DIAGNOSIS — Z982 Presence of cerebrospinal fluid drainage device: Secondary | ICD-10-CM | POA: Diagnosis not present

## 2022-06-01 MED FILL — Thrombin For Soln 5000 Unit: CUTANEOUS | Qty: 2 | Status: AC

## 2022-06-03 DIAGNOSIS — I129 Hypertensive chronic kidney disease with stage 1 through stage 4 chronic kidney disease, or unspecified chronic kidney disease: Secondary | ICD-10-CM | POA: Diagnosis not present

## 2022-06-03 DIAGNOSIS — G6289 Other specified polyneuropathies: Secondary | ICD-10-CM | POA: Diagnosis not present

## 2022-06-03 DIAGNOSIS — R400 Somnolence: Secondary | ICD-10-CM | POA: Diagnosis not present

## 2022-06-03 DIAGNOSIS — M6281 Muscle weakness (generalized): Secondary | ICD-10-CM | POA: Diagnosis not present

## 2022-06-03 DIAGNOSIS — E1165 Type 2 diabetes mellitus with hyperglycemia: Secondary | ICD-10-CM | POA: Diagnosis not present

## 2022-06-03 DIAGNOSIS — E11649 Type 2 diabetes mellitus with hypoglycemia without coma: Secondary | ICD-10-CM | POA: Diagnosis not present

## 2022-06-03 DIAGNOSIS — R062 Wheezing: Secondary | ICD-10-CM | POA: Diagnosis not present

## 2022-06-03 DIAGNOSIS — E1110 Type 2 diabetes mellitus with ketoacidosis without coma: Secondary | ICD-10-CM | POA: Diagnosis not present

## 2022-06-03 DIAGNOSIS — R0981 Nasal congestion: Secondary | ICD-10-CM | POA: Diagnosis not present

## 2022-06-03 DIAGNOSIS — H8110 Benign paroxysmal vertigo, unspecified ear: Secondary | ICD-10-CM | POA: Diagnosis not present

## 2022-06-03 DIAGNOSIS — Z982 Presence of cerebrospinal fluid drainage device: Secondary | ICD-10-CM | POA: Diagnosis not present

## 2022-06-03 DIAGNOSIS — J45909 Unspecified asthma, uncomplicated: Secondary | ICD-10-CM | POA: Diagnosis not present

## 2022-06-03 DIAGNOSIS — Z48812 Encounter for surgical aftercare following surgery on the circulatory system: Secondary | ICD-10-CM | POA: Diagnosis not present

## 2022-06-03 DIAGNOSIS — N179 Acute kidney failure, unspecified: Secondary | ICD-10-CM | POA: Diagnosis not present

## 2022-06-03 DIAGNOSIS — F02B3 Dementia in other diseases classified elsewhere, moderate, with mood disturbance: Secondary | ICD-10-CM | POA: Diagnosis not present

## 2022-06-03 DIAGNOSIS — I10 Essential (primary) hypertension: Secondary | ICD-10-CM | POA: Diagnosis not present

## 2022-06-03 DIAGNOSIS — R600 Localized edema: Secondary | ICD-10-CM | POA: Diagnosis not present

## 2022-06-03 DIAGNOSIS — F29 Unspecified psychosis not due to a substance or known physiological condition: Secondary | ICD-10-CM | POA: Diagnosis not present

## 2022-06-03 DIAGNOSIS — E1169 Type 2 diabetes mellitus with other specified complication: Secondary | ICD-10-CM | POA: Diagnosis not present

## 2022-06-03 DIAGNOSIS — F319 Bipolar disorder, unspecified: Secondary | ICD-10-CM | POA: Diagnosis not present

## 2022-06-03 DIAGNOSIS — G300 Alzheimer's disease with early onset: Secondary | ICD-10-CM | POA: Diagnosis not present

## 2022-06-03 DIAGNOSIS — M25531 Pain in right wrist: Secondary | ICD-10-CM | POA: Diagnosis not present

## 2022-06-03 DIAGNOSIS — R4182 Altered mental status, unspecified: Secondary | ICD-10-CM | POA: Diagnosis not present

## 2022-06-03 DIAGNOSIS — F3132 Bipolar disorder, current episode depressed, moderate: Secondary | ICD-10-CM | POA: Diagnosis not present

## 2022-06-03 DIAGNOSIS — G6281 Critical illness polyneuropathy: Secondary | ICD-10-CM | POA: Diagnosis not present

## 2022-06-07 DIAGNOSIS — I129 Hypertensive chronic kidney disease with stage 1 through stage 4 chronic kidney disease, or unspecified chronic kidney disease: Secondary | ICD-10-CM | POA: Diagnosis not present

## 2022-06-07 DIAGNOSIS — E1169 Type 2 diabetes mellitus with other specified complication: Secondary | ICD-10-CM | POA: Diagnosis not present

## 2022-06-07 DIAGNOSIS — R400 Somnolence: Secondary | ICD-10-CM | POA: Diagnosis not present

## 2022-06-07 DIAGNOSIS — M6281 Muscle weakness (generalized): Secondary | ICD-10-CM | POA: Diagnosis not present

## 2022-06-07 DIAGNOSIS — E1110 Type 2 diabetes mellitus with ketoacidosis without coma: Secondary | ICD-10-CM | POA: Diagnosis not present

## 2022-06-07 DIAGNOSIS — E11649 Type 2 diabetes mellitus with hypoglycemia without coma: Secondary | ICD-10-CM | POA: Diagnosis not present

## 2022-06-07 DIAGNOSIS — G300 Alzheimer's disease with early onset: Secondary | ICD-10-CM | POA: Diagnosis not present

## 2022-06-07 DIAGNOSIS — N179 Acute kidney failure, unspecified: Secondary | ICD-10-CM | POA: Diagnosis not present

## 2022-06-07 DIAGNOSIS — Z982 Presence of cerebrospinal fluid drainage device: Secondary | ICD-10-CM | POA: Diagnosis not present

## 2022-06-07 DIAGNOSIS — G6281 Critical illness polyneuropathy: Secondary | ICD-10-CM | POA: Diagnosis not present

## 2022-06-07 DIAGNOSIS — F02B3 Dementia in other diseases classified elsewhere, moderate, with mood disturbance: Secondary | ICD-10-CM | POA: Diagnosis not present

## 2022-06-07 DIAGNOSIS — G6289 Other specified polyneuropathies: Secondary | ICD-10-CM | POA: Diagnosis not present

## 2022-06-07 DIAGNOSIS — F29 Unspecified psychosis not due to a substance or known physiological condition: Secondary | ICD-10-CM | POA: Diagnosis not present

## 2022-06-07 DIAGNOSIS — H8110 Benign paroxysmal vertigo, unspecified ear: Secondary | ICD-10-CM | POA: Diagnosis not present

## 2022-06-07 DIAGNOSIS — R0981 Nasal congestion: Secondary | ICD-10-CM | POA: Diagnosis not present

## 2022-06-07 DIAGNOSIS — E1165 Type 2 diabetes mellitus with hyperglycemia: Secondary | ICD-10-CM | POA: Diagnosis not present

## 2022-06-07 DIAGNOSIS — F319 Bipolar disorder, unspecified: Secondary | ICD-10-CM | POA: Diagnosis not present

## 2022-06-07 DIAGNOSIS — M25531 Pain in right wrist: Secondary | ICD-10-CM | POA: Diagnosis not present

## 2022-06-07 DIAGNOSIS — Z48812 Encounter for surgical aftercare following surgery on the circulatory system: Secondary | ICD-10-CM | POA: Diagnosis not present

## 2022-06-07 DIAGNOSIS — R4182 Altered mental status, unspecified: Secondary | ICD-10-CM | POA: Diagnosis not present

## 2022-06-07 DIAGNOSIS — R600 Localized edema: Secondary | ICD-10-CM | POA: Diagnosis not present

## 2022-06-07 DIAGNOSIS — I10 Essential (primary) hypertension: Secondary | ICD-10-CM | POA: Diagnosis not present

## 2022-06-07 DIAGNOSIS — J45909 Unspecified asthma, uncomplicated: Secondary | ICD-10-CM | POA: Diagnosis not present

## 2022-06-07 DIAGNOSIS — R062 Wheezing: Secondary | ICD-10-CM | POA: Diagnosis not present

## 2022-06-07 DIAGNOSIS — F3132 Bipolar disorder, current episode depressed, moderate: Secondary | ICD-10-CM | POA: Diagnosis not present

## 2022-06-16 ENCOUNTER — Other Ambulatory Visit: Payer: Self-pay | Admitting: Student

## 2022-06-16 ENCOUNTER — Other Ambulatory Visit (HOSPITAL_COMMUNITY): Payer: Self-pay | Admitting: Student

## 2022-06-16 DIAGNOSIS — Z043 Encounter for examination and observation following other accident: Secondary | ICD-10-CM | POA: Diagnosis not present

## 2022-06-16 DIAGNOSIS — Z79899 Other long term (current) drug therapy: Secondary | ICD-10-CM | POA: Diagnosis not present

## 2022-06-16 DIAGNOSIS — R52 Pain, unspecified: Secondary | ICD-10-CM | POA: Diagnosis not present

## 2022-06-16 DIAGNOSIS — G912 (Idiopathic) normal pressure hydrocephalus: Secondary | ICD-10-CM

## 2022-06-17 DIAGNOSIS — E1165 Type 2 diabetes mellitus with hyperglycemia: Secondary | ICD-10-CM | POA: Diagnosis not present

## 2022-06-17 DIAGNOSIS — N179 Acute kidney failure, unspecified: Secondary | ICD-10-CM | POA: Diagnosis not present

## 2022-06-17 DIAGNOSIS — R062 Wheezing: Secondary | ICD-10-CM | POA: Diagnosis not present

## 2022-06-17 DIAGNOSIS — E1169 Type 2 diabetes mellitus with other specified complication: Secondary | ICD-10-CM | POA: Diagnosis not present

## 2022-06-17 DIAGNOSIS — J449 Chronic obstructive pulmonary disease, unspecified: Secondary | ICD-10-CM | POA: Diagnosis not present

## 2022-06-17 DIAGNOSIS — Z79899 Other long term (current) drug therapy: Secondary | ICD-10-CM | POA: Diagnosis not present

## 2022-06-17 DIAGNOSIS — J45909 Unspecified asthma, uncomplicated: Secondary | ICD-10-CM | POA: Diagnosis not present

## 2022-06-17 DIAGNOSIS — R400 Somnolence: Secondary | ICD-10-CM | POA: Diagnosis not present

## 2022-06-17 DIAGNOSIS — Z48812 Encounter for surgical aftercare following surgery on the circulatory system: Secondary | ICD-10-CM | POA: Diagnosis not present

## 2022-06-17 DIAGNOSIS — M6281 Muscle weakness (generalized): Secondary | ICD-10-CM | POA: Diagnosis not present

## 2022-06-17 DIAGNOSIS — I129 Hypertensive chronic kidney disease with stage 1 through stage 4 chronic kidney disease, or unspecified chronic kidney disease: Secondary | ICD-10-CM | POA: Diagnosis not present

## 2022-06-17 DIAGNOSIS — R0981 Nasal congestion: Secondary | ICD-10-CM | POA: Diagnosis not present

## 2022-06-17 DIAGNOSIS — E11649 Type 2 diabetes mellitus with hypoglycemia without coma: Secondary | ICD-10-CM | POA: Diagnosis not present

## 2022-06-17 DIAGNOSIS — F319 Bipolar disorder, unspecified: Secondary | ICD-10-CM | POA: Diagnosis not present

## 2022-06-17 DIAGNOSIS — G6281 Critical illness polyneuropathy: Secondary | ICD-10-CM | POA: Diagnosis not present

## 2022-06-17 DIAGNOSIS — F02C0 Dementia in other diseases classified elsewhere, severe, without behavioral disturbance, psychotic disturbance, mood disturbance, and anxiety: Secondary | ICD-10-CM | POA: Diagnosis not present

## 2022-06-17 DIAGNOSIS — R4182 Altered mental status, unspecified: Secondary | ICD-10-CM | POA: Diagnosis not present

## 2022-06-17 DIAGNOSIS — F3132 Bipolar disorder, current episode depressed, moderate: Secondary | ICD-10-CM | POA: Diagnosis not present

## 2022-06-17 DIAGNOSIS — F02B3 Dementia in other diseases classified elsewhere, moderate, with mood disturbance: Secondary | ICD-10-CM | POA: Diagnosis not present

## 2022-06-17 DIAGNOSIS — G300 Alzheimer's disease with early onset: Secondary | ICD-10-CM | POA: Diagnosis not present

## 2022-06-17 DIAGNOSIS — I10 Essential (primary) hypertension: Secondary | ICD-10-CM | POA: Diagnosis not present

## 2022-06-17 DIAGNOSIS — R52 Pain, unspecified: Secondary | ICD-10-CM | POA: Diagnosis not present

## 2022-06-17 DIAGNOSIS — Z982 Presence of cerebrospinal fluid drainage device: Secondary | ICD-10-CM | POA: Diagnosis not present

## 2022-06-17 DIAGNOSIS — G6289 Other specified polyneuropathies: Secondary | ICD-10-CM | POA: Diagnosis not present

## 2022-06-17 DIAGNOSIS — M25531 Pain in right wrist: Secondary | ICD-10-CM | POA: Diagnosis not present

## 2022-06-17 DIAGNOSIS — F29 Unspecified psychosis not due to a substance or known physiological condition: Secondary | ICD-10-CM | POA: Diagnosis not present

## 2022-06-17 DIAGNOSIS — R4189 Other symptoms and signs involving cognitive functions and awareness: Secondary | ICD-10-CM | POA: Diagnosis not present

## 2022-06-17 DIAGNOSIS — E1110 Type 2 diabetes mellitus with ketoacidosis without coma: Secondary | ICD-10-CM | POA: Diagnosis not present

## 2022-06-24 ENCOUNTER — Ambulatory Visit: Payer: Medicaid Other | Admitting: Neurology

## 2022-06-24 DIAGNOSIS — F319 Bipolar disorder, unspecified: Secondary | ICD-10-CM | POA: Diagnosis not present

## 2022-06-24 DIAGNOSIS — R600 Localized edema: Secondary | ICD-10-CM | POA: Diagnosis not present

## 2022-06-24 DIAGNOSIS — M6281 Muscle weakness (generalized): Secondary | ICD-10-CM | POA: Diagnosis not present

## 2022-06-24 DIAGNOSIS — I10 Essential (primary) hypertension: Secondary | ICD-10-CM | POA: Diagnosis not present

## 2022-06-24 DIAGNOSIS — F3132 Bipolar disorder, current episode depressed, moderate: Secondary | ICD-10-CM | POA: Diagnosis not present

## 2022-06-24 DIAGNOSIS — E1165 Type 2 diabetes mellitus with hyperglycemia: Secondary | ICD-10-CM | POA: Diagnosis not present

## 2022-06-24 DIAGNOSIS — F29 Unspecified psychosis not due to a substance or known physiological condition: Secondary | ICD-10-CM | POA: Diagnosis not present

## 2022-06-24 DIAGNOSIS — G6289 Other specified polyneuropathies: Secondary | ICD-10-CM | POA: Diagnosis not present

## 2022-06-24 DIAGNOSIS — E11649 Type 2 diabetes mellitus with hypoglycemia without coma: Secondary | ICD-10-CM | POA: Diagnosis not present

## 2022-06-24 DIAGNOSIS — N179 Acute kidney failure, unspecified: Secondary | ICD-10-CM | POA: Diagnosis not present

## 2022-06-24 DIAGNOSIS — Z982 Presence of cerebrospinal fluid drainage device: Secondary | ICD-10-CM | POA: Diagnosis not present

## 2022-06-24 DIAGNOSIS — R0981 Nasal congestion: Secondary | ICD-10-CM | POA: Diagnosis not present

## 2022-06-24 DIAGNOSIS — R400 Somnolence: Secondary | ICD-10-CM | POA: Diagnosis not present

## 2022-06-24 DIAGNOSIS — R062 Wheezing: Secondary | ICD-10-CM | POA: Diagnosis not present

## 2022-06-24 DIAGNOSIS — F02B3 Dementia in other diseases classified elsewhere, moderate, with mood disturbance: Secondary | ICD-10-CM | POA: Diagnosis not present

## 2022-06-24 DIAGNOSIS — E1110 Type 2 diabetes mellitus with ketoacidosis without coma: Secondary | ICD-10-CM | POA: Diagnosis not present

## 2022-06-24 DIAGNOSIS — G300 Alzheimer's disease with early onset: Secondary | ICD-10-CM | POA: Diagnosis not present

## 2022-06-24 DIAGNOSIS — G6281 Critical illness polyneuropathy: Secondary | ICD-10-CM | POA: Diagnosis not present

## 2022-06-24 DIAGNOSIS — Z48812 Encounter for surgical aftercare following surgery on the circulatory system: Secondary | ICD-10-CM | POA: Diagnosis not present

## 2022-06-24 DIAGNOSIS — F02C0 Dementia in other diseases classified elsewhere, severe, without behavioral disturbance, psychotic disturbance, mood disturbance, and anxiety: Secondary | ICD-10-CM | POA: Diagnosis not present

## 2022-06-24 DIAGNOSIS — E1169 Type 2 diabetes mellitus with other specified complication: Secondary | ICD-10-CM | POA: Diagnosis not present

## 2022-06-24 DIAGNOSIS — H8110 Benign paroxysmal vertigo, unspecified ear: Secondary | ICD-10-CM | POA: Diagnosis not present

## 2022-06-24 DIAGNOSIS — R4182 Altered mental status, unspecified: Secondary | ICD-10-CM | POA: Diagnosis not present

## 2022-06-24 DIAGNOSIS — M25531 Pain in right wrist: Secondary | ICD-10-CM | POA: Diagnosis not present

## 2022-06-24 DIAGNOSIS — J45909 Unspecified asthma, uncomplicated: Secondary | ICD-10-CM | POA: Diagnosis not present

## 2022-06-30 DIAGNOSIS — Z79899 Other long term (current) drug therapy: Secondary | ICD-10-CM | POA: Diagnosis not present

## 2022-06-30 DIAGNOSIS — U071 COVID-19: Secondary | ICD-10-CM | POA: Diagnosis not present

## 2022-06-30 DIAGNOSIS — R0981 Nasal congestion: Secondary | ICD-10-CM | POA: Diagnosis not present

## 2022-06-30 DIAGNOSIS — R058 Other specified cough: Secondary | ICD-10-CM | POA: Diagnosis not present

## 2022-07-01 DIAGNOSIS — G6289 Other specified polyneuropathies: Secondary | ICD-10-CM | POA: Diagnosis not present

## 2022-07-01 DIAGNOSIS — E1110 Type 2 diabetes mellitus with ketoacidosis without coma: Secondary | ICD-10-CM | POA: Diagnosis not present

## 2022-07-01 DIAGNOSIS — I10 Essential (primary) hypertension: Secondary | ICD-10-CM | POA: Diagnosis not present

## 2022-07-01 DIAGNOSIS — Z982 Presence of cerebrospinal fluid drainage device: Secondary | ICD-10-CM | POA: Diagnosis not present

## 2022-07-01 DIAGNOSIS — Z48812 Encounter for surgical aftercare following surgery on the circulatory system: Secondary | ICD-10-CM | POA: Diagnosis not present

## 2022-07-01 DIAGNOSIS — F29 Unspecified psychosis not due to a substance or known physiological condition: Secondary | ICD-10-CM | POA: Diagnosis not present

## 2022-07-01 DIAGNOSIS — R4182 Altered mental status, unspecified: Secondary | ICD-10-CM | POA: Diagnosis not present

## 2022-07-01 DIAGNOSIS — F02C0 Dementia in other diseases classified elsewhere, severe, without behavioral disturbance, psychotic disturbance, mood disturbance, and anxiety: Secondary | ICD-10-CM | POA: Diagnosis not present

## 2022-07-01 DIAGNOSIS — G300 Alzheimer's disease with early onset: Secondary | ICD-10-CM | POA: Diagnosis not present

## 2022-07-01 DIAGNOSIS — R400 Somnolence: Secondary | ICD-10-CM | POA: Diagnosis not present

## 2022-07-01 DIAGNOSIS — H8110 Benign paroxysmal vertigo, unspecified ear: Secondary | ICD-10-CM | POA: Diagnosis not present

## 2022-07-01 DIAGNOSIS — N179 Acute kidney failure, unspecified: Secondary | ICD-10-CM | POA: Diagnosis not present

## 2022-07-01 DIAGNOSIS — R062 Wheezing: Secondary | ICD-10-CM | POA: Diagnosis not present

## 2022-07-01 DIAGNOSIS — R0981 Nasal congestion: Secondary | ICD-10-CM | POA: Diagnosis not present

## 2022-07-01 DIAGNOSIS — E1169 Type 2 diabetes mellitus with other specified complication: Secondary | ICD-10-CM | POA: Diagnosis not present

## 2022-07-01 DIAGNOSIS — E1165 Type 2 diabetes mellitus with hyperglycemia: Secondary | ICD-10-CM | POA: Diagnosis not present

## 2022-07-01 DIAGNOSIS — F319 Bipolar disorder, unspecified: Secondary | ICD-10-CM | POA: Diagnosis not present

## 2022-07-01 DIAGNOSIS — G6281 Critical illness polyneuropathy: Secondary | ICD-10-CM | POA: Diagnosis not present

## 2022-07-01 DIAGNOSIS — R600 Localized edema: Secondary | ICD-10-CM | POA: Diagnosis not present

## 2022-07-01 DIAGNOSIS — E11649 Type 2 diabetes mellitus with hypoglycemia without coma: Secondary | ICD-10-CM | POA: Diagnosis not present

## 2022-07-01 DIAGNOSIS — M6281 Muscle weakness (generalized): Secondary | ICD-10-CM | POA: Diagnosis not present

## 2022-07-01 DIAGNOSIS — F02B3 Dementia in other diseases classified elsewhere, moderate, with mood disturbance: Secondary | ICD-10-CM | POA: Diagnosis not present

## 2022-07-01 DIAGNOSIS — J45909 Unspecified asthma, uncomplicated: Secondary | ICD-10-CM | POA: Diagnosis not present

## 2022-07-01 DIAGNOSIS — F3132 Bipolar disorder, current episode depressed, moderate: Secondary | ICD-10-CM | POA: Diagnosis not present

## 2022-07-01 DIAGNOSIS — M25531 Pain in right wrist: Secondary | ICD-10-CM | POA: Diagnosis not present

## 2022-07-03 ENCOUNTER — Encounter (HOSPITAL_COMMUNITY): Payer: Self-pay

## 2022-07-03 ENCOUNTER — Ambulatory Visit (HOSPITAL_COMMUNITY): Payer: Medicaid Other

## 2022-07-07 DIAGNOSIS — U071 COVID-19: Secondary | ICD-10-CM | POA: Diagnosis not present

## 2022-07-07 DIAGNOSIS — Z79899 Other long term (current) drug therapy: Secondary | ICD-10-CM | POA: Diagnosis not present

## 2022-07-07 DIAGNOSIS — M6281 Muscle weakness (generalized): Secondary | ICD-10-CM | POA: Diagnosis not present

## 2022-07-08 DIAGNOSIS — N179 Acute kidney failure, unspecified: Secondary | ICD-10-CM | POA: Diagnosis not present

## 2022-07-08 DIAGNOSIS — E11649 Type 2 diabetes mellitus with hypoglycemia without coma: Secondary | ICD-10-CM | POA: Diagnosis not present

## 2022-07-08 DIAGNOSIS — M6281 Muscle weakness (generalized): Secondary | ICD-10-CM | POA: Diagnosis not present

## 2022-07-08 DIAGNOSIS — R400 Somnolence: Secondary | ICD-10-CM | POA: Diagnosis not present

## 2022-07-08 DIAGNOSIS — R062 Wheezing: Secondary | ICD-10-CM | POA: Diagnosis not present

## 2022-07-08 DIAGNOSIS — F02C0 Dementia in other diseases classified elsewhere, severe, without behavioral disturbance, psychotic disturbance, mood disturbance, and anxiety: Secondary | ICD-10-CM | POA: Diagnosis not present

## 2022-07-08 DIAGNOSIS — E1165 Type 2 diabetes mellitus with hyperglycemia: Secondary | ICD-10-CM | POA: Diagnosis not present

## 2022-07-08 DIAGNOSIS — G6289 Other specified polyneuropathies: Secondary | ICD-10-CM | POA: Diagnosis not present

## 2022-07-08 DIAGNOSIS — G300 Alzheimer's disease with early onset: Secondary | ICD-10-CM | POA: Diagnosis not present

## 2022-07-08 DIAGNOSIS — E1110 Type 2 diabetes mellitus with ketoacidosis without coma: Secondary | ICD-10-CM | POA: Diagnosis not present

## 2022-07-08 DIAGNOSIS — E1169 Type 2 diabetes mellitus with other specified complication: Secondary | ICD-10-CM | POA: Diagnosis not present

## 2022-07-08 DIAGNOSIS — R0981 Nasal congestion: Secondary | ICD-10-CM | POA: Diagnosis not present

## 2022-07-08 DIAGNOSIS — F319 Bipolar disorder, unspecified: Secondary | ICD-10-CM | POA: Diagnosis not present

## 2022-07-08 DIAGNOSIS — R4182 Altered mental status, unspecified: Secondary | ICD-10-CM | POA: Diagnosis not present

## 2022-07-08 DIAGNOSIS — F3132 Bipolar disorder, current episode depressed, moderate: Secondary | ICD-10-CM | POA: Diagnosis not present

## 2022-07-08 DIAGNOSIS — J45909 Unspecified asthma, uncomplicated: Secondary | ICD-10-CM | POA: Diagnosis not present

## 2022-07-08 DIAGNOSIS — H8110 Benign paroxysmal vertigo, unspecified ear: Secondary | ICD-10-CM | POA: Diagnosis not present

## 2022-07-08 DIAGNOSIS — R600 Localized edema: Secondary | ICD-10-CM | POA: Diagnosis not present

## 2022-07-08 DIAGNOSIS — G6281 Critical illness polyneuropathy: Secondary | ICD-10-CM | POA: Diagnosis not present

## 2022-07-08 DIAGNOSIS — M25531 Pain in right wrist: Secondary | ICD-10-CM | POA: Diagnosis not present

## 2022-07-08 DIAGNOSIS — Z982 Presence of cerebrospinal fluid drainage device: Secondary | ICD-10-CM | POA: Diagnosis not present

## 2022-07-08 DIAGNOSIS — F02B3 Dementia in other diseases classified elsewhere, moderate, with mood disturbance: Secondary | ICD-10-CM | POA: Diagnosis not present

## 2022-07-08 DIAGNOSIS — F29 Unspecified psychosis not due to a substance or known physiological condition: Secondary | ICD-10-CM | POA: Diagnosis not present

## 2022-07-08 DIAGNOSIS — Z48812 Encounter for surgical aftercare following surgery on the circulatory system: Secondary | ICD-10-CM | POA: Diagnosis not present

## 2022-07-08 DIAGNOSIS — I10 Essential (primary) hypertension: Secondary | ICD-10-CM | POA: Diagnosis not present

## 2022-07-09 DIAGNOSIS — E119 Type 2 diabetes mellitus without complications: Secondary | ICD-10-CM | POA: Diagnosis not present

## 2022-07-10 ENCOUNTER — Ambulatory Visit (HOSPITAL_COMMUNITY)
Admission: RE | Admit: 2022-07-10 | Discharge: 2022-07-10 | Disposition: A | Discharge status: Home / Self Care | Payer: Medicaid Other | Source: Ambulatory Visit | Attending: Student | Admitting: Student

## 2022-07-10 DIAGNOSIS — G912 (Idiopathic) normal pressure hydrocephalus: Secondary | ICD-10-CM | POA: Diagnosis not present

## 2022-07-10 DIAGNOSIS — Z982 Presence of cerebrospinal fluid drainage device: Secondary | ICD-10-CM | POA: Diagnosis not present

## 2022-07-10 DIAGNOSIS — I672 Cerebral atherosclerosis: Secondary | ICD-10-CM | POA: Diagnosis not present

## 2022-07-10 DIAGNOSIS — D181 Lymphangioma, any site: Secondary | ICD-10-CM | POA: Diagnosis not present

## 2022-07-15 DIAGNOSIS — I10 Essential (primary) hypertension: Secondary | ICD-10-CM | POA: Diagnosis not present

## 2022-07-15 DIAGNOSIS — G300 Alzheimer's disease with early onset: Secondary | ICD-10-CM | POA: Diagnosis not present

## 2022-07-15 DIAGNOSIS — G6281 Critical illness polyneuropathy: Secondary | ICD-10-CM | POA: Diagnosis not present

## 2022-07-15 DIAGNOSIS — M6281 Muscle weakness (generalized): Secondary | ICD-10-CM | POA: Diagnosis not present

## 2022-07-15 DIAGNOSIS — J45909 Unspecified asthma, uncomplicated: Secondary | ICD-10-CM | POA: Diagnosis not present

## 2022-07-15 DIAGNOSIS — E1165 Type 2 diabetes mellitus with hyperglycemia: Secondary | ICD-10-CM | POA: Diagnosis not present

## 2022-07-15 DIAGNOSIS — N179 Acute kidney failure, unspecified: Secondary | ICD-10-CM | POA: Diagnosis not present

## 2022-07-15 DIAGNOSIS — E1169 Type 2 diabetes mellitus with other specified complication: Secondary | ICD-10-CM | POA: Diagnosis not present

## 2022-07-15 DIAGNOSIS — R4182 Altered mental status, unspecified: Secondary | ICD-10-CM | POA: Diagnosis not present

## 2022-07-15 DIAGNOSIS — M25531 Pain in right wrist: Secondary | ICD-10-CM | POA: Diagnosis not present

## 2022-07-15 DIAGNOSIS — F3132 Bipolar disorder, current episode depressed, moderate: Secondary | ICD-10-CM | POA: Diagnosis not present

## 2022-07-15 DIAGNOSIS — R400 Somnolence: Secondary | ICD-10-CM | POA: Diagnosis not present

## 2022-07-15 DIAGNOSIS — E11649 Type 2 diabetes mellitus with hypoglycemia without coma: Secondary | ICD-10-CM | POA: Diagnosis not present

## 2022-07-15 DIAGNOSIS — H8110 Benign paroxysmal vertigo, unspecified ear: Secondary | ICD-10-CM | POA: Diagnosis not present

## 2022-07-15 DIAGNOSIS — R600 Localized edema: Secondary | ICD-10-CM | POA: Diagnosis not present

## 2022-07-15 DIAGNOSIS — R0981 Nasal congestion: Secondary | ICD-10-CM | POA: Diagnosis not present

## 2022-07-15 DIAGNOSIS — G6289 Other specified polyneuropathies: Secondary | ICD-10-CM | POA: Diagnosis not present

## 2022-07-15 DIAGNOSIS — F29 Unspecified psychosis not due to a substance or known physiological condition: Secondary | ICD-10-CM | POA: Diagnosis not present

## 2022-07-15 DIAGNOSIS — F319 Bipolar disorder, unspecified: Secondary | ICD-10-CM | POA: Diagnosis not present

## 2022-07-15 DIAGNOSIS — F02C0 Dementia in other diseases classified elsewhere, severe, without behavioral disturbance, psychotic disturbance, mood disturbance, and anxiety: Secondary | ICD-10-CM | POA: Diagnosis not present

## 2022-07-15 DIAGNOSIS — R062 Wheezing: Secondary | ICD-10-CM | POA: Diagnosis not present

## 2022-07-15 DIAGNOSIS — Z48812 Encounter for surgical aftercare following surgery on the circulatory system: Secondary | ICD-10-CM | POA: Diagnosis not present

## 2022-07-15 DIAGNOSIS — Z982 Presence of cerebrospinal fluid drainage device: Secondary | ICD-10-CM | POA: Diagnosis not present

## 2022-07-15 DIAGNOSIS — E1110 Type 2 diabetes mellitus with ketoacidosis without coma: Secondary | ICD-10-CM | POA: Diagnosis not present

## 2022-07-15 DIAGNOSIS — F02B3 Dementia in other diseases classified elsewhere, moderate, with mood disturbance: Secondary | ICD-10-CM | POA: Diagnosis not present

## 2022-07-22 DIAGNOSIS — G6289 Other specified polyneuropathies: Secondary | ICD-10-CM | POA: Diagnosis not present

## 2022-07-22 DIAGNOSIS — R600 Localized edema: Secondary | ICD-10-CM | POA: Diagnosis not present

## 2022-07-22 DIAGNOSIS — E11649 Type 2 diabetes mellitus with hypoglycemia without coma: Secondary | ICD-10-CM | POA: Diagnosis not present

## 2022-07-22 DIAGNOSIS — F29 Unspecified psychosis not due to a substance or known physiological condition: Secondary | ICD-10-CM | POA: Diagnosis not present

## 2022-07-22 DIAGNOSIS — E1165 Type 2 diabetes mellitus with hyperglycemia: Secondary | ICD-10-CM | POA: Diagnosis not present

## 2022-07-22 DIAGNOSIS — R400 Somnolence: Secondary | ICD-10-CM | POA: Diagnosis not present

## 2022-07-22 DIAGNOSIS — E1169 Type 2 diabetes mellitus with other specified complication: Secondary | ICD-10-CM | POA: Diagnosis not present

## 2022-07-22 DIAGNOSIS — Z982 Presence of cerebrospinal fluid drainage device: Secondary | ICD-10-CM | POA: Diagnosis not present

## 2022-07-22 DIAGNOSIS — R0981 Nasal congestion: Secondary | ICD-10-CM | POA: Diagnosis not present

## 2022-07-22 DIAGNOSIS — M6281 Muscle weakness (generalized): Secondary | ICD-10-CM | POA: Diagnosis not present

## 2022-07-22 DIAGNOSIS — I10 Essential (primary) hypertension: Secondary | ICD-10-CM | POA: Diagnosis not present

## 2022-07-22 DIAGNOSIS — H8110 Benign paroxysmal vertigo, unspecified ear: Secondary | ICD-10-CM | POA: Diagnosis not present

## 2022-07-22 DIAGNOSIS — R062 Wheezing: Secondary | ICD-10-CM | POA: Diagnosis not present

## 2022-07-22 DIAGNOSIS — F3132 Bipolar disorder, current episode depressed, moderate: Secondary | ICD-10-CM | POA: Diagnosis not present

## 2022-07-22 DIAGNOSIS — Z48812 Encounter for surgical aftercare following surgery on the circulatory system: Secondary | ICD-10-CM | POA: Diagnosis not present

## 2022-07-22 DIAGNOSIS — M25531 Pain in right wrist: Secondary | ICD-10-CM | POA: Diagnosis not present

## 2022-07-22 DIAGNOSIS — F319 Bipolar disorder, unspecified: Secondary | ICD-10-CM | POA: Diagnosis not present

## 2022-07-22 DIAGNOSIS — G6281 Critical illness polyneuropathy: Secondary | ICD-10-CM | POA: Diagnosis not present

## 2022-07-22 DIAGNOSIS — N179 Acute kidney failure, unspecified: Secondary | ICD-10-CM | POA: Diagnosis not present

## 2022-07-22 DIAGNOSIS — F02B3 Dementia in other diseases classified elsewhere, moderate, with mood disturbance: Secondary | ICD-10-CM | POA: Diagnosis not present

## 2022-07-22 DIAGNOSIS — J45909 Unspecified asthma, uncomplicated: Secondary | ICD-10-CM | POA: Diagnosis not present

## 2022-07-22 DIAGNOSIS — E1110 Type 2 diabetes mellitus with ketoacidosis without coma: Secondary | ICD-10-CM | POA: Diagnosis not present

## 2022-07-22 DIAGNOSIS — G300 Alzheimer's disease with early onset: Secondary | ICD-10-CM | POA: Diagnosis not present

## 2022-07-22 DIAGNOSIS — R4182 Altered mental status, unspecified: Secondary | ICD-10-CM | POA: Diagnosis not present

## 2022-07-22 DIAGNOSIS — F02C0 Dementia in other diseases classified elsewhere, severe, without behavioral disturbance, psychotic disturbance, mood disturbance, and anxiety: Secondary | ICD-10-CM | POA: Diagnosis not present

## 2022-07-27 ENCOUNTER — Other Ambulatory Visit (HOSPITAL_COMMUNITY): Payer: Self-pay | Admitting: Student

## 2022-07-27 DIAGNOSIS — G912 (Idiopathic) normal pressure hydrocephalus: Secondary | ICD-10-CM

## 2022-07-29 DIAGNOSIS — I10 Essential (primary) hypertension: Secondary | ICD-10-CM | POA: Diagnosis not present

## 2022-07-29 DIAGNOSIS — F29 Unspecified psychosis not due to a substance or known physiological condition: Secondary | ICD-10-CM | POA: Diagnosis not present

## 2022-07-29 DIAGNOSIS — R600 Localized edema: Secondary | ICD-10-CM | POA: Diagnosis not present

## 2022-07-29 DIAGNOSIS — G6281 Critical illness polyneuropathy: Secondary | ICD-10-CM | POA: Diagnosis not present

## 2022-07-29 DIAGNOSIS — E11649 Type 2 diabetes mellitus with hypoglycemia without coma: Secondary | ICD-10-CM | POA: Diagnosis not present

## 2022-07-29 DIAGNOSIS — R400 Somnolence: Secondary | ICD-10-CM | POA: Diagnosis not present

## 2022-07-29 DIAGNOSIS — E1110 Type 2 diabetes mellitus with ketoacidosis without coma: Secondary | ICD-10-CM | POA: Diagnosis not present

## 2022-07-29 DIAGNOSIS — F3132 Bipolar disorder, current episode depressed, moderate: Secondary | ICD-10-CM | POA: Diagnosis not present

## 2022-07-29 DIAGNOSIS — M6281 Muscle weakness (generalized): Secondary | ICD-10-CM | POA: Diagnosis not present

## 2022-07-29 DIAGNOSIS — G300 Alzheimer's disease with early onset: Secondary | ICD-10-CM | POA: Diagnosis not present

## 2022-07-29 DIAGNOSIS — M25531 Pain in right wrist: Secondary | ICD-10-CM | POA: Diagnosis not present

## 2022-07-29 DIAGNOSIS — N179 Acute kidney failure, unspecified: Secondary | ICD-10-CM | POA: Diagnosis not present

## 2022-07-29 DIAGNOSIS — F02C0 Dementia in other diseases classified elsewhere, severe, without behavioral disturbance, psychotic disturbance, mood disturbance, and anxiety: Secondary | ICD-10-CM | POA: Diagnosis not present

## 2022-07-29 DIAGNOSIS — H8110 Benign paroxysmal vertigo, unspecified ear: Secondary | ICD-10-CM | POA: Diagnosis not present

## 2022-07-29 DIAGNOSIS — R0981 Nasal congestion: Secondary | ICD-10-CM | POA: Diagnosis not present

## 2022-07-29 DIAGNOSIS — Z982 Presence of cerebrospinal fluid drainage device: Secondary | ICD-10-CM | POA: Diagnosis not present

## 2022-07-29 DIAGNOSIS — J45909 Unspecified asthma, uncomplicated: Secondary | ICD-10-CM | POA: Diagnosis not present

## 2022-07-29 DIAGNOSIS — Z48812 Encounter for surgical aftercare following surgery on the circulatory system: Secondary | ICD-10-CM | POA: Diagnosis not present

## 2022-07-29 DIAGNOSIS — E1169 Type 2 diabetes mellitus with other specified complication: Secondary | ICD-10-CM | POA: Diagnosis not present

## 2022-07-29 DIAGNOSIS — R062 Wheezing: Secondary | ICD-10-CM | POA: Diagnosis not present

## 2022-07-29 DIAGNOSIS — F319 Bipolar disorder, unspecified: Secondary | ICD-10-CM | POA: Diagnosis not present

## 2022-07-29 DIAGNOSIS — F02B3 Dementia in other diseases classified elsewhere, moderate, with mood disturbance: Secondary | ICD-10-CM | POA: Diagnosis not present

## 2022-07-29 DIAGNOSIS — G6289 Other specified polyneuropathies: Secondary | ICD-10-CM | POA: Diagnosis not present

## 2022-07-29 DIAGNOSIS — R4182 Altered mental status, unspecified: Secondary | ICD-10-CM | POA: Diagnosis not present

## 2022-07-29 DIAGNOSIS — E1165 Type 2 diabetes mellitus with hyperglycemia: Secondary | ICD-10-CM | POA: Diagnosis not present

## 2022-08-05 ENCOUNTER — Ambulatory Visit (HOSPITAL_COMMUNITY)
Admission: RE | Admit: 2022-08-05 | Discharge: 2022-08-05 | Disposition: A | Discharge status: Home / Self Care | Payer: Medicaid Other | Source: Ambulatory Visit | Attending: Student | Admitting: Student

## 2022-08-05 DIAGNOSIS — Z982 Presence of cerebrospinal fluid drainage device: Secondary | ICD-10-CM | POA: Diagnosis not present

## 2022-08-05 DIAGNOSIS — I672 Cerebral atherosclerosis: Secondary | ICD-10-CM | POA: Diagnosis not present

## 2022-08-05 DIAGNOSIS — G912 (Idiopathic) normal pressure hydrocephalus: Secondary | ICD-10-CM | POA: Insufficient documentation

## 2022-08-05 DIAGNOSIS — D181 Lymphangioma, any site: Secondary | ICD-10-CM | POA: Diagnosis not present

## 2022-08-07 DIAGNOSIS — R400 Somnolence: Secondary | ICD-10-CM | POA: Diagnosis not present

## 2022-08-07 DIAGNOSIS — H8110 Benign paroxysmal vertigo, unspecified ear: Secondary | ICD-10-CM | POA: Diagnosis not present

## 2022-08-07 DIAGNOSIS — E1110 Type 2 diabetes mellitus with ketoacidosis without coma: Secondary | ICD-10-CM | POA: Diagnosis not present

## 2022-08-07 DIAGNOSIS — F02C0 Dementia in other diseases classified elsewhere, severe, without behavioral disturbance, psychotic disturbance, mood disturbance, and anxiety: Secondary | ICD-10-CM | POA: Diagnosis not present

## 2022-08-07 DIAGNOSIS — N179 Acute kidney failure, unspecified: Secondary | ICD-10-CM | POA: Diagnosis not present

## 2022-08-07 DIAGNOSIS — G6281 Critical illness polyneuropathy: Secondary | ICD-10-CM | POA: Diagnosis not present

## 2022-08-07 DIAGNOSIS — F3132 Bipolar disorder, current episode depressed, moderate: Secondary | ICD-10-CM | POA: Diagnosis not present

## 2022-08-07 DIAGNOSIS — Z48812 Encounter for surgical aftercare following surgery on the circulatory system: Secondary | ICD-10-CM | POA: Diagnosis not present

## 2022-08-07 DIAGNOSIS — G6289 Other specified polyneuropathies: Secondary | ICD-10-CM | POA: Diagnosis not present

## 2022-08-07 DIAGNOSIS — Z982 Presence of cerebrospinal fluid drainage device: Secondary | ICD-10-CM | POA: Diagnosis not present

## 2022-08-07 DIAGNOSIS — R062 Wheezing: Secondary | ICD-10-CM | POA: Diagnosis not present

## 2022-08-07 DIAGNOSIS — R0981 Nasal congestion: Secondary | ICD-10-CM | POA: Diagnosis not present

## 2022-08-07 DIAGNOSIS — M25531 Pain in right wrist: Secondary | ICD-10-CM | POA: Diagnosis not present

## 2022-08-07 DIAGNOSIS — F319 Bipolar disorder, unspecified: Secondary | ICD-10-CM | POA: Diagnosis not present

## 2022-08-07 DIAGNOSIS — E1165 Type 2 diabetes mellitus with hyperglycemia: Secondary | ICD-10-CM | POA: Diagnosis not present

## 2022-08-07 DIAGNOSIS — R4182 Altered mental status, unspecified: Secondary | ICD-10-CM | POA: Diagnosis not present

## 2022-08-07 DIAGNOSIS — E1169 Type 2 diabetes mellitus with other specified complication: Secondary | ICD-10-CM | POA: Diagnosis not present

## 2022-08-07 DIAGNOSIS — R600 Localized edema: Secondary | ICD-10-CM | POA: Diagnosis not present

## 2022-08-07 DIAGNOSIS — F29 Unspecified psychosis not due to a substance or known physiological condition: Secondary | ICD-10-CM | POA: Diagnosis not present

## 2022-08-07 DIAGNOSIS — G300 Alzheimer's disease with early onset: Secondary | ICD-10-CM | POA: Diagnosis not present

## 2022-08-07 DIAGNOSIS — I10 Essential (primary) hypertension: Secondary | ICD-10-CM | POA: Diagnosis not present

## 2022-08-07 DIAGNOSIS — J45909 Unspecified asthma, uncomplicated: Secondary | ICD-10-CM | POA: Diagnosis not present

## 2022-08-07 DIAGNOSIS — M6281 Muscle weakness (generalized): Secondary | ICD-10-CM | POA: Diagnosis not present

## 2022-08-07 DIAGNOSIS — F02B3 Dementia in other diseases classified elsewhere, moderate, with mood disturbance: Secondary | ICD-10-CM | POA: Diagnosis not present

## 2022-08-07 DIAGNOSIS — E11649 Type 2 diabetes mellitus with hypoglycemia without coma: Secondary | ICD-10-CM | POA: Diagnosis not present

## 2022-08-12 DIAGNOSIS — R062 Wheezing: Secondary | ICD-10-CM | POA: Diagnosis not present

## 2022-08-12 DIAGNOSIS — E1165 Type 2 diabetes mellitus with hyperglycemia: Secondary | ICD-10-CM | POA: Diagnosis not present

## 2022-08-12 DIAGNOSIS — G6281 Critical illness polyneuropathy: Secondary | ICD-10-CM | POA: Diagnosis not present

## 2022-08-12 DIAGNOSIS — R4182 Altered mental status, unspecified: Secondary | ICD-10-CM | POA: Diagnosis not present

## 2022-08-12 DIAGNOSIS — F02C0 Dementia in other diseases classified elsewhere, severe, without behavioral disturbance, psychotic disturbance, mood disturbance, and anxiety: Secondary | ICD-10-CM | POA: Diagnosis not present

## 2022-08-12 DIAGNOSIS — M25531 Pain in right wrist: Secondary | ICD-10-CM | POA: Diagnosis not present

## 2022-08-12 DIAGNOSIS — G6289 Other specified polyneuropathies: Secondary | ICD-10-CM | POA: Diagnosis not present

## 2022-08-12 DIAGNOSIS — R400 Somnolence: Secondary | ICD-10-CM | POA: Diagnosis not present

## 2022-08-12 DIAGNOSIS — F02B3 Dementia in other diseases classified elsewhere, moderate, with mood disturbance: Secondary | ICD-10-CM | POA: Diagnosis not present

## 2022-08-12 DIAGNOSIS — G300 Alzheimer's disease with early onset: Secondary | ICD-10-CM | POA: Diagnosis not present

## 2022-08-12 DIAGNOSIS — H8110 Benign paroxysmal vertigo, unspecified ear: Secondary | ICD-10-CM | POA: Diagnosis not present

## 2022-08-12 DIAGNOSIS — M6281 Muscle weakness (generalized): Secondary | ICD-10-CM | POA: Diagnosis not present

## 2022-08-12 DIAGNOSIS — R0981 Nasal congestion: Secondary | ICD-10-CM | POA: Diagnosis not present

## 2022-08-12 DIAGNOSIS — I10 Essential (primary) hypertension: Secondary | ICD-10-CM | POA: Diagnosis not present

## 2022-08-12 DIAGNOSIS — N179 Acute kidney failure, unspecified: Secondary | ICD-10-CM | POA: Diagnosis not present

## 2022-08-12 DIAGNOSIS — E11649 Type 2 diabetes mellitus with hypoglycemia without coma: Secondary | ICD-10-CM | POA: Diagnosis not present

## 2022-08-12 DIAGNOSIS — F3132 Bipolar disorder, current episode depressed, moderate: Secondary | ICD-10-CM | POA: Diagnosis not present

## 2022-08-12 DIAGNOSIS — F29 Unspecified psychosis not due to a substance or known physiological condition: Secondary | ICD-10-CM | POA: Diagnosis not present

## 2022-08-12 DIAGNOSIS — E1169 Type 2 diabetes mellitus with other specified complication: Secondary | ICD-10-CM | POA: Diagnosis not present

## 2022-08-12 DIAGNOSIS — E1110 Type 2 diabetes mellitus with ketoacidosis without coma: Secondary | ICD-10-CM | POA: Diagnosis not present

## 2022-08-12 DIAGNOSIS — Z48812 Encounter for surgical aftercare following surgery on the circulatory system: Secondary | ICD-10-CM | POA: Diagnosis not present

## 2022-08-12 DIAGNOSIS — R600 Localized edema: Secondary | ICD-10-CM | POA: Diagnosis not present

## 2022-08-12 DIAGNOSIS — Z982 Presence of cerebrospinal fluid drainage device: Secondary | ICD-10-CM | POA: Diagnosis not present

## 2022-08-12 DIAGNOSIS — F319 Bipolar disorder, unspecified: Secondary | ICD-10-CM | POA: Diagnosis not present

## 2022-08-12 DIAGNOSIS — J45909 Unspecified asthma, uncomplicated: Secondary | ICD-10-CM | POA: Diagnosis not present

## 2022-08-13 DIAGNOSIS — Z7189 Other specified counseling: Secondary | ICD-10-CM | POA: Diagnosis not present

## 2022-08-13 DIAGNOSIS — G912 (Idiopathic) normal pressure hydrocephalus: Secondary | ICD-10-CM | POA: Diagnosis not present

## 2022-08-13 DIAGNOSIS — K219 Gastro-esophageal reflux disease without esophagitis: Secondary | ICD-10-CM | POA: Diagnosis not present

## 2022-08-13 DIAGNOSIS — E1165 Type 2 diabetes mellitus with hyperglycemia: Secondary | ICD-10-CM | POA: Diagnosis not present

## 2022-08-13 DIAGNOSIS — Z79899 Other long term (current) drug therapy: Secondary | ICD-10-CM | POA: Diagnosis not present

## 2022-08-13 DIAGNOSIS — Z982 Presence of cerebrospinal fluid drainage device: Secondary | ICD-10-CM | POA: Diagnosis not present

## 2022-08-13 DIAGNOSIS — I10 Essential (primary) hypertension: Secondary | ICD-10-CM | POA: Diagnosis not present

## 2022-08-13 DIAGNOSIS — Z794 Long term (current) use of insulin: Secondary | ICD-10-CM | POA: Diagnosis not present

## 2022-08-17 DIAGNOSIS — Z79899 Other long term (current) drug therapy: Secondary | ICD-10-CM | POA: Diagnosis not present

## 2022-08-17 DIAGNOSIS — J0110 Acute frontal sinusitis, unspecified: Secondary | ICD-10-CM | POA: Diagnosis not present

## 2022-08-19 DIAGNOSIS — M25531 Pain in right wrist: Secondary | ICD-10-CM | POA: Diagnosis not present

## 2022-08-19 DIAGNOSIS — E1110 Type 2 diabetes mellitus with ketoacidosis without coma: Secondary | ICD-10-CM | POA: Diagnosis not present

## 2022-08-19 DIAGNOSIS — G300 Alzheimer's disease with early onset: Secondary | ICD-10-CM | POA: Diagnosis not present

## 2022-08-19 DIAGNOSIS — M6281 Muscle weakness (generalized): Secondary | ICD-10-CM | POA: Diagnosis not present

## 2022-08-19 DIAGNOSIS — R0981 Nasal congestion: Secondary | ICD-10-CM | POA: Diagnosis not present

## 2022-08-19 DIAGNOSIS — F319 Bipolar disorder, unspecified: Secondary | ICD-10-CM | POA: Diagnosis not present

## 2022-08-19 DIAGNOSIS — E1165 Type 2 diabetes mellitus with hyperglycemia: Secondary | ICD-10-CM | POA: Diagnosis not present

## 2022-08-19 DIAGNOSIS — G6281 Critical illness polyneuropathy: Secondary | ICD-10-CM | POA: Diagnosis not present

## 2022-08-19 DIAGNOSIS — G6289 Other specified polyneuropathies: Secondary | ICD-10-CM | POA: Diagnosis not present

## 2022-08-19 DIAGNOSIS — Z48812 Encounter for surgical aftercare following surgery on the circulatory system: Secondary | ICD-10-CM | POA: Diagnosis not present

## 2022-08-19 DIAGNOSIS — H8110 Benign paroxysmal vertigo, unspecified ear: Secondary | ICD-10-CM | POA: Diagnosis not present

## 2022-08-19 DIAGNOSIS — R400 Somnolence: Secondary | ICD-10-CM | POA: Diagnosis not present

## 2022-08-19 DIAGNOSIS — F02C0 Dementia in other diseases classified elsewhere, severe, without behavioral disturbance, psychotic disturbance, mood disturbance, and anxiety: Secondary | ICD-10-CM | POA: Diagnosis not present

## 2022-08-19 DIAGNOSIS — I10 Essential (primary) hypertension: Secondary | ICD-10-CM | POA: Diagnosis not present

## 2022-08-19 DIAGNOSIS — F02B3 Dementia in other diseases classified elsewhere, moderate, with mood disturbance: Secondary | ICD-10-CM | POA: Diagnosis not present

## 2022-08-19 DIAGNOSIS — E1169 Type 2 diabetes mellitus with other specified complication: Secondary | ICD-10-CM | POA: Diagnosis not present

## 2022-08-19 DIAGNOSIS — F29 Unspecified psychosis not due to a substance or known physiological condition: Secondary | ICD-10-CM | POA: Diagnosis not present

## 2022-08-19 DIAGNOSIS — J45909 Unspecified asthma, uncomplicated: Secondary | ICD-10-CM | POA: Diagnosis not present

## 2022-08-19 DIAGNOSIS — N179 Acute kidney failure, unspecified: Secondary | ICD-10-CM | POA: Diagnosis not present

## 2022-08-19 DIAGNOSIS — R600 Localized edema: Secondary | ICD-10-CM | POA: Diagnosis not present

## 2022-08-19 DIAGNOSIS — Z982 Presence of cerebrospinal fluid drainage device: Secondary | ICD-10-CM | POA: Diagnosis not present

## 2022-08-19 DIAGNOSIS — R4182 Altered mental status, unspecified: Secondary | ICD-10-CM | POA: Diagnosis not present

## 2022-08-19 DIAGNOSIS — F3132 Bipolar disorder, current episode depressed, moderate: Secondary | ICD-10-CM | POA: Diagnosis not present

## 2022-08-19 DIAGNOSIS — E11649 Type 2 diabetes mellitus with hypoglycemia without coma: Secondary | ICD-10-CM | POA: Diagnosis not present

## 2022-08-19 DIAGNOSIS — R062 Wheezing: Secondary | ICD-10-CM | POA: Diagnosis not present

## 2022-08-20 DIAGNOSIS — S92353D Displaced fracture of fifth metatarsal bone, unspecified foot, subsequent encounter for fracture with routine healing: Secondary | ICD-10-CM | POA: Diagnosis not present

## 2022-08-20 DIAGNOSIS — R0989 Other specified symptoms and signs involving the circulatory and respiratory systems: Secondary | ICD-10-CM | POA: Diagnosis not present

## 2022-08-20 DIAGNOSIS — R52 Pain, unspecified: Secondary | ICD-10-CM | POA: Diagnosis not present

## 2022-08-20 DIAGNOSIS — S92323D Displaced fracture of second metatarsal bone, unspecified foot, subsequent encounter for fracture with routine healing: Secondary | ICD-10-CM | POA: Diagnosis not present

## 2022-08-25 DIAGNOSIS — Z79899 Other long term (current) drug therapy: Secondary | ICD-10-CM | POA: Diagnosis not present

## 2022-08-25 DIAGNOSIS — I10 Essential (primary) hypertension: Secondary | ICD-10-CM | POA: Diagnosis not present

## 2022-08-26 DIAGNOSIS — M6281 Muscle weakness (generalized): Secondary | ICD-10-CM | POA: Diagnosis not present

## 2022-08-26 DIAGNOSIS — G6289 Other specified polyneuropathies: Secondary | ICD-10-CM | POA: Diagnosis not present

## 2022-08-26 DIAGNOSIS — F02B3 Dementia in other diseases classified elsewhere, moderate, with mood disturbance: Secondary | ICD-10-CM | POA: Diagnosis not present

## 2022-08-26 DIAGNOSIS — E11649 Type 2 diabetes mellitus with hypoglycemia without coma: Secondary | ICD-10-CM | POA: Diagnosis not present

## 2022-08-26 DIAGNOSIS — J45909 Unspecified asthma, uncomplicated: Secondary | ICD-10-CM | POA: Diagnosis not present

## 2022-08-26 DIAGNOSIS — R0981 Nasal congestion: Secondary | ICD-10-CM | POA: Diagnosis not present

## 2022-08-26 DIAGNOSIS — E1169 Type 2 diabetes mellitus with other specified complication: Secondary | ICD-10-CM | POA: Diagnosis not present

## 2022-08-26 DIAGNOSIS — E1110 Type 2 diabetes mellitus with ketoacidosis without coma: Secondary | ICD-10-CM | POA: Diagnosis not present

## 2022-08-26 DIAGNOSIS — R4182 Altered mental status, unspecified: Secondary | ICD-10-CM | POA: Diagnosis not present

## 2022-08-26 DIAGNOSIS — G6281 Critical illness polyneuropathy: Secondary | ICD-10-CM | POA: Diagnosis not present

## 2022-08-26 DIAGNOSIS — Z982 Presence of cerebrospinal fluid drainage device: Secondary | ICD-10-CM | POA: Diagnosis not present

## 2022-08-26 DIAGNOSIS — R400 Somnolence: Secondary | ICD-10-CM | POA: Diagnosis not present

## 2022-08-26 DIAGNOSIS — E1165 Type 2 diabetes mellitus with hyperglycemia: Secondary | ICD-10-CM | POA: Diagnosis not present

## 2022-08-26 DIAGNOSIS — F02C0 Dementia in other diseases classified elsewhere, severe, without behavioral disturbance, psychotic disturbance, mood disturbance, and anxiety: Secondary | ICD-10-CM | POA: Diagnosis not present

## 2022-08-26 DIAGNOSIS — F3132 Bipolar disorder, current episode depressed, moderate: Secondary | ICD-10-CM | POA: Diagnosis not present

## 2022-08-26 DIAGNOSIS — N179 Acute kidney failure, unspecified: Secondary | ICD-10-CM | POA: Diagnosis not present

## 2022-08-26 DIAGNOSIS — H8110 Benign paroxysmal vertigo, unspecified ear: Secondary | ICD-10-CM | POA: Diagnosis not present

## 2022-08-26 DIAGNOSIS — F319 Bipolar disorder, unspecified: Secondary | ICD-10-CM | POA: Diagnosis not present

## 2022-08-26 DIAGNOSIS — G300 Alzheimer's disease with early onset: Secondary | ICD-10-CM | POA: Diagnosis not present

## 2022-08-26 DIAGNOSIS — R600 Localized edema: Secondary | ICD-10-CM | POA: Diagnosis not present

## 2022-08-26 DIAGNOSIS — M25531 Pain in right wrist: Secondary | ICD-10-CM | POA: Diagnosis not present

## 2022-08-26 DIAGNOSIS — Z48812 Encounter for surgical aftercare following surgery on the circulatory system: Secondary | ICD-10-CM | POA: Diagnosis not present

## 2022-08-26 DIAGNOSIS — I10 Essential (primary) hypertension: Secondary | ICD-10-CM | POA: Diagnosis not present

## 2022-08-26 DIAGNOSIS — R062 Wheezing: Secondary | ICD-10-CM | POA: Diagnosis not present

## 2022-08-26 DIAGNOSIS — F29 Unspecified psychosis not due to a substance or known physiological condition: Secondary | ICD-10-CM | POA: Diagnosis not present

## 2022-09-02 DIAGNOSIS — E1169 Type 2 diabetes mellitus with other specified complication: Secondary | ICD-10-CM | POA: Diagnosis not present

## 2022-09-02 DIAGNOSIS — G6281 Critical illness polyneuropathy: Secondary | ICD-10-CM | POA: Diagnosis not present

## 2022-09-02 DIAGNOSIS — F02B3 Dementia in other diseases classified elsewhere, moderate, with mood disturbance: Secondary | ICD-10-CM | POA: Diagnosis not present

## 2022-09-02 DIAGNOSIS — Z48812 Encounter for surgical aftercare following surgery on the circulatory system: Secondary | ICD-10-CM | POA: Diagnosis not present

## 2022-09-02 DIAGNOSIS — E1165 Type 2 diabetes mellitus with hyperglycemia: Secondary | ICD-10-CM | POA: Diagnosis not present

## 2022-09-02 DIAGNOSIS — J45909 Unspecified asthma, uncomplicated: Secondary | ICD-10-CM | POA: Diagnosis not present

## 2022-09-02 DIAGNOSIS — Z982 Presence of cerebrospinal fluid drainage device: Secondary | ICD-10-CM | POA: Diagnosis not present

## 2022-09-02 DIAGNOSIS — F02C0 Dementia in other diseases classified elsewhere, severe, without behavioral disturbance, psychotic disturbance, mood disturbance, and anxiety: Secondary | ICD-10-CM | POA: Diagnosis not present

## 2022-09-02 DIAGNOSIS — M25531 Pain in right wrist: Secondary | ICD-10-CM | POA: Diagnosis not present

## 2022-09-02 DIAGNOSIS — R600 Localized edema: Secondary | ICD-10-CM | POA: Diagnosis not present

## 2022-09-02 DIAGNOSIS — F3132 Bipolar disorder, current episode depressed, moderate: Secondary | ICD-10-CM | POA: Diagnosis not present

## 2022-09-02 DIAGNOSIS — G6289 Other specified polyneuropathies: Secondary | ICD-10-CM | POA: Diagnosis not present

## 2022-09-02 DIAGNOSIS — M6281 Muscle weakness (generalized): Secondary | ICD-10-CM | POA: Diagnosis not present

## 2022-09-02 DIAGNOSIS — R062 Wheezing: Secondary | ICD-10-CM | POA: Diagnosis not present

## 2022-09-02 DIAGNOSIS — E1110 Type 2 diabetes mellitus with ketoacidosis without coma: Secondary | ICD-10-CM | POA: Diagnosis not present

## 2022-09-02 DIAGNOSIS — R400 Somnolence: Secondary | ICD-10-CM | POA: Diagnosis not present

## 2022-09-02 DIAGNOSIS — H8110 Benign paroxysmal vertigo, unspecified ear: Secondary | ICD-10-CM | POA: Diagnosis not present

## 2022-09-02 DIAGNOSIS — M79671 Pain in right foot: Secondary | ICD-10-CM | POA: Diagnosis not present

## 2022-09-02 DIAGNOSIS — F319 Bipolar disorder, unspecified: Secondary | ICD-10-CM | POA: Diagnosis not present

## 2022-09-02 DIAGNOSIS — N179 Acute kidney failure, unspecified: Secondary | ICD-10-CM | POA: Diagnosis not present

## 2022-09-02 DIAGNOSIS — E11649 Type 2 diabetes mellitus with hypoglycemia without coma: Secondary | ICD-10-CM | POA: Diagnosis not present

## 2022-09-02 DIAGNOSIS — F29 Unspecified psychosis not due to a substance or known physiological condition: Secondary | ICD-10-CM | POA: Diagnosis not present

## 2022-09-02 DIAGNOSIS — R0981 Nasal congestion: Secondary | ICD-10-CM | POA: Diagnosis not present

## 2022-09-02 DIAGNOSIS — R4182 Altered mental status, unspecified: Secondary | ICD-10-CM | POA: Diagnosis not present

## 2022-09-02 DIAGNOSIS — G300 Alzheimer's disease with early onset: Secondary | ICD-10-CM | POA: Diagnosis not present

## 2022-09-02 DIAGNOSIS — I10 Essential (primary) hypertension: Secondary | ICD-10-CM | POA: Diagnosis not present

## 2022-09-03 DIAGNOSIS — S92323D Displaced fracture of second metatarsal bone, unspecified foot, subsequent encounter for fracture with routine healing: Secondary | ICD-10-CM | POA: Diagnosis not present

## 2022-09-03 DIAGNOSIS — S92353D Displaced fracture of fifth metatarsal bone, unspecified foot, subsequent encounter for fracture with routine healing: Secondary | ICD-10-CM | POA: Diagnosis not present

## 2022-09-03 DIAGNOSIS — R52 Pain, unspecified: Secondary | ICD-10-CM | POA: Diagnosis not present

## 2022-09-07 DIAGNOSIS — E11649 Type 2 diabetes mellitus with hypoglycemia without coma: Secondary | ICD-10-CM | POA: Diagnosis not present

## 2022-09-07 DIAGNOSIS — I10 Essential (primary) hypertension: Secondary | ICD-10-CM | POA: Diagnosis not present

## 2022-09-07 DIAGNOSIS — E1110 Type 2 diabetes mellitus with ketoacidosis without coma: Secondary | ICD-10-CM | POA: Diagnosis not present

## 2022-09-07 DIAGNOSIS — R400 Somnolence: Secondary | ICD-10-CM | POA: Diagnosis not present

## 2022-09-07 DIAGNOSIS — G300 Alzheimer's disease with early onset: Secondary | ICD-10-CM | POA: Diagnosis not present

## 2022-09-07 DIAGNOSIS — Z982 Presence of cerebrospinal fluid drainage device: Secondary | ICD-10-CM | POA: Diagnosis not present

## 2022-09-07 DIAGNOSIS — G6281 Critical illness polyneuropathy: Secondary | ICD-10-CM | POA: Diagnosis not present

## 2022-09-07 DIAGNOSIS — R062 Wheezing: Secondary | ICD-10-CM | POA: Diagnosis not present

## 2022-09-07 DIAGNOSIS — F29 Unspecified psychosis not due to a substance or known physiological condition: Secondary | ICD-10-CM | POA: Diagnosis not present

## 2022-09-07 DIAGNOSIS — E1169 Type 2 diabetes mellitus with other specified complication: Secondary | ICD-10-CM | POA: Diagnosis not present

## 2022-09-07 DIAGNOSIS — H8110 Benign paroxysmal vertigo, unspecified ear: Secondary | ICD-10-CM | POA: Diagnosis not present

## 2022-09-07 DIAGNOSIS — R600 Localized edema: Secondary | ICD-10-CM | POA: Diagnosis not present

## 2022-09-07 DIAGNOSIS — F319 Bipolar disorder, unspecified: Secondary | ICD-10-CM | POA: Diagnosis not present

## 2022-09-07 DIAGNOSIS — Z48812 Encounter for surgical aftercare following surgery on the circulatory system: Secondary | ICD-10-CM | POA: Diagnosis not present

## 2022-09-07 DIAGNOSIS — R4182 Altered mental status, unspecified: Secondary | ICD-10-CM | POA: Diagnosis not present

## 2022-09-07 DIAGNOSIS — G6289 Other specified polyneuropathies: Secondary | ICD-10-CM | POA: Diagnosis not present

## 2022-09-07 DIAGNOSIS — F02B3 Dementia in other diseases classified elsewhere, moderate, with mood disturbance: Secondary | ICD-10-CM | POA: Diagnosis not present

## 2022-09-07 DIAGNOSIS — F02C0 Dementia in other diseases classified elsewhere, severe, without behavioral disturbance, psychotic disturbance, mood disturbance, and anxiety: Secondary | ICD-10-CM | POA: Diagnosis not present

## 2022-09-07 DIAGNOSIS — J45909 Unspecified asthma, uncomplicated: Secondary | ICD-10-CM | POA: Diagnosis not present

## 2022-09-07 DIAGNOSIS — R0981 Nasal congestion: Secondary | ICD-10-CM | POA: Diagnosis not present

## 2022-09-07 DIAGNOSIS — E1165 Type 2 diabetes mellitus with hyperglycemia: Secondary | ICD-10-CM | POA: Diagnosis not present

## 2022-09-07 DIAGNOSIS — F3132 Bipolar disorder, current episode depressed, moderate: Secondary | ICD-10-CM | POA: Diagnosis not present

## 2022-09-07 DIAGNOSIS — M25531 Pain in right wrist: Secondary | ICD-10-CM | POA: Diagnosis not present

## 2022-09-07 DIAGNOSIS — M6281 Muscle weakness (generalized): Secondary | ICD-10-CM | POA: Diagnosis not present

## 2022-09-07 DIAGNOSIS — N179 Acute kidney failure, unspecified: Secondary | ICD-10-CM | POA: Diagnosis not present

## 2022-09-08 DIAGNOSIS — F319 Bipolar disorder, unspecified: Secondary | ICD-10-CM | POA: Diagnosis not present

## 2022-09-08 DIAGNOSIS — Z79899 Other long term (current) drug therapy: Secondary | ICD-10-CM | POA: Diagnosis not present

## 2022-09-10 DIAGNOSIS — R52 Pain, unspecified: Secondary | ICD-10-CM | POA: Diagnosis not present

## 2022-09-10 DIAGNOSIS — Z79899 Other long term (current) drug therapy: Secondary | ICD-10-CM | POA: Diagnosis not present

## 2022-09-16 DIAGNOSIS — F3132 Bipolar disorder, current episode depressed, moderate: Secondary | ICD-10-CM | POA: Diagnosis not present

## 2022-09-16 DIAGNOSIS — E1110 Type 2 diabetes mellitus with ketoacidosis without coma: Secondary | ICD-10-CM | POA: Diagnosis not present

## 2022-09-16 DIAGNOSIS — G6289 Other specified polyneuropathies: Secondary | ICD-10-CM | POA: Diagnosis not present

## 2022-09-16 DIAGNOSIS — R062 Wheezing: Secondary | ICD-10-CM | POA: Diagnosis not present

## 2022-09-16 DIAGNOSIS — F319 Bipolar disorder, unspecified: Secondary | ICD-10-CM | POA: Diagnosis not present

## 2022-09-16 DIAGNOSIS — J45909 Unspecified asthma, uncomplicated: Secondary | ICD-10-CM | POA: Diagnosis not present

## 2022-09-16 DIAGNOSIS — E1169 Type 2 diabetes mellitus with other specified complication: Secondary | ICD-10-CM | POA: Diagnosis not present

## 2022-09-16 DIAGNOSIS — R0981 Nasal congestion: Secondary | ICD-10-CM | POA: Diagnosis not present

## 2022-09-16 DIAGNOSIS — Z48812 Encounter for surgical aftercare following surgery on the circulatory system: Secondary | ICD-10-CM | POA: Diagnosis not present

## 2022-09-16 DIAGNOSIS — E11649 Type 2 diabetes mellitus with hypoglycemia without coma: Secondary | ICD-10-CM | POA: Diagnosis not present

## 2022-09-16 DIAGNOSIS — M6281 Muscle weakness (generalized): Secondary | ICD-10-CM | POA: Diagnosis not present

## 2022-09-16 DIAGNOSIS — I10 Essential (primary) hypertension: Secondary | ICD-10-CM | POA: Diagnosis not present

## 2022-09-16 DIAGNOSIS — F02C0 Dementia in other diseases classified elsewhere, severe, without behavioral disturbance, psychotic disturbance, mood disturbance, and anxiety: Secondary | ICD-10-CM | POA: Diagnosis not present

## 2022-09-16 DIAGNOSIS — N179 Acute kidney failure, unspecified: Secondary | ICD-10-CM | POA: Diagnosis not present

## 2022-09-16 DIAGNOSIS — R600 Localized edema: Secondary | ICD-10-CM | POA: Diagnosis not present

## 2022-09-16 DIAGNOSIS — G300 Alzheimer's disease with early onset: Secondary | ICD-10-CM | POA: Diagnosis not present

## 2022-09-16 DIAGNOSIS — H8110 Benign paroxysmal vertigo, unspecified ear: Secondary | ICD-10-CM | POA: Diagnosis not present

## 2022-09-16 DIAGNOSIS — M25531 Pain in right wrist: Secondary | ICD-10-CM | POA: Diagnosis not present

## 2022-09-16 DIAGNOSIS — E1165 Type 2 diabetes mellitus with hyperglycemia: Secondary | ICD-10-CM | POA: Diagnosis not present

## 2022-09-16 DIAGNOSIS — Z982 Presence of cerebrospinal fluid drainage device: Secondary | ICD-10-CM | POA: Diagnosis not present

## 2022-09-16 DIAGNOSIS — R400 Somnolence: Secondary | ICD-10-CM | POA: Diagnosis not present

## 2022-09-16 DIAGNOSIS — R4182 Altered mental status, unspecified: Secondary | ICD-10-CM | POA: Diagnosis not present

## 2022-09-16 DIAGNOSIS — F02B3 Dementia in other diseases classified elsewhere, moderate, with mood disturbance: Secondary | ICD-10-CM | POA: Diagnosis not present

## 2022-09-16 DIAGNOSIS — F29 Unspecified psychosis not due to a substance or known physiological condition: Secondary | ICD-10-CM | POA: Diagnosis not present

## 2022-09-16 DIAGNOSIS — G6281 Critical illness polyneuropathy: Secondary | ICD-10-CM | POA: Diagnosis not present

## 2022-09-17 DIAGNOSIS — G912 (Idiopathic) normal pressure hydrocephalus: Secondary | ICD-10-CM | POA: Diagnosis not present

## 2022-09-21 DIAGNOSIS — H25813 Combined forms of age-related cataract, bilateral: Secondary | ICD-10-CM | POA: Diagnosis not present

## 2022-09-21 DIAGNOSIS — E109 Type 1 diabetes mellitus without complications: Secondary | ICD-10-CM | POA: Diagnosis not present

## 2022-09-22 DIAGNOSIS — R399 Unspecified symptoms and signs involving the genitourinary system: Secondary | ICD-10-CM | POA: Diagnosis not present

## 2022-09-22 DIAGNOSIS — Z79899 Other long term (current) drug therapy: Secondary | ICD-10-CM | POA: Diagnosis not present

## 2022-09-23 DIAGNOSIS — G300 Alzheimer's disease with early onset: Secondary | ICD-10-CM | POA: Diagnosis not present

## 2022-09-23 DIAGNOSIS — F319 Bipolar disorder, unspecified: Secondary | ICD-10-CM | POA: Diagnosis not present

## 2022-09-23 DIAGNOSIS — E11649 Type 2 diabetes mellitus with hypoglycemia without coma: Secondary | ICD-10-CM | POA: Diagnosis not present

## 2022-09-23 DIAGNOSIS — Z48812 Encounter for surgical aftercare following surgery on the circulatory system: Secondary | ICD-10-CM | POA: Diagnosis not present

## 2022-09-23 DIAGNOSIS — F29 Unspecified psychosis not due to a substance or known physiological condition: Secondary | ICD-10-CM | POA: Diagnosis not present

## 2022-09-23 DIAGNOSIS — E1169 Type 2 diabetes mellitus with other specified complication: Secondary | ICD-10-CM | POA: Diagnosis not present

## 2022-09-23 DIAGNOSIS — N179 Acute kidney failure, unspecified: Secondary | ICD-10-CM | POA: Diagnosis not present

## 2022-09-23 DIAGNOSIS — H8110 Benign paroxysmal vertigo, unspecified ear: Secondary | ICD-10-CM | POA: Diagnosis not present

## 2022-09-23 DIAGNOSIS — R400 Somnolence: Secondary | ICD-10-CM | POA: Diagnosis not present

## 2022-09-23 DIAGNOSIS — E1110 Type 2 diabetes mellitus with ketoacidosis without coma: Secondary | ICD-10-CM | POA: Diagnosis not present

## 2022-09-23 DIAGNOSIS — R0981 Nasal congestion: Secondary | ICD-10-CM | POA: Diagnosis not present

## 2022-09-23 DIAGNOSIS — R600 Localized edema: Secondary | ICD-10-CM | POA: Diagnosis not present

## 2022-09-23 DIAGNOSIS — G6281 Critical illness polyneuropathy: Secondary | ICD-10-CM | POA: Diagnosis not present

## 2022-09-23 DIAGNOSIS — I10 Essential (primary) hypertension: Secondary | ICD-10-CM | POA: Diagnosis not present

## 2022-09-23 DIAGNOSIS — R062 Wheezing: Secondary | ICD-10-CM | POA: Diagnosis not present

## 2022-09-23 DIAGNOSIS — M25531 Pain in right wrist: Secondary | ICD-10-CM | POA: Diagnosis not present

## 2022-09-23 DIAGNOSIS — G6289 Other specified polyneuropathies: Secondary | ICD-10-CM | POA: Diagnosis not present

## 2022-09-23 DIAGNOSIS — E1165 Type 2 diabetes mellitus with hyperglycemia: Secondary | ICD-10-CM | POA: Diagnosis not present

## 2022-09-23 DIAGNOSIS — F02B3 Dementia in other diseases classified elsewhere, moderate, with mood disturbance: Secondary | ICD-10-CM | POA: Diagnosis not present

## 2022-09-23 DIAGNOSIS — N390 Urinary tract infection, site not specified: Secondary | ICD-10-CM | POA: Diagnosis not present

## 2022-09-23 DIAGNOSIS — Z982 Presence of cerebrospinal fluid drainage device: Secondary | ICD-10-CM | POA: Diagnosis not present

## 2022-09-23 DIAGNOSIS — J45909 Unspecified asthma, uncomplicated: Secondary | ICD-10-CM | POA: Diagnosis not present

## 2022-09-23 DIAGNOSIS — R4182 Altered mental status, unspecified: Secondary | ICD-10-CM | POA: Diagnosis not present

## 2022-09-23 DIAGNOSIS — F3132 Bipolar disorder, current episode depressed, moderate: Secondary | ICD-10-CM | POA: Diagnosis not present

## 2022-09-23 DIAGNOSIS — M6281 Muscle weakness (generalized): Secondary | ICD-10-CM | POA: Diagnosis not present

## 2022-09-23 DIAGNOSIS — F02C0 Dementia in other diseases classified elsewhere, severe, without behavioral disturbance, psychotic disturbance, mood disturbance, and anxiety: Secondary | ICD-10-CM | POA: Diagnosis not present

## 2022-09-30 DIAGNOSIS — Z48812 Encounter for surgical aftercare following surgery on the circulatory system: Secondary | ICD-10-CM | POA: Diagnosis not present

## 2022-09-30 DIAGNOSIS — R400 Somnolence: Secondary | ICD-10-CM | POA: Diagnosis not present

## 2022-09-30 DIAGNOSIS — F3132 Bipolar disorder, current episode depressed, moderate: Secondary | ICD-10-CM | POA: Diagnosis not present

## 2022-09-30 DIAGNOSIS — J45909 Unspecified asthma, uncomplicated: Secondary | ICD-10-CM | POA: Diagnosis not present

## 2022-09-30 DIAGNOSIS — F319 Bipolar disorder, unspecified: Secondary | ICD-10-CM | POA: Diagnosis not present

## 2022-09-30 DIAGNOSIS — E1165 Type 2 diabetes mellitus with hyperglycemia: Secondary | ICD-10-CM | POA: Diagnosis not present

## 2022-09-30 DIAGNOSIS — F02B3 Dementia in other diseases classified elsewhere, moderate, with mood disturbance: Secondary | ICD-10-CM | POA: Diagnosis not present

## 2022-09-30 DIAGNOSIS — G6289 Other specified polyneuropathies: Secondary | ICD-10-CM | POA: Diagnosis not present

## 2022-09-30 DIAGNOSIS — E1110 Type 2 diabetes mellitus with ketoacidosis without coma: Secondary | ICD-10-CM | POA: Diagnosis not present

## 2022-09-30 DIAGNOSIS — G300 Alzheimer's disease with early onset: Secondary | ICD-10-CM | POA: Diagnosis not present

## 2022-09-30 DIAGNOSIS — E1169 Type 2 diabetes mellitus with other specified complication: Secondary | ICD-10-CM | POA: Diagnosis not present

## 2022-09-30 DIAGNOSIS — Z982 Presence of cerebrospinal fluid drainage device: Secondary | ICD-10-CM | POA: Diagnosis not present

## 2022-09-30 DIAGNOSIS — F29 Unspecified psychosis not due to a substance or known physiological condition: Secondary | ICD-10-CM | POA: Diagnosis not present

## 2022-09-30 DIAGNOSIS — M25531 Pain in right wrist: Secondary | ICD-10-CM | POA: Diagnosis not present

## 2022-09-30 DIAGNOSIS — H8110 Benign paroxysmal vertigo, unspecified ear: Secondary | ICD-10-CM | POA: Diagnosis not present

## 2022-09-30 DIAGNOSIS — M6281 Muscle weakness (generalized): Secondary | ICD-10-CM | POA: Diagnosis not present

## 2022-09-30 DIAGNOSIS — R4182 Altered mental status, unspecified: Secondary | ICD-10-CM | POA: Diagnosis not present

## 2022-09-30 DIAGNOSIS — R0981 Nasal congestion: Secondary | ICD-10-CM | POA: Diagnosis not present

## 2022-09-30 DIAGNOSIS — E11649 Type 2 diabetes mellitus with hypoglycemia without coma: Secondary | ICD-10-CM | POA: Diagnosis not present

## 2022-09-30 DIAGNOSIS — N179 Acute kidney failure, unspecified: Secondary | ICD-10-CM | POA: Diagnosis not present

## 2022-09-30 DIAGNOSIS — F02C0 Dementia in other diseases classified elsewhere, severe, without behavioral disturbance, psychotic disturbance, mood disturbance, and anxiety: Secondary | ICD-10-CM | POA: Diagnosis not present

## 2022-09-30 DIAGNOSIS — I10 Essential (primary) hypertension: Secondary | ICD-10-CM | POA: Diagnosis not present

## 2022-09-30 DIAGNOSIS — R062 Wheezing: Secondary | ICD-10-CM | POA: Diagnosis not present

## 2022-09-30 DIAGNOSIS — R600 Localized edema: Secondary | ICD-10-CM | POA: Diagnosis not present

## 2022-09-30 DIAGNOSIS — G6281 Critical illness polyneuropathy: Secondary | ICD-10-CM | POA: Diagnosis not present

## 2022-10-02 DIAGNOSIS — M79671 Pain in right foot: Secondary | ICD-10-CM | POA: Diagnosis not present

## 2022-10-05 DIAGNOSIS — Z79899 Other long term (current) drug therapy: Secondary | ICD-10-CM | POA: Diagnosis not present

## 2022-10-05 DIAGNOSIS — E785 Hyperlipidemia, unspecified: Secondary | ICD-10-CM | POA: Diagnosis not present

## 2022-10-06 DIAGNOSIS — Z79899 Other long term (current) drug therapy: Secondary | ICD-10-CM | POA: Diagnosis not present

## 2022-10-06 DIAGNOSIS — E11649 Type 2 diabetes mellitus with hypoglycemia without coma: Secondary | ICD-10-CM | POA: Diagnosis not present

## 2022-10-08 DIAGNOSIS — G6281 Critical illness polyneuropathy: Secondary | ICD-10-CM | POA: Diagnosis not present

## 2022-10-08 DIAGNOSIS — I10 Essential (primary) hypertension: Secondary | ICD-10-CM | POA: Diagnosis not present

## 2022-10-08 DIAGNOSIS — G6289 Other specified polyneuropathies: Secondary | ICD-10-CM | POA: Diagnosis not present

## 2022-10-08 DIAGNOSIS — N179 Acute kidney failure, unspecified: Secondary | ICD-10-CM | POA: Diagnosis not present

## 2022-10-08 DIAGNOSIS — R600 Localized edema: Secondary | ICD-10-CM | POA: Diagnosis not present

## 2022-10-08 DIAGNOSIS — F02B3 Dementia in other diseases classified elsewhere, moderate, with mood disturbance: Secondary | ICD-10-CM | POA: Diagnosis not present

## 2022-10-08 DIAGNOSIS — R0981 Nasal congestion: Secondary | ICD-10-CM | POA: Diagnosis not present

## 2022-10-08 DIAGNOSIS — M6281 Muscle weakness (generalized): Secondary | ICD-10-CM | POA: Diagnosis not present

## 2022-10-08 DIAGNOSIS — E11649 Type 2 diabetes mellitus with hypoglycemia without coma: Secondary | ICD-10-CM | POA: Diagnosis not present

## 2022-10-08 DIAGNOSIS — R400 Somnolence: Secondary | ICD-10-CM | POA: Diagnosis not present

## 2022-10-08 DIAGNOSIS — H8110 Benign paroxysmal vertigo, unspecified ear: Secondary | ICD-10-CM | POA: Diagnosis not present

## 2022-10-08 DIAGNOSIS — Z982 Presence of cerebrospinal fluid drainage device: Secondary | ICD-10-CM | POA: Diagnosis not present

## 2022-10-08 DIAGNOSIS — E1169 Type 2 diabetes mellitus with other specified complication: Secondary | ICD-10-CM | POA: Diagnosis not present

## 2022-10-08 DIAGNOSIS — G300 Alzheimer's disease with early onset: Secondary | ICD-10-CM | POA: Diagnosis not present

## 2022-10-08 DIAGNOSIS — R4182 Altered mental status, unspecified: Secondary | ICD-10-CM | POA: Diagnosis not present

## 2022-10-08 DIAGNOSIS — E1110 Type 2 diabetes mellitus with ketoacidosis without coma: Secondary | ICD-10-CM | POA: Diagnosis not present

## 2022-10-08 DIAGNOSIS — J45909 Unspecified asthma, uncomplicated: Secondary | ICD-10-CM | POA: Diagnosis not present

## 2022-10-08 DIAGNOSIS — F02C0 Dementia in other diseases classified elsewhere, severe, without behavioral disturbance, psychotic disturbance, mood disturbance, and anxiety: Secondary | ICD-10-CM | POA: Diagnosis not present

## 2022-10-08 DIAGNOSIS — M25531 Pain in right wrist: Secondary | ICD-10-CM | POA: Diagnosis not present

## 2022-10-08 DIAGNOSIS — E1165 Type 2 diabetes mellitus with hyperglycemia: Secondary | ICD-10-CM | POA: Diagnosis not present

## 2022-10-08 DIAGNOSIS — R062 Wheezing: Secondary | ICD-10-CM | POA: Diagnosis not present

## 2022-10-08 DIAGNOSIS — F319 Bipolar disorder, unspecified: Secondary | ICD-10-CM | POA: Diagnosis not present

## 2022-10-08 DIAGNOSIS — Z48812 Encounter for surgical aftercare following surgery on the circulatory system: Secondary | ICD-10-CM | POA: Diagnosis not present

## 2022-10-08 DIAGNOSIS — F29 Unspecified psychosis not due to a substance or known physiological condition: Secondary | ICD-10-CM | POA: Diagnosis not present

## 2022-10-08 DIAGNOSIS — F3132 Bipolar disorder, current episode depressed, moderate: Secondary | ICD-10-CM | POA: Diagnosis not present

## 2022-10-14 DIAGNOSIS — R4182 Altered mental status, unspecified: Secondary | ICD-10-CM | POA: Diagnosis not present

## 2022-10-14 DIAGNOSIS — H8110 Benign paroxysmal vertigo, unspecified ear: Secondary | ICD-10-CM | POA: Diagnosis not present

## 2022-10-14 DIAGNOSIS — R062 Wheezing: Secondary | ICD-10-CM | POA: Diagnosis not present

## 2022-10-14 DIAGNOSIS — F02C0 Dementia in other diseases classified elsewhere, severe, without behavioral disturbance, psychotic disturbance, mood disturbance, and anxiety: Secondary | ICD-10-CM | POA: Diagnosis not present

## 2022-10-14 DIAGNOSIS — I10 Essential (primary) hypertension: Secondary | ICD-10-CM | POA: Diagnosis not present

## 2022-10-14 DIAGNOSIS — M25531 Pain in right wrist: Secondary | ICD-10-CM | POA: Diagnosis not present

## 2022-10-14 DIAGNOSIS — E11649 Type 2 diabetes mellitus with hypoglycemia without coma: Secondary | ICD-10-CM | POA: Diagnosis not present

## 2022-10-14 DIAGNOSIS — E1169 Type 2 diabetes mellitus with other specified complication: Secondary | ICD-10-CM | POA: Diagnosis not present

## 2022-10-14 DIAGNOSIS — G300 Alzheimer's disease with early onset: Secondary | ICD-10-CM | POA: Diagnosis not present

## 2022-10-14 DIAGNOSIS — F319 Bipolar disorder, unspecified: Secondary | ICD-10-CM | POA: Diagnosis not present

## 2022-10-14 DIAGNOSIS — R600 Localized edema: Secondary | ICD-10-CM | POA: Diagnosis not present

## 2022-10-14 DIAGNOSIS — F3132 Bipolar disorder, current episode depressed, moderate: Secondary | ICD-10-CM | POA: Diagnosis not present

## 2022-10-14 DIAGNOSIS — E1165 Type 2 diabetes mellitus with hyperglycemia: Secondary | ICD-10-CM | POA: Diagnosis not present

## 2022-10-14 DIAGNOSIS — M6281 Muscle weakness (generalized): Secondary | ICD-10-CM | POA: Diagnosis not present

## 2022-10-14 DIAGNOSIS — N179 Acute kidney failure, unspecified: Secondary | ICD-10-CM | POA: Diagnosis not present

## 2022-10-14 DIAGNOSIS — Z982 Presence of cerebrospinal fluid drainage device: Secondary | ICD-10-CM | POA: Diagnosis not present

## 2022-10-14 DIAGNOSIS — R0981 Nasal congestion: Secondary | ICD-10-CM | POA: Diagnosis not present

## 2022-10-14 DIAGNOSIS — R400 Somnolence: Secondary | ICD-10-CM | POA: Diagnosis not present

## 2022-10-14 DIAGNOSIS — G6289 Other specified polyneuropathies: Secondary | ICD-10-CM | POA: Diagnosis not present

## 2022-10-14 DIAGNOSIS — G6281 Critical illness polyneuropathy: Secondary | ICD-10-CM | POA: Diagnosis not present

## 2022-10-14 DIAGNOSIS — Z48812 Encounter for surgical aftercare following surgery on the circulatory system: Secondary | ICD-10-CM | POA: Diagnosis not present

## 2022-10-14 DIAGNOSIS — E1110 Type 2 diabetes mellitus with ketoacidosis without coma: Secondary | ICD-10-CM | POA: Diagnosis not present

## 2022-10-14 DIAGNOSIS — F29 Unspecified psychosis not due to a substance or known physiological condition: Secondary | ICD-10-CM | POA: Diagnosis not present

## 2022-10-14 DIAGNOSIS — F02B3 Dementia in other diseases classified elsewhere, moderate, with mood disturbance: Secondary | ICD-10-CM | POA: Diagnosis not present

## 2022-10-14 DIAGNOSIS — J45909 Unspecified asthma, uncomplicated: Secondary | ICD-10-CM | POA: Diagnosis not present

## 2022-10-15 DIAGNOSIS — Z79899 Other long term (current) drug therapy: Secondary | ICD-10-CM | POA: Diagnosis not present

## 2022-10-15 DIAGNOSIS — E1165 Type 2 diabetes mellitus with hyperglycemia: Secondary | ICD-10-CM | POA: Diagnosis not present

## 2022-10-19 DIAGNOSIS — Z79899 Other long term (current) drug therapy: Secondary | ICD-10-CM | POA: Diagnosis not present

## 2022-10-19 DIAGNOSIS — E1165 Type 2 diabetes mellitus with hyperglycemia: Secondary | ICD-10-CM | POA: Diagnosis not present

## 2022-10-21 DIAGNOSIS — F29 Unspecified psychosis not due to a substance or known physiological condition: Secondary | ICD-10-CM | POA: Diagnosis not present

## 2022-10-21 DIAGNOSIS — R062 Wheezing: Secondary | ICD-10-CM | POA: Diagnosis not present

## 2022-10-21 DIAGNOSIS — F319 Bipolar disorder, unspecified: Secondary | ICD-10-CM | POA: Diagnosis not present

## 2022-10-21 DIAGNOSIS — E1165 Type 2 diabetes mellitus with hyperglycemia: Secondary | ICD-10-CM | POA: Diagnosis not present

## 2022-10-21 DIAGNOSIS — M25531 Pain in right wrist: Secondary | ICD-10-CM | POA: Diagnosis not present

## 2022-10-21 DIAGNOSIS — H8110 Benign paroxysmal vertigo, unspecified ear: Secondary | ICD-10-CM | POA: Diagnosis not present

## 2022-10-21 DIAGNOSIS — R600 Localized edema: Secondary | ICD-10-CM | POA: Diagnosis not present

## 2022-10-21 DIAGNOSIS — Z982 Presence of cerebrospinal fluid drainage device: Secondary | ICD-10-CM | POA: Diagnosis not present

## 2022-10-21 DIAGNOSIS — F02B3 Dementia in other diseases classified elsewhere, moderate, with mood disturbance: Secondary | ICD-10-CM | POA: Diagnosis not present

## 2022-10-21 DIAGNOSIS — R400 Somnolence: Secondary | ICD-10-CM | POA: Diagnosis not present

## 2022-10-21 DIAGNOSIS — G6289 Other specified polyneuropathies: Secondary | ICD-10-CM | POA: Diagnosis not present

## 2022-10-21 DIAGNOSIS — F02C0 Dementia in other diseases classified elsewhere, severe, without behavioral disturbance, psychotic disturbance, mood disturbance, and anxiety: Secondary | ICD-10-CM | POA: Diagnosis not present

## 2022-10-21 DIAGNOSIS — R4182 Altered mental status, unspecified: Secondary | ICD-10-CM | POA: Diagnosis not present

## 2022-10-21 DIAGNOSIS — E1110 Type 2 diabetes mellitus with ketoacidosis without coma: Secondary | ICD-10-CM | POA: Diagnosis not present

## 2022-10-21 DIAGNOSIS — N179 Acute kidney failure, unspecified: Secondary | ICD-10-CM | POA: Diagnosis not present

## 2022-10-21 DIAGNOSIS — J45909 Unspecified asthma, uncomplicated: Secondary | ICD-10-CM | POA: Diagnosis not present

## 2022-10-21 DIAGNOSIS — F3132 Bipolar disorder, current episode depressed, moderate: Secondary | ICD-10-CM | POA: Diagnosis not present

## 2022-10-21 DIAGNOSIS — Z48812 Encounter for surgical aftercare following surgery on the circulatory system: Secondary | ICD-10-CM | POA: Diagnosis not present

## 2022-10-21 DIAGNOSIS — R0981 Nasal congestion: Secondary | ICD-10-CM | POA: Diagnosis not present

## 2022-10-21 DIAGNOSIS — I10 Essential (primary) hypertension: Secondary | ICD-10-CM | POA: Diagnosis not present

## 2022-10-21 DIAGNOSIS — E11649 Type 2 diabetes mellitus with hypoglycemia without coma: Secondary | ICD-10-CM | POA: Diagnosis not present

## 2022-10-21 DIAGNOSIS — M6281 Muscle weakness (generalized): Secondary | ICD-10-CM | POA: Diagnosis not present

## 2022-10-21 DIAGNOSIS — G300 Alzheimer's disease with early onset: Secondary | ICD-10-CM | POA: Diagnosis not present

## 2022-10-21 DIAGNOSIS — E1169 Type 2 diabetes mellitus with other specified complication: Secondary | ICD-10-CM | POA: Diagnosis not present

## 2022-10-21 DIAGNOSIS — G6281 Critical illness polyneuropathy: Secondary | ICD-10-CM | POA: Diagnosis not present

## 2022-10-22 DIAGNOSIS — I10 Essential (primary) hypertension: Secondary | ICD-10-CM | POA: Diagnosis not present

## 2022-10-22 DIAGNOSIS — E1165 Type 2 diabetes mellitus with hyperglycemia: Secondary | ICD-10-CM | POA: Diagnosis not present

## 2022-10-22 DIAGNOSIS — E785 Hyperlipidemia, unspecified: Secondary | ICD-10-CM | POA: Diagnosis not present

## 2022-10-22 DIAGNOSIS — J449 Chronic obstructive pulmonary disease, unspecified: Secondary | ICD-10-CM | POA: Diagnosis not present

## 2022-10-22 DIAGNOSIS — K219 Gastro-esophageal reflux disease without esophagitis: Secondary | ICD-10-CM | POA: Diagnosis not present

## 2022-10-22 DIAGNOSIS — Z79899 Other long term (current) drug therapy: Secondary | ICD-10-CM | POA: Diagnosis not present

## 2022-10-28 DIAGNOSIS — G6281 Critical illness polyneuropathy: Secondary | ICD-10-CM | POA: Diagnosis not present

## 2022-10-28 DIAGNOSIS — G6289 Other specified polyneuropathies: Secondary | ICD-10-CM | POA: Diagnosis not present

## 2022-10-28 DIAGNOSIS — H8110 Benign paroxysmal vertigo, unspecified ear: Secondary | ICD-10-CM | POA: Diagnosis not present

## 2022-10-28 DIAGNOSIS — E11649 Type 2 diabetes mellitus with hypoglycemia without coma: Secondary | ICD-10-CM | POA: Diagnosis not present

## 2022-10-28 DIAGNOSIS — Z48812 Encounter for surgical aftercare following surgery on the circulatory system: Secondary | ICD-10-CM | POA: Diagnosis not present

## 2022-10-28 DIAGNOSIS — M6281 Muscle weakness (generalized): Secondary | ICD-10-CM | POA: Diagnosis not present

## 2022-10-28 DIAGNOSIS — Z982 Presence of cerebrospinal fluid drainage device: Secondary | ICD-10-CM | POA: Diagnosis not present

## 2022-10-28 DIAGNOSIS — E1169 Type 2 diabetes mellitus with other specified complication: Secondary | ICD-10-CM | POA: Diagnosis not present

## 2022-10-28 DIAGNOSIS — R400 Somnolence: Secondary | ICD-10-CM | POA: Diagnosis not present

## 2022-10-28 DIAGNOSIS — F319 Bipolar disorder, unspecified: Secondary | ICD-10-CM | POA: Diagnosis not present

## 2022-10-28 DIAGNOSIS — M25531 Pain in right wrist: Secondary | ICD-10-CM | POA: Diagnosis not present

## 2022-10-28 DIAGNOSIS — R0981 Nasal congestion: Secondary | ICD-10-CM | POA: Diagnosis not present

## 2022-10-28 DIAGNOSIS — J45909 Unspecified asthma, uncomplicated: Secondary | ICD-10-CM | POA: Diagnosis not present

## 2022-10-28 DIAGNOSIS — N179 Acute kidney failure, unspecified: Secondary | ICD-10-CM | POA: Diagnosis not present

## 2022-10-28 DIAGNOSIS — R4182 Altered mental status, unspecified: Secondary | ICD-10-CM | POA: Diagnosis not present

## 2022-10-28 DIAGNOSIS — F3132 Bipolar disorder, current episode depressed, moderate: Secondary | ICD-10-CM | POA: Diagnosis not present

## 2022-10-28 DIAGNOSIS — F02B3 Dementia in other diseases classified elsewhere, moderate, with mood disturbance: Secondary | ICD-10-CM | POA: Diagnosis not present

## 2022-10-28 DIAGNOSIS — F02C0 Dementia in other diseases classified elsewhere, severe, without behavioral disturbance, psychotic disturbance, mood disturbance, and anxiety: Secondary | ICD-10-CM | POA: Diagnosis not present

## 2022-10-28 DIAGNOSIS — E1165 Type 2 diabetes mellitus with hyperglycemia: Secondary | ICD-10-CM | POA: Diagnosis not present

## 2022-10-28 DIAGNOSIS — E1110 Type 2 diabetes mellitus with ketoacidosis without coma: Secondary | ICD-10-CM | POA: Diagnosis not present

## 2022-10-28 DIAGNOSIS — I10 Essential (primary) hypertension: Secondary | ICD-10-CM | POA: Diagnosis not present

## 2022-10-28 DIAGNOSIS — R062 Wheezing: Secondary | ICD-10-CM | POA: Diagnosis not present

## 2022-10-28 DIAGNOSIS — F29 Unspecified psychosis not due to a substance or known physiological condition: Secondary | ICD-10-CM | POA: Diagnosis not present

## 2022-10-28 DIAGNOSIS — G300 Alzheimer's disease with early onset: Secondary | ICD-10-CM | POA: Diagnosis not present

## 2022-10-28 DIAGNOSIS — R600 Localized edema: Secondary | ICD-10-CM | POA: Diagnosis not present

## 2022-10-29 DIAGNOSIS — F32A Depression, unspecified: Secondary | ICD-10-CM | POA: Diagnosis not present

## 2022-10-29 DIAGNOSIS — F419 Anxiety disorder, unspecified: Secondary | ICD-10-CM | POA: Diagnosis not present

## 2022-10-29 DIAGNOSIS — Z79899 Other long term (current) drug therapy: Secondary | ICD-10-CM | POA: Diagnosis not present

## 2022-11-05 DIAGNOSIS — Z79899 Other long term (current) drug therapy: Secondary | ICD-10-CM | POA: Diagnosis not present

## 2022-11-05 DIAGNOSIS — F419 Anxiety disorder, unspecified: Secondary | ICD-10-CM | POA: Diagnosis not present

## 2022-11-05 DIAGNOSIS — F319 Bipolar disorder, unspecified: Secondary | ICD-10-CM | POA: Diagnosis not present

## 2022-11-06 DIAGNOSIS — H8110 Benign paroxysmal vertigo, unspecified ear: Secondary | ICD-10-CM | POA: Diagnosis not present

## 2022-11-06 DIAGNOSIS — R0981 Nasal congestion: Secondary | ICD-10-CM | POA: Diagnosis not present

## 2022-11-06 DIAGNOSIS — Z48812 Encounter for surgical aftercare following surgery on the circulatory system: Secondary | ICD-10-CM | POA: Diagnosis not present

## 2022-11-06 DIAGNOSIS — M6281 Muscle weakness (generalized): Secondary | ICD-10-CM | POA: Diagnosis not present

## 2022-11-06 DIAGNOSIS — F02C0 Dementia in other diseases classified elsewhere, severe, without behavioral disturbance, psychotic disturbance, mood disturbance, and anxiety: Secondary | ICD-10-CM | POA: Diagnosis not present

## 2022-11-06 DIAGNOSIS — E1110 Type 2 diabetes mellitus with ketoacidosis without coma: Secondary | ICD-10-CM | POA: Diagnosis not present

## 2022-11-06 DIAGNOSIS — G6281 Critical illness polyneuropathy: Secondary | ICD-10-CM | POA: Diagnosis not present

## 2022-11-06 DIAGNOSIS — E11649 Type 2 diabetes mellitus with hypoglycemia without coma: Secondary | ICD-10-CM | POA: Diagnosis not present

## 2022-11-06 DIAGNOSIS — R4182 Altered mental status, unspecified: Secondary | ICD-10-CM | POA: Diagnosis not present

## 2022-11-06 DIAGNOSIS — Z982 Presence of cerebrospinal fluid drainage device: Secondary | ICD-10-CM | POA: Diagnosis not present

## 2022-11-06 DIAGNOSIS — E1169 Type 2 diabetes mellitus with other specified complication: Secondary | ICD-10-CM | POA: Diagnosis not present

## 2022-11-06 DIAGNOSIS — I10 Essential (primary) hypertension: Secondary | ICD-10-CM | POA: Diagnosis not present

## 2022-11-06 DIAGNOSIS — F319 Bipolar disorder, unspecified: Secondary | ICD-10-CM | POA: Diagnosis not present

## 2022-11-06 DIAGNOSIS — G6289 Other specified polyneuropathies: Secondary | ICD-10-CM | POA: Diagnosis not present

## 2022-11-06 DIAGNOSIS — F29 Unspecified psychosis not due to a substance or known physiological condition: Secondary | ICD-10-CM | POA: Diagnosis not present

## 2022-11-06 DIAGNOSIS — F3132 Bipolar disorder, current episode depressed, moderate: Secondary | ICD-10-CM | POA: Diagnosis not present

## 2022-11-06 DIAGNOSIS — G300 Alzheimer's disease with early onset: Secondary | ICD-10-CM | POA: Diagnosis not present

## 2022-11-06 DIAGNOSIS — E1165 Type 2 diabetes mellitus with hyperglycemia: Secondary | ICD-10-CM | POA: Diagnosis not present

## 2022-11-06 DIAGNOSIS — F02B3 Dementia in other diseases classified elsewhere, moderate, with mood disturbance: Secondary | ICD-10-CM | POA: Diagnosis not present

## 2022-11-06 DIAGNOSIS — R600 Localized edema: Secondary | ICD-10-CM | POA: Diagnosis not present

## 2022-11-06 DIAGNOSIS — R062 Wheezing: Secondary | ICD-10-CM | POA: Diagnosis not present

## 2022-11-06 DIAGNOSIS — R400 Somnolence: Secondary | ICD-10-CM | POA: Diagnosis not present

## 2022-11-06 DIAGNOSIS — N179 Acute kidney failure, unspecified: Secondary | ICD-10-CM | POA: Diagnosis not present

## 2022-11-06 DIAGNOSIS — J45909 Unspecified asthma, uncomplicated: Secondary | ICD-10-CM | POA: Diagnosis not present

## 2022-11-06 DIAGNOSIS — M25531 Pain in right wrist: Secondary | ICD-10-CM | POA: Diagnosis not present

## 2022-11-09 DIAGNOSIS — Z79899 Other long term (current) drug therapy: Secondary | ICD-10-CM | POA: Diagnosis not present

## 2022-11-09 DIAGNOSIS — J45909 Unspecified asthma, uncomplicated: Secondary | ICD-10-CM | POA: Diagnosis not present

## 2022-11-09 DIAGNOSIS — F419 Anxiety disorder, unspecified: Secondary | ICD-10-CM | POA: Diagnosis not present

## 2022-11-09 DIAGNOSIS — E1165 Type 2 diabetes mellitus with hyperglycemia: Secondary | ICD-10-CM | POA: Diagnosis not present

## 2022-11-11 DIAGNOSIS — H8110 Benign paroxysmal vertigo, unspecified ear: Secondary | ICD-10-CM | POA: Diagnosis not present

## 2022-11-11 DIAGNOSIS — J45909 Unspecified asthma, uncomplicated: Secondary | ICD-10-CM | POA: Diagnosis not present

## 2022-11-11 DIAGNOSIS — F02C0 Dementia in other diseases classified elsewhere, severe, without behavioral disturbance, psychotic disturbance, mood disturbance, and anxiety: Secondary | ICD-10-CM | POA: Diagnosis not present

## 2022-11-11 DIAGNOSIS — G300 Alzheimer's disease with early onset: Secondary | ICD-10-CM | POA: Diagnosis not present

## 2022-11-11 DIAGNOSIS — F02B3 Dementia in other diseases classified elsewhere, moderate, with mood disturbance: Secondary | ICD-10-CM | POA: Diagnosis not present

## 2022-11-11 DIAGNOSIS — E1110 Type 2 diabetes mellitus with ketoacidosis without coma: Secondary | ICD-10-CM | POA: Diagnosis not present

## 2022-11-11 DIAGNOSIS — M25531 Pain in right wrist: Secondary | ICD-10-CM | POA: Diagnosis not present

## 2022-11-11 DIAGNOSIS — N179 Acute kidney failure, unspecified: Secondary | ICD-10-CM | POA: Diagnosis not present

## 2022-11-11 DIAGNOSIS — R062 Wheezing: Secondary | ICD-10-CM | POA: Diagnosis not present

## 2022-11-11 DIAGNOSIS — E11649 Type 2 diabetes mellitus with hypoglycemia without coma: Secondary | ICD-10-CM | POA: Diagnosis not present

## 2022-11-11 DIAGNOSIS — Z48812 Encounter for surgical aftercare following surgery on the circulatory system: Secondary | ICD-10-CM | POA: Diagnosis not present

## 2022-11-11 DIAGNOSIS — I10 Essential (primary) hypertension: Secondary | ICD-10-CM | POA: Diagnosis not present

## 2022-11-11 DIAGNOSIS — R0981 Nasal congestion: Secondary | ICD-10-CM | POA: Diagnosis not present

## 2022-11-11 DIAGNOSIS — G6281 Critical illness polyneuropathy: Secondary | ICD-10-CM | POA: Diagnosis not present

## 2022-11-11 DIAGNOSIS — E1169 Type 2 diabetes mellitus with other specified complication: Secondary | ICD-10-CM | POA: Diagnosis not present

## 2022-11-11 DIAGNOSIS — F3132 Bipolar disorder, current episode depressed, moderate: Secondary | ICD-10-CM | POA: Diagnosis not present

## 2022-11-11 DIAGNOSIS — E1165 Type 2 diabetes mellitus with hyperglycemia: Secondary | ICD-10-CM | POA: Diagnosis not present

## 2022-11-11 DIAGNOSIS — R4182 Altered mental status, unspecified: Secondary | ICD-10-CM | POA: Diagnosis not present

## 2022-11-11 DIAGNOSIS — F29 Unspecified psychosis not due to a substance or known physiological condition: Secondary | ICD-10-CM | POA: Diagnosis not present

## 2022-11-11 DIAGNOSIS — Z982 Presence of cerebrospinal fluid drainage device: Secondary | ICD-10-CM | POA: Diagnosis not present

## 2022-11-11 DIAGNOSIS — F319 Bipolar disorder, unspecified: Secondary | ICD-10-CM | POA: Diagnosis not present

## 2022-11-11 DIAGNOSIS — R400 Somnolence: Secondary | ICD-10-CM | POA: Diagnosis not present

## 2022-11-11 DIAGNOSIS — G6289 Other specified polyneuropathies: Secondary | ICD-10-CM | POA: Diagnosis not present

## 2022-11-11 DIAGNOSIS — M6281 Muscle weakness (generalized): Secondary | ICD-10-CM | POA: Diagnosis not present

## 2022-11-11 DIAGNOSIS — N181 Chronic kidney disease, stage 1: Secondary | ICD-10-CM | POA: Diagnosis not present

## 2022-11-17 DIAGNOSIS — E875 Hyperkalemia: Secondary | ICD-10-CM | POA: Diagnosis not present

## 2022-11-18 DIAGNOSIS — I10 Essential (primary) hypertension: Secondary | ICD-10-CM | POA: Diagnosis not present

## 2022-11-18 DIAGNOSIS — R0981 Nasal congestion: Secondary | ICD-10-CM | POA: Diagnosis not present

## 2022-11-18 DIAGNOSIS — F3132 Bipolar disorder, current episode depressed, moderate: Secondary | ICD-10-CM | POA: Diagnosis not present

## 2022-11-18 DIAGNOSIS — J45909 Unspecified asthma, uncomplicated: Secondary | ICD-10-CM | POA: Diagnosis not present

## 2022-11-18 DIAGNOSIS — R062 Wheezing: Secondary | ICD-10-CM | POA: Diagnosis not present

## 2022-11-18 DIAGNOSIS — F02C0 Dementia in other diseases classified elsewhere, severe, without behavioral disturbance, psychotic disturbance, mood disturbance, and anxiety: Secondary | ICD-10-CM | POA: Diagnosis not present

## 2022-11-18 DIAGNOSIS — F419 Anxiety disorder, unspecified: Secondary | ICD-10-CM | POA: Diagnosis not present

## 2022-11-18 DIAGNOSIS — F319 Bipolar disorder, unspecified: Secondary | ICD-10-CM | POA: Diagnosis not present

## 2022-11-18 DIAGNOSIS — E1110 Type 2 diabetes mellitus with ketoacidosis without coma: Secondary | ICD-10-CM | POA: Diagnosis not present

## 2022-11-18 DIAGNOSIS — R4182 Altered mental status, unspecified: Secondary | ICD-10-CM | POA: Diagnosis not present

## 2022-11-18 DIAGNOSIS — Z982 Presence of cerebrospinal fluid drainage device: Secondary | ICD-10-CM | POA: Diagnosis not present

## 2022-11-18 DIAGNOSIS — F02B3 Dementia in other diseases classified elsewhere, moderate, with mood disturbance: Secondary | ICD-10-CM | POA: Diagnosis not present

## 2022-11-18 DIAGNOSIS — G6281 Critical illness polyneuropathy: Secondary | ICD-10-CM | POA: Diagnosis not present

## 2022-11-18 DIAGNOSIS — E11649 Type 2 diabetes mellitus with hypoglycemia without coma: Secondary | ICD-10-CM | POA: Diagnosis not present

## 2022-11-18 DIAGNOSIS — H8110 Benign paroxysmal vertigo, unspecified ear: Secondary | ICD-10-CM | POA: Diagnosis not present

## 2022-11-18 DIAGNOSIS — Z48812 Encounter for surgical aftercare following surgery on the circulatory system: Secondary | ICD-10-CM | POA: Diagnosis not present

## 2022-11-18 DIAGNOSIS — E1165 Type 2 diabetes mellitus with hyperglycemia: Secondary | ICD-10-CM | POA: Diagnosis not present

## 2022-11-18 DIAGNOSIS — E785 Hyperlipidemia, unspecified: Secondary | ICD-10-CM | POA: Diagnosis not present

## 2022-11-18 DIAGNOSIS — E1169 Type 2 diabetes mellitus with other specified complication: Secondary | ICD-10-CM | POA: Diagnosis not present

## 2022-11-18 DIAGNOSIS — F29 Unspecified psychosis not due to a substance or known physiological condition: Secondary | ICD-10-CM | POA: Diagnosis not present

## 2022-11-18 DIAGNOSIS — M6281 Muscle weakness (generalized): Secondary | ICD-10-CM | POA: Diagnosis not present

## 2022-11-18 DIAGNOSIS — G300 Alzheimer's disease with early onset: Secondary | ICD-10-CM | POA: Diagnosis not present

## 2022-11-18 DIAGNOSIS — K219 Gastro-esophageal reflux disease without esophagitis: Secondary | ICD-10-CM | POA: Diagnosis not present

## 2022-11-18 DIAGNOSIS — M25531 Pain in right wrist: Secondary | ICD-10-CM | POA: Diagnosis not present

## 2022-11-18 DIAGNOSIS — N181 Chronic kidney disease, stage 1: Secondary | ICD-10-CM | POA: Diagnosis not present

## 2022-11-18 DIAGNOSIS — N179 Acute kidney failure, unspecified: Secondary | ICD-10-CM | POA: Diagnosis not present

## 2022-11-18 DIAGNOSIS — J449 Chronic obstructive pulmonary disease, unspecified: Secondary | ICD-10-CM | POA: Diagnosis not present

## 2022-11-18 DIAGNOSIS — G6289 Other specified polyneuropathies: Secondary | ICD-10-CM | POA: Diagnosis not present

## 2022-11-18 DIAGNOSIS — R400 Somnolence: Secondary | ICD-10-CM | POA: Diagnosis not present

## 2022-11-25 DIAGNOSIS — M25531 Pain in right wrist: Secondary | ICD-10-CM | POA: Diagnosis not present

## 2022-11-25 DIAGNOSIS — N181 Chronic kidney disease, stage 1: Secondary | ICD-10-CM | POA: Diagnosis not present

## 2022-11-25 DIAGNOSIS — E1165 Type 2 diabetes mellitus with hyperglycemia: Secondary | ICD-10-CM | POA: Diagnosis not present

## 2022-11-25 DIAGNOSIS — F02C0 Dementia in other diseases classified elsewhere, severe, without behavioral disturbance, psychotic disturbance, mood disturbance, and anxiety: Secondary | ICD-10-CM | POA: Diagnosis not present

## 2022-11-25 DIAGNOSIS — Z982 Presence of cerebrospinal fluid drainage device: Secondary | ICD-10-CM | POA: Diagnosis not present

## 2022-11-25 DIAGNOSIS — N179 Acute kidney failure, unspecified: Secondary | ICD-10-CM | POA: Diagnosis not present

## 2022-11-25 DIAGNOSIS — I10 Essential (primary) hypertension: Secondary | ICD-10-CM | POA: Diagnosis not present

## 2022-11-25 DIAGNOSIS — R062 Wheezing: Secondary | ICD-10-CM | POA: Diagnosis not present

## 2022-11-25 DIAGNOSIS — M6281 Muscle weakness (generalized): Secondary | ICD-10-CM | POA: Diagnosis not present

## 2022-11-25 DIAGNOSIS — F319 Bipolar disorder, unspecified: Secondary | ICD-10-CM | POA: Diagnosis not present

## 2022-11-25 DIAGNOSIS — F02B3 Dementia in other diseases classified elsewhere, moderate, with mood disturbance: Secondary | ICD-10-CM | POA: Diagnosis not present

## 2022-11-25 DIAGNOSIS — J45909 Unspecified asthma, uncomplicated: Secondary | ICD-10-CM | POA: Diagnosis not present

## 2022-11-25 DIAGNOSIS — E11649 Type 2 diabetes mellitus with hypoglycemia without coma: Secondary | ICD-10-CM | POA: Diagnosis not present

## 2022-11-25 DIAGNOSIS — E1110 Type 2 diabetes mellitus with ketoacidosis without coma: Secondary | ICD-10-CM | POA: Diagnosis not present

## 2022-11-25 DIAGNOSIS — R0981 Nasal congestion: Secondary | ICD-10-CM | POA: Diagnosis not present

## 2022-11-25 DIAGNOSIS — R400 Somnolence: Secondary | ICD-10-CM | POA: Diagnosis not present

## 2022-11-25 DIAGNOSIS — G6289 Other specified polyneuropathies: Secondary | ICD-10-CM | POA: Diagnosis not present

## 2022-11-25 DIAGNOSIS — Z48812 Encounter for surgical aftercare following surgery on the circulatory system: Secondary | ICD-10-CM | POA: Diagnosis not present

## 2022-11-25 DIAGNOSIS — E875 Hyperkalemia: Secondary | ICD-10-CM | POA: Diagnosis not present

## 2022-11-25 DIAGNOSIS — G300 Alzheimer's disease with early onset: Secondary | ICD-10-CM | POA: Diagnosis not present

## 2022-11-25 DIAGNOSIS — E1169 Type 2 diabetes mellitus with other specified complication: Secondary | ICD-10-CM | POA: Diagnosis not present

## 2022-11-25 DIAGNOSIS — G6281 Critical illness polyneuropathy: Secondary | ICD-10-CM | POA: Diagnosis not present

## 2022-11-25 DIAGNOSIS — F29 Unspecified psychosis not due to a substance or known physiological condition: Secondary | ICD-10-CM | POA: Diagnosis not present

## 2022-11-25 DIAGNOSIS — H8110 Benign paroxysmal vertigo, unspecified ear: Secondary | ICD-10-CM | POA: Diagnosis not present

## 2022-11-25 DIAGNOSIS — F3132 Bipolar disorder, current episode depressed, moderate: Secondary | ICD-10-CM | POA: Diagnosis not present

## 2022-11-25 DIAGNOSIS — R4182 Altered mental status, unspecified: Secondary | ICD-10-CM | POA: Diagnosis not present

## 2022-11-26 DIAGNOSIS — F419 Anxiety disorder, unspecified: Secondary | ICD-10-CM | POA: Diagnosis not present

## 2022-11-26 DIAGNOSIS — Z79899 Other long term (current) drug therapy: Secondary | ICD-10-CM | POA: Diagnosis not present

## 2022-11-30 DIAGNOSIS — R52 Pain, unspecified: Secondary | ICD-10-CM | POA: Diagnosis not present

## 2022-11-30 DIAGNOSIS — Z79899 Other long term (current) drug therapy: Secondary | ICD-10-CM | POA: Diagnosis not present

## 2022-12-02 DIAGNOSIS — F02C0 Dementia in other diseases classified elsewhere, severe, without behavioral disturbance, psychotic disturbance, mood disturbance, and anxiety: Secondary | ICD-10-CM | POA: Diagnosis not present

## 2022-12-02 DIAGNOSIS — F29 Unspecified psychosis not due to a substance or known physiological condition: Secondary | ICD-10-CM | POA: Diagnosis not present

## 2022-12-02 DIAGNOSIS — R4182 Altered mental status, unspecified: Secondary | ICD-10-CM | POA: Diagnosis not present

## 2022-12-02 DIAGNOSIS — E1165 Type 2 diabetes mellitus with hyperglycemia: Secondary | ICD-10-CM | POA: Diagnosis not present

## 2022-12-02 DIAGNOSIS — F319 Bipolar disorder, unspecified: Secondary | ICD-10-CM | POA: Diagnosis not present

## 2022-12-02 DIAGNOSIS — J45909 Unspecified asthma, uncomplicated: Secondary | ICD-10-CM | POA: Diagnosis not present

## 2022-12-02 DIAGNOSIS — M25531 Pain in right wrist: Secondary | ICD-10-CM | POA: Diagnosis not present

## 2022-12-02 DIAGNOSIS — F02B3 Dementia in other diseases classified elsewhere, moderate, with mood disturbance: Secondary | ICD-10-CM | POA: Diagnosis not present

## 2022-12-02 DIAGNOSIS — I10 Essential (primary) hypertension: Secondary | ICD-10-CM | POA: Diagnosis not present

## 2022-12-02 DIAGNOSIS — F3132 Bipolar disorder, current episode depressed, moderate: Secondary | ICD-10-CM | POA: Diagnosis not present

## 2022-12-02 DIAGNOSIS — R400 Somnolence: Secondary | ICD-10-CM | POA: Diagnosis not present

## 2022-12-02 DIAGNOSIS — Z982 Presence of cerebrospinal fluid drainage device: Secondary | ICD-10-CM | POA: Diagnosis not present

## 2022-12-02 DIAGNOSIS — N179 Acute kidney failure, unspecified: Secondary | ICD-10-CM | POA: Diagnosis not present

## 2022-12-02 DIAGNOSIS — E1169 Type 2 diabetes mellitus with other specified complication: Secondary | ICD-10-CM | POA: Diagnosis not present

## 2022-12-02 DIAGNOSIS — M6281 Muscle weakness (generalized): Secondary | ICD-10-CM | POA: Diagnosis not present

## 2022-12-02 DIAGNOSIS — E1110 Type 2 diabetes mellitus with ketoacidosis without coma: Secondary | ICD-10-CM | POA: Diagnosis not present

## 2022-12-02 DIAGNOSIS — E11649 Type 2 diabetes mellitus with hypoglycemia without coma: Secondary | ICD-10-CM | POA: Diagnosis not present

## 2022-12-02 DIAGNOSIS — H8110 Benign paroxysmal vertigo, unspecified ear: Secondary | ICD-10-CM | POA: Diagnosis not present

## 2022-12-02 DIAGNOSIS — G6289 Other specified polyneuropathies: Secondary | ICD-10-CM | POA: Diagnosis not present

## 2022-12-02 DIAGNOSIS — R0981 Nasal congestion: Secondary | ICD-10-CM | POA: Diagnosis not present

## 2022-12-02 DIAGNOSIS — G300 Alzheimer's disease with early onset: Secondary | ICD-10-CM | POA: Diagnosis not present

## 2022-12-02 DIAGNOSIS — G6281 Critical illness polyneuropathy: Secondary | ICD-10-CM | POA: Diagnosis not present

## 2022-12-02 DIAGNOSIS — N181 Chronic kidney disease, stage 1: Secondary | ICD-10-CM | POA: Diagnosis not present

## 2022-12-02 DIAGNOSIS — Z48812 Encounter for surgical aftercare following surgery on the circulatory system: Secondary | ICD-10-CM | POA: Diagnosis not present

## 2022-12-02 DIAGNOSIS — R062 Wheezing: Secondary | ICD-10-CM | POA: Diagnosis not present

## 2022-12-03 DIAGNOSIS — R52 Pain, unspecified: Secondary | ICD-10-CM | POA: Diagnosis not present

## 2022-12-03 DIAGNOSIS — Z79899 Other long term (current) drug therapy: Secondary | ICD-10-CM | POA: Diagnosis not present

## 2022-12-07 ENCOUNTER — Encounter: Payer: Self-pay | Admitting: "Endocrinology

## 2022-12-07 DIAGNOSIS — G6289 Other specified polyneuropathies: Secondary | ICD-10-CM | POA: Diagnosis not present

## 2022-12-07 DIAGNOSIS — M6281 Muscle weakness (generalized): Secondary | ICD-10-CM | POA: Diagnosis not present

## 2022-12-07 DIAGNOSIS — N179 Acute kidney failure, unspecified: Secondary | ICD-10-CM | POA: Diagnosis not present

## 2022-12-07 DIAGNOSIS — F02C0 Dementia in other diseases classified elsewhere, severe, without behavioral disturbance, psychotic disturbance, mood disturbance, and anxiety: Secondary | ICD-10-CM | POA: Diagnosis not present

## 2022-12-07 DIAGNOSIS — E11649 Type 2 diabetes mellitus with hypoglycemia without coma: Secondary | ICD-10-CM | POA: Diagnosis not present

## 2022-12-07 DIAGNOSIS — E1169 Type 2 diabetes mellitus with other specified complication: Secondary | ICD-10-CM | POA: Diagnosis not present

## 2022-12-07 DIAGNOSIS — G300 Alzheimer's disease with early onset: Secondary | ICD-10-CM | POA: Diagnosis not present

## 2022-12-07 DIAGNOSIS — E1165 Type 2 diabetes mellitus with hyperglycemia: Secondary | ICD-10-CM | POA: Diagnosis not present

## 2022-12-07 DIAGNOSIS — Z48812 Encounter for surgical aftercare following surgery on the circulatory system: Secondary | ICD-10-CM | POA: Diagnosis not present

## 2022-12-07 DIAGNOSIS — G6281 Critical illness polyneuropathy: Secondary | ICD-10-CM | POA: Diagnosis not present

## 2022-12-07 DIAGNOSIS — R4182 Altered mental status, unspecified: Secondary | ICD-10-CM | POA: Diagnosis not present

## 2022-12-07 DIAGNOSIS — M25531 Pain in right wrist: Secondary | ICD-10-CM | POA: Diagnosis not present

## 2022-12-07 DIAGNOSIS — N181 Chronic kidney disease, stage 1: Secondary | ICD-10-CM | POA: Diagnosis not present

## 2022-12-07 DIAGNOSIS — E1110 Type 2 diabetes mellitus with ketoacidosis without coma: Secondary | ICD-10-CM | POA: Diagnosis not present

## 2022-12-07 DIAGNOSIS — F3132 Bipolar disorder, current episode depressed, moderate: Secondary | ICD-10-CM | POA: Diagnosis not present

## 2022-12-07 DIAGNOSIS — F02B3 Dementia in other diseases classified elsewhere, moderate, with mood disturbance: Secondary | ICD-10-CM | POA: Diagnosis not present

## 2022-12-07 DIAGNOSIS — R0981 Nasal congestion: Secondary | ICD-10-CM | POA: Diagnosis not present

## 2022-12-07 DIAGNOSIS — J45909 Unspecified asthma, uncomplicated: Secondary | ICD-10-CM | POA: Diagnosis not present

## 2022-12-07 DIAGNOSIS — I10 Essential (primary) hypertension: Secondary | ICD-10-CM | POA: Diagnosis not present

## 2022-12-07 DIAGNOSIS — F29 Unspecified psychosis not due to a substance or known physiological condition: Secondary | ICD-10-CM | POA: Diagnosis not present

## 2022-12-07 DIAGNOSIS — F319 Bipolar disorder, unspecified: Secondary | ICD-10-CM | POA: Diagnosis not present

## 2022-12-07 DIAGNOSIS — R400 Somnolence: Secondary | ICD-10-CM | POA: Diagnosis not present

## 2022-12-07 DIAGNOSIS — Z982 Presence of cerebrospinal fluid drainage device: Secondary | ICD-10-CM | POA: Diagnosis not present

## 2022-12-07 DIAGNOSIS — R062 Wheezing: Secondary | ICD-10-CM | POA: Diagnosis not present

## 2022-12-07 DIAGNOSIS — H8110 Benign paroxysmal vertigo, unspecified ear: Secondary | ICD-10-CM | POA: Diagnosis not present

## 2022-12-08 ENCOUNTER — Ambulatory Visit: Payer: Medicaid Other | Admitting: "Endocrinology

## 2022-12-08 NOTE — Progress Notes (Signed)
I have called [facility] numerous times- I spoke with Aldona Bar, a supervisor and asked that [facility] be faxed to me, she said she would. I did not receive the MAR.  I have called [facility] several more times, no answer at the reception desk, no answer at the unit desk. I was transferred to the unit, a nurse picked up, but not patient's nurse, I was placed on hold for 10 mins, I hung up and called back, this time no answer at the unit desk. The receptionist said she would paged the nurse, Jodie answered, she said she is the patient's nurse. I asked forMs [facility] MAR, Jodie said t hat is not her job, she would have someone else send it; I heard someone in the back ground say it is your job Investment banker, corporate) to fax the [facility]. Jodie said ok I will l send it to you, I said wait I need to give you my fax #.  Medication record was complete by Pharmacy call person.  I completed the Pre- Procedure Instruction sheet.  I called and asked to speak with Ms [facility] nurse.  Patient's nurse, Caryl Pina answered, I obtained the fax number and I gave Caryl Pina my phone to call to confirm that she received instructions and that I listed questions I have. Caryl Pina said she would be available between 0930 and 1045.   I called - no answer at the SNF.  I spoke with Caryl Pina, Ms [facility] nurse,  there were no questions regarding instructions. Caryl Pina reports that patient's CBGs run all over the place, 300's at times.  Caryl Pina states that Ms Yoho's blood pressure is stable.

## 2022-12-08 NOTE — Progress Notes (Signed)
[name]  procedure is scheduled on Friday, April 5.  Report to [facility], Main Entrance or Entrance "A" at 8:15 AM.        For questions on the morning of surgery , call the pre- surgery desk - [phone]  >>>>>Please send patient's Medication Record with medications administrated documentation. ( this information is required prior to OR. This includes medications that may have been on hold for surgery)<<<<<   [name] is not  not eat or drink after midnight, on Thursday April 4.  Take these medicines the morning of surgery with A SIP OF WATER: Amlodipine Depakote Pepcid Metoprolol Sertraline  Prn: Tylenol Xanax Levalbuterol inhaler Tramadol  WHAT DO I DO ABOUT MY DIABETES MEDICATION?  Continue to hold Trulicty as instructed.  Do not take oral diabetes medicines (pills) the morning of surgery.  THE NIGHT BEFORE SURGERY, take Levemir 7units of Levemir Insulin.     THE MORNING OF SURGERY, take 5 units of Levemir insulin, If CBG is greater than 70.    If CBG is greater than 220, give 3 units of Humalog.  Check  [name]'s blood sugar the morning of your surgery when you wake up and every 2 hours until you get to the Short Stay unit. If your blood sugar is less than 70 mg/dL, you will need to treat for low blood sugar: Do not take insulin. Treat a low blood sugar (less than 70 mg/dL) with  cup of clear juice (cranberry or apple), 4 glucose tablets, OR glucose gel.  Recheck blood sugar in 15 minutes after treatment (to make sure it is greater than 70 mg/dL). If your blood sugar is not greater than 70 mg/dL on recheck, call [phone]  for further instructions. Report your blood sugar to the short stay nurse when you get to Short Stay.  Ms Scheiner should have a shower, with antibacteria soap, the morning of surgery . Dry off with a clean towel.  Patient should not have lotions, powders, colognes, deodorant, jewelry, or piercing's. Wear clean  comfortable clothes.  Brush teeth.  Glasses, Contacts, Hearing Aids, Dentures or partials  are allowed in surgery, if you wear them to the hospital, bring  cases to put them in, except Dentures, we have denture cups.  [facility] is not responsible for valuables or personal belongings.

## 2022-12-08 NOTE — H&P (Signed)
  Reconsult. Patient scheduled for surgery 01/23/2022 but pt refused tx.   CC: Bad teeth  Past Medical History:  High Blood Pressure, Asthma, Hypercholesterolemia, Diabetes, Drug Abuse/Alcohol Abuse, Mental Health problems, CKD, Alzheimers Disease, Vertigo, Hydrocephaly, Myopathy, COPD, GERD    Medications: Admelog, Acetaminophen, Amlodipine, Atorvastatin, Bisacodyl, Depakote, Dulera, GlycoLax Powder, Ipratropium albuterol, Metformin HCL, Metoprolol, Semglee, multi-vitamin, Glargine/Human, senna plus, Ventolin, MCG Albuterol, Zyprexa, Olanzapine, Divalproex, Famotidine, Humalog, Latuda, Mirtazapine, polyethylene glycol powder, sennosides, Tramadol, Trulicity, Tylenol    Allergies:     Erythromycin    Surgeries:   Drainage hydrocephaly, Stent placed     Social History       Smoking: n          Alcohol:n Drug use:n                             Exam: BMI  26. Multiple caries teeth # 2, 6, 7, 8, 9, 10, 11, 13, 14, 20, 23, 24, 25, 26, 27, 28.  No purulence, edema, fluctuance, trismus. Oral cancer screening negative. Pharynx clear. No lymphadenopathy.  Panorex:Multiple caries teeth # 2, 6, 7, 8, 9, 10, 11, 13, 14, 20, 23, 24, 25, 26, 27, 28.   Assessment: ASA 3. Non-restorable teeth # 2, 6, 7, 8, 9, 10, 11, 13, 14, 20, 23, 24, 25, 26, 27, 28.                 Plan: Extraction Teeth # 2, 6, 7, 8, 9, 10, 11, 13, 14, 20, 23, 24, 25, 26, 27, 28. Alveoloplasty.     Hospital Day surgery.                 Rx: n               Risks and complications explained. Questions answered.   Gae Bon, DMD

## 2022-12-08 NOTE — Progress Notes (Signed)
I have called 3 times and have not been able to speak to Ms [facility] nurse. I called and informed the receptionists that no one has answered, she then faxed me to a supervisor, Aldona Bar.  I asked Aldona Bar to fax me the Medication record.

## 2022-12-09 DIAGNOSIS — N181 Chronic kidney disease, stage 1: Secondary | ICD-10-CM | POA: Diagnosis not present

## 2022-12-09 DIAGNOSIS — E875 Hyperkalemia: Secondary | ICD-10-CM | POA: Diagnosis not present

## 2022-12-09 DIAGNOSIS — I739 Peripheral vascular disease, unspecified: Secondary | ICD-10-CM | POA: Diagnosis not present

## 2022-12-09 DIAGNOSIS — E1165 Type 2 diabetes mellitus with hyperglycemia: Secondary | ICD-10-CM | POA: Diagnosis not present

## 2022-12-10 ENCOUNTER — Encounter (HOSPITAL_COMMUNITY): Payer: Self-pay | Admitting: Oral Surgery

## 2022-12-11 ENCOUNTER — Other Ambulatory Visit: Payer: Self-pay

## 2022-12-11 ENCOUNTER — Encounter (HOSPITAL_COMMUNITY): Payer: Self-pay | Admitting: Oral Surgery

## 2022-12-11 ENCOUNTER — Ambulatory Visit (HOSPITAL_COMMUNITY)
Admission: RE | Admit: 2022-12-11 | Discharge: 2022-12-11 | Disposition: A | Discharge status: Skilled Nursing Facility | Payer: Medicaid Other | Attending: Oral Surgery | Admitting: Oral Surgery

## 2022-12-11 ENCOUNTER — Ambulatory Visit (HOSPITAL_COMMUNITY): Payer: Medicaid Other | Admitting: Certified Registered Nurse Anesthetist

## 2022-12-11 ENCOUNTER — Ambulatory Visit (HOSPITAL_BASED_OUTPATIENT_CLINIC_OR_DEPARTMENT_OTHER): Payer: Medicaid Other | Admitting: Certified Registered Nurse Anesthetist

## 2022-12-11 ENCOUNTER — Encounter (HOSPITAL_COMMUNITY)
Admission: RE | Disposition: A | Discharge status: Skilled Nursing Facility | Payer: Self-pay | Source: Home / Self Care | Attending: Oral Surgery

## 2022-12-11 DIAGNOSIS — Z7984 Long term (current) use of oral hypoglycemic drugs: Secondary | ICD-10-CM | POA: Insufficient documentation

## 2022-12-11 DIAGNOSIS — I129 Hypertensive chronic kidney disease with stage 1 through stage 4 chronic kidney disease, or unspecified chronic kidney disease: Secondary | ICD-10-CM | POA: Insufficient documentation

## 2022-12-11 DIAGNOSIS — K0889 Other specified disorders of teeth and supporting structures: Secondary | ICD-10-CM | POA: Diagnosis not present

## 2022-12-11 DIAGNOSIS — Z87891 Personal history of nicotine dependence: Secondary | ICD-10-CM | POA: Diagnosis not present

## 2022-12-11 DIAGNOSIS — K0850 Unsatisfactory restoration of tooth, unspecified: Secondary | ICD-10-CM

## 2022-12-11 DIAGNOSIS — N189 Chronic kidney disease, unspecified: Secondary | ICD-10-CM | POA: Diagnosis not present

## 2022-12-11 DIAGNOSIS — K029 Dental caries, unspecified: Secondary | ICD-10-CM | POA: Diagnosis not present

## 2022-12-11 DIAGNOSIS — Z794 Long term (current) use of insulin: Secondary | ICD-10-CM | POA: Diagnosis not present

## 2022-12-11 DIAGNOSIS — J45909 Unspecified asthma, uncomplicated: Secondary | ICD-10-CM | POA: Diagnosis not present

## 2022-12-11 DIAGNOSIS — I10 Essential (primary) hypertension: Secondary | ICD-10-CM | POA: Diagnosis not present

## 2022-12-11 DIAGNOSIS — E1122 Type 2 diabetes mellitus with diabetic chronic kidney disease: Secondary | ICD-10-CM | POA: Diagnosis not present

## 2022-12-11 HISTORY — PX: TOOTH EXTRACTION: SHX859

## 2022-12-11 LAB — GLUCOSE, CAPILLARY
Glucose-Capillary: 68 mg/dL — ABNORMAL LOW (ref 70–99)
Glucose-Capillary: 112 mg/dL — ABNORMAL HIGH (ref 70–99)
Glucose-Capillary: 115 mg/dL — ABNORMAL HIGH (ref 70–99)
Glucose-Capillary: 68 mg/dL — ABNORMAL LOW (ref 70–99)
Glucose-Capillary: 137 mg/dL — ABNORMAL HIGH (ref 70–99)

## 2022-12-11 SURGERY — DENTAL RESTORATION/EXTRACTIONS
Anesthesia: General

## 2022-12-11 MED ORDER — 0.9 % SODIUM CHLORIDE (POUR BTL) OPTIME
TOPICAL | Status: DC | PRN
  Administered 2022-12-11: 1000 mL

## 2022-12-11 MED ORDER — ROCURONIUM BROMIDE 10 MG/ML (PF) SYRINGE
PREFILLED_SYRINGE | INTRAVENOUS | Status: DC | PRN
  Administered 2022-12-11: 40 mg via INTRAVENOUS

## 2022-12-11 MED ORDER — LACTATED RINGERS IV SOLN: INTRAVENOUS | Status: DC

## 2022-12-11 MED ORDER — ONDANSETRON HCL 4 MG/2ML IJ SOLN
INTRAMUSCULAR | Status: DC | PRN
  Administered 2022-12-11: 4 mg via INTRAVENOUS

## 2022-12-11 MED ORDER — PROPOFOL 10 MG/ML IV BOLUS
INTRAVENOUS | Status: AC
  Filled 2022-12-11: qty 20

## 2022-12-11 MED ORDER — CHLORHEXIDINE GLUCONATE 0.12 % MT SOLN
15.0000 mL | Freq: Once | OROMUCOSAL | Status: AC
  Administered 2022-12-11: 15 mL via OROMUCOSAL

## 2022-12-11 MED ORDER — DEXTROSE 50 % IV SOLN
INTRAVENOUS | Status: AC
  Administered 2022-12-11: 12.5 g via INTRAVENOUS
  Filled 2022-12-11: qty 50

## 2022-12-11 MED ORDER — SUGAMMADEX SODIUM 200 MG/2ML IV SOLN
INTRAVENOUS | Status: DC | PRN
  Administered 2022-12-11: 200 mg via INTRAVENOUS

## 2022-12-11 MED ORDER — DEXTROSE 50 % IV SOLN
12.5000 g | Freq: Once | INTRAVENOUS | Status: AC
  Administered 2022-12-11: 12.5 g via INTRAVENOUS

## 2022-12-11 MED ORDER — AMOXICILLIN 500 MG PO CAPS: 500.0000 mg | ORAL_CAPSULE | Freq: Three times a day (TID) | ORAL | 0 refills | Status: DC

## 2022-12-11 MED ORDER — OXYCODONE HCL 5 MG/5ML PO SOLN: 5.0000 mg | Freq: Once | ORAL | Status: DC | PRN

## 2022-12-11 MED ORDER — LIDOCAINE 2% (20 MG/ML) 5 ML SYRINGE
INTRAMUSCULAR | Status: DC | PRN
  Administered 2022-12-11: 80 mg via INTRAVENOUS

## 2022-12-11 MED ORDER — PHENYLEPHRINE 80 MCG/ML (10ML) SYRINGE FOR IV PUSH (FOR BLOOD PRESSURE SUPPORT)
PREFILLED_SYRINGE | INTRAVENOUS | Status: AC
  Filled 2022-12-11: qty 10

## 2022-12-11 MED ORDER — SODIUM CHLORIDE 0.9 % IR SOLN
Status: DC | PRN
  Administered 2022-12-11: 1000 mL

## 2022-12-11 MED ORDER — CHLORHEXIDINE GLUCONATE 0.12 % MT SOLN
OROMUCOSAL | Status: AC
  Filled 2022-12-11: qty 15

## 2022-12-11 MED ORDER — MIDAZOLAM HCL 2 MG/2ML IJ SOLN
INTRAMUSCULAR | Status: DC | PRN
  Administered 2022-12-11 (×2): 1 mg via INTRAVENOUS

## 2022-12-11 MED ORDER — MIDAZOLAM HCL 2 MG/2ML IJ SOLN
INTRAMUSCULAR | Status: AC
  Filled 2022-12-11: qty 2

## 2022-12-11 MED ORDER — ROCURONIUM BROMIDE 10 MG/ML (PF) SYRINGE
PREFILLED_SYRINGE | INTRAVENOUS | Status: AC
  Filled 2022-12-11: qty 10

## 2022-12-11 MED ORDER — PHENYLEPHRINE 80 MCG/ML (10ML) SYRINGE FOR IV PUSH (FOR BLOOD PRESSURE SUPPORT)
PREFILLED_SYRINGE | INTRAVENOUS | Status: DC | PRN
  Administered 2022-12-11: 320 ug via INTRAVENOUS
  Administered 2022-12-11 (×2): 240 ug via INTRAVENOUS
  Administered 2022-12-11 (×2): 160 ug via INTRAVENOUS

## 2022-12-11 MED ORDER — FENTANYL CITRATE (PF) 250 MCG/5ML IJ SOLN
INTRAMUSCULAR | Status: AC
  Filled 2022-12-11: qty 5

## 2022-12-11 MED ORDER — CEFAZOLIN SODIUM-DEXTROSE 2-4 GM/100ML-% IV SOLN
2.0000 g | INTRAVENOUS | Status: AC
  Administered 2022-12-11: 2 g via INTRAVENOUS
  Filled 2022-12-11: qty 100

## 2022-12-11 MED ORDER — AMISULPRIDE (ANTIEMETIC) 5 MG/2ML IV SOLN: 10.0000 mg | Freq: Once | INTRAVENOUS | Status: DC | PRN

## 2022-12-11 MED ORDER — ORAL CARE MOUTH RINSE: 15.0000 mL | Freq: Once | OROMUCOSAL | Status: AC

## 2022-12-11 MED ORDER — OXYCODONE HCL 5 MG PO TABS: 5.0000 mg | ORAL_TABLET | Freq: Once | ORAL | Status: DC | PRN

## 2022-12-11 MED ORDER — HYDROMORPHONE HCL 1 MG/ML IJ SOLN: 0.2500 mg | INTRAMUSCULAR | Status: DC | PRN

## 2022-12-11 MED ORDER — MEPERIDINE HCL 25 MG/ML IJ SOLN: 6.2500 mg | INTRAMUSCULAR | Status: DC | PRN

## 2022-12-11 MED ORDER — DEXTROSE 50 % IV SOLN: 12.5000 g | INTRAVENOUS | Status: AC

## 2022-12-11 MED ORDER — DEXAMETHASONE SODIUM PHOSPHATE 10 MG/ML IJ SOLN
INTRAMUSCULAR | Status: DC | PRN
  Administered 2022-12-11: 4 mg via INTRAVENOUS

## 2022-12-11 MED ORDER — HYDROCODONE-ACETAMINOPHEN 5-325 MG PO TABS: 1.0000 | ORAL_TABLET | ORAL | 0 refills | Status: AC | PRN

## 2022-12-11 MED ORDER — LIDOCAINE 2% (20 MG/ML) 5 ML SYRINGE
INTRAMUSCULAR | Status: AC
  Filled 2022-12-11: qty 5

## 2022-12-11 MED ORDER — FENTANYL CITRATE (PF) 250 MCG/5ML IJ SOLN
INTRAMUSCULAR | Status: DC | PRN
  Administered 2022-12-11: 50 ug via INTRAVENOUS

## 2022-12-11 MED ORDER — PROPOFOL 10 MG/ML IV BOLUS
INTRAVENOUS | Status: DC | PRN
  Administered 2022-12-11: 140 mg via INTRAVENOUS

## 2022-12-11 MED ORDER — PROMETHAZINE HCL 25 MG/ML IJ SOLN: 6.2500 mg | INTRAMUSCULAR | Status: DC | PRN

## 2022-12-11 MED ORDER — LIDOCAINE-EPINEPHRINE 2 %-1:100000 IJ SOLN
INTRAMUSCULAR | Status: DC | PRN
  Administered 2022-12-11: 20 mL

## 2022-12-11 SURGICAL SUPPLY — 36 items
BAG COUNTER SPONGE SURGICOUNT (BAG) IMPLANT
BAG SPNG CNTER NS LX DISP (BAG) ×1
BLADE SURG 15 STRL LF DISP TIS (BLADE) ×2 IMPLANT
BLADE SURG 15 STRL SS (BLADE) ×1
BUR CROSS CUT FISSURE 1.6 (BURR) ×2 IMPLANT
BUR EGG ELITE 4.0 (BURR) ×2 IMPLANT
CANISTER SUCT 3000ML PPV (MISCELLANEOUS) ×2 IMPLANT
COVER SURGICAL LIGHT HANDLE (MISCELLANEOUS) ×2 IMPLANT
GAUZE PACKING FOLDED 2  STR (GAUZE/BANDAGES/DRESSINGS) ×1
GAUZE PACKING FOLDED 2 STR (GAUZE/BANDAGES/DRESSINGS) ×2 IMPLANT
GLOVE BIO SURGEON STRL SZ 6.5 (GLOVE) IMPLANT
GLOVE BIO SURGEON STRL SZ7 (GLOVE) IMPLANT
GLOVE BIO SURGEON STRL SZ8 (GLOVE) ×2 IMPLANT
GLOVE BIOGEL PI IND STRL 6.5 (GLOVE) IMPLANT
GLOVE BIOGEL PI IND STRL 7.0 (GLOVE) IMPLANT
GOWN STRL REUS W/ TWL LRG LVL3 (GOWN DISPOSABLE) ×2 IMPLANT
GOWN STRL REUS W/ TWL XL LVL3 (GOWN DISPOSABLE) ×2 IMPLANT
GOWN STRL REUS W/TWL LRG LVL3 (GOWN DISPOSABLE) ×1
GOWN STRL REUS W/TWL XL LVL3 (GOWN DISPOSABLE) ×1
IV NS 1000ML (IV SOLUTION) ×1
IV NS 1000ML BAXH (IV SOLUTION) ×2 IMPLANT
KIT BASIN OR (CUSTOM PROCEDURE TRAY) ×2 IMPLANT
KIT TURNOVER KIT B (KITS) ×2 IMPLANT
NDL HYPO 25GX1X1/2 BEV (NEEDLE) ×4 IMPLANT
NEEDLE HYPO 25GX1X1/2 BEV (NEEDLE) ×2 IMPLANT
NS IRRIG 1000ML POUR BTL (IV SOLUTION) ×2 IMPLANT
PAD ARMBOARD 7.5X6 YLW CONV (MISCELLANEOUS) ×2 IMPLANT
SLEEVE IRRIGATION ELITE 7 (MISCELLANEOUS) ×2 IMPLANT
SPIKE FLUID TRANSFER (MISCELLANEOUS) ×2 IMPLANT
SPONGE SURGIFOAM ABS GEL 12-7 (HEMOSTASIS) IMPLANT
SUT CHROMIC 3 0 PS 2 (SUTURE) ×2 IMPLANT
SYR BULB IRRIG 60ML STRL (SYRINGE) ×2 IMPLANT
SYR CONTROL 10ML LL (SYRINGE) ×2 IMPLANT
TRAY ENT MC OR (CUSTOM PROCEDURE TRAY) ×2 IMPLANT
TUBING IRRIGATION (MISCELLANEOUS) ×2 IMPLANT
YANKAUER SUCT BULB TIP NO VENT (SUCTIONS) ×2 IMPLANT

## 2022-12-11 NOTE — Op Note (Signed)
12/11/2022  11:43 AM  PATIENT:  Lucilla Lame  57 y.o. female  PRE-OPERATIVE DIAGNOSIS:  Multiple caries teeth # 2, 6, 7, 8, 9, 10, 11, 13, 14, 20, 23, 24, 25, 26, 27, 28  POST-OPERATIVE DIAGNOSIS:  SAME  PROCEDURE:  Procedure(s): EXTRACTIONS teeth eeth # 2, 6, 7, 8, 9, 10, 11, 13, 14, 20, 23, 24, 25, 26, 27, 28; Alveoloplasty  SURGEON:  Surgeon(s): Ocie Doyne, DMD  ANESTHESIA:   local and general  EBL:  minimal  DRAINS: none   SPECIMEN:  No Specimen  COUNTS:  YES  PLAN OF CARE: Discharge to home after PACU  PATIENT DISPOSITION:  PACU - hemodynamically stable.   PROCEDURE DETAILS: Dictation # [id number]  Georgia Lopes, DMD 12/11/2022 11:43 AM

## 2022-12-11 NOTE — Anesthesia Preprocedure Evaluation (Signed)
Anesthesia Evaluation  Patient identified by MRN, date of birth, ID band Patient awake    Reviewed: Allergy & Precautions, NPO status , Patient's Chart, lab work & pertinent test results  Airway Mallampati: II  TM Distance: >3 FB Neck ROM: Full    Dental  (+) Poor Dentition, Dental Advisory Given   Pulmonary asthma , Patient abstained from smoking., former smoker   Pulmonary exam normal        Cardiovascular hypertension, Pt. on medications and Pt. on home beta blockers  Rhythm:Regular Rate:Normal  Echo: 1. Left ventricular ejection fraction, by estimation, is 60 to 65%. The  left ventricle has normal function. The left ventricle has no regional  wall motion abnormalities. There is mild concentric left ventricular  hypertrophy. Left ventricular diastolic  parameters are consistent with Grade I diastolic dysfunction (impaired  relaxation). Elevated left atrial pressure.  2. Right ventricular systolic function is normal. The right ventricular  size is normal. Tricuspid regurgitation signal is inadequate for assessing  PA pressure.  3. The mitral valve is normal in structure. No evidence of mitral valve  regurgitation. No evidence of mitral stenosis.  4. The aortic valve is normal in structure. Aortic valve regurgitation is  not visualized. No aortic stenosis is present.  5. The inferior vena cava is normal in size with greater than 50%  respiratory variability, suggesting right atrial pressure of 3 mmHg.    Neuro/Psych  PSYCHIATRIC DISORDERS Anxiety Depression Bipolar Disorder  Dementia    GI/Hepatic negative GI ROS, Neg liver ROS,,,  Endo/Other  diabetes, Type 2, Insulin Dependent    Renal/GU Renal disease     Musculoskeletal negative musculoskeletal ROS (+)    Abdominal   Peds  Hematology negative hematology ROS (+)   Anesthesia Other Findings   Reproductive/Obstetrics                              Anesthesia Physical Anesthesia Plan  ASA: 3  Anesthesia Plan: General   Post-op Pain Management: Minimal or no pain anticipated   Induction: Intravenous  PONV Risk Score and Plan: 3 and Ondansetron, Dexamethasone, Treatment may vary due to age or medical condition and Midazolam  Airway Management Planned: Nasal ETT  Additional Equipment: None  Intra-op Plan:   Post-operative Plan: Extubation in OR  Informed Consent: I have reviewed the patients History and Physical, chart, labs and discussed the procedure including the risks, benefits and alternatives for the proposed anesthesia with the patient or authorized representative who has indicated his/her understanding and acceptance.     Dental advisory given and Consent reviewed with POA  Plan Discussed with: CRNA  Anesthesia Plan Comments: (PAT note written 05/28/2022 by Shonna Chock, PA-C. )        Anesthesia Quick Evaluation

## 2022-12-11 NOTE — Transfer of Care (Signed)
Immediate Anesthesia Transfer of Care Note  Patient: Carolyn Norton  Procedure(s) Performed: DENTAL RESTORATION/EXTRACTIONS  Patient Location: PACU  Anesthesia Type:General  Level of Consciousness: awake, alert , and oriented  Airway & Oxygen Therapy: Patient Spontanous Breathing and Patient connected to face mask oxygen  Post-op Assessment: Report given to RN and Post -op Vital signs reviewed and stable  Post vital signs: Reviewed and stable  Last Vitals:  Vitals Value Taken Time  BP 121/66 12/11/22 1202  Temp 36.9 C 12/11/22 1202  Pulse 87 12/11/22 1204  Resp 10 12/11/22 1204  SpO2 100 % 12/11/22 1204  Vitals shown include unvalidated device data.  Last Pain:  Vitals:   12/11/22 0829  TempSrc:   PainSc: 0-No pain      Patients Stated Pain Goal: 0 (12/11/22 0829)  Complications: No notable events documented.

## 2022-12-11 NOTE — H&P (Signed)
H&P documentation  -History and Physical Reviewed  -Patient has been re-examined  -No change in the plan of care  Carolyn Norton  

## 2022-12-11 NOTE — Anesthesia Procedure Notes (Signed)
Procedure Name: Intubation Date/Time: 12/11/2022 11:03 AM  Performed by: Pearson Grippe, CRNAPre-anesthesia Checklist: Patient identified, Emergency Drugs available, Suction available and Patient being monitored Patient Re-evaluated:Patient Re-evaluated prior to induction Oxygen Delivery Method: Circle system utilized Preoxygenation: Pre-oxygenation with 100% oxygen Induction Type: IV induction Ventilation: Mask ventilation without difficulty Laryngoscope Size: Miller and 2 Grade View: Grade I Nasal Tubes: Nasal prep performed, Nasal Rae and Right Tube size: 7.0 mm Placement Confirmation: ETT inserted through vocal cords under direct vision, positive ETCO2 and breath sounds checked- equal and bilateral Tube secured with: Tape Dental Injury: Teeth and Oropharynx as per pre-operative assessment

## 2022-12-11 NOTE — Op Note (Unsigned)
NAMTimoteo Expose: Norton, Carolyn L. MEDICAL RECORD NO: [id number] ACCOUNT NO: [id number] DATE OF BIRTH: 1965-10-26 FACILITY: [facility] LOCATION: [facility] PHYSICIAN: Georgia LopesScott M. Jiyaan Steinhauser, DDS  Operative Report   DATE OF PROCEDURE: 12/11/2022  PREOPERATIVE DIAGNOSIS:  Multiple caries, nonrestorable teeth numbers 2, 6, 7, 8, 9, 10, 11, 13, 14, 20, 23, 24, 25, 26, 27, 28.  POSTOPERATIVE DIAGNOSIS:  Multiple caries, nonrestorable teeth numbers 2, 6, 7, 8, 9, 10, 11, 13, 14, 20, 23, 24, 25, 26, 27, 28.  PROCEDURE:  Extraction teeth numbers 2, 6, 7, 8, 9 10, 11, 13, 14, 20, 23, 24, 25, 26, 27, 28, alveoloplasty.  SURGEON:  Georgia LopesScott M. Sabrin Dunlevy, DDS  ANESTHESIA:  General, nasal intubation.  Dr. Hyacinth MeekerMiller attending.  DESCRIPTION OF PROCEDURE:  The patient was taken to the operating room and placed on the table in supine position.  General anesthesia was administered and nasal endotracheal tube was placed and secured and the eyes were protected.  The patient was  draped for surgery.  Timeout was performed.  The posterior pharynx was suctioned and a throat pack was placed.  2% lidocaine with 1:100,000 epinephrine was infiltrated in an inferior alveolar block on the right and left side buccal infiltration in the  anterior mandible and buccal and palatal infiltration of the maxilla around the teeth to be removed.  A bite block was placed on the right side of the mouth, the left side was operated first.  A #15 blade was used to make an incision beginning 1 cm  proximal to tooth #20 on the alveolar crest carried forward in the buccal sulcus until tooth #27 was encountered.  Another incision was made in the lingual sulcus from tooth #20 to tooth #27.  The periosteum was reflected from around these teeth.  The  301 elevator was used to elevate the teeth and the teeth were removed with the Ash forceps.  Then, the sockets were curetted.  The tissue was trimmed and reflected to expose the alveolar crest, which was irregular in contour.   Alveoloplasty was performed  using the egg bur and the Stryker handpiece under irrigation followed by the bone file.  Then, the area was irrigated and closed with 3-0 chromic.  Then, the left maxilla was operated.  A 15 blade was used to make an incision around teeth numbers 13, 14  and carried forward around the alveolar crest to tooth #11 and then in the buccal and palatal sulcus of teeth numbers 11, 10, 9, 8 and 7.  The periosteum was reflected from around these teeth.  Teeth were elevated and removed with the dental forceps.   The sockets were curetted and then the periosteum was reflected to expose the alveolar crest.  Alveoloplasty was performed using the egg bur followed by the bone file.  Then, the area was irrigated and closed with 3-0 chromic.  Then, the sweetheart  retractor was repositioned to the left side of the mouth and the right side was operated on.  The 15 blade was used to make an incision around teeth numbers 27 and 28 with distal extension of tooth #28 on the alveolar crest approximately 1 cm.  The  periosteum was reflected.  The teeth were elevated and then removed with dental forceps and the sockets were curetted.  Alveoplasty was performed using the egg bur and then the area was irrigated and closed with 3-0 chromic. In the maxilla, the incision  was created around tooth #2 and also, around tooth #6.  The periosteum was reflected.  Tooth #6 was elevated and removed with the dental forceps.  Tooth #2 was sectioned with a Stryker handpiece and fissure bur under irrigation and the 3 roots were  removed individually using 301 elevator and rongeurs and then the area was irrigated and no suture was needed.  The oral cavity was then irrigated and suctioned.  Additional local anesthetic was given.  The patient was left under care of anesthesia after  removal of the throat pack for extubation and transport to recovery with plans for discharge home through day surgery.  ESTIMATED BLOOD LOSS:   Minimal.  COMPLICATIONS:  None.  SPECIMENS:  None.  COUNTS:  Correct.   PUS D: 12/11/2022 11:47:52 am T: 12/11/2022 1:14:00 pm  JOB: [id number]/ [id number]

## 2022-12-11 NOTE — H&P (Signed)
Anesthesia H&P Update: History and Physical Exam reviewed; patient is OK for planned anesthetic and procedure. ? ?

## 2022-12-12 NOTE — Anesthesia Postprocedure Evaluation (Signed)
Anesthesia Post Note  Patient: Joslynne Balser  Procedure(s) Performed: DENTAL RESTORATION/EXTRACTIONS     Patient location during evaluation: PACU Anesthesia Type: General Level of consciousness: awake and alert Pain management: pain level controlled Vital Signs Assessment: post-procedure vital signs reviewed and stable Respiratory status: spontaneous breathing, nonlabored ventilation, respiratory function stable and patient connected to nasal cannula oxygen Cardiovascular status: blood pressure returned to baseline and stable Postop Assessment: no apparent nausea or vomiting Anesthetic complications: no   No notable events documented.  Last Vitals:  Vitals:   12/11/22 1230 12/11/22 1245  BP: 124/73 128/74  Pulse: 85 85  Resp: 11 13  Temp:  36.5 C  SpO2: 93% 94%    Last Pain:  Vitals:   12/11/22 1245  TempSrc:   PainSc: 0-No pain                 Collene Schlichter

## 2022-12-13 ENCOUNTER — Encounter (HOSPITAL_COMMUNITY): Payer: Self-pay | Admitting: Oral Surgery

## 2022-12-15 DIAGNOSIS — Z79899 Other long term (current) drug therapy: Secondary | ICD-10-CM | POA: Diagnosis not present

## 2022-12-15 DIAGNOSIS — R52 Pain, unspecified: Secondary | ICD-10-CM | POA: Diagnosis not present

## 2022-12-16 DIAGNOSIS — R4189 Other symptoms and signs involving cognitive functions and awareness: Secondary | ICD-10-CM | POA: Diagnosis not present

## 2022-12-16 DIAGNOSIS — U071 COVID-19: Secondary | ICD-10-CM | POA: Diagnosis not present

## 2022-12-16 DIAGNOSIS — E11649 Type 2 diabetes mellitus with hypoglycemia without coma: Secondary | ICD-10-CM | POA: Diagnosis not present

## 2022-12-16 DIAGNOSIS — R52 Pain, unspecified: Secondary | ICD-10-CM | POA: Diagnosis not present

## 2022-12-16 DIAGNOSIS — N390 Urinary tract infection, site not specified: Secondary | ICD-10-CM | POA: Diagnosis not present

## 2022-12-16 DIAGNOSIS — I739 Peripheral vascular disease, unspecified: Secondary | ICD-10-CM | POA: Diagnosis not present

## 2022-12-16 DIAGNOSIS — F02B2 Dementia in other diseases classified elsewhere, moderate, with psychotic disturbance: Secondary | ICD-10-CM | POA: Diagnosis not present

## 2022-12-16 DIAGNOSIS — B351 Tinea unguium: Secondary | ICD-10-CM | POA: Diagnosis not present

## 2022-12-16 DIAGNOSIS — R4182 Altered mental status, unspecified: Secondary | ICD-10-CM | POA: Diagnosis not present

## 2022-12-16 DIAGNOSIS — F32A Depression, unspecified: Secondary | ICD-10-CM | POA: Diagnosis not present

## 2022-12-16 DIAGNOSIS — F02C0 Dementia in other diseases classified elsewhere, severe, without behavioral disturbance, psychotic disturbance, mood disturbance, and anxiety: Secondary | ICD-10-CM | POA: Diagnosis not present

## 2022-12-16 DIAGNOSIS — Z48812 Encounter for surgical aftercare following surgery on the circulatory system: Secondary | ICD-10-CM | POA: Diagnosis not present

## 2022-12-16 DIAGNOSIS — Z982 Presence of cerebrospinal fluid drainage device: Secondary | ICD-10-CM | POA: Diagnosis not present

## 2022-12-16 DIAGNOSIS — E1110 Type 2 diabetes mellitus with ketoacidosis without coma: Secondary | ICD-10-CM | POA: Diagnosis not present

## 2022-12-16 DIAGNOSIS — B3731 Acute candidiasis of vulva and vagina: Secondary | ICD-10-CM | POA: Diagnosis not present

## 2022-12-16 DIAGNOSIS — E1165 Type 2 diabetes mellitus with hyperglycemia: Secondary | ICD-10-CM | POA: Diagnosis not present

## 2022-12-16 DIAGNOSIS — F419 Anxiety disorder, unspecified: Secondary | ICD-10-CM | POA: Diagnosis not present

## 2022-12-16 DIAGNOSIS — N179 Acute kidney failure, unspecified: Secondary | ICD-10-CM | POA: Diagnosis not present

## 2022-12-16 DIAGNOSIS — E875 Hyperkalemia: Secondary | ICD-10-CM | POA: Diagnosis not present

## 2022-12-16 DIAGNOSIS — F319 Bipolar disorder, unspecified: Secondary | ICD-10-CM | POA: Diagnosis not present

## 2022-12-16 DIAGNOSIS — Z8616 Personal history of COVID-19: Secondary | ICD-10-CM | POA: Diagnosis not present

## 2022-12-16 DIAGNOSIS — F3173 Bipolar disorder, in partial remission, most recent episode manic: Secondary | ICD-10-CM | POA: Diagnosis not present

## 2022-12-16 DIAGNOSIS — E1169 Type 2 diabetes mellitus with other specified complication: Secondary | ICD-10-CM | POA: Diagnosis not present

## 2022-12-16 DIAGNOSIS — F02B3 Dementia in other diseases classified elsewhere, moderate, with mood disturbance: Secondary | ICD-10-CM | POA: Diagnosis not present

## 2022-12-22 DIAGNOSIS — R52 Pain, unspecified: Secondary | ICD-10-CM | POA: Diagnosis not present

## 2022-12-22 DIAGNOSIS — Z79899 Other long term (current) drug therapy: Secondary | ICD-10-CM | POA: Diagnosis not present

## 2022-12-22 DIAGNOSIS — B029 Zoster without complications: Secondary | ICD-10-CM | POA: Diagnosis not present

## 2022-12-23 DIAGNOSIS — F319 Bipolar disorder, unspecified: Secondary | ICD-10-CM | POA: Diagnosis not present

## 2022-12-23 DIAGNOSIS — M6281 Muscle weakness (generalized): Secondary | ICD-10-CM | POA: Diagnosis not present

## 2022-12-23 DIAGNOSIS — Z982 Presence of cerebrospinal fluid drainage device: Secondary | ICD-10-CM | POA: Diagnosis not present

## 2022-12-23 DIAGNOSIS — M25531 Pain in right wrist: Secondary | ICD-10-CM | POA: Diagnosis not present

## 2022-12-23 DIAGNOSIS — R4182 Altered mental status, unspecified: Secondary | ICD-10-CM | POA: Diagnosis not present

## 2022-12-23 DIAGNOSIS — E11649 Type 2 diabetes mellitus with hypoglycemia without coma: Secondary | ICD-10-CM | POA: Diagnosis not present

## 2022-12-23 DIAGNOSIS — E1110 Type 2 diabetes mellitus with ketoacidosis without coma: Secondary | ICD-10-CM | POA: Diagnosis not present

## 2022-12-23 DIAGNOSIS — R400 Somnolence: Secondary | ICD-10-CM | POA: Diagnosis not present

## 2022-12-23 DIAGNOSIS — F02C0 Dementia in other diseases classified elsewhere, severe, without behavioral disturbance, psychotic disturbance, mood disturbance, and anxiety: Secondary | ICD-10-CM | POA: Diagnosis not present

## 2022-12-23 DIAGNOSIS — R0981 Nasal congestion: Secondary | ICD-10-CM | POA: Diagnosis not present

## 2022-12-23 DIAGNOSIS — G6281 Critical illness polyneuropathy: Secondary | ICD-10-CM | POA: Diagnosis not present

## 2022-12-23 DIAGNOSIS — J45909 Unspecified asthma, uncomplicated: Secondary | ICD-10-CM | POA: Diagnosis not present

## 2022-12-23 DIAGNOSIS — F29 Unspecified psychosis not due to a substance or known physiological condition: Secondary | ICD-10-CM | POA: Diagnosis not present

## 2022-12-23 DIAGNOSIS — N181 Chronic kidney disease, stage 1: Secondary | ICD-10-CM | POA: Diagnosis not present

## 2022-12-23 DIAGNOSIS — R062 Wheezing: Secondary | ICD-10-CM | POA: Diagnosis not present

## 2022-12-23 DIAGNOSIS — Z48812 Encounter for surgical aftercare following surgery on the circulatory system: Secondary | ICD-10-CM | POA: Diagnosis not present

## 2022-12-23 DIAGNOSIS — E1165 Type 2 diabetes mellitus with hyperglycemia: Secondary | ICD-10-CM | POA: Diagnosis not present

## 2022-12-23 DIAGNOSIS — N179 Acute kidney failure, unspecified: Secondary | ICD-10-CM | POA: Diagnosis not present

## 2022-12-23 DIAGNOSIS — F3132 Bipolar disorder, current episode depressed, moderate: Secondary | ICD-10-CM | POA: Diagnosis not present

## 2022-12-23 DIAGNOSIS — H8110 Benign paroxysmal vertigo, unspecified ear: Secondary | ICD-10-CM | POA: Diagnosis not present

## 2022-12-23 DIAGNOSIS — I10 Essential (primary) hypertension: Secondary | ICD-10-CM | POA: Diagnosis not present

## 2022-12-23 DIAGNOSIS — G6289 Other specified polyneuropathies: Secondary | ICD-10-CM | POA: Diagnosis not present

## 2022-12-23 DIAGNOSIS — F02B3 Dementia in other diseases classified elsewhere, moderate, with mood disturbance: Secondary | ICD-10-CM | POA: Diagnosis not present

## 2022-12-23 DIAGNOSIS — G300 Alzheimer's disease with early onset: Secondary | ICD-10-CM | POA: Diagnosis not present

## 2022-12-23 DIAGNOSIS — E1169 Type 2 diabetes mellitus with other specified complication: Secondary | ICD-10-CM | POA: Diagnosis not present

## 2022-12-24 DIAGNOSIS — Z79899 Other long term (current) drug therapy: Secondary | ICD-10-CM | POA: Diagnosis not present

## 2022-12-24 DIAGNOSIS — F419 Anxiety disorder, unspecified: Secondary | ICD-10-CM | POA: Diagnosis not present

## 2022-12-28 DIAGNOSIS — I10 Essential (primary) hypertension: Secondary | ICD-10-CM | POA: Diagnosis not present

## 2022-12-28 DIAGNOSIS — J449 Chronic obstructive pulmonary disease, unspecified: Secondary | ICD-10-CM | POA: Diagnosis not present

## 2022-12-28 DIAGNOSIS — E1165 Type 2 diabetes mellitus with hyperglycemia: Secondary | ICD-10-CM | POA: Diagnosis not present

## 2022-12-28 DIAGNOSIS — N182 Chronic kidney disease, stage 2 (mild): Secondary | ICD-10-CM | POA: Diagnosis not present

## 2022-12-29 DIAGNOSIS — N179 Acute kidney failure, unspecified: Secondary | ICD-10-CM | POA: Diagnosis not present

## 2022-12-29 DIAGNOSIS — B029 Zoster without complications: Secondary | ICD-10-CM | POA: Diagnosis not present

## 2022-12-29 DIAGNOSIS — E785 Hyperlipidemia, unspecified: Secondary | ICD-10-CM | POA: Diagnosis not present

## 2022-12-29 DIAGNOSIS — E1165 Type 2 diabetes mellitus with hyperglycemia: Secondary | ICD-10-CM | POA: Diagnosis not present

## 2022-12-29 DIAGNOSIS — B0229 Other postherpetic nervous system involvement: Secondary | ICD-10-CM | POA: Diagnosis not present

## 2022-12-29 DIAGNOSIS — I10 Essential (primary) hypertension: Secondary | ICD-10-CM | POA: Diagnosis not present

## 2022-12-29 DIAGNOSIS — Z5181 Encounter for therapeutic drug level monitoring: Secondary | ICD-10-CM | POA: Diagnosis not present

## 2022-12-29 DIAGNOSIS — Z79899 Other long term (current) drug therapy: Secondary | ICD-10-CM | POA: Diagnosis not present

## 2022-12-30 DIAGNOSIS — R400 Somnolence: Secondary | ICD-10-CM | POA: Diagnosis not present

## 2022-12-30 DIAGNOSIS — E1169 Type 2 diabetes mellitus with other specified complication: Secondary | ICD-10-CM | POA: Diagnosis not present

## 2022-12-30 DIAGNOSIS — F3132 Bipolar disorder, current episode depressed, moderate: Secondary | ICD-10-CM | POA: Diagnosis not present

## 2022-12-30 DIAGNOSIS — F319 Bipolar disorder, unspecified: Secondary | ICD-10-CM | POA: Diagnosis not present

## 2022-12-30 DIAGNOSIS — R4182 Altered mental status, unspecified: Secondary | ICD-10-CM | POA: Diagnosis not present

## 2022-12-30 DIAGNOSIS — N179 Acute kidney failure, unspecified: Secondary | ICD-10-CM | POA: Diagnosis not present

## 2022-12-30 DIAGNOSIS — R0981 Nasal congestion: Secondary | ICD-10-CM | POA: Diagnosis not present

## 2022-12-30 DIAGNOSIS — G300 Alzheimer's disease with early onset: Secondary | ICD-10-CM | POA: Diagnosis not present

## 2022-12-30 DIAGNOSIS — F29 Unspecified psychosis not due to a substance or known physiological condition: Secondary | ICD-10-CM | POA: Diagnosis not present

## 2022-12-30 DIAGNOSIS — E1110 Type 2 diabetes mellitus with ketoacidosis without coma: Secondary | ICD-10-CM | POA: Diagnosis not present

## 2022-12-30 DIAGNOSIS — M25531 Pain in right wrist: Secondary | ICD-10-CM | POA: Diagnosis not present

## 2022-12-30 DIAGNOSIS — Z48812 Encounter for surgical aftercare following surgery on the circulatory system: Secondary | ICD-10-CM | POA: Diagnosis not present

## 2022-12-30 DIAGNOSIS — G6289 Other specified polyneuropathies: Secondary | ICD-10-CM | POA: Diagnosis not present

## 2022-12-30 DIAGNOSIS — N181 Chronic kidney disease, stage 1: Secondary | ICD-10-CM | POA: Diagnosis not present

## 2022-12-30 DIAGNOSIS — J45909 Unspecified asthma, uncomplicated: Secondary | ICD-10-CM | POA: Diagnosis not present

## 2022-12-30 DIAGNOSIS — E1165 Type 2 diabetes mellitus with hyperglycemia: Secondary | ICD-10-CM | POA: Diagnosis not present

## 2022-12-30 DIAGNOSIS — Z982 Presence of cerebrospinal fluid drainage device: Secondary | ICD-10-CM | POA: Diagnosis not present

## 2022-12-30 DIAGNOSIS — H8110 Benign paroxysmal vertigo, unspecified ear: Secondary | ICD-10-CM | POA: Diagnosis not present

## 2022-12-30 DIAGNOSIS — F02C0 Dementia in other diseases classified elsewhere, severe, without behavioral disturbance, psychotic disturbance, mood disturbance, and anxiety: Secondary | ICD-10-CM | POA: Diagnosis not present

## 2022-12-30 DIAGNOSIS — F02B3 Dementia in other diseases classified elsewhere, moderate, with mood disturbance: Secondary | ICD-10-CM | POA: Diagnosis not present

## 2022-12-30 DIAGNOSIS — M6281 Muscle weakness (generalized): Secondary | ICD-10-CM | POA: Diagnosis not present

## 2022-12-30 DIAGNOSIS — E11649 Type 2 diabetes mellitus with hypoglycemia without coma: Secondary | ICD-10-CM | POA: Diagnosis not present

## 2022-12-30 DIAGNOSIS — R062 Wheezing: Secondary | ICD-10-CM | POA: Diagnosis not present

## 2022-12-30 DIAGNOSIS — I10 Essential (primary) hypertension: Secondary | ICD-10-CM | POA: Diagnosis not present

## 2022-12-30 DIAGNOSIS — G6281 Critical illness polyneuropathy: Secondary | ICD-10-CM | POA: Diagnosis not present

## 2022-12-31 DIAGNOSIS — Z79899 Other long term (current) drug therapy: Secondary | ICD-10-CM | POA: Diagnosis not present

## 2022-12-31 DIAGNOSIS — K219 Gastro-esophageal reflux disease without esophagitis: Secondary | ICD-10-CM | POA: Diagnosis not present

## 2023-01-06 DIAGNOSIS — R400 Somnolence: Secondary | ICD-10-CM | POA: Diagnosis not present

## 2023-01-06 DIAGNOSIS — G6289 Other specified polyneuropathies: Secondary | ICD-10-CM | POA: Diagnosis not present

## 2023-01-06 DIAGNOSIS — F29 Unspecified psychosis not due to a substance or known physiological condition: Secondary | ICD-10-CM | POA: Diagnosis not present

## 2023-01-06 DIAGNOSIS — N179 Acute kidney failure, unspecified: Secondary | ICD-10-CM | POA: Diagnosis not present

## 2023-01-06 DIAGNOSIS — J45909 Unspecified asthma, uncomplicated: Secondary | ICD-10-CM | POA: Diagnosis not present

## 2023-01-06 DIAGNOSIS — F319 Bipolar disorder, unspecified: Secondary | ICD-10-CM | POA: Diagnosis not present

## 2023-01-06 DIAGNOSIS — Z982 Presence of cerebrospinal fluid drainage device: Secondary | ICD-10-CM | POA: Diagnosis not present

## 2023-01-06 DIAGNOSIS — M25531 Pain in right wrist: Secondary | ICD-10-CM | POA: Diagnosis not present

## 2023-01-06 DIAGNOSIS — Z48812 Encounter for surgical aftercare following surgery on the circulatory system: Secondary | ICD-10-CM | POA: Diagnosis not present

## 2023-01-06 DIAGNOSIS — R0981 Nasal congestion: Secondary | ICD-10-CM | POA: Diagnosis not present

## 2023-01-06 DIAGNOSIS — E1165 Type 2 diabetes mellitus with hyperglycemia: Secondary | ICD-10-CM | POA: Diagnosis not present

## 2023-01-06 DIAGNOSIS — F02B3 Dementia in other diseases classified elsewhere, moderate, with mood disturbance: Secondary | ICD-10-CM | POA: Diagnosis not present

## 2023-01-06 DIAGNOSIS — F3132 Bipolar disorder, current episode depressed, moderate: Secondary | ICD-10-CM | POA: Diagnosis not present

## 2023-01-06 DIAGNOSIS — F02C0 Dementia in other diseases classified elsewhere, severe, without behavioral disturbance, psychotic disturbance, mood disturbance, and anxiety: Secondary | ICD-10-CM | POA: Diagnosis not present

## 2023-01-06 DIAGNOSIS — G300 Alzheimer's disease with early onset: Secondary | ICD-10-CM | POA: Diagnosis not present

## 2023-01-06 DIAGNOSIS — R062 Wheezing: Secondary | ICD-10-CM | POA: Diagnosis not present

## 2023-01-06 DIAGNOSIS — E1169 Type 2 diabetes mellitus with other specified complication: Secondary | ICD-10-CM | POA: Diagnosis not present

## 2023-01-06 DIAGNOSIS — N181 Chronic kidney disease, stage 1: Secondary | ICD-10-CM | POA: Diagnosis not present

## 2023-01-06 DIAGNOSIS — G6281 Critical illness polyneuropathy: Secondary | ICD-10-CM | POA: Diagnosis not present

## 2023-01-06 DIAGNOSIS — M6281 Muscle weakness (generalized): Secondary | ICD-10-CM | POA: Diagnosis not present

## 2023-01-06 DIAGNOSIS — E11649 Type 2 diabetes mellitus with hypoglycemia without coma: Secondary | ICD-10-CM | POA: Diagnosis not present

## 2023-01-06 DIAGNOSIS — H8110 Benign paroxysmal vertigo, unspecified ear: Secondary | ICD-10-CM | POA: Diagnosis not present

## 2023-01-06 DIAGNOSIS — I10 Essential (primary) hypertension: Secondary | ICD-10-CM | POA: Diagnosis not present

## 2023-01-06 DIAGNOSIS — R4182 Altered mental status, unspecified: Secondary | ICD-10-CM | POA: Diagnosis not present

## 2023-01-06 DIAGNOSIS — E1110 Type 2 diabetes mellitus with ketoacidosis without coma: Secondary | ICD-10-CM | POA: Diagnosis not present

## 2023-01-06 IMAGING — RF DG SPINAL PUNCT LUMBAR DIAG WITH FL CT GUIDANCE
2 series · 2 of 2 positions shown · non-contrast
Comparison: Brain MRI and head CT from Tuesday January, 2021 with CT of the
chest abdomen pelvis from 0362.

CLINICAL DATA: Suspected normal pressure hydrocephalus.

EXAM:
DIAGNOSTIC LUMBAR PUNCTURE UNDER FLUOROSCOPIC GUIDANCE

[Series 1: cp_standard · 0.17mm/px · 1 of 1 slices shown (1 of 2)]
[im 1/1]
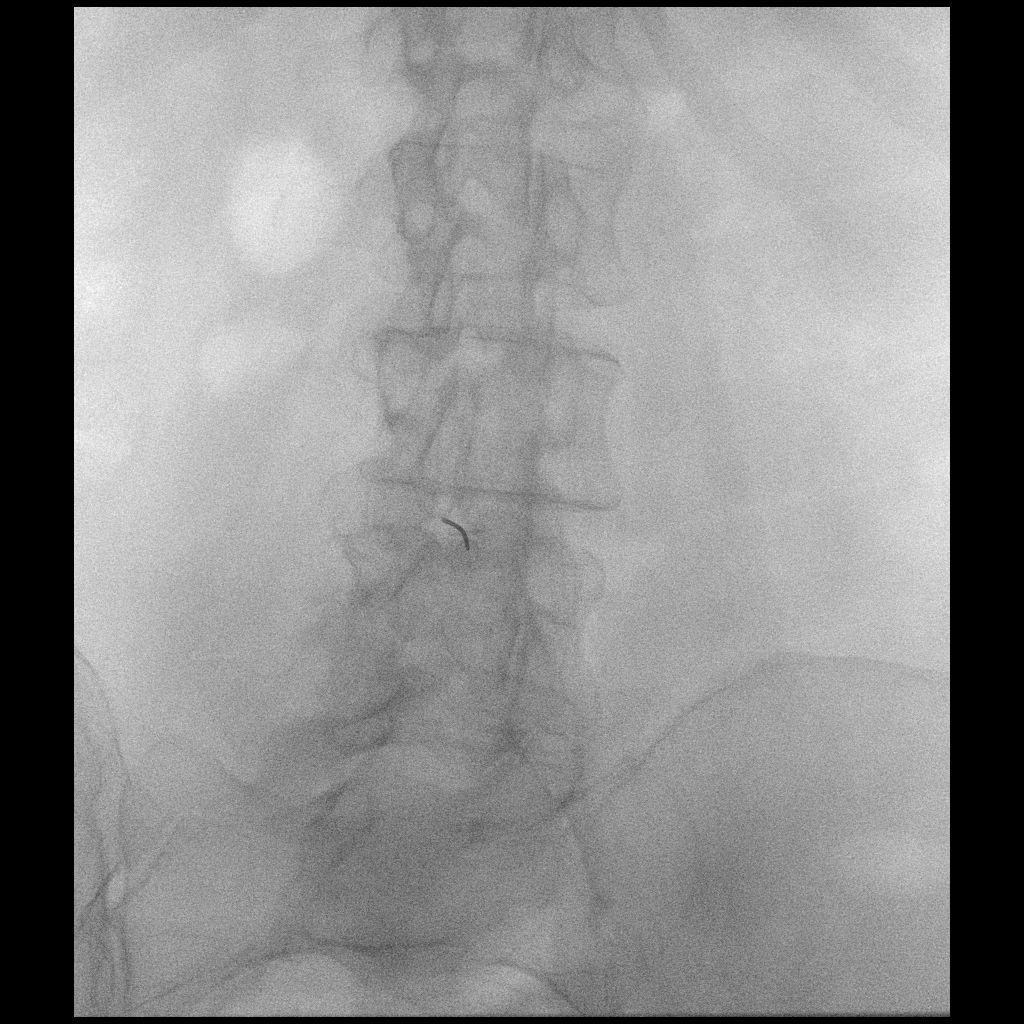

[Series 2: cp_standard · 0.17mm/px · 1 of 1 slices shown (2 of 2)]
[im 1/1]
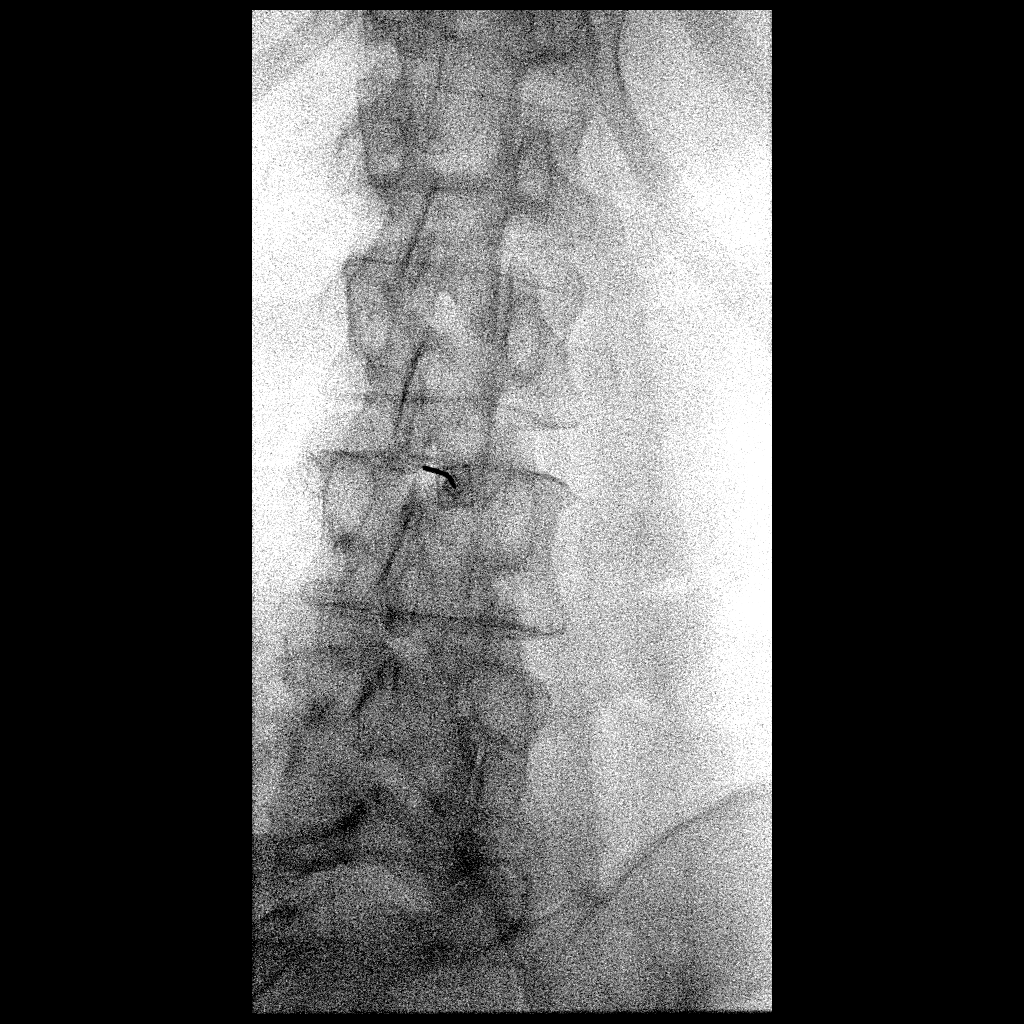

[2 of 2 positions shown; findings below may reference images not displayed]

FLUOROSCOPY TIME:  Fluoroscopy Time:  1 minutes 6 seconds

Radiation Exposure Index (if provided by the fluoroscopic device):
12.5

Number of Acquired Spot Images: 0

PROCEDURE:
Informed consent was obtained from the patient prior to the
procedure, including potential complications of headache, allergy,
and pain. With the patient prone, the lower back was prepped with
Betadine. 1% Lidocaine was used for local anesthesia.

Initial attempt at lumbar puncture was performed at the L3-4 level.
CSF mildly blood tinged began to flow, initially clearing. Small
amount of blood gathered in the above of the needle upon turning the
needle to increased flow. Head of the bed was elevated. Clot was
cleared from the hub. Clear CSF flow around this area and showed
pulsation as expected in variation with respiration and talking.
Opening pressure approximately 12.5 cm of water. Approximately 5 cc
was removed initially with some loss of fluid in manipulating tubing
and attempting to position the patient for best return.

Second attempt was performed at the L2-3 level with similar results,
4 cc removed.
IMPRESSION: Technically successful lumbar puncture with low opening pressures
that did not yield significant quantity of CSF. CSF was blood tinged
but did appear to clear around small amount of blood in the hub but
would not flow in a significant quantity despite indications that
the needle was not obstructed. Total of 9 cc was sent to the
laboratory for requested testing.

## 2023-01-07 DIAGNOSIS — K219 Gastro-esophageal reflux disease without esophagitis: Secondary | ICD-10-CM | POA: Diagnosis not present

## 2023-01-07 DIAGNOSIS — K30 Functional dyspepsia: Secondary | ICD-10-CM | POA: Diagnosis not present

## 2023-01-13 DIAGNOSIS — R400 Somnolence: Secondary | ICD-10-CM | POA: Diagnosis not present

## 2023-01-13 DIAGNOSIS — R062 Wheezing: Secondary | ICD-10-CM | POA: Diagnosis not present

## 2023-01-13 DIAGNOSIS — F29 Unspecified psychosis not due to a substance or known physiological condition: Secondary | ICD-10-CM | POA: Diagnosis not present

## 2023-01-13 DIAGNOSIS — E1169 Type 2 diabetes mellitus with other specified complication: Secondary | ICD-10-CM | POA: Diagnosis not present

## 2023-01-13 DIAGNOSIS — J45909 Unspecified asthma, uncomplicated: Secondary | ICD-10-CM | POA: Diagnosis not present

## 2023-01-13 DIAGNOSIS — I10 Essential (primary) hypertension: Secondary | ICD-10-CM | POA: Diagnosis not present

## 2023-01-13 DIAGNOSIS — F3132 Bipolar disorder, current episode depressed, moderate: Secondary | ICD-10-CM | POA: Diagnosis not present

## 2023-01-13 DIAGNOSIS — E1165 Type 2 diabetes mellitus with hyperglycemia: Secondary | ICD-10-CM | POA: Diagnosis not present

## 2023-01-13 DIAGNOSIS — Z982 Presence of cerebrospinal fluid drainage device: Secondary | ICD-10-CM | POA: Diagnosis not present

## 2023-01-13 DIAGNOSIS — M25531 Pain in right wrist: Secondary | ICD-10-CM | POA: Diagnosis not present

## 2023-01-13 DIAGNOSIS — R0981 Nasal congestion: Secondary | ICD-10-CM | POA: Diagnosis not present

## 2023-01-13 DIAGNOSIS — E1110 Type 2 diabetes mellitus with ketoacidosis without coma: Secondary | ICD-10-CM | POA: Diagnosis not present

## 2023-01-13 DIAGNOSIS — R4182 Altered mental status, unspecified: Secondary | ICD-10-CM | POA: Diagnosis not present

## 2023-01-13 DIAGNOSIS — G300 Alzheimer's disease with early onset: Secondary | ICD-10-CM | POA: Diagnosis not present

## 2023-01-13 DIAGNOSIS — F02B3 Dementia in other diseases classified elsewhere, moderate, with mood disturbance: Secondary | ICD-10-CM | POA: Diagnosis not present

## 2023-01-13 DIAGNOSIS — N181 Chronic kidney disease, stage 1: Secondary | ICD-10-CM | POA: Diagnosis not present

## 2023-01-13 DIAGNOSIS — G6281 Critical illness polyneuropathy: Secondary | ICD-10-CM | POA: Diagnosis not present

## 2023-01-13 DIAGNOSIS — F319 Bipolar disorder, unspecified: Secondary | ICD-10-CM | POA: Diagnosis not present

## 2023-01-13 DIAGNOSIS — Z48812 Encounter for surgical aftercare following surgery on the circulatory system: Secondary | ICD-10-CM | POA: Diagnosis not present

## 2023-01-13 DIAGNOSIS — E11649 Type 2 diabetes mellitus with hypoglycemia without coma: Secondary | ICD-10-CM | POA: Diagnosis not present

## 2023-01-13 DIAGNOSIS — N179 Acute kidney failure, unspecified: Secondary | ICD-10-CM | POA: Diagnosis not present

## 2023-01-13 DIAGNOSIS — H8110 Benign paroxysmal vertigo, unspecified ear: Secondary | ICD-10-CM | POA: Diagnosis not present

## 2023-01-13 DIAGNOSIS — G6289 Other specified polyneuropathies: Secondary | ICD-10-CM | POA: Diagnosis not present

## 2023-01-13 DIAGNOSIS — F02C0 Dementia in other diseases classified elsewhere, severe, without behavioral disturbance, psychotic disturbance, mood disturbance, and anxiety: Secondary | ICD-10-CM | POA: Diagnosis not present

## 2023-01-13 DIAGNOSIS — M6281 Muscle weakness (generalized): Secondary | ICD-10-CM | POA: Diagnosis not present

## 2023-01-14 DIAGNOSIS — H25813 Combined forms of age-related cataract, bilateral: Secondary | ICD-10-CM | POA: Diagnosis not present

## 2023-01-14 DIAGNOSIS — H01001 Unspecified blepharitis right upper eyelid: Secondary | ICD-10-CM | POA: Diagnosis not present

## 2023-01-14 DIAGNOSIS — H02831 Dermatochalasis of right upper eyelid: Secondary | ICD-10-CM | POA: Diagnosis not present

## 2023-01-14 DIAGNOSIS — H35372 Puckering of macula, left eye: Secondary | ICD-10-CM | POA: Diagnosis not present

## 2023-01-14 DIAGNOSIS — H02834 Dermatochalasis of left upper eyelid: Secondary | ICD-10-CM | POA: Diagnosis not present

## 2023-01-14 IMAGING — DX DG CHEST 2V
2 series · 2 of 2 positions shown · non-contrast
Comparison: January 11, 2021

CLINICAL DATA: Shortness of breath

EXAM:
CHEST - 2 VIEW

[x chest ap]
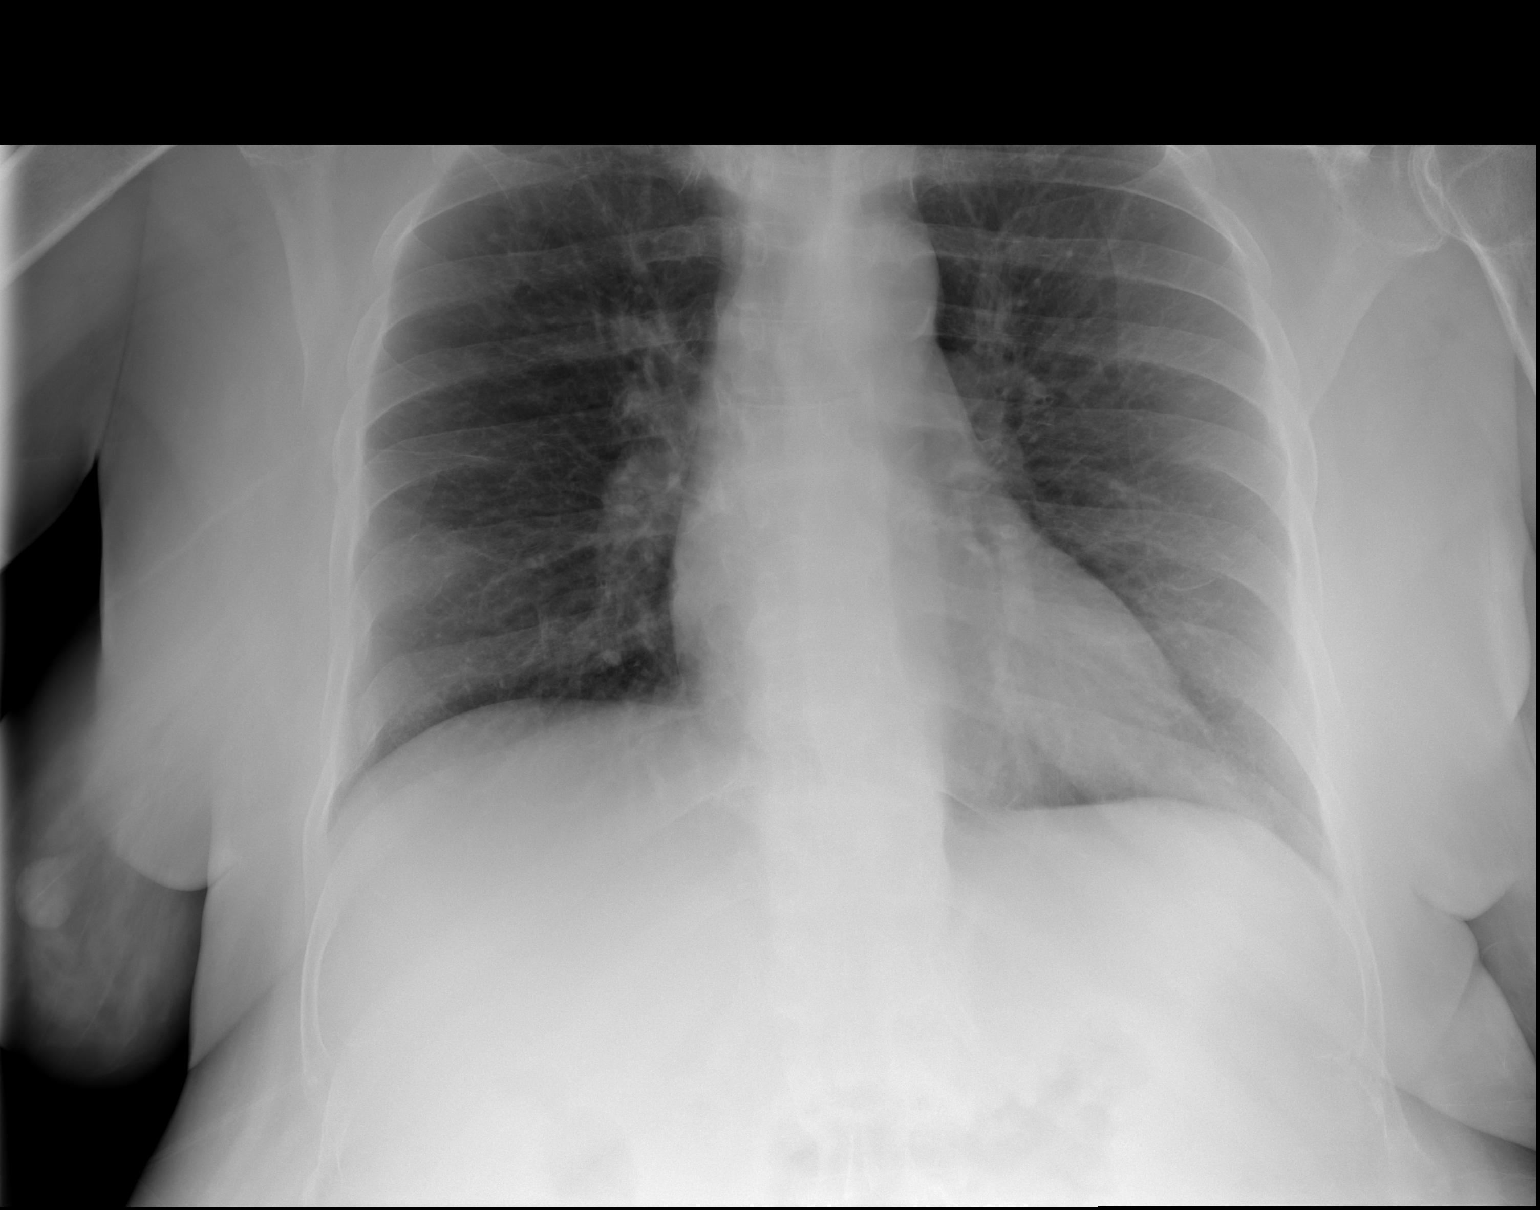

[w chest lat]
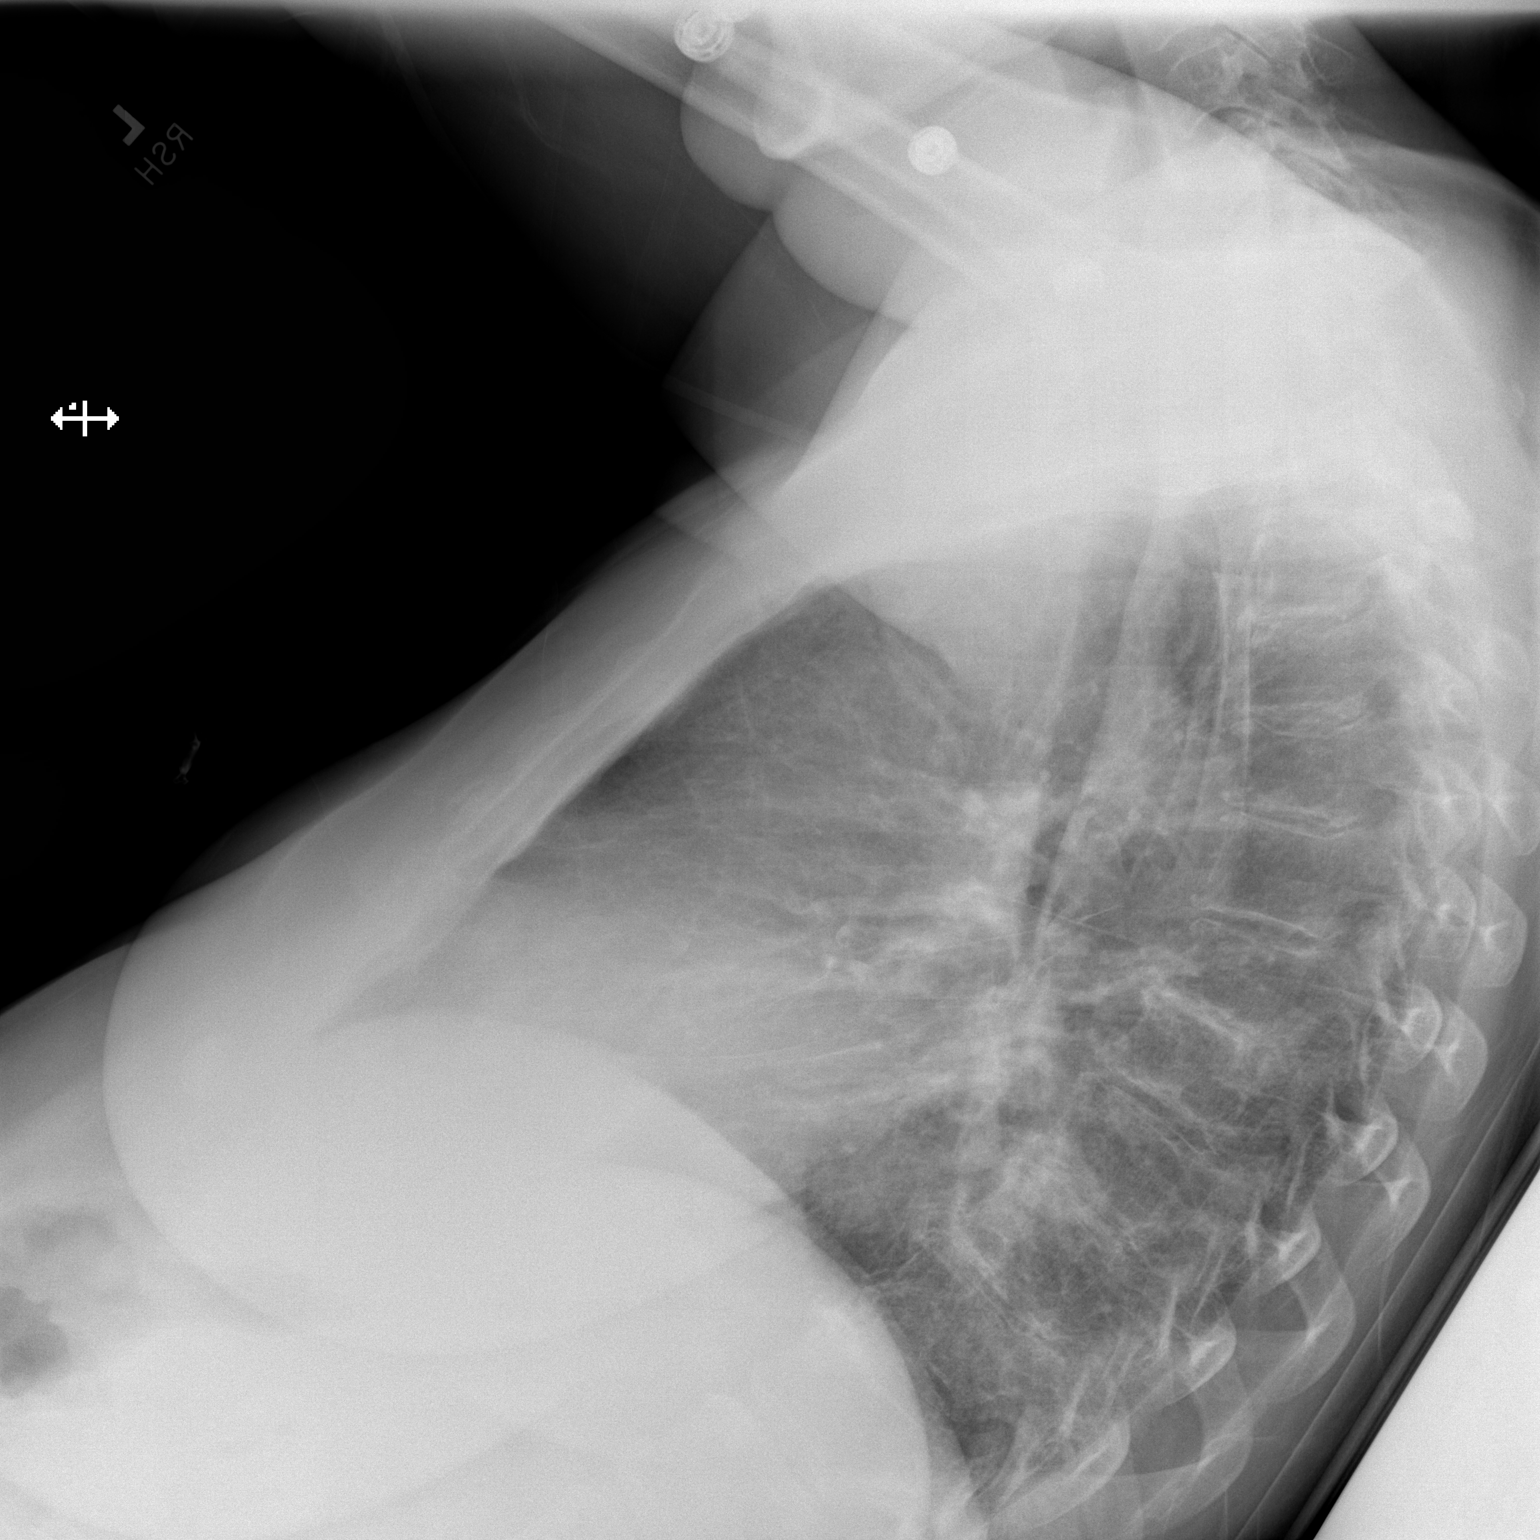

[2 of 2 positions shown; findings below may reference images not displayed]

FINDINGS: The lungs are clear. The heart size and pulmonary vascularity are
normal. No adenopathy. There is degenerative change in the thoracic
spine.
IMPRESSION: Lungs clear.  Cardiac silhouette normal.

## 2023-01-15 DIAGNOSIS — B0229 Other postherpetic nervous system involvement: Secondary | ICD-10-CM | POA: Diagnosis not present

## 2023-01-15 DIAGNOSIS — R52 Pain, unspecified: Secondary | ICD-10-CM | POA: Diagnosis not present

## 2023-01-20 DIAGNOSIS — F3132 Bipolar disorder, current episode depressed, moderate: Secondary | ICD-10-CM | POA: Diagnosis not present

## 2023-01-20 DIAGNOSIS — R0981 Nasal congestion: Secondary | ICD-10-CM | POA: Diagnosis not present

## 2023-01-20 DIAGNOSIS — E1165 Type 2 diabetes mellitus with hyperglycemia: Secondary | ICD-10-CM | POA: Diagnosis not present

## 2023-01-20 DIAGNOSIS — E1169 Type 2 diabetes mellitus with other specified complication: Secondary | ICD-10-CM | POA: Diagnosis not present

## 2023-01-20 DIAGNOSIS — F29 Unspecified psychosis not due to a substance or known physiological condition: Secondary | ICD-10-CM | POA: Diagnosis not present

## 2023-01-20 DIAGNOSIS — G6289 Other specified polyneuropathies: Secondary | ICD-10-CM | POA: Diagnosis not present

## 2023-01-20 DIAGNOSIS — R4182 Altered mental status, unspecified: Secondary | ICD-10-CM | POA: Diagnosis not present

## 2023-01-20 DIAGNOSIS — Z982 Presence of cerebrospinal fluid drainage device: Secondary | ICD-10-CM | POA: Diagnosis not present

## 2023-01-20 DIAGNOSIS — R062 Wheezing: Secondary | ICD-10-CM | POA: Diagnosis not present

## 2023-01-20 DIAGNOSIS — J45909 Unspecified asthma, uncomplicated: Secondary | ICD-10-CM | POA: Diagnosis not present

## 2023-01-20 DIAGNOSIS — F02B3 Dementia in other diseases classified elsewhere, moderate, with mood disturbance: Secondary | ICD-10-CM | POA: Diagnosis not present

## 2023-01-20 DIAGNOSIS — H8110 Benign paroxysmal vertigo, unspecified ear: Secondary | ICD-10-CM | POA: Diagnosis not present

## 2023-01-20 DIAGNOSIS — F02C0 Dementia in other diseases classified elsewhere, severe, without behavioral disturbance, psychotic disturbance, mood disturbance, and anxiety: Secondary | ICD-10-CM | POA: Diagnosis not present

## 2023-01-20 DIAGNOSIS — Z48812 Encounter for surgical aftercare following surgery on the circulatory system: Secondary | ICD-10-CM | POA: Diagnosis not present

## 2023-01-20 DIAGNOSIS — M25531 Pain in right wrist: Secondary | ICD-10-CM | POA: Diagnosis not present

## 2023-01-20 DIAGNOSIS — R400 Somnolence: Secondary | ICD-10-CM | POA: Diagnosis not present

## 2023-01-20 DIAGNOSIS — N181 Chronic kidney disease, stage 1: Secondary | ICD-10-CM | POA: Diagnosis not present

## 2023-01-20 DIAGNOSIS — G300 Alzheimer's disease with early onset: Secondary | ICD-10-CM | POA: Diagnosis not present

## 2023-01-20 DIAGNOSIS — N179 Acute kidney failure, unspecified: Secondary | ICD-10-CM | POA: Diagnosis not present

## 2023-01-20 DIAGNOSIS — I10 Essential (primary) hypertension: Secondary | ICD-10-CM | POA: Diagnosis not present

## 2023-01-20 DIAGNOSIS — E1110 Type 2 diabetes mellitus with ketoacidosis without coma: Secondary | ICD-10-CM | POA: Diagnosis not present

## 2023-01-20 DIAGNOSIS — M6281 Muscle weakness (generalized): Secondary | ICD-10-CM | POA: Diagnosis not present

## 2023-01-20 DIAGNOSIS — G6281 Critical illness polyneuropathy: Secondary | ICD-10-CM | POA: Diagnosis not present

## 2023-01-20 DIAGNOSIS — E11649 Type 2 diabetes mellitus with hypoglycemia without coma: Secondary | ICD-10-CM | POA: Diagnosis not present

## 2023-01-20 DIAGNOSIS — F319 Bipolar disorder, unspecified: Secondary | ICD-10-CM | POA: Diagnosis not present

## 2023-01-21 DIAGNOSIS — B0229 Other postherpetic nervous system involvement: Secondary | ICD-10-CM | POA: Diagnosis not present

## 2023-01-21 DIAGNOSIS — Z79899 Other long term (current) drug therapy: Secondary | ICD-10-CM | POA: Diagnosis not present

## 2023-01-21 DIAGNOSIS — F419 Anxiety disorder, unspecified: Secondary | ICD-10-CM | POA: Diagnosis not present

## 2023-01-27 DIAGNOSIS — Z982 Presence of cerebrospinal fluid drainage device: Secondary | ICD-10-CM | POA: Diagnosis not present

## 2023-01-27 DIAGNOSIS — R062 Wheezing: Secondary | ICD-10-CM | POA: Diagnosis not present

## 2023-01-27 DIAGNOSIS — F3132 Bipolar disorder, current episode depressed, moderate: Secondary | ICD-10-CM | POA: Diagnosis not present

## 2023-01-27 DIAGNOSIS — F29 Unspecified psychosis not due to a substance or known physiological condition: Secondary | ICD-10-CM | POA: Diagnosis not present

## 2023-01-27 DIAGNOSIS — E11649 Type 2 diabetes mellitus with hypoglycemia without coma: Secondary | ICD-10-CM | POA: Diagnosis not present

## 2023-01-27 DIAGNOSIS — N181 Chronic kidney disease, stage 1: Secondary | ICD-10-CM | POA: Diagnosis not present

## 2023-01-27 DIAGNOSIS — J45909 Unspecified asthma, uncomplicated: Secondary | ICD-10-CM | POA: Diagnosis not present

## 2023-01-27 DIAGNOSIS — R4182 Altered mental status, unspecified: Secondary | ICD-10-CM | POA: Diagnosis not present

## 2023-01-27 DIAGNOSIS — N179 Acute kidney failure, unspecified: Secondary | ICD-10-CM | POA: Diagnosis not present

## 2023-01-27 DIAGNOSIS — F319 Bipolar disorder, unspecified: Secondary | ICD-10-CM | POA: Diagnosis not present

## 2023-01-27 DIAGNOSIS — H8110 Benign paroxysmal vertigo, unspecified ear: Secondary | ICD-10-CM | POA: Diagnosis not present

## 2023-01-27 DIAGNOSIS — R400 Somnolence: Secondary | ICD-10-CM | POA: Diagnosis not present

## 2023-01-27 DIAGNOSIS — F02C0 Dementia in other diseases classified elsewhere, severe, without behavioral disturbance, psychotic disturbance, mood disturbance, and anxiety: Secondary | ICD-10-CM | POA: Diagnosis not present

## 2023-01-27 DIAGNOSIS — G6289 Other specified polyneuropathies: Secondary | ICD-10-CM | POA: Diagnosis not present

## 2023-01-27 DIAGNOSIS — G300 Alzheimer's disease with early onset: Secondary | ICD-10-CM | POA: Diagnosis not present

## 2023-01-27 DIAGNOSIS — E1110 Type 2 diabetes mellitus with ketoacidosis without coma: Secondary | ICD-10-CM | POA: Diagnosis not present

## 2023-01-27 DIAGNOSIS — M6281 Muscle weakness (generalized): Secondary | ICD-10-CM | POA: Diagnosis not present

## 2023-01-27 DIAGNOSIS — Z48812 Encounter for surgical aftercare following surgery on the circulatory system: Secondary | ICD-10-CM | POA: Diagnosis not present

## 2023-01-27 DIAGNOSIS — E1169 Type 2 diabetes mellitus with other specified complication: Secondary | ICD-10-CM | POA: Diagnosis not present

## 2023-01-27 DIAGNOSIS — G6281 Critical illness polyneuropathy: Secondary | ICD-10-CM | POA: Diagnosis not present

## 2023-01-27 DIAGNOSIS — F02B3 Dementia in other diseases classified elsewhere, moderate, with mood disturbance: Secondary | ICD-10-CM | POA: Diagnosis not present

## 2023-01-27 DIAGNOSIS — R0981 Nasal congestion: Secondary | ICD-10-CM | POA: Diagnosis not present

## 2023-01-27 DIAGNOSIS — I10 Essential (primary) hypertension: Secondary | ICD-10-CM | POA: Diagnosis not present

## 2023-01-27 DIAGNOSIS — E1165 Type 2 diabetes mellitus with hyperglycemia: Secondary | ICD-10-CM | POA: Diagnosis not present

## 2023-01-27 DIAGNOSIS — M25531 Pain in right wrist: Secondary | ICD-10-CM | POA: Diagnosis not present

## 2023-02-03 DIAGNOSIS — E1165 Type 2 diabetes mellitus with hyperglycemia: Secondary | ICD-10-CM | POA: Diagnosis not present

## 2023-02-03 DIAGNOSIS — G300 Alzheimer's disease with early onset: Secondary | ICD-10-CM | POA: Diagnosis not present

## 2023-02-03 DIAGNOSIS — F3132 Bipolar disorder, current episode depressed, moderate: Secondary | ICD-10-CM | POA: Diagnosis not present

## 2023-02-03 DIAGNOSIS — H8110 Benign paroxysmal vertigo, unspecified ear: Secondary | ICD-10-CM | POA: Diagnosis not present

## 2023-02-03 DIAGNOSIS — N181 Chronic kidney disease, stage 1: Secondary | ICD-10-CM | POA: Diagnosis not present

## 2023-02-03 DIAGNOSIS — E1169 Type 2 diabetes mellitus with other specified complication: Secondary | ICD-10-CM | POA: Diagnosis not present

## 2023-02-03 DIAGNOSIS — J45909 Unspecified asthma, uncomplicated: Secondary | ICD-10-CM | POA: Diagnosis not present

## 2023-02-03 DIAGNOSIS — G6289 Other specified polyneuropathies: Secondary | ICD-10-CM | POA: Diagnosis not present

## 2023-02-03 DIAGNOSIS — M6281 Muscle weakness (generalized): Secondary | ICD-10-CM | POA: Diagnosis not present

## 2023-02-03 DIAGNOSIS — M25531 Pain in right wrist: Secondary | ICD-10-CM | POA: Diagnosis not present

## 2023-02-03 DIAGNOSIS — R0981 Nasal congestion: Secondary | ICD-10-CM | POA: Diagnosis not present

## 2023-02-03 DIAGNOSIS — F319 Bipolar disorder, unspecified: Secondary | ICD-10-CM | POA: Diagnosis not present

## 2023-02-03 DIAGNOSIS — I10 Essential (primary) hypertension: Secondary | ICD-10-CM | POA: Diagnosis not present

## 2023-02-03 DIAGNOSIS — F02C0 Dementia in other diseases classified elsewhere, severe, without behavioral disturbance, psychotic disturbance, mood disturbance, and anxiety: Secondary | ICD-10-CM | POA: Diagnosis not present

## 2023-02-03 DIAGNOSIS — G6281 Critical illness polyneuropathy: Secondary | ICD-10-CM | POA: Diagnosis not present

## 2023-02-03 DIAGNOSIS — F29 Unspecified psychosis not due to a substance or known physiological condition: Secondary | ICD-10-CM | POA: Diagnosis not present

## 2023-02-03 DIAGNOSIS — Z48812 Encounter for surgical aftercare following surgery on the circulatory system: Secondary | ICD-10-CM | POA: Diagnosis not present

## 2023-02-03 DIAGNOSIS — E1110 Type 2 diabetes mellitus with ketoacidosis without coma: Secondary | ICD-10-CM | POA: Diagnosis not present

## 2023-02-03 DIAGNOSIS — E11649 Type 2 diabetes mellitus with hypoglycemia without coma: Secondary | ICD-10-CM | POA: Diagnosis not present

## 2023-02-03 DIAGNOSIS — R062 Wheezing: Secondary | ICD-10-CM | POA: Diagnosis not present

## 2023-02-03 DIAGNOSIS — Z982 Presence of cerebrospinal fluid drainage device: Secondary | ICD-10-CM | POA: Diagnosis not present

## 2023-02-03 DIAGNOSIS — N179 Acute kidney failure, unspecified: Secondary | ICD-10-CM | POA: Diagnosis not present

## 2023-02-03 DIAGNOSIS — F02B3 Dementia in other diseases classified elsewhere, moderate, with mood disturbance: Secondary | ICD-10-CM | POA: Diagnosis not present

## 2023-02-03 DIAGNOSIS — R4182 Altered mental status, unspecified: Secondary | ICD-10-CM | POA: Diagnosis not present

## 2023-02-03 DIAGNOSIS — R400 Somnolence: Secondary | ICD-10-CM | POA: Diagnosis not present

## 2023-02-05 NOTE — H&P (View-Only) (Signed)
Surgical History & Physical  Patient Name: Carolyn Norton DOB: 11/11/1965  Surgery: Cataract extraction with intraocular lens implant phacoemulsification; Left Eye  Surgeon: Yui Mulvaney MD Surgery Date:  02-12-23 Pre-Op Date:  01-14-23  HPI: A 56 Yr. old female patient is referred by Dr Johnson for cataract eval. 1. The patient complains of difficulty when driving/ seeing small captions on TV, which began more than 1 year ago. Both eyes are affected. The episode is gradual. The condition's severity increased since last visit. Symptoms occur when the patient is driving, inside, outside and reading. The complaint is associated with glare. pt quit driving due to blurry vision. This is negatively affecting the patient's quality of life and the patient is unable to function adequately in life with the current level of vision. HPI was performed by Sondi Desch .  Medical History: Cataracts Diabetes High Blood Pressure LDL Lung Problems depression, acid reflux, bipolar disorder, dementi...  Review of Systems Negative Allergic/Immunologic Negative Cardiovascular Negative Constitutional Negative Ear, Nose, Mouth & Throat Negative Endocrine Negative Eyes Negative Gastrointestinal Negative Genitourinary Negative Hemotologic/Lymphatic Negative Integumentary Negative Musculoskeletal Negative Neurological Negative Psychiatry Negative Respiratory  Social   Former smoker   Medication Amlodipine, Atorvastatin, Divalproex, Latuda, Metformin, Mirtazapine, Tramadol, Humalog, Trulicity, Albuterol, Bisacodyl, Famotidine, Metoprolol,   Sx/Procedures Breast cyst removals,   Drug Allergies  Erythromycin,   History & Physical: Heent: cataract, left eye NECK: supple without bruits LUNGS: lungs clear to auscultation CV: regular rate and rhythm Abdomen: soft and non-tender Impression & Plan: Assessment: 1.  COMBINED FORMS AGE RELATED CATARACT; Both Eyes (H25.813) 2.  ASTIGMATISM,  REGULAR; Both Eyes (H52.223) 3.  DERMATOCHALASIS, no surgery; Right Upper Lid, Left Upper Lid (H02.831, H02.834) 4.  BLEPHARITIS; Right Upper Lid, Right Lower Lid, Left Upper Lid, Left Lower Lid (H01.001, H01.002,H01.004,H01.005) 5.  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  Pinguecula; Both Eyes (H11.153)  Plan: 1.  Cataract accounts for the patient's decreased vision. This visual impairment is not correctable with a tolerable change in glasses or contact lenses. Cataract surgery with an implantation of a new lens should significantly improve the visual and functional status of the patient. Discussed all risks, benefits, alternatives, and potential complications. Discussed the procedures and recovery. Patient desires to have surgery. A-scan ordered and performed today for intra-ocular lens calculations. The surgery will be performed in order to improve vision for driving, reading, and for eye examinations. Recommend phacoemulsification with intra-ocular lens. Recommend Dextenza for post-operative pain and inflammation. Left Eye non-dominant - first.. Dilates well - shugarcaine by protocol.  2.  Mild  3.  Asymptomatic, recommend observation for now. Findings, prognosis and treatment options reviewed.  4.  Recommend regular lid cleaning. Warm compresses 7-10 minutes every day, both eyes.  5.  New. Symptomatic. Findings, prognosis and treatment options reviewed. Will limit final visual potential. Will examine further after cataract surgery.  6.  Observe; Artificial tears as needed for irritation. 

## 2023-02-05 NOTE — H&P (Signed)
Surgical History & Physical  Patient Name: Carolyn Norton DOB: 20-Mar-1966  Surgery: Cataract extraction with intraocular lens implant phacoemulsification; Left Eye  Surgeon: Fabio Pierce MD Surgery Date:  02-12-23 Pre-Op Date:  01-14-23  HPI: A 59 Yr. old female patient is referred by Dr Laural Benes for cataract eval. 1. The patient complains of difficulty when driving/ seeing small captions on TV, which began more than 1 year ago. Both eyes are affected. The episode is gradual. The condition's severity increased since last visit. Symptoms occur when the patient is driving, inside, outside and reading. The complaint is associated with glare. pt quit driving due to blurry vision. This is negatively affecting the patient's quality of life and the patient is unable to function adequately in life with the current level of vision. HPI was performed by Fabio Pierce .  Medical History: Cataracts Diabetes High Blood Pressure LDL Lung Problems depression, acid reflux, bipolar disorder, dementi...  Review of Systems Negative Allergic/Immunologic Negative Cardiovascular Negative Constitutional Negative Ear, Nose, Mouth & Throat Negative Endocrine Negative Eyes Negative Gastrointestinal Negative Genitourinary Negative Hemotologic/Lymphatic Negative Integumentary Negative Musculoskeletal Negative Neurological Negative Psychiatry Negative Respiratory  Social   Former smoker   Medication Amlodipine, Atorvastatin, Divalproex, Latuda, Metformin, Mirtazapine, Tramadol, Humalog, Trulicity, Albuterol, Bisacodyl, Famotidine, Metoprolol,   Sx/Procedures Breast cyst removals,   Drug Allergies  Erythromycin,   History & Physical: Heent: cataract, left eye NECK: supple without bruits LUNGS: lungs clear to auscultation CV: regular rate and rhythm Abdomen: soft and non-tender Impression & Plan: Assessment: 1.  COMBINED FORMS AGE RELATED CATARACT; Both Eyes (H25.813) 2.  ASTIGMATISM,  REGULAR; Both Eyes (H52.223) 3.  DERMATOCHALASIS, no surgery; Right Upper Lid, Left Upper Lid (H02.831, H02.834) 4.  BLEPHARITIS; Right Upper Lid, Right Lower Lid, Left Upper Lid, Left Lower Lid (H01.001, H01.002,H01.004,H01.005) 5.  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  Pinguecula; Both Eyes (H11.153)  Plan: 1.  Cataract accounts for the patient's decreased vision. This visual impairment is not correctable with a tolerable change in glasses or contact lenses. Cataract surgery with an implantation of a new lens should significantly improve the visual and functional status of the patient. Discussed all risks, benefits, alternatives, and potential complications. Discussed the procedures and recovery. Patient desires to have surgery. A-scan ordered and performed today for intra-ocular lens calculations. The surgery will be performed in order to improve vision for driving, reading, and for eye examinations. Recommend phacoemulsification with intra-ocular lens. Recommend Dextenza for post-operative pain and inflammation. Left Eye non-dominant - first.. Dilates well - shugarcaine by protocol.  2.  Mild  3.  Asymptomatic, recommend observation for now. Findings, prognosis and treatment options reviewed.  4.  Recommend regular lid cleaning. Warm compresses 7-10 minutes every day, both eyes.  5.  New. Symptomatic. Findings, prognosis and treatment options reviewed. Will limit final visual potential. Will examine further after cataract surgery.  6.  Observe; Artificial tears as needed for irritation.

## 2023-02-06 DIAGNOSIS — R062 Wheezing: Secondary | ICD-10-CM | POA: Diagnosis not present

## 2023-02-06 DIAGNOSIS — E11649 Type 2 diabetes mellitus with hypoglycemia without coma: Secondary | ICD-10-CM | POA: Diagnosis not present

## 2023-02-06 DIAGNOSIS — F02B3 Dementia in other diseases classified elsewhere, moderate, with mood disturbance: Secondary | ICD-10-CM | POA: Diagnosis not present

## 2023-02-06 DIAGNOSIS — R0981 Nasal congestion: Secondary | ICD-10-CM | POA: Diagnosis not present

## 2023-02-06 DIAGNOSIS — M6281 Muscle weakness (generalized): Secondary | ICD-10-CM | POA: Diagnosis not present

## 2023-02-06 DIAGNOSIS — F02C0 Dementia in other diseases classified elsewhere, severe, without behavioral disturbance, psychotic disturbance, mood disturbance, and anxiety: Secondary | ICD-10-CM | POA: Diagnosis not present

## 2023-02-06 DIAGNOSIS — I10 Essential (primary) hypertension: Secondary | ICD-10-CM | POA: Diagnosis not present

## 2023-02-06 DIAGNOSIS — G300 Alzheimer's disease with early onset: Secondary | ICD-10-CM | POA: Diagnosis not present

## 2023-02-06 DIAGNOSIS — F3132 Bipolar disorder, current episode depressed, moderate: Secondary | ICD-10-CM | POA: Diagnosis not present

## 2023-02-06 DIAGNOSIS — F319 Bipolar disorder, unspecified: Secondary | ICD-10-CM | POA: Diagnosis not present

## 2023-02-06 DIAGNOSIS — R400 Somnolence: Secondary | ICD-10-CM | POA: Diagnosis not present

## 2023-02-06 DIAGNOSIS — N179 Acute kidney failure, unspecified: Secondary | ICD-10-CM | POA: Diagnosis not present

## 2023-02-06 DIAGNOSIS — Z982 Presence of cerebrospinal fluid drainage device: Secondary | ICD-10-CM | POA: Diagnosis not present

## 2023-02-06 DIAGNOSIS — E1110 Type 2 diabetes mellitus with ketoacidosis without coma: Secondary | ICD-10-CM | POA: Diagnosis not present

## 2023-02-06 DIAGNOSIS — G6281 Critical illness polyneuropathy: Secondary | ICD-10-CM | POA: Diagnosis not present

## 2023-02-06 DIAGNOSIS — E1165 Type 2 diabetes mellitus with hyperglycemia: Secondary | ICD-10-CM | POA: Diagnosis not present

## 2023-02-06 DIAGNOSIS — M25531 Pain in right wrist: Secondary | ICD-10-CM | POA: Diagnosis not present

## 2023-02-06 DIAGNOSIS — G6289 Other specified polyneuropathies: Secondary | ICD-10-CM | POA: Diagnosis not present

## 2023-02-06 DIAGNOSIS — H8110 Benign paroxysmal vertigo, unspecified ear: Secondary | ICD-10-CM | POA: Diagnosis not present

## 2023-02-06 DIAGNOSIS — Z48812 Encounter for surgical aftercare following surgery on the circulatory system: Secondary | ICD-10-CM | POA: Diagnosis not present

## 2023-02-06 DIAGNOSIS — N181 Chronic kidney disease, stage 1: Secondary | ICD-10-CM | POA: Diagnosis not present

## 2023-02-06 DIAGNOSIS — F29 Unspecified psychosis not due to a substance or known physiological condition: Secondary | ICD-10-CM | POA: Diagnosis not present

## 2023-02-06 DIAGNOSIS — E1169 Type 2 diabetes mellitus with other specified complication: Secondary | ICD-10-CM | POA: Diagnosis not present

## 2023-02-06 DIAGNOSIS — J45909 Unspecified asthma, uncomplicated: Secondary | ICD-10-CM | POA: Diagnosis not present

## 2023-02-06 DIAGNOSIS — R4182 Altered mental status, unspecified: Secondary | ICD-10-CM | POA: Diagnosis not present

## 2023-02-08 DIAGNOSIS — H25812 Combined forms of age-related cataract, left eye: Secondary | ICD-10-CM | POA: Diagnosis not present

## 2023-02-09 ENCOUNTER — Encounter (HOSPITAL_COMMUNITY): Payer: Self-pay

## 2023-02-09 ENCOUNTER — Encounter (HOSPITAL_COMMUNITY)
Admission: RE | Admit: 2023-02-09 | Discharge: 2023-02-09 | Disposition: A | Discharge status: Home / Self Care | Payer: Medicaid Other | Source: Ambulatory Visit | Attending: Ophthalmology | Admitting: Ophthalmology

## 2023-02-09 ENCOUNTER — Other Ambulatory Visit: Payer: Self-pay

## 2023-02-09 NOTE — Pre-Procedure Instructions (Signed)
Spoke with "Belinda" at [facility] about patient. Belinda requested that I faxed pre-op instructions to her at [phone]. Faxed instructions per request at this time.

## 2023-02-09 NOTE — Patient Instructions (Signed)
    Carolyn Norton  02/09/2023     @PREFPERIOPPHARMACY @   Your procedure is scheduled on  02/12/2023.   Report to [facility] at  0900  A.M.   Call this number if you have problems the morning of surgery:  [phone]  If you experience any cold or flu symptoms such as cough, fever, chills, shortness of breath, etc. between now and your scheduled surgery, please notify us at the above number.   Remember:  Do not eat or drink after midnight.        Take 7.5 units of levemir the night before your procedure.      DO NOT take any medications for diabetes the morning of your procedure.      Take these medicines the morning of surgery with A SIP OF WATER                 amlodipine, tramdol(if needed), depakote, pepcid, metoprolol, zoloft.      Do not wear jewelry, make-up or nail polish, including gel polish,  artificial nails, or any other type of covering on natural nails (fingers and  toes).  Do not wear lotions, powders, or perfumes, or deodorant.  Do not shave 48 hours prior to surgery.  Men may shave face and neck.  Do not bring valuables to the hospital.  [facility] is not responsible for any belongings or valuables.  Contacts, dentures or bridgework may not be worn into surgery.  Leave your suitcase in the car.  After surgery it may be brought to your room.  For patients admitted to the hospital, discharge time will be determined by your treatment team.  Patients discharged the day of surgery will not be allowed to drive home and must have someone with them for 24 hours.    Special instructions:  DO NOT smoke tobacco or vape for 24 hours before your procedure.   Please read over the following fact sheets that you were given. Surgical Site Infection Prevention, Anesthesia Post-op Instructions, and Care and Recovery After Surgery

## 2023-02-12 ENCOUNTER — Ambulatory Visit (HOSPITAL_BASED_OUTPATIENT_CLINIC_OR_DEPARTMENT_OTHER): Payer: Medicaid Other | Admitting: Certified Registered Nurse Anesthetist

## 2023-02-12 ENCOUNTER — Ambulatory Visit (HOSPITAL_COMMUNITY): Payer: Medicaid Other | Admitting: Certified Registered Nurse Anesthetist

## 2023-02-12 ENCOUNTER — Encounter (HOSPITAL_COMMUNITY)
Admission: RE | Disposition: A | Discharge status: Home / Self Care | Payer: Self-pay | Source: Ambulatory Visit | Attending: Ophthalmology

## 2023-02-12 ENCOUNTER — Encounter (HOSPITAL_COMMUNITY): Payer: Self-pay | Admitting: Ophthalmology

## 2023-02-12 ENCOUNTER — Ambulatory Visit (HOSPITAL_COMMUNITY)
Admission: RE | Admit: 2023-02-12 | Discharge: 2023-02-12 | Disposition: A | Discharge status: Home / Self Care | Payer: Medicaid Other | Source: Ambulatory Visit | Attending: Ophthalmology | Admitting: Ophthalmology

## 2023-02-12 DIAGNOSIS — E785 Hyperlipidemia, unspecified: Secondary | ICD-10-CM | POA: Insufficient documentation

## 2023-02-12 DIAGNOSIS — I10 Essential (primary) hypertension: Secondary | ICD-10-CM | POA: Insufficient documentation

## 2023-02-12 DIAGNOSIS — F419 Anxiety disorder, unspecified: Secondary | ICD-10-CM | POA: Diagnosis not present

## 2023-02-12 DIAGNOSIS — Z79899 Other long term (current) drug therapy: Secondary | ICD-10-CM | POA: Insufficient documentation

## 2023-02-12 DIAGNOSIS — E1136 Type 2 diabetes mellitus with diabetic cataract: Secondary | ICD-10-CM | POA: Insufficient documentation

## 2023-02-12 DIAGNOSIS — H25812 Combined forms of age-related cataract, left eye: Secondary | ICD-10-CM | POA: Insufficient documentation

## 2023-02-12 DIAGNOSIS — F17200 Nicotine dependence, unspecified, uncomplicated: Secondary | ICD-10-CM | POA: Insufficient documentation

## 2023-02-12 DIAGNOSIS — F17210 Nicotine dependence, cigarettes, uncomplicated: Secondary | ICD-10-CM | POA: Diagnosis not present

## 2023-02-12 DIAGNOSIS — F319 Bipolar disorder, unspecified: Secondary | ICD-10-CM | POA: Insufficient documentation

## 2023-02-12 DIAGNOSIS — J45909 Unspecified asthma, uncomplicated: Secondary | ICD-10-CM | POA: Insufficient documentation

## 2023-02-12 DIAGNOSIS — F0390 Unspecified dementia without behavioral disturbance: Secondary | ICD-10-CM | POA: Diagnosis not present

## 2023-02-12 HISTORY — PX: CATARACT EXTRACTION W/PHACO: SHX586

## 2023-02-12 LAB — GLUCOSE, CAPILLARY: Glucose-Capillary: 119 mg/dL — ABNORMAL HIGH (ref 70–99)

## 2023-02-12 SURGERY — PHACOEMULSIFICATION, CATARACT, WITH IOL INSERTION
Anesthesia: Monitor Anesthesia Care | Site: Eye | Laterality: Left

## 2023-02-12 MED ORDER — MIDAZOLAM HCL 2 MG/2ML IJ SOLN
INTRAMUSCULAR | Status: AC
  Filled 2023-02-12: qty 2

## 2023-02-12 MED ORDER — LIDOCAINE HCL 3.5 % OP GEL
1.0000 | Freq: Once | OPHTHALMIC | Status: AC
  Administered 2023-02-12: 1 via OPHTHALMIC

## 2023-02-12 MED ORDER — SODIUM HYALURONATE 23MG/ML IO SOSY
PREFILLED_SYRINGE | INTRAOCULAR | Status: DC | PRN
  Administered 2023-02-12: .6 mL via INTRAOCULAR

## 2023-02-12 MED ORDER — BSS IO SOLN
INTRAOCULAR | Status: DC | PRN
  Administered 2023-02-12: 15 mL via INTRAOCULAR

## 2023-02-12 MED ORDER — TROPICAMIDE 1 % OP SOLN
1.0000 [drp] | OPHTHALMIC | Status: AC | PRN
  Administered 2023-02-12 (×3): 1 [drp] via OPHTHALMIC

## 2023-02-12 MED ORDER — MIDAZOLAM HCL 5 MG/5ML IJ SOLN
INTRAMUSCULAR | Status: DC | PRN
  Administered 2023-02-12: 1 mg via INTRAVENOUS

## 2023-02-12 MED ORDER — POVIDONE-IODINE 5 % OP SOLN
OPHTHALMIC | Status: DC | PRN
  Administered 2023-02-12: 1 via OPHTHALMIC

## 2023-02-12 MED ORDER — PHENYLEPHRINE HCL 2.5 % OP SOLN
1.0000 [drp] | OPHTHALMIC | Status: AC | PRN
  Administered 2023-02-12 (×3): 1 [drp] via OPHTHALMIC

## 2023-02-12 MED ORDER — LIDOCAINE HCL (PF) 1 % IJ SOLN
INTRAOCULAR | Status: DC | PRN
  Administered 2023-02-12: 1 mL via OPHTHALMIC

## 2023-02-12 MED ORDER — PHENYLEPHRINE-KETOROLAC 1-0.3 % IO SOLN
INTRAOCULAR | Status: AC
  Filled 2023-02-12: qty 4

## 2023-02-12 MED ORDER — STERILE WATER FOR IRRIGATION IR SOLN
Status: DC | PRN
  Administered 2023-02-12: 250 mL

## 2023-02-12 MED ORDER — ONDANSETRON HCL 4 MG/2ML IJ SOLN
INTRAMUSCULAR | Status: DC | PRN
  Administered 2023-02-12: 4 mg via INTRAVENOUS

## 2023-02-12 MED ORDER — EPINEPHRINE PF 1 MG/ML IJ SOLN
INTRAMUSCULAR | Status: AC
  Filled 2023-02-12: qty 1

## 2023-02-12 MED ORDER — TETRACAINE HCL 0.5 % OP SOLN
1.0000 [drp] | OPHTHALMIC | Status: AC | PRN
  Administered 2023-02-12 (×3): 1 [drp] via OPHTHALMIC

## 2023-02-12 MED ORDER — EPINEPHRINE PF 1 MG/ML IJ SOLN
INTRAOCULAR | Status: DC | PRN
  Administered 2023-02-12: 500 mL

## 2023-02-12 MED ORDER — SODIUM HYALURONATE 10 MG/ML IO SOLUTION
PREFILLED_SYRINGE | INTRAOCULAR | Status: DC | PRN
  Administered 2023-02-12: .85 mL via INTRAOCULAR

## 2023-02-12 SURGICAL SUPPLY — 14 items
CATARACT SUITE SIGHTPATH (MISCELLANEOUS) ×1 IMPLANT
CLOTH BEACON ORANGE TIMEOUT ST (SAFETY) ×2 IMPLANT
EYE SHIELD UNIVERSAL CLEAR (GAUZE/BANDAGES/DRESSINGS) IMPLANT
FEE CATARACT SUITE SIGHTPATH (MISCELLANEOUS) ×2 IMPLANT
GLOVE BIOGEL PI IND STRL 7.0 (GLOVE) ×4 IMPLANT
KIT SLEEVE INFUSION .9 MICRO (MISCELLANEOUS) IMPLANT
LENS IOL TECNIS EYHANCE 21.5 (Intraocular Lens) IMPLANT
NDL HYPO 18GX1.5 BLUNT FILL (NEEDLE) ×2 IMPLANT
NEEDLE HYPO 18GX1.5 BLUNT FILL (NEEDLE) ×1 IMPLANT
PAD ARMBOARD 7.5X6 YLW CONV (MISCELLANEOUS) ×2 IMPLANT
RING MALYGIN 7.0 (MISCELLANEOUS) IMPLANT
SYR TB 1ML LL NO SAFETY (SYRINGE) ×2 IMPLANT
TAPE SURG TRANSPORE 1 IN (GAUZE/BANDAGES/DRESSINGS) IMPLANT
WATER STERILE IRR 250ML POUR (IV SOLUTION) ×2 IMPLANT

## 2023-02-12 NOTE — Interval H&P Note (Signed)
History and Physical Interval Note:  02/12/2023 10:45 AM  Carolyn Norton  has presented today for surgery, with the diagnosis of combined forms age related cataract; left.  The various methods of treatment have been discussed with the patient and family. After consideration of risks, benefits and other options for treatment, the patient has consented to  Procedure(s): CATARACT EXTRACTION PHACO AND INTRAOCULAR LENS PLACEMENT (IOC) (Left) as a surgical intervention.  The patient's history has been reviewed, patient examined, no change in status, stable for surgery.  I have reviewed the patient's chart and labs.  Questions were answered to the patient's satisfaction.     Fabio Pierce

## 2023-02-12 NOTE — Transfer of Care (Signed)
Immediate Anesthesia Transfer of Care Note  Patient: Carolyn Norton  Procedure(s) Performed: CATARACT EXTRACTION PHACO AND INTRAOCULAR LENS PLACEMENT (IOC) (Left: Eye)  Patient Location: PACU and Short Stay  Anesthesia Type:MAC  Level of Consciousness: awake, alert , and oriented  Airway & Oxygen Therapy: Patient Spontanous Breathing  Post-op Assessment: Report given to RN  Post vital signs: Reviewed and stable  Last Vitals:  Vitals Value Taken Time  BP    Temp    Pulse    Resp    SpO2      Last Pain:  Vitals:   02/12/23 1014  PainSc: 0-No pain         Complications: No notable events documented.

## 2023-02-12 NOTE — Op Note (Signed)
Date of procedure: 02/12/23  Pre-operative diagnosis: Visually significant age-related combined cataract, Left Eye (H25.812)  Post-operative diagnosis: Visually significant age-related combined cataract, Left Eye (H25.812)  Procedure: Removal of cataract via phacoemulsification and insertion of intra-ocular lens Laural Benes and Johnson DIB00 +21.5D into the capsular bag of the Left Eye  Attending surgeon: Rudy Jew. Delisia Mcquiston, MD, MA  Anesthesia: MAC, Topical Akten  Complications: None  Estimated Blood Loss: <62mL (minimal)  Specimens: None  Implants: As above  Indications:  Visually significant age-related cataract, Left Eye  Procedure:  The patient was seen and identified in the pre-operative area. The operative eye was identified and dilated.  The operative eye was marked.  Topical anesthesia was administered to the operative eye.     The patient was then to the operative suite and placed in the supine position.  A timeout was performed confirming the patient, procedure to be performed, and all other relevant information.   The patient's face was prepped and draped in the usual fashion for intra-ocular surgery.  A lid speculum was placed into the operative eye and the surgical microscope moved into place and focused.  An inferotemporal paracentesis was created using a 20 gauge paracentesis blade.  Shugarcaine was injected into the anterior chamber.  Viscoelastic was injected into the anterior chamber.  A temporal clear-corneal main wound incision was created using a 2.63mm microkeratome.  A continuous curvilinear capsulorrhexis was initiated using an irrigating cystitome and completed using capsulorrhexis forceps.  Hydrodissection and hydrodeliniation were performed.  Viscoelastic was injected into the anterior chamber.  A phacoemulsification handpiece and a chopper as a second instrument were used to remove the nucleus and epinucleus. The irrigation/aspiration handpiece was used to remove any  remaining cortical material.   The capsular bag was reinflated with viscoelastic, checked, and found to be intact.  The intraocular lens was inserted into the capsular bag.  The irrigation/aspiration handpiece was used to remove any remaining viscoelastic.  The clear corneal wound and paracentesis wounds were then hydrated and checked with Weck-Cels to be watertight. The lid-speculum was removed.  The drape was removed.  The patient's face was cleaned with a wet and dry 4x4.    A clear shield was taped over the eye. The patient was taken to the post-operative care unit in good condition, having tolerated the procedure well.  Post-Op Instructions: The patient will follow up at [facility] for a same day post-operative evaluation and will receive all other orders and instructions.

## 2023-02-12 NOTE — Discharge Instructions (Signed)
Please discharge patient when stable, will follow up today with Dr. Hernan Turnage at the [facility] [place] office immediately following discharge.  Leave shield in place until visit.  All paperwork with discharge instructions will be given at the office.  [facility] Address:  [address]  [place], [place] [address]  

## 2023-02-12 NOTE — Anesthesia Preprocedure Evaluation (Signed)
Anesthesia Evaluation  Patient identified by MRN, date of birth, ID band Patient awake    Reviewed: Allergy & Precautions, H&P , NPO status , Patient's Chart, lab work & pertinent test results, reviewed documented beta blocker date and time   Airway Mallampati: II  TM Distance: >3 FB Neck ROM: full    Dental no notable dental hx.    Pulmonary neg pulmonary ROS, asthma , Current Smoker   Pulmonary exam normal breath sounds clear to auscultation       Cardiovascular Exercise Tolerance: Good hypertension, negative cardio ROS  Rhythm:regular Rate:Normal     Neuro/Psych  PSYCHIATRIC DISORDERS Anxiety Depression Bipolar Disorder  Dementia negative neurological ROS  negative psych ROS   GI/Hepatic negative GI ROS, Neg liver ROS,,,  Endo/Other  negative endocrine ROSdiabetes    Renal/GU Renal diseasenegative Renal ROS  negative genitourinary   Musculoskeletal   Abdominal   Peds  Hematology negative hematology ROS (+)   Anesthesia Other Findings   Reproductive/Obstetrics negative OB ROS                             Anesthesia Physical Anesthesia Plan  ASA: 3  Anesthesia Plan: MAC   Post-op Pain Management:    Induction:   PONV Risk Score and Plan:   Airway Management Planned:   Additional Equipment:   Intra-op Plan:   Post-operative Plan:   Informed Consent: I have reviewed the patients History and Physical, chart, labs and discussed the procedure including the risks, benefits and alternatives for the proposed anesthesia with the patient or authorized representative who has indicated his/her understanding and acceptance.     Dental Advisory Given  Plan Discussed with: CRNA  Anesthesia Plan Comments:        Anesthesia Quick Evaluation

## 2023-02-12 NOTE — Anesthesia Postprocedure Evaluation (Signed)
Anesthesia Post Note  Patient: SAOIRSE LEGERE  Procedure(s) Performed: CATARACT EXTRACTION PHACO AND INTRAOCULAR LENS PLACEMENT (IOC) (Left: Eye)  Patient location during evaluation: Phase II Anesthesia Type: MAC Level of consciousness: awake Pain management: pain level controlled Vital Signs Assessment: post-procedure vital signs reviewed and stable Respiratory status: spontaneous breathing and respiratory function stable Cardiovascular status: blood pressure returned to baseline and stable Postop Assessment: no headache and no apparent nausea or vomiting Anesthetic complications: no Comments: Late entry   No notable events documented.   Last Vitals:  Vitals:   02/12/23 1030 02/12/23 1117  BP: 96/65 118/75  Pulse: 75 81  Resp: (!) 7 15  Temp:  36.4 C  SpO2: 99% 100%    Last Pain:  Vitals:   02/12/23 1117  TempSrc: Oral  PainSc: 0-No pain                 Windell Norfolk

## 2023-02-16 ENCOUNTER — Encounter (HOSPITAL_COMMUNITY): Payer: Self-pay | Admitting: Ophthalmology

## 2023-02-17 ENCOUNTER — Ambulatory Visit: Payer: Medicaid Other | Admitting: "Endocrinology

## 2023-02-17 DIAGNOSIS — N181 Chronic kidney disease, stage 1: Secondary | ICD-10-CM | POA: Diagnosis not present

## 2023-02-17 DIAGNOSIS — G6289 Other specified polyneuropathies: Secondary | ICD-10-CM | POA: Diagnosis not present

## 2023-02-17 DIAGNOSIS — M6281 Muscle weakness (generalized): Secondary | ICD-10-CM | POA: Diagnosis not present

## 2023-02-17 DIAGNOSIS — E11649 Type 2 diabetes mellitus with hypoglycemia without coma: Secondary | ICD-10-CM | POA: Diagnosis not present

## 2023-02-17 DIAGNOSIS — H8110 Benign paroxysmal vertigo, unspecified ear: Secondary | ICD-10-CM | POA: Diagnosis not present

## 2023-02-17 DIAGNOSIS — E1169 Type 2 diabetes mellitus with other specified complication: Secondary | ICD-10-CM | POA: Diagnosis not present

## 2023-02-17 DIAGNOSIS — G6281 Critical illness polyneuropathy: Secondary | ICD-10-CM | POA: Diagnosis not present

## 2023-02-17 DIAGNOSIS — Z48812 Encounter for surgical aftercare following surgery on the circulatory system: Secondary | ICD-10-CM | POA: Diagnosis not present

## 2023-02-17 DIAGNOSIS — N179 Acute kidney failure, unspecified: Secondary | ICD-10-CM | POA: Diagnosis not present

## 2023-02-17 DIAGNOSIS — F02B3 Dementia in other diseases classified elsewhere, moderate, with mood disturbance: Secondary | ICD-10-CM | POA: Diagnosis not present

## 2023-02-17 DIAGNOSIS — F319 Bipolar disorder, unspecified: Secondary | ICD-10-CM | POA: Diagnosis not present

## 2023-02-17 DIAGNOSIS — F02C0 Dementia in other diseases classified elsewhere, severe, without behavioral disturbance, psychotic disturbance, mood disturbance, and anxiety: Secondary | ICD-10-CM | POA: Diagnosis not present

## 2023-02-17 DIAGNOSIS — F3132 Bipolar disorder, current episode depressed, moderate: Secondary | ICD-10-CM | POA: Diagnosis not present

## 2023-02-17 DIAGNOSIS — F29 Unspecified psychosis not due to a substance or known physiological condition: Secondary | ICD-10-CM | POA: Diagnosis not present

## 2023-02-17 DIAGNOSIS — E1110 Type 2 diabetes mellitus with ketoacidosis without coma: Secondary | ICD-10-CM | POA: Diagnosis not present

## 2023-02-17 DIAGNOSIS — J45909 Unspecified asthma, uncomplicated: Secondary | ICD-10-CM | POA: Diagnosis not present

## 2023-02-17 DIAGNOSIS — R400 Somnolence: Secondary | ICD-10-CM | POA: Diagnosis not present

## 2023-02-17 DIAGNOSIS — R062 Wheezing: Secondary | ICD-10-CM | POA: Diagnosis not present

## 2023-02-17 DIAGNOSIS — Z982 Presence of cerebrospinal fluid drainage device: Secondary | ICD-10-CM | POA: Diagnosis not present

## 2023-02-17 DIAGNOSIS — M25531 Pain in right wrist: Secondary | ICD-10-CM | POA: Diagnosis not present

## 2023-02-17 DIAGNOSIS — G300 Alzheimer's disease with early onset: Secondary | ICD-10-CM | POA: Diagnosis not present

## 2023-02-17 DIAGNOSIS — E1165 Type 2 diabetes mellitus with hyperglycemia: Secondary | ICD-10-CM | POA: Diagnosis not present

## 2023-02-17 DIAGNOSIS — R0981 Nasal congestion: Secondary | ICD-10-CM | POA: Diagnosis not present

## 2023-02-17 DIAGNOSIS — R4182 Altered mental status, unspecified: Secondary | ICD-10-CM | POA: Diagnosis not present

## 2023-02-17 DIAGNOSIS — I10 Essential (primary) hypertension: Secondary | ICD-10-CM | POA: Diagnosis not present

## 2023-02-22 ENCOUNTER — Encounter (HOSPITAL_COMMUNITY)
Admission: RE | Admit: 2023-02-22 | Discharge: 2023-02-22 | Disposition: A | Discharge status: Home / Self Care | Payer: Medicaid Other | Source: Ambulatory Visit | Attending: Ophthalmology | Admitting: Ophthalmology

## 2023-02-22 DIAGNOSIS — H25811 Combined forms of age-related cataract, right eye: Secondary | ICD-10-CM | POA: Diagnosis not present

## 2023-02-22 NOTE — Pre-Procedure Instructions (Signed)
Attempted pre-op phone call. No Vm, could not leave message.

## 2023-02-23 DIAGNOSIS — Z79899 Other long term (current) drug therapy: Secondary | ICD-10-CM | POA: Diagnosis not present

## 2023-02-23 DIAGNOSIS — F419 Anxiety disorder, unspecified: Secondary | ICD-10-CM | POA: Diagnosis not present

## 2023-02-23 DIAGNOSIS — B0229 Other postherpetic nervous system involvement: Secondary | ICD-10-CM | POA: Diagnosis not present

## 2023-02-24 DIAGNOSIS — G6281 Critical illness polyneuropathy: Secondary | ICD-10-CM | POA: Diagnosis not present

## 2023-02-24 DIAGNOSIS — F29 Unspecified psychosis not due to a substance or known physiological condition: Secondary | ICD-10-CM | POA: Diagnosis not present

## 2023-02-24 DIAGNOSIS — R4182 Altered mental status, unspecified: Secondary | ICD-10-CM | POA: Diagnosis not present

## 2023-02-24 DIAGNOSIS — E1110 Type 2 diabetes mellitus with ketoacidosis without coma: Secondary | ICD-10-CM | POA: Diagnosis not present

## 2023-02-24 DIAGNOSIS — N1832 Chronic kidney disease, stage 3b: Secondary | ICD-10-CM | POA: Diagnosis not present

## 2023-02-24 DIAGNOSIS — F02B3 Dementia in other diseases classified elsewhere, moderate, with mood disturbance: Secondary | ICD-10-CM | POA: Diagnosis not present

## 2023-02-24 DIAGNOSIS — R400 Somnolence: Secondary | ICD-10-CM | POA: Diagnosis not present

## 2023-02-24 DIAGNOSIS — M25531 Pain in right wrist: Secondary | ICD-10-CM | POA: Diagnosis not present

## 2023-02-24 DIAGNOSIS — I10 Essential (primary) hypertension: Secondary | ICD-10-CM | POA: Diagnosis not present

## 2023-02-24 DIAGNOSIS — K219 Gastro-esophageal reflux disease without esophagitis: Secondary | ICD-10-CM | POA: Diagnosis not present

## 2023-02-24 DIAGNOSIS — F02C0 Dementia in other diseases classified elsewhere, severe, without behavioral disturbance, psychotic disturbance, mood disturbance, and anxiety: Secondary | ICD-10-CM | POA: Diagnosis not present

## 2023-02-24 DIAGNOSIS — N179 Acute kidney failure, unspecified: Secondary | ICD-10-CM | POA: Diagnosis not present

## 2023-02-24 DIAGNOSIS — F319 Bipolar disorder, unspecified: Secondary | ICD-10-CM | POA: Diagnosis not present

## 2023-02-24 DIAGNOSIS — N181 Chronic kidney disease, stage 1: Secondary | ICD-10-CM | POA: Diagnosis not present

## 2023-02-24 DIAGNOSIS — G912 (Idiopathic) normal pressure hydrocephalus: Secondary | ICD-10-CM | POA: Diagnosis not present

## 2023-02-24 DIAGNOSIS — G6289 Other specified polyneuropathies: Secondary | ICD-10-CM | POA: Diagnosis not present

## 2023-02-24 DIAGNOSIS — M6281 Muscle weakness (generalized): Secondary | ICD-10-CM | POA: Diagnosis not present

## 2023-02-24 DIAGNOSIS — J45909 Unspecified asthma, uncomplicated: Secondary | ICD-10-CM | POA: Diagnosis not present

## 2023-02-24 DIAGNOSIS — E785 Hyperlipidemia, unspecified: Secondary | ICD-10-CM | POA: Diagnosis not present

## 2023-02-24 DIAGNOSIS — H8110 Benign paroxysmal vertigo, unspecified ear: Secondary | ICD-10-CM | POA: Diagnosis not present

## 2023-02-24 DIAGNOSIS — E1169 Type 2 diabetes mellitus with other specified complication: Secondary | ICD-10-CM | POA: Diagnosis not present

## 2023-02-24 DIAGNOSIS — E1165 Type 2 diabetes mellitus with hyperglycemia: Secondary | ICD-10-CM | POA: Diagnosis not present

## 2023-02-24 DIAGNOSIS — G300 Alzheimer's disease with early onset: Secondary | ICD-10-CM | POA: Diagnosis not present

## 2023-02-24 DIAGNOSIS — J449 Chronic obstructive pulmonary disease, unspecified: Secondary | ICD-10-CM | POA: Diagnosis not present

## 2023-02-24 DIAGNOSIS — Z982 Presence of cerebrospinal fluid drainage device: Secondary | ICD-10-CM | POA: Diagnosis not present

## 2023-02-24 DIAGNOSIS — Z48812 Encounter for surgical aftercare following surgery on the circulatory system: Secondary | ICD-10-CM | POA: Diagnosis not present

## 2023-02-24 DIAGNOSIS — F419 Anxiety disorder, unspecified: Secondary | ICD-10-CM | POA: Diagnosis not present

## 2023-02-24 DIAGNOSIS — E11649 Type 2 diabetes mellitus with hypoglycemia without coma: Secondary | ICD-10-CM | POA: Diagnosis not present

## 2023-02-24 DIAGNOSIS — F3132 Bipolar disorder, current episode depressed, moderate: Secondary | ICD-10-CM | POA: Diagnosis not present

## 2023-02-24 DIAGNOSIS — R0981 Nasal congestion: Secondary | ICD-10-CM | POA: Diagnosis not present

## 2023-02-24 DIAGNOSIS — R062 Wheezing: Secondary | ICD-10-CM | POA: Diagnosis not present

## 2023-02-26 ENCOUNTER — Ambulatory Visit (HOSPITAL_BASED_OUTPATIENT_CLINIC_OR_DEPARTMENT_OTHER): Payer: Medicaid Other | Admitting: Anesthesiology

## 2023-02-26 ENCOUNTER — Encounter (HOSPITAL_COMMUNITY): Payer: Self-pay | Admitting: Ophthalmology

## 2023-02-26 ENCOUNTER — Ambulatory Visit (HOSPITAL_COMMUNITY)
Admission: RE | Admit: 2023-02-26 | Discharge: 2023-02-26 | Disposition: A | Discharge status: Skilled Nursing Facility | Payer: Medicaid Other | Source: Ambulatory Visit | Attending: Ophthalmology | Admitting: Ophthalmology

## 2023-02-26 ENCOUNTER — Ambulatory Visit (HOSPITAL_COMMUNITY): Payer: Medicaid Other | Admitting: Anesthesiology

## 2023-02-26 ENCOUNTER — Encounter (HOSPITAL_COMMUNITY)
Admission: RE | Disposition: A | Discharge status: Skilled Nursing Facility | Payer: Self-pay | Source: Ambulatory Visit | Attending: Ophthalmology

## 2023-02-26 DIAGNOSIS — J45909 Unspecified asthma, uncomplicated: Secondary | ICD-10-CM | POA: Diagnosis not present

## 2023-02-26 DIAGNOSIS — H25811 Combined forms of age-related cataract, right eye: Secondary | ICD-10-CM

## 2023-02-26 DIAGNOSIS — Z7984 Long term (current) use of oral hypoglycemic drugs: Secondary | ICD-10-CM | POA: Diagnosis not present

## 2023-02-26 DIAGNOSIS — K219 Gastro-esophageal reflux disease without esophagitis: Secondary | ICD-10-CM | POA: Diagnosis not present

## 2023-02-26 DIAGNOSIS — H0100A Unspecified blepharitis right eye, upper and lower eyelids: Secondary | ICD-10-CM | POA: Insufficient documentation

## 2023-02-26 DIAGNOSIS — I10 Essential (primary) hypertension: Secondary | ICD-10-CM | POA: Diagnosis not present

## 2023-02-26 DIAGNOSIS — H02831 Dermatochalasis of right upper eyelid: Secondary | ICD-10-CM | POA: Insufficient documentation

## 2023-02-26 DIAGNOSIS — F319 Bipolar disorder, unspecified: Secondary | ICD-10-CM | POA: Diagnosis not present

## 2023-02-26 DIAGNOSIS — H35372 Puckering of macula, left eye: Secondary | ICD-10-CM | POA: Insufficient documentation

## 2023-02-26 DIAGNOSIS — F17210 Nicotine dependence, cigarettes, uncomplicated: Secondary | ICD-10-CM | POA: Diagnosis not present

## 2023-02-26 DIAGNOSIS — H0100B Unspecified blepharitis left eye, upper and lower eyelids: Secondary | ICD-10-CM | POA: Diagnosis not present

## 2023-02-26 DIAGNOSIS — F419 Anxiety disorder, unspecified: Secondary | ICD-10-CM | POA: Diagnosis not present

## 2023-02-26 DIAGNOSIS — Z794 Long term (current) use of insulin: Secondary | ICD-10-CM | POA: Insufficient documentation

## 2023-02-26 DIAGNOSIS — H02834 Dermatochalasis of left upper eyelid: Secondary | ICD-10-CM | POA: Insufficient documentation

## 2023-02-26 DIAGNOSIS — Z79899 Other long term (current) drug therapy: Secondary | ICD-10-CM | POA: Diagnosis not present

## 2023-02-26 DIAGNOSIS — E1136 Type 2 diabetes mellitus with diabetic cataract: Secondary | ICD-10-CM | POA: Insufficient documentation

## 2023-02-26 DIAGNOSIS — H11153 Pinguecula, bilateral: Secondary | ICD-10-CM | POA: Diagnosis not present

## 2023-02-26 DIAGNOSIS — H52223 Regular astigmatism, bilateral: Secondary | ICD-10-CM | POA: Diagnosis not present

## 2023-02-26 HISTORY — PX: CATARACT EXTRACTION W/PHACO: SHX586

## 2023-02-26 LAB — GLUCOSE, CAPILLARY: Glucose-Capillary: 196 mg/dL — ABNORMAL HIGH (ref 70–99)

## 2023-02-26 SURGERY — PHACOEMULSIFICATION, CATARACT, WITH IOL INSERTION
Anesthesia: Monitor Anesthesia Care | Site: Eye | Laterality: Right

## 2023-02-26 MED ORDER — BSS IO SOLN
INTRAOCULAR | Status: DC | PRN
  Administered 2023-02-26: 15 mL via INTRAOCULAR

## 2023-02-26 MED ORDER — SODIUM HYALURONATE 23MG/ML IO SOSY
PREFILLED_SYRINGE | INTRAOCULAR | Status: DC | PRN
  Administered 2023-02-26: .6 mL via INTRAOCULAR

## 2023-02-26 MED ORDER — EPINEPHRINE PF 1 MG/ML IJ SOLN
INTRAOCULAR | Status: DC | PRN
  Administered 2023-02-26: 500 mL

## 2023-02-26 MED ORDER — POVIDONE-IODINE 5 % OP SOLN
OPHTHALMIC | Status: DC | PRN
  Administered 2023-02-26: 1 via OPHTHALMIC

## 2023-02-26 MED ORDER — SODIUM HYALURONATE 10 MG/ML IO SOLUTION
PREFILLED_SYRINGE | INTRAOCULAR | Status: DC | PRN
  Administered 2023-02-26: .85 mL via INTRAOCULAR

## 2023-02-26 MED ORDER — TETRACAINE HCL 0.5 % OP SOLN
1.0000 [drp] | OPHTHALMIC | Status: AC | PRN
  Administered 2023-02-26 (×3): 1 [drp] via OPHTHALMIC

## 2023-02-26 MED ORDER — PHENYLEPHRINE HCL 2.5 % OP SOLN
1.0000 [drp] | OPHTHALMIC | Status: AC | PRN
  Administered 2023-02-26 (×3): 1 [drp] via OPHTHALMIC

## 2023-02-26 MED ORDER — TROPICAMIDE 1 % OP SOLN
1.0000 [drp] | OPHTHALMIC | Status: AC | PRN
  Administered 2023-02-26 (×3): 1 [drp] via OPHTHALMIC

## 2023-02-26 MED ORDER — EPINEPHRINE PF 1 MG/ML IJ SOLN
INTRAMUSCULAR | Status: AC
  Filled 2023-02-26: qty 2

## 2023-02-26 MED ORDER — MIDAZOLAM HCL 2 MG/2ML IJ SOLN
INTRAMUSCULAR | Status: AC
  Filled 2023-02-26: qty 2

## 2023-02-26 MED ORDER — LACTATED RINGERS IV SOLN: INTRAVENOUS | Status: DC

## 2023-02-26 MED ORDER — STERILE WATER FOR IRRIGATION IR SOLN
Status: DC | PRN
  Administered 2023-02-26: 250 mL

## 2023-02-26 MED ORDER — LIDOCAINE HCL 3.5 % OP GEL
1.0000 | Freq: Once | OPHTHALMIC | Status: AC
  Administered 2023-02-26: 1 via OPHTHALMIC

## 2023-02-26 MED ORDER — SODIUM CHLORIDE 0.9% FLUSH
INTRAVENOUS | Status: DC | PRN
  Administered 2023-02-26: 5 mL via INTRAVENOUS

## 2023-02-26 MED ORDER — MIDAZOLAM HCL 2 MG/2ML IJ SOLN
INTRAMUSCULAR | Status: DC | PRN
  Administered 2023-02-26: 1 mg via INTRAVENOUS

## 2023-02-26 MED ORDER — LIDOCAINE HCL (PF) 1 % IJ SOLN
INTRAOCULAR | Status: DC | PRN
  Administered 2023-02-26: 1 mL via OPHTHALMIC

## 2023-02-26 SURGICAL SUPPLY — 13 items
CATARACT SUITE SIGHTPATH (MISCELLANEOUS) ×1 IMPLANT
CLOTH BEACON ORANGE TIMEOUT ST (SAFETY) ×2 IMPLANT
EYE SHIELD UNIVERSAL CLEAR (GAUZE/BANDAGES/DRESSINGS) IMPLANT
FEE CATARACT SUITE SIGHTPATH (MISCELLANEOUS) ×2 IMPLANT
GLOVE BIOGEL PI IND STRL 7.0 (GLOVE) ×4 IMPLANT
LENS IOL TECNIS EYHANCE 22.0 (Intraocular Lens) IMPLANT
NDL HYPO 18GX1.5 BLUNT FILL (NEEDLE) ×2 IMPLANT
NEEDLE HYPO 18GX1.5 BLUNT FILL (NEEDLE) ×1 IMPLANT
PAD ARMBOARD 7.5X6 YLW CONV (MISCELLANEOUS) ×2 IMPLANT
RING MALYGIN 7.0 (MISCELLANEOUS) IMPLANT
SYR TB 1ML LL NO SAFETY (SYRINGE) ×2 IMPLANT
TAPE SURG TRANSPORE 1 IN (GAUZE/BANDAGES/DRESSINGS) IMPLANT
WATER STERILE IRR 250ML POUR (IV SOLUTION) ×2 IMPLANT

## 2023-02-26 NOTE — Anesthesia Procedure Notes (Signed)
Procedure Name: MAC Date/Time: 02/26/2023 9:32 AM  Performed by: Julian Reil, CRNAPre-anesthesia Checklist: Patient identified, Emergency Drugs available, Suction available and Patient being monitored Patient Re-evaluated:Patient Re-evaluated prior to induction Oxygen Delivery Method: Nasal cannula Placement Confirmation: positive ETCO2

## 2023-02-26 NOTE — Transfer of Care (Signed)
Immediate Anesthesia Transfer of Care Note  Patient: Carolyn Norton  Procedure(s) Performed: CATARACT EXTRACTION PHACO AND INTRAOCULAR LENS PLACEMENT (IOC) (Right: Eye)  Patient Location: Short Stay  Anesthesia Type:MAC  Level of Consciousness: awake, alert , and oriented  Airway & Oxygen Therapy: Patient Spontanous Breathing  Post-op Assessment: Report given to RN and Post -op Vital signs reviewed and stable  Post vital signs: Reviewed and stable  Last Vitals:  Vitals Value Taken Time  BP    Temp    Pulse    Resp    SpO2      Last Pain:  Vitals:   02/26/23 0845  TempSrc: Oral  PainSc: 0-No pain         Complications: No notable events documented.

## 2023-02-26 NOTE — Op Note (Signed)
Date of procedure: 02/26/23  Pre-operative diagnosis:  Visually significant combined form age-related cataract, Right Eye (H25.811)  Post-operative diagnosis:  Visually significant combined form age-related cataract, Right Eye (H25.811)  Procedure: Removal of cataract via phacoemulsification and insertion of intra-ocular lens [employer] DIB00 +22.0D into the capsular bag of the Right Eye  Attending surgeon: Rudy Jew. Makyle Eslick, MD, MA  Anesthesia: MAC, Topical Akten  Complications: None  Estimated Blood Loss: <74mL (minimal)  Specimens: None  Implants: As above  Indications:  Visually significant age-related cataract, Right Eye  Procedure:  The patient was seen and identified in the pre-operative area. The operative eye was identified and dilated.  The operative eye was marked.  Topical anesthesia was administered to the operative eye.     The patient was then to the operative suite and placed in the supine position.  A timeout was performed confirming the patient, procedure to be performed, and all other relevant information.   The patient's face was prepped and draped in the usual fashion for intra-ocular surgery.  A lid speculum was placed into the operative eye and the surgical microscope moved into place and focused.  A superotemporal paracentesis was created using a 20 gauge paracentesis blade.  Shugarcaine was injected into the anterior chamber.  Viscoelastic was injected into the anterior chamber.  A temporal clear-corneal main wound incision was created using a 2.49mm microkeratome.  A continuous curvilinear capsulorrhexis was initiated using an irrigating cystitome and completed using capsulorrhexis forceps.  Hydrodissection and hydrodeliniation were performed.  Viscoelastic was injected into the anterior chamber.  A phacoemulsification handpiece and a chopper as a second instrument were used to remove the nucleus and epinucleus. The irrigation/aspiration handpiece was used to  remove any remaining cortical material.   The capsular bag was reinflated with viscoelastic, checked, and found to be intact.  The intraocular lens was inserted into the capsular bag.  The irrigation/aspiration handpiece was used to remove any remaining viscoelastic.  The clear corneal wound and paracentesis wounds were then hydrated and checked with Weck-Cels to be watertight. The lid-speculum was removed.  The drape was removed.  The patient's face was cleaned with a wet and dry 4x4. A clear shield was taped over the eye. The patient was taken to the post-operative care unit in good condition, having tolerated the procedure well.  Post-Op Instructions: The patient will follow up at [facility] for a same day post-operative evaluation and will receive all other orders and instructions.

## 2023-02-26 NOTE — Interval H&P Note (Signed)
History and Physical Interval Note:  02/26/2023 9:28 AM  Carolyn Norton  has presented today for surgery, with the diagnosis of combined froms age related cataract, right eye.  The various methods of treatment have been discussed with the patient and family. After consideration of risks, benefits and other options for treatment, the patient has consented to  Procedure(s) with comments: CATARACT EXTRACTION PHACO AND INTRAOCULAR LENS PLACEMENT (IOC) (Right) - CDE: as a surgical intervention.  The patient's history has been reviewed, patient examined, no change in status, stable for surgery.  I have reviewed the patient's chart and labs.  Questions were answered to the patient's satisfaction.     Fabio Pierce

## 2023-02-26 NOTE — Anesthesia Preprocedure Evaluation (Signed)
Anesthesia Evaluation  Patient identified by MRN, date of birth, ID band Patient awake    Reviewed: Allergy & Precautions, H&P , NPO status , Patient's Chart, lab work & pertinent test results  Airway Mallampati: II  TM Distance: >3 FB Neck ROM: Full    Dental  (+) Edentulous Upper, Edentulous Lower   Pulmonary asthma , Current Smoker and Patient abstained from smoking.   Pulmonary exam normal breath sounds clear to auscultation       Cardiovascular hypertension, Pt. on medications Normal cardiovascular exam Rhythm:Regular Rate:Normal     Neuro/Psych  PSYCHIATRIC DISORDERS Anxiety Depression Bipolar Disorder  Dementia negative neurological ROS     GI/Hepatic Neg liver ROS,GERD  Medicated,,  Endo/Other  diabetes, Well Controlled, Type 2, Oral Hypoglycemic Agents, Insulin Dependent    Renal/GU Renal disease  negative genitourinary   Musculoskeletal negative musculoskeletal ROS (+)    Abdominal   Peds negative pediatric ROS (+)  Hematology negative hematology ROS (+)   Anesthesia Other Findings   Reproductive/Obstetrics negative OB ROS                             Anesthesia Physical Anesthesia Plan  ASA: 3  Anesthesia Plan: MAC   Post-op Pain Management: Minimal or no pain anticipated   Induction: Intravenous  PONV Risk Score and Plan: 0 and Treatment may vary due to age or medical condition  Airway Management Planned: Natural Airway and Nasal Cannula  Additional Equipment:   Intra-op Plan:   Post-operative Plan:   Informed Consent: I have reviewed the patients History and Physical, chart, labs and discussed the procedure including the risks, benefits and alternatives for the proposed anesthesia with the patient or authorized representative who has indicated his/her understanding and acceptance.     Dental advisory given  Plan Discussed with: CRNA and Surgeon  Anesthesia  Plan Comments:        Anesthesia Quick Evaluation

## 2023-02-26 NOTE — Anesthesia Postprocedure Evaluation (Signed)
Anesthesia Post Note  Patient: Carolyn Norton  Procedure(s) Performed: CATARACT EXTRACTION PHACO AND INTRAOCULAR LENS PLACEMENT (IOC) (Right: Eye)  Patient location during evaluation: Phase II Anesthesia Type: MAC Level of consciousness: awake and alert and oriented Pain management: pain level controlled Vital Signs Assessment: post-procedure vital signs reviewed and stable Respiratory status: spontaneous breathing, nonlabored ventilation and respiratory function stable Cardiovascular status: blood pressure returned to baseline and stable Postop Assessment: no apparent nausea or vomiting Anesthetic complications: no  No notable events documented.   Last Vitals:  Vitals:   02/26/23 0845 02/26/23 0953  BP: 97/71 106/81  Pulse: 77 80  Resp: 13 16  Temp: 36.6 C 36.5 C  SpO2: 97% 98%    Last Pain:  Vitals:   02/26/23 0953  TempSrc: Oral  PainSc: 0-No pain                 Raegan Sipp C Yuvin Bussiere

## 2023-02-26 NOTE — Discharge Instructions (Signed)
Please discharge patient when stable, will follow up today with Dr. Havannah Streat at the [facility] [place] office immediately following discharge.  Leave shield in place until visit.  All paperwork with discharge instructions will be given at the office.  [facility] [place] Address:  [address]  [place], [place] [address]  

## 2023-03-01 DIAGNOSIS — N179 Acute kidney failure, unspecified: Secondary | ICD-10-CM | POA: Diagnosis not present

## 2023-03-01 DIAGNOSIS — E785 Hyperlipidemia, unspecified: Secondary | ICD-10-CM | POA: Diagnosis not present

## 2023-03-01 DIAGNOSIS — I10 Essential (primary) hypertension: Secondary | ICD-10-CM | POA: Diagnosis not present

## 2023-03-01 DIAGNOSIS — E1165 Type 2 diabetes mellitus with hyperglycemia: Secondary | ICD-10-CM | POA: Diagnosis not present

## 2023-03-01 DIAGNOSIS — I739 Peripheral vascular disease, unspecified: Secondary | ICD-10-CM | POA: Diagnosis not present

## 2023-03-01 DIAGNOSIS — Z1321 Encounter for screening for nutritional disorder: Secondary | ICD-10-CM | POA: Diagnosis not present

## 2023-03-02 ENCOUNTER — Encounter (HOSPITAL_COMMUNITY): Payer: Self-pay | Admitting: Ophthalmology

## 2023-03-03 DIAGNOSIS — R4182 Altered mental status, unspecified: Secondary | ICD-10-CM | POA: Diagnosis not present

## 2023-03-03 DIAGNOSIS — G6289 Other specified polyneuropathies: Secondary | ICD-10-CM | POA: Diagnosis not present

## 2023-03-03 DIAGNOSIS — M6281 Muscle weakness (generalized): Secondary | ICD-10-CM | POA: Diagnosis not present

## 2023-03-03 DIAGNOSIS — E1165 Type 2 diabetes mellitus with hyperglycemia: Secondary | ICD-10-CM | POA: Diagnosis not present

## 2023-03-03 DIAGNOSIS — F02B3 Dementia in other diseases classified elsewhere, moderate, with mood disturbance: Secondary | ICD-10-CM | POA: Diagnosis not present

## 2023-03-03 DIAGNOSIS — N181 Chronic kidney disease, stage 1: Secondary | ICD-10-CM | POA: Diagnosis not present

## 2023-03-03 DIAGNOSIS — F02C0 Dementia in other diseases classified elsewhere, severe, without behavioral disturbance, psychotic disturbance, mood disturbance, and anxiety: Secondary | ICD-10-CM | POA: Diagnosis not present

## 2023-03-03 DIAGNOSIS — F319 Bipolar disorder, unspecified: Secondary | ICD-10-CM | POA: Diagnosis not present

## 2023-03-03 DIAGNOSIS — F29 Unspecified psychosis not due to a substance or known physiological condition: Secondary | ICD-10-CM | POA: Diagnosis not present

## 2023-03-03 DIAGNOSIS — E1169 Type 2 diabetes mellitus with other specified complication: Secondary | ICD-10-CM | POA: Diagnosis not present

## 2023-03-03 DIAGNOSIS — G300 Alzheimer's disease with early onset: Secondary | ICD-10-CM | POA: Diagnosis not present

## 2023-03-03 DIAGNOSIS — G6281 Critical illness polyneuropathy: Secondary | ICD-10-CM | POA: Diagnosis not present

## 2023-03-03 DIAGNOSIS — E11649 Type 2 diabetes mellitus with hypoglycemia without coma: Secondary | ICD-10-CM | POA: Diagnosis not present

## 2023-03-03 DIAGNOSIS — M25531 Pain in right wrist: Secondary | ICD-10-CM | POA: Diagnosis not present

## 2023-03-03 DIAGNOSIS — R400 Somnolence: Secondary | ICD-10-CM | POA: Diagnosis not present

## 2023-03-03 DIAGNOSIS — R0981 Nasal congestion: Secondary | ICD-10-CM | POA: Diagnosis not present

## 2023-03-03 DIAGNOSIS — F3132 Bipolar disorder, current episode depressed, moderate: Secondary | ICD-10-CM | POA: Diagnosis not present

## 2023-03-03 DIAGNOSIS — N179 Acute kidney failure, unspecified: Secondary | ICD-10-CM | POA: Diagnosis not present

## 2023-03-03 DIAGNOSIS — E1110 Type 2 diabetes mellitus with ketoacidosis without coma: Secondary | ICD-10-CM | POA: Diagnosis not present

## 2023-03-03 DIAGNOSIS — H8110 Benign paroxysmal vertigo, unspecified ear: Secondary | ICD-10-CM | POA: Diagnosis not present

## 2023-03-03 DIAGNOSIS — R062 Wheezing: Secondary | ICD-10-CM | POA: Diagnosis not present

## 2023-03-03 DIAGNOSIS — I10 Essential (primary) hypertension: Secondary | ICD-10-CM | POA: Diagnosis not present

## 2023-03-03 DIAGNOSIS — Z982 Presence of cerebrospinal fluid drainage device: Secondary | ICD-10-CM | POA: Diagnosis not present

## 2023-03-03 DIAGNOSIS — J45909 Unspecified asthma, uncomplicated: Secondary | ICD-10-CM | POA: Diagnosis not present

## 2023-03-03 DIAGNOSIS — Z48812 Encounter for surgical aftercare following surgery on the circulatory system: Secondary | ICD-10-CM | POA: Diagnosis not present

## 2023-03-04 DIAGNOSIS — E1165 Type 2 diabetes mellitus with hyperglycemia: Secondary | ICD-10-CM | POA: Diagnosis not present

## 2023-03-04 DIAGNOSIS — K219 Gastro-esophageal reflux disease without esophagitis: Secondary | ICD-10-CM | POA: Diagnosis not present

## 2023-03-04 DIAGNOSIS — J45909 Unspecified asthma, uncomplicated: Secondary | ICD-10-CM | POA: Diagnosis not present

## 2023-03-04 DIAGNOSIS — I10 Essential (primary) hypertension: Secondary | ICD-10-CM | POA: Diagnosis not present

## 2023-03-04 DIAGNOSIS — E875 Hyperkalemia: Secondary | ICD-10-CM | POA: Diagnosis not present

## 2023-03-04 DIAGNOSIS — E559 Vitamin D deficiency, unspecified: Secondary | ICD-10-CM | POA: Diagnosis not present

## 2023-03-04 DIAGNOSIS — J449 Chronic obstructive pulmonary disease, unspecified: Secondary | ICD-10-CM | POA: Diagnosis not present

## 2023-03-04 DIAGNOSIS — Z79899 Other long term (current) drug therapy: Secondary | ICD-10-CM | POA: Diagnosis not present

## 2023-03-04 DIAGNOSIS — F419 Anxiety disorder, unspecified: Secondary | ICD-10-CM | POA: Diagnosis not present

## 2023-03-04 DIAGNOSIS — E785 Hyperlipidemia, unspecified: Secondary | ICD-10-CM | POA: Diagnosis not present

## 2023-03-04 DIAGNOSIS — F319 Bipolar disorder, unspecified: Secondary | ICD-10-CM | POA: Diagnosis not present

## 2023-05-03 DIAGNOSIS — J4540 Moderate persistent asthma, uncomplicated: Secondary | ICD-10-CM | POA: Diagnosis not present

## 2023-05-03 DIAGNOSIS — Z720 Tobacco use: Secondary | ICD-10-CM | POA: Diagnosis not present

## 2023-05-03 DIAGNOSIS — F319 Bipolar disorder, unspecified: Secondary | ICD-10-CM | POA: Diagnosis not present

## 2023-05-05 ENCOUNTER — Ambulatory Visit (INDEPENDENT_AMBULATORY_CARE_PROVIDER_SITE_OTHER): Payer: Medicaid Other | Admitting: Primary Care

## 2023-05-12 ENCOUNTER — Ambulatory Visit: Payer: Medicaid Other | Admitting: "Endocrinology

## 2023-06-08 DIAGNOSIS — K219 Gastro-esophageal reflux disease without esophagitis: Secondary | ICD-10-CM | POA: Diagnosis not present

## 2023-06-08 DIAGNOSIS — Z91199 Patient's noncompliance with other medical treatment and regimen due to unspecified reason: Secondary | ICD-10-CM | POA: Diagnosis not present

## 2023-06-08 DIAGNOSIS — R5383 Other fatigue: Secondary | ICD-10-CM | POA: Diagnosis not present

## 2023-06-08 DIAGNOSIS — E7800 Pure hypercholesterolemia, unspecified: Secondary | ICD-10-CM | POA: Diagnosis not present

## 2023-06-08 DIAGNOSIS — E1165 Type 2 diabetes mellitus with hyperglycemia: Secondary | ICD-10-CM | POA: Diagnosis not present

## 2023-06-08 DIAGNOSIS — I10 Essential (primary) hypertension: Secondary | ICD-10-CM | POA: Diagnosis not present

## 2023-06-14 DIAGNOSIS — E875 Hyperkalemia: Secondary | ICD-10-CM | POA: Diagnosis not present

## 2023-07-26 ENCOUNTER — Ambulatory Visit (INDEPENDENT_AMBULATORY_CARE_PROVIDER_SITE_OTHER): Payer: Medicare Other | Admitting: Podiatry

## 2023-07-26 ENCOUNTER — Encounter: Payer: Self-pay | Admitting: Podiatry

## 2023-07-26 DIAGNOSIS — E785 Hyperlipidemia, unspecified: Secondary | ICD-10-CM | POA: Diagnosis not present

## 2023-07-26 DIAGNOSIS — E1169 Type 2 diabetes mellitus with other specified complication: Secondary | ICD-10-CM

## 2023-07-26 DIAGNOSIS — B351 Tinea unguium: Secondary | ICD-10-CM

## 2023-07-27 NOTE — Progress Notes (Signed)
  Subjective:  Patient ID: Carolyn Norton, female    DOB: 1966/08/08,  MRN: [id number]  Chief Complaint  Patient presents with   Nail Problem    RM#21 Left foot toe nail fungus since 02/2023     Discussed the use of AI scribe software for clinical note transcription with the patient, who gave verbal consent to proceed.  History of Present Illness   The patient presents with a thick, dystrophic, and lamellar appearing nail with mycosis and retronychia on the left hallux. The nail fungus has been present since June. The patient denies any trauma to the nail and has not been on any antifungal medication. She was in a nursing home for about two years. The patient also has a history of diabetes, but the status of her hemoglobin A1c is unknown as she has not received the results from her recent check.          Objective:    Physical Exam   EXTREMITIES: Feet warm and well perfused, sensation intact. SKIN: Thick dystrophic lamellar appearing nail with mycosis and onycholysis observed.       No images are attached to the encounter.    Results   Procedure: Debridement of left hallux nail Description: The left hallux nail was debrided with a sharp nail nipper to remove the overlying new nail plate. Additional fungal bulk and old nail were removed with a rotary burr.      Assessment:   1. Onychomycosis   2. Type 2 diabetes mellitus with hyperlipidemia (HCC)      Plan:  Patient was evaluated and treated and all questions answered.  Assessment and Plan    Onychomycosis of the left hallux   The left hallux presents with a thick and dystrophic lamellar appearing nail, indicative of mycosis and retromycia, though new nail growth is observed. The nail was debrided to remove fungal bulk. We will continue to monitor for 6 months, considering toenail removal if there is no improvement or worsening.  Diabetes   She reports good control over her diabetes; however, recent A1C results are  unknown. We advise her to follow up on her recent A1C results.          Return if symptoms worsen or fail to improve.

## 2023-08-23 ENCOUNTER — Other Ambulatory Visit: Payer: Self-pay | Admitting: Obstetrics and Gynecology

## 2023-08-23 DIAGNOSIS — N6310 Unspecified lump in the right breast, unspecified quadrant: Secondary | ICD-10-CM

## 2023-08-27 ENCOUNTER — Telehealth: Payer: Self-pay | Admitting: *Deleted

## 2023-08-27 NOTE — Telephone Encounter (Signed)
Spoke with Carolyn Norton regarding her referral to GYN oncology. She has an appointment scheduled with Dr. Alvester Morin on 12/30 at 1030. Patient agrees to date and time. She has been provided with office address and location. She is also aware of our mask and visitor policy. Patient verbalized understanding and will call with any questions.

## 2023-08-27 NOTE — Telephone Encounter (Signed)
LMOM for the patient to call the office back. Patient needs to be scheduled for a new patient appt on 12/30

## 2023-09-03 ENCOUNTER — Encounter: Payer: Self-pay | Admitting: Psychiatry

## 2023-09-06 ENCOUNTER — Inpatient Hospital Stay: Payer: Medicare Other

## 2023-09-06 ENCOUNTER — Inpatient Hospital Stay (HOSPITAL_BASED_OUTPATIENT_CLINIC_OR_DEPARTMENT_OTHER): Payer: Medicare Other | Admitting: Gynecologic Oncology

## 2023-09-06 ENCOUNTER — Encounter: Payer: Self-pay | Admitting: Psychiatry

## 2023-09-06 ENCOUNTER — Inpatient Hospital Stay: Payer: Medicare Other | Attending: Psychiatry | Admitting: Psychiatry

## 2023-09-06 VITALS — BP 118/67 | HR 74 | Temp 98.0°F | Resp 19 | Ht 64.0 in | Wt 183.4 lb

## 2023-09-06 DIAGNOSIS — G912 (Idiopathic) normal pressure hydrocephalus: Secondary | ICD-10-CM

## 2023-09-06 DIAGNOSIS — C541 Malignant neoplasm of endometrium: Secondary | ICD-10-CM

## 2023-09-06 DIAGNOSIS — E785 Hyperlipidemia, unspecified: Secondary | ICD-10-CM | POA: Insufficient documentation

## 2023-09-06 DIAGNOSIS — E1169 Type 2 diabetes mellitus with other specified complication: Secondary | ICD-10-CM | POA: Diagnosis not present

## 2023-09-06 DIAGNOSIS — I10 Essential (primary) hypertension: Secondary | ICD-10-CM | POA: Diagnosis not present

## 2023-09-06 DIAGNOSIS — R4189 Other symptoms and signs involving cognitive functions and awareness: Secondary | ICD-10-CM

## 2023-09-06 LAB — CMP (CANCER CENTER ONLY)
ALT: 10 U/L (ref 0–44)
AST: 9 U/L — ABNORMAL LOW (ref 15–41)
Albumin: 4.1 g/dL (ref 3.5–5.0)
Alkaline Phosphatase: 98 U/L (ref 38–126)
Anion gap: 8 (ref 5–15)
BUN: 16 mg/dL (ref 6–20)
CO2: 32 mmol/L (ref 22–32)
Calcium: 10 mg/dL (ref 8.9–10.3)
Chloride: 96 mmol/L — ABNORMAL LOW (ref 98–111)
Creatinine: 1.5 mg/dL — ABNORMAL HIGH (ref 0.44–1.00)
GFR, Estimated: 41 mL/min — ABNORMAL LOW (ref >60)
Glucose, Bld: 414 mg/dL — ABNORMAL HIGH (ref 70–99)
Potassium: 4.8 mmol/L (ref 3.5–5.1)
Sodium: 136 mmol/L (ref 135–145)
Total Bilirubin: 0.3 mg/dL (ref 0.0–1.2)
Total Protein: 7.8 g/dL (ref 6.5–8.1)

## 2023-09-06 LAB — CBC (CANCER CENTER ONLY)
HCT: 44 % (ref 36.0–46.0)
Hemoglobin: 14.7 g/dL (ref 12.0–15.0)
MCH: 28.2 pg (ref 26.0–34.0)
MCHC: 33.4 g/dL (ref 30.0–36.0)
MCV: 84.5 fL (ref 80.0–100.0)
Platelet Count: 276 10*3/uL (ref 150–400)
RBC: 5.21 MIL/uL — ABNORMAL HIGH (ref 3.87–5.11)
RDW: 13.3 % (ref 11.5–15.5)
WBC Count: 10.1 10*3/uL (ref 4.0–10.5)
nRBC: 0 % (ref 0.0–0.2)

## 2023-09-06 NOTE — Progress Notes (Signed)
Patient here for a pre-operative appointment prior to her scheduled surgery on 10/05/2023. She is scheduled for a examination under anesthesia, dilation and curettage of the uterus, intrauterine device placement.  The surgery was discussed in detail.  See after visit summary for additional details.       Discussed post-op pain management in detail including the aspects of the enhanced recovery pathway.  We discussed the use of tylenol post-op and to monitor for a maximum of 4,000 mg in a 24 hour period.  Also prescribed sennakot to be used after surgery and to hold if having loose stools.  Discussed bowel regimen in detail.     Discussed the use of SCDs and measures to take at home to prevent DVT including frequent mobility.  Reportable signs and symptoms of DVT discussed. Post-operative instructions discussed and expectations for after surgery. Incisional care discussed as well including reportable signs and symptoms including erythema, drainage, wound separation.     30 minutes spent with the patient.  Verbalizing understanding of material discussed. No needs or concerns voiced at the end of the visit.   Advised patient to call for any needs.  Advised that her post-operative medications had been prescribed and could be picked up at any time.    This appointment is included in the global surgical bundle as pre-operative teaching and has no charge.

## 2023-09-06 NOTE — Patient Instructions (Addendum)
Plan on having a MRI of the pelvis before surgery.  We will reach out to your neurosurgeon to see if you could be a candidate for trendelenburg positioning in the future if hysterectomy recommended.  Preparing for your Surgery  Plan for surgery on 10/05/2023 with Dr. Clide Cliff at [facility]. You will be scheduled for examination under anesthesia, dilation and curettage of the uterus, intrauterine device placement.   Pre-operative Testing -You will receive a phone call from presurgical testing at [facility] to discuss surgery instructions and arrange for lab work if needed.  -Bring your insurance card, copy of an advanced directive if applicable, medication list.  -You should not be taking blood thinners or aspirin at least ten days prior to surgery unless instructed by your surgeon.  -Do not take supplements such as fish oil (omega 3), red yeast rice, turmeric before your surgery. You want to avoid medications with aspirin in them including headache powders such as BC or Goody's), Excedrin migraine.  Day Before Surgery at Home -You will be advised you can have clear liquids up until 3 hours before your surgery.    Your role in recovery Your role is to become active as soon as directed by your doctor, while still giving yourself time to heal.  Rest when you feel tired. You will be asked to do the following in order to speed your recovery:  - Cough and breathe deeply. This helps to clear and expand your lungs and can prevent pneumonia after surgery.  - STAY ACTIVE WHEN YOU GET HOME. Do mild physical activity. Walking or moving your legs help your circulation and body functions return to normal. Do not try to get up or walk alone the first time after surgery.   -If you develop swelling on one leg or the other, pain in the back of your leg, redness/warmth in one of your legs, please call the office or go to the Emergency Room to have a doppler to rule out a blood clot.  For shortness of breath, chest pain-seek care in the Emergency Room as soon as possible. - Actively manage your pain. Managing your pain lets you move in comfort. We will ask you to rate your pain on a scale of zero to 10. It is your responsibility to tell your doctor or nurse where and how much you hurt so your pain can be treated.  Special Considerations -Your final pathology results from surgery should be available around one week after surgery and the results will be relayed to you when available.  -FMLA forms can be faxed to [phone] and please allow 5-7 business days for completion.  Risks of Surgery Risks of surgery are low but include bleeding, infection, damage to surrounding structures, re-operation, blood clots, and very rarely death.  AFTER SURGERY INSTRUCTIONS  Return to work:  1-2 days if applicable  Activity: 1. Be up and out of the bed during the day.  Take a nap if needed.  You may walk up steps but be careful and use the hand rail.  Stair climbing will tire you more than you think, you may need to stop part way and rest.   2. No lifting or straining for 1 week over 10 pounds. No pushing, pulling, straining for 1 week.  3. No driving for minimum 24 hours after surgery.  Do not drive if you are taking narcotic pain medicine and make sure that your reaction time has returned.   4. You can shower  as soon as the next day after surgery. Shower daily. No tub baths or submerging your body in water until cleared by your surgeon for 2 weeks.  5. No sexual activity and nothing in the vagina for 2 weeks.  6. You may experience vaginal spotting and discharge after surgery.  The spotting is normal but if you experience heavy bleeding, call our office.  Diet: 1. Low sodium Heart Healthy Diet is recommended but you are cleared to resume your normal (before surgery) diet after your procedure.  2. It is safe to use a laxative, such as Miralax or Colace, if you have difficulty  moving your bowels.   Wound Care: 1. Keep clean and dry.  Shower daily.  Reasons to call the Doctor: Fever - Oral temperature greater than 100.4 degrees Fahrenheit Foul-smelling vaginal discharge Difficulty urinating Nausea and vomiting Increased pain at the site of the incision that is unrelieved with pain medicine. Difficulty breathing with or without chest pain New calf pain especially if only on one side Sudden, continuing increased vaginal bleeding with or without clots.   Contacts: For questions or concerns you should contact:  Dr. Clide Cliff at [phone]  Warner Mccreedy, NP at [phone]  After Hours: call [phone] and have the GYN Oncologist paged/contacted (after 5 pm or on the weekends).  Messages sent via mychart are for non-urgent matters and are not responded to after hours so for urgent needs, please call the after hours number.

## 2023-09-07 ENCOUNTER — Encounter: Payer: Self-pay | Admitting: Psychiatry

## 2023-09-07 LAB — HEMOGLOBIN A1C
Hgb A1c MFr Bld: 12.3 % — ABNORMAL HIGH (ref 4.8–5.6)
Mean Plasma Glucose: 306 mg/dL

## 2023-09-10 ENCOUNTER — Ambulatory Visit (HOSPITAL_COMMUNITY)
Admission: RE | Admit: 2023-09-10 | Discharge: 2023-09-10 | Disposition: A | Discharge status: Home / Self Care | Payer: Medicare Other | Source: Ambulatory Visit | Attending: Psychiatry | Admitting: Psychiatry

## 2023-09-10 ENCOUNTER — Encounter (HOSPITAL_COMMUNITY): Payer: Self-pay

## 2023-09-10 DIAGNOSIS — C541 Malignant neoplasm of endometrium: Secondary | ICD-10-CM

## 2023-09-13 ENCOUNTER — Telehealth: Payer: Self-pay | Admitting: *Deleted

## 2023-09-13 NOTE — Telephone Encounter (Signed)
 Per Dr Alvester Morin fax records and surgical optimization form to neurology (Dr Teresa Coombs [phone])

## 2023-09-13 NOTE — Telephone Encounter (Signed)
 Spoke with Ms. Vidaurri who states she hasn't reached out to neurology for a follow up as of yet. Pt's father Jasamine Pottinger came to the phone and states she hasn't seen neurologist since 2022. Mr. Molner was advised according to his daughter's medical record they saw her in Sept. 2023. Pt's father is aware that she needs to have the shunt adjusted before and after the MRI and he has the number to reschedule the MRI.  Mr. Deshaies was given Dr. Arley Flaming office number  [phone]  and address at [address] [place], [place] [address]. [phone]. With Neurology to schedule pt a follow up prior to her MRI and scheduled surgery. Mr. Hole verbalized understanding and states he will call the office back once [place] has an appointment.

## 2023-09-13 NOTE — Telephone Encounter (Signed)
 LMOM for the patient's aunt to call the office back. Need to check on the follow up appt with neurology for the MRI recommendations

## 2023-09-14 ENCOUNTER — Ambulatory Visit
Admission: RE | Admit: 2023-09-14 | Discharge: 2023-09-14 | Disposition: A | Discharge status: Home / Self Care | Payer: Medicaid Other | Source: Ambulatory Visit | Attending: Obstetrics and Gynecology

## 2023-09-14 ENCOUNTER — Ambulatory Visit
Admission: RE | Admit: 2023-09-14 | Discharge: 2023-09-14 | Disposition: A | Discharge status: Home / Self Care | Payer: Medicare Other | Source: Ambulatory Visit | Attending: Obstetrics and Gynecology

## 2023-09-14 DIAGNOSIS — N6310 Unspecified lump in the right breast, unspecified quadrant: Secondary | ICD-10-CM

## 2023-09-14 NOTE — Telephone Encounter (Signed)
 Canceled clearance to Dr Teresa Coombs and fax to Dr Wynetta Emery office

## 2023-09-14 NOTE — Telephone Encounter (Signed)
 Spoke with Jenkins Sprang Patient's Aunt yesterday who called the office in regards to follow up with Neurology.  Advised Ms. Brooks that [place] needs an appointment with Dr. Eliott office for Shunt placement follow up before MRI and follow up and  clearance for surgery with Dr. Eldonna. Information was given to [name] to contact Dr. Eliott office to schedule an appointment. Ms. Sprang states she has Dr. Eliott contact information and she will call his office. Medical Clearance faxed to [facility] on 09/13/23.

## 2023-09-15 ENCOUNTER — Other Ambulatory Visit: Payer: Self-pay | Admitting: Neurosurgery

## 2023-09-15 DIAGNOSIS — G912 (Idiopathic) normal pressure hydrocephalus: Secondary | ICD-10-CM

## 2023-09-15 NOTE — Telephone Encounter (Signed)
 Received clearance

## 2023-09-20 ENCOUNTER — Ambulatory Visit (HOSPITAL_COMMUNITY)
Admission: RE | Admit: 2023-09-20 | Discharge: 2023-09-20 | Disposition: A | Discharge status: Home / Self Care | Payer: Medicare Other | Source: Ambulatory Visit | Attending: Psychiatry | Admitting: Psychiatry

## 2023-09-20 DIAGNOSIS — C541 Malignant neoplasm of endometrium: Secondary | ICD-10-CM | POA: Diagnosis present

## 2023-09-20 MED ORDER — GADOBUTROL 1 MMOL/ML IV SOLN
7.0000 mL | Freq: Once | INTRAVENOUS | Status: AC | PRN
  Administered 2023-09-20: 7 mL via INTRAVENOUS

## 2023-09-21 NOTE — Patient Instructions (Signed)
 DUE TO COVID-19 ONLY TWO VISITORS  (aged 57 and older)  ARE ALLOWED TO COME WITH YOU AND STAY IN THE WAITING ROOM ONLY DURING PRE OP AND PROCEDURE.   **NO VISITORS ARE ALLOWED IN THE SHORT STAY AREA OR RECOVERY ROOM!!**  IF YOU WILL BE ADMITTED INTO THE HOSPITAL YOU ARE ALLOWED ONLY FOUR SUPPORT PEOPLE DURING VISITATION HOURS ONLY (7 AM -8PM)   The support person(s) must pass our screening, gel in and out, and wear a mask at all times, including in the patient's room. Patients must also wear a mask when staff or their support person are in the room. Visitors GUEST BADGE MUST BE WORN VISIBLY  One adult visitor may remain with you overnight and MUST be in the room by 8 P.M.     Your procedure is scheduled on: 10/05/23   Report to [facility] Main Entrance    Report to admitting at : 5:15 AM   Call this number if you have problems the morning of surgery [phone]   Do not eat food or drink: After Midnight.  FOLLOW ANY ADDITIONAL PRE OP INSTRUCTIONS YOU RECEIVED FROM YOUR SURGEON'S OFFICE!!!   Oral Hygiene is also important to reduce your risk of infection.                                    Remember - BRUSH YOUR TEETH THE MORNING OF SURGERY WITH YOUR REGULAR TOOTHPASTE  DENTURES WILL BE REMOVED PRIOR TO SURGERY PLEASE DO NOT APPLY "Poly grip" OR ADHESIVES!!!   Do NOT smoke after Midnight   Take these medicines the morning of surgery with A SIP OF WATER : Depakote ,sertraline,metoprolol ,amlodipine ,famotidine .  How to Manage Your Diabetes Before and After Surgery  Why is it important to control my blood sugar before and after surgery? Improving blood sugar levels before and after surgery helps healing and can limit problems. A way of improving blood sugar control is eating a healthy diet by:  Eating less sugar and carbohydrates  Increasing activity/exercise  Talking with your doctor about reaching your blood sugar goals High blood sugars (greater than 180 mg/dL) can raise  your risk of infections and slow your recovery, so you will need to focus on controlling your diabetes during the weeks before surgery. Make sure that the doctor who takes care of your diabetes knows about your planned surgery including the date and location.  How do I manage my blood sugar before surgery? Check your blood sugar at least 4 times a day, starting 2 days before surgery, to make sure that the level is not too high or low. Check your blood sugar the morning of your surgery when you wake up and every 2 hours until you get to the Short Stay unit. If your blood sugar is less than 70 mg/dL, you will need to treat for low blood sugar: Do not take insulin . Treat a low blood sugar (less than 70 mg/dL) with  cup of clear juice (cranberry or apple), 4 glucose tablets, OR glucose gel. Recheck blood sugar in 15 minutes after treatment (to make sure it is greater than 70 mg/dL). If your blood sugar is not greater than 70 mg/dL on recheck, call [phone] for further instructions. Report your blood sugar to the short stay nurse when you get to Short Stay.  If you are admitted to the hospital after surgery: Your blood sugar will be checked by the staff and  you will probably be given insulin  after surgery (instead of oral diabetes medicines) to make sure you have good blood sugar levels. The goal for blood sugar control after surgery is 80-180 mg/dL.   WHAT DO I DO ABOUT MY DIABETES MEDICATION?  THE NIGHT BEFORE SURGERY, take ONLY 7 units of levemir  insulin .DO NOT take lispro insulin .      THE MORNING OF SURGERY, take ONLY 5 units of levemir  insulin . DO NOT TAKE ANY ORAL DIABETIC MEDICATIONS DAY OF YOUR SURGERY  If your CBG is greater than 220 mg/dL, you may take  of your sliding scale  (correction) dose of insulin . : 3 units of lispro.  DO NOT TAKE THE FOLLOWING 7 DAYS PRIOR TO SURGERY: Ozempic, Wegovy, Rybelsus (Semaglutide), Byetta (exenatide), Bydureon (exenatide ER), Victoza, Saxenda  (liraglutide), or Trulicity  (dulaglutide ) Mounjaro (Tirzepatide) Adlyxin (Lixisenatide), Polyethylene Glycol Loxenatide. HOLD trulicity  after: 09/27/23                    You may not have any metal on your body including hair pins, jewelry, and body piercing             Do not wear make-up, lotions, powders, perfumes/cologne, or deodorant  Do not wear nail polish including gel and S&S, artificial/acrylic nails, or any other type of covering on natural nails including finger and toenails. If you have artificial nails, gel coating, etc. that needs to be removed by a nail salon please have this removed prior to surgery or surgery may need to be canceled/ delayed if the surgeon/ anesthesia feels like they are unable to be safely monitored.   Do not shave  48 hours prior to surgery.    Do not bring valuables to the hospital. [facility] IS NOT             RESPONSIBLE   FOR VALUABLES.   Contacts, glasses, or bridgework may not be worn into surgery.   Bring small overnight bag day of surgery.   DO NOT BRING YOUR HOME MEDICATIONS TO THE HOSPITAL. PHARMACY WILL DISPENSE MEDICATIONS LISTED ON YOUR MEDICATION LIST TO YOU DURING YOUR ADMISSION IN THE HOSPITAL!    Patients discharged on the day of surgery will not be allowed to drive home.  Someone NEEDS to stay with you for the first 24 hours after anesthesia.   Special Instructions: Bring a copy of your healthcare power of attorney and living will documents         the day of surgery if you haven't scanned them before.              Please read over the following fact sheets you were given: IF YOU HAVE QUESTIONS ABOUT YOUR PRE-OP INSTRUCTIONS PLEASE CALL [phone]    [facility] - Preparing for Surgery Before surgery, you can play an important role.  Because skin is not sterile, your skin needs to be as free of germs as possible.  You can reduce the number of germs on your skin by washing with CHG (chlorahexidine gluconate) soap before surgery.  CHG  is an antiseptic cleaner which kills germs and bonds with the skin to continue killing germs even after washing. Please DO NOT use if you have an allergy to CHG or antibacterial soaps.  If your skin becomes reddened/irritated stop using the CHG and inform your nurse when you arrive at Short Stay. Do not shave (including legs and underarms) for at least 48 hours prior to the first CHG shower.  You may shave  your face/neck. Please follow these instructions carefully:  1.  Shower with CHG Soap the night before surgery and the  morning of Surgery.  2.  If you choose to wash your hair, wash your hair first as usual with your  normal  shampoo.  3.  After you shampoo, rinse your hair and body thoroughly to remove the  shampoo.                           4.  Use CHG as you would any other liquid soap.  You can apply chg directly  to the skin and wash                       Gently with a scrungie or clean washcloth.  5.  Apply the CHG Soap to your body ONLY FROM THE NECK DOWN.   Do not use on face/ open                           Wound or open sores. Avoid contact with eyes, ears mouth and genitals (private parts).                       Wash face,  Genitals (private parts) with your normal soap.             6.  Wash thoroughly, paying special attention to the area where your surgery  will be performed.  7.  Thoroughly rinse your body with warm water  from the neck down.  8.  DO NOT shower/wash with your normal soap after using and rinsing off  the CHG Soap.                9.  Pat yourself dry with a clean towel.            10.  Wear clean pajamas.            11.  Place clean sheets on your bed the night of your first shower and do not  sleep with pets. Day of Surgery : Do not apply any lotions/deodorants the morning of surgery.  Please wear clean clothes to the hospital/surgery center.  FAILURE TO FOLLOW THESE INSTRUCTIONS MAY RESULT IN THE CANCELLATION OF YOUR SURGERY PATIENT  SIGNATURE_________________________________  NURSE SIGNATURE__________________________________  ________________________________________________________________________

## 2023-09-22 ENCOUNTER — Encounter (HOSPITAL_COMMUNITY)
Admission: RE | Admit: 2023-09-22 | Discharge: 2023-09-22 | Disposition: A | Discharge status: Home / Self Care | Payer: Medicare Other | Source: Ambulatory Visit | Attending: Psychiatry | Admitting: Psychiatry

## 2023-09-22 ENCOUNTER — Encounter (HOSPITAL_COMMUNITY): Payer: Self-pay

## 2023-09-22 ENCOUNTER — Other Ambulatory Visit: Payer: Self-pay

## 2023-09-22 ENCOUNTER — Telehealth: Payer: Self-pay | Admitting: *Deleted

## 2023-09-22 VITALS — BP 113/77 | HR 75 | Temp 97.6°F | Ht 64.0 in

## 2023-09-22 DIAGNOSIS — E1169 Type 2 diabetes mellitus with other specified complication: Secondary | ICD-10-CM | POA: Insufficient documentation

## 2023-09-22 DIAGNOSIS — E785 Hyperlipidemia, unspecified: Secondary | ICD-10-CM | POA: Insufficient documentation

## 2023-09-22 DIAGNOSIS — I10 Essential (primary) hypertension: Secondary | ICD-10-CM | POA: Diagnosis not present

## 2023-09-22 DIAGNOSIS — R030 Elevated blood-pressure reading, without diagnosis of hypertension: Secondary | ICD-10-CM

## 2023-09-22 DIAGNOSIS — Z01818 Encounter for other preprocedural examination: Secondary | ICD-10-CM | POA: Diagnosis present

## 2023-09-22 HISTORY — DX: Dyspnea, unspecified: R06.00

## 2023-09-22 HISTORY — DX: Malignant (primary) neoplasm, unspecified: C80.1

## 2023-09-22 LAB — SURGICAL PCR SCREEN
MRSA, PCR: NEGATIVE
Staphylococcus aureus: NEGATIVE

## 2023-09-22 LAB — GLUCOSE, CAPILLARY: Glucose-Capillary: 406 mg/dL — ABNORMAL HIGH (ref 70–99)

## 2023-09-22 NOTE — Telephone Encounter (Signed)
 Patient's PCP office called [facility] at [facility] [place], [place] [phone]. Made them aware that patient had her pre-op admission appt. Today for her surgery with Dr. Daisey Dryer on 10/05/23 and her CBG was 406. Pt did eat lunch prior to this 2 pm appt. And stated that her PCP stopped the humalog insulin . [facility] states they will have the nurse call the office back.

## 2023-09-22 NOTE — Progress Notes (Addendum)
 For Anesthesia: PCP - Dickey Fought, PA  Cardiologist - N/A  Bowel Prep reminder:N/A  Chest x-ray -  EKG - 09/22/23 Stress Test -  ECHO - 04/08/21 Cardiac Cath -  Pacemaker/ICD device last checked: Pacemaker orders received: Device Rep notified:  Spinal Cord Stimulator:N/A  Sleep Study - N/A CPAP -   Fasting Blood Sugar - 100's Checks Blood Sugar __1___ times a day Date and result of last Hgb A1c-  Last dose of GLP1 agonist- Dulaglutide  GLP1 instructions: To hold after: 09/27/23  Last dose of SGLT-2 inhibitors- N/A SGLT-2 instructions:   Blood Thinner Instructions:N/A Aspirin  Instructions: Last Dose:  Activity level: Can go up a flight of stairs and activities of daily living without stopping and without chest pain and/or shortness of breath   Able to exercise without chest pain and/or shortness of breath   Unable to go up a flight of stairs without shortness of breath    Anesthesia review: Hx: Smoker,CKD I,DIA,HTN. CBG: 406 during PST visit. Camilo Cella PA, and [employer] were notified.  Patient denies shortness of breath, fever, cough and chest pain at PAT appointment   Patient verbalized understanding of instructions that were given to them at the PAT appointment. Patient was also instructed that they will need to review over the PAT instructions again at home before surgery.

## 2023-09-23 ENCOUNTER — Encounter: Payer: Self-pay | Admitting: *Deleted

## 2023-09-23 ENCOUNTER — Telehealth: Payer: Self-pay | Admitting: *Deleted

## 2023-09-23 ENCOUNTER — Encounter (HOSPITAL_COMMUNITY): Payer: Self-pay | Admitting: Physician Assistant

## 2023-09-23 ENCOUNTER — Encounter: Payer: Self-pay | Admitting: Obstetrics and Gynecology

## 2023-09-23 NOTE — Telephone Encounter (Signed)
LMOM for the patient's Carolyn Norton to call the office back. Need to see if the patient has a cardiologist

## 2023-09-23 NOTE — Telephone Encounter (Addendum)
Spoke with Dewayne Hatch and patient has not seen a cardiologist

## 2023-09-23 NOTE — Telephone Encounter (Signed)
Spoke with Jan, RN from Pt's PCP office -[facility] who called the office stating that patient's Aunt, [name] called them and states that patient is a Type 1 diabetic and has been since she was 11 yrs. old. They will refer patient to endocrinologist and update the office once they have more information.

## 2023-09-23 NOTE — Telephone Encounter (Signed)
Spoke with patient's PCP's nurse Jan this morning. States they saw Ms. Lovecchio on 1/9 and her blood glucose was over 400 and pt was instructed to return back in 1 week to have her CBG rechecked. According to Jan pt refused because her parents wouldn't be able to bring her back. Jan also states they stopped the humalog because they had concerns that pt wouldn't take it correctly. She is on Lantus (Glargine-yfgn)  30 units in the am and 20 units at bedtime.   Medication list updated and med list was faxed to our office in regards to patient's diabetic medication that were not listed on her chart. Jan also states they wanted to set up a Home Health Nurse for medication education and management with family but patient also refused.   Jan states she doesn't have the patient's Celine Ahr, [name] as a contact only has her father Leanne Rametta and the patient comes into the office by herself.  Advised Jan that Ms. Shon Baton is Ms. Cassar's medical power of attorney.   Message relayed to providers.   Ms. Shon Baton called the office stating that she had a message to call Dr. Butterfield Blas office. Advised Ms. Brook's that we maybe needing a cardiology referral for pt due to EKG that was completed yesterday at pre-op appt. And the office  would let her know. Also spoke with Ms. Shon Baton in regards to patient's increased blood sugars and PCP. Ms. Shon Baton was unaware of new PCP since Ms. Kercheval was in a nursing home up until June 2024. PCP's name Carin Hock, [place] with [facility] at [facility] # [phone] given to Ms. Brooks and she will call them to follow up and bring POA paper work to them. Ms. Shon Baton thanked the office.

## 2023-09-27 ENCOUNTER — Telehealth: Payer: Self-pay | Admitting: Surgery

## 2023-09-27 NOTE — Telephone Encounter (Signed)
LVM to call back and review below information with patient.

## 2023-09-27 NOTE — Telephone Encounter (Signed)
Cardiology clearance not needed

## 2023-09-27 NOTE — Telephone Encounter (Signed)
-----   Message from Doylene Bode sent at 09/27/2023  9:47 AM EST ----- Please let the patient/family know anesthesia DOES NOT feel she needs cardiac clearance for the procedure with Dr. Alvester Morin. They are more concerned about her glucose being high the day of surgery. Know PCP is working on this with her

## 2023-09-27 NOTE — Telephone Encounter (Signed)
Patient returned call. Advised patient that cardiac clearance is no longer needed, however anesthesia was concerned about her blood sugar being so high. Patient stated she has this under control now and her blood sugar this AM was 77. Requested that patient keep track of her fasting blood sugar for the next week until her surgery and that she give those numbers to [place] when we call for her pre-op on 1/27. Patient agreeable to this and had no other concerns at this time.

## 2023-09-28 ENCOUNTER — Telehealth: Payer: Self-pay | Admitting: Psychiatry

## 2023-09-28 NOTE — Telephone Encounter (Signed)
Called pt's aunt (HCDM) regarding MRI results. Given deep myometrial invasion, IUD/hormonal treatment alone is no longer a sufficient option for treatment. However, her glucose control is also poor with A1c of 12.3. So we may need to do some hormonal treatment as temporizing measure while awaiting some improvement on glucose.   Options forward would be to proceed with D&C, IUD next week, but I would feel comfortable if we cancelled and started her on an oral progesterone like megace, knowing that we are only using this temporarily. This would be if we feel that she will be able to regularly take her medication. Side effects reviewed.  I feel that if we can get some improvement in glucose control, she would be an appropriate candidate for surgical management of endometrial cancer.  Alternatively, could consider definitive treatment with radiation. But this would entail a more intensive time commitment for treatment.  Pt's aunt will discuss with pt/family and let us know what they decide. If cancelling surgery, will plan to send Rx for megace and plan for follow-up in approximately 2months to reassess progress on sugar control.

## 2023-09-29 ENCOUNTER — Telehealth: Payer: Self-pay | Admitting: *Deleted

## 2023-09-29 NOTE — Telephone Encounter (Signed)
Spoke with patient's Aunt Carolyn Norton states that they decided to cancel Ms. Carolyn Norton's surgery and do the pill (Megace).  Ms. Carolyn Norton is asking if the pill is minimal can Carolyn Norton still do radiation?  Advised Ms. Carolyn Norton that her message would be relayed to provider and a 2 month follow up appt. With Dr. Alvester Norton has been scheduled for Monday, March 17 th. At 3:30, Ms. Carolyn Norton will be accompanying patient to this appt. And agreed to date and time.

## 2023-10-04 ENCOUNTER — Encounter: Payer: Self-pay | Admitting: Gynecologic Oncology

## 2023-10-04 ENCOUNTER — Telehealth: Payer: Self-pay

## 2023-10-04 ENCOUNTER — Other Ambulatory Visit: Payer: Self-pay | Admitting: Gynecologic Oncology

## 2023-10-04 DIAGNOSIS — C541 Malignant neoplasm of endometrium: Secondary | ICD-10-CM

## 2023-10-04 MED ORDER — MEGESTROL ACETATE 40 MG PO TABS: 80.0000 mg | ORAL_TABLET | Freq: Two times a day (BID) | ORAL | 3 refills | Status: DC

## 2023-10-04 NOTE — Telephone Encounter (Signed)
Spoke with Patient's Aunt [name] and relayed message from Warner Mccreedy, NP that a prescription has been sent in to patient's pharmacy Megace 80 mg twice daily and information in regards to medication is listed on MyChart. Also relayed message to read the list of possible side effects being the most common that is increased appetite, weight gain and gi upset. Another uncommon side effect is blood clots. S/S in arms or legs, redness, swelling, warmth and pain. Sudden Shortness of Breath, chest pain and or sudden severe headache. Ms. Shon Baton verbalized understanding and thanked the office for calling.    Also called patient and made her aware that Rx for Megace has been sent to her pharmacy and to check her MyChart for information on Megace. [place]i verbalized understanding and thanked the office for calling.

## 2023-10-04 NOTE — Telephone Encounter (Signed)
Pt's power of attorney, [name], called office stating Ms.Hilligoss's surgery was cancelled d/t she was wanting to start the pill. (See previous messages) Medication has not been sent to pharmacy on file.   Message sent to Warner Mccreedy NP.

## 2023-10-05 ENCOUNTER — Encounter (HOSPITAL_COMMUNITY): Admission: RE | Payer: Self-pay | Source: Ambulatory Visit

## 2023-10-05 ENCOUNTER — Ambulatory Visit (HOSPITAL_COMMUNITY): Admission: RE | Admit: 2023-10-05 | Payer: Medicare Other | Source: Ambulatory Visit | Admitting: Psychiatry

## 2023-10-05 DIAGNOSIS — C541 Malignant neoplasm of endometrium: Secondary | ICD-10-CM

## 2023-10-05 SURGERY — EXAM UNDER ANESTHESIA, PELVIC
Anesthesia: Choice

## 2023-10-06 ENCOUNTER — Ambulatory Visit
Admission: RE | Admit: 2023-10-06 | Discharge: 2023-10-06 | Disposition: A | Discharge status: Home / Self Care | Payer: Medicare Other | Source: Ambulatory Visit | Attending: Neurosurgery | Admitting: Neurosurgery

## 2023-10-06 DIAGNOSIS — G912 (Idiopathic) normal pressure hydrocephalus: Secondary | ICD-10-CM

## 2023-10-13 ENCOUNTER — Telehealth: Payer: Self-pay | Admitting: *Deleted

## 2023-10-13 NOTE — Telephone Encounter (Signed)
 Patient called office back and apapt moved per provider from 3/17 to 3/3

## 2023-10-18 ENCOUNTER — Encounter: Payer: Medicare Other | Admitting: Psychiatry

## 2023-10-30 ENCOUNTER — Inpatient Hospital Stay (HOSPITAL_COMMUNITY): Payer: Medicare Other

## 2023-10-30 ENCOUNTER — Inpatient Hospital Stay (HOSPITAL_COMMUNITY)
Admission: EM | Admit: 2023-10-30 | Discharge: 2023-11-05 | DRG: 871 | Disposition: A | Discharge status: Skilled Nursing Facility | Payer: Medicare Other | Attending: Internal Medicine | Admitting: Internal Medicine

## 2023-10-30 ENCOUNTER — Emergency Department (HOSPITAL_COMMUNITY): Payer: Medicare Other

## 2023-10-30 DIAGNOSIS — E1011 Type 1 diabetes mellitus with ketoacidosis with coma: Principal | ICD-10-CM | POA: Diagnosis present

## 2023-10-30 DIAGNOSIS — A4159 Other Gram-negative sepsis: Principal | ICD-10-CM | POA: Diagnosis present

## 2023-10-30 DIAGNOSIS — E1022 Type 1 diabetes mellitus with diabetic chronic kidney disease: Secondary | ICD-10-CM | POA: Diagnosis present

## 2023-10-30 DIAGNOSIS — E875 Hyperkalemia: Secondary | ICD-10-CM | POA: Diagnosis present

## 2023-10-30 DIAGNOSIS — Z8051 Family history of malignant neoplasm of kidney: Secondary | ICD-10-CM

## 2023-10-30 DIAGNOSIS — Z79899 Other long term (current) drug therapy: Secondary | ICD-10-CM

## 2023-10-30 DIAGNOSIS — Z881 Allergy status to other antibiotic agents status: Secondary | ICD-10-CM

## 2023-10-30 DIAGNOSIS — E10649 Type 1 diabetes mellitus with hypoglycemia without coma: Secondary | ICD-10-CM | POA: Diagnosis not present

## 2023-10-30 DIAGNOSIS — R6521 Severe sepsis with septic shock: Secondary | ICD-10-CM | POA: Diagnosis present

## 2023-10-30 DIAGNOSIS — G912 (Idiopathic) normal pressure hydrocephalus: Secondary | ICD-10-CM | POA: Diagnosis present

## 2023-10-30 DIAGNOSIS — Z91138 Patient's unintentional underdosing of medication regimen for other reason: Secondary | ICD-10-CM

## 2023-10-30 DIAGNOSIS — G309 Alzheimer's disease, unspecified: Secondary | ICD-10-CM | POA: Diagnosis present

## 2023-10-30 DIAGNOSIS — J9601 Acute respiratory failure with hypoxia: Secondary | ICD-10-CM | POA: Diagnosis present

## 2023-10-30 DIAGNOSIS — F319 Bipolar disorder, unspecified: Secondary | ICD-10-CM | POA: Diagnosis present

## 2023-10-30 DIAGNOSIS — I129 Hypertensive chronic kidney disease with stage 1 through stage 4 chronic kidney disease, or unspecified chronic kidney disease: Secondary | ICD-10-CM | POA: Diagnosis present

## 2023-10-30 DIAGNOSIS — Z7985 Long-term (current) use of injectable non-insulin antidiabetic drugs: Secondary | ICD-10-CM

## 2023-10-30 DIAGNOSIS — Z1152 Encounter for screening for COVID-19: Secondary | ICD-10-CM

## 2023-10-30 DIAGNOSIS — N170 Acute kidney failure with tubular necrosis: Secondary | ICD-10-CM | POA: Diagnosis present

## 2023-10-30 DIAGNOSIS — E66811 Obesity, class 1: Secondary | ICD-10-CM | POA: Diagnosis present

## 2023-10-30 DIAGNOSIS — Z8249 Family history of ischemic heart disease and other diseases of the circulatory system: Secondary | ICD-10-CM

## 2023-10-30 DIAGNOSIS — Z7984 Long term (current) use of oral hypoglycemic drugs: Secondary | ICD-10-CM

## 2023-10-30 DIAGNOSIS — N189 Chronic kidney disease, unspecified: Secondary | ICD-10-CM | POA: Diagnosis present

## 2023-10-30 DIAGNOSIS — B957 Other staphylococcus as the cause of diseases classified elsewhere: Secondary | ICD-10-CM | POA: Diagnosis present

## 2023-10-30 DIAGNOSIS — Y92009 Unspecified place in unspecified non-institutional (private) residence as the place of occurrence of the external cause: Secondary | ICD-10-CM

## 2023-10-30 DIAGNOSIS — Z66 Do not resuscitate: Secondary | ICD-10-CM | POA: Diagnosis present

## 2023-10-30 DIAGNOSIS — Z8542 Personal history of malignant neoplasm of other parts of uterus: Secondary | ICD-10-CM

## 2023-10-30 DIAGNOSIS — Z8042 Family history of malignant neoplasm of prostate: Secondary | ICD-10-CM

## 2023-10-30 DIAGNOSIS — Z982 Presence of cerebrospinal fluid drainage device: Secondary | ICD-10-CM

## 2023-10-30 DIAGNOSIS — I10 Essential (primary) hypertension: Secondary | ICD-10-CM

## 2023-10-30 DIAGNOSIS — G9341 Metabolic encephalopathy: Secondary | ICD-10-CM | POA: Diagnosis present

## 2023-10-30 DIAGNOSIS — B964 Proteus (mirabilis) (morganii) as the cause of diseases classified elsewhere: Secondary | ICD-10-CM | POA: Diagnosis present

## 2023-10-30 DIAGNOSIS — F0283 Dementia in other diseases classified elsewhere, unspecified severity, with mood disturbance: Secondary | ICD-10-CM | POA: Diagnosis present

## 2023-10-30 DIAGNOSIS — Z6833 Body mass index (BMI) 33.0-33.9, adult: Secondary | ICD-10-CM

## 2023-10-30 DIAGNOSIS — C541 Malignant neoplasm of endometrium: Secondary | ICD-10-CM | POA: Diagnosis present

## 2023-10-30 DIAGNOSIS — R739 Hyperglycemia, unspecified: Secondary | ICD-10-CM

## 2023-10-30 DIAGNOSIS — F17210 Nicotine dependence, cigarettes, uncomplicated: Secondary | ICD-10-CM | POA: Diagnosis present

## 2023-10-30 DIAGNOSIS — I5A Non-ischemic myocardial injury (non-traumatic): Secondary | ICD-10-CM | POA: Diagnosis present

## 2023-10-30 DIAGNOSIS — N179 Acute kidney failure, unspecified: Secondary | ICD-10-CM | POA: Diagnosis not present

## 2023-10-30 DIAGNOSIS — E1110 Type 2 diabetes mellitus with ketoacidosis without coma: Secondary | ICD-10-CM | POA: Diagnosis not present

## 2023-10-30 DIAGNOSIS — R578 Other shock: Secondary | ICD-10-CM | POA: Diagnosis not present

## 2023-10-30 DIAGNOSIS — T383X6A Underdosing of insulin and oral hypoglycemic [antidiabetic] drugs, initial encounter: Secondary | ICD-10-CM | POA: Diagnosis present

## 2023-10-30 DIAGNOSIS — I2489 Other forms of acute ischemic heart disease: Secondary | ICD-10-CM | POA: Diagnosis present

## 2023-10-30 DIAGNOSIS — E785 Hyperlipidemia, unspecified: Secondary | ICD-10-CM | POA: Diagnosis present

## 2023-10-30 DIAGNOSIS — E876 Hypokalemia: Secondary | ICD-10-CM | POA: Diagnosis present

## 2023-10-30 DIAGNOSIS — A419 Sepsis, unspecified organism: Secondary | ICD-10-CM | POA: Diagnosis not present

## 2023-10-30 LAB — CBC WITH DIFFERENTIAL/PLATELET
Abs Immature Granulocytes: 0 10*3/uL (ref 0.00–0.07)
Basophils Absolute: 0.3 10*3/uL — ABNORMAL HIGH (ref 0.0–0.1)
Basophils Relative: 1 %
Eosinophils Absolute: 0 10*3/uL (ref 0.0–0.5)
Eosinophils Relative: 0 %
HCT: 38.2 % (ref 36.0–46.0)
Hemoglobin: 11.6 g/dL — ABNORMAL LOW (ref 12.0–15.0)
Lymphocytes Relative: 11 %
Lymphs Abs: 3 10*3/uL (ref 0.7–4.0)
MCH: 28.6 pg (ref 26.0–34.0)
MCHC: 30.4 g/dL (ref 30.0–36.0)
MCV: 94.1 fL (ref 80.0–100.0)
Monocytes Absolute: 1.1 10*3/uL — ABNORMAL HIGH (ref 0.1–1.0)
Monocytes Relative: 4 %
Neutro Abs: 23.2 10*3/uL — ABNORMAL HIGH (ref 1.7–7.7)
Neutrophils Relative %: 84 %
Platelets: 366 10*3/uL (ref 150–400)
RBC: 4.06 MIL/uL (ref 3.87–5.11)
RDW: 13.6 % (ref 11.5–15.5)
WBC: 27.6 10*3/uL — ABNORMAL HIGH (ref 4.0–10.5)
nRBC: 0 % (ref 0.0–0.2)
nRBC: 0 /100{WBCs}

## 2023-10-30 LAB — I-STAT ARTERIAL BLOOD GAS, ED
Acid-base deficit: 20 mmol/L — ABNORMAL HIGH (ref 0.0–2.0)
Bicarbonate: 9.6 mmol/L — ABNORMAL LOW (ref 20.0–28.0)
Calcium, Ion: 1.09 mmol/L — ABNORMAL LOW (ref 1.15–1.40)
HCT: 33 % — ABNORMAL LOW (ref 36.0–46.0)
Hemoglobin: 11.2 g/dL — ABNORMAL LOW (ref 12.0–15.0)
O2 Saturation: 100 %
Patient temperature: 96.5
Potassium: 6.2 mmol/L — ABNORMAL HIGH (ref 3.5–5.1)
Sodium: 129 mmol/L — ABNORMAL LOW (ref 135–145)
TCO2: 11 mmol/L — ABNORMAL LOW (ref 22–32)
pCO2 arterial: 34.3 mmHg (ref 32–48)
pH, Arterial: 7.049 — CL (ref 7.35–7.45)
pO2, Arterial: 385 mmHg — ABNORMAL HIGH (ref 83–108)

## 2023-10-30 LAB — BLOOD CULTURE ID PANEL (REFLEXED) - BCID2

## 2023-10-30 LAB — BASIC METABOLIC PANEL
Anion gap: 34 — ABNORMAL HIGH (ref 5–15)
Anion gap: 26 — ABNORMAL HIGH (ref 5–15)
Anion gap: 18 — ABNORMAL HIGH (ref 5–15)
Anion gap: 15 (ref 5–15)
BUN: 55 mg/dL — ABNORMAL HIGH (ref 6–20)
BUN: 51 mg/dL — ABNORMAL HIGH (ref 6–20)
BUN: 53 mg/dL — ABNORMAL HIGH (ref 6–20)
BUN: 52 mg/dL — ABNORMAL HIGH (ref 6–20)
BUN: 48 mg/dL — ABNORMAL HIGH (ref 6–20)
CO2: <7 mmol/L — ABNORMAL LOW (ref 22–32)
CO2: 9 mmol/L — ABNORMAL LOW (ref 22–32)
CO2: 14 mmol/L — ABNORMAL LOW (ref 22–32)
CO2: 23 mmol/L (ref 22–32)
CO2: 25 mmol/L (ref 22–32)
Calcium: 8.9 mg/dL (ref 8.9–10.3)
Calcium: 9.7 mg/dL (ref 8.9–10.3)
Calcium: 9.3 mg/dL (ref 8.9–10.3)
Calcium: 9.2 mg/dL (ref 8.9–10.3)
Calcium: 9.1 mg/dL (ref 8.9–10.3)
Chloride: 86 mmol/L — ABNORMAL LOW (ref 98–111)
Chloride: 92 mmol/L — ABNORMAL LOW (ref 98–111)
Chloride: 95 mmol/L — ABNORMAL LOW (ref 98–111)
Chloride: 96 mmol/L — ABNORMAL LOW (ref 98–111)
Chloride: 101 mmol/L (ref 98–111)
Creatinine, Ser: 4.32 mg/dL — ABNORMAL HIGH (ref 0.44–1.00)
Creatinine, Ser: 4.23 mg/dL — ABNORMAL HIGH (ref 0.44–1.00)
Creatinine, Ser: 4.31 mg/dL — ABNORMAL HIGH (ref 0.44–1.00)
Creatinine, Ser: 4 mg/dL — ABNORMAL HIGH (ref 0.44–1.00)
Creatinine, Ser: 3.71 mg/dL — ABNORMAL HIGH (ref 0.44–1.00)
GFR, Estimated: 11 mL/min — ABNORMAL LOW (ref >60)
GFR, Estimated: 12 mL/min — ABNORMAL LOW (ref >60)
GFR, Estimated: 11 mL/min — ABNORMAL LOW (ref >60)
GFR, Estimated: 13 mL/min — ABNORMAL LOW (ref >60)
GFR, Estimated: 14 mL/min — ABNORMAL LOW (ref >60)
Glucose, Bld: >1200 mg/dL (ref 70–99)
Glucose, Bld: 1062 mg/dL (ref 70–99)
Glucose, Bld: 882 mg/dL (ref 70–99)
Glucose, Bld: 593 mg/dL (ref 70–99)
Glucose, Bld: 259 mg/dL — ABNORMAL HIGH (ref 70–99)
Potassium: 7.1 mmol/L (ref 3.5–5.1)
Potassium: 5.2 mmol/L — ABNORMAL HIGH (ref 3.5–5.1)
Potassium: 4.1 mmol/L (ref 3.5–5.1)
Potassium: 3.6 mmol/L (ref 3.5–5.1)
Potassium: 3.5 mmol/L (ref 3.5–5.1)
Sodium: 131 mmol/L — ABNORMAL LOW (ref 135–145)
Sodium: 135 mmol/L (ref 135–145)
Sodium: 135 mmol/L (ref 135–145)
Sodium: 137 mmol/L (ref 135–145)
Sodium: 141 mmol/L (ref 135–145)

## 2023-10-30 LAB — GLUCOSE, CAPILLARY
Glucose-Capillary: >600 mg/dL (ref 70–99)
Glucose-Capillary: >600 mg/dL (ref 70–99)
Glucose-Capillary: >600 mg/dL (ref 70–99)
Glucose-Capillary: >600 mg/dL (ref 70–99)
Glucose-Capillary: >600 mg/dL (ref 70–99)
Glucose-Capillary: >600 mg/dL (ref 70–99)
Glucose-Capillary: >600 mg/dL (ref 70–99)
Glucose-Capillary: >600 mg/dL (ref 70–99)
Glucose-Capillary: >600 mg/dL (ref 70–99)
Glucose-Capillary: >600 mg/dL (ref 70–99)
Glucose-Capillary: >600 mg/dL (ref 70–99)
Glucose-Capillary: >600 mg/dL (ref 70–99)
Glucose-Capillary: >600 mg/dL (ref 70–99)
Glucose-Capillary: >600 mg/dL (ref 70–99)
Glucose-Capillary: >600 mg/dL (ref 70–99)
Glucose-Capillary: 542 mg/dL (ref 70–99)
Glucose-Capillary: >600 mg/dL (ref 70–99)
Glucose-Capillary: 490 mg/dL — ABNORMAL HIGH (ref 70–99)
Glucose-Capillary: 571 mg/dL (ref 70–99)
Glucose-Capillary: 457 mg/dL — ABNORMAL HIGH (ref 70–99)
Glucose-Capillary: 370 mg/dL — ABNORMAL HIGH (ref 70–99)
Glucose-Capillary: 315 mg/dL — ABNORMAL HIGH (ref 70–99)
Glucose-Capillary: 246 mg/dL — ABNORMAL HIGH (ref 70–99)

## 2023-10-30 LAB — URINALYSIS, ROUTINE W REFLEX MICROSCOPIC
Bilirubin Urine: NEGATIVE
Glucose, UA: >=500 mg/dL — AB
Ketones, ur: 5 mg/dL — AB
Leukocytes,Ua: NEGATIVE
Nitrite: NEGATIVE
Protein, ur: 30 mg/dL — AB
Specific Gravity, Urine: 1.02 (ref 1.005–1.030)
pH: 5 (ref 5.0–8.0)

## 2023-10-30 LAB — SARS CORONAVIRUS 2 BY RT PCR: SARS Coronavirus 2 by RT PCR: NEGATIVE

## 2023-10-30 LAB — POCT I-STAT 7, (LYTES, BLD GAS, ICA,H+H)
Acid-Base Excess: 0 mmol/L (ref 0.0–2.0)
Bicarbonate: 23.3 mmol/L (ref 20.0–28.0)
Calcium, Ion: 1.21 mmol/L (ref 1.15–1.40)
HCT: 35 % — ABNORMAL LOW (ref 36.0–46.0)
Hemoglobin: 11.9 g/dL — ABNORMAL LOW (ref 12.0–15.0)
O2 Saturation: 97 %
Potassium: 3.5 mmol/L (ref 3.5–5.1)
Sodium: 135 mmol/L (ref 135–145)
TCO2: 24 mmol/L (ref 22–32)
pCO2 arterial: 33.2 mmHg (ref 32–48)
pH, Arterial: 7.454 — ABNORMAL HIGH (ref 7.35–7.45)
pO2, Arterial: 89 mmHg (ref 83–108)

## 2023-10-30 LAB — ECHOCARDIOGRAM COMPLETE
AR max vel: 2.51 cm2
AV Area VTI: 2.43 cm2
AV Area mean vel: 2.73 cm2
AV Mean grad: 20.7 mmHg
AV Peak grad: 35.6 mmHg
Ao pk vel: 2.98 m/s
Area-P 1/2: 3.6 cm2
Height: 64 in
MV VTI: 4.17 cm2
S' Lateral: 1.9 cm
Weight: 2934.76 [oz_av]

## 2023-10-30 LAB — BETA-HYDROXYBUTYRIC ACID
Beta-Hydroxybutyric Acid: >8 mmol/L — ABNORMAL HIGH (ref 0.05–0.27)
Beta-Hydroxybutyric Acid: >8 mmol/L — ABNORMAL HIGH (ref 0.05–0.27)
Beta-Hydroxybutyric Acid: 7.34 mmol/L — ABNORMAL HIGH (ref 0.05–0.27)
Beta-Hydroxybutyric Acid: 1.31 mmol/L — ABNORMAL HIGH (ref 0.05–0.27)
Beta-Hydroxybutyric Acid: 0.09 mmol/L (ref 0.05–0.27)

## 2023-10-30 LAB — I-STAT VENOUS BLOOD GAS, ED
Acid-base deficit: 26 mmol/L — ABNORMAL HIGH (ref 0.0–2.0)
Bicarbonate: 5.8 mmol/L — ABNORMAL LOW (ref 20.0–28.0)
Calcium, Ion: 1.02 mmol/L — ABNORMAL LOW (ref 1.15–1.40)
HCT: 41 % (ref 36.0–46.0)
Hemoglobin: 13.9 g/dL (ref 12.0–15.0)
O2 Saturation: 98 %
Potassium: 7.2 mmol/L (ref 3.5–5.1)
Sodium: 124 mmol/L — ABNORMAL LOW (ref 135–145)
TCO2: 7 mmol/L — ABNORMAL LOW (ref 22–32)
pCO2, Ven: 29.5 mmHg — ABNORMAL LOW (ref 44–60)
pH, Ven: 6.9 — CL (ref 7.25–7.43)
pO2, Ven: 172 mmHg — ABNORMAL HIGH (ref 32–45)

## 2023-10-30 LAB — RESPIRATORY PANEL BY PCR

## 2023-10-30 LAB — HIV ANTIBODY (ROUTINE TESTING W REFLEX): HIV Screen 4th Generation wRfx: NONREACTIVE

## 2023-10-30 LAB — TROPONIN I (HIGH SENSITIVITY)
Troponin I (High Sensitivity): 138 ng/L (ref <18)
Troponin I (High Sensitivity): 273 ng/L (ref <18)

## 2023-10-30 LAB — MRSA NEXT GEN BY PCR, NASAL: MRSA by PCR Next Gen: NOT DETECTED

## 2023-10-30 LAB — PROCALCITONIN: Procalcitonin: 12.37 ng/mL

## 2023-10-30 LAB — CBG MONITORING, ED
Glucose-Capillary: >600 mg/dL (ref 70–99)
Glucose-Capillary: >600 mg/dL (ref 70–99)
Glucose-Capillary: >600 mg/dL (ref 70–99)

## 2023-10-30 LAB — LACTIC ACID, PLASMA: Lactic Acid, Venous: 4 mmol/L (ref 0.5–1.9)

## 2023-10-30 MED ORDER — FENTANYL CITRATE PF 50 MCG/ML IJ SOSY
50.0000 ug | PREFILLED_SYRINGE | INTRAMUSCULAR | Status: DC | PRN
  Administered 2023-10-30 (×2): 100 ug via INTRAVENOUS
  Filled 2023-10-30 (×3): qty 2

## 2023-10-30 MED ORDER — NOREPINEPHRINE 16 MG/250ML-% IV SOLN
0.0000 ug/min | INTRAVENOUS | Status: DC
  Administered 2023-10-30: 30 ug/min via INTRAVENOUS
  Administered 2023-10-30: 18 ug/min via INTRAVENOUS
  Filled 2023-10-30 (×2): qty 250

## 2023-10-30 MED ORDER — PROPOFOL 1000 MG/100ML IV EMUL
5.0000 ug/kg/min | INTRAVENOUS | Status: DC
  Administered 2023-10-30 (×2): 20 ug/kg/min via INTRAVENOUS
  Filled 2023-10-30 (×4): qty 100

## 2023-10-30 MED ORDER — POLYETHYLENE GLYCOL 3350 17 G PO PACK
17.0000 g | PACK | Freq: Every day | ORAL | Status: DC
  Administered 2023-10-30: 17 g
  Filled 2023-10-30: qty 1

## 2023-10-30 MED ORDER — LACTATED RINGERS IV BOLUS
1000.0000 mL | Freq: Once | INTRAVENOUS | Status: AC
  Administered 2023-10-30: 1000 mL via INTRAVENOUS

## 2023-10-30 MED ORDER — ROCURONIUM BROMIDE 10 MG/ML (PF) SYRINGE
PREFILLED_SYRINGE | INTRAVENOUS | Status: DC | PRN
  Administered 2023-10-30: 100 mg via INTRAVENOUS

## 2023-10-30 MED ORDER — INSULIN REGULAR(HUMAN) IN NACL 100-0.9 UT/100ML-% IV SOLN
INTRAVENOUS | Status: DC
  Administered 2023-10-30: 7.5 [IU]/h via INTRAVENOUS
  Administered 2023-10-30 (×2): 15 [IU]/h via INTRAVENOUS
  Filled 2023-10-30 (×4): qty 100

## 2023-10-30 MED ORDER — POLYETHYLENE GLYCOL 3350 17 G PO PACK
17.0000 g | PACK | Freq: Every day | ORAL | Status: DC | PRN
Start: 2023-10-30 — End: 2023-11-05

## 2023-10-30 MED ORDER — VASOPRESSIN 20 UNITS/100 ML INFUSION FOR SHOCK
0.0400 [IU]/min | INTRAVENOUS | Status: DC
  Administered 2023-10-30 – 2023-10-31 (×3): 0.04 [IU]/min via INTRAVENOUS
  Filled 2023-10-30 (×4): qty 100

## 2023-10-30 MED ORDER — SODIUM CHLORIDE 0.9% FLUSH
10.0000 mL | Freq: Two times a day (BID) | INTRAVENOUS | Status: DC
  Administered 2023-10-30 – 2023-11-04 (×11): 10 mL

## 2023-10-30 MED ORDER — SODIUM BICARBONATE 8.4 % IV SOLN
100.0000 meq | Freq: Once | INTRAVENOUS | Status: AC
  Administered 2023-10-30: 100 meq via INTRAVENOUS
  Filled 2023-10-30: qty 50

## 2023-10-30 MED ORDER — NOREPINEPHRINE 4 MG/250ML-% IV SOLN
INTRAVENOUS | Status: AC
  Administered 2023-10-30: 12.5 ug/min via INTRAVENOUS
  Administered 2023-10-30: 2 mg via INTRAVENOUS
  Filled 2023-10-30: qty 250

## 2023-10-30 MED ORDER — LACTATED RINGERS IV BOLUS
1000.0000 mL | INTRAVENOUS | Status: AC
  Administered 2023-10-30: 1000 mL via INTRAVENOUS

## 2023-10-30 MED ORDER — SODIUM CHLORIDE 0.9% FLUSH: 10.0000 mL | INTRAVENOUS | Status: DC | PRN

## 2023-10-30 MED ORDER — SODIUM ZIRCONIUM CYCLOSILICATE 10 G PO PACK
10.0000 g | PACK | ORAL | Status: AC
  Administered 2023-10-30: 10 g
  Filled 2023-10-30: qty 1

## 2023-10-30 MED ORDER — CALCIUM GLUCONATE-NACL 2-0.675 GM/100ML-% IV SOLN
2.0000 g | Freq: Once | INTRAVENOUS | Status: DC
  Filled 2023-10-30: qty 100

## 2023-10-30 MED ORDER — ACETAMINOPHEN 160 MG/5ML PO SOLN
650.0000 mg | Freq: Four times a day (QID) | ORAL | Status: DC | PRN
  Administered 2023-10-30 – 2023-10-31 (×2): 650 mg
  Filled 2023-10-30 (×2): qty 20.3

## 2023-10-30 MED ORDER — DEXTROSE IN LACTATED RINGERS 5 % IV SOLN: INTRAVENOUS | Status: AC

## 2023-10-30 MED ORDER — ORAL CARE MOUTH RINSE: 15.0000 mL | OROMUCOSAL | Status: DC | PRN

## 2023-10-30 MED ORDER — PIPERACILLIN-TAZOBACTAM 3.375 G IVPB 30 MIN
3.3750 g | Freq: Once | INTRAVENOUS | Status: AC
  Administered 2023-10-30: 3.375 g via INTRAVENOUS
  Filled 2023-10-30: qty 50

## 2023-10-30 MED ORDER — LACTATED RINGERS IV SOLN: INTRAVENOUS | Status: AC

## 2023-10-30 MED ORDER — PIPERACILLIN-TAZOBACTAM IN DEX 2-0.25 GM/50ML IV SOLN
2.2500 g | Freq: Three times a day (TID) | INTRAVENOUS | Status: DC
  Administered 2023-10-30 – 2023-10-31 (×3): 2.25 g via INTRAVENOUS
  Filled 2023-10-30 (×4): qty 50

## 2023-10-30 MED ORDER — SODIUM CHLORIDE 0.9 % IV SOLN
2.0000 g | Freq: Once | INTRAVENOUS | Status: AC
  Administered 2023-10-30: 2 g via INTRAVENOUS
  Filled 2023-10-30: qty 20

## 2023-10-30 MED ORDER — POTASSIUM CHLORIDE 20 MEQ PO PACK
20.0000 meq | PACK | ORAL | Status: AC
  Administered 2023-10-31 (×2): 20 meq
  Filled 2023-10-30 (×2): qty 1

## 2023-10-30 MED ORDER — MIDAZOLAM HCL 2 MG/2ML IJ SOLN: 1.0000 mg | INTRAMUSCULAR | Status: DC | PRN

## 2023-10-30 MED ORDER — DOCUSATE SODIUM 50 MG/5ML PO LIQD: 100.0000 mg | Freq: Two times a day (BID) | ORAL | Status: DC | PRN

## 2023-10-30 MED ORDER — HEPARIN SODIUM (PORCINE) 5000 UNIT/ML IJ SOLN
5000.0000 [IU] | Freq: Three times a day (TID) | INTRAMUSCULAR | Status: DC
  Administered 2023-10-30 – 2023-11-05 (×18): 5000 [IU] via SUBCUTANEOUS
  Filled 2023-10-30 (×19): qty 1

## 2023-10-30 MED ORDER — VANCOMYCIN VARIABLE DOSE PER UNSTABLE RENAL FUNCTION (PHARMACIST DOSING): Status: DC

## 2023-10-30 MED ORDER — NOREPINEPHRINE 4 MG/250ML-% IV SOLN
0.0000 ug/min | INTRAVENOUS | Status: DC
Start: 2023-10-30 — End: 2023-10-30
  Administered 2023-10-30: 24 ug/min via INTRAVENOUS
  Administered 2023-10-30: 29 ug/min via INTRAVENOUS
  Administered 2023-10-30: 14 ug/min via INTRAVENOUS
  Filled 2023-10-30 (×2): qty 250

## 2023-10-30 MED ORDER — DOCUSATE SODIUM 50 MG/5ML PO LIQD
100.0000 mg | Freq: Two times a day (BID) | ORAL | Status: DC
  Administered 2023-10-30 (×2): 100 mg
  Filled 2023-10-30 (×2): qty 10

## 2023-10-30 MED ORDER — DEXTROSE 50 % IV SOLN: 0.0000 mL | INTRAVENOUS | Status: DC | PRN

## 2023-10-30 MED ORDER — SODIUM BICARBONATE 8.4 % IV SOLN
100.0000 meq | INTRAVENOUS | Status: AC
  Administered 2023-10-30: 100 meq via INTRAVENOUS
  Filled 2023-10-30: qty 100

## 2023-10-30 MED ORDER — EPINEPHRINE 1 MG/10ML IJ SOSY
PREFILLED_SYRINGE | INTRAMUSCULAR | Status: DC | PRN
  Administered 2023-10-30: .5 mg via INTRAVENOUS

## 2023-10-30 MED ORDER — FREE WATER
200.0000 mL | Status: DC
  Administered 2023-10-30 – 2023-10-31 (×6): 200 mL

## 2023-10-30 MED ORDER — CHLORHEXIDINE GLUCONATE CLOTH 2 % EX PADS
6.0000 | MEDICATED_PAD | Freq: Every day | CUTANEOUS | Status: DC
  Administered 2023-10-30 – 2023-11-04 (×6): 6 via TOPICAL

## 2023-10-30 MED ORDER — FENTANYL CITRATE PF 50 MCG/ML IJ SOSY
50.0000 ug | PREFILLED_SYRINGE | INTRAMUSCULAR | Status: DC | PRN
  Administered 2023-10-30 – 2023-10-31 (×2): 50 ug via INTRAVENOUS
  Filled 2023-10-30 (×2): qty 1

## 2023-10-30 MED ORDER — PERFLUTREN LIPID MICROSPHERE
1.0000 mL | INTRAVENOUS | Status: AC | PRN
  Administered 2023-10-30: 3 mL via INTRAVENOUS

## 2023-10-30 MED ORDER — PROPOFOL 1000 MG/100ML IV EMUL
INTRAVENOUS | Status: AC
  Administered 2023-10-30: 20 ug/kg/min via INTRAVENOUS
  Filled 2023-10-30: qty 100

## 2023-10-30 MED ORDER — ETOMIDATE 2 MG/ML IV SOLN
INTRAVENOUS | Status: DC | PRN
  Administered 2023-10-30: 20 mg via INTRAVENOUS

## 2023-10-30 MED ORDER — CALCIUM GLUCONATE-NACL 1-0.675 GM/50ML-% IV SOLN: 1.0000 g | Freq: Once | INTRAVENOUS | Status: DC

## 2023-10-30 MED ORDER — POTASSIUM CHLORIDE 20 MEQ PO PACK
20.0000 meq | PACK | Freq: Once | ORAL | Status: AC
  Administered 2023-10-30: 20 meq
  Filled 2023-10-30: qty 1

## 2023-10-30 MED ORDER — INSULIN ASPART 100 UNIT/ML IV SOLN
10.0000 [IU] | Freq: Once | INTRAVENOUS | Status: AC
  Administered 2023-10-30: 10 [IU] via INTRAVENOUS

## 2023-10-30 MED ORDER — ORAL CARE MOUTH RINSE
15.0000 mL | OROMUCOSAL | Status: DC
  Administered 2023-10-30 – 2023-11-01 (×14): 15 mL via OROMUCOSAL

## 2023-10-30 MED ORDER — VANCOMYCIN HCL 1750 MG/350ML IV SOLN
1750.0000 mg | Freq: Once | INTRAVENOUS | Status: AC
  Administered 2023-10-30: 1750 mg via INTRAVENOUS
  Filled 2023-10-30: qty 350

## 2023-10-30 MED ORDER — PANTOPRAZOLE SODIUM 40 MG IV SOLR
40.0000 mg | INTRAVENOUS | Status: DC
  Administered 2023-10-30 – 2023-10-31 (×2): 40 mg via INTRAVENOUS
  Filled 2023-10-30 (×2): qty 10

## 2023-10-30 MED ORDER — FENTANYL 2500MCG IN NS 250ML (10MCG/ML) PREMIX INFUSION
INTRAVENOUS | Status: AC
  Filled 2023-10-30: qty 250

## 2023-10-30 MED ORDER — PROPOFOL 1000 MG/100ML IV EMUL: 5.0000 ug/kg/min | INTRAVENOUS | Status: DC

## 2023-10-30 NOTE — Progress Notes (Signed)
 PHARMACY - PHYSICIAN COMMUNICATION CRITICAL VALUE ALERT - BLOOD CULTURE IDENTIFICATION (BCID)  Carolyn Norton is an 57 y.o. female who presented to [facility] on 10/30/2023 with a chief complaint of DKA and sepsis  Assessment:  1/3 Proteus, no ESBL resistance detected  Name of physician (or Provider) Contacted: Dr. Larinda Buttery   Current antibiotics: Zosyn + Vancomycin  Changes to prescribed antibiotics recommended:  Stop Vancomycin/Zosyn, start CRO.  Recommendations declined by provider -  wanted rounding team to address in the morning.   Results for orders placed or performed during the hospital encounter of 10/30/23  Blood Culture ID Panel (Reflexed) (Collected: 10/30/2023  6:13 AM)  Result Value Ref Range   Enterococcus faecalis NOT DETECTED NOT DETECTED   Enterococcus Faecium NOT DETECTED NOT DETECTED   Listeria monocytogenes NOT DETECTED NOT DETECTED   Staphylococcus species NOT DETECTED NOT DETECTED   Staphylococcus aureus (BCID) NOT DETECTED NOT DETECTED   Staphylococcus epidermidis NOT DETECTED NOT DETECTED   Staphylococcus lugdunensis NOT DETECTED NOT DETECTED   Streptococcus species NOT DETECTED NOT DETECTED   Streptococcus agalactiae NOT DETECTED NOT DETECTED   Streptococcus pneumoniae NOT DETECTED NOT DETECTED   Streptococcus pyogenes NOT DETECTED NOT DETECTED   A.calcoaceticus-baumannii NOT DETECTED NOT DETECTED   Bacteroides fragilis NOT DETECTED NOT DETECTED   Enterobacterales DETECTED (A) NOT DETECTED   Enterobacter cloacae complex NOT DETECTED NOT DETECTED   Escherichia coli NOT DETECTED NOT DETECTED   Klebsiella aerogenes NOT DETECTED NOT DETECTED   Klebsiella oxytoca NOT DETECTED NOT DETECTED   Klebsiella pneumoniae NOT DETECTED NOT DETECTED   Proteus species DETECTED (A) NOT DETECTED   Salmonella species NOT DETECTED NOT DETECTED   Serratia marcescens NOT DETECTED NOT DETECTED   Haemophilus influenzae NOT DETECTED NOT DETECTED   Neisseria meningitidis NOT  DETECTED NOT DETECTED   Pseudomonas aeruginosa NOT DETECTED NOT DETECTED   Stenotrophomonas maltophilia NOT DETECTED NOT DETECTED   Candida albicans NOT DETECTED NOT DETECTED   Candida auris NOT DETECTED NOT DETECTED   Candida glabrata NOT DETECTED NOT DETECTED   Candida krusei NOT DETECTED NOT DETECTED   Candida parapsilosis NOT DETECTED NOT DETECTED   Candida tropicalis NOT DETECTED NOT DETECTED   Cryptococcus neoformans/gattii NOT DETECTED NOT DETECTED   CTX-M ESBL NOT DETECTED NOT DETECTED   Carbapenem resistance IMP NOT DETECTED NOT DETECTED   Carbapenem resistance KPC NOT DETECTED NOT DETECTED   Carbapenem resistance NDM NOT DETECTED NOT DETECTED   Carbapenem resist OXA 48 LIKE NOT DETECTED NOT DETECTED   Carbapenem resistance VIM NOT DETECTED NOT DETECTED    Carron Brazen 10/30/2023  9:43 PM

## 2023-10-30 NOTE — Plan of Care (Signed)

## 2023-10-30 NOTE — Progress Notes (Signed)
 RT note. Patient transported from ED to 69M 2 on ventilator without any complications.

## 2023-10-30 NOTE — Sedation Documentation (Signed)
 7.5, breath sounds equal. 24 at the lip. Positive color change

## 2023-10-30 NOTE — Progress Notes (Signed)
 Pt transported on vent to CT and back to 3M02 without complications. RT will monitor.

## 2023-10-30 NOTE — Progress Notes (Addendum)
 Pharmacy Antibiotic Note  Carolyn Norton is a 58 y.o. female admitted on 10/30/2023 with DKA and sepsis.  Pharmacy has been consulted for Vancomycin and Zosyn dosing.   AKI - SCr 4.32 (baseline ~0.8)  Plan: Zosyn 3.375g IV x1 then 2.25g IV q8h. Vancomycin 1750mg  IV x1 then monitor renal function and dose based on levels.  Monitor renal function, culture results, and clinical status  Weight: 83.2 kg (183 lb 6.8 oz)  Temp (24hrs), Avg:96.5 F (35.8 C), Min:96.5 F (35.8 C), Max:96.5 F (35.8 C)  No results for input(s): "WBC", "CREATININE", "LATICACIDVEN", "VANCOTROUGH", "VANCOPEAK", "VANCORANDOM", "GENTTROUGH", "GENTPEAK", "GENTRANDOM", "TOBRATROUGH", "TOBRAPEAK", "TOBRARND", "AMIKACINPEAK", "AMIKACINTROU", "AMIKACIN" in the last 168 hours.  CrCl cannot be calculated (Patient's most recent lab result is older than the maximum 21 days allowed.).    Allergies  Allergen Reactions   Erythromycin Other (See Comments)    Childhood reaction    Antimicrobials this admission: Zosyn 2/22 >> Vancomycin 2/22 >>  Dose adjustments this admission:   Microbiology results: 2/22 BCx:   Thank you for allowing pharmacy to be a part of this patient's care.  Link Snuffer, PharmD, BCPS, BCCCP Please refer to [facility] for [facility] numbers 10/30/2023 7:47 AM

## 2023-10-30 NOTE — ED Provider Notes (Signed)
 [place] EMERGENCY DEPARTMENT AT [facility] Provider Note   CSN: [id number] Arrival date & time: 10/30/23  7829     History  Chief Complaint  Patient presents with   Altered Mental Status    Carolyn Norton is a 58 y.o. female.  Level 5 caveat for acuity of condition.  Patient brought in by EMS in extremis.  Found altered, hypoxic, hypotensive, vomiting.  CBG reading is high for EMS.  Initial blood pressure 70/40.  85% on room air.  Placed on nonrebreather.  Patient obtunded and writhing around the bed.  Unable give any history. Questionable coffee-ground emesis per  EMS report  Contacted patient's father by phone.  He last saw her yesterday afternoon.  Believes she has been out of insulin for the past several days.  She does have a history of endometrial cancer is not getting any treatment currently.  He saw her in the afternoon yesterday and she was acting normal.  The history is provided by the patient and the EMS personnel. The history is limited by the condition of the patient.  Altered Mental Status      Home Medications Prior to Admission medications   Medication Sig Start Date End Date Taking? Authorizing Provider  amLODipine (NORVASC) 5 MG tablet Take 1 tablet (5 mg total) by mouth daily. 04/30/21   Russella Dar, NP  atorvastatin (LIPITOR) 20 MG tablet Take 1 tablet (20 mg total) by mouth daily. Patient taking differently: Take 20 mg by mouth at bedtime. 04/29/21   Russella Dar, NP  bisacodyl (DULCOLAX) 10 MG suppository Place 1 suppository (10 mg total) rectally daily as needed for moderate constipation or severe constipation. 04/29/21   Russella Dar, NP  divalproex (DEPAKOTE) 250 MG DR tablet Take 250 mg by mouth 2 (two) times daily.    [provider]  Dulaglutide (TRULICITY) 3 MG/0.5ML SOAJ Inject 3 mg into the skin once a week. Per PCP's office    [provider]  empagliflozin (JARDIANCE) 10 MG TABS tablet Take 10 mg by  mouth daily. Per PCP's    [provider]  glimepiride (AMARYL) 2 MG tablet Take 2 mg by mouth daily with breakfast. Take with breakfast or first meal of the day. Per PCP's office    [provider]  LANTUS 100 UNIT/ML injection Inject 50 Units into the skin See admin instructions. 30 units in morning and 20 units at bedtime. Pt is taking the generic Lantus Erskine Squibb) per PCP's nurse Jan    [provider]  levalbuterol [facility] HFA) 45 MCG/ACT inhaler Inhale 2 puffs into the lungs every 4 (four) hours as needed for wheezing or shortness of breath.    [provider]  megestrol (MEGACE) 40 MG tablet Take 2 tablets (80 mg total) by mouth 2 (two) times daily. 10/04/23   Warner Mccreedy D, NP  metoprolol tartrate (LOPRESSOR) 12.5 mg TABS tablet Take 12.5 mg by mouth 2 (two) times daily.    [provider]  mirtazapine (REMERON) 7.5 MG tablet Take 15 mg by mouth at bedtime.    [provider]  Multiple Vitamins-Minerals (MULTIVITAMIN WITH MINERALS) tablet Take 1 tablet by mouth daily.    [provider]  pantoprazole (PROTONIX) 40 MG tablet Take 40 mg by mouth every morning.    [provider]  polyethylene glycol (MIRALAX / GLYCOLAX) 17 g packet Take 17 g by mouth daily. 04/30/21   Russella Dar, NP  Allergies    Erythromycin    Review of Systems   Review of Systems  Unable to perform ROS: Mental status change    Physical Exam Updated Vital Signs BP (!) 87/43   Pulse 100   Temp (!) 96.5 F (35.8 C) (Axillary)   Resp (!) 30   Wt 83.2 kg   LMP 01/26/2017 (Approximate)   SpO2 90%   BMI 31.48 kg/m  Physical Exam Constitutional:      General: She is in acute distress.     Appearance: She is ill-appearing and toxic-appearing. She is not diaphoretic.     Comments: Obtunded, writhing around the bed, not following commands, not speaking, tachypnea  HENT:     Mouth/Throat:     Mouth: Mucous membranes are dry.      Comments: Possible dried blood in mouth Cardiovascular:     Rate and Rhythm: Tachycardia present.  Pulmonary:     Effort: Respiratory distress present.     Comments: Tachypnea, clear lungs Abdominal:     Tenderness: There is no abdominal tenderness.  Musculoskeletal:        General: No swelling or tenderness. Normal range of motion.     Cervical back: Normal range of motion and neck supple.  Skin:    General: Skin is dry.  Neurological:     Comments: Obtunded moves all extremities but does not follow command     ED Results / Procedures / Treatments   Labs (all labs ordered are listed, but only abnormal results are displayed) Labs Reviewed  BASIC METABOLIC PANEL - Abnormal; Notable for the following components:      Result Value   Sodium 131 (*)    Potassium 7.1 (*)    Chloride 86 (*)    CO2 <7 (*)    Glucose, Bld >1,200 (*)    BUN 55 (*)    Creatinine, Ser 4.32 (*)    GFR, Estimated 11 (*)    All other components within normal limits  CBG MONITORING, ED - Abnormal; Notable for the following components:   Glucose-Capillary >600 (*)    All other components within normal limits  CBG MONITORING, ED - Abnormal; Notable for the following components:   Glucose-Capillary >600 (*)    All other components within normal limits  I-STAT VENOUS BLOOD GAS, ED - Abnormal; Notable for the following components:   pH, Ven 6.900 (*)    pCO2, Ven 29.5 (*)    pO2, Ven 172 (*)    Bicarbonate 5.8 (*)    TCO2 7 (*)    Acid-base deficit 26.0 (*)    Sodium 124 (*)    Potassium 7.2 (*)    Calcium, Ion 1.02 (*)    All other components within normal limits  I-STAT ARTERIAL BLOOD GAS, ED - Abnormal; Notable for the following components:   pH, Arterial 7.049 (*)    pO2, Arterial 385 (*)    Bicarbonate 9.6 (*)    TCO2 11 (*)    Acid-base deficit 20.0 (*)    Sodium 129 (*)    Potassium 6.2 (*)    Calcium, Ion 1.09 (*)    HCT 33.0 (*)    Hemoglobin 11.2 (*)    All other components  within normal limits  TROPONIN I (HIGH SENSITIVITY) - Abnormal; Notable for the following components:   Troponin I (High Sensitivity) 138 (*)    All other components within normal limits  CULTURE, BLOOD (ROUTINE X 2)  CULTURE, BLOOD (ROUTINE X 2)  RESPIRATORY PANEL BY PCR  BASIC METABOLIC PANEL  BASIC METABOLIC PANEL  BASIC METABOLIC PANEL  BASIC METABOLIC PANEL  BETA-HYDROXYBUTYRIC ACID  BETA-HYDROXYBUTYRIC ACID  BETA-HYDROXYBUTYRIC ACID  BETA-HYDROXYBUTYRIC ACID  BETA-HYDROXYBUTYRIC ACID  CBC WITH DIFFERENTIAL/PLATELET  URINALYSIS, ROUTINE W REFLEX MICROSCOPIC  CBC WITH DIFFERENTIAL/PLATELET  HIV ANTIBODY (ROUTINE TESTING W REFLEX)  PROCALCITONIN  TROPONIN I (HIGH SENSITIVITY)    EKG EKG Interpretation Date/Time:  Saturday October 30 2023 06:47:24 EST Ventricular Rate:  113 PR Interval:  144 QRS Duration:  152 QT Interval:  389 QTC Calculation: 534 R Axis:   -8  Text Interpretation: Sinus tachycardia Right bundle branch block Abnrm T, consider ischemia, anterolateral lds Rate faster Nonspecific ST abnormality Confirmed by Glynn Octave [id number]) on 10/30/2023 8:06:59 AM  Radiology DG Chest Portable 1 View Result Date: 10/30/2023 CLINICAL DATA:  Endotracheal tube placement. EXAM: PORTABLE CHEST 1 VIEW COMPARISON:  February 24, 2022. FINDINGS: Stable cardiomediastinal silhouette. Endotracheal and nasogastric tubes are noted in grossly good position. Right-sided ventriculoperitoneal shunt is noted. Minimal left basilar subsegmental atelectasis is noted with small pleural effusion. Minimal right basilar subsegmental atelectasis is noted. Bony thorax is unremarkable. IMPRESSION: Support apparatus as noted above. Minimal bibasilar subsegmental atelectasis with small left pleural effusion. Electronically Signed   By: Lupita Raider M.D.   On: 10/30/2023 08:06    Procedures .Critical Care  Performed by: Glynn Octave, MD Authorized by: Glynn Octave, MD   Critical care  provider statement:    Critical care time (minutes):  120   Critical care time was exclusive of:  Separately billable procedures and treating other patients   Critical care was necessary to treat or prevent imminent or life-threatening deterioration of the following conditions:  Sepsis, shock, metabolic crisis, endocrine crisis, dehydration and respiratory failure   Critical care was time spent personally by me on the following activities:  Development of treatment plan with patient or surrogate, discussions with consultants, evaluation of patient's response to treatment, examination of patient, ordering and review of laboratory studies, ordering and review of radiographic studies, ordering and performing treatments and interventions, pulse oximetry, re-evaluation of patient's condition, review of old charts, blood draw for specimens and obtaining history from patient or surrogate   I assumed direction of critical care for this patient from another provider in my specialty: no     Care discussed with: admitting provider   Central Line  Date/Time: 10/30/2023 7:22 AM  Performed by: Glynn Octave, MD Authorized by: Glynn Octave, MD   Consent:    Consent obtained:  Emergent situation   Consent given by:  Healthcare agent   Risks, benefits, and alternatives were discussed: yes     Risks discussed:  Arterial puncture, bleeding, infection, incorrect placement, nerve damage and pneumothorax   Alternatives discussed:  No treatment Universal protocol:    Procedure explained and questions answered to patient or proxy's satisfaction: yes     Relevant documents present and verified: yes     Test results available: yes     Imaging studies available: yes     Required blood products, implants, devices, and special equipment available: yes     Site/side marked: yes     Immediately prior to procedure, a time out was called: yes     Patient identity confirmed:  Verbally with patient Pre-procedure  details:    Indication(s): central venous access, hemodynamic monitoring and insufficient peripheral access     Hand hygiene: Hand hygiene performed prior to insertion     Sterile barrier  technique: All elements of maximal sterile technique followed     Skin preparation:  Povidone-iodine   Skin preparation agent: Skin preparation agent completely dried prior to procedure   Anesthesia:    Anesthesia method:  Local infiltration   Local anesthetic:  Lidocaine 2% WITH epi Procedure details:    Location:  L femoral and R femoral   Site selection rationale:  Atattempted left femoral with good blood return but could not feed guidewire.  Switched to right femoral.   Patient position:  Trendelenburg   Procedural supplies:  Triple lumen   Catheter size:  7 Fr   Landmarks identified: yes     Ultrasound guidance: yes     Ultrasound guidance timing: prior to insertion     Sterile ultrasound techniques: Sterile gel and sterile probe covers were used     Number of attempts:  2   Successful placement: yes   Post-procedure details:    Post-procedure:  Dressing applied and line sutured   Assessment:  Blood return through all ports and free fluid flow   Procedure completion:  Tolerated .Ultrasound ED Peripheral IV (Provider)  Date/Time: 10/30/2023 8:08 AM  Performed by: Glynn Octave, MD Authorized by: Glynn Octave, MD   Procedure details:    Indications: hypotension and multiple failed IV attempts     Skin Prep: chlorhexidine gluconate     Location: L IJ.   Angiocath:  18 G   Bedside Ultrasound Guided: Yes     Images: not archived     Patient tolerated procedure without complications: Yes     Dressing applied: Yes       Medications Ordered in ED Medications  EPINEPHrine (ADRENALIN) 1 MG/10ML injection (0.5 mg Intravenous Given 10/30/23 0603)  etomidate (AMIDATE) injection (20 mg Intravenous Given 10/30/23 0605)  rocuronium (ZEMURON) injection (100 mg Intravenous Given 10/30/23 0605)   fentaNYL 10 mcg/ml infusion (has no administration in time range)  insulin regular, human (MYXREDLIN) 100 units/ 100 mL infusion (has no administration in time range)  dextrose 50 % solution 0-50 mL (has no administration in time range)  lactated ringers bolus 1,000 mL (1,000 mLs Intravenous Not Given 10/30/23 0617)  lactated ringers infusion (has no administration in time range)  dextrose 5 % in lactated ringers infusion (has no administration in time range)  propofol (DIPRIVAN) 1000 MG/100ML infusion (10 mcg/kg/min  83.2 kg Intravenous Infusion Verify 10/30/23 0645)  sodium bicarbonate injection 100 mEq (has no administration in time range)  calcium chloride 2 g in sodium chloride 0.9 % 100 mL IVPB (has no administration in time range)  lactated ringers bolus 1,000 mL (0 mLs Intravenous Stopped 10/30/23 0658)  lactated ringers bolus 1,000 mL (1,000 mLs Intravenous New Bag/Given 10/30/23 0556)  norepinephrine (LEVOPHED) 4-5 MG/250ML-% infusion SOLN (  Rate/Dose Change 10/30/23 0701)  lactated ringers bolus 1,000 mL (0 mLs Intravenous Stopped 10/30/23 1324)    ED Course/ Medical Decision Making/ A&P                                 Medical Decision Making Amount and/or Complexity of Data Reviewed Labs: ordered. Decision-making details documented in ED Course. Radiology: ordered and independent interpretation performed. Decision-making details documented in ED Course. ECG/medicine tests: ordered and independent interpretation performed. Decision-making details documented in ED Course.  Risk Prescription drug management. Decision regarding hospitalization.   Patient is an extremis with altered mental status, hypoxia, hypotension, hyperglycemia.  Unable to give  any history.  IV access established, IV fluids begun.  Concern for DKA.  Given her hypotension.  Peripheral Neo-Synephrine was given prior to intubation to improve blood pressure. Intubation performed by Cecilio Asper under my  direct supervision.  Workup consistent with DKA, pH 6.8, bicarb 7, potassium 7.5, EKG is sinus tachycardia without widened QRS complex. AKI.   Patient given aggressive IV hydration, IV insulin, IV calcium, bicarb pushes.  Hypotension persisting.  She is given additional IV fluid.  she has had 3 L of IV fluids.  Will continue Levophed. Prophylactic antibiotics given with concern for possible underlying sepsis.  Consider GI bleed as well although her hemoglobin is stable.  ICU admission discussed with Dr. Katrinka Blazing.       Final Clinical Impression(s) / ED Diagnoses Final diagnoses:  Diabetic ketoacidosis with coma associated with type 1 diabetes mellitus (HCC)  Acute respiratory failure with hypoxia [facility])    Rx / DC Orders ED Discharge Orders     None         Ahmar Pickrell, Jeannett Senior, MD 10/30/23 [phone]

## 2023-10-30 NOTE — H&P (Addendum)
 NAME:  Carolyn Norton, MRN:  [id number], DOB:  04-Jun-1966, LOS: 0 ADMISSION DATE:  10/30/2023, CONSULTATION DATE:  10/30/23 REFERRING MD:  EDP, CHIEF COMPLAINT:  found down   History of Present Illness:  Patient found down at home in vomit. Found to be in shock state with DKA. PCCM consulted for admit.  Patient is currently intubated and poorly responsive.  Per father, ran out of insulin first of this week. Could not find pharmacy that carried it.  Pertinent  Medical History  Hx includes DM, FIGO endometroid endometrial cancer low grade, NPH s/p VP shunt, DM2, bipolar  Significant Hospital Events: Including procedures, antibiotic start and stop dates in addition to other pertinent events   10/30/23 admit  Interim History / Subjective:  Admit  Objective   Blood pressure (!) 85/48, pulse 100, temperature (!) 96.5 F (35.8 C), temperature source Axillary, resp. rate (!) 30, weight 83.2 kg, last menstrual period 01/26/2017, SpO2 90%.    Vent Mode: PRVC FiO2 (%):  [100 %] 100 % Set Rate:  [30 bmp] 30 bmp Vt Set:  [440 mL] 440 mL PEEP:  [5 cmH20] 5 cmH20 Plateau Pressure:  [17 cmH20] 17 cmH20   Intake/Output Summary (Last 24 hours) at 10/30/2023 4098 Last data filed at 10/30/2023 1191 Gross per 24 hour  Intake 3032.66 ml  Output --  Net 3032.66 ml   Filed Weights   10/30/23 0612  Weight: 83.2 kg    Examination: General: ill appearing on vent HENT: MM dry, ETT in place Lungs: clear, no wheezing, passive on vent Cardiovascular: tachy, ext lukewarm Abdomen: abd soft hypoactive BS Extremities:  Neuro: gcs3 but pupils reactive GU: foley to be placed  Labs pending  Resolved Hospital Problem list   N/A  Assessment & Plan:  Metabolic encephalopathy Inability to protect airway s/p intubation R/o CNS event R/o sepsis R/o ACS NPH s/p VP shunt Localized endometrial cancer HTN HLD Poorly controlled DM2 with DKA  - CT head - Endotool - f/u trops, echo, Pct,  cbc - temporizing measures for hyper-K, should improve with insulin - maximize minute ventilation as we can on vent - sedation PRN  Best Practice (right click and "Reselect all SmartList Selections" daily)   Diet/type: NPO DVT prophylaxis other pending head CT Pressure ulcer(s): pressure ulcer assessment deferred  GI prophylaxis: PPI Lines: Central line Foley:  Yes, and it is still needed Code Status:  modified DNR Last date of multidisciplinary goals of care discussion [updated father at bedside] Sister and father are POA.  She needs a SNF.  Labs   CBC: Recent Labs  Lab 10/30/23 0620  HGB 13.9  HCT 41.0    Basic Metabolic Panel: Recent Labs  Lab 10/30/23 0620  NA 124*  K 7.2*   GFR: CrCl cannot be calculated (Patient's most recent lab result is older than the maximum 21 days allowed.). No results for input(s): "PROCALCITON", "WBC", "LATICACIDVEN" in the last 168 hours.  Liver Function Tests: No results for input(s): "AST", "ALT", "ALKPHOS", "BILITOT", "PROT", "ALBUMIN" in the last 168 hours. No results for input(s): "LIPASE", "AMYLASE" in the last 168 hours. No results for input(s): "AMMONIA" in the last 168 hours.  ABG    Component Value Date/Time   PHART 7.21 (L) 02/24/2022 1215   PCO2ART 34 02/24/2022 1215   PO2ART <31 (LL) 02/24/2022 1215   HCO3 5.8 (L) 10/30/2023 0620   TCO2 7 (L) 10/30/2023 0620   ACIDBASEDEF 26.0 (H) 10/30/2023 0620   O2SAT 98 10/30/2023 4782  Coagulation Profile: No results for input(s): "INR", "PROTIME" in the last 168 hours.  Cardiac Enzymes: No results for input(s): "CKTOTAL", "CKMB", "CKMBINDEX", "TROPONINI" in the last 168 hours.  HbA1C: Hgb A1c MFr Bld  Date/Time Value Ref Range Status  09/06/2023 11:54 AM 12.3 (H) 4.8 - 5.6 % Final    Comment:    (NOTE)         Prediabetes: 5.7 - 6.4         Diabetes: >6.4         Glycemic control for adults with diabetes: <7.0   02/24/2022 06:52 PM 8.9 (H) 4.8 - 5.6 % Final     Comment:    (NOTE) Pre diabetes:          5.7%-6.4%  Diabetes:              >6.4%  Glycemic control for   <7.0% adults with diabetes     CBG: Recent Labs  Lab 10/30/23 0550 10/30/23 0731  GLUCAP >600* >600*    Review of Systems:   comatose  Past Medical History:  She,  has a past medical history of Alzheimer disease (HCC), Anxiety, Asthma, Bipolar disorder (HCC), Cancer (HCC), Chronic kidney disease, COVID, Depression, Diabetes mellitus without complication (HCC), Dyspnea, Hyperlipidemia, Hypertension, Normal pressure hydrocephalus (HCC), and Seasonal allergies.   Surgical History:   Past Surgical History:  Procedure Laterality Date   BREAST EXCISIONAL BIOPSY Right    BREAST SURGERY     right breast   CATARACT EXTRACTION W/PHACO Left 02/12/2023   Procedure: CATARACT EXTRACTION PHACO AND INTRAOCULAR LENS PLACEMENT (IOC);  Surgeon: Fabio Pierce, MD;  Location: [facility];  Service: Ophthalmology;  Laterality: Left;  CDE: 22.05   CATARACT EXTRACTION W/PHACO Right 02/26/2023   Procedure: CATARACT EXTRACTION PHACO AND INTRAOCULAR LENS PLACEMENT (IOC);  Surgeon: Fabio Pierce, MD;  Location: [facility] OR[facility];  Service: Ophthalmology;  Laterality: Right;  CDE: 24.40   TOOTH EXTRACTION N/A 12/11/2022   Procedure: DENTAL RESTORATION/EXTRACTIONS;  Surgeon: Ocie Doyne, DMD;  Location: [facility] OR;  Service: Oral Surgery;  Laterality: N/A;   VENTRICULOPERITONEAL SHUNT N/A 05/29/2022   Procedure: Ventriculoperitoneal shunt placement;  Surgeon: Donalee Citrin, MD;  Location[facility] OR;  Service: Neurosurgery;  Laterality: N/A;     Social History:   reports that she has been smoking cigarettes. She started smoking about 9 months ago. She has a 0.4 pack-year smoking history. She has never used smokeless tobacco. She reports that she does not currently use drugs after having used the following drugs: Cocaine. She reports that she does not drink alcohol.   Family History:  Her family history includes Breast cancer  in her paternal grandmother; Hypertension in her mother; Kidney cancer in her mother; Leukemia in her brother; Prostate cancer in her father. There is no history of Endometrial cancer, Colon cancer, or Ovarian cancer.   Allergies Allergies  Allergen Reactions   Erythromycin Other (See Comments)    Childhood reaction     Home Medications  Prior to Admission medications   Medication Sig Start Date End Date Taking? Authorizing Provider  amLODipine (NORVASC) 5 MG tablet Take 1 tablet (5 mg total) by mouth daily. 04/30/21   Russella Dar, NP  atorvastatin (LIPITOR) 20 MG tablet Take 1 tablet (20 mg total) by mouth daily. Patient taking differently: Take 20 mg by mouth at bedtime. 04/29/21   Russella Dar, NP  bisacodyl (DULCOLAX) 10 MG suppository Place 1 suppository (10 mg total) rectally daily as needed for moderate constipation or  severe constipation. 04/29/21   Russella Dar, NP  divalproex (DEPAKOTE) 250 MG DR tablet Take 250 mg by mouth 2 (two) times daily.    [provider]  Dulaglutide (TRULICITY) 3 MG/0.5ML SOAJ Inject 3 mg into the skin once a week. Per PCP's office    [provider]  empagliflozin (JARDIANCE) 10 MG TABS tablet Take 10 mg by mouth daily. Per PCP's    [provider]  glimepiride (AMARYL) 2 MG tablet Take 2 mg by mouth daily with breakfast. Take with breakfast or first meal of the day. Per PCP's office    [provider]  LANTUS 100 UNIT/ML injection Inject 50 Units into the skin See admin instructions. 30 units in morning and 20 units at bedtime. Pt is taking the generic Lantus Erskine Squibb) per PCP's nurse Jan    [provider]  levalbuterol [facility] HFA) 45 MCG/ACT inhaler Inhale 2 puffs into the lungs every 4 (four) hours as needed for wheezing or shortness of breath.    [provider]  megestrol (MEGACE) 40 MG tablet Take 2 tablets (80 mg total) by mouth 2 (two) times daily. 10/04/23   Warner Mccreedy D,  NP  metoprolol tartrate (LOPRESSOR) 12.5 mg TABS tablet Take 12.5 mg by mouth 2 (two) times daily.    [provider]  mirtazapine (REMERON) 7.5 MG tablet Take 15 mg by mouth at bedtime.    [provider]  Multiple Vitamins-Minerals (MULTIVITAMIN WITH MINERALS) tablet Take 1 tablet by mouth daily.    [provider]  pantoprazole (PROTONIX) 40 MG tablet Take 40 mg by mouth every morning.    [provider]  polyethylene glycol (MIRALAX / GLYCOLAX) 17 g packet Take 17 g by mouth daily. 04/30/21   Russella Dar, NP     Critical care time: 33 mins cc time

## 2023-10-30 NOTE — ED Provider Notes (Signed)
  [place] EMERGENCY DEPARTMENT AT [facility] Provider Note      Procedures Procedure Name: Intubation Date/Time: 10/30/2023 6:10 AM  Performed by: Roxy Horseman, PA-CPre-anesthesia Checklist: Patient identified, Patient being monitored, Emergency Drugs available, Timeout performed and Suction available Oxygen Delivery Method: Non-rebreather mask Preoxygenation: Pre-oxygenation with 100% oxygen Induction Type: Rapid sequence Ventilation: Mask ventilation without difficulty Laryngoscope Size: Glidescope Tube size: 7.5 mm Number of attempts: 1 Airway Equipment and Method: Video-laryngoscopy Placement Confirmation: ETT inserted through vocal cords under direct vision, CO2 detector and Breath sounds checked- equal and bilateral Secured at: 27 cm Tube secured with: ETT holder Dental Injury: Teeth and Oropharynx as per pre-operative assessment          Roxy Horseman, PA-C 10/30/23 9528    Glynn Octave, MD 10/30/23 [phone]

## 2023-10-30 NOTE — ED Triage Notes (Signed)
 Patient BIB [facility] from home due to AMS since this morning. Patient found with vomit on self, floor, and bed. Hx uterine cancer and diabetes. EMS CBG reading high. EMS reports kussmal respirations. Initial BP 70/40. 500 mL NS given via IO. 85% on room air, 92 NRB. Patient not oriented.

## 2023-10-30 NOTE — Progress Notes (Signed)
  Echocardiogram 2D Echocardiogram has been performed.  Carolyn Norton 10/30/2023, 3:35 PM

## 2023-10-31 DIAGNOSIS — N179 Acute kidney failure, unspecified: Secondary | ICD-10-CM | POA: Diagnosis not present

## 2023-10-31 DIAGNOSIS — J9601 Acute respiratory failure with hypoxia: Secondary | ICD-10-CM | POA: Diagnosis not present

## 2023-10-31 DIAGNOSIS — A419 Sepsis, unspecified organism: Secondary | ICD-10-CM | POA: Diagnosis not present

## 2023-10-31 DIAGNOSIS — E1110 Type 2 diabetes mellitus with ketoacidosis without coma: Secondary | ICD-10-CM | POA: Diagnosis not present

## 2023-10-31 LAB — BLOOD CULTURE ID PANEL (REFLEXED) - BCID2
A.calcoaceticus-baumannii: NOT DETECTED
Bacteroides fragilis: NOT DETECTED
CTX-M ESBL: NOT DETECTED
Candida albicans: NOT DETECTED
Candida auris: NOT DETECTED
Candida glabrata: NOT DETECTED
Candida krusei: NOT DETECTED
Candida parapsilosis: NOT DETECTED
Candida tropicalis: NOT DETECTED
Carbapenem resist OXA 48 LIKE: NOT DETECTED
Carbapenem resistance IMP: NOT DETECTED
Carbapenem resistance KPC: NOT DETECTED
Carbapenem resistance NDM: NOT DETECTED
Carbapenem resistance VIM: NOT DETECTED
Cryptococcus neoformans/gattii: NOT DETECTED
Enterobacter cloacae complex: NOT DETECTED
Enterobacterales: DETECTED — AB
Enterococcus Faecium: NOT DETECTED
Enterococcus faecalis: NOT DETECTED
Escherichia coli: NOT DETECTED
Haemophilus influenzae: NOT DETECTED
Klebsiella aerogenes: NOT DETECTED
Klebsiella oxytoca: NOT DETECTED
Klebsiella pneumoniae: NOT DETECTED
Listeria monocytogenes: NOT DETECTED
Methicillin resistance mecA/C: DETECTED — AB
Neisseria meningitidis: NOT DETECTED
Proteus species: DETECTED — AB
Pseudomonas aeruginosa: NOT DETECTED
Salmonella species: NOT DETECTED
Serratia marcescens: NOT DETECTED
Staphylococcus aureus (BCID): NOT DETECTED
Staphylococcus epidermidis: DETECTED — AB
Staphylococcus lugdunensis: NOT DETECTED
Staphylococcus species: DETECTED — AB
Stenotrophomonas maltophilia: NOT DETECTED
Streptococcus agalactiae: NOT DETECTED
Streptococcus pneumoniae: NOT DETECTED
Streptococcus pyogenes: NOT DETECTED
Streptococcus species: NOT DETECTED

## 2023-10-31 LAB — BASIC METABOLIC PANEL
Anion gap: 14 (ref 5–15)
BUN: 44 mg/dL — ABNORMAL HIGH (ref 6–20)
CO2: 24 mmol/L (ref 22–32)
Calcium: 8.9 mg/dL (ref 8.9–10.3)
Chloride: 101 mmol/L (ref 98–111)
Creatinine, Ser: 3.15 mg/dL — ABNORMAL HIGH (ref 0.44–1.00)
GFR, Estimated: 17 mL/min — ABNORMAL LOW (ref >60)
Glucose, Bld: 179 mg/dL — ABNORMAL HIGH (ref 70–99)
Potassium: 4 mmol/L (ref 3.5–5.1)
Sodium: 139 mmol/L (ref 135–145)

## 2023-10-31 LAB — CBC
HCT: 34.6 % — ABNORMAL LOW (ref 36.0–46.0)
Hemoglobin: 11.9 g/dL — ABNORMAL LOW (ref 12.0–15.0)
MCH: 28.1 pg (ref 26.0–34.0)
MCHC: 34.4 g/dL (ref 30.0–36.0)
MCV: 81.6 fL (ref 80.0–100.0)
Platelets: 295 10*3/uL (ref 150–400)
RBC: 4.24 MIL/uL (ref 3.87–5.11)
RDW: 13.7 % (ref 11.5–15.5)
WBC: 21.3 10*3/uL — ABNORMAL HIGH (ref 4.0–10.5)
nRBC: 0 % (ref 0.0–0.2)

## 2023-10-31 LAB — MAGNESIUM: Magnesium: 1.7 mg/dL (ref 1.7–2.4)

## 2023-10-31 LAB — PHOSPHORUS: Phosphorus: 3.3 mg/dL (ref 2.5–4.6)

## 2023-10-31 LAB — GLUCOSE, CAPILLARY
Glucose-Capillary: 130 mg/dL — ABNORMAL HIGH (ref 70–99)
Glucose-Capillary: 138 mg/dL — ABNORMAL HIGH (ref 70–99)
Glucose-Capillary: 152 mg/dL — ABNORMAL HIGH (ref 70–99)
Glucose-Capillary: 152 mg/dL — ABNORMAL HIGH (ref 70–99)
Glucose-Capillary: 157 mg/dL — ABNORMAL HIGH (ref 70–99)
Glucose-Capillary: 160 mg/dL — ABNORMAL HIGH (ref 70–99)
Glucose-Capillary: 162 mg/dL — ABNORMAL HIGH (ref 70–99)
Glucose-Capillary: 176 mg/dL — ABNORMAL HIGH (ref 70–99)
Glucose-Capillary: 179 mg/dL — ABNORMAL HIGH (ref 70–99)
Glucose-Capillary: 185 mg/dL — ABNORMAL HIGH (ref 70–99)
Glucose-Capillary: 190 mg/dL — ABNORMAL HIGH (ref 70–99)
Glucose-Capillary: 208 mg/dL — ABNORMAL HIGH (ref 70–99)
Glucose-Capillary: 209 mg/dL — ABNORMAL HIGH (ref 70–99)
Glucose-Capillary: 215 mg/dL — ABNORMAL HIGH (ref 70–99)
Glucose-Capillary: 247 mg/dL — ABNORMAL HIGH (ref 70–99)

## 2023-10-31 MED ORDER — INSULIN ASPART 100 UNIT/ML IJ SOLN
0.0000 [IU] | INTRAMUSCULAR | Status: DC
  Administered 2023-10-31: 3 [IU] via SUBCUTANEOUS
  Administered 2023-10-31 (×2): 4 [IU] via SUBCUTANEOUS

## 2023-10-31 MED ORDER — ATORVASTATIN CALCIUM 40 MG PO TABS: 40.0000 mg | ORAL_TABLET | Freq: Every day | ORAL | Status: DC

## 2023-10-31 MED ORDER — SODIUM CHLORIDE 0.9 % IV SOLN
2.0000 g | INTRAVENOUS | Status: DC
  Administered 2023-10-31 – 2023-11-03 (×4): 2 g via INTRAVENOUS
  Filled 2023-10-31 (×4): qty 20

## 2023-10-31 MED ORDER — DEXTROSE IN LACTATED RINGERS 5 % IV SOLN: INTRAVENOUS | Status: AC

## 2023-10-31 MED ORDER — MAGNESIUM SULFATE 2 GM/50ML IV SOLN
2.0000 g | Freq: Once | INTRAVENOUS | Status: AC
  Administered 2023-10-31: 2 g via INTRAVENOUS
  Filled 2023-10-31: qty 50

## 2023-10-31 MED ORDER — INSULIN GLARGINE 100 UNIT/ML ~~LOC~~ SOLN
25.0000 [IU] | Freq: Two times a day (BID) | SUBCUTANEOUS | Status: DC
  Administered 2023-10-31 – 2023-11-03 (×8): 25 [IU] via SUBCUTANEOUS
  Filled 2023-10-31 (×13): qty 0.25

## 2023-10-31 NOTE — Procedures (Signed)
 Extubation Procedure Note  Patient Details:   Name: Carolyn Norton DOB: [birth year] MRN: [id number]   Airway Documentation:    Vent end date: 10/31/23 Vent end time: 1115   Evaluation  O2 sats: stable throughout Complications: No apparent complications Patient did tolerate procedure well. Bilateral Breath Sounds: Clear, Diminished   Yes   Pt extubated to 4l [place] with RN at bedside. Positive cuff leak noted and vitals stable. RT will monitor.  Lajuan Lines 10/31/2023, 11:33 AM

## 2023-10-31 NOTE — Progress Notes (Addendum)
 NAME:  Carolyn Norton, MRN:  [id number], DOB:  [birth year], LOS: 1 ADMISSION DATE:  10/30/2023, CONSULTATION DATE:  10/30/23 REFERRING MD:  EDP, CHIEF COMPLAINT:  found down   History of Present Illness:  Patient found down at home in vomit. Found to be in shock state with DKA. PCCM consulted for admit.  Patient is currently intubated and poorly responsive.  Per father, ran out of insulin first of this week. Could not find pharmacy that carried it.  Pertinent  Medical History  Hx includes DM, FIGO endometroid endometrial cancer low grade, NPH s/p VP shunt, DM2, bipolar  Significant Hospital Events: Including procedures, antibiotic start and stop dates in addition to other pertinent events   10/30/23 admit  Interim History / Subjective:  Patient blood culture growing staph epi and Proteus Vasopressor requirement is coming down Anion gap has closed  Objective   Blood pressure 135/67, pulse 82, temperature 100 F (37.8 C), resp. rate (!) 24, height 5\' 4"  (1.626 m), weight 87.2 kg, last menstrual period 01/26/2017, SpO2 100%.    Vent Mode: PSV;CPAP FiO2 (%):  [40 %] 40 % Set Rate:  [30 bmp] 30 bmp Vt Set:  [440 mL] 440 mL PEEP:  [5 cmH20] 5 cmH20 Pressure Support:  [5 cmH20] 5 cmH20 Plateau Pressure:  [20 cmH20] 20 cmH20   Intake/Output Summary (Last 24 hours) at 10/31/2023 1001 Last data filed at 10/31/2023 0900 Gross per 24 hour  Intake 6946.58 ml  Output 2275 ml  Net 4671.58 ml   Filed Weights   10/30/23 0612 10/31/23 0205  Weight: 83.2 kg 87.2 kg    Examination: General: Crtitically ill-appearing obese female, orally intubated HEENT: [place]/AT, eyes anicteric.  ETT and OGT in place Neuro: Opens eyes with vocal stimuli, following simple commands Chest: Bilateral fine crackles at bases, no wheezes or rhonchi Heart: Regular rate and rhythm, no murmurs or gallops Abdomen: Soft, nondistended, bowel sounds present Skin: No rash  Labs and images reviewed  Resolved  Hospital Problem list   Hyperkalemia  Assessment & Plan:  Poorly controlled diabetes with hyperglycemia Diabetic ketoacidosis  Severe sepsis with septic shock due to Proteus/staph epi bacteremia, POA Acute kidney injury due to septic ATN Mixed metabolic/septic encephalopathy Demand cardiac ischemia Obesity Hyperlipidemia Hypertension Depression Acute respiratory insufficiency  Anion gap is closed Will give long-acting insulin and will stop insulin infusion in 2 hours Patient hemoglobin A1c is more than 12 She needs close follow-up with PCP/endocrine Still requiring vasopressor support, though requirement has come down significantly Vasopressin was stopped this morning Continue Levophed, maintain MAP goal 65 Blood culture is growing Proteus and staph epi bacteremia Switch Zosyn to ceftriaxone Continue vancomycin for now Serum creatinine started improving, still elevated Avoid nephrotoxic agent Continue aggressive IV fluid resuscitation Decrease sedation, currently on propofol at 5 Patient serum troponins are slightly elevated, echocardiogram showed no wall motion abnormalities Resume atorvastatin Holding antihypertensive meds considering patient is in shock Holding antidepressant meds currently due to encephalopathy Patient is tolerating spontaneous breathing trial, will try to extubate her   Best Practice (right click and "Reselect all SmartList Selections" daily)   Diet/type: NPO DVT prophylaxis subcu heparin Pressure ulcer(s): pressure ulcer assessment deferred  GI prophylaxis: PPI Lines: Central line, yes still needed Foley:  Yes, and it is still needed Code Status:  modified DNR Last date of multidisciplinary goals of care discussion [updated father at bedside] Sister and father are POA.  She needs a SNF.  Labs   CBC: Recent Labs  Lab  10/30/23 0620 10/30/23 0746 10/30/23 1048 10/30/23 1814 10/31/23 0410  WBC  --   --  27.6*  --  21.3*  NEUTROABS  --   --   23.2*  --   --   HGB 13.9 11.2* 11.6* 11.9* 11.9*  HCT 41.0 33.0* 38.2 35.0* 34.6*  MCV  --   --  94.1  --  81.6  PLT  --   --  366  --  295    Basic Metabolic Panel: Recent Labs  Lab 10/30/23 1048 10/30/23 1414 10/30/23 1809 10/30/23 1814 10/30/23 2254 10/31/23 0410  NA 135 135 137 135 141 139  K 5.2* 4.1 3.6 3.5 3.5 4.0  CL 92* 95* 96*  --  101 101  CO2 9* 14* 23  --  25 24  GLUCOSE 1,062* 882* 593*  --  259* 179*  BUN 51* 53* 52*  --  48* 44*  CREATININE 4.23* 4.31* 4.00*  --  3.71* 3.15*  CALCIUM 9.7 9.3 9.2  --  9.1 8.9  MG  --   --   --   --   --  1.7  PHOS  --   --   --   --   --  3.3   GFR: Estimated Creatinine Clearance: 21.3 mL/min (A) (by C-G formula based on SCr of 3.15 mg/dL (H)). Recent Labs  Lab 10/30/23 1048 10/30/23 1244 10/31/23 0410  PROCALCITON 12.37  --   --   WBC 27.6*  --  21.3*  LATICACIDVEN  --  4.0*  --     Liver Function Tests: No results for input(s): "AST", "ALT", "ALKPHOS", "BILITOT", "PROT", "ALBUMIN" in the last 168 hours. No results for input(s): "LIPASE", "AMYLASE" in the last 168 hours. No results for input(s): "AMMONIA" in the last 168 hours.  ABG    Component Value Date/Time   PHART 7.454 (H) 10/30/2023 1814   PCO2ART 33.2 10/30/2023 1814   PO2ART 89 10/30/2023 1814   HCO3 23.3 10/30/2023 1814   TCO2 24 10/30/2023 1814   ACIDBASEDEF 20.0 (H) 10/30/2023 0746   O2SAT 97 10/30/2023 1814     Coagulation Profile: No results for input(s): "INR", "PROTIME" in the last 168 hours.  Cardiac Enzymes: No results for input(s): "CKTOTAL", "CKMB", "CKMBINDEX", "TROPONINI" in the last 168 hours.  HbA1C: Hgb A1c MFr Bld  Date/Time Value Ref Range Status  09/06/2023 11:54 AM 12.3 (H) 4.8 - 5.6 % Final    Comment:    (NOTE)         Prediabetes: 5.7 - 6.4         Diabetes: >6.4         Glycemic control for adults with diabetes: <7.0   02/24/2022 06:52 PM 8.9 (H) 4.8 - 5.6 % Final    Comment:    (NOTE) Pre diabetes:           5.7%-6.4%  Diabetes:              >6.4%  Glycemic control for   <7.0% adults with diabetes     CBG: Recent Labs  Lab 10/31/23 0506 10/31/23 0603 10/31/23 0701 10/31/23 0805 10/31/23 0941  GLUCAP 160* 190* 209* 138* 176*    The patient is critically ill due to severe sepsis septic shock due to bacteremia/DKA.  Critical care was necessary to treat or prevent imminent or life-threatening deterioration.  Critical care was time spent personally by me on the following activities: development of treatment plan with patient and/or surrogate as well  as nursing, discussions with consultants, evaluation of patient's response to treatment, examination of patient, obtaining history from patient or surrogate, ordering and performing treatments and interventions, ordering and review of laboratory studies, ordering and review of radiographic studies, pulse oximetry, re-evaluation of patient's condition and participation in multidisciplinary rounds.   During this encounter critical care time was devoted to patient care services described in this note for 39 minutes.     Cheri Fowler, MD [facility] See Amion for pager If no response to pager, please call [phone] until 7pm After 7pm, Please call E-link [phone]

## 2023-11-01 DIAGNOSIS — A419 Sepsis, unspecified organism: Secondary | ICD-10-CM | POA: Diagnosis not present

## 2023-11-01 DIAGNOSIS — N179 Acute kidney failure, unspecified: Secondary | ICD-10-CM | POA: Diagnosis not present

## 2023-11-01 DIAGNOSIS — E1011 Type 1 diabetes mellitus with ketoacidosis with coma: Secondary | ICD-10-CM

## 2023-11-01 DIAGNOSIS — G9341 Metabolic encephalopathy: Secondary | ICD-10-CM | POA: Diagnosis not present

## 2023-11-01 LAB — CBC
HCT: 33.1 % — ABNORMAL LOW (ref 36.0–46.0)
Hemoglobin: 10.9 g/dL — ABNORMAL LOW (ref 12.0–15.0)
MCH: 27.8 pg (ref 26.0–34.0)
MCHC: 32.9 g/dL (ref 30.0–36.0)
MCV: 84.4 fL (ref 80.0–100.0)
Platelets: 184 10*3/uL (ref 150–400)
RBC: 3.92 MIL/uL (ref 3.87–5.11)
RDW: 14.6 % (ref 11.5–15.5)
WBC: 14.4 10*3/uL — ABNORMAL HIGH (ref 4.0–10.5)
nRBC: 0 % (ref 0.0–0.2)

## 2023-11-01 LAB — BASIC METABOLIC PANEL
Anion gap: 9 (ref 5–15)
BUN: 27 mg/dL — ABNORMAL HIGH (ref 6–20)
CO2: 26 mmol/L (ref 22–32)
Calcium: 8.5 mg/dL — ABNORMAL LOW (ref 8.9–10.3)
Chloride: 108 mmol/L (ref 98–111)
Creatinine, Ser: 2.09 mg/dL — ABNORMAL HIGH (ref 0.44–1.00)
GFR, Estimated: 27 mL/min — ABNORMAL LOW (ref >60)
Glucose, Bld: 151 mg/dL — ABNORMAL HIGH (ref 70–99)
Potassium: 3.2 mmol/L — ABNORMAL LOW (ref 3.5–5.1)
Sodium: 143 mmol/L (ref 135–145)

## 2023-11-01 LAB — GLUCOSE, CAPILLARY
Glucose-Capillary: 59 mg/dL — ABNORMAL LOW (ref 70–99)
Glucose-Capillary: 65 mg/dL — ABNORMAL LOW (ref 70–99)
Glucose-Capillary: 172 mg/dL — ABNORMAL HIGH (ref 70–99)
Glucose-Capillary: 102 mg/dL — ABNORMAL HIGH (ref 70–99)
Glucose-Capillary: 118 mg/dL — ABNORMAL HIGH (ref 70–99)
Glucose-Capillary: 235 mg/dL — ABNORMAL HIGH (ref 70–99)
Glucose-Capillary: 230 mg/dL — ABNORMAL HIGH (ref 70–99)

## 2023-11-01 LAB — PHOSPHORUS: Phosphorus: 2.2 mg/dL — ABNORMAL LOW (ref 2.5–4.6)

## 2023-11-01 LAB — MAGNESIUM: Magnesium: 2.1 mg/dL (ref 1.7–2.4)

## 2023-11-01 MED ORDER — DEXTROSE 50 % IV SOLN
INTRAVENOUS | Status: AC
  Administered 2023-11-01: 50 mL
  Filled 2023-11-01: qty 50

## 2023-11-01 MED ORDER — POTASSIUM PHOSPHATES 15 MMOLE/5ML IV SOLN
15.0000 mmol | Freq: Once | INTRAVENOUS | Status: AC
  Administered 2023-11-01: 15 mmol via INTRAVENOUS
  Filled 2023-11-01: qty 5

## 2023-11-01 MED ORDER — INSULIN ASPART 100 UNIT/ML IJ SOLN
0.0000 [IU] | Freq: Three times a day (TID) | INTRAMUSCULAR | Status: DC
  Administered 2023-11-01: 7 [IU] via SUBCUTANEOUS
  Administered 2023-11-02: 4 [IU] via SUBCUTANEOUS
  Administered 2023-11-02: 3 [IU] via SUBCUTANEOUS
  Administered 2023-11-02: 4 [IU] via SUBCUTANEOUS
  Administered 2023-11-03 (×2): 3 [IU] via SUBCUTANEOUS
  Administered 2023-11-04 (×2): 11 [IU] via SUBCUTANEOUS
  Administered 2023-11-05: 3 [IU] via SUBCUTANEOUS

## 2023-11-01 MED ORDER — TRAZODONE HCL 50 MG PO TABS: 50.0000 mg | ORAL_TABLET | Freq: Every evening | ORAL | Status: DC | PRN

## 2023-11-01 MED ORDER — PREGABALIN 75 MG PO CAPS: 75.0000 mg | ORAL_CAPSULE | Freq: Two times a day (BID) | ORAL | Status: DC | PRN

## 2023-11-01 MED ORDER — ACETAMINOPHEN 325 MG PO TABS
650.0000 mg | ORAL_TABLET | Freq: Four times a day (QID) | ORAL | Status: DC | PRN
  Administered 2023-11-02: 650 mg via ORAL
  Filled 2023-11-01: qty 2

## 2023-11-01 MED ORDER — ORAL CARE MOUTH RINSE
15.0000 mL | OROMUCOSAL | Status: DC
  Administered 2023-11-01 – 2023-11-05 (×16): 15 mL via OROMUCOSAL

## 2023-11-01 MED ORDER — ATORVASTATIN CALCIUM 40 MG PO TABS
40.0000 mg | ORAL_TABLET | Freq: Every day | ORAL | Status: DC
  Administered 2023-11-01 – 2023-11-04 (×4): 40 mg via ORAL
  Filled 2023-11-01 (×4): qty 1

## 2023-11-01 MED ORDER — POTASSIUM CHLORIDE 20 MEQ PO PACK
40.0000 meq | PACK | Freq: Once | ORAL | Status: AC
  Administered 2023-11-01: 40 meq via ORAL
  Filled 2023-11-01: qty 2

## 2023-11-01 MED ORDER — INSULIN ASPART 100 UNIT/ML IJ SOLN
0.0000 [IU] | Freq: Every day | INTRAMUSCULAR | Status: DC
  Administered 2023-11-01: 2 [IU] via SUBCUTANEOUS
  Administered 2023-11-03: 3 [IU] via SUBCUTANEOUS
  Administered 2023-11-04: 2 [IU] via SUBCUTANEOUS

## 2023-11-01 MED ORDER — PANTOPRAZOLE SODIUM 40 MG PO TBEC
40.0000 mg | DELAYED_RELEASE_TABLET | Freq: Every day | ORAL | Status: DC
  Administered 2023-11-01 – 2023-11-04 (×4): 40 mg via ORAL
  Filled 2023-11-01 (×4): qty 1

## 2023-11-01 MED ORDER — VALPROIC ACID 250 MG/5ML PO SOLN
250.0000 mg | Freq: Three times a day (TID) | ORAL | Status: DC
  Administered 2023-11-01: 250 mg via ORAL
  Filled 2023-11-01: qty 5

## 2023-11-01 MED ORDER — DIVALPROEX SODIUM 125 MG PO CSDR
250.0000 mg | DELAYED_RELEASE_CAPSULE | Freq: Three times a day (TID) | ORAL | Status: DC
  Administered 2023-11-01 – 2023-11-04 (×11): 250 mg via ORAL
  Filled 2023-11-01 (×12): qty 2

## 2023-11-01 MED ORDER — MIRTAZAPINE 15 MG PO TABS
15.0000 mg | ORAL_TABLET | Freq: Every day | ORAL | Status: DC
  Administered 2023-11-01 – 2023-11-04 (×4): 15 mg via ORAL
  Filled 2023-11-01 (×5): qty 1

## 2023-11-01 MED ORDER — ORAL CARE MOUTH RINSE: 15.0000 mL | OROMUCOSAL | Status: DC | PRN

## 2023-11-01 NOTE — Inpatient Diabetes Management (Signed)
 Inpatient Diabetes Program Recommendations  AACE/ADA: New Consensus Statement on Inpatient Glycemic Control (2015)  Target Ranges:  Prepandial:   less than 140 mg/dL      Peak postprandial:   less than 180 mg/dL (1-2 hours)      Critically ill patients:  140 - 180 mg/dL   Lab Results  Component Value Date   GLUCAP 118 (H) 11/01/2023   HGBA1C 12.3 (H) 09/06/2023    Latest Reference Range & Units 10/31/23 12:52 10/31/23 14:37 10/31/23 19:06 10/31/23 23:41 11/01/23 03:10 11/01/23 03:12 11/01/23 03:31 11/01/23 07:35 11/01/23 11:30  Glucose-Capillary 70 - 99 mg/dL 409 (H) 811 (H) 914 (H) 130 (H) 59 (L) 65 (L) 172 (H) 102 (H) 118 (H)  (H): Data is abnormally high (L): Data is abnormally low  Diabetes history: DM2 Outpatient Diabetes medications: Lantus 30 units am, 15 units hs, Amaryl 2 mg daily, Trulicity 3 mg weekly (not Current orders for Inpatient glycemic control: Lantus 25 units bid, Novolog 0-20 units tid, 0-5 units hs  Inpatient Diabetes Program Recommendations:   Patient admitted in DKA and per chart ran out of insulin x 1 week. Noted A1c 12.4 September 06, 2023. Plan to see pt. To discuss diabetes needs and review medications. 2:00 pm on 11/01/23: Patient was confused and unable to discuss diabetes with pt.  Thank you, Carolyn Norton. Barba Solt, RN, MSN, CDCES  Diabetes Coordinator Inpatient Glycemic Control Team Team Pager [phone] (8am-5pm) 11/01/2023 12:17 PM

## 2023-11-01 NOTE — Evaluation (Signed)
 Clinical/Bedside Swallow Evaluation Patient Details  Name: Carolyn Norton MRN: [id number] Date of Birth: [birth year]  Today's Date: 11/01/2023 Time: SLP Start Time (ACUTE ONLY): 0827 SLP Stop Time (ACUTE ONLY): 0839 SLP Time Calculation (min) (ACUTE ONLY): 12 min  Past Medical History:  Past Medical History:  Diagnosis Date   Alzheimer disease (HCC)    from facility diagnosis, 12/10/22- orientation has improved since she had shunt placed.   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   COVID    History of   Depression    Diabetes mellitus without complication (HCC)    Type 2 (type 1 per patient and aunt)   Dyspnea    Hyperlipidemia    Hypertension    Normal pressure hydrocephalus (HCC)    Seasonal allergies    Past Surgical History:  Past Surgical History:  Procedure Laterality Date   BREAST EXCISIONAL BIOPSY Right    BREAST SURGERY     right breast   CATARACT EXTRACTION W/PHACO Left 02/12/2023   Procedure: CATARACT EXTRACTION PHACO AND INTRAOCULAR LENS PLACEMENT (IOC);  Surgeon: Fabio Pierce, MD;  Location: [facility];  Service: Ophthalmology;  Laterality: Left;  CDE: 22.05   CATARACT EXTRACTION W/PHACO Right 02/26/2023   Procedure: CATARACT EXTRACTION PHACO AND INTRAOCULAR LENS PLACEMENT (IOC);  Surgeon: Fabio Pierce, MD;  Location: [facility];  Service: Ophthalmology;  Laterality: Right;  CDE: 24.40   TOOTH EXTRACTION N/A 12/11/2022   Procedure: DENTAL RESTORATION/EXTRACTIONS;  Surgeon: Ocie Doyne, DMD;  Location: [facility] OR;  Service: Oral Surgery;  Laterality: N/A;   VENTRICULOPERITONEAL SHUNT N/A 05/29/2022   Procedure: Ventriculoperitoneal shunt placement;  Surgeon: Donalee Citrin, MD;  Location: [facility] OR;  Service: Neurosurgery;  Laterality: N/A;   HPI:  Carolyn Norton is a 58 yo F brought in by EMS in extremis.  Found altered, hypoxic, hypotensive, vomiting with DKA after having run out of insulin. ETT 2/22-23  (~27 hours) CT Head 2/22 without acut findings, VP shunt in place.  CXR 2/22: "Minimal bibasilar subsegmental  atelectasis with small left pleural effusion." Pt with hx diabetes mellitus type 2, bipolar disorder type I, previous COVID infection, polysubstance abuse that includes cocaine, FIGO endometroid endometrial cancer low grade, NPH s/p VP shunt.    Assessment / Plan / Recommendation  Clinical Impression  Pt presents with functional swallowing as assessed clinically.  Pt's vocal quality is strong following intubation ~27 hours.  SLP provided oral care prior to administration of PO trials.  Pt tolerated all consistencies trialed, including continuous straw sips of liquid, without any clinical s/s of aspiration.  There was belching x1.  Pt exhibited slightly prolonged oral phase with regular texture solid, and some difficulty with biting through cracker.  Pt does not typically wear dentures.  Discussed diet preference with pt who would prefer softer foods.  Pt's swallowing appears to be at or above functional level observed during 2022 admission.  Pt has no further ST needs. SLP will sign off.     Recommend mechanical soft solids with thin liquids.   SLP Visit Diagnosis: Dysphagia, unspecified (R13.10)    Aspiration Risk  No limitations    Diet Recommendation Dysphagia 3 (Mech soft);Thin liquid    Liquid Administration via: Cup;Straw Medication Administration: Whole meds with liquid (As tolerated, no specific precautions) Supervision:  (Assist PRN) Compensations: Minimize environmental distractions;Slow rate;Small sips/bites    Other  Recommendations Oral Care  Recommendations: Oral care BID    Recommendations for follow up therapy are one component of a multi-disciplinary discharge planning process, led by the attending physician.  Recommendations may be updated based on patient status, additional functional criteria and insurance authorization.  Follow up Recommendations Follow  physician's recommendations for discharge plan and follow up therapies      Assistance Recommended at Discharge  N/A  Functional Status Assessment Patient has not had a recent decline in their functional status  Frequency and Duration  (N/A)          Prognosis Prognosis for improved oropharyngeal function:  (N/A)      Swallow Study   General Date of Onset: 10/30/23 HPI: Carolyn Norton is a 58 yo F brought in by EMS in extremis.  Found altered, hypoxic, hypotensive, vomiting with DKA after having run out of insulin. ETT 2/22-23 (~27 hours) CT Head 2/22 without acut findings, VP shunt in place.  CXR 2/22: "Minimal bibasilar subsegmental  atelectasis with small left pleural effusion." Pt with hx diabetes mellitus type 2, bipolar disorder type I, previous COVID infection, polysubstance abuse that includes cocaine, FIGO endometroid endometrial cancer low grade, NPH s/p VP shunt. Type of Study: Bedside Swallow Evaluation Previous Swallow Assessment: during prior admissions in 2022. MBS 03/13/21 with recommendations for D2/thin 2/2 prolonged mastication Diet Prior to this Study: NPO Temperature Spikes Noted: No Respiratory Status: Nasal cannula Behavior/Cognition: Alert;Cooperative;Pleasant mood Oral Cavity Assessment: Within Functional Limits Oral Care Completed by SLP: Yes Oral Cavity - Dentition: Edentulous Patient Positioning: Upright in bed Baseline Vocal Quality: Normal Volitional Cough: Strong Volitional Swallow: Able to elicit    Oral/Motor/Sensory Function Overall Oral Motor/Sensory Function: Mild impairment Facial ROM: Within Functional Limits Facial Symmetry: Within Functional Limits Lingual ROM: Reduced right (but able to fully lateralize with encouragement.) Lingual Symmetry: Within Functional Limits Lingual Strength: Within Functional Limits Velum: Within Functional Limits Mandible: Within Functional Limits   Ice Chips Ice chips: Not tested   Thin Liquid Thin Liquid:  Within functional limits Presentation: Straw    Nectar Thick Nectar Thick Liquid: Not tested   Honey Thick Honey Thick Liquid: Not tested   Puree Puree: Within functional limits Presentation: Spoon   Solid     Solid: Impaired Oral Phase Functional Implications: Prolonged oral transit      Kerrie Pleasure, MA, CCC-SLP Acute Rehabilitation Services Office: [phone] 11/01/2023,8:57 AM

## 2023-11-01 NOTE — Progress Notes (Addendum)
 NAME:  Carolyn Norton, MRN:  [id number], DOB:  1966/09/05, LOS: 2 ADMISSION DATE:  10/30/2023, CONSULTATION DATE:  10/30/23 REFERRING MD:  EDP, CHIEF COMPLAINT:  found down   History of Present Illness:  Patient found down at home in vomit. Found to be in shock state with DKA. PCCM consulted for admit.  Patient is currently intubated and poorly responsive.  Per father, ran out of insulin first of this week. Could not find pharmacy that carried it.  Pertinent  Medical History  Hx includes DM, FIGO endometroid endometrial cancer low grade, NPH s/p VP shunt, DM2, bipolar  Significant Hospital Events: Including procedures, antibiotic start and stop dates in addition to other pertinent events   10/30/23 admit 2/23: Blood cultures isolated S epidermidis and Proteus.  Zosyn switched to Rocephin.  Anion gap closed.  Successfully extubated.  Interim History / Subjective:  On Rocephin for S epidermidis and Proteus bacteremia.  K 3.2 this AM.  WBC 14.4, down trending.  Afebrile. Vasopressor requirement is coming down Anion gap has closed  Objective   Blood pressure 121/67, pulse 87, temperature 99.3 F (37.4 C), resp. rate (!) 24, height 5\' 4"  (1.626 m), weight 88.3 kg, last menstrual period 01/26/2017, SpO2 98%.    Vent Mode: PSV;CPAP FiO2 (%):  [40 %] 40 % PEEP:  [5 cmH20] 5 cmH20 Pressure Support:  [8 cmH20-10 cmH20] 8 cmH20   Intake/Output Summary (Last 24 hours) at 11/01/2023 0741 Last data filed at 11/01/2023 0400 Gross per 24 hour  Intake 1254.94 ml  Output 1950 ml  Net -695.06 ml   Filed Weights   10/30/23 0612 10/31/23 0205 11/01/23 0435  Weight: 83.2 kg 87.2 kg 88.3 kg    Examination: General: Crtitically ill-appearing obese female.  No acute distress.  Extubated. HEENT: [place]/AT, eyes anicteric.  ETT and OGT in place Neuro: Pleasantly confused.  Follows commands.  Cranial nerves II-XII are symmetric and physiologic. Chest: Bilateral fine crackles at bases, no  wheezes or rhonchi Heart: Regular rate and rhythm, no murmurs or gallops Abdomen: Soft, nondistended, bowel sounds present Skin: No rash  Labs and images reviewed  Resolved Hospital Problem list   Hyperkalemia Diabetic ketoacidosis Endotracheally intubated  Assessment & Plan:  Severe sepsis with septic shock due to Proteus/S epi bacteremia, POA Shock resolved.  Off vasopressors. The S epidermidis only isolated from 1 of 2 cultures, likely a contaminant. Continue Rocephin. Poorly controlled type 1 diabetes Diabetic ketoacidosis, resolved Need to do bedside swallow evaluation and start diet. On Lantus BID and sliding scale insulin. Patient hemoglobin A1c > 12.  Diabetes education. Endotracheally intubated, successfully extubated 2/23 Acute kidney injury due to septic ATN, improving Cr 2.09 today, Avoid nephrotoxic drugs. Renal dosing of medications, as needed. Monitor urine output. Mixed metabolic/septic encephalopathy, unclear baseline Restart home medications (Depakote, Remeron, trazodone, Lyrica). Demand cardiac ischemia Patient serum troponins are slightly elevated, echocardiogram showed no wall motion abnormalities. Obesity Hyperlipidemia Resume atorvastatin. Hypertension Will likely be able to restart home Norvasc/Lopressor tomorrow. Depression  DISPOSITION: OK for transfer out of ICU today.   Best Practice (right click and "Reselect all SmartList Selections" daily)   Diet/type: NPO DVT prophylaxis subcu heparin Pressure ulcer(s): pressure ulcer assessment deferred  GI prophylaxis: PPI Lines: Central line, yes still needed Foley:  Yes, and it is still needed Code Status:  modified DNR Last date of multidisciplinary goals of care discussion [updated father at bedside] Sister and father are POA.  She needs a SNF.  Labs   CBC: Recent  Labs  Lab 10/30/23 0746 10/30/23 1048 10/30/23 1814 10/31/23 0410 11/01/23 0429  WBC  --  27.6*  --  21.3* 14.4*   NEUTROABS  --  23.2*  --   --   --   HGB 11.2* 11.6* 11.9* 11.9* 10.9*  HCT 33.0* 38.2 35.0* 34.6* 33.1*  MCV  --  94.1  --  81.6 84.4  PLT  --  366  --  295 184    Basic Metabolic Panel: Recent Labs  Lab 10/30/23 1414 10/30/23 1809 10/30/23 1814 10/30/23 2254 10/31/23 0410 11/01/23 0429  NA 135 137 135 141 139 143  K 4.1 3.6 3.5 3.5 4.0 3.2*  CL 95* 96*  --  101 101 108  CO2 14* 23  --  25 24 26   GLUCOSE 882* 593*  --  259* 179* 151*  BUN 53* 52*  --  48* 44* 27*  CREATININE 4.31* 4.00*  --  3.71* 3.15* 2.09*  CALCIUM 9.3 9.2  --  9.1 8.9 8.5*  MG  --   --   --   --  1.7 2.1  PHOS  --   --   --   --  3.3 2.2*   GFR: Estimated Creatinine Clearance: 32.3 mL/min (A) (by C-G formula based on SCr of 2.09 mg/dL (H)). Recent Labs  Lab 10/30/23 1048 10/30/23 1244 10/31/23 0410 11/01/23 0429  PROCALCITON 12.37  --   --   --   WBC 27.6*  --  21.3* 14.4*  LATICACIDVEN  --  4.0*  --   --     Liver Function Tests: No results for input(s): "AST", "ALT", "ALKPHOS", "BILITOT", "PROT", "ALBUMIN" in the last 168 hours. No results for input(s): "LIPASE", "AMYLASE" in the last 168 hours. No results for input(s): "AMMONIA" in the last 168 hours.  ABG    Component Value Date/Time   PHART 7.454 (H) 10/30/2023 1814   PCO2ART 33.2 10/30/2023 1814   PO2ART 89 10/30/2023 1814   HCO3 23.3 10/30/2023 1814   TCO2 24 10/30/2023 1814   ACIDBASEDEF 20.0 (H) 10/30/2023 0746   O2SAT 97 10/30/2023 1814     Coagulation Profile: No results for input(s): "INR", "PROTIME" in the last 168 hours.  Cardiac Enzymes: No results for input(s): "CKTOTAL", "CKMB", "CKMBINDEX", "TROPONINI" in the last 168 hours.  HbA1C: Hgb A1c MFr Bld  Date/Time Value Ref Range Status  09/06/2023 11:54 AM 12.3 (H) 4.8 - 5.6 % Final    Comment:    (NOTE)         Prediabetes: 5.7 - 6.4         Diabetes: >6.4         Glycemic control for adults with diabetes: <7.0   02/24/2022 06:52 PM 8.9 (H) 4.8 - 5.6 %  Final    Comment:    (NOTE) Pre diabetes:          5.7%-6.4%  Diabetes:              >6.4%  Glycemic control for   <7.0% adults with diabetes     CBG: Recent Labs  Lab 10/31/23 2341 11/01/23 0310 11/01/23 0312 11/01/23 0331 11/01/23 0735  GLUCAP 130* 59* 65* 172* 102*    Marcelle Smiling, MD Board Certified by the ABIM, Pulmonary Diseases & Critical Care Medicine  After 7pm, Please call E-link [phone]

## 2023-11-01 NOTE — TOC Initial Note (Signed)
 Transition of Care [facility]) - Initial/Assessment Note    Patient Details  Name: Carolyn Norton MRN: [id number] Date of Birth: [birth year]  [facility] CM/SW Contact:    Lakoda Mcanany A [place], LCSW Phone Number: 11/01/2023, 3:53 PM  Clinical Narrative:                  CSW spoke with pt's aunt, Dewayne Hatch to complete assessment as pt is not currently oriented.  She stated that pt is from home with her mother and father, but states that pt's family cannot take care of pt at this time due to their age and pt's level of care needs.   She stated that pt was from [facility] approximately August 2023- June 2024 before coming home with her parents. She stated that pt was caring for herself ok but has had a recent decline that has proven too much for her parents.   She stated that pt would be reluctant to SNF but understands that she cannot return home in this current condition.  She states that she and pt's father are pt's durable and health care power of attorney and have documentation indicating as such.   She stated that she is unsure if pt's Medicaid is Long Term but can assist as needed if that is necessary for placement.   SNF workup completed. Bed offers pending.    TOC will continue to follow.   Expected Discharge Plan: Skilled Nursing Facility Barriers to Discharge: Continued Medical Work up, SNF Pending bed offer   Patient Goals and CMS Choice Patient states their goals for this hospitalization and ongoing recovery are:: go to snf          Expected Discharge Plan and Services       Living arrangements for the past 2 months: Single Family Home                                      Prior Living Arrangements/Services Living arrangements for the past 2 months: Single Family Home Lives with:: Parents          Need for Family Participation in Patient Care: Yes (Comment) Care giver support system in place?: Yes (comment) (pt's son and pt's aunt)       Activities of Daily Living      Permission Sought/Granted                  Emotional Assessment   Attitude/Demeanor/Rapport: Unable to Assess Affect (typically observed): Unable to Assess Orientation: : Fluctuating Orientation (Suspected and/or reported Sundowners) Alcohol / Substance Use: Tobacco Use Psych Involvement: No (comment)  Admission diagnosis:  Hyperkalemia [E87.5] Acute respiratory failure with hypoxia (HCC) [J96.01] Diabetic ketoacidosis with coma associated with type 1 diabetes mellitus (HCC) [E10.11] Patient Active Problem List   Diagnosis Date Noted   (Idiopathic) normal pressure hydrocephalus (HCC) 05/29/2022   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) 05/29/2022   Essential hypertension 02/25/2022   Elevated blood-pressure reading, without diagnosis of hypertension 07/29/2021   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 04/29/2021   Protein-calorie malnutrition, severe 03/23/2021   Controlled type 2 diabetes mellitus with hyperglycemia, with long-term current use of insulin (HCC) 02/11/2021   NPH (normal pressure hydrocephalus) (HCC)    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    Bipolar 1 disorder, depressed, full remission (HCC) 01/18/2021   Generalized weakness 01/11/2021   Acute metabolic  encephalopathy 10/22/2020   Hypotension 10/22/2020   Type 2 diabetes mellitus with hyperlipidemia (HCC) 10/22/2020   Altered mental status    Hyperosmolar hyperglycemic state (HHS) (HCC)    Hyperglycemia    Hyperkalemia    AKI (acute kidney injury) (HCC)    Hypertension    DKA (diabetic ketoacidosis) (HCC) 09/06/2020   COVID-19 09/06/2020   Bipolar 1 disorder (HCC) 09/06/2020   Breast mass status post excision 09/06/2020   Smoker 09/06/2020   Metabolic acidosis due to ingestion of drugs or chemicals 09/06/2020   PCP:  Carin Hock, PA Pharmacy:   [id number] - [place] [place] - [address] AT  [facility] [place] [address] Phone: [phone] Fax: [phone]     Social Drivers of Health (SDOH) Social History: SDOH Screenings   Food Insecurity: Patient Unable To Answer (10/30/2023)  Housing: Patient Unable To Answer (10/30/2023)  Transportation Needs: Patient Unable To Answer (10/30/2023)  Utilities: Patient Unable To Answer (10/30/2023)  Depression (PHQ2-9): Low Risk  (07/24/2020)  Tobacco Use: High Risk (09/22/2023)   SDOH Interventions:     Readmission Risk Interventions     No data to display

## 2023-11-01 NOTE — Progress Notes (Signed)
 eLink Physician-Brief Progress Note Patient Name: Carolyn Norton DOB: Jun 23, 1966 MRN: [id number]   Date of Service  11/01/2023  HPI/Events of Note  K+ 3.2 with Creat 2.09 and GFR 27. Phos is 2.2. Pt NPO but has CVC  eICU Interventions  Replacement ordered     Intervention Category Intermediate Interventions: Electrolyte abnormality - evaluation and management  Ranee Gosselin 11/01/2023, 5:52 AM

## 2023-11-01 NOTE — Progress Notes (Signed)
 Pharmacy Electrolyte Replacement  Recent Labs:  Recent Labs    11/01/23 0429  K 3.2*  MG 2.1  PHOS 2.2*  CREATININE 2.09*    Low Critical Values (K </= 2.5, Phos </= 1, Mg </= 1) Present: None  MD Contacted: Dr. Ardeth Perfect  Plan: Receiving KPhos 15 mmol x1 - will add KCl 40 mEq po x1 to further replace K.   Link Snuffer, PharmD, BCPS, BCCCP Please refer to [facility] for [facility] Pharmacy numbers 11/01/2023, 8:08 AM

## 2023-11-01 NOTE — Evaluation (Signed)
 Physical Therapy Evaluation Patient Details Name: Carolyn Norton MRN: [id number] DOB: 01-29-1966 Today's Date: 11/01/2023  History of Present Illness  Pt is 59 year old presented to [facility] on  10/30/23 for AMS after found down at home. Pt hypotensive, hypoxic and vomiting. Pt intubated in ED. Pt found to have severe sepsis with septic shock due to Proteus/S epi bacteremia, POA and DKA. Pt had run out of insulin a week prior. PMH - NPH s/p VP shunt, DM, bipolar, endometrial CA, HTN, obesity.  Clinical Impression  Pt admitted with above diagnosis and presents to PT with functional limitations due to deficits listed below (See PT problem list). Pt needs skilled PT to maximize independence and safety. Pt requiring assist for mobility and with cognitive deficits. Patient will benefit from continued inpatient follow up therapy, <3 hours/day unless she has enough hands on support at home.            If plan is discharge home, recommend the following: A little help with walking and/or transfers;A little help with bathing/dressing/bathroom;Assistance with cooking/housework;Direct supervision/assist for medications management;Assist for transportation;Help with stairs or ramp for entrance   Can travel by private vehicle   Yes    Equipment Recommendations Other (comment) (To be determined)  Recommendations for Other Services       Functional Status Assessment Patient has had a recent decline in their functional status and demonstrates the ability to make significant improvements in function in a reasonable and predictable amount of time.     Precautions / Restrictions Precautions Precautions: Fall;Other (comment) Precaution/Restrictions Comments: incontinent of stool      Mobility  Bed Mobility Overal bed mobility: Needs Assistance Bed Mobility: Supine to Sit     Supine to sit: Min assist, HOB elevated, Used rails     General bed mobility comments: Assist to bring legs off of bed,  elevate trunk into sitting and bring hips to EOB    Transfers Overall transfer level: Needs assistance Equipment used: Rolling walker (2 wheels) Transfers: Sit to/from Stand, Bed to chair/wheelchair/BSC Sit to Stand: Min assist   Step pivot transfers: Min assist       General transfer comment: Assist to power up and to stabilize. Verbal/tactile cues for hand placement.    Ambulation/Gait Ambulation/Gait assistance: Min assist Gait Distance (Feet): 50 Feet Assistive device: Rolling walker (2 wheels) Gait Pattern/deviations: Step-through pattern, Decreased step length - right, Decreased step length - left, Shuffle, Trunk flexed Gait velocity: decr Gait velocity interpretation: <1.8 ft/sec, indicate of risk for recurrent falls   General Gait Details: Assist for balance and support. Frequent cues to stay closer to walker and stand more erect.  Stairs            Wheelchair Mobility     Tilt Bed    Modified Rankin (Stroke Patients Only)       Balance Overall balance assessment: Needs assistance Sitting-balance support: No upper extremity supported, Feet supported Sitting balance-Leahy Scale: Fair     Standing balance support: Bilateral upper extremity supported, Reliant on assistive device for balance Standing balance-Leahy Scale: Poor Standing balance comment: walker and CGA for static standing                             Pertinent Vitals/Pain Pain Assessment Pain Assessment: Faces Faces Pain Scale: Hurts little more Pain Location: lt foot to touch Pain Descriptors / Indicators: Grimacing, Guarding, Sore Pain Intervention(s): Limited activity within patient's tolerance, Monitored  during session, Repositioned    Home Living Family/patient expects to be discharged to:: Private residence Living Arrangements: Parent Available Help at Discharge: Family;Available PRN/intermittently Type of Home: House Home Access: Level entry       Home Layout:  One level Home Equipment: Agricultural consultant (2 wheels)      Prior Function Prior Level of Function : Patient poor historian/Family not available             Mobility Comments: Reports indpendent without assistive device       Extremity/Trunk Assessment   Upper Extremity Assessment Upper Extremity Assessment: Defer to OT evaluation    Lower Extremity Assessment Lower Extremity Assessment: Generalized weakness    Cervical / Trunk Assessment Cervical / Trunk Assessment: Kyphotic  Communication   Communication Communication: No apparent difficulties    Cognition Arousal: Alert Behavior During Therapy: Impulsive   PT - Cognitive impairments: No family/caregiver present to determine baseline                       PT - Cognition Comments: Disoriented to place and time. Poor short term memory. Hallucinating that there was cup of juice and coffee maker in her room Following commands: Impaired Following commands impaired: Follows one step commands with increased time     Cueing Cueing Techniques: Verbal cues, Tactile cues     General Comments General comments (skin integrity, edema, etc.): VSS on RA    Exercises     Assessment/Plan    PT Assessment Patient needs continued PT services  PT Problem List Decreased strength;Decreased balance;Decreased mobility;Decreased cognition;Pain;Decreased safety awareness       PT Treatment Interventions DME instruction;Gait training;Functional mobility training;Therapeutic activities;Therapeutic exercise;Cognitive remediation;Balance training;Patient/family education    PT Goals (Current goals can be found in the Care Plan section)  Acute Rehab PT Goals Patient Stated Goal: go home PT Goal Formulation: With patient Time For Goal Achievement: 11/15/23 Potential to Achieve Goals: Good    Frequency Min 1X/week     Co-evaluation               AM-PAC PT "6 Clicks" Mobility  Outcome Measure Help needed turning  from your back to your side while in a flat bed without using bedrails?: A Little Help needed moving from lying on your back to sitting on the side of a flat bed without using bedrails?: A Little Help needed moving to and from a bed to a chair (including a wheelchair)?: A Little Help needed standing up from a chair using your arms (e.g., wheelchair or bedside chair)?: A Little Help needed to walk in hospital room?: A Little Help needed climbing 3-5 steps with a railing? : A Lot 6 Click Score: 17    End of Session Equipment Utilized During Treatment: Gait belt Activity Tolerance: Patient tolerated treatment well Patient left: in chair;with call bell/phone within reach;with chair alarm set Nurse Communication: Mobility status PT Visit Diagnosis: Unsteadiness on feet (R26.81);Other abnormalities of gait and mobility (R26.89);Muscle weakness (generalized) (M62.81);Pain Pain - Right/Left: Left Pain - part of body: Ankle and joints of foot    Time: 1350-1435 PT Time Calculation (min) (ACUTE ONLY): 45 min   Charges:   PT Evaluation $PT Eval Moderate Complexity: 1 Mod PT Treatments $Gait Training: 23-37 mins PT General Charges $$ ACUTE PT VISIT: 1 Visit         [facility] PT Acute Rehabilitation Services Office [phone]   Angelina Ok [facility] 11/01/2023, 3:06 PM

## 2023-11-01 NOTE — Progress Notes (Signed)
 Hypoglycemic Event  CBG: 65  Treatment: D50 50 mL (25 gm)  Symptoms: Pale  Follow-up CBG: Time:0331 CBG Result:172  Possible Reasons for Event: Inadequate meal intake  Carolyn Norton, Dondrell Loudermilk P

## 2023-11-02 DIAGNOSIS — E875 Hyperkalemia: Secondary | ICD-10-CM | POA: Diagnosis not present

## 2023-11-02 LAB — GLUCOSE, CAPILLARY
Glucose-Capillary: 133 mg/dL — ABNORMAL HIGH (ref 70–99)
Glucose-Capillary: 167 mg/dL — ABNORMAL HIGH (ref 70–99)
Glucose-Capillary: 164 mg/dL — ABNORMAL HIGH (ref 70–99)

## 2023-11-02 LAB — BASIC METABOLIC PANEL
Anion gap: 7 (ref 5–15)
BUN: 17 mg/dL (ref 6–20)
CO2: 22 mmol/L (ref 22–32)
Calcium: 8.4 mg/dL — ABNORMAL LOW (ref 8.9–10.3)
Chloride: 112 mmol/L — ABNORMAL HIGH (ref 98–111)
Creatinine, Ser: 1.3 mg/dL — ABNORMAL HIGH (ref 0.44–1.00)
GFR, Estimated: 48 mL/min — ABNORMAL LOW (ref >60)
Glucose, Bld: 136 mg/dL — ABNORMAL HIGH (ref 70–99)
Potassium: 3.7 mmol/L (ref 3.5–5.1)
Sodium: 141 mmol/L (ref 135–145)

## 2023-11-02 LAB — CBC
HCT: 35.9 % — ABNORMAL LOW (ref 36.0–46.0)
Hemoglobin: 11.9 g/dL — ABNORMAL LOW (ref 12.0–15.0)
MCH: 27.9 pg (ref 26.0–34.0)
MCHC: 33.1 g/dL (ref 30.0–36.0)
MCV: 84.3 fL (ref 80.0–100.0)
Platelets: 177 10*3/uL (ref 150–400)
RBC: 4.26 MIL/uL (ref 3.87–5.11)
RDW: 14.6 % (ref 11.5–15.5)
WBC: 8.9 10*3/uL (ref 4.0–10.5)
nRBC: 0 % (ref 0.0–0.2)

## 2023-11-02 LAB — PHOSPHORUS: Phosphorus: 2.5 mg/dL (ref 2.5–4.6)

## 2023-11-02 LAB — MAGNESIUM: Magnesium: 1.9 mg/dL (ref 1.7–2.4)

## 2023-11-02 MED ORDER — METOPROLOL TARTRATE 12.5 MG HALF TABLET
12.5000 mg | ORAL_TABLET | Freq: Two times a day (BID) | ORAL | Status: DC
  Administered 2023-11-02 – 2023-11-04 (×6): 12.5 mg via ORAL
  Filled 2023-11-02 (×6): qty 1

## 2023-11-02 MED ORDER — AMLODIPINE BESYLATE 5 MG PO TABS
5.0000 mg | ORAL_TABLET | Freq: Every day | ORAL | Status: DC
  Administered 2023-11-02 – 2023-11-04 (×3): 5 mg via ORAL
  Filled 2023-11-02 (×3): qty 1

## 2023-11-02 MED ORDER — INSULIN STARTER KIT- PEN NEEDLES (ENGLISH)
1.0000 | Freq: Once | Status: AC
  Administered 2023-11-02: 1
  Filled 2023-11-02: qty 1

## 2023-11-02 MED ORDER — ENSURE ENLIVE PO LIQD
237.0000 mL | Freq: Two times a day (BID) | ORAL | Status: DC
  Administered 2023-11-02 – 2023-11-04 (×5): 237 mL via ORAL

## 2023-11-02 NOTE — Progress Notes (Signed)
   11/01/23 2014  Vitals  Temp 98.1 F (36.7 C)  Temp Source Oral  BP (!) 150/80  MAP (mmHg) 101  BP Location Left Wrist  BP Method Automatic  Patient Position (if appropriate) Sitting  Pulse Rate 88  Pulse Rate Source Monitor  Resp 18  MEWS COLOR  MEWS Score Color Green  Oxygen Therapy  SpO2 100 %  O2 Device Room Air  MEWS Score  MEWS Temp 0  MEWS Systolic 0  MEWS Pulse 0  MEWS RR 0  MEWS LOC 0  MEWS Score 0   Pt is A&O x 3, forgetful and confused at times, VSS, on room air. Denies pain. Purewick in use and voiding.  Safety maintained. Bed alarm on. Call bell in use. Will continue to monitor.

## 2023-11-02 NOTE — Plan of Care (Signed)

## 2023-11-02 NOTE — Care Management Important Message (Signed)
 Important Message  Patient Details  Name: Carolyn Norton MRN: [id number] Date of Birth: 04/11/1966   Important Message Given:  Yes - Medicare IM     Dorena Bodo 11/02/2023, 1:38 PM

## 2023-11-02 NOTE — Progress Notes (Signed)
 [facility] Progress Note  Patient: Carolyn Norton    FIE:[id number]  DOA: 10/30/2023     Date of Service: the patient was seen and examined on 11/02/2023  Chief Complaint  Patient presents with   Altered Mental Status   Brief hospital course: 58 year old female with PMH of DM, FIGO endometroid endometrial cancer low grade, NPH s/p VP shunt, DM2, bipolar, as reviewed from EMR, presented at [facility] ED with DKA and shock status.  Patient was on septic shock, she was intubated and admitted in the ICU. Patient was on pressure support which was weaned off gradually, patient was extubated on 10/31/2023. Please review detailed previous notes.  Further management as below. Patient was transferred under [facility] service on 11/02/2023   Assessment and Plan:  # Severe sepsis with septic shock due to Proteus/S. Epi bacteremia Shock resolved.  Off vasopressors. The S epidermidis only isolated from 1 of 2 cultures, likely a contaminant. Continue Rocephin.  # Poorly controlled type 1 diabetes # Diabetic ketoacidosis, resolved SLP eval done, patient was started on dysphagia 3 diet with thin liquids On Lantus 25 u BID and sliding scale insulin. Patient hemoglobin A1c > 12.  Diabetes education.  # Endotracheally intubated, successfully extubated 2/23 Acute kidney injury due to septic ATN, improving Cr 1.3  Avoid nephrotoxic drugs. Renal dosing of medications, as needed. Monitor urine output.  # Mixed metabolic/septic encephalopathy, unclear baseline Restart home medications (Depakote, Remeron, trazodone). Lyrica is still on hold, resume when needed   # Demand cardiac ischemia Patient serum troponins are slightly elevated, echocardiogram showed no wall motion abnormalities.  # Hyperlipidemia Resume atorvastatin.  # Hypertension Presented with septic shock, off pressure support now 2/25 BP elevated, hypotensive, resumed amlodipine 5 mg p.o. daily and metoprolol 12.5 mg p.o. twice daily  home dose with holding parameters. Monitor BP and titrate medications accordingly.   # Depression Continue Remeron and Depakote home dose Continue supportive care  # Hypokalemia, potassium repleted.  # Obesity, class I Body mass index is 32.73 kg/m.  Interventions:  Diet: Diabetic diet DVT Prophylaxis: Subcutaneous Heparin    Advance goals of care discussion: Full code  Family Communication: family was  not present at bedside, at the time of interview.  The pt provided permission to discuss medical plan with the family. Opportunity was given to ask question and all questions were answered satisfactorily.   Disposition:  Pt is from home, admitted with septic shock, DKA, still has been resolved, and patient is gradually improving.  Still on IV antibiotics, which precludes a safe discharge. Discharge to TBD after PT/OT eval, may need SNF, when stable, most likely discharge in 1 to 2 days.  Subjective: No significant events overnight.  Patient was feeling weak and tired, laying in the bed comfortably.  Denied any specific complaints.  No chest pain or palpitation, no shortness of breath.  Physical Exam: General: NAD, lying comfortably Appear in no distress, affect appropriate Eyes: PERRLA ENT: Oral Mucosa Clear, moist  Neck: no JVD,  Cardiovascular: S1 and S2 Present, no Murmur,  Respiratory: good respiratory effort, Bilateral Air entry equal and Decreased, no Crackles, no wheezes Abdomen: Bowel Sound present, Soft and no tenderness,  Skin: no rashes Extremities: no Pedal edema, no calf tenderness Neurologic: without any new focal findings Gait not checked due to patient safety concerns  Vitals:   11/01/23 2014 11/02/23 0455 11/02/23 0500 11/02/23 0757  BP: (!) 150/80 (!) 161/79  (!) 152/74  Pulse: 88 89  90  Resp: 18  18  20  Temp: 98.1 F (36.7 C) 98.2 F (36.8 C)  98 F (36.7 C)  TempSrc: Oral Oral  Oral  SpO2: 100% 97%  96%  Weight:   86.5 kg   Height:         Intake/Output Summary (Last 24 hours) at 11/02/2023 1333 Last data filed at 11/02/2023 0451 Gross per 24 hour  Intake 357.33 ml  Output 2256 ml  Net -1898.67 ml   Filed Weights   10/31/23 0205 11/01/23 0435 11/02/23 0500  Weight: 87.2 kg 88.3 kg 86.5 kg    Data Reviewed: I have personally reviewed and interpreted daily labs, tele strips, imagings as discussed above. I reviewed all nursing notes, pharmacy notes, vitals, pertinent old records I have discussed plan of care as described above with RN and patient/family.  CBC: Recent Labs  Lab 10/30/23 1048 10/30/23 1814 10/31/23 0410 11/01/23 0429 11/02/23 0634  WBC 27.6*  --  21.3* 14.4* 8.9  NEUTROABS 23.2*  --   --   --   --   HGB 11.6* 11.9* 11.9* 10.9* 11.9*  HCT 38.2 35.0* 34.6* 33.1* 35.9*  MCV 94.1  --  81.6 84.4 84.3  PLT 366  --  295 184 177   Basic Metabolic Panel: Recent Labs  Lab 10/30/23 1809 10/30/23 1814 10/30/23 2254 10/31/23 0410 11/01/23 0429 11/02/23 0634  NA 137 135 141 139 143 141  K 3.6 3.5 3.5 4.0 3.2* 3.7  CL 96*  --  101 101 108 112*  CO2 23  --  25 24 26 22   GLUCOSE 593*  --  259* 179* 151* 136*  BUN 52*  --  48* 44* 27* 17  CREATININE 4.00*  --  3.71* 3.15* 2.09* 1.30*  CALCIUM 9.2  --  9.1 8.9 8.5* 8.4*  MG  --   --   --  1.7 2.1 1.9  PHOS  --   --   --  3.3 2.2* 2.5    Studies: No results found.  Scheduled Meds:  atorvastatin  40 mg Oral Daily   Chlorhexidine Gluconate Cloth  6 each Topical Daily   divalproex  250 mg Oral TID   feeding supplement  237 mL Oral BID BM   heparin injection (subcutaneous)  5,000 Units Subcutaneous Q8H   insulin aspart  0-20 Units Subcutaneous TID WC   insulin aspart  0-5 Units Subcutaneous QHS   insulin glargine  25 Units Subcutaneous BID   mirtazapine  15 mg Oral QHS   mouth rinse  15 mL Mouth Rinse 4 times per day   pantoprazole  40 mg Oral QHS   sodium chloride flush  10-40 mL Intracatheter Q12H   Continuous Infusions:  cefTRIAXone  (ROCEPHIN)  IV 2 g (11/02/23 1234)   PRN Meds: acetaminophen, docusate, mouth rinse, mouth rinse, polyethylene glycol, pregabalin, sodium chloride flush, traZODone  Time spent: 55 minutes  Author: Gillis Santa. MD [facility] 11/02/2023 1:33 PM  To reach On-call, see care teams to locate the attending and reach out to them via [email or web address]. If 7PM-7AM, please contact night-coverage If you still have difficulty reaching the attending provider, please page the [facility] (Director on Call) for [facility] on amion for assistance.

## 2023-11-02 NOTE — Progress Notes (Signed)
 PT CBG 172

## 2023-11-02 NOTE — Inpatient Diabetes Management (Signed)
 Inpatient Diabetes Program Recommendations  AACE/ADA: New Consensus Statement on Inpatient Glycemic Control (2015)  Target Ranges:  Prepandial:   less than 140 mg/dL      Peak postprandial:   less than 180 mg/dL (1-2 hours)      Critically ill patients:  140 - 180 mg/dL   Lab Results  Component Value Date   GLUCAP 133 (H) 11/02/2023   HGBA1C 12.3 (H) 09/06/2023    Latest Reference Range & Units 11/01/23 03:31 11/01/23 07:35 11/01/23 11:30 11/01/23 15:27 11/01/23 20:24 11/02/23 08:00  Glucose-Capillary 70 - 99 mg/dL 914 (H) 782 (H) 956 (H) 235 (H) 230 (H) 133 (H)  (H): Data is abnormally high  Diabetes history: DM2 Outpatient Diabetes medications: Lantus 30 units am, 15 units hs, Amaryl 2 mg daily, Trulicity 3 mg weekly (not Current orders for Inpatient glycemic control: Lantus 25 units bid, Novolog 0-20 units tid, 0-5 units hs  Inpatient Diabetes Program Recommendations:   Spoke with patient's father on the phone ro review diabetes management. Patient lives with her parents and has been taking her own insulin. Patient didn't share with her dad that she was out of insulin until she was completely out and [facility] were [facility] so extended refill x 3 days. When father picked up the insulin order, it was in pens but had no needles due to no prescription. The home health nurse assisted pt. By sending pen needles and placed on front porch but no one notified father pen needles had been delivered and had no education how to use insulin pens. Patient has been using vial/syringe for years.  Sent father video of how to use insulin pens. Father shared that patient is probably going to a rehab for a while prior to coming home. When patient arrives home, father has plans to keep up with patient's insulin and know when she needs to pick up refills ahead of time. Father is very supportive and also caring for his wife with health issues.  Thank you, Billy Fischer. Treasure Ochs, RN, MSN, CDCES  Diabetes  Coordinator Inpatient Glycemic Control Team Team Pager [phone] (8am-5pm) 11/02/2023 10:22 AM

## 2023-11-02 NOTE — Evaluation (Signed)
 Occupational Therapy Evaluation Patient Details Name: Carolyn Norton MRN: [id number] DOB: Sep 11, 1965 Today's Date: 11/02/2023   History of Present Illness   Pt is 58 year old presented to [facility] on  10/30/23 for AMS after found down at home. Pt hypotensive, hypoxic and vomiting. Pt intubated in ED. Pt found to have severe sepsis with septic shock due to Proteus/S epi bacteremia, POA and DKA. Pt had run out of insulin a week prior. PMH - NPH s/p VP shunt, DM, bipolar, endometrial CA, HTN, obesity.     Clinical Impressions Pt evaluated s/p the admission list above. At baseline, pt lives at home with family and completes functional mobility with MOD I using RW and all ADLs/IADLs independently per pt report. Pt is a poor historian and family was not present to confirm PLOF. Upon evaluation, pt was limited by impaired cognition, impulsivity, safety awareness, activity tolerance, and pain in RUE. Overall, pt required MOD A to complete all aspects of functional mobility tasks and inconsistently responded to one-step verbal commands. Based on evaluation, pt will require up to MOD A for ADLs. OT to continue following pt acutely to address functional needs with d/c recommendations of follow-up OT services <3hrs/day to maximize functional independence.      If plan is discharge home, recommend the following:   A little help with walking and/or transfers;Supervision due to cognitive status;Assist for transportation;Direct supervision/assist for financial management;Direct supervision/assist for medications management;Assistance with cooking/housework;A lot of help with bathing/dressing/bathroom     Functional Status Assessment   Patient has had a recent decline in their functional status and demonstrates the ability to make significant improvements in function in a reasonable and predictable amount of time.     Equipment Recommendations   Other (comment) (defer)     Recommendations for Other  Services         Precautions/Restrictions   Precautions Precautions: Fall Restrictions Weight Bearing Restrictions Per Provider Order: No     Mobility Bed Mobility Overal bed mobility: Needs Assistance Bed Mobility: Supine to Sit     Supine to sit: Mod assist, HOB elevated, Used rails     General bed mobility comments: Pt required verbal cues for sequencing. Assistance providing to bring BLE off of bed and to elevate trunk. Pt required assistance to scoot forward until B feet were flat on the floor. Pt impulsive flopped back on bed during 1st attempt due RUE pain with movement.    Transfers Overall transfer level: Needs assistance Equipment used: Rolling walker (2 wheels) Transfers: Sit to/from Stand Sit to Stand: Mod assist           General transfer comment: verbal/tactile cues provided for hand placement. MOD A to power up. Pt required verbal/tactile cues to improve upright standing posture.      Balance Overall balance assessment: Needs assistance Sitting-balance support: Bilateral upper extremity supported, Feet supported Sitting balance-Leahy Scale: Fair Sitting balance - Comments: static sitting EOB   Standing balance support: Bilateral upper extremity supported, During functional activity, Reliant on assistive device for balance Standing balance-Leahy Scale: Poor Standing balance comment: functional mobility using RW in room.                           ADL either performed or assessed with clinical judgement   ADL Overall ADL's : Needs assistance/impaired Eating/Feeding: Independent   Grooming: Wash/dry hands;Wash/dry face;Oral care;Applying deodorant;Brushing hair;Supervision/safety;Cueing for sequencing;Sitting   Upper Body Bathing: Sitting;Cueing for sequencing;Minimal assistance  Lower Body Bathing: Moderate assistance;Cueing for safety;Cueing for sequencing;Sitting/lateral leans   Upper Body Dressing : Cueing for  sequencing;Sitting;Minimal assistance   Lower Body Dressing: Moderate assistance;Sit to/from stand   Toilet Transfer: Moderate assistance;Ambulation;Regular Toilet;Rolling walker (2 wheels);Cueing for sequencing;Cueing for safety   Toileting- Clothing Manipulation and Hygiene: Moderate assistance;Sit to/from stand;Sitting/lateral lean;Cueing for sequencing;Cueing for safety       Functional mobility during ADLs: Moderate assistance;Rolling walker (2 wheels);Cueing for sequencing;Cueing for safety General ADL Comments: Pt with decreased safety awareness and impulsive during functional mobility tasks. Pt with reports of 10/10 LOP in R shoulder on this date but was able to use RUE functionally. Pt required verbal cues for sequencing and inconsistently followed one-step verbal commands during functional tasks.     Vision Baseline Vision/History: 0 No visual deficits Ability to See in Adequate Light: 0 Adequate Patient Visual Report: No change from baseline Vision Assessment?: No apparent visual deficits     Perception Perception: Not tested       Praxis Praxis: Not tested       Pertinent Vitals/Pain Pain Assessment Pain Assessment: 0-10 Pain Score: 10-Worst pain ever Pain Location: R shoulder Pain Descriptors / Indicators: Grimacing, Guarding, Sore Pain Intervention(s): Monitored during session, Other (comment) (RN notified)     Extremity/Trunk Assessment Upper Extremity Assessment Upper Extremity Assessment: Overall WFL for tasks assessed (Reports of pain in RUE but did not affect functional use)   Lower Extremity Assessment Lower Extremity Assessment: Defer to PT evaluation   Cervical / Trunk Assessment Cervical / Trunk Assessment: Kyphotic   Communication Communication Communication: No apparent difficulties   Cognition Arousal: Alert Behavior During Therapy: Impulsive Cognition: Cognition impaired   Orientation impairments: Situation Awareness: Online awareness  impaired Memory impairment (select all impairments): Short-term memory Attention impairment (select first level of impairment): Sustained attention Executive functioning impairment (select all impairments): Organization, Sequencing, Reasoning, Problem solving, Initiation OT - Cognition Comments: Followed one-step verbal commands inconsistently. Decreased safety awareness and impulsivity during functional tasks.                 Following commands: Impaired Following commands impaired: Follows one step commands inconsistently     Cueing  General Comments   Cueing Techniques: Verbal cues;Tactile cues      Exercises     Shoulder Instructions      Home Living Family/patient expects to be discharged to:: Private residence Living Arrangements: Parent Available Help at Discharge: Family;Available PRN/intermittently Type of Home: House Home Access: Level entry     Home Layout: One level     Bathroom Shower/Tub: Producer, television/film/video: Handicapped height     Home Equipment: Agricultural consultant (2 wheels)          Prior Functioning/Environment Prior Level of Function : Patient poor historian/Family not available             Mobility Comments: Pt reported using RW for functional mobility ADLs Comments: Pt reported completing ADLs independently    OT Problem List: Decreased strength;Decreased range of motion;Decreased activity tolerance;Impaired balance (sitting and/or standing);Decreased cognition;Decreased safety awareness;Decreased knowledge of use of DME or AE   OT Treatment/Interventions: Self-care/ADL training;Therapeutic exercise;DME and/or AE instruction;Therapeutic activities;Cognitive remediation/compensation;Patient/family education;Balance training      OT Goals(Current goals can be found in the care plan section)   Acute Rehab OT Goals Patient Stated Goal: none stated OT Goal Formulation: With patient Time For Goal Achievement:  11/16/23 Potential to Achieve Goals: Good ADL Goals Pt Will Perform Grooming: with supervision;standing  Pt Will Perform Upper Body Dressing: sitting;with supervision Pt Will Perform Lower Body Dressing: with supervision;sit to/from stand Pt Will Transfer to Toilet: with supervision;ambulating;regular height toilet Pt Will Perform Toileting - Clothing Manipulation and hygiene: with supervision;sit to/from stand;sitting/lateral leans Additional ADL Goal #1: Pt will demonstrate improved activity tolerance by completing 10 minutes of OOB functional activity   OT Frequency:  Min 1X/week    Co-evaluation              AM-PAC OT "6 Clicks" Daily Activity     Outcome Measure Help from another person eating meals?: None Help from another person taking care of personal grooming?: A Little Help from another person toileting, which includes using toliet, bedpan, or urinal?: A Lot Help from another person bathing (including washing, rinsing, drying)?: A Lot Help from another person to put on and taking off regular upper body clothing?: A Little Help from another person to put on and taking off regular lower body clothing?: A Lot 6 Click Score: 16   End of Session Equipment Utilized During Treatment: Gait belt;Rolling walker (2 wheels) Nurse Communication: Mobility status;Other (comment) (Pt reports of pain in RUE)  Activity Tolerance: Patient tolerated treatment well Patient left: in chair;with call bell/phone within reach;with chair alarm set  OT Visit Diagnosis: Unsteadiness on feet (R26.81);Other abnormalities of gait and mobility (R26.89);History of falling (Z91.81);Muscle weakness (generalized) (M62.81)                Time: 4782-9562 OT Time Calculation (min): 13 min Charges:     Lynnda Shields 11/02/2023, 9:51 AM

## 2023-11-02 NOTE — NC FL2 (Signed)
 [place] MEDICAID FL2 LEVEL OF CARE FORM     IDENTIFICATION  Patient Name: Carolyn Norton Birthdate: 1965/12/06 Sex: female Admission Date (Current Location): 10/30/2023  [place] and [place] Number:  Haynes Bast [id number] Facility and Address:  [facility], 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) 05/29/2022   Essential hypertension 02/25/2022   Elevated blood-pressure reading, without diagnosis of hypertension 07/29/2021   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 04/29/2021   Protein-calorie malnutrition, severe 03/23/2021   Controlled type 2 diabetes mellitus with hyperglycemia, with long-term current use of insulin (HCC) 02/11/2021   NPH (normal pressure hydrocephalus) (HCC)    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    Bipolar 1 disorder, depressed, full remission (HCC) 01/18/2021   Generalized weakness 01/11/2021   Acute metabolic encephalopathy 10/22/2020   Hypotension 10/22/2020   Type 2 diabetes mellitus with hyperlipidemia (HCC) 10/22/2020   Altered mental status    Hyperosmolar hyperglycemic state (HHS) (HCC)    Hyperglycemia    Hyperkalemia    AKI (acute kidney injury) (HCC)    Hypertension    DKA (diabetic ketoacidosis) (HCC) 09/06/2020   COVID-19 09/06/2020   Bipolar 1 disorder (HCC) 09/06/2020   Breast mass status post excision 09/06/2020   Smoker 09/06/2020    Metabolic acidosis due to ingestion of drugs or chemicals 09/06/2020    Orientation RESPIRATION BLADDER Height & Weight     Self  O2 (2L) Incontinent, Indwelling catheter Weight: 190 lb 11.2 oz (86.5 kg) Height:  5\' 4"  (162.6 cm)  BEHAVIORAL SYMPTOMS/MOOD NEUROLOGICAL BOWEL NUTRITION STATUS      Continent Diet (see DC summary)  AMBULATORY STATUS COMMUNICATION OF NEEDS Skin   Limited Assist Verbally Other (Comment) (Thigh Posterior;Proximal;Bilateral Small red rash, MASD)                       Personal Care Assistance Level of Assistance  Bathing, Feeding, Dressing Bathing Assistance: Maximum assistance Feeding assistance: Limited assistance Dressing Assistance: Maximum assistance     Functional Limitations Info  Sight, Hearing, Speech Sight Info: Adequate Hearing Info: Adequate Speech Info: Adequate    SPECIAL CARE FACTORS FREQUENCY  PT (By licensed PT), OT (By licensed OT)     PT Frequency: 5x/week OT Frequency: 5x/week            Contractures Contractures Info: Not present    Additional Factors Info  Code Status, Allergies, Insulin Sliding Scale Code Status Info: FULL Allergies Info: Erythromycin   Insulin Sliding Scale Info: Insulin aspart (novoLOG) injection 0-5 Units  Dose: 0-5 Units. insulin glargine (LANTUS) injection 25 Units  Dose: 25 Units  Freq: 2 times daily Route: [place]       Current Medications (11/02/2023):  This is the current hospital active medication list Current Facility-Administered Medications  Medication Dose Route Frequency Provider Last Rate Last Admin   acetaminophen (TYLENOL) tablet 650 mg  650 mg Oral Q6H PRN Quenton Fetter,  RPH       atorvastatin (LIPITOR) tablet 40 mg  40 mg Oral Daily Quenton Fetter, RPH   40 mg at 11/01/23 2138   cefTRIAXone (ROCEPHIN) 2 g in sodium chloride 0.9 % 100 mL IVPB  2 g Intravenous Q24H Cheri Fowler, MD   Stopped at 11/01/23 1158   Chlorhexidine Gluconate Cloth 2 % PADS 6 each  6 each Topical  Daily Lorin Glass, MD   6 each at 11/01/23 2138   divalproex (DEPAKOTE SPRINKLE) capsule 250 mg  250 mg Oral TID Quenton Fetter, RPH   250 mg at 11/01/23 2132   docusate (COLACE) 50 MG/5ML liquid 100 mg  100 mg Per Tube BID PRN Lorin Glass, MD       feeding supplement (ENSURE ENLIVE / ENSURE PLUS) liquid 237 mL  237 mL Oral BID BM Cheri Fowler, MD       heparin injection 5,000 Units  5,000 Units Subcutaneous Q8H Lorin Glass, MD   5,000 Units at 11/02/23 0559   insulin aspart (novoLOG) injection 0-20 Units  0-20 Units Subcutaneous TID WC Link Snuffer B, RPH   7 Units at 11/01/23 1646   insulin aspart (novoLOG) injection 0-5 Units  0-5 Units Subcutaneous QHS Quenton Fetter, RPH   2 Units at 11/01/23 2136   insulin glargine (LANTUS) injection 25 Units  25 Units Subcutaneous BID Cheri Fowler, MD   25 Units at 11/01/23 2247   mirtazapine (REMERON) tablet 15 mg  15 mg Oral QHS Marcelle Smiling, MD   15 mg at 11/01/23 2131   Oral care mouth rinse  15 mL Mouth Rinse PRN Lorin Glass, MD       Oral care mouth rinse  15 mL Mouth Rinse 4 times per day Cheri Fowler, MD   15 mL at 11/01/23 2137   Oral care mouth rinse  15 mL Mouth Rinse PRN Cheri Fowler, MD       pantoprazole (PROTONIX) EC tablet 40 mg  40 mg Oral QHS Link Snuffer B, RPH   40 mg at 11/01/23 2132   polyethylene glycol (MIRALAX / GLYCOLAX) packet 17 g  17 g Per Tube Daily PRN Lorin Glass, MD       pregabalin (LYRICA) capsule 75 mg  75 mg Oral BID PRN Marcelle Smiling, MD       sodium chloride flush (NS) 0.9 % injection 10-40 mL  10-40 mL Intracatheter Q12H Lorin Glass, MD   10 mL at 11/01/23 2137   sodium chloride flush (NS) 0.9 % injection 10-40 mL  10-40 mL Intracatheter PRN Lorin Glass, MD       traZODone (DESYREL) tablet 50 mg  50 mg Oral QHS PRN Marcelle Smiling, MD         Discharge Medications: Please see discharge summary for a list of discharge medications.  Relevant Imaging  Results:  Relevant Lab Results:   Additional Information SS#: [id number]  Zevin Nevares A [place], LCSW

## 2023-11-02 NOTE — Progress Notes (Signed)
 Mobility Specialist: Progress Note   11/02/23 1211  Mobility  Activity Ambulated with assistance in room  Level of Assistance Contact guard assist, steadying assist  Assistive Device Front wheel walker  Distance Ambulated (ft) 30 ft  Activity Response Tolerated well  Mobility Referral Yes  Mobility visit 1 Mobility  Mobility Specialist Start Time (ACUTE ONLY) 1112  Mobility Specialist Stop Time (ACUTE ONLY) 1126  Mobility Specialist Time Calculation (min) (ACUTE ONLY) 14 min    Pt was received in bed. Initially declined mobility but in the next sentence requesting to get up and move around the room. Also requesting to sit in the chair but later declined that as well. MinA for bed mobility to assist with trunk elevation. MinA for STS, CG for ambulation. Slightly impulsive once OOB. Requiring mod cues for RW steering and to correct posture during ambulation. Left in bed with all needs met, call bell in reach.    Maurene Capes Mobility Specialist Please contact via SecureChat or Rehab office at [phone]

## 2023-11-03 ENCOUNTER — Telehealth: Payer: Self-pay

## 2023-11-03 LAB — GLUCOSE, CAPILLARY
Glucose-Capillary: 173 mg/dL — ABNORMAL HIGH (ref 70–99)
Glucose-Capillary: 188 mg/dL — ABNORMAL HIGH (ref 70–99)
Glucose-Capillary: 129 mg/dL — ABNORMAL HIGH (ref 70–99)
Glucose-Capillary: 126 mg/dL — ABNORMAL HIGH (ref 70–99)
Glucose-Capillary: 103 mg/dL — ABNORMAL HIGH (ref 70–99)
Glucose-Capillary: 295 mg/dL — ABNORMAL HIGH (ref 70–99)

## 2023-11-03 LAB — CULTURE, BLOOD (ROUTINE X 2)

## 2023-11-03 NOTE — Progress Notes (Signed)
 Progress Note   Patient: Carolyn Norton ZOX:[id number] DOB: 03-Jun-1966 DOA: 10/30/2023     4 DOS: the patient was seen and examined on 11/03/2023   Brief hospital course: 58 year old woman with PMH of DM2, endometrial cancer low grade, NPH s/p VP shunt, bipolar disorder who presented to [facility] ED. Patient found down at home in vomit. Found to be in shock state with DKA.  Diagnosed with septic shock, intubated and admitted in the ICU. Patient was on pressor support which was weaned off gradually. Patient was extubated on 10/31/2023.  Assessment and Plan:  # Septic shock due to Proteus bacteremia Shock resolved.  Off vasopressors. S epidermidis only isolated from 1 of 2 cultures, likely a contaminant. - Continue Rocephin.  Organism is sensitive to ciprofloxacin and patient can be discharged on ciprofloxacin to complete a 7-day course with end date 11/06/2023.   # Poorly controlled type 1 diabetes # Diabetic ketoacidosis, resolved SLP eval done, patient was started on dysphagia 3 diet with thin liquids On Lantus 25 u BID and sliding scale insulin. Patient hemoglobin A1c > 12.  Diabetes education.   # Endotracheally intubated, successfully extubated 2/23 Acute kidney injury due to septic ATN, improving Avoid nephrotoxic drugs. Renal dosing of medications, as needed. Monitor urine output.   # Mixed metabolic/septic encephalopathy, unclear baseline Restart home medications (Depakote, Remeron, trazodone). Lyrica is still on hold, resume when needed   # Demand cardiac ischemia Patient serum troponins are slightly elevated, echocardiogram showed no wall motion abnormalities.   # Hyperlipidemia Resume atorvastatin.   # Hypertension Presented with septic shock, off pressure support now 2/25 BP elevated, hypotensive, resumed amlodipine 5 mg p.o. daily and metoprolol 12.5 mg p.o. twice daily home dose with holding parameters. - Monitor BP and titrate medications accordingly.   #  Depression -Continue Remeron and Depakote home dose -Continue supportive care   # Hypokalemia - Will replete as needed.    # Obesity, class I Body mass index is 32.73 kg/m.  - For outpatient follow up.   # Deconditioning -Patient will discharge to SNF.     Subjective: Patient states she is feeling better and is "ready to go home".  Physical Exam: Vitals:   11/03/23 0422 11/03/23 0552 11/03/23 0745 11/03/23 1017  BP: (!) 149/73  (!) 154/86 (!) 157/84  Pulse: 86  81 82  Resp: 18  18   Temp: 98 F (36.7 C)  98 F (36.7 C)   TempSrc: Oral     SpO2: 99%  97%   Weight:  86.4 kg    Height:         General: Alert, oriented X3  Eyes: Pupils equal, reactive  Oral cavity: moist mucous membranes  Head: Atraumatic, normocephalic  Neck: supple  Chest: clear to auscultation. No crackles, no wheezes  CVS: S1,S2 RRR. No murmurs  Abd: No distention, soft, non-tender. No masses palpable  Extr: No edema   MSK: No joint deformities or swelling  Neurological: Grossly intact.    Data Reviewed:      Latest Ref Rng & Units 11/02/2023    6:34 AM 11/01/2023    4:29 AM 10/31/2023    4:10 AM  CBC  WBC 4.0 - 10.5 K/uL 8.9  14.4  21.3   Hemoglobin 12.0 - 15.0 g/dL 81.1  91.4  78.2   Hematocrit 36.0 - 46.0 % 35.9  33.1  34.6   Platelets 150 - 400 K/uL 177  184  295       Latest  Ref Rng & Units 11/02/2023    6:34 AM 11/01/2023    4:29 AM 10/31/2023    4:10 AM  BMP  Glucose 70 - 99 mg/dL 161  096  045   BUN 6 - 20 mg/dL 17  27  44   Creatinine 0.44 - 1.00 mg/dL 4.09  8.11  9.14   Sodium 135 - 145 mmol/L 141  143  139   Potassium 3.5 - 5.1 mmol/L 3.7  3.2  4.0   Chloride 98 - 111 mmol/L 112  108  101   CO2 22 - 32 mmol/L 22  26  24    Calcium 8.9 - 10.3 mg/dL 8.4  8.5  8.9      Family Communication: n/a  Disposition: Status is: Inpatient Remains inpatient appropriate because: Receiving IV antibiotics for Proteus bacteremia  Planned Discharge Destination: Skilled nursing  facility    Time spent: 35 minutes  Author: Marcine Matar, MD 11/03/2023 2:32 PM  For on call review [email or web address].

## 2023-11-03 NOTE — Telephone Encounter (Signed)
 Pt's aunt, [name], called stating [name] is currently in the hospital at [facility]. Reports last week there was an issue with insulin and diabetic medications and on Saturday 2/22 [name] was on life support. She is no longer on life support but pt will no longer be able to go home they are looking for a long term facility.   Appointment that was scheduled for Monday 3/3 has been cancelled. She will call back to reschedule if and when the patient is stable enough to have surgery.

## 2023-11-03 NOTE — TOC Progression Note (Signed)
 Transition of Care [facility]) - Progression Note    Patient Details  Name: Carolyn Norton MRN: [id number] Date of Birth: 1966-08-30  Transition of [facility]) CM/SW Contact  Schylar Wuebker A [place], LCSW Phone Number: 11/03/2023, 4:46 PM  Clinical Narrative:     CSW met with pt at bedside, she stated she was agreeable to placement at [facility] [place] if she could go to private room. CSW explained that pt would not be able to maintain private room if there longer than 20 days for short term, she stated that if that happened she would be ok with it, as long as she could start in a private room.  CSW to update facility to see if pt can accommodate if not,new placement will need to be explored. Pt estimated DC tomorrow.    TOC will continue to follow.   Expected Discharge Plan: Skilled Nursing Facility Barriers to Discharge: Continued Medical Work up, SNF Pending bed offer  Expected Discharge Plan and Services       Living arrangements for the past 2 months: Single Family Home                                       Social Determinants of Health (SDOH) Interventions SDOH Screenings   Food Insecurity: Patient Unable To Answer (10/30/2023)  Housing: Patient Unable To Answer (10/30/2023)  Transportation Needs: Patient Unable To Answer (10/30/2023)  Utilities: Patient Unable To Answer (10/30/2023)  Depression (PHQ2-9): Low Risk  (07/24/2020)  Tobacco Use: High Risk (09/22/2023)    Readmission Risk Interventions     No data to display

## 2023-11-03 NOTE — Plan of Care (Signed)

## 2023-11-03 NOTE — Progress Notes (Signed)
 Physical Therapy Treatment Patient Details Name: Carolyn Norton MRN: [id number] DOB: 07-27-1966 Today's Date: 11/03/2023   History of Present Illness Pt is 58 year old presented to [facility] on  10/30/23 for AMS after found down at home. Pt hypotensive, hypoxic and vomiting. Pt intubated in ED. Pt found to have severe sepsis with septic shock due to Proteus/S epi bacteremia, POA and DKA. Pt had run out of insulin a week prior. PMH - NPH s/p VP shunt, DM, bipolar, endometrial CA, HTN, obesity.    PT Comments  Pt refusing to ambulate today and unable to state reason other than "I am not going to do it." With great encouragement convinced pt to get up to recliner. Currently pt self limiting for mobility.     If plan is discharge home, recommend the following: A little help with walking and/or transfers;A little help with bathing/dressing/bathroom;Assistance with cooking/housework;Direct supervision/assist for medications management;Assist for transportation;Help with stairs or ramp for entrance   Can travel by private vehicle     Yes  Equipment Recommendations  Other (comment) (To be determined)    Recommendations for Other Services       Precautions / Restrictions Precautions Precautions: Fall;Other (comment)     Mobility  Bed Mobility Overal bed mobility: Needs Assistance Bed Mobility: Supine to Sit     Supine to sit: Contact guard, HOB elevated, Used rails          Transfers Overall transfer level: Needs assistance Equipment used: None Transfers: Sit to/from Stand, Bed to chair/wheelchair/BSC Sit to Stand: Min assist   Step pivot transfers: Min assist       General transfer comment: Assist to power up and for balance stepping to chair    Ambulation/Gait               General Gait Details: Pt adamantly refusing   Stairs             Wheelchair Mobility     Tilt Bed    Modified Rankin (Stroke Patients Only)       Balance Overall balance  assessment: Needs assistance Sitting-balance support: No upper extremity supported, Feet supported Sitting balance-Leahy Scale: Fair     Standing balance support: Single extremity supported, During functional activity Standing balance-Leahy Scale: Poor Standing balance comment: UE support                            Communication Communication Communication: No apparent difficulties  Cognition Arousal: Alert Behavior During Therapy: Impulsive   PT - Cognitive impairments: No family/caregiver present to determine baseline                       PT - Cognition Comments: Reluctant to participate Following commands: Impaired Following commands impaired: Follows one step commands with increased time    Cueing Cueing Techniques: Verbal cues, Tactile cues  Exercises      General Comments        Pertinent Vitals/Pain Pain Assessment Pain Assessment: No/denies pain    Home Living                          Prior Function            PT Goals (current goals can now be found in the care plan section) Acute Rehab PT Goals Patient Stated Goal: go home Progress towards PT goals: Not progressing toward goals - comment  Frequency    Min 1X/week      PT Plan      Co-evaluation              AM-PAC PT "6 Clicks" Mobility   Outcome Measure  Help needed turning from your back to your side while in a flat bed without using bedrails?: A Little Help needed moving from lying on your back to sitting on the side of a flat bed without using bedrails?: A Little Help needed moving to and from a bed to a chair (including a wheelchair)?: A Little Help needed standing up from a chair using your arms (e.g., wheelchair or bedside chair)?: A Little Help needed to walk in hospital room?: A Little Help needed climbing 3-5 steps with a railing? : Total 6 Click Score: 16    End of Session   Activity Tolerance: Other (comment) (Pt self  limiting) Patient left: in chair;with call bell/phone within reach;with chair alarm set Nurse Communication: Mobility status PT Visit Diagnosis: Unsteadiness on feet (R26.81);Other abnormalities of gait and mobility (R26.89);Muscle weakness (generalized) (M62.81)     Time: 2952-8413 PT Time Calculation (min) (ACUTE ONLY): 15 min  Charges:    $Gait Training: 8-22 mins PT General Charges $$ ACUTE PT VISIT: 1 Visit                     [facility] PT Acute Rehabilitation Services Office [phone]    Angelina Ok [facility] 11/03/2023, 4:17 PM

## 2023-11-04 LAB — CULTURE, BLOOD (ROUTINE X 2): Culture: NO GROWTH

## 2023-11-04 LAB — BASIC METABOLIC PANEL
Anion gap: 13 (ref 5–15)
BUN: 14 mg/dL (ref 6–20)
CO2: 21 mmol/L — ABNORMAL LOW (ref 22–32)
Calcium: 8.4 mg/dL — ABNORMAL LOW (ref 8.9–10.3)
Chloride: 106 mmol/L (ref 98–111)
Creatinine, Ser: 1.07 mg/dL — ABNORMAL HIGH (ref 0.44–1.00)
GFR, Estimated: >60 mL/min (ref >60)
Glucose, Bld: 47 mg/dL — ABNORMAL LOW (ref 70–99)
Potassium: 3.4 mmol/L — ABNORMAL LOW (ref 3.5–5.1)
Sodium: 140 mmol/L (ref 135–145)

## 2023-11-04 LAB — GLUCOSE, CAPILLARY
Glucose-Capillary: 41 mg/dL — CL (ref 70–99)
Glucose-Capillary: 113 mg/dL — ABNORMAL HIGH (ref 70–99)
Glucose-Capillary: 257 mg/dL — ABNORMAL HIGH (ref 70–99)
Glucose-Capillary: 115 mg/dL — ABNORMAL HIGH (ref 70–99)
Glucose-Capillary: 250 mg/dL — ABNORMAL HIGH (ref 70–99)
Glucose-Capillary: 216 mg/dL — ABNORMAL HIGH (ref 70–99)

## 2023-11-04 LAB — CBC
HCT: 35.9 % — ABNORMAL LOW (ref 36.0–46.0)
Hemoglobin: 12.1 g/dL (ref 12.0–15.0)
MCH: 28.1 pg (ref 26.0–34.0)
MCHC: 33.7 g/dL (ref 30.0–36.0)
MCV: 83.5 fL (ref 80.0–100.0)
Platelets: 198 10*3/uL (ref 150–400)
RBC: 4.3 MIL/uL (ref 3.87–5.11)
RDW: 14.5 % (ref 11.5–15.5)
WBC: 6.9 10*3/uL (ref 4.0–10.5)
nRBC: 0.3 % — ABNORMAL HIGH (ref 0.0–0.2)

## 2023-11-04 LAB — MAGNESIUM: Magnesium: 1.7 mg/dL (ref 1.7–2.4)

## 2023-11-04 LAB — PHOSPHORUS: Phosphorus: 3.3 mg/dL (ref 2.5–4.6)

## 2023-11-04 MED ORDER — ALBUTEROL SULFATE (2.5 MG/3ML) 0.083% IN NEBU
3.0000 mL | INHALATION_SOLUTION | Freq: Four times a day (QID) | RESPIRATORY_TRACT | Status: DC | PRN
  Filled 2023-11-04: qty 3

## 2023-11-04 MED ORDER — INSULIN GLARGINE 100 UNIT/ML ~~LOC~~ SOLN
25.0000 [IU] | Freq: Every day | SUBCUTANEOUS | Status: DC
  Filled 2023-11-04: qty 0.25

## 2023-11-04 MED ORDER — CIPROFLOXACIN HCL 750 MG PO TABS
750.0000 mg | ORAL_TABLET | Freq: Two times a day (BID) | ORAL | Status: DC
  Administered 2023-11-04 (×2): 750 mg via ORAL
  Filled 2023-11-04 (×5): qty 1

## 2023-11-04 MED ORDER — INSULIN GLARGINE 100 UNIT/ML ~~LOC~~ SOLN
15.0000 [IU] | Freq: Every day | SUBCUTANEOUS | Status: DC
  Administered 2023-11-04: 15 [IU] via SUBCUTANEOUS
  Filled 2023-11-04 (×2): qty 0.15

## 2023-11-04 NOTE — Plan of Care (Signed)

## 2023-11-04 NOTE — Progress Notes (Signed)
 Progress Note   Patient: Carolyn Norton WUJ:[id number] DOB: June 01, 1966 DOA: 10/30/2023     5 DOS: the patient was seen and examined on 11/04/2023   Brief hospital course: 58 year old woman with PMH of DM2, endometrial cancer low grade, NPH s/p VP shunt, bipolar disorder who presented to [facility] ED. Patient found down at home in vomit. Found to be in shock state with DKA.  Diagnosed with septic shock, intubated and admitted in the ICU. Patient was on pressor support which was weaned off gradually. Patient was extubated on 10/31/2023.  Assessment and Plan:  # Septic shock due to Proteus bacteremia Shock resolved.  Off vasopressors. S epidermidis only isolated from 1 of 2 cultures, likely a contaminant. Received Zosyn, then Rocephin for total of 5 days.  - Transitioned to ciprofloxacin, based on sensitivities, to complete a 7-day course with end date 11/06/2023.   # Poorly controlled type 1 diabetes # Diabetic ketoacidosis, resolved SLP eval done, patient was started on dysphagia 3 diet with thin liquids On Lantus 25 u BID and sliding scale insulin. Patient hemoglobin A1c > 12.  Diabetes education. - Given hypoglycemia on 2/27, insulins adjusted.    # Endotracheally intubated, successfully extubated 2/23 Acute kidney injury due to septic ATN, improving Avoid nephrotoxic drugs. Renal dosing of medications, as needed. Monitor urine output.   # Mixed metabolic/septic encephalopathy, unclear baseline Restart home medications (Depakote, Remeron, trazodone). Lyrica is still on hold, resume when needed   # Demand cardiac ischemia Patient serum troponins are slightly elevated, echocardiogram showed no wall motion abnormalities.   # Hyperlipidemia Resume atorvastatin.   # Hypertension Presented with septic shock, off pressure support now 2/25 BP elevated, hypotensive, resumed amlodipine 5 mg p.o. daily and metoprolol 12.5 mg p.o. twice daily home dose with holding parameters. - Monitor  BP and titrate medications accordingly.   # Depression -Continue Remeron and Depakote home dose -Continue supportive care   # Hypokalemia - Will replete as needed.    # Obesity, class I Body mass index is 32.73 kg/m.  - For outpatient follow up.   # Deconditioning -Patient will discharge to SNF.     Subjective: Patient was hypoglycemic this morning.  Insulins adjusted.  Physical Exam: Vitals:   11/03/23 2013 11/04/23 0417 11/04/23 0715 11/04/23 1520  BP: 119/62 (!) 142/75 (!) 140/81 129/79  Pulse: 94 84 88 89  Resp: 18 18 18 18   Temp: 97.7 F (36.5 C) 98 F (36.7 C) 98 F (36.7 C)   TempSrc: Oral Oral    SpO2: 96% 96% 97% 98%  Weight:  90.1 kg    Height:         General: Alert, oriented X3  Eyes: Pupils equal, reactive  Oral cavity: moist mucous membranes  Head: Atraumatic, normocephalic  Neck: supple  Chest: clear to auscultation. No crackles, no wheezes  CVS: S1,S2 RRR. No murmurs  Abd: No distention, soft, non-tender. No masses palpable  Extr: No edema   MSK: No joint deformities or swelling  Neurological: Grossly intact.    Data Reviewed:      Latest Ref Rng & Units 11/04/2023    7:14 AM 11/02/2023    6:34 AM 11/01/2023    4:29 AM  CBC  WBC 4.0 - 10.5 K/uL 6.9  8.9  14.4   Hemoglobin 12.0 - 15.0 g/dL 95.6  21.3  08.6   Hematocrit 36.0 - 46.0 % 35.9  35.9  33.1   Platelets 150 - 400 K/uL 198  177  184  Latest Ref Rng & Units 11/04/2023    7:14 AM 11/02/2023    6:34 AM 11/01/2023    4:29 AM  BMP  Glucose 70 - 99 mg/dL 47  098  119   BUN 6 - 20 mg/dL 14  17  27    Creatinine 0.44 - 1.00 mg/dL 1.47  8.29  5.62   Sodium 135 - 145 mmol/L 140  141  143   Potassium 3.5 - 5.1 mmol/L 3.4  3.7  3.2   Chloride 98 - 111 mmol/L 106  112  108   CO2 22 - 32 mmol/L 21  22  26    Calcium 8.9 - 10.3 mg/dL 8.4  8.4  8.5      Family Communication: n/a  Disposition: Status is: Inpatient Remains inpatient appropriate because: Developed hypoglycemia and  required further adjustments to insulin dose.  Planned Discharge Destination: Skilled nursing facility DVT prophylaxis: SQ heparin     Time spent: 35 minutes  Author: MDALA-GAUSI, Gwenette Greet, MD 11/04/2023 5:08 PM  For on call review [email or web address].

## 2023-11-04 NOTE — Progress Notes (Signed)
 Mobility Specialist: Progress Note   11/04/23 1614  Mobility  Activity Ambulated with assistance in hallway  Level of Assistance Contact guard assist, steadying assist  Assistive Device Front wheel walker  Distance Ambulated (ft) 150 ft  Activity Response Tolerated well  Mobility Referral Yes  Mobility visit 1 Mobility  Mobility Specialist Start Time (ACUTE ONLY) 1515  Mobility Specialist Stop Time (ACUTE ONLY) 1534  Mobility Specialist Time Calculation (min) (ACUTE ONLY) 19 min    Pt was agreeable to mobility session - received in bed. CG throughout. Took 1x standing break d/t fatigue and slight dizziness. MS also observed pt fatigue accompanied with shaking. Returned to room without fault. Left in bed with all needs met, call bell in reach.   Maurene Capes Mobility Specialist Please contact via SecureChat or Rehab office at [phone]

## 2023-11-04 NOTE — Progress Notes (Signed)
 Mobility Specialist: Progress Note   11/04/23 1233  Mobility  Activity Ambulated with assistance in hallway  Level of Assistance Contact guard assist, steadying assist  Assistive Device Front wheel walker  Distance Ambulated (ft) 75 ft  Activity Response Tolerated well  Mobility Referral Yes  Mobility visit 1 Mobility  Mobility Specialist Start Time (ACUTE ONLY) 1210  Mobility Specialist Stop Time (ACUTE ONLY) 1227  Mobility Specialist Time Calculation (min) (ACUTE ONLY) 17 min    Pt was agreeable to mobility session - received in bed. CG throughout. No complaints. Required min verbal cues for steering RW. Left in bed with all needs met, call bell in reach.   Maurene Capes Mobility Specialist Please contact via SecureChat or Rehab office at [phone]

## 2023-11-04 NOTE — Inpatient Diabetes Management (Signed)
 Inpatient Diabetes Program Recommendations  AACE/ADA: New Consensus Statement on Inpatient Glycemic Control (2015)  Target Ranges:  Prepandial:   less than 140 mg/dL      Peak postprandial:   less than 180 mg/dL (1-2 hours)      Critically ill patients:  140 - 180 mg/dL   Lab Results  Component Value Date   GLUCAP 113 (H) 11/04/2023   HGBA1C 12.3 (H) 09/06/2023  Diabetes history: DM2 Outpatient Diabetes medications: Lantus 30 units am, 15 units hs, Amaryl 2 mg daily, Trulicity 3 mg weekly (not Current orders for Inpatient glycemic control: Lantus 25 units bid, Novolog 0-20 units tid, 0-5 units hs  Diabetes history: DM2 Outpatient Diabetes medications: Lantus 30 units am, 15 units hs, Amaryl 2 mg daily, Trulicity 3 mg weekly (not Current orders for Inpatient glycemic control: Lantus 25 units bid, Novolog 0-20 units tid, 0-5 units hs  Inpatient Diabetes Program Recommendations:   Fasting hypoglycemic @ 41. Please consider: -Decrease Lantus pm dose to 20 units  Thank you, Darel Hong E. Emmanuel Ercole, RN, MSN, CDCES  Diabetes Coordinator Inpatient Glycemic Control Team Team Pager [phone] (8am-5pm) 11/04/2023 12:03 PM

## 2023-11-04 NOTE — TOC Progression Note (Signed)
 Transition of Care [facility]) - Progression Note    Patient Details  Name: Carolyn Norton MRN: [id number] Date of Birth: [birth year]  Transition of Care [facility]) CM/SW Contact  Meghann Landing A [place], LCSW Phone Number: 11/04/2023, 1:56 PM  Clinical Narrative:     CSW spoke with pt at bedside to get update on bed offers and get decision on placement. She stated that she had not looked at the bed offers that were given with medicare ratings. She stated that she just wanted to go home. CSW explained that pt's father was currently unable to care for pt at home and she stated she was aware. She said that she was "fine with [facility]" and was agreeable to go there for placement.   CSW reached out to facility, they confirmed bed was available today. CSW notified provider of possible DC, informed CSW that pt needs continued medical care.   CSW reached out to pt's aunt Dewayne Hatch and pt's father to inform them of possible DC and ability to complete admission paperwork. CSW informed them that the facility would be reaching out about paperwork within a day or so.     TOC will continue to follow.   Expected Discharge Plan: Skilled Nursing Facility Barriers to Discharge: Continued Medical Work up, SNF Pending bed offer  Expected Discharge Plan and Services       Living arrangements for the past 2 months: Single Family Home                                       Social Determinants of Health (SDOH) Interventions SDOH Screenings   Food Insecurity: Patient Unable To Answer (10/30/2023)  Housing: Patient Unable To Answer (10/30/2023)  Transportation Needs: Patient Unable To Answer (10/30/2023)  Utilities: Patient Unable To Answer (10/30/2023)  Depression (PHQ2-9): Low Risk  (07/24/2020)  Tobacco Use: High Risk (09/22/2023)    Readmission Risk Interventions     No data to display

## 2023-11-05 LAB — GLUCOSE, CAPILLARY
Glucose-Capillary: 71 mg/dL (ref 70–99)
Glucose-Capillary: 145 mg/dL — ABNORMAL HIGH (ref 70–99)

## 2023-11-05 MED ORDER — LANTUS 100 UNIT/ML ~~LOC~~ SOLN: 15.0000 [IU] | SUBCUTANEOUS | Status: DC

## 2023-11-05 MED ORDER — INSULIN ASPART 100 UNIT/ML IJ SOLN
0.0000 [IU] | Freq: Three times a day (TID) | INTRAMUSCULAR | Status: DC
Start: 2023-11-05 — End: 2024-01-18

## 2023-11-05 MED ORDER — CIPROFLOXACIN HCL 750 MG PO TABS: 750.0000 mg | ORAL_TABLET | Freq: Two times a day (BID) | ORAL | Status: AC

## 2023-11-05 NOTE — TOC Transition Note (Signed)
 Transition of Care [facility]) - Discharge Note   Patient Details  Name: Carolyn Norton MRN: [id number] Date of Birth: 1966/08/12  Transition of Care [facility]) CM/SW Contact:  Kearah Gayden A [place], LCSW Phone Number: 11/05/2023, 2:18 PM   Clinical Narrative:     Patient will DC to: [facility]  Anticipated DC date: 11/05/23  Family notified: Harmon Dun  Transport by: Sharin Mons       Per MD patient ready for DC to  [facility] RN, patient, patient's family, and facility notified of DC. Discharge Summary and FL2 sent to facility. RN to call report prior to discharge [phone], room 127b). DC packet on chart. Ambulance transport requested for patient.     CSW will sign off for now as social work intervention is no longer needed. Please consult [place] again if new needs arise.   Final next level of care: Skilled Nursing Facility Barriers to Discharge: Barriers Resolved   Patient Goals and CMS Choice Patient states their goals for this hospitalization and ongoing recovery are:: go to snf          Discharge Placement              Patient chooses bed at:  ([facility]) Patient to be transferred to facility by: [facility] Name of family member notified: biana haggar Patient and family notified of of transfer: 11/05/23  Discharge Plan and Services Additional resources added to the After Visit Summary for                                       Social Drivers of Health (SDOH) Interventions SDOH Screenings   Food Insecurity: Patient Unable To Answer (10/30/2023)  Housing: Patient Unable To Answer (10/30/2023)  Transportation Needs: Patient Unable To Answer (10/30/2023)  Utilities: Patient Unable To Answer (10/30/2023)  Depression (PHQ2-9): Low Risk  (07/24/2020)  Tobacco Use: High Risk (09/22/2023)     Readmission Risk Interventions     No data to display

## 2023-11-05 NOTE — Progress Notes (Signed)
 Report has been called to [facility]

## 2023-11-05 NOTE — Progress Notes (Signed)
 Made MD aware pt is refusing labs,   MD states pt will be getting discharged today to go to SNF

## 2023-11-05 NOTE — Discharge Summary (Signed)
 Physician Discharge Summary   Patient: Carolyn Norton MRN: [id number] DOB: [birth year]  Admit date:     10/30/2023  Discharge date: 11/05/23  Discharge Physician: MDALA-GAUSI, Gwenette Greet   PCP: Carin Hock, PA   Recommendations at discharge:    Follow up with PCP.  Discharge Diagnoses: Principal Problem:   Hyperkalemia  Resolved Problems:   * No resolved hospital problems. *  Hospital Course: 58 year old woman with PMH of DM2, endometrial cancer low grade, NPH s/p VP shunt, bipolar disorder who presented to [facility] ED. Patient found down at home in vomit. Found to be in shock state with DKA.   Diagnosed with septic shock, intubated and admitted in the ICU. Patient was on pressor support which was weaned off gradually. Patient was extubated on 10/31/2023  The hospital course is in problem-based format below:    # Septic shock due to Proteus bacteremia Shock resolved.  Off vasopressors. Blood cultures grew Proteus as well as CoNS.  S epidermidis only isolated from 1 of 2 cultures, likely a contaminant. Received Zosyn, then Rocephin for total of 5 days.  Transitioned to ciprofloxacin, based on sensitivities, to complete a 7-day course with end date 11/06/2023.   # Poorly controlled type 1 diabetes # Diabetic ketoacidosis, resolved SLP eval done, patient was started on dysphagia 3 diet with thin liquids On Lantus 25 u am and 15 u pm and sliding scale insulin. Patient hemoglobin A1c > 12.    # Acute kidney injury due to septic ATN, improving Baseline creatinine approx 1 Presented with creatinine of 4.32.  Renal function is now back to baseline (creatinine on 11/04/23 was 1.07).   # Mixed metabolic/septic encephalopathy, unclear baseline Mental status returned to baseline.    # Demand cardiac ischemia Patient serum troponins were slightly elevated, echocardiogram showed no wall motion abnormalities. Likely non-ischemic myocardial injury due to sepsis.    #  Hyperlipidemia Resumed atorvastatin.   # Hypertension Presented with septic shock, initially requiring pressor support.  2/25 BP elevated, hypotensive, resumed amlodipine 5 mg p.o. daily and metoprolol 12.5 mg p.o. twice daily home dose with holding parameters.   # Depression Continued Remeron and Depakote home dose   # Obesity, class I Body mass index is 33.41 kg/m.  For outpatient follow up.    # Deconditioning Patient discharging to SNF.      Consultants: n/a Procedures performed: n/a  Disposition: Skilled nursing facility Diet recommendation:  Carb modified diet DISCHARGE MEDICATION: Allergies as of 11/05/2023       Reactions   Erythromycin Other (See Comments)   Childhood reaction        Medication List     STOP taking these medications    glimepiride 2 MG tablet Commonly known as: AMARYL   Trulicity 3 MG/0.5ML Soaj Generic drug: Dulaglutide       TAKE these medications    albuterol 108 (90 Base) MCG/ACT inhaler Commonly known as: VENTOLIN HFA Inhale 1-2 puffs into the lungs every 6 (six) hours as needed for wheezing or shortness of breath.   amLODipine 5 MG tablet Commonly known as: NORVASC Take 1 tablet (5 mg total) by mouth daily.   atorvastatin 20 MG tablet Commonly known as: LIPITOR Take 1 tablet (20 mg total) by mouth daily. What changed: when to take this   ciprofloxacin 750 MG tablet Commonly known as: CIPRO Take 1 tablet (750 mg total) by mouth 2 (two) times daily for 2 doses.   D3 PO Take 1 tablet by mouth daily.  divalproex 250 MG DR tablet Commonly known as: DEPAKOTE Take 250 mg by mouth 3 (three) times daily.   empagliflozin 10 MG Tabs tablet Commonly known as: JARDIANCE Take 10 mg by mouth daily.   insulin aspart 100 UNIT/ML injection Commonly known as: novoLOG Inject 0-20 Units into the skin 3 (three) times daily with meals. CBG 70 - 120: 0 units CBG 121 - 150: 0 units CBG 151 - 200: 0 units CBG 201 - 250: 2 units  CBG 251 - 300: 3 units CBG 301 - 350: 4 units CBG 351 - 400: 5 units CBG > 400: call MD and obtain STAT lab verification   Lantus 100 UNIT/ML injection Generic drug: insulin glargine Inject 0.15-0.3 mLs (15-30 Units total) into the skin See admin instructions. Inject 25 units into the skin morning and 15 units at bedtime. What changed: additional instructions   levalbuterol 45 MCG/ACT inhaler Commonly known as: XOPENEX HFA Inhale 2 puffs into the lungs every 4 (four) hours as needed for wheezing or shortness of breath.   megestrol 40 MG tablet Commonly known as: MEGACE Take 2 tablets (80 mg total) by mouth 2 (two) times daily.   metoprolol tartrate 25 MG tablet Commonly known as: LOPRESSOR Take 12.5 mg by mouth 2 (two) times daily.   mirtazapine 15 MG tablet Commonly known as: REMERON Take 15 mg by mouth at bedtime.   multivitamin with minerals tablet Take 1 tablet by mouth daily.   OVER THE COUNTER MEDICATION Take 1 tablet by mouth 2 (two) times daily as needed (Cough/Cold/Congestion).   pantoprazole 40 MG tablet Commonly known as: PROTONIX Take 40 mg by mouth every morning.   polyethylene glycol 17 g packet Commonly known as: MIRALAX / GLYCOLAX Take 17 g by mouth daily. What changed:  when to take this reasons to take this   pregabalin 25 MG capsule Commonly known as: LYRICA Take 75 mg by mouth 2 (two) times daily as needed (Pain).   traZODone 50 MG tablet Commonly known as: DESYREL Take 50 mg by mouth at bedtime as needed for sleep.        Contact information for after-discharge care     Destination     [facility] .   Service: Skilled Nursing Contact information: [address] [place] [address] [phone]                    Discharge Exam: Filed Weights   11/03/23 0552 11/04/23 0417 11/05/23 0400  Weight: 86.4 kg 90.1 kg 88.3 kg   Physical Exam on Day of Discharge   General: Alert, cheerful, oriented  X2  Oral cavity: moist mucous membranes  Neck: supple  Chest: clear to auscultation. No crackles, no wheezes  CVS: S1,S2 RRR. No murmurs  Abd: No distention, soft, non-tender. No masses palpable  Extr: No edema    Condition at discharge: stable  The results of significant diagnostics from this hospitalization (including imaging, microbiology, ancillary and laboratory) are listed below for reference.   Imaging Studies: ECHOCARDIOGRAM COMPLETE Result Date: 10/30/2023    ECHOCARDIOGRAM REPORT   Patient Name:   SAYRA FRISBY Date of Exam: 10/30/2023 Medical Rec #:  [id number]          Height:       64.0 in Accession #:    [id number]         Weight:       183.4 lb Date of Birth:  [birth year]  BSA:          1.886 m Patient Age:    57 years           BP:           92/49 mmHg Patient Gender: F                  HR:           107 bpm. Exam Location:  Inpatient Procedure: 2D Echo, Cardiac Doppler, Color Doppler and Intracardiac            Opacification Agent (Both Spectral and Color Flow Doppler were            utilized during procedure). Indications:    Shock  History:        Patient has prior history of Echocardiogram examinations, most                 recent 04/08/2021. Risk Factors:Diabetes and Current Smoker.  Sonographer:    Karma Ganja Referring Phys: Lorin Glass  Sonographer Comments: Technically difficult study due to poor echo windows, echo performed with patient supine and on artificial respirator and patient is obese. IMPRESSIONS  1. Left ventricular ejection fraction, by estimation, is 65 to 70%. The left ventricle has normal function. The left ventricle has no regional wall motion abnormalities. There is moderate left ventricular hypertrophy. Left ventricular diastolic parameters are consistent with Grade I diastolic dysfunction (impaired relaxation).  2. Right ventricular systolic function is normal. The right ventricular size is normal.  3. The mitral valve is abnormal. No evidence of  mitral valve regurgitation. No evidence of mitral stenosis. Moderate mitral annular calcification.  4. The aortic valve is tricuspid. There is mild calcification of the aortic valve. There is mild thickening of the aortic valve. Aortic valve regurgitation is not visualized. Aortic valve sclerosis is present, with no evidence of aortic valve stenosis.  5. The inferior vena cava is normal in size with greater than 50% respiratory variability, suggesting right atrial pressure of 3 mmHg. FINDINGS  Left Ventricle: Left ventricular ejection fraction, by estimation, is 65 to 70%. The left ventricle has normal function. The left ventricle has no regional wall motion abnormalities. Definity contrast agent was given IV to delineate the left ventricular  endocardial borders. Strain imaging was not performed. The left ventricular internal cavity size was normal in size. There is moderate left ventricular hypertrophy. Left ventricular diastolic parameters are consistent with Grade I diastolic dysfunction (impaired relaxation). Right Ventricle: The right ventricular size is normal. No increase in right ventricular wall thickness. Right ventricular systolic function is normal. Left Atrium: Left atrial size was normal in size. Right Atrium: Right atrial size was normal in size. Pericardium: There is no evidence of pericardial effusion. Mitral Valve: The mitral valve is abnormal. There is mild thickening of the mitral valve leaflet(s). There is mild calcification of the mitral valve leaflet(s). Moderate mitral annular calcification. No evidence of mitral valve regurgitation. No evidence  of mitral valve stenosis. MV peak gradient, 11.3 mmHg. The mean mitral valve gradient is 5.0 mmHg. Tricuspid Valve: The tricuspid valve is normal in structure. Tricuspid valve regurgitation is mild . No evidence of tricuspid stenosis. Aortic Valve: The aortic valve is tricuspid. There is mild calcification of the aortic valve. There is mild  thickening of the aortic valve. Aortic valve regurgitation is not visualized. Aortic valve sclerosis is present, with no evidence of aortic valve stenosis. Aortic valve mean gradient measures 20.7 mmHg. Aortic  valve peak gradient measures 35.6 mmHg. Aortic valve area, by VTI measures 2.43 cm. Pulmonic Valve: The pulmonic valve was normal in structure. Pulmonic valve regurgitation is trivial. No evidence of pulmonic stenosis. Aorta: The aortic root is normal in size and structure. Venous: The inferior vena cava is normal in size with greater than 50% respiratory variability, suggesting right atrial pressure of 3 mmHg. IAS/Shunts: No atrial level shunt detected by color flow Doppler. Additional Comments: 3D imaging was not performed.  LEFT VENTRICLE PLAX 2D LVIDd:         3.30 cm   Diastology LVIDs:         1.90 cm   LV e' medial:    6.42 cm/s LV PW:         1.30 cm   LV E/e' medial:  15.0 LV IVS:        1.40 cm   LV e' lateral:   8.70 cm/s LVOT diam:     2.00 cm   LV E/e' lateral: 11.1 LV SV:         118 LV SV Index:   63 LVOT Area:     3.14 cm  RIGHT VENTRICLE             IVC RV S prime:     13.20 cm/s  IVC diam: 2.10 cm TAPSE (M-mode): 2.9 cm LEFT ATRIUM             Index LA diam:        2.70 cm 1.43 cm/m LA Vol (A2C):   33.6 ml 17.82 ml/m LA Vol (A4C):   44.6 ml 23.65 ml/m LA Biplane Vol: 39.0 ml 20.68 ml/m  AORTIC VALVE AV Area (Vmax):    2.51 cm AV Area (Vmean):   2.73 cm AV Area (VTI):     2.43 cm AV Vmax:           298.33 cm/s AV Vmean:          206.333 cm/s AV VTI:            0.486 m AV Peak Grad:      35.6 mmHg AV Mean Grad:      20.7 mmHg LVOT Vmax:         238.50 cm/s LVOT Vmean:        179.000 cm/s LVOT VTI:          0.376 m LVOT/AV VTI ratio: 0.77  AORTA Ao Root diam: 2.80 cm MITRAL VALVE MV Area (PHT): 3.60 cm     SHUNTS MV Area VTI:   4.17 cm     Systemic VTI:  0.38 m MV Peak grad:  11.3 mmHg    Systemic Diam: 2.00 cm MV Mean grad:  5.0 mmHg MV Vmax:       1.68 m/s MV Vmean:      102.0 cm/s  MV Decel Time: 211 msec MV E velocity: 96.40 cm/s MV A velocity: 140.00 cm/s MV E/A ratio:  0.69 Charlton Haws MD Electronically signed by Charlton Haws MD Signature Date/Time: 10/30/2023/4:31:05 PM    Final    CT HEAD WO CONTRAST ( ) Result Date: 10/30/2023 CLINICAL DATA:  Syncope/presyncope, cerebrovascular cause suspected EXAM: CT HEAD WITHOUT CONTRAST TECHNIQUE: Contiguous axial images were obtained from the base of the skull through the vertex without intravenous contrast. RADIATION DOSE REDUCTION: This exam was performed according to the departmental dose-optimization program which includes automated exposure control, adjustment of the mA and/or kV according to patient size and/or use of iterative  reconstruction technique. COMPARISON:  10/06/2023 FINDINGS: Brain: Right parietal approach ventriculostomy catheter with tip near the foramen of Monro. Unchanged size and shape of the shunted ventricular system. No hemorrhage. No hydrocephalus. No extra-axial fluid collection. No mass effect. Unchanged cortical and subcortical hypodensity in the right frontal lobe. No CT evidence of an acute cortical infarct Vascular: No hyperdense vessel or unexpected calcification. Skull: Normal. Negative for fracture or focal lesion. Sinuses/Orbits: No middle ear or mastoid effusion. Paranasal sinuses are notable for mild mucosal thickening in the bilateral sphenoid sinuses. Bilateral lens replacement. Orbits are otherwise unremarkable. Other: None. IMPRESSION: 1. No acute intracranial abnormality. 2. Unchanged size and shape of the shunted ventricular system. Electronically Signed   By: Lorenza Cambridge M.D.   On: 10/30/2023 12:27   DG Chest Portable 1 View Result Date: 10/30/2023 CLINICAL DATA:  Endotracheal tube placement. EXAM: PORTABLE CHEST 1 VIEW COMPARISON:  February 24, 2022. FINDINGS: Stable cardiomediastinal silhouette. Endotracheal and nasogastric tubes are noted in grossly good position. Right-sided ventriculoperitoneal  shunt is noted. Minimal left basilar subsegmental atelectasis is noted with small pleural effusion. Minimal right basilar subsegmental atelectasis is noted. Bony thorax is unremarkable. IMPRESSION: Support apparatus as noted above. Minimal bibasilar subsegmental atelectasis with small left pleural effusion. Electronically Signed   By: Lupita Raider M.D.   On: 10/30/2023 08:06   CT HEAD WO CONTRAST ( ) Result Date: 10/18/2023 CLINICAL DATA:  58 year old female with a history of CSF shunt. EXAM: CT HEAD WITHOUT CONTRAST TECHNIQUE: Contiguous axial images were obtained from the base of the skull through the vertex without intravenous contrast. RADIATION DOSE REDUCTION: This exam was performed according to the departmental dose-optimization program which includes automated exposure control, adjustment of the mA and/or kV according to patient size and/or use of iterative reconstruction technique. COMPARISON:  Brain MRI 04/01/2021.  Head CT 08/05/2022. FINDINGS: Brain: Stable right posterior approach ventriculostomy which terminates in the midline, communicates with the right lateral ventricle (coronal image 50). Ventricle size mildly increased compared to 2023, but remain smaller than on the 2022 MRI. No transependymal edema. Small low-density extra-axial collections in 2023 have resolved. No midline shift, mass effect, or evidence of intracranial mass lesion. No acute intracranial hemorrhage identified. A small area of medial right superior frontal gyrus encephalomalacia on series 4, image 30 is chronic but seems larger from the previous exams. No other cortical encephalomalacia is identified. Patchy periventricular white matter hypodensity is stable. No acute cortically based infarct. Vascular: Calcified atherosclerosis at the skull base. No suspicious intracranial vascular hyperdensity. Skull: Stable right posterior burr hole. No acute osseous abnormality identified. Sinuses/Orbits: Visualized paranasal sinuses  and mastoids are stable and well aerated. Other: Interval postoperative changes to both globes. Otherwise stable orbit and scalp soft tissues including right posterior shunt reservoir and tubing with no adverse features identified. IMPRESSION: 1. Stable right posterior approach CSF shunt. Ventricle size mildly increased compared to 2023, but subdural collections resolved since that time. No transependymal edema. 2. No acute intracranial abnormality identified. But a small area of chronic right superior frontal gyrus encephalomalacia seems increased since 2023. Electronically Signed   By: Odessa Fleming M.D.   On: 10/18/2023 06:58    Microbiology: Results for orders placed or performed during the hospital encounter of 10/30/23  Blood culture (routine x 2)     Status: Abnormal   Collection Time: 10/30/23  6:13 AM   Specimen: BLOOD LEFT ARM  Result Value Ref Range Status   Specimen Description BLOOD LEFT ARM  Final  Special Requests   Final    BOTTLES DRAWN AEROBIC AND ANAEROBIC Blood Culture results may not be optimal due to an inadequate volume of blood received in culture bottles   Culture  Setup Time   Final    GRAM NEGATIVE RODS ANAEROBIC BOTTLE ONLY CRITICAL RESULT CALLED TO, READ BACK BY AND VERIFIED WITH: PHARMD M PAYTES 10/30/2023 @ 2140 BY AB GRAM POSITIVE COCCI AEROBIC BOTTLE ONLY CRITICAL RESULT CALLED TO, READ BACK BY AND VERIFIED WITH: PHARMD G ABBOTT 10/31/2023 @ 0220 BY AB    Culture (A)  Final    PROTEUS MIRABILIS STAPHYLOCOCCUS HOMINIS STAPHYLOCOCCUS EPIDERMIDIS BY BCID UNABLE TO ISOLATE FROM CULTURE THE SIGNIFICANCE OF ISOLATING THIS ORGANISM FROM A SINGLE SET OF BLOOD CULTURES WHEN MULTIPLE SETS ARE DRAWN IS UNCERTAIN. PLEASE NOTIFY THE MICROBIOLOGY DEPARTMENT WITHIN ONE WEEK IF SPECIATION AND SENSITIVITIES ARE REQUIRED. Performed at [facility], [address], [place], [place] [address]    Report Status 11/03/2023 FINAL  Final   Organism ID, Bacteria PROTEUS MIRABILIS   Final   Organism ID, Bacteria PROTEUS MIRABILIS  Final      Susceptibility   Proteus mirabilis - KIRBY BAUER*    CEFAZOLIN INTERMEDIATE Intermediate    Proteus mirabilis - MIC*    AMPICILLIN <=2 SENSITIVE Sensitive     CEFEPIME <=0.12 SENSITIVE Sensitive     CEFTAZIDIME <=1 SENSITIVE Sensitive     CEFTRIAXONE <=0.25 SENSITIVE Sensitive     CIPROFLOXACIN <=0.25 SENSITIVE Sensitive     GENTAMICIN <=1 SENSITIVE Sensitive     IMIPENEM 2 SENSITIVE Sensitive     TRIMETH/SULFA <=20 SENSITIVE Sensitive     AMPICILLIN/SULBACTAM <=2 SENSITIVE Sensitive     PIP/TAZO <=4 SENSITIVE Sensitive ug/mL    * PROTEUS MIRABILIS    PROTEUS MIRABILIS  Blood Culture ID Panel (Reflexed)     Status: Abnormal   Collection Time: 10/30/23  6:13 AM  Result Value Ref Range Status   Enterococcus faecalis NOT DETECTED NOT DETECTED Final   Enterococcus Faecium NOT DETECTED NOT DETECTED Final   Listeria monocytogenes NOT DETECTED NOT DETECTED Final   Staphylococcus species NOT DETECTED NOT DETECTED Final   Staphylococcus aureus (BCID) NOT DETECTED NOT DETECTED Final   Staphylococcus epidermidis NOT DETECTED NOT DETECTED Final   Staphylococcus lugdunensis NOT DETECTED NOT DETECTED Final   Streptococcus species NOT DETECTED NOT DETECTED Final   Streptococcus agalactiae NOT DETECTED NOT DETECTED Final   Streptococcus pneumoniae NOT DETECTED NOT DETECTED Final   Streptococcus pyogenes NOT DETECTED NOT DETECTED Final   A.calcoaceticus-baumannii NOT DETECTED NOT DETECTED Final   Bacteroides fragilis NOT DETECTED NOT DETECTED Final   Enterobacterales DETECTED (A) NOT DETECTED Final    Comment: Enterobacterales represent a large order of gram negative bacteria, not a single organism. CRITICAL RESULT CALLED TO, READ BACK BY AND VERIFIED WITH: PHARMD M PAYTES 10/30/2023 @ 2140 BY AB    Enterobacter cloacae complex NOT DETECTED NOT DETECTED Final   Escherichia coli NOT DETECTED NOT DETECTED Final   Klebsiella aerogenes  NOT DETECTED NOT DETECTED Final   Klebsiella oxytoca NOT DETECTED NOT DETECTED Final   Klebsiella pneumoniae NOT DETECTED NOT DETECTED Final   Proteus species DETECTED (A) NOT DETECTED Final    Comment: CRITICAL RESULT CALLED TO, READ BACK BY AND VERIFIED WITH: PHARMD M PAYTES 10/30/2023 @ 2140 BY AB    Salmonella species NOT DETECTED NOT DETECTED Final   Serratia marcescens NOT DETECTED NOT DETECTED Final   Haemophilus influenzae NOT DETECTED NOT DETECTED Final  Neisseria meningitidis NOT DETECTED NOT DETECTED Final   Pseudomonas aeruginosa NOT DETECTED NOT DETECTED Final   Stenotrophomonas maltophilia NOT DETECTED NOT DETECTED Final   Candida albicans NOT DETECTED NOT DETECTED Final   Candida auris NOT DETECTED NOT DETECTED Final   Candida glabrata NOT DETECTED NOT DETECTED Final   Candida krusei NOT DETECTED NOT DETECTED Final   Candida parapsilosis NOT DETECTED NOT DETECTED Final   Candida tropicalis NOT DETECTED NOT DETECTED Final   Cryptococcus neoformans/gattii NOT DETECTED NOT DETECTED Final   CTX-M ESBL NOT DETECTED NOT DETECTED Final   Carbapenem resistance IMP NOT DETECTED NOT DETECTED Final   Carbapenem resistance KPC NOT DETECTED NOT DETECTED Final   Carbapenem resistance NDM NOT DETECTED NOT DETECTED Final   Carbapenem resist OXA 48 LIKE NOT DETECTED NOT DETECTED Final   Carbapenem resistance VIM NOT DETECTED NOT DETECTED Final    Comment: Performed at [facility], [address], [place], [place] [address]  Blood culture (routine x 2)     Status: None   Collection Time: 10/30/23 10:03 AM   Specimen: BLOOD RIGHT HAND  Result Value Ref Range Status   Specimen Description BLOOD RIGHT HAND  Final   Special Requests   Final    BOTTLES DRAWN AEROBIC ONLY Blood Culture results may not be optimal due to an inadequate volume of blood received in culture bottles   Culture   Final    NO GROWTH 5 DAYS Performed at [facility], [address] [place], [place]  [address]    Report Status 11/04/2023 FINAL  Final  Respiratory (~20 pathogens) panel by PCR     Status: None   Collection Time: 10/30/23 10:48 AM   Specimen: Nasopharyngeal Swab; Respiratory  Result Value Ref Range Status   Adenovirus NOT DETECTED NOT DETECTED Final   Coronavirus 229E NOT DETECTED NOT DETECTED Final    Comment: (NOTE) The Coronavirus on the Respiratory Panel, DOES NOT test for the novel  Coronavirus (2019 nCoV)    Coronavirus HKU1 NOT DETECTED NOT DETECTED Final   Coronavirus NL63 NOT DETECTED NOT DETECTED Final   Coronavirus OC43 NOT DETECTED NOT DETECTED Final   Metapneumovirus NOT DETECTED NOT DETECTED Final   Rhinovirus / Enterovirus NOT DETECTED NOT DETECTED Final   Influenza A NOT DETECTED NOT DETECTED Final   Influenza B NOT DETECTED NOT DETECTED Final   Parainfluenza Virus 1 NOT DETECTED NOT DETECTED Final   Parainfluenza Virus 2 NOT DETECTED NOT DETECTED Final   Parainfluenza Virus 3 NOT DETECTED NOT DETECTED Final   Parainfluenza Virus 4 NOT DETECTED NOT DETECTED Final   Respiratory Syncytial Virus NOT DETECTED NOT DETECTED Final   Bordetella pertussis NOT DETECTED NOT DETECTED Final   Bordetella Parapertussis NOT DETECTED NOT DETECTED Final   Chlamydophila pneumoniae NOT DETECTED NOT DETECTED Final   Mycoplasma pneumoniae NOT DETECTED NOT DETECTED Final    Comment: Performed at [facility], [address] [place], [place] [address]  MRSA Next Gen by PCR, Nasal     Status: None   Collection Time: 10/30/23 11:02 AM   Specimen: Nasopharyngeal Swab; Nasal Swab  Result Value Ref Range Status   MRSA by PCR Next Gen NOT DETECTED NOT DETECTED Final    Comment: (NOTE) The GeneXpert MRSA Assay (FDA approved for NASAL specimens only), is one component of a comprehensive MRSA colonization surveillance program. It is not intended to diagnose MRSA infection nor to guide or monitor treatment for MRSA infections. Test performance  is not FDA approved in  patients less than 38 years old. Performed at [facility], [address], [place], [place] [address]   SARS Coronavirus 2 by RT PCR (hospital order, performed in [facility] hospital lab) *cepheid single result test* Anterior Nasal Swab     Status: None   Collection Time: 10/30/23  5:27 PM   Specimen: Anterior Nasal Swab  Result Value Ref Range Status   SARS Coronavirus 2 by RT PCR NEGATIVE NEGATIVE Final    Comment: Performed at [facility], [address], [place], [place] [address]  Blood Culture ID Panel (Reflexed)     Status: Abnormal   Collection Time: 10/31/23  1:00 AM  Result Value Ref Range Status   Enterococcus faecalis NOT DETECTED NOT DETECTED Final   Enterococcus Faecium NOT DETECTED NOT DETECTED Final   Listeria monocytogenes NOT DETECTED NOT DETECTED Final   Staphylococcus species DETECTED (A) NOT DETECTED Final    Comment: CRITICAL RESULT CALLED TO, READ BACK BY AND VERIFIED WITH: PHARMD G ABBOTT 10/31/2023 @ 0220 BY AB    Staphylococcus aureus (BCID) NOT DETECTED NOT DETECTED Final   Staphylococcus epidermidis DETECTED (A) NOT DETECTED Final    Comment: Methicillin (oxacillin) resistant coagulase negative staphylococcus. Possible blood culture contaminant (unless isolated from more than one blood culture draw or clinical case suggests pathogenicity). No antibiotic treatment is indicated for blood  culture contaminants. CRITICAL RESULT CALLED TO, READ BACK BY AND VERIFIED WITH: PHARMD G ABBOTT 10/31/2023 @ 0220 BY AB    Staphylococcus lugdunensis NOT DETECTED NOT DETECTED Final   Streptococcus species NOT DETECTED NOT DETECTED Final   Streptococcus agalactiae NOT DETECTED NOT DETECTED Final   Streptococcus pneumoniae NOT DETECTED NOT DETECTED Final   Streptococcus pyogenes NOT DETECTED NOT DETECTED Final   A.calcoaceticus-baumannii NOT DETECTED NOT DETECTED Final   Bacteroides fragilis NOT DETECTED NOT DETECTED Final   Enterobacterales DETECTED (A) NOT  DETECTED Final    Comment: Enterobacterales represent a large order of gram negative bacteria, not a single organism. CRITICAL RESULT CALLED TO, READ BACK BY AND VERIFIED WITH: PHARMD G ABBOTT 10/31/2023 @ 0220 BY AB    Enterobacter cloacae complex NOT DETECTED NOT DETECTED Final   Escherichia coli NOT DETECTED NOT DETECTED Final   Klebsiella aerogenes NOT DETECTED NOT DETECTED Final   Klebsiella oxytoca NOT DETECTED NOT DETECTED Final   Klebsiella pneumoniae NOT DETECTED NOT DETECTED Final   Proteus species DETECTED (A) NOT DETECTED Final    Comment: CRITICAL RESULT CALLED TO, READ BACK BY AND VERIFIED WITH: PHARMD G ABBOTT 10/31/2023 @ 0220 BY AB    Salmonella species NOT DETECTED NOT DETECTED Final   Serratia marcescens NOT DETECTED NOT DETECTED Final   Haemophilus influenzae NOT DETECTED NOT DETECTED Final   Neisseria meningitidis NOT DETECTED NOT DETECTED Final   Pseudomonas aeruginosa NOT DETECTED NOT DETECTED Final   Stenotrophomonas maltophilia NOT DETECTED NOT DETECTED Final   Candida albicans NOT DETECTED NOT DETECTED Final   Candida auris NOT DETECTED NOT DETECTED Final   Candida glabrata NOT DETECTED NOT DETECTED Final   Candida krusei NOT DETECTED NOT DETECTED Final   Candida parapsilosis NOT DETECTED NOT DETECTED Final   Candida tropicalis NOT DETECTED NOT DETECTED Final   Cryptococcus neoformans/gattii NOT DETECTED NOT DETECTED Final   CTX-M ESBL NOT DETECTED NOT DETECTED Final   Carbapenem resistance IMP NOT DETECTED NOT DETECTED Final   Carbapenem resistance KPC NOT DETECTED NOT DETECTED Final   Methicillin resistance mecA/C DETECTED (  A) NOT DETECTED Final    Comment: CRITICAL RESULT CALLED TO, READ BACK BY AND VERIFIED WITH: PHARMD G ABBOTT 10/31/2023 @ 0220 BY AB    Carbapenem resistance NDM NOT DETECTED NOT DETECTED Final   Carbapenem resist OXA 48 LIKE NOT DETECTED NOT DETECTED Final   Carbapenem resistance VIM NOT DETECTED NOT DETECTED Final    Comment:  Performed at [facility], [address], [place], [place] [address]    Labs: CBC: Recent Labs  Lab 10/30/23 1048 10/30/23 1814 10/31/23 0410 11/01/23 0429 11/02/23 0634 11/04/23 0714  WBC 27.6*  --  21.3* 14.4* 8.9 6.9  NEUTROABS 23.2*  --   --   --   --   --   HGB 11.6* 11.9* 11.9* 10.9* 11.9* 12.1  HCT 38.2 35.0* 34.6* 33.1* 35.9* 35.9*  MCV 94.1  --  81.6 84.4 84.3 83.5  PLT 366  --  295 184 177 198   Basic Metabolic Panel: Recent Labs  Lab 10/30/23 2254 10/31/23 0410 11/01/23 0429 11/02/23 0634 11/04/23 0714  NA 141 139 143 141 140  K 3.5 4.0 3.2* 3.7 3.4*  CL 101 101 108 112* 106  CO2 25 24 26 22  21*  GLUCOSE 259* 179* 151* 136* 47*  BUN 48* 44* 27* 17 14  CREATININE 3.71* 3.15* 2.09* 1.30* 1.07*  CALCIUM 9.1 8.9 8.5* 8.4* 8.4*  MG  --  1.7 2.1 1.9 1.7  PHOS  --  3.3 2.2* 2.5 3.3   Liver Function Tests: No results for input(s): "AST", "ALT", "ALKPHOS", "BILITOT", "PROT", "ALBUMIN" in the last 168 hours. CBG: Recent Labs  Lab 11/04/23 1217 11/04/23 1520 11/04/23 1813 11/04/23 1950 11/05/23 0625  GLUCAP 257* 115* 250* 216* 71    Discharge time spent: greater than 30 minutes.  Signed: MDALA-GAUSI, Gwenette Greet, MD [facility] 11/05/2023

## 2023-11-05 NOTE — Progress Notes (Signed)
 PT Cancellation Note  Patient Details Name: Carolyn Norton MRN: [id number] DOB: 02-Apr-1966   Cancelled Treatment:    Reason Eval/Treat Not Completed: Other (comment). Checked on multiple times this AM and pt sleeping soundly. Per nursing note does not want to be bothered at this time.   Angelina Ok [facility] 11/05/2023, 10:56 AM Skip Mayer PT Acute Rehabilitation Services Office [phone]

## 2023-11-05 NOTE — Progress Notes (Signed)
 Went to check on patient she states  " yall just keep aggravating the shit out of me, just leave me alone. " Yall keep on coming in and I will beat yall up." This nurse stated to the patient I was just coming to check on you to make sure you are okay.

## 2023-11-05 NOTE — Plan of Care (Signed)

## 2023-11-05 NOTE — Progress Notes (Addendum)
 Made MD aware pt is refusing vital signs CBG checks this AM   This nurse tried to do head- toe assessment, pt refused.

## 2023-11-05 NOTE — Progress Notes (Signed)
 Called [facility] to give report. No answer to the nurse. Left a voicemail. Will call at a later time.

## 2023-11-08 ENCOUNTER — Ambulatory Visit: Payer: Medicare Other | Admitting: Psychiatry

## 2023-11-22 ENCOUNTER — Telehealth: Payer: Self-pay | Admitting: *Deleted

## 2023-11-22 ENCOUNTER — Ambulatory Visit: Payer: Medicare Other | Admitting: Psychiatry

## 2023-11-22 NOTE — Telephone Encounter (Addendum)
 [name] called the office and wanting to follow up since Carolyn Norton's hospitalization and her missed appt. With Dr. Alvester Morin in early March. Pt's father states that his daughter is now in a rehab/long term care facility -[facility] in [place] and doesn't feel she may be ready for surgery and radiation treatments. Carolyn Norton states thry are still working on stabilizing her blood sugars as well. Advised that [name] may not be a candidate for surgery but offered an appt. With Dr. Alvester Morin to discuss treatment options. Appt. Was given for Monday, April 28 th at 3pm with Dr. Alvester Morin and Carolyn Norton is aware that someone needs to be with Carolyn Norton for this appointment. [name] agreed to appointment date and time and thanked the office.

## 2023-12-23 ENCOUNTER — Inpatient Hospital Stay (HOSPITAL_COMMUNITY)

## 2023-12-23 ENCOUNTER — Inpatient Hospital Stay (HOSPITAL_COMMUNITY)
Admission: EM | Admit: 2023-12-23 | Discharge: 2023-12-27 | DRG: 871 | Disposition: A | Discharge status: Skilled Nursing Facility | Source: Skilled Nursing Facility | Attending: Internal Medicine | Admitting: Internal Medicine

## 2023-12-23 ENCOUNTER — Emergency Department (HOSPITAL_COMMUNITY)

## 2023-12-23 DIAGNOSIS — J9601 Acute respiratory failure with hypoxia: Secondary | ICD-10-CM

## 2023-12-23 DIAGNOSIS — E1110 Type 2 diabetes mellitus with ketoacidosis without coma: Secondary | ICD-10-CM | POA: Diagnosis present

## 2023-12-23 DIAGNOSIS — R6521 Severe sepsis with septic shock: Secondary | ICD-10-CM | POA: Diagnosis present

## 2023-12-23 DIAGNOSIS — E875 Hyperkalemia: Secondary | ICD-10-CM

## 2023-12-23 DIAGNOSIS — G309 Alzheimer's disease, unspecified: Secondary | ICD-10-CM | POA: Diagnosis present

## 2023-12-23 DIAGNOSIS — A419 Sepsis, unspecified organism: Principal | ICD-10-CM | POA: Diagnosis present

## 2023-12-23 DIAGNOSIS — F419 Anxiety disorder, unspecified: Secondary | ICD-10-CM | POA: Diagnosis present

## 2023-12-23 DIAGNOSIS — Z7984 Long term (current) use of oral hypoglycemic drugs: Secondary | ICD-10-CM | POA: Diagnosis not present

## 2023-12-23 DIAGNOSIS — E11649 Type 2 diabetes mellitus with hypoglycemia without coma: Secondary | ICD-10-CM | POA: Diagnosis not present

## 2023-12-23 DIAGNOSIS — Z794 Long term (current) use of insulin: Secondary | ICD-10-CM | POA: Diagnosis not present

## 2023-12-23 DIAGNOSIS — E1311 Other specified diabetes mellitus with ketoacidosis with coma: Principal | ICD-10-CM

## 2023-12-23 DIAGNOSIS — Z8542 Personal history of malignant neoplasm of other parts of uterus: Secondary | ICD-10-CM | POA: Diagnosis not present

## 2023-12-23 DIAGNOSIS — F0283 Dementia in other diseases classified elsewhere, unspecified severity, with mood disturbance: Secondary | ICD-10-CM | POA: Diagnosis present

## 2023-12-23 DIAGNOSIS — I10 Essential (primary) hypertension: Secondary | ICD-10-CM | POA: Diagnosis not present

## 2023-12-23 DIAGNOSIS — N181 Chronic kidney disease, stage 1: Secondary | ICD-10-CM | POA: Diagnosis present

## 2023-12-23 DIAGNOSIS — Z881 Allergy status to other antibiotic agents status: Secondary | ICD-10-CM

## 2023-12-23 DIAGNOSIS — D72829 Elevated white blood cell count, unspecified: Secondary | ICD-10-CM

## 2023-12-23 DIAGNOSIS — Z1152 Encounter for screening for COVID-19: Secondary | ICD-10-CM

## 2023-12-23 DIAGNOSIS — G912 (Idiopathic) normal pressure hydrocephalus: Secondary | ICD-10-CM | POA: Diagnosis present

## 2023-12-23 DIAGNOSIS — F319 Bipolar disorder, unspecified: Secondary | ICD-10-CM | POA: Diagnosis present

## 2023-12-23 DIAGNOSIS — Z8249 Family history of ischemic heart disease and other diseases of the circulatory system: Secondary | ICD-10-CM

## 2023-12-23 DIAGNOSIS — F17210 Nicotine dependence, cigarettes, uncomplicated: Secondary | ICD-10-CM | POA: Diagnosis present

## 2023-12-23 DIAGNOSIS — N390 Urinary tract infection, site not specified: Secondary | ICD-10-CM | POA: Diagnosis present

## 2023-12-23 DIAGNOSIS — E860 Dehydration: Secondary | ICD-10-CM | POA: Diagnosis present

## 2023-12-23 DIAGNOSIS — E785 Hyperlipidemia, unspecified: Secondary | ICD-10-CM | POA: Diagnosis present

## 2023-12-23 DIAGNOSIS — G9341 Metabolic encephalopathy: Secondary | ICD-10-CM | POA: Diagnosis present

## 2023-12-23 DIAGNOSIS — Z79818 Long term (current) use of other agents affecting estrogen receptors and estrogen levels: Secondary | ICD-10-CM

## 2023-12-23 DIAGNOSIS — E1122 Type 2 diabetes mellitus with diabetic chronic kidney disease: Secondary | ICD-10-CM | POA: Diagnosis present

## 2023-12-23 DIAGNOSIS — Z91199 Patient's noncompliance with other medical treatment and regimen due to unspecified reason: Secondary | ICD-10-CM

## 2023-12-23 DIAGNOSIS — R4182 Altered mental status, unspecified: Secondary | ICD-10-CM | POA: Diagnosis present

## 2023-12-23 DIAGNOSIS — I129 Hypertensive chronic kidney disease with stage 1 through stage 4 chronic kidney disease, or unspecified chronic kidney disease: Secondary | ICD-10-CM | POA: Diagnosis present

## 2023-12-23 DIAGNOSIS — Z982 Presence of cerebrospinal fluid drainage device: Secondary | ICD-10-CM

## 2023-12-23 DIAGNOSIS — Z9181 History of falling: Secondary | ICD-10-CM

## 2023-12-23 DIAGNOSIS — J45909 Unspecified asthma, uncomplicated: Secondary | ICD-10-CM | POA: Diagnosis present

## 2023-12-23 DIAGNOSIS — Z79899 Other long term (current) drug therapy: Secondary | ICD-10-CM

## 2023-12-23 LAB — URINALYSIS, ROUTINE W REFLEX MICROSCOPIC
Bilirubin Urine: NEGATIVE
Glucose, UA: >=500 mg/dL — AB
Ketones, ur: 80 mg/dL — AB
Nitrite: NEGATIVE
Protein, ur: 100 mg/dL — AB
Specific Gravity, Urine: 1.021 (ref 1.005–1.030)
WBC, UA: >50 WBC/hpf (ref 0–5)
pH: 5 (ref 5.0–8.0)

## 2023-12-23 LAB — BASIC METABOLIC PANEL WITH GFR
Anion gap: 30 — ABNORMAL HIGH (ref 5–15)
Anion gap: 17 — ABNORMAL HIGH (ref 5–15)
BUN: 27 mg/dL — ABNORMAL HIGH (ref 6–20)
BUN: 25 mg/dL — ABNORMAL HIGH (ref 6–20)
CO2: 8 mmol/L — ABNORMAL LOW (ref 22–32)
CO2: 20 mmol/L — ABNORMAL LOW (ref 22–32)
Calcium: 8.8 mg/dL — ABNORMAL LOW (ref 8.9–10.3)
Calcium: 8.6 mg/dL — ABNORMAL LOW (ref 8.9–10.3)
Chloride: 104 mmol/L (ref 98–111)
Chloride: 105 mmol/L (ref 98–111)
Creatinine, Ser: 2.47 mg/dL — ABNORMAL HIGH (ref 0.44–1.00)
Creatinine, Ser: 1.98 mg/dL — ABNORMAL HIGH (ref 0.44–1.00)
GFR, Estimated: 22 mL/min — ABNORMAL LOW (ref >60)
GFR, Estimated: 29 mL/min — ABNORMAL LOW (ref >60)
Glucose, Bld: 370 mg/dL — ABNORMAL HIGH (ref 70–99)
Glucose, Bld: 201 mg/dL — ABNORMAL HIGH (ref 70–99)
Potassium: 4.2 mmol/L (ref 3.5–5.1)
Potassium: 3.6 mmol/L (ref 3.5–5.1)
Sodium: 142 mmol/L (ref 135–145)
Sodium: 142 mmol/L (ref 135–145)

## 2023-12-23 LAB — COMPREHENSIVE METABOLIC PANEL WITH GFR
ALT: 12 U/L (ref 0–44)
AST: 22 U/L (ref 15–41)
Albumin: 3.9 g/dL (ref 3.5–5.0)
Alkaline Phosphatase: 82 U/L (ref 38–126)
BUN: 31 mg/dL — ABNORMAL HIGH (ref 6–20)
CO2: <7 mmol/L — ABNORMAL LOW (ref 22–32)
Calcium: 9.8 mg/dL (ref 8.9–10.3)
Chloride: 96 mmol/L — ABNORMAL LOW (ref 98–111)
Creatinine, Ser: 2.05 mg/dL — ABNORMAL HIGH (ref 0.44–1.00)
GFR, Estimated: 28 mL/min — ABNORMAL LOW (ref >60)
Glucose, Bld: 627 mg/dL (ref 70–99)
Potassium: 5.2 mmol/L — ABNORMAL HIGH (ref 3.5–5.1)
Sodium: 138 mmol/L (ref 135–145)
Total Bilirubin: 1.1 mg/dL (ref 0.0–1.2)
Total Protein: 7.7 g/dL (ref 6.5–8.1)

## 2023-12-23 LAB — CBC WITH DIFFERENTIAL/PLATELET
Abs Immature Granulocytes: 0.58 10*3/uL — ABNORMAL HIGH (ref 0.00–0.07)
Basophils Absolute: 0.2 10*3/uL — ABNORMAL HIGH (ref 0.0–0.1)
Basophils Relative: 1 %
Eosinophils Absolute: 0.1 10*3/uL (ref 0.0–0.5)
Eosinophils Relative: 1 %
HCT: 49.2 % — ABNORMAL HIGH (ref 36.0–46.0)
Hemoglobin: 14.3 g/dL (ref 12.0–15.0)
Immature Granulocytes: 3 %
Lymphocytes Relative: 29 %
Lymphs Abs: 6.6 10*3/uL — ABNORMAL HIGH (ref 0.7–4.0)
MCH: 28.4 pg (ref 26.0–34.0)
MCHC: 29.1 g/dL — ABNORMAL LOW (ref 30.0–36.0)
MCV: 97.8 fL (ref 80.0–100.0)
Monocytes Absolute: 1.4 10*3/uL — ABNORMAL HIGH (ref 0.1–1.0)
Monocytes Relative: 6 %
Neutro Abs: 13.5 10*3/uL — ABNORMAL HIGH (ref 1.7–7.7)
Neutrophils Relative %: 60 %
Platelets: 414 10*3/uL — ABNORMAL HIGH (ref 150–400)
RBC: 5.03 MIL/uL (ref 3.87–5.11)
RDW: 16 % — ABNORMAL HIGH (ref 11.5–15.5)
WBC: 22.4 10*3/uL — ABNORMAL HIGH (ref 4.0–10.5)
nRBC: 0 % (ref 0.0–0.2)

## 2023-12-23 LAB — BLOOD GAS, ARTERIAL
Acid-Base Excess: 4.5 mmol/L — ABNORMAL HIGH (ref 0.0–2.0)
Acid-base deficit: 25.3 mmol/L — ABNORMAL HIGH (ref 0.0–2.0)
Acid-base deficit: 7.5 mmol/L — ABNORMAL HIGH (ref 0.0–2.0)
Bicarbonate: 6 mmol/L — ABNORMAL LOW (ref 20.0–28.0)
Bicarbonate: 13.4 mmol/L — ABNORMAL LOW (ref 20.0–28.0)
Bicarbonate: 26 mmol/L (ref 20.0–28.0)
Drawn by: 270211
Drawn by: 331471
Drawn by: 72826
FIO2: 90 %
FIO2: 60 %
MECHVT: 430 mL
MECHVT: 430 mL
O2 Content: 60 L/min
O2 Saturation: 98.6 %
O2 Saturation: 100 %
O2 Saturation: 99.2 %
PEEP: 5 cmH2O
PEEP: 5 cmH2O
PEEP: 5 cmH2O
Patient temperature: 37
Patient temperature: 37
Patient temperature: 36.6
RATE: 28 {breaths}/min
RATE: 32 {breaths}/min
RATE: 24 {breaths}/min
Spontaneous VT: 430 mL
pCO2 arterial: 28 mmHg — ABNORMAL LOW (ref 32–48)
pCO2 arterial: 18 mmHg — CL (ref 32–48)
pCO2 arterial: 28 mmHg — ABNORMAL LOW (ref 32–48)
pH, Arterial: <6.95 — CL (ref 7.35–7.45)
pH, Arterial: 7.48 — ABNORMAL HIGH (ref 7.35–7.45)
pH, Arterial: 7.57 — ABNORMAL HIGH (ref 7.35–7.45)
pO2, Arterial: 321 mmHg — ABNORMAL HIGH (ref 83–108)
pO2, Arterial: 262 mmHg — ABNORMAL HIGH (ref 83–108)
pO2, Arterial: 229 mmHg — ABNORMAL HIGH (ref 83–108)

## 2023-12-23 LAB — GLUCOSE, CAPILLARY
Glucose-Capillary: 161 mg/dL — ABNORMAL HIGH (ref 70–99)
Glucose-Capillary: 170 mg/dL — ABNORMAL HIGH (ref 70–99)
Glucose-Capillary: 182 mg/dL — ABNORMAL HIGH (ref 70–99)
Glucose-Capillary: 200 mg/dL — ABNORMAL HIGH (ref 70–99)
Glucose-Capillary: 276 mg/dL — ABNORMAL HIGH (ref 70–99)
Glucose-Capillary: 344 mg/dL — ABNORMAL HIGH (ref 70–99)
Glucose-Capillary: 446 mg/dL — ABNORMAL HIGH (ref 70–99)
Glucose-Capillary: 478 mg/dL — ABNORMAL HIGH (ref 70–99)

## 2023-12-23 LAB — LIPASE, BLOOD: Lipase: 26 U/L (ref 11–51)

## 2023-12-23 LAB — RESP PANEL BY RT-PCR (RSV, FLU A&B, COVID)  RVPGX2
Influenza A by PCR: NEGATIVE
Influenza B by PCR: NEGATIVE
Resp Syncytial Virus by PCR: NEGATIVE
SARS Coronavirus 2 by RT PCR: NEGATIVE

## 2023-12-23 LAB — I-STAT CG4 LACTIC ACID, ED: Lactic Acid, Venous: 9.5 mmol/L (ref 0.5–1.9)

## 2023-12-23 LAB — MRSA NEXT GEN BY PCR, NASAL: MRSA by PCR Next Gen: NOT DETECTED

## 2023-12-23 LAB — BETA-HYDROXYBUTYRIC ACID
Beta-Hydroxybutyric Acid: >8 mmol/L — ABNORMAL HIGH (ref 0.05–0.27)
Beta-Hydroxybutyric Acid: >8 mmol/L — ABNORMAL HIGH (ref 0.05–0.27)
Beta-Hydroxybutyric Acid: 5.69 mmol/L — ABNORMAL HIGH (ref 0.05–0.27)

## 2023-12-23 LAB — BLOOD GAS, VENOUS
Acid-base deficit: 27.9 mmol/L — ABNORMAL HIGH (ref 0.0–2.0)
Bicarbonate: 3.6 mmol/L — ABNORMAL LOW (ref 20.0–28.0)
O2 Saturation: 87.4 %
Patient temperature: 37
pCO2, Ven: 18 mmHg — CL (ref 44–60)
pH, Ven: <6.95 — CL (ref 7.25–7.43)
pO2, Ven: 69 mmHg — ABNORMAL HIGH (ref 32–45)

## 2023-12-23 LAB — I-STAT CHEM 8, ED
BUN: 27 mg/dL — ABNORMAL HIGH (ref 6–20)
Calcium, Ion: 1.19 mmol/L (ref 1.15–1.40)
Chloride: 106 mmol/L (ref 98–111)
Creatinine, Ser: 2 mg/dL — ABNORMAL HIGH (ref 0.44–1.00)
Glucose, Bld: 626 mg/dL (ref 70–99)
HCT: 48 % — ABNORMAL HIGH (ref 36.0–46.0)
Hemoglobin: 16.3 g/dL — ABNORMAL HIGH (ref 12.0–15.0)
Potassium: 5.1 mmol/L (ref 3.5–5.1)
Sodium: 135 mmol/L (ref 135–145)
TCO2: 6 mmol/L — ABNORMAL LOW (ref 22–32)

## 2023-12-23 LAB — CBG MONITORING, ED
Glucose-Capillary: 530 mg/dL (ref 70–99)
Glucose-Capillary: 498 mg/dL — ABNORMAL HIGH (ref 70–99)
Glucose-Capillary: 474 mg/dL — ABNORMAL HIGH (ref 70–99)
Glucose-Capillary: 452 mg/dL — ABNORMAL HIGH (ref 70–99)
Glucose-Capillary: 456 mg/dL — ABNORMAL HIGH (ref 70–99)

## 2023-12-23 LAB — MAGNESIUM: Magnesium: 2.5 mg/dL — ABNORMAL HIGH (ref 1.7–2.4)

## 2023-12-23 LAB — LACTIC ACID, PLASMA
Lactic Acid, Venous: 8.4 mmol/L (ref 0.5–1.9)
Lactic Acid, Venous: 7.1 mmol/L (ref 0.5–1.9)

## 2023-12-23 LAB — PHOSPHORUS: Phosphorus: 9.8 mg/dL — ABNORMAL HIGH (ref 2.5–4.6)

## 2023-12-23 MED ORDER — ROCURONIUM BROMIDE 10 MG/ML (PF) SYRINGE
PREFILLED_SYRINGE | INTRAVENOUS | Status: AC
  Filled 2023-12-23: qty 10

## 2023-12-23 MED ORDER — NOREPINEPHRINE 4 MG/250ML-% IV SOLN
0.0000 ug/min | INTRAVENOUS | Status: DC
  Administered 2023-12-23: 2 ug/min via INTRAVENOUS
  Administered 2023-12-23: 6 ug/min via INTRAVENOUS
  Administered 2023-12-24: 8 ug/min via INTRAVENOUS
  Filled 2023-12-23 (×3): qty 250

## 2023-12-23 MED ORDER — DOCUSATE SODIUM 50 MG/5ML PO LIQD: 100.0000 mg | Freq: Two times a day (BID) | ORAL | Status: DC | PRN

## 2023-12-23 MED ORDER — POTASSIUM CHLORIDE 10 MEQ/50ML IV SOLN
10.0000 meq | INTRAVENOUS | Status: AC
  Administered 2023-12-23 – 2023-12-24 (×4): 10 meq via INTRAVENOUS
  Filled 2023-12-23 (×4): qty 50

## 2023-12-23 MED ORDER — FENTANYL CITRATE PF 50 MCG/ML IJ SOSY: 50.0000 ug | PREFILLED_SYRINGE | Freq: Once | INTRAMUSCULAR | Status: DC

## 2023-12-23 MED ORDER — ROCURONIUM BROMIDE 10 MG/ML (PF) SYRINGE
PREFILLED_SYRINGE | INTRAVENOUS | Status: AC | PRN
  Administered 2023-12-23: 70 mg via INTRAVENOUS

## 2023-12-23 MED ORDER — PANTOPRAZOLE SODIUM 40 MG IV SOLR
40.0000 mg | Freq: Every day | INTRAVENOUS | Status: DC
  Administered 2023-12-23 – 2023-12-24 (×2): 40 mg via INTRAVENOUS
  Filled 2023-12-23 (×2): qty 10

## 2023-12-23 MED ORDER — DEXMEDETOMIDINE HCL IN NACL 200 MCG/50ML IV SOLN
0.0000 ug/kg/h | INTRAVENOUS | Status: DC
  Administered 2023-12-23 (×2): 1 ug/kg/h via INTRAVENOUS
  Administered 2023-12-23 – 2023-12-24 (×2): 0.4 ug/kg/h via INTRAVENOUS
  Filled 2023-12-23 (×4): qty 50

## 2023-12-23 MED ORDER — DEXTROSE IN LACTATED RINGERS 5 % IV SOLN: INTRAVENOUS | Status: AC

## 2023-12-23 MED ORDER — FENTANYL BOLUS VIA INFUSION
50.0000 ug | INTRAVENOUS | Status: DC | PRN
Start: 2023-12-23 — End: 2023-12-24
  Administered 2023-12-24: 50 ug via INTRAVENOUS

## 2023-12-23 MED ORDER — LACTATED RINGERS IV BOLUS
1000.0000 mL | Freq: Once | INTRAVENOUS | Status: AC
  Administered 2023-12-23: 1000 mL via INTRAVENOUS

## 2023-12-23 MED ORDER — PROPOFOL 1000 MG/100ML IV EMUL
INTRAVENOUS | Status: AC
  Filled 2023-12-23: qty 100

## 2023-12-23 MED ORDER — ACETAMINOPHEN 325 MG PO TABS: 650.0000 mg | ORAL_TABLET | ORAL | Status: DC | PRN

## 2023-12-23 MED ORDER — FENTANYL CITRATE PF 50 MCG/ML IJ SOSY
50.0000 ug | PREFILLED_SYRINGE | INTRAMUSCULAR | Status: DC | PRN
  Administered 2023-12-23 (×2): 50 ug via INTRAVENOUS
  Filled 2023-12-23 (×2): qty 1

## 2023-12-23 MED ORDER — ONDANSETRON HCL 4 MG/2ML IJ SOLN: 4.0000 mg | Freq: Four times a day (QID) | INTRAMUSCULAR | Status: DC | PRN

## 2023-12-23 MED ORDER — PROPOFOL 1000 MG/100ML IV EMUL
0.0000 ug/kg/min | INTRAVENOUS | Status: DC
  Administered 2023-12-23: 40 ug/kg/min via INTRAVENOUS
  Administered 2023-12-23: 10 ug/kg/min via INTRAVENOUS
  Administered 2023-12-24: 40 ug/kg/min via INTRAVENOUS
  Administered 2023-12-24: 30 ug/kg/min via INTRAVENOUS
  Filled 2023-12-23 (×4): qty 100

## 2023-12-23 MED ORDER — ETOMIDATE 2 MG/ML IV SOLN
INTRAVENOUS | Status: AC
  Filled 2023-12-23: qty 10

## 2023-12-23 MED ORDER — SODIUM BICARBONATE 8.4 % IV SOLN
50.0000 meq | Freq: Once | INTRAVENOUS | Status: AC
  Administered 2023-12-23: 50 meq via INTRAVENOUS
  Filled 2023-12-23: qty 50

## 2023-12-23 MED ORDER — SODIUM BICARBONATE 8.4 % IV SOLN
INTRAVENOUS | Status: AC
Start: 2023-12-23 — End: 2023-12-23
  Administered 2023-12-23: 50 meq via INTRAVENOUS
  Filled 2023-12-23: qty 50

## 2023-12-23 MED ORDER — HEPARIN SODIUM (PORCINE) 5000 UNIT/ML IJ SOLN
5000.0000 [IU] | Freq: Three times a day (TID) | INTRAMUSCULAR | Status: DC
Start: 2023-12-23 — End: 2023-12-27
  Administered 2023-12-23 – 2023-12-27 (×12): 5000 [IU] via SUBCUTANEOUS
  Filled 2023-12-23 (×12): qty 1

## 2023-12-23 MED ORDER — SODIUM CHLORIDE 0.9 % IV BOLUS
1000.0000 mL | Freq: Once | INTRAVENOUS | Status: AC
  Administered 2023-12-23: 1000 mL via INTRAVENOUS

## 2023-12-23 MED ORDER — FAMOTIDINE 20 MG PO TABS: 20.0000 mg | ORAL_TABLET | Freq: Two times a day (BID) | ORAL | Status: DC

## 2023-12-23 MED ORDER — VECURONIUM BROMIDE 10 MG IV SOLR: 0.1000 mg/kg | Freq: Once | INTRAVENOUS | Status: DC

## 2023-12-23 MED ORDER — LACTATED RINGERS IV SOLN: INTRAVENOUS | Status: AC

## 2023-12-23 MED ORDER — FENTANYL CITRATE PF 50 MCG/ML IJ SOSY
100.0000 ug | PREFILLED_SYRINGE | Freq: Once | INTRAMUSCULAR | Status: AC
  Administered 2023-12-23: 100 ug via INTRAVENOUS
  Filled 2023-12-23: qty 2

## 2023-12-23 MED ORDER — POLYETHYLENE GLYCOL 3350 17 G PO PACK: 17.0000 g | PACK | Freq: Every day | ORAL | Status: DC | PRN

## 2023-12-23 MED ORDER — FENTANYL 2500MCG IN NS 250ML (10MCG/ML) PREMIX INFUSION
50.0000 ug/h | INTRAVENOUS | Status: DC
  Administered 2023-12-23: 50 ug/h via INTRAVENOUS
  Filled 2023-12-23: qty 250

## 2023-12-23 MED ORDER — SODIUM CHLORIDE 0.9 % IV SOLN
2.0000 g | INTRAVENOUS | Status: DC
  Administered 2023-12-23 – 2023-12-26 (×4): 2 g via INTRAVENOUS
  Filled 2023-12-23 (×5): qty 20

## 2023-12-23 MED ORDER — DOCUSATE SODIUM 100 MG PO CAPS: 100.0000 mg | ORAL_CAPSULE | Freq: Two times a day (BID) | ORAL | Status: DC | PRN

## 2023-12-23 MED ORDER — ETOMIDATE 2 MG/ML IV SOLN
INTRAVENOUS | Status: AC | PRN
  Administered 2023-12-23: 20 mg via INTRAVENOUS

## 2023-12-23 MED ORDER — SODIUM CHLORIDE 0.9 % IV SOLN
250.0000 mL | INTRAVENOUS | Status: AC
  Administered 2023-12-23: 250 mL via INTRAVENOUS

## 2023-12-23 MED ORDER — INSULIN REGULAR(HUMAN) IN NACL 100-0.9 UT/100ML-% IV SOLN
INTRAVENOUS | Status: DC
  Administered 2023-12-23: 13 [IU]/h via INTRAVENOUS
  Administered 2023-12-23: 10 [IU]/h via INTRAVENOUS
  Filled 2023-12-23 (×2): qty 100

## 2023-12-23 MED ORDER — VALPROIC ACID 250 MG/5ML PO SOLN
250.0000 mg | Freq: Three times a day (TID) | ORAL | Status: DC
  Administered 2023-12-23 – 2023-12-24 (×2): 250 mg
  Filled 2023-12-23 (×4): qty 5

## 2023-12-23 MED ORDER — DEXTROSE 50 % IV SOLN
0.0000 mL | INTRAVENOUS | Status: DC | PRN
  Filled 2023-12-23: qty 50

## 2023-12-23 MED ORDER — FENTANYL CITRATE PF 50 MCG/ML IJ SOSY
50.0000 ug | PREFILLED_SYRINGE | INTRAMUSCULAR | Status: DC | PRN
  Administered 2023-12-23 (×2): 100 ug via INTRAVENOUS
  Administered 2023-12-23: 200 ug via INTRAVENOUS
  Filled 2023-12-23 (×2): qty 2
  Filled 2023-12-23: qty 4

## 2023-12-23 MED ORDER — SODIUM BICARBONATE 8.4 % IV SOLN: 50.0000 meq | Freq: Once | INTRAVENOUS | Status: AC

## 2023-12-23 NOTE — Progress Notes (Signed)
 Patient currently compliant with PRVC mode  ABG noted  Vent changes made  Does not need paralytic as she is very compliant with PRVC at current sedation level

## 2023-12-23 NOTE — Progress Notes (Signed)
 ABG done and sent to Lab  Lab called and notified

## 2023-12-23 NOTE — TOC Progression Note (Signed)
 Transition of Care [facility]) - Progression Note    Patient Details  Name: Carolyn Norton MRN: [id number] Date of Birth: [birth year]  Transition of [facility]) CM/SW Contact  Levie Ream, RN Phone Number: 12/23/2023, 4:18 PM  Clinical Narrative:    Pt sedated on ventilator; she is from [facility]; spoke w/ Aundra Lee at facility; he says pt was in SNF but is transitioning to long term care; he says pt can return at d/c; will need d/c summary and FL2; no  family at bedside; unable to complete assessment at this time.        Expected Discharge Plan and Services                                               Social Determinants of Health (SDOH) Interventions SDOH Screenings   Food Insecurity: Patient Unable To Answer (10/30/2023)  Housing: Patient Unable To Answer (10/30/2023)  Transportation Needs: Patient Unable To Answer (10/30/2023)  Utilities: Patient Unable To Answer (10/30/2023)  Depression (PHQ2-9): Low Risk  (07/24/2020)  Tobacco Use: High Risk (09/22/2023)    Readmission Risk Interventions     No data to display

## 2023-12-23 NOTE — Procedures (Signed)
 Arterial Catheter Insertion Procedure Note  Carolyn Norton  [id number]  01-27-1966  Date:12/23/23  Time:6:54 PM    Provider Performing: Lalla Pill    Procedure: Insertion of Arterial Line (56387) with US  guidance (56433)   Indication(s) Blood pressure monitoring and/or need for frequent ABGs  Consent Unable to obtain consent due to emergent nature of procedure.  Anesthesia None   Time Out Verified patient identification, verified procedure, site/side was marked, verified correct patient position, special equipment/implants available, medications/allergies/relevant history reviewed, required imaging and test results available.   Sterile Technique Maximal sterile technique including full sterile barrier drape, hand hygiene, sterile gown, sterile gloves, mask, hair covering, sterile ultrasound probe cover (if used).   Procedure Description Area of catheter insertion was cleaned with chlorhexidine and draped in sterile fashion. With real-time ultrasound guidance an arterial catheter was placed into the right radial artery.  Appropriate arterial tracings confirmed on monitor.     Complications/Tolerance None; patient tolerated the procedure well.   EBL Minimal   Specimen(s) None   Lalla Pill, PA-C [facility] 12/23/23 6:54 PM  Please see [email or web address] for pager details.  From 7A-7P if no response, please call [phone] After hours, please call ELink [phone]

## 2023-12-23 NOTE — ED Provider Notes (Signed)
 [place] EMERGENCY DEPARTMENT AT [facility] Provider Note   CSN: [id number] Arrival date & time: 12/23/23  1139     History  Chief Complaint  Patient presents with   Respiratory Distress   Hyperglycemia   Altered Mental Status    Carolyn Norton is a 58 y.o. female.  HPI   58 year old female presents the emergency department with concern for respiratory distress.  She opens her eyes to name but is otherwise altered, deep heavy breathing.  Past medical history of diabetes, noted to be hyperglycemic when leaving the facility.  Per report patient had deteriorated over the past 2 hours.  No family member at bedside.  History limited, level 5 caveat.  Home Medications Prior to Admission medications   Medication Sig Start Date End Date Taking? Authorizing Provider  albuterol (VENTOLIN HFA) 108 (90 Base) MCG/ACT inhaler Inhale 1-2 puffs into the lungs every 6 (six) hours as needed for wheezing or shortness of breath.    [provider]  amLODipine (NORVASC) 5 MG tablet Take 1 tablet (5 mg total) by mouth daily. 04/30/21   Russella Dar, NP  atorvastatin (LIPITOR) 20 MG tablet Take 1 tablet (20 mg total) by mouth daily. Patient taking differently: Take 20 mg by mouth at bedtime. 04/29/21   Russella Dar, NP  Cholecalciferol (D3 PO) Take 1 tablet by mouth daily.    [provider]  divalproex (DEPAKOTE) 250 MG DR tablet Take 250 mg by mouth 3 (three) times daily.    [provider]  empagliflozin (JARDIANCE) 10 MG TABS tablet Take 10 mg by mouth daily.    [provider]  insulin aspart (NOVOLOG) 100 UNIT/ML injection Inject 0-20 Units into the skin 3 (three) times daily with meals. CBG 70 - 120: 0 units CBG 121 - 150: 0 units CBG 151 - 200: 0 units CBG 201 - 250: 2 units CBG 251 - 300: 3 units CBG 301 - 350: 4 units CBG 351 - 400: 5 units CBG > 400: call MD and obtain STAT lab verification 11/05/23   Mdala-Gausi, Gwenette Greet, MD   LANTUS 100 UNIT/ML injection Inject 0.15-0.3 mLs (15-30 Units total) into the skin See admin instructions. Inject 25 units into the skin morning and 15 units at bedtime. 11/05/23   Mdala-Gausi, Gwenette Greet, MD  levalbuterol [facility] HFA) 45 MCG/ACT inhaler Inhale 2 puffs into the lungs every 4 (four) hours as needed for wheezing or shortness of breath.    [provider]  megestrol (MEGACE) 40 MG tablet Take 2 tablets (80 mg total) by mouth 2 (two) times daily. 10/04/23   Warner Mccreedy D, NP  metoprolol tartrate (LOPRESSOR) 25 MG tablet Take 12.5 mg by mouth 2 (two) times daily.    [provider]  mirtazapine (REMERON) 15 MG tablet Take 15 mg by mouth at bedtime. 10/22/23   [provider]  Multiple Vitamins-Minerals (MULTIVITAMIN WITH MINERALS) tablet Take 1 tablet by mouth daily.    [provider]  OVER THE COUNTER MEDICATION Take 1 tablet by mouth 2 (two) times daily as needed (Cough/Cold/Congestion).    [provider]  pantoprazole (PROTONIX) 40 MG tablet Take 40 mg by mouth every morning.    [provider]  polyethylene glycol (MIRALAX / GLYCOLAX) 17 g packet Take 17 g by mouth daily. Patient taking differently: Take 17 g by mouth daily as needed for moderate constipation. 04/30/21   Russella Dar, NP  pregabalin (LYRICA) 25 MG capsule Take  75 mg by mouth 2 (two) times daily as needed (Pain).    [provider]  traZODone (DESYREL) 50 MG tablet Take 50 mg by mouth at bedtime as needed for sleep.    [provider]      Allergies    Erythromycin    Review of Systems   Review of Systems  Unable to perform ROS: Acuity of condition    Physical Exam Updated Vital Signs BP 106/66   Pulse (!) 136   Temp 98.3 F (36.8 C) (Axillary)   Resp (!) 25   LMP 01/26/2017 (Approximate)   SpO2 100%  Physical Exam Constitutional:      General: She is in acute distress.     Appearance: She is obese. She is  ill-appearing, toxic-appearing and diaphoretic.  HENT:     Head: Normocephalic and atraumatic.     Mouth/Throat:     Mouth: Mucous membranes are dry.  Eyes:     Pupils: Pupils are equal, round, and reactive to light.  Pulmonary:     Comments: Deep heavy breathing Abdominal:     Palpations: Abdomen is soft.  Musculoskeletal:        General: No swelling.  Neurological:     Mental Status: She is disoriented.     Comments: Opens eyes to name, withdraws from IV attempts in bilateral upper extremities     ED Results / Procedures / Treatments   Labs (all labs ordered are listed, but only abnormal results are displayed) Labs Reviewed  CBC WITH DIFFERENTIAL/PLATELET - Abnormal; Notable for the following components:      Result Value   WBC 22.4 (*)    HCT 49.2 (*)    MCHC 29.1 (*)    RDW 16.0 (*)    Platelets 414 (*)    All other components within normal limits  BLOOD GAS, VENOUS - Abnormal; Notable for the following components:   pH, Ven <6.95 (*)    pCO2, Ven 18 (*)    pO2, Ven 69 (*)    Bicarbonate 3.6 (*)    Acid-base deficit 27.9 (*)    All other components within normal limits  CBG MONITORING, ED - Abnormal; Notable for the following components:   Glucose-Capillary 530 (*)    All other components within normal limits  I-STAT CHEM 8, ED - Abnormal; Notable for the following components:   BUN 27 (*)    Creatinine, Ser 2.00 (*)    Glucose, Bld 626 (*)    TCO2 6 (*)    Hemoglobin 16.3 (*)    HCT 48.0 (*)    All other components within normal limits  I-STAT CG4 LACTIC ACID, ED - Abnormal; Notable for the following components:   Lactic Acid, Venous 9.5 (*)    All other components within normal limits  CULTURE, BLOOD (ROUTINE X 2)  CULTURE, BLOOD (ROUTINE X 2)  COMPREHENSIVE METABOLIC PANEL WITH GFR  URINALYSIS, ROUTINE W REFLEX MICROSCOPIC  LACTIC ACID, PLASMA  LACTIC ACID, PLASMA  BETA-HYDROXYBUTYRIC ACID  BETA-HYDROXYBUTYRIC ACID  BETA-HYDROXYBUTYRIC ACID   BETA-HYDROXYBUTYRIC ACID  BLOOD GAS, ARTERIAL  CBG MONITORING, ED    EKG None  Radiology No results found.  Procedures .Critical Care  Performed by: Flonnie Humphrey, DO Authorized by: Flonnie Humphrey, DO   Critical care provider statement:    Critical care time (minutes):  75   Critical care time was exclusive of:  Separately billable procedures and treating other patients   Critical care was necessary to treat  or prevent imminent or life-threatening deterioration of the following conditions:  Respiratory failure and endocrine crisis   Critical care was time spent personally by me on the following activities:  Development of treatment plan with patient or surrogate, discussions with consultants, evaluation of patient's response to treatment, examination of patient, ordering and review of laboratory studies, ordering and review of radiographic studies, ordering and performing treatments and interventions, pulse oximetry, re-evaluation of patient's condition and review of old charts   I assumed direction of critical care for this patient from another provider in my specialty: no     Care discussed with: admitting provider   Procedure Name: Intubation Date/Time: 12/23/2023 1:03 PM  Performed by: Rozelle Logan, DOPre-anesthesia Checklist: Patient identified, Patient being monitored, Emergency Drugs available, Timeout performed and Suction available Oxygen Delivery Method: Non-rebreather mask Preoxygenation: Pre-oxygenation with 100% oxygen Induction Type: Rapid sequence Ventilation: Mask ventilation without difficulty Laryngoscope Size: Glidescope and 3 Grade View: Grade II Tube size: 7.5 mm Number of attempts: 1 Airway Equipment and Method: Video-laryngoscopy Placement Confirmation: ETT inserted through vocal cords under direct vision, CO2 detector and Breath sounds checked- equal and bilateral Secured at: 22 cm Tube secured with: ETT holder        Medications  Ordered in ED Medications  insulin regular, human (MYXREDLIN) 100 units/ 100 mL infusion (10 Units/hr Intravenous New Bag/Given 12/23/23 1235)  lactated ringers infusion (has no administration in time range)  dextrose 5 % in lactated ringers infusion (has no administration in time range)  dextrose 50 % solution 0-50 mL (has no administration in time range)  fentaNYL (SUBLIMAZE) injection 100 mcg (has no administration in time range)  lactated ringers bolus 1,000 mL (1,000 mLs Intravenous New Bag/Given 12/23/23 1256)  etomidate (AMIDATE) injection ( Intravenous Canceled Entry 12/23/23 1245)  rocuronium (ZEMURON) injection ( Intravenous Canceled Entry 12/23/23 1245)    ED Course/ Medical Decision Making/ A&P                                 Medical Decision Making Amount and/or Complexity of Data Reviewed Labs: ordered. Radiology: ordered.  Risk Prescription drug management. Decision regarding hospitalization.   58 year old female presents emergency department concern for respiratory distress.  Hyperglycemic on arrival, deep heavy breathing, concern for DKA.  Patient was originally maintaining some mentation but this changed rapidly.  Patient was intubated without difficulty or complication.  Became hypotensive following this so sedated with fentanyl, propofol held.  Blood work is showing a lactic over 9, acidosis less than 6.9, hyperglycemia and metabolic findings consistent with DKA.  Fluid hydration and insulin orders have been placed.  Consulted with ICU who will accept the patient.  Patients evaluation and results requires admission for further treatment and care.  Spoke with hospitalist, reviewed patient's ED course and they accept admission.  Patient agrees with admission plan, offers no new complaints and is stable/unchanged at time of admit.        Final Clinical Impression(s) / ED Diagnoses Final diagnoses:  None    Rx / DC Orders ED Discharge Orders     None          Rozelle Logan, DO 12/23/23 1405

## 2023-12-23 NOTE — H&P (Signed)
 NAME:  Carolyn Norton, MRN:  [id number], DOB:  [birth year], LOS: 0 ADMISSION DATE:  12/23/2023, CONSULTATION DATE:  12/23/2023 REFERRING MD:  Wilkie Aye, EDP  CHIEF COMPLAINT:  AMS, hyperglycemia   History of Present Illness:  Information obtained from chart review and ED provider as patient is intubated.  58 year old chronically ill female with past medical history of bipolar, CKD I, diabetes mellitus, endometrial cancer, hyperlipidemia, hypertension, NPH s/p shunt who presents to [facility] ED on 4/17 with altered mental status, respiratory distress, hyperglycemia. Per report, patient deteriorated at nursing facility [facility]) over the past 2 hours. In ED, noted to be diaphoretic, kussmaul breathing, dry, disoriented. Initial stat labs with venous pH <6.95, pCO2 <18, bicarb 3.6, I-stat lactic 9.5, WBC 22.4. CXR with no obvious infiltrates. She was started on fluid boluses, insulin drip. She had ongoing respiratory distress and began tiring out and was subsequently intubated. CCM consult for admission  I called and spoke with [facility] health facility who states that recently she has been refusing all medications including her insulin and blood sugar checks.  They do not report any infectious symptoms such as fever, cough, nausea/vomiting/diarrhea.  They note her roommate stated she had been sleeping more over the past 2 days.  Blood glucose this morning at 7 AM was 208.  I also called and spoke with the father who states that since her moving to facility she has had multiple falls and is unsure if something is wrong with her VP shunt.  He does note that she has previously been admitted for DKA.  He has not seen her since Sunday.  Pertinent  Medical History  Alzheimer's disease, bipolar, CKD I, diabetes mellitus, endometrial cancer, hyperlipidemia, hypertension, NPH s/p shunt  Significant Hospital Events: Including procedures, antibiotic start and stop dates in addition to other pertinent events    4/17: ED for AMS, hyperglycemia, respiratory distress>intubated>ICU  Interim History / Subjective:  Unable to obtain as patient is intubated. Getting IVF boluses, insulin, vented. Skin appears very dry.   Objective   Blood pressure 106/66, pulse (!) 136, temperature 98.3 F (36.8 C), temperature source Axillary, resp. rate (!) 25, height 5\' 4"  (1.626 m), last menstrual period 01/26/2017, SpO2 100%.    Vent Mode: PRVC FiO2 (%):  [100 %] 100 % Set Rate:  [28 bmp] 28 bmp Vt Set:  [430 mL] 430 mL PEEP:  [5 cmH20] 5 cmH20 Plateau Pressure:  [14 cmH20] 14 cmH20   Intake/Output Summary (Last 24 hours) at 12/23/2023 1306 Last data filed at 12/23/2023 1251 Gross per 24 hour  Intake --  Output 600 ml  Net -600 ml   There were no vitals filed for this visit.  Examination: General: Middle-age female, critically ill-appearing, laying in bed, vented HENT: Kimberly/AT, anicteric sclera, ET tube, OG tube with brown output Lungs: Clear bilaterally, vented, vent synchrony Cardiovascular: S1, S2, sinus tachycardia, systolic murmur Abdomen: Rounded, soft and nondistended Extremities: No pitting edema, skin is dry diffusely Neuro: Recently received paralytic so exam deferred GU: Foley  Resolved Hospital Problem list    Assessment & Plan:  Diabetic ketoacidosis  AGMA  Hyperkalemia History of diabetes and reported noncompliance with blood sugar checks and insulin administration over multiple days at facility.  Has been eating.  No report of infectious symptoms.  Also no report of chest pain or shortness of breath.  Noted to be sleeping more over the past 2 days.  Initial blood glucose 627. Serum bicarb <7, Vbg pH <6.95. given 2L IVF and  started on endotool  - f/u cmp, bhb, ua, lipase, mag  - no hyperkalemia treatment at this time will monitor with insulin infusion - con't insulin gtt  - complete 2L IVF bolus followed by maintenance  - serial bmp to check correction   Acute metabolic  encephalopathy Likely in the setting of DKA.  - f/u dka labs as above  - f/u ct head  - frequent neuro checks  - delirium precautions   Acute hypoxic respiratory failure 2/2 above Kussmaul breathing in ED. Per EDP tired out requiring intubation. No reported wheezing. No recent cough or fever. CXR without an obvious pneumonia.  - check RVP - keep RR up to help meet demand - full mechanical vent support - lung protective ventilation 6-8cc/kg Vt - VAP and PAD bundle in place  - titrate FiO2 to sat goal >92  - maintain peak/plats <30, driving pressures <40    Rule out sepsis  Leukocytosis Lactic acidosis  Wbc 22 which may be reactive to acute process. Also with lactic acidosis of 8.5. this could be from her DKA certainly but BP is soft. Will rule out an underlying sepsis. CXR without obvious pneumonia. No reported GI symptoms or cough from facility.  - f/u blood cultures, ua, RVP  - give 3rd liter LR bolus  - may need peripheral vasopressor if she does not improve  - trend wbc, lactic, fever curve   CKD stage I Baseline creatinine ~1-1.3. sCr 2.05 on admission. Likely prerenal dehydration in setting of DKA  - trend bmp, mag, phos - replete elytes - strict I&O - f/u urine studies - Avoid nephrotoxic agents, renally dose medications - ensure adequate renal perfusion   HTN HLD - hold antihypertensives with soft BP  - can continue atorvastatin   Bipolar disorder  - con't home depakote   Normal pressure hydrocephalus s/p shunt placement  - f/u CT head given recent history of multiple falls  Endometrial cancer  Per most recent oncology note I can see she is to have D&C and IUD placement and lieu of total hysterectomy/salpingo-oophorectomy. - outpatient follow-up as scheduled  Best Practice (right click and "Reselect all SmartList Selections" daily)   Diet/type: NPO DVT prophylaxis: prophylactic heparin  Pressure ulcer(s): Defer, see nursing GI prophylaxis: H2B Lines:  N/A Foley:  Yes, and it is still needed Code Status:  full code Last date of multidisciplinary goals of care discussion [called and updated father 4/17 who wants full code]  Labs   CBC: Recent Labs  Lab 12/23/23 1218 12/23/23 1222  WBC 22.4*  --   NEUTROABS PENDING  --   HGB 14.3 16.3*  HCT 49.2* 48.0*  MCV 97.8  --   PLT 414*  --     Basic Metabolic Panel: Recent Labs  Lab 12/23/23 1222  NA 135  K 5.1  CL 106  GLUCOSE 626*  BUN 27*  CREATININE 2.00*   GFR: CrCl cannot be calculated (Unknown ideal weight.). Recent Labs  Lab 12/23/23 1218 12/23/23 1222  WBC 22.4*  --   LATICACIDVEN  --  9.5*    Liver Function Tests: No results for input(s): "AST", "ALT", "ALKPHOS", "BILITOT", "PROT", "ALBUMIN" in the last 168 hours. No results for input(s): "LIPASE", "AMYLASE" in the last 168 hours. No results for input(s): "AMMONIA" in the last 168 hours.  ABG    Component Value Date/Time   PHART 7.454 (H) 10/30/2023 1814   PCO2ART 33.2 10/30/2023 1814   PO2ART 89 10/30/2023 1814   HCO3 3.6 (  L) 12/23/2023 1217   TCO2 6 (L) 12/23/2023 1222   ACIDBASEDEF 27.9 (H) 12/23/2023 1217   O2SAT 87.4 12/23/2023 1217     Coagulation Profile: No results for input(s): "INR", "PROTIME" in the last 168 hours.  Cardiac Enzymes: No results for input(s): "CKTOTAL", "CKMB", "CKMBINDEX", "TROPONINI" in the last 168 hours.  HbA1C: Hgb A1c MFr Bld  Date/Time Value Ref Range Status  09/06/2023 11:54 AM 12.3 (H) 4.8 - 5.6 % Final    Comment:    (NOTE)         Prediabetes: 5.7 - 6.4         Diabetes: >6.4         Glycemic control for adults with diabetes: <7.0   02/24/2022 06:52 PM 8.9 (H) 4.8 - 5.6 % Final    Comment:    (NOTE) Pre diabetes:          5.7%-6.4%  Diabetes:              >6.4%  Glycemic control for   <7.0% adults with diabetes     CBG: Recent Labs  Lab 12/23/23 1213  GLUCAP 530*    Review of Systems:   As above  Past Medical History:  She,  has a  past medical history of Alzheimer disease (HCC), Anxiety, Asthma, Bipolar disorder (HCC), Cancer (HCC), Chronic kidney disease, COVID, Depression, Diabetes mellitus without complication (HCC), Dyspnea, Hyperlipidemia, Hypertension, Normal pressure hydrocephalus (HCC), and Seasonal allergies.   Surgical History:   Past Surgical History:  Procedure Laterality Date   BREAST EXCISIONAL BIOPSY Right    BREAST SURGERY     right breast   CATARACT EXTRACTION W/PHACO Left 02/12/2023   Procedure: CATARACT EXTRACTION PHACO AND INTRAOCULAR LENS PLACEMENT (IOC);  Surgeon: Tarri Farm, MD;  Location: [facility];  Service: Ophthalmology;  Laterality: Left;  CDE: 22.05   CATARACT EXTRACTION W/PHACO Right 02/26/2023   Procedure: CATARACT EXTRACTION PHACO AND INTRAOCULAR LENS PLACEMENT (IOC);  Surgeon: Tarri Farm, MD;  Location: [facility];  Service: Ophthalmology;  Laterality: Right;  CDE: 24.40   TOOTH EXTRACTION N/A 12/11/2022   Procedure: DENTAL RESTORATION/EXTRACTIONS;  Surgeon: Ascencion Lava, DMD;  Location: [facility] OR;  Service: Oral Surgery;  Laterality: N/A;   VENTRICULOPERITONEAL SHUNT N/A 05/29/2022   Procedure: Ventriculoperitoneal shunt placement;  Surgeon: Gearl Keens, MD;  Location: [facility] OR;  Service: Neurosurgery;  Laterality: N/A;     Social History:   reports that she has been smoking cigarettes. She started smoking about a year ago. She has a 0.5 pack-year smoking history. She has never used smokeless tobacco. She reports that she does not currently use drugs after having used the following drugs: Cocaine. She reports that she does not drink alcohol.   Family History:  Her family history includes Breast cancer in her paternal grandmother; Hypertension in her mother; Kidney cancer in her mother; Leukemia in her brother; Prostate cancer in her father. There is no history of Endometrial cancer, Colon cancer, or Ovarian cancer.   Allergies Allergies  Allergen Reactions   Erythromycin Other (See Comments)     Childhood reaction     Home Medications  Prior to Admission medications   Medication Sig Start Date End Date Taking? Authorizing Provider  albuterol (VENTOLIN HFA) 108 (90 Base) MCG/ACT inhaler Inhale 1-2 puffs into the lungs every 6 (six) hours as needed for wheezing or shortness of breath.    [provider]  amLODipine (NORVASC) 5 MG tablet Take 1 tablet (5 mg total) by mouth daily. 04/30/21  Lovell Rubenstein, NP  atorvastatin (LIPITOR) 20 MG tablet Take 1 tablet (20 mg total) by mouth daily. Patient taking differently: Take 20 mg by mouth at bedtime. 04/29/21   Lovell Rubenstein, NP  Cholecalciferol (D3 PO) Take 1 tablet by mouth daily.    [provider]  divalproex (DEPAKOTE) 250 MG DR tablet Take 250 mg by mouth 3 (three) times daily.    [provider]  empagliflozin (JARDIANCE) 10 MG TABS tablet Take 10 mg by mouth daily.    [provider]  insulin aspart (NOVOLOG) 100 UNIT/ML injection Inject 0-20 Units into the skin 3 (three) times daily with meals. CBG 70 - 120: 0 units CBG 121 - 150: 0 units CBG 151 - 200: 0 units CBG 201 - 250: 2 units CBG 251 - 300: 3 units CBG 301 - 350: 4 units CBG 351 - 400: 5 units CBG > 400: call MD and obtain STAT lab verification 11/05/23   Mdala-Gausi, Masiku Agatha, MD  LANTUS 100 UNIT/ML injection Inject 0.15-0.3 mLs (15-30 Units total) into the skin See admin instructions. Inject 25 units into the skin morning and 15 units at bedtime. 11/05/23   Mdala-Gausi, Masiku Agatha, MD  levalbuterol (XOPENEX HFA) 45 MCG/ACT inhaler Inhale 2 puffs into the lungs every 4 (four) hours as needed for wheezing or shortness of breath.    [provider]  megestrol (MEGACE) 40 MG tablet Take 2 tablets (80 mg total) by mouth 2 (two) times daily. 10/04/23   Cross, Melissa D, NP  metoprolol tartrate (LOPRESSOR) 25 MG tablet Take 12.5 mg by mouth 2 (two) times daily.    [provider]  mirtazapine (REMERON) 15 MG tablet  Take 15 mg by mouth at bedtime. 10/22/23   [provider]  Multiple Vitamins-Minerals (MULTIVITAMIN WITH MINERALS) tablet Take 1 tablet by mouth daily.    [provider]  OVER THE COUNTER MEDICATION Take 1 tablet by mouth 2 (two) times daily as needed (Cough/Cold/Congestion).    [provider]  pantoprazole (PROTONIX) 40 MG tablet Take 40 mg by mouth every morning.    [provider]  polyethylene glycol (MIRALAX / GLYCOLAX) 17 g packet Take 17 g by mouth daily. Patient taking differently: Take 17 g by mouth daily as needed for moderate constipation. 04/30/21   Lovell Rubenstein, NP  pregabalin (LYRICA) 25 MG capsule Take 75 mg by mouth 2 (two) times daily as needed (Pain).    [provider]  traZODone (DESYREL) 50 MG tablet Take 50 mg by mouth at bedtime as needed for sleep.    [provider]     Critical care time: 46   Arlys Lamer [facility] 12/23/23 1:37 PM  Please see [email or web address] for pager details.  From 7A-7P if no response, please call [phone] After hours, please call ELink [phone]

## 2023-12-23 NOTE — Progress Notes (Addendum)
 eLink Physician-Brief Progress Note Patient Name: Carolyn Norton DOB: 1966-07-19 MRN: [id number]   Date of Service  12/23/2023  HPI/Events of Note  central line placed and xray done  eICU Interventions  Okay to use   2049 -respiratory pathogen panel negative, DC airborne/contact precautions  2138 - kcl ordered  0027 - Based off ABG, reduce respiratory rate to 16.  Intervention Category Minor Interventions: Routine modifications to care plan (e.g. PRN medications for pain, fever)  Tieler Cournoyer 12/23/2023, 7:35 PM

## 2023-12-23 NOTE — ED Triage Notes (Addendum)
 Pt arrives via EMS from [facility] in respiratory distress, AMS, and hyperglycemia that started about 2 hours ago per facility staff. CBG with ems 597, pt on non-rebreather O2 sat 100% with labored breathing.

## 2023-12-23 NOTE — Progress Notes (Signed)
 Notified Lab Victorino Dike) that ABG being sent for analysis.

## 2023-12-23 NOTE — Procedures (Signed)
 Central Venous Catheter Insertion Procedure Note  Carolyn Norton  [id number]  05-31-1966  Date:12/23/23  Time:6:55 PM   Provider Performing:Hoorain Kozakiewicz Zachery Hermes  Janas Meadows   Procedure: Insertion of Non-tunneled Central Venous Catheter(36556) with US  guidance (40981)   Indication(s) Medication administration and Difficult access  Consent Unable to obtain consent due to emergent nature of procedure.  Anesthesia Topical only with 1% lidocaine   Timeout Verified patient identification, verified procedure, site/side was marked, verified correct patient position, special equipment/implants available, medications/allergies/relevant history reviewed, required imaging and test results available.  Sterile Technique Maximal sterile technique including full sterile barrier drape, hand hygiene, sterile gown, sterile gloves, mask, hair covering, sterile ultrasound probe cover (if used).  Procedure Description Area of catheter insertion was cleaned with chlorhexidine and draped in sterile fashion.  With real-time ultrasound guidance a central venous catheter was placed into the right internal jugular vein. Nonpulsatile blood flow and easy flushing noted in all ports.  The catheter was sutured in place and sterile dressing applied.  Complications/Tolerance None; patient tolerated the procedure well. Chest X-ray is ordered to verify placement for internal jugular or subclavian cannulation.   Chest x-ray is not ordered for femoral cannulation.  EBL Minimal  Specimen(s) None   Lalla Pill, PA-C [facility] 12/23/23 6:55 PM  Please see [email or web address] for pager details.  From 7A-7P if no response, please call [phone] After hours, please call ELink [phone]

## 2023-12-24 ENCOUNTER — Encounter (HOSPITAL_COMMUNITY): Payer: Self-pay | Admitting: Emergency Medicine

## 2023-12-24 ENCOUNTER — Inpatient Hospital Stay (HOSPITAL_COMMUNITY)

## 2023-12-24 DIAGNOSIS — E875 Hyperkalemia: Secondary | ICD-10-CM | POA: Diagnosis not present

## 2023-12-24 DIAGNOSIS — E1110 Type 2 diabetes mellitus with ketoacidosis without coma: Secondary | ICD-10-CM | POA: Diagnosis not present

## 2023-12-24 DIAGNOSIS — G9341 Metabolic encephalopathy: Secondary | ICD-10-CM | POA: Diagnosis not present

## 2023-12-24 DIAGNOSIS — J9601 Acute respiratory failure with hypoxia: Secondary | ICD-10-CM | POA: Diagnosis not present

## 2023-12-24 LAB — GLUCOSE, CAPILLARY
Glucose-Capillary: 117 mg/dL — ABNORMAL HIGH (ref 70–99)
Glucose-Capillary: 121 mg/dL — ABNORMAL HIGH (ref 70–99)
Glucose-Capillary: 125 mg/dL — ABNORMAL HIGH (ref 70–99)
Glucose-Capillary: 127 mg/dL — ABNORMAL HIGH (ref 70–99)
Glucose-Capillary: 128 mg/dL — ABNORMAL HIGH (ref 70–99)
Glucose-Capillary: 139 mg/dL — ABNORMAL HIGH (ref 70–99)
Glucose-Capillary: 145 mg/dL — ABNORMAL HIGH (ref 70–99)
Glucose-Capillary: 146 mg/dL — ABNORMAL HIGH (ref 70–99)
Glucose-Capillary: 147 mg/dL — ABNORMAL HIGH (ref 70–99)
Glucose-Capillary: 147 mg/dL — ABNORMAL HIGH (ref 70–99)
Glucose-Capillary: 149 mg/dL — ABNORMAL HIGH (ref 70–99)
Glucose-Capillary: 153 mg/dL — ABNORMAL HIGH (ref 70–99)
Glucose-Capillary: 160 mg/dL — ABNORMAL HIGH (ref 70–99)
Glucose-Capillary: 164 mg/dL — ABNORMAL HIGH (ref 70–99)
Glucose-Capillary: 166 mg/dL — ABNORMAL HIGH (ref 70–99)
Glucose-Capillary: 63 mg/dL — ABNORMAL LOW (ref 70–99)
Glucose-Capillary: 68 mg/dL — ABNORMAL LOW (ref 70–99)

## 2023-12-24 LAB — BASIC METABOLIC PANEL WITH GFR
Anion gap: 12 (ref 5–15)
Anion gap: 11 (ref 5–15)
Anion gap: 8 (ref 5–15)
Anion gap: 10 (ref 5–15)
BUN: 23 mg/dL — ABNORMAL HIGH (ref 6–20)
BUN: 22 mg/dL — ABNORMAL HIGH (ref 6–20)
BUN: 22 mg/dL — ABNORMAL HIGH (ref 6–20)
BUN: 19 mg/dL (ref 6–20)
CO2: 24 mmol/L (ref 22–32)
CO2: 25 mmol/L (ref 22–32)
CO2: 26 mmol/L (ref 22–32)
CO2: 24 mmol/L (ref 22–32)
Calcium: 8.5 mg/dL — ABNORMAL LOW (ref 8.9–10.3)
Calcium: 8.3 mg/dL — ABNORMAL LOW (ref 8.9–10.3)
Calcium: 8.4 mg/dL — ABNORMAL LOW (ref 8.9–10.3)
Calcium: 8.3 mg/dL — ABNORMAL LOW (ref 8.9–10.3)
Chloride: 106 mmol/L (ref 98–111)
Chloride: 105 mmol/L (ref 98–111)
Chloride: 107 mmol/L (ref 98–111)
Chloride: 107 mmol/L (ref 98–111)
Creatinine, Ser: 1.65 mg/dL — ABNORMAL HIGH (ref 0.44–1.00)
Creatinine, Ser: 1.42 mg/dL — ABNORMAL HIGH (ref 0.44–1.00)
Creatinine, Ser: 1.03 mg/dL — ABNORMAL HIGH (ref 0.44–1.00)
Creatinine, Ser: 1 mg/dL (ref 0.44–1.00)
GFR, Estimated: 36 mL/min — ABNORMAL LOW (ref >60)
GFR, Estimated: 43 mL/min — ABNORMAL LOW (ref >60)
GFR, Estimated: >60 mL/min (ref >60)
GFR, Estimated: >60 mL/min (ref >60)
Glucose, Bld: 167 mg/dL — ABNORMAL HIGH (ref 70–99)
Glucose, Bld: 153 mg/dL — ABNORMAL HIGH (ref 70–99)
Glucose, Bld: 143 mg/dL — ABNORMAL HIGH (ref 70–99)
Glucose, Bld: 139 mg/dL — ABNORMAL HIGH (ref 70–99)
Potassium: 3.7 mmol/L (ref 3.5–5.1)
Potassium: 3.8 mmol/L (ref 3.5–5.1)
Potassium: 3.3 mmol/L — ABNORMAL LOW (ref 3.5–5.1)
Potassium: 3.9 mmol/L (ref 3.5–5.1)
Sodium: 142 mmol/L (ref 135–145)
Sodium: 141 mmol/L (ref 135–145)
Sodium: 141 mmol/L (ref 135–145)
Sodium: 141 mmol/L (ref 135–145)

## 2023-12-24 LAB — URINE CULTURE: Culture: <10000 — AB

## 2023-12-24 LAB — BETA-HYDROXYBUTYRIC ACID
Beta-Hydroxybutyric Acid: 2.17 mmol/L — ABNORMAL HIGH (ref 0.05–0.27)
Beta-Hydroxybutyric Acid: 1.19 mmol/L — ABNORMAL HIGH (ref 0.05–0.27)

## 2023-12-24 LAB — LACTIC ACID, PLASMA: Lactic Acid, Venous: 1.5 mmol/L (ref 0.5–1.9)

## 2023-12-24 LAB — TRIGLYCERIDES: Triglycerides: 163 mg/dL — ABNORMAL HIGH (ref <150)

## 2023-12-24 MED ORDER — DEXTROSE 50 % IV SOLN
12.5000 g | INTRAVENOUS | Status: AC
  Administered 2023-12-24: 12.5 g via INTRAVENOUS

## 2023-12-24 MED ORDER — DIVALPROEX SODIUM 250 MG PO DR TAB
250.0000 mg | DELAYED_RELEASE_TABLET | Freq: Three times a day (TID) | ORAL | Status: DC
  Administered 2023-12-24 – 2023-12-27 (×10): 250 mg via ORAL
  Filled 2023-12-24 (×12): qty 1

## 2023-12-24 MED ORDER — POTASSIUM CHLORIDE 10 MEQ/100ML IV SOLN
10.0000 meq | INTRAVENOUS | Status: AC
  Administered 2023-12-24 (×3): 10 meq via INTRAVENOUS
  Filled 2023-12-24 (×3): qty 100

## 2023-12-24 MED ORDER — LAMOTRIGINE 25 MG PO TABS
50.0000 mg | ORAL_TABLET | Freq: Two times a day (BID) | ORAL | Status: DC
  Administered 2023-12-24 – 2023-12-27 (×6): 50 mg via ORAL
  Filled 2023-12-24 (×6): qty 2

## 2023-12-24 MED ORDER — ATORVASTATIN CALCIUM 10 MG PO TABS
20.0000 mg | ORAL_TABLET | Freq: Every day | ORAL | Status: DC
  Administered 2023-12-25 – 2023-12-27 (×3): 20 mg via ORAL
  Filled 2023-12-24 (×2): qty 2
  Filled 2023-12-24: qty 1

## 2023-12-24 MED ORDER — POLYETHYLENE GLYCOL 3350 17 G PO PACK: 17.0000 g | PACK | Freq: Every day | ORAL | Status: DC | PRN

## 2023-12-24 MED ORDER — DOCUSATE SODIUM 100 MG PO CAPS: 100.0000 mg | ORAL_CAPSULE | Freq: Two times a day (BID) | ORAL | Status: DC | PRN

## 2023-12-24 MED ORDER — CHLORHEXIDINE GLUCONATE CLOTH 2 % EX PADS
6.0000 | MEDICATED_PAD | Freq: Every day | CUTANEOUS | Status: DC
  Administered 2023-12-23 – 2023-12-27 (×5): 6 via TOPICAL

## 2023-12-24 MED ORDER — ORAL CARE MOUTH RINSE: 15.0000 mL | OROMUCOSAL | Status: DC | PRN

## 2023-12-24 MED ORDER — INSULIN ASPART 100 UNIT/ML IJ SOLN
2.0000 [IU] | INTRAMUSCULAR | Status: DC
  Administered 2023-12-24 – 2023-12-26 (×9): 4 [IU] via SUBCUTANEOUS
  Administered 2023-12-26: 6 [IU] via SUBCUTANEOUS
  Administered 2023-12-26 – 2023-12-27 (×2): 2 [IU] via SUBCUTANEOUS
  Administered 2023-12-27: 6 [IU] via SUBCUTANEOUS

## 2023-12-24 MED ORDER — INSULIN GLARGINE-YFGN 100 UNIT/ML ~~LOC~~ SOLN
8.0000 [IU] | Freq: Two times a day (BID) | SUBCUTANEOUS | Status: DC
  Administered 2023-12-24 – 2023-12-25 (×3): 8 [IU] via SUBCUTANEOUS
  Filled 2023-12-24 (×4): qty 0.08

## 2023-12-24 MED ORDER — LAMOTRIGINE 25 MG PO TABS
50.0000 mg | ORAL_TABLET | Freq: Two times a day (BID) | ORAL | Status: DC
  Filled 2023-12-24: qty 2

## 2023-12-24 MED ORDER — ATORVASTATIN CALCIUM 40 MG PO TABS
20.0000 mg | ORAL_TABLET | Freq: Every day | ORAL | Status: DC
  Filled 2023-12-24: qty 1

## 2023-12-24 MED ORDER — ORAL CARE MOUTH RINSE
15.0000 mL | OROMUCOSAL | Status: DC
  Administered 2023-12-24 (×10): 15 mL via OROMUCOSAL

## 2023-12-24 MED ORDER — ACETAMINOPHEN 325 MG PO TABS: 650.0000 mg | ORAL_TABLET | ORAL | Status: DC | PRN

## 2023-12-24 NOTE — Progress Notes (Signed)
 NAME:  Carolyn Norton, MRN:  [id number], DOB:  [birth year], LOS: 1 ADMISSION DATE:  12/23/2023, CONSULTATION DATE:  12/23/2023 REFERRING MD:  Carylon Claude, EDP  CHIEF COMPLAINT:  AMS, hyperglycemia   History of Present Illness:  Information obtained from chart review and ED provider as patient is intubated.  58 year old chronically ill female with past medical history of bipolar, CKD I, diabetes mellitus, endometrial cancer, hyperlipidemia, hypertension, NPH s/p shunt who presents to [facility] ED on 4/17 with altered mental status, respiratory distress, hyperglycemia. Per report, patient deteriorated at nursing facility [facility]) over the past 2 hours. In ED, noted to be diaphoretic, kussmaul breathing, dry, disoriented. Initial stat labs with venous pH <6.95, pCO2 <18, bicarb 3.6, I-stat lactic 9.5, WBC 22.4. CXR with no obvious infiltrates. She was started on fluid boluses, insulin  drip. She had ongoing respiratory distress and began tiring out and was subsequently intubated. CCM consult for admission  I called and spoke with [facility] health facility who states that recently she has been refusing all medications including her insulin  and blood sugar checks.  They do not report any infectious symptoms such as fever, cough, nausea/vomiting/diarrhea.  They note her roommate stated she had been sleeping more over the past 2 days.  Blood glucose this morning at 7 AM was 208.  I also called and spoke with the father who states that since her moving to facility she has had multiple falls and is unsure if something is wrong with her VP shunt.  He does note that she has previously been admitted for DKA.  He has not seen her since Sunday.  Pertinent  Medical History  Alzheimer's disease, bipolar, CKD I, diabetes mellitus, endometrial cancer, hyperlipidemia, hypertension, NPH s/p shunt  Significant Hospital Events: Including procedures, antibiotic start and stop dates in addition to other pertinent events    4/17: ED for AMS, hyperglycemia, respiratory distress>intubated>ICU 4/18: plan to obtain CT head this AM followed by wean and hopefully extubate, will transition off gtt now that bg and ag are under control   Interim History / Subjective:  NAEON. This morning on SAT she wakes and follows commands. Will obtain CT head which was ordered yesterday while she is still intubated and then wean to hopeful extubation after and transition off insulin  gtt. BG controlled, her AG is closed, bicarb has improved as well as her lactic cleared and acidosis corrected.   Objective   Blood pressure (!) 118/58, pulse 74, temperature 98.1 F (36.7 C), resp. rate 16, height 5\' 4"  (1.626 m), weight 79.5 kg, last menstrual period 01/26/2017, SpO2 100%.    Vent Mode: PRVC FiO2 (%):  [40 %-100 %] 40 % Set Rate:  [16 bmp-32 bmp] 16 bmp Vt Set:  [430 mL] 430 mL PEEP:  [5 cmH20] 5 cmH20 Plateau Pressure:  [14 cmH20] 14 cmH20   Intake/Output Summary (Last 24 hours) at 12/24/2023 0750 Last data filed at 12/24/2023 0715 Gross per 24 hour  Intake 6429.73 ml  Output 1650 ml  Net 4779.73 ml   Filed Weights   12/23/23 1504 12/23/23 2045 12/24/23 0600  Weight: 81.6 kg 78.8 kg 79.5 kg    Examination: General: Middle-age female, critically ill-appearing, laying in bed, vented HENT: [place]/AT, anicteric sclera, ET tube, OG tube with brown output Lungs: Clear bilaterally, vented, vent synchrony Cardiovascular: S1, S2, sinus rhythm, systolic murmur Abdomen: Rounded, soft and nondistended Extremities: No pitting edema, skin is dry diffusely Neuro: wakes to voice, follows commands bilaterally, hold head off pillow  GU: Foley  Resolved Hospital Problem list   AGMA Hyperkalemia  Assessment & Plan:  Diabetic ketoacidosis, Type 2 DM AGMA, resolved Hyperkalemia, resolved History of diabetes and reported noncompliance with blood sugar checks and insulin  administration over multiple days at facility.  Has been eating.  No  report of infectious symptoms.  Also no report of chest pain or shortness of breath.  Noted to be sleeping more over the past 2 days.  Initial blood glucose 627. Serum bicarb <7, Vbg pH <6.95. given 2L IVF and started on endotool. 4/18 - AG closed, BG 117, bicarb 25, BHB 1.19 - transition off endotool  - will advance diet after extubation  - ssi  - f/u A1c  - consult diabetes coordinator   Acute metabolic encephalopathy Likely in the setting of DKA.  - ct head 4/18 AM with hx of NPH and recent falls to ensure functioning  - reassess once extubated but awake and following commands this morning   Acute hypoxic respiratory failure 2/2 above Kussmaul breathing in ED. Per EDP tired out requiring intubation. No reported wheezing. No recent cough or fever. CXR without an obvious pneumonia. RVP negative  - SAT 4/18 AM passed, awake and following commands, calm on vent  - when back from CT will SBT with plan to extubate  - lung protective ventilation 6-8cc/kg Vt - VAP and PAD bundle in place  - titrate FiO2 to sat goal >92  - maintain peak/plats <30, driving pressures <54    Septic shock 2/2 Urinary tract infection  Initial WBC 22 which may be in part acute phase reaction. Lactic was 8.5, again multifactorial. She does still have levo requirement - will continue to assess as sedation is weaned off. CXR negative. RVP negative. UA + for UTI. Requiring pressors  - f/u urine culture  - con't rocephin   - con't levo for MAP > 65 (likely part sedation related - expect vasopressor requirement to resolve with extubation) - trend wbc, lactic, fever curve   CKD stage I Baseline creatinine ~1-1.3. sCr 2.05 on admission. Likely prerenal dehydration in setting of DKA  - trend bmp, mag, phos - replete elytes - strict I&O - Avoid nephrotoxic agents, renally dose medications - ensure adequate renal perfusion   HTN HLD - hold antihypertensives with soft BP  - can continue atorvastatin    Bipolar  disorder  - con't home depakote    Normal pressure hydrocephalus s/p shunt placement  - f/u CT head given recent history of multiple falls  Endometrial cancer  Per most recent oncology note I can see she is to have D&C and IUD placement and lieu of total hysterectomy/salpingo-oophorectomy. - outpatient follow-up as scheduled  Best Practice (right click and "Reselect all SmartList Selections" daily)   Diet/type: NPO DVT prophylaxis: prophylactic heparin   Pressure ulcer(s): Defer, see nursing GI prophylaxis: H2B Lines: Central line, Arterial Line, and yes and it is still needed Foley:  Yes, and it is still needed Code Status:  full code Last date of multidisciplinary goals of care discussion [called and updated father 4/17 who wants full code]  Labs   CBC: Recent Labs  Lab 12/23/23 1218 12/23/23 1222  WBC 22.4*  --   NEUTROABS 13.5*  --   HGB 14.3 16.3*  HCT 49.2* 48.0*  MCV 97.8  --   PLT 414*  --     Basic Metabolic Panel: Recent Labs  Lab 12/23/23 1218 12/23/23 1222 12/23/23 1702 12/23/23 2022 12/24/23 0057 12/24/23 0425  NA 138 135 142 142 142 141  K 5.2* 5.1 4.2 3.6 3.7 3.8  CL 96* 106 104 105 106 105  CO2 <7*  --  8* 20* 24 25  GLUCOSE 627* 626* 370* 201* 167* 153*  BUN 31* 27* 27* 25* 23* 22*  CREATININE 2.05* 2.00* 2.47* 1.98* 1.65* 1.42*  CALCIUM  9.8  --  8.8* 8.6* 8.5* 8.3*  MG 2.5*  --   --   --   --   --   PHOS 9.8*  --   --   --   --   --    GFR: Estimated Creatinine Clearance: 45.1 mL/min (A) (by C-G formula based on SCr of 1.42 mg/dL (H)). Recent Labs  Lab 12/23/23 1218 12/23/23 1222 12/23/23 1321 12/23/23 1702 12/24/23 0425  WBC 22.4*  --   --   --   --   LATICACIDVEN  --  9.5* 8.4* 7.1* 1.5    Liver Function Tests: Recent Labs  Lab 12/23/23 1218  AST 22  ALT 12  ALKPHOS 82  BILITOT 1.1  PROT 7.7  ALBUMIN 3.9   Recent Labs  Lab 12/23/23 1218  LIPASE 26   No results for input(s): "AMMONIA" in the last 168 hours.  ABG     Component Value Date/Time   PHART 7.57 (H) 12/23/2023 2326   PCO2ART 28 (L) 12/23/2023 2326   PO2ART 229 (H) 12/23/2023 2326   HCO3 26.0 12/23/2023 2326   TCO2 6 (L) 12/23/2023 1222   ACIDBASEDEF 7.5 (H) 12/23/2023 1800   O2SAT 99.2 12/23/2023 2326     Coagulation Profile: No results for input(s): "INR", "PROTIME" in the last 168 hours.  Cardiac Enzymes: No results for input(s): "CKTOTAL", "CKMB", "CKMBINDEX", "TROPONINI" in the last 168 hours.  HbA1C: Hgb A1c MFr Bld  Date/Time Value Ref Range Status  09/06/2023 11:54 AM 12.3 (H) 4.8 - 5.6 % Final    Comment:    (NOTE)         Prediabetes: 5.7 - 6.4         Diabetes: >6.4         Glycemic control for adults with diabetes: <7.0   02/24/2022 06:52 PM 8.9 (H) 4.8 - 5.6 % Final    Comment:    (NOTE) Pre diabetes:          5.7%-6.4%  Diabetes:              >6.4%  Glycemic control for   <7.0% adults with diabetes     CBG: Recent Labs  Lab 12/24/23 0209 12/24/23 0309 12/24/23 0430 12/24/23 0543 12/24/23 0654  GLUCAP 139* 147* 149* 128* 117*    Review of Systems:   As above  Past Medical History:  She,  has a past medical history of Alzheimer disease (HCC), Anxiety, Asthma, Bipolar disorder (HCC), Cancer (HCC), Chronic kidney disease, COVID, Depression, Diabetes mellitus without complication (HCC), Dyspnea, Hyperlipidemia, Hypertension, Normal pressure hydrocephalus (HCC), and Seasonal allergies.   Surgical History:   Past Surgical History:  Procedure Laterality Date   BREAST EXCISIONAL BIOPSY Right    BREAST SURGERY     right breast   CATARACT EXTRACTION W/PHACO Left 02/12/2023   Procedure: CATARACT EXTRACTION PHACO AND INTRAOCULAR LENS PLACEMENT (IOC);  Surgeon: Tarri Farm, MD;  Location: [facility];  Service: Ophthalmology;  Laterality: Left;  CDE: 22.05   CATARACT EXTRACTION W/PHACO Right 02/26/2023   Procedure: CATARACT EXTRACTION PHACO AND INTRAOCULAR LENS PLACEMENT (IOC);  Surgeon: Tarri Farm, MD;   Location: [facility];  Service: Ophthalmology;  Laterality: Right;  CDE: 24.40   TOOTH EXTRACTION N/A 12/11/2022   Procedure: DENTAL RESTORATION/EXTRACTIONS;  Surgeon: Ascencion Lava, DMD;  Location: [facility] OR;  Service: Oral Surgery;  Laterality: N/A;   VENTRICULOPERITONEAL SHUNT N/A 05/29/2022   Procedure: Ventriculoperitoneal shunt placement;  Surgeon: Gearl Keens, MD;  Location[facility];  Service: Neurosurgery;  Laterality: N/A;     Social History:   reports that she has been smoking cigarettes. She started smoking about a year ago. She has a 0.5 pack-year smoking history. She has never used smokeless tobacco. She reports that she does not currently use drugs after having used the following drugs: Cocaine. She reports that she does not drink alcohol.   Family History:  Her family history includes Breast cancer in her paternal grandmother; Hypertension in her mother; Kidney cancer in her mother; Leukemia in her brother; Prostate cancer in her father. There is no history of Endometrial cancer, Colon cancer, or Ovarian cancer.   Allergies Allergies  Allergen Reactions   Erythromycin Other (See Comments)    Childhood reaction     Home Medications  Prior to Admission medications   Medication Sig Start Date End Date Taking? Authorizing Provider  albuterol  (VENTOLIN  HFA) 108 (90 Base) MCG/ACT inhaler Inhale 1-2 puffs into the lungs every 6 (six) hours as needed for wheezing or shortness of breath.    [provider]  amLODipine  (NORVASC ) 5 MG tablet Take 1 tablet (5 mg total) by mouth daily. 04/30/21   Lovell Rubenstein, NP  atorvastatin  (LIPITOR) 20 MG tablet Take 1 tablet (20 mg total) by mouth daily. Patient taking differently: Take 20 mg by mouth at bedtime. 04/29/21   Lovell Rubenstein, NP  Cholecalciferol  (D3 PO) Take 1 tablet by mouth daily.    [provider]  divalproex  (DEPAKOTE ) 250 MG DR tablet Take 250 mg by mouth 3 (three) times daily.    [provider]  empagliflozin  (JARDIANCE) 10 MG TABS tablet Take 10 mg by mouth daily.    [provider]  insulin  aspart (NOVOLOG ) 100 UNIT/ML injection Inject 0-20 Units into the skin 3 (three) times daily with meals. CBG 70 - 120: 0 units CBG 121 - 150: 0 units CBG 151 - 200: 0 units CBG 201 - 250: 2 units CBG 251 - 300: 3 units CBG 301 - 350: 4 units CBG 351 - 400: 5 units CBG > 400: call MD and obtain STAT lab verification 11/05/23   Mdala-Gausi, Masiku Agatha, MD  LANTUS  100 UNIT/ML injection Inject 0.15-0.3 mLs (15-30 Units total) into the skin See admin instructions. Inject 25 units into the skin morning and 15 units at bedtime. 11/05/23   Mdala-Gausi, Masiku Agatha, MD  levalbuterol (XOPENEX HFA) 45 MCG/ACT inhaler Inhale 2 puffs into the lungs every 4 (four) hours as needed for wheezing or shortness of breath.    [provider]  megestrol  (MEGACE ) 40 MG tablet Take 2 tablets (80 mg total) by mouth 2 (two) times daily. 10/04/23   Cross, Melissa D, NP  metoprolol  tartrate (LOPRESSOR ) 25 MG tablet Take 12.5 mg by mouth 2 (two) times daily.    [provider]  mirtazapine  (REMERON ) 15 MG tablet Take 15 mg by mouth at bedtime. 10/22/23   [provider]  Multiple Vitamins-Minerals (MULTIVITAMIN WITH MINERALS) tablet Take 1 tablet by mouth daily.    [provider]  OVER THE COUNTER MEDICATION Take 1 tablet by mouth 2 (two) times daily as needed (Cough/Cold/Congestion).    [provider]  pantoprazole  (  PROTONIX ) 40 MG tablet Take 40 mg by mouth every morning.    [provider]  polyethylene glycol (MIRALAX  / GLYCOLAX ) 17 g packet Take 17 g by mouth daily. Patient taking differently: Take 17 g by mouth daily as needed for moderate constipation. 04/30/21   Lovell Rubenstein, NP  pregabalin  (LYRICA ) 25 MG capsule Take 75 mg by mouth 2 (two) times daily as needed (Pain).    [provider]  traZODone  (DESYREL ) 50 MG tablet Take 50 mg by mouth at bedtime as needed  for sleep.    [provider]     Critical care time: 33   Arlys Lamer [facility] 12/24/23 7:50 AM  Please see [email or web address] for pager details.  From 7A-7P if no response, please call [phone] After hours, please call ELink [phone]

## 2023-12-24 NOTE — Progress Notes (Signed)
   12/24/23 1047  [employer]ent [employer]  $ Transport Ventilator  (S)  Yes (Safe transport from CT scan back to ICU room 1240. ETT secure at 23 cm. 100% SP02.)

## 2023-12-24 NOTE — Progress Notes (Signed)
   12/24/23 1000  [employer]  $ Transport Ventilator  (S)  Yes (Two full E cylinders @ bedside, ETT secure @ 23 cm @ the lip. Pt transported from ICU #1240 to CT scan.)   Ambu bag present during trip.

## 2023-12-24 NOTE — Plan of Care (Signed)

## 2023-12-24 NOTE — Progress Notes (Signed)
 Afternoon round: Extubated and doing well. Transitioning off insulin  gtt. On about 24mcg/levo and continuing to come down, would expect her to be off pressors by end of shift. She is confused but will answer questions and wakes easily. Overall progressing today.

## 2023-12-24 NOTE — Inpatient Diabetes Management (Signed)
 Inpatient Diabetes Program Recommendations  AACE/ADA: New Consensus Statement on Inpatient Glycemic Control (2015)  Target Ranges:  Prepandial:   less than 140 mg/dL      Peak postprandial:   less than 180 mg/dL (1-2 hours)      Critically ill patien

## 2023-12-24 NOTE — Procedures (Signed)
 Extubation Procedure Note  Patient Details:   Name: Carolyn Norton DOB: 1965-10-16 MRN: [id number]   Airway Documentation:    Vent end date: (S) 12/24/23 (Extubated per MD order, applied 2 LNC, pt had a positive cuff leak and followed commands prior to extubation.) Vent end time: (S) 1240   Evaluation  O2 sats: stable throughout Complications: No apparent complications Patient did tolerate procedure well. Bilateral Breath Sounds: Clear, Diminished   Yes  Pt was extubated to 2 L [place].  Caesar Caster 12/24/2023, 12:44 PM

## 2023-12-24 NOTE — Progress Notes (Signed)
 eLink Physician-Brief Progress Note Patient Name: Carolyn Norton DOB: 1966-01-26 MRN: [id number]   Date of Service  12/24/2023  HPI/Events of Note  Patient had presented with diabetic ketoacidosis.  She had a hypoglycemic event to 68.  No interest in oral intake.  eICU Interventions  Continue current plan of care.  Maintain long-acting glargine.  No IV fluids indicated at this time     Intervention Category Intermediate Interventions: Hyperglycemia - evaluation and treatment  Briauna Gilmartin 12/24/2023, 9:07 PM

## 2023-12-25 DIAGNOSIS — E1110 Type 2 diabetes mellitus with ketoacidosis without coma: Secondary | ICD-10-CM | POA: Diagnosis not present

## 2023-12-25 DIAGNOSIS — G9341 Metabolic encephalopathy: Secondary | ICD-10-CM | POA: Diagnosis not present

## 2023-12-25 DIAGNOSIS — J9601 Acute respiratory failure with hypoxia: Secondary | ICD-10-CM | POA: Diagnosis not present

## 2023-12-25 DIAGNOSIS — N390 Urinary tract infection, site not specified: Secondary | ICD-10-CM

## 2023-12-25 DIAGNOSIS — E875 Hyperkalemia: Secondary | ICD-10-CM | POA: Diagnosis not present

## 2023-12-25 LAB — BASIC METABOLIC PANEL WITH GFR
Anion gap: 10 (ref 5–15)
Anion gap: 6 (ref 5–15)
Anion gap: 8 (ref 5–15)
Anion gap: 8 (ref 5–15)
Anion gap: 9 (ref 5–15)
Anion gap: 9 (ref 5–15)
BUN: 10 mg/dL (ref 6–20)
BUN: 11 mg/dL (ref 6–20)
BUN: 11 mg/dL (ref 6–20)
BUN: 13 mg/dL (ref 6–20)
BUN: 15 mg/dL (ref 6–20)
BUN: 15 mg/dL (ref 6–20)
CO2: 23 mmol/L (ref 22–32)
CO2: 24 mmol/L (ref 22–32)
CO2: 24 mmol/L (ref 22–32)
CO2: 24 mmol/L (ref 22–32)
CO2: 25 mmol/L (ref 22–32)
CO2: 27 mmol/L (ref 22–32)
Calcium: 7.9 mg/dL — ABNORMAL LOW (ref 8.9–10.3)
Calcium: 7.9 mg/dL — ABNORMAL LOW (ref 8.9–10.3)
Calcium: 8 mg/dL — ABNORMAL LOW (ref 8.9–10.3)
Calcium: 8 mg/dL — ABNORMAL LOW (ref 8.9–10.3)
Calcium: 8.1 mg/dL — ABNORMAL LOW (ref 8.9–10.3)
Calcium: 8.2 mg/dL — ABNORMAL LOW (ref 8.9–10.3)
Chloride: 105 mmol/L (ref 98–111)
Chloride: 105 mmol/L (ref 98–111)
Chloride: 105 mmol/L (ref 98–111)
Chloride: 106 mmol/L (ref 98–111)
Chloride: 106 mmol/L (ref 98–111)
Chloride: 106 mmol/L (ref 98–111)
Creatinine, Ser: 1.07 mg/dL — ABNORMAL HIGH (ref 0.44–1.00)
Creatinine, Ser: 1.18 mg/dL — ABNORMAL HIGH (ref 0.44–1.00)
Creatinine, Ser: 1.21 mg/dL — ABNORMAL HIGH (ref 0.44–1.00)
Creatinine, Ser: 1.23 mg/dL — ABNORMAL HIGH (ref 0.44–1.00)
Creatinine, Ser: 1.23 mg/dL — ABNORMAL HIGH (ref 0.44–1.00)
Creatinine, Ser: 1.29 mg/dL — ABNORMAL HIGH (ref 0.44–1.00)
GFR, Estimated: 49 mL/min — ABNORMAL LOW (ref >60)
GFR, Estimated: 52 mL/min — ABNORMAL LOW (ref >60)
GFR, Estimated: 52 mL/min — ABNORMAL LOW (ref >60)
GFR, Estimated: 53 mL/min — ABNORMAL LOW (ref >60)
GFR, Estimated: 54 mL/min — ABNORMAL LOW (ref >60)
GFR, Estimated: >60 mL/min (ref >60)
Glucose, Bld: 130 mg/dL — ABNORMAL HIGH (ref 70–99)
Glucose, Bld: 165 mg/dL — ABNORMAL HIGH (ref 70–99)
Glucose, Bld: 170 mg/dL — ABNORMAL HIGH (ref 70–99)
Glucose, Bld: 185 mg/dL — ABNORMAL HIGH (ref 70–99)
Glucose, Bld: 225 mg/dL — ABNORMAL HIGH (ref 70–99)
Glucose, Bld: 79 mg/dL (ref 70–99)
Potassium: 3.7 mmol/L (ref 3.5–5.1)
Potassium: 3.7 mmol/L (ref 3.5–5.1)
Potassium: 3.8 mmol/L (ref 3.5–5.1)
Potassium: 3.8 mmol/L (ref 3.5–5.1)
Potassium: 3.8 mmol/L (ref 3.5–5.1)
Potassium: 4 mmol/L (ref 3.5–5.1)
Sodium: 138 mmol/L (ref 135–145)
Sodium: 138 mmol/L (ref 135–145)
Sodium: 138 mmol/L (ref 135–145)
Sodium: 138 mmol/L (ref 135–145)
Sodium: 139 mmol/L (ref 135–145)
Sodium: 139 mmol/L (ref 135–145)

## 2023-12-25 LAB — GLUCOSE, CAPILLARY
Glucose-Capillary: 80 mg/dL (ref 70–99)
Glucose-Capillary: 158 mg/dL — ABNORMAL HIGH (ref 70–99)
Glucose-Capillary: 152 mg/dL — ABNORMAL HIGH (ref 70–99)
Glucose-Capillary: 67 mg/dL — ABNORMAL LOW (ref 70–99)
Glucose-Capillary: 79 mg/dL (ref 70–99)
Glucose-Capillary: 174 mg/dL — ABNORMAL HIGH (ref 70–99)

## 2023-12-25 LAB — HEMOGLOBIN A1C
Hgb A1c MFr Bld: 11.3 % — ABNORMAL HIGH (ref 4.8–5.6)
Mean Plasma Glucose: 278 mg/dL

## 2023-12-25 MED ORDER — INSULIN GLARGINE-YFGN 100 UNIT/ML ~~LOC~~ SOLN
4.0000 [IU] | Freq: Two times a day (BID) | SUBCUTANEOUS | Status: DC
Start: 2023-12-25 — End: 2023-12-27
  Administered 2023-12-25 – 2023-12-27 (×4): 4 [IU] via SUBCUTANEOUS
  Filled 2023-12-25 (×6): qty 0.04

## 2023-12-25 MED ORDER — DEXTROSE IN LACTATED RINGERS 5 % IV SOLN: INTRAVENOUS | Status: AC

## 2023-12-25 MED ORDER — PANTOPRAZOLE SODIUM 40 MG PO TBEC
40.0000 mg | DELAYED_RELEASE_TABLET | Freq: Every day | ORAL | Status: DC
  Administered 2023-12-25 – 2023-12-26 (×2): 40 mg via ORAL
  Filled 2023-12-25 (×2): qty 1

## 2023-12-25 NOTE — Evaluation (Signed)
 Physical Therapy Evaluation Patient Details Name: Carolyn Norton MRN: [id number] DOB: 09-22-1965 Today's Date: 12/25/2023  History of Present Illness  58 yo female brought from [facility] 03/23/24  ED with altered mental status, respiratory distress, hyperglycemia, DKA, required intubation. Extubated 4/18 25. WUJ:[id number], NPH, VP shunt, HTN, anxiety, depression, CKD, DM. Patient DC'd to [facility] 2/25 after septic  shock.  Clinical Impression  Patient  has been in SNF x >2 months, appears to be transitioning to LTC. Patient is total care PTA. PT will sign off.       If plan is discharge home, recommend the following: Two people to help with walking and/or transfers;Two people to help with bathing/dressing/bathroom   Can travel by private vehicle        Equipment Recommendations None recommended by PT  Recommendations for Other Services       Functional Status Assessment Patient has not had a recent decline in their functional status     Precautions / Restrictions Precautions Precaution/Restrictions Comments: RUE painful Restrictions Weight Bearing Restrictions Per Provider Order: No      Mobility  Bed Mobility               General bed mobility comments: patient declined    Transfers                        Ambulation/Gait                  Stairs            Wheelchair Mobility     Tilt Bed    Modified Rankin (Stroke Patients Only)       Balance                                             Pertinent Vitals/Pain Pain Assessment Pain Assessment: Faces Faces Pain Scale: Hurts worst Pain Location: RUE and right toes Pain Descriptors / Indicators: Grimacing, Guarding    Home Living Family/patient expects to be discharged to:: Skilled nursing facility                   Additional Comments: bed bound, patient unable to state, transitioning to LTC per report. In 2/25 patient was ambulatory x 50'    Prior  Function Prior Level of Function : Patient poor historian/Family not available                     Extremity/Trunk Assessment   Upper Extremity Assessment Upper Extremity Assessment: RUE deficits/detail;Right hand dominant RUE Deficits / Details: patient did not allow therapist to move th arm, noted to be edematous RUE: Unable to fully assess due to pain    Lower Extremity Assessment Lower Extremity Assessment: RLE deficits/detail;LLE deficits/detail RLE Deficits / Details: leg positioned in ER, gross active movement in the leg and foot, lplantarflexion contracture LLE Deficits / Details: able to pick the leg from bed, lplantarflexion contracture, heel reddened    Cervical / Trunk Assessment Cervical / Trunk Assessment: Other exceptions Cervical / Trunk Exceptions: unable to test,  patient did not aloow therapist to assist with rolling nor attempts to sit up.  Communication   Communication Communication: No apparent difficulties    Cognition Arousal: Alert Behavior During Therapy: WFL for tasks assessed/performed   PT - Cognitive impairments: Orientation   Orientation impairments: Place,  Time, Situation                     Following commands: Impaired Following commands impaired: Follows one step commands inconsistently     Cueing       General Comments      Exercises     Assessment/Plan    PT Assessment Patient does not need any further PT services  PT Problem List         PT Treatment Interventions      PT Goals (Current goals can be found in the Care Plan section)  Acute Rehab PT Goals PT Goal Formulation: All assessment and education complete, DC therapy    Frequency       Co-evaluation               AM-PAC PT "6 Clicks" Mobility  Outcome Measure Help needed turning from your back to your side while in a flat bed without using bedrails?: Total Help needed moving from lying on your back to sitting on the side of a flat bed  without using bedrails?: Total Help needed moving to and from a bed to a chair (including a wheelchair)?: Total Help needed standing up from a chair using your arms (e.g., wheelchair or bedside chair)?: Total Help needed to walk in hospital room?: Total Help needed climbing 3-5 steps with a railing? : Total 6 Click Score: 6    End of Session   Activity Tolerance: Patient tolerated treatment well;Patient limited by pain Patient left: in bed;with call bell/phone within reach Nurse Communication: Mobility status;Need for lift equipment PT Visit Diagnosis: Muscle weakness (generalized) (M62.81);Pain;Other symptoms and signs involving the nervous system (R29.898) Pain - Right/Left: Right Pain - part of body: Arm    Time: 8295-6213 PT Time Calculation (min) (ACUTE ONLY): 15 min   Charges:   PT Evaluation $PT Eval Low Complexity: 1 Low   PT General Charges $$ ACUTE PT VISIT: 1 Visit         Abelina Hoes PT Acute Rehabilitation Services Office [phone] Weekend [phone]   Dareen Ebbing 12/25/2023, 1:13 PM

## 2023-12-25 NOTE — Progress Notes (Signed)
 NAME:  DESIA SABAN, MRN:  [id number], DOB:  [birth year], LOS: 2 ADMISSION DATE:  12/23/2023, CONSULTATION DATE:  12/23/2023 REFERRING MD:  Carylon Claude, EDP  CHIEF COMPLAINT:  AMS, hyperglycemia   History of Present Illness:  Information obtained from chart review and ED provider as patient is intubated.  58 year old chronically ill female with past medical history of bipolar, CKD I, diabetes mellitus, endometrial cancer, hyperlipidemia, hypertension, NPH s/p shunt who presents to [facility] ED on 4/17 with altered mental status, respiratory distress, hyperglycemia. Per report, patient deteriorated at nursing facility [facility]) over the past 2 hours. In ED, noted to be diaphoretic, kussmaul breathing, dry, disoriented. Initial stat labs with venous pH <6.95, pCO2 <18, bicarb 3.6, I-stat lactic 9.5, WBC 22.4. CXR with no obvious infiltrates. She was started on fluid boluses, insulin  drip. She had ongoing respiratory distress and began tiring out and was subsequently intubated. CCM consult for admission  I called and spoke with [facility] health facility who states that recently she has been refusing all medications including her insulin  and blood sugar checks.  They do not report any infectious symptoms such as fever, cough, nausea/vomiting/diarrhea.  They note her roommate stated she had been sleeping more over the past 2 days.  Blood glucose this morning at 7 AM was 208.  I also called and spoke with the father who states that since her moving to facility she has had multiple falls and is unsure if something is wrong with her VP shunt.  He does note that she has previously been admitted for DKA.  He has not seen her since Sunday.  Pertinent  Medical History  Alzheimer's disease, bipolar, CKD I, diabetes mellitus, endometrial cancer, hyperlipidemia, hypertension, NPH s/p shunt  Significant Hospital Events: Including procedures, antibiotic start and stop dates in addition to other pertinent events    4/17: ED for AMS, hyperglycemia, respiratory distress>intubated>ICU 4/18: plan to obtain CT head this AM followed by wean and hopefully extubate, will transition off gtt now that bg and ag are under control  4/19 extubated 4/18, doing well today awake alert interactive  Interim History / Subjective:   She is awake alert interactive Treated for DKA Improved  Objective   Blood pressure (!) 95/57, pulse 84, temperature 100 F (37.8 C), resp. rate 15, height 5\' 4"  (1.626 m), weight 81.3 kg, last menstrual period 01/26/2017, SpO2 97%.        Intake/Output Summary (Last 24 hours) at 12/25/2023 1340 Last data filed at 12/25/2023 1316 Gross per 24 hour  Intake 1834.78 ml  Output 775 ml  Net 1059.78 ml   Filed Weights   12/23/23 2045 12/24/23 0600 12/25/23 0407  Weight: 78.8 kg 79.5 kg 81.3 kg    Examination: General: Middle-aged, does not Petrem in distress HENT: Moist oral mucosa Lungs: Clear breath sounds bilaterally Cardiovascular: S1-S2 appreciated Abdomen: Bowel sounds appreciated Extremities: No extremity edema, some swelling right upper extremity Neuro: Alert oriented x 3 GU: Foley  Resolved Hospital Problem list   AGMA Hyperkalemia  Assessment & Plan:   DKA type II DM Electrolyte derangement improved - On regular diet - Continue insulin   Metabolic encephalopathy - Improved  Acute hypoxemic respiratory failure - Was extubated 12/24/2023 - No underlying lung disease  Urinary tract infection - Continue Rocephin   Had sepsis at presentation Septic shock - Was able to wean off pressors  Chronic kidney disease stage Ia - Avoid nephrotoxic medication  Bipolar disorder - Continue home Depakote   Normal pressure hydrocephalus s/p shunt placement -  CT head unremarkable  History of endometrial cancer Outpatient follow-up as scheduled    Best Practice (right click and "Reselect all SmartList Selections" daily)   Diet/type: Regular consistency (see  orders) DVT prophylaxis: prophylactic heparin   Pressure ulcer(s): Defer, see nursing GI prophylaxis: H2B Lines: Central line, Arterial Line, and yes and it is still needed Foley:  Yes, and it is still needed Code Status:  full code Last date of multidisciplinary goals of care discussion [called and updated father 4/17 who wants full code]  Labs   CBC: Recent Labs  Lab 12/23/23 1218 12/23/23 1222  WBC 22.4*  --   NEUTROABS 13.5*  --   HGB 14.3 16.3*  HCT 49.2* 48.0*  MCV 97.8  --   PLT 414*  --     Basic Metabolic Panel: Recent Labs  Lab 12/23/23 1218 12/23/23 1222 12/24/23 0904 12/24/23 1338 12/25/23 0029 12/25/23 0457 12/25/23 0846  NA 138   < > 141 141 138 139 138  K 5.2*   < > 3.3* 3.9 3.7 3.8 3.8  CL 96*   < > 107 107 106 106 105  CO2 <7*   < > 26 24 24 25 27   GLUCOSE 627*   < > 143* 139* 170* 130* 165*  BUN 31*   < > 22* 19 15 15 13   CREATININE 2.05*   < > 1.03* 1.00 1.23* 1.23* 1.21*  CALCIUM  9.8   < > 8.4* 8.3* 8.0* 8.1* 7.9*  MG 2.5*  --   --   --   --   --   --   PHOS 9.8*  --   --   --   --   --   --    < > = values in this interval not displayed.   GFR: Estimated Creatinine Clearance: 53.5 mL/min (A) (by C-G formula based on SCr of 1.21 mg/dL (H)). Recent Labs  Lab 12/23/23 1218 12/23/23 1222 12/23/23 1321 12/23/23 1702 12/24/23 0425  WBC 22.4*  --   --   --   --   LATICACIDVEN  --  9.5* 8.4* 7.1* 1.5    Liver Function Tests: Recent Labs  Lab 12/23/23 1218  AST 22  ALT 12  ALKPHOS 82  BILITOT 1.1  PROT 7.7  ALBUMIN 3.9   Recent Labs  Lab 12/23/23 1218  LIPASE 26   No results for input(s): "AMMONIA" in the last 168 hours.  ABG    Component Value Date/Time   PHART 7.57 (H) 12/23/2023 2326   PCO2ART 28 (L) 12/23/2023 2326   PO2ART 229 (H) 12/23/2023 2326   HCO3 26.0 12/23/2023 2326   TCO2 6 (L) 12/23/2023 1222   ACIDBASEDEF 7.5 (H) 12/23/2023 1800   O2SAT 99.2 12/23/2023 2326     Coagulation Profile: No results for  input(s): "INR", "PROTIME" in the last 168 hours.  Cardiac Enzymes: No results for input(s): "CKTOTAL", "CKMB", "CKMBINDEX", "TROPONINI" in the last 168 hours.  HbA1C: Hgb A1c MFr Bld  Date/Time Value Ref Range Status  12/24/2023 09:04 AM 11.3 (H) 4.8 - 5.6 % Final    Comment:    (NOTE)         Prediabetes: 5.7 - 6.4         Diabetes: >6.4         Glycemic control for adults with diabetes: <7.0   09/06/2023 11:54 AM 12.3 (H) 4.8 - 5.6 % Final    Comment:    (NOTE)  Prediabetes: 5.7 - 6.4         Diabetes: >6.4         Glycemic control for adults with diabetes: <7.0     CBG: Recent Labs  Lab 12/24/23 2049 12/24/23 2344 12/25/23 0316 12/25/23 0810 12/25/23 1141  GLUCAP 125* 166* 80 158* 152*    Review of Systems:   As above  Past Medical History:  She,  has a past medical history of Alzheimer disease (HCC), Anxiety, Asthma, Bipolar disorder (HCC), Cancer (HCC), Chronic kidney disease, COVID, Depression, Diabetes mellitus without complication (HCC), Dyspnea, Hyperlipidemia, Hypertension, Normal pressure hydrocephalus (HCC), and Seasonal allergies.   Surgical History:   Past Surgical History:  Procedure Laterality Date   BREAST EXCISIONAL BIOPSY Right    BREAST SURGERY     right breast   CATARACT EXTRACTION W/PHACO Left 02/12/2023   Procedure: CATARACT EXTRACTION PHACO AND INTRAOCULAR LENS PLACEMENT (IOC);  Surgeon: Tarri Farm, MD;  Location: [facility];  Service: Ophthalmology;  Laterality: Left;  CDE: 22.05   CATARACT EXTRACTION W/PHACO Right 02/26/2023   Procedure: CATARACT EXTRACTION PHACO AND INTRAOCULAR LENS PLACEMENT (IOC);  Surgeon: Tarri Farm, MD;  Location: [facility];  Service: Ophthalmology;  Laterality: Right;  CDE: 24.40   TOOTH EXTRACTION N/A 12/11/2022   Procedure: DENTAL RESTORATION/EXTRACTIONS;  Surgeon: Ascencion Lava, DMD;  Location: [facility] OR;  Service: Oral Surgery;  Laterality: N/A;   VENTRICULOPERITONEAL SHUNT N/A 05/29/2022   Procedure:  Ventriculoperitoneal shunt placement;  Surgeon: Gearl Keens, MD;  Location[facility];  Service: Neurosurgery;  Laterality: N/A;     Social History:   reports that she has been smoking cigarettes. She started smoking about a year ago. She has a 0.5 pack-year smoking history. She has never used smokeless tobacco. She reports that she does not currently use drugs after having used the following drugs: Cocaine. She reports that she does not drink alcohol.   Family History:  Her family history includes Breast cancer in her paternal grandmother; Hypertension in her mother; Kidney cancer in her mother; Leukemia in her brother; Prostate cancer in her father. There is no history of Endometrial cancer, Colon cancer, or Ovarian cancer.   Allergies Allergies  Allergen Reactions   Erythromycin Other (See Comments)    Childhood reaction     Myer Artis, MD  PCCM Pager: See Tilford Foley

## 2023-12-25 NOTE — Progress Notes (Signed)
 Right Aline discontinued per MD order, dressing applied, pulses present. No complications noted.

## 2023-12-25 NOTE — Progress Notes (Signed)
 Hypoglycemic Event  CBG: 67  Treatment: 8 oz juice/soda  Symptoms: None  Follow-up CBG: Time: 1637 CBG Result:79  Possible Reasons for Event: Inadequate meal intake  Comments/MD notified: Yes    [employer]

## 2023-12-25 NOTE — Progress Notes (Signed)
 Pt needs cues and redirection to follow commands to feed self. Pt is alert and oriented to self and place. Time is occasionally. Pt denies of pain at the moment.   Bed is locked in safe positron, bed alarm on, and call light with in reach.

## 2023-12-25 NOTE — Plan of Care (Signed)

## 2023-12-25 NOTE — Progress Notes (Signed)
 Hypoglycemic episodes noted  Did have a hypoglycemic episode yesterday as well on 4/18  Limited intake  Will place on D5 LR

## 2023-12-26 DIAGNOSIS — E1311 Other specified diabetes mellitus with ketoacidosis with coma: Secondary | ICD-10-CM

## 2023-12-26 LAB — GLUCOSE, CAPILLARY
Glucose-Capillary: 126 mg/dL — ABNORMAL HIGH (ref 70–99)
Glucose-Capillary: 155 mg/dL — ABNORMAL HIGH (ref 70–99)
Glucose-Capillary: 179 mg/dL — ABNORMAL HIGH (ref 70–99)
Glucose-Capillary: 188 mg/dL — ABNORMAL HIGH (ref 70–99)
Glucose-Capillary: 220 mg/dL — ABNORMAL HIGH (ref 70–99)
Glucose-Capillary: 75 mg/dL (ref 70–99)
Glucose-Capillary: 81 mg/dL (ref 70–99)

## 2023-12-26 LAB — BASIC METABOLIC PANEL WITH GFR
Anion gap: 9 (ref 5–15)
BUN: 9 mg/dL (ref 6–20)
CO2: 23 mmol/L (ref 22–32)
Calcium: 8.2 mg/dL — ABNORMAL LOW (ref 8.9–10.3)
Chloride: 106 mmol/L (ref 98–111)
Creatinine, Ser: 1.18 mg/dL — ABNORMAL HIGH (ref 0.44–1.00)
GFR, Estimated: 54 mL/min — ABNORMAL LOW (ref >60)
Glucose, Bld: 138 mg/dL — ABNORMAL HIGH (ref 70–99)
Potassium: 3.6 mmol/L (ref 3.5–5.1)
Sodium: 138 mmol/L (ref 135–145)

## 2023-12-26 LAB — AMMONIA: Ammonia: 31 umol/L (ref 9–35)

## 2023-12-26 LAB — TSH: TSH: 1.302 u[IU]/mL (ref 0.350–4.500)

## 2023-12-26 LAB — VALPROIC ACID LEVEL: Valproic Acid Lvl: 57 ug/mL (ref 50.0–100.0)

## 2023-12-26 MED ORDER — ENSURE ENLIVE PO LIQD
237.0000 mL | Freq: Two times a day (BID) | ORAL | Status: DC
Start: 2023-12-26 — End: 2023-12-27
  Administered 2023-12-26 – 2023-12-27 (×4): 237 mL via ORAL

## 2023-12-26 NOTE — Plan of Care (Signed)

## 2023-12-26 NOTE — Progress Notes (Signed)
 PT Cancellation Note / Screen  Patient Details Name: Carolyn Norton MRN: [id number] DOB: 04-09-1966   Cancelled Treatment:    Reason Eval/Treat Not Completed: PT screened, no needs identified, will sign off Pt evaluated by PT on 12/25/23.  Pt from SNF and appears to be transitioning to LTC. Patient is total care PTA. Per last TOC note, pt able to return to SNF.  PT will sign off.      Myna Asal Payson 12/26/2023, 8:47 AM Blanch Bunde, DPT Physical Therapist Acute Rehabilitation Services Office: [phone]

## 2023-12-26 NOTE — Progress Notes (Signed)
 PROGRESS NOTE    Carolyn Norton  ZOX:[id number] DOB: 01/05/1966 DOA: 12/23/2023 PCP: Dickey Fought, PA   Brief Narrative:58 year old chronically ill female with past medical history of bipolar, CKD I, diabetes mellitus, endometrial cancer, hyperlipidemia, hypertension, NPH s/p shunt who presents to [facility] ED on 4/17 with altered mental status, respiratory distress, hyperglycemia.  On admission she was disoriented diaphoretic with Kussmaul's breathing with a pH of less than 6.95PCO2 less than 18 lactic acid now 9.5 white count 22.4 bicarb 3.6.  She was admitted to ICU by PCCM treated with insulin  drip and fluid boluses.  She was intubated for respiratory distress.  Patient lives at [facility] health facility nursing home. She was intubated on the 17th and extubated on the 18th CT head showed no acute findings.  Assessment & Plan:   Principal Problem:   Diabetic ketoacidosis (HCC)  #1 DKA/type 2 diabetes uncontrolled patient has been refusing her medications at the facility prior to admission.  She was treated with insulin  drip.  DKA has resolved.  A1c 11.3 Semglee  40 units twice daily CBG (last 3)  Recent Labs    12/26/23 0416 12/26/23 0800 12/26/23 1212  GLUCAP 75 155* 220*    #2 acute hypoxic respiratory failure Status post intubation Chest x-ray no acute findings  #3 urinary tract infection on Rocephin  urine culture with insignificant growth DC Rocephin   #4 CKD stage1 stable  #5 bipolar disorder current continue Lamictal  and Depakote   #6 metabolic encephalopathy resolving  #7 NPH status post shunt placement CT head stable noncontrast appearance of the brain including right posterior approach ventriculostomy shunt with no adverse features  #8 history of endometrial cancer outpatient follow-up   #9 history of hypertension on metoprolol  prior to admission on hold due to soft blood pressure  Estimated body mass index is 31.64 kg/m as calculated from the following:    Height as of this encounter: 5\' 4"  (1.626 m).   Weight as of this encounter: 83.6 kg.  DVT prophylaxis:heparin  Code Status: full Family Communication: none Disposition Plan:  Status is: Inpatient Remains inpatient appropriate because: acute illness   Consultants: pccm  Procedures: none Antimicrobials: rocephin   Subjective: She is resting in bed in no acute distress she refers told me she lives at home with her parents and she takes care of her parents   Objective: Vitals:   12/25/23 1626 12/25/23 1920 12/26/23 0415 12/26/23 0417  BP:  110/67 125/66   Pulse: 90 (!) 104 88   Resp: 15 18 18    Temp: 99.9 F (37.7 C) 98.5 F (36.9 C) 97.7 F (36.5 C)   TempSrc:  Oral Oral   SpO2: 100% 97% 98%   Weight:    83.6 kg  Height:        Intake/Output Summary (Last 24 hours) at 12/26/2023 1339 Last data filed at 12/26/2023 0448 Gross per 24 hour  Intake --  Output 1675 ml  Net -1675 ml   Filed Weights   12/24/23 0600 12/25/23 0407 12/26/23 0417  Weight: 79.5 kg 81.3 kg 83.6 kg    Examination:  General exam: Appears in no acute distress Respiratory system: Clear to auscultation. Respiratory effort normal. Cardiovascular system: S1 & S2 heard, RRR. No JVD, murmurs, rubs, gallops or clicks. No pedal edema. Gastrointestinal system: Abdomen is nondistended, soft and nontender. No organomegaly or masses felt. Normal bowel sounds heard. Central nervous system: Confused at times extremities: No edema  Data Reviewed: I have personally reviewed following labs and imaging studies  CBC: Recent Labs  Lab 12/23/23 1218 12/23/23 1222  WBC 22.4*  --   NEUTROABS 13.5*  --   HGB 14.3 16.3*  HCT 49.2* 48.0*  MCV 97.8  --   PLT 414*  --    Basic Metabolic Panel: Recent Labs  Lab 12/23/23 1218 12/23/23 1222 12/25/23 0846 12/25/23 1322 12/25/23 1642 12/25/23 2035 12/26/23 0016  NA 138   < > 138 138 139 138 138  K 5.2*   < > 3.8 3.7 3.8 4.0 3.6  CL 96*   < > 105 105 106 105  106  CO2 <7*   < > 27 23 24 24 23   GLUCOSE 627*   < > 165* 185* 79 225* 138*  BUN 31*   < > 13 11 11 10 9   CREATININE 2.05*   < > 1.21* 1.07* 1.18* 1.29* 1.18*  CALCIUM  9.8   < > 7.9* 8.0* 8.2* 7.9* 8.2*  MG 2.5*  --   --   --   --   --   --   PHOS 9.8*  --   --   --   --   --   --    < > = values in this interval not displayed.   GFR: Estimated Creatinine Clearance: 55.7 mL/min (A) (by C-G formula based on SCr of 1.18 mg/dL (H)). Liver Function Tests: Recent Labs  Lab 12/23/23 1218  AST 22  ALT 12  ALKPHOS 82  BILITOT 1.1  PROT 7.7  ALBUMIN 3.9   Recent Labs  Lab 12/23/23 1218  LIPASE 26   No results for input(s): "AMMONIA" in the last 168 hours. Coagulation Profile: No results for input(s): "INR", "PROTIME" in the last 168 hours. Cardiac Enzymes: No results for input(s): "CKTOTAL", "CKMB", "CKMBINDEX", "TROPONINI" in the last 168 hours. BNP (last 3 results) No results for input(s): "PROBNP" in the last 8760 hours. HbA1C: Recent Labs    12/24/23 0904  HGBA1C 11.3*   CBG: Recent Labs  Lab 12/25/23 1919 12/26/23 0019 12/26/23 0416 12/26/23 0800 12/26/23 1212  GLUCAP 174* 126* 75 155* 220*   Lipid Profile: Recent Labs    12/24/23 0425  TRIG 163*   Thyroid Function Tests: No results for input(s): "TSH", "T4TOTAL", "FREET4", "T3FREE", "THYROIDAB" in the last 72 hours. Anemia Panel: No results for input(s): "VITAMINB12", "FOLATE", "FERRITIN", "TIBC", "IRON", "RETICCTPCT" in the last 72 hours. Sepsis Labs: Recent Labs  Lab 12/23/23 1222 12/23/23 1321 12/23/23 1702 12/24/23 0425  LATICACIDVEN 9.5* 8.4* 7.1* 1.5    Recent Results (from the past 240 hours)  Culture, blood (routine x 2)     Status: None (Preliminary result)   Collection Time: 12/23/23 12:13 PM   Specimen: BLOOD  Result Value Ref Range Status   Specimen Description   Final    BLOOD LEFT ANTECUBITAL Performed at [facility], [address], [place], [place]  [address]    Special Requests   Final    BOTTLES DRAWN AEROBIC AND ANAEROBIC Blood Culture adequate volume Performed at [facility], [address] [place], [place] [address]    Culture   Final    NO GROWTH 3 DAYS Performed at [facility], [address], [place], [place] [address]    Report Status PENDING  Incomplete  Culture, blood (routine x 2)     Status: None (Preliminary result)   Collection Time: 12/23/23 12:18 PM   Specimen: BLOOD LEFT HAND  Result Value Ref Range Status   Specimen Description  Final    BLOOD LEFT HAND Performed at [facility], [address] [place], [place] [address]    Special Requests   Final    BOTTLES DRAWN AEROBIC AND ANAEROBIC Blood Culture results may not be optimal due to an excessive volume of blood received in culture bottles Performed at [facility], [address], [place], [place] [address]    Culture   Final    NO GROWTH 3 DAYS Performed at [facility], [address], [place], [place] [address]    Report Status PENDING  Incomplete  Urine Culture (for pregnant, neutropenic or urologic patients or patients with an indwelling urinary catheter)     Status: Abnormal   Collection Time: 12/23/23  1:17 PM   Specimen: Urine, Catheterized  Result Value Ref Range Status   Specimen Description   Final    URINE, CATHETERIZED Performed at [facility], [address] [place], [place] [address]    Special Requests   Final    NONE Performed at [facility], [address] [place], [place] [address]    Culture (A)  Final    <10,000 COLONIES/mL INSIGNIFICANT GROWTH Performed at [facility], [address], [place], [place] [address]    Report Status 12/24/2023 FINAL  Final  Resp panel by RT-PCR (RSV, Flu A&B, Covid) Anterior Nasal Swab     Status: None   Collection Time: 12/23/23  7:01 PM   Specimen: Anterior Nasal Swab  Result Value Ref  Range Status   SARS Coronavirus 2 by RT PCR NEGATIVE NEGATIVE Final    Comment: (NOTE) SARS-CoV-2 target nucleic acids are NOT DETECTED.  The SARS-CoV-2 RNA is generally detectable in upper respiratory specimens during the acute phase of infection. The lowest concentration of SARS-CoV-2 viral copies this assay can detect is 138 copies/mL. A negative result does not preclude SARS-Cov-2 infection and should not be used as the sole basis for treatment or other patient management decisions. A negative result may occur with  improper specimen collection/handling, submission of specimen other than nasopharyngeal swab, presence of viral mutation(s) within the areas targeted by this assay, and inadequate number of viral copies(<138 copies/mL). A negative result must be combined with clinical observations, patient history, and epidemiological information. The expected result is Negative.  Fact Sheet for Patients:  BloggerCourse.com  Fact Sheet for Healthcare Providers:  SeriousBroker.it  This test is no t yet approved or cleared by the [place]  FDA and  has been authorized for detection and/or diagnosis of SARS-CoV-2 by FDA under an Emergency Use Authorization (EUA). This EUA will remain  in effect (meaning this test can be used) for the duration of the COVID-19 declaration under Section 564(b)(1) of the Act, 21 U.S.C.section 360bbb-3(b)(1), unless the authorization is terminated  or revoked sooner.       Influenza A by PCR NEGATIVE NEGATIVE Final   Influenza B by PCR NEGATIVE NEGATIVE Final    Comment: (NOTE) The Xpert Xpress SARS-CoV-2/FLU/RSV plus assay is intended as an aid in the diagnosis of influenza from Nasopharyngeal swab specimens and should not be used as a sole basis for treatment. Nasal washings and aspirates are unacceptable for Xpert Xpress SARS-CoV-2/FLU/RSV testing.  Fact Sheet for  Patients: BloggerCourse.com  Fact Sheet for Healthcare Providers: SeriousBroker.it  This test is not yet approved or cleared by the [place]  FDA and has been authorized for detection and/or diagnosis of SARS-CoV-2 by FDA under an Emergency Use  Authorization (EUA). This EUA will remain in effect (meaning this test can be used) for the duration of the COVID-19 declaration under Section 564(b)(1) of the Act, 21 U.S.C. section 360bbb-3(b)(1), unless the authorization is terminated or revoked.     Resp Syncytial Virus by PCR NEGATIVE NEGATIVE Final    Comment: (NOTE) Fact Sheet for Patients: BloggerCourse.com  Fact Sheet for Healthcare Providers: SeriousBroker.it  This test is not yet approved or cleared by the [place]  FDA and has been authorized for detection and/or diagnosis of SARS-CoV-2 by FDA under an Emergency Use Authorization (EUA). This EUA will remain in effect (meaning this test can be used) for the duration of the COVID-19 declaration under Section 564(b)(1) of the Act, 21 U.S.C. section 360bbb-3(b)(1), unless the authorization is terminated or revoked.  Performed at [facility], [address] [place], [place] [address]   MRSA Next Gen by PCR, Nasal     Status: None   Collection Time: 12/23/23  8:31 PM   Specimen: Nasal Mucosa; Nasal Swab  Result Value Ref Range Status   MRSA by PCR Next Gen NOT DETECTED NOT DETECTED Final    Comment: (NOTE) The GeneXpert MRSA Assay (FDA approved for NASAL specimens only), is one component of a comprehensive MRSA colonization surveillance program. It is not intended to diagnose MRSA infection nor to guide or monitor treatment for MRSA infections. Test performance is not FDA approved in patients less than 7 years old. Performed at [facility], [address] [place], [place]  [address]      Radiology Studies: No results found.   Scheduled Meds:  atorvastatin   20 mg Oral Daily   Chlorhexidine  Gluconate Cloth  6 each Topical Daily   divalproex   250 mg Oral Q8H   feeding supplement  237 mL Oral BID BM   heparin   5,000 Units Subcutaneous Q8H   insulin  aspart  2-6 Units Subcutaneous Q4H   insulin  glargine-yfgn  4 Units Subcutaneous BID   lamoTRIgine   50 mg Oral BID   pantoprazole   40 mg Oral QHS   Continuous Infusions:  cefTRIAXone  (ROCEPHIN )  IV 2 g (12/25/23 1743)   dextrose  5% lactated ringers  50 mL/hr at 12/25/23 1744     LOS: 3 days    Time spent: 39 min  Carolyn Lew, MD 12/26/2023, 1:39 PM

## 2023-12-27 DIAGNOSIS — E1311 Other specified diabetes mellitus with ketoacidosis with coma: Secondary | ICD-10-CM | POA: Diagnosis not present

## 2023-12-27 LAB — COMPREHENSIVE METABOLIC PANEL WITH GFR
ALT: 11 U/L (ref 0–44)
AST: 18 U/L (ref 15–41)
Albumin: 2.5 g/dL — ABNORMAL LOW (ref 3.5–5.0)
Alkaline Phosphatase: 56 U/L (ref 38–126)
Anion gap: 8 (ref 5–15)
BUN: 7 mg/dL (ref 6–20)
CO2: 26 mmol/L (ref 22–32)
Calcium: 8.3 mg/dL — ABNORMAL LOW (ref 8.9–10.3)
Chloride: 103 mmol/L (ref 98–111)
Creatinine, Ser: 0.86 mg/dL (ref 0.44–1.00)
GFR, Estimated: >60 mL/min (ref >60)
Glucose, Bld: 158 mg/dL — ABNORMAL HIGH (ref 70–99)
Potassium: 3.8 mmol/L (ref 3.5–5.1)
Sodium: 137 mmol/L (ref 135–145)
Total Bilirubin: 0.6 mg/dL (ref 0.0–1.2)
Total Protein: 5.5 g/dL — ABNORMAL LOW (ref 6.5–8.1)

## 2023-12-27 LAB — CBC
HCT: 38.2 % (ref 36.0–46.0)
Hemoglobin: 12 g/dL (ref 12.0–15.0)
MCH: 28.6 pg (ref 26.0–34.0)
MCHC: 31.4 g/dL (ref 30.0–36.0)
MCV: 91 fL (ref 80.0–100.0)
Platelets: 188 10*3/uL (ref 150–400)
RBC: 4.2 MIL/uL (ref 3.87–5.11)
RDW: 17.2 % — ABNORMAL HIGH (ref 11.5–15.5)
WBC: 4.7 10*3/uL (ref 4.0–10.5)
nRBC: 0 % (ref 0.0–0.2)

## 2023-12-27 LAB — GLUCOSE, CAPILLARY
Glucose-Capillary: 78 mg/dL (ref 70–99)
Glucose-Capillary: 121 mg/dL — ABNORMAL HIGH (ref 70–99)
Glucose-Capillary: 207 mg/dL — ABNORMAL HIGH (ref 70–99)

## 2023-12-27 MED ORDER — INSULIN GLARGINE-YFGN 100 UNIT/ML ~~LOC~~ SOLN: 4.0000 [IU] | Freq: Two times a day (BID) | SUBCUTANEOUS | 11 refills | Status: DC

## 2023-12-27 MED ORDER — DOCUSATE SODIUM 100 MG PO CAPS: 100.0000 mg | ORAL_CAPSULE | Freq: Two times a day (BID) | ORAL | 0 refills | Status: AC | PRN

## 2023-12-27 NOTE — Discharge Summary (Signed)
 Physician Discharge Summary  Carolyn [place] NUU:[id number] DOB: Jan 18, 1966 DOA: 12/23/2023  PCP: Dickey Fought, PA  Admit date: 12/23/2023 Discharge date: 12/27/2023  Admitted From: Nursing home Disposition: Nursing home Recommendations for Outpatient Follow-up:  Follow up with PCP in 1-2 weeks Please obtain BMP/CBC in one week  Home Health: None Equipment/Devices: None  Discharge Condition: Stable CODE STATUS: Full Diet recommendation: Cardiac Brief/Interim Summary:  58 year old chronically ill female with past medical history of bipolar, CKD I, diabetes mellitus, endometrial cancer, hyperlipidemia, hypertension, NPH s/p shunt who presents to [facility] ED on 4/17 with altered mental status, respiratory distress, hyperglycemia.  On admission she was disoriented diaphoretic with Kussmaul's breathing with a pH of less than 6.95PCO2 less than 18 lactic acid now 9.5 white count 22.4 bicarb 3.6.  She was admitted to ICU by PCCM treated with insulin  drip and fluid boluses.  She was intubated for respiratory distress.  Patient lives at [facility] nursing home. She was intubated on the 17th and extubated on the 18th CT head showed no acute findings  Discharge Diagnoses:  Principal Problem:   Diabetic ketoacidosis (HCC)     #1 DKA/type 2 diabetes uncontrolled patient has been refusing her medications at the facility prior to admission.  She was treated with insulin  drip.  DKA has resolved.  A1c 11.3 Semglee  4 units twice daily Continue the facility sliding scale insulin  protocol CBG (last 3)  Recent Labs (last 2 labs)       Recent Labs    12/26/23 0416 12/26/23 0800 12/26/23 1212  GLUCAP 75 155* 220*        #2 acute hypoxic respiratory failure Status post intubation Chest x-ray no acute findings   #3 urinary tract infection she was initially treated with Rocephin  but urine culture came back with no significant growth Rocephin  stopped on discharge.     #4 CKD  stage1 stable   #5 bipolar disorder current continue Lamictal  and Depakote    #6 metabolic encephalopathy she is back to her baseline   #7 NPH status post shunt placement CT head stable noncontrast appearance of the brain including right posterior approach ventriculostomy shunt with no adverse features   #8 history of endometrial cancer outpatient follow-up    #9 history of hypertension her blood pressure remained normal to soft here so at the time of discharge I have stopped both Norvasc  and metoprolol .  This may need to be restarted at some point please check her vitals regularly and restart as appropriate.    Estimated body mass index is 31.37 kg/m as calculated from the following:   Height as of this encounter: 5\' 4"  (1.626 m).   Weight as of this encounter: 82.9 kg.  Discharge Instructions  Discharge Instructions     Diet - low sodium heart healthy   Complete by: As directed    Increase activity slowly   Complete by: As directed    No wound care   Complete by: As directed       Allergies as of 12/27/2023       Reactions   Erythromycin Other (See Comments)   Childhood reaction        Medication List     STOP taking these medications    amLODipine  5 MG tablet Commonly known as: NORVASC    insulin  glargine 100 UNIT/ML Solostar Pen Commonly known as: LANTUS    metoprolol  tartrate 25 MG tablet Commonly known as: LOPRESSOR        TAKE these medications    albuterol  108 (  90 Base) MCG/ACT inhaler Commonly known as: VENTOLIN  HFA Inhale 2 puffs into the lungs See admin instructions. 2 puffs every 4-6 hours as needed for wheezing and SOB   atorvastatin  20 MG tablet Commonly known as: LIPITOR Take 1 tablet (20 mg total) by mouth daily.   Cholecalciferol  25 MCG (1000 UT) tablet Take 1,000 Units by mouth daily.   dapagliflozin propanediol 5 MG Tabs tablet Commonly known as: FARXIGA Take 5 mg by mouth in the morning and at bedtime.   divalproex  250 MG DR  tablet Commonly known as: DEPAKOTE  Take 250 mg by mouth 3 (three) times daily.   docusate sodium  100 MG capsule Commonly known as: COLACE Take 1 capsule (100 mg total) by mouth 2 (two) times daily as needed for mild constipation.   guaiFENesin 100 MG/5ML liquid Commonly known as: ROBITUSSIN Take 5 mLs by mouth every 4 (four) hours as needed for cough or to loosen phlegm.   insulin  aspart 100 UNIT/ML injection Commonly known as: novoLOG  Inject 0-20 Units into the skin 3 (three) times daily with meals. CBG 70 - 120: 0 units CBG 121 - 150: 0 units CBG 151 - 200: 0 units CBG 201 - 250: 2 units CBG 251 - 300: 3 units CBG 301 - 350: 4 units CBG 351 - 400: 5 units CBG > 400: call MD and obtain STAT lab verification What changed:  how much to take additional instructions   insulin  glargine-yfgn 100 UNIT/ML injection Commonly known as: SEMGLEE  Inject 0.04 mLs (4 Units total) into the skin 2 (two) times daily.   lamoTRIgine  25 MG tablet Commonly known as: LAMICTAL  Take 50 mg by mouth 2 (two) times daily.   megestrol  20 MG tablet Commonly known as: MEGACE  Take 40 mg by mouth daily.   multivitamin with minerals tablet Take 1 tablet by mouth daily.   ondansetron  4 MG tablet Commonly known as: ZOFRAN  Take 4 mg by mouth every 6 (six) hours as needed for nausea or vomiting.   pantoprazole  40 MG tablet Commonly known as: PROTONIX  Take 40 mg by mouth daily.   polyethylene glycol 17 g packet Commonly known as: MIRALAX  / GLYCOLAX  Take 17 g by mouth daily. What changed:  when to take this reasons to take this        Follow-up Information     Dickey Fought, [place] Follow up.   Specialty: Physician Assistant Contact information: [address] [place] [address] [place] [address] [phone]                Allergies  Allergen Reactions   Erythromycin Other (See Comments)    Childhood reaction    Consultations:PCCM   Procedures/Studies: CT HEAD WO CONTRAST  ( ) Result Date: 12/24/2023 CLINICAL DATA:  58 year old female with altered mental status. History of NPH and shunt. EXAM: CT HEAD WITHOUT CONTRAST TECHNIQUE: Contiguous axial images were obtained from the base of the skull through the vertex without intravenous contrast. RADIATION DOSE REDUCTION: This exam was performed according to the departmental dose-optimization program which includes automated exposure control, adjustment of the mA and/or kV according to patient size and/or use of iterative reconstruction technique. COMPARISON:  Brain MRI 04/01/2021, head CT 10/06/2023. FINDINGS: Brain: Right posterior approach ventriculostomy catheter is stable since January, communicates with the atrium of the right lateral ventricle on coronal image 41. Terminates in the midline as before. Stable ventricle size and configuration. No midline shift, mass effect, or evidence of intracranial mass lesion. No acute intracranial hemorrhage identified. Stable chronic  right anterior superior frontal gyrus encephalomalacia, patchy bilateral white matter hypodensity. No cortically based acute infarct identified. Vascular: No suspicious intracranial vascular hyperdensity. Calcified atherosclerosis at the skull base. Skull: Stable.  No acute osseous abnormality identified. Sinuses/Orbits: Visualized paranasal sinuses and mastoids are stable and well aerated. Other: Right posterior convexity shunt reservoir and tubing appears stable with no adverse features. Stable orbits. IMPRESSION: Stable non contrast CT appearance of the brain, including right posterior approach ventriculostomy shunt with no adverse features. Electronically Signed   By: Marlise Simpers M.D.   On: 12/24/2023 11:15   DG CHEST PORT 1 VIEW Result Date: 12/23/2023 CLINICAL DATA:  Central line placement EXAM: PORTABLE CHEST 1 VIEW COMPARISON:  12/23/2023 FINDINGS: Right internal jugular central venous catheter tip is seen within the right atrium roughly 5 cm below the  superior cavoatrial junction. Endotracheal tube seen 2.7 cm above the carina. Nasogastric tube tip overlies the proximal body of the stomach. Lung volumes are small, but are stable since prior examination. Lungs are clear. No pneumothorax or pleural effusion. Cardiac size within normal limits. No acute bone abnormality. IMPRESSION: 1. Right internal jugular central venous catheter tip within the right atrium roughly 5 cm below the superior cavoatrial junction. No pneumothorax. Electronically Signed   By: Worthy Heads M.D.   On: 12/23/2023 20:30   DG Chest 1 View Result Date: 12/23/2023 CLINICAL DATA:  Shortness of breath EXAM: CHEST  1 VIEW COMPARISON:  Chest x-ray 10/30/2023 FINDINGS: Endotracheal tube tip is 2.6 cm above the carina. Enteric tube tip is in the body of the stomach. The cardiomediastinal silhouette is within normal limits. There is a small band of atelectasis in the right mid lung. The lungs are otherwise clear. There is no pleural effusion or pneumothorax. Healed right-sided rib fractures are seen. IMPRESSION: 1. Endotracheal tube tip is 2.6 cm above the carina. 2. Enteric tube tip is in the body of the stomach. 3. Small band of atelectasis in the right mid lung. Electronically Signed   By: Tyron Gallon M.D.   On: 12/23/2023 14:41   (Echo, Carotid, EGD, Colonoscopy, ERCP)    Subjective:  NO EVENTS overnight Discharge Exam: Vitals:   12/26/23 2031 12/27/23 0438  BP: 116/75 126/73  Pulse: 89 80  Resp: 16 16  Temp: 97.8 F (36.6 C) 97.8 F (36.6 C)  SpO2: 97% 98%   Vitals:   12/26/23 1412 12/26/23 2031 12/27/23 0438 12/27/23 0500  BP: (!) 134/58 116/75 126/73   Pulse: 96 89 80   Resp: 18 16 16    Temp: 97.6 F (36.4 C) 97.8 F (36.6 C) 97.8 F (36.6 C)   TempSrc:  Oral Oral   SpO2: 95% 97% 98%   Weight:   84.4 kg 82.9 kg  Height:        General: Pt is alert, awake, not in acute distress Cardiovascular: RRR, S1/S2 +, no rubs, no gallops Respiratory: CTA  bilaterally, no wheezing, no rhonchi Abdominal: Soft, NT, ND, bowel sounds + Extremities: no edema, no cyanosis    The results of significant diagnostics from this hospitalization (including imaging, microbiology, ancillary and laboratory) are listed below for reference.     Microbiology: Recent Results (from the past 240 hours)  Culture, blood (routine x 2)     Status: None (Preliminary result)   Collection Time: 12/23/23 12:13 PM   Specimen: BLOOD  Result Value Ref Range Status   Specimen Description   Final    BLOOD LEFT ANTECUBITAL Performed at [facility], [address], [place], [place] [address]    Special Requests   Final    BOTTLES DRAWN AEROBIC AND ANAEROBIC Blood Culture adequate volume Performed at [facility], [address], [place], [place] [address]    Culture   Final    NO GROWTH 4 DAYS Performed at [facility], [address], [place], [place] [address]    Report Status PENDING  Incomplete  Culture, blood (routine x 2)     Status: None (Preliminary result)   Collection Time: 12/23/23 12:18 PM   Specimen: BLOOD LEFT HAND  Result Value Ref Range Status   Specimen Description   Final    BLOOD LEFT HAND Performed at [facility], [address], [place], [place] [address]    Special Requests   Final    BOTTLES DRAWN AEROBIC AND ANAEROBIC Blood Culture results may not be optimal due to an excessive volume of blood received in culture bottles Performed at [facility], [address], [place], [place] [address]    Culture   Final    NO GROWTH 4 DAYS Performed at [facility], [address], [place], [place] [address]    Report Status PENDING  Incomplete  Urine Culture (for pregnant, neutropenic or urologic patients or patients with an indwelling urinary catheter)     Status: Abnormal   Collection Time: 12/23/23  1:17 PM   Specimen: Urine, Catheterized  Result Value Ref Range  Status   Specimen Description   Final    URINE, CATHETERIZED Performed at [facility], [address], [place], [place] [address]    Special Requests   Final    NONE Performed at [facility], [address] [place], [place] [address]    Culture (A)  Final    <10,000 COLONIES/mL INSIGNIFICANT GROWTH Performed at [facility], [address], [place], [place] [address]    Report Status 12/24/2023 FINAL  Final  Resp panel by RT-PCR (RSV, Flu A&B, Covid) Anterior Nasal Swab     Status: None   Collection Time: 12/23/23  7:01 PM   Specimen: Anterior Nasal Swab  Result Value Ref Range Status   SARS Coronavirus 2 by RT PCR NEGATIVE NEGATIVE Final    Comment: (NOTE) SARS-CoV-2 target nucleic acids are NOT DETECTED.  The SARS-CoV-2 RNA is generally detectable in upper respiratory specimens during the acute phase of infection. The lowest concentration of SARS-CoV-2 viral copies this assay can detect is 138 copies/mL. A negative result does not preclude SARS-Cov-2 infection and should not be used as the sole basis for treatment or other patient management decisions. A negative result may occur with  improper specimen collection/handling, submission of specimen other than nasopharyngeal swab, presence of viral mutation(s) within the areas targeted by this assay, and inadequate number of viral copies(<138 copies/mL). A negative result must be combined with clinical observations, patient history, and epidemiological information. The expected result is Negative.  Fact Sheet for Patients:  BloggerCourse.com  Fact Sheet for Healthcare Providers:  SeriousBroker.it  This test is no t yet approved or cleared by the [place]  FDA and  has been authorized for detection and/or diagnosis of SARS-CoV-2 by FDA under an Emergency Use Authorization (EUA). This EUA will remain  in effect (meaning this  test can be used) for the duration of the COVID-19 declaration under Section 564(b)(1) of the Act, 21 U.S.C.section 360bbb-3(b)(1), unless the authorization is terminated  or revoked sooner.       Influenza A by PCR NEGATIVE NEGATIVE Final   Influenza B by PCR NEGATIVE NEGATIVE Final    Comment: (NOTE) The Xpert Xpress SARS-CoV-2/FLU/RSV plus assay is intended as an aid in the diagnosis of influenza from Nasopharyngeal swab specimens and should not be used as a sole basis for treatment. Nasal washings and aspirates are unacceptable for Xpert Xpress SARS-CoV-2/FLU/RSV testing.  Fact Sheet for Patients: BloggerCourse.com  Fact Sheet for Healthcare Providers: SeriousBroker.it  This test is not yet approved or cleared by the [place]  FDA and has been authorized for detection and/or diagnosis of SARS-CoV-2 by FDA under an Emergency Use Authorization (EUA). This EUA will remain in effect (meaning this test can be used) for the duration of the COVID-19 declaration under Section 564(b)(1) of the Act, 21 U.S.C. section 360bbb-3(b)(1), unless the authorization is terminated or revoked.     Resp Syncytial Virus by PCR NEGATIVE NEGATIVE Final    Comment: (NOTE) Fact Sheet for Patients: BloggerCourse.com  Fact Sheet for Healthcare Providers: SeriousBroker.it  This test is not yet approved or cleared by the [place]  FDA and has been authorized for detection and/or diagnosis of SARS-CoV-2 by FDA under an Emergency Use Authorization (EUA). This EUA will remain in effect (meaning this test can be used) for the duration of the COVID-19 declaration under Section 564(b)(1) of the Act, 21 U.S.C. section 360bbb-3(b)(1), unless the authorization is terminated or revoked.  Performed at [facility], [address], [place], [place] [address]   MRSA Next Gen by PCR,  Nasal     Status: None   Collection Time: 12/23/23  8:31 PM   Specimen: Nasal Mucosa; Nasal Swab  Result Value Ref Range Status   MRSA by PCR Next Gen NOT DETECTED NOT DETECTED Final    Comment: (NOTE) The GeneXpert MRSA Assay (FDA approved for NASAL specimens only), is one component of a comprehensive MRSA colonization surveillance program. It is not intended to diagnose MRSA infection nor to guide or monitor treatment for MRSA infections. Test performance is not FDA approved in patients less than 58 years old. Performed at [facility], [address], [place], [place] [address]      Labs: BNP (last 3 results) No results for input(s): "BNP" in the last 8760 hours. Basic Metabolic Panel: Recent Labs  Lab 12/23/23 1218 12/23/23 1222 12/25/23 0846 12/25/23 1322 12/25/23 1642 12/25/23 2035 12/26/23 0016  NA 138   < > 138 138 139 138 138  K 5.2*   < > 3.8 3.7 3.8 4.0 3.6  CL 96*   < > 105 105 106 105 106  CO2 <7*   < > 27 23 24 24 23   GLUCOSE 627*   < > 165* 185* 79 225* 138*  BUN 31*   < > 13 11 11 10 9   CREATININE 2.05*   < > 1.21* 1.07* 1.18* 1.29* 1.18*  CALCIUM  9.8   < > 7.9* 8.0* 8.2* 7.9* 8.2*  MG 2.5*  --   --   --   --   --   --   PHOS 9.8*  --   --   --   --   --   --    < > = values in this interval not displayed.   Liver Function Tests: Recent Labs  Lab 12/23/23 1218  AST 22  ALT 12  ALKPHOS 82  BILITOT 1.1  PROT 7.7  ALBUMIN 3.9  Recent Labs  Lab 12/23/23 1218  LIPASE 26   Recent Labs  Lab 12/26/23 1535  AMMONIA 31   CBC: Recent Labs  Lab 12/23/23 1218 12/23/23 1222  WBC 22.4*  --   NEUTROABS 13.5*  --   HGB 14.3 16.3*  HCT 49.2* 48.0*  MCV 97.8  --   PLT 414*  --    Cardiac Enzymes: No results for input(s): "CKTOTAL", "CKMB", "CKMBINDEX", "TROPONINI" in the last 168 hours. BNP: Invalid input(s): "POCBNP" CBG: Recent Labs  Lab 12/26/23 1732 12/26/23 2031 12/26/23 2343 12/27/23 0346 12/27/23 0745   GLUCAP 188* 179* 81 78 121*   D-Dimer No results for input(s): "DDIMER" in the last 72 hours. Hgb A1c No results for input(s): "HGBA1C" in the last 72 hours. Lipid Profile No results for input(s): "CHOL", "HDL", "LDLCALC", "TRIG", "CHOLHDL", "LDLDIRECT" in the last 72 hours. Thyroid function studies Recent Labs    12/26/23 1535  TSH 1.302   Anemia work up No results for input(s): "VITAMINB12", "FOLATE", "FERRITIN", "TIBC", "IRON", "RETICCTPCT" in the last 72 hours. Urinalysis    Component Value Date/Time   COLORURINE YELLOW 12/23/2023 1317   APPEARANCEUR CLOUDY (A) 12/23/2023 1317   LABSPEC 1.021 12/23/2023 1317   PHURINE 5.0 12/23/2023 1317   GLUCOSEU >=500 (A) 12/23/2023 1317   HGBUR SMALL (A) 12/23/2023 1317   BILIRUBINUR NEGATIVE 12/23/2023 1317   KETONESUR 80 (A) 12/23/2023 1317   PROTEINUR 100 (A) 12/23/2023 1317   UROBILINOGEN 0.2 07/20/2014 2022   NITRITE NEGATIVE 12/23/2023 1317   LEUKOCYTESUR LARGE (A) 12/23/2023 1317   Sepsis Labs Recent Labs  Lab 12/23/23 1218  WBC 22.4*   Microbiology Recent Results (from the past 240 hours)  Culture, blood (routine x 2)     Status: None (Preliminary result)   Collection Time: 12/23/23 12:13 PM   Specimen: BLOOD  Result Value Ref Range Status   Specimen Description   Final    BLOOD LEFT ANTECUBITAL Performed at [facility], [address] [place], [place] [address]    Special Requests   Final    BOTTLES DRAWN AEROBIC AND ANAEROBIC Blood Culture adequate volume Performed at [facility], [address], [place], [place] [address]    Culture   Final    NO GROWTH 4 DAYS Performed at [facility], [address], [place], [place] [address]    Report Status PENDING  Incomplete  Culture, blood (routine x 2)     Status: None (Preliminary result)   Collection Time: 12/23/23 12:18 PM   Specimen: BLOOD LEFT HAND  Result Value Ref Range Status   Specimen Description   Final     BLOOD LEFT HAND Performed at [facility], [address] [place], [place] [address]    Special Requests   Final    BOTTLES DRAWN AEROBIC AND ANAEROBIC Blood Culture results may not be optimal due to an excessive volume of blood received in culture bottles Performed at [facility], [address] [place], [place] [address]    Culture   Final    NO GROWTH 4 DAYS Performed at [facility], [address], [place], [place] [address]    Report Status PENDING  Incomplete  Urine Culture (for pregnant, neutropenic or urologic patients or patients with an indwelling urinary catheter)     Status: Abnormal   Collection Time: 12/23/23  1:17 PM   Specimen: Urine, Catheterized  Result Value Ref Range Status  Specimen Description   Final    URINE, CATHETERIZED Performed at [facility], [address] [place], [place] [address]    Special Requests   Final    NONE Performed at [facility], [address] [place], [place] [address]    Culture (A)  Final    <10,000 COLONIES/mL INSIGNIFICANT GROWTH Performed at [facility], [address] [place], [place] [address]    Report Status 12/24/2023 FINAL  Final  Resp panel by RT-PCR (RSV, Flu A&B, Covid) Anterior Nasal Swab     Status: None   Collection Time: 12/23/23  7:01 PM   Specimen: Anterior Nasal Swab  Result Value Ref Range Status   SARS Coronavirus 2 by RT PCR NEGATIVE NEGATIVE Final    Comment: (NOTE) SARS-CoV-2 target nucleic acids are NOT DETECTED.  The SARS-CoV-2 RNA is generally detectable in upper respiratory specimens during the acute phase of infection. The lowest concentration of SARS-CoV-2 viral copies this assay can detect is 138 copies/mL. A negative result does not preclude SARS-Cov-2 infection and should not be used as the sole basis for treatment or other patient management decisions. A negative result may occur with  improper specimen  collection/handling, submission of specimen other than nasopharyngeal swab, presence of viral mutation(s) within the areas targeted by this assay, and inadequate number of viral copies(<138 copies/mL). A negative result must be combined with clinical observations, patient history, and epidemiological information. The expected result is Negative.  Fact Sheet for Patients:  BloggerCourse.com  Fact Sheet for Healthcare Providers:  SeriousBroker.it  This test is no t yet approved or cleared by the [place]  FDA and  has been authorized for detection and/or diagnosis of SARS-CoV-2 by FDA under an Emergency Use Authorization (EUA). This EUA will remain  in effect (meaning this test can be used) for the duration of the COVID-19 declaration under Section 564(b)(1) of the Act, 21 U.S.C.section 360bbb-3(b)(1), unless the authorization is terminated  or revoked sooner.       Influenza A by PCR NEGATIVE NEGATIVE Final   Influenza B by PCR NEGATIVE NEGATIVE Final    Comment: (NOTE) The Xpert Xpress SARS-CoV-2/FLU/RSV plus assay is intended as an aid in the diagnosis of influenza from Nasopharyngeal swab specimens and should not be used as a sole basis for treatment. Nasal washings and aspirates are unacceptable for Xpert Xpress SARS-CoV-2/FLU/RSV testing.  Fact Sheet for Patients: BloggerCourse.com  Fact Sheet for Healthcare Providers: SeriousBroker.it  This test is not yet approved or cleared by the [place]  FDA and has been authorized for detection and/or diagnosis of SARS-CoV-2 by FDA under an Emergency Use Authorization (EUA). This EUA will remain in effect (meaning this test can be used) for the duration of the COVID-19 declaration under Section 564(b)(1) of the Act, 21 U.S.C. section 360bbb-3(b)(1), unless the authorization is terminated or revoked.     Resp Syncytial  Virus by PCR NEGATIVE NEGATIVE Final    Comment: (NOTE) Fact Sheet for Patients: BloggerCourse.com  Fact Sheet for Healthcare Providers: SeriousBroker.it  This test is not yet approved or cleared by the [place]  FDA and has been authorized for detection and/or diagnosis of SARS-CoV-2 by FDA under an Emergency Use Authorization (EUA). This EUA will remain in effect (meaning this test can be used) for the duration of the COVID-19 declaration under Section 564(b)(1) of the Act, 21 U.S.C. section 360bbb-3(b)(1), unless the authorization is terminated or revoked.  Performed at [facility], [address] [place], [place] [address]   MRSA Next Gen by PCR, Nasal     Status: None   Collection Time: 12/23/23  8:31 PM   Specimen: Nasal Mucosa; Nasal Swab  Result Value Ref Range Status   MRSA by PCR Next Gen NOT DETECTED NOT DETECTED Final    Comment: (NOTE) The GeneXpert MRSA Assay (FDA approved for NASAL specimens only), is one component of a comprehensive MRSA colonization surveillance program. It is not intended to diagnose MRSA infection nor to guide or monitor treatment for MRSA infections. Test performance is not FDA approved in patients less than 93 years old. Performed at [facility], [address], [place], [place] [address]      Time coordinating discharge: 39 minutes  SIGNED:   Barbee Lew, MD  [facility] 12/27/2023, 11:04 AM

## 2023-12-27 NOTE — NC FL2 (Signed)
 [place]  MEDICAID FL2 LEVEL OF CARE FORM     IDENTIFICATION  Patient Name: Carolyn Norton Birthdate: 08/04/1966 Sex: female Admission Date (Current Location): 12/23/2023  [facility] and [place] Number:  Producer, television/film/video and Address:  [facility],  [address] 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) 05/29/2022   Essential hypertension 02/25/2022   Elevated blood-pressure reading, without diagnosis of hypertension 07/29/2021   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 04/29/2021   Protein-calorie malnutrition, severe 03/23/2021   Controlled type 2 diabetes mellitus with hyperglycemia, with long-term current use of insulin  (HCC) 02/11/2021   NPH (normal pressure hydrocephalus) (HCC)    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    Bipolar 1 disorder, depressed, full remission (HCC) 01/18/2021   Generalized weakness 01/11/2021   Acute metabolic encephalopathy 10/22/2020   Hypotension 10/22/2020   Type 2 diabetes mellitus with hyperlipidemia (HCC) 10/22/2020   Altered mental status    Hyperosmolar hyperglycemic state (HHS) (HCC)    Hyperglycemia    Hyperkalemia    AKI (acute kidney injury) (HCC)    Hypertension    DKA (diabetic ketoacidosis) (HCC) 09/06/2020   COVID-19 09/06/2020   Bipolar 1 disorder (HCC) 09/06/2020   Breast mass status post excision 09/06/2020    Smoker 09/06/2020   Metabolic acidosis due to ingestion of drugs or chemicals 09/06/2020    Orientation RESPIRATION BLADDER Height & Weight     Self  Normal Incontinent Weight: 182 lb 12.2 oz (82.9 kg) Height:  5\' 4"  (162.6 cm)  BEHAVIORAL SYMPTOMS/MOOD NEUROLOGICAL BOWEL NUTRITION STATUS        Diet (Carb Modified Fluid consistency: Thin)  AMBULATORY STATUS COMMUNICATION OF NEEDS Skin   Total Care Verbally Other (Comment) (Non-pressure wound Buttocks Bilateral;Medial Excoriation)                       Personal Care Assistance Level of Assistance  Bathing, Feeding, Dressing Bathing Assistance: Limited assistance Feeding assistance: Limited assistance Dressing Assistance: Limited assistance     Functional Limitations Info  Sight, Hearing, Speech Sight Info: Impaired (eye glasses) Hearing Info: Adequate Speech Info: Adequate    SPECIAL CARE FACTORS FREQUENCY                       Contractures Contractures Info: Not present    Additional Factors Info  Code Status, Allergies, Psychotropic Code Status Info: FULL Allergies Info: Erythromycin Psychotropic Info: divalproex  (DEPAKOTE ) DR tablet 250 mg and lamoTRIgine  (LAMICTAL ) tablet 50 mg         Current Medications (12/27/2023):  This is the current hospital active medication list Current Facility-Administered Medications  Medication Dose Route Frequency Provider Last Rate Last Admin   acetaminophen  (TYLENOL ) tablet 650 mg  650 mg Oral Q4H PRN Olalere, Adewale A, MD       atorvastatin  (LIPITOR) tablet 20 mg  20 mg Oral Daily Olalere, Adewale A, MD  20 mg at 12/27/23 0933   cefTRIAXone  (ROCEPHIN ) 2 g in sodium chloride  0.9 % 100 mL IVPB  2 g Intravenous Q24H Autry, Lauren E, PA-C 200 mL/hr at 12/26/23 1738 2 g at 12/26/23 1738   Chlorhexidine  Gluconate Cloth 2 % PADS 6 each  6 each Topical Daily Olalere, Adewale A, MD   6 each at 12/26/23 0921   dextrose  50 % solution 0-50 mL  0-50 mL Intravenous PRN Horton,  Kristie M, DO       divalproex  (DEPAKOTE ) DR tablet 250 mg  250 mg Oral Q8H Olalere, Adewale A, MD   250 mg at 12/27/23 0551   docusate sodium  (COLACE) capsule 100 mg  100 mg Oral BID PRN Olalere, Adewale A, MD       feeding supplement (ENSURE ENLIVE / ENSURE PLUS) liquid 237 mL  237 mL Oral BID BM Amponsah, Prosper M, MD   237 mL at 12/27/23 1610   heparin  injection 5,000 Units  5,000 Units Subcutaneous Q8H Autry, Lauren E, PA-C   5,000 Units at 12/27/23 9604   insulin  aspart (novoLOG ) injection 2-6 Units  2-6 Units Subcutaneous Q4H Autry, Lauren E, PA-C   2 Units at 12/27/23 5409   insulin  glargine-yfgn (SEMGLEE ) injection 4 Units  4 Units Subcutaneous BID Olalere, Adewale A, MD   4 Units at 12/27/23 0934   lamoTRIgine  (LAMICTAL ) tablet 50 mg  50 mg Oral BID Olalere, Adewale A, MD   50 mg at 12/27/23 0933   ondansetron  (ZOFRAN ) injection 4 mg  4 mg Intravenous Q6H PRN Autry, Lauren E, PA-C       Oral care mouth rinse  15 mL Mouth Rinse PRN Olalere, Adewale A, MD       pantoprazole  (PROTONIX ) EC tablet 40 mg  40 mg Oral QHS Olalere, Adewale A, MD   40 mg at 12/26/23 2154   polyethylene glycol (MIRALAX  / GLYCOLAX ) packet 17 g  17 g Oral Daily PRN Margaretann Sharper, MD         Discharge Medications: Please see discharge summary for a list of discharge medications.  Relevant Imaging Results:  Relevant Lab Results:   Additional Information SS#: [id number]  Melinda Sprawls [place]and Tomich, LCSW

## 2023-12-27 NOTE — TOC Progression Note (Signed)
 Transition of Care [facility]) - Progression Note    Patient Details  Name: Carolyn Norton MRN: [id number] Date of Birth: 1966/03/29  Transition of Care [facility]) CM/SW Contact  Tessie Fila, RN Phone Number: 12/27/2023, 3:16 PM  Clinical Narrative:    PTAR called at 3:08pm. Discharge packet left at RN station and RN notified to call report to the 2nd floor nursing station at [facility] at [phone].   Expected Discharge Plan: Long Term Nursing Home Barriers to Discharge: Barriers Resolved  Expected Discharge Plan and Services In-house Referral: NA Discharge Planning Services: CM Consult Post Acute Care Choice: Long Term Acute Care (LTAC) Living arrangements for the past 2 months: Skilled Nursing Facility Expected Discharge Date: 12/27/23                 DME Agency: NA                   Social Determinants of Health (SDOH) Interventions SDOH Screenings   Food Insecurity: Patient Unable To Answer (12/24/2023)  Housing: Patient Unable To Answer (12/24/2023)  Transportation Needs: Patient Unable To Answer (12/24/2023)  Utilities: Patient Unable To Answer (12/24/2023)  Depression (PHQ2-9): Low Risk  (07/24/2020)  Tobacco Use: High Risk (09/22/2023)    Readmission Risk Interventions    12/27/2023   10:26 AM  Readmission Risk Prevention Plan  Transportation Screening Complete  PCP or Specialist Appt within 5-7 Days Complete  Home Care Screening Complete  Medication Review (RN CM) Complete

## 2023-12-27 NOTE — Plan of Care (Signed)

## 2023-12-27 NOTE — Progress Notes (Addendum)
 Report given to Clinical biochemist at [facility]. 1545 H. PTAR at bedside for transport, patient has  due last dose ceftriaxone  at 1700, as per [facility], keep patient's PIV and send the due ceftriaxone  with her, last due dose to be given at the facility.

## 2023-12-27 NOTE — Inpatient Diabetes Management (Signed)
 Inpatient Diabetes Program Recommendations  AACE/ADA: New Consensus Statement on Inpatient Glycemic Control (2015)  Target Ranges:  Prepandial:   less than 140 mg/dL      Peak postprandial:   less than 180 mg/dL (1-2 hours)      Critically ill patients:  140 - 180 mg/dL   Lab Results  Component Value Date   GLUCAP 121 (H) 12/27/2023   HGBA1C 11.3 (H) 12/24/2023    Review of Glycemic Control  Latest Reference Range & Units 12/26/23 08:00 12/26/23 12:12 12/26/23 17:32 12/26/23 20:31 12/26/23 23:43 12/27/23 03:46 12/27/23 07:45  Glucose-Capillary 70 - 99 mg/dL 161 (H) 096 (H) 045 (H) 179 (H) 81 78 121 (H)  (H): Data is abnormally high  Diabetes history: DM2 Outpatient Diabetes medications: Lantus  28 units QAM, 10 units at bedtime, Novolog  2-4 units TID, Farixga 5 mg every day Current orders for Inpatient glycemic control: Semglee  4 units BID, Novolog  2-6 units Q4H  Inpatient Diabetes Program Recommendations:    Please consider:  Novolog  0-9 units TID and 0-5 units at bedtime  Will continue to follow while inpatient.  Thank you, Hays Lipschutz, MSN, CDCES Diabetes Coordinator Inpatient Diabetes Program [phone] (team pager from 8a-5p)

## 2023-12-27 NOTE — TOC Transition Note (Signed)
 Transition of Care [facility]) - Discharge Note   Patient Details  Name: Carolyn Norton MRN: [id number] Date of Birth: February 28, 1966  Transition of Care [facility]) CM/SW Contact:  Jonni Nettle, LCSW Phone Number: 12/27/2023, 11:49 AM   Clinical Narrative:    CSW spoke with pt's father, Ashrita Chrismer, to confirm discharge plans of returning to [facility] LTC SNF. Spoke with facility who are able to accept pt after 4pm today. RN to call to report to [phone]. D/C packet placed at RN station. D/C summary faxed to [facility]. CSW will call PTAR for transportation closer to 4pm.    Final next level of care: Long Term Nursing Home Barriers to Discharge: Barriers Resolved   Patient Goals and CMS Choice Patient states their goals for this hospitalization and ongoing recovery are:: To return to [facility].gov Compare Post Acute Care list provided to:: Patient Represenative (must comment) ([place](POA),[name]- pt father) Choice offered to / list presented to : [facility] POA / Guardian [place] ownership interest in [facility].provided to:: [facility] POA / Guardian    Discharge Placement   Existing PASRR number confirmed : 12/27/23          Patient chooses bed at: Other - please specify in the comment section below: [facility]) Patient to be transferred to facility by: PTAR Name of family member notified: pt's father, Jamelyn Bovard Patient and family notified of of transfer: 12/27/23  Discharge Plan and Services Additional resources added to the After Visit Summary for   In-house Referral: NA Discharge Planning Services: CM Consult Post Acute Care Choice: Long Term Acute Care (LTAC)            DME Agency: NA  Social Drivers of Health (SDOH) Interventions SDOH Screenings   Food Insecurity: Patient Unable To Answer (12/24/2023)  Housing: Patient Unable To Answer (12/24/2023)  Transportation Needs: Patient Unable To Answer (12/24/2023)  Utilities:  Patient Unable To Answer (12/24/2023)  Depression (PHQ2-9): Low Risk  (07/24/2020)  Tobacco Use: High Risk (09/22/2023)    Readmission Risk Interventions    12/27/2023   10:26 AM  Readmission Risk Prevention Plan  Transportation Screening Complete  PCP or Specialist Appt within 5-7 Days Complete  Home Care Screening Complete  Medication Review (RN CM) Complete   [place], LCSW 12/27/2023 11:56 AM

## 2023-12-28 LAB — CULTURE, BLOOD (ROUTINE X 2)
Culture: NO GROWTH
Culture: NO GROWTH
Special Requests: ADEQUATE

## 2023-12-30 ENCOUNTER — Telehealth: Payer: Self-pay

## 2023-12-30 NOTE — Telephone Encounter (Signed)
 I called pt's father to do meaningful use update for upcoming follow up appointment with Dr.Newton on Monday 4/28. He states Carolyn Norton is at [facility] rehabilitation center([phone]). Per his request I called the facility and left a voicemail for them to call back so we could let them know about appointment.

## 2023-12-31 NOTE — Telephone Encounter (Signed)
 Unable to reach [facility] facility regarding pt's upcoming appointment with Dr.Newton on Monday 4/28. I LVM asking the charge nurse to return my call.   I also, called Ms. Colson's cell number in chart and LVM for her to call office as well.

## 2024-01-03 ENCOUNTER — Inpatient Hospital Stay: Attending: Psychiatry | Admitting: Psychiatry

## 2024-01-03 ENCOUNTER — Encounter: Payer: Self-pay | Admitting: Oncology

## 2024-01-03 VITALS — BP 127/77 | HR 96 | Temp 97.5°F | Resp 18 | Ht 64.0 in | Wt 153.8 lb

## 2024-01-03 DIAGNOSIS — Z170 Estrogen receptor positive status [ER+]: Secondary | ICD-10-CM | POA: Insufficient documentation

## 2024-01-03 DIAGNOSIS — G309 Alzheimer's disease, unspecified: Secondary | ICD-10-CM | POA: Insufficient documentation

## 2024-01-03 DIAGNOSIS — G912 (Idiopathic) normal pressure hydrocephalus: Secondary | ICD-10-CM | POA: Diagnosis not present

## 2024-01-03 DIAGNOSIS — I129 Hypertensive chronic kidney disease with stage 1 through stage 4 chronic kidney disease, or unspecified chronic kidney disease: Secondary | ICD-10-CM | POA: Diagnosis not present

## 2024-01-03 DIAGNOSIS — F17210 Nicotine dependence, cigarettes, uncomplicated: Secondary | ICD-10-CM | POA: Insufficient documentation

## 2024-01-03 DIAGNOSIS — C541 Malignant neoplasm of endometrium: Secondary | ICD-10-CM | POA: Diagnosis present

## 2024-01-03 DIAGNOSIS — Z79890 Hormone replacement therapy (postmenopausal): Secondary | ICD-10-CM | POA: Diagnosis not present

## 2024-01-03 DIAGNOSIS — Z982 Presence of cerebrospinal fluid drainage device: Secondary | ICD-10-CM | POA: Diagnosis not present

## 2024-01-03 DIAGNOSIS — E1165 Type 2 diabetes mellitus with hyperglycemia: Secondary | ICD-10-CM | POA: Diagnosis not present

## 2024-01-03 DIAGNOSIS — Z599 Problem related to housing and economic circumstances, unspecified: Secondary | ICD-10-CM | POA: Diagnosis not present

## 2024-01-03 DIAGNOSIS — E1169 Type 2 diabetes mellitus with other specified complication: Secondary | ICD-10-CM

## 2024-01-03 DIAGNOSIS — R4189 Other symptoms and signs involving cognitive functions and awareness: Secondary | ICD-10-CM

## 2024-01-03 DIAGNOSIS — I10 Essential (primary) hypertension: Secondary | ICD-10-CM

## 2024-01-03 NOTE — Telephone Encounter (Signed)
 I spoke to the charge nurse, Tarry Farmer, today at [facility], She states they are aware of Ms.Loppnow's appointment today and transportation has been scheduled. Tarry Farmer states Ms.Morgenthaler is refusing to come to the appointment today but the MD will visit her and try to talk her into coming. Facility will call us  if pt will not be coming.

## 2024-01-03 NOTE — Patient Instructions (Signed)
 It was a pleasure to see you in clinic today. - Recommend referral to radiation oncology to consider treatment   Thank you very much for allowing me to provide care for you today.  I appreciate your confidence in choosing our Gynecologic Oncology team at [facility].  If you have any questions about your visit today please call our office or send us  a MyChart message and we will get back to you as soon as possible.

## 2024-01-03 NOTE — Progress Notes (Unsigned)
 Gynecologic Oncology Return Clinic Visit  Date of Service: 01/03/2024 Referring Provider: Jeannett Million, MD   Assessment & Plan: Carolyn Norton is a 58 y.o. 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  Medically complicated by diabetes poorly controlled, hypertension, hyperlipidemia, chronic kidney disease, and normal pressure hydrocephalus with prior mental status changes and VP shunt in place.  Following MRI in January, although deep myometrial invasion noted, poor glucose control with A1c of 12.3.  At that time we decided to continue on oral progesterone until improvement in glucose control.  Presents today for follow-up.  Patient was recently discharged from the hospital for treatment of DKA.  Her cognitive status has also declined since her last visit with me.  Patient is awake but limited participation in exam.  Not oriented to year or situation.  Given her ongoing significant medical comorbidities that make her a poor candidate for surgery, and her deep myometrial invasion which makes her a suboptimal candidate for medication management alone, recommend consideration of radiation treatment for definitive treatment of endometrial cancer.  As she currently is at a skilled nursing facility, this may be logistically challenging but worth trying to explore.  Did discuss with patient and her father that if we are unable to coordinate radiation, we can continue her hormonal medication but this may not be sufficient to adequately hold off of the cancer indefinitely.  However we could consider continuing this in a somewhat palliative nature knowing that at some point the cancer may spread.  [name] understanding of this but open to exploring radiation first as an option for potential definitive treatment.  Referral to radiation oncology.  Follow-up in our clinic pending consultation with rad onc and potential treatment. Derrel Flies, MD Gynecologic Oncology   Medical Decision Making I personally spent  TOTAL 45 minutes face-to-face and non-face-to-face in the care of this patient, which includes all pre, intra, and post visit time on the date of service.   ----------------------- Reason for Visit: Follow-up  Treatment History: Oncology History  Endometrial cancer (HCC)  08/17/2023 Pathology Results   Endometrium, biopsy: Endometrioid adenocarcinoma, FIGO 1-2. The biopsy shows a glandular proliferation consistent with the diagnosis of endometrioid adenocarcinoma.  The carcinoma is composed of predominantly well-formed glands with focal increase solid component.  Therefore the FIGO grade is rendered 1-2.  Definitive FIGO grade should be evaluated at resection specimen.  Immunohistochemical stains were performed.  P53 is wild-type pattern of staining.  ER is positive in the tumor cells.  Napsin A is negative.  Controls worked appropriately.   08/17/2023 Initial Diagnosis   Endometrial cancer (HCC)   09/20/2023 Imaging   MRI Pelvis: IMPRESSION: 1. Endometrial lesion involves the right cornua region of the uterus. There is evidence of myometrial invasion. This involves greater than 50% of myometrial thickness and I would estimate in some areas it comes within 2 mm of the serosa. 2. No pelvic lymphadenopathy or findings for metastatic disease. 3. 13 mm nonenhancing cyst in the right adnexa is likely a benign paraovarian cyst.     Interval History: Patient presents today with a caretaker from her skilled nursing facility and her father.  She presents in a wheelchair.  Patient was recently discharged from the hospital 12/27/2023 in the setting of an episode of DKA.  Her most recent A1c is still elevated 11.3.  Her father is unsure of her sugar control currently at the nursing home.  They otherwise deny new  bleeding, pelvic pain, issues with her bowel or bladder.  Her cognitive status has been slightly worse  since her hospitalizations.  Per review of medication list from facility, currently on Farxiga, NovoLog  sliding scale, and glargine 8 units twice daily for her diabetes.   Past Medical/Surgical History: Past Medical History:  Diagnosis Date   Alzheimer disease (HCC)    from facility diagnosis, 12/10/22- orientation has improved since she had shunt placed.   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   COVID    History of   Depression    Diabetes mellitus without complication (HCC)    Type 2 (type 1 per patient and aunt)   Dyspnea    Hyperlipidemia    Hypertension    Normal pressure hydrocephalus (HCC)    Seasonal allergies     Past Surgical History:  Procedure Laterality Date   BREAST EXCISIONAL BIOPSY Right    BREAST SURGERY     right breast   CATARACT EXTRACTION W/PHACO Left 02/12/2023   Procedure: CATARACT EXTRACTION PHACO AND INTRAOCULAR LENS PLACEMENT (IOC);  Surgeon: Tarri Farm, MD;  Location: [facility]S;  Service: Ophthalmology;  Laterality: Left;  CDE: 22.05   CATARACT EXTRACTION W/PHACO Right 02/26/2023   Procedure: CATARACT EXTRACTION PHACO AND INTRAOCULAR LENS PLACEMENT (IOC);  Surgeon: Tarri Farm, MD;  Location: [facility]S;  Service: Ophthalmology;  Laterality: Right;  CDE: 24.40   TOOTH EXTRACTION N/A 12/11/2022   Procedure: DENTAL RESTORATION/EXTRACTIONS;  Surgeon: Ascencion Lava, DMD;  Location: [facility] OR;  Service: Oral Surgery;  Laterality: N/A;   VENTRICULOPERITONEAL SHUNT N/A 05/29/2022   Procedure: Ventriculoperitoneal shunt placement;  Surgeon: Gearl Keens, MD;  Location: [facility] OR;  Service: Neurosurgery;  Laterality: N/A;    Family History  Problem Relation Age of Onset   Hypertension Mother    Kidney cancer Mother    Prostate cancer Father    Leukemia Brother    Breast cancer Paternal Grandmother    Endometrial cancer Neg Hx    Colon cancer Neg Hx    Ovarian cancer Neg Hx     Social  History   Socioeconomic History   Marital status: Widowed    Spouse name: Not on file   Number of children: 0   Years of education: Not on file   Highest education level: High school graduate  Occupational History   Not on file  Tobacco Use   Smoking status: Every Day    Current packs/day: 0.50    Average packs/day: 0.5 packs/day for 1 year (0.5 ttl pk-yrs)    Types: Cigarettes    Start date: 01/06/2023    Last attempt to quit: 04/07/2021   Smokeless tobacco: Never  Vaping Use   Vaping status: Never Used  Substance and Sexual Activity   Alcohol use: No   Drug use: Not Currently    Types: Cocaine   Sexual activity: Not Currently    Birth control/protection: None, Post-menopausal  Other Topics Concern   Not on file  Social History Narrative   Not on file   Social Drivers of Health   Financial Resource Strain: Not on file  Food Insecurity: Patient Unable To Answer (12/24/2023)   Hunger Vital Sign    Worried About Running Out of Food in the Last Year: Patient unable to answer    Ran Out of Food in the Last Year: Patient unable to answer  Transportation Needs: Patient Unable To Answer (12/24/2023)   PRAPARE - Administrator, Civil Service (Medical): Patient unable to answer    Lack of Transportation (Non-Medical): Patient unable to answer  Physical Activity: Not on file  Stress: Not on file  Social Connections: Not on file    Current Medications:  Current Outpatient Medications:    albuterol  (VENTOLIN  HFA) 108 (90 Base) MCG/ACT inhaler, Inhale 2 puffs into the lungs See admin instructions. 2 puffs every 4-6 hours as needed for wheezing and SOB, Disp: , Rfl:    atorvastatin  (LIPITOR) 20 MG tablet, Take 1 tablet (20 mg total) by mouth daily., Disp: 90 tablet, Rfl: 1   Cholecalciferol  25 MCG (1000 UT) tablet, Take 1,000 Units by mouth daily., Disp: , Rfl:    dapagliflozin propanediol (FARXIGA) 5 MG TABS tablet, Take 5 mg by mouth in the morning and at bedtime., Disp:  , Rfl:    divalproex  (DEPAKOTE ) 250 MG DR tablet, Take 250 mg by mouth 3 (three) times daily., Disp: , Rfl:    docusate sodium  (COLACE) 100 MG capsule, Take 1 capsule (100 mg total) by mouth 2 (two) times daily as needed for mild constipation., Disp: 10 capsule, Rfl: 0   guaiFENesin (ROBITUSSIN) 100 MG/5ML liquid, Take 5 mLs by mouth every 4 (four) hours as needed for cough or to loosen phlegm., Disp: , Rfl:    insulin  aspart (NOVOLOG ) 100 UNIT/ML injection, Inject 0-20 Units into the skin 3 (three) times daily with meals. CBG 70 - 120: 0 units CBG 121 - 150: 0 units CBG 151 - 200: 0 units CBG 201 - 250: 2 units CBG 251 - 300: 3 units CBG 301 - 350: 4 units CBG 351 - 400: 5 units CBG > 400: call MD and obtain STAT lab verification (Patient taking differently: Inject 2-4 Units into the skin 3 (three) times daily with meals. BS 201-250 inject 2 units  BS 251-300 inject 3 units  BS 301-350 inject 4 units  BS 315-400 inject 4 units), Disp: , Rfl:    insulin  glargine-yfgn (SEMGLEE ) 100 UNIT/ML injection, Inject 0.04 mLs (4 Units total) into the skin 2 (two) times daily., Disp: 10 mL, Rfl: 11   lamoTRIgine  (LAMICTAL ) 25 MG tablet, Take 50 mg by mouth 2 (two) times daily., Disp: , Rfl:    megestrol  (MEGACE ) 20 MG tablet, Take 40 mg by mouth daily., Disp: , Rfl:    Multiple Vitamins-Minerals (MULTIVITAMIN WITH MINERALS) tablet, Take 1 tablet by mouth daily., Disp: , Rfl:    ondansetron  (ZOFRAN ) 4 MG tablet, Take 4 mg by mouth every 6 (six) hours as needed for nausea or vomiting., Disp: , Rfl:    pantoprazole  (PROTONIX ) 40 MG tablet, Take 40 mg by mouth daily., Disp: , Rfl:    polyethylene glycol (MIRALAX  / GLYCOLAX ) 17 g packet, Take 17 g by mouth daily. (Patient taking differently: Take 17 g by mouth daily as needed for moderate constipation.), Disp: 14 each, Rfl: 0  Review of Symptoms: Complete 10-system review is positive for: confusion  Physical Exam: BP 127/77 (BP Location: Left Arm, Patient Position:  Sitting)   Pulse 96   Temp (!) 97.5 F (36.4 C) (Tympanic)   Resp 18   Ht 5\' 4"  (1.626 m)   Wt 153 lb 12.8 oz (69.8 kg)   LMP 01/26/2017 (Approximate)   SpO2 100%   BMI 26.40 kg/m  General: No acute distress, patient with limited participation in visit today.  Oriented to person.  Unclear  if oriented to location (stated she was at [facility]).  Not oriented to year, or situation HEENT: Normocephalic, atraumatic. Neck symmetric without masses.  Chest: Normal work of breathing. Clear to auscultation bilaterally.   Cardiovascular: Regular rate and rhythm, no murmurs. Abdomen: Soft, nontender.  Normoactive bowel sounds.   Extremities: Sitting in wheelchair Lymphatics: No cervical, supraclavicular, or inguinal adenopathy. GU: Normal appearing external genitalia without erythema, excoriation, or lesions.  Speculum exam reveals small amount of pink-tinged mucus discharge in vaginal vault.  Normal-appearing cervix and vaginal mucosa.  Bimanual exam reveals normal cervix, mobile uterus. Exam chaperoned by Kimberly [place], CMA   Laboratory & Radiologic Studies: none

## 2024-01-03 NOTE — Progress Notes (Signed)
 Referral to radiation oncology placed per Dr. Daisey Dryer.

## 2024-01-04 ENCOUNTER — Encounter: Payer: Self-pay | Admitting: Psychiatry

## 2024-01-04 ENCOUNTER — Telehealth: Payer: Self-pay

## 2024-01-04 DIAGNOSIS — C541 Malignant neoplasm of endometrium: Secondary | ICD-10-CM | POA: Insufficient documentation

## 2024-01-04 NOTE — Telephone Encounter (Signed)
 I called [facility]. I spoke to [place], Press photographer. She states they get clients medications through [facility] home delivery. No need to get refill from [facility].   I will notify [facility] no refill needed at this time.

## 2024-01-04 NOTE — Telephone Encounter (Signed)
 Refill request received from [facility] for Megestrol  40mg .   Pt was seen by Dr.Newton on Monday 4/28. Please advise (refill form for your review)

## 2024-01-12 NOTE — Progress Notes (Signed)
 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 {:18581} Pacemaker/ICD? {:18581} Possible current pregnancy? {:18581} Is the patient on methotrexate? {:18581}  Current Complaints / other details:  ***

## 2024-01-13 ENCOUNTER — Encounter: Payer: Self-pay | Admitting: Radiation Oncology

## 2024-01-13 ENCOUNTER — Telehealth: Payer: Self-pay | Admitting: Radiation Oncology

## 2024-01-13 ENCOUNTER — Encounter (HOSPITAL_COMMUNITY): Payer: Self-pay | Admitting: *Deleted

## 2024-01-13 ENCOUNTER — Other Ambulatory Visit: Payer: Self-pay

## 2024-01-13 ENCOUNTER — Inpatient Hospital Stay (HOSPITAL_COMMUNITY)
Admission: EM | Admit: 2024-01-13 | Discharge: 2024-01-18 | DRG: 637 | Disposition: A | Discharge status: Skilled Nursing Facility | Source: Skilled Nursing Facility | Attending: Internal Medicine | Admitting: Internal Medicine

## 2024-01-13 ENCOUNTER — Emergency Department (HOSPITAL_COMMUNITY)

## 2024-01-13 ENCOUNTER — Ambulatory Visit
Admission: RE | Admit: 2024-01-13 | Discharge: 2024-01-13 | Disposition: A | Discharge status: Home / Self Care | Source: Ambulatory Visit | Attending: Radiation Oncology | Admitting: Radiation Oncology

## 2024-01-13 VITALS — BP 103/78 | HR 114 | Temp 97.3°F | Resp 20 | Ht 64.0 in

## 2024-01-13 DIAGNOSIS — N179 Acute kidney failure, unspecified: Secondary | ICD-10-CM | POA: Diagnosis present

## 2024-01-13 DIAGNOSIS — J45909 Unspecified asthma, uncomplicated: Secondary | ICD-10-CM | POA: Diagnosis present

## 2024-01-13 DIAGNOSIS — N181 Chronic kidney disease, stage 1: Secondary | ICD-10-CM | POA: Diagnosis present

## 2024-01-13 DIAGNOSIS — C541 Malignant neoplasm of endometrium: Secondary | ICD-10-CM

## 2024-01-13 DIAGNOSIS — Z982 Presence of cerebrospinal fluid drainage device: Secondary | ICD-10-CM

## 2024-01-13 DIAGNOSIS — Z881 Allergy status to other antibiotic agents status: Secondary | ICD-10-CM

## 2024-01-13 DIAGNOSIS — Z87891 Personal history of nicotine dependence: Secondary | ICD-10-CM

## 2024-01-13 DIAGNOSIS — Z8051 Family history of malignant neoplasm of kidney: Secondary | ICD-10-CM

## 2024-01-13 DIAGNOSIS — E1110 Type 2 diabetes mellitus with ketoacidosis without coma: Principal | ICD-10-CM | POA: Diagnosis present

## 2024-01-13 DIAGNOSIS — F0283 Dementia in other diseases classified elsewhere, unspecified severity, with mood disturbance: Secondary | ICD-10-CM | POA: Diagnosis present

## 2024-01-13 DIAGNOSIS — R4182 Altered mental status, unspecified: Secondary | ICD-10-CM | POA: Diagnosis not present

## 2024-01-13 DIAGNOSIS — Z806 Family history of leukemia: Secondary | ICD-10-CM

## 2024-01-13 DIAGNOSIS — Z8616 Personal history of COVID-19: Secondary | ICD-10-CM

## 2024-01-13 DIAGNOSIS — E1122 Type 2 diabetes mellitus with diabetic chronic kidney disease: Secondary | ICD-10-CM | POA: Diagnosis present

## 2024-01-13 DIAGNOSIS — E785 Hyperlipidemia, unspecified: Secondary | ICD-10-CM | POA: Diagnosis present

## 2024-01-13 DIAGNOSIS — Z794 Long term (current) use of insulin: Secondary | ICD-10-CM

## 2024-01-13 DIAGNOSIS — Z7984 Long term (current) use of oral hypoglycemic drugs: Secondary | ICD-10-CM

## 2024-01-13 DIAGNOSIS — Z8249 Family history of ischemic heart disease and other diseases of the circulatory system: Secondary | ICD-10-CM

## 2024-01-13 DIAGNOSIS — G309 Alzheimer's disease, unspecified: Secondary | ICD-10-CM | POA: Diagnosis present

## 2024-01-13 DIAGNOSIS — Z803 Family history of malignant neoplasm of breast: Secondary | ICD-10-CM

## 2024-01-13 DIAGNOSIS — Z602 Problems related to living alone: Secondary | ICD-10-CM | POA: Diagnosis present

## 2024-01-13 DIAGNOSIS — R6510 Systemic inflammatory response syndrome (SIRS) of non-infectious origin without acute organ dysfunction: Secondary | ICD-10-CM | POA: Diagnosis present

## 2024-01-13 DIAGNOSIS — Z8042 Family history of malignant neoplasm of prostate: Secondary | ICD-10-CM

## 2024-01-13 DIAGNOSIS — Z91148 Patient's other noncompliance with medication regimen for other reason: Secondary | ICD-10-CM

## 2024-01-13 DIAGNOSIS — F319 Bipolar disorder, unspecified: Secondary | ICD-10-CM | POA: Diagnosis present

## 2024-01-13 DIAGNOSIS — I959 Hypotension, unspecified: Secondary | ICD-10-CM | POA: Diagnosis present

## 2024-01-13 DIAGNOSIS — Z8542 Personal history of malignant neoplasm of other parts of uterus: Secondary | ICD-10-CM

## 2024-01-13 DIAGNOSIS — G9341 Metabolic encephalopathy: Secondary | ICD-10-CM | POA: Diagnosis present

## 2024-01-13 DIAGNOSIS — E860 Dehydration: Secondary | ICD-10-CM | POA: Diagnosis present

## 2024-01-13 DIAGNOSIS — G9340 Encephalopathy, unspecified: Secondary | ICD-10-CM | POA: Insufficient documentation

## 2024-01-13 DIAGNOSIS — Z79899 Other long term (current) drug therapy: Secondary | ICD-10-CM

## 2024-01-13 DIAGNOSIS — I129 Hypertensive chronic kidney disease with stage 1 through stage 4 chronic kidney disease, or unspecified chronic kidney disease: Secondary | ICD-10-CM | POA: Diagnosis present

## 2024-01-13 DIAGNOSIS — J9601 Acute respiratory failure with hypoxia: Secondary | ICD-10-CM | POA: Insufficient documentation

## 2024-01-13 DIAGNOSIS — G912 (Idiopathic) normal pressure hydrocephalus: Secondary | ICD-10-CM | POA: Diagnosis present

## 2024-01-13 DIAGNOSIS — D751 Secondary polycythemia: Secondary | ICD-10-CM | POA: Diagnosis present

## 2024-01-13 LAB — I-STAT CG4 LACTIC ACID, ED: Lactic Acid, Venous: 5.4 mmol/L (ref 0.5–1.9)

## 2024-01-13 LAB — CBG MONITORING, ED: Glucose-Capillary: >600 mg/dL (ref 70–99)

## 2024-01-13 LAB — BETA-HYDROXYBUTYRIC ACID: Beta-Hydroxybutyric Acid: >8 mmol/L — ABNORMAL HIGH (ref 0.05–0.27)

## 2024-01-13 MED ORDER — SODIUM CHLORIDE 0.9% FLUSH: 10.0000 mL | INTRAVENOUS | Status: DC | PRN

## 2024-01-13 MED ORDER — DEXTROSE 50 % IV SOLN
0.0000 mL | INTRAVENOUS | Status: DC | PRN
Start: 2024-01-13 — End: 2024-01-18
  Filled 2024-01-13: qty 50

## 2024-01-13 MED ORDER — LACTATED RINGERS IV SOLN: INTRAVENOUS | Status: AC

## 2024-01-13 MED ORDER — INSULIN REGULAR(HUMAN) IN NACL 100-0.9 UT/100ML-% IV SOLN
INTRAVENOUS | Status: DC
  Administered 2024-01-14: 9 [IU]/h via INTRAVENOUS
  Filled 2024-01-13: qty 100

## 2024-01-13 MED ORDER — DEXTROSE IN LACTATED RINGERS 5 % IV SOLN: INTRAVENOUS | Status: AC

## 2024-01-13 MED ORDER — LACTATED RINGERS IV BOLUS
20.0000 mL/kg | Freq: Once | INTRAVENOUS | Status: AC
  Administered 2024-01-14: 1396 mL via INTRAVENOUS

## 2024-01-13 MED ORDER — LACTATED RINGERS IV BOLUS
1000.0000 mL | Freq: Once | INTRAVENOUS | Status: AC
  Administered 2024-01-14: 1000 mL via INTRAVENOUS

## 2024-01-13 NOTE — ED Triage Notes (Signed)
 Pt arrived with EMS from [place] [facility] for hyperglycemia and acute mental status change ~36mins prior to EMS arrival. Pt Non verbal on arrival, will track with eyes. CBG reading HI. E, pulse MS started 22g IV to Left hand with LR infusing.,  E, pulse MS VS 122/76, pulse 140, rr 140

## 2024-01-13 NOTE — ED Provider Notes (Signed)
 [facility] Emergency Department Provider Note MRN:  [id number]  Arrival date & time: 01/14/24     Chief Complaint   Hyperglycemia History of Present Illness   Carolyn Norton is a 58 y.o. year-old female presents to the ED with chief complaint of hyperglycemia.  Brought in by EMS from a nursing facility after patient was found to be altered and had elevated blood sugar.  She was recently admitted a couple of weeks ago for the same.  She is unable to contribute to her history.  EMS reports that the nursing home gave 5 units of insulin .  CBG at nursing home was greater than 600.  EMS says that there was was 567.  She was 83% on room air with kussmaul  breathing.  EMS placed patient on 15 L nonrebreather mask which brought O2 up to 100%.  Hx provided by EMS.   Review of Systems  Pertinent positive and negative review of systems noted in HPI.    Physical Exam   Vitals:   01/14/24 0015 01/14/24 0030  BP: 114/68 (!) 107/94  Pulse:  (!) 144  Resp: (!) 22 (!) 23  Temp:    SpO2:  (!) 89%    CONSTITUTIONAL:  ill-appearing, respiratory distress NEURO:  Confused, GCS 11 (E4V1M6) EYES:  eyes equal and reactive ENT/NECK:  Supple, no stridor  CARDIO:  tachycardic, regular rhythm, appears well-perfused  PULM: Respiratory distress with Kussmaul breathing,  GI/GU:  non-distended,  MSK/SPINE:  No gross deformities, no edema, moves all extremities  SKIN:  no rash, atraumatic   *Additional and/or pertinent findings included in MDM below  Diagnostic and Interventional Summary    EKG Interpretation Date/Time:    Ventricular Rate:    PR Interval:    QRS Duration:    QT Interval:    QTC Calculation:   R Axis:      Text Interpretation:         Labs Reviewed  BETA-HYDROXYBUTYRIC ACID - Abnormal; Notable for the following components:      Result Value   Beta-Hydroxybutyric Acid >8.00 (*)    All other components within normal limits  CBC WITH  DIFFERENTIAL/PLATELET - Abnormal; Notable for the following components:   WBC 16.3 (*)    HCT 48.8 (*)    MCHC 29.9 (*)    RDW 17.2 (*)    Neutro Abs 13.1 (*)    Monocytes Absolute 1.9 (*)    Abs Immature Granulocytes 0.16 (*)    All other components within normal limits  BASIC METABOLIC PANEL WITH GFR - Abnormal; Notable for the following components:   Chloride 96 (*)    CO2 <7 (*)    Glucose, Bld 567 (*)    BUN 30 (*)    Creatinine, Ser 3.37 (*)    GFR, Estimated 15 (*)    All other components within normal limits  CBG MONITORING, ED - Abnormal; Notable for the following components:   Glucose-Capillary >600 (*)    All other components within normal limits  CBG MONITORING, ED - Abnormal; Notable for the following components:   Glucose-Capillary 590 (*)    All other components within normal limits  I-STAT CG4 LACTIC ACID, ED - Abnormal; Notable for the following components:   Lactic Acid, Venous 5.4 (*)    All other components within normal limits  I-STAT CHEM 8, ED - Abnormal; Notable for the following components:   BUN 29 (*)    Creatinine, Ser 2.40 (*)  Glucose, Bld 583 (*)    Calcium , Ion 1.14 (*)    TCO2 8 (*)    Hemoglobin 16.0 (*)    HCT 47.0 (*)    All other components within normal limits  CBG MONITORING, ED - Abnormal; Notable for the following components:   Glucose-Capillary 521 (*)    All other components within normal limits  CBG MONITORING, ED - Abnormal; Notable for the following components:   Glucose-Capillary 486 (*)    All other components within normal limits  URINALYSIS, ROUTINE W REFLEX MICROSCOPIC  CBC WITH DIFFERENTIAL/PLATELET  VALPROIC  ACID LEVEL  LACTIC ACID, PLASMA  BASIC METABOLIC PANEL WITH GFR  BASIC METABOLIC PANEL WITH GFR  BASIC METABOLIC PANEL WITH GFR  BASIC METABOLIC PANEL WITH GFR  BASIC METABOLIC PANEL WITH GFR  BETA-HYDROXYBUTYRIC ACID  BETA-HYDROXYBUTYRIC ACID  BETA-HYDROXYBUTYRIC ACID  BLOOD GAS, VENOUS  MAGNESIUM    PHOSPHORUS  I-STAT VENOUS BLOOD GAS, ED    DG Chest Port 1 View  Final Result      Medications  insulin  regular, human (MYXREDLIN ) 100 units/ 100 mL infusion (9 Units/hr Intravenous New Bag/Given 01/14/24 0047)  lactated ringers  infusion ( Intravenous New Bag/Given 01/14/24 0055)  dextrose  5 % in lactated ringers  infusion (has no administration in time range)  dextrose  50 % solution 0-50 mL (has no administration in time range)  sodium chloride  flush (NS) 0.9 % injection 10-40 mL (has no administration in time range)  potassium chloride  10 mEq in 100 mL IVPB (has no administration in time range)  heparin  injection 5,000 Units (has no administration in time range)  sodium chloride  flush (NS) 0.9 % injection 3 mL (has no administration in time range)  lactated ringers  bolus 1,000 mL (0 mLs Intravenous Stopped 01/14/24 0143)  lactated ringers  bolus 1,396 mL (1,396 mLs Intravenous New Bag/Given 01/14/24 0021)     Procedures  /  Critical Care .Critical Care  Performed by: Sherel Dikes, PA-C Authorized by: Sherel Dikes, PA-C   Critical care provider statement:    Critical care time (minutes):  65   Critical care was necessary to treat or prevent imminent or life-threatening deterioration of the following conditions:  Metabolic crisis   Critical care was time spent personally by me on the following activities:  Development of treatment plan with patient or surrogate, discussions with consultants, evaluation of patient's response to treatment, examination of patient, ordering and review of laboratory studies, ordering and review of radiographic studies, ordering and performing treatments and interventions, pulse oximetry, re-evaluation of patient's condition and review of old charts   ED Course and Medical Decision Making  I have reviewed the triage vital signs, the nursing notes, and pertinent available records from the EMR.  Social Determinants Affecting Complexity of Care: Patient has  problems living alone.   ED Course: Clinical Course as of 01/14/24 0241  Fri Jan 14, 2024  0035 I-STAT Chem-8 is not crossing over, will repeat. Sodium 133 Potassium 6.0 Chloride 105 Calcium  IM 1.02 Glucose 660 BUN 41 Creatinine 2.3 Hemoglobin 17.3 [RB]  0120 I-Stat venous blood gas, ([facility] ED, [facility], [facility]) Venous pH is 7.17 [RB]  0240 Initial glucose greater than 600 [RB]  0240 I-Stat CG4 Lactic Acid, ED(!!) Lactic is 5.4, getting fluids [RB]  0240 CBC with Differential/Platelet(!) Leukocytosis to 16.3 [RB]  0240 Basic metabolic panel with GFR(!!) Creatinine is 3.3, increased from previous, will continue with fluids  Bicarb is less than 7, anion gap estimated at 31 [RB]  0241 Beta-hydroxybutyric acid(!) Beta hydroxy greater  than 8 [RB]  0241 Labs, vitals, and presentation are consistent with DKA.  Will admit to hospitalist service. [RB]    Clinical Course User Index [RB] Sherel Dikes, PA-C    Medical Decision Making Patient here with suspected DKA.  Patient has known to have history of noncompliance with her medications.  She was brought in with coo small respirations, tachypneic to the 30s, tachycardic to the 140s.  She was initially confused and had GCS of 11.  Patient was started on IV fluids, insulin , and was put on BiPAP due to increased work of breathing and kussmaul respirations.    During her ED course, patient has improved quite a bit.  She looks much better on BiPAP.  She is now alert and able to answer basic questions.  I discussed case with ICU, feels that patient can be admitted to medicine service.  See consult notes below.  Problems Addressed: AKI (acute kidney injury) [facility]): acute illness or injury Diabetic ketoacidosis without coma associated with type 2 diabetes mellitus (HCC): acute illness or injury  Amount and/or Complexity of Data Reviewed Labs: ordered. Decision-making details documented in ED Course. Radiology: ordered. ECG/medicine tests:  ordered.  Risk Prescription drug management. Decision regarding hospitalization.         Consultants: I consulted with Dr. Villa Greaser, PCCM, who recommends admission to medicine. I consulted with Dr. Segars, who is appreciated for admitting.   Treatment and Plan: Patient's exam and diagnostic results are concerning for DKA.  Feel that patient will need admission to the hospital for further treatment and evaluation.  Patient seen by and discussed with attending physician, Dr. Maralee Senate, who agrees with plan.  Final Clinical Impressions(s) / ED Diagnoses     ICD-10-CM   1. Diabetic ketoacidosis without coma associated with type 2 diabetes mellitus (HCC)  E11.10     2. AKI (acute kidney injury) (HCC)  N17.9       ED Discharge Orders     None         Discharge Instructions Discussed with and Provided to Patient:   Discharge Instructions   None      Sherel Dikes, PA-C 01/14/24 0241    Guadalupe Lee, MD 01/14/24 [phone]

## 2024-01-13 NOTE — Telephone Encounter (Signed)
 Pt's father called asking about a missed call. No missed call note found from today in pt's chart. He was advised about upcoming appt 5/14, verbalized understanding.

## 2024-01-13 NOTE — ED Notes (Addendum)
 Lab called and 2 samples are hemolyzed (light green)

## 2024-01-13 NOTE — ED Notes (Signed)
 Caled and placed PT on monitor with CCMD

## 2024-01-13 NOTE — ED Notes (Signed)
 IV team bedside, more than 4 unsuccesfull attempts made by 2 different RN'S and a PA.

## 2024-01-13 NOTE — Progress Notes (Signed)
 Radiation Oncology         [phone] ________________________________  Initial Outpatient Consultation  Name: Carolyn Norton MRN: [id number]  Date: 01/13/2024  DOB: [birth year]  CC:Carolyn Fought, PA  Carolyn Flies, MD   REFERRING PHYSICIAN: Derrel Flies, MD  DIAGNOSIS: The encounter diagnosis was Endometrial cancer [facility]).  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  HISTORY OF PRESENT ILLNESS::Carolyn Norton is a 58 y.o. female who is accompanied by her caregiver. she is seen as a courtesy of Dr. Daisey Norton for an opinion concerning radiation therapy as part of management for her recently diagnosed endometrial cancer.  The patient presented to her GYN annual examination on 08/17/23 during which she reported an episode of post menstrual bleeding. As a result, she underwent a pap smear and endometrial biopsy were obtained.  Pap smear returned NILM.  Pathology of  endometrial biopsy revealed endometrioid adenocarcinoma FIGO grade 1-2, p53 wild-type.  In light of findings, she was then referred to Dr. Daisey Norton on 09/06/23 to discuss further treatment plans. Upon discussion, patient opted to proceed with an MRI to evaluate myometrial invasion and tumor size and a pelvic exam under anesthesia, dilation and curettage, intrauterine device placement on 10/05/23.     Subsequently, she underwent an MRI of the pelvis on 09/20/23 showing endometrial lesion involves the right cornua region of the uterus. There is evidence of myometrial invasion which is greater than 50% of myometrial thickness and estimated to be within 2 mm of the serosa. Scan also noted a 13 mm nonenhancing cyst in the right adnexa is likely a benign paraovarian cyst.   During her most recent follow up with Dr. Daisey Norton on 01/03/24, due to her ongoing significant medical comorbidities, she concluded that patient is a poor candidate for surgery, and her deep myometrial invasion which makes her a suboptimal  candidate for medication management alone, recommend consideration of radiation treatment for definitive treatment of endometrial cancer.     Of note: patient was hospitalized from 10/30/23 to 11/05/23 due to a septic shock and DKA, intubated and admitted in the ICU. Patient was on pressor support which was weaned off gradually. Patient was extubated on 10/31/2023. Several CT head were preformed on that time that showed no acute intracranial abnormalities.   Patient then returned to the ED on 4/17 and was admitted into inpatient care till 4/21 due to altered mental status, respiratory distress, hyperglycemia. On admission she was disoriented diaphoretic with Kussmaul's breathing and was intubated for respiratory distress. CT head showed no acute findings.        Patient is present with a nurse from her nursing facility. She is a poor historian and her father was unfortunately unable to make this appointment. Patient states she is experiencing abdominal pain and vaginal bleeding. Her caretaker was able to confirm the presence of blood in her underwear last week.   PREVIOUS RADIATION THERAPY: No  PAST MEDICAL HISTORY:  Past Medical History:  Diagnosis Date   Alzheimer disease (HCC)    from facility diagnosis, 12/10/22- orientation has improved since she had shunt placed.   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   COVID    History of   Depression    Diabetes mellitus without complication (HCC)    Type 2 (type 1 per patient and aunt)   Dyspnea    Hyperlipidemia    Hypertension  Normal pressure hydrocephalus (HCC)    Seasonal allergies     PAST SURGICAL HISTORY: Past Surgical History:  Procedure Laterality Date   BREAST EXCISIONAL BIOPSY Right    BREAST SURGERY     right breast   CATARACT EXTRACTION W/PHACO Left 02/12/2023   Procedure: CATARACT EXTRACTION PHACO AND INTRAOCULAR LENS PLACEMENT (IOC);  Surgeon:  Tarri Farm, MD;  Location: [facility];  Service: Ophthalmology;  Laterality: Left;  CDE: 22.05   CATARACT EXTRACTION W/PHACO Right 02/26/2023   Procedure: CATARACT EXTRACTION PHACO AND INTRAOCULAR LENS PLACEMENT (IOC);  Surgeon: Tarri Farm, MD;  Location: [facility];  Service: Ophthalmology;  Laterality: Right;  CDE: 24.40   TOOTH EXTRACTION N/A 12/11/2022   Procedure: DENTAL RESTORATION/EXTRACTIONS;  Surgeon: Ascencion Lava, DMD;  Location: [facility] OR;  Service: Oral Surgery;  Laterality: N/A;   VENTRICULOPERITONEAL SHUNT N/A 05/29/2022   Procedure: Ventriculoperitoneal shunt placement;  Surgeon: Gearl Keens, MD;  Location: [facility] OR;  Service: Neurosurgery;  Laterality: N/A;    FAMILY HISTORY:  Family History  Problem Relation Age of Onset   Hypertension Mother    Kidney cancer Mother    Prostate cancer Father    Leukemia Brother    Breast cancer Paternal Grandmother    Endometrial cancer Neg Hx    Colon cancer Neg Hx    Ovarian cancer Neg Hx     SOCIAL HISTORY:  Social History   Tobacco Use   Smoking status: Every Day    Current packs/day: 0.50    Average packs/day: 0.5 packs/day for 1 year (0.5 ttl pk-yrs)    Types: Cigarettes    Start date: 01/06/2023    Last attempt to quit: 04/07/2021   Smokeless tobacco: Never  Vaping Use   Vaping status: Never Used  Substance Use Topics   Alcohol use: No   Drug use: Not Currently    Types: Cocaine    ALLERGIES:  Allergies  Allergen Reactions   Erythromycin Other (See Comments)    Childhood reaction    MEDICATIONS:  No current facility-administered medications for this encounter.   No current outpatient medications on file.   Facility-Administered Medications Ordered in Other Encounters  Medication Dose Route Frequency Provider Last Rate Last Admin   acetaminophen  (TYLENOL ) tablet 1,000 mg  1,000 mg Oral Q6H PRN Arnulfo Larch, MD   1,000 mg at 01/16/24 1610   albuterol  (PROVENTIL ) (2.5 MG/3ML) 0.083% nebulizer solution 2.5 mg  2.5 mg  Nebulization Q4H PRN Segars, Arlyce Lambert, MD       atorvastatin  (LIPITOR) tablet 20 mg  20 mg Oral Daily Segars, Arlyce Lambert, MD   20 mg at 01/17/24 0901   dextrose  50 % solution 0-50 mL  0-50 mL Intravenous PRN Sherel Dikes, PA-C       Gerhardt's butt cream   Topical TID Macdonald Savoy, MD   Given at 01/17/24 1555   insulin  aspart (novoLOG ) injection 0-15 Units  0-15 Units Subcutaneous TID WC Macdonald Savoy, MD   5 Units at 01/17/24 1202   insulin  aspart (novoLOG ) injection 0-5 Units  0-5 Units Subcutaneous QHS Macdonald Savoy, MD       insulin  aspart (novoLOG ) injection 4 Units  4 Units Subcutaneous TID WC Macdonald Savoy, MD   4 Units at 01/17/24 1203   insulin  glargine-yfgn (SEMGLEE ) injection 10 Units  10 Units Subcutaneous BID Rathore, Vasundhra, MD   10 Units at 01/17/24 0908   lamoTRIgine  (LAMICTAL ) tablet 50 mg  50 mg Oral BID Segars, Jonathan, MD   50  mg at 01/17/24 0901   megestrol  (MEGACE ) tablet 40 mg  40 mg Oral Daily Segars, Jonathan, MD   40 mg at 01/17/24 0901   melatonin tablet 6 mg  6 mg Oral QHS PRN Arnulfo Larch, MD       ondansetron  (ZOFRAN ) injection 4 mg  4 mg Intravenous Q6H PRN Segars, Arlyce Lambert, MD       pantoprazole  (PROTONIX ) EC tablet 40 mg  40 mg Oral Daily Segars, Jonathan, MD   40 mg at 01/17/24 0901   polyethylene glycol (MIRALAX  / GLYCOLAX ) packet 17 g  17 g Oral Daily PRN Arnulfo Larch, MD       sodium chloride  flush (NS) 0.9 % injection 10-40 mL  10-40 mL Intracatheter PRN Palumbo, April, MD       sodium chloride  flush (NS) 0.9 % injection 3 mL  3 mL Intravenous Q12H Arnulfo Larch, MD   3 mL at 01/17/24 0909    REVIEW OF SYSTEMS: Notable for that above.    PHYSICAL EXAM:  height is 5\' 4"  (1.626 m). Her temporal temperature is 97.3 F (36.3 C) (abnormal). Her blood pressure is 103/78 and her pulse is 114 (abnormal). Her respiration is 20 and oxygen saturation is 99%.   General: Alert and oriented, in no acute distress. Patient is a  poor historian.  HEENT: Head is normocephalic. Extraocular movements are intact. Oropharynx is clear. Neck: Neck is supple, no palpable cervical or supraclavicular lymphadenopathy. Heart: Regular in rate and rhythm with no murmurs, rubs, or gallops. Chest: Clear to auscultation bilaterally, with no rhonchi, wheezes, or rales. Abdomen: Soft, nontender, nondistended, with no rigidity or guarding. Extremities: No cyanosis or edema. Lymphatics: see Neck Exam Skin: No concerning lesions. Musculoskeletal: symmetric strength and muscle tone throughout. Patient is in a wheelchair today.  Neurologic: Cranial nerves II through XII are grossly intact. No obvious focalities. Speech is fluent. Coordination is intact. Psychiatric: Diminished cognitive function  Pelvic exam deferred today as it will not affect her course of treatment.  GU exam per Dr. Daisey Norton on 01/03/2024: "GU: Normal appearing external genitalia without erythema, excoriation, or lesions.  Speculum exam reveals small amount of pink-tinged mucus discharge in vaginal vault.  Normal-appearing cervix and vaginal mucosa.  Bimanual exam reveals normal cervix, mobile uterus"  ECOG = 1  0 - Asymptomatic (Fully active, able to carry on all predisease activities without restriction)  1 - Symptomatic but completely ambulatory (Restricted in physically strenuous activity but ambulatory and able to carry out work of a light or sedentary nature. For example, light housework, office work)  2 - Symptomatic, <50% in bed during the day (Ambulatory and capable of all self care but unable to carry out any work activities. Up and about more than 50% of waking hours)  3 - Symptomatic, >50% in bed, but not bedbound (Capable of only limited self-care, confined to bed or chair 50% or more of waking hours)  4 - Bedbound (Completely disabled. Cannot carry on any self-care. Totally confined to bed or chair)  5 - Death   Aurea Blossom MM, Creech RH, Tormey DC, et al.  [phone]). "Toxicity and response criteria of the [facility]". Am. Hillard Lowes. Oncol. 5 (6): 649-55  LABORATORY DATA:  Lab Results  Component Value Date   WBC 12.6 (H) 01/15/2024   HGB 12.8 01/15/2024   HCT 40.4 01/15/2024   MCV 90.0 01/15/2024   PLT 249 01/15/2024   NEUTROABS 13.1 (H) 01/14/2024   Lab Results  Component Value Date  NA 138 01/17/2024   K 3.4 (L) 01/17/2024   CL 105 01/17/2024   CO2 23 01/17/2024   GLUCOSE 76 01/17/2024   BUN 12 01/17/2024   CREATININE 0.98 01/17/2024   CALCIUM  8.2 (L) 01/17/2024      RADIOGRAPHY: VAS US  UPPER EXTREMITY VENOUS DUPLEX Result Date: 01/15/2024 UPPER VENOUS STUDY  Patient Name:  BRITZEL FORYS  Date of Exam:   01/15/2024 Medical Rec #: [id number]           Accession #:    [id number] Date of Birth: Sep 18, 1965          Patient Gender: F Patient Age:   64 years Exam Location:  [facility] Procedure:      VAS US  UPPER EXTREMITY VENOUS DUPLEX Referring Phys: Corky Diener RATHORE --------------------------------------------------------------------------------  Indications: Left upper extremity pain, swelling, erythema status post IV infiltration Limitations: Poor ultrasound/tissue interface and patient unable to extend and rotate arm. Comparison Study: No prior study Performing Technologist: Carleene Chase RVS  Examination Guidelines: A complete evaluation includes B-mode imaging, spectral Doppler, color Doppler, and power Doppler as needed of all accessible portions of each vessel. Bilateral testing is considered an integral part of a complete examination. Limited examinations for reoccurring indications may be performed as noted.  Right Findings: +----------+------------+---------+-----------+----------+-------+ RIGHT     CompressiblePhasicitySpontaneousPropertiesSummary +----------+------------+---------+-----------+----------+-------+ Subclavian               Yes       Yes                       +----------+------------+---------+-----------+----------+-------+  Left Findings: +----------+------------+---------+-----------+----------+-------+ LEFT      CompressiblePhasicitySpontaneousPropertiesSummary +----------+------------+---------+-----------+----------+-------+ IJV           Full       Yes       Yes                      +----------+------------+---------+-----------+----------+-------+ Subclavian    Full       Yes       Yes                      +----------+------------+---------+-----------+----------+-------+ Axillary      Full       Yes       Yes                      +----------+------------+---------+-----------+----------+-------+ Brachial      Full                                          +----------+------------+---------+-----------+----------+-------+ Radial        Full                                          +----------+------------+---------+-----------+----------+-------+ Ulnar         Full                                          +----------+------------+---------+-----------+----------+-------+ Cephalic      Full                                          +----------+------------+---------+-----------+----------+-------+  Basilic       Full                                          +----------+------------+---------+-----------+----------+-------+  Summary:  Right: No evidence of superficial vein thrombosis in the upper extremity.  Left: No evidence of deep vein thrombosis in the upper extremity. No evidence of superficial vein thrombosis in the upper extremity.  *See table(s) above for measurements and observations.  Diagnosing physician: Angela Kell MD Electronically signed by Angela Kell MD on 01/15/2024 at 8:04:16 PM.    Final    CT HEAD CODE STROKE WO CONTRAST` Result Date: 01/15/2024 CLINICAL DATA:  Code stroke. EXAM: CT HEAD WITHOUT CONTRAST TECHNIQUE: Contiguous axial images were obtained from the base of the skull  through the vertex without intravenous contrast. RADIATION DOSE REDUCTION: This exam was performed according to the departmental dose-optimization program which includes automated exposure control, adjustment of the mA and/or kV according to patient size and/or use of iterative reconstruction technique. COMPARISON:  Prior study from 12/24/2023 FINDINGS: Brain: Atrophy with moderate chronic microvascular ischemic disease. Chronic right frontal encephalomalacia. Right parietal approach shunt catheter in place with tip terminating at the septum pellucidum. Stable ventricular size and morphology without progressive hydrocephalus. No acute intracranial hemorrhage. No acute large vessel territory infarct. No mass lesion or midline shift. No extra-axial fluid collection. Vascular: No abnormal hyperdense vessel. Scattered vascular calcifications noted within the carotid siphons. Skull: Scalp soft tissues demonstrate no acute finding. Calvarium intact. Sinuses/Orbits: Globes and orbital soft tissues within normal limits. Paranasal sinuses are largely clear. No mastoid effusion. Other: None. ASPECTS [facility] Stroke Program Early CT Score) - Ganglionic level infarction (caudate, lentiform nuclei, internal capsule, insula, M1-M3 cortex): 7 - Supraganglionic infarction (M4-M6 cortex): 3 Total score (0-10 with 10 being normal): 10 IMPRESSION: 1. No acute intracranial abnormality. 2. ASPECTS is 10. 3. Right parietal approach VP shunt catheter in place. Stable ventricular size and morphology without progressive hydrocephalus. 4. Underlying atrophy with chronic small vessel ischemic disease, with chronic right frontal encephalomalacia. These results were communicated to choose for at 1:08 am on 01/15/2024 by text page via the [facility] messaging system. Electronically Signed   By: Virgia Griffins M.D.   On: 01/15/2024 01:09   US  RENAL Result Date: 01/14/2024 CLINICAL DATA:  Acute kidney injury EXAM: RENAL / URINARY TRACT  ULTRASOUND COMPLETE COMPARISON:  None Available. FINDINGS: Right Kidney: Renal measurements: 9.2 x 4.2 x 4.1 cm = volume: 82 mL. Somewhat small volume but no renal cortical thinning. Echogenicity within normal limits. No mass or hydronephrosis visualized. Left Kidney: Renal measurements: 9.6 x 4.5 x 4.5 cm = volume: 102 mL. Echogenicity within normal limits. No mass or hydronephrosis visualized. Bladder: Appears normal for degree of bladder distention. IMPRESSION: No hydronephrosis, mass, or cortical thinning. Electronically Signed   By: Ronnette Coke M.D.   On: 01/14/2024 05:24   DG Chest Port 1 View Result Date: 01/13/2024 CLINICAL DATA:  Shortness of breath and hyperglycemia, altered mental status EXAM: PORTABLE CHEST 1 VIEW COMPARISON:  12/23/2023 FINDINGS: Stable cardiomediastinal silhouette. Aortic atherosclerotic calcification. Left basilar and right mid lung atelectasis. Low lung volumes accentuate pulmonary vascularity. No pleural effusion or pneumothorax. Partially visualized right chest VP shunt. IMPRESSION: Low lung volumes with bilateral atelectasis. Electronically Signed   By: Rozell Cornet M.D.   On: 01/13/2024 22:40   CT HEAD WO CONTRAST ( ) Result  Date: 12/24/2023 CLINICAL DATA:  58 year old female with altered mental status. History of NPH and shunt. EXAM: CT HEAD WITHOUT CONTRAST TECHNIQUE: Contiguous axial images were obtained from the base of the skull through the vertex without intravenous contrast. RADIATION DOSE REDUCTION: This exam was performed according to the departmental dose-optimization program which includes automated exposure control, adjustment of the mA and/or kV according to patient size and/or use of iterative reconstruction technique. COMPARISON:  Brain MRI 04/01/2021, head CT 10/06/2023. FINDINGS: Brain: Right posterior approach ventriculostomy catheter is stable since January, communicates with the atrium of the right lateral ventricle on coronal image 41.  Terminates in the midline as before. Stable ventricle size and configuration. No midline shift, mass effect, or evidence of intracranial mass lesion. No acute intracranial hemorrhage identified. Stable chronic right anterior superior frontal gyrus encephalomalacia, patchy bilateral white matter hypodensity. No cortically based acute infarct identified. Vascular: No suspicious intracranial vascular hyperdensity. Calcified atherosclerosis at the skull base. Skull: Stable.  No acute osseous abnormality identified. Sinuses/Orbits: Visualized paranasal sinuses and mastoids are stable and well aerated. Other: Right posterior convexity shunt reservoir and tubing appears stable with no adverse features. Stable orbits. IMPRESSION: Stable non contrast CT appearance of the brain, including right posterior approach ventriculostomy shunt with no adverse features. Electronically Signed   By: Marlise Simpers M.D.   On: 12/24/2023 11:15   DG CHEST PORT 1 VIEW Result Date: 12/23/2023 CLINICAL DATA:  Central line placement EXAM: PORTABLE CHEST 1 VIEW COMPARISON:  12/23/2023 FINDINGS: Right internal jugular central venous catheter tip is seen within the right atrium roughly 5 cm below the superior cavoatrial junction. Endotracheal tube seen 2.7 cm above the carina. Nasogastric tube tip overlies the proximal body of the stomach. Lung volumes are small, but are stable since prior examination. Lungs are clear. No pneumothorax or pleural effusion. Cardiac size within normal limits. No acute bone abnormality. IMPRESSION: 1. Right internal jugular central venous catheter tip within the right atrium roughly 5 cm below the superior cavoatrial junction. No pneumothorax. Electronically Signed   By: Worthy Heads M.D.   On: 12/23/2023 20:30   DG Chest 1 View Result Date: 12/23/2023 CLINICAL DATA:  Shortness of breath EXAM: CHEST  1 VIEW COMPARISON:  Chest x-ray 10/30/2023 FINDINGS: Endotracheal tube tip is 2.6 cm above the carina. Enteric tube  tip is in the body of the stomach. The cardiomediastinal silhouette is within normal limits. There is a small band of atelectasis in the right mid lung. The lungs are otherwise clear. There is no pleural effusion or pneumothorax. Healed right-sided rib fractures are seen. IMPRESSION: 1. Endotracheal tube tip is 2.6 cm above the carina. 2. Enteric tube tip is in the body of the stomach. 3. Small band of atelectasis in the right mid lung. Electronically Signed   By: Tyron Gallon M.D.   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  We have reviewed this patient's current work-up. She has met with Dr. Daisey Norton and was deemed a poor surgical candidate due to her ongoing significant medical comorbidities. Given her deep myometrial invasion, medication management alone would likely be palliative in nature. Dr. Eloise Hake is offering a  definitive 6-week course of external radiation to the pelvis. Given her preexisting health conditions, she would likely not tolerate brachytherapy with hymen capsules.  Today, I talked to the patient and caregiver about the findings and work-up thus far.  We discussed the natural history of endometrial cancer and general  treatment, highlighting the role of radiotherapy in the management.  We discussed the available radiation techniques, and focused on the details of logistics and delivery.  We reviewed the anticipated acute and late sequelae associated with radiation in this setting.  The patient was encouraged to ask questions that I answered to the best of my ability. We are currently working to get in contact with patient's father who is her POA to review treatment plans and consent form.   PLAN: Patient is scheduled for CT simulation on 01/19/2024. Anticipate 6 weeks of EBRT.     60 minutes of total time was spent for this patient encounter, including preparation, face-to-face counseling with the patient and coordination of  care, physical exam, and documentation of the encounter.   ------------------------------------------------   Julio Ohm, PA-C   Noralee Beam, PhD, MD   [facility]  Radiation Oncology Direct Dial: [phone]  Fax: [phone] .com    This document serves as a record of services personally performed by Retta Caster, MD and Julio Ohm, PA-C. It was created on his behalf by Lucky Sable, a trained medical scribe. The creation of this record is based on the scribe's personal observations and the provider's statements to them. This document has been checked and approved by the attending provider.

## 2024-01-14 ENCOUNTER — Inpatient Hospital Stay (HOSPITAL_COMMUNITY)

## 2024-01-14 DIAGNOSIS — R6510 Systemic inflammatory response syndrome (SIRS) of non-infectious origin without acute organ dysfunction: Secondary | ICD-10-CM | POA: Diagnosis present

## 2024-01-14 DIAGNOSIS — N179 Acute kidney failure, unspecified: Secondary | ICD-10-CM

## 2024-01-14 DIAGNOSIS — I959 Hypotension, unspecified: Secondary | ICD-10-CM | POA: Diagnosis present

## 2024-01-14 DIAGNOSIS — J9601 Acute respiratory failure with hypoxia: Secondary | ICD-10-CM | POA: Insufficient documentation

## 2024-01-14 DIAGNOSIS — N181 Chronic kidney disease, stage 1: Secondary | ICD-10-CM | POA: Diagnosis present

## 2024-01-14 DIAGNOSIS — G912 (Idiopathic) normal pressure hydrocephalus: Secondary | ICD-10-CM | POA: Diagnosis present

## 2024-01-14 DIAGNOSIS — D751 Secondary polycythemia: Secondary | ICD-10-CM | POA: Diagnosis present

## 2024-01-14 DIAGNOSIS — G9341 Metabolic encephalopathy: Secondary | ICD-10-CM | POA: Diagnosis present

## 2024-01-14 DIAGNOSIS — E785 Hyperlipidemia, unspecified: Secondary | ICD-10-CM | POA: Diagnosis present

## 2024-01-14 DIAGNOSIS — J45909 Unspecified asthma, uncomplicated: Secondary | ICD-10-CM | POA: Diagnosis present

## 2024-01-14 DIAGNOSIS — I129 Hypertensive chronic kidney disease with stage 1 through stage 4 chronic kidney disease, or unspecified chronic kidney disease: Secondary | ICD-10-CM | POA: Diagnosis present

## 2024-01-14 DIAGNOSIS — I9589 Other hypotension: Secondary | ICD-10-CM | POA: Diagnosis not present

## 2024-01-14 DIAGNOSIS — G9340 Encephalopathy, unspecified: Secondary | ICD-10-CM | POA: Insufficient documentation

## 2024-01-14 DIAGNOSIS — G309 Alzheimer's disease, unspecified: Secondary | ICD-10-CM | POA: Diagnosis present

## 2024-01-14 DIAGNOSIS — Z8542 Personal history of malignant neoplasm of other parts of uterus: Secondary | ICD-10-CM | POA: Diagnosis not present

## 2024-01-14 DIAGNOSIS — Z87891 Personal history of nicotine dependence: Secondary | ICD-10-CM | POA: Diagnosis not present

## 2024-01-14 DIAGNOSIS — E1110 Type 2 diabetes mellitus with ketoacidosis without coma: Secondary | ICD-10-CM | POA: Diagnosis present

## 2024-01-14 DIAGNOSIS — Z794 Long term (current) use of insulin: Secondary | ICD-10-CM | POA: Diagnosis not present

## 2024-01-14 DIAGNOSIS — F319 Bipolar disorder, unspecified: Secondary | ICD-10-CM | POA: Diagnosis present

## 2024-01-14 DIAGNOSIS — R4182 Altered mental status, unspecified: Secondary | ICD-10-CM | POA: Diagnosis present

## 2024-01-14 DIAGNOSIS — E1122 Type 2 diabetes mellitus with diabetic chronic kidney disease: Secondary | ICD-10-CM | POA: Diagnosis present

## 2024-01-14 DIAGNOSIS — Z982 Presence of cerebrospinal fluid drainage device: Secondary | ICD-10-CM | POA: Diagnosis not present

## 2024-01-14 DIAGNOSIS — F0283 Dementia in other diseases classified elsewhere, unspecified severity, with mood disturbance: Secondary | ICD-10-CM | POA: Diagnosis present

## 2024-01-14 DIAGNOSIS — E860 Dehydration: Secondary | ICD-10-CM | POA: Diagnosis present

## 2024-01-14 DIAGNOSIS — Z602 Problems related to living alone: Secondary | ICD-10-CM | POA: Diagnosis present

## 2024-01-14 DIAGNOSIS — Z8249 Family history of ischemic heart disease and other diseases of the circulatory system: Secondary | ICD-10-CM | POA: Diagnosis not present

## 2024-01-14 DIAGNOSIS — Z8616 Personal history of COVID-19: Secondary | ICD-10-CM | POA: Diagnosis not present

## 2024-01-14 DIAGNOSIS — M7989 Other specified soft tissue disorders: Secondary | ICD-10-CM | POA: Diagnosis not present

## 2024-01-14 LAB — LACTIC ACID, PLASMA: Lactic Acid, Venous: 5.3 mmol/L (ref 0.5–1.9)

## 2024-01-14 LAB — BASIC METABOLIC PANEL WITH GFR
Anion gap: 25 — ABNORMAL HIGH (ref 5–15)
Anion gap: 20 — ABNORMAL HIGH (ref 5–15)
Anion gap: 19 — ABNORMAL HIGH (ref 5–15)
BUN: 30 mg/dL — ABNORMAL HIGH (ref 6–20)
BUN: 27 mg/dL — ABNORMAL HIGH (ref 6–20)
BUN: 25 mg/dL — ABNORMAL HIGH (ref 6–20)
BUN: 25 mg/dL — ABNORMAL HIGH (ref 6–20)
CO2: <7 mmol/L — ABNORMAL LOW (ref 22–32)
CO2: 14 mmol/L — ABNORMAL LOW (ref 22–32)
CO2: 20 mmol/L — ABNORMAL LOW (ref 22–32)
CO2: 20 mmol/L — ABNORMAL LOW (ref 22–32)
Calcium: 9.3 mg/dL (ref 8.9–10.3)
Calcium: 9.3 mg/dL (ref 8.9–10.3)
Calcium: 8.9 mg/dL (ref 8.9–10.3)
Calcium: 8.9 mg/dL (ref 8.9–10.3)
Chloride: 96 mmol/L — ABNORMAL LOW (ref 98–111)
Chloride: 103 mmol/L (ref 98–111)
Chloride: 102 mmol/L (ref 98–111)
Chloride: 105 mmol/L (ref 98–111)
Creatinine, Ser: 3.37 mg/dL — ABNORMAL HIGH (ref 0.44–1.00)
Creatinine, Ser: 2.56 mg/dL — ABNORMAL HIGH (ref 0.44–1.00)
Creatinine, Ser: 2.03 mg/dL — ABNORMAL HIGH (ref 0.44–1.00)
Creatinine, Ser: 1.92 mg/dL — ABNORMAL HIGH (ref 0.44–1.00)
GFR, Estimated: 15 mL/min — ABNORMAL LOW (ref >60)
GFR, Estimated: 21 mL/min — ABNORMAL LOW (ref >60)
GFR, Estimated: 28 mL/min — ABNORMAL LOW (ref >60)
GFR, Estimated: 30 mL/min — ABNORMAL LOW (ref >60)
Glucose, Bld: 567 mg/dL (ref 70–99)
Glucose, Bld: 299 mg/dL — ABNORMAL HIGH (ref 70–99)
Glucose, Bld: 153 mg/dL — ABNORMAL HIGH (ref 70–99)
Glucose, Bld: 174 mg/dL — ABNORMAL HIGH (ref 70–99)
Potassium: 4.5 mmol/L (ref 3.5–5.1)
Potassium: 3.8 mmol/L (ref 3.5–5.1)
Potassium: 4.3 mmol/L (ref 3.5–5.1)
Potassium: 4.6 mmol/L (ref 3.5–5.1)
Sodium: 141 mmol/L (ref 135–145)
Sodium: 142 mmol/L (ref 135–145)
Sodium: 142 mmol/L (ref 135–145)
Sodium: 144 mmol/L (ref 135–145)

## 2024-01-14 LAB — CBG MONITORING, ED
Glucose-Capillary: 152 mg/dL — ABNORMAL HIGH (ref 70–99)
Glucose-Capillary: 156 mg/dL — ABNORMAL HIGH (ref 70–99)
Glucose-Capillary: 170 mg/dL — ABNORMAL HIGH (ref 70–99)
Glucose-Capillary: 179 mg/dL — ABNORMAL HIGH (ref 70–99)
Glucose-Capillary: 257 mg/dL — ABNORMAL HIGH (ref 70–99)
Glucose-Capillary: 272 mg/dL — ABNORMAL HIGH (ref 70–99)
Glucose-Capillary: 325 mg/dL — ABNORMAL HIGH (ref 70–99)
Glucose-Capillary: 486 mg/dL — ABNORMAL HIGH (ref 70–99)
Glucose-Capillary: 521 mg/dL (ref 70–99)
Glucose-Capillary: 590 mg/dL (ref 70–99)

## 2024-01-14 LAB — CBC WITH DIFFERENTIAL/PLATELET
Abs Immature Granulocytes: 0.16 10*3/uL — ABNORMAL HIGH (ref 0.00–0.07)
Basophils Absolute: 0.1 10*3/uL (ref 0.0–0.1)
Basophils Relative: 1 %
Eosinophils Absolute: 0 10*3/uL (ref 0.0–0.5)
Eosinophils Relative: 0 %
HCT: 48.8 % — ABNORMAL HIGH (ref 36.0–46.0)
Hemoglobin: 14.6 g/dL (ref 12.0–15.0)
Immature Granulocytes: 1 %
Lymphocytes Relative: 7 %
Lymphs Abs: 1.1 10*3/uL (ref 0.7–4.0)
MCH: 28.7 pg (ref 26.0–34.0)
MCHC: 29.9 g/dL — ABNORMAL LOW (ref 30.0–36.0)
MCV: 96.1 fL (ref 80.0–100.0)
Monocytes Absolute: 1.9 10*3/uL — ABNORMAL HIGH (ref 0.1–1.0)
Monocytes Relative: 12 %
Neutro Abs: 13.1 10*3/uL — ABNORMAL HIGH (ref 1.7–7.7)
Neutrophils Relative %: 79 %
Platelets: 357 10*3/uL (ref 150–400)
RBC: 5.08 MIL/uL (ref 3.87–5.11)
RDW: 17.2 % — ABNORMAL HIGH (ref 11.5–15.5)
WBC: 16.3 10*3/uL — ABNORMAL HIGH (ref 4.0–10.5)
nRBC: 0 % (ref 0.0–0.2)

## 2024-01-14 LAB — GLUCOSE, CAPILLARY
Glucose-Capillary: 161 mg/dL — ABNORMAL HIGH (ref 70–99)
Glucose-Capillary: 152 mg/dL — ABNORMAL HIGH (ref 70–99)
Glucose-Capillary: 192 mg/dL — ABNORMAL HIGH (ref 70–99)
Glucose-Capillary: 238 mg/dL — ABNORMAL HIGH (ref 70–99)

## 2024-01-14 LAB — I-STAT VENOUS BLOOD GAS, ED
Acid-base deficit: 17 mmol/L — ABNORMAL HIGH (ref 0.0–2.0)
Bicarbonate: 7.7 mmol/L — ABNORMAL LOW (ref 20.0–28.0)
Calcium, Ion: 1.13 mmol/L — ABNORMAL LOW (ref 1.15–1.40)
HCT: 21 % — ABNORMAL LOW (ref 36.0–46.0)
Hemoglobin: 7.1 g/dL — ABNORMAL LOW (ref 12.0–15.0)
O2 Saturation: 63 %
Potassium: 3.7 mmol/L (ref 3.5–5.1)
Sodium: 144 mmol/L (ref 135–145)
TCO2: 8 mmol/L — ABNORMAL LOW (ref 22–32)
pCO2, Ven: 15.4 mmHg — CL (ref 44–60)
pH, Ven: 7.309 (ref 7.25–7.43)
pO2, Ven: 34 mmHg (ref 32–45)

## 2024-01-14 LAB — PHOSPHORUS: Phosphorus: 2.4 mg/dL — ABNORMAL LOW (ref 2.5–4.6)

## 2024-01-14 LAB — I-STAT CHEM 8, ED
BUN: 29 mg/dL — ABNORMAL HIGH (ref 6–20)
Calcium, Ion: 1.14 mmol/L — ABNORMAL LOW (ref 1.15–1.40)
Chloride: 104 mmol/L (ref 98–111)
Creatinine, Ser: 2.4 mg/dL — ABNORMAL HIGH (ref 0.44–1.00)
Glucose, Bld: 583 mg/dL (ref 70–99)
HCT: 47 % — ABNORMAL HIGH (ref 36.0–46.0)
Hemoglobin: 16 g/dL — ABNORMAL HIGH (ref 12.0–15.0)
Potassium: 4.5 mmol/L (ref 3.5–5.1)
Sodium: 137 mmol/L (ref 135–145)
TCO2: 8 mmol/L — ABNORMAL LOW (ref 22–32)

## 2024-01-14 LAB — HEPATIC FUNCTION PANEL
ALT: 13 U/L (ref 0–44)
AST: 23 U/L (ref 15–41)
Albumin: 2.8 g/dL — ABNORMAL LOW (ref 3.5–5.0)
Alkaline Phosphatase: 55 U/L (ref 38–126)
Bilirubin, Direct: <0.1 mg/dL (ref 0.0–0.2)
Total Bilirubin: 2.2 mg/dL — ABNORMAL HIGH (ref 0.0–1.2)
Total Protein: 6 g/dL — ABNORMAL LOW (ref 6.5–8.1)

## 2024-01-14 LAB — URINALYSIS, W/ REFLEX TO CULTURE (INFECTION SUSPECTED)
Bacteria, UA: NONE SEEN
Bilirubin Urine: NEGATIVE
Glucose, UA: >=500 mg/dL — AB
Ketones, ur: 80 mg/dL — AB
Leukocytes,Ua: NEGATIVE
Nitrite: NEGATIVE
Protein, ur: NEGATIVE mg/dL
Specific Gravity, Urine: 1.022 (ref 1.005–1.030)
pH: 5 (ref 5.0–8.0)

## 2024-01-14 LAB — CBC
HCT: 40.4 % (ref 36.0–46.0)
Hemoglobin: 12.9 g/dL (ref 12.0–15.0)
MCH: 28.6 pg (ref 26.0–34.0)
MCHC: 31.9 g/dL (ref 30.0–36.0)
MCV: 89.6 fL (ref 80.0–100.0)
Platelets: 276 10*3/uL (ref 150–400)
RBC: 4.51 MIL/uL (ref 3.87–5.11)
RDW: 17.1 % — ABNORMAL HIGH (ref 11.5–15.5)
WBC: 14.8 10*3/uL — ABNORMAL HIGH (ref 4.0–10.5)
nRBC: 0 % (ref 0.0–0.2)

## 2024-01-14 LAB — BETA-HYDROXYBUTYRIC ACID
Beta-Hydroxybutyric Acid: 6.28 mmol/L — ABNORMAL HIGH (ref 0.05–0.27)
Beta-Hydroxybutyric Acid: 3.68 mmol/L — ABNORMAL HIGH (ref 0.05–0.27)

## 2024-01-14 LAB — MAGNESIUM: Magnesium: 1.8 mg/dL (ref 1.7–2.4)

## 2024-01-14 LAB — VALPROIC ACID LEVEL: Valproic Acid Lvl: 16 ug/mL — ABNORMAL LOW (ref 50–100)

## 2024-01-14 MED ORDER — INSULIN ASPART 100 UNIT/ML IJ SOLN: 3.0000 [IU] | Freq: Three times a day (TID) | INTRAMUSCULAR | Status: DC

## 2024-01-14 MED ORDER — SODIUM CHLORIDE 0.9% FLUSH
3.0000 mL | Freq: Two times a day (BID) | INTRAVENOUS | Status: DC
Start: 2024-01-14 — End: 2024-01-18
  Administered 2024-01-14 – 2024-01-18 (×7): 3 mL via INTRAVENOUS

## 2024-01-14 MED ORDER — DIVALPROEX SODIUM 250 MG PO DR TAB
250.0000 mg | DELAYED_RELEASE_TABLET | Freq: Three times a day (TID) | ORAL | Status: DC
  Administered 2024-01-14 – 2024-01-16 (×5): 250 mg via ORAL
  Filled 2024-01-14 (×6): qty 1

## 2024-01-14 MED ORDER — ONDANSETRON HCL 4 MG/2ML IJ SOLN: 4.0000 mg | Freq: Four times a day (QID) | INTRAMUSCULAR | Status: DC | PRN

## 2024-01-14 MED ORDER — INSULIN ASPART 100 UNIT/ML IJ SOLN
0.0000 [IU] | Freq: Three times a day (TID) | INTRAMUSCULAR | Status: DC
  Administered 2024-01-14: 3 [IU] via SUBCUTANEOUS

## 2024-01-14 MED ORDER — POLYETHYLENE GLYCOL 3350 17 G PO PACK: 17.0000 g | PACK | Freq: Every day | ORAL | Status: DC | PRN

## 2024-01-14 MED ORDER — MELATONIN 3 MG PO TABS: 6.0000 mg | ORAL_TABLET | Freq: Every evening | ORAL | Status: DC | PRN

## 2024-01-14 MED ORDER — MEGESTROL ACETATE 40 MG PO TABS
40.0000 mg | ORAL_TABLET | Freq: Every day | ORAL | Status: DC
  Administered 2024-01-15 – 2024-01-18 (×4): 40 mg via ORAL
  Filled 2024-01-14 (×5): qty 1

## 2024-01-14 MED ORDER — INSULIN GLARGINE-YFGN 100 UNIT/ML ~~LOC~~ SOLN: 5.0000 [IU] | Freq: Two times a day (BID) | SUBCUTANEOUS | Status: DC

## 2024-01-14 MED ORDER — POTASSIUM CHLORIDE 10 MEQ/100ML IV SOLN
10.0000 meq | INTRAVENOUS | Status: AC
Start: 2024-01-14 — End: 2024-01-14
  Administered 2024-01-14: 10 meq via INTRAVENOUS
  Filled 2024-01-14: qty 100

## 2024-01-14 MED ORDER — INSULIN GLARGINE-YFGN 100 UNIT/ML ~~LOC~~ SOLN
10.0000 [IU] | Freq: Two times a day (BID) | SUBCUTANEOUS | Status: DC
  Administered 2024-01-14: 10 [IU] via SUBCUTANEOUS
  Filled 2024-01-14 (×3): qty 0.1

## 2024-01-14 MED ORDER — ACETAMINOPHEN 500 MG PO TABS
1000.0000 mg | ORAL_TABLET | Freq: Four times a day (QID) | ORAL | Status: DC | PRN
  Administered 2024-01-16: 1000 mg via ORAL
  Filled 2024-01-14: qty 2

## 2024-01-14 MED ORDER — INSULIN ASPART 100 UNIT/ML IJ SOLN
0.0000 [IU] | INTRAMUSCULAR | Status: DC
  Administered 2024-01-15: 5 [IU] via SUBCUTANEOUS
  Administered 2024-01-15: 2 [IU] via SUBCUTANEOUS
  Administered 2024-01-15: 4 [IU] via SUBCUTANEOUS

## 2024-01-14 MED ORDER — INSULIN GLARGINE-YFGN 100 UNIT/ML ~~LOC~~ SOLN
5.0000 [IU] | Freq: Two times a day (BID) | SUBCUTANEOUS | Status: DC
  Filled 2024-01-14 (×2): qty 0.05

## 2024-01-14 MED ORDER — MIDODRINE HCL 5 MG PO TABS
5.0000 mg | ORAL_TABLET | Freq: Three times a day (TID) | ORAL | Status: DC
  Administered 2024-01-14 – 2024-01-15 (×4): 5 mg via ORAL
  Filled 2024-01-14 (×4): qty 1

## 2024-01-14 MED ORDER — ATORVASTATIN CALCIUM 10 MG PO TABS
20.0000 mg | ORAL_TABLET | Freq: Every day | ORAL | Status: DC
  Administered 2024-01-14 – 2024-01-18 (×5): 20 mg via ORAL
  Filled 2024-01-14 (×5): qty 2

## 2024-01-14 MED ORDER — SODIUM CHLORIDE 0.9 % IV SOLN
2.0000 g | INTRAVENOUS | Status: DC
  Administered 2024-01-14 – 2024-01-16 (×3): 2 g via INTRAVENOUS
  Filled 2024-01-14 (×3): qty 20

## 2024-01-14 MED ORDER — LAMOTRIGINE 100 MG PO TABS
50.0000 mg | ORAL_TABLET | Freq: Two times a day (BID) | ORAL | Status: DC
  Administered 2024-01-14 – 2024-01-18 (×8): 50 mg via ORAL
  Filled 2024-01-14 (×8): qty 1

## 2024-01-14 MED ORDER — HEPARIN SODIUM (PORCINE) 5000 UNIT/ML IJ SOLN
5000.0000 [IU] | Freq: Three times a day (TID) | INTRAMUSCULAR | Status: DC
Start: 2024-01-14 — End: 2024-01-14

## 2024-01-14 MED ORDER — PANTOPRAZOLE SODIUM 40 MG PO TBEC
40.0000 mg | DELAYED_RELEASE_TABLET | Freq: Every day | ORAL | Status: DC
  Administered 2024-01-14 – 2024-01-18 (×5): 40 mg via ORAL
  Filled 2024-01-14 (×5): qty 1

## 2024-01-14 MED ORDER — INSULIN ASPART 100 UNIT/ML IJ SOLN
0.0000 [IU] | Freq: Every day | INTRAMUSCULAR | Status: DC
Start: 2024-01-14 — End: 2024-01-14

## 2024-01-14 MED ORDER — ALBUTEROL SULFATE (2.5 MG/3ML) 0.083% IN NEBU: 2.5000 mg | INHALATION_SOLUTION | RESPIRATORY_TRACT | Status: DC | PRN

## 2024-01-14 MED ORDER — VANCOMYCIN HCL 1.25 G IV SOLR
1250.0000 mg | Freq: Once | INTRAVENOUS | Status: AC
Start: 2024-01-14 — End: 2024-01-14
  Administered 2024-01-14: 1250 mg via INTRAVENOUS
  Filled 2024-01-14: qty 25

## 2024-01-14 MED ORDER — VANCOMYCIN VARIABLE DOSE PER UNSTABLE RENAL FUNCTION (PHARMACIST DOSING): Status: DC

## 2024-01-14 NOTE — ED Notes (Signed)
 Admitting MD aware of current VS

## 2024-01-14 NOTE — Progress Notes (Signed)
 Pharmacy Antibiotic Note  Carolyn Norton is a 58 y.o. female admitted on 01/13/2024 with concern for sepsis.  Pharmacy has been consulted for vancomycin  dosing.  Pt w/ AKI; baseline SCr <1, now 2.56.  Plan: Vancomycin  1250mg  IV x1; monitor SCr +/- vanc levels prior to redosing.  Temp (24hrs), Avg:98.3 F (36.8 C), Min:97.3 F (36.3 C), Max:99.9 F (37.7 C)  Recent Labs  Lab 01/13/24 2239 01/14/24 0003 01/14/24 0034 01/14/24 0400 01/14/24 0416  WBC  --  16.3*  --  14.8*  --   CREATININE  --  3.37* 2.40* 2.56*  --   LATICACIDVEN 5.4*  --   --   --  5.3*    Estimated Creatinine Clearance: 23.5 mL/min (A) (by C-G formula based on SCr of 2.56 mg/dL (H)).    Allergies  Allergen Reactions   Erythromycin Other (See Comments)    Childhood reaction    Thank you for allowing pharmacy to be a part of this patient's care.  Lonnie Roberts, PharmD, BCPS  01/14/2024 5:58 AM

## 2024-01-14 NOTE — ED Notes (Signed)
 PT is now able to follow commands and give a thumbs up to questions

## 2024-01-14 NOTE — Plan of Care (Signed)
   Problem: Education: Goal: Ability to describe self-care measures that may prevent or decrease complications (Diabetes Survival Skills Education) will improve Outcome: Progressing

## 2024-01-14 NOTE — ED Notes (Signed)
 Patient cleaned up and new linens placed, patient is very red with dry scale skin in groin and buttuck area, washed and dried  barrier cream applied sacral   patch applied.

## 2024-01-14 NOTE — Progress Notes (Signed)
 [facility] PROGRESS NOTE    Progress Note  Carolyn Norton  WUJ:[id number] DOB: 07-Oct-1965 DOA: 01/13/2024 PCP: Dickey Fought, PA     Brief Narrative:   Carolyn Norton is an 58 y.o. female past medical history diabetes mellitus type 2 noncompliant with her medication, endometrial cancer who has not yet undergone staging plan for radiation, normal pressure hydrocephalus with a shunt, hyperlipidemia brought in from skilled nursing facility for confusion was found to be in DKA.  Assessment/Plan:   DKA (diabetic ketoacidosis) (HCC) With a history of nonadherence of her insulin  at her facility. Blood glucose was greater than 600, bicarb of 17. She was started on IV insulin  and IV IV fluids. Basic metabolic panel Q4 CBGs every hour. Blood glucose this morning is 179, continue D5 until bicarb greater than 20. N.p.o. for now.  SIRS in the setting of DKA: Lactic acid elevation likely due to DKA, and infectious etiology evaluation unrevealing at this point in time. Blood cultures have been sent, urinalysis showed no evidence of infection. She was started empirically on vancomycin  and Rocephin . Started on low-dose midodrine, blood pressure is trending up.  Acute kidney injury [facility]): Renal ultrasound showed cortical thinning. With a baseline creatinine of 0.8 on admission 2.4. Likely prerenal in the setting of DKA. Continue IV fluids.  Acute respiratory failure with hypoxia: Per EMS initially 83% on room air was placed on nonrebreather then transition to BiPAP due to worsening shortness of breath in the ED. There is likely due to her acidosis she has now been weaned to room air. Chest showed no acute findings.  Acute metabolic encephalopathy: Likely due to DKA plus or minus infectious etiology and acute kidney injury. She is slow to respond but able to talk to me this morning. She relates she is tired she does not want to be bothered she wants to have the interview and  further conversation later on or tomorrow.  Erythrocytosis: Likely hemoconcentration in the setting of dehydration now improving. Her baseline hemoglobin is back around 12.  Repeated this morning is 12.9.  History of endometrial cancer: Continue follow-up with GYN as an outpatient. Patient has not undergone staging for surgical intervention as she is a poor surgical candidate. She is planned for radiation as an outpatient.  History of normal pressure hydrocephalus with shunt: Noted.  Essential hypertension: Was recently taken off her antihypertensive medication.  Hyperlipidemia:  continue statins.  Bipolar disorder: Continue lamotrigine  and Depakote .  DVT prophylaxis: lovenox  Family Communication:none Status is: Inpatient Remains inpatient appropriate because: DKA    Code Status:     Code Status Orders  (From admission, onward)           Start     Ordered   01/14/24 0225  Full code  Continuous       Question:  By:  Answer:  Other   01/14/24 0227           Code Status History     Date Active Date Inactive Code Status Order ID Comments User Context   12/23/2023 1333 12/27/2023 2114 Full Code [id number]  Arlys Lamer ED   10/30/2023 5784 11/05/2023 1933 Full Code [id number]  Josiah Nigh, MD ED   05/29/2022 1653 05/30/2022 2100 Full Code [id number]  Gearl Keens, MD Inpatient   02/24/2022 1501 02/26/2022 1709 Full Code [id number]  [name] D, DO ED   01/31/2021 1622 04/30/2021 1759 DNR [id number]  Shellye Dibble, NP Inpatient   01/17/2021 [phone] 01/31/2021 1622 Full  Code [id number]  [name], MD Inpatient   01/12/2021 1348 01/17/2021 0958 DNR [id number]  [name], MD Inpatient   01/11/2021 2313 01/12/2021 1348 Full Code [id number]  Bary Boss, DO ED   10/22/2020 1853 10/27/2020 1824 Full Code [id number]  [name], DO ED   09/06/2020 1710 09/09/2020 2034 Full Code [id number]  [name], MD ED         IV Access:   Peripheral  IV   Procedures and diagnostic studies:   US  RENAL Result Date: 01/14/2024 CLINICAL DATA:  Acute kidney injury EXAM: RENAL / URINARY TRACT ULTRASOUND COMPLETE COMPARISON:  None Available. FINDINGS: Right Kidney: Renal measurements: 9.2 x 4.2 x 4.1 cm = volume: 82 mL. Somewhat small volume but no renal cortical thinning. Echogenicity within normal limits. No mass or hydronephrosis visualized. Left Kidney: Renal measurements: 9.6 x 4.5 x 4.5 cm = volume: 102 mL. Echogenicity within normal limits. No mass or hydronephrosis visualized. Bladder: Appears normal for degree of bladder distention. IMPRESSION: No hydronephrosis, mass, or cortical thinning. Electronically Signed   By: Ronnette Coke M.D.   On: 01/14/2024 05:24   DG Chest Port 1 View Result Date: 01/13/2024 CLINICAL DATA:  Shortness of breath and hyperglycemia, altered mental status EXAM: PORTABLE CHEST 1 VIEW COMPARISON:  12/23/2023 FINDINGS: Stable cardiomediastinal silhouette. Aortic atherosclerotic calcification. Left basilar and right mid lung atelectasis. Low lung volumes accentuate pulmonary vascularity. No pleural effusion or pneumothorax. Partially visualized right chest VP shunt. IMPRESSION: Low lung volumes with bilateral atelectasis. Electronically Signed   By: Rozell Cornet M.D.   On: 01/13/2024 22:40     Medical Consultants:   None.   Subjective:    Carolyn Norton no complaints  Objective:    Vitals:   01/14/24 0415 01/14/24 0430 01/14/24 0530 01/14/24 0726  BP: 106/84 (!) 93/45 (!) 86/52 (!) 104/54  Pulse: (!) 115 (!) 106 (!) 102 (!) 101  Resp: (!) 22 (!) 26 (!) 22 17  Temp:    98 F (36.7 C)  TempSrc:    Oral  SpO2: 100% 100% 100% 100%   SpO2: 100 % O2 Flow Rate (L/min): 3 L/min FiO2 (%): 28 %   Intake/Output Summary (Last 24 hours) at 01/14/2024 0740 Last data filed at 01/14/2024 0143 Gross per 24 hour  Intake 999 ml  Output --  Net 999 ml   There were no vitals filed for this  visit.  Exam: General exam: In no acute distress. Respiratory system: Good air movement and clear to auscultation. Cardiovascular system: S1 & S2 heard, RRR. No JVD. Gastrointestinal system: Abdomen is nondistended, soft and nontender.  Extremities: No pedal edema. Skin: No rashes, lesions or ulcers Psychiatry: Judgement and insight appear normal. Mood & affect appropriate.    Data Reviewed:    Labs: Basic Metabolic Panel: Recent Labs  Lab 01/14/24 0003 01/14/24 0034 01/14/24 0313 01/14/24 0400  NA 141 137 144 142  K 4.5 4.5 3.7 3.8  CL 96* 104  --  103  CO2 <7*  --   --  14*  GLUCOSE 567* 583*  --  299*  BUN 30* 29*  --  27*  CREATININE 3.37* 2.40*  --  2.56*  CALCIUM  9.3  --   --  9.3  MG  --   --   --  1.8  PHOS  --   --   --  2.4*   GFR Estimated Creatinine Clearance: 23.5 mL/min (A) (by C-G formula based on SCr  of 2.56 mg/dL (H)). Liver Function Tests: Recent Labs  Lab 01/14/24 0400  AST 23  ALT 13  ALKPHOS 55  BILITOT 2.2*  PROT 6.0*  ALBUMIN 2.8*   No results for input(s): "LIPASE", "AMYLASE" in the last 168 hours. No results for input(s): "AMMONIA" in the last 168 hours. Coagulation profile No results for input(s): "INR", "PROTIME" in the last 168 hours. COVID-19 Labs  No results for input(s): "DDIMER", "FERRITIN", "LDH", "CRP" in the last 72 hours.  Lab Results  Component Value Date   SARSCOV2NAA NEGATIVE 12/23/2023   SARSCOV2NAA NEGATIVE 10/30/2023   SARSCOV2NAA NEGATIVE 04/29/2021   SARSCOV2NAA NEGATIVE 01/11/2021    CBC: Recent Labs  Lab 01/14/24 0003 01/14/24 0034 01/14/24 0313 01/14/24 0400  WBC 16.3*  --   --  14.8*  NEUTROABS 13.1*  --   --   --   HGB 14.6 16.0* 7.1* 12.9  HCT 48.8* 47.0* 21.0* 40.4  MCV 96.1  --   --  89.6  PLT 357  --   --  276   Cardiac Enzymes: No results for input(s): "CKTOTAL", "CKMB", "CKMBINDEX", "TROPONINI" in the last 168 hours. BNP (last 3 results) No results for input(s): "PROBNP" in the last  8760 hours. CBG: Recent Labs  Lab 01/14/24 0316 01/14/24 0418 01/14/24 0511 01/14/24 0612 01/14/24 0725  GLUCAP 325* 272* 257* 170* 179*   D-Dimer: No results for input(s): "DDIMER" in the last 72 hours. Hgb A1c: No results for input(s): "HGBA1C" in the last 72 hours. Lipid Profile: No results for input(s): "CHOL", "HDL", "LDLCALC", "TRIG", "CHOLHDL", "LDLDIRECT" in the last 72 hours. Thyroid function studies: No results for input(s): "TSH", "T4TOTAL", "T3FREE", "THYROIDAB" in the last 72 hours.  Invalid input(s): "FREET3" Anemia work up: No results for input(s): "VITAMINB12", "FOLATE", "FERRITIN", "TIBC", "IRON", "RETICCTPCT" in the last 72 hours. Sepsis Labs: Recent Labs  Lab 01/13/24 2239 01/14/24 0003 01/14/24 0400 01/14/24 0416  WBC  --  16.3* 14.8*  --   LATICACIDVEN 5.4*  --   --  5.3*   Microbiology No results found for this or any previous visit (from the past 240 hours).   Medications:    atorvastatin   20 mg Oral Daily   divalproex   250 mg Oral TID   lamoTRIgine   50 mg Oral BID   megestrol   40 mg Oral Daily   midodrine  5 mg Oral TID WC   pantoprazole   40 mg Oral Daily   sodium chloride  flush  3 mL Intravenous Q12H   vancomycin  variable dose per unstable renal function (pharmacist dosing)   Does not apply See admin instructions   Continuous Infusions:  cefTRIAXone  (ROCEPHIN )  IV Stopped (01/14/24 0554)   dextrose  5% lactated ringers  125 mL/hr at 01/14/24 0651   insulin  2.2 Units/hr (01/14/24 0729)   lactated ringers  Stopped (01/14/24 0650)   vancomycin  1,250 mg (01/14/24 0659)      LOS: 0 days   Macdonald Savoy  [facility]  01/14/2024, 7:40 AM

## 2024-01-14 NOTE — H&P (Signed)
 History and Physical    Carolyn Norton:[id number] DOB: 07/23/1966 DOA: 01/13/2024  PCP: Carolyn Fought, PA   Patient coming from: [facility]    Chief Complaint:  Chief Complaint  Patient presents with   Hyperglycemia   Altered Mental Status    HPI: History limited due to AMS Carolyn Norton is a 58 y.o. female with hx of DM type 2, uncontrolled with medication non adherence and resultant episodes of DKA, endometrial cancer not yet undergone staging surgery, planned for radiation, NPH with shunt, HTN, HLD, who is brought in from nursing home due to altered mental status.  Patient is awake and alert but unable to provide any significant history.  Denies receiving insulin  at nursing home but unable to elaborate further.  Has no active complaints.   Review of Systems:  ROS limited due to altered mental status  Allergies  Allergen Reactions   Erythromycin Other (See Comments)    Childhood reaction    Prior to Admission medications   Medication Sig Start Date End Date Taking? Authorizing Provider  albuterol  (VENTOLIN  HFA) 108 (90 Base) MCG/ACT inhaler Inhale 2 puffs into the lungs See admin instructions. 2 puffs every 4-6 hours as needed for wheezing and SOB    [provider]  atorvastatin  (LIPITOR) 20 MG tablet Take 1 tablet (20 mg total) by mouth daily. 04/29/21   Carolyn Rubenstein, NP  Cholecalciferol  25 MCG (1000 UT) tablet Take 1,000 Units by mouth daily.    [provider]  dapagliflozin propanediol (FARXIGA) 5 MG TABS tablet Take 5 mg by mouth in the morning and at bedtime. 11/18/23   [provider]  divalproex  (DEPAKOTE ) 250 MG DR tablet Take 250 mg by mouth 3 (three) times daily.    [provider]  docusate sodium  (COLACE) 100 MG capsule Take 1 capsule (100 mg total) by mouth 2 (two) times daily as needed for mild constipation. 12/27/23   Barbee Lew, MD  guaiFENesin (ROBITUSSIN) 100 MG/5ML liquid Take 5 mLs by  mouth every 4 (four) hours as needed for cough or to loosen phlegm.    [provider]  insulin  aspart (NOVOLOG ) 100 UNIT/ML injection Inject 0-20 Units into the skin 3 (three) times daily with meals. CBG 70 - 120: 0 units CBG 121 - 150: 0 units CBG 151 - 200: 0 units CBG 201 - 250: 2 units CBG 251 - 300: 3 units CBG 301 - 350: 4 units CBG 351 - 400: 5 units CBG > 400: call MD and obtain STAT lab verification Patient taking differently: Inject 2-4 Units into the skin 3 (three) times daily with meals. BS 201-250 inject 2 units  BS 251-300 inject 3 units  BS 301-350 inject 4 units  BS 315-400 inject 4 units 11/05/23   Norton, Carolyn Agatha, MD  insulin  glargine-yfgn (SEMGLEE ) 100 UNIT/ML injection Inject 0.04 mLs (4 Units total) into the skin 2 (two) times daily. 12/27/23   Barbee Lew, MD  lamoTRIgine  (LAMICTAL ) 25 MG tablet Take 50 mg by mouth 2 (two) times daily. 12/01/23   [provider]  megestrol  (MEGACE ) 20 MG tablet Take 40 mg by mouth daily.    [provider]  Multiple Vitamins-Minerals (MULTIVITAMIN WITH MINERALS) tablet Take 1 tablet by mouth daily.    [provider]  ondansetron  (ZOFRAN ) 4 MG tablet Take 4 mg by mouth every 6 (six) hours as needed for nausea or vomiting.    [provider]  pantoprazole  (PROTONIX ) 40 MG  tablet Take 40 mg by mouth daily.    [provider]  polyethylene glycol (MIRALAX  / GLYCOLAX ) 17 g packet Take 17 g by mouth daily. Patient taking differently: Take 17 g by mouth daily as needed for moderate constipation. 04/30/21   Carolyn Rubenstein, NP    Past Medical History:  Diagnosis Date   Alzheimer disease [facility])    from facility diagnosis, 12/10/22- orientation has improved since she had shunt placed.   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   COVID    History of   Depression    Diabetes mellitus without complication  (HCC)    Type 2 (type 1 per patient and aunt)   Dyspnea    Hyperlipidemia    Hypertension    Normal pressure hydrocephalus (HCC)    Seasonal allergies     Past Surgical History:  Procedure Laterality Date   BREAST EXCISIONAL BIOPSY Right    BREAST SURGERY     right breast   CATARACT EXTRACTION W/PHACO Left 02/12/2023   Procedure: CATARACT EXTRACTION PHACO AND INTRAOCULAR LENS PLACEMENT (IOC);  Surgeon: Carolyn Farm, MD;  Location: [facility];  Service: Ophthalmology;  Laterality: Left;  CDE: 22.05   CATARACT EXTRACTION W/PHACO Right 02/26/2023   Procedure: CATARACT EXTRACTION PHACO AND INTRAOCULAR LENS PLACEMENT (IOC);  Surgeon: Carolyn Farm, MD;  Location: [facility];  Service: Ophthalmology;  Laterality: Right;  CDE: 24.40   TOOTH EXTRACTION N/A 12/11/2022   Procedure: DENTAL RESTORATION/EXTRACTIONS;  Surgeon: Carolyn Norton, DMD;  Location: [facility] OR;  Service: Oral Surgery;  Laterality: N/A;   VENTRICULOPERITONEAL SHUNT N/A 05/29/2022   Procedure: Ventriculoperitoneal shunt placement;  Surgeon: Carolyn Keens, MD;  Location: [facility] OR;  Service: Neurosurgery;  Laterality: N/A;     reports that she has been smoking cigarettes. She started smoking about a year ago. She has a 0.5 pack-year smoking history. She has never used smokeless tobacco. She reports that she does not currently use drugs after having used the following drugs: Cocaine. She reports that she does not drink alcohol.  Family History  Problem Relation Age of Onset   Hypertension Mother    Kidney cancer Mother    Prostate cancer Father    Leukemia Brother    Breast cancer Paternal Grandmother    Endometrial cancer Neg Hx    Colon cancer Neg Hx    Ovarian cancer Neg Hx      Physical Exam: Vitals:   01/13/24 2241 01/14/24 0015 01/14/24 0030 01/14/24 0324  BP:  114/68 (!) 107/94   Pulse:   (!) 144 (!) 115  Resp:  (!) 22 (!) 23 (!) 22  Temp: 99.9 F (37.7 C)   97.6 F (36.4 C)  TempSrc: Axillary   Oral  SpO2:   (!) 89%      Gen: Awake, alert, acutely and chronically ill-appearing CV: Regular, tachycardic, normal S1, S2, no murmurs  Resp: Tachypneic, coo small respirations, on BiPAP, CTAB  Abd: Flat, normoactive, nontender MSK: Symmetric, no edema  Skin: Extremities are cool peripherally.  No rashes or lesions to exposed skin  Neuro: Alert and interactive, does not answer orientation questions.  Moving all extremities. Psych: Unable to assess, appears encephalopathic   Data review:   Labs reviewed, notable for:   On initial evaluation CBG greater than 600, VBG per ED pH 7.1 (result misplaced and not carried into system), bicarb  less than 7, anion gap incalculable, beta hydroxybutyrate greater than 8, lactate 5.4  Hemoglobin likely hemoconcentrated 14, dropped to 7 on a i-STAT suspect erroneous will need repeat  WBC 16, neutrophilic, left shift  Micro:  Results for orders placed or performed during the hospital encounter of 12/23/23  Culture, blood (routine x 2)     Status: None   Collection Time: 12/23/23 12:13 PM   Specimen: BLOOD  Result Value Ref Range Status   Specimen Description   Final    BLOOD LEFT ANTECUBITAL Performed at [facility], [address], [place], [place] [address]    Special Requests   Final    BOTTLES DRAWN AEROBIC AND ANAEROBIC Blood Culture adequate volume Performed at [facility], [address], [place], [place] [address]    Culture   Final    NO GROWTH 5 DAYS Performed at [facility], [address] [place], [place] [address]    Report Status 12/28/2023 FINAL  Final  Culture, blood (routine x 2)     Status: None   Collection Time: 12/23/23 12:18 PM   Specimen: BLOOD LEFT HAND  Result Value Ref Range Status   Specimen Description   Final    BLOOD LEFT HAND Performed at [facility], [address], [place], [place] [address]    Special Requests   Final    BOTTLES DRAWN AEROBIC AND ANAEROBIC Blood Culture  results may not be optimal due to an excessive volume of blood received in culture bottles Performed at [facility], [address] [place], [place] [address]    Culture   Final    NO GROWTH 5 DAYS Performed at [facility], [address], [place], [place] [address]    Report Status 12/28/2023 FINAL  Final  Urine Culture (for pregnant, neutropenic or urologic patients or patients with an indwelling urinary catheter)     Status: Abnormal   Collection Time: 12/23/23  1:17 PM   Specimen: Urine, Catheterized  Result Value Ref Range Status   Specimen Description   Final    URINE, CATHETERIZED Performed at [facility], [address], [place], [place] [address]    Special Requests   Final    NONE Performed at [facility], [address], [place], [place] [address]    Culture (A)  Final    <10,000 COLONIES/mL INSIGNIFICANT GROWTH Performed at [facility], [address], [place], [place] [address]    Report Status 12/24/2023 FINAL  Final  Resp panel by RT-PCR (RSV, Flu A&B, Covid) Anterior Nasal Swab     Status: None   Collection Time: 12/23/23  7:01 PM   Specimen: Anterior Nasal Swab  Result Value Ref Range Status   SARS Coronavirus 2 by RT PCR NEGATIVE NEGATIVE Final    Comment: (NOTE) SARS-CoV-2 target nucleic acids are NOT DETECTED.  The SARS-CoV-2 RNA is generally detectable in upper respiratory specimens during the acute phase of infection. The lowest concentration of SARS-CoV-2 viral copies this assay can detect is 138 copies/mL. A negative result does not preclude SARS-Cov-2 infection and should not be used as the sole basis for treatment or other patient management decisions. A negative result may occur with  improper specimen collection/handling, submission of specimen other than nasopharyngeal swab, presence of viral mutation(s) within the areas targeted by this assay, and inadequate number of  viral copies(<138 copies/mL). A negative result must be combined  with clinical observations, patient history, and epidemiological information. The expected result is Negative.  Fact Sheet for Patients:  BloggerCourse.com  Fact Sheet for Healthcare Providers:  SeriousBroker.it  This test is no t yet approved or cleared by the [place]  FDA and  has been authorized for detection and/or diagnosis of SARS-CoV-2 by FDA under an Emergency Use Authorization (EUA). This EUA will remain  in effect (meaning this test can be used) for the duration of the COVID-19 declaration under Section 564(b)(1) of the Act, 21 U.S.C.section 360bbb-3(b)(1), unless the authorization is terminated  or revoked sooner.       Influenza A by PCR NEGATIVE NEGATIVE Final   Influenza B by PCR NEGATIVE NEGATIVE Final    Comment: (NOTE) The Xpert Xpress SARS-CoV-2/FLU/RSV plus assay is intended as an aid in the diagnosis of influenza from Nasopharyngeal swab specimens and should not be used as a sole basis for treatment. Nasal washings and aspirates are unacceptable for Xpert Xpress SARS-CoV-2/FLU/RSV testing.  Fact Sheet for Patients: BloggerCourse.com  Fact Sheet for Healthcare Providers: SeriousBroker.it  This test is not yet approved or cleared by the [place]  FDA and has been authorized for detection and/or diagnosis of SARS-CoV-2 by FDA under an Emergency Use Authorization (EUA). This EUA will remain in effect (meaning this test can be used) for the duration of the COVID-19 declaration under Section 564(b)(1) of the Act, 21 U.S.C. section 360bbb-3(b)(1), unless the authorization is terminated or revoked.     Resp Syncytial Virus by PCR NEGATIVE NEGATIVE Final    Comment: (NOTE) Fact Sheet for Patients: BloggerCourse.com  Fact Sheet for Healthcare  Providers: SeriousBroker.it  This test is not yet approved or cleared by the [place]  FDA and has been authorized for detection and/or diagnosis of SARS-CoV-2 by FDA under an Emergency Use Authorization (EUA). This EUA will remain in effect (meaning this test can be used) for the duration of the COVID-19 declaration under Section 564(b)(1) of the Act, 21 U.S.C. section 360bbb-3(b)(1), unless the authorization is terminated or revoked.  Performed at [facility], [address] [place], [place] [address]   MRSA Next Gen by PCR, Nasal     Status: None   Collection Time: 12/23/23  8:31 PM   Specimen: Nasal Mucosa; Nasal Swab  Result Value Ref Range Status   MRSA by PCR Next Gen NOT DETECTED NOT DETECTED Final    Comment: (NOTE) The GeneXpert MRSA Assay (FDA approved for NASAL specimens only), is one component of a comprehensive MRSA colonization surveillance program. It is not intended to diagnose MRSA infection nor to guide or monitor treatment for MRSA infections. Test performance is not FDA approved in patients less than 79 years old. Performed at [facility], [address], [place], [place] [address]     Imaging reviewed:  [facility] Chest Port 1 View Result Date: 01/13/2024 CLINICAL DATA:  Shortness of breath and hyperglycemia, altered mental status EXAM: PORTABLE CHEST 1 VIEW COMPARISON:  12/23/2023 FINDINGS: Stable cardiomediastinal silhouette. Aortic atherosclerotic calcification. Left basilar and right mid lung atelectasis. Low lung volumes accentuate pulmonary vascularity. No pleural effusion or pneumothorax. Partially visualized right chest VP shunt. IMPRESSION: Low lung volumes with bilateral atelectasis. Electronically Signed   By: Rozell Cornet M.D.   On: 01/13/2024 22:40    EKG:  Personally reviewed, sinus tachycardia 146, LAD, PRWP, RSR prime in V1 V2, ST depression laterally  EMS/ED Course:  On  initial EMS assessment hypoxic to 83% on room air was placed  on a nonrebreather.  In the ED was placed on BiPAP for work of breathing.  Found have DKA, treated with 2.4 L IV fluid followed by maintenance rate, insulin  drip.  Due to severity of her illness EDP consulted with PCCM Dr. Villa Greaser who feels she is appropriate for progressive level of care.   Assessment/Plan:  58 y.o. female with hx DM type 2, uncontrolled with medication non adherence and resultant episodes of DKA, endometrial cancer not yet undergone staging surgery, planned for radiation, NPH with shunt, HTN, HLD, who is brought in from nursing home due to altered mental status.  Found to be in DKA  DKA, moderate to severe History of prior episodes of DKA in the setting of nonadherence with insulin  at her nursing facility. Home regimen is Semglee  8 U BID, and aspart SSI, Farxiga.  On initial evaluation CBG greater than 600, VBG per ED pH 7.1 (result misplaced and not carried into system), bicarb less than 7, anion gap incalculable, beta hydroxybutyrate greater than 8, lactate 5.4.  Repeat VBG improved 7.3 / 15. Lonne Roan of this episode suspected continued nonadherence with insulin  therapy but will need to verify.  Does have leukocytosis to 16 and worsening blood pressure trend despite IV fluid resuscitation possible underlying infection.  - Insulin  drip, DKA protocol - S/p 2.4 L IV fluid, continue maintenance fluid for DKA - BMP q4 hr, Beta OH butyrate every 8 hour - K repletion  - DM educator c/s  - N.p.o. except water  -Considering her recurrent DKA Elvina Hammers should be discontinued  SIRS likely in the setting of DKA v. Sepsis  Developing hypotension, suspect mixed hypovolemic possibly septic Lactic acidosis On initial evaluation tachycardic in the 140s, tachypneic, despite IV fluid BP trend worsening to 80s over 50s, MAP greater than 65.  WBC 16, neutrophilic with a left shift.  Lactate 5.4, repeat pending. infectious eval thus far  unrevealing, and no localizing symptoms or signs on exam - Repeat lactate pending, consider additional IV fluid bolus (cautious with AKI)  - Check blood cultures, In- and-Out cath for UA / Ucx  - After blood cultures/urine done will start on vancomycin  pharmacy to dose and ceftriaxone  - Start on midodrine 5 mg 3 times daily  Acute kidney injury stage III Baseline creatinine 0.8, elevated to 2.4 on admission.  Likely prerenal in the setting of DKA.  She does have a history of endometrial cancer not yet treated would be at risk of potential postobstructive sources. - IV fluids per above - Renal ultrasound  AHRF / respiratory distress  Per EMS initially 83% on RA, was placed on NRB -> BiPAP for WOB in ED. As acidosis is metabolic and WOB improved will transition off BiPAP  -Transition off BiPAP to nasal cannula  Encephalopathy, metabolic in setting of above problems - Management directed at DKA, possible developing sepsis, AKI  Question developing anemia  Suspect lab error, initially hemoconcentrated with Hb 14 -> 7 on istat gas.  - Repeat CBC  Chronic medical problems: History of endometrial cancer: Follows with Gyn Onc, imaging with myometrial invasion.  Has not undergone surgical staging as she is a poor surgical candidate.  She is planned for radiation therapy History of NPH with shunt: Noted Hypertension: Recently taken off antihypertensives Hyperlipidemia: Continue home Atorvastatin    Bipolar d/o: Continue home lamotrigine , Depakote   There is no height or weight on file to calculate BMI.    DVT prophylaxis:  SCDs Code Status:  Full Code; presumed full  Diet:  Diet Orders (  From admission, onward)     Start     Ordered   01/14/24 0226  Diet NPO time specified  Diet effective now       Comments: Water  OK once off BiPAP   01/14/24 0227           Family Communication:  No   Consults:  None   Admission status:   Inpatient, Step Down Unit  Severity of Illness: The  appropriate patient status for this patient is INPATIENT. Inpatient status is judged to be reasonable and necessary in order to provide the required intensity of service to ensure the patient's safety. The patient's presenting symptoms, physical exam findings, and initial radiographic and laboratory data in the context of their chronic comorbidities is felt to place them at high risk for further clinical deterioration. Furthermore, it is not anticipated that the patient will be medically stable for discharge from the hospital within 2 midnights of admission.   * I certify that at the point of admission it is my clinical judgment that the patient will require inpatient hospital care spanning beyond 2 midnights from the point of admission due to high intensity of service, high risk for further deterioration and high frequency of surveillance required.*   Arnulfo Larch, MD [facility]  How to contact the [facility] Attending or Consulting provider 7A - 7P or covering provider during after hours 7P -7A, for this patient.  Check the care team in [facility] and look for a) attending/consulting [facility] provider listed and b) the [facility] team listed Log into [email or web address] and use [facility]'s universal password to access. If you do not have the password, please contact the hospital operator. Locate the [facility] provider you are looking for under [facility] and page to a number that you can be directly reached. If you still have difficulty reaching the provider, please page the [facility] (Director on Call) for the Hospitalists listed on amion for assistance.  01/14/2024, 3:30 AM

## 2024-01-15 ENCOUNTER — Inpatient Hospital Stay (HOSPITAL_COMMUNITY)

## 2024-01-15 DIAGNOSIS — M7989 Other specified soft tissue disorders: Secondary | ICD-10-CM

## 2024-01-15 DIAGNOSIS — G9340 Encephalopathy, unspecified: Secondary | ICD-10-CM | POA: Diagnosis not present

## 2024-01-15 DIAGNOSIS — J9601 Acute respiratory failure with hypoxia: Secondary | ICD-10-CM | POA: Diagnosis not present

## 2024-01-15 DIAGNOSIS — N179 Acute kidney failure, unspecified: Secondary | ICD-10-CM | POA: Diagnosis not present

## 2024-01-15 DIAGNOSIS — E1110 Type 2 diabetes mellitus with ketoacidosis without coma: Secondary | ICD-10-CM | POA: Diagnosis not present

## 2024-01-15 LAB — BASIC METABOLIC PANEL WITH GFR
Anion gap: 12 (ref 5–15)
BUN: 17 mg/dL (ref 6–20)
CO2: 24 mmol/L (ref 22–32)
Calcium: 8.7 mg/dL — ABNORMAL LOW (ref 8.9–10.3)
Chloride: 108 mmol/L (ref 98–111)
Creatinine, Ser: 1.21 mg/dL — ABNORMAL HIGH (ref 0.44–1.00)
GFR, Estimated: 53 mL/min — ABNORMAL LOW (ref >60)
Glucose, Bld: 147 mg/dL — ABNORMAL HIGH (ref 70–99)
Potassium: 3.5 mmol/L (ref 3.5–5.1)
Sodium: 144 mmol/L (ref 135–145)

## 2024-01-15 LAB — PROCALCITONIN: Procalcitonin: 0.3 ng/mL

## 2024-01-15 LAB — GLUCOSE, CAPILLARY
Glucose-Capillary: 136 mg/dL — ABNORMAL HIGH (ref 70–99)
Glucose-Capillary: 139 mg/dL — ABNORMAL HIGH (ref 70–99)
Glucose-Capillary: 141 mg/dL — ABNORMAL HIGH (ref 70–99)
Glucose-Capillary: 197 mg/dL — ABNORMAL HIGH (ref 70–99)
Glucose-Capillary: 203 mg/dL — ABNORMAL HIGH (ref 70–99)
Glucose-Capillary: 291 mg/dL — ABNORMAL HIGH (ref 70–99)

## 2024-01-15 LAB — CBC
HCT: 40.4 % (ref 36.0–46.0)
Hemoglobin: 12.8 g/dL (ref 12.0–15.0)
MCH: 28.5 pg (ref 26.0–34.0)
MCHC: 31.7 g/dL (ref 30.0–36.0)
MCV: 90 fL (ref 80.0–100.0)
Platelets: 249 10*3/uL (ref 150–400)
RBC: 4.49 MIL/uL (ref 3.87–5.11)
RDW: 17.9 % — ABNORMAL HIGH (ref 11.5–15.5)
WBC: 12.6 10*3/uL — ABNORMAL HIGH (ref 4.0–10.5)
nRBC: 0 % (ref 0.0–0.2)

## 2024-01-15 MED ORDER — INSULIN ASPART 100 UNIT/ML IJ SOLN
4.0000 [IU] | Freq: Three times a day (TID) | INTRAMUSCULAR | Status: DC
  Administered 2024-01-15 – 2024-01-18 (×5): 4 [IU] via SUBCUTANEOUS

## 2024-01-15 MED ORDER — INSULIN GLARGINE-YFGN 100 UNIT/ML ~~LOC~~ SOLN
10.0000 [IU] | Freq: Two times a day (BID) | SUBCUTANEOUS | Status: DC
Start: 2024-01-15 — End: 2024-01-15

## 2024-01-15 MED ORDER — INSULIN ASPART 100 UNIT/ML IJ SOLN
0.0000 [IU] | Freq: Three times a day (TID) | INTRAMUSCULAR | Status: DC
  Administered 2024-01-15: 5 [IU] via SUBCUTANEOUS
  Administered 2024-01-15: 2 [IU] via SUBCUTANEOUS
  Administered 2024-01-16 (×2): 3 [IU] via SUBCUTANEOUS
  Administered 2024-01-17: 5 [IU] via SUBCUTANEOUS
  Administered 2024-01-17 – 2024-01-18 (×2): 3 [IU] via SUBCUTANEOUS

## 2024-01-15 MED ORDER — INSULIN ASPART 100 UNIT/ML IJ SOLN
0.0000 [IU] | Freq: Every day | INTRAMUSCULAR | Status: DC
  Administered 2024-01-17: 2 [IU] via SUBCUTANEOUS

## 2024-01-15 MED ORDER — MIDODRINE HCL 5 MG PO TABS
2.5000 mg | ORAL_TABLET | Freq: Three times a day (TID) | ORAL | Status: DC
  Administered 2024-01-15 – 2024-01-16 (×3): 2.5 mg via ORAL
  Filled 2024-01-15 (×2): qty 1

## 2024-01-15 MED ORDER — SODIUM CHLORIDE 0.9 % IV SOLN: INTRAVENOUS | Status: AC

## 2024-01-15 MED ORDER — INSULIN GLARGINE-YFGN 100 UNIT/ML ~~LOC~~ SOLN
10.0000 [IU] | Freq: Two times a day (BID) | SUBCUTANEOUS | Status: DC
  Administered 2024-01-15 – 2024-01-18 (×8): 10 [IU] via SUBCUTANEOUS
  Filled 2024-01-15 (×9): qty 0.1

## 2024-01-15 MED ORDER — SODIUM CHLORIDE 0.9 % IV SOLN
100.0000 mg | Freq: Two times a day (BID) | INTRAVENOUS | Status: DC
  Administered 2024-01-15 – 2024-01-16 (×2): 100 mg via INTRAVENOUS
  Filled 2024-01-15 (×3): qty 100

## 2024-01-15 MED ORDER — GERHARDT'S BUTT CREAM
TOPICAL_CREAM | Freq: Three times a day (TID) | CUTANEOUS | Status: DC
  Administered 2024-01-15 – 2024-01-16 (×2): 1 via TOPICAL
  Filled 2024-01-15: qty 60

## 2024-01-15 NOTE — Progress Notes (Signed)
 [facility] PROGRESS NOTE    Progress Note  Carolyn Norton  ZOX:[id number] DOB: November 21, 1965 DOA: 01/13/2024 PCP: Dickey Fought, PA     Brief Narrative:   Carolyn Norton is an 58 y.o. female past medical history diabetes mellitus type 2 noncompliant with her medication, endometrial cancer who has not yet undergone staging plan for radiation, normal pressure hydrocephalus with a shunt, hyperlipidemia brought in from skilled nursing facility for confusion was found to be in DKA.  Assessment/Plan:   DKA (diabetic ketoacidosis) (HCC) With a history of nonadherence of her insulin  at her facility. She was started on IV insulin  and IV IV fluids. Blood glucose improved now long-acting and sliding scale blood glucose ranging from 197-139. Continue current regimen.  SIRS in the setting of DKA: Lactic acid elevation likely due to DKA, and infectious etiology evaluation unrevealing at this point in time. Has remained afebrile leukocytosis improving, check a procalcitonin. Blood cultures negative till date urinalysis showed no evidence of infection. Chest x-ray showed no evidence of infiltrates. Continue IV vancomycin  and Rocephin . Titrate midodrine as her blood pressure has remained stable. At home she is on no antihypertensive medication we will continue to wean off.  Acute kidney injury [facility]): Renal ultrasound showed cortical thinning. With a baseline creatinine of 0.8 on admission 2.4. Started on IV fluids her creatinine is improving. Continue IV fluids for an additional 24 hours. Basic metabolic panels pending this morning.  Acute respiratory failure with hypoxia: Per EMS initially 83%, now resolved she has been weaned to room air. There is likely due to her acidosis she has now been weaned to room air.  Acute metabolic encephalopathy: Likely due to DKA now resolved patient able to carry on a conversation.  Erythrocytosis: Likely hemoconcentration in the setting of  dehydration now improving. Her baseline hemoglobin is back around 12.  Repeated this morning is 12.9.  History of endometrial cancer: Continue follow-up with GYN as an outpatient. Patient has not undergone staging for surgical intervention as she is a poor surgical candidate. She is planned for radiation as an outpatient.  History of normal pressure hydrocephalus with shunt: Noted.  Essential hypertension: Was recently taken off her antihypertensive medication.  Hyperlipidemia:  continue statins.  Bipolar disorder: Continue oral Lamotrigine  and Depakote .  DVT prophylaxis: lovenox  Family Communication:none Status is: Inpatient Remains inpatient appropriate because: DKA    Code Status:     Code Status Orders  (From admission, onward)           Start     Ordered   01/14/24 0225  Full code  Continuous       Question:  By:  Answer:  Other   01/14/24 0227           Code Status History     Date Active Date Inactive Code Status Order ID Comments User Context   12/23/2023 1333 12/27/2023 2114 Full Code [id number]  Arlys Lamer ED   10/30/2023 0738 11/05/2023 1933 Full Code [id number]  Josiah Nigh, MD ED   05/29/2022 1653 05/30/2022 2100 Full Code [id number]  Gearl Keens, MD Inpatient   02/24/2022 1501 02/26/2022 1709 Full Code [id number]  [name] D, DO ED   01/31/2021 1622 04/30/2021 1759 DNR [id number]  Shellye Dibble, NP Inpatient   01/17/2021 0958 01/31/2021 1622 Full Code [id number]  [name], MD Inpatient   01/12/2021 1348 01/17/2021 0958 DNR [id number]  [name], MD Inpatient   01/11/2021 2313 01/12/2021 1348 Full Code [id number]  Bary Boss, [place] ED   10/22/2020 1853 10/27/2020 1824 Full Code [id number]  [name], DO ED   09/06/2020 1710 09/09/2020 2034 Full Code [id number]  [name], MD ED         IV Access:   Peripheral IV   Procedures and diagnostic studies:   CT HEAD CODE STROKE WO CONTRAST` Result Date:  01/15/2024 CLINICAL DATA:  Code stroke. EXAM: CT HEAD WITHOUT CONTRAST TECHNIQUE: Contiguous axial images were obtained from the base of the skull through the vertex without intravenous contrast. RADIATION DOSE REDUCTION: This exam was performed according to the departmental dose-optimization program which includes automated exposure control, adjustment of the mA and/or kV according to patient size and/or use of iterative reconstruction technique. COMPARISON:  Prior study from 12/24/2023 FINDINGS: Brain: Atrophy with moderate chronic microvascular ischemic disease. Chronic right frontal encephalomalacia. Right parietal approach shunt catheter in place with tip terminating at the septum pellucidum. Stable ventricular size and morphology without progressive hydrocephalus. No acute intracranial hemorrhage. No acute large vessel territory infarct. No mass lesion or midline shift. No extra-axial fluid collection. Vascular: No abnormal hyperdense vessel. Scattered vascular calcifications noted within the carotid siphons. Skull: Scalp soft tissues demonstrate no acute finding. Calvarium intact. Sinuses/Orbits: Globes and orbital soft tissues within normal limits. Paranasal sinuses are largely clear. No mastoid effusion. Other: None. ASPECTS [facility] Stroke Program Early CT Score) - Ganglionic level infarction (caudate, lentiform nuclei, internal capsule, insula, M1-M3 cortex): 7 - Supraganglionic infarction (M4-M6 cortex): 3 Total score (0-10 with 10 being normal): 10 IMPRESSION: 1. No acute intracranial abnormality. 2. ASPECTS is 10. 3. Right parietal approach VP shunt catheter in place. Stable ventricular size and morphology without progressive hydrocephalus. 4. Underlying atrophy with chronic small vessel ischemic disease, with chronic right frontal encephalomalacia. These results were communicated to choose for at 1:08 am on 01/15/2024 by text page via the [facility] messaging system. Electronically Signed   By: Virgia Griffins M.D.   On: 01/15/2024 01:09   US  RENAL Result Date: 01/14/2024 CLINICAL DATA:  Acute kidney injury EXAM: RENAL / URINARY TRACT ULTRASOUND COMPLETE COMPARISON:  None Available. FINDINGS: Right Kidney: Renal measurements: 9.2 x 4.2 x 4.1 cm = volume: 82 mL. Somewhat small volume but no renal cortical thinning. Echogenicity within normal limits. No mass or hydronephrosis visualized. Left Kidney: Renal measurements: 9.6 x 4.5 x 4.5 cm = volume: 102 mL. Echogenicity within normal limits. No mass or hydronephrosis visualized. Bladder: Appears normal for degree of bladder distention. IMPRESSION: No hydronephrosis, mass, or cortical thinning. Electronically Signed   By: Ronnette Coke M.D.   On: 01/14/2024 05:24   DG Chest Port 1 View Result Date: 01/13/2024 CLINICAL DATA:  Shortness of breath and hyperglycemia, altered mental status EXAM: PORTABLE CHEST 1 VIEW COMPARISON:  12/23/2023 FINDINGS: Stable cardiomediastinal silhouette. Aortic atherosclerotic calcification. Left basilar and right mid lung atelectasis. Low lung volumes accentuate pulmonary vascularity. No pleural effusion or pneumothorax. Partially visualized right chest VP shunt. IMPRESSION: Low lung volumes with bilateral atelectasis. Electronically Signed   By: Rozell Cornet M.D.   On: 01/13/2024 22:40     Medical Consultants:   None.   Subjective:    Carolyn Norton feels great this morning, in the mood.  Objective:    Vitals:   01/15/24 0045 01/15/24 0336 01/15/24 0812 01/15/24 0845  BP:  120/75 98/67   Pulse: 87 95 100 99  Resp: 15 14 20 15   Temp:  98.8 F (37.1 C) 98.8 F (37.1 C)  TempSrc:  Oral Axillary   SpO2: 98% 97% 96% 97%   SpO2: 97 % O2 Flow Rate (L/min): 2 L/min FiO2 (%): 28 %   Intake/Output Summary (Last 24 hours) at 01/15/2024 0856 Last data filed at 01/15/2024 0241 Gross per 24 hour  Intake 151.68 ml  Output 300 ml  Net -148.32 ml   There were no vitals filed for this  visit.  Exam: General exam: In no acute distress. Respiratory system: Good air movement and clear to auscultation. Cardiovascular system: S1 & S2 heard, RRR. No JVD. Gastrointestinal system: Abdomen is nondistended, soft and nontender.  Extremities: No pedal edema. Skin: No rashes, lesions or ulcers Psychiatry: Judgement and insight appear normal. Mood & affect appropriate.  Data Reviewed:    Labs: Basic Metabolic Panel: Recent Labs  Lab 01/14/24 0003 01/14/24 0034 01/14/24 0313 01/14/24 0400 01/14/24 0841 01/14/24 1219  NA 141 137 144 142 142 144  K 4.5 4.5 3.7 3.8 4.3 4.6  CL 96* 104  --  103 102 105  CO2 <7*  --   --  14* 20* 20*  GLUCOSE 567* 583*  --  299* 153* 174*  BUN 30* 29*  --  27* 25* 25*  CREATININE 3.37* 2.40*  --  2.56* 2.03* 1.92*  CALCIUM  9.3  --   --  9.3 8.9 8.9  MG  --   --   --  1.8  --   --   PHOS  --   --   --  2.4*  --   --    GFR Estimated Creatinine Clearance: 31.4 mL/min (A) (by C-G formula based on SCr of 1.92 mg/dL (H)). Liver Function Tests: Recent Labs  Lab 01/14/24 0400  AST 23  ALT 13  ALKPHOS 55  BILITOT 2.2*  PROT 6.0*  ALBUMIN 2.8*   No results for input(s): "LIPASE", "AMYLASE" in the last 168 hours. No results for input(s): "AMMONIA" in the last 168 hours. Coagulation profile No results for input(s): "INR", "PROTIME" in the last 168 hours. COVID-19 Labs  No results for input(s): "DDIMER", "FERRITIN", "LDH", "CRP" in the last 72 hours.  Lab Results  Component Value Date   SARSCOV2NAA NEGATIVE 12/23/2023   SARSCOV2NAA NEGATIVE 10/30/2023   SARSCOV2NAA NEGATIVE 04/29/2021   SARSCOV2NAA NEGATIVE 01/11/2021    CBC: Recent Labs  Lab 01/14/24 0003 01/14/24 0034 01/14/24 0313 01/14/24 0400  WBC 16.3*  --   --  14.8*  NEUTROABS 13.1*  --   --   --   HGB 14.6 16.0* 7.1* 12.9  HCT 48.8* 47.0* 21.0* 40.4  MCV 96.1  --   --  89.6  PLT 357  --   --  276   Cardiac Enzymes: No results for input(s): "CKTOTAL", "CKMB",  "CKMBINDEX", "TROPONINI" in the last 168 hours. BNP (last 3 results) No results for input(s): "PROBNP" in the last 8760 hours. CBG: Recent Labs  Lab 01/14/24 1648 01/14/24 1835 01/15/24 0004 01/15/24 0421 01/15/24 0811  GLUCAP 192* 238* 291* 197* 139*   D-Dimer: No results for input(s): "DDIMER" in the last 72 hours. Hgb A1c: No results for input(s): "HGBA1C" in the last 72 hours. Lipid Profile: No results for input(s): "CHOL", "HDL", "LDLCALC", "TRIG", "CHOLHDL", "LDLDIRECT" in the last 72 hours. Thyroid function studies: No results for input(s): "TSH", "T4TOTAL", "T3FREE", "THYROIDAB" in the last 72 hours.  Invalid input(s): "FREET3" Anemia work up: No results for input(s): "VITAMINB12", "FOLATE", "FERRITIN", "TIBC", "IRON", "RETICCTPCT" in the last 72 hours. Sepsis Labs: Recent Labs  Lab 01/13/24 2239 01/14/24 0003 01/14/24 0400 01/14/24 0416  WBC  --  16.3* 14.8*  --   LATICACIDVEN 5.4*  --   --  5.3*   Microbiology Recent Results (from the past 240 hours)  Culture, blood (Routine X 2) w Reflex to ID Panel     Status: None (Preliminary result)   Collection Time: 01/14/24  3:44 AM   Specimen: BLOOD  Result Value Ref Range Status   Specimen Description BLOOD RIGHT ANTECUBITAL  Final   Special Requests   Final    BOTTLES DRAWN AEROBIC AND ANAEROBIC Blood Culture results may not be optimal due to an inadequate volume of blood received in culture bottles   Culture   Final    NO GROWTH 1 DAY Performed at [facility], [address] [place], [place] [address]    Report Status PENDING  Incomplete     Medications:    atorvastatin   20 mg Oral Daily   divalproex   250 mg Oral TID   Gerhardt's butt cream   Topical TID   insulin  aspart  0-9 Units Subcutaneous Q4H   insulin  glargine-yfgn  10 Units Subcutaneous BID   lamoTRIgine   50 mg Oral BID   megestrol   40 mg Oral Daily   midodrine  5 mg Oral TID WC   pantoprazole   40 mg Oral Daily   sodium chloride   flush  3 mL Intravenous Q12H   vancomycin  variable dose per unstable renal function (pharmacist dosing)   Does not apply See admin instructions   Continuous Infusions:  cefTRIAXone  (ROCEPHIN )  IV 2 g (01/15/24 0518)      LOS: 1 day   Macdonald Savoy  [facility]  01/15/2024, 8:56 AM

## 2024-01-15 NOTE — Plan of Care (Signed)
  Problem: Nutritional: Goal: Maintenance of adequate nutrition will improve Outcome: Not Progressing   Problem: Skin Integrity: Goal: Risk for impaired skin integrity will decrease Outcome: Not Progressing

## 2024-01-15 NOTE — Evaluation (Signed)
 Clinical/Bedside Swallow Evaluation Patient Details  Name: Carolyn Norton MRN: [id number] Date of Birth: 10-29-1965  Today's Date: 01/15/2024 Time: SLP Start Time (ACUTE ONLY): 0845 SLP Stop Time (ACUTE ONLY): 0900 SLP Time Calculation (min) (ACUTE ONLY): 15 min  Past Medical History:  Past Medical History:  Diagnosis Date   Alzheimer disease (HCC)    from facility diagnosis, 12/10/22- orientation has improved since she had shunt placed.   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   COVID    History of   Depression    Diabetes mellitus without complication (HCC)    Type 2 (type 1 per patient and aunt)   Dyspnea    Hyperlipidemia    Hypertension    Normal pressure hydrocephalus (HCC)    Seasonal allergies    Past Surgical History:  Past Surgical History:  Procedure Laterality Date   BREAST EXCISIONAL BIOPSY Right    BREAST SURGERY     right breast   CATARACT EXTRACTION W/PHACO Left 02/12/2023   Procedure: CATARACT EXTRACTION PHACO AND INTRAOCULAR LENS PLACEMENT (IOC);  Surgeon: Tarri Farm, MD;  Location: [facility];  Service: Ophthalmology;  Laterality: Left;  CDE: 22.05   CATARACT EXTRACTION W/PHACO Right 02/26/2023   Procedure: CATARACT EXTRACTION PHACO AND INTRAOCULAR LENS PLACEMENT (IOC);  Surgeon: Tarri Farm, MD;  Location: [facility];  Service: Ophthalmology;  Laterality: Right;  CDE: 24.40   TOOTH EXTRACTION N/A 12/11/2022   Procedure: DENTAL RESTORATION/EXTRACTIONS;  Surgeon: Ascencion Lava, DMD;  Location: [facility] OR;  Service: Oral Surgery;  Laterality: N/A;   VENTRICULOPERITONEAL SHUNT N/A 05/29/2022   Procedure: Ventriculoperitoneal shunt placement;  Surgeon: Gearl Keens, MD;  Location: [facility] OR;  Service: Neurosurgery;  Laterality: N/A;   HPI:  58 y.o. female with hx DM type 2, uncontrolled with medication non adherence and resultant episodes of DKA, endometrial cancer not yet undergone staging surgery,  planned for radiation, NPH with shunt, HTN, HLD, who is brought in from nursing home due to altered mental status.  Found to be in DKA    Assessment / Plan / Recommendation  Clinical Impression  Pt re evaluated for dyspahgia this admission. Per RN, night nurse concerned about swallowing. Pt alert this morning and tolerated thin liquids, puree, PO meds with water , and smaller pieces of softer solids. A primary oral dysphagia was exhibited. Pt required greater than a minute to masticate small piece of soft solids. Pt with edentulous baseline status, reports does not wear dentures. Softer solids were cleared with a thin liquid alternation following very prolonged mastication. No overts s/sx of aspiration with any POs. Recommend dysphagia 2 (finely chopped) and thin liquids with meds as tolerated. Will follow up x1 for diet tolerance and trials of mechanical soft textures as tolerated.  SLP Visit Diagnosis: Dysphagia, oral phase (R13.11)    Aspiration Risk  Mild aspiration risk    Diet Recommendation   Thin;Dysphagia 2 (chopped)  Medication Administration: Whole meds with liquid    Other  Recommendations Oral Care Recommendations: Oral care BID    Recommendations for follow up therapy are one component of a multi-disciplinary discharge planning process, led by the attending physician.  Recommendations may be updated based on patient status, additional functional criteria and insurance authorization.  Follow up Recommendations Follow physician's recommendations for discharge plan and follow up therapies      Assistance Recommended at Discharge  Functional Status Assessment    Frequency and Duration min 1 x/week  1 week       Prognosis Prognosis for improved oropharyngeal function: Good Barriers to Reach Goals: Time post onset      Swallow Study   General Date of Onset: 01/13/24 HPI: 58 y.o. female with hx DM type 2, uncontrolled with medication non adherence and resultant episodes  of DKA, endometrial cancer not yet undergone staging surgery, planned for radiation, NPH with shunt, HTN, HLD, who is brought in from nursing home due to altered mental status.  Found to be in DKA Type of Study: Bedside Swallow Evaluation Previous Swallow Assessment: 02/25 D3, thin Diet Prior to this Study: NPO Temperature Spikes Noted: No Respiratory Status: Nasal cannula History of Recent Intubation: No Behavior/Cognition: Alert;Cooperative Oral Cavity Assessment: Within Functional Limits Oral Care Completed by SLP: No Oral Cavity - Dentition: Edentulous Vision: Functional for self-feeding Self-Feeding Abilities: Needs assist Patient Positioning: Upright in bed Baseline Vocal Quality: Low vocal intensity Volitional Swallow: Able to elicit    Oral/Motor/Sensory Function Overall Oral Motor/Sensory Function: Generalized oral weakness   Ice Chips Ice chips: Not tested   Thin Liquid Thin Liquid: Within functional limits Presentation: Cup;Straw    Nectar Thick Nectar Thick Liquid: Not tested   Honey Thick Honey Thick Liquid: Not tested   Puree Puree: Within functional limits   Solid     Solid: Impaired Presentation: Spoon Oral Phase Impairments: Reduced lingual movement/coordination;Impaired mastication Oral Phase Functional Implications: Prolonged oral transit (greater than 1 minute to masticate single pieces of softer solids; required liuid wash to clear oral cavity) Pharyngeal Phase Impairments: Multiple swallows      Jonaven Hilgers H. MA, CCC-SLP [facility] Services   01/15/2024,9:08 AM

## 2024-01-15 NOTE — Progress Notes (Signed)
 Mid line left arm infiltrated. Removed midline; notified pharmacy per protocol. Notified MD.    Noted, once midline dressing removed, outline of where the dressing placed. Area is red and start of some blistering. Arm is painful to touch. Elevated, placed on pillow with heat packs.

## 2024-01-15 NOTE — Progress Notes (Signed)
 Assessed patient's left arm. Noted increased swelling in left upper arm as well as lower forearm. Left arm measured 12 inches upper and lower. Definitely a difference in left vs right arm.  Notified Dr. Michell Norton to come to bedside to assess arm.

## 2024-01-15 NOTE — Progress Notes (Signed)
 Informed by RN that patient has increased swelling of her left arm and forearm in the setting of infiltration of midline catheter yesterday which was removed around 9 PM.  RN has been applying cold and hot compresses. Discussed with pharmacist - patient received antibiotics (vancomycin  and Rocephin ) and IV potassium about 24 hours ago, insulin  drip was stopped at 11:30 AM yesterday.  She was receiving IV fluids until midline was removed yesterday night.  I have seen the patient.  She is resting comfortably, awake and alert.  Patient is not able to tell me if she is having any pain, numbness, or tingling of her left upper extremity.  On exam, she has swelling of her left arm and forearm but no obvious signs of cellulitis.  No skin necrosis or bullae.  Upper extremity compartments soft to touch.  Radial pulse palpable and strong.  Hand/fingers warm to touch.  Venous duplex ultrasound ordered to rule out DVT.

## 2024-01-15 NOTE — Progress Notes (Signed)
 Buttocks and perineal area large in size MASD? Placed wound consult.

## 2024-01-15 NOTE — Consult Note (Addendum)
 WOC Nurse Consult Note: Reason for Consult: MASD buttocks/perineum  Wound type: Moisture Associated Skin Damage  ICD-10 CM Codes for Irritant Dermatitis  L24A2 - Due to fecal, urinary or dual incontinence  Pressure Injury POA: NA, not felt to be related to pressure  Measurement: widespread buttocks/perineum  Wound bed: erythema with scattered areas of partial thickness skin loss r/t moisture and friction  Drainage (amount, consistency, odor) some bleeding areas  Periwound: erythema  Dressing procedure/placement/frequency: Cleanse buttocks/sacrum/perineum with Vashe wound cleanser Timm Foot [id number]) do not rinse and allow to air dry. Apply Gerhardt's Butt Cream 3 times daily and prns soiling to entire area.    POC discussed with bedside nurse. Appreciate D. Jaynee Meyer, RN assistance with this consult. WOC team will not follow. Please reconsult if further needs arise.   Thank you,    Ronni Colace MSN, RN-BC, [employer] [phone]

## 2024-01-15 NOTE — Progress Notes (Signed)
 Brief code stroke cancellation note:  Patient was admitted with encephalopathy in the setting of DKA and has had mild waxing and waning of her encephalopathy.  She has a nonfocal examination, with slowed responses.  She has baseline cognitive issues secondary to her NPH.  At this time, I have very low suspicion that she has any type of acute change, but rather waxing waning encephalopathy that is been previously documented. NIHSS 7.  Neurology will be available on an as-needed basis.  She is not a candidate for thrombolytics, please call if full formal consultation is requested.   Ann Keto, MD [facility]   If 7pm- 7am, please page neurology on call as listed in AMION.

## 2024-01-15 NOTE — Progress Notes (Signed)
 VASCULAR LAB    Left upper extremity venous duplex has been performed.  See CV proc for preliminary results.   Jaran Sainz, RVT 01/15/2024, 11:02 AM

## 2024-01-15 NOTE — Plan of Care (Signed)
   Problem: Activity: Goal: Risk for activity intolerance will decrease Outcome: Progressing

## 2024-01-15 NOTE — Code Documentation (Signed)
 Stroke Response Nurse Documentation Code Documentation  Carolyn Norton is a 58 y.o. female admitted to [facility]  on 5/9 for DKA with past medical hx of endometrial cancer, HTN, HLD, DM2. On No antithrombotic. Code stroke was activated by nursing staff at the request of Dr. Michell Ahumada.   Patient on 4E unit where she was LKW at 2000 and now complaining of delayed responses.   Stroke team at the bedside after patient activation. Patient to CT with team. NIHSS 7, see documentation for details and code stroke times. Patient with disoriented, not following commands, and bilateral leg weakness on exam. The following imaging was completed:  CT Head. Patient is not a candidate for IV Thrombolytic due to nonfocal exam. Patient is not a candidate for IR due to low suspicion of stroke.    Bedside handoff with RN Abe Abed.    Dodson Freestone  Rapid Response RN

## 2024-01-16 DIAGNOSIS — G9340 Encephalopathy, unspecified: Secondary | ICD-10-CM | POA: Diagnosis not present

## 2024-01-16 DIAGNOSIS — N179 Acute kidney failure, unspecified: Secondary | ICD-10-CM | POA: Diagnosis not present

## 2024-01-16 DIAGNOSIS — E1110 Type 2 diabetes mellitus with ketoacidosis without coma: Secondary | ICD-10-CM | POA: Diagnosis not present

## 2024-01-16 DIAGNOSIS — J9601 Acute respiratory failure with hypoxia: Secondary | ICD-10-CM | POA: Diagnosis not present

## 2024-01-16 LAB — GLUCOSE, CAPILLARY
Glucose-Capillary: 101 mg/dL — ABNORMAL HIGH (ref 70–99)
Glucose-Capillary: 108 mg/dL — ABNORMAL HIGH (ref 70–99)
Glucose-Capillary: 183 mg/dL — ABNORMAL HIGH (ref 70–99)
Glucose-Capillary: 158 mg/dL — ABNORMAL HIGH (ref 70–99)
Glucose-Capillary: 128 mg/dL — ABNORMAL HIGH (ref 70–99)

## 2024-01-16 MED ORDER — MIDODRINE HCL 5 MG PO TABS
2.5000 mg | ORAL_TABLET | Freq: Two times a day (BID) | ORAL | Status: DC
  Administered 2024-01-16: 2.5 mg via ORAL
  Filled 2024-01-16 (×2): qty 1

## 2024-01-16 NOTE — Progress Notes (Signed)
 [facility] PROGRESS NOTE   Progress Note  Carolyn Norton  WUJ:[id number] DOB: Dec 05, 1965 DOA: 01/13/2024 PCP: Dickey Fought, PA    Brief Narrative:   Carolyn Norton is an 58 y.o. female past medical history diabetes mellitus type 2 noncompliant with her medication, endometrial cancer who has not yet undergone staging plan for radiation, normal pressure hydrocephalus with a shunt, hyperlipidemia brought in from skilled nursing facility for confusion was found to be in DKA.  Assessment/Plan:   DKA (diabetic ketoacidosis) (HCC) With a history of nonadherence of her insulin  at her facility. She was started on IV insulin  and IV IV fluids. Blood glucose improved now long-acting and sliding scale blood glucose ranging from 108-141. Continue current regimen. Upper extremity Doppler negative for DVT. PT and OT evaluation pending. Anticipate will need skilled nursing facility.  SIRS in the setting of DKA: Lactic acid elevation likely due to DKA, and infectious etiology evaluation unrevealing at this point in time. Has remained afebrile leukocytosis improving, procalcitonin is low yield. Blood cultures negative till date urinalysis showed no evidence of infection. Chest x-ray showed no evidence of infiltrates. Discontinue IV antibiotics. Continue titrate down midodrine. At home she is on no antihypertensive medication.  Acute kidney injury [facility]): Renal ultrasound showed cortical thinning. With a baseline creatinine of 0.8 on admission 2.4. Started on IV fluids her creatinine is improving. KVO IV fluids Basic Bolick panel is pending this morning.  Acute respiratory failure with hypoxia: Per EMS initially 83%, now resolved she has been weaned to room air. There is likely due to her acidosis she has now been weaned to room air.  Acute metabolic encephalopathy: Likely due to DKA now resolved patient able to carry on a conversation.  Erythrocytosis: Likely  hemoconcentration in the setting of dehydration now improving. Her baseline hemoglobin is back around 12.  Repeated this morning is 12.9.  History of endometrial cancer: Continue follow-up with GYN as an outpatient. Patient has not undergone staging for surgical intervention as she is a poor surgical candidate. She is planned for radiation as an outpatient.  History of normal pressure hydrocephalus with shunt: Noted.  Essential hypertension: Was recently taken off her antihypertensive medication.  Hyperlipidemia:  continue statins.  Bipolar disorder: Continue oral Lamotrigine  and Depakote .  DVT prophylaxis: lovenox  Family Communication:none Status is: Inpatient Remains inpatient appropriate because: DKA    Code Status:     Code Status Orders  (From admission, onward)           Start     Ordered   01/14/24 0225  Full code  Continuous       Question:  By:  Answer:  Other   01/14/24 0227           Code Status History     Date Active Date Inactive Code Status Order ID Comments User Context   12/23/2023 1333 12/27/2023 2114 Full Code [id number]  Arlys Lamer ED   10/30/2023 0738 11/05/2023 1933 Full Code [id number]  Josiah Nigh, MD ED   05/29/2022 1653 05/30/2022 2100 Full Code [id number]  Gearl Keens, MD Inpatient   02/24/2022 1501 02/26/2022 1709 Full Code [id number]  [name] D, DO ED   01/31/2021 1622 04/30/2021 1759 DNR [id number]  Shellye Dibble, NP Inpatient   01/17/2021 0958 01/31/2021 1622 Full Code [id number]  [name], MD Inpatient   01/12/2021 1348 01/17/2021 0958 DNR [id number]  [name], MD Inpatient   01/11/2021 2313 01/12/2021 1348 Full Code [id number]  Bary Boss, [place] ED   10/22/2020 1853 10/27/2020 1824 Full Code [id number]  [name], DO ED   09/06/2020 1710 09/09/2020 2034 Full Code [id number]  [name], MD ED         IV Access:   Peripheral IV   Procedures and diagnostic studies:   VAS US  UPPER EXTREMITY  VENOUS DUPLEX Result Date: 01/15/2024 UPPER VENOUS STUDY  Patient Name:  TALEYA MCDONALD  Date of Exam:   01/15/2024 Medical Rec #: [id number]           Accession #:    [id number] Date of Birth: Jan 23, 1966          Patient Gender: F Patient Age:   3 years Exam Location:  [facility] Procedure:      VAS US  UPPER EXTREMITY VENOUS DUPLEX Referring Phys: Corky Diener RATHORE --------------------------------------------------------------------------------  Indications: Left upper extremity pain, swelling, erythema status post IV infiltration Limitations: Poor ultrasound/tissue interface and patient unable to extend and rotate arm. Comparison Study: No prior study Performing Technologist: Carleene Chase RVS  Examination Guidelines: A complete evaluation includes B-mode imaging, spectral Doppler, color Doppler, and power Doppler as needed of all accessible portions of each vessel. Bilateral testing is considered an integral part of a complete examination. Limited examinations for reoccurring indications may be performed as noted.  Right Findings: +----------+------------+---------+-----------+----------+-------+ RIGHT     CompressiblePhasicitySpontaneousPropertiesSummary +----------+------------+---------+-----------+----------+-------+ Subclavian               Yes       Yes                      +----------+------------+---------+-----------+----------+-------+  Left Findings: +----------+------------+---------+-----------+----------+-------+ LEFT      CompressiblePhasicitySpontaneousPropertiesSummary +----------+------------+---------+-----------+----------+-------+ IJV           Full       Yes       Yes                      +----------+------------+---------+-----------+----------+-------+ Subclavian    Full       Yes       Yes                      +----------+------------+---------+-----------+----------+-------+ Axillary      Full       Yes       Yes                       +----------+------------+---------+-----------+----------+-------+ Brachial      Full                                          +----------+------------+---------+-----------+----------+-------+ Radial        Full                                          +----------+------------+---------+-----------+----------+-------+ Ulnar         Full                                          +----------+------------+---------+-----------+----------+-------+ Cephalic      Full                                          +----------+------------+---------+-----------+----------+-------+  Basilic       Full                                          +----------+------------+---------+-----------+----------+-------+  Summary:  Right: No evidence of superficial vein thrombosis in the upper extremity.  Left: No evidence of deep vein thrombosis in the upper extremity. No evidence of superficial vein thrombosis in the upper extremity.  *See table(s) above for measurements and observations.  Diagnosing physician: Angela Kell MD Electronically signed by Angela Kell MD on 01/15/2024 at 8:04:16 PM.    Final    CT HEAD CODE STROKE WO CONTRAST` Result Date: 01/15/2024 CLINICAL DATA:  Code stroke. EXAM: CT HEAD WITHOUT CONTRAST TECHNIQUE: Contiguous axial images were obtained from the base of the skull through the vertex without intravenous contrast. RADIATION DOSE REDUCTION: This exam was performed according to the departmental dose-optimization program which includes automated exposure control, adjustment of the mA and/or kV according to patient size and/or use of iterative reconstruction technique. COMPARISON:  Prior study from 12/24/2023 FINDINGS: Brain: Atrophy with moderate chronic microvascular ischemic disease. Chronic right frontal encephalomalacia. Right parietal approach shunt catheter in place with tip terminating at the septum pellucidum. Stable ventricular size and morphology without progressive  hydrocephalus. No acute intracranial hemorrhage. No acute large vessel territory infarct. No mass lesion or midline shift. No extra-axial fluid collection. Vascular: No abnormal hyperdense vessel. Scattered vascular calcifications noted within the carotid siphons. Skull: Scalp soft tissues demonstrate no acute finding. Calvarium intact. Sinuses/Orbits: Globes and orbital soft tissues within normal limits. Paranasal sinuses are largely clear. No mastoid effusion. Other: None. ASPECTS [facility] Stroke Program Early CT Score) - Ganglionic level infarction (caudate, lentiform nuclei, internal capsule, insula, M1-M3 cortex): 7 - Supraganglionic infarction (M4-M6 cortex): 3 Total score (0-10 with 10 being normal): 10 IMPRESSION: 1. No acute intracranial abnormality. 2. ASPECTS is 10. 3. Right parietal approach VP shunt catheter in place. Stable ventricular size and morphology without progressive hydrocephalus. 4. Underlying atrophy with chronic small vessel ischemic disease, with chronic right frontal encephalomalacia. These results were communicated to choose for at 1:08 am on 01/15/2024 by text page via the [facility] messaging system. Electronically Signed   By: Virgia Griffins M.D.   On: 01/15/2024 01:09     Medical Consultants:   None.   Subjective:    Carolyn Norton feels great this morning, in the mood.  Objective:    Vitals:   01/15/24 1947 01/16/24 0002 01/16/24 0413 01/16/24 0800  BP: 92/61 (!) 103/59 (!) 96/42 124/72  Pulse: 86 76 78 79  Resp: 20 16 20 17   Temp: 97.7 F (36.5 C) 97.9 F (36.6 C) 98.3 F (36.8 C) 99.3 F (37.4 C)  TempSrc: Oral Oral Axillary Axillary  SpO2: 97% 100% 99% 100%   SpO2: 100 % O2 Flow Rate (L/min): 2 L/min FiO2 (%): 28 %   Intake/Output Summary (Last 24 hours) at 01/16/2024 0830 Last data filed at 01/16/2024 8119 Gross per 24 hour  Intake 1313.85 ml  Output 700 ml  Net 613.85 ml   There were no vitals filed for this visit.  Exam: General  exam: In no acute distress. Respiratory system: Good air movement and clear to auscultation. Cardiovascular system: S1 & S2 heard, RRR. No JVD. Gastrointestinal system: Abdomen is nondistended, soft and nontender.  Extremities: No pedal edema. Skin: No rashes, lesions or ulcers Psychiatry: Judgement and insight  appear normal. Mood & affect appropriate.  Data Reviewed:    Labs: Basic Metabolic Panel: Recent Labs  Lab 01/14/24 0003 01/14/24 0034 01/14/24 0313 01/14/24 0400 01/14/24 0841 01/14/24 1219 01/15/24 0937  NA 141 137 144 142 142 144 144  K 4.5 4.5 3.7 3.8 4.3 4.6 3.5  CL 96* 104  --  103 102 105 108  CO2 <7*  --   --  14* 20* 20* 24  GLUCOSE 567* 583*  --  299* 153* 174* 147*  BUN 30* 29*  --  27* 25* 25* 17  CREATININE 3.37* 2.40*  --  2.56* 2.03* 1.92* 1.21*  CALCIUM  9.3  --   --  9.3 8.9 8.9 8.7*  MG  --   --   --  1.8  --   --   --   PHOS  --   --   --  2.4*  --   --   --    GFR Estimated Creatinine Clearance: 49.7 mL/min (A) (by C-G formula based on SCr of 1.21 mg/dL (H)). Liver Function Tests: Recent Labs  Lab 01/14/24 0400  AST 23  ALT 13  ALKPHOS 55  BILITOT 2.2*  PROT 6.0*  ALBUMIN 2.8*   No results for input(s): "LIPASE", "AMYLASE" in the last 168 hours. No results for input(s): "AMMONIA" in the last 168 hours. Coagulation profile No results for input(s): "INR", "PROTIME" in the last 168 hours. COVID-19 Labs  No results for input(s): "DDIMER", "FERRITIN", "LDH", "CRP" in the last 72 hours.  Lab Results  Component Value Date   SARSCOV2NAA NEGATIVE 12/23/2023   SARSCOV2NAA NEGATIVE 10/30/2023   SARSCOV2NAA NEGATIVE 04/29/2021   SARSCOV2NAA NEGATIVE 01/11/2021    CBC: Recent Labs  Lab 01/14/24 0003 01/14/24 0034 01/14/24 0313 01/14/24 0400 01/15/24 0937  WBC 16.3*  --   --  14.8* 12.6*  NEUTROABS 13.1*  --   --   --   --   HGB 14.6 16.0* 7.1* 12.9 12.8  HCT 48.8* 47.0* 21.0* 40.4 40.4  MCV 96.1  --   --  89.6 90.0  PLT 357  --    --  276 249   Cardiac Enzymes: No results for input(s): "CKTOTAL", "CKMB", "CKMBINDEX", "TROPONINI" in the last 168 hours. BNP (last 3 results) No results for input(s): "PROBNP" in the last 8760 hours. CBG: Recent Labs  Lab 01/15/24 1217 01/15/24 1625 01/15/24 2112 01/16/24 0610 01/16/24 0759  GLUCAP 136* 203* 141* 101* 108*   D-Dimer: No results for input(s): "DDIMER" in the last 72 hours. Hgb A1c: No results for input(s): "HGBA1C" in the last 72 hours. Lipid Profile: No results for input(s): "CHOL", "HDL", "LDLCALC", "TRIG", "CHOLHDL", "LDLDIRECT" in the last 72 hours. Thyroid function studies: No results for input(s): "TSH", "T4TOTAL", "T3FREE", "THYROIDAB" in the last 72 hours.  Invalid input(s): "FREET3" Anemia work up: No results for input(s): "VITAMINB12", "FOLATE", "FERRITIN", "TIBC", "IRON", "RETICCTPCT" in the last 72 hours. Sepsis Labs: Recent Labs  Lab 01/13/24 2239 01/14/24 0003 01/14/24 0400 01/14/24 0416 01/15/24 0937  PROCALCITON  --   --   --   --  0.30  WBC  --  16.3* 14.8*  --  12.6*  LATICACIDVEN 5.4*  --   --  5.3*  --    Microbiology Recent Results (from the past 240 hours)  Culture, blood (Routine X 2) w Reflex to ID Panel     Status: None (Preliminary result)   Collection Time: 01/14/24  3:44 AM   Specimen: BLOOD  Result Value Ref Range Status   Specimen Description BLOOD RIGHT ANTECUBITAL  Final   Special Requests   Final    BOTTLES DRAWN AEROBIC AND ANAEROBIC Blood Culture results may not be optimal due to an inadequate volume of blood received in culture bottles   Culture   Final    NO GROWTH 1 DAY Performed at [facility], [address] [place], [place] [address]    Report Status PENDING  Incomplete     Medications:    atorvastatin   20 mg Oral Daily   divalproex   250 mg Oral TID   Gerhardt's butt cream   Topical TID   insulin  aspart  0-15 Units Subcutaneous TID WC   insulin  aspart  0-5 Units Subcutaneous QHS    insulin  aspart  4 Units Subcutaneous TID WC   insulin  glargine-yfgn  10 Units Subcutaneous BID   lamoTRIgine   50 mg Oral BID   megestrol   40 mg Oral Daily   midodrine  2.5 mg Oral TID WC   pantoprazole   40 mg Oral Daily   sodium chloride  flush  3 mL Intravenous Q12H   Continuous Infusions:  sodium chloride  50 mL/hr at 01/15/24 1013   cefTRIAXone  (ROCEPHIN )  IV Stopped (01/16/24 0527)   doxycycline  (VIBRAMYCIN ) IV Stopped (01/16/24 0201)      LOS: 2 days   Macdonald Savoy  [facility]  01/16/2024, 8:30 AM

## 2024-01-16 NOTE — Evaluation (Signed)
 Physical Therapy Evaluation Patient Details Name: Carolyn Norton MRN: [id number] DOB: 25-Feb-1966 Today's Date: 01/16/2024  History of Present Illness  The pt is a 58 yo female presenting 5/8 with hyperglycemia and AMS. Admitted for management of DKA and SIRS in setting of DKA. PMH includes: NPH s/p VP shunt, DM, bipolar, endometrial CA, HTN, obesity.  Clinical Impression  Pt in bed upon arrival of PT, agreeable to evaluation at this time. Per chart review, pt with recent and rapid deterioration of mobility, as she was able to ambulate 50 ft with RW and minA in February of this year. The pt has since been at [facility] and is unable to provide any history on her mobility or daily routines. 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 She was able to intermittently follow simple commands and answered all questions (~10% accurate when compared to chart). The pt presents with more limitation in RLE ROM and strength compared to left, and reports it is more painful during mobilization. She also needs significant assistance to maintain seated balance at this time due to L lateral lean. Will continue to follow acutely to maximize return of functional strength to assist with bed mobility and possible OOB transfers. Anticipate return to long term care at facility, but may benefit from short bout of rehab to see if gains can be made in regards to pt participation, strength, and decreased caregiver burden.          If plan is discharge home, recommend the following: Two people to help with walking and/or transfers;Two people to help with bathing/dressing/bathroom   Can travel by private vehicle   No    Equipment Recommendations Wheelchair (measurements PT);Wheelchair  cushion (measurements PT);Hoyer lift  Recommendations for [employer]       Functional Status Assessment Patient has had a recent decline in their functional status and demonstrates the ability to make significant improvements in function in a reasonable and predictable amount of time.     Precautions / Restrictions Precautions Precautions: Fall Recall of Precautions/Restrictions: Impaired Restrictions Weight Bearing Restrictions Per Provider Order: No      Mobility  Bed Mobility Overal bed mobility: Needs Assistance Bed Mobility: Supine to Sit, Sit to Supine     Supine to sit: Total assist, HOB elevated Sit to supine: Total assist   General bed mobility comments: total A from therapist other than pt assisting with some movement of LLE    Transfers                   General transfer comment: deferred due to limited strength in BLE and bilateral plantarflexion contractures       Balance Overall balance assessment: Needs assistance Sitting-balance support: No upper extremity supported, Feet supported Sitting balance-Leahy Scale: Poor Sitting balance - Comments: dependent on therapist support Postural control: Posterior lean, Left lateral lean                                   Pertinent Vitals/Pain Pain Assessment Pain Assessment: Faces Faces Pain Scale: Hurts little more Pain Location: "everywhere" seems more painful for ROM to RLE than LLE Pain Descriptors / Indicators: Grimacing, Guarding Pain Intervention(s): Limited activity within patient's tolerance, Monitored during session, Repositioned  Home Living Family/patient expects to be discharged to:: Skilled nursing facility                   Additional Comments: pt unable to report, states she lives at home with her parents, is walking and driving. Pt also reports use of WC, RW, and no DME. no family present to confirm. per admission in 4/19, pt bedbound, in 2/25 patient was  ambulatory x 50'    Prior Function Prior Level of Function : Patient poor historian/Family not available             Mobility Comments: reports use of WC, RW, and no DME. ADLs Comments: pt states assist from her parents for ADLs     Extremity/Trunk Assessment   Upper Extremity Assessment Upper Extremity Assessment: Defer to OT evaluation;Generalized weakness (able to extend fingers, pull on therapist but not push. limited by impaired cognition. resting with fingers flexed)    Lower Extremity Assessment Lower Extremity Assessment: Generalized weakness;RLE deficits/detail;LLE deficits/detail RLE Deficits / Details: noted PF contracture, t allowing only partial ROM at knee and hip, did allow for hip abd/add. increased tone/tightness, was able to flex knee when sitting EOB RLE: Unable to fully assess due to pain LLE Deficits / Details: pt able to assist with knee and hip flexion, assist with movement to EOB, less PF contracture than noted in RLE LLE Sensation: WNL    Cervical / Trunk Assessment Cervical / Trunk Assessment: Other exceptions Cervical / Trunk Exceptions: stiff with preference for R gaze and trunk rotation  Communication   Communication Communication: Impaired Factors Affecting Communication: Difficulty expressing self;Reduced clarity of speech    Cognition Arousal: Lethargic Behavior During Therapy: Flat affect   PT - Cognitive impairments: No family/caregiver present to determine baseline, Orientation, Awareness, Attention, Memory, Initiation, Problem solving, Sequencing, Safety/Judgement   Orientation impairments: Person, Place, Time, Situation (pt able to state her name and birthday, but not her age. not aware of any aspect of date, oriented to hospital but only when given choices)                   PT - Cognition Comments: pt answering intermittent questions. staring through therapist when she doesnt know answer. pt states she lives at home with her  parents, but from chart pt has resided at [facility] since feb. not able to recall orientation information given in session. Following commands: Impaired Following commands impaired: Follows one step commands inconsistently     Cueing Cueing Techniques: Verbal cues, Gestural cues, Tactile cues, Visual cues     General Comments General comments (skin integrity, edema, etc.): VSS, significant skin breakdown of perineum        Assessment/Plan    PT Assessment Patient needs continued PT services  PT Problem List Decreased strength;Decreased range of motion;Decreased activity tolerance;Decreased balance;Decreased mobility;Decreased cognition;Decreased knowledge of use of DME;Decreased safety awareness;Obesity;Decreased skin integrity       PT Treatment Interventions Gait training;DME instruction;Stair training;Functional mobility training;Therapeutic activities;Therapeutic exercise;Balance training;Cognitive remediation;Patient/family education    PT Goals (Current goals can be found in the Care Plan section)  Acute Rehab PT Goals Patient Stated Goal: none stated PT Goal Formulation: With patient Time For Goal Achievement: 01/30/24 Potential to Achieve Goals: Fair    Frequency Min 1X/week        AM-PAC PT "6 Clicks" Mobility  Outcome Measure Help needed turning from your back to your side while in a flat bed without using bedrails?: Total Help needed moving from  lying on your back to sitting on the side of a flat bed without using bedrails?: Total Help needed moving to and from a bed to a chair (including a wheelchair)?: Total Help needed standing up from a chair using your arms (e.g., wheelchair or bedside chair)?: Total Help needed to walk in hospital room?: Total Help needed climbing 3-5 steps with a railing? : Total 6 Click Score: 6    End of Session   Activity Tolerance: Patient limited by fatigue Patient left: in bed;with call bell/phone within reach Nurse Communication:  Mobility status;Need for lift equipment PT Visit Diagnosis: Muscle weakness (generalized) (M62.81);Pain;Other symptoms and signs involving the nervous system (R29.898) Pain - Right/Left: Right Pain - part of body: Leg    Time: 1429-1450 PT Time Calculation (min) (ACUTE ONLY): 21 min   Charges:   PT Evaluation $PT Eval Moderate Complexity: 1 Mod   PT General Charges $$ ACUTE PT VISIT: 1 Visit         Barnabas Booth, PT, DPT   Acute Rehabilitation Department Office [phone] Secure Chat Communication Preferred  Lona Rist 01/16/2024, 5:44 PM

## 2024-01-16 NOTE — Plan of Care (Signed)
   Problem: Metabolic: Goal: Ability to maintain appropriate glucose levels will improve Outcome: Progressing

## 2024-01-16 NOTE — Progress Notes (Signed)
 Patient refused lab work times 2 per phlebotomist.

## 2024-01-17 DIAGNOSIS — N179 Acute kidney failure, unspecified: Secondary | ICD-10-CM | POA: Diagnosis not present

## 2024-01-17 DIAGNOSIS — G9340 Encephalopathy, unspecified: Secondary | ICD-10-CM | POA: Diagnosis not present

## 2024-01-17 DIAGNOSIS — E1110 Type 2 diabetes mellitus with ketoacidosis without coma: Secondary | ICD-10-CM | POA: Diagnosis not present

## 2024-01-17 DIAGNOSIS — J9601 Acute respiratory failure with hypoxia: Secondary | ICD-10-CM | POA: Diagnosis not present

## 2024-01-17 LAB — GLUCOSE, CAPILLARY
Glucose-Capillary: 111 mg/dL — ABNORMAL HIGH (ref 70–99)
Glucose-Capillary: 203 mg/dL — ABNORMAL HIGH (ref 70–99)
Glucose-Capillary: 152 mg/dL — ABNORMAL HIGH (ref 70–99)
Glucose-Capillary: 220 mg/dL — ABNORMAL HIGH (ref 70–99)

## 2024-01-17 LAB — BASIC METABOLIC PANEL WITH GFR
Anion gap: 10 (ref 5–15)
BUN: 12 mg/dL (ref 6–20)
CO2: 23 mmol/L (ref 22–32)
Calcium: 8.2 mg/dL — ABNORMAL LOW (ref 8.9–10.3)
Chloride: 105 mmol/L (ref 98–111)
Creatinine, Ser: 0.98 mg/dL (ref 0.44–1.00)
GFR, Estimated: >60 mL/min (ref >60)
Glucose, Bld: 76 mg/dL (ref 70–99)
Potassium: 3.4 mmol/L — ABNORMAL LOW (ref 3.5–5.1)
Sodium: 138 mmol/L (ref 135–145)

## 2024-01-17 MED ORDER — POTASSIUM CHLORIDE CRYS ER 20 MEQ PO TBCR
40.0000 meq | EXTENDED_RELEASE_TABLET | Freq: Once | ORAL | Status: AC
  Administered 2024-01-17: 40 meq via ORAL
  Filled 2024-01-17: qty 2

## 2024-01-17 NOTE — Progress Notes (Signed)
 Speech Language Pathology Treatment: Dysphagia  Patient Details Name: Carolyn Norton MRN: [id number] DOB: 04-04-1966 Today's Date: 01/17/2024 Time: 4098-1191 SLP Time Calculation (min) (ACUTE ONLY): 9 min  Assessment / Plan / Recommendation Clinical Impression  Patient agreeable to diagnostic po trials. Per CNA, patient with significantly prolonged mastication of dysphagia 2 solids this am however during po trials during treatment, patient able to consume pureed solids and dysphagia 3 solids with appropriate mastication timing for edentulous state and full oral clearance. Patient without overt s/s of aspiration across consistencies. Question if fluctuating cognitive function plays a role in oral phase abilities. Will f/u x 1 to determine if patient can advance further before discharging and allowing MD to advance as tolerated.    HPI HPI: 58 y.o. female with hx DM type 2, uncontrolled with medication non adherence and resultant episodes of DKA, endometrial cancer not yet undergone staging surgery, planned for radiation, NPH with shunt, HTN, HLD, who is brought in from nursing home due to altered mental status.  Found to be in DKA      SLP Plan  Continue with current plan of care      Recommendations for follow up therapy are one component of a multi-disciplinary discharge planning process, led by the attending physician.  Recommendations may be updated based on patient status, additional functional criteria and insurance authorization.    Recommendations  Diet recommendations: Dysphagia 2 (fine chop);Thin liquid Liquids provided via: Cup;Straw Medication Administration: Whole meds with liquid Supervision: Staff to assist with self feeding Compensations: Slow rate;Small sips/bites Postural Changes and/or Swallow Maneuvers: Seated upright 90 degrees                  Oral care BID     Dysphagia, oral phase (R13.11)     Continue with current plan of care    [facility] MA,  CCC-SLP  Carolyn Norton  01/17/2024, 11:19 AM

## 2024-01-17 NOTE — Progress Notes (Signed)
 [facility] PROGRESS NOTE   Progress Note  Carolyn Norton  ZOX:[id number] DOB: Nov 20, 1965 DOA: 01/13/2024 PCP: Dickey Fought, PA    Brief Narrative:   Carolyn Norton is an 58 y.o. female past medical history diabetes mellitus type 2 noncompliant with her medication, endometrial cancer who has not yet undergone staging plan for radiation, normal pressure hydrocephalus with a shunt, hyperlipidemia brought in from skilled nursing facility for confusion was found to be in DKA.  Assessment/Plan:   DKA (diabetic ketoacidosis) (HCC) With a history of nonadherence of her insulin  at her facility. Blood glucose improved now long-acting and sliding scale blood glucose ranging from 108-141. Continue current regimen. Upper extremity Doppler negative for DVT. PT and OT evaluation, will need to go to skilled nursing facility.  SIRS in the setting of DKA: Lactic acid elevation likely due to DKA, and infectious etiology evaluation unrevealing at this point in time. Has remained afebrile leukocytosis improving, procalcitonin is low yield. Blood cultures negative till date urinalysis showed no evidence of infection. Chest x-ray showed no evidence of infiltrates. Discontinue IV antibiotics. Discontinue midodrine. At home she is on no antihypertensive medication. Will monitor for an additional 24 hours if basic metabolic panel is improved tomorrow and blood pressure remained stable can probably be transferred to skilled nursing facility.  Acute kidney injury [facility]): Renal ultrasound showed cortical thinning. Her creatinine has improved to baseline. KVO IV fluids  Acute respiratory failure with hypoxia: Per EMS initially 83%, now resolved she has been weaned to room air. This was likely due to her acidosis she has now been weaned to room air.  Acute metabolic encephalopathy: Likely due to DKA now resolved patient able to carry on a conversation.  Erythrocytosis: Likely  hemoconcentration in the setting of dehydration now improving. Her baseline hemoglobin is back around 12.  Repeated this morning is 12.9.  History of endometrial cancer: Continue follow-up with GYN as an outpatient. Patient has not undergone staging for surgical intervention as she is a poor surgical candidate. She is planned for radiation as an outpatient.  History of normal pressure hydrocephalus with shunt: Noted.  Essential hypertension: Was recently taken off her antihypertensive medication.  Hyperlipidemia:  continue statins.  Bipolar disorder: Continue oral Lamotrigine  and Depakote .  DVT prophylaxis: lovenox  Family Communication:none Status is: Inpatient Remains inpatient appropriate because: DKA    Code Status:     Code Status Orders  (From admission, onward)           Start     Ordered   01/14/24 0225  Full code  Continuous       Question:  By:  Answer:  Other   01/14/24 0227           Code Status History     Date Active Date Inactive Code Status Order ID Comments User Context   12/23/2023 1333 12/27/2023 2114 Full Code [id number]  Arlys Lamer ED   10/30/2023 0738 11/05/2023 1933 Full Code [id number]  Josiah Nigh, MD ED   05/29/2022 1653 05/30/2022 2100 Full Code [id number]  Gearl Keens, MD Inpatient   02/24/2022 1501 02/26/2022 1709 Full Code [id number]  [name] D, DO ED   01/31/2021 1622 04/30/2021 1759 DNR [id number]  Shellye Dibble, NP Inpatient   01/17/2021 0958 01/31/2021 1622 Full Code [id number]  Carolyn Castleman, MD Inpatient   01/12/2021 1348 01/17/2021 0958 DNR [id number]  Carolyn Castleman, MD Inpatient   01/11/2021 2313 01/12/2021 1348 Full Code [id number]  Carolyn Cannon  N, DO ED   10/22/2020 1853 10/27/2020 1824 Full Code [id number]  Carolyn Coop, DO ED   09/06/2020 1710 09/09/2020 2034 Full Code [id number]  Carolyn Glisson, MD ED         IV Access:   Peripheral IV   Procedures and diagnostic studies:   VAS US  UPPER EXTREMITY  VENOUS DUPLEX Result Date: 01/15/2024 UPPER VENOUS STUDY  Patient Name:  Carolyn Norton  Date of Exam:   01/15/2024 Medical Rec #: [id number]           Accession #:    [id number] Date of Birth: 12-11-1965          Patient Gender: F Patient Age:   30 years Exam Location:  [facility] Procedure:      VAS US  UPPER EXTREMITY VENOUS DUPLEX Referring Phys: Corky Diener RATHORE --------------------------------------------------------------------------------  Indications: Left upper extremity pain, swelling, erythema status post IV infiltration Limitations: Poor ultrasound/tissue interface and patient unable to extend and rotate arm. Comparison Study: No prior study Performing Technologist: Carleene Chase RVS  Examination Guidelines: A complete evaluation includes B-mode imaging, spectral Doppler, color Doppler, and power Doppler as needed of all accessible portions of each vessel. Bilateral testing is considered an integral part of a complete examination. Limited examinations for reoccurring indications may be performed as noted.  Right Findings: +----------+------------+---------+-----------+----------+-------+ RIGHT     CompressiblePhasicitySpontaneousPropertiesSummary +----------+------------+---------+-----------+----------+-------+ Subclavian               Yes       Yes                      +----------+------------+---------+-----------+----------+-------+  Left Findings: +----------+------------+---------+-----------+----------+-------+ LEFT      CompressiblePhasicitySpontaneousPropertiesSummary +----------+------------+---------+-----------+----------+-------+ IJV           Full       Yes       Yes                      +----------+------------+---------+-----------+----------+-------+ Subclavian    Full       Yes       Yes                      +----------+------------+---------+-----------+----------+-------+ Axillary      Full       Yes       Yes                       +----------+------------+---------+-----------+----------+-------+ Brachial      Full                                          +----------+------------+---------+-----------+----------+-------+ Radial        Full                                          +----------+------------+---------+-----------+----------+-------+ Ulnar         Full                                          +----------+------------+---------+-----------+----------+-------+ Cephalic      Full                                          +----------+------------+---------+-----------+----------+-------+  Basilic       Full                                          +----------+------------+---------+-----------+----------+-------+  Summary:  Right: No evidence of superficial vein thrombosis in the upper extremity.  Left: No evidence of deep vein thrombosis in the upper extremity. No evidence of superficial vein thrombosis in the upper extremity.  *See table(s) above for measurements and observations.  Diagnosing physician: Angela Kell MD Electronically signed by Angela Kell MD on 01/15/2024 at 8:04:16 PM.    Final      Medical Consultants:   None.   Subjective:    Marisue Side tolerating her diet this morning.  Objective:    Vitals:   01/16/24 1900 01/16/24 2300 01/17/24 0300 01/17/24 0500  BP: 106/65 110/88 (!) 124/59   Pulse: 70 80 78   Resp: 16 14 19    Temp: (!) 97.5 F (36.4 C) 98.2 F (36.8 C) 98 F (36.7 C)   TempSrc: Oral Oral Oral   SpO2: 97% 100% 98%   Weight:    80.3 kg   SpO2: 98 % O2 Flow Rate (L/min): 2 L/min FiO2 (%): 28 %   Intake/Output Summary (Last 24 hours) at 01/17/2024 0818 Last data filed at 01/17/2024 0600 Gross per 24 hour  Intake 1805.41 ml  Output 925 ml  Net 880.41 ml   Filed Weights   01/17/24 0500  Weight: 80.3 kg    Exam: General exam: In no acute distress. Respiratory system: Good air movement and clear to  auscultation. Cardiovascular system: S1 & S2 heard, RRR. No JVD. Gastrointestinal system: Abdomen is nondistended, soft and nontender.  Extremities: No pedal edema. Skin: No rashes, lesions or ulcers Psychiatry: Judgement and insight appear normal. Mood & affect appropriate.  Data Reviewed:    Labs: Basic Metabolic Panel: Recent Labs  Lab 01/14/24 0400 01/14/24 0841 01/14/24 1219 01/15/24 0937 01/17/24 0353  NA 142 142 144 144 138  K 3.8 4.3 4.6 3.5 3.4*  CL 103 102 105 108 105  CO2 14* 20* 20* 24 23  GLUCOSE 299* 153* 174* 147* 76  BUN 27* 25* 25* 17 12  CREATININE 2.56* 2.03* 1.92* 1.21* 0.98  CALCIUM  9.3 8.9 8.9 8.7* 8.2*  MG 1.8  --   --   --   --   PHOS 2.4*  --   --   --   --    GFR Estimated Creatinine Clearance: 65.7 mL/min (by C-G formula based on SCr of 0.98 mg/dL). Liver Function Tests: Recent Labs  Lab 01/14/24 0400  AST 23  ALT 13  ALKPHOS 55  BILITOT 2.2*  PROT 6.0*  ALBUMIN 2.8*   No results for input(s): "LIPASE", "AMYLASE" in the last 168 hours. No results for input(s): "AMMONIA" in the last 168 hours. Coagulation profile No results for input(s): "INR", "PROTIME" in the last 168 hours. COVID-19 Labs  No results for input(s): "DDIMER", "FERRITIN", "LDH", "CRP" in the last 72 hours.  Lab Results  Component Value Date   SARSCOV2NAA NEGATIVE 12/23/2023   SARSCOV2NAA NEGATIVE 10/30/2023   SARSCOV2NAA NEGATIVE 04/29/2021   SARSCOV2NAA NEGATIVE 01/11/2021    CBC: Recent Labs  Lab 01/14/24 0003 01/14/24 0034 01/14/24 0313 01/14/24 0400 01/15/24 0937  WBC 16.3*  --   --  14.8* 12.6*  NEUTROABS 13.1*  --   --   --   --  HGB 14.6 16.0* 7.1* 12.9 12.8  HCT 48.8* 47.0* 21.0* 40.4 40.4  MCV 96.1  --   --  89.6 90.0  PLT 357  --   --  276 249   Cardiac Enzymes: No results for input(s): "CKTOTAL", "CKMB", "CKMBINDEX", "TROPONINI" in the last 168 hours. BNP (last 3 results) No results for input(s): "PROBNP" in the last 8760  hours. CBG: Recent Labs  Lab 01/16/24 0759 01/16/24 1210 01/16/24 1633 01/16/24 2024 01/17/24 0559  GLUCAP 108* 183* 158* 128* 111*   D-Dimer: No results for input(s): "DDIMER" in the last 72 hours. Hgb A1c: No results for input(s): "HGBA1C" in the last 72 hours. Lipid Profile: No results for input(s): "CHOL", "HDL", "LDLCALC", "TRIG", "CHOLHDL", "LDLDIRECT" in the last 72 hours. Thyroid function studies: No results for input(s): "TSH", "T4TOTAL", "T3FREE", "THYROIDAB" in the last 72 hours.  Invalid input(s): "FREET3" Anemia work up: No results for input(s): "VITAMINB12", "FOLATE", "FERRITIN", "TIBC", "IRON", "RETICCTPCT" in the last 72 hours. Sepsis Labs: Recent Labs  Lab 01/13/24 2239 01/14/24 0003 01/14/24 0400 01/14/24 0416 01/15/24 0937  PROCALCITON  --   --   --   --  0.30  WBC  --  16.3* 14.8*  --  12.6*  LATICACIDVEN 5.4*  --   --  5.3*  --    Microbiology Recent Results (from the past 240 hours)  Culture, blood (Routine X 2) w Reflex to ID Panel     Status: None (Preliminary result)   Collection Time: 01/14/24  3:44 AM   Specimen: BLOOD  Result Value Ref Range Status   Specimen Description BLOOD RIGHT ANTECUBITAL  Final   Special Requests   Final    BOTTLES DRAWN AEROBIC AND ANAEROBIC Blood Culture results may not be optimal due to an inadequate volume of blood received in culture bottles   Culture   Final    NO GROWTH 2 DAYS Performed at [facility], [address], [place], [place] [address]    Report Status PENDING  Incomplete     Medications:    atorvastatin   20 mg Oral Daily   Gerhardt's butt cream   Topical TID   insulin  aspart  0-15 Units Subcutaneous TID WC   insulin  aspart  0-5 Units Subcutaneous QHS   insulin  aspart  4 Units Subcutaneous TID WC   insulin  glargine-yfgn  10 Units Subcutaneous BID   lamoTRIgine   50 mg Oral BID   megestrol   40 mg Oral Daily   midodrine  2.5 mg Oral BID WC   pantoprazole   40 mg Oral Daily   sodium  chloride flush  3 mL Intravenous Q12H   Continuous Infusions:      LOS: 3 days   Macdonald Savoy  [facility]  01/17/2024, 8:18 AM

## 2024-01-18 DIAGNOSIS — N179 Acute kidney failure, unspecified: Secondary | ICD-10-CM | POA: Diagnosis not present

## 2024-01-18 DIAGNOSIS — J9601 Acute respiratory failure with hypoxia: Secondary | ICD-10-CM | POA: Diagnosis not present

## 2024-01-18 DIAGNOSIS — E1110 Type 2 diabetes mellitus with ketoacidosis without coma: Secondary | ICD-10-CM | POA: Diagnosis not present

## 2024-01-18 LAB — GLUCOSE, CAPILLARY
Glucose-Capillary: 111 mg/dL — ABNORMAL HIGH (ref 70–99)
Glucose-Capillary: 187 mg/dL — ABNORMAL HIGH (ref 70–99)

## 2024-01-18 MED ORDER — INSULIN GLARGINE-YFGN 100 UNIT/ML ~~LOC~~ SOLN: 20.0000 [IU] | Freq: Every day | SUBCUTANEOUS | Status: DC

## 2024-01-18 MED ORDER — INSULIN ASPART 100 UNIT/ML IJ SOLN: 6.0000 [IU] | Freq: Three times a day (TID) | INTRAMUSCULAR | Status: DC

## 2024-01-18 NOTE — Progress Notes (Signed)
 Patient report given to RN of [facility], all questions were answered

## 2024-01-18 NOTE — Care Management Important Message (Signed)
 Important Message  Patient Details  Name: Carolyn Norton MRN: [id number] Date of Birth: 1966-04-09   Important Message Given:  Yes - Medicare IM     Janith Melnick 01/18/2024, 11:49 AM

## 2024-01-18 NOTE — Discharge Summary (Signed)
 Physician Discharge Summary  Carolyn Norton UVO:[id number] DOB: 04-Aug-1966 DOA: 01/13/2024  PCP: Dickey Fought, PA  Admit date: 01/13/2024 Discharge date: 01/18/2024  Admitted From: SNF Disposition:  SNF  Recommendations for Outpatient Follow-up:  Follow up with PCP in 1-2 weeks Please obtain BMP/CBC in one week   Home Health:No Equipment/Devices:None  Discharge Condition:Stable CODE STATUS:Full Diet recommendation: Heart Healthy  Brief/Interim Summary: 58 y.o. female past medical history diabetes mellitus type 2 noncompliant with her medication, endometrial cancer who has not yet undergone staging plan for radiation, normal pressure hydrocephalus with a shunt, hyperlipidemia brought in from skilled nursing facility for confusion was found to be in DKA.   Discharge Diagnoses:  Principal Problem:   DKA (diabetic ketoacidosis) (HCC) Active Problems:   Acute kidney injury (HCC)   Hypotension   Acute hypoxic respiratory failure (HCC)   Acute encephalopathy  DKA/diabetes mellitus type 2: Likely due to noncompliance with her medication. Will start on IV insulin  her blood glucose improved. Lower extremity Doppler was negative for DVT. She was transition to long-acting Zopla sliding scale which we will continue as an outpatient.  SIRS in the setting of DKA: Infectious workup was unrevealing.  Acute kidney injury: Likely hemodynamically mediated, renal ultrasound showed cortical thickening. She started on IV fluids her creatinine returned to baseline.  Acute respiratory failure with hypoxia: Likely due to her acidosis did resolve with correction of her DKA.  Acute metabolic encephalopathy: Likely due to DKA.  Erythrocytosis: Likely hemoconcentrated return to baseline after IV fluid hydration.  History of endometrial cancer: Follow-up with GYN as an outpatient.  Normal pressure hydrocephalus with shunt: Noted.  Essential hypertension: No changes made to her  medication.  Hyperlipidemia: Continue statins.  Bipolar disorder: No changes made to her medication.Aaron Aas  Discharge Instructions  Discharge Instructions     Diet - low sodium heart healthy   Complete by: As directed    Discharge wound care:   Complete by: As directed    Per wound care instructions   Increase activity slowly   Complete by: As directed       Allergies as of 01/18/2024       Reactions   Erythromycin Other (See Comments)   Childhood reaction        Medication List     STOP taking these medications    megestrol  40 MG tablet Commonly known as: MEGACE        TAKE these medications    albuterol  108 (90 Base) MCG/ACT inhaler Commonly known as: VENTOLIN  HFA Inhale 2 puffs into the lungs See admin instructions. 2 puffs every 4-6 hours as needed for wheezing and SOB   atorvastatin  20 MG tablet Commonly known as: LIPITOR Take 1 tablet (20 mg total) by mouth daily.   Cholecalciferol  25 MCG (1000 UT) tablet Take 1,000 Units by mouth daily.   dapagliflozin propanediol 5 MG Tabs tablet Commonly known as: FARXIGA Take 5 mg by mouth in the morning and at bedtime.   divalproex  250 MG DR tablet Commonly known as: DEPAKOTE  Take 250 mg by mouth 3 (three) times daily.   docusate sodium  100 MG capsule Commonly known as: COLACE Take 1 capsule (100 mg total) by mouth 2 (two) times daily as needed for mild constipation.   guaiFENesin 100 MG/5ML liquid Commonly known as: ROBITUSSIN Take 5 mLs by mouth every 4 (four) hours as needed for cough or to loosen phlegm.   insulin  aspart 100 UNIT/ML injection Commonly known as: novoLOG  Inject 6 Units into the skin  3 (three) times daily with meals. What changed:  how much to take additional instructions   insulin  glargine-yfgn 100 UNIT/ML injection Commonly known as: SEMGLEE  Inject 0.2 mLs (20 Units total) into the skin daily. What changed:  how much to take when to take this   lamoTRIgine  25 MG  tablet Commonly known as: LAMICTAL  Take 50 mg by mouth 2 (two) times daily.   multivitamin with minerals tablet Take 1 tablet by mouth daily.   ondansetron  4 MG tablet Commonly known as: ZOFRAN  Take 4 mg by mouth every 6 (six) hours as needed for nausea or vomiting.   pantoprazole  40 MG tablet Commonly known as: PROTONIX  Take 40 mg by mouth daily.   polyethylene glycol 17 g packet Commonly known as: MIRALAX  / GLYCOLAX  Take 17 g by mouth daily. What changed:  when to take this reasons to take this               Discharge Care Instructions  (From admission, onward)           Start     Ordered   01/18/24 0000  Discharge wound care:       Comments: Per wound care instructions   01/18/24 0757            Allergies  Allergen Reactions   Erythromycin Other (See Comments)    Childhood reaction    Consultations: None   Procedures/Studies: VAS US  UPPER EXTREMITY VENOUS DUPLEX Result Date: 01/15/2024 UPPER VENOUS STUDY  Patient Name:  Carolyn Norton  Date of Exam:   01/15/2024 Medical Rec #: [id number]           Accession #:    [id number] Date of Birth: [birth year]          Patient Gender: F Patient Age:   82 years Exam Location:  [facility] Procedure:      VAS US  UPPER EXTREMITY VENOUS DUPLEX Referring Phys: Corky Diener RATHORE --------------------------------------------------------------------------------  Indications: Left upper extremity pain, swelling, erythema status post IV infiltration Limitations: Poor ultrasound/tissue interface and patient unable to extend and rotate arm. Comparison Study: No prior study Performing Technologist: Carleene Chase RVS  Examination Guidelines: A complete evaluation includes B-mode imaging, spectral Doppler, color Doppler, and power Doppler as needed of all accessible portions of each vessel. Bilateral testing is considered an integral part of a complete examination. Limited examinations for reoccurring indications may be  performed as noted.  Right Findings: +----------+------------+---------+-----------+----------+-------+ RIGHT     CompressiblePhasicitySpontaneousPropertiesSummary +----------+------------+---------+-----------+----------+-------+ Subclavian               Yes       Yes                      +----------+------------+---------+-----------+----------+-------+  Left Findings: +----------+------------+---------+-----------+----------+-------+ LEFT      CompressiblePhasicitySpontaneousPropertiesSummary +----------+------------+---------+-----------+----------+-------+ IJV           Full       Yes       Yes                      +----------+------------+---------+-----------+----------+-------+ Subclavian    Full       Yes       Yes                      +----------+------------+---------+-----------+----------+-------+ Axillary      Full       Yes       Yes                      +----------+------------+---------+-----------+----------+-------+  Brachial      Full                                          +----------+------------+---------+-----------+----------+-------+ Radial        Full                                          +----------+------------+---------+-----------+----------+-------+ Ulnar         Full                                          +----------+------------+---------+-----------+----------+-------+ Cephalic      Full                                          +----------+------------+---------+-----------+----------+-------+ Basilic       Full                                          +----------+------------+---------+-----------+----------+-------+  Summary:  Right: No evidence of superficial vein thrombosis in the upper extremity.  Left: No evidence of deep vein thrombosis in the upper extremity. No evidence of superficial vein thrombosis in the upper extremity.  *See table(s) above for measurements and observations.  Diagnosing  physician: Angela Kell MD Electronically signed by Angela Kell MD on 01/15/2024 at 8:04:16 PM.    Final    CT HEAD CODE STROKE WO CONTRAST` Result Date: 01/15/2024 CLINICAL DATA:  Code stroke. EXAM: CT HEAD WITHOUT CONTRAST TECHNIQUE: Contiguous axial images were obtained from the base of the skull through the vertex without intravenous contrast. RADIATION DOSE REDUCTION: This exam was performed according to the departmental dose-optimization program which includes automated exposure control, adjustment of the mA and/or kV according to patient size and/or use of iterative reconstruction technique. COMPARISON:  Prior study from 12/24/2023 FINDINGS: Brain: Atrophy with moderate chronic microvascular ischemic disease. Chronic right frontal encephalomalacia. Right parietal approach shunt catheter in place with tip terminating at the septum pellucidum. Stable ventricular size and morphology without progressive hydrocephalus. No acute intracranial hemorrhage. No acute large vessel territory infarct. No mass lesion or midline shift. No extra-axial fluid collection. Vascular: No abnormal hyperdense vessel. Scattered vascular calcifications noted within the carotid siphons. Skull: Scalp soft tissues demonstrate no acute finding. Calvarium intact. Sinuses/Orbits: Globes and orbital soft tissues within normal limits. Paranasal sinuses are largely clear. No mastoid effusion. Other: None. ASPECTS [facility] Stroke Program Early CT Score) - Ganglionic level infarction (caudate, lentiform nuclei, internal capsule, insula, M1-M3 cortex): 7 - Supraganglionic infarction (M4-M6 cortex): 3 Total score (0-10 with 10 being normal): 10 IMPRESSION: 1. No acute intracranial abnormality. 2. ASPECTS is 10. 3. Right parietal approach VP shunt catheter in place. Stable ventricular size and morphology without progressive hydrocephalus. 4. Underlying atrophy with chronic small vessel ischemic disease, with chronic right frontal  encephalomalacia. These results were communicated to choose for at 1:08 am on 01/15/2024 by text page via the [facility] messaging system. Electronically Signed   By: Elenor Griffith.D.  On: 01/15/2024 01:09   US  RENAL Result Date: 01/14/2024 CLINICAL DATA:  Acute kidney injury EXAM: RENAL / URINARY TRACT ULTRASOUND COMPLETE COMPARISON:  None Available. FINDINGS: Right Kidney: Renal measurements: 9.2 x 4.2 x 4.1 cm = volume: 82 mL. Somewhat small volume but no renal cortical thinning. Echogenicity within normal limits. No mass or hydronephrosis visualized. Left Kidney: Renal measurements: 9.6 x 4.5 x 4.5 cm = volume: 102 mL. Echogenicity within normal limits. No mass or hydronephrosis visualized. Bladder: Appears normal for degree of bladder distention. IMPRESSION: No hydronephrosis, mass, or cortical thinning. Electronically Signed   By: Ronnette Coke M.D.   On: 01/14/2024 05:24   DG Chest Port 1 View Result Date: 01/13/2024 CLINICAL DATA:  Shortness of breath and hyperglycemia, altered mental status EXAM: PORTABLE CHEST 1 VIEW COMPARISON:  12/23/2023 FINDINGS: Stable cardiomediastinal silhouette. Aortic atherosclerotic calcification. Left basilar and right mid lung atelectasis. Low lung volumes accentuate pulmonary vascularity. No pleural effusion or pneumothorax. Partially visualized right chest VP shunt. IMPRESSION: Low lung volumes with bilateral atelectasis. Electronically Signed   By: Rozell Cornet M.D.   On: 01/13/2024 22:40   CT HEAD WO CONTRAST ( ) Result Date: 12/24/2023 CLINICAL DATA:  58 year old female with altered mental status. History of NPH and shunt. EXAM: CT HEAD WITHOUT CONTRAST TECHNIQUE: Contiguous axial images were obtained from the base of the skull through the vertex without intravenous contrast. RADIATION DOSE REDUCTION: This exam was performed according to the departmental dose-optimization program which includes automated exposure control, adjustment of the mA and/or kV  according to patient size and/or use of iterative reconstruction technique. COMPARISON:  Brain MRI 04/01/2021, head CT 10/06/2023. FINDINGS: Brain: Right posterior approach ventriculostomy catheter is stable since January, communicates with the atrium of the right lateral ventricle on coronal image 41. Terminates in the midline as before. Stable ventricle size and configuration. No midline shift, mass effect, or evidence of intracranial mass lesion. No acute intracranial hemorrhage identified. Stable chronic right anterior superior frontal gyrus encephalomalacia, patchy bilateral white matter hypodensity. No cortically based acute infarct identified. Vascular: No suspicious intracranial vascular hyperdensity. Calcified atherosclerosis at the skull base. Skull: Stable.  No acute osseous abnormality identified. Sinuses/Orbits: Visualized paranasal sinuses and mastoids are stable and well aerated. Other: Right posterior convexity shunt reservoir and tubing appears stable with no adverse features. Stable orbits. IMPRESSION: Stable non contrast CT appearance of the brain, including right posterior approach ventriculostomy shunt with no adverse features. Electronically Signed   By: Marlise Simpers M.D.   On: 12/24/2023 11:15   DG CHEST PORT 1 VIEW Result Date: 12/23/2023 CLINICAL DATA:  Central line placement EXAM: PORTABLE CHEST 1 VIEW COMPARISON:  12/23/2023 FINDINGS: Right internal jugular central venous catheter tip is seen within the right atrium roughly 5 cm below the superior cavoatrial junction. Endotracheal tube seen 2.7 cm above the carina. Nasogastric tube tip overlies the proximal body of the stomach. Lung volumes are small, but are stable since prior examination. Lungs are clear. No pneumothorax or pleural effusion. Cardiac size within normal limits. No acute bone abnormality. IMPRESSION: 1. Right internal jugular central venous catheter tip within the right atrium roughly 5 cm below the superior cavoatrial  junction. No pneumothorax. Electronically Signed   By: Worthy Heads M.D.   On: 12/23/2023 20:30   DG Chest 1 View Result Date: 12/23/2023 CLINICAL DATA:  Shortness of breath EXAM: CHEST  1 VIEW COMPARISON:  Chest x-ray 10/30/2023 FINDINGS: Endotracheal tube tip is 2.6 cm above the carina. Enteric tube tip is  in the body of the stomach. The cardiomediastinal silhouette is within normal limits. There is a small band of atelectasis in the right mid lung. The lungs are otherwise clear. There is no pleural effusion or pneumothorax. Healed right-sided rib fractures are seen. IMPRESSION: 1. Endotracheal tube tip is 2.6 cm above the carina. 2. Enteric tube tip is in the body of the stomach. 3. Small band of atelectasis in the right mid lung. Electronically Signed   By: Tyron Gallon M.D.   On: 12/23/2023 14:41   (Echo, Carotid, EGD, Colonoscopy, ERCP)    Subjective: No complaints feels great.  Discharge Exam: Vitals:   01/18/24 0304 01/18/24 0721  BP: 100/67 124/70  Pulse: 79 80  Resp: 20 14  Temp: 97.7 F (36.5 C) 98.2 F (36.8 C)  SpO2: 98% 99%   Vitals:   01/17/24 1928 01/17/24 2312 01/18/24 0304 01/18/24 0721  BP: (!) 108/55 119/64 100/67 124/70  Pulse: 88 85 79 80  Resp: 13 14 20 14   Temp: 98.2 F (36.8 C) 97.7 F (36.5 C) 97.7 F (36.5 C) 98.2 F (36.8 C)  TempSrc: Oral Oral Oral Oral  SpO2: 100% 99% 98% 99%  Weight:        General: Pt is alert, awake, not in acute distress Cardiovascular: RRR, S1/S2 +, no rubs, no gallops Respiratory: CTA bilaterally, no wheezing, no rhonchi Abdominal: Soft, NT, ND, bowel sounds + Extremities: no edema, no cyanosis    The results of significant diagnostics from this hospitalization (including imaging, microbiology, ancillary and laboratory) are listed below for reference.     Microbiology: Recent Results (from the past 240 hours)  Culture, blood (Routine X 2) w Reflex to ID Panel     Status: None (Preliminary result)   Collection  Time: 01/14/24  3:44 AM   Specimen: BLOOD  Result Value Ref Range Status   Specimen Description BLOOD RIGHT ANTECUBITAL  Final   Special Requests   Final    BOTTLES DRAWN AEROBIC AND ANAEROBIC Blood Culture results may not be optimal due to an inadequate volume of blood received in culture bottles   Culture   Final    NO GROWTH 3 DAYS Performed at [facility], [address], [place], [place] [address]    Report Status PENDING  Incomplete     Labs: BNP (last 3 results) No results for input(s): "BNP" in the last 8760 hours. Basic Metabolic Panel: Recent Labs  Lab 01/14/24 0400 01/14/24 0841 01/14/24 1219 01/15/24 0937 01/17/24 0353  NA 142 142 144 144 138  K 3.8 4.3 4.6 3.5 3.4*  CL 103 102 105 108 105  CO2 14* 20* 20* 24 23  GLUCOSE 299* 153* 174* 147* 76  BUN 27* 25* 25* 17 12  CREATININE 2.56* 2.03* 1.92* 1.21* 0.98  CALCIUM  9.3 8.9 8.9 8.7* 8.2*  MG 1.8  --   --   --   --   PHOS 2.4*  --   --   --   --    Liver Function Tests: Recent Labs  Lab 01/14/24 0400  AST 23  ALT 13  ALKPHOS 55  BILITOT 2.2*  PROT 6.0*  ALBUMIN 2.8*   No results for input(s): "LIPASE", "AMYLASE" in the last 168 hours. No results for input(s): "AMMONIA" in the last 168 hours. CBC: Recent Labs  Lab 01/14/24 0003 01/14/24 0034 01/14/24 0313 01/14/24 0400 01/15/24 0937  WBC 16.3*  --   --  14.8* 12.6*  NEUTROABS 13.1*  --   --   --   --  HGB 14.6 16.0* 7.1* 12.9 12.8  HCT 48.8* 47.0* 21.0* 40.4 40.4  MCV 96.1  --   --  89.6 90.0  PLT 357  --   --  276 249   Cardiac Enzymes: No results for input(s): "CKTOTAL", "CKMB", "CKMBINDEX", "TROPONINI" in the last 168 hours. BNP: Invalid input(s): "POCBNP" CBG: Recent Labs  Lab 01/17/24 0559 01/17/24 1159 01/17/24 1645 01/17/24 2104 01/18/24 0615  GLUCAP 111* 203* 152* 220* 111*   D-Dimer No results for input(s): "DDIMER" in the last 72 hours. Hgb A1c No results for input(s): "HGBA1C" in the last 72 hours. Lipid  Profile No results for input(s): "CHOL", "HDL", "LDLCALC", "TRIG", "CHOLHDL", "LDLDIRECT" in the last 72 hours. Thyroid function studies No results for input(s): "TSH", "T4TOTAL", "T3FREE", "THYROIDAB" in the last 72 hours.  Invalid input(s): "FREET3" Anemia work up No results for input(s): "VITAMINB12", "FOLATE", "FERRITIN", "TIBC", "IRON", "RETICCTPCT" in the last 72 hours. Urinalysis    Component Value Date/Time   COLORURINE YELLOW 01/14/2024 0514   APPEARANCEUR CLEAR 01/14/2024 0514   LABSPEC 1.022 01/14/2024 0514   PHURINE 5.0 01/14/2024 0514   GLUCOSEU >=500 (A) 01/14/2024 0514   HGBUR SMALL (A) 01/14/2024 0514   BILIRUBINUR NEGATIVE 01/14/2024 0514   KETONESUR 80 (A) 01/14/2024 0514   PROTEINUR NEGATIVE 01/14/2024 0514   UROBILINOGEN 0.2 07/20/2014 2022   NITRITE NEGATIVE 01/14/2024 0514   LEUKOCYTESUR NEGATIVE 01/14/2024 0514   Sepsis Labs Recent Labs  Lab 01/14/24 0003 01/14/24 0400 01/15/24 0937  WBC 16.3* 14.8* 12.6*   Microbiology Recent Results (from the past 240 hours)  Culture, blood (Routine X 2) w Reflex to ID Panel     Status: None (Preliminary result)   Collection Time: 01/14/24  3:44 AM   Specimen: BLOOD  Result Value Ref Range Status   Specimen Description BLOOD RIGHT ANTECUBITAL  Final   Special Requests   Final    BOTTLES DRAWN AEROBIC AND ANAEROBIC Blood Culture results may not be optimal due to an inadequate volume of blood received in culture bottles   Culture   Final    NO GROWTH 3 DAYS Performed at [facility], [address], [place], [place] [address]    Report Status PENDING  Incomplete     Time coordinating discharge: Over 35 minutes  SIGNED:   Macdonald Savoy, MD  [facility] 01/18/2024, 7:57 AM Pager   If 7PM-7AM, please contact night-coverage [email or web address] Password [email or web address]

## 2024-01-19 ENCOUNTER — Ambulatory Visit
Admission: RE | Admit: 2024-01-19 | Discharge: 2024-01-19 | Disposition: A | Discharge status: Home / Self Care | Source: Ambulatory Visit | Attending: Radiation Oncology | Admitting: Radiation Oncology

## 2024-01-19 DIAGNOSIS — C541 Malignant neoplasm of endometrium: Secondary | ICD-10-CM | POA: Insufficient documentation

## 2024-01-19 DIAGNOSIS — Z510 Encounter for antineoplastic radiation therapy: Secondary | ICD-10-CM | POA: Insufficient documentation

## 2024-01-19 LAB — CULTURE, BLOOD (ROUTINE X 2): Culture: NO GROWTH

## 2024-01-24 DIAGNOSIS — Z510 Encounter for antineoplastic radiation therapy: Secondary | ICD-10-CM | POA: Diagnosis not present

## 2024-01-27 ENCOUNTER — Other Ambulatory Visit: Payer: Self-pay

## 2024-01-27 ENCOUNTER — Ambulatory Visit
Admission: RE | Admit: 2024-01-27 | Discharge: 2024-01-27 | Disposition: A | Discharge status: Home / Self Care | Source: Ambulatory Visit | Attending: Radiation Oncology | Admitting: Radiation Oncology

## 2024-01-27 DIAGNOSIS — Z510 Encounter for antineoplastic radiation therapy: Secondary | ICD-10-CM | POA: Diagnosis not present

## 2024-01-27 DIAGNOSIS — C541 Malignant neoplasm of endometrium: Secondary | ICD-10-CM

## 2024-01-27 LAB — RAD ONC ARIA SESSION SUMMARY
Course Elapsed Days: 0
Plan Fractions Treated to Date: 1
Plan Prescribed Dose Per Fraction: 1.8 Gy
Plan Total Fractions Prescribed: 25
Plan Total Prescribed Dose: 45 Gy
Reference Point Dosage Given to Date: 1.8 Gy
Reference Point Session Dosage Given: 1.8 Gy
Session Number: 1

## 2024-01-28 ENCOUNTER — Other Ambulatory Visit: Payer: Self-pay

## 2024-01-28 ENCOUNTER — Ambulatory Visit
Admission: RE | Admit: 2024-01-28 | Discharge: 2024-01-28 | Disposition: A | Discharge status: Home / Self Care | Source: Ambulatory Visit | Attending: Radiation Oncology | Admitting: Radiation Oncology

## 2024-01-28 DIAGNOSIS — Z510 Encounter for antineoplastic radiation therapy: Secondary | ICD-10-CM | POA: Diagnosis not present

## 2024-01-28 LAB — RAD ONC ARIA SESSION SUMMARY
Course Elapsed Days: 1
Plan Fractions Treated to Date: 2
Plan Prescribed Dose Per Fraction: 1.8 Gy
Plan Total Fractions Prescribed: 25
Plan Total Prescribed Dose: 45 Gy
Reference Point Dosage Given to Date: 3.6 Gy
Reference Point Session Dosage Given: 1.8 Gy
Session Number: 2

## 2024-02-01 ENCOUNTER — Emergency Department (HOSPITAL_COMMUNITY)

## 2024-02-01 ENCOUNTER — Inpatient Hospital Stay (HOSPITAL_COMMUNITY)
Admission: EM | Admit: 2024-02-01 | Discharge: 2024-02-05 | DRG: 871 | Disposition: A | Discharge status: Other Acute Inpatient Hospital | Source: Skilled Nursing Facility | Attending: Internal Medicine | Admitting: Internal Medicine

## 2024-02-01 ENCOUNTER — Other Ambulatory Visit: Payer: Self-pay

## 2024-02-01 ENCOUNTER — Ambulatory Visit

## 2024-02-01 DIAGNOSIS — R21 Rash and other nonspecific skin eruption: Secondary | ICD-10-CM | POA: Diagnosis present

## 2024-02-01 DIAGNOSIS — E1110 Type 2 diabetes mellitus with ketoacidosis without coma: Secondary | ICD-10-CM | POA: Diagnosis present

## 2024-02-01 DIAGNOSIS — A419 Sepsis, unspecified organism: Principal | ICD-10-CM | POA: Diagnosis present

## 2024-02-01 DIAGNOSIS — F0283 Dementia in other diseases classified elsewhere, unspecified severity, with mood disturbance: Secondary | ICD-10-CM | POA: Diagnosis present

## 2024-02-01 DIAGNOSIS — F0284 Dementia in other diseases classified elsewhere, unspecified severity, with anxiety: Secondary | ICD-10-CM | POA: Diagnosis present

## 2024-02-01 DIAGNOSIS — G912 (Idiopathic) normal pressure hydrocephalus: Secondary | ICD-10-CM | POA: Diagnosis present

## 2024-02-01 DIAGNOSIS — R4182 Altered mental status, unspecified: Secondary | ICD-10-CM | POA: Diagnosis present

## 2024-02-01 DIAGNOSIS — E860 Dehydration: Secondary | ICD-10-CM | POA: Diagnosis present

## 2024-02-01 DIAGNOSIS — Z881 Allergy status to other antibiotic agents status: Secondary | ICD-10-CM

## 2024-02-01 DIAGNOSIS — E669 Obesity, unspecified: Secondary | ICD-10-CM | POA: Diagnosis present

## 2024-02-01 DIAGNOSIS — E785 Hyperlipidemia, unspecified: Secondary | ICD-10-CM | POA: Diagnosis present

## 2024-02-01 DIAGNOSIS — Z961 Presence of intraocular lens: Secondary | ICD-10-CM | POA: Diagnosis present

## 2024-02-01 DIAGNOSIS — N179 Acute kidney failure, unspecified: Secondary | ICD-10-CM | POA: Diagnosis present

## 2024-02-01 DIAGNOSIS — Z6829 Body mass index (BMI) 29.0-29.9, adult: Secondary | ICD-10-CM

## 2024-02-01 DIAGNOSIS — G9389 Other specified disorders of brain: Secondary | ICD-10-CM | POA: Diagnosis present

## 2024-02-01 DIAGNOSIS — R6521 Severe sepsis with septic shock: Secondary | ICD-10-CM | POA: Diagnosis present

## 2024-02-01 DIAGNOSIS — F17210 Nicotine dependence, cigarettes, uncomplicated: Secondary | ICD-10-CM | POA: Diagnosis present

## 2024-02-01 DIAGNOSIS — Z1152 Encounter for screening for COVID-19: Secondary | ICD-10-CM | POA: Diagnosis not present

## 2024-02-01 DIAGNOSIS — N181 Chronic kidney disease, stage 1: Secondary | ICD-10-CM | POA: Diagnosis present

## 2024-02-01 DIAGNOSIS — Z982 Presence of cerebrospinal fluid drainage device: Secondary | ICD-10-CM

## 2024-02-01 DIAGNOSIS — R1312 Dysphagia, oropharyngeal phase: Secondary | ICD-10-CM | POA: Diagnosis not present

## 2024-02-01 DIAGNOSIS — Z7984 Long term (current) use of oral hypoglycemic drugs: Secondary | ICD-10-CM

## 2024-02-01 DIAGNOSIS — J45909 Unspecified asthma, uncomplicated: Secondary | ICD-10-CM | POA: Diagnosis present

## 2024-02-01 DIAGNOSIS — E1122 Type 2 diabetes mellitus with diabetic chronic kidney disease: Secondary | ICD-10-CM | POA: Diagnosis present

## 2024-02-01 DIAGNOSIS — F319 Bipolar disorder, unspecified: Secondary | ICD-10-CM | POA: Diagnosis present

## 2024-02-01 DIAGNOSIS — C541 Malignant neoplasm of endometrium: Secondary | ICD-10-CM | POA: Diagnosis present

## 2024-02-01 DIAGNOSIS — I129 Hypertensive chronic kidney disease with stage 1 through stage 4 chronic kidney disease, or unspecified chronic kidney disease: Secondary | ICD-10-CM | POA: Diagnosis present

## 2024-02-01 DIAGNOSIS — E7220 Disorder of urea cycle metabolism, unspecified: Secondary | ICD-10-CM | POA: Diagnosis present

## 2024-02-01 DIAGNOSIS — D649 Anemia, unspecified: Secondary | ICD-10-CM | POA: Diagnosis present

## 2024-02-01 DIAGNOSIS — B369 Superficial mycosis, unspecified: Secondary | ICD-10-CM | POA: Diagnosis not present

## 2024-02-01 DIAGNOSIS — R1310 Dysphagia, unspecified: Secondary | ICD-10-CM | POA: Diagnosis present

## 2024-02-01 DIAGNOSIS — Z803 Family history of malignant neoplasm of breast: Secondary | ICD-10-CM

## 2024-02-01 DIAGNOSIS — R6520 Severe sepsis without septic shock: Principal | ICD-10-CM

## 2024-02-01 DIAGNOSIS — G9341 Metabolic encephalopathy: Secondary | ICD-10-CM | POA: Diagnosis present

## 2024-02-01 DIAGNOSIS — Z79899 Other long term (current) drug therapy: Secondary | ICD-10-CM

## 2024-02-01 DIAGNOSIS — G939 Disorder of brain, unspecified: Secondary | ICD-10-CM | POA: Diagnosis present

## 2024-02-01 DIAGNOSIS — E876 Hypokalemia: Secondary | ICD-10-CM | POA: Diagnosis present

## 2024-02-01 DIAGNOSIS — Z794 Long term (current) use of insulin: Secondary | ICD-10-CM | POA: Diagnosis not present

## 2024-02-01 DIAGNOSIS — G309 Alzheimer's disease, unspecified: Secondary | ICD-10-CM | POA: Diagnosis present

## 2024-02-01 DIAGNOSIS — Z8249 Family history of ischemic heart disease and other diseases of the circulatory system: Secondary | ICD-10-CM

## 2024-02-01 DIAGNOSIS — Z66 Do not resuscitate: Secondary | ICD-10-CM | POA: Diagnosis present

## 2024-02-01 DIAGNOSIS — Z5320 Procedure and treatment not carried out because of patient's decision for unspecified reasons: Secondary | ICD-10-CM | POA: Diagnosis not present

## 2024-02-01 DIAGNOSIS — Z9841 Cataract extraction status, right eye: Secondary | ICD-10-CM

## 2024-02-01 DIAGNOSIS — Z9842 Cataract extraction status, left eye: Secondary | ICD-10-CM

## 2024-02-01 LAB — CBC WITH DIFFERENTIAL/PLATELET
Abs Immature Granulocytes: 0.42 10*3/uL — ABNORMAL HIGH (ref 0.00–0.07)
Basophils Absolute: 0.2 10*3/uL — ABNORMAL HIGH (ref 0.0–0.1)
Basophils Relative: 1 %
Eosinophils Absolute: 0.1 10*3/uL (ref 0.0–0.5)
Eosinophils Relative: 1 %
HCT: 47.6 % — ABNORMAL HIGH (ref 36.0–46.0)
Hemoglobin: 13.4 g/dL (ref 12.0–15.0)
Immature Granulocytes: 3 %
Lymphocytes Relative: 25 %
Lymphs Abs: 3.6 10*3/uL (ref 0.7–4.0)
MCH: 28.9 pg (ref 26.0–34.0)
MCHC: 28.2 g/dL — ABNORMAL LOW (ref 30.0–36.0)
MCV: 102.6 fL — ABNORMAL HIGH (ref 80.0–100.0)
Monocytes Absolute: 0.6 10*3/uL (ref 0.1–1.0)
Monocytes Relative: 4 %
Neutro Abs: 9.4 10*3/uL — ABNORMAL HIGH (ref 1.7–7.7)
Neutrophils Relative %: 66 %
Platelets: 406 10*3/uL — ABNORMAL HIGH (ref 150–400)
RBC: 4.64 MIL/uL (ref 3.87–5.11)
RDW: 17.3 % — ABNORMAL HIGH (ref 11.5–15.5)
WBC: 14.3 10*3/uL — ABNORMAL HIGH (ref 4.0–10.5)
nRBC: 0 % (ref 0.0–0.2)

## 2024-02-01 LAB — URINALYSIS, W/ REFLEX TO CULTURE (INFECTION SUSPECTED)
Bilirubin Urine: NEGATIVE
Glucose, UA: >=500 mg/dL — AB
Ketones, ur: 80 mg/dL — AB
Nitrite: NEGATIVE
Protein, ur: 100 mg/dL — AB
Specific Gravity, Urine: 1.02 (ref 1.005–1.030)
pH: 5 (ref 5.0–8.0)

## 2024-02-01 LAB — BASIC METABOLIC PANEL WITH GFR
Anion gap: 34 — ABNORMAL HIGH (ref 5–15)
Anion gap: 27 — ABNORMAL HIGH (ref 5–15)
BUN: 22 mg/dL — ABNORMAL HIGH (ref 6–20)
BUN: 23 mg/dL — ABNORMAL HIGH (ref 6–20)
BUN: 24 mg/dL — ABNORMAL HIGH (ref 6–20)
CO2: <7 mmol/L — ABNORMAL LOW (ref 22–32)
CO2: 8 mmol/L — ABNORMAL LOW (ref 22–32)
CO2: 14 mmol/L — ABNORMAL LOW (ref 22–32)
Calcium: 9.3 mg/dL (ref 8.9–10.3)
Calcium: 9.1 mg/dL (ref 8.9–10.3)
Calcium: 8.7 mg/dL — ABNORMAL LOW (ref 8.9–10.3)
Chloride: 99 mmol/L (ref 98–111)
Chloride: 102 mmol/L (ref 98–111)
Chloride: 103 mmol/L (ref 98–111)
Creatinine, Ser: 2.12 mg/dL — ABNORMAL HIGH (ref 0.44–1.00)
Creatinine, Ser: 2.45 mg/dL — ABNORMAL HIGH (ref 0.44–1.00)
Creatinine, Ser: 2.16 mg/dL — ABNORMAL HIGH (ref 0.44–1.00)
GFR, Estimated: 27 mL/min — ABNORMAL LOW (ref >60)
GFR, Estimated: 23 mL/min — ABNORMAL LOW (ref >60)
GFR, Estimated: 26 mL/min — ABNORMAL LOW (ref >60)
Glucose, Bld: 516 mg/dL (ref 70–99)
Glucose, Bld: 303 mg/dL — ABNORMAL HIGH (ref 70–99)
Glucose, Bld: 212 mg/dL — ABNORMAL HIGH (ref 70–99)
Potassium: 4.9 mmol/L (ref 3.5–5.1)
Potassium: 4.3 mmol/L (ref 3.5–5.1)
Potassium: 3.9 mmol/L (ref 3.5–5.1)
Sodium: 142 mmol/L (ref 135–145)
Sodium: 144 mmol/L (ref 135–145)
Sodium: 144 mmol/L (ref 135–145)

## 2024-02-01 LAB — GLUCOSE, CAPILLARY
Glucose-Capillary: 181 mg/dL — ABNORMAL HIGH (ref 70–99)
Glucose-Capillary: 187 mg/dL — ABNORMAL HIGH (ref 70–99)
Glucose-Capillary: 195 mg/dL — ABNORMAL HIGH (ref 70–99)
Glucose-Capillary: 211 mg/dL — ABNORMAL HIGH (ref 70–99)
Glucose-Capillary: 212 mg/dL — ABNORMAL HIGH (ref 70–99)
Glucose-Capillary: 214 mg/dL — ABNORMAL HIGH (ref 70–99)
Glucose-Capillary: 256 mg/dL — ABNORMAL HIGH (ref 70–99)
Glucose-Capillary: 289 mg/dL — ABNORMAL HIGH (ref 70–99)
Glucose-Capillary: 347 mg/dL — ABNORMAL HIGH (ref 70–99)
Glucose-Capillary: 419 mg/dL — ABNORMAL HIGH (ref 70–99)
Glucose-Capillary: 478 mg/dL — ABNORMAL HIGH (ref 70–99)

## 2024-02-01 LAB — COMPREHENSIVE METABOLIC PANEL WITH GFR
ALT: 14 U/L (ref 0–44)
AST: 27 U/L (ref 15–41)
Albumin: 3.6 g/dL (ref 3.5–5.0)
Alkaline Phosphatase: 87 U/L (ref 38–126)
BUN: 20 mg/dL (ref 6–20)
CO2: <7 mmol/L — ABNORMAL LOW (ref 22–32)
Calcium: 10 mg/dL (ref 8.9–10.3)
Chloride: 97 mmol/L — ABNORMAL LOW (ref 98–111)
Creatinine, Ser: 2.03 mg/dL — ABNORMAL HIGH (ref 0.44–1.00)
GFR, Estimated: 28 mL/min — ABNORMAL LOW (ref >60)
Glucose, Bld: 438 mg/dL — ABNORMAL HIGH (ref 70–99)
Potassium: 5.1 mmol/L (ref 3.5–5.1)
Sodium: 141 mmol/L (ref 135–145)
Total Bilirubin: 1.7 mg/dL — ABNORMAL HIGH (ref 0.0–1.2)
Total Protein: 7.8 g/dL (ref 6.5–8.1)

## 2024-02-01 LAB — BLOOD GAS, VENOUS
Acid-base deficit: 26.7 mmol/L — ABNORMAL HIGH (ref 0.0–2.0)
Bicarbonate: 4.2 mmol/L — ABNORMAL LOW (ref 20.0–28.0)
O2 Saturation: 87.7 %
Patient temperature: 37
pCO2, Ven: 20 mmHg — ABNORMAL LOW (ref 44–60)
pH, Ven: <6.95 — CL (ref 7.25–7.43)
pO2, Ven: 68 mmHg — ABNORMAL HIGH (ref 32–45)

## 2024-02-01 LAB — I-STAT CG4 LACTIC ACID, ED
Lactic Acid, Venous: 8.9 mmol/L (ref 0.5–1.9)
Lactic Acid, Venous: 8.2 mmol/L (ref 0.5–1.9)

## 2024-02-01 LAB — BETA-HYDROXYBUTYRIC ACID
Beta-Hydroxybutyric Acid: >8 mmol/L — ABNORMAL HIGH (ref 0.05–0.27)
Beta-Hydroxybutyric Acid: 5.92 mmol/L — ABNORMAL HIGH (ref 0.05–0.27)

## 2024-02-01 LAB — PROTIME-INR
INR: 1.5 — ABNORMAL HIGH (ref 0.8–1.2)
Prothrombin Time: 18.3 s — ABNORMAL HIGH (ref 11.4–15.2)

## 2024-02-01 LAB — CBG MONITORING, ED
Glucose-Capillary: 392 mg/dL — ABNORMAL HIGH (ref 70–99)
Glucose-Capillary: 458 mg/dL — ABNORMAL HIGH (ref 70–99)
Glucose-Capillary: 505 mg/dL (ref 70–99)
Glucose-Capillary: 492 mg/dL — ABNORMAL HIGH (ref 70–99)

## 2024-02-01 LAB — RESP PANEL BY RT-PCR (RSV, FLU A&B, COVID)  RVPGX2
Influenza A by PCR: NEGATIVE
Influenza B by PCR: NEGATIVE
Resp Syncytial Virus by PCR: NEGATIVE
SARS Coronavirus 2 by RT PCR: NEGATIVE

## 2024-02-01 LAB — AMMONIA: Ammonia: 128 umol/L — ABNORMAL HIGH (ref 9–35)

## 2024-02-01 MED ORDER — PANTOPRAZOLE SODIUM 40 MG IV SOLR
40.0000 mg | INTRAVENOUS | Status: DC
  Administered 2024-02-01 – 2024-02-03 (×3): 40 mg via INTRAVENOUS
  Filled 2024-02-01 (×3): qty 10

## 2024-02-01 MED ORDER — METRONIDAZOLE 500 MG/100ML IV SOLN
500.0000 mg | Freq: Two times a day (BID) | INTRAVENOUS | Status: DC
  Administered 2024-02-01 – 2024-02-03 (×4): 500 mg via INTRAVENOUS
  Filled 2024-02-01 (×4): qty 100

## 2024-02-01 MED ORDER — VANCOMYCIN HCL IN DEXTROSE 1-5 GM/200ML-% IV SOLN: 1000.0000 mg | INTRAVENOUS | Status: DC

## 2024-02-01 MED ORDER — INSULIN REGULAR(HUMAN) IN NACL 100-0.9 UT/100ML-% IV SOLN
INTRAVENOUS | Status: DC
  Administered 2024-02-01: 10 [IU]/h via INTRAVENOUS
  Administered 2024-02-01: 5 [IU]/h via INTRAVENOUS
  Filled 2024-02-01 (×2): qty 100

## 2024-02-01 MED ORDER — ACETAMINOPHEN 650 MG RE SUPP
650.0000 mg | Freq: Once | RECTAL | Status: AC
  Administered 2024-02-01: 650 mg via RECTAL
  Filled 2024-02-01: qty 1

## 2024-02-01 MED ORDER — SODIUM CHLORIDE 0.9 % IV SOLN
2.0000 g | Freq: Two times a day (BID) | INTRAVENOUS | Status: DC
  Administered 2024-02-01 – 2024-02-03 (×4): 2 g via INTRAVENOUS
  Filled 2024-02-01 (×5): qty 12.5

## 2024-02-01 MED ORDER — NYSTATIN 100000 UNIT/GM EX POWD
1.0000 | Freq: Two times a day (BID) | CUTANEOUS | Status: DC
  Administered 2024-02-01 – 2024-02-05 (×9): 1 via TOPICAL
  Filled 2024-02-01: qty 15

## 2024-02-01 MED ORDER — LAMOTRIGINE 100 MG PO TABS
100.0000 mg | ORAL_TABLET | Freq: Two times a day (BID) | ORAL | Status: DC
  Administered 2024-02-01: 100 mg via ORAL
  Filled 2024-02-01: qty 1

## 2024-02-01 MED ORDER — ONDANSETRON HCL 4 MG PO TABS: 4.0000 mg | ORAL_TABLET | Freq: Four times a day (QID) | ORAL | Status: DC | PRN

## 2024-02-01 MED ORDER — DIVALPROEX SODIUM 500 MG PO DR TAB
500.0000 mg | DELAYED_RELEASE_TABLET | Freq: Three times a day (TID) | ORAL | Status: DC
  Administered 2024-02-01: 500 mg via ORAL
  Filled 2024-02-01 (×3): qty 1

## 2024-02-01 MED ORDER — VANCOMYCIN HCL IN DEXTROSE 1-5 GM/200ML-% IV SOLN: 1000.0000 mg | Freq: Once | INTRAVENOUS | Status: DC

## 2024-02-01 MED ORDER — STERILE WATER FOR INJECTION IV SOLN
INTRAVENOUS | Status: DC
  Filled 2024-02-01 (×2): qty 150
  Filled 2024-02-01: qty 1000

## 2024-02-01 MED ORDER — VANCOMYCIN HCL 1750 MG/350ML IV SOLN
1750.0000 mg | Freq: Once | INTRAVENOUS | Status: AC
  Administered 2024-02-01: 1750 mg via INTRAVENOUS
  Filled 2024-02-01: qty 350

## 2024-02-01 MED ORDER — CHLORHEXIDINE GLUCONATE CLOTH 2 % EX PADS
6.0000 | MEDICATED_PAD | Freq: Every day | CUTANEOUS | Status: DC
  Administered 2024-02-01 – 2024-02-04 (×4): 6 via TOPICAL

## 2024-02-01 MED ORDER — DEXTROSE 50 % IV SOLN: 0.0000 mL | INTRAVENOUS | Status: DC | PRN

## 2024-02-01 MED ORDER — LACTATED RINGERS IV BOLUS (SEPSIS)
500.0000 mL | Freq: Once | INTRAVENOUS | Status: AC
  Administered 2024-02-01: 500 mL via INTRAVENOUS

## 2024-02-01 MED ORDER — LACTATED RINGERS IV SOLN: INTRAVENOUS | Status: AC

## 2024-02-01 MED ORDER — LACTULOSE ENEMA
300.0000 mL | Freq: Two times a day (BID) | ORAL | Status: DC
  Administered 2024-02-01: 300 mL via RECTAL
  Filled 2024-02-01 (×2): qty 300

## 2024-02-01 MED ORDER — LACTATED RINGERS IV BOLUS (SEPSIS)
1000.0000 mL | Freq: Once | INTRAVENOUS | Status: AC
  Administered 2024-02-01: 1000 mL via INTRAVENOUS

## 2024-02-01 MED ORDER — ACETAMINOPHEN 325 MG PO TABS: 650.0000 mg | ORAL_TABLET | Freq: Four times a day (QID) | ORAL | Status: DC | PRN

## 2024-02-01 MED ORDER — SODIUM CHLORIDE 0.9 % IV SOLN
2.0000 g | Freq: Once | INTRAVENOUS | Status: AC
  Administered 2024-02-01: 2 g via INTRAVENOUS
  Filled 2024-02-01: qty 12.5

## 2024-02-01 MED ORDER — LACTATED RINGERS IV SOLN
INTRAVENOUS | Status: AC
Start: 2024-02-01 — End: 2024-02-02

## 2024-02-01 MED ORDER — ONDANSETRON HCL 4 MG/2ML IJ SOLN
4.0000 mg | Freq: Four times a day (QID) | INTRAMUSCULAR | Status: DC | PRN
Start: 2024-02-01 — End: 2024-02-06

## 2024-02-01 MED ORDER — ACETAMINOPHEN 650 MG RE SUPP: 650.0000 mg | Freq: Four times a day (QID) | RECTAL | Status: DC | PRN

## 2024-02-01 MED ORDER — METRONIDAZOLE 500 MG/100ML IV SOLN
500.0000 mg | Freq: Once | INTRAVENOUS | Status: AC
  Administered 2024-02-01: 500 mg via INTRAVENOUS
  Filled 2024-02-01: qty 100

## 2024-02-01 MED ORDER — ATORVASTATIN CALCIUM 10 MG PO TABS
20.0000 mg | ORAL_TABLET | Freq: Every day | ORAL | Status: DC
  Administered 2024-02-02 – 2024-02-05 (×4): 20 mg via ORAL
  Filled 2024-02-01 (×3): qty 1
  Filled 2024-02-01: qty 2

## 2024-02-01 MED ORDER — ZINC OXIDE 20 % EX OINT: 1.0000 | TOPICAL_OINTMENT | Freq: Three times a day (TID) | CUTANEOUS | Status: DC | PRN

## 2024-02-01 MED ORDER — ALBUTEROL SULFATE (2.5 MG/3ML) 0.083% IN NEBU: 2.5000 mg | INHALATION_SOLUTION | RESPIRATORY_TRACT | Status: DC | PRN

## 2024-02-01 MED ORDER — HEPARIN SODIUM (PORCINE) 5000 UNIT/ML IJ SOLN
5000.0000 [IU] | Freq: Three times a day (TID) | INTRAMUSCULAR | Status: DC
  Administered 2024-02-01 – 2024-02-05 (×13): 5000 [IU] via SUBCUTANEOUS
  Filled 2024-02-01 (×13): qty 1

## 2024-02-01 MED ORDER — DEXTROSE IN LACTATED RINGERS 5 % IV SOLN
INTRAVENOUS | Status: AC
Start: 2024-02-01 — End: 2024-02-02

## 2024-02-01 NOTE — Sepsis Progress Note (Signed)
Notified bedside nurse of need to draw and administer antibiotics, lactic acid, and blood cultures.  

## 2024-02-01 NOTE — Progress Notes (Signed)
 Pharmacy Antibiotic Note  Carolyn Norton is a 58 y.o. female admitted on 02/01/2024 with sepsis.  Pharmacy has been consulted for vanc/cefepime dosing.  Plan: Vanc 1750mg  IV x 1 already given. Start vanc 1g IV q36 thereafter - goal AUC 400-550 Cefepime 2g IV q12 for CrCl 30-50, will adjust as needed for renal function  Weight: 77.1 kg (170 lb)  Temp (24hrs), Avg:103 F (39.4 C), Min:103 F (39.4 C), Max:103 F (39.4 C)  Recent Labs  Lab 02/01/24 0930 02/01/24 0950 02/01/24 1009 02/01/24 1118 02/01/24 1237  WBC  --   --  14.3*  --   --   CREATININE 2.03*  --   --  2.12*  --   LATICACIDVEN  --  8.9*  --   --  8.2*    Estimated Creatinine Clearance: 29.8 mL/min (A) (by C-G formula based on SCr of 2.12 mg/dL (H)).    Allergies  Allergen Reactions   Erythromycin Other (See Comments)    Childhood reaction      Thank you for allowing pharmacy to be a part of this patient's care.  Bernett Brill 02/01/2024 12:51 PM

## 2024-02-01 NOTE — Sepsis Progress Note (Signed)
 Elink monitoring for the code sepsis protocol.

## 2024-02-01 NOTE — Sepsis Progress Note (Signed)
 Notified bedside nurse of need to draw repeat lactic acid.

## 2024-02-01 NOTE — Progress Notes (Signed)
 RN called to put pt on HFNC per MD request.  Pt was on a NRB with sats of 100%.  Pt changed to 8L HFNC with sats of 100%. RR 20, Breath sounds clear. HR 115.  PT is a DNR/DNI.

## 2024-02-01 NOTE — ED Provider Notes (Signed)
 [place] [facility]PARTMENT AT [facility] Provider Note   CSN: [id number] Arrival date & time: 02/01/24  0846     History  Chief Complaint  Patient presents with   Altered Mental Status    Carolyn Norton is a 58 y.o. female.  58 yo F with a chief complaints of altered mental status difficulty breathing and high blood sugars.  Limited history with EMS.  Reportedly patient with worsening difficulty breathing, blood sugar greater than 500.  EMS was called out to scene.  Patient with past pulse ox they could get was 88% on nonrebreather with nasal cannula at 6 L a minute.  Patient was then brought here for evaluation.  I did obtain further history from the family.  Patient had reportedly not been doing well since about January.  Multiple visits to the emergency department for hyperglycemia and altered mental status.  Thought to be secondary to patient refusal.  She has been in a nursing facility and has been known to refuse her medications and the facility will not give them to her against her will.  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  Did have some confusion before the shunt was placed and had some significant improvements.   Altered Mental Status      Home Medications Prior to Admission medications   Medication Sig Start Date End Date Taking? Authorizing Provider  albuterol  (VENTOLIN  HFA) 108 (90 Base) MCG/ACT inhaler Inhale 2 puffs into the lungs every 6 (six) hours as needed for wheezing or shortness of breath.   Yes [provider]  atorvastatin  (LIPITOR) 20 MG tablet Take 1 tablet (20 mg total) by mouth daily. 04/29/21  Yes Lovell Rubenstein, NP  dapagliflozin propanediol (FARXIGA) 5 MG TABS tablet Take 5 mg by mouth in the morning and at bedtime. 11/18/23  Yes [provider]  divalproex  (DEPAKOTE ) 500 MG DR tablet Take 500 mg by mouth 3 (three) times daily.   Yes [provider]  docusate sodium   (COLACE) 100 MG capsule Take 1 capsule (100 mg total) by mouth 2 (two) times daily as needed for mild constipation. Patient taking differently: Take 100 mg by mouth 2 (two) times daily. 12/27/23  Yes Barbee Lew, MD  guaiFENesin (ROBITUSSIN) 100 MG/5ML liquid Take 5 mLs by mouth every 4 (four) hours as needed for cough or to loosen phlegm.   Yes [provider]  insulin  aspart (NOVOLOG ) 100 UNIT/ML injection Inject 6 Units into the skin 3 (three) times daily with meals. 01/18/24  Yes Macdonald Savoy, MD  insulin  glargine (LANTUS ) 100 unit/mL SOPN Inject 20 Units into the skin daily.   Yes [provider]  lamoTRIgine  (LAMICTAL ) 100 MG tablet Take 100 mg by mouth 2 (two) times daily.   Yes [provider]  Multiple Vitamins-Minerals (MULTIVITAMIN WITH MINERALS) tablet Take 1 tablet by mouth daily.   Yes [provider]  nystatin powder Apply 1 Application topically 2 (two) times daily. Apply to groin   Yes [provider]  ondansetron  (ZOFRAN ) 4 MG tablet Take 4 mg by mouth every 6 (six) hours as needed for nausea or vomiting.   Yes [provider]  pantoprazole  (PROTONIX ) 40 MG tablet Take 40 mg by mouth daily.   Yes [provider]  polyethylene glycol (MIRALAX  / GLYCOLAX ) 17 g packet Take 17 g by mouth daily. 04/30/21  Yes Lovell Rubenstein, NP  zinc  oxide 20 % ointment Apply 1 Application  topically in the morning and at bedtime. Apply to groin, buttocks, and vulva   Yes [provider]  Cholecalciferol  25 MCG (1000 UT) tablet Take 1,000 Units by mouth daily. Patient not taking: Reported on 02/01/2024    [provider]      Allergies    Erythromycin    Review of Systems   Review of Systems  Physical Exam Updated Vital Signs BP 95/70   Pulse (!) 125   Temp (!) 93 F (33.9 C)   Resp 20   Wt 77.1 kg   LMP 01/26/2017 (Approximate)   SpO2 100%   BMI 29.18 kg/m  Physical Exam Vitals and nursing  note reviewed.  Constitutional:      General: She is in acute distress.     Appearance: She is well-developed. She is not diaphoretic.  HENT:     Head: Normocephalic and atraumatic.  Eyes:     Pupils: Pupils are equal, round, and reactive to light.  Cardiovascular:     Rate and Rhythm: Normal rate and regular rhythm.     Heart sounds: No murmur heard.    No friction rub. No gallop.  Pulmonary:     Effort: Pulmonary effort is normal.     Breath sounds: No wheezing or rales.  Abdominal:     General: There is no distension.     Palpations: Abdomen is soft.     Tenderness: There is no abdominal tenderness.  Musculoskeletal:        General: No tenderness.     Cervical back: Normal range of motion and neck supple.  Skin:    General: Skin is warm and dry.     ED Results / Procedures / Treatments   Labs (all labs ordered are listed, but only abnormal results are displayed) Labs Reviewed  COMPREHENSIVE METABOLIC PANEL WITH GFR - Abnormal; Notable for the following components:      Result Value   Chloride 97 (*)    CO2 <7 (*)    Glucose, Bld 438 (*)    Creatinine, Ser 2.03 (*)    Total Bilirubin 1.7 (*)    GFR, Estimated 28 (*)    All other components within normal limits  BLOOD GAS, VENOUS - Abnormal; Notable for the following components:   pH, Ven <6.95 (*)    pCO2, Ven 20 (*)    pO2, Ven 68 (*)    Bicarbonate 4.2 (*)    Acid-base deficit 26.7 (*)    All other components within normal limits  CBC WITH DIFFERENTIAL/PLATELET - Abnormal; Notable for the following components:   WBC 14.3 (*)    HCT 47.6 (*)    MCV 102.6 (*)    MCHC 28.2 (*)    RDW 17.3 (*)    Platelets 406 (*)    Neutro Abs 9.4 (*)    Basophils Absolute 0.2 (*)    Abs Immature Granulocytes 0.42 (*)    All other components within normal limits  PROTIME-INR - Abnormal; Notable for the following components:   Prothrombin Time 18.3 (*)    INR 1.5 (*)    All other components within normal limits  AMMONIA -  Abnormal; Notable for the following components:   Ammonia 128 (*)    All other components within normal limits  BASIC METABOLIC PANEL WITH GFR - Abnormal; Notable for the following components:   CO2 <7 (*)    Glucose, Bld 516 (*)    BUN 22 (*)    Creatinine, Ser 2.12 (*)  GFR, Estimated 27 (*)    All other components within normal limits  BETA-HYDROXYBUTYRIC ACID - Abnormal; Notable for the following components:   Beta-Hydroxybutyric Acid >8.00 (*)    All other components within normal limits  GLUCOSE, CAPILLARY - Abnormal; Notable for the following components:   Glucose-Capillary 478 (*)    All other components within normal limits  I-STAT CG4 LACTIC ACID, ED - Abnormal; Notable for the following components:   Lactic Acid, Venous 8.9 (*)    All other components within normal limits  CBG MONITORING, ED - Abnormal; Notable for the following components:   Glucose-Capillary 392 (*)    All other components within normal limits  I-STAT CG4 LACTIC ACID, ED - Abnormal; Notable for the following components:   Lactic Acid, Venous 8.2 (*)    All other components within normal limits  CBG MONITORING, ED - Abnormal; Notable for the following components:   Glucose-Capillary 458 (*)    All other components within normal limits  CBG MONITORING, ED - Abnormal; Notable for the following components:   Glucose-Capillary 505 (*)    All other components within normal limits  CBG MONITORING, ED - Abnormal; Notable for the following components:   Glucose-Capillary 492 (*)    All other components within normal limits  RESP PANEL BY RT-PCR (RSV, FLU A&B, COVID)  [id number]  CULTURE, BLOOD (ROUTINE X 2)  CULTURE, BLOOD (ROUTINE X 2)  CBC WITH DIFFERENTIAL/PLATELET  URINALYSIS, W/ REFLEX TO CULTURE (INFECTION SUSPECTED)  BASIC METABOLIC PANEL WITH GFR  BASIC METABOLIC PANEL WITH GFR  BASIC METABOLIC PANEL WITH GFR  BETA-HYDROXYBUTYRIC ACID  BETA-HYDROXYBUTYRIC ACID  BETA-HYDROXYBUTYRIC ACID   URINALYSIS, ROUTINE W REFLEX MICROSCOPIC  BASIC METABOLIC PANEL WITH GFR  BASIC METABOLIC PANEL WITH GFR  BASIC METABOLIC PANEL WITH GFR  BASIC METABOLIC PANEL WITH GFR    EKG EKG Interpretation Date/Time:  Tuesday Feb 01 2024 09:58:12 EDT Ventricular Rate:  119 PR Interval:  159 QRS Duration:  107 QT Interval:  320 QTC Calculation: 451 R Axis:   -4  Text Interpretation: Sinus tachycardia Low voltage, precordial leads Repol abnrm suggests ischemia, lateral leads No significant change since last tracing Confirmed by Albertus Hughs [phone]) on 02/01/2024 10:08:46 AM  Radiology DG Chest Port 1 View Result Date: 02/01/2024 CLINICAL DATA:  Possible sepsis EXAM: PORTABLE CHEST 1 VIEW COMPARISON:  01/13/2024 FINDINGS: Atherosclerotic calcification of the aortic arch. The lungs appear clear. Tubing projects over the right chest possibly a VP shunt. Old healed right rib fractures. Cardiac and mediastinal margins appear normal. IMPRESSION: 1. No acute findings. 2. Aortic Atherosclerosis (ICD10-I70.0). Electronically Signed   By: Freida Jes M.D.   On: 02/01/2024 12:00   CT Head Wo Contrast Result Date: 02/01/2024 CLINICAL DATA:  58 year old female with delirium. EXAM: CT HEAD WITHOUT CONTRAST TECHNIQUE: Contiguous axial images were obtained from the base of the skull through the vertex without intravenous contrast. RADIATION DOSE REDUCTION: This exam was performed according to the departmental dose-optimization program which includes automated exposure control, adjustment of the mA and/or kV according to patient size and/or use of iterative reconstruction technique. COMPARISON:  Head CT 01/15/2024.  Brain MRI 04/01/2021. FINDINGS: Brain: Right posterior approach ventriculostomy catheter is stable terminating in the midline, communicating with the atrium of the right lateral ventricle as before. Stable ventricle size and configuration. Stable mild encephalomalacia along the course of the catheter,  chronic right superior frontal gyrus encephalomalacia. No midline shift, mass effect, evidence of mass lesion, intracranial hemorrhage or evidence of  cortically based acute infarction. Vascular: Calcified atherosclerosis at the skull base. No suspicious intracranial vascular hyperdensity. Skull: Stable right posterior skull burr hole. Intact otherwise. No acute osseous abnormality identified. Sinuses/Orbits: Visualized paranasal sinuses and mastoids are stable and well aerated. Other: Stable right posterior approach CSF shunt reservoir and tubing. Rightward gaze. IMPRESSION: 1. No acute intracranial abnormality. 2. Stable right posterior approach CSF shunt and ventricle size. Patchy chronic right hemisphere encephalomalacia. Electronically Signed   By: Marlise Simpers M.D.   On: 02/01/2024 11:17    Procedures .Critical Care  Performed by: Albertus Hughs, DO Authorized by: Albertus Hughs, DO   Critical care provider statement:    Critical care time (minutes):  80   Critical care time was exclusive of:  Separately billable procedures and treating other patients   Critical care was time spent personally by me on the following activities:  Development of treatment plan with patient or surrogate, discussions with consultants, evaluation of patient's response to treatment, examination of patient, ordering and review of laboratory studies, ordering and review of radiographic studies, ordering and performing treatments and interventions, pulse oximetry, re-evaluation of patient's condition and review of old charts   Care discussed with: admitting provider      Procedure note: Ultrasound Guided Peripheral IV Ultrasound guided peripheral 1.88 inch angiocath IV placement performed by me. Indications: Nursing unable to place IV. Details: The antecubital fossa and upper arm were evaluated with a multifrequency linear probe. Patent brachial veins were noted. 1 attempt was made to cannulate a vein under realtime US  guidance with  successful cannulation of the vein and catheter placement. There is return of non-pulsatile dark red blood. The patient tolerated the procedure well without complications. Images archived electronically.  CPT codes: 40981 and [phone]  Procedure note: Ultrasound Guided Peripheral IV Ultrasound guided peripheral 1.88 inch angiocath IV placement performed by me. Indications: Nursing unable to place IV. Details: The antecubital fossa and upper arm were evaluated with a multifrequency linear probe. Patent brachial veins were noted. 1 attempt was made to cannulate a vein under realtime US  guidance with successful cannulation of the vein and catheter placement. There is return of non-pulsatile dark red blood. The patient tolerated the procedure well without complications. Images archived electronically.  CPT codes: 82956 and [phone]    Medications Ordered in ED Medications  lactated ringers  infusion ( Intravenous Not Given 02/01/24 1120)  insulin  regular, human (MYXREDLIN ) 100 units/ 100 mL infusion (10.5 Units/hr Intravenous Rate/Dose Verify 02/01/24 1303)  lactated ringers  infusion ( Intravenous New Bag/Given 02/01/24 1158)  dextrose  5 % in lactated ringers  infusion (0 mLs Intravenous Hold 02/01/24 1121)  dextrose  50 % solution 0-50 mL (has no administration in time range)  lactulose (CHRONULAC) enema 200 gm (has no administration in time range)  sodium bicarbonate  150 mEq in sterile water  1,150 mL infusion (has no administration in time range)  metroNIDAZOLE (FLAGYL) IVPB 500 mg (has no administration in time range)  atorvastatin  (LIPITOR) tablet 20 mg (has no administration in time range)  pantoprazole  (PROTONIX ) injection 40 mg (has no administration in time range)  divalproex  (DEPAKOTE ) DR tablet 500 mg (has no administration in time range)  lamoTRIgine  (LAMICTAL ) tablet 100 mg (has no administration in time range)  nystatin (MYCOSTATIN/NYSTOP) topical powder 1 Application (has no administration in  time range)  zinc  oxide 20 % ointment 1 Application (has no administration in time range)  heparin  injection 5,000 Units (has no administration in time range)  acetaminophen  (TYLENOL ) tablet 650 mg (has no administration  in time range)    Or  acetaminophen  (TYLENOL ) suppository 650 mg (has no administration in time range)  ondansetron  (ZOFRAN ) tablet 4 mg (has no administration in time range)    Or  ondansetron  (ZOFRAN ) injection 4 mg (has no administration in time range)  albuterol  (PROVENTIL ) (2.5 MG/3ML) 0.083% nebulizer solution 2.5 mg (has no administration in time range)  vancomycin  (VANCOCIN ) IVPB 1000 mg/200 mL premix (has no administration in time range)  ceFEPIme (MAXIPIME) 2 g in sodium chloride  0.9 % 100 mL IVPB (has no administration in time range)  Chlorhexidine  Gluconate Cloth 2 % PADS 6 each (has no administration in time range)  lactated ringers  bolus 1,000 mL (1,000 mLs Intravenous New Bag/Given 02/01/24 1014)    And  lactated ringers  bolus 1,000 mL (1,000 mLs Intravenous New Bag/Given 02/01/24 1020)    And  lactated ringers  bolus 500 mL (500 mLs Intravenous New Bag/Given 02/01/24 1120)  ceFEPIme (MAXIPIME) 2 g in sodium chloride  0.9 % 100 mL IVPB (0 g Intravenous Stopped 02/01/24 1126)  metroNIDAZOLE (FLAGYL) IVPB 500 mg (0 mg Intravenous Stopped 02/01/24 1228)  acetaminophen  (TYLENOL ) suppository 650 mg (650 mg Rectal Given 02/01/24 1053)  vancomycin  (VANCOREADY) IVPB 1750 mg/350 mL (1,750 mg Intravenous New Bag/Given 02/01/24 1031)    ED Course/ Medical Decision Making/ A&P                                 Medical Decision Making Amount and/or Complexity of Data Reviewed Labs: ordered. Radiology: ordered.  Risk OTC drugs. Prescription drug management. Decision regarding hospitalization.   58 yo F with a cc of confusion and difficulty breathing.  Noted at her nursing facility today.  Limited history from both nursing and EMS provider.  I was able to reach out to the  patient's father who is her power of attorney.  He tells me that she has had a difficult past 5 months.  Has had multiple admissions to the hospital and has had progressive confusion.  After some discussion with him on the phone he was not quite ready to make her comfort care.  Did want a trial of medical therapy to see if she had improvement.  He did agree that she would likely not benefit from intubation or CPR.  Patient is very dry on exam.  Initial blood pressures in the 80s.  Activated as code sepsis with temperature of 103.  Will place Foley.  IV access was obtained by myself x2.  Broad-spectrum antibiotics.  Reassess.  Patient is severely acidotic.  Bicarb less than 7.  Patient's lactate was almost 9.  I discussed this with the family.  Let them know that her prognosis was dismal.  They would like to try to keep her on therapy at least overnight and we will reassess her situation tomorrow.  Will discuss with hospitalist for admission.  The patients results and plan were reviewed and discussed.   Any x-rays performed were independently reviewed by myself.   Differential diagnosis were considered with the presenting HPI.  Medications  lactated ringers  infusion ( Intravenous Not Given 02/01/24 1120)  insulin  regular, human (MYXREDLIN ) 100 units/ 100 mL infusion (10.5 Units/hr Intravenous Rate/Dose Verify 02/01/24 1303)  lactated ringers  infusion ( Intravenous New Bag/Given 02/01/24 1158)  dextrose  5 % in lactated ringers  infusion (0 mLs Intravenous Hold 02/01/24 1121)  dextrose  50 % solution 0-50 mL (has no administration in time range)  lactulose (CHRONULAC) enema 200  gm (has no administration in time range)  sodium bicarbonate  150 mEq in sterile water  1,150 mL infusion (has no administration in time range)  metroNIDAZOLE (FLAGYL) IVPB 500 mg (has no administration in time range)  atorvastatin  (LIPITOR) tablet 20 mg (has no administration in time range)  pantoprazole  (PROTONIX ) injection 40  mg (has no administration in time range)  divalproex  (DEPAKOTE ) DR tablet 500 mg (has no administration in time range)  lamoTRIgine  (LAMICTAL ) tablet 100 mg (has no administration in time range)  nystatin (MYCOSTATIN/NYSTOP) topical powder 1 Application (has no administration in time range)  zinc  oxide 20 % ointment 1 Application (has no administration in time range)  heparin  injection 5,000 Units (has no administration in time range)  acetaminophen  (TYLENOL ) tablet 650 mg (has no administration in time range)    Or  acetaminophen  (TYLENOL ) suppository 650 mg (has no administration in time range)  ondansetron  (ZOFRAN ) tablet 4 mg (has no administration in time range)    Or  ondansetron  (ZOFRAN ) injection 4 mg (has no administration in time range)  albuterol  (PROVENTIL ) (2.5 MG/3ML) 0.083% nebulizer solution 2.5 mg (has no administration in time range)  vancomycin  (VANCOCIN ) IVPB 1000 mg/200 mL premix (has no administration in time range)  ceFEPIme (MAXIPIME) 2 g in sodium chloride  0.9 % 100 mL IVPB (has no administration in time range)  Chlorhexidine  Gluconate Cloth 2 % PADS 6 each (has no administration in time range)  lactated ringers  bolus 1,000 mL (1,000 mLs Intravenous New Bag/Given 02/01/24 1014)    And  lactated ringers  bolus 1,000 mL (1,000 mLs Intravenous New Bag/Given 02/01/24 1020)    And  lactated ringers  bolus 500 mL (500 mLs Intravenous New Bag/Given 02/01/24 1120)  ceFEPIme (MAXIPIME) 2 g in sodium chloride  0.9 % 100 mL IVPB (0 g Intravenous Stopped 02/01/24 1126)  metroNIDAZOLE (FLAGYL) IVPB 500 mg (0 mg Intravenous Stopped 02/01/24 1228)  acetaminophen  (TYLENOL ) suppository 650 mg (650 mg Rectal Given 02/01/24 1053)  vancomycin  (VANCOREADY) IVPB 1750 mg/350 mL (1,750 mg Intravenous New Bag/Given 02/01/24 1031)    Vitals:   02/01/24 1204 02/01/24 1210 02/01/24 1230 02/01/24 1410  BP:  92/70 95/70   Pulse:  (!) 116 (!) 117 (!) 125  Resp:  19 20 20   Temp:    (!) 93 F (33.9 C)   TempSrc:      SpO2:  100% 100% 100%  Weight: 77.1 kg       Final diagnoses:  Severe sepsis (HCC)    Admission/ observation were discussed with the admitting physician, patient and/or family and they are comfortable with the plan.          Final Clinical Impression(s) / ED Diagnoses Final diagnoses:  Severe sepsis [facility])    Rx / DC Orders ED Discharge Orders     None         Albertus Hughs, DO 02/01/24 1426

## 2024-02-01 NOTE — H&P (Signed)
 History and Physical  Carolyn Norton DOB: 1966-02-19 DOA: 02/01/2024  PCP: Dickey Fought, PA   Chief Complaint: Refusing medications, minimally responsive  HPI: Carolyn Norton is a 58 y.o. female with medical history significant for prior polysubstance abuse, normal pressure hydrocephalus, bipolar disorder, multiple admissions for DKA currently residing at [facility] being admitted to the hospital with diabetic ketoacidosis in the setting of septic shock.  History is provided by the medical record, as well as my conversation with the ER provider, as well as over the phone with her father who is also her POA.  He tells me that she is quite confused at baseline, at 1 point she was diagnosed with Alzheimer's disease, but was later found to have normal pressure hydrocephalus and he states that her mental status has improved since placement of the VP shunt.  However, she still routinely refuses medications at her facility, and this has resulted in multiple hospital admissions for diabetic ketoacidosis.  Father feels that she is confused at baseline and does not have the ability to consent to or refuse medical treatment.  As far as he knows, patient has not had any fevers, chills, nausea, vomiting or other specific complaints.  Review of Systems: Please see HPI for pertinent positives and negatives. A complete 10 system review of systems could not be performed due to the patient's condition, but was reviewed with the patient's father over the phone.  Past Medical History:  Diagnosis Date   Alzheimer disease (HCC)    from facility diagnosis, 12/10/22- orientation has improved since she had shunt placed.   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   COVID    History of   Depression    Diabetes mellitus without complication (HCC)    Type 2 (type 1 per patient and aunt)   Dyspnea    Hyperlipidemia     Hypertension    Normal pressure hydrocephalus (HCC)    Seasonal allergies    Past Surgical History:  Procedure Laterality Date   BREAST EXCISIONAL BIOPSY Right    BREAST SURGERY     right breast   CATARACT EXTRACTION W/PHACO Left 02/12/2023   Procedure: CATARACT EXTRACTION PHACO AND INTRAOCULAR LENS PLACEMENT (IOC);  Surgeon: Tarri Farm, MD;  Location: [facility];  Service: Ophthalmology;  Laterality: Left;  CDE: 22.05   CATARACT EXTRACTION W/PHACO Right 02/26/2023   Procedure: CATARACT EXTRACTION PHACO AND INTRAOCULAR LENS PLACEMENT (IOC);  Surgeon: Tarri Farm, MD;  Location: [facility];  Service: Ophthalmology;  Laterality: Right;  CDE: 24.40   TOOTH EXTRACTION N/A 12/11/2022   Procedure: DENTAL RESTORATION/EXTRACTIONS;  Surgeon: Ascencion Lava, DMD;  Location: [facility] OR;  Service: Oral Surgery;  Laterality: N/A;   VENTRICULOPERITONEAL SHUNT N/A 05/29/2022   Procedure: Ventriculoperitoneal shunt placement;  Surgeon: Gearl Keens, MD;  Location: [facility] OR;  Service: Neurosurgery;  Laterality: N/A;   Social History:  reports that she has been smoking cigarettes. She started smoking about 12 months ago. She has a 0.5 pack-year smoking history. She has never used smokeless tobacco. She reports that she does not currently use drugs after having used the following drugs: Cocaine. She reports that she does not drink alcohol.  Allergies  Allergen Reactions   Erythromycin Other (See Comments)    Childhood reaction    Family History  Problem Relation Age of Onset   Hypertension Mother  Kidney cancer Mother    Prostate cancer Father    Leukemia Brother    Breast cancer Paternal Grandmother    Endometrial cancer Neg Hx    Colon cancer Neg Hx    Ovarian cancer Neg Hx      Prior to Admission medications   Medication Sig Start Date End Date Taking? Authorizing Provider  albuterol  (VENTOLIN  HFA) 108 (90 Base) MCG/ACT inhaler Inhale 2 puffs into the lungs every 6 (six) hours as needed for wheezing or  shortness of breath.   Yes [provider]  atorvastatin  (LIPITOR) 20 MG tablet Take 1 tablet (20 mg total) by mouth daily. 04/29/21  Yes Lovell Rubenstein, NP  dapagliflozin propanediol (FARXIGA) 5 MG TABS tablet Take 5 mg by mouth in the morning and at bedtime. 11/18/23  Yes [provider]  divalproex  (DEPAKOTE ) 500 MG DR tablet Take 500 mg by mouth 3 (three) times daily.   Yes [provider]  docusate sodium  (COLACE) 100 MG capsule Take 1 capsule (100 mg total) by mouth 2 (two) times daily as needed for mild constipation. Patient taking differently: Take 100 mg by mouth 2 (two) times daily. 12/27/23  Yes Barbee Lew, MD  guaiFENesin (ROBITUSSIN) 100 MG/5ML liquid Take 5 mLs by mouth every 4 (four) hours as needed for cough or to loosen phlegm.   Yes [provider]  insulin  aspart (NOVOLOG ) 100 UNIT/ML injection Inject 6 Units into the skin 3 (three) times daily with meals. 01/18/24  Yes Macdonald Savoy, MD  insulin  glargine (LANTUS ) 100 unit/mL SOPN Inject 20 Units into the skin daily.   Yes [provider]  lamoTRIgine  (LAMICTAL ) 100 MG tablet Take 100 mg by mouth 2 (two) times daily.   Yes [provider]  Multiple Vitamins-Minerals (MULTIVITAMIN WITH MINERALS) tablet Take 1 tablet by mouth daily.   Yes [provider]  nystatin powder Apply 1 Application topically 2 (two) times daily. Apply to groin   Yes [provider]  ondansetron  (ZOFRAN ) 4 MG tablet Take 4 mg by mouth every 6 (six) hours as needed for nausea or vomiting.   Yes [provider]  pantoprazole  (PROTONIX ) 40 MG tablet Take 40 mg by mouth daily.   Yes [provider]  polyethylene glycol (MIRALAX  / GLYCOLAX ) 17 g packet Take 17 g by mouth daily. 04/30/21  Yes Lovell Rubenstein, NP  zinc  oxide 20 % ointment Apply 1 Application topically in the morning and at bedtime. Apply to groin, buttocks, and vulva   Yes [provider]   Cholecalciferol  25 MCG (1000 UT) tablet Take 1,000 Units by mouth daily. Patient not taking: Reported on 02/01/2024    [provider]    Physical Exam: BP 95/70   Pulse (!) 117   Temp (!) 103 F (39.4 C) (Oral)   Resp 20   Wt 77.1 kg   LMP 01/26/2017 (Approximate)   SpO2 100%   BMI 29.18 kg/m  General: Obtunded, resting on high flow oxygen.  No response to tactile or verbal stimulus. Cardiovascular: RRR, no murmurs or rubs, no peripheral edema  Respiratory: clear to auscultation bilaterally on anterior exam, no wheezes, no crackles  Abdomen: soft, nontender, nondistended Skin: dry, no rashes           Labs on Admission:  Basic Metabolic Panel: Recent Labs  Lab 02/01/24 0930 02/01/24 1118  NA 141 142  K 5.1 4.9  CL 97* 99  CO2 <7* <7*  GLUCOSE 438* 516*  BUN 20 22*  CREATININE 2.03* 2.12*  CALCIUM  10.0 9.3   Liver Function Tests: Recent Labs  Lab 02/01/24 0930  AST 27  ALT 14  ALKPHOS 87  BILITOT 1.7*  PROT 7.8  ALBUMIN 3.6   No results for input(s): "LIPASE", "AMYLASE" in the last 168 hours. Recent Labs  Lab 02/01/24 1118  AMMONIA 128*   CBC: Recent Labs  Lab 02/01/24 1009  WBC 14.3*  NEUTROABS 9.4*  HGB 13.4  HCT 47.6*  MCV 102.6*  PLT 406*   Cardiac Enzymes: No results for input(s): "CKTOTAL", "CKMB", "CKMBINDEX", "TROPONINI" in the last 168 hours. BNP (last 3 results) No results for input(s): "BNP" in the last 8760 hours.  ProBNP (last 3 results) No results for input(s): "PROBNP" in the last 8760 hours.  CBG: Recent Labs  Lab 02/01/24 0934 02/01/24 1155 02/01/24 1225  GLUCAP 392* 458* 505*    Radiological Exams on Admission: DG Chest Port 1 View Result Date: 02/01/2024 CLINICAL DATA:  Possible sepsis EXAM: PORTABLE CHEST 1 VIEW COMPARISON:  01/13/2024 FINDINGS: Atherosclerotic calcification of the aortic arch. The lungs appear clear. Tubing projects over the right chest possibly a VP shunt. Old healed right rib  fractures. Cardiac and mediastinal margins appear normal. IMPRESSION: 1. No acute findings. 2. Aortic Atherosclerosis (ICD10-I70.0). Electronically Signed   By: Freida Jes M.D.   On: 02/01/2024 12:00   CT Head Wo Contrast Result Date: 02/01/2024 CLINICAL DATA:  58 year old female with delirium. EXAM: CT HEAD WITHOUT CONTRAST TECHNIQUE: Contiguous axial images were obtained from the base of the skull through the vertex without intravenous contrast. RADIATION DOSE REDUCTION: This exam was performed according to the departmental dose-optimization program which includes automated exposure control, adjustment of the mA and/or kV according to patient size and/or use of iterative reconstruction technique. COMPARISON:  Head CT 01/15/2024.  Brain MRI 04/01/2021. FINDINGS: Brain: Right posterior approach ventriculostomy catheter is stable terminating in the midline, communicating with the atrium of the right lateral ventricle as before. Stable ventricle size and configuration. Stable mild encephalomalacia along the course of the catheter, chronic right superior frontal gyrus encephalomalacia. No midline shift, mass effect, evidence of mass lesion, intracranial hemorrhage or evidence of cortically based acute infarction. Vascular: Calcified atherosclerosis at the skull base. No suspicious intracranial vascular hyperdensity. Skull: Stable right posterior skull burr hole. Intact otherwise. No acute osseous abnormality identified. Sinuses/Orbits: Visualized paranasal sinuses and mastoids are stable and well aerated. Other: Stable right posterior approach CSF shunt reservoir and tubing. Rightward gaze. IMPRESSION: 1. No acute intracranial abnormality. 2. Stable right posterior approach CSF shunt and ventricle size. Patchy chronic right hemisphere encephalomalacia. Electronically Signed   By: Marlise Simpers M.D.   On: 02/01/2024 11:17   Assessment/Plan Carolyn Norton is a 58 y.o. female with medical history significant  for prior polysubstance abuse, normal pressure hydrocephalus, bipolar disorder, multiple admissions for DKA currently residing at [facility] being admitted to the hospital with diabetic ketoacidosis in the setting of septic shock.  Septic shock-unclear etiology, but is complicated by severe acidosis from DKA.  Meeting criteria with fever, leukocytosis, tachycardia, hypotension.  Initial lactic acid 8.9, improved to 8.2 after 30 cc/kg fluid bolus.  Workup is unrevealing as to infectious source, though one is suspected. -Inpatient admission to stepdown -Cardiac monitoring -Follow-up blood cultures -Obtain urinalysis and culture -Continue aggressive fluid resuscitation, with bicarb infusion for severe acidosis -Empiric IV vancomycin , IV cefepime, IV Flagyl  DKA-possibly due to acute infectious source versus medication noncompliance as patient has  a history of refusing insulin  at her facility -N.p.o. -IV fluids per DKA protocol -IV insulin  drip -Monitor BMP every 4 hours  Bipolar disorder-continue Lamictal , Depakote  if able to take p.o.  DVT prophylaxis: hepSQ     Code Status: Limited: Do not attempt resuscitation (DNR) -DNR-LIMITED -Do Not Intubate/DNI   The above diagnoses and plan of care, as well as very poor prognosis discussed over the phone with the patient's father at the time of admission.  He understands she is incredibly sick, and that she may die.  He confirms she is DNR/DNI.  He would like to continue aggressive treatment for 24 hours, and if she remains acutely ill at that time he will likely consider comfort care.  Consults called: None  Admission status: The appropriate patient status for this patient is INPATIENT. Inpatient status is judged to be reasonable and necessary in order to provide the required intensity of service to ensure the patient's safety. The patient's presenting symptoms, physical exam findings, and initial radiographic and laboratory data in the context  of their chronic comorbidities is felt to place them at high risk for further clinical deterioration. Furthermore, it is not anticipated that the patient will be medically stable for discharge from the hospital within 2 midnights of admission.    I certify that at the point of admission it is my clinical judgment that the patient will require inpatient hospital care spanning beyond 2 midnights from the point of admission due to high intensity of service, high risk for further deterioration and high frequency of surveillance required  Due to a high probability of clinically significant, life threatening deterioration, the patient required my highest level of preparedness to intervene emergently and I personally spent this critical care time directly and personally managing the patient. This critical care time included obtaining a history; examining the patient; reviewing vitals; ordering and review of studies; arranging urgent treatment with development of a management plan; evaluation of patient's response to treatment; frequent reassessment; and, discussions with other providers as well as available family.  Total critical care time: Approximately 75 minutes  Jeslyn Amsler Rickey Charm MD [facility] Pager [phone]  If 7PM-7AM, please contact night-coverage [email or web address] Password [facility]  02/01/2024, 12:50 PM

## 2024-02-01 NOTE — ED Triage Notes (Signed)
 Pt from [facility]. Staff reports PT has been on a down hill decline for several days. Pt refused insulin . Pt is Cancer pt and is immune compromised. Pt on non-re breather and only responsive to pain at this time.

## 2024-02-02 ENCOUNTER — Other Ambulatory Visit: Payer: Self-pay

## 2024-02-02 ENCOUNTER — Ambulatory Visit

## 2024-02-02 ENCOUNTER — Ambulatory Visit
Admission: RE | Admit: 2024-02-02 | Discharge: 2024-02-02 | Disposition: A | Discharge status: Home / Self Care | Source: Ambulatory Visit | Attending: Radiation Oncology | Admitting: Radiation Oncology

## 2024-02-02 DIAGNOSIS — G9341 Metabolic encephalopathy: Secondary | ICD-10-CM

## 2024-02-02 DIAGNOSIS — N179 Acute kidney failure, unspecified: Secondary | ICD-10-CM

## 2024-02-02 DIAGNOSIS — E1110 Type 2 diabetes mellitus with ketoacidosis without coma: Secondary | ICD-10-CM

## 2024-02-02 LAB — RESPIRATORY PANEL BY PCR

## 2024-02-02 LAB — GLUCOSE, CAPILLARY
Glucose-Capillary: 145 mg/dL — ABNORMAL HIGH (ref 70–99)
Glucose-Capillary: 131 mg/dL — ABNORMAL HIGH (ref 70–99)
Glucose-Capillary: 141 mg/dL — ABNORMAL HIGH (ref 70–99)
Glucose-Capillary: 116 mg/dL — ABNORMAL HIGH (ref 70–99)
Glucose-Capillary: 127 mg/dL — ABNORMAL HIGH (ref 70–99)
Glucose-Capillary: 137 mg/dL — ABNORMAL HIGH (ref 70–99)
Glucose-Capillary: 121 mg/dL — ABNORMAL HIGH (ref 70–99)
Glucose-Capillary: 116 mg/dL — ABNORMAL HIGH (ref 70–99)
Glucose-Capillary: 112 mg/dL — ABNORMAL HIGH (ref 70–99)
Glucose-Capillary: 124 mg/dL — ABNORMAL HIGH (ref 70–99)
Glucose-Capillary: 126 mg/dL — ABNORMAL HIGH (ref 70–99)
Glucose-Capillary: 144 mg/dL — ABNORMAL HIGH (ref 70–99)
Glucose-Capillary: 157 mg/dL — ABNORMAL HIGH (ref 70–99)
Glucose-Capillary: 155 mg/dL — ABNORMAL HIGH (ref 70–99)
Glucose-Capillary: 151 mg/dL — ABNORMAL HIGH (ref 70–99)
Glucose-Capillary: 190 mg/dL — ABNORMAL HIGH (ref 70–99)
Glucose-Capillary: 183 mg/dL — ABNORMAL HIGH (ref 70–99)
Glucose-Capillary: 170 mg/dL — ABNORMAL HIGH (ref 70–99)
Glucose-Capillary: 160 mg/dL — ABNORMAL HIGH (ref 70–99)
Glucose-Capillary: 156 mg/dL — ABNORMAL HIGH (ref 70–99)
Glucose-Capillary: 132 mg/dL — ABNORMAL HIGH (ref 70–99)
Glucose-Capillary: 124 mg/dL — ABNORMAL HIGH (ref 70–99)

## 2024-02-02 LAB — BLOOD CULTURE ID PANEL (REFLEXED) - BCID2

## 2024-02-02 LAB — COMPREHENSIVE METABOLIC PANEL WITH GFR
ALT: 20 U/L (ref 0–44)
AST: 66 U/L — ABNORMAL HIGH (ref 15–41)
Albumin: 2.5 g/dL — ABNORMAL LOW (ref 3.5–5.0)
Alkaline Phosphatase: 57 U/L (ref 38–126)
Anion gap: 15 (ref 5–15)
BUN: 23 mg/dL — ABNORMAL HIGH (ref 6–20)
CO2: 25 mmol/L (ref 22–32)
Calcium: 8.5 mg/dL — ABNORMAL LOW (ref 8.9–10.3)
Chloride: 102 mmol/L (ref 98–111)
Creatinine, Ser: 1.72 mg/dL — ABNORMAL HIGH (ref 0.44–1.00)
GFR, Estimated: 34 mL/min — ABNORMAL LOW (ref >60)
Glucose, Bld: 140 mg/dL — ABNORMAL HIGH (ref 70–99)
Potassium: 3.8 mmol/L (ref 3.5–5.1)
Sodium: 142 mmol/L (ref 135–145)
Total Bilirubin: 0.7 mg/dL (ref 0.0–1.2)
Total Protein: 5.5 g/dL — ABNORMAL LOW (ref 6.5–8.1)

## 2024-02-02 LAB — BASIC METABOLIC PANEL WITH GFR
Anion gap: 12 (ref 5–15)
Anion gap: 16 — ABNORMAL HIGH (ref 5–15)
Anion gap: 21 — ABNORMAL HIGH (ref 5–15)
BUN: 19 mg/dL (ref 6–20)
BUN: 22 mg/dL — ABNORMAL HIGH (ref 6–20)
BUN: 23 mg/dL — ABNORMAL HIGH (ref 6–20)
CO2: 22 mmol/L (ref 22–32)
CO2: 26 mmol/L (ref 22–32)
CO2: 27 mmol/L (ref 22–32)
Calcium: 8 mg/dL — ABNORMAL LOW (ref 8.9–10.3)
Calcium: 8.5 mg/dL — ABNORMAL LOW (ref 8.9–10.3)
Calcium: 9.1 mg/dL (ref 8.9–10.3)
Chloride: 102 mmol/L (ref 98–111)
Chloride: 102 mmol/L (ref 98–111)
Chloride: 103 mmol/L (ref 98–111)
Creatinine, Ser: 1.29 mg/dL — ABNORMAL HIGH (ref 0.44–1.00)
Creatinine, Ser: 1.66 mg/dL — ABNORMAL HIGH (ref 0.44–1.00)
Creatinine, Ser: 2.19 mg/dL — ABNORMAL HIGH (ref 0.44–1.00)
GFR, Estimated: 26 mL/min — ABNORMAL LOW (ref >60)
GFR, Estimated: 36 mL/min — ABNORMAL LOW (ref >60)
GFR, Estimated: 49 mL/min — ABNORMAL LOW (ref >60)
Glucose, Bld: 123 mg/dL — ABNORMAL HIGH (ref 70–99)
Glucose, Bld: 177 mg/dL — ABNORMAL HIGH (ref 70–99)
Glucose, Bld: 181 mg/dL — ABNORMAL HIGH (ref 70–99)
Potassium: 3.2 mmol/L — ABNORMAL LOW (ref 3.5–5.1)
Potassium: 3.4 mmol/L — ABNORMAL LOW (ref 3.5–5.1)
Potassium: 4.1 mmol/L (ref 3.5–5.1)
Sodium: 142 mmol/L (ref 135–145)
Sodium: 144 mmol/L (ref 135–145)
Sodium: 145 mmol/L (ref 135–145)

## 2024-02-02 LAB — LACTIC ACID, PLASMA
Lactic Acid, Venous: 3.4 mmol/L (ref 0.5–1.9)
Lactic Acid, Venous: 2.2 mmol/L (ref 0.5–1.9)

## 2024-02-02 LAB — URINE CULTURE: Culture: NO GROWTH

## 2024-02-02 LAB — VALPROIC ACID LEVEL: Valproic Acid Lvl: 32 ug/mL — ABNORMAL LOW (ref 50–100)

## 2024-02-02 LAB — RAD ONC ARIA SESSION SUMMARY
Course Elapsed Days: 6
Plan Fractions Treated to Date: 3
Plan Prescribed Dose Per Fraction: 1.8 Gy
Plan Total Fractions Prescribed: 25
Plan Total Prescribed Dose: 45 Gy
Reference Point Dosage Given to Date: 5.4 Gy
Reference Point Session Dosage Given: 1.8 Gy
Session Number: 3

## 2024-02-02 LAB — CBC
HCT: 38.9 % (ref 36.0–46.0)
Hemoglobin: 12.5 g/dL (ref 12.0–15.0)
MCH: 29.8 pg (ref 26.0–34.0)
MCHC: 32.1 g/dL (ref 30.0–36.0)
MCV: 92.6 fL (ref 80.0–100.0)
Platelets: 276 10*3/uL (ref 150–400)
RBC: 4.2 MIL/uL (ref 3.87–5.11)
RDW: 17.2 % — ABNORMAL HIGH (ref 11.5–15.5)
WBC: 12 10*3/uL — ABNORMAL HIGH (ref 4.0–10.5)
nRBC: 0 % (ref 0.0–0.2)

## 2024-02-02 LAB — BETA-HYDROXYBUTYRIC ACID
Beta-Hydroxybutyric Acid: 1.12 mmol/L — ABNORMAL HIGH (ref 0.05–0.27)
Beta-Hydroxybutyric Acid: 2.15 mmol/L — ABNORMAL HIGH (ref 0.05–0.27)
Beta-Hydroxybutyric Acid: 2.84 mmol/L — ABNORMAL HIGH (ref 0.05–0.27)

## 2024-02-02 LAB — MRSA NEXT GEN BY PCR, NASAL: MRSA by PCR Next Gen: NOT DETECTED

## 2024-02-02 LAB — MAGNESIUM
Magnesium: 1.7 mg/dL (ref 1.7–2.4)
Magnesium: 1.5 mg/dL — ABNORMAL LOW (ref 1.7–2.4)

## 2024-02-02 LAB — PROCALCITONIN: Procalcitonin: 9.58 ng/mL

## 2024-02-02 MED ORDER — INSULIN ASPART 100 UNIT/ML IJ SOLN: 0.0000 [IU] | Freq: Every day | INTRAMUSCULAR | Status: DC

## 2024-02-02 MED ORDER — DIVALPROEX SODIUM 250 MG PO DR TAB
500.0000 mg | DELAYED_RELEASE_TABLET | Freq: Three times a day (TID) | ORAL | Status: DC
  Administered 2024-02-03 – 2024-02-05 (×8): 500 mg via ORAL
  Filled 2024-02-02: qty 1
  Filled 2024-02-02 (×2): qty 2
  Filled 2024-02-02 (×4): qty 1
  Filled 2024-02-02 (×2): qty 2

## 2024-02-02 MED ORDER — POTASSIUM CHLORIDE CRYS ER 20 MEQ PO TBCR
40.0000 meq | EXTENDED_RELEASE_TABLET | Freq: Once | ORAL | Status: DC
  Filled 2024-02-02: qty 2

## 2024-02-02 MED ORDER — LACTULOSE 10 GM/15ML PO SOLN
10.0000 g | Freq: Two times a day (BID) | ORAL | Status: AC
  Administered 2024-02-02 (×2): 10 g via ORAL
  Filled 2024-02-02 (×2): qty 15

## 2024-02-02 MED ORDER — POTASSIUM CHLORIDE 20 MEQ PO PACK
40.0000 meq | PACK | Freq: Once | ORAL | Status: AC
  Administered 2024-02-02: 40 meq via ORAL
  Filled 2024-02-02: qty 2

## 2024-02-02 MED ORDER — INSULIN ASPART 100 UNIT/ML IJ SOLN
0.0000 [IU] | Freq: Three times a day (TID) | INTRAMUSCULAR | Status: DC
  Administered 2024-02-03: 5 [IU] via SUBCUTANEOUS
  Administered 2024-02-03: 3 [IU] via SUBCUTANEOUS
  Administered 2024-02-04: 1 [IU] via SUBCUTANEOUS
  Administered 2024-02-04: 2 [IU] via SUBCUTANEOUS
  Administered 2024-02-05: 1 [IU] via SUBCUTANEOUS
  Administered 2024-02-05: 2 [IU] via SUBCUTANEOUS

## 2024-02-02 MED ORDER — LAMOTRIGINE 100 MG PO TABS
100.0000 mg | ORAL_TABLET | Freq: Two times a day (BID) | ORAL | Status: DC
  Administered 2024-02-03 – 2024-02-05 (×5): 100 mg via ORAL
  Filled 2024-02-02 (×5): qty 1

## 2024-02-02 MED ORDER — POTASSIUM CHLORIDE 10 MEQ/100ML IV SOLN
10.0000 meq | INTRAVENOUS | Status: AC
  Administered 2024-02-02 (×3): 10 meq via INTRAVENOUS
  Filled 2024-02-02 (×3): qty 100

## 2024-02-02 MED ORDER — INSULIN GLARGINE-YFGN 100 UNIT/ML ~~LOC~~ SOLN
12.0000 [IU] | Freq: Every day | SUBCUTANEOUS | Status: DC
  Administered 2024-02-02 – 2024-02-04 (×3): 12 [IU] via SUBCUTANEOUS
  Filled 2024-02-02 (×4): qty 0.12

## 2024-02-02 NOTE — Progress Notes (Addendum)
 PROGRESS NOTE   Carolyn Norton  ZHY:[id number]    DOB: 21-Aug-1966    DOA: 02/01/2024  PCP: Dickey Fought, PA   I have briefly reviewed patients previous medical records in [facility].   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  She reportedly had worsening difficulty breathing, blood sugar greater than 500, EMS called out on the scene, noted oxygen saturation of 88% on NRBM with nasal cannula at 6 L/min.  Per family, patient had not been doing well since January.  Admitted for recurrent DKA, acute metabolic encephalopathy complicating chronic encephalopathy, questionable septic shock.  Treated per DKA protocol with Endo tool, IV fluids etc. with improvement in DKA.   Assessment & Plan:   Recurrent DKA, and type II DM not at goal Admission labs: Bicarbonate <7, glucose 438, BUN 20, creatinine 2.03 and lactate of 8.9.  VBG showed pH <6.95, PCO220, PO268 Hemoglobin A1c 11.3 on 12/24/2023 Treated per DKA protocol with aggressive IV fluids (2.5 L IV fluid bolus) followed by high-volume maintenance IV fluids.  Frequent BMP monitoring As per most recent labs that 3:30 AM on 5/28: Bicarb and anion gap have normalized.  Beta hydroxy butyrate down to 2.15 (was >8).  CBG control has improved. Awaiting results of labs just drawn at 6:45 AM. Pending review of  lab results, could consider transition to subcutaneous insulins.  Continue maintenance IV fluids.  Addendum Discussed with patient's RN this evening.  More alert and appears safe to tolerate p.o.  Unable to get BMP.  BHB down to 1.12. Transitioned to Semglee  12 units every 24 hours, NovoLog  SSI.  Titrate meds as needed. Serum valproate level subtherapeutic.  Dehydration Secondary to poor oral intake and DKA Improving.  Continue IV fluids and encourage oral intake when able.  Acute kidney injury Creatinine 0.98 on 5/12, presented with creatinine of 2.03 which peaked to 2.45. Secondary to dehydration and DKA With IV fluids, creatinine improving, down to 1.72 at latest BMP. Avoid hypotension, nephrotoxic agents and continue to follow BMP closely  Febrile illness On admission had fever of 103 F, tachypneic ranging from 21-40/min, tachycardic up to 140/min, transient hypotension to 94/55, on NRB followed by HFNC at 8 liters per minute Since then, she has defervesced and all discharge criteria have resolved Upon extensive workup, no clear source of infection.  Chest x-ray, influenza A/B/COVID/RSV PCR negative.  However procalcitonin up at 9.58 Blood culture x 2: Negative to date.  Urine culture pending. Urine microscopy negative for nitrates, rare bacteria and show 21-50 WBCs per high-power field. Currently on cefepime, metronidazole and vancomycin .  Since MRSA PCR negative, discontinued vancomycin  but continue rest for now Septic shock ruled out.  Lactic acidosis Presented with serum lactate of 8.2, likely related to DKA, dehydration, AKI and poor tissue perfusion Lactate down to 3.4.  Continue IV fluids and trending serum lactate  Acute metabolic encephalopathy complicating chronic encephalopathy CT head 5/27:  No acute intracranial abnormality. Stable right  posterior approach CSF shunt and ventricle size. Patchy chronic right hemisphere encephalomalacia. Delirium precautions.  Minimize  opioids/sedatives. Check with family when they come by to see if her mental status is at her baseline or not.  Hyperammonemia Ammonia 128 on 5/27.  Unclear significance in the absence of history of cirrhosis. Will give a dose or 2 of lactulose today and recheck ammonia level in AM.  Also will check valproate level which can cause elevated ammonia. Holding Lamictal  and valproate for today.  Normal pressure hydrocephalus s/p VP shunt CTA head as noted above stable.  Endometrial cancer Ongoing XRT.  Anxiety/depression/bipolar disorder Given her current mental status, continue to hold home meds and restart when able.  GOC As per admitting MDs discussion with patient's father/HCPOA on day of admission, he wanted to see how patient did over 24 hours and if no improvement or decline then was considering transitioning to comfort care. Palliative care consulted to address goals of care.   Body mass index is 29.18 kg/m./Overweight   DVT prophylaxis: heparin  injection 5,000 Units Start: 02/01/24 1400     Code Status: Limited: Do not attempt resuscitation (DNR) -DNR-LIMITED -Do Not Intubate/DNI :  Family Communication: Discussed with father via phone.  He indicates that patient's mother was there to visit her earlier today, patient recognized her mother and grandmother.  Mental status is improved but not yet back to baseline. Disposition:  Status is: Inpatient Remains inpatient appropriate because: Somnolent.  Remains on IV fluids and drips.     Consultants:   Palliative care medicine  Procedures:     Subjective:  Patient somnolent, on repeated questioning only tells me her first name.  Does not say anything beyond that.  Not following instructions.  As per nursing report, remains on IV fluids (125 mL of bicarb drip +125 mL of D5 drip), taking meds p.o. and reportedly looking better today than when she came in yesterday.  Also going down for XRT.  Objective:   Vitals:   02/02/24  1500 02/02/24 1600 02/02/24 1700 02/02/24 1800  BP: (!) 112/55 106/65 (!) 116/49 (!) 122/57  Pulse: 88 93 91 99  Resp: 17 14 17 14   Temp: 99.3 F (37.4 C) 99.5 F (37.5 C) 99.5 F (37.5 C) 99.3 F (37.4 C)  TempSrc:      SpO2: 96% 97% 98% 95%  Weight:      Height:        General exam: Young female, moderately built and obese lying comfortably propped up in bed without distress. Respiratory system: Clear to auscultation. Respiratory effort normal. Cardiovascular system: S1 & S2 heard, RRR. No JVD, murmurs, rubs, gallops or clicks. No pedal edema.  Telemetry personally reviewed: Sinus rhythm.  On admission had sinus tachycardia in the 130s which has resolved. Gastrointestinal system: Abdomen is nondistended, soft and nontender. No organomegaly or masses felt. Normal bowel sounds heard. Central nervous system: Mental status as noted above. No focal neurological deficits. Extremities: Did not see any spontaneous limb movements. Skin: No rashes, lesions or ulcers Psychiatry: Judgement and insight cannot be assessed but appears chronically impaired. Mood & affect cannot be assessed.     Data Reviewed:   I have personally reviewed following labs and imaging studies   CBC: Recent Labs  Lab 02/01/24 1009 02/02/24 0614  WBC 14.3* 12.0*  NEUTROABS 9.4*  --   HGB 13.4 12.5  HCT 47.6* 38.9  MCV 102.6* 92.6  PLT 406* 276    Basic Metabolic Panel: Recent Labs  Lab  02/01/24 1708 02/01/24 1955 02/02/24 0028 02/02/24 0328 02/02/24 0644  NA 144 144 145 142 144  K 4.3 3.9 4.1 3.8 3.4*  CL 102 103 102 102 102  CO2 8* 14* 22 25 26   GLUCOSE 303* 212* 181* 140* 123*  BUN 23* 24* 23* 23* 22*  CREATININE 2.45* 2.16* 2.19* 1.72* 1.66*  CALCIUM  9.1 8.7* 9.1 8.5* 8.5*  MG  --   --   --   --  1.7    Liver Function Tests: Recent Labs  Lab 02/01/24 0930 02/02/24 0328  AST 27 66*  ALT 14 20  ALKPHOS 87 57  BILITOT 1.7* 0.7  PROT 7.8 5.5*  ALBUMIN 3.6 2.5*    CBG: Recent  Labs  Lab 02/02/24 1606 02/02/24 1659 02/02/24 1803  GLUCAP 190* 183* 170*    Microbiology Studies:   Recent Results (from the past 240 hours)  Blood Culture (routine x 2)     Status: None (Preliminary result)   Collection Time: 02/01/24  9:49 AM   Specimen: BLOOD  Result Value Ref Range Status   Specimen Description   Final    BLOOD LEFT ANTECUBITAL Performed at [facility], [address] [place], [place] [address]    Special Requests   Final    BOTTLES DRAWN AEROBIC ONLY Blood Culture results may not be optimal due to an inadequate volume of blood received in culture bottles Performed at [facility] [facility], [address] [place], [place] [address]    Culture  Setup Time   Final    GRAM POSITIVE COCCI IN CLUSTERS AEROBIC BOTTLE ONLY CRITICAL RESULT CALLED TO, READ BACK BY AND VERIFIED WITH: PHARMD DREW WOFFORD ON 02/02/24 @ 1748 BY DRT Performed at [facility], [address] [place], [place] [address]    Culture GRAM POSITIVE COCCI IN CLUSTERS  Final   Report Status PENDING  Incomplete  Blood Culture ID Panel (Reflexed)     Status: Abnormal   Collection Time: 02/01/24  9:49 AM  Result Value Ref Range Status   Enterococcus faecalis NOT DETECTED NOT DETECTED Final   Enterococcus Faecium NOT DETECTED NOT DETECTED Final   Listeria monocytogenes NOT DETECTED NOT DETECTED Final   Staphylococcus species DETECTED (A) NOT DETECTED Final    Comment: CRITICAL RESULT CALLED TO, READ BACK BY AND VERIFIED WITH: PHARMD DREW WOFFORD ON 02/02/24 @ 1748 BY DRT    Staphylococcus aureus (BCID) NOT DETECTED NOT DETECTED Final   Staphylococcus epidermidis DETECTED (A) NOT DETECTED Final    Comment: Methicillin (oxacillin) resistant coagulase negative staphylococcus. Possible blood culture contaminant (unless isolated from more than one blood culture draw or clinical case suggests pathogenicity). No antibiotic treatment is indicated for blood  culture  contaminants. CRITICAL RESULT CALLED TO, READ BACK BY AND VERIFIED WITH: PHARMD DREW WOFFORD ON 02/02/24 @ 1748 BY DRT    Staphylococcus lugdunensis NOT DETECTED NOT DETECTED Final   Streptococcus species NOT DETECTED NOT DETECTED Final   Streptococcus agalactiae NOT DETECTED NOT DETECTED Final   Streptococcus pneumoniae NOT DETECTED NOT DETECTED Final   Streptococcus pyogenes NOT DETECTED NOT DETECTED Final   A.calcoaceticus-baumannii NOT DETECTED NOT DETECTED Final   Bacteroides fragilis NOT DETECTED NOT DETECTED Final   Enterobacterales NOT DETECTED NOT DETECTED Final   Enterobacter cloacae complex NOT DETECTED NOT DETECTED Final   Escherichia coli NOT DETECTED NOT DETECTED Final   Klebsiella aerogenes NOT DETECTED NOT DETECTED Final   Klebsiella oxytoca NOT DETECTED NOT DETECTED Final  Klebsiella pneumoniae NOT DETECTED NOT DETECTED Final   Proteus species NOT DETECTED NOT DETECTED Final   Salmonella species NOT DETECTED NOT DETECTED Final   Serratia marcescens NOT DETECTED NOT DETECTED Final   Haemophilus influenzae NOT DETECTED NOT DETECTED Final   Neisseria meningitidis NOT DETECTED NOT DETECTED Final   Pseudomonas aeruginosa NOT DETECTED NOT DETECTED Final   Stenotrophomonas maltophilia NOT DETECTED NOT DETECTED Final   Candida albicans NOT DETECTED NOT DETECTED Final   Candida auris NOT DETECTED NOT DETECTED Final   Candida glabrata NOT DETECTED NOT DETECTED Final   Candida krusei NOT DETECTED NOT DETECTED Final   Candida parapsilosis NOT DETECTED NOT DETECTED Final   Candida tropicalis NOT DETECTED NOT DETECTED Final   Cryptococcus neoformans/gattii NOT DETECTED NOT DETECTED Final   Methicillin resistance mecA/C DETECTED (A) NOT DETECTED Final    Comment: CRITICAL RESULT CALLED TO, READ BACK BY AND VERIFIED WITH: PHARMD DREW WOFFORD ON 02/02/24 @ 1748 BY DRT Performed at [facility], [address] [place], [place] [address]   Resp panel by RT-PCR (RSV, Flu A&B,  Covid) Anterior Nasal Swab     Status: None   Collection Time: 02/01/24 10:10 AM   Specimen: Anterior Nasal Swab  Result Value Ref Range Status   SARS Coronavirus 2 by RT PCR NEGATIVE NEGATIVE Final    Comment: (NOTE) SARS-CoV-2 target nucleic acids are NOT DETECTED.  The SARS-CoV-2 RNA is generally detectable in upper respiratory specimens during the acute phase of infection. The lowest concentration of SARS-CoV-2 viral copies this assay can detect is 138 copies/mL. A negative result does not preclude SARS-Cov-2 infection and should not be used as the sole basis for treatment or other patient management decisions. A negative result may occur with  improper specimen collection/handling, submission of specimen other than nasopharyngeal swab, presence of viral mutation(s) within the areas targeted by this assay, and inadequate number of viral copies(<138 copies/mL). A negative result must be combined with clinical observations, patient history, and epidemiological information. The expected result is Negative.  Fact Sheet for Patients:  BloggerCourse.com  Fact Sheet for Healthcare Providers:  SeriousBroker.it  This test is no t yet approved or cleared by the [place]  FDA and  has been authorized for detection and/or diagnosis of SARS-CoV-2 by FDA under an Emergency Use Authorization (EUA). This EUA will remain  in effect (meaning this test can be used) for the duration of the COVID-19 declaration under Section 564(b)(1) of the Act, 21 U.S.C.section 360bbb-3(b)(1), unless the authorization is terminated  or revoked sooner.       Influenza A by PCR NEGATIVE NEGATIVE Final   Influenza B by PCR NEGATIVE NEGATIVE Final    Comment: (NOTE) The Xpert Xpress SARS-CoV-2/FLU/RSV plus assay is intended as an aid in the diagnosis of influenza from Nasopharyngeal swab specimens and should not be used as a sole basis for treatment. Nasal  washings and aspirates are unacceptable for Xpert Xpress SARS-CoV-2/FLU/RSV testing.  Fact Sheet for Patients: BloggerCourse.com  Fact Sheet for Healthcare Providers: SeriousBroker.it  This test is not yet approved or cleared by the [place]  FDA and has been authorized for detection and/or diagnosis of SARS-CoV-2 by FDA under an Emergency Use Authorization (EUA). This EUA will remain in effect (meaning this test can be used) for the duration of the COVID-19 declaration under Section 564(b)(1) of the Act, 21 U.S.C. section 360bbb-3(b)(1), unless the authorization is terminated or revoked.     Resp Syncytial Virus by PCR NEGATIVE NEGATIVE Final  Comment: (NOTE) Fact Sheet for Patients: BloggerCourse.com  Fact Sheet for Healthcare Providers: SeriousBroker.it  This test is not yet approved or cleared by the [place]  FDA and has been authorized for detection and/or diagnosis of SARS-CoV-2 by FDA under an Emergency Use Authorization (EUA). This EUA will remain in effect (meaning this test can be used) for the duration of the COVID-19 declaration under Section 564(b)(1) of the Act, 21 U.S.C. section 360bbb-3(b)(1), unless the authorization is terminated or revoked.  Performed at [facility], [address] [place], [place] [address]   Urine Culture     Status: None   Collection Time: 02/01/24  4:00 PM   Specimen: Urine, Random  Result Value Ref Range Status   Specimen Description   Final    URINE, RANDOM Performed at [facility], [address], [place], [place] [address]    Special Requests   Final    NONE Reflexed from [id number] Performed at [facility], [address] [place], [place] [address]    Culture   Final    NO GROWTH Performed at [facility], [address], [place], [place] [address]     Report Status 02/02/2024 FINAL  Final  Blood Culture (routine x 2)     Status: None (Preliminary result)   Collection Time: 02/01/24  7:55 PM   Specimen: BLOOD RIGHT ARM  Result Value Ref Range Status   Specimen Description   Final    BLOOD RIGHT ARM Performed at [facility], [address], [place], [place] [address]    Special Requests   Final    BOTTLES DRAWN AEROBIC AND ANAEROBIC Blood Culture results may not be optimal due to an inadequate volume of blood received in culture bottles Performed at [facility], [address] [place], [place] [address]    Culture   Final    NO GROWTH < 12 HOURS Performed at [facility], [address], [place], [place] [address]    Report Status PENDING  Incomplete  MRSA Next Gen by PCR, Nasal     Status: None   Collection Time: 02/02/24  6:20 AM   Specimen: Nasal Mucosa; Nasal Swab  Result Value Ref Range Status   MRSA by PCR Next Gen NOT DETECTED NOT DETECTED Final    Comment: (NOTE) The GeneXpert MRSA Assay (FDA approved for NASAL specimens only), is one component of a comprehensive MRSA colonization surveillance program. It is not intended to diagnose MRSA infection nor to guide or monitor treatment for MRSA infections. Test performance is not FDA approved in patients less than 40 years old. Performed at [facility], [address], [place], [place] [address]   Respiratory (~20 pathogens) panel by PCR     Status: None   Collection Time: 02/02/24  6:21 AM   Specimen: Nasopharyngeal Swab; Respiratory  Result Value Ref Range Status   Adenovirus NOT DETECTED NOT DETECTED Final   Coronavirus 229E NOT DETECTED NOT DETECTED Final    Comment: (NOTE) The Coronavirus on the Respiratory Panel, DOES NOT test for the novel  Coronavirus (2019 nCoV)    Coronavirus HKU1 NOT DETECTED NOT DETECTED Final   Coronavirus NL63 NOT DETECTED NOT DETECTED Final   Coronavirus OC43 NOT DETECTED NOT  DETECTED Final   Metapneumovirus NOT DETECTED NOT DETECTED Final   Rhinovirus / Enterovirus NOT DETECTED NOT DETECTED Final   Influenza A NOT DETECTED NOT DETECTED Final  Influenza B NOT DETECTED NOT DETECTED Final   Parainfluenza Virus 1 NOT DETECTED NOT DETECTED Final   Parainfluenza Virus 2 NOT DETECTED NOT DETECTED Final   Parainfluenza Virus 3 NOT DETECTED NOT DETECTED Final   Parainfluenza Virus 4 NOT DETECTED NOT DETECTED Final   Respiratory Syncytial Virus NOT DETECTED NOT DETECTED Final   Bordetella pertussis NOT DETECTED NOT DETECTED Final   Bordetella Parapertussis NOT DETECTED NOT DETECTED Final   Chlamydophila pneumoniae NOT DETECTED NOT DETECTED Final   Mycoplasma pneumoniae NOT DETECTED NOT DETECTED Final    Comment: Performed at [facility], [address], [place], [place] [address]    Radiology Studies:  DG Chest Port 1 View Result Date: 02/01/2024 CLINICAL DATA:  Possible sepsis EXAM: PORTABLE CHEST 1 VIEW COMPARISON:  01/13/2024 FINDINGS: Atherosclerotic calcification of the aortic arch. The lungs appear clear. Tubing projects over the right chest possibly a VP shunt. Old healed right rib fractures. Cardiac and mediastinal margins appear normal. IMPRESSION: 1. No acute findings. 2. Aortic Atherosclerosis (ICD10-I70.0). Electronically Signed   By: Freida Jes M.D.   On: 02/01/2024 12:00   CT Head Wo Contrast Result Date: 02/01/2024 CLINICAL DATA:  58 year old female with delirium. EXAM: CT HEAD WITHOUT CONTRAST TECHNIQUE: Contiguous axial images were obtained from the base of the skull through the vertex without intravenous contrast. RADIATION DOSE REDUCTION: This exam was performed according to the departmental dose-optimization program which includes automated exposure control, adjustment of the mA and/or kV according to patient size and/or use of iterative reconstruction technique. COMPARISON:  Head CT 01/15/2024.  Brain MRI 04/01/2021. FINDINGS: Brain:  Right posterior approach ventriculostomy catheter is stable terminating in the midline, communicating with the atrium of the right lateral ventricle as before. Stable ventricle size and configuration. Stable mild encephalomalacia along the course of the catheter, chronic right superior frontal gyrus encephalomalacia. No midline shift, mass effect, evidence of mass lesion, intracranial hemorrhage or evidence of cortically based acute infarction. Vascular: Calcified atherosclerosis at the skull base. No suspicious intracranial vascular hyperdensity. Skull: Stable right posterior skull burr hole. Intact otherwise. No acute osseous abnormality identified. Sinuses/Orbits: Visualized paranasal sinuses and mastoids are stable and well aerated. Other: Stable right posterior approach CSF shunt reservoir and tubing. Rightward gaze. IMPRESSION: 1. No acute intracranial abnormality. 2. Stable right posterior approach CSF shunt and ventricle size. Patchy chronic right hemisphere encephalomalacia. Electronically Signed   By: Marlise Simpers M.D.   On: 02/01/2024 11:17    Scheduled Meds:    atorvastatin   20 mg Oral Daily   Chlorhexidine  Gluconate Cloth  6 each Topical Daily   [START ON 02/03/2024] divalproex   500 mg Oral TID   heparin   5,000 Units Subcutaneous Q8H   insulin  aspart  0-5 Units Subcutaneous QHS   [START ON 02/03/2024] insulin  aspart  0-9 Units Subcutaneous TID WC   insulin  glargine-yfgn  12 Units Subcutaneous QHS   lactulose   10 g Oral BID   [START ON 02/03/2024] lamoTRIgine   100 mg Oral BID   nystatin   1 Application Topical BID   pantoprazole  (PROTONIX ) IV  40 mg Intravenous Q24H    Continuous Infusions:    ceFEPime  (MAXIPIME ) IV Stopped (02/02/24 1132)   insulin  1.1 Units/hr (02/02/24 1600)   metronidazole  Stopped (02/02/24 1236)     LOS: 1 day     Aubrey Blas, MD,  FACP, [facility], [facility], [facility]   [facility]       To contact the attending  provider between 7A-7P  or the covering provider during after hours 7P-7A, please log into the web site [email or web address] and access using universal [place] password for that web site. If you do not have the password, please call the hospital operator.  02/02/2024, 6:53 PM

## 2024-02-02 NOTE — Progress Notes (Signed)
 PHARMACY - PHYSICIAN COMMUNICATION CRITICAL VALUE ALERT - BLOOD CULTURE IDENTIFICATION (BCID)  Carolyn Norton is an 58 y.o. female who presented to [facility] on 02/01/2024 with a chief complaint of DKA and fever  Assessment:  1/3 BCx bottles growing MRSE (source unknown; possibly contamination, but did have fever on admission, with no other obvious infectious source and no existing hardware other than VP shunt)  Name of physician (or Provider) Contacted: Hongalgi  Current antibiotics: Cefepime + Flagyl  Changes to prescribed antibiotics recommended: Given pathogen associated with contamination and now clinically stable; recommend continuing current abx without vancomycin , but with low threshold to add back if patient declines. Recommendations accepted by provider  Results for orders placed or performed during the hospital encounter of 02/01/24  Blood Culture ID Panel (Reflexed) (Collected: 02/01/2024  9:49 AM)  Result Value Ref Range   Enterococcus faecalis NOT DETECTED NOT DETECTED   Enterococcus Faecium NOT DETECTED NOT DETECTED   Listeria monocytogenes NOT DETECTED NOT DETECTED   Staphylococcus species DETECTED (A) NOT DETECTED   Staphylococcus aureus (BCID) NOT DETECTED NOT DETECTED   Staphylococcus epidermidis DETECTED (A) NOT DETECTED   Staphylococcus lugdunensis NOT DETECTED NOT DETECTED   Streptococcus species NOT DETECTED NOT DETECTED   Streptococcus agalactiae NOT DETECTED NOT DETECTED   Streptococcus pneumoniae NOT DETECTED NOT DETECTED   Streptococcus pyogenes NOT DETECTED NOT DETECTED   A.calcoaceticus-baumannii NOT DETECTED NOT DETECTED   Bacteroides fragilis NOT DETECTED NOT DETECTED   Enterobacterales NOT DETECTED NOT DETECTED   Enterobacter cloacae complex NOT DETECTED NOT DETECTED   Escherichia coli NOT DETECTED NOT DETECTED   Klebsiella aerogenes NOT DETECTED NOT DETECTED   Klebsiella oxytoca NOT DETECTED NOT DETECTED   Klebsiella pneumoniae NOT DETECTED NOT  DETECTED   Proteus species NOT DETECTED NOT DETECTED   Salmonella species NOT DETECTED NOT DETECTED   Serratia marcescens NOT DETECTED NOT DETECTED   Haemophilus influenzae NOT DETECTED NOT DETECTED   Neisseria meningitidis NOT DETECTED NOT DETECTED   Pseudomonas aeruginosa NOT DETECTED NOT DETECTED   Stenotrophomonas maltophilia NOT DETECTED NOT DETECTED   Candida albicans NOT DETECTED NOT DETECTED   Candida auris NOT DETECTED NOT DETECTED   Candida glabrata NOT DETECTED NOT DETECTED   Candida krusei NOT DETECTED NOT DETECTED   Candida parapsilosis NOT DETECTED NOT DETECTED   Candida tropicalis NOT DETECTED NOT DETECTED   Cryptococcus neoformans/gattii NOT DETECTED NOT DETECTED   Methicillin resistance mecA/C DETECTED (A) NOT DETECTED    Alois Colgan A 02/02/2024  6:11 PM

## 2024-02-02 NOTE — Plan of Care (Signed)
  Problem: Nutrition: Goal: Adequate nutrition will be maintained Outcome: Not Progressing   Problem: Pain Managment: Goal: General experience of comfort will improve and/or be controlled Outcome: Not Progressing

## 2024-02-02 NOTE — TOC Initial Note (Signed)
 Transition of Care [facility]) - Initial/Assessment Note    Patient Details  Name: Carolyn Norton MRN: [id number] Date of Birth: 25-Dec-1965  [facility]) CM/SW Contact:    Jonni Nettle, LCSW Phone Number: 02/02/2024, 11:35 AM  Clinical Narrative:                 CSW spoke with pt's father/POA, Weston Hamming [phone], via phone call regarding pt's discharge plan. Per Mr. Gannett, pt has been residing at [facility]. Mr. Malone reports concerns about pt returning to [facility] due to facility not giving pt her insulin . Per Mr. Hakala, plan is for pt to discharge back to [facility]. CSW confirmed with Aundra Lee at [facility] that pt can return to LTC once medically stable. TOC will continue to follow.   Expected Discharge Plan: Long Term Nursing Home Barriers to Discharge: Continued Medical Work up   Patient Goals and CMS Choice Patient states their goals for this hospitalization and ongoing recovery are:: To return to nursing home   Expected Discharge Plan and Services In-house Referral: Clinical Social Work Discharge Planning Services: NA Post Acute Care Choice: Resumption of Svcs/PTA Provider Living arrangements for the past 2 months: Assisted Living Facility [facility])                 DME Arranged: N/A DME Agency: NA       HH Arranged: NA HH Agency: NA        Prior Living Arrangements/Services Living arrangements for the past 2 months: Assisted Living Facility [facility] L[facility]) Lives with:: Self, Facility Resident Patient language and need for interpreter reviewed:: Yes Do you feel safe going back to the place where you live?: Yes      Need for Family Participation in Patient Care: Yes (Comment) Care giver support system in place?: Yes (comment)   Criminal Activity/Legal Involvement Pertinent to Current Situation/Hospitalization: No - Comment as needed   Permission Sought/Granted Permission sought to share  information with : Guardian, Oceanographer granted to share information with : Yes, Verbal Permission Granted  Share Information with NAME: Karolyne Timmons (father) and Jenifer Miu (aunt)  Permission granted to share info w AGENCY: [facility]  Permission granted to share info w Relationship: Father/POA and Aunt/POA  Permission granted to share info w Contact Information: [phone] (father) and [phone] (aunt)  Emotional Assessment Appearance::  (UTA) Attitude/Demeanor/Rapport: Unable to Assess Affect (typically observed): Unable to Assess   Alcohol / Substance Use: Not Applicable Psych Involvement: No (comment)  Admission diagnosis:  Septic shock (HCC) [A41.9, R65.21] Patient Active Problem List   Diagnosis Date Noted   Septic shock (HCC) 02/01/2024   Acute hypoxic respiratory failure (HCC) 01/14/2024   Acute encephalopathy 01/14/2024   Endometrial cancer (HCC) 01/04/2024   Diabetic ketoacidosis (HCC) 12/23/2023   (Idiopathic) normal pressure hydrocephalus (HCC) 05/29/2022   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) 05/29/2022   Essential hypertension 02/25/2022   Elevated blood-pressure reading, without diagnosis of hypertension 07/29/2021   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 04/29/2021   Protein-calorie malnutrition, severe 03/23/2021   Controlled type 2 diabetes mellitus with hyperglycemia, with long-term current use of insulin  (HCC) 02/11/2021   NPH (normal pressure hydrocephalus) (HCC)    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    Bipolar 1 disorder, depressed, full remission (HCC) 01/18/2021   Generalized weakness 01/11/2021   Acute metabolic encephalopathy 10/22/2020   Hypotension 10/22/2020   Type 2 diabetes mellitus  with hyperlipidemia (HCC) 10/22/2020   Altered mental status    Hyperosmolar hyperglycemic state (HHS) (HCC)    Hyperglycemia    Hyperkalemia    Acute kidney injury (HCC)     Hypertension    DKA (diabetic ketoacidosis) (HCC) 09/06/2020   COVID-19 09/06/2020   Bipolar 1 disorder (HCC) 09/06/2020   Breast mass status post excision 09/06/2020   Smoker 09/06/2020   Metabolic acidosis due to ingestion of drugs or chemicals 09/06/2020   PCP:  Dickey Fought, PA Pharmacy:   [facility] [phone] - [place], [place] - [address] [facility] [address] [place] [address] Phone: [phone] Fax: [phone]   Social Drivers of Health (SDOH) Social History: SDOH Screenings   Food Insecurity: Patient Unable To Answer (02/01/2024)  Housing: Patient Unable To Answer (01/15/2024)  Transportation Needs: Patient Unable To Answer (01/15/2024)  Utilities: Patient Unable To Answer (01/15/2024)  Depression (PHQ2-9): Low Risk  (07/24/2020)  Tobacco Use: High Risk (01/13/2024)   SDOH Interventions: None indicated    Readmission Risk Interventions    12/27/2023   10:26 AM  Readmission Risk Prevention Plan  Transportation Screening Complete  PCP or Specialist Appt within 5-7 Days Complete  Home Care Screening Complete  Medication Review (RN CM) Complete    [place], MSW, LCSW 02/02/2024 11:47 AM

## 2024-02-03 ENCOUNTER — Other Ambulatory Visit: Payer: Self-pay

## 2024-02-03 ENCOUNTER — Ambulatory Visit
Admission: RE | Admit: 2024-02-03 | Discharge: 2024-02-03 | Discharge status: Home / Self Care | Source: Ambulatory Visit | Attending: Radiation Oncology | Admitting: Radiation Oncology

## 2024-02-03 DIAGNOSIS — G9341 Metabolic encephalopathy: Secondary | ICD-10-CM | POA: Diagnosis not present

## 2024-02-03 DIAGNOSIS — B369 Superficial mycosis, unspecified: Secondary | ICD-10-CM

## 2024-02-03 DIAGNOSIS — E876 Hypokalemia: Secondary | ICD-10-CM | POA: Diagnosis not present

## 2024-02-03 LAB — GLUCOSE, CAPILLARY
Glucose-Capillary: 108 mg/dL — ABNORMAL HIGH (ref 70–99)
Glucose-Capillary: 85 mg/dL (ref 70–99)
Glucose-Capillary: 255 mg/dL — ABNORMAL HIGH (ref 70–99)
Glucose-Capillary: 227 mg/dL — ABNORMAL HIGH (ref 70–99)
Glucose-Capillary: 151 mg/dL — ABNORMAL HIGH (ref 70–99)

## 2024-02-03 LAB — RAD ONC ARIA SESSION SUMMARY
Course Elapsed Days: 7
Plan Fractions Treated to Date: 4
Plan Prescribed Dose Per Fraction: 1.8 Gy
Plan Total Fractions Prescribed: 25
Plan Total Prescribed Dose: 45 Gy
Reference Point Dosage Given to Date: 7.2 Gy
Reference Point Session Dosage Given: 1.8 Gy
Session Number: 4

## 2024-02-03 LAB — CBC
HCT: 33.4 % — ABNORMAL LOW (ref 36.0–46.0)
Hemoglobin: 10.9 g/dL — ABNORMAL LOW (ref 12.0–15.0)
MCH: 29.2 pg (ref 26.0–34.0)
MCHC: 32.6 g/dL (ref 30.0–36.0)
MCV: 89.5 fL (ref 80.0–100.0)
Platelets: 263 10*3/uL (ref 150–400)
RBC: 3.73 MIL/uL — ABNORMAL LOW (ref 3.87–5.11)
RDW: 17.2 % — ABNORMAL HIGH (ref 11.5–15.5)
WBC: 9.1 10*3/uL (ref 4.0–10.5)
nRBC: 0 % (ref 0.0–0.2)

## 2024-02-03 LAB — BASIC METABOLIC PANEL WITH GFR
Anion gap: 10 (ref 5–15)
BUN: 17 mg/dL (ref 6–20)
CO2: 27 mmol/L (ref 22–32)
Calcium: 7.9 mg/dL — ABNORMAL LOW (ref 8.9–10.3)
Chloride: 102 mmol/L (ref 98–111)
Creatinine, Ser: 1.31 mg/dL — ABNORMAL HIGH (ref 0.44–1.00)
GFR, Estimated: 48 mL/min — ABNORMAL LOW (ref >60)
Glucose, Bld: 100 mg/dL — ABNORMAL HIGH (ref 70–99)
Potassium: 3.3 mmol/L — ABNORMAL LOW (ref 3.5–5.1)
Sodium: 139 mmol/L (ref 135–145)

## 2024-02-03 LAB — CULTURE, BLOOD (ROUTINE X 2)

## 2024-02-03 LAB — AMMONIA: Ammonia: 21 umol/L (ref 9–35)

## 2024-02-03 MED ORDER — LACTATED RINGERS IV SOLN: INTRAVENOUS | Status: DC

## 2024-02-03 MED ORDER — MEDIHONEY WOUND/BURN DRESSING EX PSTE
1.0000 | PASTE | Freq: Every day | CUTANEOUS | Status: DC
  Administered 2024-02-03 – 2024-02-05 (×3): 1 via TOPICAL
  Filled 2024-02-03: qty 44

## 2024-02-03 MED ORDER — SODIUM CHLORIDE 0.9 % IV SOLN
3.0000 g | Freq: Four times a day (QID) | INTRAVENOUS | Status: DC
  Administered 2024-02-03 – 2024-02-05 (×8): 3 g via INTRAVENOUS
  Filled 2024-02-03 (×10): qty 8

## 2024-02-03 MED ORDER — FLUCONAZOLE 150 MG PO TABS: 150.0000 mg | ORAL_TABLET | ORAL | Status: DC

## 2024-02-03 MED ORDER — FLUCONAZOLE IN SODIUM CHLORIDE 200-0.9 MG/100ML-% IV SOLN
200.0000 mg | Freq: Once | INTRAVENOUS | Status: AC
  Administered 2024-02-03: 200 mg via INTRAVENOUS
  Filled 2024-02-03: qty 100

## 2024-02-03 MED ORDER — POTASSIUM CHLORIDE 20 MEQ PO PACK
40.0000 meq | PACK | Freq: Once | ORAL | Status: DC
  Filled 2024-02-03: qty 2

## 2024-02-03 MED ORDER — POTASSIUM CHLORIDE 10 MEQ/100ML IV SOLN
10.0000 meq | INTRAVENOUS | Status: AC
  Administered 2024-02-03 (×6): 10 meq via INTRAVENOUS
  Filled 2024-02-03 (×2): qty 100
  Filled 2024-02-03: qty 300
  Filled 2024-02-03: qty 100

## 2024-02-03 MED ORDER — POTASSIUM CHLORIDE CRYS ER 20 MEQ PO TBCR: 40.0000 meq | EXTENDED_RELEASE_TABLET | Freq: Once | ORAL | Status: DC

## 2024-02-03 MED ORDER — MAGNESIUM SULFATE 2 GM/50ML IV SOLN
2.0000 g | Freq: Once | INTRAVENOUS | Status: AC
  Administered 2024-02-03: 2 g via INTRAVENOUS
  Filled 2024-02-03: qty 50

## 2024-02-03 MED ORDER — GERHARDT'S BUTT CREAM
TOPICAL_CREAM | Freq: Two times a day (BID) | CUTANEOUS | Status: DC
  Administered 2024-02-03: 1 via TOPICAL
  Filled 2024-02-03: qty 60

## 2024-02-03 NOTE — Plan of Care (Signed)

## 2024-02-03 NOTE — Inpatient Diabetes Management (Incomplete)
 Inpatient Diabetes Program Recommendations  AACE/ADA: New Consensus Statement on Inpatient Glycemic Control (2015)  Target Ranges:  Prepandial:   less than 140 mg/dL      Peak postprandial:   less than 180 mg/dL (1-2 hours)      Critically ill patients:  140 - 180 mg/dL   Lab Results  Component Value Date   GLUCAP 227 (H) 02/03/2024   HGBA1C 11.3 (H) 12/24/2023    Review of Glycemic Control  Diabetes history: DM2 Outpatient Diabetes medications: Farxiga 5 BID, Lantus  20 daily, Novolog  6 TID Current orders for Inpatient glycemic control: Lantus  12 units daily, Novolog  0-9 units TID  HgbA1C - 11.3% Swallow study today Starting Dysphagia 3 diet   Inpatient Diabetes Program Recommendations:   Consider decreasing Semglee  to 10 units at bedtime  Consider adding Novolog  3 units TID with meals if eating > 50%  Continue to follow.  Thank you. Joni Net, RD, LDN, CDCES Inpatient Diabetes Coordinator [phone]

## 2024-02-03 NOTE — Evaluation (Addendum)
 Clinical/Bedside Swallow Evaluation Patient Details  Name: Carolyn Norton MRN: [id number] Date of Birth: [birth year]  Today's Date: 02/03/2024 Time: SLP Start Time (ACUTE ONLY): 0905 SLP Stop Time (ACUTE ONLY): 0936 SLP Time Calculation (min) (ACUTE ONLY): 31 min  Past Medical History:  Past Medical History:  Diagnosis Date   Alzheimer disease (HCC)    from facility diagnosis, 12/10/22- orientation has improved since she had shunt placed.   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   COVID    History of   Depression    Diabetes mellitus without complication (HCC)    Type 2 (type 1 per patient and aunt)   Dyspnea    Hyperlipidemia    Hypertension    Normal pressure hydrocephalus (HCC)    Seasonal allergies    Past Surgical History:  Past Surgical History:  Procedure Laterality Date   BREAST EXCISIONAL BIOPSY Right    BREAST SURGERY     right breast   CATARACT EXTRACTION W/PHACO Left 02/12/2023   Procedure: CATARACT EXTRACTION PHACO AND INTRAOCULAR LENS PLACEMENT (IOC);  Surgeon: Tarri Farm, MD;  Location: [facility];  Service: Ophthalmology;  Laterality: Left;  CDE: 22.05   CATARACT EXTRACTION W/PHACO Right 02/26/2023   Procedure: CATARACT EXTRACTION PHACO AND INTRAOCULAR LENS PLACEMENT (IOC);  Surgeon: Tarri Farm, MD;  Location: [facility];  Service: Ophthalmology;  Laterality: Right;  CDE: 24.40   TOOTH EXTRACTION N/A 12/11/2022   Procedure: DENTAL RESTORATION/EXTRACTIONS;  Surgeon: Ascencion Lava, DMD;  Location: [facility] OR;  Service: Oral Surgery;  Laterality: N/A;   VENTRICULOPERITONEAL SHUNT N/A 05/29/2022   Procedure: Ventriculoperitoneal shunt placement;  Surgeon: Gearl Keens, MD;  Location: [facility] OR;  Service: Neurosurgery;  Laterality: N/A;   HPI:  58 y.o. female with hx DM type 2, uncontrolled with medication non adherence and resultant episodes of DKA, endometrial cancer undergoing radiation, NPH with  shunt, h/o substance use, bipolar d/o, dysphagia.  CT head chronic right frontal encephalomalacia, chronic SV ischemic dx, right gaze preference, not drinking from straw 5/29 am, MBS 03/2021 PAS 2/3 w/liquid, diff with pill on test, 2 L NCwho is brought in from nursing home due to altered mental status.  Swallow eval ordered as pt not drinking from straw this am.  Of note, pt also with recent admission for DKA.    Assessment / Plan / Recommendation  Clinical Impression  Pt alert this morning and no focal CN deficits apparent and voice was clear - albeit decreased phonatory strength.  Pt with viscous dried secretions on tongue that were successfully cleared with moisture, suctioning and expectoration.  Suspect oral candidiasis - RN reports pt receiving medication for this.  Pt required cueing to initiate suction on straw *tip of straw placed into mouth with liquid released by SLP to initiate motor planning*.  She then readily used straw for intake.  3 Ounce Winnifred Havers was not passed due to pt coughing within 12 seconds of swallowing water . However no futher coughing episodes noted.  More than 4 minutes to masticate a small bolus of moistened cracker, eventually needing icecream and liquids to orally transit and clear- suspect cognitive based - due to primary oral dysphagia.  Multiple subswallows noted across all boluses - ? due to retention - Oral? pharyngeal? esophageal?      Recommend order softer foods for pt - using purees to aid oral transiting of solids. Consider medications  with ice cream - or SWEET applesauce - and recommend to start all intake with liquids.    Will follow up x1 to assure pt tolerance and effectivness of compensation strategies. Thanks for this consult.  SLP Visit Diagnosis: Dysphagia, oral phase (R13.11)    Aspiration Risk  Mild aspiration risk    Diet Recommendation Dysphagia 3 (Mech soft);Thin liquid    Liquid Administration via: Cup;Straw Medication Administration: Whole meds  with puree Supervision: Full supervision/cueing for compensatory strategies (pt could not bring arm to her mouth) Compensations: Slow rate;Small sips/bites;Minimize environmental distractions;Other (Comment) (start intake with liquids, use puree or liquid to transit masticated solids)    Other  Recommendations Oral Care Recommendations: Other (Comment);Oral care BID (check for oral retention) Caregiver Recommendations: Have oral suction available    Recommendations for follow up therapy are one component of a multi-disciplinary discharge planning process, led by the attending physician.  Recommendations may be updated based on patient status, additional functional criteria and insurance authorization.  Follow up Recommendations Follow physician's recommendations for discharge plan and follow up therapies      Assistance Recommended at Discharge    Functional Status Assessment Patient has had a recent decline in their functional status and demonstrates the ability to make significant improvements in function in a reasonable and predictable amount of time.  Frequency and Duration min 1 x/week  1 week       Prognosis Prognosis for improved oropharyngeal function: Good Barriers to Reach Goals: Time post onset      Swallow Study   General Date of Onset: 02/03/24 HPI: 58 y.o. female with hx DM type 2, uncontrolled with medication non adherence and resultant episodes of DKA, endometrial cancer undergoing radiation, NPH with shunt, h/o substance use, bipolar d/o, dysphagia.  CT head chronic right frontal encephalomalacia, chronic SV ischemic dx, right gaze preference, not drinking from straw 5/29 am, MBS 03/2021 PAS 2/3 w/liquid, diff with pill on test, 2 L NCwho is brought in from nursing home due to altered mental status.  Swallow eval ordered as pt not drinking from straw this am.  Of note, pt also with recent admission for DKA. Type of Study: Bedside Swallow Evaluation Previous Swallow  Assessment: BSE, MBS 2022 Diet Prior to this Study: Regular;Thin liquids (Level 0) Temperature Spikes Noted: No Respiratory Status: Nasal cannula History of Recent Intubation: No Behavior/Cognition: Alert;Requires cueing;Other (Comment) (inconsistently follows directions) Oral Care Completed by SLP: Yes (brushed tongue and provided water  to moisten and loosen dried secretions on anterior tongue) Oral Cavity - Dentition: Edentulous Vision: Impaired for self-feeding Self-Feeding Abilities: Total assist Patient Positioning: Upright in bed Baseline Vocal Quality: Normal Volitional Cough: Weak Volitional Swallow: Able to elicit    Oral/Motor/Sensory Function Overall Oral Motor/Sensory Function: Generalized oral weakness   Ice Chips Ice chips: Not tested   Thin Liquid Thin Liquid: Impaired Presentation: Straw Oral Phase Impairments: Other (comment) (clinically likely with oral discoordination, prolonged transiting) Oral Phase Functional Implications: Other (comment) Pharyngeal  Phase Impairments: Suspected delayed Swallow;Multiple swallows;Cough - Delayed    Nectar Thick Nectar Thick Liquid: Not tested   Honey Thick Honey Thick Liquid: Not tested   Puree Puree: Impaired Presentation: Spoon Oral Phase Impairments: Other (comment) Oral Phase Functional Implications: Other (comment) (lingual thrusting) Pharyngeal Phase Impairments: Suspected delayed Swallow Other Comments: clinically presenting with delayed swallow - ? oral transiting and/or pharyngeal reflex - suspect oral due to AMS   Solid     Solid: Impaired Presentation: Spoon Oral Phase Impairments: Reduced lingual movement/coordination;Impaired  mastication Oral Phase Functional Implications: Prolonged oral transit;Oral residue (posterior faucial pillar (left) slight retention, cleared with liquid swallows) Pharyngeal Phase Impairments: Multiple swallows Other Comments: more than 4 minutes to masticate a small bolus of moistened  cracker, eventually needing icecream and liquids to orally transit and clear      Chantal Comment 02/03/2024,10:02 AM  Maudie Sorrow, MS [facility] SLP Acute Rehab Services Office [phone]

## 2024-02-03 NOTE — Progress Notes (Signed)
 PROGRESS NOTE   Carolyn Norton  ZOX:[id number]    DOB: 03/14/1966    DOA: 02/01/2024  PCP: Dickey Fought, PA   I have briefly reviewed patients previous medical records in [facility].   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  She reportedly had worsening difficulty breathing, blood sugar greater than 500, EMS called out on the scene, noted oxygen saturation of 88% on NRBM with nasal cannula at 6 L/min.  Per family, patient had not been doing well since January.  Admitted for recurrent DKA, acute metabolic encephalopathy complicating chronic encephalopathy, questionable septic shock.  Treated per DKA protocol with Endo tool, IV fluids etc. with improvement in DKA.  Transitioned to subcutaneous insulins 5/28   Assessment & Plan:   Recurrent DKA, and type II DM not at goal Admission labs: Bicarbonate <7, glucose 438, BUN 20, creatinine 2.03 and lactate of 8.9.  VBG showed pH <6.95, PCO220, PO268 Hemoglobin A1c 11.3 on 12/24/2023 Treated per DKA protocol with aggressive IV fluids (2.5 L IV fluid bolus) followed by high-volume maintenance IV fluids.  Frequent BMP monitoring DKA resolved and transitioned to Semglee  12 units every 24 hours and NovoLog  SSI on 5/28 evening.  CBGs well-controlled.  Continue to closely monitor and adjust insulins as  needed. PTA patient on Semglee  20 units daily, NovoLog  6 units 3 times daily with meals  Dehydration Secondary to poor oral intake and DKA Improved.  As per RN report 5/29, patient unable/unwilling to take p.o.  SLP consult ordered.  Continue IV fluids until p.o. intake improves.  Acute kidney injury Creatinine 0.98 on 5/12, presented with creatinine of 2.03 which peaked to 2.45. Secondary to dehydration and DKA With IV fluids, creatinine improved but has plateaued in the 1.29-1.31 range in the last 12 hours. Continue IV fluids and follow BMP in AM.  Avoid nephrotoxics or hypotension.  Febrile illness On admission had fever of 103 F, tachypneic ranging from 21-40/min, tachycardic up to 140/min, transient hypotension to 94/55, on NRB followed by HFNC at 8 liters per minute Since then, she has defervesced and SIRS criteria have resolved Upon extensive workup, no clear source of infection.  Chest x-ray, influenza A/B/COVID/RSV PCR negative.  However procalcitonin up at 9.58 Urine culture negative 5/28: Pharmacy communicated stating 1/3 blood culture bottles growing MRSE-source unknown, possibly contamination and given that this pathogen is usually associated with contamination, patient had improved, recommended continue current antibiotics without adding back vancomycin  with low threshold to add back vancomycin  if patient declines. Currently on cefepime, metronidazole.  Since MRSA PCR negative, discontinued vancomycin  but continue rest for now Septic shock ruled out.  Lactic acidosis Presented with serum lactate of 8.2, likely related to DKA, dehydration, AKI and poor tissue perfusion Lactate down to 2.2 last night.  Continue IV fluids.  Acute metabolic encephalopathy complicating chronic encephalopathy CT head 5/27:  No acute intracranial abnormality. Stable right posterior approach CSF shunt and ventricle size. Patchy chronic right hemisphere  encephalomalacia. Delirium precautions.   Minimize opioids/sedatives. Mental status as definitely improved since admission.  Alert and much more communicative this morning.  Will await family's input to determine if she is back to baseline or not yet.  Hyperammonemia Ammonia 128 on 5/27.  Unclear significance in the absence of history of cirrhosis. S/p 2 doses of lactulose on 5/28.  Ammonia level down to 21.  Serum valproate level subtherapeutic at 32 Resume home dose of valproate and Lamictal  and monitor clinically.  Normal pressure hydrocephalus s/p VP shunt CTA head as noted above stable.  Endometrial cancer Ongoing XRT.  Anxiety/depression/bipolar disorder Resume her home meds and monitor closely.  GOC As per admitting MDs discussion with patient's father/HCPOA on day of admission, he wanted to see how patient did over 24 hours and if no improvement or decline then was considering transitioning to comfort care. Palliative care consulted to address goals of care.  Genital/perineal rash/fungal/Candida) infection Given poor p.o. intake, given severity of infection, treat with IV Diflucan. Although wish to remove Foley catheter to avoid UTI, given extent of skin breakdown and rash, continue Foley catheter for additional 24 to 48 hours Air overlay mattress WOC team consulted. Change position every 2 hours.  Hypokalemia/hypomagnesemia Replace IV and follow labs in a.m.  Anemia May be dilutional.  Follow CBC in AM.  Body mass index is 29.18 kg/m./Overweight   DVT prophylaxis: heparin  injection 5,000 Units Start: 02/01/24 1400     Code Status: Limited: Do not attempt resuscitation (DNR) -DNR-LIMITED -Do Not Intubate/DNI :  Family Communication: None at bedside this morning. Disposition:  Status is: Inpatient Remains inpatient appropriate because: Not taking or tolerating p.o., awaiting SLP, IV fluids  Stable for transfer to medical bed.     Consultants:   Palliative care medicine  Procedures:      Subjective:  As per night RN, patient refusing to take or not tolerating oral potassium supplements, changed to IV.  Interviewed and examined patient along with her female night nurse.  Alert, oriented to self, states that "I feel good, how about you?".  When asked if she was thirsty and wanted to eat or drink something, she responded stating that she wanted to eat something.  However when RN attempted to give her some fluids and a cup with straw, she held straw in the mouth but not sucking on it.  Objective:   Vitals:   02/03/24 0100 02/03/24 0200 02/03/24 0300 02/03/24 0400  BP: (!) 118/56 108/63 110/71   Pulse: 97 91 91   Resp: 17 17 (!) 27   Temp: 99.5 F (37.5 C) 99.3 F (37.4 C) 99.3 F (37.4 C)   TempSrc:    Bladder  SpO2: 93% 92% 93%   Weight:      Height:        General exam: Young female, moderately built and obese lying comfortably propped up in bed without distress.  Looks much improved compared to yesterday.  Oral mucosa still dry with tongue coated.  No thrush. Respiratory system: Clear to auscultation. Respiratory effort normal. Cardiovascular system: S1 & S2 heard, RRR. No JVD, murmurs, rubs, gallops or clicks. No pedal edema.  Telemetry personally reviewed: Sinus rhythm. Gastrointestinal system: Abdomen is nondistended, soft and nontender. No organomegaly or masses felt. Normal bowel sounds heard. Central nervous system: Mental status as noted above. No focal neurological deficits. Extremities: Did not see any spontaneous limb movements. Skin: Extensive rash in the perineal region, vulval/peri-vaginal area, red, satellite lesions, skin breakdown.  Details  as in picture below from admission GU: Has Foley catheter. Psychiatry: Judgement and insight cannot be assessed but appears chronically impaired. Mood & affect cannot be assessed.     Data Reviewed:   I have personally reviewed following labs and imaging studies   CBC: Recent Labs  Lab 02/01/24 1009  02/02/24 0614 02/03/24 0314  WBC 14.3* 12.0* 9.1  NEUTROABS 9.4*  --   --   HGB 13.4 12.5 10.9*  HCT 47.6* 38.9 33.4*  MCV 102.6* 92.6 89.5  PLT 406* 276 263    Basic Metabolic Panel: Recent Labs  Lab 02/02/24 0028 02/02/24 0328 02/02/24 0644 02/02/24 1944 02/03/24 0314  NA 145 142 144 142 139  K 4.1 3.8 3.4* 3.2* 3.3*  CL 102 102 102 103 102  CO2 22 25 26 27 27   GLUCOSE 181* 140* 123* 177* 100*  BUN 23* 23* 22* 19 17  CREATININE 2.19* 1.72* 1.66* 1.29* 1.31*  CALCIUM  9.1 8.5* 8.5* 8.0* 7.9*  MG  --   --  1.7 1.5*  --     Liver Function Tests: Recent Labs  Lab 02/01/24 0930 02/02/24 0328  AST 27 66*  ALT 14 20  ALKPHOS 87 57  BILITOT 1.7* 0.7  PROT 7.8 5.5*  ALBUMIN 3.6 2.5*    CBG: Recent Labs  Lab 02/02/24 2208 02/02/24 2254 02/03/24 0238  GLUCAP 132* 124* 108*    Microbiology Studies:   Recent Results (from the past 240 hours)  Blood Culture (routine x 2)     Status: None (Preliminary result)   Collection Time: 02/01/24  9:49 AM   Specimen: BLOOD  Result Value Ref Range Status   Specimen Description   Final    BLOOD LEFT ANTECUBITAL Performed at [facility], [address], [place], [place] [address]    Special Requests   Final    BOTTLES DRAWN AEROBIC ONLY Blood Culture results may not be optimal due to an inadequate volume of blood received in culture bottles Performed at [facility], [address] [place], [place] [address]    Culture  Setup Time   Final    GRAM POSITIVE COCCI IN CLUSTERS AEROBIC BOTTLE ONLY CRITICAL RESULT CALLED TO, READ BACK BY AND VERIFIED WITH: PHARMD DREW WOFFORD ON 02/02/24 @ 1748 BY DRT Performed at [facility], [address] [place], [place] [address]    Culture GRAM POSITIVE COCCI IN CLUSTERS  Final   Report Status PENDING  Incomplete  Blood Culture ID Panel (Reflexed)     Status: Abnormal   Collection Time: 02/01/24  9:49 AM  Result Value Ref Range Status    Enterococcus faecalis NOT DETECTED NOT DETECTED Final   Enterococcus Faecium NOT DETECTED NOT DETECTED Final   Listeria monocytogenes NOT DETECTED NOT DETECTED Final   Staphylococcus species DETECTED (A) NOT DETECTED Final    Comment: CRITICAL RESULT CALLED TO, READ BACK BY AND VERIFIED WITH: PHARMD DREW WOFFORD ON 02/02/24 @ 1748 BY DRT    Staphylococcus aureus (BCID) NOT DETECTED NOT DETECTED Final   Staphylococcus epidermidis DETECTED (A) NOT DETECTED Final    Comment: Methicillin (oxacillin) resistant coagulase negative staphylococcus. Possible blood culture contaminant (unless isolated from more than one blood culture draw or clinical case suggests pathogenicity). No antibiotic treatment is indicated for blood  culture contaminants. CRITICAL RESULT CALLED TO, READ BACK BY AND VERIFIED WITH: PHARMD DREW WOFFORD ON 02/02/24 @ 1748 BY DRT    Staphylococcus lugdunensis NOT DETECTED NOT DETECTED Final  Streptococcus species NOT DETECTED NOT DETECTED Final   Streptococcus agalactiae NOT DETECTED NOT DETECTED Final   Streptococcus pneumoniae NOT DETECTED NOT DETECTED Final   Streptococcus pyogenes NOT DETECTED NOT DETECTED Final   A.calcoaceticus-baumannii NOT DETECTED NOT DETECTED Final   Bacteroides fragilis NOT DETECTED NOT DETECTED Final   Enterobacterales NOT DETECTED NOT DETECTED Final   Enterobacter cloacae complex NOT DETECTED NOT DETECTED Final   Escherichia coli NOT DETECTED NOT DETECTED Final   Klebsiella aerogenes NOT DETECTED NOT DETECTED Final   Klebsiella oxytoca NOT DETECTED NOT DETECTED Final   Klebsiella pneumoniae NOT DETECTED NOT DETECTED Final   Proteus species NOT DETECTED NOT DETECTED Final   Salmonella species NOT DETECTED NOT DETECTED Final   Serratia marcescens NOT DETECTED NOT DETECTED Final   Haemophilus influenzae NOT DETECTED NOT DETECTED Final   Neisseria meningitidis NOT DETECTED NOT DETECTED Final   Pseudomonas aeruginosa NOT DETECTED NOT DETECTED Final    Stenotrophomonas maltophilia NOT DETECTED NOT DETECTED Final   Candida albicans NOT DETECTED NOT DETECTED Final   Candida auris NOT DETECTED NOT DETECTED Final   Candida glabrata NOT DETECTED NOT DETECTED Final   Candida krusei NOT DETECTED NOT DETECTED Final   Candida parapsilosis NOT DETECTED NOT DETECTED Final   Candida tropicalis NOT DETECTED NOT DETECTED Final   Cryptococcus neoformans/gattii NOT DETECTED NOT DETECTED Final   Methicillin resistance mecA/C DETECTED (A) NOT DETECTED Final    Comment: CRITICAL RESULT CALLED TO, READ BACK BY AND VERIFIED WITH: PHARMD DREW WOFFORD ON 02/02/24 @ 1748 BY DRT Performed at [facility], [address] [place], [place] [address]   Resp panel by RT-PCR (RSV, Flu A&B, Covid) Anterior Nasal Swab     Status: None   Collection Time: 02/01/24 10:10 AM   Specimen: Anterior Nasal Swab  Result Value Ref Range Status   SARS Coronavirus 2 by RT PCR NEGATIVE NEGATIVE Final    Comment: (NOTE) SARS-CoV-2 target nucleic acids are NOT DETECTED.  The SARS-CoV-2 RNA is generally detectable in upper respiratory specimens during the acute phase of infection. The lowest concentration of SARS-CoV-2 viral copies this assay can detect is 138 copies/mL. A negative result does not preclude SARS-Cov-2 infection and should not be used as the sole basis for treatment or other patient management decisions. A negative result may occur with  improper specimen collection/handling, submission of specimen other than nasopharyngeal swab, presence of viral mutation(s) within the areas targeted by this assay, and inadequate number of viral copies(<138 copies/mL). A negative result must be combined with clinical observations, patient history, and epidemiological information. The expected result is Negative.  Fact Sheet for Patients:  BloggerCourse.com  Fact Sheet for Healthcare Providers:  SeriousBroker.it  This  test is no t yet approved or cleared by the [place]  FDA and  has been authorized for detection and/or diagnosis of SARS-CoV-2 by FDA under an Emergency Use Authorization (EUA). This EUA will remain  in effect (meaning this test can be used) for the duration of the COVID-19 declaration under Section 564(b)(1) of the Act, 21 U.S.C.section 360bbb-3(b)(1), unless the authorization is terminated  or revoked sooner.       Influenza A by PCR NEGATIVE NEGATIVE Final   Influenza B by PCR NEGATIVE NEGATIVE Final    Comment: (NOTE) The Xpert Xpress SARS-CoV-2/FLU/RSV plus assay is intended as an aid in the diagnosis of influenza from Nasopharyngeal swab specimens and should not be used as a sole basis for treatment. Nasal washings and aspirates are unacceptable  for Xpert Xpress SARS-CoV-2/FLU/RSV testing.  Fact Sheet for Patients: BloggerCourse.com  Fact Sheet for Healthcare Providers: SeriousBroker.it  This test is not yet approved or cleared by the [place]  FDA and has been authorized for detection and/or diagnosis of SARS-CoV-2 by FDA under an Emergency Use Authorization (EUA). This EUA will remain in effect (meaning this test can be used) for the duration of the COVID-19 declaration under Section 564(b)(1) of the Act, 21 U.S.C. section 360bbb-3(b)(1), unless the authorization is terminated or revoked.     Resp Syncytial Virus by PCR NEGATIVE NEGATIVE Final    Comment: (NOTE) Fact Sheet for Patients: BloggerCourse.com  Fact Sheet for Healthcare Providers: SeriousBroker.it  This test is not yet approved or cleared by the [place]  FDA and has been authorized for detection and/or diagnosis of SARS-CoV-2 by FDA under an Emergency Use Authorization (EUA). This EUA will remain in effect (meaning this test can be used) for the duration of the COVID-19 declaration under  Section 564(b)(1) of the Act, 21 U.S.C. section 360bbb-3(b)(1), unless the authorization is terminated or revoked.  Performed at [facility], [address] [place], [place] [address]   Urine Culture     Status: None   Collection Time: 02/01/24  4:00 PM   Specimen: Urine, Random  Result Value Ref Range Status   Specimen Description   Final    URINE, RANDOM Performed at [facility], [address], [place], [place] [address]    Special Requests   Final    NONE Reflexed from [id number] Performed at [facility], [address] [place], [place] [address]    Culture   Final    NO GROWTH Performed at [facility], [address], [place], [place] [address]    Report Status 02/02/2024 FINAL  Final  Blood Culture (routine x 2)     Status: None (Preliminary result)   Collection Time: 02/01/24  7:55 PM   Specimen: BLOOD RIGHT ARM  Result Value Ref Range Status   Specimen Description   Final    BLOOD RIGHT ARM Performed at [facility], [address], [place], [place] [address]    Special Requests   Final    BOTTLES DRAWN AEROBIC AND ANAEROBIC Blood Culture results may not be optimal due to an inadequate volume of blood received in culture bottles Performed at [facility], [address], [place], [place] [address]    Culture   Final    NO GROWTH 2 DAYS Performed at [facility], [address], [place], [place] [address]    Report Status PENDING  Incomplete  MRSA Next Gen by PCR, Nasal     Status: None   Collection Time: 02/02/24  6:20 AM   Specimen: Nasal Mucosa; Nasal Swab  Result Value Ref Range Status   MRSA by PCR Next Gen NOT DETECTED NOT DETECTED Final    Comment: (NOTE) The GeneXpert MRSA Assay (FDA approved for NASAL specimens only), is one component of a comprehensive MRSA colonization surveillance program. It is not intended to diagnose MRSA infection nor to guide or  monitor treatment for MRSA infections. Test performance is not FDA approved in patients less than 78 years old. Performed at [facility], [address], [place], [place] [address]   Respiratory (~20 pathogens) panel by PCR     Status: None   Collection Time: 02/02/24  6:21 AM   Specimen: Nasopharyngeal Swab; Respiratory  Result Value Ref Range Status   Adenovirus NOT DETECTED NOT DETECTED Final   Coronavirus 229E NOT DETECTED NOT DETECTED Final    Comment: (NOTE) The Coronavirus on the Respiratory Panel, DOES NOT test for the novel  Coronavirus (2019 nCoV)    Coronavirus HKU1 NOT DETECTED NOT DETECTED Final   Coronavirus NL63 NOT DETECTED NOT DETECTED Final   Coronavirus OC43 NOT DETECTED NOT DETECTED Final   Metapneumovirus NOT DETECTED NOT DETECTED Final   Rhinovirus / Enterovirus NOT DETECTED NOT DETECTED Final   Influenza A NOT DETECTED NOT DETECTED Final   Influenza B NOT DETECTED NOT DETECTED Final   Parainfluenza Virus 1 NOT DETECTED NOT DETECTED Final   Parainfluenza Virus 2 NOT DETECTED NOT DETECTED Final   Parainfluenza Virus 3 NOT DETECTED NOT DETECTED Final   Parainfluenza Virus 4 NOT DETECTED NOT DETECTED Final   Respiratory Syncytial Virus NOT DETECTED NOT DETECTED Final   Bordetella pertussis NOT DETECTED NOT DETECTED Final   Bordetella Parapertussis NOT DETECTED NOT DETECTED Final   Chlamydophila pneumoniae NOT DETECTED NOT DETECTED Final   Mycoplasma pneumoniae NOT DETECTED NOT DETECTED Final    Comment: Performed at [facility], [address], [place], [place] [address]    Radiology Studies:  DG Chest Port 1 View Result Date: 02/01/2024 CLINICAL DATA:  Possible sepsis EXAM: PORTABLE CHEST 1 VIEW COMPARISON:  01/13/2024 FINDINGS: Atherosclerotic calcification of the aortic arch. The lungs appear clear. Tubing projects over the right chest possibly a VP shunt. Old healed right rib fractures. Cardiac and mediastinal margins appear  normal. IMPRESSION: 1. No acute findings. 2. Aortic Atherosclerosis (ICD10-I70.0). Electronically Signed   By: Freida Jes M.D.   On: 02/01/2024 12:00   CT Head Wo Contrast Result Date: 02/01/2024 CLINICAL DATA:  58 year old female with delirium. EXAM: CT HEAD WITHOUT CONTRAST TECHNIQUE: Contiguous axial images were obtained from the base of the skull through the vertex without intravenous contrast. RADIATION DOSE REDUCTION: This exam was performed according to the departmental dose-optimization program which includes automated exposure control, adjustment of the mA and/or kV according to patient size and/or use of iterative reconstruction technique. COMPARISON:  Head CT 01/15/2024.  Brain MRI 04/01/2021. FINDINGS: Brain: Right posterior approach ventriculostomy catheter is stable terminating in the midline, communicating with the atrium of the right lateral ventricle as before. Stable ventricle size and configuration. Stable mild encephalomalacia along the course of the catheter, chronic right superior frontal gyrus encephalomalacia. No midline shift, mass effect, evidence of mass lesion, intracranial hemorrhage or evidence of cortically based acute infarction. Vascular: Calcified atherosclerosis at the skull base. No suspicious intracranial vascular hyperdensity. Skull: Stable right posterior skull burr hole. Intact otherwise. No acute osseous abnormality identified. Sinuses/Orbits: Visualized paranasal sinuses and mastoids are stable and well aerated. Other: Stable right posterior approach CSF shunt reservoir and tubing. Rightward gaze. IMPRESSION: 1. No acute intracranial abnormality. 2. Stable right posterior approach CSF shunt and ventricle size. Patchy chronic right hemisphere encephalomalacia. Electronically Signed   By: Marlise Simpers M.D.   On: 02/01/2024 11:17    Scheduled Meds:    atorvastatin   20 mg Oral Daily   Chlorhexidine  Gluconate Cloth  6 each Topical Daily   divalproex   500 mg Oral TID    heparin   5,000 Units Subcutaneous Q8H   insulin  aspart  0-5 Units Subcutaneous QHS   insulin  aspart  0-9 Units Subcutaneous TID WC   insulin  glargine-yfgn  12 Units Subcutaneous QHS   lamoTRIgine   100 mg Oral BID  nystatin   1 Application Topical BID   pantoprazole  (PROTONIX ) IV  40 mg Intravenous Q24H    Continuous Infusions:    ceFEPime  (MAXIPIME ) IV Stopped (02/02/24 2323)   insulin  Stopped (02/02/24 2247)   lactated ringers      metronidazole  100 mL/hr at 02/02/24 2355   potassium chloride        LOS: 2 days     Aubrey Blas, MD,  FACP, [facility], [facility], [facility]   [facility] [place]      To contact the attending provider between 7A-7P or the covering provider during after hours 7P-7A, please log into the web site [email or web address] and access using universal [place] password for that web site. If you do not have the password, please call the hospital operator.  02/03/2024, 8:08 AM

## 2024-02-03 NOTE — Progress Notes (Signed)
 This RN attempted to give PO potassium packets in water  with a straw and pt was unable to drink through a straw as she previously did around 2330. Pt's neurological status appears to be the same otherwise. She is tracking with her eyes, giving "yes" and "no" responses, and appears to attempt to follow commands. Bailey Lesser NP made aware.

## 2024-02-03 NOTE — Consult Note (Addendum)
 WOC Nurse Consult Note: Reason for Consult: Consult requested for left leg wounds, bilat heel wounds and buttocks/sacrum wounds.  Performed remotely after review of progress notes and photos in the EMR.  Pt is critically ill with multiple systemic factors which can impair healing. They are on a low airlooss mattress to reduce pressure and increase airflow to the affected atreas on sacrum/bilat buttocks.  Left outer ankle with 3 areas of full thickness wounds with dry eschar. Location and appearance are not consistent with pressure.  Bilat heels with darker colored intact skin under the surface level; Deep tissue pressure injuries. Bilat buttocks and sacrum with extensive areas of red, moist macerated skin and full thickness skin loss.  Appearance is consistent with moisture associated skin damage.  Pressure Injury POA: Yes Dressing procedure/placement/frequency:  Float bilat heels in Prevalon boots to reduce pressure. Topical treatment orders provided for bedside nurses to perform as follows:  1. Apply Gerhardts cream to bilat buttocks BID and PRN when turning and cleaning, then apply antifungal powder over the cream.  2. Medihoney to left ankle wounds Q day (in 3 areas) then cover with foam dressing.  Change foam dressing q 3 days or PRN soiling. Please re-consult if further assistance is needed.  Thank-you,  Wiliam Harder MSN, RN, CWOCN, [place], CNS [phone]

## 2024-02-03 NOTE — Inpatient Diabetes Management (Signed)
 Inpatient Diabetes Program Recommendations  AACE/ADA: New Consensus Statement on Inpatient Glycemic Control (2015)  Target Ranges:  Prepandial:   less than 140 mg/dL      Peak postprandial:   less than 180 mg/dL (1-2 hours)      Critically ill patients:  140 - 180 mg/dL   Lab Results  Component Value Date   GLUCAP 227 (H) 02/03/2024   HGBA1C 11.3 (H) 12/24/2023    Review of Glycemic Control  Diabetes history: DM2 Outpatient Diabetes medications: Farxiga 5 BID, Lantus  20 daily, Novolog  6 TID Current orders for Inpatient glycemic control: Lantus  12 daily, Novolog  0-9 TID  HgbA1C - 11.3% Swallow study today Starting Dysphagia 3 diet  Inpatient Diabetes Program Recommendations:    Consider decreasing Semglee  to 10 units at bedtime  Consider adding Noolog 3 units TID with meals if eating > 50%  Continue to follow.  Thank you. Joni Net, RD, LDN, CDCES Inpatient Diabetes Coordinator [phone]

## 2024-02-03 NOTE — Consult Note (Incomplete)
 Palliative Care  Consultation Note  58 yo with multiple end stage medical problems from SNF with NPH s/p shunt,possible early onset Alzheimers disease with cognitive delay, severe bipolar disease, uncontrolled diabetes and recurrent admissions for DKA due to insulin  non-adherence and refusing medications at SNF. She currently critically ill in ICU, has a previously established DNR/MOST and father is designated Product manager. Her mother and grandmother at bedside during my visit. The patient responds inconsistently with 1-2 word phrases.

## 2024-02-03 NOTE — Plan of Care (Signed)
  Problem: Clinical Measurements: Goal: Ability to maintain clinical measurements within normal limits will improve Outcome: Progressing Goal: Will remain free from infection Outcome: Progressing Goal: Diagnostic test results will improve Outcome: Progressing Goal: Respiratory complications will improve Outcome: Progressing Goal: Cardiovascular complication will be avoided Outcome: Progressing   Problem: Coping: Goal: Level of anxiety will decrease Outcome: Progressing   Problem: Elimination: Goal: Will not experience complications related to bowel motility Outcome: Progressing Goal: Will not experience complications related to urinary retention Outcome: Progressing   Problem: Education: Goal: Knowledge of General Education information will improve Description: Including pain rating scale, medication(s)/side effects and non-pharmacologic comfort measures Outcome: Not Progressing   Problem: Health Behavior/Discharge Planning: Goal: Ability to manage health-related needs will improve Outcome: Not Progressing

## 2024-02-04 ENCOUNTER — Ambulatory Visit

## 2024-02-04 ENCOUNTER — Ambulatory Visit: Admission: RE | Admit: 2024-02-04 | Source: Ambulatory Visit

## 2024-02-04 DIAGNOSIS — G9341 Metabolic encephalopathy: Secondary | ICD-10-CM | POA: Diagnosis not present

## 2024-02-04 DIAGNOSIS — N179 Acute kidney failure, unspecified: Secondary | ICD-10-CM | POA: Diagnosis not present

## 2024-02-04 DIAGNOSIS — R1312 Dysphagia, oropharyngeal phase: Secondary | ICD-10-CM | POA: Diagnosis not present

## 2024-02-04 LAB — GLUCOSE, CAPILLARY
Glucose-Capillary: 103 mg/dL — ABNORMAL HIGH (ref 70–99)
Glucose-Capillary: 123 mg/dL — ABNORMAL HIGH (ref 70–99)
Glucose-Capillary: 190 mg/dL — ABNORMAL HIGH (ref 70–99)
Glucose-Capillary: 152 mg/dL — ABNORMAL HIGH (ref 70–99)

## 2024-02-04 LAB — BASIC METABOLIC PANEL WITH GFR
Anion gap: 8 (ref 5–15)
BUN: 16 mg/dL (ref 6–20)
CO2: 27 mmol/L (ref 22–32)
Calcium: 8.1 mg/dL — ABNORMAL LOW (ref 8.9–10.3)
Chloride: 104 mmol/L (ref 98–111)
Creatinine, Ser: 1.14 mg/dL — ABNORMAL HIGH (ref 0.44–1.00)
GFR, Estimated: 56 mL/min — ABNORMAL LOW (ref >60)
Glucose, Bld: 145 mg/dL — ABNORMAL HIGH (ref 70–99)
Potassium: 4 mmol/L (ref 3.5–5.1)
Sodium: 139 mmol/L (ref 135–145)

## 2024-02-04 LAB — CBC
HCT: 37.1 % (ref 36.0–46.0)
Hemoglobin: 11.7 g/dL — ABNORMAL LOW (ref 12.0–15.0)
MCH: 29 pg (ref 26.0–34.0)
MCHC: 31.5 g/dL (ref 30.0–36.0)
MCV: 92.1 fL (ref 80.0–100.0)
Platelets: UNDETERMINED 10*3/uL (ref 150–400)
RBC: 4.03 MIL/uL (ref 3.87–5.11)
RDW: 17.8 % — ABNORMAL HIGH (ref 11.5–15.5)
WBC: 7.8 10*3/uL (ref 4.0–10.5)
nRBC: 0 % (ref 0.0–0.2)

## 2024-02-04 LAB — MAGNESIUM: Magnesium: 1.9 mg/dL (ref 1.7–2.4)

## 2024-02-04 MED ORDER — CLONAZEPAM 0.5 MG PO TBDP
1.0000 mg | ORAL_TABLET | Freq: Two times a day (BID) | ORAL | Status: DC
  Administered 2024-02-04 – 2024-02-05 (×3): 1 mg via ORAL
  Filled 2024-02-04: qty 2
  Filled 2024-02-04: qty 8
  Filled 2024-02-04 (×2): qty 2

## 2024-02-04 MED ORDER — PANTOPRAZOLE SODIUM 40 MG PO TBEC
40.0000 mg | DELAYED_RELEASE_TABLET | Freq: Every day | ORAL | Status: DC
  Administered 2024-02-04 – 2024-02-05 (×2): 40 mg via ORAL
  Filled 2024-02-04 (×2): qty 1

## 2024-02-04 NOTE — Progress Notes (Signed)
 Speech Language Pathology Treatment: Dysphagia  Patient Details Name: Carolyn Norton MRN: [id number] DOB: May 31, 1966 Today's Date: 02/04/2024 Time: 1405-1430 SLP Time Calculation (min) (ACUTE ONLY): 25 min  Assessment / Plan / Recommendation Clinical Impression  Pt seen today for dysphagia treatment for utility of compensation strategies and pt's mental status. She is fully alert today, interactive and cooperative. With direct ?, she said she could not feed herself, but she was able to eat graham crackers, approx 1/2 cup ice cream, and 8 ounces of diet ginger ale with encouragement. Prolonged mastication but adequate oral clearance noted. Pt feeding herself allows improved motor planning and neurological hiearchy prompted her to swallow just prior to placing more food in her mouth. RN reports pt had some difficulties with prolonged mastication with solids - advised set up pt for PO- try finger foods and intermittent supervision. Pt willing to feed herself to maintain diet at current level. Further advised that pureed items can be ordered *i.e. applesauce, mashed potatoes, etc* to aid oral transiting of masticated solids - or using liquids is an option also. Pt making great progress today and was able to verbalize precaution using teach back.     HPI HPI: 58 y.o. female with hx DM type 2, uncontrolled with medication non adherence and resultant episodes of DKA, endometrial cancer undergoing radiation, NPH with shunt, h/o substance use, bipolar d/o, dysphagia.  CT head chronic right frontal encephalomalacia, chronic SV ischemic dx, right gaze preference, not drinking from straw 5/29 am, MBS 03/2021 PAS 2/3 w/liquid, diff with pill on test, 2 L NCwho is brought in from nursing home due to altered mental status.  Swallow eval ordered as pt not drinking from straw this am.  Of note, pt also with recent admission for DKA.      SLP Plan  Continue with current plan of care      Recommendations for  follow up therapy are one component of a multi-disciplinary discharge planning process, led by the attending physician.  Recommendations may be updated based on patient status, additional functional criteria and insurance authorization.    Recommendations  Diet recommendations: Dysphagia 3 (mechanical soft);Thin liquid Liquids provided via: Cup;Straw Medication Administration: Whole meds with puree Supervision: Staff to assist with self feeding Compensations: Slow rate;Small sips/bites;Other (Comment) (start intake with liquids, pt to feed self) Postural Changes and/or Swallow Maneuvers: Seated upright 90 degrees;Upright 30-60 min after meal                        Dysphagia, oral phase (R13.11)     Continue with current plan of care   Maudie Sorrow, MS [facility] SLP Acute Rehab Services Office [phone]   Chantal Comment  02/04/2024, 3:07 PM

## 2024-02-04 NOTE — TOC Progression Note (Addendum)
 Transition of Care [facility]) - Progression Note    Patient Details  Name: Carolyn Norton MRN: [id number] Date of Birth: [birth year]  Transition of Care [facility]) CM/SW Contact  Levie Ream, RN Phone Number: 02/04/2024, 3:44 PM  Clinical Narrative:    [facility] consult for hospice; pt is from [facility] long term care; spoke w/ Aundra Lee at facility; he says contract hospice agencies at facility are [facility], [facility], and [place]; spoke w/ pt's father Miliana Gangwer [phone]); he was given names of contract agencies; Mr Bennett Brass declined receiving list of agencies, and says he does not have an agency preference, referral placed w/ Madelene Schanz, [facility] Liaison.  Expected Discharge Plan: Long Term Nursing Home Barriers to Discharge: Continued Medical Work up  Expected Discharge Plan and Services In-house Referral: Clinical Social Work Discharge Planning Services: NA Post Acute Care Choice: Resumption of Svcs/PTA Provider Living arrangements for the past 2 months: Assisted Living Facility [facility])                 DME Arranged: N/A DME Agency: NA       HH Arranged: NA HH Agency: NA         Social Determinants of Health (SDOH) Interventions SDOH Screenings   Food Insecurity: Patient Unable To Answer (02/01/2024)  Housing: Patient Unable To Answer (01/15/2024)  Transportation Needs: Patient Unable To Answer (01/15/2024)  Utilities: Patient Unable To Answer (01/15/2024)  Depression (PHQ2-9): Low Risk  (07/24/2020)  Tobacco Use: High Risk (01/13/2024)    Readmission Risk Interventions    12/27/2023   10:26 AM  Readmission Risk Prevention Plan  Transportation Screening Complete  PCP or Specialist Appt within 5-7 Days Complete  Home Care Screening Complete  Medication Review (RN CM) Complete

## 2024-02-04 NOTE — Progress Notes (Addendum)
 PROGRESS NOTE   Carolyn Norton  NWG:[id number]    DOB: Feb 01, 1966    DOA: 02/01/2024  PCP: Dickey Fought, PA   I have briefly reviewed patients previous medical records in [facility].   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  She reportedly had worsening difficulty breathing, blood sugar greater than 500, EMS called out on the scene, noted oxygen saturation of 88% on NRBM with nasal cannula at 6 L/min.  Per family, patient had not been doing well since January.  Admitted for recurrent DKA, acute metabolic encephalopathy complicating chronic encephalopathy, questionable septic shock.  Treated per DKA protocol with Endo tool, IV fluids etc. with improvement in DKA.  Transitioned to subcutaneous insulins 5/28   Assessment & Plan:   Recurrent DKA, and type II DM not at goal Admission labs: Bicarbonate <7, glucose 438, BUN 20, creatinine 2.03 and lactate of 8.9.  VBG showed pH <6.95, PCO220, PO268 Hemoglobin A1c 11.3 on 12/24/2023 Treated per DKA protocol with aggressive IV fluids (2.5 L IV fluid bolus) followed by high-volume maintenance IV fluids.  Frequent BMP monitoring DKA resolved and transitioned to Semglee  12 units every 24 hours and NovoLog  SSI on 5/28 evening.  CBGs mildly uncontrolled and labile.  Continue to closely monitor and adjust  insulins as needed.  Challenging to adjust insulins due to inconsistent oral intake due to fluctuating mental status.  Per nursing, did not get much of night meds due to sleepiness. PTA patient on Semglee  20 units daily, NovoLog  6 units 3 times daily with meals  Dehydration Secondary to poor oral intake and DKA Resolved.  Encourage oral intake.  Acute kidney injury Creatinine 0.98 on 5/12, presented with creatinine of 2.03 which peaked to 2.45. Secondary to dehydration and DKA Resolved with IV fluids.  At risk for recurrent AKI due to inconsistent oral intake and chronic mental status changes. Avoid nephrotoxics or hypotension.  Dysphagia SLP input appreciated.  Started on dysphagia 3 diet and thin liquids. Monitor  Febrile illness On admission had fever of 103 F, tachypneic ranging from 21-40/min, tachycardic up to 140/min, transient hypotension to 94/55, on NRB followed by HFNC at 8 liters per minute Since then, she has defervesced and SIRS criteria have resolved Upon extensive workup, no clear source of infection.  Chest x-ray, influenza A/B/COVID/RSV PCR negative.  However procalcitonin up at 9.58 Urine culture negative Septic shock ruled out. 1/3 blood cultures from admission positive for MRSE, suspected to be contaminant.  Initially on IV cefepime, vancomycin  and metronidazole.  As discussed with pharmacy, narrowed down to IV Unasyn to treat for potential aspiration.  Depending on p.o. intake, could consider transitioning to Augmentin or complete total 5 to 7 days of IV antibiotics.  Lactic acidosis Presented with serum lactate of 8.2, likely related to DKA, dehydration, AKI and poor tissue perfusion Resolved after IV fluids.  Acute metabolic encephalopathy complicating chronic encephalopathy CT head 5/27:  No acute intracranial abnormality. Stable right posterior  approach CSF shunt and ventricle size. Patchy chronic right hemisphere encephalomalacia. Delirium precautions.   Minimize opioids/sedatives. Mental status on 5/30 morning was the best that I have seen in the last 3 days caring for her.  As per family, mental status reportedly has been gradually declining given as outpatient.  This may be close to her recent baseline.  Hyperammonemia Ammonia 128 on 5/27.  Unclear significance in the absence of history of cirrhosis. S/p 2 doses of lactulose on 5/28.  Ammonia level down to 21.  Serum valproate level subtherapeutic at 32 Resumed home dose of valproate and Lamictal  and monitor clinically.  Normal pressure hydrocephalus s/p VP shunt CTA head as noted above stable.  Endometrial cancer Ongoing XRT.  Anxiety/depression/bipolar disorder Resumed her home meds and monitor closely.  GOC As per admitting MDs discussion with patient's father/HCPOA on day of admission, he wanted to see how patient did over 24 hours and if no improvement or decline then was considering transitioning to comfort care. Palliative care consulted to address goals of care.  It appears that Dr. Glady Laming may have met with family on 5/29, details noticed pending.  Genital/perineal rash/fungal/Candida) infection/moisture associated skin damage Given poor p.o. intake, given severity of infection, treat with IV Diflucan x 1 followed by weekly Diflucan x 3.  Topical nystatin. Although wish to remove Foley catheter to avoid UTI, given extent of skin breakdown and rash, continue Foley catheter for additional 24 to 48 hours Air overlay mattress WOC team consultation appreciated.  Follow recommendations Change position every 2 hours.  Pressure Injury 02/01/24 Heel Left Deep Tissue Pressure Injury - Purple or maroon localized area of discolored intact skin or blood-filled blister due to damage of underlying soft tissue from pressure and/or shear. (Active)  02/01/24 1400  Location: Heel  Location Orientation: Left  Staging: Deep Tissue Pressure Injury - Purple or maroon localized area of discolored  intact skin or blood-filled blister due to damage of underlying soft tissue from pressure and/or shear.  Wound Description (Comments):   Present on Admission: Yes     Pressure Injury 02/01/24 Heel Right Deep Tissue Pressure Injury - Purple or maroon localized area of discolored intact skin or blood-filled blister due to damage of underlying soft tissue from pressure and/or shear. (Active)  02/01/24 1400  Location: Heel  Location Orientation: Right  Staging: Deep Tissue Pressure Injury - Purple or maroon localized area of discolored intact skin or blood-filled blister due to damage of underlying soft tissue from pressure and/or shear.  Wound Description (Comments):   Present on Admission: Yes  Agree with WOC RN evaluation and management as noted above.   Hypokalemia/hypomagnesemia Replaced.  Will add magnesium  to this morning's labs and replace as needed.  Anemia May be dilutional.  Stable.  Body mass index is 29.18 kg/m./Overweight   DVT prophylaxis: heparin  injection 5,000 Units Start: 02/01/24 1400     Code Status: Limited: Do not attempt resuscitation (DNR) -DNR-LIMITED -Do Not Intubate/DNI :  Family Communication: None at bedside this morning. Disposition:  Overall improving.  Transfer from progressive to medical bed today.  Pending palliative care input, should be medically optimized for DC tomorrow back to her facility with understanding that she remains at extremely high risk for recurrent hospitalization and overall poor long-term prognosis.     Consultants:   Palliative care medicine  Procedures:     Subjective:  As per RN report, was not able to give meds last night due to somnolence.  When I went into the  room, patient is wide-awake, alert, smiling, when I asked her how she was doing, she responded "I am good".  Oriented to self but not to place.  States that she is at her aunt's place.  When I asked her if she wants to eat or drink something, she said "yes" and when  I asked her what she wants to drink, she responded "alcohol"!  No complaints reported.  Objective:   Vitals:   02/04/24 0400 02/04/24 0500 02/04/24 0600 02/04/24 0700  BP: 137/83 136/86 (!) 129/49 125/72  Pulse: 70 77 83 70  Resp: 19 17 12  (!) 21  Temp: 98.4 F (36.9 C) 98.2 F (36.8 C) 98.4 F (36.9 C) 98.4 F (36.9 C)  TempSrc:      SpO2: 99% 100% 98% 94%  Weight:      Height:        General exam: Young female, moderately built and obese lying comfortably propped up in bed without distress.  This is the best that she is looked in the last 3 days caring for her.  Oral mucosa moist. Respiratory system: Clear to auscultation. Respiratory effort normal. Cardiovascular system: S1 & S2 heard, RRR. No JVD, murmurs, rubs, gallops or clicks. No pedal edema.  Telemetry personally reviewed: Sinus rhythm. Gastrointestinal system: Abdomen is nondistended, soft and nontender. No organomegaly or masses felt. Normal bowel sounds heard. Central nervous system: Mental status as noted above. No focal neurological deficits. Extremities: Appears to be moving her limbs spontaneously. Skin: As examined with her female RN at bedside on 5/29: Extensive rash in the perineal region, vulval/peri-vaginal area, red, satellite lesions, skin breakdown.  Details as in picture below from admission GU: Has Foley catheter. Psychiatry: Judgement and insight cannot be assessed but appears chronically impaired. Mood & affect pleasant.    Data Reviewed:   I have personally reviewed following labs and imaging studies   CBC: Recent Labs  Lab 02/01/24 1009 02/02/24 0614 02/03/24 0314 02/04/24 0327  WBC 14.3* 12.0* 9.1 7.8  NEUTROABS 9.4*  --   --   --   HGB 13.4 12.5 10.9* 11.7*  HCT 47.6* 38.9 33.4* 37.1  MCV 102.6* 92.6 89.5 92.1  PLT 406* 276 263 PLATELET CLUMPS NOTED ON SMEAR, UNABLE TO ESTIMATE    Basic Metabolic Panel: Recent Labs  Lab 02/02/24 0328 02/02/24 0644 02/02/24 1944 02/03/24 0314  02/04/24 0327  NA 142 144 142 139 139  K 3.8 3.4* 3.2* 3.3* 4.0  CL 102 102 103 102 104  CO2 25 26 27 27 27   GLUCOSE 140* 123* 177* 100* 145*  BUN 23* 22* 19 17 16   CREATININE 1.72* 1.66* 1.29* 1.31* 1.14*  CALCIUM  8.5* 8.5* 8.0* 7.9* 8.1*  MG  --  1.7 1.5*  --   --     Liver Function Tests: Recent Labs  Lab 02/01/24 0930 02/02/24 0328  AST 27 66*  ALT 14 20  ALKPHOS 87 57  BILITOT 1.7* 0.7  PROT 7.8 5.5*  ALBUMIN 3.6 2.5*    CBG: Recent Labs  Lab 02/03/24 1205 02/03/24 1605 02/03/24 2141  GLUCAP 255* 227* 151*    Microbiology Studies:   Recent Results (from the past 240 hours)  Blood Culture (routine x 2)     Status: Abnormal   Collection Time: 02/01/24  9:49 AM   Specimen: BLOOD  Result Value Ref Range Status   Specimen Description   Final    BLOOD LEFT ANTECUBITAL Performed at [facility], [address]  [place], [place] [address]    Special Requests   Final    BOTTLES DRAWN AEROBIC ONLY Blood Culture results may not be optimal due to an inadequate volume of blood received in culture bottles Performed at [facility], [address], [place], [place] [address]    Culture  Setup Time   Final    GRAM POSITIVE COCCI IN CLUSTERS AEROBIC BOTTLE ONLY CRITICAL RESULT CALLED TO, READ BACK BY AND VERIFIED WITH: PHARMD DREW WOFFORD ON 02/02/24 @ 1748 BY DRT    Culture (A)  Final    STAPHYLOCOCCUS EPIDERMIDIS THE SIGNIFICANCE OF ISOLATING THIS ORGANISM FROM A SINGLE SET OF BLOOD CULTURES WHEN MULTIPLE SETS ARE DRAWN IS UNCERTAIN. PLEASE NOTIFY THE MICROBIOLOGY DEPARTMENT WITHIN ONE WEEK IF SPECIATION AND SENSITIVITIES ARE REQUIRED. Performed at [facility], [address], [place], [place] [address]    Report Status 02/03/2024 FINAL  Final  Blood Culture ID Panel (Reflexed)     Status: Abnormal   Collection Time: 02/01/24  9:49 AM  Result Value Ref Range Status   Enterococcus faecalis NOT DETECTED NOT DETECTED Final    Enterococcus Faecium NOT DETECTED NOT DETECTED Final   Listeria monocytogenes NOT DETECTED NOT DETECTED Final   Staphylococcus species DETECTED (A) NOT DETECTED Final    Comment: CRITICAL RESULT CALLED TO, READ BACK BY AND VERIFIED WITH: PHARMD DREW WOFFORD ON 02/02/24 @ 1748 BY DRT    Staphylococcus aureus (BCID) NOT DETECTED NOT DETECTED Final   Staphylococcus epidermidis DETECTED (A) NOT DETECTED Final    Comment: Methicillin (oxacillin) resistant coagulase negative staphylococcus. Possible blood culture contaminant (unless isolated from more than one blood culture draw or clinical case suggests pathogenicity). No antibiotic treatment is indicated for blood  culture contaminants. CRITICAL RESULT CALLED TO, READ BACK BY AND VERIFIED WITH: PHARMD DREW WOFFORD ON 02/02/24 @ 1748 BY DRT    Staphylococcus lugdunensis NOT DETECTED NOT DETECTED Final   Streptococcus species NOT DETECTED NOT DETECTED Final   Streptococcus agalactiae NOT DETECTED NOT DETECTED Final   Streptococcus pneumoniae NOT DETECTED NOT DETECTED Final   Streptococcus pyogenes NOT DETECTED NOT DETECTED Final   A.calcoaceticus-baumannii NOT DETECTED NOT DETECTED Final   Bacteroides fragilis NOT DETECTED NOT DETECTED Final   Enterobacterales NOT DETECTED NOT DETECTED Final   Enterobacter cloacae complex NOT DETECTED NOT DETECTED Final   Escherichia coli NOT DETECTED NOT DETECTED Final   Klebsiella aerogenes NOT DETECTED NOT DETECTED Final   Klebsiella oxytoca NOT DETECTED NOT DETECTED Final   Klebsiella pneumoniae NOT DETECTED NOT DETECTED Final   Proteus species NOT DETECTED NOT DETECTED Final   Salmonella species NOT DETECTED NOT DETECTED Final   Serratia marcescens NOT DETECTED NOT DETECTED Final   Haemophilus influenzae NOT DETECTED NOT DETECTED Final   Neisseria meningitidis NOT DETECTED NOT DETECTED Final   Pseudomonas aeruginosa NOT DETECTED NOT DETECTED Final   Stenotrophomonas maltophilia NOT DETECTED NOT  DETECTED Final   Candida albicans NOT DETECTED NOT DETECTED Final   Candida auris NOT DETECTED NOT DETECTED Final   Candida glabrata NOT DETECTED NOT DETECTED Final   Candida krusei NOT DETECTED NOT DETECTED Final   Candida parapsilosis NOT DETECTED NOT DETECTED Final   Candida tropicalis NOT DETECTED NOT DETECTED Final   Cryptococcus neoformans/gattii NOT DETECTED NOT DETECTED Final   Methicillin resistance mecA/C DETECTED (A) NOT DETECTED Final    Comment: CRITICAL RESULT CALLED TO, READ BACK BY AND VERIFIED WITH: PHARMD DREW WOFFORD ON 02/02/24 @ 1748 BY DRT Performed at [facility], [address], [place], [place] [address]   Resp panel by RT-PCR (RSV, Flu A&B, Covid) Anterior Nasal Swab     Status: None   Collection Time: 02/01/24 10:10 AM   Specimen: Anterior Nasal Swab  Result Value Ref Range Status   SARS Coronavirus 2 by RT PCR NEGATIVE NEGATIVE Final    Comment: (NOTE) SARS-CoV-2 target nucleic acids are NOT DETECTED.  The SARS-CoV-2 RNA is generally detectable in upper respiratory specimens during the acute phase of infection. The lowest concentration of SARS-CoV-2 viral copies this assay can detect is 138 copies/mL. A negative result does not preclude SARS-Cov-2 infection and should not be used as the sole basis for treatment or other patient management decisions. A negative result may occur with  improper specimen collection/handling, submission of specimen other than nasopharyngeal swab, presence of viral mutation(s) within the areas targeted by this assay, and inadequate number of viral copies(<138 copies/mL). A negative result must be combined with clinical observations, patient history, and epidemiological information. The expected result is Negative.  Fact Sheet for Patients:  BloggerCourse.com  Fact Sheet for Healthcare Providers:  SeriousBroker.it  This test is no t yet approved or cleared by the  [place]  FDA and  has been authorized for detection and/or diagnosis of SARS-CoV-2 by FDA under an Emergency Use Authorization (EUA). This EUA will remain  in effect (meaning this test can be used) for the duration of the COVID-19 declaration under Section 564(b)(1) of the Act, 21 U.S.C.section 360bbb-3(b)(1), unless the authorization is terminated  or revoked sooner.       Influenza A by PCR NEGATIVE NEGATIVE Final   Influenza B by PCR NEGATIVE NEGATIVE Final    Comment: (NOTE) The Xpert Xpress SARS-CoV-2/FLU/RSV plus assay is intended as an aid in the diagnosis of influenza from Nasopharyngeal swab specimens and should not be used as a sole basis for treatment. Nasal washings and aspirates are unacceptable for Xpert Xpress SARS-CoV-2/FLU/RSV testing.  Fact Sheet for Patients: BloggerCourse.com  Fact Sheet for Healthcare Providers: SeriousBroker.it  This test is not yet approved or cleared by the [place]  FDA and has been authorized for detection and/or diagnosis of SARS-CoV-2 by FDA under an Emergency Use Authorization (EUA). This EUA will remain in effect (meaning this test can be used) for the duration of the COVID-19 declaration under Section 564(b)(1) of the Act, 21 U.S.C. section 360bbb-3(b)(1), unless the authorization is terminated or revoked.     Resp Syncytial Virus by PCR NEGATIVE NEGATIVE Final    Comment: (NOTE) Fact Sheet for Patients: BloggerCourse.com  Fact Sheet for Healthcare Providers: SeriousBroker.it  This test is not yet approved or cleared by the [place]  FDA and has been authorized for detection and/or diagnosis of SARS-CoV-2 by FDA under an Emergency Use Authorization (EUA). This EUA will remain in effect (meaning this test can be used) for the duration of the COVID-19 declaration under Section 564(b)(1) of the Act, 21 U.S.C. section  360bbb-3(b)(1), unless the authorization is terminated or revoked.  Performed at [facility], [address], [place], [place] [address]   Urine Culture     Status: None   Collection Time: 02/01/24  4:00 PM   Specimen: Urine, Random  Result Value Ref Range Status   Specimen Description   Final    URINE, RANDOM Performed at [facility], [address] [place], [place] [address]    Special Requests   Final    NONE Reflexed from [id number] Performed  at [facility], [address], [place], [place] [address]    Culture   Final    NO GROWTH Performed at [facility], [address], [place], [place] [address]    Report Status 02/02/2024 FINAL  Final  Blood Culture (routine x 2)     Status: None (Preliminary result)   Collection Time: 02/01/24  7:55 PM   Specimen: BLOOD RIGHT ARM  Result Value Ref Range Status   Specimen Description   Final    BLOOD RIGHT ARM Performed at [facility], [address] [place], [place] [address]    Special Requests   Final    BOTTLES DRAWN AEROBIC AND ANAEROBIC Blood Culture results may not be optimal due to an inadequate volume of blood received in culture bottles Performed at [facility], [address], [place], [place] [address]    Culture   Final    NO GROWTH 2 DAYS Performed at [facility], [address] [place], [place] [address]    Report Status PENDING  Incomplete  MRSA Next Gen by PCR, Nasal     Status: None   Collection Time: 02/02/24  6:20 AM   Specimen: Nasal Mucosa; Nasal Swab  Result Value Ref Range Status   MRSA by PCR Next Gen NOT DETECTED NOT DETECTED Final    Comment: (NOTE) The GeneXpert MRSA Assay (FDA approved for NASAL specimens only), is one component of a comprehensive MRSA colonization surveillance program. It is not intended to diagnose MRSA infection nor to guide or monitor treatment for MRSA infections. Test  performance is not FDA approved in patients less than 87 years old. Performed at [facility], [address], [place], [place] [address]   Respiratory (~20 pathogens) panel by PCR     Status: None   Collection Time: 02/02/24  6:21 AM   Specimen: Nasopharyngeal Swab; Respiratory  Result Value Ref Range Status   Adenovirus NOT DETECTED NOT DETECTED Final   Coronavirus 229E NOT DETECTED NOT DETECTED Final    Comment: (NOTE) The Coronavirus on the Respiratory Panel, DOES NOT test for the novel  Coronavirus (2019 nCoV)    Coronavirus HKU1 NOT DETECTED NOT DETECTED Final   Coronavirus NL63 NOT DETECTED NOT DETECTED Final   Coronavirus OC43 NOT DETECTED NOT DETECTED Final   Metapneumovirus NOT DETECTED NOT DETECTED Final   Rhinovirus / Enterovirus NOT DETECTED NOT DETECTED Final   Influenza A NOT DETECTED NOT DETECTED Final   Influenza B NOT DETECTED NOT DETECTED Final   Parainfluenza Virus 1 NOT DETECTED NOT DETECTED Final   Parainfluenza Virus 2 NOT DETECTED NOT DETECTED Final   Parainfluenza Virus 3 NOT DETECTED NOT DETECTED Final   Parainfluenza Virus 4 NOT DETECTED NOT DETECTED Final   Respiratory Syncytial Virus NOT DETECTED NOT DETECTED Final   Bordetella pertussis NOT DETECTED NOT DETECTED Final   Bordetella Parapertussis NOT DETECTED NOT DETECTED Final   Chlamydophila pneumoniae NOT DETECTED NOT DETECTED Final   Mycoplasma pneumoniae NOT DETECTED NOT DETECTED Final    Comment: Performed at [facility], [address], [place], [place] [address]    Radiology Studies:  No results found.   Scheduled Meds:    atorvastatin   20 mg Oral Daily   Chlorhexidine  Gluconate Cloth  6 each Topical Daily   divalproex   500 mg Oral TID   [START ON 02/10/2024] fluconazole  150 mg Oral Weekly   Gerhardt's butt cream  Topical BID   heparin   5,000 Units Subcutaneous Q8H   insulin  aspart  0-5 Units Subcutaneous QHS   insulin  aspart  0-9 Units Subcutaneous TID WC    insulin  glargine-yfgn  12 Units Subcutaneous QHS   lamoTRIgine   100 mg Oral BID   leptospermum manuka honey  1 Application Topical Daily   nystatin  1 Application Topical BID   pantoprazole  (PROTONIX ) IV  40 mg Intravenous Q24H    Continuous Infusions:    ampicillin -sulbactam (UNASYN) IV Stopped (02/04/24 0555)   insulin  Stopped (02/02/24 2247)   lactated ringers  75 mL/hr at 02/04/24 0600     LOS: 3 days     Aubrey Blas, MD,  FACP, [facility], [facility], [facility]   [facility] [place]      To contact the attending provider between 7A-7P or the covering provider during after hours 7P-7A, please log into the web site [email or web address] and access using universal [place] password for that web site. If you do not have the password, please call the hospital operator.  02/04/2024, 7:57 AM

## 2024-02-04 NOTE — Progress Notes (Signed)
 PHARMACIST - PHYSICIAN COMMUNICATION  DR:   Sherrod Dolphin  CONCERNING: IV to Oral Route Change Policy  RECOMMENDATION: This patient is receiving pantoprazole  by the intravenous route.  Based on criteria approved by the Pharmacy and Therapeutics Committee, the intravenous medication(s) is/are being converted to the equivalent oral dose form(s).   DESCRIPTION: These criteria include: The patient is eating (either orally or via tube) and/or has been taking other orally administered medications for a least 24 hours The patient has no evidence of active gastrointestinal bleeding or impaired GI absorption (gastrectomy, short bowel, patient on TNA or NPO).  If you have questions about this conversion, please contact the Pharmacy Department  []   [phone] )  [facility]ine Done []   [phone] )  [facility] []   [phone] )  Arlin Benes []   [phone] )  [facility] [x]   [phone] )  [facility]   [place], [facility] 02/04/2024 12:39 PM

## 2024-02-04 NOTE — Progress Notes (Signed)
 PT Cancellation Note  Patient Details Name: [name] MRN: [id number] DOB: 07-05-1966   Cancelled Treatment:     PT order received and eval attempted but deferred this am at request of RN - pt just transferred up from ICU and scheduled for radiation at 11:45.  Will follow.   Romonia Yanik 02/04/2024, 11:27 AM

## 2024-02-04 NOTE — Inpatient Diabetes Management (Signed)
 Inpatient Diabetes Program Recommendations  AACE/ADA: New Consensus Statement on Inpatient Glycemic Control (2015)  Target Ranges:  Prepandial:   less than 140 mg/dL      Peak postprandial:   less than 180 mg/dL (1-2 hours)      Critically ill patients:  140 - 180 mg/dL    Latest Reference Range & Units 09/06/23 11:54 12/24/23 09:04  Hemoglobin A1C 4.8 - 5.6 % 12.3 (H) 11.3 (H)  278 mg/dl  (H): Data is abnormally high  Latest Reference Range & Units 02/03/24 08:45 02/03/24 12:05 02/03/24 16:05 02/03/24 21:41  Glucose-Capillary 70 - 99 mg/dL 85 595 (H)  5 units Novolog   227 (H)  3 units Novolog   151 (H)   12 units Semglee   (H): Data is abnormally high   Latest Reference Range & Units 02/04/24 08:47  Glucose-Capillary 70 - 99 mg/dL 638 (H)  (H): Data is abnormally high   Admit with: DKA/ Septic Shock from [facility] Per Father who is HCPOA, pt still routinely refuses medications at her facility, and this has resulted in multiple hospital admissions for diabetic ketoacidosis   History: DM, Polysubstance abuse, normal pressure hydrocephalus, bipolar disorder, multiple admissions for DKA   SNF DM Meds: Farxiga 5 mg BID     Novolog  6 units TID with meals     Lantus  20 units daily  Current Orders: Novolog  Sensitive Correction Scale/ SSI (0-9 units) TID AC + HS     Semglee  12 units at HS     MD- Note pt admitted with DKA.    Recommend d/c Farxiga from SNF meds when pt discharges given risk for DKA with SGLT-2 inhibitor meds.  Note PO intake inconsistent due to mental status   May consider increasing the Novolog  SSI to the 0-15 unit Moderate scale     --Will follow patient during hospitalization--  Langston Pippins RN, MSN, CDCES Diabetes Coordinator Inpatient Glycemic Control Team Team Pager: [phone] (8a-5p)

## 2024-02-04 NOTE — Progress Notes (Signed)
 Palliative Care Progress Note  Spoke at length with Carolyn Norton's father HCPOA. He shared with me a long history of mental and physical health problems that his daughter has suffered through. He reports that her previous admission was the most severe with prolonged perioid of time down in DKA and subsequent brain damage. He does not believe she has a good QOL and would choose comfort care if she were able in this situation.  I offered a comfort care path with hospice services to follow her at discharge. He is agreeable to this-I am hopeful that with hospice care she will not have to return to the hospital and if and when she has another acute event that she can be cared for in hospice IPU for EOL. The family hope for dignity at the end of her life and have struggled to get the right care for [facility] for years.  Recommendations:  DNR Comfort Care Hospice Referral at LTC No rehospitalization Treat anxiety and mood disorder aggressively. Stop XRT and focus on comfort.  Carolyn Humphreys, DO Palliative Medicine

## 2024-02-05 DIAGNOSIS — E1110 Type 2 diabetes mellitus with ketoacidosis without coma: Secondary | ICD-10-CM | POA: Diagnosis not present

## 2024-02-05 LAB — BASIC METABOLIC PANEL WITH GFR
Anion gap: 10 (ref 5–15)
BUN: 10 mg/dL (ref 6–20)
CO2: 27 mmol/L (ref 22–32)
Calcium: 8 mg/dL — ABNORMAL LOW (ref 8.9–10.3)
Chloride: 104 mmol/L (ref 98–111)
Creatinine, Ser: 0.93 mg/dL (ref 0.44–1.00)
GFR, Estimated: >60 mL/min (ref >60)
Glucose, Bld: 119 mg/dL — ABNORMAL HIGH (ref 70–99)
Potassium: 3.9 mmol/L (ref 3.5–5.1)
Sodium: 141 mmol/L (ref 135–145)

## 2024-02-05 LAB — GLUCOSE, CAPILLARY
Glucose-Capillary: 71 mg/dL (ref 70–99)
Glucose-Capillary: 122 mg/dL — ABNORMAL HIGH (ref 70–99)
Glucose-Capillary: 190 mg/dL — ABNORMAL HIGH (ref 70–99)

## 2024-02-05 MED ORDER — AMOXICILLIN-POT CLAVULANATE 875-125 MG PO TABS: 1.0000 | ORAL_TABLET | Freq: Two times a day (BID) | ORAL | Status: DC

## 2024-02-05 MED ORDER — CLONAZEPAM 1 MG PO TBDP
1.0000 mg | ORAL_TABLET | Freq: Two times a day (BID) | ORAL | 0 refills | Status: DC
Start: 2024-02-05 — End: 2024-02-09

## 2024-02-05 MED ORDER — FLUCONAZOLE 150 MG PO TABS: 150.0000 mg | ORAL_TABLET | ORAL | Status: DC

## 2024-02-05 MED ORDER — INSULIN GLARGINE 100 UNITS/ML SOLOSTAR PEN
10.0000 [IU] | PEN_INJECTOR | Freq: Every day | SUBCUTANEOUS | Status: DC
Start: 2024-02-05 — End: 2024-02-09

## 2024-02-05 MED ORDER — INSULIN ASPART 100 UNIT/ML IJ SOLN: 0.0000 [IU] | Freq: Three times a day (TID) | INTRAMUSCULAR | Status: AC

## 2024-02-05 MED ORDER — GERHARDT'S BUTT CREAM: 1.0000 | TOPICAL_CREAM | Freq: Two times a day (BID) | CUTANEOUS | Status: DC

## 2024-02-05 MED ORDER — MEDIHONEY WOUND/BURN DRESSING EX PSTE
1.0000 | PASTE | Freq: Every day | CUTANEOUS | Status: DC
Start: 2024-02-06 — End: 2024-02-09

## 2024-02-05 NOTE — Progress Notes (Signed)
 [facility] Liaison Note  Received request from [facility] for hospice services at [facility] LTC after discharge. Spoke with patient's father to initiate education related to hospice philosophy, services and team approach to care. Father verbalized understanding of information given. Per discussion, the plan is for discharge today.   DME needs discussed. Patient has the following equipment in the home: n/a. Family requests the following equipment for delivery: none at this time, hospice admissions nurse to assess needs.   Please send signed and completed DNR home with patient/family. Please provide prescriptions at discharge as needed to ensure ongoing symptom management.  [facility] information and contact numbers given to patient's father. Please call with any concerns.  Thank you for the opportunity to participate in this patient's care.   Ardine Beckwith, LPN [facility] Liaison [phone]

## 2024-02-05 NOTE — Plan of Care (Signed)

## 2024-02-05 NOTE — TOC Transition Note (Signed)
 Transition of Care [facility]) - Discharge Note   Patient Details  Name: Carolyn Norton MRN: [id number] Date of Birth: [birth year]  [facility]) CM/SW Contact:  Amaryllis Junior, LCSW Phone Number: 02/05/2024, 3:23 PM   Clinical Narrative:    Pt medically ready to dc back to [facility]. DC packet with signed DNR and script sleft at nurses station. PTAR called at 3:25pm. No further TOC needs.    Final next level of care: Long Term Acute Care (LTAC) Barriers to Discharge: Barriers Resolved   Patient Goals and CMS Choice Patient states their goals for this hospitalization and ongoing recovery are:: return to LTC   Choice offered to / list presented to : NA      Discharge Placement                Patient to be transferred to facility by: PTAR   Patient and family notified of of transfer: 02/05/24  Discharge Plan and Services Additional resources added to the After Visit Summary for   In-house Referral: Clinical Social Work Discharge Planning Services: NA Post Acute Care Choice: Resumption of Svcs/PTA Provider          DME Arranged: N/A DME Agency: NA       HH Arranged: NA HH Agency: NA        Social Drivers of Health (SDOH) Interventions SDOH Screenings   Food Insecurity: Patient Unable To Answer (02/01/2024)  Housing: Patient Unable To Answer (01/15/2024)  Transportation Needs: Patient Unable To Answer (01/15/2024)  Utilities: Patient Unable To Answer (01/15/2024)  Depression (PHQ2-9): Low Risk  (07/24/2020)  Tobacco Use: High Risk (01/13/2024)     Readmission Risk Interventions    02/05/2024    3:06 PM 12/27/2023   10:26 AM  Readmission Risk Prevention Plan  Transportation Screening Complete Complete  PCP or Specialist Appt within 5-7 Days  Complete  Home Care Screening  Complete  Medication Review (RN CM)  Complete  Medication Review (RN Care Manager) Complete   PCP or Specialist appointment within 3-5 days of discharge Complete   HRI or  Home Care Consult Complete   SW Recovery Care/Counseling Consult Complete   Palliative Care Screening Complete   Skilled Nursing Facility Complete

## 2024-02-05 NOTE — Progress Notes (Signed)
 PT Cancellation Note/ Screen  Patient Details Name: Carolyn Norton MRN: [id number] DOB: 07-08-1966   Cancelled Treatment:    Reason Eval/Treat Not Completed: PT screened, no needs identified, will sign off Pt from LTC SNF and total care PTA.   Myna Asal Payson 02/05/2024, 9:03 AM Blanch Bunde, DPT Physical Therapist Acute Rehabilitation Services Office: [phone]

## 2024-02-05 NOTE — Discharge Instructions (Signed)

## 2024-02-05 NOTE — Discharge Summary (Signed)
 Physician Discharge Summary  Carolyn Norton YQM:[id number] DOB: 1966/04/04  PCP: Dickey Fought, PA  Admitted from: [facility] LTC Discharged to: [facility] LTC with [employer] hospice services  Admit date: 02/01/2024 Discharge date: 02/05/2024  Recommendations for Outpatient Follow-up:    Follow-up Information     MD at [facility] LTC Follow up.   Why: Follow-up in 2 to 3 days.  [facility] will follow at nursing facility for hospice care.        Dickey Fought, PA. Schedule an appointment as soon as possible for a visit.   Specialty: Physician Assistant Contact information: [address] [place] [address] [place] [address] [phone]                  Home Health: None    Equipment/Devices: TBD at LTC    Discharge Condition: Improved and stable.  Overall poor prognosis.   Code Status: Limited: Do not attempt resuscitation (DNR) -DNR-LIMITED -Do Not Intubate/DNI  Diet recommendation:  Discharge Diet Orders (From admission, onward)     Start     Ordered   02/05/24 0000  Diet - low sodium heart healthy        02/05/24 1458   02/05/24 0000  Diet Carb Modified       Comments: Diet recommendations: Dysphagia 3 (mechanical soft);Thin liquid Liquids provided via: Cup;Straw Medication Administration: Whole meds with puree Supervision: Staff to assist with self feeding Compensations: Slow rate;Small sips/bites;Other (Comment) (start intake with liquids, pt to feed self) Postural Changes and/or Swallow Maneuvers: Seated upright 90 degrees;Upright 30-60 min after meal   02/05/24 1458             Discharge Diagnoses:  Principal Problem:   Septic shock [facility])   Brief Hospital Course:  58 year old female, resident of ALF, father is her HCPOA, medical history significant for 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  She reportedly had worsening difficulty breathing, blood sugar greater than 500, EMS called out on the scene, noted oxygen saturation of 88% on NRBM with nasal cannula at 6 L/min.  Per family, patient had not been doing well since January.  Admitted for recurrent DKA, acute metabolic encephalopathy complicating chronic encephalopathy, questionable septic shock.  Treated per DKA protocol with Endo tool, IV fluids etc. with improvement in DKA.  Transitioned to subcutaneous insulins 5/28.  Palliative care medicine was consulted for goals of care.  They discussed extensively with patient's father/HCPOA and decision made to pursue return to LTC with hospice, no rehospitalization (see details below)     Assessment & Plan:    Recurrent DKA, and type II DM not at goal Admission labs: Bicarbonate <7, glucose 438, BUN 20, creatinine 2.03 and lactate of 8.9.  VBG showed pH <6.95, PCO220, PO268 Hemoglobin A1c 11.3 on 12/24/2023 Treated per DKA protocol with aggressive IV fluids (2.5 L IV fluid bolus) followed by high-volume maintenance IV fluids.  Frequent BMP monitoring DKA resolved and transitioned to Semglee  12 units every 24 hours and NovoLog  SSI on 5/28 evening.   PTA  patient on Semglee  20 units daily, NovoLog  6 units 3 times daily with meals Fasting blood sugar 71.  Therefore reduced Lantus  at DC to 10 units at bedtime, sensitive NovoLog  SSI without bedtime scale.  Adjust insulins as needed at SNF.     Dehydration Secondary to poor oral intake and DKA Resolved.  Encourage oral  intake.  Remains at high risk for recurrent dehydration and related complications due to inconsistent oral intake.   Acute kidney injury Creatinine 0.98 on 5/12, presented with creatinine of 2.03 which peaked to 2.45. Secondary to dehydration and DKA Resolved with IV fluids.  At risk for recurrent AKI due to inconsistent oral intake and chronic mental status changes. Avoid nephrotoxics or hypotension.   Dysphagia SLP input appreciated.  Started on dysphagia 3 diet and thin liquids. Monitor   Febrile illness On admission had fever of 103 F, tachypneic ranging from 21-40/min, tachycardic up to 140/min, transient hypotension to 94/55, on NRB followed by [facility]NC at 8 liters per minute Since then, she has defervesced and SIRS criteria have resolved Upon extensive workup, no clear source of infection.  Chest x-ray, influenza A/B/COVID/RSV PCR negative.  However procalcitonin up at 9.58 Urine culture negative Septic shock ruled out. 1/3 blood cultures from admission positive for MRSE, suspected to be contaminant.  Initially on IV cefepime , vancomycin  and metronidazole .  As discussed with pharmacy, narrowed down to IV Unasyn  to treat for potential aspiration.   At discharge, transitioned to Augmentin to complete total 7 days course.   Lactic acidosis Presented with serum lactate of 8.2, likely related to DKA, dehydration, AKI and poor tissue perfusion Resolved after IV fluids.   Acute metabolic encephalopathy complicating chronic encephalopathy CT head 5/27:  No acute intracranial abnormality. Stable right posterior approach CSF shunt and ventricle size. Patchy chronic right hemisphere encephalomalacia. Delirium precautions.  Minimize opioids/sedatives. Mental status on 5/30 morning was the best that I have seen in the last 3 days caring for her.  As per family, mental status reportedly has been gradually declining given as outpatient.  This may be close to her recent baseline. Remains alert,  oriented at least to self.   Hyperammonemia Ammonia 128 on 5/27.  Unclear significance in the absence of history of cirrhosis. S/p 2 doses of lactulose  on 5/28.  Ammonia level down to 21.  Serum valproate level subtherapeutic at 32 Resumed home dose of valproate and Lamictal  and monitor clinically.   Normal pressure hydrocephalus s/p VP shunt CTA head as noted above stable.   Endometrial cancer Ongoing XRT.   Anxiety/depression/bipolar disorder Resumed her home meds and monitor closely. Palliative care MD has added clonazepam  1 mg twice daily.   GOC Please refer to palliative care MD Dr. Sherran Dock note from 02/04/2024.  Recommendations are as follows:  DNR [facility] Hospice Referral at LTC No rehospitalization Treat anxiety and mood disorder aggressively. Stop XRT and focus on comfort.   Genital/perineal rash/fungal/Candida) infection/moisture associated skin damage Given poor p.o. intake, given severity of infection, treated with IV Diflucan  x 1 followed by weekly Diflucan  x 3.  Topical nystatin . Air overlay mattress WOC team consultation appreciated.  Follow recommendations Change position every 2 hours. Foley catheter was removed on day of discharge and patient has voided without any postvoid residual.   Pressure Injury 02/01/24 Heel Left Deep Tissue Pressure Injury - Purple or maroon localized area of discolored intact skin or blood-filled blister due to damage of underlying soft tissue from pressure and/or shear. (Active)  02/01/24 1400  Location: Heel  Location Orientation: Left  Staging: Deep Tissue Pressure Injury - Purple or maroon localized area of discolored intact skin or blood-filled blister due to damage of underlying soft tissue from pressure and/or shear.  Wound Description (Comments):   Present on Admission: Yes     Pressure Injury 02/01/24 Heel Right Deep Tissue Pressure Injury - Purple or maroon localized area of discolored intact skin or  blood-filled blister due to damage of underlying soft tissue from pressure and/or shear. (Active)  02/01/24 1400  Location: Heel  Location Orientation: Right  Staging: Deep Tissue Pressure Injury - Purple or maroon localized area of discolored intact skin or blood-filled blister due to damage of underlying soft tissue from pressure and/or shear.  Wound Description (Comments):   Present on Admission: Yes  Agree with WOC RN evaluation and management as noted above.     Hypokalemia/hypomagnesemia Replaced.  Magnesium  1.9.   Anemia May be dilutional.  Stable.   Body mass index is 29.18 kg/m./Overweight     Consultants:   Palliative care medicine   Procedures:       Discharge Instructions  Discharge Instructions     Call MD for:  difficulty breathing, headache or visual disturbances   Complete by: As directed    Call MD for:  extreme fatigue   Complete by: As directed    Call MD for:  persistant dizziness or light-headedness   Complete by: As directed    Call MD for:  persistant nausea and vomiting   Complete by: As directed    Call MD for:  redness, tenderness, or signs of infection (pain, swelling, redness, odor or green/yellow discharge around incision site)   Complete by: As directed    Call MD for:  severe uncontrolled pain   Complete by: As directed    Call MD for:  temperature >100.4   Complete by: As directed    Diet - low sodium heart healthy   Complete by: As directed    Diet Carb Modified   Complete by: As directed    Diet recommendations: Dysphagia 3 (mechanical soft);Thin liquid Liquids provided via: Cup;Straw Medication Administration: Whole meds with puree Supervision: Staff to assist with self feeding Compensations: Slow rate;Small sips/bites;Other (Comment) (start intake with liquids, pt to feed self) Postural Changes and/or Swallow Maneuvers: Seated upright 90 degrees;Upright 30-60 min after meal   Discharge wound care:   Complete by: As directed     Wound care  Every shift      Comments: 1. Apply Gerhardts cream to bilat buttocks BID and PRN when turning and cleaning, then apply antifungal powder over the cream.  2. Medihoney to left ankle wounds Q day (in 3 areas) then cover with foam dressing.  Change foam dressing q 3 days or PRN soiling   Increase activity slowly   Complete by: As directed         Medication List     STOP taking these medications    dapagliflozin propanediol 5 MG Tabs tablet Commonly known as: FARXIGA       TAKE these medications    albuterol  108 (90 Base) MCG/ACT inhaler Commonly known as: VENTOLIN  HFA Inhale 2 puffs into the lungs every 6 (six) hours as needed for wheezing or shortness of breath.   amoxicillin -clavulanate 875-125 MG tablet Commonly known as: AUGMENTIN Take 1 tablet by mouth 2 (two) times daily for 3 days.   atorvastatin  20 MG tablet Commonly known as: LIPITOR Take 1 tablet (20 mg total) by  mouth daily.   Cholecalciferol  25 MCG (1000 UT) tablet Take 1,000 Units by mouth daily.   clonazePAM  1 MG disintegrating tablet Commonly known as: KLONOPIN  Take 1 tablet (1 mg total) by mouth 2 (two) times daily.   divalproex  500 MG DR tablet Commonly known as: DEPAKOTE  Take 500 mg by mouth 3 (three) times daily.   docusate sodium  100 MG capsule Commonly known as: COLACE Take 1 capsule (100 mg total) by mouth 2 (two) times daily as needed for mild constipation. What changed: when to take this   fluconazole  150 MG tablet Commonly known as: DIFLUCAN  Take 1 tablet (150 mg total) by mouth once a week for 3 doses. Start on 02/10/2024. Start taking on: February 10, 2024   Gerhardt's butt cream Crea Apply 1 Application topically 2 (two) times daily. Apply Gerhardts cream to bilat buttocks BID and PRN when turning and cleaning, then apply antifungal powder over the cream.   guaiFENesin 100 MG/5ML liquid Commonly known as: ROBITUSSIN Take 5 mLs by mouth every 4 (four) hours as needed for  cough or to loosen phlegm.   insulin  aspart 100 UNIT/ML injection Commonly known as: novoLOG  Inject 0-9 Units into the skin 3 (three) times daily with meals. CBG < 70: Implement Hypoglycemia protocol; CBG 70 - 120: 0 units CBG 121 - 150: 1 unit CBG 151 - 200: 2 units CBG 201 - 250: 3 units CBG 251 - 300: 5 units CBG 301 - 350: 7 units CBG 351 - 400: 9 units CBG > 400: call MD. What changed:  how much to take additional instructions   insulin  glargine 100 unit/mL Sopn Commonly known as: LANTUS  Inject 10 Units into the skin at bedtime. What changed:  how much to take when to take this   lamoTRIgine  100 MG tablet Commonly known as: LAMICTAL  Take 100 mg by mouth 2 (two) times daily.   leptospermum manuka honey Pste paste Apply 1 Application topically daily. Interchangeable with TheraHoney Medihoney to left ankle wounds Q day (in 3 areas) then cover with foam dressing. Change foam dressing q 3 days or PRN soiling Start taking on: February 06, 2024   multivitamin with minerals tablet Take 1 tablet by mouth daily.   nystatin  powder Apply 1 Application topically 2 (two) times daily. Apply to groin   ondansetron  4 MG tablet Commonly known as: ZOFRAN  Take 4 mg by mouth every 6 (six) hours as needed for nausea or vomiting.   pantoprazole  40 MG tablet Commonly known as: PROTONIX  Take 40 mg by mouth daily.   polyethylene glycol 17 g packet Commonly known as: MIRALAX  / GLYCOLAX  Take 17 g by mouth daily.   zinc  oxide 20 % ointment Apply 1 Application topically in the morning and at bedtime. Apply to groin, buttocks, and vulva       Allergies  Allergen Reactions   Erythromycin Other (See Comments)    Childhood reaction      Procedures/Studies: DG Chest Port 1 View Result Date: 02/01/2024 CLINICAL DATA:  Possible sepsis EXAM: PORTABLE CHEST 1 VIEW COMPARISON:  01/13/2024 FINDINGS: Atherosclerotic calcification of the aortic arch. The lungs appear clear. Tubing projects over the  right chest possibly a VP shunt. Old healed right rib fractures. Cardiac and mediastinal margins appear normal. IMPRESSION: 1. No acute findings. 2. Aortic Atherosclerosis (ICD10-I70.0). Electronically Signed   By: Freida Jes M.D.   On: 02/01/2024 12:00   CT Head Wo Contrast Result Date: 02/01/2024 CLINICAL DATA:  58 year old female with delirium. EXAM: CT HEAD  WITHOUT CONTRAST TECHNIQUE: Contiguous axial images were obtained from the base of the skull through the vertex without intravenous contrast. RADIATION DOSE REDUCTION: This exam was performed according to the departmental dose-optimization program which includes automated exposure control, adjustment of the mA and/or kV according to patient size and/or use of iterative reconstruction technique. COMPARISON:  Head CT 01/15/2024.  Brain MRI 04/01/2021. FINDINGS: Brain: Right posterior approach ventriculostomy catheter is stable terminating in the midline, communicating with the atrium of the right lateral ventricle as before. Stable ventricle size and configuration. Stable mild encephalomalacia along the course of the catheter, chronic right superior frontal gyrus encephalomalacia. No midline shift, mass effect, evidence of mass lesion, intracranial hemorrhage or evidence of cortically based acute infarction. Vascular: Calcified atherosclerosis at the skull base. No suspicious intracranial vascular hyperdensity. Skull: Stable right posterior skull burr hole. Intact otherwise. No acute osseous abnormality identified. Sinuses/Orbits: Visualized paranasal sinuses and mastoids are stable and well aerated. Other: Stable right posterior approach CSF shunt reservoir and tubing. Rightward gaze. IMPRESSION: 1. No acute intracranial abnormality. 2. Stable right posterior approach CSF shunt and ventricle size. Patchy chronic right hemisphere encephalomalacia. Electronically Signed   By: Marlise Simpers M.D.   On: 02/01/2024 11:17     Subjective: Alert and oriented  to self.  When asked, stated that she did not eat breakfast but RN confirmed that she did actually eat 50% of her breakfast and had to be fed.  No other complaints.  Discharge Exam:  Vitals:   02/04/24 1051 02/04/24 2112 02/05/24 0440 02/05/24 1407  BP: 131/74 105/70 124/74 128/71  Pulse: (!) 103 85 86 89  Resp: 18 18 18 20   Temp: 98.3 F (36.8 C) 99 F (37.2 C) 98 F (36.7 C) 98.5 F (36.9 C)  TempSrc:      SpO2: 95% 95% 97% 100%  Weight:      Height:        General exam: Young female, moderately built and obese lying comfortably propped up in bed without distress.  Oral mucosa moist.  Continues to look improved as she did yesterday. Respiratory system: Clear to auscultation. Respiratory effort normal. Cardiovascular system: S1 & S2 heard, RRR. No JVD, murmurs, rubs, gallops or clicks. No pedal edema.  Off telemetry. Gastrointestinal system: Abdomen is nondistended, soft and nontender. No organomegaly or masses felt. Normal bowel sounds heard. Central nervous system: Mental status as noted above. No focal neurological deficits. Extremities: Appears to be moving her limbs spontaneously. Skin: As examined with her female RN at bedside on 5/31: Improved rash compared to 2 days ago -rash in the perineal region, vulval/peri-vaginal area, red, satellite lesions, skin breakdown.  Details as in picture below from admission GU: Has Foley catheter. Psychiatry: Judgement and insight cannot be assessed but appears chronically impaired. Mood & affect pleasant.      The results of significant diagnostics from this hospitalization (including imaging, microbiology, ancillary and laboratory) are listed below for reference.     Microbiology: Recent Results (from the past 240 hours)  Blood Culture (routine x 2)     Status: Abnormal   Collection Time: 02/01/24  9:49 AM   Specimen: BLOOD  Result Value Ref Range Status   Specimen Description   Final    BLOOD LEFT ANTECUBITAL Performed at  [facility], [address] [place], [place] [address]    Special Requests   Final    BOTTLES DRAWN AEROBIC ONLY Blood Culture results may not be optimal due to an inadequate  volume of blood received in culture bottles Performed at [facility], [address] [place], [place] [address]    Culture  Setup Time   Final    GRAM POSITIVE COCCI IN CLUSTERS AEROBIC BOTTLE ONLY CRITICAL RESULT CALLED TO, READ BACK BY AND VERIFIED WITH: PHARMD DREW WOFFORD ON 02/02/24 @ 1748 BY DRT    Culture (A)  Final    STAPHYLOCOCCUS EPIDERMIDIS THE SIGNIFICANCE OF ISOLATING THIS ORGANISM FROM A SINGLE SET OF BLOOD CULTURES WHEN MULTIPLE SETS ARE DRAWN IS UNCERTAIN. PLEASE NOTIFY THE MICROBIOLOGY DEPARTMENT WITHIN ONE WEEK IF SPECIATION AND SENSITIVITIES ARE REQUIRED. Performed at [facility], [address] [place], [place] [address]    Report Status 02/03/2024 FINAL  Final  Blood Culture ID Panel (Reflexed)     Status: Abnormal   Collection Time: 02/01/24  9:49 AM  Result Value Ref Range Status   Enterococcus faecalis NOT DETECTED NOT DETECTED Final   Enterococcus Faecium NOT DETECTED NOT DETECTED Final   Listeria monocytogenes NOT DETECTED NOT DETECTED Final   Staphylococcus species DETECTED (A) NOT DETECTED Final    Comment: CRITICAL RESULT CALLED TO, READ BACK BY AND VERIFIED WITH: PHARMD DREW WOFFORD ON 02/02/24 @ 1748 BY DRT    Staphylococcus aureus (BCID) NOT DETECTED NOT DETECTED Final   Staphylococcus epidermidis DETECTED (A) NOT DETECTED Final    Comment: Methicillin (oxacillin) resistant coagulase negative staphylococcus. Possible blood culture contaminant (unless isolated from more than one blood culture draw or clinical case suggests pathogenicity). No antibiotic treatment is indicated for blood  culture contaminants. CRITICAL RESULT CALLED TO, READ BACK BY AND VERIFIED WITH: PHARMD DREW WOFFORD ON 02/02/24 @ 1748 BY DRT    Staphylococcus  lugdunensis NOT DETECTED NOT DETECTED Final   Streptococcus species NOT DETECTED NOT DETECTED Final   Streptococcus agalactiae NOT DETECTED NOT DETECTED Final   Streptococcus pneumoniae NOT DETECTED NOT DETECTED Final   Streptococcus pyogenes NOT DETECTED NOT DETECTED Final   A.calcoaceticus-baumannii NOT DETECTED NOT DETECTED Final   Bacteroides fragilis NOT DETECTED NOT DETECTED Final   Enterobacterales NOT DETECTED NOT DETECTED Final   Enterobacter cloacae complex NOT DETECTED NOT DETECTED Final   Escherichia coli NOT DETECTED NOT DETECTED Final   Klebsiella aerogenes NOT DETECTED NOT DETECTED Final   Klebsiella oxytoca NOT DETECTED NOT DETECTED Final   Klebsiella pneumoniae NOT DETECTED NOT DETECTED Final   Proteus species NOT DETECTED NOT DETECTED Final   Salmonella species NOT DETECTED NOT DETECTED Final   Serratia marcescens NOT DETECTED NOT DETECTED Final   Haemophilus influenzae NOT DETECTED NOT DETECTED Final   Neisseria meningitidis NOT DETECTED NOT DETECTED Final   Pseudomonas aeruginosa NOT DETECTED NOT DETECTED Final   Stenotrophomonas maltophilia NOT DETECTED NOT DETECTED Final   Candida albicans NOT DETECTED NOT DETECTED Final   Candida auris NOT DETECTED NOT DETECTED Final   Candida glabrata NOT DETECTED NOT DETECTED Final   Candida krusei NOT DETECTED NOT DETECTED Final   Candida parapsilosis NOT DETECTED NOT DETECTED Final   Candida tropicalis NOT DETECTED NOT DETECTED Final   Cryptococcus neoformans/gattii NOT DETECTED NOT DETECTED Final   Methicillin resistance mecA/C DETECTED (A) NOT DETECTED Final    Comment: CRITICAL RESULT CALLED TO, READ BACK BY AND VERIFIED WITH: PHARMD DREW WOFFORD ON 02/02/24 @ 1748 BY DRT Performed at [facility], [address] [place], [place] [address]   Resp panel by RT-PCR (RSV, Flu A&B, Covid) Anterior Nasal Swab     Status: None  Collection Time: 02/01/24 10:10 AM   Specimen: Anterior Nasal Swab  Result Value Ref Range  Status   SARS Coronavirus 2 by RT PCR NEGATIVE NEGATIVE Final    Comment: (NOTE) SARS-CoV-2 target nucleic acids are NOT DETECTED.  The SARS-CoV-2 RNA is generally detectable in upper respiratory specimens during the acute phase of infection. The lowest concentration of SARS-CoV-2 viral copies this assay can detect is 138 copies/mL. A negative result does not preclude SARS-Cov-2 infection and should not be used as the sole basis for treatment or other patient management decisions. A negative result may occur with  improper specimen collection/handling, submission of specimen other than nasopharyngeal swab, presence of viral mutation(s) within the areas targeted by this assay, and inadequate number of viral copies(<138 copies/mL). A negative result must be combined with clinical observations, patient history, and epidemiological information. The expected result is Negative.  Fact Sheet for Patients:  BloggerCourse.com  Fact Sheet for Healthcare Providers:  SeriousBroker.it  This test is no t yet approved or cleared by the [place]  FDA and  has been authorized for detection and/or diagnosis of SARS-CoV-2 by FDA under an Emergency Use Authorization (EUA). This EUA will remain  in effect (meaning this test can be used) for the duration of the COVID-19 declaration under Section 564(b)(1) of the Act, 21 U.S.C.section 360bbb-3(b)(1), unless the authorization is terminated  or revoked sooner.       Influenza A by PCR NEGATIVE NEGATIVE Final   Influenza B by PCR NEGATIVE NEGATIVE Final    Comment: (NOTE) The Xpert Xpress SARS-CoV-2/FLU/RSV plus assay is intended as an aid in the diagnosis of influenza from Nasopharyngeal swab specimens and should not be used as a sole basis for treatment. Nasal washings and aspirates are unacceptable for Xpert Xpress SARS-CoV-2/FLU/RSV testing.  Fact Sheet for  Patients: BloggerCourse.com  Fact Sheet for Healthcare Providers: SeriousBroker.it  This test is not yet approved or cleared by the [place]  FDA and has been authorized for detection and/or diagnosis of SARS-CoV-2 by FDA under an Emergency Use Authorization (EUA). This EUA will remain in effect (meaning this test can be used) for the duration of the COVID-19 declaration under Section 564(b)(1) of the Act, 21 U.S.C. section 360bbb-3(b)(1), unless the authorization is terminated or revoked.     Resp Syncytial Virus by PCR NEGATIVE NEGATIVE Final    Comment: (NOTE) Fact Sheet for Patients: BloggerCourse.com  Fact Sheet for Healthcare Providers: SeriousBroker.it  This test is not yet approved or cleared by the [place]  FDA and has been authorized for detection and/or diagnosis of SARS-CoV-2 by FDA under an Emergency Use Authorization (EUA). This EUA will remain in effect (meaning this test can be used) for the duration of the COVID-19 declaration under Section 564(b)(1) of the Act, 21 U.S.C. section 360bbb-3(b)(1), unless the authorization is terminated or revoked.  Performed at [facility], [address] [place], [place] [address]   Urine Culture     Status: None   Collection Time: 02/01/24  4:00 PM   Specimen: Urine, Random  Result Value Ref Range Status   Specimen Description   Final    URINE, RANDOM Performed at [facility], [address] [place], [place] [address]    Special Requests   Final    NONE Reflexed from [id number] Performed at [facility], [address] [place], [place] [address]    Culture   Final    NO GROWTH Performed at [facility], [address] [place], [place] [address]    Report Status 02/02/2024 FINAL  Final  Blood Culture (routine x 2)     Status: None (Preliminary  result)   Collection Time: 02/01/24  7:55 PM   Specimen: BLOOD RIGHT ARM  Result Value Ref Range Status   Specimen Description   Final    BLOOD RIGHT ARM Performed at [facility], [address], [place], [place] [address]    Special Requests   Final    BOTTLES DRAWN AEROBIC AND ANAEROBIC Blood Culture results may not be optimal due to an inadequate volume of blood received in culture bottles Performed at [facility], [address], [place], [place] [address]    Culture   Final    NO GROWTH 4 DAYS Performed at [facility], [address], [place], [place] [address]    Report Status PENDING  Incomplete  MRSA Next Gen by PCR, Nasal     Status: None   Collection Time: 02/02/24  6:20 AM   Specimen: Nasal Mucosa; Nasal Swab  Result Value Ref Range Status   MRSA by PCR Next Gen NOT DETECTED NOT DETECTED Final    Comment: (NOTE) The GeneXpert MRSA Assay (FDA approved for NASAL specimens only), is one component of a comprehensive MRSA colonization surveillance program. It is not intended to diagnose MRSA infection nor to guide or monitor treatment for MRSA infections. Test performance is not FDA approved in patients less than 44 years old. Performed at [facility], [address], [place], [place] [address]   Respiratory (~20 pathogens) panel by PCR     Status: None   Collection Time: 02/02/24  6:21 AM   Specimen: Nasopharyngeal Swab; Respiratory  Result Value Ref Range Status   Adenovirus NOT DETECTED NOT DETECTED Final   Coronavirus 229E NOT DETECTED NOT DETECTED Final    Comment: (NOTE) The Coronavirus on the Respiratory Panel, DOES NOT test for the novel  Coronavirus (2019 nCoV)    Coronavirus HKU1 NOT DETECTED NOT DETECTED Final   Coronavirus NL63 NOT DETECTED NOT DETECTED Final   Coronavirus OC43 NOT DETECTED NOT DETECTED Final   Metapneumovirus NOT DETECTED NOT DETECTED Final   Rhinovirus / Enterovirus NOT DETECTED NOT  DETECTED Final   Influenza A NOT DETECTED NOT DETECTED Final   Influenza B NOT DETECTED NOT DETECTED Final   Parainfluenza Virus 1 NOT DETECTED NOT DETECTED Final   Parainfluenza Virus 2 NOT DETECTED NOT DETECTED Final   Parainfluenza Virus 3 NOT DETECTED NOT DETECTED Final   Parainfluenza Virus 4 NOT DETECTED NOT DETECTED Final   Respiratory Syncytial Virus NOT DETECTED NOT DETECTED Final   Bordetella pertussis NOT DETECTED NOT DETECTED Final   Bordetella Parapertussis NOT DETECTED NOT DETECTED Final   Chlamydophila pneumoniae NOT DETECTED NOT DETECTED Final   Mycoplasma pneumoniae NOT DETECTED NOT DETECTED Final    Comment: Performed at [facility], [address], [place], [place] [address]     Labs: CBC: Recent Labs  Lab 02/01/24 1009 02/02/24 0614 02/03/24 0314 02/04/24 0327  WBC 14.3* 12.0* 9.1 7.8  NEUTROABS 9.4*  --   --   --   HGB 13.4 12.5 10.9* 11.7*  HCT 47.6* 38.9 33.4* 37.1  MCV 102.6* 92.6 89.5 92.1  PLT 406* 276 263 PLATELET CLUMPS NOTED ON SMEAR, UNABLE TO ESTIMATE    Basic Metabolic Panel: Recent Labs  Lab 02/02/24 0644 02/02/24 1944 02/03/24 0314 02/04/24 0327 02/05/24 1011  NA 144 142 139 139 141  K 3.4* 3.2* 3.3* 4.0 3.9  CL 102 103 102 104 104  CO2 26 27 27 27 27   GLUCOSE 123* 177* 100* 145* 119*  BUN 22* 19 17 16 10   CREATININE 1.66* 1.29* 1.31* 1.14* 0.93  CALCIUM  8.5* 8.0* 7.9* 8.1* 8.0*  MG 1.7 1.5*  --  1.9  --     Liver Function Tests: Recent Labs  Lab 02/01/24 0930 02/02/24 0328  AST 27 66*  ALT 14 20  ALKPHOS 87 57  BILITOT 1.7* 0.7  PROT 7.8 5.5*  ALBUMIN 3.6 2.5*    CBG: Recent Labs  Lab 02/04/24 1144 02/04/24 1624 02/04/24 2111 02/05/24 0825 02/05/24 1129  GLUCAP 123* 190* 152* 71 122*    Urinalysis    Component Value Date/Time   COLORURINE YELLOW 02/01/2024 1600   APPEARANCEUR HAZY (A) 02/01/2024 1600   LABSPEC 1.020 02/01/2024 1600   PHURINE 5.0 02/01/2024 1600   GLUCOSEU >=500 (A) 02/01/2024  1600   HGBUR MODERATE (A) 02/01/2024 1600   BILIRUBINUR NEGATIVE 02/01/2024 1600   KETONESUR 80 (A) 02/01/2024 1600   PROTEINUR 100 (A) 02/01/2024 1600   UROBILINOGEN 0.2 07/20/2014 2022   NITRITE NEGATIVE 02/01/2024 1600   LEUKOCYTESUR SMALL (A) 02/01/2024 1600   I discussed in detail with patient's father/HCPOA via phone, updated care and answered all questions.  He is aware of patient discharging to prior LTC with hospice.   Time coordinating discharge: 45 minutes  SIGNED:  Aubrey Blas, MD,  FACP, [facility], [facility], [facility]   [facility] [place]     To contact the attending provider between 7A-7P or the covering provider during after hours 7P-7A, please log into the web site [email or web address] and access using universal [place] password for that web site. If you do not have the password, please call the hospital operator.

## 2024-02-05 NOTE — [place] FL2 (Signed)
   MEDICAID FL2 LEVEL OF CARE FORM     IDENTIFICATION  Patient Name: Carolyn Norton Birthdate: 1966-02-17 Sex: female Admission Date (Current Location): 02/01/2024  [facility] and [place] Number:  Producer, television/film/video and Address:  [facility],  [address], [place] [address]      Provider Number: [id number]  Attending Physician Name and Address:  Casey Clay, MD  Relative Name and Phone Number:  Kayona, Foor (Father)  [phone] [facility])    Current Level of Care: Hospital Recommended Level of Care: Nursing Facility Prior Approval Number:    Date Approved/Denied:   PASRR Number:    Discharge Plan: Other (Comment) (LTC)    Current Diagnoses: Patient Active Problem List   Diagnosis Date Noted   Septic shock (HCC) 02/01/2024   Acute hypoxic respiratory failure (HCC) 01/14/2024   Acute encephalopathy 01/14/2024   Endometrial cancer (HCC) 01/04/2024   Diabetic ketoacidosis (HCC) 12/23/2023   (Idiopathic) normal pressure hydrocephalus (HCC) 05/29/2022   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) 05/29/2022   Essential hypertension 02/25/2022   Elevated blood-pressure reading, without diagnosis of hypertension 07/29/2021   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 04/29/2021   Protein-calorie malnutrition, severe 03/23/2021   Controlled type 2 diabetes mellitus with hyperglycemia, with long-term current use of insulin  (HCC) 02/11/2021   NPH (normal pressure hydrocephalus) (HCC)    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    Bipolar 1 disorder, depressed, full remission (HCC) 01/18/2021   Generalized weakness 01/11/2021   Acute metabolic encephalopathy 10/22/2020   Hypotension 10/22/2020   Type 2 diabetes mellitus with hyperlipidemia (HCC) 10/22/2020   Altered mental status    Hyperosmolar hyperglycemic state (HHS) (HCC)    Hyperglycemia    Hyperkalemia    Acute kidney injury (HCC)     Hypertension    DKA (diabetic ketoacidosis) (HCC) 09/06/2020   COVID-19 09/06/2020   Bipolar 1 disorder (HCC) 09/06/2020   Breast mass status post excision 09/06/2020   Smoker 09/06/2020   Metabolic acidosis due to ingestion of drugs or chemicals 09/06/2020    Orientation RESPIRATION BLADDER Height & Weight     Self  Normal External catheter Weight: 170 lb (77.1 kg) Height:  5\' 4"  (162.6 cm)  BEHAVIORAL SYMPTOMS/MOOD NEUROLOGICAL BOWEL NUTRITION STATUS      Incontinent Diet (carb modified)  AMBULATORY STATUS COMMUNICATION OF NEEDS Skin   Supervision   Normal                       Personal Care Assistance Level of Assistance  Bathing, Dressing, Feeding Bathing Assistance: Limited assistance Feeding assistance: Limited assistance Dressing Assistance: Limited assistance     Functional Limitations Info  Speech, Hearing, Sight Sight Info: Impaired   Speech Info: Adequate    SPECIAL CARE FACTORS FREQUENCY                       Contractures Contractures Info: Not present    Additional Factors Info                  Current Medications (02/05/2024):  This is the current hospital active medication list Current Facility-Administered Medications  Medication Dose Route Frequency Provider Last Rate Last Admin   acetaminophen  (TYLENOL ) tablet 650 mg  650 mg Oral Q6H PRN Jannette Mend, Mir M, MD       Or   acetaminophen  (TYLENOL ) suppository 650 mg  650 mg Rectal Q6H PRN Gaylin Ke, MD  albuterol  (PROVENTIL ) (2.5 MG/3ML) 0.083% nebulizer solution 2.5 mg  2.5 mg Nebulization Q2H PRN Jannette Mend, Mir M, MD       Ampicillin -Sulbactam (UNASYN ) 3 g in sodium chloride  0.9 % 100 mL IVPB  3 g Intravenous Q6H Hongalgi, Anand D, MD 200 mL/hr at 02/05/24 1022 3 g at 02/05/24 1022   atorvastatin  (LIPITOR) tablet 20 mg  20 mg Oral Daily Jannette Mend, Mir M, MD   20 mg at 02/05/24 1019   Chlorhexidine  Gluconate Cloth 2 % PADS 6 each  6 each Topical Daily Jannette Mend, Mir M,  MD   6 each at 02/04/24 1236   clonazePAM  (KLONOPIN ) disintegrating tablet 1 mg  1 mg Oral BID Golding, Elizabeth L, DO   1 mg at 02/05/24 1019   dextrose  50 % solution 0-50 mL  0-50 mL Intravenous PRN Floyd, Dan, DO       divalproex  (DEPAKOTE ) DR tablet 500 mg  500 mg Oral TID Hongalgi, Anand D, MD   500 mg at 02/05/24 1019   [START ON 02/10/2024] fluconazole  (DIFLUCAN ) tablet 150 mg  150 mg Oral Weekly Hongalgi, Anand D, MD       Gerhardt's butt cream   Topical BID Hongalgi, Anand D, MD   Given at 02/05/24 1020   heparin  injection 5,000 Units  5,000 Units Subcutaneous Q8H Jannette Mend, Mir M, MD   5,000 Units at 02/05/24 1341   insulin  aspart (novoLOG ) injection 0-5 Units  0-5 Units Subcutaneous QHS Hongalgi, Anand D, MD       insulin  aspart (novoLOG ) injection 0-9 Units  0-9 Units Subcutaneous TID WC Hongalgi, Anand D, MD   1 Units at 02/05/24 1210   insulin  glargine-yfgn (SEMGLEE ) injection 12 Units  12 Units Subcutaneous QHS Hongalgi, Anand D, MD   12 Units at 02/04/24 2338   lamoTRIgine  (LAMICTAL ) tablet 100 mg  100 mg Oral BID Hongalgi, Anand D, MD   100 mg at 02/05/24 1019   leptospermum manuka honey (MEDIHONEY) paste 1 Application  1 Application Topical Daily Hongalgi, Anand D, MD   1 Application at 02/05/24 1020   nystatin  (MYCOSTATIN /NYSTOP ) topical powder 1 Application  1 Application Topical BID Gaylin Ke, MD   1 Application at 02/05/24 1020   ondansetron  (ZOFRAN ) tablet 4 mg  4 mg Oral Q6H PRN Gaylin Ke, MD       Or   ondansetron  (ZOFRAN ) injection 4 mg  4 mg Intravenous Q6H PRN Jannette Mend, Mir M, MD       pantoprazole  (PROTONIX ) EC tablet 40 mg  40 mg Oral Daily Delpha Fickle, RPH   40 mg at 02/05/24 1019   zinc  oxide 20 % ointment 1 Application  1 Application Topical TID PRN Gaylin Ke, MD         Discharge Medications: Please see discharge summary for a list of discharge medications.  Relevant Imaging Results:  Relevant Lab Results:   Additional  Information SSN [id number]  Amaryllis Junior, LCSW

## 2024-02-06 LAB — CULTURE, BLOOD (ROUTINE X 2): Culture: NO GROWTH

## 2024-02-07 ENCOUNTER — Ambulatory Visit
Admit: 2024-02-07 | Discharge: 2024-02-07 | Disposition: A | Discharge status: Home / Self Care | Attending: Radiation Oncology | Admitting: Radiation Oncology

## 2024-02-07 ENCOUNTER — Other Ambulatory Visit: Payer: Self-pay

## 2024-02-07 ENCOUNTER — Observation Stay (HOSPITAL_COMMUNITY)
Admission: EM | Admit: 2024-02-07 | Discharge: 2024-02-09 | Disposition: A | Discharge status: Skilled Nursing Facility | Attending: Internal Medicine | Admitting: Internal Medicine

## 2024-02-07 ENCOUNTER — Ambulatory Visit
Admission: RE | Admit: 2024-02-07 | Discharge: 2024-02-07 | Disposition: A | Discharge status: Home / Self Care | Source: Ambulatory Visit | Attending: Radiation Oncology | Admitting: Radiation Oncology

## 2024-02-07 DIAGNOSIS — F17210 Nicotine dependence, cigarettes, uncomplicated: Secondary | ICD-10-CM | POA: Diagnosis not present

## 2024-02-07 DIAGNOSIS — C541 Malignant neoplasm of endometrium: Secondary | ICD-10-CM | POA: Diagnosis not present

## 2024-02-07 DIAGNOSIS — E785 Hyperlipidemia, unspecified: Secondary | ICD-10-CM | POA: Diagnosis not present

## 2024-02-07 DIAGNOSIS — G9341 Metabolic encephalopathy: Principal | ICD-10-CM | POA: Diagnosis present

## 2024-02-07 DIAGNOSIS — R1310 Dysphagia, unspecified: Secondary | ICD-10-CM | POA: Diagnosis not present

## 2024-02-07 DIAGNOSIS — I129 Hypertensive chronic kidney disease with stage 1 through stage 4 chronic kidney disease, or unspecified chronic kidney disease: Secondary | ICD-10-CM | POA: Insufficient documentation

## 2024-02-07 DIAGNOSIS — E1122 Type 2 diabetes mellitus with diabetic chronic kidney disease: Secondary | ICD-10-CM | POA: Insufficient documentation

## 2024-02-07 DIAGNOSIS — F319 Bipolar disorder, unspecified: Secondary | ICD-10-CM | POA: Diagnosis not present

## 2024-02-07 DIAGNOSIS — J45909 Unspecified asthma, uncomplicated: Secondary | ICD-10-CM | POA: Diagnosis not present

## 2024-02-07 DIAGNOSIS — G309 Alzheimer's disease, unspecified: Secondary | ICD-10-CM | POA: Insufficient documentation

## 2024-02-07 DIAGNOSIS — Z794 Long term (current) use of insulin: Secondary | ICD-10-CM | POA: Diagnosis not present

## 2024-02-07 DIAGNOSIS — N189 Chronic kidney disease, unspecified: Secondary | ICD-10-CM | POA: Insufficient documentation

## 2024-02-07 DIAGNOSIS — E1110 Type 2 diabetes mellitus with ketoacidosis without coma: Secondary | ICD-10-CM | POA: Insufficient documentation

## 2024-02-07 DIAGNOSIS — Z510 Encounter for antineoplastic radiation therapy: Secondary | ICD-10-CM | POA: Insufficient documentation

## 2024-02-07 DIAGNOSIS — F419 Anxiety disorder, unspecified: Secondary | ICD-10-CM | POA: Insufficient documentation

## 2024-02-07 DIAGNOSIS — I10 Essential (primary) hypertension: Secondary | ICD-10-CM | POA: Diagnosis present

## 2024-02-07 DIAGNOSIS — R600 Localized edema: Secondary | ICD-10-CM | POA: Diagnosis present

## 2024-02-07 DIAGNOSIS — Z515 Encounter for palliative care: Secondary | ICD-10-CM | POA: Diagnosis not present

## 2024-02-07 DIAGNOSIS — E1169 Type 2 diabetes mellitus with other specified complication: Secondary | ICD-10-CM | POA: Diagnosis present

## 2024-02-07 LAB — BASIC METABOLIC PANEL WITH GFR
Anion gap: 13 (ref 5–15)
BUN: 9 mg/dL (ref 6–20)
CO2: 26 mmol/L (ref 22–32)
Calcium: 8.7 mg/dL — ABNORMAL LOW (ref 8.9–10.3)
Chloride: 96 mmol/L — ABNORMAL LOW (ref 98–111)
Creatinine, Ser: 0.89 mg/dL (ref 0.44–1.00)
GFR, Estimated: >60 mL/min (ref >60)
Glucose, Bld: 199 mg/dL — ABNORMAL HIGH (ref 70–99)
Potassium: 3.5 mmol/L (ref 3.5–5.1)
Sodium: 135 mmol/L (ref 135–145)

## 2024-02-07 LAB — CBC WITH DIFFERENTIAL/PLATELET
Abs Immature Granulocytes: 0.07 10*3/uL (ref 0.00–0.07)
Basophils Absolute: 0 10*3/uL (ref 0.0–0.1)
Basophils Relative: 1 %
Eosinophils Absolute: 0.1 10*3/uL (ref 0.0–0.5)
Eosinophils Relative: 3 %
HCT: 43.6 % (ref 36.0–46.0)
Hemoglobin: 14.2 g/dL (ref 12.0–15.0)
Immature Granulocytes: 2 %
Lymphocytes Relative: 34 %
Lymphs Abs: 1.3 10*3/uL (ref 0.7–4.0)
MCH: 30 pg (ref 26.0–34.0)
MCHC: 32.6 g/dL (ref 30.0–36.0)
MCV: 92 fL (ref 80.0–100.0)
Monocytes Absolute: 0.6 10*3/uL (ref 0.1–1.0)
Monocytes Relative: 16 %
Neutro Abs: 1.7 10*3/uL (ref 1.7–7.7)
Neutrophils Relative %: 44 %
Platelets: 287 10*3/uL (ref 150–400)
RBC: 4.74 MIL/uL (ref 3.87–5.11)
RDW: 16.9 % — ABNORMAL HIGH (ref 11.5–15.5)
WBC: 3.8 10*3/uL — ABNORMAL LOW (ref 4.0–10.5)
nRBC: 0 % (ref 0.0–0.2)

## 2024-02-07 LAB — BETA-HYDROXYBUTYRIC ACID: Beta-Hydroxybutyric Acid: 2.48 mmol/L — ABNORMAL HIGH (ref 0.05–0.27)

## 2024-02-07 LAB — BLOOD GAS, VENOUS
Acid-Base Excess: 2.1 mmol/L — ABNORMAL HIGH (ref 0.0–2.0)
Bicarbonate: 27.8 mmol/L (ref 20.0–28.0)
O2 Saturation: 57.7 %
Patient temperature: 37
pCO2, Ven: 47 mmHg (ref 44–60)
pH, Ven: 7.38 (ref 7.25–7.43)
pO2, Ven: 33 mmHg (ref 32–45)

## 2024-02-07 LAB — HEPATIC FUNCTION PANEL
ALT: 13 U/L (ref 0–44)
AST: 16 U/L (ref 15–41)
Albumin: 2 g/dL — ABNORMAL LOW (ref 3.5–5.0)
Alkaline Phosphatase: 56 U/L (ref 38–126)
Bilirubin, Direct: 0.2 mg/dL (ref 0.0–0.2)
Indirect Bilirubin: 0.7 mg/dL (ref 0.3–0.9)
Total Bilirubin: 0.9 mg/dL (ref 0.0–1.2)
Total Protein: 5 g/dL — ABNORMAL LOW (ref 6.5–8.1)

## 2024-02-07 LAB — GLUCOSE, CAPILLARY
Glucose-Capillary: 153 mg/dL — ABNORMAL HIGH (ref 70–99)
Glucose-Capillary: 247 mg/dL — ABNORMAL HIGH (ref 70–99)

## 2024-02-07 LAB — CBG MONITORING, ED: Glucose-Capillary: 204 mg/dL — ABNORMAL HIGH (ref 70–99)

## 2024-02-07 LAB — VALPROIC ACID LEVEL: Valproic Acid Lvl: 51 ug/mL (ref 50–100)

## 2024-02-07 MED ORDER — DEXTROSE-SODIUM CHLORIDE 5-0.9 % IV SOLN: INTRAVENOUS | Status: AC

## 2024-02-07 MED ORDER — MORPHINE SULFATE (PF) 2 MG/ML IV SOLN: 1.0000 mg | INTRAVENOUS | Status: DC | PRN

## 2024-02-07 MED ORDER — SENNA 8.6 MG PO TABS: 1.0000 | ORAL_TABLET | Freq: Every evening | ORAL | Status: DC | PRN

## 2024-02-07 MED ORDER — INSULIN ASPART 100 UNIT/ML IJ SOLN
0.0000 [IU] | Freq: Three times a day (TID) | INTRAMUSCULAR | Status: DC
  Administered 2024-02-08: 8 [IU] via SUBCUTANEOUS
  Filled 2024-02-07: qty 0.15

## 2024-02-07 MED ORDER — ONDANSETRON 4 MG PO TBDP: 4.0000 mg | ORAL_TABLET | Freq: Four times a day (QID) | ORAL | Status: DC | PRN

## 2024-02-07 MED ORDER — MORPHINE SULFATE (CONCENTRATE) 10 MG /0.5 ML PO SOLN: 5.0000 mg | ORAL | Status: DC | PRN

## 2024-02-07 MED ORDER — MORPHINE SULFATE (CONCENTRATE) 10 MG /0.5 ML PO SOLN
5.0000 mg | ORAL | Status: DC | PRN
  Administered 2024-02-09: 5 mg via SUBLINGUAL
  Filled 2024-02-07: qty 0.5

## 2024-02-07 MED ORDER — FLEET ENEMA RE ENEM: 1.0000 | ENEMA | Freq: Every day | RECTAL | Status: DC | PRN

## 2024-02-07 MED ORDER — INSULIN ASPART 100 UNIT/ML IJ SOLN
0.0000 [IU] | Freq: Every day | INTRAMUSCULAR | Status: DC
  Administered 2024-02-07: 2 [IU] via SUBCUTANEOUS
  Filled 2024-02-07: qty 0.05

## 2024-02-07 MED ORDER — ONDANSETRON HCL 4 MG/2ML IJ SOLN: 4.0000 mg | Freq: Four times a day (QID) | INTRAMUSCULAR | Status: DC | PRN

## 2024-02-07 MED ORDER — METOPROLOL TARTRATE 5 MG/5ML IV SOLN: 10.0000 mg | INTRAVENOUS | Status: DC | PRN

## 2024-02-07 MED ORDER — ALBUTEROL SULFATE (2.5 MG/3ML) 0.083% IN NEBU: 2.5000 mg | INHALATION_SOLUTION | RESPIRATORY_TRACT | Status: DC | PRN

## 2024-02-07 MED ORDER — MAGIC MOUTHWASH: 15.0000 mL | Freq: Four times a day (QID) | ORAL | Status: DC | PRN

## 2024-02-07 MED ORDER — LORAZEPAM 2 MG/ML IJ SOLN
1.0000 mg | INTRAMUSCULAR | Status: DC | PRN
Start: 2024-02-07 — End: 2024-02-09

## 2024-02-07 MED ORDER — BACLOFEN 10 MG PO TABS: 5.0000 mg | ORAL_TABLET | Freq: Two times a day (BID) | ORAL | Status: DC | PRN

## 2024-02-07 MED ORDER — LACTATED RINGERS IV BOLUS
20.0000 mL/kg | Freq: Once | INTRAVENOUS | Status: AC
  Administered 2024-02-07: 1542 mL via INTRAVENOUS

## 2024-02-07 MED ORDER — LORAZEPAM 1 MG PO TABS
1.0000 mg | ORAL_TABLET | ORAL | Status: DC | PRN
Start: 2024-02-07 — End: 2024-02-09

## 2024-02-07 MED ORDER — ACETAMINOPHEN 325 MG PO TABS: 650.0000 mg | ORAL_TABLET | Freq: Four times a day (QID) | ORAL | Status: DC | PRN

## 2024-02-07 MED ORDER — LORAZEPAM 2 MG/ML PO CONC: 1.0000 mg | ORAL | Status: DC | PRN

## 2024-02-07 MED ORDER — ACETAMINOPHEN 650 MG RE SUPP: 650.0000 mg | Freq: Four times a day (QID) | RECTAL | Status: DC | PRN

## 2024-02-07 MED ORDER — BISACODYL 10 MG RE SUPP: 10.0000 mg | Freq: Every day | RECTAL | Status: DC | PRN

## 2024-02-07 MED ORDER — NICOTINE 21 MG/24HR TD PT24: 21.0000 mg | MEDICATED_PATCH | Freq: Every day | TRANSDERMAL | Status: DC | PRN

## 2024-02-07 NOTE — ED Provider Notes (Signed)
 [place] EMERGENCY DEPARTMENT AT [facility] Provider Note   CSN: [id number] Arrival date & time: 02/07/24  1153     History  Chief Complaint  Patient presents with   Cyanosis   Altered Mental Status    Carolyn Norton is a 58 y.o. female.  HPI    58 year old female, resident of ALF, father is her HCPOA, medical history significant for normal pressure hydrocephalus s/p VP shunt 05/29/2022, anxiety/depression/bipolar disorder, endometrial carcinoma with ongoing XRT, type II DM, HLD, HTN, tobacco abuse, prior substance abuse, confused at baseline, has NPH status post VP shunt, currently residing at nursing home since earlier this year and routinely refuses medications at her facility prompting multiple hospital admissions for DKA.  Patient presented to the ER on 5-27, admitted for DKA.  She was discharged on 531.  Patient went to the cancer center today.  The cancer center allegedly noted that patient was cyanotic, had bilateral upper and lower extremity swelling and sent her to the ER.  She was also more confused and sluggish than baseline.  Patient currently responds to verbal and noxious stimuli.  She however only answers yes or no.  She is not following motor commands, but responds to painful stimuli.  I called the patient's father and spoke to him over the phone.  He indicated that patient was seen by family members yesterday.  It seems like hospice is reaching out to them today, to talk about enrollment.  The patient is still not enrolled at this time.  He was made aware of patient being sent to the ER by cancer center.  Home Medications Prior to Admission medications   Medication Sig Start Date End Date Taking? Authorizing Provider  albuterol  (VENTOLIN  HFA) 108 (90 Base) MCG/ACT inhaler Inhale 2 puffs into the lungs every 6 (six) hours as needed for wheezing or shortness of breath.   Yes [provider]  divalproex  (DEPAKOTE ) 500 MG DR tablet Take 500 mg  by mouth 3 (three) times daily.   Yes [provider]  docusate sodium  (COLACE) 100 MG capsule Take 1 capsule (100 mg total) by mouth 2 (two) times daily as needed for mild constipation. Patient taking differently: Take 100 mg by mouth every 12 (twelve) hours as needed for mild constipation. 12/27/23  Yes Barbee Lew, MD  guaiFENesin (ROBITUSSIN) 100 MG/5ML liquid Take 5 mLs by mouth every 4 (four) hours as needed for cough.   Yes [provider]  insulin  aspart (NOVOLOG ) 100 UNIT/ML injection Inject 0-9 Units into the skin 3 (three) times daily with meals. CBG < 70: Implement Hypoglycemia protocol; CBG 70 - 120: 0 units CBG 121 - 150: 1 unit CBG 151 - 200: 2 units CBG 201 - 250: 3 units CBG 251 - 300: 5 units CBG 301 - 350: 7 units CBG 351 - 400: 9 units CBG > 400: call MD. 02/05/24  Yes Hongalgi, Anand D, MD  lamoTRIgine  (LAMICTAL  XR) 100 MG 24 hour tablet Take 100 mg by mouth in the morning and at bedtime.   Yes [provider]  LANTUS  SOLOSTAR 100 UNIT/ML Solostar Pen Inject 10 Units into the skin at bedtime.   Yes [provider]  Multiple Vitamins-Minerals (MULTIVITAMIN WITH MINERALS) tablet Take 1 tablet by mouth daily.   Yes [provider]  nystatin  powder Apply 1 Application topically See admin instructions. Apply to groin area 2 times a day- through 02/20/2024   Yes [provider]  ondansetron  (ZOFRAN ) 4 MG tablet  Take 4 mg by mouth every 6 (six) hours as needed for nausea or vomiting (dissolve in 5 ml's of water - if needed).   Yes [provider]  pantoprazole  (PROTONIX ) 40 MG tablet Take 40 mg by mouth daily.   Yes [provider]  polyethylene glycol (MIRALAX  / GLYCOLAX ) 17 g packet Take 17 g by mouth daily. 04/30/21  Yes Lovell Rubenstein, NP  acetaminophen  (TYLENOL ) 325 MG tablet Take 2 tablets (650 mg total) by mouth every 6 (six) hours as needed for mild pain (pain score 1-3) or fever (or Fever >/= 101).  02/09/24   Sedalia Dacosta, MD  bisacodyl  (DULCOLAX) 10 MG suppository Place 1 suppository (10 mg total) rectally daily as needed for moderate constipation. 02/09/24   Rizwan, Saima, MD  clonazePAM  (KLONOPIN ) 1 MG disintegrating tablet Take 1 tablet (1 mg total) by mouth 2 (two) times daily. 02/09/24   Rizwan, Saima, MD  LORazepam  (ATIVAN ) 2 MG/ML concentrated solution Place 0.5 mLs (1 mg total) under the tongue every 4 (four) hours as needed for anxiety. 02/09/24   Rizwan, Saima, MD  Morphine  Sulfate (MORPHINE  CONCENTRATE) 10 mg / 0.5 ml concentrated solution Take 0.25-0.5 mLs (5-10 mg total) by mouth every 2 (two) hours as needed for moderate pain (pain score 4-6) or severe pain (pain score 7-10) (or dyspnea). 02/09/24   Rizwan, Saima, MD  senna (SENOKOT) 8.6 MG TABS tablet Take 1 tablet (8.6 mg total) by mouth at bedtime as needed for mild constipation. 02/09/24   Rizwan, Saima, MD      Allergies    Erythromycin    Review of Systems   Review of Systems  All other systems reviewed and are negative.   Physical Exam Updated Vital Signs BP 119/69 (BP Location: Left Arm)   Pulse 84   Temp 98.4 F (36.9 C) (Oral)   Resp 18   Ht 5\' 4"  (1.626 m)   Wt 77.9 kg   LMP 01/26/2017 (Approximate)   SpO2 94%   BMI 29.48 kg/m  Physical Exam Vitals and nursing note reviewed.  Constitutional:      Appearance: She is well-developed.     Comments: Response to sternal rub and noxious stimuli  HENT:     Head: Atraumatic.  Eyes:     Pupils: Pupils are equal, round, and reactive to light.     Comments: Pupils are 5 mm, equal  Cardiovascular:     Rate and Rhythm: Normal rate.  Pulmonary:     Effort: Pulmonary effort is normal.  Musculoskeletal:     Cervical back: Normal range of motion and neck supple.     Right lower leg: Edema present.     Left lower leg: Edema present.  Skin:    General: Skin is warm and dry.  Neurological:     Mental Status: She is oriented to person, place, and time.     ED  Results / Procedures / Treatments   Labs (all labs ordered are listed, but only abnormal results are displayed) Labs Reviewed  BASIC METABOLIC PANEL WITH GFR - Abnormal; Notable for the following components:      Result Value   Chloride 96 (*)    Glucose, Bld 199 (*)    Calcium  8.7 (*)    All other components within normal limits  CBC WITH DIFFERENTIAL/PLATELET - Abnormal; Notable for the following components:   WBC 3.8 (*)    RDW 16.9 (*)    All other components within normal limits  BETA-HYDROXYBUTYRIC ACID -  Abnormal; Notable for the following components:   Beta-Hydroxybutyric Acid 2.48 (*)    All other components within normal limits  BLOOD GAS, VENOUS - Abnormal; Notable for the following components:   Acid-Base Excess 2.1 (*)    All other components within normal limits  HEPATIC FUNCTION PANEL - Abnormal; Notable for the following components:   Total Protein 5.0 (*)    Albumin 2.0 (*)    All other components within normal limits  GLUCOSE, CAPILLARY - Abnormal; Notable for the following components:   Glucose-Capillary 247 (*)    All other components within normal limits  GLUCOSE, CAPILLARY - Abnormal; Notable for the following components:   Glucose-Capillary 287 (*)    All other components within normal limits  GLUCOSE, CAPILLARY - Abnormal; Notable for the following components:   Glucose-Capillary 68 (*)    All other components within normal limits  GLUCOSE, CAPILLARY - Abnormal; Notable for the following components:   Glucose-Capillary 201 (*)    All other components within normal limits  GLUCOSE, CAPILLARY - Abnormal; Notable for the following components:   Glucose-Capillary 311 (*)    All other components within normal limits  GLUCOSE, CAPILLARY - Abnormal; Notable for the following components:   Glucose-Capillary 361 (*)    All other components within normal limits  GLUCOSE, CAPILLARY - Abnormal; Notable for the following components:   Glucose-Capillary 366 (*)     All other components within normal limits  CBG MONITORING, ED - Abnormal; Notable for the following components:   Glucose-Capillary 204 (*)    All other components within normal limits  VALPROIC  ACID LEVEL  GLUCOSE, CAPILLARY    EKG EKG Interpretation Date/Time:  Monday February 07 2024 16:21:20 EDT Ventricular Rate:  84 PR Interval:  166 QRS Duration:  92 QT Interval:  381 QTC Calculation: 451 R Axis:   196  Text Interpretation: Right and left arm electrode reversal, interpretation assumes no reversal Sinus rhythm Right axis deviation Low voltage, precordial leads Abnrm T, consider ischemia, anterolateral lds Confirmed by Orvilla Blander ([id number]) on 02/09/2024 12:47:06 AM  Radiology No results found.  Procedures .Critical Care  Performed by: Deatra Face, MD Authorized by: Deatra Face, MD   Critical care provider statement:    Critical care time (minutes):  48   Critical care was necessary to treat or prevent imminent or life-threatening deterioration of the following conditions:  CNS failure or compromise   Critical care was time spent personally by me on the following activities:  Development of treatment plan with patient or surrogate, discussions with consultants, evaluation of patient's response to treatment, examination of patient, ordering and review of laboratory studies, ordering and review of radiographic studies, ordering and performing treatments and interventions, pulse oximetry, re-evaluation of patient's condition, review of old charts and obtaining history from patient or surrogate     Medications Ordered in ED Medications  dextrose  5 %-0.9 % sodium chloride  infusion (0 mLs Intravenous Stopping previously hung infusion 02/09/24 0700)  lactated ringers  bolus 1,542 mL (0 mLs Intravenous Stopped 02/07/24 1726)    ED Course/ Medical Decision Making/ A&P                                 Medical Decision Making Amount and/or Complexity of Data Reviewed Labs:  ordered. Radiology: ordered.  Risk Decision regarding hospitalization.   58 year old patient with pertinent past medical history of endometrial cancer is undergoing radiation therapy, VP  shunt because of NPH, multiple admissions for DKA , currently being evaluated by hospice at the wish of family comes in with chief complaint of acute altered mental status and increasing swelling to the extremity, hypoxia, cyanosis.  Collateral history provided by patient's father.  I have reviewed patient's previous records including the medications and recent discharge summary.  Differential diagnosis considered for this patient includes: ICH / Stroke, acute coronary syndrome, Infection - UTI/Pneumonia/soft tissue infection leading to encephalopathy, encephalopathy due to electrolyte abnormality or drug interactions or toxins or metabolic conditions like adrenal insufficiency, hyperglycemia, paraneoplastic process, pulmonary embolism.  Additionally, the swelling could be simply third spacing from fluid resuscitation patient received from her recent admission for DKA and low albumin state.  Based on initial assessment, higher suspicion for third spacing due to fluid resuscitation, low albumin state, possible DKA, occult infection and electrolyte abnormality.  Low suspicion for brain bleed.  Plan is to get basic labs and reassess the patient.  Reassessment: I have independently reviewed the following labs: Patient's blood sugar is normal.  She has elevated beta hydroxybutyrate acid, but no anion gap and the bicarb is normal.  This is clinically not DKA.  Patient was also reassessed at 4 PM, her status is unchanged.  I think it would be best to admit the patient for acute encephalopathy for ops status.  We we will get CT PE and CT head without contrast.  I have consulted TOC to see if they can enroll the patient while she is in the hospital.    Final Clinical Impression(s) / ED Diagnoses Final  diagnoses:  Acute metabolic encephalopathy    Rx / DC Orders ED Discharge Orders          Ordered    clonazePAM  (KLONOPIN ) 1 MG disintegrating tablet  2 times daily        02/09/24 1129    bisacodyl  (DULCOLAX) 10 MG suppository  Daily PRN        02/09/24 1129    acetaminophen  (TYLENOL ) 325 MG tablet  Every 6 hours PRN        02/09/24 1129    Morphine  Sulfate (MORPHINE  CONCENTRATE) 10 mg / 0.5 ml concentrated solution  Every 2 hours PRN        02/09/24 1129    senna (SENOKOT) 8.6 MG TABS tablet  At bedtime PRN        02/09/24 1129    LORazepam  (ATIVAN ) 2 MG/ML concentrated solution  Every 4 hours PRN        02/09/24 1129              Deatra Face, MD 02/14/24 2033

## 2024-02-07 NOTE — H&P (Signed)
 History and Physical    Carolyn Norton WUX:[id number] DOB: 07/02/1966 DOA: 02/07/2024  PCP: Dickey Fought, PA Patient coming from: [facility]  Chief Complaint: Cyanosis  HPI: Carolyn Norton is a 58 y.o. female with medical history significant of NPH status post VP shunt 2023, anxiety/depression, bipolar, DM 2, HTN, HLD, tobacco use, endometrial carcinoma with ongoing XRT, Alzheimer's sent to the hospital from cancer center for concerns of bilateral upper extremity swelling and cyanosis.  Apparently patient was admitted to the hospital from 5/27 - 02/05/24 for DKA, dehydration, AKI and fever.  After appropriate medical treatment patient was discharged back to her long-term care facility on 5/31 with plans to transition her to hospice with [facility].  It was also determined not to hospitalize patient at that time. I am not sure but why but patient was taken to her radiation treatment today where she was noted to have bilateral upper extremity swelling, slow to respond and cold arms therefore she was brought to the hospital for further evaluation.  In the ER routine blood work was unremarkable besides slightly elevated beta hydroxy acid.  I spoke with Dr. Glady Laming from palliative and liaison from the [facility] including Carolyn Norton and Carolyn Norton regarding recent postdischarge plans.  At this time patient is not actively enrolled in hospice, attempts were made earlier today but due to some insurance issue this has not happened yet.  Due to unsafe discharge with lack of clear plan we will admit the patient.  Hospitalist service will see the patient tomorrow and will discuss with TOC regarding next steps.  I did speak with patient's father Carolyn Norton regarding the plan.   Review of Systems: As per HPI otherwise 10 point review of systems negative.    Past Medical History:  Diagnosis Date   Alzheimer disease (HCC)    from facility diagnosis, 12/10/22- orientation has improved since she had  shunt placed.   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   COVID    History of   Depression    Diabetes mellitus without complication (HCC)    Type 2 (type 1 per patient and aunt)   Dyspnea    Hyperlipidemia    Hypertension    Normal pressure hydrocephalus (HCC)    Seasonal allergies     Past Surgical History:  Procedure Laterality Date   BREAST EXCISIONAL BIOPSY Right    BREAST SURGERY     right breast   CATARACT EXTRACTION W/PHACO Left 02/12/2023   Procedure: CATARACT EXTRACTION PHACO AND INTRAOCULAR LENS PLACEMENT (IOC);  Surgeon: Tarri Farm, MD;  Location: [facility];  Service: Ophthalmology;  Laterality: Left;  CDE: 22.05   CATARACT EXTRACTION W/PHACO Right 02/26/2023   Procedure: CATARACT EXTRACTION PHACO AND INTRAOCULAR LENS PLACEMENT (IOC);  Surgeon: Tarri Farm, MD;  Location: [facility];  Service: Ophthalmology;  Laterality: Right;  CDE: 24.40   TOOTH EXTRACTION N/A 12/11/2022   Procedure: DENTAL RESTORATION/EXTRACTIONS;  Surgeon: Ascencion Lava, DMD;  Location: [facility] OR;  Service: Oral Surgery;  Laterality: N/A;   VENTRICULOPERITONEAL SHUNT N/A 05/29/2022   Procedure: Ventriculoperitoneal shunt placement;  Surgeon: Gearl Keens, MD;  Location[facility] OR;  Service: Neurosurgery;  Laterality: N/A;    SOCIAL HISTORY:  reports that she has been smoking cigarettes. She started smoking about 13 months ago. She has a 0.5 pack-year smoking history. She has never used smokeless tobacco. She reports that  she does not currently use drugs after having used the following drugs: Cocaine. She reports that she does not drink alcohol.  Allergies  Allergen Reactions   Erythromycin Other (See Comments)    Childhood reaction    FAMILY HISTORY: Family History  Problem Relation Age of Onset   Hypertension Mother    Kidney cancer Mother    Prostate cancer Father    Leukemia Brother    Breast cancer Paternal Grandmother     Endometrial cancer Neg Hx    Colon cancer Neg Hx    Ovarian cancer Neg Hx      Prior to Admission medications   Medication Sig Start Date End Date Taking? Authorizing Provider  albuterol  (VENTOLIN  HFA) 108 (90 Base) MCG/ACT inhaler Inhale 2 puffs into the lungs every 6 (six) hours as needed for wheezing or shortness of breath.    [provider]  amoxicillin -clavulanate (AUGMENTIN) 875-125 MG tablet Take 1 tablet by mouth 2 (two) times daily for 3 days. 02/05/24 02/08/24  Hongalgi, Anand D, MD  atorvastatin  (LIPITOR) 20 MG tablet Take 1 tablet (20 mg total) by mouth daily. 04/29/21   Lovell Rubenstein, NP  Cholecalciferol  25 MCG (1000 UT) tablet Take 1,000 Units by mouth daily. Patient not taking: Reported on 02/01/2024    [provider]  clonazePAM  (KLONOPIN ) 1 MG disintegrating tablet Take 1 tablet (1 mg total) by mouth 2 (two) times daily. 02/05/24   Hongalgi, Anand D, MD  divalproex  (DEPAKOTE ) 500 MG DR tablet Take 500 mg by mouth 3 (three) times daily.    [provider]  docusate sodium  (COLACE) 100 MG capsule Take 1 capsule (100 mg total) by mouth 2 (two) times daily as needed for mild constipation. Patient taking differently: Take 100 mg by mouth 2 (two) times daily. 12/27/23   Barbee Lew, MD  fluconazole  (DIFLUCAN ) 150 MG tablet Take 1 tablet (150 mg total) by mouth once a week for 3 doses. Start on 02/10/2024. 02/10/24 02/25/24  Hongalgi, Anand D, MD  guaiFENesin (ROBITUSSIN) 100 MG/5ML liquid Take 5 mLs by mouth every 4 (four) hours as needed for cough or to loosen phlegm.    [provider]  insulin  aspart (NOVOLOG ) 100 UNIT/ML injection Inject 0-9 Units into the skin 3 (three) times daily with meals. CBG < 70: Implement Hypoglycemia protocol; CBG 70 - 120: 0 units CBG 121 - 150: 1 unit CBG 151 - 200: 2 units CBG 201 - 250: 3 units CBG 251 - 300: 5 units CBG 301 - 350: 7 units CBG 351 - 400: 9 units CBG > 400: call MD. 02/05/24   Hongalgi, Anand D, MD   insulin  glargine (LANTUS ) 100 unit/mL SOPN Inject 10 Units into the skin at bedtime. 02/05/24   Hongalgi, Anand D, MD  lamoTRIgine  (LAMICTAL ) 100 MG tablet Take 100 mg by mouth 2 (two) times daily.    [provider]  leptospermum manuka honey (MEDIHONEY) PSTE paste Apply 1 Application topically daily. Interchangeable with TheraHoney Medihoney to left ankle wounds Q day (in 3 areas) then cover with foam dressing. Change foam dressing q 3 days or PRN soiling 02/06/24   Hongalgi, Anand D, MD  Multiple Vitamins-Minerals (MULTIVITAMIN WITH MINERALS) tablet Take 1 tablet by mouth daily.    [provider]  Nystatin  (GERHARDT'S BUTT CREAM) CREA Apply 1 Application topically 2 (two) times daily. Apply Gerhardts cream to bilat buttocks BID and PRN when turning and cleaning, then apply antifungal powder over the cream. 02/05/24  Hongalgi, Anand D, MD  nystatin  powder Apply 1 Application topically 2 (two) times daily. Apply to groin    [provider]  ondansetron  (ZOFRAN ) 4 MG tablet Take 4 mg by mouth every 6 (six) hours as needed for nausea or vomiting.    [provider]  pantoprazole  (PROTONIX ) 40 MG tablet Take 40 mg by mouth daily.    [provider]  polyethylene glycol (MIRALAX  / GLYCOLAX ) 17 g packet Take 17 g by mouth daily. 04/30/21   Lovell Rubenstein, NP  zinc  oxide 20 % ointment Apply 1 Application topically in the morning and at bedtime. Apply to groin, buttocks, and vulva    [provider]    Physical Exam: Vitals:   02/07/24 1204 02/07/24 1500 02/07/24 1607  BP: 138/88 131/75   Pulse: 87 78   Resp: 18 16   Temp: 97.6 F (36.4 C)  98.4 F (36.9 C)  TempSrc: Oral  Axillary  SpO2: 98% 100%       Constitutional: NAD, calm, comfortable Eyes: PERRL, lids and conjunctivae normal ENMT: Mucous membranes are moist. Posterior pharynx clear of any exudate or lesions.Normal dentition.  Neck: normal, supple, no masses, no  thyromegaly Respiratory: clear to auscultation bilaterally, no wheezing, no crackles. Normal respiratory effort. No accessory muscle use.  Cardiovascular: Regular rate and rhythm, no murmurs / rubs / gallops. No extremity edema. 2+ pedal pulses. No carotid bruits.  Abdomen: no tenderness, no masses palpated. No hepatosplenomegaly. Bowel sounds positive.  Musculoskeletal: Bilateral upper extremity swelling/edema and cool to touch.  No obvious cyanosis noted. Skin: no rashes, lesions, ulcers. No induration Neurologic: CN 2-12 grossly intact. Sensation intact, DTR normal. Strength 4/5 in all 4.  Psychiatric: Very sluggish to respond but responds appropriately   There is no height or weight on file to calculate BMI.      Labs on Admission: I have personally reviewed following labs and imaging studies  CBC: Recent Labs  Lab 02/01/24 1009 02/02/24 0614 02/03/24 0314 02/04/24 0327 02/07/24 1235  WBC 14.3* 12.0* 9.1 7.8 3.8*  NEUTROABS 9.4*  --   --   --  1.7  HGB 13.4 12.5 10.9* 11.7* 14.2  HCT 47.6* 38.9 33.4* 37.1 43.6  MCV 102.6* 92.6 89.5 92.1 92.0  PLT 406* 276 263 PLATELET CLUMPS NOTED ON SMEAR, UNABLE TO ESTIMATE 287   Basic Metabolic Panel: Recent Labs  Lab 02/02/24 0644 02/02/24 1944 02/03/24 0314 02/04/24 0327 02/05/24 1011 02/07/24 1235  NA 144 142 139 139 141 135  K 3.4* 3.2* 3.3* 4.0 3.9 3.5  CL 102 103 102 104 104 96*  CO2 26 27 27 27 27 26   GLUCOSE 123* 177* 100* 145* 119* 199*  BUN 22* 19 17 16 10 9   CREATININE 1.66* 1.29* 1.31* 1.14* 0.93 0.89  CALCIUM  8.5* 8.0* 7.9* 8.1* 8.0* 8.7*  MG 1.7 1.5*  --  1.9  --   --    GFR: Estimated Creatinine Clearance: 71 mL/min (by C-G formula based on SCr of 0.89 mg/dL). Liver Function Tests: Recent Labs  Lab 02/01/24 0930 02/02/24 0328 02/07/24 1647  AST 27 66* 16  ALT 14 20 13   ALKPHOS 87 57 56  BILITOT 1.7* 0.7 0.9  PROT 7.8 5.5* 5.0*  ALBUMIN 3.6 2.5* 2.0*   No results for input(s): "LIPASE", "AMYLASE"  in the last 168 hours. Recent Labs  Lab 02/01/24 1118 02/03/24 0314  AMMONIA 128* 21   Coagulation Profile: Recent Labs  Lab 02/01/24 1009  INR 1.5*  Cardiac Enzymes: No results for input(s): "CKTOTAL", "CKMB", "CKMBINDEX", "TROPONINI" in the last 168 hours. BNP (last 3 results) No results for input(s): "PROBNP" in the last 8760 hours. HbA1C: No results for input(s): "HGBA1C" in the last 72 hours. CBG: Recent Labs  Lab 02/05/24 0825 02/05/24 1129 02/05/24 1633 02/07/24 1119 02/07/24 1412  GLUCAP 71 122* 190* 153* 204*   Lipid Profile: No results for input(s): "CHOL", "HDL", "LDLCALC", "TRIG", "CHOLHDL", "LDLDIRECT" in the last 72 hours. Thyroid Function Tests: No results for input(s): "TSH", "T4TOTAL", "FREET4", "T3FREE", "THYROIDAB" in the last 72 hours. Anemia Panel: No results for input(s): "VITAMINB12", "FOLATE", "FERRITIN", "TIBC", "IRON", "RETICCTPCT" in the last 72 hours. Urine analysis:    Component Value Date/Time   COLORURINE YELLOW 02/01/2024 1600   APPEARANCEUR HAZY (A) 02/01/2024 1600   LABSPEC 1.020 02/01/2024 1600   PHURINE 5.0 02/01/2024 1600   GLUCOSEU >=500 (A) 02/01/2024 1600   HGBUR MODERATE (A) 02/01/2024 1600   BILIRUBINUR NEGATIVE 02/01/2024 1600   KETONESUR 80 (A) 02/01/2024 1600   PROTEINUR 100 (A) 02/01/2024 1600   UROBILINOGEN 0.2 07/20/2014 2022   NITRITE NEGATIVE 02/01/2024 1600   LEUKOCYTESUR SMALL (A) 02/01/2024 1600   Sepsis Labs: !!!!!!!!!!!!!!!!!!!!!!!!!!!!!!!!!!!!!!!!!!!! @LABRCNTIP (procalcitonin:4,lacticidven:4) ) Recent Results (from the past 240 hours)  Blood Culture (routine x 2)     Status: Abnormal   Collection Time: 02/01/24  9:49 AM   Specimen: BLOOD  Result Value Ref Range Status   Specimen Description   Final    BLOOD LEFT ANTECUBITAL Performed at [facility], [address] [place], [place] [address]    Special Requests   Final    BOTTLES DRAWN AEROBIC ONLY Blood Culture results may not  be optimal due to an inadequate volume of blood received in culture bottles Performed at [facility], [address] [place], [place] [address]    Culture  Setup Time   Final    GRAM POSITIVE COCCI IN CLUSTERS AEROBIC BOTTLE ONLY CRITICAL RESULT CALLED TO, READ BACK BY AND VERIFIED WITH: PHARMD DREW WOFFORD ON 02/02/24 @ 1748 BY DRT    Culture (A)  Final    STAPHYLOCOCCUS EPIDERMIDIS THE SIGNIFICANCE OF ISOLATING THIS ORGANISM FROM A SINGLE SET OF BLOOD CULTURES WHEN MULTIPLE SETS ARE DRAWN IS UNCERTAIN. PLEASE NOTIFY THE MICROBIOLOGY DEPARTMENT WITHIN ONE WEEK IF SPECIATION AND SENSITIVITIES ARE REQUIRED. Performed at [facility], [address] [place], [place] [address]    Report Status 02/03/2024 FINAL  Final  Blood Culture ID Panel (Reflexed)     Status: Abnormal   Collection Time: 02/01/24  9:49 AM  Result Value Ref Range Status   Enterococcus faecalis NOT DETECTED NOT DETECTED Final   Enterococcus Faecium NOT DETECTED NOT DETECTED Final   Listeria monocytogenes NOT DETECTED NOT DETECTED Final   Staphylococcus species DETECTED (A) NOT DETECTED Final    Comment: CRITICAL RESULT CALLED TO, READ BACK BY AND VERIFIED WITH: PHARMD DREW WOFFORD ON 02/02/24 @ 1748 BY DRT    Staphylococcus aureus (BCID) NOT DETECTED NOT DETECTED Final   Staphylococcus epidermidis DETECTED (A) NOT DETECTED Final    Comment: Methicillin (oxacillin) resistant coagulase negative staphylococcus. Possible blood culture contaminant (unless isolated from more than one blood culture draw or clinical case suggests pathogenicity). No antibiotic treatment is indicated for blood  culture contaminants. CRITICAL RESULT CALLED TO, READ BACK BY AND VERIFIED WITH: PHARMD DREW WOFFORD ON 02/02/24 @ 1748 BY DRT    Staphylococcus lugdunensis NOT DETECTED NOT DETECTED Final  Streptococcus species NOT DETECTED NOT DETECTED Final   Streptococcus agalactiae NOT DETECTED NOT DETECTED Final    Streptococcus pneumoniae NOT DETECTED NOT DETECTED Final   Streptococcus pyogenes NOT DETECTED NOT DETECTED Final   A.calcoaceticus-baumannii NOT DETECTED NOT DETECTED Final   Bacteroides fragilis NOT DETECTED NOT DETECTED Final   Enterobacterales NOT DETECTED NOT DETECTED Final   Enterobacter cloacae complex NOT DETECTED NOT DETECTED Final   Escherichia coli NOT DETECTED NOT DETECTED Final   Klebsiella aerogenes NOT DETECTED NOT DETECTED Final   Klebsiella oxytoca NOT DETECTED NOT DETECTED Final   Klebsiella pneumoniae NOT DETECTED NOT DETECTED Final   Proteus species NOT DETECTED NOT DETECTED Final   Salmonella species NOT DETECTED NOT DETECTED Final   Serratia marcescens NOT DETECTED NOT DETECTED Final   Haemophilus influenzae NOT DETECTED NOT DETECTED Final   Neisseria meningitidis NOT DETECTED NOT DETECTED Final   Pseudomonas aeruginosa NOT DETECTED NOT DETECTED Final   Stenotrophomonas maltophilia NOT DETECTED NOT DETECTED Final   Candida albicans NOT DETECTED NOT DETECTED Final   Candida auris NOT DETECTED NOT DETECTED Final   Candida glabrata NOT DETECTED NOT DETECTED Final   Candida krusei NOT DETECTED NOT DETECTED Final   Candida parapsilosis NOT DETECTED NOT DETECTED Final   Candida tropicalis NOT DETECTED NOT DETECTED Final   Cryptococcus neoformans/gattii NOT DETECTED NOT DETECTED Final   Methicillin resistance mecA/C DETECTED (A) NOT DETECTED Final    Comment: CRITICAL RESULT CALLED TO, READ BACK BY AND VERIFIED WITH: PHARMD DREW WOFFORD ON 02/02/24 @ 1748 BY DRT Performed at [facility], [address], [place], [place] [address]   Resp panel by RT-PCR (RSV, Flu A&B, Covid) Anterior Nasal Swab     Status: None   Collection Time: 02/01/24 10:10 AM   Specimen: Anterior Nasal Swab  Result Value Ref Range Status   SARS Coronavirus 2 by RT PCR NEGATIVE NEGATIVE Final    Comment: (NOTE) SARS-CoV-2 target nucleic acids are NOT DETECTED.  The SARS-CoV-2 RNA is  generally detectable in upper respiratory specimens during the acute phase of infection. The lowest concentration of SARS-CoV-2 viral copies this assay can detect is 138 copies/mL. A negative result does not preclude SARS-Cov-2 infection and should not be used as the sole basis for treatment or other patient management decisions. A negative result may occur with  improper specimen collection/handling, submission of specimen other than nasopharyngeal swab, presence of viral mutation(s) within the areas targeted by this assay, and inadequate number of viral copies(<138 copies/mL). A negative result must be combined with clinical observations, patient history, and epidemiological information. The expected result is Negative.  Fact Sheet for Patients:  BloggerCourse.com  Fact Sheet for Healthcare Providers:  SeriousBroker.it  This test is no t yet approved or cleared by the [place]  FDA and  has been authorized for detection and/or diagnosis of SARS-CoV-2 by FDA under an Emergency Use Authorization (EUA). This EUA will remain  in effect (meaning this test can be used) for the duration of the COVID-19 declaration under Section 564(b)(1) of the Act, 21 U.S.C.section 360bbb-3(b)(1), unless the authorization is terminated  or revoked sooner.       Influenza A by PCR NEGATIVE NEGATIVE Final   Influenza B by PCR NEGATIVE NEGATIVE Final    Comment: (NOTE) The Xpert Xpress SARS-CoV-2/FLU/RSV plus assay is intended as an aid in the diagnosis of influenza from Nasopharyngeal swab specimens and should not be used as a sole basis for treatment. Nasal washings and aspirates are unacceptable  for Xpert Xpress SARS-CoV-2/FLU/RSV testing.  Fact Sheet for Patients: BloggerCourse.com  Fact Sheet for Healthcare Providers: SeriousBroker.it  This test is not yet approved or cleared by the [place] FDA and has been authorized for detection and/or diagnosis of SARS-CoV-2 by FDA under an Emergency Use Authorization (EUA). This EUA will remain in effect (meaning this test can be used) for the duration of the COVID-19 declaration under Section 564(b)(1) of the Act, 21 U.S.C. section 360bbb-3(b)(1), unless the authorization is terminated or revoked.     Resp Syncytial Virus by PCR NEGATIVE NEGATIVE Final    Comment: (NOTE) Fact Sheet for Patients: BloggerCourse.com  Fact Sheet for Healthcare Providers: SeriousBroker.it  This test is not yet approved or cleared by the [place]  FDA and has been authorized for detection and/or diagnosis of SARS-CoV-2 by FDA under an Emergency Use Authorization (EUA). This EUA will remain in effect (meaning this test can be used) for the duration of the COVID-19 declaration under Section 564(b)(1) of the Act, 21 U.S.C. section 360bbb-3(b)(1), unless the authorization is terminated or revoked.  Performed at [facility], [address], [place], [place] [address]   Urine Culture     Status: None   Collection Time: 02/01/24  4:00 PM   Specimen: Urine, Random  Result Value Ref Range Status   Specimen Description   Final    URINE, RANDOM Performed at [facility], [address], [place], [place] [address]    Special Requests   Final    NONE Reflexed from [id number] Performed at [facility], [address], [place], [place] [address]    Culture   Final    NO GROWTH Performed at [facility], [address] [place], [place] [address]    Report Status 02/02/2024 FINAL  Final  Blood Culture (routine x 2)     Status: None   Collection Time: 02/01/24  7:55 PM   Specimen: BLOOD RIGHT ARM  Result Value Ref Range Status   Specimen Description   Final    BLOOD RIGHT ARM Performed at [facility], [address]  [place], [place] [address]    Special Requests   Final    BOTTLES DRAWN AEROBIC AND ANAEROBIC Blood Culture results may not be optimal due to an inadequate volume of blood received in culture bottles Performed at [facility], [address] [place], [place] [address]    Culture   Final    NO GROWTH 5 DAYS Performed at [facility], [address] [place], [place] [address]    Report Status 02/06/2024 FINAL  Final  MRSA Next Gen by PCR, Nasal     Status: None   Collection Time: 02/02/24  6:20 AM   Specimen: Nasal Mucosa; Nasal Swab  Result Value Ref Range Status   MRSA by PCR Next Gen NOT DETECTED NOT DETECTED Final    Comment: (NOTE) The GeneXpert MRSA Assay (FDA approved for NASAL specimens only), is one component of a comprehensive MRSA colonization surveillance program. It is not intended to diagnose MRSA infection nor to guide or monitor treatment for MRSA infections. Test performance is not FDA approved in patients less than 15 years old. Performed at [facility], [address] [place], [place] [address]   Respiratory (~20 pathogens) panel by PCR     Status: None   Collection Time: 02/02/24  6:21 AM   Specimen: Nasopharyngeal Swab; Respiratory  Result  Value Ref Range Status   Adenovirus NOT DETECTED NOT DETECTED Final   Coronavirus 229E NOT DETECTED NOT DETECTED Final    Comment: (NOTE) The Coronavirus on the Respiratory Panel, DOES NOT test for the novel  Coronavirus (2019 nCoV)    Coronavirus HKU1 NOT DETECTED NOT DETECTED Final   Coronavirus NL63 NOT DETECTED NOT DETECTED Final   Coronavirus OC43 NOT DETECTED NOT DETECTED Final   Metapneumovirus NOT DETECTED NOT DETECTED Final   Rhinovirus / Enterovirus NOT DETECTED NOT DETECTED Final   Influenza A NOT DETECTED NOT DETECTED Final   Influenza B NOT DETECTED NOT DETECTED Final   Parainfluenza Virus 1 NOT DETECTED NOT DETECTED Final   Parainfluenza Virus 2 NOT DETECTED NOT  DETECTED Final   Parainfluenza Virus 3 NOT DETECTED NOT DETECTED Final   Parainfluenza Virus 4 NOT DETECTED NOT DETECTED Final   Respiratory Syncytial Virus NOT DETECTED NOT DETECTED Final   Bordetella pertussis NOT DETECTED NOT DETECTED Final   Bordetella Parapertussis NOT DETECTED NOT DETECTED Final   Chlamydophila pneumoniae NOT DETECTED NOT DETECTED Final   Mycoplasma pneumoniae NOT DETECTED NOT DETECTED Final    Comment: Performed at [facility], [address], [place], [place] [address]     Radiological Exams on Admission: No results found.  Nutritional status  All images have been reviewed by me personally.    Assessment/Plan Principal Problem:   Comfort measures only status Active Problems:   Acute metabolic encephalopathy   Type 2 diabetes mellitus with hyperlipidemia (HCC)   Essential hypertension   Endometrial cancer (HCC)    Bilateral upper extremity swelling/edema -Bilateral upper extremity swelling with cold to touch.  Suspect poor circulation in the setting of swelling and low albumin.  For now we will try and elevate her arms.  Patient is comfort care otherwise would have pursued further workup including bilateral upper extremity ultrasounds and further imaging of her neck/thoracic area.  Hospice/comfort care - Comfort care orders have been placed.  I have personally clarified goals of care with patient's father who is a healthcare power of attorney.  He understands I will admit patient for comfort care and TOC will discuss with hospice team regarding next steps -Regular diet, comfort feeding.  Holding off on routine home medication and avoiding any further blood draws  DM 2 Dysphagia Endometrial cancer Bipolar disorder/anxiety -Will order sliding scale for now otherwise holding off on other medications.    DVT prophylaxis: None Code Status: DNR Family Communication: Father updated by me Consults called: Hospice team to see the patient in the  morning Admission status: Ration MedSurg  Placed under observation until we have safe disposition   Time Spent: 65 minutes.  >50% of the time was devoted to discussing the patients care, assessment, plan and disposition with other care givers along with counseling the patient about the risks and benefits of treatment.    Maggie Schooner MD [facility]  If 7PM-7AM, please contact night-coverage   02/07/2024, 6:38 PM

## 2024-02-07 NOTE — Progress Notes (Signed)
 Patient in rad onc waiting room. Patient was unable to sit upright in wheelchair.  Aide that accompanied patient from facility reports that patents status changed during transport to the Cancer Center. Patient was slow to respond, complains of pain all over. Noted patients bilateral upper extremities to be edematous and cold. Called and spoke with nurse at [facility] to check on patients status prior to leaving the facility. Per nurse patient was slow to respond but took all morning medications before leaving. Dr. Lurena Sally in to assess patient and requested patient be taken to the ED for evaluation. Called and made POA aware.   VS 134/82, 20,97.2,88 blood sugar 153

## 2024-02-07 NOTE — ED Triage Notes (Signed)
 Pt presents to the ED from cancer center for concerns of increasing cyanosis and swelling in bilateral arms. Patients father stated that he began to notice this happening ever since the patient was discharged on Saturday. The cancer center staff as mentions that the patient has been slower to respond over the past couple of days.

## 2024-02-08 ENCOUNTER — Encounter (HOSPITAL_COMMUNITY): Payer: Self-pay | Admitting: Internal Medicine

## 2024-02-08 ENCOUNTER — Ambulatory Visit

## 2024-02-08 DIAGNOSIS — C541 Malignant neoplasm of endometrium: Secondary | ICD-10-CM | POA: Diagnosis not present

## 2024-02-08 LAB — GLUCOSE, CAPILLARY
Glucose-Capillary: 287 mg/dL — ABNORMAL HIGH (ref 70–99)
Glucose-Capillary: 68 mg/dL — ABNORMAL LOW (ref 70–99)
Glucose-Capillary: 201 mg/dL — ABNORMAL HIGH (ref 70–99)
Glucose-Capillary: 311 mg/dL — ABNORMAL HIGH (ref 70–99)
Glucose-Capillary: 361 mg/dL — ABNORMAL HIGH (ref 70–99)

## 2024-02-08 MED ORDER — INSULIN ASPART 100 UNIT/ML IJ SOLN
0.0000 [IU] | Freq: Three times a day (TID) | INTRAMUSCULAR | Status: DC
  Administered 2024-02-08: 7 [IU] via SUBCUTANEOUS
  Administered 2024-02-09: 9 [IU] via SUBCUTANEOUS

## 2024-02-08 MED ORDER — INSULIN ASPART 100 UNIT/ML IJ SOLN
0.0000 [IU] | Freq: Every day | INTRAMUSCULAR | Status: DC
  Administered 2024-02-08: 5 [IU] via SUBCUTANEOUS

## 2024-02-08 NOTE — Progress Notes (Signed)
 PROGRESS NOTE   Carolyn DERHAMMER  AOZ:[id number]    DOB: [birth year]    DOA: 02/07/2024  PCP: Dickey Fought, PA   I have briefly reviewed patients previous medical records in [facility].   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, anxiety/depression/bipolar disorder, endometrial carcinoma supposed to stop XRT after most recent hospital admission, type II DM, HLD, HTN, tobacco abuse, prior substance abuse, confused at baseline, routinely refuses medications at her facility prompting multiple hospital admissions for DKA, just recent hospitalization 5/27 - 5/31 for recurrent DKA, dehydration, AKI and febrile illness with acute metabolic encephalopathy, palliative care consulted during that admission, communicated extensively with patient's father/HCPOA and patient was discharged back to LTC with recommendations of DNR comfort care, hospice at LTC, no rehospitalization, stop XRT and focus on comfort.  However, it appears that on 6/2, patient was sent from LTC to cancer center for XRT anyway, noted to have bilateral upper extremity swelling and cyanosis and directed to ED for admission.  Admitting MD communicated with Dr. Glady Laming, palliative care medicine director, hospice liaison from [facility].  Reportedly patient was yet to be enrolled in hospice but due to some insurance issues, this had not happened yet.  Due to unsafe discharge disposition and lack of clear plan, patient was admitted and comfort measures initiated.   Assessment & Plan:   Goals of care Please refer to recent DC summary by this MD on 5/31 and progress note by Dr. Flora Humphreys, palliative care director on 5/30 and above for details Admitting MD clarified goals of care with patient's father who is her healthcare power of attorney and patient was admitted for comfort care while TOC sought out the  hospice issue noted above Continue comfort care measures.  Anasarca Bilateral upper extremity pitting edema.  Likely multifactorial due to recent hospitalization with IV fluid resuscitation for DKA, hypoalbuminemia etc. Upper limbs are warm and perfused without any cyanosis today.  DM 2 Dysphagia Endometrial cancer Bipolar disorder/anxiety Only on SSI. Labile CBGs.  To 87 earlier this morning and then dropped to 68 midday.  Resuscitated with orange juice. Changed moderate SSI to sensitive. Stop XRT as discussed above.  Body mass index is 29.48 kg/m.   DVT prophylaxis: SCDs Start: 02/07/24 1708     Code Status: Do not attempt resuscitation (DNR) - Comfort care:  Family Communication: None at bedside Disposition:  Status is: Observation The patient remains OBS appropriate and will d/c before 2 midnights.  No inpatient intensity.     Consultants:     Procedures:     Subjective:  Alert and oriented to self.  Smiling.  Not saying much.  Tolerated 75% of lunch it appears.  Objective:   Vitals:   02/08/24 0500 02/08/24 0648 02/08/24 1000 02/08/24 1410  BP:   97/63 129/64  Pulse:   (!) 102 (!) 105  Resp:   17 16  Temp:   98.7 F (37.1 C) 98.9 F (37.2 C)  TempSrc:   Oral Oral  SpO2:   95% 95%  Weight: 77.9 kg     Height:  5\' 4"  (1.626 m)      General exam: Young female, moderately built and obese lying comfortably supine in bed without distress. Respiratory system: Clear to auscultation. Respiratory effort normal. Cardiovascular system: S1 & S2 heard, RRR. No JVD, murmurs, rubs, gallops or clicks. No pedal edema. Gastrointestinal system: Abdomen is nondistended,  soft and nontender. No organomegaly or masses felt. Normal bowel sounds heard. Central nervous system: Alert and oriented to self only. No focal neurological deficits. Extremities: Bilateral upper extremity pitting edema.  Extremities warm and perfused.  Good bilateral radial pulses felt.  No cyanosis noted.   Does have some bruising. Skin: No rashes, lesions or ulcers Psychiatry: Judgement and insight impaired. Mood & affect appropriate.     Data Reviewed:   I have personally reviewed following labs and imaging studies   CBC: Recent Labs  Lab 02/03/24 0314 02/04/24 0327 02/07/24 1235  WBC 9.1 7.8 3.8*  NEUTROABS  --   --  1.7  HGB 10.9* 11.7* 14.2  HCT 33.4* 37.1 43.6  MCV 89.5 92.1 92.0  PLT 263 PLATELET CLUMPS NOTED ON SMEAR, UNABLE TO ESTIMATE 287    Basic Metabolic Panel: Recent Labs  Lab 02/02/24 0644 02/02/24 1944 02/03/24 0314 02/04/24 0327 02/05/24 1011 02/07/24 1235  NA 144 142 139 139 141 135  K 3.4* 3.2* 3.3* 4.0 3.9 3.5  CL 102 103 102 104 104 96*  CO2 26 27 27 27 27 26   GLUCOSE 123* 177* 100* 145* 119* 199*  BUN 22* 19 17 16 10 9   CREATININE 1.66* 1.29* 1.31* 1.14* 0.93 0.89  CALCIUM  8.5* 8.0* 7.9* 8.1* 8.0* 8.7*  MG 1.7 1.5*  --  1.9  --   --     Liver Function Tests: Recent Labs  Lab 02/02/24 0328 02/07/24 1647  AST 66* 16  ALT 20 13  ALKPHOS 57 56  BILITOT 0.7 0.9  PROT 5.5* 5.0*  ALBUMIN 2.5* 2.0*    CBG: Recent Labs  Lab 02/08/24 0732 02/08/24 1208 02/08/24 1234  GLUCAP 287* 68* 201*    Microbiology Studies:   Recent Results (from the past 240 hours)  Blood Culture (routine x 2)     Status: Abnormal   Collection Time: 02/01/24  9:49 AM   Specimen: BLOOD  Result Value Ref Range Status   Specimen Description   Final    BLOOD LEFT ANTECUBITAL Performed at [facility], [address], [place], [place] [address]    Special Requests   Final    BOTTLES DRAWN AEROBIC ONLY Blood Culture results may not be optimal due to an inadequate volume of blood received in culture bottles Performed at [facility], [address] [place], [place] [address]    Culture  Setup Time   Final    GRAM POSITIVE COCCI IN CLUSTERS AEROBIC BOTTLE ONLY CRITICAL RESULT CALLED TO, READ BACK BY AND VERIFIED WITH:  PHARMD DREW WOFFORD ON 02/02/24 @ 1748 BY DRT    Culture (A)  Final    STAPHYLOCOCCUS EPIDERMIDIS THE SIGNIFICANCE OF ISOLATING THIS ORGANISM FROM A SINGLE SET OF BLOOD CULTURES WHEN MULTIPLE SETS ARE DRAWN IS UNCERTAIN. PLEASE NOTIFY THE MICROBIOLOGY DEPARTMENT WITHIN ONE WEEK IF SPECIATION AND SENSITIVITIES ARE REQUIRED. Performed at [facility], [address] [place], [place] [address]    Report Status 02/03/2024 FINAL  Final  Blood Culture ID Panel (Reflexed)     Status: Abnormal   Collection Time: 02/01/24  9:49 AM  Result Value Ref Range Status   Enterococcus faecalis NOT DETECTED NOT DETECTED Final   Enterococcus Faecium NOT DETECTED NOT DETECTED Final   Listeria monocytogenes NOT DETECTED NOT DETECTED Final   Staphylococcus species DETECTED (A) NOT DETECTED Final    Comment: CRITICAL RESULT CALLED TO, READ BACK BY AND VERIFIED WITH: PHARMD DREW WOFFORD ON  02/02/24 @ 1748 BY DRT    Staphylococcus aureus (BCID) NOT DETECTED NOT DETECTED Final   Staphylococcus epidermidis DETECTED (A) NOT DETECTED Final    Comment: Methicillin (oxacillin) resistant coagulase negative staphylococcus. Possible blood culture contaminant (unless isolated from more than one blood culture draw or clinical case suggests pathogenicity). No antibiotic treatment is indicated for blood  culture contaminants. CRITICAL RESULT CALLED TO, READ BACK BY AND VERIFIED WITH: PHARMD DREW WOFFORD ON 02/02/24 @ 1748 BY DRT    Staphylococcus lugdunensis NOT DETECTED NOT DETECTED Final   Streptococcus species NOT DETECTED NOT DETECTED Final   Streptococcus agalactiae NOT DETECTED NOT DETECTED Final   Streptococcus pneumoniae NOT DETECTED NOT DETECTED Final   Streptococcus pyogenes NOT DETECTED NOT DETECTED Final   A.calcoaceticus-baumannii NOT DETECTED NOT DETECTED Final   Bacteroides fragilis NOT DETECTED NOT DETECTED Final   Enterobacterales NOT DETECTED NOT DETECTED Final   Enterobacter cloacae complex NOT  DETECTED NOT DETECTED Final   Escherichia coli NOT DETECTED NOT DETECTED Final   Klebsiella aerogenes NOT DETECTED NOT DETECTED Final   Klebsiella oxytoca NOT DETECTED NOT DETECTED Final   Klebsiella pneumoniae NOT DETECTED NOT DETECTED Final   Proteus species NOT DETECTED NOT DETECTED Final   Salmonella species NOT DETECTED NOT DETECTED Final   Serratia marcescens NOT DETECTED NOT DETECTED Final   Haemophilus influenzae NOT DETECTED NOT DETECTED Final   Neisseria meningitidis NOT DETECTED NOT DETECTED Final   Pseudomonas aeruginosa NOT DETECTED NOT DETECTED Final   Stenotrophomonas maltophilia NOT DETECTED NOT DETECTED Final   Candida albicans NOT DETECTED NOT DETECTED Final   Candida auris NOT DETECTED NOT DETECTED Final   Candida glabrata NOT DETECTED NOT DETECTED Final   Candida krusei NOT DETECTED NOT DETECTED Final   Candida parapsilosis NOT DETECTED NOT DETECTED Final   Candida tropicalis NOT DETECTED NOT DETECTED Final   Cryptococcus neoformans/gattii NOT DETECTED NOT DETECTED Final   Methicillin resistance mecA/C DETECTED (A) NOT DETECTED Final    Comment: CRITICAL RESULT CALLED TO, READ BACK BY AND VERIFIED WITH: PHARMD DREW WOFFORD ON 02/02/24 @ 1748 BY DRT Performed at [facility], [address] [place], [place] [address]   Resp panel by RT-PCR (RSV, Flu A&B, Covid) Anterior Nasal Swab     Status: None   Collection Time: 02/01/24 10:10 AM   Specimen: Anterior Nasal Swab  Result Value Ref Range Status   SARS Coronavirus 2 by RT PCR NEGATIVE NEGATIVE Final    Comment: (NOTE) SARS-CoV-2 target nucleic acids are NOT DETECTED.  The SARS-CoV-2 RNA is generally detectable in upper respiratory specimens during the acute phase of infection. The lowest concentration of SARS-CoV-2 viral copies this assay can detect is 138 copies/mL. A negative result does not preclude SARS-Cov-2 infection and should not be used as the sole basis for treatment or other patient management  decisions. A negative result may occur with  improper specimen collection/handling, submission of specimen other than nasopharyngeal swab, presence of viral mutation(s) within the areas targeted by this assay, and inadequate number of viral copies(<138 copies/mL). A negative result must be combined with clinical observations, patient history, and epidemiological information. The expected result is Negative.  Fact Sheet for Patients:  BloggerCourse.com  Fact Sheet for Healthcare Providers:  SeriousBroker.it  This test is no t yet approved or cleared by the [place]  FDA and  has been authorized for detection and/or diagnosis of SARS-CoV-2 by FDA under an Emergency Use Authorization (EUA). This EUA will remain  in effect (meaning  this test can be used) for the duration of the COVID-19 declaration under Section 564(b)(1) of the Act, 21 U.S.C.section 360bbb-3(b)(1), unless the authorization is terminated  or revoked sooner.       Influenza A by PCR NEGATIVE NEGATIVE Final   Influenza B by PCR NEGATIVE NEGATIVE Final    Comment: (NOTE) The Xpert Xpress SARS-CoV-2/FLU/RSV plus assay is intended as an aid in the diagnosis of influenza from Nasopharyngeal swab specimens and should not be used as a sole basis for treatment. Nasal washings and aspirates are unacceptable for Xpert Xpress SARS-CoV-2/FLU/RSV testing.  Fact Sheet for Patients: BloggerCourse.com  Fact Sheet for Healthcare Providers: SeriousBroker.it  This test is not yet approved or cleared by the [place]  FDA and has been authorized for detection and/or diagnosis of SARS-CoV-2 by FDA under an Emergency Use Authorization (EUA). This EUA will remain in effect (meaning this test can be used) for the duration of the COVID-19 declaration under Section 564(b)(1) of the Act, 21 U.S.C. section 360bbb-3(b)(1), unless the  authorization is terminated or revoked.     Resp Syncytial Virus by PCR NEGATIVE NEGATIVE Final    Comment: (NOTE) Fact Sheet for Patients: BloggerCourse.com  Fact Sheet for Healthcare Providers: SeriousBroker.it  This test is not yet approved or cleared by the [place]  FDA and has been authorized for detection and/or diagnosis of SARS-CoV-2 by FDA under an Emergency Use Authorization (EUA). This EUA will remain in effect (meaning this test can be used) for the duration of the COVID-19 declaration under Section 564(b)(1) of the Act, 21 U.S.C. section 360bbb-3(b)(1), unless the authorization is terminated or revoked.  Performed at [facility], [address] [place], [place] [address]   Urine Culture     Status: None   Collection Time: 02/01/24  4:00 PM   Specimen: Urine, Random  Result Value Ref Range Status   Specimen Description   Final    URINE, RANDOM Performed at [facility], [address], [place], [place] [address]    Special Requests   Final    NONE Reflexed from [id number] Performed at [facility], [address], [place], [place] [address]    Culture   Final    NO GROWTH Performed at [facility], [address], [place], [place] [address]    Report Status 02/02/2024 FINAL  Final  Blood Culture (routine x 2)     Status: None   Collection Time: 02/01/24  7:55 PM   Specimen: BLOOD RIGHT ARM  Result Value Ref Range Status   Specimen Description   Final    BLOOD RIGHT ARM Performed at [facility], [address] [place], [place] [address]    Special Requests   Final    BOTTLES DRAWN AEROBIC AND ANAEROBIC Blood Culture results may not be optimal due to an inadequate volume of blood received in culture bottles Performed at [facility], [address] [place], [place] [address]    Culture   Final    NO GROWTH 5  DAYS Performed at [facility], [address], [place], [place] [address]    Report Status 02/06/2024 FINAL  Final  MRSA Next Gen by PCR, Nasal     Status: None   Collection Time: 02/02/24  6:20 AM   Specimen: Nasal Mucosa; Nasal Swab  Result Value Ref Range Status   MRSA by PCR Next Gen NOT DETECTED NOT DETECTED Final    Comment: (  NOTE) The GeneXpert MRSA Assay (FDA approved for NASAL specimens only), is one component of a comprehensive MRSA colonization surveillance program. It is not intended to diagnose MRSA infection nor to guide or monitor treatment for MRSA infections. Test performance is not FDA approved in patients less than 47 years old. Performed at [facility], [address], [place], [place] [address]   Respiratory (~20 pathogens) panel by PCR     Status: None   Collection Time: 02/02/24  6:21 AM   Specimen: Nasopharyngeal Swab; Respiratory  Result Value Ref Range Status   Adenovirus NOT DETECTED NOT DETECTED Final   Coronavirus 229E NOT DETECTED NOT DETECTED Final    Comment: (NOTE) The Coronavirus on the Respiratory Panel, DOES NOT test for the novel  Coronavirus (2019 nCoV)    Coronavirus HKU1 NOT DETECTED NOT DETECTED Final   Coronavirus NL63 NOT DETECTED NOT DETECTED Final   Coronavirus OC43 NOT DETECTED NOT DETECTED Final   Metapneumovirus NOT DETECTED NOT DETECTED Final   Rhinovirus / Enterovirus NOT DETECTED NOT DETECTED Final   Influenza A NOT DETECTED NOT DETECTED Final   Influenza B NOT DETECTED NOT DETECTED Final   Parainfluenza Virus 1 NOT DETECTED NOT DETECTED Final   Parainfluenza Virus 2 NOT DETECTED NOT DETECTED Final   Parainfluenza Virus 3 NOT DETECTED NOT DETECTED Final   Parainfluenza Virus 4 NOT DETECTED NOT DETECTED Final   Respiratory Syncytial Virus NOT DETECTED NOT DETECTED Final   Bordetella pertussis NOT DETECTED NOT DETECTED Final   Bordetella Parapertussis NOT DETECTED NOT DETECTED Final   Chlamydophila  pneumoniae NOT DETECTED NOT DETECTED Final   Mycoplasma pneumoniae NOT DETECTED NOT DETECTED Final    Comment: Performed at [facility], [address] [place], [place] [address]    Radiology Studies:  No results found.  Scheduled Meds:    insulin  aspart  0-15 Units Subcutaneous TID WC   insulin  aspart  0-5 Units Subcutaneous QHS    Continuous Infusions:    dextrose  5 % and 0.9 % NaCl 20 mL/hr at 02/07/24 2031     LOS: 0 days     Aubrey Blas, MD,  FACP, [facility], [facility], [facility]   [facility] Advisor [place]      To contact the attending provider between 7A-7P or the covering provider during after hours 7P-7A, please log into the web site [email or web address] and access using universal  password for that web site. If you do not have the password, please call the hospital operator.  02/08/2024, 3:03 PM

## 2024-02-08 NOTE — TOC Initial Note (Signed)
 Transition of Care [facility]) - Initial/Assessment Note    Patient Details  Name: Carolyn Norton MRN: [id number] Date of Birth: Dec 02, 1965  [facility] CM/SW Contact:    Amaryllis Junior, LCSW Phone Number: 02/08/2024, 4:01 PM  Clinical Narrative:                 Pt from [facility] for LTC. Plan is for pt to return to [place] will [facility] following for hospice services. [employer] and [employer] resolved issues regarding pt insurance and pt is able to dc back to [place] in the morning.   Expected Discharge Plan: Long Term Nursing Home Barriers to Discharge: No Barriers Identified   Patient Goals and CMS Choice Patient states their goals for this hospitalization and ongoing recovery are:: return to LTC   Choice offered to / list presented to : NA      Expected Discharge Plan and Services In-house Referral: NA                       DME Arranged: N/A DME Agency: NA       HH Arranged: NA HH Agency: NA        Prior Living Arrangements/Services       Do you feel safe going back to the place where you live?: Yes      Need for Family Participation in Patient Care: Yes (Comment) Care giver support system in place?: Yes (comment)   Criminal Activity/Legal Involvement Pertinent to Current Situation/Hospitalization: No - Comment as needed  Activities of Daily Living   ADL Screening (condition at time of admission) Independently performs ADLs?: No Does the patient have a NEW difficulty with bathing/dressing/toileting/self-feeding that is expected to last >3 days?: No Does the patient have a NEW difficulty with getting in/out of bed, walking, or climbing stairs that is expected to last >3 days?: No Does the patient have a NEW difficulty with communication that is expected to last >3 days?: No Is the patient deaf or have difficulty hearing?: No Does the patient have difficulty seeing, even when wearing glasses/contacts?: No Does the patient have difficulty  concentrating, remembering, or making decisions?: Yes  Permission Sought/Granted                  Emotional Assessment         Alcohol / Substance Use: Not Applicable Psych Involvement: No (comment)  Admission diagnosis:  Endometrial cancer (HCC) [C54.1] Acute metabolic encephalopathy [G93.41] Patient Active Problem List   Diagnosis Date Noted   Comfort measures only status 02/07/2024   Septic shock (HCC) 02/01/2024   Acute hypoxic respiratory failure (HCC) 01/14/2024   Acute encephalopathy 01/14/2024   Endometrial cancer (HCC) 01/04/2024   Diabetic ketoacidosis (HCC) 12/23/2023   (Idiopathic) normal pressure hydrocephalus (HCC) 05/29/2022   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) 05/29/2022   Essential hypertension 02/25/2022   Elevated blood-pressure reading, without diagnosis of hypertension 07/29/2021   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 04/29/2021   Protein-calorie malnutrition, severe 03/23/2021   Controlled type 2 diabetes mellitus with hyperglycemia, with long-term current use of insulin  (HCC) 02/11/2021   NPH (normal pressure hydrocephalus) (HCC)    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    Bipolar 1 disorder, depressed, full remission (HCC) 01/18/2021   Generalized weakness 01/11/2021   Acute metabolic encephalopathy 10/22/2020   Hypotension 10/22/2020   Type 2 diabetes mellitus with hyperlipidemia (HCC) 10/22/2020   Altered mental status  Hyperosmolar hyperglycemic state (HHS) (HCC)    Hyperglycemia    Hyperkalemia    Acute kidney injury (HCC)    Hypertension    DKA (diabetic ketoacidosis) (HCC) 09/06/2020   COVID-19 09/06/2020   Bipolar 1 disorder (HCC) 09/06/2020   Breast mass status post excision 09/06/2020   Smoker 09/06/2020   Metabolic acidosis due to ingestion of drugs or chemicals 09/06/2020   PCP:  Dickey Fought, PA Pharmacy:   [facility] [place] - [place], [place] - [address] [place] [address] Phone: [phone] Fax: [phone]     Social Drivers of Health (SDOH) Social History: SDOH Screenings   Food Insecurity: Patient Unable To Answer (02/08/2024)  Housing: Patient Unable To Answer (02/08/2024)  Transportation Needs: Patient Unable To Answer (02/08/2024)  Utilities: Patient Unable To Answer (02/08/2024)  Depression (PHQ2-9): Low Risk  (07/24/2020)  Tobacco Use: High Risk (02/08/2024)   SDOH Interventions:     Readmission Risk Interventions    02/05/2024    3:06 PM 12/27/2023   10:26 AM  Readmission Risk Prevention Plan  Transportation Screening Complete Complete  PCP or Specialist Appt within 5-7 Days  Complete  Home Care Screening  Complete  Medication Review (RN CM)  Complete  Medication Review (RN Care Manager) Complete   PCP or Specialist appointment within 3-5 days of discharge Complete   HRI or Home Care Consult Complete   SW Recovery Care/Counseling Consult Complete   Palliative Care Screening Complete   Skilled Nursing Facility Complete

## 2024-02-08 NOTE — Care Management Obs Status (Signed)
 MEDICARE OBSERVATION STATUS NOTIFICATION   Patient Details  Name: Carolyn Norton MRN: [id number] Date of Birth: 12/20/1965   Medicare Observation Status Notification Given:  Yes    Amaryllis Junior, LCSW 02/08/2024, 2:47 PM

## 2024-02-08 NOTE — Plan of Care (Signed)
   Problem: Education: Goal: Ability to describe self-care measures that may prevent or decrease complications (Diabetes Survival Skills Education) will improve Outcome: Progressing Goal: Individualized Educational Video(s) Outcome: Progressing   Problem: Coping: Goal: Ability to adjust to condition or change in health will improve Outcome: Progressing

## 2024-02-08 NOTE — Plan of Care (Signed)
  Problem: Education: Goal: Ability to describe self-care measures that may prevent or decrease complications (Diabetes Survival Skills Education) will improve Outcome: Not Progressing Goal: Individualized Educational Video(s) Outcome: Not Progressing   Problem: Coping: Goal: Ability to adjust to condition or change in health will improve Outcome: Not Progressing   Problem: Fluid Volume: Goal: Ability to maintain a balanced intake and output will improve Outcome: Not Progressing   Problem: Metabolic: Goal: Ability to maintain appropriate glucose levels will improve Outcome: Not Progressing   Problem: Skin Integrity: Goal: Risk for impaired skin integrity will decrease Outcome: Not Progressing   Problem: Nutritional: Goal: Maintenance of adequate nutrition will improve Outcome: Not Progressing Goal: Progress toward achieving an optimal weight will improve Outcome: Not Progressing

## 2024-02-09 ENCOUNTER — Ambulatory Visit

## 2024-02-09 DIAGNOSIS — C541 Malignant neoplasm of endometrium: Principal | ICD-10-CM

## 2024-02-09 LAB — GLUCOSE, CAPILLARY
Glucose-Capillary: 94 mg/dL (ref 70–99)
Glucose-Capillary: 366 mg/dL — ABNORMAL HIGH (ref 70–99)

## 2024-02-09 MED ORDER — ACETAMINOPHEN 325 MG PO TABS: 650.0000 mg | ORAL_TABLET | Freq: Four times a day (QID) | ORAL | Status: AC | PRN

## 2024-02-09 MED ORDER — CLONAZEPAM 1 MG PO TBDP: 1.0000 mg | ORAL_TABLET | Freq: Two times a day (BID) | ORAL | 0 refills | Status: AC

## 2024-02-09 MED ORDER — LORAZEPAM 2 MG/ML PO CONC: 1.0000 mg | ORAL | 0 refills | Status: AC | PRN

## 2024-02-09 MED ORDER — BISACODYL 10 MG RE SUPP: 10.0000 mg | Freq: Every day | RECTAL | 0 refills | Status: AC | PRN

## 2024-02-09 MED ORDER — MORPHINE SULFATE (CONCENTRATE) 10 MG /0.5 ML PO SOLN: 5.0000 mg | ORAL | 0 refills | Status: AC | PRN

## 2024-02-09 MED ORDER — SENNA 8.6 MG PO TABS: 1.0000 | ORAL_TABLET | Freq: Every evening | ORAL | 0 refills | Status: AC | PRN

## 2024-02-09 NOTE — Radiation Completion Notes (Signed)
  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 She was subsequently admitted to the hospital and discharged to her long-term care facility with hospice in place.   We appreicate the opportunity to take part in this patient's care. No role for further radiation follow-up at this time.      Amiel Kalata, PA-C

## 2024-02-09 NOTE — Progress Notes (Addendum)
 Report called to receiving RN at [facility]. Pt's father notified of discharge back to facility. Paper rx for ativan  and klonopin  signed and placed in pt's discharge packet.

## 2024-02-09 NOTE — Inpatient Diabetes Management (Signed)
 Inpatient Diabetes Program Recommendations  AACE/ADA: New Consensus Statement on Inpatient Glycemic Control (2015)  Target Ranges:  Prepandial:   less than 140 mg/dL      Peak postprandial:   less than 180 mg/dL (1-2 hours)      Critically ill patients:  140 - 180 mg/dL   Lab Results  Component Value Date   GLUCAP 94 02/09/2024   HGBA1C 11.3 (H) 12/24/2023    Review of Glycemic Control  Latest Reference Range & Units 02/08/24 07:32 02/08/24 12:08 02/08/24 12:34 02/08/24 15:56 02/08/24 20:54 02/09/24 07:51  Glucose-Capillary 70 - 99 mg/dL 161 (H) 68 (L) 096 (H) 311 (H) 361 (H) 94  (H): Data is abnormally high (L): Data is abnormally low Diabetes history: Type 2 DM Outpatient Diabetes medications: Novolog  0-9 units TID, Lantus  10 units QHS Current orders for Inpatient glycemic control: Novolog  0-9 units TID & HS  Inpatient Diabetes Program Recommendations:    Noted hypoglycemia of 68 mg/dL following 8 units of Novolog . While remaining inpatient consider decreasing Novolog  to 0-6 units TID. Of note, patient has a history of DKA admission.   Thanks, Marjo Sievert, MSN, RNC-OB Diabetes Coordinator [phone] (8a-5p)

## 2024-02-09 NOTE — NC FL2 (Signed)
 [place]  MEDICAID FL2 LEVEL OF CARE FORM     IDENTIFICATION  Patient Name: Carolyn Norton Birthdate: [birth year] Sex: female Admission Date (Current Location): 02/07/2024  [facility] and [place] Number:  Producer, television/film/video and Address:  [facility],  [address], [place] [address]      Provider Number: [id number]  Attending Physician Name and Address:  Sedalia Dacosta, MD  Relative Name and Phone Number:  Elianne, Gubser (Father)  [phone] [facility])    Current Level of Care: Hospital Recommended Level of Care: Nursing Facility Prior Approval Number:    Date Approved/Denied:   PASRR Number:    Discharge Plan: Other (Comment) (LTC)    Current Diagnoses: Patient Active Problem List   Diagnosis Date Noted   Comfort measures only status 02/07/2024   Septic shock (HCC) 02/01/2024   Acute hypoxic respiratory failure (HCC) 01/14/2024   Acute encephalopathy 01/14/2024   Endometrial cancer (HCC) 01/04/2024   Diabetic ketoacidosis (HCC) 12/23/2023   (Idiopathic) normal pressure hydrocephalus (HCC) 05/29/2022   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) 05/29/2022   Essential hypertension 02/25/2022   Elevated blood-pressure reading, without diagnosis of hypertension 07/29/2021   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 04/29/2021   Protein-calorie malnutrition, severe 03/23/2021   Controlled type 2 diabetes mellitus with hyperglycemia, with long-term current use of insulin  (HCC) 02/11/2021   NPH (normal pressure hydrocephalus) (HCC)    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    Bipolar 1 disorder, depressed, full remission (HCC) 01/18/2021   Generalized weakness 01/11/2021   Acute metabolic encephalopathy 10/22/2020   Hypotension 10/22/2020   Type 2 diabetes mellitus with hyperlipidemia (HCC) 10/22/2020   Altered mental status    Hyperosmolar hyperglycemic state (HHS) (HCC)    Hyperglycemia    Hyperkalemia     Acute kidney injury (HCC)    Hypertension    DKA (diabetic ketoacidosis) (HCC) 09/06/2020   COVID-19 09/06/2020   Bipolar 1 disorder (HCC) 09/06/2020   Breast mass status post excision 09/06/2020   Smoker 09/06/2020   Metabolic acidosis due to ingestion of drugs or chemicals 09/06/2020    Orientation RESPIRATION BLADDER Height & Weight     Self, Place  Normal External catheter Weight: 171 lb 11.8 oz (77.9 kg) Height:  5\' 4"  (162.6 cm)  BEHAVIORAL SYMPTOMS/MOOD NEUROLOGICAL BOWEL NUTRITION STATUS      Incontinent Diet (regular)  AMBULATORY STATUS COMMUNICATION OF NEEDS Skin   Limited Assist Verbally Normal                       Personal Care Assistance Level of Assistance  Bathing, Feeding, Dressing Bathing Assistance: Limited assistance Feeding assistance: Limited assistance Dressing Assistance: Limited assistance     Functional Limitations Info  Speech, Hearing, Sight Sight Info: Impaired Hearing Info: Adequate Speech Info: Adequate    SPECIAL CARE FACTORS FREQUENCY                       Contractures Contractures Info: Not present    Additional Factors Info  Code Status, Allergies Code Status Info: DNR Allergies Info: Erythromycin           Current Medications (02/09/2024):  This is the current hospital active medication list Current Facility-Administered Medications  Medication Dose Route Frequency Provider Last Rate Last Admin   acetaminophen  (TYLENOL ) tablet 650 mg  650 mg Oral Q6H PRN Amin, Ankit C, MD       Or   acetaminophen  (TYLENOL )  suppository 650 mg  650 mg Rectal Q6H PRN Amin, Ankit C, MD       albuterol  (PROVENTIL ) (2.5 MG/3ML) 0.083% nebulizer solution 2.5 mg  2.5 mg Nebulization Q2H PRN Amin, Ankit C, MD       baclofen (LIORESAL) tablet 5 mg  5 mg Oral Q12H PRN Amin, Ankit C, MD       bisacodyl  (DULCOLAX) suppository 10 mg  10 mg Rectal Daily PRN Amin, Ankit C, MD       insulin  aspart (novoLOG ) injection 0-5 Units  0-5 Units  Subcutaneous QHS Hongalgi, Anand D, MD   5 Units at 02/08/24 2101   insulin  aspart (novoLOG ) injection 0-9 Units  0-9 Units Subcutaneous TID WC Hongalgi, Anand D, MD   7 Units at 02/08/24 1808   LORazepam  (ATIVAN ) tablet 1 mg  1 mg Oral Q4H PRN Amin, Ankit C, MD       Or   LORazepam  (ATIVAN ) 2 MG/ML concentrated solution 1 mg  1 mg Sublingual Q4H PRN Amin, Ankit C, MD       Or   LORazepam  (ATIVAN ) injection 1 mg  1 mg Intravenous Q4H PRN Amin, Ankit C, MD       magic mouthwash  15 mL Oral Q6H PRN Amin, Ankit C, MD       morphine  (PF) 2 MG/ML injection 1-4 mg  1-4 mg Intravenous Q15 min PRN Amin, Ankit C, MD       morphine  CONCENTRATE 10 mg / 0.5 ml oral solution 5 mg  5 mg Oral Q2H PRN Amin, Ankit C, MD       Or   morphine  CONCENTRATE 10 mg / 0.5 ml oral solution 5 mg  5 mg Sublingual Q2H PRN Amin, Ankit C, MD       ondansetron  (ZOFRAN -ODT) disintegrating tablet 4 mg  4 mg Oral Q6H PRN Amin, Ankit C, MD       Or   ondansetron  (ZOFRAN ) injection 4 mg  4 mg Intravenous Q6H PRN Amin, Ankit C, MD       senna (SENOKOT) tablet 8.6 mg  1 tablet Oral QHS PRN Amin, Ankit C, MD       sodium phosphate  (FLEET) enema 1 enema  1 enema Rectal Daily PRN Amin, Ankit C, MD         Discharge Medications: Please see discharge summary for a list of discharge medications.  Relevant Imaging Results:  Relevant Lab Results:   Additional Information SSN [id number]  Amaryllis Junior, LCSW

## 2024-02-09 NOTE — Plan of Care (Signed)
   Problem: Education: Goal: Ability to describe self-care measures that may prevent or decrease complications (Diabetes Survival Skills Education) will improve Outcome: Progressing Goal: Individualized Educational Video(s) Outcome: Progressing   Problem: Coping: Goal: Ability to adjust to condition or change in health will improve Outcome: Progressing

## 2024-02-09 NOTE — TOC Transition Note (Signed)
 Transition of Care [facility]) - Discharge Note   Patient Details  Name: Carolyn Norton MRN: [id number] Date of Birth: 10/09/1965  [facility]) CM/SW Contact:  Loreda Rodriguez, RN Phone [phone]  02/09/2024, 12:17 PM   Clinical Narrative:    CM received handoff that patient is discharging back to [facility]. Discharge summary & transfer report have been faxed to facility. Transport has been arranged via [facility]. D/c packet is at nurses station in chart. Nurse to update patients father. No other TOC needs noted. Nurse has been given info to call report.    Final next level of care: Long Term Nursing Home Barriers to Discharge: No Barriers Identified   Patient Goals and CMS Choice Patient states their goals for this hospitalization and ongoing recovery are:: return to LTC   Choice offered to / list presented to : NA      Discharge Placement                       Discharge Plan and Services Additional resources added to the After Visit Summary for   In-house Referral: NA              DME Arranged: N/A DME Agency: NA       HH Arranged: NA HH Agency: NA        Social Drivers of Health (SDOH) Interventions SDOH Screenings   Food Insecurity: Patient Unable To Answer (02/08/2024)  Housing: Patient Unable To Answer (02/08/2024)  Transportation Needs: Patient Unable To Answer (02/08/2024)  Utilities: Patient Unable To Answer (02/08/2024)  Depression (PHQ2-9): Low Risk  (07/24/2020)  Tobacco Use: High Risk (02/08/2024)     Readmission Risk Interventions    02/05/2024    3:06 PM 12/27/2023   10:26 AM  Readmission Risk Prevention Plan  Transportation Screening Complete Complete  PCP or Specialist Appt within 5-7 Days  Complete  Home Care Screening  Complete  Medication Review (RN CM)  Complete  Medication Review (RN Care Manager) Complete   PCP or Specialist appointment within 3-5 days of discharge Complete   HRI or Home Care Consult Complete    SW Recovery Care/Counseling Consult Complete   Palliative Care Screening Complete   Skilled Nursing Facility Complete

## 2024-02-09 NOTE — Discharge Summary (Signed)
 Physician Discharge Summary  Carolyn Norton ZOX:[id number] DOB: Jan 10, 1966 DOA: 02/07/2024  PCP: Dickey Fought, PA  Admit date: 02/07/2024 Discharge date: 02/09/2024 Discharging to: Long-term care facility [facility] Recommendations for Outpatient Follow-up:  Hospice ([facility]) to follow inpatient to not be readmitted to the hospital  Consults:  Case discussed with [facility] RN, Jenness Mock     Discharge Diagnoses:   Principal Problem:   Endometrial cancer (HCC) Active Problems:   Comfort measures only status   Acute metabolic encephalopathy   Type 2 diabetes mellitus with hyperlipidemia [facility])   Essential hypertension   Bipolar 1 disorder [facility])     Hospital Course:  This is a 58 year old female with endometrial carcinoma (has been receiving radiation treatment), NPH with VP shunt, possible Alzheimer's disease,  bipolar disorder, diabetes mellitus with recurrent admissions for DKA due to nonadherence and refusal of medications, hypertension, tobacco abuse, prior history of substance abuse who presented to the hospital on 6/2 from radiation oncology suite for bilateral upper extremity edema and cyanosis.  The patient was discharged from the hospital on 5/31 back to her long-term care facility with a plan to initiate hospice, stop radiation treatments for her cancer and, not be readmitted to the hospital.  Unfortunately, before the hospice team could evaluate the patient at the LTC facility, she was sent back to the cancer center for a radiation treatment where she was found to have swollen and cyanotic upper extremities.  Further discussions confirmed that she was supposed to be on comfort care. She will now be returning to her long-term care facility with hospice in place.       Discharge Instructions   Allergies as of 02/09/2024       Reactions   Erythromycin Other (See Comments)   "Childhood reaction"- ALLERGIC        Medication List     STOP  taking these medications    amoxicillin -clavulanate 875-125 MG tablet Commonly known as: AUGMENTIN   atorvastatin  20 MG tablet Commonly known as: LIPITOR   Cholecalciferol  25 MCG (1000 UT) tablet   fluconazole  150 MG tablet Commonly known as: DIFLUCAN    Gerhardt's butt cream Crea   leptospermum manuka honey Pste paste       TAKE these medications    acetaminophen  325 MG tablet Commonly known as: TYLENOL  Take 2 tablets (650 mg total) by mouth every 6 (six) hours as needed for mild pain (pain score 1-3) or fever (or Fever >/= 101).   albuterol  108 (90 Base) MCG/ACT inhaler Commonly known as: VENTOLIN  HFA Inhale 2 puffs into the lungs every 6 (six) hours as needed for wheezing or shortness of breath.   bisacodyl  10 MG suppository Commonly known as: DULCOLAX Place 1 suppository (10 mg total) rectally daily as needed for moderate constipation.   clonazePAM  1 MG disintegrating tablet Commonly known as: KLONOPIN  Take 1 tablet (1 mg total) by mouth 2 (two) times daily.   divalproex  500 MG DR tablet Commonly known as: DEPAKOTE  Take 500 mg by mouth 3 (three) times daily.   docusate sodium  100 MG capsule Commonly known as: COLACE Take 1 capsule (100 mg total) by mouth 2 (two) times daily as needed for mild constipation. What changed: when to take this   guaiFENesin 100 MG/5ML liquid Commonly known as: ROBITUSSIN Take 5 mLs by mouth every 4 (four) hours as needed for cough.   insulin  aspart 100 UNIT/ML injection Commonly known as: novoLOG  Inject 0-9 Units into the skin 3 (three) times daily with meals.  CBG < 70: Implement Hypoglycemia protocol; CBG 70 - 120: 0 units CBG 121 - 150: 1 unit CBG 151 - 200: 2 units CBG 201 - 250: 3 units CBG 251 - 300: 5 units CBG 301 - 350: 7 units CBG 351 - 400: 9 units CBG > 400: call MD.   lamoTRIgine  100 MG 24 hour tablet Commonly known as: LAMICTAL  XR Take 100 mg by mouth in the morning and at bedtime.   Lantus  SoloStar 100 UNIT/ML  Solostar Pen Generic drug: insulin  glargine Inject 10 Units into the skin at bedtime. What changed: Another medication with the same name was removed. Continue taking this medication, and follow the directions you see here.   LORazepam  2 MG/ML concentrated solution Commonly known as: ATIVAN  Place 0.5 mLs (1 mg total) under the tongue every 4 (four) hours as needed for anxiety.   morphine  CONCENTRATE 10 mg / 0.5 ml concentrated solution Take 0.25-0.5 mLs (5-10 mg total) by mouth every 2 (two) hours as needed for moderate pain (pain score 4-6) or severe pain (pain score 7-10) (or dyspnea).   multivitamin with minerals tablet Take 1 tablet by mouth daily.   nystatin  powder Apply 1 Application topically See admin instructions. Apply to groin area 2 times a day- through 02/20/2024   ondansetron  4 MG tablet Commonly known as: ZOFRAN  Take 4 mg by mouth every 6 (six) hours as needed for nausea or vomiting (dissolve in 5 ml's of water - if needed).   pantoprazole  40 MG tablet Commonly known as: PROTONIX  Take 40 mg by mouth daily.   polyethylene glycol 17 g packet Commonly known as: MIRALAX  / GLYCOLAX  Take 17 g by mouth daily.   senna 8.6 MG Tabs tablet Commonly known as: SENOKOT Take 1 tablet (8.6 mg total) by mouth at bedtime as needed for mild constipation.            The results of significant diagnostics from this hospitalization (including imaging, microbiology, ancillary and laboratory) are listed below for reference.    DG Chest Port 1 View Result Date: 02/01/2024 CLINICAL DATA:  Possible sepsis EXAM: PORTABLE CHEST 1 VIEW COMPARISON:  01/13/2024 FINDINGS: Atherosclerotic calcification of the aortic arch. The lungs appear clear. Tubing projects over the right chest possibly a VP shunt. Old healed right rib fractures. Cardiac and mediastinal margins appear normal. IMPRESSION: 1. No acute findings. 2. Aortic Atherosclerosis (ICD10-I70.0). Electronically Signed   By: Freida Jes M.D.   On: 02/01/2024 12:00   CT Head Wo Contrast Result Date: 02/01/2024 CLINICAL DATA:  58 year old female with delirium. EXAM: CT HEAD WITHOUT CONTRAST TECHNIQUE: Contiguous axial images were obtained from the base of the skull through the vertex without intravenous contrast. RADIATION DOSE REDUCTION: This exam was performed according to the departmental dose-optimization program which includes automated exposure control, adjustment of the mA and/or kV according to patient size and/or use of iterative reconstruction technique. COMPARISON:  Head CT 01/15/2024.  Brain MRI 04/01/2021. FINDINGS: Brain: Right posterior approach ventriculostomy catheter is stable terminating in the midline, communicating with the atrium of the right lateral ventricle as before. Stable ventricle size and configuration. Stable mild encephalomalacia along the course of the catheter, chronic right superior frontal gyrus encephalomalacia. No midline shift, mass effect, evidence of mass lesion, intracranial hemorrhage or evidence of cortically based acute infarction. Vascular: Calcified atherosclerosis at the skull base. No suspicious intracranial vascular hyperdensity. Skull: Stable right posterior skull burr hole. Intact otherwise. No acute osseous abnormality identified. Sinuses/Orbits: Visualized paranasal sinuses and mastoids are  stable and well aerated. Other: Stable right posterior approach CSF shunt reservoir and tubing. Rightward gaze. IMPRESSION: 1. No acute intracranial abnormality. 2. Stable right posterior approach CSF shunt and ventricle size. Patchy chronic right hemisphere encephalomalacia. Electronically Signed   By: Marlise Simpers M.D.   On: 02/01/2024 11:17   VAS US  UPPER EXTREMITY VENOUS DUPLEX Result Date: 01/15/2024 UPPER VENOUS STUDY  Patient Name:  RUNA WHITTINGHAM  Date of Exam:   01/15/2024 Medical Rec #: [id number]           Accession #:    [id number] Date of Birth: 1966/04/13          Patient Gender: F  Patient Age:   40 years Exam Location:  [facility] Procedure:      VAS US  UPPER EXTREMITY VENOUS DUPLEX Referring Phys: Corky Diener RATHORE --------------------------------------------------------------------------------  Indications: Left upper extremity pain, swelling, erythema status post IV infiltration Limitations: Poor ultrasound/tissue interface and patient unable to extend and rotate arm. Comparison Study: No prior study Performing Technologist: Carleene Chase RVS  Examination Guidelines: A complete evaluation includes B-mode imaging, spectral Doppler, color Doppler, and power Doppler as needed of all accessible portions of each vessel. Bilateral testing is considered an integral part of a complete examination. Limited examinations for reoccurring indications may be performed as noted.  Right Findings: +----------+------------+---------+-----------+----------+-------+ RIGHT     CompressiblePhasicitySpontaneousPropertiesSummary +----------+------------+---------+-----------+----------+-------+ Subclavian               Yes       Yes                      +----------+------------+---------+-----------+----------+-------+  Left Findings: +----------+------------+---------+-----------+----------+-------+ LEFT      CompressiblePhasicitySpontaneousPropertiesSummary +----------+------------+---------+-----------+----------+-------+ IJV           Full       Yes       Yes                      +----------+------------+---------+-----------+----------+-------+ Subclavian    Full       Yes       Yes                      +----------+------------+---------+-----------+----------+-------+ Axillary      Full       Yes       Yes                      +----------+------------+---------+-----------+----------+-------+ Brachial      Full                                          +----------+------------+---------+-----------+----------+-------+ Radial        Full                                           +----------+------------+---------+-----------+----------+-------+ Ulnar         Full                                          +----------+------------+---------+-----------+----------+-------+ Cephalic      Full                                          +----------+------------+---------+-----------+----------+-------+  Basilic       Full                                          +----------+------------+---------+-----------+----------+-------+  Summary:  Right: No evidence of superficial vein thrombosis in the upper extremity.  Left: No evidence of deep vein thrombosis in the upper extremity. No evidence of superficial vein thrombosis in the upper extremity.  *See table(s) above for measurements and observations.  Diagnosing physician: Angela Kell MD Electronically signed by Angela Kell MD on 01/15/2024 at 8:04:16 PM.    Final    CT HEAD CODE STROKE WO CONTRAST` Result Date: 01/15/2024 CLINICAL DATA:  Code stroke. EXAM: CT HEAD WITHOUT CONTRAST TECHNIQUE: Contiguous axial images were obtained from the base of the skull through the vertex without intravenous contrast. RADIATION DOSE REDUCTION: This exam was performed according to the departmental dose-optimization program which includes automated exposure control, adjustment of the mA and/or kV according to patient size and/or use of iterative reconstruction technique. COMPARISON:  Prior study from 12/24/2023 FINDINGS: Brain: Atrophy with moderate chronic microvascular ischemic disease. Chronic right frontal encephalomalacia. Right parietal approach shunt catheter in place with tip terminating at the septum pellucidum. Stable ventricular size and morphology without progressive hydrocephalus. No acute intracranial hemorrhage. No acute large vessel territory infarct. No mass lesion or midline shift. No extra-axial fluid collection. Vascular: No abnormal hyperdense vessel. Scattered vascular calcifications noted  within the carotid siphons. Skull: Scalp soft tissues demonstrate no acute finding. Calvarium intact. Sinuses/Orbits: Globes and orbital soft tissues within normal limits. Paranasal sinuses are largely clear. No mastoid effusion. Other: None. ASPECTS [facility] Stroke Program Early CT Score) - Ganglionic level infarction (caudate, lentiform nuclei, internal capsule, insula, M1-M3 cortex): 7 - Supraganglionic infarction (M4-M6 cortex): 3 Total score (0-10 with 10 being normal): 10 IMPRESSION: 1. No acute intracranial abnormality. 2. ASPECTS is 10. 3. Right parietal approach VP shunt catheter in place. Stable ventricular size and morphology without progressive hydrocephalus. 4. Underlying atrophy with chronic small vessel ischemic disease, with chronic right frontal encephalomalacia. These results were communicated to choose for at 1:08 am on 01/15/2024 by text page via the [facility] messaging system. Electronically Signed   By: Virgia Griffins M.D.   On: 01/15/2024 01:09   US  RENAL Result Date: 01/14/2024 CLINICAL DATA:  Acute kidney injury EXAM: RENAL / URINARY TRACT ULTRASOUND COMPLETE COMPARISON:  None Available. FINDINGS: Right Kidney: Renal measurements: 9.2 x 4.2 x 4.1 cm = volume: 82 mL. Somewhat small volume but no renal cortical thinning. Echogenicity within normal limits. No mass or hydronephrosis visualized. Left Kidney: Renal measurements: 9.6 x 4.5 x 4.5 cm = volume: 102 mL. Echogenicity within normal limits. No mass or hydronephrosis visualized. Bladder: Appears normal for degree of bladder distention. IMPRESSION: No hydronephrosis, mass, or cortical thinning. Electronically Signed   By: Ronnette Coke M.D.   On: 01/14/2024 05:24   DG Chest Port 1 View Result Date: 01/13/2024 CLINICAL DATA:  Shortness of breath and hyperglycemia, altered mental status EXAM: PORTABLE CHEST 1 VIEW COMPARISON:  12/23/2023 FINDINGS: Stable cardiomediastinal silhouette. Aortic atherosclerotic calcification. Left basilar  and right mid lung atelectasis. Low lung volumes accentuate pulmonary vascularity. No pleural effusion or pneumothorax. Partially visualized right chest VP shunt. IMPRESSION: Low lung volumes with bilateral atelectasis. Electronically Signed   By: Rozell Cornet M.D.   On: 01/13/2024 22:40   Labs:   Basic Metabolic Panel:  Recent Labs  Lab 02/02/24 1944 02/03/24 0314 02/04/24 0327 02/05/24 1011 02/07/24 1235  NA 142 139 139 141 135  K 3.2* 3.3* 4.0 3.9 3.5  CL 103 102 104 104 96*  CO2 27 27 27 27 26   GLUCOSE 177* 100* 145* 119* 199*  BUN 19 17 16 10 9   CREATININE 1.29* 1.31* 1.14* 0.93 0.89  CALCIUM  8.0* 7.9* 8.1* 8.0* 8.7*  MG 1.5*  --  1.9  --   --      CBC: Recent Labs  Lab 02/03/24 0314 02/04/24 0327 02/07/24 1235  WBC 9.1 7.8 3.8*  NEUTROABS  --   --  1.7  HGB 10.9* 11.7* 14.2  HCT 33.4* 37.1 43.6  MCV 89.5 92.1 92.0  PLT 263 PLATELET CLUMPS NOTED ON SMEAR, UNABLE TO ESTIMATE 287         SIGNED:   Sedalia Dacosta, MD  [facility] 02/09/2024, 11:29 AM Time taking on discharge: 50 minutes

## 2024-02-10 ENCOUNTER — Ambulatory Visit

## 2024-02-11 ENCOUNTER — Ambulatory Visit

## 2024-02-14 ENCOUNTER — Ambulatory Visit

## 2024-02-15 ENCOUNTER — Ambulatory Visit

## 2024-02-16 ENCOUNTER — Ambulatory Visit

## 2024-02-17 ENCOUNTER — Ambulatory Visit

## 2024-02-18 ENCOUNTER — Ambulatory Visit

## 2024-02-21 ENCOUNTER — Ambulatory Visit

## 2024-02-22 ENCOUNTER — Ambulatory Visit

## 2024-02-23 ENCOUNTER — Ambulatory Visit

## 2024-02-24 ENCOUNTER — Ambulatory Visit

## 2024-02-25 ENCOUNTER — Ambulatory Visit

## 2024-02-28 ENCOUNTER — Ambulatory Visit

## 2024-02-29 ENCOUNTER — Ambulatory Visit

## 2024-03-01 ENCOUNTER — Ambulatory Visit

## 2024-03-02 ENCOUNTER — Ambulatory Visit

## 2024-03-03 ENCOUNTER — Encounter

## 2024-03-03 ENCOUNTER — Ambulatory Visit

## 2024-03-06 ENCOUNTER — Ambulatory Visit

## 2024-03-07 ENCOUNTER — Encounter

## 2024-03-07 ENCOUNTER — Ambulatory Visit

## 2024-03-07 DEATH — deceased

## 2024-03-08 ENCOUNTER — Ambulatory Visit

## 2024-03-08 ENCOUNTER — Encounter

## 2024-03-09 ENCOUNTER — Ambulatory Visit

## 2024-03-09 ENCOUNTER — Encounter

## 2024-03-10 ENCOUNTER — Ambulatory Visit

## 2024-03-13 ENCOUNTER — Encounter

## 2024-03-13 ENCOUNTER — Ambulatory Visit

## 2024-03-14 ENCOUNTER — Encounter

## 2024-03-14 ENCOUNTER — Ambulatory Visit

## 2024-03-15 ENCOUNTER — Ambulatory Visit
